# Patient Record
Sex: Male | Born: 1945
Health system: Southern US, Community
[De-identification: ages and names within clinical notes are randomized; demographics above are authoritative.]

## PROBLEM LIST (undated history)

## (undated) DIAGNOSIS — E119 Type 2 diabetes mellitus without complications: Secondary | ICD-10-CM

## (undated) DIAGNOSIS — E785 Hyperlipidemia, unspecified: Secondary | ICD-10-CM

## (undated) DIAGNOSIS — E1140 Type 2 diabetes mellitus with diabetic neuropathy, unspecified: Secondary | ICD-10-CM

## (undated) DIAGNOSIS — F320 Major depressive disorder, single episode, mild: Secondary | ICD-10-CM

## (undated) DIAGNOSIS — I639 Cerebral infarction, unspecified: Secondary | ICD-10-CM

## (undated) DIAGNOSIS — M1990 Unspecified osteoarthritis, unspecified site: Secondary | ICD-10-CM

## (undated) DIAGNOSIS — G4730 Sleep apnea, unspecified: Secondary | ICD-10-CM

## (undated) DIAGNOSIS — K5903 Drug induced constipation: Secondary | ICD-10-CM

## (undated) DIAGNOSIS — I10 Essential (primary) hypertension: Secondary | ICD-10-CM

## (undated) DIAGNOSIS — I2510 Atherosclerotic heart disease of native coronary artery without angina pectoris: Secondary | ICD-10-CM

## (undated) DIAGNOSIS — C61 Malignant neoplasm of prostate: Secondary | ICD-10-CM

## (undated) DIAGNOSIS — F419 Anxiety disorder, unspecified: Secondary | ICD-10-CM

## (undated) DIAGNOSIS — F32A Depression, unspecified: Secondary | ICD-10-CM

## (undated) DIAGNOSIS — I219 Acute myocardial infarction, unspecified: Secondary | ICD-10-CM

## (undated) DIAGNOSIS — IMO0001 Reserved for inherently not codable concepts without codable children: Secondary | ICD-10-CM

## (undated) DIAGNOSIS — Z720 Tobacco use: Secondary | ICD-10-CM

## (undated) HISTORY — PX: BACK SURGERY: SHX140

## (undated) HISTORY — PX: CARDIAC CATHETERIZATION: SHX172

## (undated) HISTORY — DX: Major depressive disorder, single episode, mild: F32.0

## (undated) HISTORY — PX: PROSTATE BIOPSY: SHX241

## (undated) HISTORY — DX: Atherosclerotic heart disease of native coronary artery without angina pectoris: I25.10

## (undated) HISTORY — DX: Morbid (severe) obesity due to excess calories: E66.01

## (undated) HISTORY — DX: Essential (primary) hypertension: I10

## (undated) HISTORY — DX: Hyperlipidemia, unspecified: E78.5

## (undated) HISTORY — DX: Depression, unspecified: F32.A

## (undated) HISTORY — DX: Cerebral infarction, unspecified: I63.9

## (undated) HISTORY — PX: VASECTOMY: SHX75

## (undated) HISTORY — DX: Tobacco use: Z72.0

## (undated) HISTORY — DX: Malignant neoplasm of prostate: C61

## (undated) HISTORY — DX: Type 2 diabetes mellitus without complications: E11.9

---

## 2000-04-20 HISTORY — PX: LUMBAR SPINE SURGERY: SHX701

## 2000-08-05 ENCOUNTER — Encounter: Payer: Self-pay | Admitting: Neurological Surgery

## 2000-08-09 ENCOUNTER — Inpatient Hospital Stay (HOSPITAL_COMMUNITY): Admission: RE | Admit: 2000-08-09 | Discharge: 2000-08-10 | Payer: Self-pay | Admitting: Neurological Surgery

## 2000-08-09 ENCOUNTER — Encounter: Payer: Self-pay | Admitting: Neurological Surgery

## 2000-09-03 ENCOUNTER — Encounter: Payer: Self-pay | Admitting: Neurological Surgery

## 2000-09-03 ENCOUNTER — Encounter: Admission: RE | Admit: 2000-09-03 | Discharge: 2000-09-03 | Payer: Self-pay | Admitting: Neurological Surgery

## 2004-02-26 ENCOUNTER — Ambulatory Visit (HOSPITAL_COMMUNITY): Admission: RE | Admit: 2004-02-26 | Discharge: 2004-02-26 | Payer: Self-pay | Admitting: *Deleted

## 2004-02-26 ENCOUNTER — Ambulatory Visit: Payer: Self-pay | Admitting: *Deleted

## 2004-03-11 ENCOUNTER — Ambulatory Visit: Payer: Self-pay | Admitting: *Deleted

## 2004-03-25 ENCOUNTER — Ambulatory Visit (HOSPITAL_COMMUNITY): Admission: RE | Admit: 2004-03-25 | Discharge: 2004-03-25 | Payer: Self-pay | Admitting: *Deleted

## 2004-03-31 ENCOUNTER — Ambulatory Visit: Payer: Self-pay | Admitting: *Deleted

## 2004-04-01 ENCOUNTER — Inpatient Hospital Stay (HOSPITAL_BASED_OUTPATIENT_CLINIC_OR_DEPARTMENT_OTHER): Admission: RE | Admit: 2004-04-01 | Discharge: 2004-04-01 | Payer: Self-pay | Admitting: *Deleted

## 2004-04-04 ENCOUNTER — Ambulatory Visit (HOSPITAL_COMMUNITY): Admission: RE | Admit: 2004-04-04 | Discharge: 2004-04-05 | Payer: Self-pay | Admitting: Cardiology

## 2004-04-04 ENCOUNTER — Ambulatory Visit: Payer: Self-pay | Admitting: Cardiology

## 2005-02-27 ENCOUNTER — Inpatient Hospital Stay (HOSPITAL_COMMUNITY): Admission: EM | Admit: 2005-02-27 | Discharge: 2005-03-02 | Payer: Self-pay | Admitting: Emergency Medicine

## 2005-02-27 ENCOUNTER — Ambulatory Visit: Payer: Self-pay | Admitting: Internal Medicine

## 2005-03-05 ENCOUNTER — Ambulatory Visit: Payer: Self-pay | Admitting: *Deleted

## 2005-03-05 ENCOUNTER — Ambulatory Visit (HOSPITAL_COMMUNITY): Admission: RE | Admit: 2005-03-05 | Discharge: 2005-03-05 | Payer: Self-pay | Admitting: *Deleted

## 2005-03-16 ENCOUNTER — Ambulatory Visit: Payer: Self-pay | Admitting: *Deleted

## 2006-02-17 ENCOUNTER — Ambulatory Visit: Payer: Self-pay | Admitting: Cardiology

## 2006-08-26 ENCOUNTER — Ambulatory Visit: Payer: Self-pay | Admitting: Cardiology

## 2006-09-28 ENCOUNTER — Encounter (INDEPENDENT_AMBULATORY_CARE_PROVIDER_SITE_OTHER): Payer: Self-pay | Admitting: *Deleted

## 2006-09-28 LAB — CONVERTED CEMR LAB
ALT: 10 units/L
AST: 15 units/L
Albumin: 4.2 g/dL
Alkaline Phosphatase: 66 units/L
BUN: 13 mg/dL
CO2: 24 meq/L
Calcium: 8.8 mg/dL
Chloride: 103 meq/L
Cholesterol: 187 mg/dL
Creatinine, Ser: 0.96 mg/dL
Glucose, Bld: 92 mg/dL
HDL: 49 mg/dL
LDL Cholesterol: 123 mg/dL
Potassium: 4.5 meq/L
Sodium: 140 meq/L
TSH: 1.184 microintl units/mL
Total Protein: 6.9 g/dL
Triglycerides: 74 mg/dL

## 2006-12-22 ENCOUNTER — Ambulatory Visit (HOSPITAL_COMMUNITY): Admission: RE | Admit: 2006-12-22 | Discharge: 2006-12-22 | Payer: Self-pay | Admitting: Urology

## 2006-12-29 ENCOUNTER — Encounter (INDEPENDENT_AMBULATORY_CARE_PROVIDER_SITE_OTHER): Payer: Self-pay | Admitting: *Deleted

## 2006-12-29 ENCOUNTER — Ambulatory Visit (HOSPITAL_COMMUNITY): Admission: RE | Admit: 2006-12-29 | Discharge: 2006-12-29 | Payer: Self-pay | Admitting: Urology

## 2007-01-12 ENCOUNTER — Ambulatory Visit: Payer: Self-pay | Admitting: Cardiology

## 2007-02-16 ENCOUNTER — Encounter: Payer: Self-pay | Admitting: Urology

## 2007-02-19 HISTORY — PX: PROSTATECTOMY: SHX69

## 2007-02-21 ENCOUNTER — Encounter (INDEPENDENT_AMBULATORY_CARE_PROVIDER_SITE_OTHER): Payer: Self-pay | Admitting: Urology

## 2007-02-21 ENCOUNTER — Inpatient Hospital Stay (HOSPITAL_COMMUNITY): Admission: RE | Admit: 2007-02-21 | Discharge: 2007-02-23 | Payer: Self-pay | Admitting: Urology

## 2008-05-22 ENCOUNTER — Encounter: Payer: Self-pay | Admitting: Nurse Practitioner

## 2008-05-29 ENCOUNTER — Ambulatory Visit (HOSPITAL_COMMUNITY): Admission: RE | Admit: 2008-05-29 | Discharge: 2008-05-29 | Payer: Self-pay | Admitting: Internal Medicine

## 2008-06-28 DIAGNOSIS — Z951 Presence of aortocoronary bypass graft: Secondary | ICD-10-CM | POA: Insufficient documentation

## 2008-06-28 HISTORY — PX: CORONARY ARTERY BYPASS GRAFT: SHX141

## 2008-07-16 ENCOUNTER — Encounter: Admission: RE | Admit: 2008-07-16 | Discharge: 2008-07-16 | Payer: Self-pay | Admitting: Internal Medicine

## 2009-01-17 ENCOUNTER — Encounter: Payer: Self-pay | Admitting: Nurse Practitioner

## 2009-02-22 ENCOUNTER — Encounter (INDEPENDENT_AMBULATORY_CARE_PROVIDER_SITE_OTHER): Payer: Self-pay | Admitting: *Deleted

## 2009-03-21 ENCOUNTER — Ambulatory Visit: Payer: Self-pay | Admitting: Gastroenterology

## 2009-03-21 DIAGNOSIS — R1084 Generalized abdominal pain: Secondary | ICD-10-CM | POA: Insufficient documentation

## 2009-03-21 DIAGNOSIS — C61 Malignant neoplasm of prostate: Secondary | ICD-10-CM | POA: Insufficient documentation

## 2009-04-02 ENCOUNTER — Telehealth: Payer: Self-pay | Admitting: Gastroenterology

## 2009-04-05 ENCOUNTER — Encounter (INDEPENDENT_AMBULATORY_CARE_PROVIDER_SITE_OTHER): Payer: Self-pay | Admitting: *Deleted

## 2009-04-08 ENCOUNTER — Ambulatory Visit: Payer: Self-pay | Admitting: Cardiology

## 2009-04-08 DIAGNOSIS — I2510 Atherosclerotic heart disease of native coronary artery without angina pectoris: Secondary | ICD-10-CM | POA: Insufficient documentation

## 2009-04-08 DIAGNOSIS — E785 Hyperlipidemia, unspecified: Secondary | ICD-10-CM | POA: Insufficient documentation

## 2009-04-08 DIAGNOSIS — R42 Dizziness and giddiness: Secondary | ICD-10-CM | POA: Insufficient documentation

## 2009-04-08 DIAGNOSIS — F17200 Nicotine dependence, unspecified, uncomplicated: Secondary | ICD-10-CM | POA: Insufficient documentation

## 2009-04-16 ENCOUNTER — Ambulatory Visit: Payer: Self-pay | Admitting: Gastroenterology

## 2009-04-22 ENCOUNTER — Encounter: Payer: Self-pay | Admitting: Gastroenterology

## 2010-03-28 ENCOUNTER — Encounter (INDEPENDENT_AMBULATORY_CARE_PROVIDER_SITE_OTHER): Payer: Self-pay | Admitting: *Deleted

## 2010-05-10 ENCOUNTER — Encounter: Payer: Self-pay | Admitting: *Deleted

## 2010-05-11 ENCOUNTER — Encounter: Payer: Self-pay | Admitting: Internal Medicine

## 2010-05-20 NOTE — Assessment & Plan Note (Signed)
Summary: SCREEN FOR COLON/PT ON PLAVIX/YF    History of Present Illness Visit Type: Initial Consult Primary GI MD: Melvia Heaps MD [facility] Primary Provider: Elfredia Nevins, MD Chief Complaint: c/o lower abdominal pain and constipation needs screening colonoscopy  on Plavix History of Present Illness:   Here for CRC screening. 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 Followed by Dr. Dietrich Pates in [place].  Had had recent chaneg in bowels. Used to one formed stool daily then over last month developed constipation defined as decreased urge. Recently went seven days without BM, took laxative and BMs back to baseline.  For two months having  burning across lower abdomen.  Usually occurs in evening when at rest, it is iintermittent throughoutt the night and has awoken him from sleep. Urinating okay.  Pain not related to meals or defecation.     GI Review of Systems    Reports abdominal pain and  weight gain.     Location of  Abdominal pain: lower abdomen.    Denies acid reflux, belching, bloating, chest pain, dysphagia with liquids, dysphagia with solids, heartburn, loss of appetite, nausea, vomiting, vomiting blood, and  weight loss.      Reports change in bowel habits.     Denies anal fissure, black tarry stools, constipation, diarrhea, diverticulosis, fecal incontinence, heme positive stool, hemorrhoids, irritable bowel syndrome, jaundice, light color stool, liver problems, rectal bleeding, and  rectal pain.    Current Medications (verified): 1)  Aspirin 325 Mg Tabs (Aspirin) .Marland Kitchen.. 1 By Mouth Once Daily 2)  Plavix 75 Mg Tabs (Clopidogrel Bisulfate) .Marland Kitchen.. 1 By Mouth Once Daily 3)  Avalide 300-25 Mg Tabs (Irbesartan-Hydrochlorothiazide) .Marland Kitchen.. 1 By Mouth Once Daily 4)  Lupron(Dosage Unknown) .... Once Every Three Months  Allergies (verified): No Known Drug Allergies  Past History:  Past Medical History: Reviewed history from 03/20/2009 and no changes  required. Hypertension prostate cancer per urology Hyperlipidemia morbid obesity mild depression coronary disease  Past Surgical History: status post back surgery status post vacetectomy Prostatectomy Nov. 2008  Family History: No FH of Colon Cancer: Family History of Kidney Disease: mother  Social History: Occupation: Risk analyst Married 3 boys 1 girl Patient is a former smoker. trying to quit now Alcohol Use - yes  occasionally Illicit Drug Use - no Daily Caffeine Use  3 per day Smoking Status:  quit Drug Use:  no  Review of Systems       The patient complains of anxiety-new, cough, depression-new, and fatigue.  The patient denies allergy/sinus, anemia, arthritis/joint pain, back pain, blood in urine, breast changes/lumps, change in vision, confusion, coughing up blood, fainting, fever, headaches-new, hearing problems, heart murmur, heart rhythm changes, itching, muscle pains/cramps, night sweats, nosebleeds, shortness of breath, skin rash, sleeping problems, sore throat, swelling of feet/legs, swollen lymph glands, thirst - excessive, urination - excessive, urination changes/pain, urine leakage, vision changes, and voice change.    Vital Signs:  Patient profile:   65 year old male Height:      74 inches Weight:      276 pounds BMI:     35.56 Pulse rate:   76 / minute Pulse rhythm:   regular BP sitting:   158 / 94  (left arm)  Vitals Entered By: Milford Cage NCMA (March 21, 2009 1:36 PM)  Physical Exam  General:  Well developed, well nourished, no acute distress. Head:  Normocephalic and atraumatic. Eyes:  Conjunctiva pink, no icterus.  Mouth:  No oral lesions.  Tongue moist.  Neck:  no obvious masses  Lungs:  Clear throughout to auscultation. Heart:  Regular rate and rhythm; no murmurs, rubs,  or bruits. Abdomen:  Abdomen soft, nontender, nondistended. No obvious masses or hepatomegaly.Normal bowel sounds.  Msk:  . Extremities:  No palmar  erythema, no edema.  Neurologic:  Alert and  oriented x4;  grossly normal neurologically. Skin:  Intact without significant lesions or rashes. Cervical Nodes:  No significant cervical adenopathy. Psych:  Alert and cooperative. Normal mood and affect.   Impression & Recommendations:  Problem # 1:  CHANGE IN BOWELS (ICD-787.99)  Associated with intermittent lower abdominal burning. The patient will be scheduled for a colonoscopy with biopsies/polypectomy (if indicated).  The risks and benefits of the procedure, as well as alternatives were discussed with the patient and she agrees to proceed.  Orders: Colonoscopy (Colon)  Problem # 2:  SCREENING COLORECTAL-CANCER (ICD-V76.51)  See # 1.   Orders: Colonoscopy (Colon)  Problem # 3:  CORONARY ARTERY DISEASE (ICD-414.00) Hx MI / Stents. four years ago. On Plavix. The patient will be scheduled for a colonoscopy with biopsies/polypectomy (if indicated).  The risks and benefits of the procedure, as well as alternatives were discussed with the patient and he agrees to proceed. Will contact cardiologist regarding Plavik and whether it can  be held.   Problem # 4:  DIZZINESS (ICD-780.4) Several week history of dizziness when moving between lying and sitting position. No chest pains or SOB. I have asked patient to contact PCP for further evaluation, I will forward my note to him as well.   Problem # 5:  ADENOCARCINOMA, PROSTATE (ICD-185) Prostatectomy 2008   Patient Instructions: 1)  Your Colonoscopy is scheduled for 04/16/2009 at 3pm 2)  You can pick up your MoviPrep today from your pharmacy 3)  We will contact Dr Polvadera Bing about coming off your Plavix 7 days prior to your procedure 4)  The medication list was reviewed and reconciled.  All changed / newly prescribed medications were explained.  A complete medication list was provided to the patient / caregiver. Prescriptions: MOVIPREP 100 GM  SOLR (PEG-KCL-NACL-NASULF-NA ASC-C) As per  prep instructions.  #1 x 0   Entered by:   Merri Ray CMA (AAMA)   Authorized by:   Louis Meckel MD   Signed by:   Merri Ray CMA (AAMA) on 03/21/2009   Method used:   Electronically to        [facility]  [phone]* (retail)       [address] [place], [place]  [address]       Ph: [phone] or [phone]       Fax: [phone]   RxID:   [id number]

## 2010-05-20 NOTE — Procedures (Signed)
Summary: Colonoscopy  Patient: [name] Note: All result statuses are Final unless otherwise noted.  Tests: (1) Colonoscopy (COL)   COL Colonoscopy           DONE (C)     [facility]     [address]     [place], [place]  [address]           COLONOSCOPY PROCEDURE REPORT           PATIENT:  [name]  MR#:  [id number]     BIRTHDATE:  11-29-1945, 63 yrs. old  GENDER:  male           ENDOSCOPIST:  Barbette Hair. Arlyce Dice, MD     Referred by:           PROCEDURE DATE:  04/16/2009     PROCEDURE:  Colonoscopy with snare polypectomy     ASA CLASS:  ClassII     INDICATIONS:  Routine Risk Screening           MEDICATIONS:   Fentanyl 100 mcg IV, Versed 10 mg IV           DESCRIPTION OF PROCEDURE:   After the risks benefits and     alternatives of the procedure were thoroughly explained, informed     consent was obtained.  Digital rectal exam was performed and     revealed no abnormalities.   The LB [id number] endoscope     was introduced through the anus and advanced to the cecum, which     was identified by the ileocecal valve, without limitations.  The     quality of the prep was good, using MoviPrep.  The instrument was     then slowly withdrawn as the colon was fully examined.     <<PROCEDUREIMAGES>>           FINDINGS:  A sessile polyp was found in the ascending colon. It     was 3 mm in size. Polyp was snared without cautery. Retrieval was     successful (see image6). snare polyp  Mild diverticulosis was     found in the sigmoid colon (see image1 and image14).  Internal     hemorrhoids were found (see image17).  This was otherwise a normal     examination of the colon (see image2, image4, image5, image9,     image10, image15, and image16).   Retroflexed views in the rectum     revealed no abnormalities.    The scope was then withdrawn from     the patient and the procedure completed.           COMPLICATIONS:  None           ENDOSCOPIC IMPRESSION:   1) 3 mm sessile polyp in the ascending colon     2) Moderate diverticulosis in the sigmoid colon     3) Internal hemorrhoids     4) Otherwise normal examination     RECOMMENDATIONS:     1) If the polyp(s) removed today are proven to be adenomatous     (pre-cancerous) polyps, you will need a repeat colonoscopy in 5     years. Otherwise you should continue to follow colorectal cancer     screening guidelines for "routine risk" patients with colonoscopy     in 10 years.     2) resume Plavix in 1 week     3) fiber supplementation daily     4) office visit 4-6  weeks           REPEAT EXAM: You will receive a letter from Dr. Arlyce Dice in 1-2     weeks, after reviewing the final pathology, with followup     recommendations.           ______________________________     Barbette Hair Arlyce Dice, MD           CC:  Elfredia Nevins, MD           n.     REVISED:  04/22/2009 02:22 PM     eSIGNED:   Barbette Hair. Annistyn Depass at 04/22/2009 02:22 PM           [name], [id number]  Note: An exclamation mark (!) indicates a result that was not dispersed into the flowsheet. Document Creation Date: 04/22/2009 2:22 PM _______________________________________________________________________  (1) Order result status: Final Collection or observation date-time: 04/16/2009 15:36 Requested date-time:  Receipt date-time:  Reported date-time:  Referring Physician:   Ordering Physician: Melvia Heaps [phone]) Specimen Source:  Source: Launa Grill Order Number: [id number] Lab site:   Appended Document: Colonoscopy     Procedures Next Due Date:    Colonoscopy: 03/2014

## 2010-05-20 NOTE — Progress Notes (Signed)
Summary: coming off plavix   Phone Note Outgoing Call Call back at Work Phone [phone]   Call placed by: Merri Ray CMA Duncan Dull),  April 02, 2009 8:51 AM Summary of Call: Called pt to inform that he needed to contact Dr Dietrich Pates to schedule a office appointment so he can come off his plavix before his procedure on Dec 28. Pt stated he would contact doctors office and call us back Initial call taken by: Merri Ray CMA Duncan Dull),  April 02, 2009 8:52 AM  Follow-up for Phone Call        Pt has an appointment with Dr Dietrich Pates on Monday at 3pm to follow up his Plavix. Will contact me on Monday to follow up. Follow-up by: Merri Ray CMA Duncan Dull),  April 05, 2009 4:34 PM

## 2010-05-20 NOTE — Letter (Signed)
Summary: Patient Notice- Polyp Results  [facility]  [address] [place], [place] [address]   Phone: [phone]  Fax: [phone]        April 22, 2009 MRN: [id number]    [name] [address] [place], [place]  [address]    Dear [name],  I am pleased to inform you that the colon polyp(s) removed during your recent colonoscopy was (were) found to be benign (no cancer detected) upon pathologic examination.  I recommend you have a repeat colonoscopy examination in 5_ years to look for recurrent polyps, as having colon polyps increases your risk for having recurrent polyps or even colon cancer in the future.  Should you develop new or worsening symptoms of abdominal pain, bowel habit changes or bleeding from the rectum or bowels, please schedule an evaluation with either your primary care physician or with me.  Additional information/recommendations:  __ No further action with gastroenterology is needed at this time. Please      follow-up with your primary care physician for your other healthcare      needs.  __ Please call [phone] to schedule a return visit to review your      situation.  __ Please keep your follow-up visit as already scheduled.  _x_ Continue treatment plan as outlined the day of your exam.  Please call us if you are having persistent problems or have questions about your condition that have not been fully answered at this time.  Sincerely,  Louis Meckel MD  This letter has been electronically signed by your physician.  Appended Document: Patient Notice- Polyp Results Letter mailed 01.04.11

## 2010-05-20 NOTE — Letter (Signed)
Summary: [facility]   Imported By: Lester [place] 03/27/2009 07:50:34  _____________________________________________________________________  External Attachment:    Type:   Image     Comment:   External Document

## 2010-05-20 NOTE — Letter (Signed)
Summary: [facility]   Imported By: Lester [place] 03/27/2009 07:54:13  _____________________________________________________________________  External Attachment:    Type:   Image     Comment:   External Document

## 2010-05-20 NOTE — Miscellaneous (Signed)
Summary: LABS CMP ,LIPID,TSH, 09/28/2006  Clinical Lists Changes  Observations: Added new observation of CALCIUM: 8.8 mg/dL (21/30/8657 8:46) Added new observation of ALBUMIN: 4.2 g/dL (96/29/5284 1:32) Added new observation of PROTEIN, TOT: 6.9 g/dL (44/04/270 5:36) Added new observation of SGPT (ALT): 10 units/L (09/28/2006 9:17) Added new observation of SGOT (AST): 15 units/L (09/28/2006 9:17) Added new observation of ALK PHOS: 66 units/L (09/28/2006 9:17) Added new observation of CREATININE: 0.96 mg/dL (64/40/3474 2:59) Added new observation of BUN: 13 mg/dL (56/38/7564 3:32) Added new observation of BG RANDOM: 92 mg/dL (95/18/8416 6:06) Added new observation of CO2 PLSM/SER: 24 meq/L (09/28/2006 9:17) Added new observation of CL SERUM: 103 meq/L (09/28/2006 9:17) Added new observation of K SERUM: 4.5 meq/L (09/28/2006 9:17) Added new observation of NA: 140 meq/L (09/28/2006 9:17) Added new observation of LDL: 123 mg/dL (30/16/0109 3:23) Added new observation of HDL: 49 mg/dL (55/73/2202 5:42) Added new observation of TRIGLYC TOT: 74 mg/dL (70/62/3762 8:31) Added new observation of CHOLESTEROL: 187 mg/dL (51/76/1607 3:71) Added new observation of TSH: 1.184 microintl units/mL (09/28/2006 9:17)

## 2010-05-20 NOTE — Letter (Signed)
Summary: [facility] Instructions  [facility]  [address] [place], [place] [address]   Phone: [phone]  Fax: [phone]       [name]    [birth year]    MRN: [id number]        Procedure Day /Date:TUESDAY 04/16/2009     Arrival Time:2PM     Procedure Time:3PM     Location of Procedure:                    [facility] (4th Floor)                        PREPARATION FOR COLONOSCOPY WITH MOVIPREP   Starting 5 days prior to your procedure12/23/2010 do not eat nuts, seeds, popcorn, corn, beans, peas,  salads, or any raw vegetables.  Do not take any fiber supplements (e.g. Metamucil, Citrucel, and Benefiber).  THE DAY BEFORE YOUR PROCEDURE         DATE: 04/15/2009  DAY: MONDAY  1.  Drink clear liquids the entire day-NO SOLID FOOD  2.  Do not drink anything colored red or purple.  Avoid juices with pulp.  No orange juice.  3.  Drink at least 64 oz. (8 glasses) of fluid/clear liquids during the day to prevent dehydration and help the prep work efficiently.  CLEAR LIQUIDS INCLUDE: Water Jello Ice Popsicles Tea (sugar ok, no milk/cream) Powdered fruit flavored drinks Coffee (sugar ok, no milk/cream) Gatorade Juice: apple, white grape, white cranberry  Lemonade Clear bullion, consomm, broth Carbonated beverages (any kind) Strained chicken noodle soup Hard Candy                             4.  In the morning, mix first dose of MoviPrep solution:    Empty 1 Pouch A and 1 Pouch B into the disposable container    Add lukewarm drinking water to the top line of the container. Mix to dissolve    Refrigerate (mixed solution should be used within 24 hrs)  5.  Begin drinking the prep at 5:00 p.m. The MoviPrep container is divided by 4 marks.   Every 15 minutes drink the solution down to the next mark (approximately 8 oz) until the full liter is complete.   6.  Follow completed prep with 16 oz of clear liquid of your choice (Nothing red  or purple).  Continue to drink clear liquids until bedtime.  7.  Before going to bed, mix second dose of MoviPrep solution:    Empty 1 Pouch A and 1 Pouch B into the disposable container    Add lukewarm drinking water to the top line of the container. Mix to dissolve    Refrigerate  THE DAY OF YOUR PROCEDURE      DATE: 04/16/2009 DAY: THURSDAY  Beginning at 10:00a.m. (5 hours before procedure):         1. Every 15 minutes, drink the solution down to the next mark (approx 8 oz) until the full liter is complete.  2. Follow completed prep with 16 oz. of clear liquid of your choice.    3. You may drink clear liquids until 1:00PM (2 HOURS BEFORE PROCEDURE).   MEDICATION INSTRUCTIONS  Unless otherwise instructed, you should take regular prescription medications with a small sip of water   as early as possible the morning of your procedure.  Diabetic patients - see separate instructions.  Stop taking Plavix or Aggrenox on 04/09/2009(7 days before procedure).     You will be contacted by our office prior to your procedure for directions on holding your Plavix.  If you do not hear from our office 1 week prior to your scheduled procedure, please call [phone] to discuss.       OTHER INSTRUCTIONS  You will need a responsible adult at least 65 years of age to accompany you and drive you home.   This person must remain in the waiting room during your procedure.  Wear loose fitting clothing that is easily removed.  Leave jewelry and other valuables at home.  However, you may wish to bring a book to read or  an iPod/MP3 player to listen to music as you wait for your procedure to start.  Remove all body piercing jewelry and leave at home.  Total time from sign-in until discharge is approximately 2-3 hours.  You should go home directly after your procedure and rest.  You can resume normal activities the  day after your procedure.  The day of your procedure you should not:    Drive   Make legal decisions   Operate machinery   Drink alcohol   Return to work  You will receive specific instructions about eating, activities and medications before you leave.    The above instructions have been reviewed and explained to me by   _______________________    I fully understand and can verbalize these instructions _____________________________ Date _________

## 2010-05-20 NOTE — Letter (Signed)
Summary: New Patient letter  [facility]  [address] [place], [place] [address]   Phone: [phone]  Fax: [phone]       02/22/2009 MRN: [id number]  [name] [address] [place], [place]  [address]  Dear [name],  Welcome to the Gastroenterology Division at [facility].    You are scheduled to see Dr.  Arlyce Dice on 03-21-2009 at 1:30pm on the 3rd floor at [facility], [address]  We ask that you try to arrive at our office 15 minutes prior to your appointment time to allow for check-in.  We would like you to complete the enclosed self-administered evaluation form prior to your visit and bring it with you on the day of your appointment.  We will review it with you.  Also, please bring a complete list of all your medications or, if you prefer, bring the medication bottles and we will list them.  Please bring your insurance card so that we may make a copy of it.  If your insurance requires a referral to see a specialist, please bring your referral form from your primary care physician.  Co-payments are due at the time of your visit and may be paid by cash, check or credit card.     Your office visit will consist of a consult with your physician (includes a physical exam), any laboratory testing he/she may order, scheduling of any necessary diagnostic testing (e.g. x-ray, ultrasound, CT-scan), and scheduling of a procedure (e.g. Endoscopy, Colonoscopy) if required.  Please allow enough time on your schedule to allow for any/all of these possibilities.    If you cannot keep your appointment, please call [phone] to cancel or reschedule prior to your appointment date.  This allows [place] the opportunity to schedule an appointment for another patient in need of care.  If you do not cancel or reschedule by 5 p.m. the business day prior to your appointment date, you will be charged a $50.00 late cancellation/no-show fee.    Thank you for choosing   Gastroenterology for your medical needs.  We appreciate the opportunity to care for you.  Please visit [place] at our website  to learn more about our practice.                     Sincerely,                                                             The Gastroenterology Division

## 2010-05-20 NOTE — Letter (Signed)
Summary: Appointment - Reminder 2  [facility] [address]   [place], [place] [address]   Phone: [phone]  Fax: [phone]     March 28, 2010 MRN: [id number]   [name] [address] [place], [place]  [address]   Dear [name],  Our records indicate that it is time to schedule a follow-up appointment.  Dr.ROTHBART             recommended that you follow up with [place] in  12.2011          . It is very important that we reach you to schedule this appointment. We look forward to participating in your health care needs. Please contact us at the number listed above at your earliest convenience to schedule your appointment.  If you are unable to make an appointment at this time, give [place] a call so we can update our records.     Sincerely,   Glass blower/designer

## 2010-05-20 NOTE — Assessment & Plan Note (Signed)
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  He required radical prostatectomy for carcinoma in 2008 and has been maintained on Lupron.  He has noted fatigue and weight gain, which she attributes to that medication.  He walks 2 miles per day, but has intermittent mild dyspnea on related to exertion.  He has had no chest discomfort.  He is not taking atorvastatin, but does not recall who, if anyone, discontinued it.  He continues to smoke cigarettes, but claims a consumption of only 2 cigarettes per day.  He notes dizziness when changing position.  This sounds like vertigo, but could represent lightheadedness as well.  Blood pressure control has been suboptimal.   Current Medications (verified): 1)  Aspirin 325 Mg Tabs (Aspirin) .Marland Kitchen.. 1 By Mouth Once Daily 2)  Plavix 75 Mg Tabs (Clopidogrel Bisulfate) .Marland Kitchen.. 1 By Mouth Once Daily 3)  Avalide 300-25 Mg Tabs (Irbesartan-Hydrochlorothiazide) .Marland Kitchen.. 1 By Mouth Once Daily 4)  Lupron(Dosage Unknown) .... Once Every Three Months 5)  Simvastatin 40 Mg Tabs (Simvastatin) .... Take One Tablet By Mouth Daily At Bedtime 6)  Amlodipine Besylate 5 Mg Tabs (Amlodipine Besylate) .... Take One Tablet By Mouth Daily  Allergies (verified): No Known Drug Allergies  Past History:  PMH, FH, and Social History reviewed and updated.  Review of Systems       The patient complains of weight gain.  The patient denies anorexia, fever, chest pain, syncope, peripheral edema,  prolonged cough, headaches, and abdominal pain.    Vital Signs:  Patient profile:   65 year old male Weight:      273 pounds Pulse rate:   79 / minute Pulse (ortho):   82 / minute BP sitting:   160 / 87  (right arm) BP standing:   161 / 98  Vitals Entered By: Dreama Saa, CNA (April 08, 2009 3:44 PM)  Serial Vital Signs/Assessments:  Time      Position  BP       Pulse  Resp  Temp     By 5:17 PM   Lying LA  157/89   73                    Tammy Sanders RN 5:17 PM   Sitting   153/90   76                    Tammy Sanders RN 5:17 PM   Standing  161/98   82                    Tammy Sanders RN   Physical Exam  General:   General-Well developed; no acute distress; overweight   Neck-No JVD; no carotid bruits Lungs-No tachypnea, no rales; no rhonchi; no wheezes: Cardiovascular-normal PMI; normal S1 and S2; S4 present Abdomen-BS normal; soft and non-tender without masses or organomegaly:  Musculoskeletal-No deformities, no cyanosis or clubbing: Neurologic-Normal cranial nerves; symmetric strength and tone:  Skin-Warm, no significant lesions: Extremities-Nl distal pulses; no edema:  Impression & Recommendations:  Problem # 1:  ATHEROSCLEROTIC CARDIOVASCULAR DISEASE (ICD-429.2) Currently no symptoms to suggest recurrent myocardial ischemia.   We will attempt to optimize his risk factor management.  Problem # 2:  HYPERLIPIDEMIA (ICD-272.4) With a history of coronary disease, treatment with a statin is virtually mandatory.  His last LDL was suboptimal at 123.  Simvastatin 40 mg q.d. will be added to his medical regime.  Problem # 3:  TOBACCO ABUSE (ICD-305.1) He claims minimal consumption of tobacco; however, abstinence would be preferable as discussed with him.  Problem # 4:  HYPERTENSION (ICD-401.1) Control of blood pressure has been lost, likely due to a 35 pound weight gain.  Amlodipine 5 mg q.d. will be added to his medical regime.  Problem # 5:  INTERMITTENT VERTIGO  (ICD-780.4) Orthostatic vital signs are normal-I doubt that the patient's symptoms reflect cerebral hypoperfusion.  He has been referred to an ENT specialist for further evaluation.  Problem # 6:  ADENOCARCINOMA, PROSTATE (ICD-185) Lupron is to be discontinued, which hopefully will allow for some weight loss.  This would be beneficial for his fatigue, dyspnea, hypertension and hyperlipidemia.  I will plan to reassess this nice gentleman in one year.  Patient Instructions: 1)  Your physician recommends that you schedule a follow-up appointment in: 1 YEAR 2)  Your physician has recommended you make the following change in your medication: START SIMVASTATIN 40MG  DAILY AND AMLODINPINE 5MG  DAILY 3)  Your physician encouraged you to lose weight for better health. 4)  You have been referred to ENT  REFERRAL-VERTIGO Prescriptions: AMLODIPINE BESYLATE 5 MG TABS (AMLODIPINE BESYLATE) Take one tablet by mouth daily  #90 x 3   Entered by:   Teressa Lower RN   Authorized by:   Kathlen Brunswick, MD, [facility]   Signed by:   Teressa Lower RN on 04/08/2009   Method used:   Electronically to        [facility]  [phone]* (retail)       [address] [place], [place]  [address]       Ph: [phone] or [phone]       Fax: [phone]   RxID:   [id number] SIMVASTATIN 40 MG TABS (SIMVASTATIN) Take one tablet by mouth daily at bedtime  #90 x 3   Entered by:   Teressa Lower RN   Authorized by:   Kathlen Brunswick, MD, [facility]   Signed by:   Teressa Lower RN on 04/08/2009   Method used:   Electronically to        [facility]  [phone]* (retail)       [address] [place], [place]  [address]       Ph: [phone] or [phone]       Fax: [phone]   RxID:   [id number]

## 2010-05-20 NOTE — Letter (Signed)
Summary: Anticoagulation Modification Letter  [facility]  [address] [place], [place] [address]   Phone: [phone]  Fax: [phone]    March 21, 2009  Re:    Daleen Bo DOB:    12-14-1945 MRN:    [id number]    Dear Dr Dietrich Pates:  We have scheduled the above patient for an endoscopic procedure. Our records show that  he/she is on anticoagulation therapy. Please advise as to how long the patient may come off their therapy of Plavix prior to the scheduled procedure(s) on 04/16/2009   Please fax back/or route the completed form to Robin at [phone]_.  Thank you for your help with this matter.  Sincerely,  Merri Ray CMA Duncan Dull)   Physician Recommendation:  Hold Plavix 7 days prior ________________  Hold Coumadin 5 days prior ____________  Other ______________________________     Appended Document: Anticoagulation Modification Letter Dr Dietrich Pates,  I know you signed the plavix letter but can you append on my note and actually type that its ok for the pt to come off his Plavix. Thank You  Appended Document: Anticoagulation Modification Letter I send Dr. Arlyce Dice and you a flag indicating that I have not seen this patient for 2 years and would need to evaluate him prior to answering this question.  [place] Bing, M.D.

## 2010-06-24 ENCOUNTER — Emergency Department (HOSPITAL_COMMUNITY)
Admission: EM | Admit: 2010-06-24 | Discharge: 2010-06-24 | Disposition: A | Discharge status: Home / Self Care | Payer: Managed Care, Other (non HMO) | Attending: Emergency Medicine | Admitting: Emergency Medicine

## 2010-06-24 DIAGNOSIS — Z951 Presence of aortocoronary bypass graft: Secondary | ICD-10-CM | POA: Insufficient documentation

## 2010-06-24 DIAGNOSIS — Z794 Long term (current) use of insulin: Secondary | ICD-10-CM | POA: Insufficient documentation

## 2010-06-24 DIAGNOSIS — E119 Type 2 diabetes mellitus without complications: Secondary | ICD-10-CM | POA: Insufficient documentation

## 2010-06-24 DIAGNOSIS — Z79899 Other long term (current) drug therapy: Secondary | ICD-10-CM | POA: Insufficient documentation

## 2010-06-24 DIAGNOSIS — I2510 Atherosclerotic heart disease of native coronary artery without angina pectoris: Secondary | ICD-10-CM | POA: Insufficient documentation

## 2010-06-24 DIAGNOSIS — I10 Essential (primary) hypertension: Secondary | ICD-10-CM | POA: Insufficient documentation

## 2010-06-24 DIAGNOSIS — R42 Dizziness and giddiness: Secondary | ICD-10-CM | POA: Insufficient documentation

## 2010-06-24 DIAGNOSIS — R55 Syncope and collapse: Secondary | ICD-10-CM | POA: Insufficient documentation

## 2010-06-24 DIAGNOSIS — Z7982 Long term (current) use of aspirin: Secondary | ICD-10-CM | POA: Insufficient documentation

## 2010-06-24 LAB — DIFFERENTIAL
Basophils Absolute: 0 10*3/uL (ref 0.0–0.1)
Basophils Relative: 1 % (ref 0–1)
Eosinophils Absolute: 0 10*3/uL (ref 0.0–0.7)
Eosinophils Relative: 0 % (ref 0–5)
Lymphocytes Relative: 27 % (ref 12–46)
Lymphs Abs: 1.6 10*3/uL (ref 0.7–4.0)
Monocytes Absolute: 0.5 10*3/uL (ref 0.1–1.0)
Monocytes Relative: 9 % (ref 3–12)
Neutro Abs: 3.6 10*3/uL (ref 1.7–7.7)
Neutrophils Relative %: 62 % (ref 43–77)

## 2010-06-24 LAB — URINALYSIS, ROUTINE W REFLEX MICROSCOPIC
Bilirubin Urine: NEGATIVE
Glucose, UA: NEGATIVE mg/dL
Hgb urine dipstick: NEGATIVE
Ketones, ur: NEGATIVE mg/dL
Nitrite: NEGATIVE
Protein, ur: NEGATIVE mg/dL
Specific Gravity, Urine: 1.012 (ref 1.005–1.030)
Urobilinogen, UA: 0.2 mg/dL (ref 0.0–1.0)
pH: 7.5 (ref 5.0–8.0)

## 2010-06-24 LAB — BASIC METABOLIC PANEL
BUN: 15 mg/dL (ref 6–23)
CO2: 29 mEq/L (ref 19–32)
Calcium: 9.1 mg/dL (ref 8.4–10.5)
Chloride: 101 mEq/L (ref 96–112)
Creatinine, Ser: 0.77 mg/dL (ref 0.4–1.5)
GFR calc Af Amer: >60 mL/min (ref >60)
GFR calc non Af Amer: >60 mL/min (ref >60)
Glucose, Bld: 108 mg/dL — ABNORMAL HIGH (ref 70–99)
Potassium: 4 mEq/L (ref 3.5–5.1)
Sodium: 137 mEq/L (ref 135–145)

## 2010-06-24 LAB — CBC
HCT: 42.8 % (ref 39.0–52.0)
Hemoglobin: 14.5 g/dL (ref 13.0–17.0)
MCH: 28.8 pg (ref 26.0–34.0)
MCHC: 33.9 g/dL (ref 30.0–36.0)
MCV: 84.9 fL (ref 78.0–100.0)
Platelets: 189 10*3/uL (ref 150–400)
RBC: 5.04 MIL/uL (ref 4.22–5.81)
RDW: 13.5 % (ref 11.5–15.5)
WBC: 5.8 10*3/uL (ref 4.0–10.5)

## 2010-09-02 NOTE — Letter (Signed)
January 12, 2007    Valetta Fuller, M.D.  [address]  [place], [place] [address]   RE:  [name]  MRN:  [id number]  /  DOB:  06-21-1945   Dear Onalee Hua:   [name] was seen in the office today prior to planned surgical  prostatectomy for carcinoma.  As you know, [name] has previously  undergone implantation of a drug-eluting stent and subsequently suffered  thrombosis of that stent 11 months after implantation.  That was 2 years  ago.  Since then he has done well including 5 days off clopidogrel for  his prostate biopsy.  He has no chest pain nor dyspnea.   CURRENT MEDICATIONS:  1. Aspirin 325 mg daily.  2. Clopidogrel 75 mg daily.  3. Avalide 300/25 mg daily.   On exam, a very pleasant gentleman in no acute distress.  The weight is 238, stable.  Blood pressure 135/80, heart rate 66 and  regular, respirations 15.  NECK:  No jugular venous distention.  LUNGS:  Clear.  CARDIAC:  Normal first and second heart sounds.  ABDOMEN:  Soft and nontender.  No organomegaly.  EXTREMITIES:  Normal distal pulses.   EKG:  Normal sinus rhythm; borderline left atrial abnormality; prior  inferior-posterior myocardial infarction.  Comparison with a prior study  of February 17, 2006:  R-wave voltage in lead V1 is now more prominent.   IMPRESSION:  [name] is at risk for stent thrombosis.  The chance of  this occurring is fairly small, perhaps 1-2%.  Accordingly, since  prostatectomy appears to be the best therapy for his condition, I would  recommend proceeding with surgery.  He will remain on aspirin throughout  the preoperative and postoperative surgical periods.  Clopidogrel should  be resumed as soon as it is safe to do so.  Please call [facility] when [name] is in the hospital if our assistance is  required.    Sincerely,      Gerrit Friends. Dietrich Pates, MD, [facility]  Electronically Signed    RMR/MedQ  DD: 01/12/2007  DT: 01/13/2007  Job #:  [id number]   CC:    Madelin Rear. Sherwood Gambler, MD

## 2010-09-02 NOTE — Op Note (Signed)
NAMEBevelyn [name]             ACCOUNT NO.:  [id number]   MEDICAL RECORD NO.:  [id number]          PATIENT TYPE:  INP   LOCATION:  1432                         FACILITY:  [facility]   PHYSICIAN:  Valetta Fuller, M.D.  DATE OF BIRTH:  Jan 24, 1946   DATE OF PROCEDURE:  02/21/2007  DATE OF DISCHARGE:  02/23/2007                               OPERATIVE REPORT   PREOPERATIVE DIAGNOSIS:  Intermediate risk clinical stage T1c  adenocarcinoma of the prostate.   POSTOPERATIVE DIAGNOSIS:  Intermediate risk clinical stage T1c  adenocarcinoma of the prostate.   PROCEDURE PERFORMED:  Robotic assisted laparoscopic radical retropubic  prostatectomy with bilateral pelvic lymph node dissection.   SURGEON:  Valetta Fuller, MD   ASSISTANT:  Heloise Purpura, MD   ANESTHESIA:  General endotracheal.   INDICATIONS:  [name] is a 65 year old male who was sent to me  through the courtesy of Dr. Su Grand with intermediate risk to high  risk of clinical stage T1c adenocarcinoma of the prostate.  The patient  had a substantially elevated PSA of approximately 32.  The patient  underwent ultrasound and biopsy which showed the majority of the right-  sided biopsies positive for Gleason 4+4 equals 8 tumor involving the  right base, right mid and right apex of the prostate.  The patient  underwent extensive counseling by Dr. Brunilda Payor as well as myself about  treatment options.  He understood the high risk of microscopic disease  outside the prostate and a low likelihood of completely organ confined  disease.  The patient did have staging bone scan and CT which failed to  show gross disease outside the prostate.  The patient underwent again  multiple consultations and elected to have a robotic surgical approach.  Cardiology clearance was obtained.  It was recommended that Plavix be  discontinued but the patient remained on aspirin which he was instructed  to do.  The patient appeared to understand the  advantages, disadvantages  of the surgical approach to treatment of prostate cancer and appeared to  understand the procedure, the potential complications and full informed  consent was obtained.  The patient received perioperative Unasyn and  compression boots for DVT prophylaxis.   TECHNIQUE AND FINDINGS:  The patient was brought to the operating room  where he had successful induction of general endotracheal anesthesia.  He was placed in a moderate lithotomy position.  The patient was secured  to the table and all extremities carefully padded.  He was then placed  in a steep Trendelenburg position and then prepped and draped in the  usual manner.  An open Hasson technique was utilized to obtain abdominal  access.  The initial camera port incision was 18 cm above the pubic  symphysis just to the left of the umbilicus.  Standard techniques were  utilized and a cannula was placed after making sure there were no  obvious adhesions.  The abdomen was insufflated without incident.  The  pelvis was carefully examined with the camera and no adverse findings  were noted.  All other trocars placed with direct visual guidance.  This  included  12 mm and 5 mm assist ports and three 8 mm robotic trocars.  Once all trocars were in position the surgical cart was then docked.  A  Foley catheter had previously been placed sterilely on the field and the  bladder was filled to help identify the bladder and allow for  identification of the space of Retzius.  The bladder was dropped  posteriorly by opening up the space of Retzius utilizing electrocautery  scissors.  Once this was performed the prostate and overlying endopelvic  fascia was identified and then defatted.  Superficial dorsal vein  complex was taken down exposing the underlying endopelvic fascia of the  prostate and bladder neck regions.  The endopelvic fascia was then  incised from apex to base.  Puboprostatic ligaments were taken down.   Levator musculature was swept off the apex of the prostate identifying  the groove between the urethra and dorsal vein complex.  The dorsal vein  was then stapled with an ETS stapling device.  With the aid of the Foley  balloon the bladder neck was then identified.   Anterior bladder neck was then dissected with electrocautery scissors.  The underlying catheter was then identified.  The Foley catheter was  then retracted anteriorly.  There was no evidence of middle lobe.  Indigo carmine was given.  We were well away from the ureteral orifices.  The posterior bladder neck was then transected.  The underlying seminal  vesicles and vas deferens were then identified and individually  dissected free.  Once this was accomplished we were able to establish a  plane between the rectum and the posterior aspect of the prostate  without difficulty.  The patient was felt to be a candidate for  unilateral nerve sparing on the left side only due to the extensive  disease on the right side of his prostate.  In the posterior aspect of  the prostate the superficial fascia of the prostate was incised allowing  [place] to establish the plane between the neurovascular bundle and the  prostatic capsule.  That nerve was then preserved.  The vascular bundles  of the prostate were then identified and take down with hemoclips.  On  the right side wide excision of the bundle including it with the  specimen was accomplished.  The urethra was then transected leaving a  nice urethral stump and the prostatic specimen was removed and placed  outside the pelvis.  The pelvis was then irrigated.  No evidence of  rectal injury.   Attention was then turned towards bilateral pelvic lymph node  dissection.  Extended lymphadenectomy including the obturator packet up  to the bifurcation of the iliac arteries was performed bilaterally which  were sent separately.  Obturator nerves were identified bilaterally and  preserved and the  packets were removed utilizing hemoclips for lymphatic  channels and small veins.   Attention was then turned to reconstruction.  The bladder neck and  posterior urethra reapproximated with a 2-0 Vicryl suture.  The  anastomosis was then completed with a double-armed 3-0 Monocryl suture  utilizing a running watertight anastomosis.  At the completion of  anastomosis a new Foley catheter was placed.  Bladder irrigation  revealed no leak.  A pelvic drain was then placed through one of the  trocars and positioned in the retropubic space.  The 12 mm assist port  was closed with the zero Vicryl suture with the aid of a suture passer  under direct visual guidance.  All other ports  were removed with direct  vision.  The prostatic specimen was placed in an Endopouch.  The camera  port incision was slightly enlarged to allow for removal of the specimen  and that incision was then closed with a running zero Vicryl suture.  Skin was closed with clips.  The patient appeared to tolerate the  procedure well and had no obvious complications or difficulties.  He was  brought to the recovery room in stable condition.           ______________________________  Valetta Fuller, M.D.  Electronically Signed     DSG/MEDQ  D:  02/22/2007  T:  02/23/2007  Job:  [id number]   cc:   Lindaann Slough, M.D.  Fax: [phone]   Gerrit Friends. Dietrich Pates, MD, [facility]  [address]  [place], [place] [address]

## 2010-09-02 NOTE — Letter (Signed)
Aug 26, 2006    Madelin Rear. Sherwood Gambler, MD  [address]  [place], [place] [address]   RE:  [name]  MRN:  [id number]  /  DOB:  Dec 26, 1945   Dear [name]:   [name] returns to the office for continued assessment and treatment  of cardiovascular risk factors with known coronary disease.  From a  symptomatic standpoint, he has done beautifully.  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  He has monitored blood pressure, but failed to bring in his  list.  It sounds as if values have not been optimal.  He continues to  smoke cigarettes.  He did not obtain a lipid profile, as requested at  his last visit.   CURRENT MEDICATIONS:  Unchanged, except for the addition of amlodipine 5  mg daily.   EXAMINATION:  Laid-back pleasant gentleman.  The weight is 237, eight pounds less than in October.  The blood  pressure is 130/75.  Heart rate 68 and regular.  Respirations 16.  NECK:  No jugular venous distention.  Normal carotid upstrokes without  bruits.  LUNGS:  Clear.  CARDIAC:  Normal 1st and 2nd heart sounds.  Fourth heart sound present.  ABDOMEN:  Soft and non-tender.  No masses.  No organomegaly.  EXTREMITIES:  No edema.  Normal distal pulses.   IMPRESSION:  [name] is doing well from a symptomatic standpoint.  Control of risk factors is less good.  Since he reports blood pressures  as high as 180 systolic, we will increase his dose of Avalide to 300/25  mg daily.  A chemistry profile will be obtained today, in 1 month, and  in 6 months.  A lipid profile is pending.  [name] would benefit by  discontinuing cigarette smoking.  I will plan to see him again in 6  months.    Sincerely,      Gerrit Friends. Dietrich Pates, MD, [facility]  Electronically Signed    RMR/MedQ  DD: 08/26/2006  DT: 08/26/2006  Job #: [id number]

## 2010-09-05 NOTE — Cardiovascular Report (Signed)
NAME:  [name]             ACCOUNT NO.:  [id number]   MEDICAL RECORD NO.:  [id number]           PATIENT TYPE:   LOCATION:                                 FACILITY:   PHYSICIAN:  Arturo Morton. Riley Kill, M.D. [facility] DATE OF BIRTH:   DATE OF PROCEDURE:  DATE OF DISCHARGE:                              CARDIAC CATHETERIZATION   INDICATIONS:  [name] underwent cardiac catheterization by Dionicio Stall.  The patient had moderate disease of the LAD and a high grade tandem stenoses  of a diffusely diseased right coronary.  Percutaneous coronary intervention  was recommended.  He was brought back to the lab today after pretreatment  with Plavix.   Risks, benefits and alternatives were discussed with the patient and his  wife prior to the procedure.   PROCEDURE:  Percutaneous stenting of the right coronary artery.   DESCRIPTION OF PROCEDURE:  The patient was brought to the cath lab and  prepped and draped in the usual fashion through an anterior puncture.  The  left femoral artery was entered.  A #6 French sheath was placed.  A #7  French sheath was placed.  A JR4 with side holes was utilized to engage the  right coronary.  Angiomax was given according to protocol and an ACT checked  and was found to be appropriate.  We initially tried a high torque floppy  wire and a traverse wire.  Neither were able to cross.  We then used a long  __________ wire accompanied by a 2.25 over the wire maverick balloon which  was taken down to the distal lesion.  We were able to cross with the wire  and get the wire into the distal vessel and subsequently, able to dilate the  vessel.  Overall, this improved the appearance of the vessel.  Generous  doses of intracoronary nitroglycerin were administered because of wire  related spasm distally.  A 2.5 x 28 Cypher drug-eluting stent was then  deployed at the junction of the mid and distal vessel.  This was in the area  of the ruptured plaque.  A second  overlapping stent 3.0 x 28 Cypher drug-  eluting stent was placed in tandem.  This resulted in marked improvement in  the appearance of the artery.  The distal stent was post dilated with a 2.75  mm power cell which was brought all the way back up to the proximal stent  then the proximal stent was post dilated with the 3.25 mm power _________.  This resulted in excellent antegrade flow with marked improvement in the  appearance of the artery with the removal of the wire and intracoronary  nitroglycerin.  The distal vessel demonstrated vigorous flow.   All catheters were subsequently removed and the femoral sheath sewn into  place and he was taken to the holding area in satisfactory clinical  condition.   ANGIOGRAPHIC DATA:  The right coronary artery is a severely diseased vessel.  It has a slightly anterior takeoff and then tandem 30% stenoses.  Beyond the  tandem 30% stenoses, there is then a 90% stenosis.  This is hazy.  This is  followed by a 70% stenosis and then a subtotal occlusion.  The distal vessel  consists of a posterior descending and posterior lateral branch and there is  a fair amount of luminal irregularity in the distal vessel.  Following the  balloon dilatations, the two tandem mid stenoses in the subtotal occlusion  distally were all reduced to 0% with an excellent angiographic appearance.  There was TIMI 3 flow distally.  The procedure was felt to be successful.   CONCLUSION:  Successful percutaneous stenting of the right coronary artery  with tandem overlapping drug eluting Cypher stents.   DISPOSITION:  The patient will need to be treated with aspirin and Plavix on  a long-term basis.  He will need to have risk factor reduction with change  in habits.  I have discussed this with the patient and his wife in detail.  He will be discharged tomorrow with followup with Dr. Dorethea Clan and Dr. Sherwood Gambler  in [place].       TDS/MEDQ  D:  04/04/2004  T:  04/06/2004  Job:   [id number]   cc:   Madelin Rear. Sherwood Gambler, MD  [address]  [place]  [place] [address]  Fax: [phone]   Vida Roller, M.D.  Fax: [phone]   [facility]

## 2010-09-05 NOTE — Cardiovascular Report (Signed)
NAMEErnest [name] NO.:  [id number]   MEDICAL RECORD NO.:  [id number]          PATIENT TYPE:  INP   LOCATION:  2905                         FACILITY:  [facility]   PHYSICIAN:  Charlies Constable, M.D. [facility] DATE OF BIRTH:  November 30, 1945   DATE OF PROCEDURE:  03/02/2005  DATE OF DISCHARGE:                              CARDIAC CATHETERIZATION   CLINICAL HISTORY:  [name] is 65 years old and 11 months ago had two  overlapping Cypher stents placed in the right coronary artery by Dr.  Riley Kill.  He had been only taking his Plavix intermittently recently.  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  He had residual disease in the  proximal LAD which was felt to be 80% and was brought back today for  intervention.   PROCEDURE:  The procedure was performed via the left femoral artery using  arterial sheath.  We first performed the diagnostic study of the right  coronary artery.  We then used a Q4 6 [place] guiding catheter with side  holes and performed angiography of the LAD.  The lesion did not appear as  tight as we had previously thought and was estimated angiographically to be  somewhere between 50% and 70%.  For this reason, we decided to perform  ultrasound.  The patient was given Angiomax bolus and infusion.  We passed a  Prowater wire down the LAD and passed an Atlantis catheter distal to the  lesion site.  We then did automatic pull back.  We did not feel the lesion  was tight enough to warrant intervention.  The left femoral artery was  closed with Angio-Seal.  The patient tolerated the procedure well and left  the laboratory in satisfactory condition.   RESULTS:  The right coronary artery stents were patent with 30% stenosis in  the proximal stent and 30% stenosis proximal to the proximal edge of the  proximal stent.   The distal vessel was irregular and there was a 50% lesion  at the ostium of the posterior descending branch.   The left anterior descending artery had a lesion in its proximal portion  just before a first large septal perforator.  There was some calcification  evident.   By ultrasound, the distal reference was about 3.5 mm.  The dimensions at the  tightest point were approximately 3 mm by 2 mm.  The proximal reference was  3.4 by 4 mm.  We calculated an area of stenosis comparing the lumen of the  lesion to the lumen of the distal reference.  The minimal lumen area was 4.2  square mm and the reference lumen area in the distal vessel was 10.8 square  mm and the area of stenosis was 60%.   CONCLUSION:  Intervascular ultrasound of the lesion in the proximal left  anterior descending artery with measurements suggesting a non-flow limiting  lesion.   RECOMMENDATIONS:  Based on these findings, we will plan continued medical  therapy.  The patient  is to remain on Plavix for life in view of his recent  stent thrombosis.           ______________________________  Charlies Constable, M.D. [facility]    BB/MEDQ  D:  03/02/2005  T:  03/02/2005  Job:  [id number]

## 2010-09-05 NOTE — Cardiovascular Report (Signed)
NAME:  [name] NO.:  [id number]   MEDICAL RECORD NO.:  [id number]          PATIENT TYPE:  INP   LOCATION:  1846                         FACILITY:  [facility]   PHYSICIAN:  Charlies Constable, M.D. [facility] DATE OF BIRTH:  [birth year]   DATE OF PROCEDURE:  02/27/2005  DATE OF DISCHARGE:                              CARDIAC CATHETERIZATION   HISTORY OF PRESENT ILLNESS:  [name] is a 65 year old who has known  coronary disease and had two overlapping Cypher stents placed by Dr. Riley Kill  in December of 2005. He has done well until recently. He has intermittently  been taking his Plavix. Tonight, he developed indigestion and went to the  store and had a syncopal episode. He was brought to [facility] ER by EMS  where his ECG showed an acute diaphragmatic wall infarction. He was having  minimal pain despite the fact that he had persistent ST elevation. He was  seen by Dr. Gala Romney and brought to the catheterization lab and underwent  diagnostic study and was found to have total occlusion of the proximal right  coronary artery at the site of the first stent. He also had 80% to 90%  stenosis in the proximal LAD and moderate disease in the circumflex artery.  His overall LV function was good with inferobasilar wall hypokinesis.   PROCEDURE:  The procedure was performed via the right femoral artery using a  6 French AL1 guiding catheter with side holes. We crossed the total  occlusion at the stent in the proximal right coronary artery with a Prowater  wire without too much difficulty. We initially pre-dilated with a 2.0 x 20  mm Maverick balloon, performing 1 inflation up to 6 atmospheres by 30  seconds. This re-established flow and we could see that there was a fairly  large thrombus throughout the two stents. We used a diver aspiration  catheter and performed 3 runs and were able to aspirate a moderate amount of  thrombus. This established TIMI 3 flow. There was some mild  residual defect  in the proximal and mid portion of the stent. We went in with an Atlantis  IVUS catheter and did automatic pull-back. We were not quite able to advance  the IVUS catheter all the way past the distal stent, but we were able to  image most of the stents. The stents appeared to be fully expanded. There  appeared to be some thrombus in the mid and proximal portion of the stents.  We then went in with a 3.25 x 20 mm Quantum Maverick and performed 3  inflations, up to 16 atmospheres throughout the 3 stents. We were still not  quite satisfied with the appearance at the very proximal portion of the  proximal stent, so we went in with a 3.5 x 20 mm Quantum Maverick and  performed 2 inflations up to 16 atmospheres by 30 seconds. Final diagnostic  studies were then performed through the guiding catheter.   The patient became hypotensive and nauseated and developed some vomiting and  required Dopamine transiently, but this resolved and he tolerated the rest  of the  procedure well.   RESULTS:  Initially, the right coronary artery was totally occluded at its  proximal portion, at the proximal edge of the two overlapping Cypher stents.  Following PTCA, aspiration thrombectomy, and repeat PTCA, the stenosis  improved from 100% to less than 10% and the flow improved from TIMI zero to  TIMI 3 flow. There was also TIMI 3 blush at the end of the procedure,  compared to TIMI zero at the beginning of the procedure.   CONCLUSION:  Successful PTCA of a totally occluded proximal right coronary  artery (acute stent thrombosis) with using aspiration thrombectomy and PTCA  with improvement in _________ narrowing from 100% to less than 10% and  improvement in the flow from TIMI zero to TIMI 3 flow.   DISPOSITION:  The patient returned _________ for further observation. He  will need to stay on long term Plavix. Will plan PCI of the LAD early next  week.            ______________________________  Charlies Constable, M.D. [facility]     BB/MEDQ  D:  02/27/2005  T:  03/01/2005  Job:  [id number]   cc:   Madelin Rear. Sherwood Gambler, MD  Fax: [phone]   Vida Roller, M.D.  Fax: [phone]   Arvilla Meres, M.D. [facility]  [employer]  [address]  [place]  [place] [address]
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
## 2010-09-05 NOTE — Letter (Signed)
February 17, 2006     RE:  [name]  MRN:  [id number]  /  DOB:  1945-12-27   [name]. Mark Gambler, MD  [address], [address]  [place], [place] [address]   Dear [name],   [name] returns to the office for continued assessment and treatment of  coronary disease and cardiovascular risk factors.  Since his stent  thrombosis in November 2006, he has done quite well.  He is careful about  continuing to take Clopidogrel but has lost his prescription for Toprol.  His other medications are:  1. Avalide 150/12.5 mg daily.  2. Aspirin 325 mg daily.  3. Atorvastatin 80 mg daily.  I have no lipid profile within the past year.   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  He denies all cardiopulmonary symptoms.  Unfortunately,  he continues to smoke cigarettes, albeit at a reduced rate.   PHYSICAL EXAMINATION:  GENERAL:  A pleasant, somewhat overweight gentleman  in no acute distress.  VITAL SIGNS:  The weight is 245, 2 pounds more than last year.  Blood  pressure 150/85, heart rate 60 and irregular, respirations 16.  NECK:  No jugular venous distention; normal carotid upstrokes without  bruits.  HEENT:  Clear sclerae; EOMs full.  LUNGS:  Clear.  CARDIAC:  Normal first and second heart sounds; fourth heart sound present.  ABDOMEN:  Soft and nontender; no organomegaly; aortic pulsation not  palpable.  EXTREMITIES:  No edema; normal distal pulses.  NEUROMUSCULAR:  Symmetric strength and tone; normal cranial nerves.  PSYCHIATRIC:  Alert and oriented; normal affect.   EKG:  Normal sinus rhythm; borderline voltage criteria for LDH; otherwise  unremarkable.  When compared to a prior tracing of January 15, 2004, there  has been a loss of R wave in lead III.   IMPRESSION:  [name] is doing generally well.  Control of hypertension  is suboptimal.  Due to relative bradycardia, metoprolol will not be resumed.  We will  start amlodipine at a dose of 5 mg daily.   Lipid profile will be assessed and therapy adjusted.  Vaccinations are up to  date.  He is strongly encouraged to once again discontinue all use of  tobacco products.  If not, we will try pharmacologic therapy.  I will see  him again in 6 months.    Sincerely,      [name]. Dietrich Pates, MD, [facility]  Electronically Signed    RMR/MedQ  DD: 02/17/2006  DT: 02/17/2006  Job #: [id number]

## 2010-09-05 NOTE — Discharge Summary (Signed)
NAME:  [name]             ACCOUNT NO.:  [id number]   MEDICAL RECORD NO.:  [id number]          PATIENT TYPE:  OIB   LOCATION:  6533                         FACILITY:  [facility]   PHYSICIAN:  Arturo Morton. Riley Kill, M.D. [facility] OF BIRTH:  03/05/1946   DATE OF ADMISSION:  04/04/2004  DATE OF DISCHARGE:  04/05/2004                                 DISCHARGE SUMMARY   PROCEDURE:  1.  Cardiac catheterization.  2.  Percutaneous intervention with PTCA (percutaneous transluminal coronary      angioplasty) and CYPHER stent x 2 to one vessel.   HOSPITAL COURSE:  [name] is a 65 year old man who had no previous  history of heart disease.  He was referred to Dr. Dorethea Clan, in [place], for  chest pain.  A Cardiolite was performed which was abnormal.  He had a cath  in the [facility] lab which showed a 99% RCA.  He was scheduled for percutaneous  intervention and is here today for the procedure.   [name] had cardiac catheterization and CYPHER stent x 2 to the RCA,  reducing an 80% stenosis and a 99% stenosis to 0 with TIMI III flow.  He  tolerated the procedure well.  Medical therapy is recommended for moderate  residual coronary artery disease.   The next day, Mr. Mellody Life labs were stable.  He had no chest pain or  shortness of breath.  He is to be seen by cardiac rehab for an outpatient  walking program and stent restrictions as well as use of nitroglycerin and  calling [phone].  He is considered stable for discharge on April 05, 2004,  and is to follow up as an outpatient with Dr. Dorethea Clan and Dr. Sherwood Gambler.   DISCHARGE CONDITION:  Improved.   DISCHARGE DIAGNOSIS:  1.  Anginal pain, status post percutaneous transluminal coronary angioplasty      (PTCA) and CYPHER stent x 2 to the right coronary artery (RCA) this      admission.  2.  Status post cardiac catheterization on April 01, 2004, with moderate      residual coronary artery disease and a preserved ejection fraction,      medical  therapy recommended.  3.  Hyperlipidemia, follow up lipid and liver profile in 6-12 weeks.  4.  Hypertension.  5.  Tobacco use.  6.  Bradycardia secondary to Toprol XL, asymptomatic.  No medication change      is planned.   DISCHARGE INSTRUCTIONS:  His activity level is to include no driving for two  days and no strenuous activity for a week.  He is to stick to the office.  He is to call for problems with the cath site.  A message has been left with  the office for him to have a follow-up appointment with Dr. Dorethea Clan, and he  needs to follow up with Dr. Sherwood Gambler as needed.   DISCHARGE MEDICATIONS:  1.  Coated aspirin 325 mg daily.  2.  Plavix 75 mg daily for a year.  3.  Nitroglycerin p.r.n.  4.  Toprol XL 50 mg daily.  5.  Avalide 300/12.5 mg daily.  6.  Lipitor 20 daily       RB/MEDQ  D:  04/05/2004  T:  04/05/2004  Job:  [id number]   cc:   Madelin Rear. Sherwood Gambler, MD  [address]  [place]  [place] [address]  Fax: [phone]   Vida Roller, M.D.  Fax: [phone]

## 2010-09-05 NOTE — Discharge Summary (Signed)
NAMEBevelyn [name]             ACCOUNT NO.:  [id number]   MEDICAL RECORD NO.:  [id number]          PATIENT TYPE:  INP   LOCATION:  2905                         FACILITY:  [facility]   PHYSICIAN:  Charlies Constable, M.D. [facility] DATE OF BIRTH:  1945/10/02   DATE OF ADMISSION:  02/27/2005  DATE OF DISCHARGE:  03/02/2005                                 DISCHARGE SUMMARY   PRIMARY CARE PHYSICIAN:  Dr. Sherwood Gambler in [place].   PRIMARY CARDIOLOGIST:  Dr. Dionicio Stall in [place].   PRINCIPAL DIAGNOSIS:  Inferior ST elevation myocardial infarction.   OTHER DIAGNOSES:  1.  Syncope.  2.  Hypertension.  3.  Hyperlipidemia.  4.  Ongoing tobacco abuse.  5.  Obesity.  6.  Coronary artery disease.   PROCEDURES:  1.  Left heart cardiac catheterization.  2.  PTCA of the proximal RCA.  3.  IVUS of the LAD and RCA.   ALLERGIES:  NO KNOWN DRUG ALLERGIES.   HISTORY OF PRESENT ILLNESS:  A 65 year old white male with prior history of  CAD status post PCI and stenting of a sub-total occlusion of the RCA with  overlapping Cypher drug-eluting stent in December of 2005. He was in his  usual state of health without any evidence of angina until February 27, 2005, when he came home from work at approximately 7 p.m. and developed  indigestion and had sudden onset of syncope. EMS was called and he was found  to be hypotensive with blood pressures in the 70s and EKG revealed 1 mm ST  segment elevation inferiorly. He was taken to the [facility] ED, where he  was pain free but still had low blood pressures in the 70s, up to 85 to 90  systolically with IV fluids.   HOSPITAL COURSE:  He was taken to the cardiac catheterization lab urgently  on February 27, 2005, where catheterization revealed total occlusion of the  proximal RCA with an 80% stenosis in the mid LAD. His EF was normal with  inferior hypokinesis. He underwent successful PTCA of the RCA performed by  Dr. Juanda Chance and plan was for staged PCI of the mid  LAD. Over the weekend, he  had no additional chest pain and he was seen by the smoking cessation  counseling team. On March 02, 2005, he was taken back to the cardiac  catheterization lab where intravascular ultrasound was performed in the LAD,  revealing non-obstructive disease there as well as a widely patent stent in  the RCA. He is being discharged home today in satisfactory condition with  the recommendation to remain on Plavix for life. To followup with Dr. Dorethea Clan  in [place].   DISCHARGE LABORATORY DATA:  Hemoglobin 12.4, hematocrit 35.1, WBC 7.9,  platelets 190, MCV 84.9. Sodium 138, potassium 4, chloride 104, CO2 27, BUN  10, creatinine 1, glucose 108. A peak CK 1022, peak MB 110.8, peak troponin  I 16.04. Total bilirubin 0.9, alkaline phosphatase 60, AST 21, ALT 14,  albumin 3.7, calcium 8.5. C-reactive protein 0.1, TSH 1.162, hemoglobin A1C  5.9, D-dimer is 0.44.   DISPOSITION:  The patient is being  discharged home today in good condition.   FOLLOW UP PLANS AND APPOINTMENTS:  1.  He has a followup appointment with Dr. Dorethea Clan in [place] on March 16, 2005 at 1:30 p.m.  2.  He has followup with his primary care physician, Dr. Sherwood Gambler, in 2 to 4      weeks.   DISCHARGE MEDICATIONS:  1.  Aspirin 325 mg daily.  2.  Plavix 75 mg daily.  3.  Toprol XL 50 mg daily.  4.  Lipitor 80 mg daily.  5.  Nitroglycerin 0.4 mg sublingual p.r.n. chest pain.  6.  HCTZ 12.5 mg daily.   OUTSTANDING LABORATORY STUDIES:  None.   DURATION OF DISCHARGE ENCOUNTER:  40 minutes including physician time.      Ok Anis, NP    ______________________________  Charlies Constable, M.D. [facility]    CRB/MEDQ  D:  03/02/2005  T:  03/03/2005  Job:  [id number]   cc:   Madelin Rear. Sherwood Gambler, MD  Fax: [phone]   Vida Roller, M.D.  Fax: [phone]

## 2010-09-05 NOTE — Op Note (Signed)
[place]  Patient:    [name]                    MRN: [id number] Proc. Date: 08/09/00 Adm. 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  OPERATION PERFORMED:  Lumbar laminotomy and micro endoscopic diskectomy with Met-Rx system and operating microscope microdissection technique.  SURGEON:  Stefani Dama, M.D.  ASSISTANT:  [name], M.D.  ANESTHESIA:  General endotracheal.  INDICATIONS:  Patient is a 65 year old individual who has been suffering with a severe right S1 radiculopathy for the past number of months.  Nothing seems to be relieving the pain and MRI recently disclosed that he has, what appears to be, a chronic disk herniation under the L5, S1 space with a degenerative spondylolisthesis at that area, and overgrown facet on the right side.  He has had some weakness in the gastroc on the right side and he was advised regarding surgical decompression via a micro endoscopic approach.  He was taken to the operating room.  DESCRIPTION OF PROCEDURE:  The patient was brought to the operating room, placed on a stretcher in a supine position.  After smooth induction of general endotracheal anesthesia he was turned onto the operating table prone. The back was shaved, prepped with DuraPrep and draped in a sterile fashion.  Then using fluoroscopic localization the L5, S1 space was localized.  It was noted that this patient had six lumbar tight vertebrae.  There was a degenerative spondylolisthesis that was slight at the L5, S1 space.   Then using radiographic localization the inferior margin of the lamina of L5  was identified with a K wire.  Area of the skin was then incised measuring approximately 18 mm in length, then dissection was carried down further with a series of dilators over the L5, S1 laminar space.  The 18 mm diameter was then chosen and an 18 mm x 5 cm deep cannula was affixed to the operating table with the operating clamp.  The interlaminar tissues were cleared from the right side.  Then using an ___________ drill and a 2.8 mm dissecting tool laminotomy was created.  The underlying portion of the lamina was then cleared of a thickened redundant yellow ligament up to the mesial wall of the facet.  Common dural tube was identified.  This area was dissected free and clear.  The area was then checked for hemostasis in the epidural veins.  The veins were divided, common dural sac and the nerve root were retracted medially; and, then the space was noted to have a bulge at the level of the spondylolisthesis.  The annulus was incised here and there were some soft fragments of disk material that were removed from this region.  A bar on the lateral aspect from the superior margin of the vertebra of S1 was taken down with a small osteophyte tool.  This allowed for clearance of the S1 nerve root as it entered into the foramen.  With this decompression being obtained hemostasis from the soft tissues was obtained.  The nerve was  sounded easily at the foramen.  The laminotomy site was cleared and checked for hemostasis; and, then the microscope was removed from the field and a singular 3-0 Vicryl stitch was placed in the fascia and 3-0 Vicryl was used to close the skin. Dermabond was placed on the skin.  The patient tolerated the procedure well and was taken to the recovery room in stable condition. DD:  08/09/00 TD:  08/10/00 Job: [id number]

## 2010-09-05 NOTE — H&P (Signed)
NAMEErnest [name] NO.:  [id number]   MEDICAL RECORD NO.:  [id number]          PATIENT TYPE:  INP   LOCATION:  1846                         FACILITY:  [facility]   PHYSICIAN:  Arvilla Meres, M.D. LHCDATE OF BIRTH:  [birth year]   DATE OF ADMISSION:  02/27/2005  DATE OF DISCHARGE:                                HISTORY & PHYSICAL   PRIMARY CARE PHYSICIAN:  Madelin Rear. Sherwood Gambler, M.D.   CARDIOLOGIST:  Vida Roller, M.D.   REASON FOR ADMISSION:  Inferior ST elevation myocardial infarction and  syncope.   HISTORY OF PRESENT ILLNESS:  [name] is a very pleasant 65 year old male  with a history of hypertension, hyperlipidemia, and tobacco use, as well as  coronary artery disease status post PCA and stenting of his RCA in December  2005.  He is being admitted through the ER with an inferior ST elevation MI  complicated by syncope and hypotension.   [name]'s cardiac history dates back to the end of 2005, when he  developed exertional chest pain concerning for angina.  He underwent a  Cardiolite which was suggestive of inferolateral ischemia.  He then  underwent cardiac catheterization, by Dr. Dorethea Clan, on April 01, 2004.  The  left main was normal.  The LAD had a 50% lesion in the mid portion.  The  left circumflex had a 75% stenosis in a small artery.  The right coronary  was a large dominant vessel with a 75% mid lesion and then was sub-totaled  just distally to this.  LV gram showed an EF of 55% with inferior  hypokinesis.  He then underwent a percutaneous angioplasty and stenting with  overlapping CYPHER drug-eluting stents by Dr. Bonnee Quin.   He has done quite well since that time without any evidence of angina.  Tonight he came home from work and around 7 p.m. developed some indigestion.  He went down to the store to get a soda and in getting out of his truck, he  had an abrupt syncopal episode without warning.  When he woke up, he felt  weak and  nauseated with continued indigestion.  EMS was summoned.  On their  arrival, he was hypotensive with a blood pressure in the 70s.  Initial EKG  in the field showed inferior ST elevation about 1-mm in several leads.  There was also ST depression in I and L.  He was started on intravenous  fluids and brought to the emergency room.  On arrival to the emergency room,  he remained hypotensive with blood pressure in the 70s.  He was given more  IV fluids and blood pressures now up into the mid 80s.  Currently, he has no  complaints.  He says he feels fine.  He denies any chest pressure, shortness  of breath, indigestion, or palpitations.   REVIEW OF SYSTEMS:  He denies any heart failure symptoms.  He has not had  any claudication.  There has been no bleeding, bright red blood per rectum  or melena.  He has not had any neurologic signs.  The rest of the review of  systems  is negative except for as HPI and the problem list.   PROBLEM LIST:  1.  Coronary artery disease.  As documented above, status post PCA and      stenting of the RCA in December 2005.  2.  Hypertension.  3.  Hyperlipidemia.  4.  Medical noncompliance.  5.  Tobacco use, ongoing.   CURRENT MEDICATIONS:  He is unable to give me the names adequately but in  going back through his history, it appears that he is on Avalide unclear  dose, previously on 312.5.  He is also on aspirin and Plavix.  He says he  just takes the Plavix on most days but not all.   ALLERGIES:  He has no known drug allergies.  He has not had a problem with  contrast dye.   SOCIAL HISTORY:  He lives in [place] with his wife.  He has also a son.  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  He  smokes approximately 4-5 cigarettes a day.  He has a history of about a half  pack a day x30 years.  He does not use alcohol or drugs.   FAMILY HISTORY:  His mother is alive at 89.  She just started on dialysis  for end-stage renal  disease.  Father is 21 and healthy.  There is no family  history of coronary artery disease.   PHYSICAL EXAMINATION:  GENERAL:  He is lying flat in bed in no acute  distress.  VITAL SIGNS:  Blood pressure is 86/47 with a heart rate of 56.  He is sating  in the high 90s on 2 liters of O2.  HEENT:  Sclerae are anicteric.  EOMI.  There is no xanthelasma.  Mucous  membranes are moist.  There is poor dentition.  NECK:  Supple.  There is no JVD.  Carotids are 2+ bilaterally without  bruits.  There is no lymphadenopathy or thyromegaly.  CARDIAC:  He is bradycardic and regular with no obvious murmurs, rubs, or  gallops.  LUNGS:  Clear to auscultation.  ABDOMEN:  Soft, nontender, nondistended.  There is no hepatosplenomegaly.  No bruits.  No masses.  EXTREMITIES:  Warm with no cyanosis, clubbing, or edema.  Femoral pulses are  2+ bilaterally without any bruits.  Distal pulses are 1+ bilaterally.  NEUROLOGIC:  He is alert and oriented x3.  His affect is appropriate.  Cranial nerves II-XII are intact.  He moves all four extremities without  difficulty.   LABORATORY DATA:  Currently pending.  Initial EKG in the field showed sinus  bradycardia.  There is 1-mm ST elevation in II, III, and F with some mild ST  depression in I and L.  Followup EKG shows about a 0.5-mm ST elevation in  lead III with mild ST depression in I and L.   ASSESSMENT/PLAN:  1.  Inferior ST elevation myocardial infarction complicated by abrupt      syncope.  He is started on heparin.  We will take him emergently to the      catheterization lab for evaluation of his coronary anatomy.  We will      continue aspirin and Plavix.  2.  Hyperlipidemia.  We will restart his Lipitor.  3.  Tobacco use.  He will need a smoking cessation consult.      Arvilla Meres, M.D. [facility]  Electronically Signed    DB/MEDQ  D:  02/27/2005  T:  02/27/2005  Job:  [id number]  cc:  Madelin Rear. Sherwood Gambler, MD  Fax: [phone]

## 2010-09-05 NOTE — H&P (Signed)
NAMEBevelyn [name]             ACCOUNT NO.:  [id number]   MEDICAL RECORD NO.:  [id number]          PATIENT TYPE:  OIB   LOCATION:  2899                         FACILITY:  [facility]   PHYSICIAN:  Arturo Morton. Riley Kill, M.D. [facility] OF BIRTH:  January 12, 1946   DATE OF ADMISSION:  04/04/2004  DATE OF DISCHARGE:                                HISTORY & PHYSICAL   CHIEF COMPLAINT:  Chest pain.   HISTORY OF PRESENT ILLNESS:  [name] is a 65 year old male with no known  history of coronary artery disease.  He was evaluated by Dr. Dorethea Clan in  September 2005, and Cardiolite was recommended.  The Cardiolite was  abnormal, and he came in for cardiac catheterization on April 01, 2004.  A 99% RCA lesion was discovered, and it was felt that percutaneous  intervention was the best option.  He returns today for the procedure.   Since his cardiac catheterization, he has had no problems with his  catheterization site.  He states he is taking it easy and has not returned  to work.  He has had no chest pain or shortness of breath.  He states that  he wishes to quit smoking.  He has been taking his medications as prescribed  and was loaded on Plavix with 150 mg x 1 and since then has been taking 75  mg a day.  He is pain free at the time of exam.   PAST MEDICAL HISTORY:  1.  Hypertension.  2.  Hyperlipidemia.  3.  Obesity.   ALLERGIES:  No known drug allergies.   MEDICATIONS:  1.  Avalide 300/12.5 mg p.o. daily  2.  Toprol XL 50 mg daily.  3.  Lipitor 20 mg daily.  4.  Aspirin 325 mg daily.  5.  Plavix 75 mg daily.   SOCIAL HISTORY:  He lives in [place] with his wife.  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  He smokes 6 to 8  cigarettes a day and does not abuse alcohol or drugs.  He has approximately  50-pack-year history of tobacco use.   FAMILY HISTORY:  His parents are both alive at age 54.  He has one brother  and one sister.  None of them have a history  of heart disease.   REVIEW OF SYSTEMS:  [name] denies any hematemesis, hemoptysis, or  melena.  He states he has no reflux symptoms.  He states he has had no side  effects that he can detect from any of the medications.  He denies dysuria.  He denies arthralgias.  He has had no recent illnesses or fevers.  Review of  Systems is otherwise negative.   PHYSICAL EXAMINATION:  VITAL SIGNS:  Temperature 97.7, blood pressure  150/77, heart rate 52, respiratory rate 18, O2 saturation 96% on room air.  GENERAL:  He is a well-developed, slightly obese male in no acute distress.  HEENT:  Head is normocephalic and atraumatic.  Pupils equal, round, and  reactive to light and accommodation.  Extraocular movements intact.  Sclerae  clear.  Nares without discharge.  NECK:  No lymphadenopathy, thyromegaly, bruit, or JVD.  CV:  Heart is regular in rate and rhythm with no significant murmurs, rubs,  or gallops noted.  CHEST: Clear to auscultation bilaterally.  ABDOMEN:  Soft and nontender with active bowel sounds.  EXTREMITIES:  He has a few small areas of ecchymosis around the previous  catheterization site, but there is no hematoma and no bruits appreciated  bilaterally.  Distal pulses are 2+ in all four extremities.  MUSCULOSKELETAL:  There is no effusions or joint deformities.  NEUROLOGIC:  He is alert and oriented with cranial nerves II-XII grossly  intact.   LABORATORY DATA AND OTHER STUDIES:  Chest x-ray:  No acute disease.   EKG is pending at the time of dictation.   Laboratory values are pending at the time of dictation.   IMPRESSION AND PLAN:  1.  Coronary artery disease:  At his catheterization on April 01, 2004,      [name] had a 50% left anterior descending with 75% obtuse marginal      and 75% right coronary artery in the mid portion with a 99% stenosis at      the curve and 50% at the crux.  His ejection fraction was 55% with      inferobasilar hypokinesis.  He is here  today for percutaneous      intervention to the right coronary artery, and medical therapy is      recommended for residual disease.  2.  Hypertension:  The patient is to be continued on his home medications.      With heart rate in the low 50s, we cannot increase the Toprol XL.      Further medication changes can be made once the intervention is done and      patient has recovered from this.  3.  Hyperlipidemia:  The patient needs to be followed closely for this.  His      lipid profile showed a total cholesterol of 203 with triglycerides of      97, HDL 45, LDL 139.  He has been started on Lipitor 20 mg daily and      needs a followup LFT and lipid profile in 6 to 12 weeks.  4.  Ongoing tobacco use:  Smoking cessation consult has been ordered.  He      will also have cardiac rehabilitation to see him for an exercise program      that he can do as an outpatient.   This is Theodore Demark, P.A. dictating for Arturo Morton. Riley Kill, M.D. who has  determined the plan of care.       RB/MEDQ  D:  04/04/2004  T:  04/04/2004  Job:  [id number]

## 2010-09-05 NOTE — Cardiovascular Report (Signed)
NAMEErnest [name] NO.:  [id number]   MEDICAL RECORD NO.:  [id number]          PATIENT TYPE:  OIB   LOCATION:  6598                         FACILITY:  [facility]   PHYSICIAN:  Vida Roller, M.D.   DATE OF BIRTH:  07/25/1945   DATE OF PROCEDURE:  04/01/2004  DATE OF DISCHARGE:                              CARDIAC CATHETERIZATION   PRIMARY CARE PHYSICIAN:  Artis Delay, M.D.   HISTORY OF PRESENT ILLNESS:  [name] is a 65 year old man with no known  coronary artery disease, but chest pain concerning for angina with risk  factors of hypertension, hyperlipidemia, ongoing tobacco abuse who presents  with an abnormal perfusion scan showing inferior lateral ischemia with a  preserved left ventricular systolic function.  He came to the  catheterization laboratory for diagnostic angiography.   PROCEDURES PERFORMED:  1.  Left heart catheterization.  2.  Selective coronary angiography.  3.  Left ventriculography   DETAILS OF THE PROCEDURE:  After obtaining informed consent the patient was  brought to the cardiac catheterization laboratory in the fasting state.  There he was prepped and draped in the usual sterile manner and and the  right groin was anesthetized using 1% lidocaine without epinephrine.  The  right femoral artery was cannulated using 4-French 10 cm sheath and left  heart catheterization was performed using a Judkins left #4 and a Champ  catheter.  Those were the catheters that were used to seat the coronary  arteries.  A Judkins right #4, a progressive right, and Amplatz right #2  were all used, but were unsuccessful at cannulating the right coronary  artery.  A pigtail catheter was used for left ventriculography.  This was  imaged in 30 degree RAO views with a power injector.  At the conclusion of  the procedure that catheters were removed.  The patient was moved back to  the holding area.  The femoral artery sheath was removed.  Hemostasis was  obtained using direct manual pressure.  At the conclusion of the hold there  was no evidence of ecchymosis of hematoma formation and distal pulses were  intact.  Total fluoroscopic time 13.5 minutes.  Total ionized contrast used  was 100 mL.   RESULTS:  Aortic pressure 121/63 with a mean of 87 mmHg.  Left ventricular  pressure 118/14 with an end-diastolic pressure of 20 mmHg.   SELECTIVE CORONARY ANGIOGRAPHY:  The left main coronary artery is a large  artery which has luminal irregularities.   The left anterior descending coronary artery is a large transapical vessel  with two large diagonals.  It has a 50% lesion at the bifurcation of a  septal perforating branch which does not appear to be obstructive.   The left circumflex coronary artery is a moderate caliber vessel which is  diffusely diseased.  There is one obtuse marginal which has a severe  stenosis of approximately 75% but it is a small artery.  The ongoing  circumflex in the AV groove has luminal irregularities culminating with a  50% lesion prior to a posterolateral branch.   The right coronary artery is a  large, dominant vessel which is severely and  diffusely diseased with a 75% mid lesion and 99% lesion at the curve and 50%  lesion at the crux with diffuse disease distal in the posterior descending  and posterolateral branches.   Left ventriculography reveals an ejection fraction of 55% with inferior  basilar hypokinesis.  No mitral regurgitation was seen.   INTERPRETATION:  1.  Single vessel coronary disease.  2.  Normal left ventricular systolic function.   PLAN:  Continue medical therapy with relatively aggressive attention to  smoking cessation and hypercholesterolemia.  I will review this case with  Dr. Bonnee Quin with consideration for percutaneous revascularization.  This  would be a challenging case to revascularize, but is potentially doable.      [name]   JH/MEDQ  D:  04/01/2004  T:  04/01/2004  Job:   [id number]

## 2010-09-05 NOTE — Cardiovascular Report (Signed)
NAMEErnest [name] NO.:  [id number]   MEDICAL RECORD NO.:  [id number]          PATIENT TYPE:  INP   LOCATION:  2905                         FACILITY:  [facility]   PHYSICIAN:  Arvilla Meres, M.D. [facility] OF BIRTH:  [birth year]   DATE OF PROCEDURE:  02/27/2005  DATE OF DISCHARGE:                              CARDIAC CATHETERIZATION   PRIMARY CARE PHYSICIAN:  Madelin Rear. Dorris Fetch, [place].   CARDIOLOGIST:  Vida Roller, M.D.   PATIENT IDENTIFICATION:  [name] is a 65 year old male with a history  of hypertension, hyperlipidemia and ongoing tobacco use as well as coronary  artery disease, status post PTCA and stenting with overlapping Cypher stents  to his RCA and in December 2005 presented to the emergency room with  indigestion and abrupt onset of syncope.  EKG was showed inferior ST  elevations concerning for acute MI.  He is brought to the catheterization  lab urgent.  Onset of symptoms was that 7:00 p.m..   PROCEDURES PERFORMED:  1.  Selective coronary angiography.  2.  Left heart catheterization.  3.  Left ventriculogram.   DESCRIPTION OF PROCEDURE:  The risks and benefits of catheterization were  explained [name].  Consent was signed and placed on the chart.  A 6  French arterial sheath was placed in the right femoral artery using a  modified Seldinger technique.  Standard catheters including preformed  Judkins JL-4, JR-4 and angled pigtail were used for the procedure.  All  catheter exchanges were made over a wire. There were no apparent  complications.  A 6-French venous sheath was also inserted into the right  femoral vein.   FINDINGS/HEMODYNAMIC RESULTS:  Central aortic pressures 109/50 with a mean  of 79.  LV pressure was 13/12 and EDP of 21.  There was no gradient on  aortic valve pullback.   CORONARY ANATOMY:  1.  Left main:  30% distal.  2.  Left anterior descending:  The LAD was a long vessel wrapping  the apex.      It gave off two small diagonals.  There was an 80% lesion in the      midportion just prior to a large septal branch. There was a 40% in the      midportion after  the septal perforator. The first diagonal was tiny and      totally occluded proximally.  3.  Left circumflex was moderate size vessel.  It had significant disease.      It gave off a very large branching ramus, a tiny OM-1 tiny, a tiny OM-2      and a small OM-3. There is a 30% lesion in the proximal circumflex, a      40% in the distal portion and 70% following this in the distal portion      leading into the OM-3.  In the OM-1 there is a 90% ostial lesion which      was old.  This vessel was about 1.5 mm.  In the OM-2, there was a 50%      lesion in the midportion.  This was also a 1.5 mm vessel.  4.  Right coronary artery:  The right coronary artery was a dominant vessel.      It was totally occluded in the proximal portion right at beginning of      the previously placed stents.  5.  Left ventriculogram shot in the RAO approach showed an EF of about 55%      with inferior hypokinesis.   ASSESSMENT:  1.  Inferior ST elevation myocardial infarction secondary to the right      coronary artery stent thrombosis.  2.  Three-vessel coronary artery disease as described above.  3.  Normal left ventricular function with inferior basilar hypokinesis.   PLAN:  The plan will be for PCI of the RCA urgently by Dr. Charlies Constable.  He  will likely need a staged intervention of the mid LAD.  Otherwise he will  need aggressive risk factor management including smoking cessation and  management of his blood pressure and lipids.      Arvilla Meres, M.D. [facility]  Electronically Signed     DB/MEDQ  D:  02/27/2005  T:  03/01/2005  Job:  [id number]   cc:   Madelin Rear. Sherwood Gambler, MD  Fax: [phone]

## 2011-01-26 ENCOUNTER — Other Ambulatory Visit: Payer: Self-pay | Admitting: Dermatology

## 2011-01-27 LAB — CBC
HCT: 33.8 — ABNORMAL LOW
Hemoglobin: 11.8 — ABNORMAL LOW
MCHC: 34.8
MCV: 87.1
Platelets: 191
RBC: 3.89 — ABNORMAL LOW
RDW: 13
WBC: 7

## 2011-01-27 LAB — BASIC METABOLIC PANEL
BUN: 10
CO2: 29
Calcium: 8.1 — ABNORMAL LOW
Chloride: 104
Creatinine, Ser: 0.98
GFR calc Af Amer: >60
GFR calc non Af Amer: >60
Glucose, Bld: 140 — ABNORMAL HIGH
Potassium: 4.1
Sodium: 137

## 2011-01-27 LAB — HEMOGLOBIN AND HEMATOCRIT, BLOOD
HCT: 37 — ABNORMAL LOW
Hemoglobin: 12.6 — ABNORMAL LOW

## 2011-01-27 LAB — DIFFERENTIAL
Basophils Absolute: 0
Basophils Relative: 0
Eosinophils Absolute: 0
Eosinophils Relative: 0
Lymphocytes Relative: 8 — ABNORMAL LOW
Lymphs Abs: 0.6 — ABNORMAL LOW
Monocytes Absolute: 0.6
Monocytes Relative: 8
Neutro Abs: 5.8
Neutrophils Relative %: 83 — ABNORMAL HIGH

## 2011-01-27 LAB — CREATININE, FLUID (PLEURAL, PERITONEAL, JP DRAINAGE): Creat, Fluid: 0.9

## 2011-01-27 LAB — MISCELLANEOUS TEST: Miscellaneous Test Results: 8

## 2011-01-28 LAB — BASIC METABOLIC PANEL
BUN: 14
CO2: 31
Calcium: 9.1
Chloride: 98
Creatinine, Ser: 0.97
GFR calc Af Amer: >60
GFR calc non Af Amer: >60
Glucose, Bld: 115 — ABNORMAL HIGH
Potassium: 3.9
Sodium: 136

## 2011-01-28 LAB — CBC
HCT: 42.9
Hemoglobin: 14.8
MCHC: 34.5
MCV: 87.1
Platelets: 237
RBC: 4.93
RDW: 13.4
WBC: 4.7

## 2011-01-28 LAB — TYPE AND SCREEN
ABO/RH(D): A POS
Antibody Screen: NEGATIVE

## 2011-01-28 LAB — ABO/RH: ABO/RH(D): A POS

## 2011-01-28 LAB — PROTIME-INR
INR: 0.9
Prothrombin Time: 12.5

## 2011-01-28 LAB — APTT: aPTT: 28

## 2011-04-01 ENCOUNTER — Encounter: Payer: Self-pay | Admitting: Cardiology

## 2011-05-29 DIAGNOSIS — C61 Malignant neoplasm of prostate: Secondary | ICD-10-CM | POA: Diagnosis not present

## 2011-06-05 DIAGNOSIS — N529 Male erectile dysfunction, unspecified: Secondary | ICD-10-CM | POA: Diagnosis not present

## 2011-06-05 DIAGNOSIS — C61 Malignant neoplasm of prostate: Secondary | ICD-10-CM | POA: Diagnosis not present

## 2011-10-20 DIAGNOSIS — J329 Chronic sinusitis, unspecified: Secondary | ICD-10-CM | POA: Diagnosis not present

## 2011-10-20 DIAGNOSIS — R42 Dizziness and giddiness: Secondary | ICD-10-CM | POA: Diagnosis not present

## 2011-10-20 DIAGNOSIS — R05 Cough: Secondary | ICD-10-CM | POA: Diagnosis not present

## 2011-10-20 DIAGNOSIS — R059 Cough, unspecified: Secondary | ICD-10-CM | POA: Diagnosis not present

## 2011-12-03 DIAGNOSIS — N529 Male erectile dysfunction, unspecified: Secondary | ICD-10-CM | POA: Diagnosis not present

## 2011-12-03 DIAGNOSIS — C61 Malignant neoplasm of prostate: Secondary | ICD-10-CM | POA: Diagnosis not present

## 2011-12-10 DIAGNOSIS — C61 Malignant neoplasm of prostate: Secondary | ICD-10-CM | POA: Diagnosis not present

## 2011-12-11 DIAGNOSIS — E119 Type 2 diabetes mellitus without complications: Secondary | ICD-10-CM | POA: Diagnosis not present

## 2011-12-11 DIAGNOSIS — L03019 Cellulitis of unspecified finger: Secondary | ICD-10-CM | POA: Diagnosis not present

## 2012-01-20 DIAGNOSIS — E119 Type 2 diabetes mellitus without complications: Secondary | ICD-10-CM | POA: Diagnosis not present

## 2012-01-20 DIAGNOSIS — Z23 Encounter for immunization: Secondary | ICD-10-CM | POA: Diagnosis not present

## 2012-01-20 DIAGNOSIS — IMO0002 Reserved for concepts with insufficient information to code with codable children: Secondary | ICD-10-CM | POA: Diagnosis not present

## 2012-01-20 DIAGNOSIS — R350 Frequency of micturition: Secondary | ICD-10-CM | POA: Diagnosis not present

## 2012-03-08 DIAGNOSIS — R599 Enlarged lymph nodes, unspecified: Secondary | ICD-10-CM | POA: Diagnosis not present

## 2012-05-23 DIAGNOSIS — I10 Essential (primary) hypertension: Secondary | ICD-10-CM | POA: Diagnosis not present

## 2012-05-23 DIAGNOSIS — E119 Type 2 diabetes mellitus without complications: Secondary | ICD-10-CM | POA: Diagnosis not present

## 2012-05-23 DIAGNOSIS — I2510 Atherosclerotic heart disease of native coronary artery without angina pectoris: Secondary | ICD-10-CM | POA: Diagnosis not present

## 2012-05-23 DIAGNOSIS — Z79899 Other long term (current) drug therapy: Secondary | ICD-10-CM | POA: Diagnosis not present

## 2012-05-23 DIAGNOSIS — G589 Mononeuropathy, unspecified: Secondary | ICD-10-CM | POA: Diagnosis not present

## 2012-06-15 DIAGNOSIS — C61 Malignant neoplasm of prostate: Secondary | ICD-10-CM | POA: Diagnosis not present

## 2012-06-21 DIAGNOSIS — C61 Malignant neoplasm of prostate: Secondary | ICD-10-CM | POA: Diagnosis not present

## 2012-08-12 DIAGNOSIS — R21 Rash and other nonspecific skin eruption: Secondary | ICD-10-CM | POA: Diagnosis not present

## 2012-11-07 DIAGNOSIS — M545 Low back pain, unspecified: Secondary | ICD-10-CM | POA: Diagnosis not present

## 2012-11-07 DIAGNOSIS — M25569 Pain in unspecified knee: Secondary | ICD-10-CM | POA: Diagnosis not present

## 2012-11-18 DIAGNOSIS — M25569 Pain in unspecified knee: Secondary | ICD-10-CM | POA: Diagnosis not present

## 2012-11-18 DIAGNOSIS — IMO0002 Reserved for concepts with insufficient information to code with codable children: Secondary | ICD-10-CM | POA: Diagnosis not present

## 2012-11-18 DIAGNOSIS — M25469 Effusion, unspecified knee: Secondary | ICD-10-CM | POA: Diagnosis not present

## 2012-11-18 DIAGNOSIS — M1710 Unilateral primary osteoarthritis, unspecified knee: Secondary | ICD-10-CM | POA: Diagnosis not present

## 2012-11-21 DIAGNOSIS — E119 Type 2 diabetes mellitus without complications: Secondary | ICD-10-CM | POA: Diagnosis not present

## 2012-11-21 DIAGNOSIS — E785 Hyperlipidemia, unspecified: Secondary | ICD-10-CM | POA: Diagnosis not present

## 2012-11-21 DIAGNOSIS — Z8546 Personal history of malignant neoplasm of prostate: Secondary | ICD-10-CM | POA: Diagnosis not present

## 2012-11-21 DIAGNOSIS — R252 Cramp and spasm: Secondary | ICD-10-CM | POA: Diagnosis not present

## 2012-11-21 DIAGNOSIS — I2510 Atherosclerotic heart disease of native coronary artery without angina pectoris: Secondary | ICD-10-CM | POA: Diagnosis not present

## 2012-11-21 DIAGNOSIS — I10 Essential (primary) hypertension: Secondary | ICD-10-CM | POA: Diagnosis not present

## 2012-11-21 DIAGNOSIS — Z79899 Other long term (current) drug therapy: Secondary | ICD-10-CM | POA: Diagnosis not present

## 2012-12-16 DIAGNOSIS — C61 Malignant neoplasm of prostate: Secondary | ICD-10-CM | POA: Diagnosis not present

## 2012-12-22 DIAGNOSIS — C61 Malignant neoplasm of prostate: Secondary | ICD-10-CM | POA: Diagnosis not present

## 2013-01-12 DIAGNOSIS — M25469 Effusion, unspecified knee: Secondary | ICD-10-CM | POA: Diagnosis not present

## 2013-01-12 DIAGNOSIS — M1710 Unilateral primary osteoarthritis, unspecified knee: Secondary | ICD-10-CM | POA: Diagnosis not present

## 2013-01-12 DIAGNOSIS — IMO0002 Reserved for concepts with insufficient information to code with codable children: Secondary | ICD-10-CM | POA: Diagnosis not present

## 2013-01-13 DIAGNOSIS — R21 Rash and other nonspecific skin eruption: Secondary | ICD-10-CM | POA: Diagnosis not present

## 2013-01-13 DIAGNOSIS — IMO0002 Reserved for concepts with insufficient information to code with codable children: Secondary | ICD-10-CM | POA: Diagnosis not present

## 2013-01-21 DIAGNOSIS — L509 Urticaria, unspecified: Secondary | ICD-10-CM | POA: Diagnosis not present

## 2013-02-09 DIAGNOSIS — Z23 Encounter for immunization: Secondary | ICD-10-CM | POA: Diagnosis not present

## 2013-02-09 DIAGNOSIS — M79609 Pain in unspecified limb: Secondary | ICD-10-CM | POA: Diagnosis not present

## 2013-02-09 DIAGNOSIS — R252 Cramp and spasm: Secondary | ICD-10-CM | POA: Diagnosis not present

## 2013-02-14 ENCOUNTER — Other Ambulatory Visit: Payer: Self-pay | Admitting: *Deleted

## 2013-02-14 DIAGNOSIS — R0989 Other specified symptoms and signs involving the circulatory and respiratory systems: Secondary | ICD-10-CM

## 2013-02-14 DIAGNOSIS — M79609 Pain in unspecified limb: Secondary | ICD-10-CM

## 2013-02-21 DIAGNOSIS — M1710 Unilateral primary osteoarthritis, unspecified knee: Secondary | ICD-10-CM | POA: Diagnosis not present

## 2013-02-21 DIAGNOSIS — IMO0002 Reserved for concepts with insufficient information to code with codable children: Secondary | ICD-10-CM | POA: Diagnosis not present

## 2013-02-21 DIAGNOSIS — M25469 Effusion, unspecified knee: Secondary | ICD-10-CM | POA: Diagnosis not present

## 2013-03-08 ENCOUNTER — Encounter: Payer: Self-pay | Admitting: Vascular Surgery

## 2013-03-09 ENCOUNTER — Ambulatory Visit (INDEPENDENT_AMBULATORY_CARE_PROVIDER_SITE_OTHER): Payer: Medicare Other | Admitting: Vascular Surgery

## 2013-03-09 ENCOUNTER — Ambulatory Visit (HOSPITAL_COMMUNITY)
Admission: RE | Admit: 2013-03-09 | Discharge: 2013-03-09 | Disposition: A | Discharge status: Home / Self Care | Payer: Medicare Other | Source: Ambulatory Visit | Attending: Vascular Surgery | Admitting: Vascular Surgery

## 2013-03-09 ENCOUNTER — Encounter: Payer: Self-pay | Admitting: Vascular Surgery

## 2013-03-09 VITALS — BP 156/84 | HR 55 | Ht 74.0 in | Wt 237.4 lb

## 2013-03-09 DIAGNOSIS — M79609 Pain in unspecified limb: Secondary | ICD-10-CM | POA: Diagnosis not present

## 2013-03-09 DIAGNOSIS — R0989 Other specified symptoms and signs involving the circulatory and respiratory systems: Secondary | ICD-10-CM

## 2013-03-09 DIAGNOSIS — I70219 Atherosclerosis of native arteries of extremities with intermittent claudication, unspecified extremity: Secondary | ICD-10-CM

## 2013-03-09 NOTE — Progress Notes (Signed)
[facility] [place] HISTORY AND PHYSICAL   History of Present Illness:  Patient is a 67 y.o. year old male who presents for evaluation of left leg pain and cramping. The patient states he develops cramping in his left leg which occurs usually at night but sometimes early morning. The cramping is not associated with ambulation. He states that he can walk up to 5 miles with no pain in his legs. He denies rest pain. He denies nonhealing wounds.  Other medical problems include diabetes, hypertension, elevated cholesterol, coronary artery disease and prior prostate cancer. These are all currently stable. Of note he has had previous vein harvest of both lower extremities for coronary artery bypass grafting. He has chronic right leg swelling since this. Past Medical History  Diagnosis Date  . ASCVD (arteriosclerotic cardiovascular disease)     03/2004: DES x2 to the RCA; IMI with stent occlusion in 02/2005- suboptimal Plavix compliance  . Hypertension   . Tobacco abuse   . Hyperlipidemia   . Morbid obesity   . Mild depression   . Carcinoma of prostate     Clopidogrel held for biopsy  . CAD (coronary artery disease)   . Diabetes mellitus without complication     Past Surgical History  Procedure Laterality Date  . Lumbar spine surgery  2002  . Vasectomy    . Prostatectomy  02/2007  . Coronary artery bypass graft  06/28/2008    X4    Social History History  Substance Use Topics  . Smoking status: Former Smoker    Types: Cigarettes  . Smokeless tobacco: Former Neurosurgeon    Quit date: 06/28/2008  . Alcohol Use: 1.2 oz/week    2 Glasses of wine per week     Comment: Occasionally    Family History Family History  Problem Relation Age of Onset  . Kidney disease Mother   . Colon cancer Neg Hx     Allergies  No Known Allergies   Current Outpatient Prescriptions  Medication Sig Dispense Refill  . amLODipine (NORVASC) 5 MG tablet Take 5 mg by mouth daily.        Marland Kitchen  aspirin 325 MG tablet Take 325 mg by mouth daily.        . hydrochlorothiazide (HYDRODIURIL) 25 MG tablet Take 25 mg by mouth daily.      Marland Kitchen losartan (COZAAR) 100 MG tablet Take 100 mg by mouth daily.      . metFORMIN (GLUMETZA) 1000 MG (MOD) 24 hr tablet Take 1,000 mg by mouth daily with breakfast.      . metoprolol tartrate (LOPRESSOR) 25 MG tablet Take 12.5 mg by mouth daily.      Marland Kitchen PARoxetine (PAXIL) 20 MG tablet Take 20 mg by mouth daily. 1/2 tablet      . rosuvastatin (CRESTOR) 20 MG tablet Take 20 mg by mouth daily.      . clopidogrel (PLAVIX) 75 MG tablet Take 75 mg by mouth daily.        . irbesartan-hydrochlorothiazide (AVALIDE) 300-25 MG per tablet Take 1 tablet by mouth daily.        Marland Kitchen Leuprolide Acetate (LUPRON IJ) Inject as directed every 3 (three) months.        . simvastatin (ZOCOR) 40 MG tablet Take 40 mg by mouth at bedtime.         No current facility-administered medications for this visit.    ROS:   General:  No weight loss, Fever, chills  HEENT: No recent  headaches, no nasal bleeding, no visual changes, no sore throat  Neurologic: No dizziness, blackouts, seizures. No recent symptoms of stroke or mini- stroke. No recent episodes of slurred speech, or temporary blindness.  Cardiac: No recent episodes of chest pain/pressure, no shortness of breath at rest.  No shortness of breath with exertion.  Denies history of atrial fibrillation or irregular heartbeat  Vascular: No history of rest pain in feet.  No history of claudication.  No history of non-healing ulcer, No history of DVT   Pulmonary: No home oxygen, no productive cough, no hemoptysis,  No asthma or wheezing  Musculoskeletal:  [ ]  Arthritis, [ ]  Low back pain,  [ ]  Joint pain  Hematologic:No history of hypercoagulable state.  No history of easy bleeding.  No history of anemia  Gastrointestinal: No hematochezia or melena,  No gastroesophageal reflux, no trouble swallowing  Urinary: [ ]  chronic Kidney  disease, [ ]  on HD - [ ]  MWF or [ ]  TTHS, [ ]  Burning with urination, [ ]  Frequent urination, [ ]  Difficulty urinating;   Skin: No rashes  Psychological: No history of anxiety,  No history of depression   Physical Examination  Filed Vitals:   03/09/13 0953  BP: 156/84  Pulse: 55  Height: 6\' 2"  (1.88 m)  Weight: 237 lb 6.4 oz (107.684 kg)  SpO2: 97%    Body mass index is 30.47 kg/(m^2).  General:  Alert and oriented, no acute distress HEENT: Normal Neck: No bruit or JVD Pulmonary: Clear to auscultation bilaterally Cardiac: Regular Rate and Rhythm without murmur Abdomen: Soft, non-tender, non-distended, no mass, no scars Skin: No rash Extremity Pulses:  2+ radial, brachial, femoral, right 1+ dorsalis pedis 2+ posterior tibial pulse, left 2+ dorsalis pedis pulse one plus posterior tibial pulse Musculoskeletal: No deformity or edema  Neurologic: Upper and lower extremity motor 5/5 and symmetric  DATA:  Patient had bilateral ABIs performed today. There were triphasic normal waveforms with greater than 1 ABI bilaterally. This was a normal exam. I reviewed and interpreted this study.   ASSESSMENT:  Idiopathic night time cramps. No evidence of significant peripheral arterial disease.   PLAN:  Discussed with patient pathophysiology of night time cramps. Also discussed with him that there limited options for treatment. There are some medications available however these did not work very well. Reassured the patient that he is not at risk of limb loss at this point. Counseled patient about diabetic foot care management. He will followup on as-needed basis.  Fabienne Bruns, MD [facility] [place] Office: [phone] Pager: [phone]

## 2013-05-24 DIAGNOSIS — IMO0002 Reserved for concepts with insufficient information to code with codable children: Secondary | ICD-10-CM | POA: Diagnosis not present

## 2013-05-24 DIAGNOSIS — E119 Type 2 diabetes mellitus without complications: Secondary | ICD-10-CM | POA: Diagnosis not present

## 2013-05-24 DIAGNOSIS — Z79899 Other long term (current) drug therapy: Secondary | ICD-10-CM | POA: Diagnosis not present

## 2013-05-24 DIAGNOSIS — M1710 Unilateral primary osteoarthritis, unspecified knee: Secondary | ICD-10-CM | POA: Diagnosis not present

## 2013-05-24 DIAGNOSIS — Z23 Encounter for immunization: Secondary | ICD-10-CM | POA: Diagnosis not present

## 2013-05-24 DIAGNOSIS — I2510 Atherosclerotic heart disease of native coronary artery without angina pectoris: Secondary | ICD-10-CM | POA: Diagnosis not present

## 2013-05-24 DIAGNOSIS — I10 Essential (primary) hypertension: Secondary | ICD-10-CM | POA: Diagnosis not present

## 2013-06-22 DIAGNOSIS — C61 Malignant neoplasm of prostate: Secondary | ICD-10-CM | POA: Diagnosis not present

## 2013-12-18 DIAGNOSIS — C61 Malignant neoplasm of prostate: Secondary | ICD-10-CM | POA: Diagnosis not present

## 2013-12-26 DIAGNOSIS — C61 Malignant neoplasm of prostate: Secondary | ICD-10-CM | POA: Diagnosis not present

## 2014-01-18 ENCOUNTER — Encounter: Payer: Self-pay | Admitting: Gastroenterology

## 2014-06-19 DIAGNOSIS — C61 Malignant neoplasm of prostate: Secondary | ICD-10-CM | POA: Diagnosis not present

## 2014-06-26 DIAGNOSIS — C61 Malignant neoplasm of prostate: Secondary | ICD-10-CM | POA: Diagnosis not present

## 2014-07-06 DIAGNOSIS — B029 Zoster without complications: Secondary | ICD-10-CM | POA: Diagnosis not present

## 2014-07-11 DIAGNOSIS — B0229 Other postherpetic nervous system involvement: Secondary | ICD-10-CM | POA: Diagnosis not present

## 2014-07-25 DIAGNOSIS — M5416 Radiculopathy, lumbar region: Secondary | ICD-10-CM | POA: Diagnosis not present

## 2014-07-30 DIAGNOSIS — M5441 Lumbago with sciatica, right side: Secondary | ICD-10-CM | POA: Diagnosis not present

## 2014-07-31 ENCOUNTER — Other Ambulatory Visit: Payer: Self-pay | Admitting: Sports Medicine

## 2014-07-31 DIAGNOSIS — M5441 Lumbago with sciatica, right side: Secondary | ICD-10-CM

## 2014-08-07 DIAGNOSIS — Z01812 Encounter for preprocedural laboratory examination: Secondary | ICD-10-CM | POA: Diagnosis not present

## 2014-08-10 ENCOUNTER — Other Ambulatory Visit: Payer: PRIVATE HEALTH INSURANCE

## 2014-08-10 DIAGNOSIS — M5441 Lumbago with sciatica, right side: Secondary | ICD-10-CM | POA: Diagnosis not present

## 2014-08-17 DIAGNOSIS — M5136 Other intervertebral disc degeneration, lumbar region: Secondary | ICD-10-CM | POA: Diagnosis not present

## 2014-08-23 DIAGNOSIS — I2510 Atherosclerotic heart disease of native coronary artery without angina pectoris: Secondary | ICD-10-CM | POA: Diagnosis not present

## 2014-08-23 DIAGNOSIS — Z951 Presence of aortocoronary bypass graft: Secondary | ICD-10-CM | POA: Diagnosis not present

## 2014-08-23 DIAGNOSIS — E1165 Type 2 diabetes mellitus with hyperglycemia: Secondary | ICD-10-CM | POA: Diagnosis not present

## 2014-08-23 DIAGNOSIS — Z01810 Encounter for preprocedural cardiovascular examination: Secondary | ICD-10-CM | POA: Diagnosis not present

## 2014-08-27 ENCOUNTER — Encounter: Payer: Self-pay | Admitting: Gastroenterology

## 2014-08-29 DIAGNOSIS — Z01810 Encounter for preprocedural cardiovascular examination: Secondary | ICD-10-CM | POA: Diagnosis not present

## 2014-08-29 DIAGNOSIS — I2510 Atherosclerotic heart disease of native coronary artery without angina pectoris: Secondary | ICD-10-CM | POA: Diagnosis not present

## 2014-08-31 DIAGNOSIS — I2510 Atherosclerotic heart disease of native coronary artery without angina pectoris: Secondary | ICD-10-CM | POA: Diagnosis not present

## 2014-08-31 DIAGNOSIS — Z01810 Encounter for preprocedural cardiovascular examination: Secondary | ICD-10-CM | POA: Diagnosis not present

## 2014-09-03 ENCOUNTER — Encounter (HOSPITAL_COMMUNITY): Payer: Self-pay

## 2014-09-03 NOTE — Pre-Procedure Instructions (Signed)
[name]  09/03/2014   Your procedure is scheduled on:  Thursday Sep 06, 2014 at 1:00 PM.  Report to [facility] Admitting at 11:00 AM.  Call this number if you have problems the morning of surgery: [phone]   Remember:   Do not eat food or drink liquids after midnight.   Take these medicines the morning of surgery with A SIP OF WATER: Amlodipine (Norvasc), Gabapentin (Neurontin),  Metoprolol (Lopressor)   Do NOT take any diabetic medications the morning of your surgery (ex. Metformin)   Do not wear jewelry.  Do not wear lotions, powders, or cologne.   Men may shave face and neck.  Do not bring valuables to the hospital.  [facility] is not responsible for any belongings or valuables.               Contacts, dentures or bridgework may not be worn into surgery.  Leave suitcase in the car. After surgery it may be brought to your room.  For patients admitted to the hospital, discharge time is determined by your treatment team.               Patients discharged the day of surgery will not be allowed to drive home.  Name and phone number of your driver:   Special Instructions: Shower using CHG soap the night before and the morning of your surgery   Please read over the following fact sheets that you were given: Pain Booklet, Coughing and Deep Breathing, MRSA Information and Surgical Site Infection Prevention

## 2014-09-04 ENCOUNTER — Encounter (HOSPITAL_COMMUNITY): Payer: Self-pay

## 2014-09-04 ENCOUNTER — Encounter (HOSPITAL_COMMUNITY)
Admission: RE | Admit: 2014-09-04 | Discharge: 2014-09-04 | Disposition: A | Discharge status: Home / Self Care | Payer: Medicare Other | Source: Ambulatory Visit | Attending: Orthopedic Surgery | Admitting: Orthopedic Surgery

## 2014-09-04 HISTORY — DX: Type 2 diabetes mellitus with diabetic neuropathy, unspecified: E11.40

## 2014-09-04 HISTORY — DX: Drug induced constipation: K59.03

## 2014-09-04 LAB — CBC
HCT: 42.6 % (ref 39.0–52.0)
Hemoglobin: 14.3 g/dL (ref 13.0–17.0)
MCH: 29.2 pg (ref 26.0–34.0)
MCHC: 33.6 g/dL (ref 30.0–36.0)
MCV: 87.1 fL (ref 78.0–100.0)
Platelets: 199 10*3/uL (ref 150–400)
RBC: 4.89 MIL/uL (ref 4.22–5.81)
RDW: 13.4 % (ref 11.5–15.5)
WBC: 5.2 10*3/uL (ref 4.0–10.5)

## 2014-09-04 LAB — SURGICAL PCR SCREEN
MRSA, PCR: NEGATIVE
Staphylococcus aureus: NEGATIVE

## 2014-09-04 LAB — GLUCOSE, CAPILLARY: Glucose-Capillary: 139 mg/dL — ABNORMAL HIGH (ref 65–99)

## 2014-09-04 LAB — BASIC METABOLIC PANEL
Anion gap: 7 (ref 5–15)
BUN: 14 mg/dL (ref 6–20)
CO2: 30 mmol/L (ref 22–32)
Calcium: 9.5 mg/dL (ref 8.9–10.3)
Chloride: 101 mmol/L (ref 101–111)
Creatinine, Ser: 0.74 mg/dL (ref 0.61–1.24)
GFR calc Af Amer: >60 mL/min (ref >60)
GFR calc non Af Amer: >60 mL/min (ref >60)
Glucose, Bld: 99 mg/dL (ref 65–99)
Potassium: 4.2 mmol/L (ref 3.5–5.1)
Sodium: 138 mmol/L (ref 135–145)

## 2014-09-04 NOTE — Progress Notes (Signed)
   09/04/14 1036  OBSTRUCTIVE SLEEP APNEA  Have you ever been diagnosed with sleep apnea through a sleep study? No  Do you snore loudly (loud enough to be heard through closed doors)?  1  Do you often feel tired, fatigued, or sleepy during the daytime? 0  Has anyone observed you stop breathing during your sleep? 0  Do you have, or are you being treated for high blood pressure? 1  BMI more than 35 kg/m2? 0  Age over 69 years old? 1  Neck circumference greater than 40 cm/16 inches? 1  Gender: 1  Obstructive Sleep Apnea Score 5   This patient has screened at risk for sleep apnea using the STOP bang tool used during a pre-surgical visit. A score of 4 or greater is at risk for sleep apnea.

## 2014-09-04 NOTE — Progress Notes (Signed)
PCP is Cammie Fulp and Cardiologist is Dr. Einar Gip. Patient informed Nurse that he had a stress test last Friday with Dr. Einar Gip. Will request records. Patient denied having any acute cardiac or pulmonary issues. Wife at chair side during PAT visit.   Nurse asked patient about fasting blood glucose levels and patient informed Nurse that he only checks his blood sugar before eating once a week and the highest it has been was 106 and the lowest was 94. CBG obtained today and it was 139. Patient stated he had consumed breakfast this morning.   Sleep apnea results sent to PCP.

## 2014-09-05 LAB — HEMOGLOBIN A1C
Hgb A1c MFr Bld: 6.4 % — ABNORMAL HIGH (ref 4.8–5.6)
Mean Plasma Glucose: 137 mg/dL

## 2014-09-05 MED ORDER — ACETAMINOPHEN 10 MG/ML IV SOLN
1000.0000 mg | INTRAVENOUS | Status: AC
  Administered 2014-09-06: 1000 mg via INTRAVENOUS
  Filled 2014-09-05: qty 100

## 2014-09-05 MED ORDER — CEFAZOLIN SODIUM-DEXTROSE 2-3 GM-% IV SOLR
2.0000 g | INTRAVENOUS | Status: AC
  Administered 2014-09-06: 2 g via INTRAVENOUS
  Filled 2014-09-05: qty 50

## 2014-09-05 NOTE — Progress Notes (Signed)
Anesthesia Chart Review:  Patient is a 69 year old male scheduled for L3-5 decompression on 09/06/14 by Dr. Rolena Infante.  History includes former smoker, CAD sp DES X 2 RCA 03/2004 with inferior STEMI with stent occlusion s/p thrombectomy/PTCA 02/2005 in the setting of suboptimal Plavix compliance and s/p CABG X 4 06/28/08 [facility]), HLD, prostate cancer s/p prostatectomy 02/2007, DM2 with neuropathy, depression. PCP is Dr. Antony Blackbird.  Cardiologist is Dr. Einar Gip [facility]).  Preoperative labs noted. A1C 6.4.    Cardiology records from [place] [facility]:  - 08/23/14 EKG: NSR, RBBB, LAD, inferior infarct (age undetermined).  - 08/31/14 Nuclear Stress Test: Resting ECG: NSR, RBBB. Stress ECG non-diagnostic for ischemia. Stress symptoms included dyspnea, dizziness. The perfusion imaging study demonstrates a moderate sized scar in the inferior, inferolateral and inferoapical, apical anterior region with mild peri-infarct ischemia espeically involving the lateral wall.  THe LV systolic function was mildly depressed at 46% with inferior wall hypokinesis. This represents an Intermediate risk. Clinical correlation recommended. No change since prior study (report comparison) from 11//15/05.    - 08/29/14 Echo: LV cavity is normal in size. Moderate concentric LVH. Normal global wall motion. Calculated EF 58%. LA cavity is mild to moderately dilated. No AR. Mild calcification of the AV annulus. Mild AV leaflet calcification. Mild MR/TR. No evidence of pulmonary hypertension.   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  Following the above tests, he was cleared for surgery from a cardiac standpoint by Neldon Labella AGNP-C with Dr. Einar Gip.  If no acute changes then I anticipate that he can proceed as planned.  George Hugh [facility] Short Stay Center/Anesthesiology Phone [phone] 09/05/2014 2:33 PM

## 2014-09-06 ENCOUNTER — Inpatient Hospital Stay (HOSPITAL_COMMUNITY): Payer: Medicare Other

## 2014-09-06 ENCOUNTER — Inpatient Hospital Stay (HOSPITAL_COMMUNITY)
Admission: RE | Admit: 2014-09-06 | Discharge: 2014-09-07 | DRG: 517 | Disposition: A | Discharge status: Home / Self Care | Payer: Medicare Other | Source: Ambulatory Visit | Attending: Orthopedic Surgery | Admitting: Orthopedic Surgery

## 2014-09-06 ENCOUNTER — Encounter (HOSPITAL_COMMUNITY)
Admission: RE | Disposition: A | Discharge status: Home / Self Care | Payer: PRIVATE HEALTH INSURANCE | Source: Ambulatory Visit | Attending: Orthopedic Surgery

## 2014-09-06 ENCOUNTER — Encounter (HOSPITAL_COMMUNITY): Payer: Self-pay | Admitting: *Deleted

## 2014-09-06 ENCOUNTER — Inpatient Hospital Stay (HOSPITAL_COMMUNITY): Payer: Medicare Other | Admitting: Anesthesiology

## 2014-09-06 ENCOUNTER — Inpatient Hospital Stay (HOSPITAL_COMMUNITY): Payer: Medicare Other | Admitting: Vascular Surgery

## 2014-09-06 DIAGNOSIS — E1140 Type 2 diabetes mellitus with diabetic neuropathy, unspecified: Secondary | ICD-10-CM | POA: Diagnosis present

## 2014-09-06 DIAGNOSIS — I2510 Atherosclerotic heart disease of native coronary artery without angina pectoris: Secondary | ICD-10-CM | POA: Diagnosis present

## 2014-09-06 DIAGNOSIS — I10 Essential (primary) hypertension: Secondary | ICD-10-CM | POA: Diagnosis present

## 2014-09-06 DIAGNOSIS — Z955 Presence of coronary angioplasty implant and graft: Secondary | ICD-10-CM

## 2014-09-06 DIAGNOSIS — C61 Malignant neoplasm of prostate: Secondary | ICD-10-CM | POA: Diagnosis present

## 2014-09-06 DIAGNOSIS — Z419 Encounter for procedure for purposes other than remedying health state, unspecified: Secondary | ICD-10-CM

## 2014-09-06 DIAGNOSIS — M48062 Spinal stenosis, lumbar region with neurogenic claudication: Secondary | ICD-10-CM | POA: Diagnosis present

## 2014-09-06 DIAGNOSIS — Z79899 Other long term (current) drug therapy: Secondary | ICD-10-CM | POA: Diagnosis not present

## 2014-09-06 DIAGNOSIS — M549 Dorsalgia, unspecified: Secondary | ICD-10-CM | POA: Diagnosis not present

## 2014-09-06 DIAGNOSIS — Z841 Family history of disorders of kidney and ureter: Secondary | ICD-10-CM

## 2014-09-06 DIAGNOSIS — Z87891 Personal history of nicotine dependence: Secondary | ICD-10-CM | POA: Diagnosis not present

## 2014-09-06 DIAGNOSIS — I252 Old myocardial infarction: Secondary | ICD-10-CM

## 2014-09-06 DIAGNOSIS — Z8249 Family history of ischemic heart disease and other diseases of the circulatory system: Secondary | ICD-10-CM | POA: Diagnosis not present

## 2014-09-06 DIAGNOSIS — M4806 Spinal stenosis, lumbar region: Secondary | ICD-10-CM | POA: Diagnosis present

## 2014-09-06 DIAGNOSIS — F329 Major depressive disorder, single episode, unspecified: Secondary | ICD-10-CM | POA: Diagnosis present

## 2014-09-06 DIAGNOSIS — E785 Hyperlipidemia, unspecified: Secondary | ICD-10-CM | POA: Diagnosis present

## 2014-09-06 DIAGNOSIS — Z833 Family history of diabetes mellitus: Secondary | ICD-10-CM | POA: Diagnosis not present

## 2014-09-06 DIAGNOSIS — Z7982 Long term (current) use of aspirin: Secondary | ICD-10-CM | POA: Diagnosis not present

## 2014-09-06 DIAGNOSIS — Z9889 Other specified postprocedural states: Secondary | ICD-10-CM | POA: Diagnosis not present

## 2014-09-06 HISTORY — PX: LUMBAR LAMINECTOMY/DECOMPRESSION MICRODISCECTOMY: SHX5026

## 2014-09-06 LAB — GLUCOSE, CAPILLARY
Glucose-Capillary: 114 mg/dL — ABNORMAL HIGH (ref 65–99)
Glucose-Capillary: 89 mg/dL (ref 65–99)
Glucose-Capillary: 106 mg/dL — ABNORMAL HIGH (ref 65–99)
Glucose-Capillary: 105 mg/dL — ABNORMAL HIGH (ref 65–99)

## 2014-09-06 SURGERY — LUMBAR LAMINECTOMY/DECOMPRESSION MICRODISCECTOMY 2 LEVELS
Anesthesia: General | Site: Spine Lumbar

## 2014-09-06 MED ORDER — METHOCARBAMOL 500 MG PO TABS: 500.0000 mg | ORAL_TABLET | Freq: Three times a day (TID) | ORAL | Status: DC | PRN

## 2014-09-06 MED ORDER — MEPERIDINE HCL 25 MG/ML IJ SOLN: 6.2500 mg | INTRAMUSCULAR | Status: DC | PRN

## 2014-09-06 MED ORDER — NEOSTIGMINE METHYLSULFATE 10 MG/10ML IV SOLN
INTRAVENOUS | Status: AC
  Filled 2014-09-06: qty 1

## 2014-09-06 MED ORDER — METHOCARBAMOL 1000 MG/10ML IJ SOLN
500.0000 mg | Freq: Four times a day (QID) | INTRAVENOUS | Status: DC | PRN
  Filled 2014-09-06: qty 5

## 2014-09-06 MED ORDER — INSULIN ASPART 100 UNIT/ML ~~LOC~~ SOLN: 0.0000 [IU] | SUBCUTANEOUS | Status: DC

## 2014-09-06 MED ORDER — LOSARTAN POTASSIUM 50 MG PO TABS: 100.0000 mg | ORAL_TABLET | Freq: Every day | ORAL | Status: DC

## 2014-09-06 MED ORDER — METFORMIN HCL ER 500 MG PO TB24: 1000.0000 mg | ORAL_TABLET | Freq: Every day | ORAL | Status: DC

## 2014-09-06 MED ORDER — INSULIN ASPART 100 UNIT/ML ~~LOC~~ SOLN: 0.0000 [IU] | Freq: Three times a day (TID) | SUBCUTANEOUS | Status: DC

## 2014-09-06 MED ORDER — OXYCODONE HCL 5 MG PO TABS: 10.0000 mg | ORAL_TABLET | ORAL | Status: DC | PRN

## 2014-09-06 MED ORDER — METFORMIN HCL ER 500 MG PO TB24
1000.0000 mg | ORAL_TABLET | Freq: Every day | ORAL | Status: DC
  Administered 2014-09-07: 1000 mg via ORAL
  Filled 2014-09-06 (×2): qty 2

## 2014-09-06 MED ORDER — BUPIVACAINE-EPINEPHRINE (PF) 0.25% -1:200000 IJ SOLN
INTRAMUSCULAR | Status: DC | PRN
  Administered 2014-09-06: 10 mL via PERINEURAL

## 2014-09-06 MED ORDER — AMLODIPINE BESYLATE 10 MG PO TABS
10.0000 mg | ORAL_TABLET | Freq: Every day | ORAL | Status: DC
  Administered 2014-09-07: 10 mg via ORAL
  Filled 2014-09-06: qty 1

## 2014-09-06 MED ORDER — CEFAZOLIN SODIUM 1-5 GM-% IV SOLN
1.0000 g | Freq: Three times a day (TID) | INTRAVENOUS | Status: AC
  Administered 2014-09-06 – 2014-09-07 (×2): 1 g via INTRAVENOUS
  Filled 2014-09-06: qty 50

## 2014-09-06 MED ORDER — LACTATED RINGERS IV SOLN: INTRAVENOUS | Status: DC

## 2014-09-06 MED ORDER — METHOCARBAMOL 500 MG PO TABS
ORAL_TABLET | ORAL | Status: AC
  Filled 2014-09-06: qty 1

## 2014-09-06 MED ORDER — MORPHINE SULFATE 2 MG/ML IJ SOLN: 1.0000 mg | INTRAMUSCULAR | Status: DC | PRN

## 2014-09-06 MED ORDER — NEOSTIGMINE METHYLSULFATE 10 MG/10ML IV SOLN
INTRAVENOUS | Status: DC | PRN
  Administered 2014-09-06: 4 mg via INTRAVENOUS

## 2014-09-06 MED ORDER — HYDROMORPHONE HCL 1 MG/ML IJ SOLN
INTRAMUSCULAR | Status: AC
  Filled 2014-09-06: qty 1

## 2014-09-06 MED ORDER — FLEET ENEMA 7-19 GM/118ML RE ENEM: 1.0000 | ENEMA | Freq: Once | RECTAL | Status: DC

## 2014-09-06 MED ORDER — SODIUM CHLORIDE 0.9 % IV SOLN: 250.0000 mL | INTRAVENOUS | Status: DC

## 2014-09-06 MED ORDER — SODIUM CHLORIDE 0.9 % IJ SOLN: 3.0000 mL | INTRAMUSCULAR | Status: DC | PRN

## 2014-09-06 MED ORDER — FENTANYL CITRATE (PF) 100 MCG/2ML IJ SOLN
INTRAMUSCULAR | Status: DC | PRN
  Administered 2014-09-06 (×2): 50 ug via INTRAVENOUS

## 2014-09-06 MED ORDER — THROMBIN 20000 UNITS EX KIT
PACK | CUTANEOUS | Status: DC | PRN
  Administered 2014-09-06: 20 mL via TOPICAL

## 2014-09-06 MED ORDER — MIDAZOLAM HCL 5 MG/5ML IJ SOLN
INTRAMUSCULAR | Status: DC | PRN
  Administered 2014-09-06: 2 mg via INTRAVENOUS

## 2014-09-06 MED ORDER — ONDANSETRON HCL 4 MG/2ML IJ SOLN
INTRAMUSCULAR | Status: AC
  Filled 2014-09-06: qty 2

## 2014-09-06 MED ORDER — PHENYLEPHRINE 40 MCG/ML (10ML) SYRINGE FOR IV PUSH (FOR BLOOD PRESSURE SUPPORT)
PREFILLED_SYRINGE | INTRAVENOUS | Status: AC
  Filled 2014-09-06: qty 10

## 2014-09-06 MED ORDER — ARTIFICIAL TEARS OP OINT
TOPICAL_OINTMENT | OPHTHALMIC | Status: DC | PRN
  Administered 2014-09-06: 1 via OPHTHALMIC

## 2014-09-06 MED ORDER — PROMETHAZINE HCL 25 MG/ML IJ SOLN: 6.2500 mg | INTRAMUSCULAR | Status: DC | PRN

## 2014-09-06 MED ORDER — GLYCOPYRROLATE 0.2 MG/ML IJ SOLN
INTRAMUSCULAR | Status: DC | PRN
  Administered 2014-09-06: 0.6 mg via INTRAVENOUS

## 2014-09-06 MED ORDER — LACTATED RINGERS IV SOLN
INTRAVENOUS | Status: DC
  Administered 2014-09-06 (×3): via INTRAVENOUS

## 2014-09-06 MED ORDER — ONDANSETRON HCL 4 MG PO TABS: 4.0000 mg | ORAL_TABLET | Freq: Three times a day (TID) | ORAL | Status: DC | PRN

## 2014-09-06 MED ORDER — METHOCARBAMOL 500 MG PO TABS
500.0000 mg | ORAL_TABLET | Freq: Four times a day (QID) | ORAL | Status: DC | PRN
  Administered 2014-09-06 – 2014-09-07 (×2): 500 mg via ORAL
  Filled 2014-09-06: qty 1

## 2014-09-06 MED ORDER — 0.9 % SODIUM CHLORIDE (POUR BTL) OPTIME
TOPICAL | Status: DC | PRN
  Administered 2014-09-06: 1000 mL

## 2014-09-06 MED ORDER — HYDROMORPHONE HCL 1 MG/ML IJ SOLN
0.2500 mg | INTRAMUSCULAR | Status: DC | PRN
  Administered 2014-09-06: 0.5 mg via INTRAVENOUS

## 2014-09-06 MED ORDER — PROPOFOL 10 MG/ML IV BOLUS
INTRAVENOUS | Status: DC | PRN
  Administered 2014-09-06: 160 mg via INTRAVENOUS

## 2014-09-06 MED ORDER — BUPIVACAINE-EPINEPHRINE (PF) 0.25% -1:200000 IJ SOLN
INTRAMUSCULAR | Status: AC
  Filled 2014-09-06: qty 30

## 2014-09-06 MED ORDER — ONDANSETRON HCL 4 MG/2ML IJ SOLN
INTRAMUSCULAR | Status: DC | PRN
  Administered 2014-09-06: 4 mg via INTRAVENOUS

## 2014-09-06 MED ORDER — EPHEDRINE SULFATE 50 MG/ML IJ SOLN
INTRAMUSCULAR | Status: DC | PRN
  Administered 2014-09-06 (×3): 10 mg via INTRAVENOUS

## 2014-09-06 MED ORDER — PHENOL 1.4 % MT LIQD: 1.0000 | OROMUCOSAL | Status: DC | PRN

## 2014-09-06 MED ORDER — ATORVASTATIN CALCIUM 20 MG PO TABS
20.0000 mg | ORAL_TABLET | Freq: Every day | ORAL | Status: DC
  Administered 2014-09-07: 20 mg via ORAL
  Filled 2014-09-06: qty 1

## 2014-09-06 MED ORDER — ACETAMINOPHEN 10 MG/ML IV SOLN: 1000.0000 mg | Freq: Once | INTRAVENOUS | Status: DC | PRN

## 2014-09-06 MED ORDER — METOPROLOL TARTRATE 12.5 MG HALF TABLET
12.5000 mg | ORAL_TABLET | Freq: Every day | ORAL | Status: DC
  Administered 2014-09-07: 12.5 mg via ORAL
  Filled 2014-09-06: qty 1

## 2014-09-06 MED ORDER — PHENYLEPHRINE HCL 10 MG/ML IJ SOLN
INTRAMUSCULAR | Status: AC
  Filled 2014-09-06: qty 1

## 2014-09-06 MED ORDER — MENTHOL 3 MG MT LOZG: 1.0000 | LOZENGE | OROMUCOSAL | Status: DC | PRN

## 2014-09-06 MED ORDER — ONDANSETRON HCL 4 MG/2ML IJ SOLN: 4.0000 mg | INTRAMUSCULAR | Status: DC | PRN

## 2014-09-06 MED ORDER — HEMOSTATIC AGENTS (NO CHARGE) OPTIME
TOPICAL | Status: DC | PRN
  Administered 2014-09-06: 1 via TOPICAL

## 2014-09-06 MED ORDER — ACETAMINOPHEN 500 MG PO TABS
1000.0000 mg | ORAL_TABLET | Freq: Four times a day (QID) | ORAL | Status: DC
  Administered 2014-09-06 – 2014-09-07 (×3): 1000 mg via ORAL
  Filled 2014-09-06 (×4): qty 2

## 2014-09-06 MED ORDER — LOSARTAN POTASSIUM 50 MG PO TABS
100.0000 mg | ORAL_TABLET | Freq: Every day | ORAL | Status: DC
  Administered 2014-09-07: 100 mg via ORAL
  Filled 2014-09-06: qty 2

## 2014-09-06 MED ORDER — ACETAMINOPHEN 10 MG/ML IV SOLN
INTRAVENOUS | Status: AC
  Filled 2014-09-06: qty 100

## 2014-09-06 MED ORDER — PROPOFOL 10 MG/ML IV BOLUS
INTRAVENOUS | Status: AC
  Filled 2014-09-06: qty 20

## 2014-09-06 MED ORDER — THROMBIN 20000 UNITS EX SOLR
CUTANEOUS | Status: AC
  Filled 2014-09-06: qty 20000

## 2014-09-06 MED ORDER — DOCUSATE SODIUM 100 MG PO CAPS: 100.0000 mg | ORAL_CAPSULE | Freq: Three times a day (TID) | ORAL | Status: DC | PRN

## 2014-09-06 MED ORDER — SODIUM CHLORIDE 0.9 % IJ SOLN: 3.0000 mL | Freq: Two times a day (BID) | INTRAMUSCULAR | Status: DC

## 2014-09-06 MED ORDER — FENTANYL CITRATE (PF) 250 MCG/5ML IJ SOLN
INTRAMUSCULAR | Status: AC
  Filled 2014-09-06: qty 5

## 2014-09-06 MED ORDER — VECURONIUM BROMIDE 10 MG IV SOLR
INTRAVENOUS | Status: DC | PRN
  Administered 2014-09-06: 6 mg via INTRAVENOUS

## 2014-09-06 MED ORDER — MIDAZOLAM HCL 2 MG/2ML IJ SOLN
INTRAMUSCULAR | Status: AC
  Filled 2014-09-06: qty 2

## 2014-09-06 MED ORDER — GLYCOPYRROLATE 0.2 MG/ML IJ SOLN
INTRAMUSCULAR | Status: AC
  Filled 2014-09-06: qty 3

## 2014-09-06 MED ORDER — ACETAMINOPHEN 10 MG/ML IV SOLN: 1000.0000 mg | Freq: Four times a day (QID) | INTRAVENOUS | Status: DC

## 2014-09-06 MED ORDER — LIDOCAINE HCL (CARDIAC) 20 MG/ML IV SOLN
INTRAVENOUS | Status: DC | PRN
  Administered 2014-09-06: 50 mg via INTRAVENOUS

## 2014-09-06 MED ORDER — PHENYLEPHRINE HCL 10 MG/ML IJ SOLN
10.0000 mg | INTRAVENOUS | Status: DC | PRN
  Administered 2014-09-06: 20 ug/min via INTRAVENOUS

## 2014-09-06 MED ORDER — MAGNESIUM CITRATE PO SOLN: 0.5000 | Freq: Once | ORAL | Status: DC

## 2014-09-06 MED ORDER — PHENYLEPHRINE HCL 10 MG/ML IJ SOLN
INTRAMUSCULAR | Status: DC | PRN
  Administered 2014-09-06 (×4): 40 ug via INTRAVENOUS

## 2014-09-06 MED ORDER — OXYCODONE-ACETAMINOPHEN 10-325 MG PO TABS: 1.0000 | ORAL_TABLET | ORAL | Status: DC | PRN

## 2014-09-06 MED ORDER — HYDROCHLOROTHIAZIDE 25 MG PO TABS
25.0000 mg | ORAL_TABLET | Freq: Every day | ORAL | Status: DC
  Administered 2014-09-07: 25 mg via ORAL
  Filled 2014-09-06: qty 1

## 2014-09-06 SURGICAL SUPPLY — 57 items
BNDG GAUZE ELAST 4 BULKY (GAUZE/BANDAGES/DRESSINGS) ×2 IMPLANT
BUR EGG ELITE 4.0 (BURR) IMPLANT
CLSR STERI-STRIP ANTIMIC 1/2X4 (GAUZE/BANDAGES/DRESSINGS) ×1 IMPLANT
CORDS BIPOLAR (ELECTRODE) ×2 IMPLANT
COVER SURGICAL LIGHT HANDLE (MISCELLANEOUS) ×1 IMPLANT
DRAPE C-ARM 42X72 X-RAY (DRAPES) ×2 IMPLANT
DRAPE POUCH INSTRU U-SHP 10X18 (DRAPES) ×2 IMPLANT
DRAPE SURG 17X11 SM STRL (DRAPES) ×2 IMPLANT
DRAPE U-SHAPE 47X51 STRL (DRAPES) ×2 IMPLANT
DRSG MEPILEX BORDER 4X4 (GAUZE/BANDAGES/DRESSINGS) ×2 IMPLANT
DURAPREP 26ML APPLICATOR (WOUND CARE) ×2 IMPLANT
ELECT BLADE 4.0 EZ CLEAN MEGAD (MISCELLANEOUS) ×2
ELECT CAUTERY BLADE 6.4 (BLADE) ×2 IMPLANT
ELECT PENCIL ROCKER SW 15FT (MISCELLANEOUS) ×2 IMPLANT
ELECT REM PT RETURN 9FT ADLT (ELECTROSURGICAL) ×2
ELECTRODE BLDE 4.0 EZ CLN MEGD (MISCELLANEOUS) ×1 IMPLANT
ELECTRODE REM PT RTRN 9FT ADLT (ELECTROSURGICAL) ×1 IMPLANT
FLOSEAL (HEMOSTASIS) IMPLANT
GLOVE BIOGEL PI IND STRL 8 (GLOVE) ×1 IMPLANT
GLOVE BIOGEL PI IND STRL 8.5 (GLOVE) ×1 IMPLANT
GLOVE BIOGEL PI INDICATOR 8 (GLOVE) ×1
GLOVE BIOGEL PI INDICATOR 8.5 (GLOVE) ×1
GLOVE ORTHO TXT STRL SZ7.5 (GLOVE) ×2 IMPLANT
GLOVE SS BIOGEL STRL SZ 8.5 (GLOVE) ×1 IMPLANT
GLOVE SUPERSENSE BIOGEL SZ 8.5 (GLOVE) ×3
GOWN STRL REUS W/ TWL LRG LVL3 (GOWN DISPOSABLE) ×1 IMPLANT
GOWN STRL REUS W/TWL 2XL LVL3 (GOWN DISPOSABLE) ×4 IMPLANT
GOWN STRL REUS W/TWL LRG LVL3 (GOWN DISPOSABLE) ×2
KIT BASIN OR (CUSTOM PROCEDURE TRAY) ×2 IMPLANT
NDL SPNL 18GX3.5 QUINCKE PK (NEEDLE) ×2 IMPLANT
NEEDLE 22X1 1/2 (OR ONLY) (NEEDLE) ×1 IMPLANT
NEEDLE SPNL 18GX3.5 QUINCKE PK (NEEDLE) ×4 IMPLANT
NS IRRIG 1000ML POUR BTL (IV SOLUTION) ×2 IMPLANT
PACK LAMINECTOMY ORTHO (CUSTOM PROCEDURE TRAY) ×2 IMPLANT
PACK UNIVERSAL I (CUSTOM PROCEDURE TRAY) ×2 IMPLANT
PATTIES SURGICAL .5 X.5 (GAUZE/BANDAGES/DRESSINGS) IMPLANT
PATTIES SURGICAL .5 X1 (DISPOSABLE) ×2 IMPLANT
SPONGE LAP 4X18 X RAY DECT (DISPOSABLE) IMPLANT
SPONGE SURGIFOAM ABS GEL 100 (HEMOSTASIS) ×2 IMPLANT
STAPLER VISISTAT 35W (STAPLE) IMPLANT
STRIP CLOSURE SKIN 1/2X4 (GAUZE/BANDAGES/DRESSINGS) IMPLANT
SURGIFLO TRUKIT (HEMOSTASIS) ×1 IMPLANT
SUT BONE WAX W31G (SUTURE) ×2 IMPLANT
SUT MON AB 3-0 SH 27 (SUTURE) ×2
SUT MON AB 3-0 SH27 (SUTURE) ×1 IMPLANT
SUT VIC AB 1 CT1 18XCR BRD 8 (SUTURE) ×1 IMPLANT
SUT VIC AB 1 CT1 27 (SUTURE) ×4
SUT VIC AB 1 CT1 27XBRD ANTBC (SUTURE) ×2 IMPLANT
SUT VIC AB 1 CT1 8-18 (SUTURE) ×6
SUT VIC AB 2-0 CT1 18 (SUTURE) ×4 IMPLANT
SUT VICRYL 0 UR6 27IN ABS (SUTURE) ×2 IMPLANT
SYR BULB IRRIGATION 50ML (SYRINGE) ×2 IMPLANT
SYR CONTROL 10ML LL (SYRINGE) ×3 IMPLANT
TOWEL OR 17X26 10 PK STRL BLUE (TOWEL DISPOSABLE) ×4 IMPLANT
TRAY FOLEY CATH 16FRSI W/METER (SET/KITS/TRAYS/PACK) IMPLANT
WATER STERILE IRR 1000ML POUR (IV SOLUTION) ×2 IMPLANT
YANKAUER SUCT BULB TIP NO VENT (SUCTIONS) ×2 IMPLANT

## 2014-09-06 NOTE — OR Nursing (Signed)
Dilaudid 0.5 mg wasted with Sheran Luz.

## 2014-09-06 NOTE — Brief Op Note (Signed)
09/06/2014  5:22 PM  PATIENT:  [name]  69 y.o. male  PRE-OPERATIVE DIAGNOSIS:  SPINAL STENOSIS  POST-OPERATIVE DIAGNOSIS:  SPINAL STENOSIS  PROCEDURE:  Procedure(s): LUMBER DECOMPRESSION L3-5 (N/A)  SURGEON:  Surgeon(s) and Role:    * Melina Schools, MD - Primary  PHYSICIAN ASSISTANT:   ASSISTANTS: none   ANESTHESIA:   general  EBL:  Total I/O In: 1600 [I.V.:1600] Out: 50 [Blood:50]  BLOOD ADMINISTERED:none  DRAINS: none   LOCAL MEDICATIONS USED:  MARCAINE     SPECIMEN:  No Specimen  DISPOSITION OF SPECIMEN:  N/A  COUNTS:  YES  TOURNIQUET:  * No tourniquets in log *  DICTATION: .Other Dictation: Dictation Number [id number]  PLAN OF CARE: Admit to inpatient   PATIENT DISPOSITION:  PACU - hemodynamically stable.

## 2014-09-06 NOTE — Anesthesia Procedure Notes (Signed)
Procedure Name: Intubation Date/Time: 09/06/2014 2:10 PM Performed by: Suzy Bouchard Pre-anesthesia Checklist: Patient identified, Timeout performed, Emergency Drugs available, Suction available and Patient being monitored Patient Re-evaluated:Patient Re-evaluated prior to inductionOxygen Delivery Method: Circle system utilized Preoxygenation: Pre-oxygenation with 100% oxygen Intubation Type: IV induction Ventilation: Mask ventilation without difficulty Laryngoscope Size: Miller and 2 Grade View: Grade I Tube type: Oral Tube size: 7.5 mm Number of attempts: 1 Airway Equipment and Method: Stylet Placement Confirmation: ETT inserted through vocal cords under direct vision,  breath sounds checked- equal and bilateral and positive ETCO2 Secured at: 22 cm Tube secured with: Tape Dental Injury: Teeth and Oropharynx as per pre-operative assessment

## 2014-09-06 NOTE — Anesthesia Preprocedure Evaluation (Addendum)
Anesthesia Evaluation  Patient identified by MRN, date of birth, ID band Patient awake    Reviewed: Allergy & Precautions, NPO status , Patient's Chart, lab work & pertinent test results  Airway Mallampati: II  TM Distance: >3 FB Neck ROM: Full    Dental no notable dental hx.    Pulmonary neg pulmonary ROS, former smoker,  breath sounds clear to auscultation  Pulmonary exam normal       Cardiovascular hypertension, Pt. on medications + CAD, + CABG and + Peripheral Vascular Disease Normal cardiovascular examRhythm:Regular Rate:Normal     Neuro/Psych PSYCHIATRIC DISORDERS Depression negative neurological ROS     GI/Hepatic negative GI ROS, Neg liver ROS,   Endo/Other  diabetes, Type 2, Oral Hypoglycemic Agents  Renal/GU negative Renal ROS     Musculoskeletal negative musculoskeletal ROS (+)   Abdominal   Peds  Hematology negative hematology ROS (+)   Anesthesia Other Findings   Reproductive/Obstetrics negative OB ROS                            Anesthesia Physical Anesthesia Plan  ASA: III  Anesthesia Plan: General   Post-op Pain Management:    Induction: Intravenous  Airway Management Planned: Oral ETT  Additional Equipment: None  Intra-op Plan:   Post-operative Plan: Extubation in OR  Informed Consent: I have reviewed the patients History and Physical, chart, labs and discussed the procedure including the risks, benefits and alternatives for the proposed anesthesia with the patient or authorized representative who has indicated his/her understanding and acceptance.   Dental advisory given  Plan Discussed with: CRNA  Anesthesia Plan Comments:         Anesthesia Quick Evaluation

## 2014-09-06 NOTE — Transfer of Care (Signed)
Immediate Anesthesia Transfer of Care Note  Patient: [name]  Procedure(s) Performed: Procedure(s): LUMBER DECOMPRESSION L3-5 (N/A)  Patient Location: PACU  Anesthesia Type:General  Level of Consciousness: awake, alert  and oriented  Airway & Oxygen Therapy: Patient Spontanous Breathing and Patient connected to nasal cannula oxygen  Post-op Assessment: Report given to RN, Post -op Vital signs reviewed and stable and Patient moving all extremities X 4  Post vital signs: Reviewed and stable  Last Vitals:  Filed Vitals:   09/06/14 1725  BP: 110/58  Pulse:   Temp:   Resp: 12    Complications: No apparent anesthesia complications

## 2014-09-06 NOTE — H&P (Signed)
History of Present Illness  The patient is a 69 year old male who presents for a Follow-up for Transition into care. The patient is transitioning into care from another physician (Dr Vickki Hearing for back pain and has had an MRI) .  Back complaints is described as the following: The patient is seen today in referral from [place] . The patient reports pain, weakness and decreased range of motion involving the low back and right lower back which began 1 month(s) ago following a specific injury (pt. was cleaning the shower door after taking a shower, felt a pop). The injury occurred due to flexion, squeegeeing shower while the patient was at home. The patient describes their pain as sharp and burning rating it as severe and feels as if they are unchanged. The symptoms occur frequently. Symptoms are worsened by flexion. Symptoms are improved with restricted activity. Current treatment includes opioid analgesics (hydrocodone not helping) and Pt. had injections in the past after the back surgery in 2002. Prior to being seen today the patient was previous evaluated Pt. had 08/10/2000 Lumbar lam with micro disk. by Ellene Route. Past evaluation has included MRI . Note for "Back complaints": Pain from low back to foot, feels unable to dorsiflex foot, leg feels weak. He has a history of low back surgery in 2002. He had some recurrence of pain and subsequent steroid injections into his back.  Allergies  No Known Drug Allergies08/04/2012  Family History  Cancer father Diabetes Mellitus grandmother mothers side and grandmother fathers side Kidney disease mother Hypertension First Degree Relatives. father  Social History Tobacco use Former smoker. former smoker; smoke(d) 3 or more pack(s) per day Tobacco / smoke exposure no Alcohol use current drinker; drinks beer, wine and hard liquor; less than 5 per week Children 4 Current work status working part time Drug/Alcohol Rehab (Currently)  no Exercise Exercises weekly; does running / walking Illicit drug use no Living situation live with spouse Marital status married Most recent primary occupation parts person [employer] Number of flights of stairs before winded 2-3 Pain Contract no Previously in rehab no  Medication History  Gabapentin (100MG  Capsule, Oral) Active. Hydrocodone-Acetaminophen (5-325MG  Tablet, Oral) Active. (for shingles) AmLODIPine Besylate (10MG  Tablet, Oral) Active. Hydrochlorothiazide (25MG  Tablet, Oral) Active. Losartan Potassium-HCTZ (100-12.5MG  Tablet, Oral) Active. Metoprolol Succinate ER (25MG  Tablet ER 24HR, Oral) Active. Aspirin EC (81MG  Tablet DR, Oral) Active. MetFORMIN HCl (1000MG  Tablet, Oral) Active. Atorvastatin Calcium (20MG  Tablet, Oral) Active. Norco (10-325MG  Tablet, 1 (one) Tablet Oral every 6-8 hours as needed for pain, Taken starting 07/30/2014) Inactive. Medications Reconciled  Vitals (Velmena H. Pugh; 08/17/2014 10:51 AM) 08/17/2014 10:51 AM Pain Level: 9/10  Objective Transcription He is a pleasant gentleman who appears his stated age, in no acute distress. He is alert. He is oriented x3. He has a positive right straight leg raise test, 4/5 EHL and tibialis anterior strength, dysesthesias in the L5 and L4 distribution in the right lower extremity. He has horrific pain when he extends the spine, significant relief of his back, buttock and proximal hamstring pain with forward flexion. No hip, knee or ankle pain with joint range of motion, intact peripheral pulses. Compartments are soft and nontender. No loss in bowel or bladder control. No shortness of breath or chest pain. He is alert and oriented x3.  RADIOGRAPHS His MRI from 08/10/2014 showed severe spinal stenosis at L3-4 with significant bilateral foraminal stenosis. He has got moderate spinal stenosis at L4-5 with severe foraminal stenosis at L4-5. He also  has severe foraminal at  L5-S1.    Assessment & Plan  At this point in time clinically, he is having neurogenic claudication and radiculopathy primarily I believe from the L3-4 and L4-5 disease levels. He has no calf pain or burning on the sole of the feet or weakness of his gastrocnemius to suggest S1 nerve irritation. His principal problem is L5 distribution pain from the L4-5 lateral recess stenosis and significant back, buttock and lateral thigh pain from the severe stenosis he has at L3-4. After reviewing the MRI and talking to the patient and his wife, they will contemplate their treatment option. Given the motor deficit, I am favoring moving forward with surgery. He has had a previous L5-S1 decompression in 2002 and did well with that. The surgery this time would be at L3-4, L4-5 open lumbar decompression with foraminal decompression at L3-4, L4-5 most extensively on the right side. The risks include infection, bleeding, nerve damage, death, stroke, paralysis, failure to heal, need for further surgery, ongoing or worst pain, loss in bowel and bladder control, recurrent spinal stenosis, need for fusion surgery in the future. All of his questions were addressed. We will plan on moving forward with surgery if they decide in the very near future.

## 2014-09-07 LAB — GLUCOSE, CAPILLARY: Glucose-Capillary: 92 mg/dL (ref 65–99)

## 2014-09-07 MED FILL — Thrombin For Soln 20000 Unit: CUTANEOUS | Qty: 1 | Status: AC

## 2014-09-07 NOTE — Progress Notes (Signed)
Patient alert and oriented, mae's well, voiding adequate amount of urine, swallowing without difficulty, no c/o pain. Patient discharged home with family. Script and discharged instructions given to patient. Patient and family stated understanding of d/c instructions given and has an appointment with MD. 

## 2014-09-07 NOTE — Op Note (Signed)
NAMECOLIE, [name]             ACCOUNT NO.:  [id number]  MEDICAL RECORD NO.:  [id number]  LOCATION:  [id number]                        FACILITY:  [facility]  PHYSICIAN:  Wilson Dusenbery D. Rolena Infante, M.D. DATE OF BIRTH:  09-08-1945  DATE OF PROCEDURE:  09/06/2014 DATE OF DISCHARGE:                              OPERATIVE REPORT   PREOPERATIVE DIAGNOSIS:  Lumbar spinal stenosis, L3-4 and L4-5.  POSTOPERATIVE DIAGNOSIS:  Lumbar spinal stenosis, L3-4 and L4-5.  OPERATIVE PROCEDURE:  L3-5 posterior decompression.  COMPLICATIONS:  None.  CONDITION:  Stable.  HISTORY:  This is a very pleasant 69 year old gentleman who has been having severe back and neurogenic claudication (on the right side).  The patient has tried and failed conservative management.  He had a previous L5-S1 diskectomy in the past and he did well, but now he is having horrific debilitating back, buttock, and right leg pain, which is hindering his quality of life.  After discussing treatment options and reviewing his imaging studies, we elected to move forward with the lumbar decompression.  All appropriate risks, benefits, and alternatives of surgery were discussed and consent was obtained.  OPERATIVE NOTE:  The patient was brought to the operating room and placed supine on the operating table.  After successful induction of general anesthesia and endotracheal intubation, TEDs and SCDs were applied.  He was turned prone onto the [place] frame.  All bony prominences were well padded.  The back was prepped and draped in standard fashion.  Time-out was taken to confirm the patient, procedure, and all other pertinent important data.  Midline incision was made starting at the superior aspect of L3 and proceeding down to the superior aspect of S1.  Sharp dissection was carried out down to the deep fascia.  Deep fascia was sharply incised and exposed the lamina and spinous process of L3 and L4 and the superior portion of that of L5. Once  this was done bilaterally, self-retaining retractors were placed into the wound and a Penfield 4 was placed underneath the L3 lamina. Intraoperative lateral x-ray was taken confirming the appropriate level. Once this was done, using Leksell rongeur, I removed all of the L4 and the majority of the L3 spinous process.  There was significant facet arthrosis at both levels, 3-4 and L4-5.  I eventually developed a plane underneath the lamina of L3 and using a 3-mm Kerrison, performed a very generous central laminotomy.  There was significant stenosis, consistent with what was seen on the MRI.  We used a Penfield 4 to dissect through the central raphe and develop a plane between the thecal sac and the ligamentum flavum.  I removed this on the left-hand side and right side to create my central decompression at L3.  There was marked expansion of the thecal sac following this decompression.  I took my decompression up to the level of the L3 pedicle consistent with where the spinal stenosis was seen on imaging studies.  I then went into the lateral recess, removing the thickened ligamentum flavum and osteophyte in the lateral recess with my 2 and 3-mm Kerrison punch bilaterally.  I then identified the traversing L4 nerve root and traced it down to the foramen.  At this  point, I then went down to the L4-5 space and then used my 3-mm Kerrison to start creating my laminectomy defect.  There was significant adhesions mainly on the central and right hand side to the thecal sac. As such, I went to the left and continued my decompression on this side. Ultimately, I completed the laminectomy using a 3-mm and 2-mm Kerrison punch.  I then had completed 3-5 decompression on this left side and centrally, and then completed my lateral recess decompression, performing a foraminotomy of L4, tracing down the L4-5 lateral recess and resecting all the overhanging osteophyte and fell on the L5 nerve root into the  foramen and performing a foraminotomy there.  I decompressed all the way out to the medial border of the pedicle.  With the left side complete, I then began dissecting through the thickened scar at the L3-4 and L4-5 level on the right side.  There was marked compression.  Gently with Penfield 4 and neural curettes, I was able to release the scarred ligamentum flavum and then used 2 and 3-mm punch to complete my laminectomy of L4 and the lateral recess decompression. There was marked compression noted even into the lateral recess.  Once I was able to palpate and visualize the medial border of the pedicle indicating I had an adequate decompression.  Using 2-mm Kerrison punches, I performed foraminotomies for L4 and L5.  At this point, I then took another x-ray this time with spanning my decompression site confirming again that I had an adequate decompression.  Once I confirmed I treated the appropriate level and had an adequate decompression, I then irrigated the wound copiously with normal saline.  I made sure I had hemostasis using bipolar electrocautery and then closed the deep fascia with a thrombin-soaked Gelfoam patty over the thecal sac with interrupted #1 Vicryl sutures, superficial with 2-0 Vicryl sutures, 3-0 Monocryl for the skin.  Steri-Strips and dry dressing were applied and the patient was extubated, transferred to the PACU without incident.  At the end of the case, all needle and sponge counts were correct.     Naarah Borgerding D. Rolena Infante, M.D.     DDB/MEDQ  D:  09/06/2014  T:  09/07/2014  Job:  [id number]

## 2014-09-07 NOTE — Discharge Summary (Signed)
Patient ID: [name] MRN: [id number] DOB/AGE: 1946-02-11 69 y.o.  Admit date: 09/06/2014 Discharge date: 09/07/2014  Admission Diagnoses:  Active Problems:   Spinal stenosis   Discharge Diagnoses:  Active Problems:   Spinal stenosis  status post Procedure(s): LUMBER DECOMPRESSION L3-5  Past Medical History  Diagnosis Date  . ASCVD (arteriosclerotic cardiovascular disease)     03/2004: DES x2 to the RCA; IMI with stent occlusion in 02/2005- suboptimal Plavix compliance  . Hypertension   . Tobacco abuse   . Hyperlipidemia   . Morbid obesity   . Mild depression   . Carcinoma of prostate     Clopidogrel held for biopsy  . CAD (coronary artery disease)   . Diabetes mellitus without complication   . Constipation due to pain medication   . Diabetic neuropathy     Surgeries: Procedure(s): LUMBER DECOMPRESSION L3-5 on 09/06/2014   Consultants:    Discharged Condition: Improved  Hospital Course: [name] is an 69 y.o. male who was admitted 09/06/2014 for operative treatment of spinal stenosis. Patient failed conservative treatments (please see the history and physical for the specifics) and had severe unremitting pain that affects sleep, daily activities and work/hobbies. After pre-op clearance, the patient was taken to the operating room on 09/06/2014 and underwent  Procedure(s): LUMBER DECOMPRESSION L3-5.    Patient was given perioperative antibiotics: Anti-infectives    Start     Dose/Rate Route Frequency Ordered Stop   09/06/14 1945  ceFAZolin (ANCEF) IVPB 1 g/50 mL premix     1 g 100 mL/hr over 30 Minutes Intravenous Every 8 hours 09/06/14 1936 09/07/14 0304   09/06/14 1230  ceFAZolin (ANCEF) IVPB 2 g/50 mL premix     2 g 100 mL/hr over 30 Minutes Intravenous To Surgery 09/05/14 1055 09/06/14 1411       Patient was given sequential compression devices and early ambulation to prevent DVT.   Patient benefited maximally from hospital stay and there  were no complications. At the time of discharge, the patient was urinating/moving their bowels without difficulty, tolerating a regular diet, pain is controlled with oral pain medications and they have been cleared by PT/OT.   Recent vital signs: Patient Vitals for the past 24 hrs:  BP Temp Temp src Pulse Resp SpO2 Height Weight  09/07/14 0400 (!) 120/55 mmHg 98.6 F (37 C) Oral 71 20 95 % - -  09/06/14 2349 (!) 138/58 mmHg 98.4 F (36.9 C) Oral 76 18 97 % - -  09/06/14 1945 117/60 mmHg 98.1 F (36.7 C) Oral (!) 57 - 98 % - -  09/06/14 1915 113/60 mmHg 98.7 F (37.1 C) - (!) 59 - 98 % - -  09/06/14 1900 (!) 105/58 mmHg - - (!) 55 - 100 % - -  09/06/14 1845 (!) 101/58 mmHg - - (!) 55 - 99 % - -  09/06/14 1830 (!) 101/58 mmHg - - (!) 56 - 99 % - -  09/06/14 1815 (!) 109/59 mmHg - - (!) 56 - 99 % - -  09/06/14 1800 (!) 109/59 mmHg - - (!) 58 - 99 % - -  09/06/14 1745 (!) 108/59 mmHg - - (!) 59 13 99 % - -  09/06/14 1730 - - - 64 10 100 % - -  09/06/14 1725 (!) 110/58 mmHg - - - 12 100 % - -  09/06/14 1724 - 98.1 F (36.7 C) - - - - - -  09/06/14 1122 (!) 158/91 mmHg  97.6 F (36.4 C) Oral 73 18 96 % 6\' 2"  (1.88 m) 100.562 kg (221 lb 11.2 oz)     Recent laboratory studies:  Recent Labs  09/04/14 1059  WBC 5.2  HGB 14.3  HCT 42.6  PLT 199  NA 138  K 4.2  CL 101  CO2 30  BUN 14  CREATININE 0.74  GLUCOSE 99  CALCIUM 9.5     Discharge Medications:     Medication List    STOP taking these medications        gabapentin 300 MG capsule  Commonly known as:  NEURONTIN     HYDROcodone-acetaminophen 10-325 MG per tablet  Commonly known as:  NORCO      TAKE these medications        amLODipine 10 MG tablet  Commonly known as:  NORVASC  Take 10 mg by mouth daily.     aspirin EC 81 MG tablet  Take 81 mg by mouth daily.     atorvastatin 20 MG tablet  Commonly known as:  LIPITOR  Take 20 mg by mouth daily.     docusate sodium 100 MG capsule  Commonly known as:  COLACE   Take 1 capsule (100 mg total) by mouth 3 (three) times daily as needed for mild constipation.     hydrochlorothiazide 25 MG tablet  Commonly known as:  HYDRODIURIL  Take 25 mg by mouth daily.     losartan 100 MG tablet  Commonly known as:  COZAAR  Take 100 mg by mouth daily.     metFORMIN 1000 MG (MOD) 24 hr tablet  Commonly known as:  GLUMETZA  Take 1,000 mg by mouth daily with breakfast.     methocarbamol 500 MG tablet  Commonly known as:  ROBAXIN  Take 1 tablet (500 mg total) by mouth 3 (three) times daily as needed for muscle spasms.     metoprolol tartrate 25 MG tablet  Commonly known as:  LOPRESSOR  Take 12.5 mg by mouth daily.     ondansetron 4 MG tablet  Commonly known as:  ZOFRAN  Take 1 tablet (4 mg total) by mouth every 8 (eight) hours as needed for nausea or vomiting.     oxyCODONE-acetaminophen 10-325 MG per tablet  Commonly known as:  PERCOCET  Take 1 tablet by mouth every 4 (four) hours as needed for pain.        Diagnostic Studies: Dg Lumbar Spine 2-3 Views  09/06/2014   ADDENDUM REPORT: 09/06/2014 16:53  ADDENDUM: Additional cross-table lateral image with 2 surgical curette.  Tip of the superior curette identifies the posterior cortex of the L5 vertebral body.  Tip of the inferior curette identifies the L3 foramen.  Signed,  Dulcy Fanny. Earleen Newport, DO  [facility]  [facility]   Electronically Signed   By: Corrie Mckusick D.O.   On: 09/06/2014 16:53   09/06/2014   CLINICAL DATA:  69 year old male with a history of lumbar decompression.  EXAM: PORTABLE SPINE - 1 VIEW; LUMBAR SPINE - 2-3 VIEW  COMPARISON:  MRI 07/16/2008  FINDINGS: Two cross-table lateral intraoperative plain film for lumbar back surgery.  1 image demonstrates a surgical curette identifying the pedicle level of L4 with soft tissue retractors overlying the posterior elements.  Additional image demonstrates surgical probe within the posterior elements,  identifying the L3 vertebral body.  IMPRESSION: Intraoperative cross-table lateral plain film demonstrates surgical probe identifying the L3 vertebral body and a surgical curette identifying the pedicle of  L4.  Please refer to the dictated operative report for full details of intraoperative findings and procedure.  Signed,  Dulcy Fanny. Earleen Newport, DO  [facility]  [facility]  Electronically Signed: By: Corrie Mckusick D.O. On: 09/06/2014 15:11   Dg Spine Portable 1 View  09/06/2014   ADDENDUM REPORT: 09/06/2014 16:53  ADDENDUM: Additional cross-table lateral image with 2 surgical curette.  Tip of the superior curette identifies the posterior cortex of the L5 vertebral body.  Tip of the inferior curette identifies the L3 foramen.  Signed,  Dulcy Fanny. Earleen Newport, DO  [facility]  [facility]   Electronically Signed   By: Corrie Mckusick D.O.   On: 09/06/2014 16:53   09/06/2014   CLINICAL DATA:  69 year old male with a history of lumbar decompression.  EXAM: PORTABLE SPINE - 1 VIEW; LUMBAR SPINE - 2-3 VIEW  COMPARISON:  MRI 07/16/2008  FINDINGS: Two cross-table lateral intraoperative plain film for lumbar back surgery.  1 image demonstrates a surgical curette identifying the pedicle level of L4 with soft tissue retractors overlying the posterior elements.  Additional image demonstrates surgical probe within the posterior elements, identifying the L3 vertebral body.  IMPRESSION: Intraoperative cross-table lateral plain film demonstrates surgical probe identifying the L3 vertebral body and a surgical curette identifying the pedicle of L4.  Please refer to the dictated operative report for full details of intraoperative findings and procedure.  Signed,  Dulcy Fanny. Earleen Newport, DO  [facility]  [facility]  Electronically Signed: By: Corrie Mckusick D.O. On: 09/06/2014 15:11          Follow-up  Information    Follow up with Melina Schools D, MD. Schedule an appointment as soon as possible for a visit in 2 weeks.   Specialty:  Orthopedic Surgery   Why:  If symptoms worsen, For suture removal, For wound re-check   Contact information:   [address] [place] [address] [phone]       Discharge Plan:  discharge to home  Disposition: hospital course uneventful. F/u 2 weeks    Signed: Melina Schools D for Dr. Melina Schools [facility] [phone] 09/07/2014, 7:46 AM

## 2014-09-07 NOTE — Evaluation (Signed)
Physical Therapy Evaluation Patient Details Name: [name] MRN: [id number] DOB: 26-Dec-1945 Today's Date: 09/07/2014   History of Present Illness  Pt is a 68 y/o male admitted s/p L3-L5 posterior decompression.   Clinical Impression  Pt admitted with above diagnosis. Pt currently with functional limitations due to the deficits listed below (see PT Problem List). At the time of PT eval pt was able to perform transfers and ambulation with supervision. Pt with antalgic gait pattern due to radiating pain prior to surgery. Has been using cane for balance and energy conservation. Encouraged pt to only use cane when out in the community or walking for exercise as he is doing well without it for shorter distances. Pt was educated on back precautions and activity modification to maintain those precautions. Will benefit from reinforcement of precautions during ADL's from OT prior to d/c. Pt will benefit from skilled PT to increase their independence and safety with mobility to allow discharge to the venue listed below.       Follow Up Recommendations Outpatient PT (As appropriate per post-op protocol. )    Equipment Recommendations  None recommended by PT    Recommendations for Other Services       Precautions / Restrictions Precautions Precautions: Fall;Back Required Braces or Orthoses: Spinal Brace Spinal Brace: Lumbar corset Restrictions Weight Bearing Restrictions: No      Mobility  Bed Mobility               General bed mobility comments: Pt received walking out of the bathroom.   Transfers Overall transfer level: Needs assistance Equipment used: None Transfers: Sit to/from Stand Sit to Stand: Supervision         General transfer comment: VC's for maintanence of back precautions. No LOB noted.   Ambulation/Gait Ambulation/Gait assistance: Supervision Ambulation Distance (Feet): 400 Feet Assistive device: None Gait Pattern/deviations: Step-through  pattern;Decreased stride length;Decreased weight shift to right Gait velocity: Decreased Gait velocity interpretation: Below normal speed for age/gender General Gait Details: VC's for improved posture and to maintain back precautions. Pt was able to ambulate without LOB, however antalgic gait pattern noted due to limp on RLE.   Stairs            Wheelchair Mobility    Modified Rankin (Stroke Patients Only)       Balance Overall balance assessment: No apparent balance deficits (not formally assessed)                                           Pertinent Vitals/Pain Pain Assessment: 0-10 Pain Score: 2  Pain Location: Incisional pain Pain Descriptors / Indicators: Operative site guarding Pain Intervention(s): Limited activity within patient's tolerance;Monitored during session;Repositioned    Home Living Family/patient expects to be discharged to:: Private residence Living Arrangements: Spouse/significant other Available Help at Discharge: Family Type of Home: House Home Access: Stairs to enter Entrance Stairs-Rails: Psychiatric nurse of Steps: 2 Home Layout: Two level Home Equipment: Cane - single point      Prior Function Level of Independence: Independent         Comments: Pt was independent prior to injury which was ~5 weeks ago. Since then pt has been using a cane for balance and energy conservation. States he was trying to walk for exercise until his surgery.      Hand Dominance        Extremity/Trunk Assessment  Upper Extremity Assessment: Defer to OT evaluation           Lower Extremity Assessment: Overall WFL for tasks assessed;RLE deficits/detail RLE Deficits / Details: Decreased strength consistent with >1 month of radiating pain from low back.     Cervical / Trunk Assessment: Normal  Communication   Communication: No difficulties  Cognition Arousal/Alertness: Awake/alert Behavior During Therapy: WFL for  tasks assessed/performed Overall Cognitive Status: Within Functional Limits for tasks assessed       Memory: Decreased recall of precautions              General Comments General comments (skin integrity, edema, etc.): Pt was educated on activity modification to maintain back precautions. Will need reinforcement of precautions during ADL's from OT.    Exercises        Assessment/Plan    PT Assessment Patient needs continued PT services  PT Diagnosis Abnormality of gait   PT Problem List Decreased strength;Decreased range of motion;Decreased activity tolerance;Decreased balance;Decreased mobility;Decreased knowledge of use of DME;Decreased safety awareness;Decreased knowledge of precautions  PT Treatment Interventions DME instruction;Gait training;Stair training;Functional mobility training;Therapeutic activities;Therapeutic exercise;Neuromuscular re-education;Patient/family education   PT Goals (Current goals can be found in the Care Plan section) Acute Rehab PT Goals Patient Stated Goal: Home today PT Goal Formulation: With patient Time For Goal Achievement: 09/12/14 Potential to Achieve Goals: Good    Frequency Min 5X/week   Barriers to discharge        Co-evaluation               End of Session   Activity Tolerance: Patient tolerated treatment well Patient left: in chair;with call bell/phone within reach Nurse Communication: Mobility status         Time: 0820-0839 PT Time Calculation (min) (ACUTE ONLY): 19 min   Charges:   PT Evaluation $Initial PT Evaluation Tier I: 1 Procedure     PT G CodesRolinda Roan Sep 10, 2014, 8:54 AM   Rolinda Roan, PT, DPT Acute Rehabilitation Services Pager: [phone]

## 2014-09-07 NOTE — Progress Notes (Signed)
    Subjective: Procedure(s) (LRB): LUMBER DECOMPRESSION L3-5 (N/A) 1 Day Post-Op  Patient reports pain as 2 on 0-10 scale.  Reports decreased leg pain reports incisional back pain   Positive void Negative bowel movement Positive flatus Negative chest pain or shortness of breath  Objective: Vital signs in last 24 hours: Temp:  [97.6 F (36.4 C)-98.7 F (37.1 C)] 98.6 F (37 C) (05/20 0400) Pulse Rate:  [55-76] 71 (05/20 0400) Resp:  [10-20] 20 (05/20 0400) BP: (101-158)/(55-91) 120/55 mmHg (05/20 0400) SpO2:  [95 %-100 %] 95 % (05/20 0400) Weight:  [100.562 kg (221 lb 11.2 oz)] 100.562 kg (221 lb 11.2 oz) (05/19 1122)  Intake/Output from previous day: 05/19 0701 - 05/20 0700 In: 1600 [I.V.:1600] Out: 50 [Blood:50]  Labs:  Recent Labs  09/04/14 1059  WBC 5.2  RBC 4.89  HCT 42.6  PLT 199    Recent Labs  09/04/14 1059  NA 138  K 4.2  CL 101  CO2 30  BUN 14  CREATININE 0.74  GLUCOSE 99  CALCIUM 9.5   No results for input(s): LABPT, INR in the last 72 hours.  Physical Exam: Neurologically intact ABD soft Intact pulses distally Dorsiflexion/Plantar flexion intact Incision: dressing C/D/I and no drainage Compartment soft  Assessment/Plan: Patient stable  xrays n/a Continue mobilization with physical therapy Continue care  Up with therapy  Doing very well Radicular leg pain improving Ok for d/c to home  Melina Schools, MD [place] [phone]

## 2014-09-07 NOTE — Progress Notes (Signed)
Occupational Therapy Evaluation Patient Details Name: QUINT CHESTNUT MRN: [id number] DOB: 1945-08-28 Today's Date: 09/07/2014    History of Present Illness Pt is a 69 y/o male admitted s/p L3-L5 posterior decompression.    Clinical Impression   All education completed regarding back precautions during ADLs. Patient has discharge orders to go home today. No further OT needs.    Follow Up Recommendations  No OT follow up;Supervision/Assistance PRN    Equipment Recommendations  None recommended by OT    Recommendations for Other Services       Precautions / Restrictions Precautions Precautions: Fall;Back Required Braces or Orthoses: Spinal Brace Spinal Brace: Applied in sitting position;Lumbar corset;Other (comment) (can remove for shower/bathing) Restrictions Weight Bearing Restrictions: No      Mobility Bed Mobility               General bed mobility comments: Patient up in chair on arrival.  Transfers Overall transfer level: Needs assistance Equipment used: None Transfers: Sit to/from Stand Sit to Stand: Supervision             Balance                                            ADL Overall ADL's : Needs assistance/impaired Eating/Feeding: Independent   Grooming: Set up;Supervision/safety;Standing;Sitting   Upper Body Bathing: Set up;Supervision/ safety;Sitting   Lower Body Bathing: Minimal assistance;Sit to/from stand   Upper Body Dressing : Set up;Supervision/safety   Lower Body Dressing: Moderate assistance;Sit to/from stand;Cueing for back precautions   Toilet Transfer: Supervision/safety;Comfort height toilet;Grab bars   Toileting- Clothing Manipulation and Hygiene: Minimal assistance;Sit to/from stand   Tub/ Shower Transfer: Supervision/safety;Adhering to back precautions;Ambulation   Functional mobility during ADLs: Supervision/safety;Cane General ADL Comments: Patient recalled 3/3 back precautions. Educated  patient on how precautions affect ADL performance. Patient reports he has handicapped height toilets throughout his home and a grab bar the toilet he uses most often. He has a walk-in shower. He has AE from prior open heart surgery and denies need to review how to use the equipment for LB self-care. Educated patient to use AE for LB self-care vs having wife assist him so he maintains precautions.All education completed and no further OT needs.     Vision     Perception     Praxis      Pertinent Vitals/Pain Pain Assessment: No/denies pain       Hand Dominance     Extremity/Trunk Assessment Upper Extremity Assessment Upper Extremity Assessment: Overall WFL for tasks assessed   Lower Extremity Assessment Lower Extremity Assessment: Defer to PT evaluation     Cervical / Trunk Assessment Cervical / Trunk Assessment: Normal   Communication Communication Communication: No difficulties   Cognition Arousal/Alertness: Awake/alert Behavior During Therapy: WFL for tasks assessed/performed Overall Cognitive Status: Within Functional Limits for tasks assessed                    General Comments       Exercises       Shoulder Instructions      Home Living Family/patient expects to be discharged to:: Private residence Living Arrangements: Spouse/significant other Available Help at Discharge: Family Type of Home: House Home Access: Stairs to enter Technical brewer of Steps: 2 Entrance Stairs-Rails: Right;Left Home Layout: Two level Alternate Level Stairs-Number of Steps: Full flight Alternate Level Stairs-Rails: Right;Left;Can reach  both Bathroom Shower/Tub: Occupational psychologist: Handicapped height     Home Equipment: [place] - single point;Grab bars - toilet;Adaptive equipment Adaptive Equipment: Reacher;Sock aid;Long-handled shoe horn        Prior Functioning/Environment Level of Independence: Independent        Comments: Pt was  independent prior to injury which was ~5 weeks ago. Since then pt has been using a cane for balance and energy conservation. States he was trying to walk for exercise until his surgery.     OT Diagnosis: Acute pain   OT Problem List: Decreased knowledge of precautions;Pain   OT Treatment/Interventions:      OT Goals(Current goals can be found in the care plan section) Acute Rehab OT Goals Patient Stated Goal: Home today OT Goal Formulation: All assessment and education complete, DC therapy  OT Frequency:     Barriers to D/C:            Co-evaluation              End of Session Equipment Utilized During Treatment: Back brace Nurse Communication: Other (comment) (no equipment needs for home)  Activity Tolerance: Patient tolerated treatment well Patient left: in chair;with call bell/phone within reach;with family/visitor present   Time: 6440-3474 OT Time Calculation (min): 16 min Charges:  OT General Charges $OT Visit: 1 Procedure OT Evaluation $Initial OT Evaluation Tier I: 1 Procedure G-Codes:    Travoris Bushey A 09/16/14, 10:21 AM

## 2014-09-08 NOTE — Anesthesia Postprocedure Evaluation (Signed)
  Anesthesia Post-op Note  Patient: [name]  Procedure(s) Performed: Procedure(s): LUMBER DECOMPRESSION L3-5 (N/A)  Patient Location: PACU  Anesthesia Type:General  Level of Consciousness: awake  Airway and Oxygen Therapy: Patient Spontanous Breathing  Post-op Pain: mild  Post-op Assessment: Post-op Vital signs reviewed  Post-op Vital Signs: Reviewed  Last Vitals:  Filed Vitals:   09/07/14 0748  BP: 138/63  Pulse: 67  Temp: 36.8 C  Resp: 18    Complications: No apparent anesthesia complications

## 2014-09-10 ENCOUNTER — Encounter (HOSPITAL_COMMUNITY): Payer: Self-pay | Admitting: Orthopedic Surgery

## 2014-09-21 DIAGNOSIS — Z4789 Encounter for other orthopedic aftercare: Secondary | ICD-10-CM | POA: Diagnosis not present

## 2014-10-15 ENCOUNTER — Other Ambulatory Visit: Payer: Self-pay

## 2014-10-19 DIAGNOSIS — Z4789 Encounter for other orthopedic aftercare: Secondary | ICD-10-CM | POA: Diagnosis not present

## 2014-10-26 DIAGNOSIS — I10 Essential (primary) hypertension: Secondary | ICD-10-CM | POA: Diagnosis not present

## 2014-10-26 DIAGNOSIS — Z951 Presence of aortocoronary bypass graft: Secondary | ICD-10-CM | POA: Diagnosis not present

## 2014-10-26 DIAGNOSIS — I2510 Atherosclerotic heart disease of native coronary artery without angina pectoris: Secondary | ICD-10-CM | POA: Diagnosis not present

## 2014-10-26 DIAGNOSIS — E1165 Type 2 diabetes mellitus with hyperglycemia: Secondary | ICD-10-CM | POA: Diagnosis not present

## 2014-11-01 DIAGNOSIS — M5136 Other intervertebral disc degeneration, lumbar region: Secondary | ICD-10-CM | POA: Diagnosis not present

## 2014-11-16 DIAGNOSIS — M5136 Other intervertebral disc degeneration, lumbar region: Secondary | ICD-10-CM | POA: Diagnosis not present

## 2014-11-30 DIAGNOSIS — Z4789 Encounter for other orthopedic aftercare: Secondary | ICD-10-CM | POA: Diagnosis not present

## 2014-12-28 DIAGNOSIS — C61 Malignant neoplasm of prostate: Secondary | ICD-10-CM | POA: Diagnosis not present

## 2015-01-04 DIAGNOSIS — C61 Malignant neoplasm of prostate: Secondary | ICD-10-CM | POA: Diagnosis not present

## 2015-03-04 DIAGNOSIS — Z79899 Other long term (current) drug therapy: Secondary | ICD-10-CM | POA: Diagnosis not present

## 2015-03-04 DIAGNOSIS — I10 Essential (primary) hypertension: Secondary | ICD-10-CM | POA: Diagnosis not present

## 2015-03-04 DIAGNOSIS — Z7901 Long term (current) use of anticoagulants: Secondary | ICD-10-CM | POA: Diagnosis not present

## 2015-03-04 DIAGNOSIS — M5136 Other intervertebral disc degeneration, lumbar region: Secondary | ICD-10-CM | POA: Diagnosis not present

## 2015-03-04 DIAGNOSIS — I2510 Atherosclerotic heart disease of native coronary artery without angina pectoris: Secondary | ICD-10-CM | POA: Diagnosis not present

## 2015-03-04 DIAGNOSIS — L989 Disorder of the skin and subcutaneous tissue, unspecified: Secondary | ICD-10-CM | POA: Diagnosis not present

## 2015-03-04 DIAGNOSIS — E119 Type 2 diabetes mellitus without complications: Secondary | ICD-10-CM | POA: Diagnosis not present

## 2015-03-04 DIAGNOSIS — Z4789 Encounter for other orthopedic aftercare: Secondary | ICD-10-CM | POA: Diagnosis not present

## 2015-03-04 DIAGNOSIS — Z23 Encounter for immunization: Secondary | ICD-10-CM | POA: Diagnosis not present

## 2015-03-06 DIAGNOSIS — I2510 Atherosclerotic heart disease of native coronary artery without angina pectoris: Secondary | ICD-10-CM | POA: Diagnosis not present

## 2015-03-06 DIAGNOSIS — Z79899 Other long term (current) drug therapy: Secondary | ICD-10-CM | POA: Diagnosis not present

## 2015-03-06 DIAGNOSIS — E119 Type 2 diabetes mellitus without complications: Secondary | ICD-10-CM | POA: Diagnosis not present

## 2015-03-06 DIAGNOSIS — I10 Essential (primary) hypertension: Secondary | ICD-10-CM | POA: Diagnosis not present

## 2015-03-06 DIAGNOSIS — Z7901 Long term (current) use of anticoagulants: Secondary | ICD-10-CM | POA: Diagnosis not present

## 2015-04-21 DIAGNOSIS — I639 Cerebral infarction, unspecified: Secondary | ICD-10-CM

## 2015-04-21 HISTORY — DX: Cerebral infarction, unspecified: I63.9

## 2015-04-23 ENCOUNTER — Encounter: Payer: Self-pay | Admitting: Gastroenterology

## 2015-04-23 DIAGNOSIS — I6523 Occlusion and stenosis of bilateral carotid arteries: Secondary | ICD-10-CM | POA: Diagnosis not present

## 2015-05-01 DIAGNOSIS — E1165 Type 2 diabetes mellitus with hyperglycemia: Secondary | ICD-10-CM | POA: Diagnosis not present

## 2015-05-01 DIAGNOSIS — Z951 Presence of aortocoronary bypass graft: Secondary | ICD-10-CM | POA: Diagnosis not present

## 2015-05-01 DIAGNOSIS — I2510 Atherosclerotic heart disease of native coronary artery without angina pectoris: Secondary | ICD-10-CM | POA: Diagnosis not present

## 2015-05-01 DIAGNOSIS — I10 Essential (primary) hypertension: Secondary | ICD-10-CM | POA: Diagnosis not present

## 2015-05-02 DIAGNOSIS — I739 Peripheral vascular disease, unspecified: Secondary | ICD-10-CM | POA: Diagnosis not present

## 2015-05-08 DIAGNOSIS — D485 Neoplasm of uncertain behavior of skin: Secondary | ICD-10-CM | POA: Diagnosis not present

## 2015-05-08 DIAGNOSIS — D044 Carcinoma in situ of skin of scalp and neck: Secondary | ICD-10-CM | POA: Diagnosis not present

## 2015-05-28 DIAGNOSIS — C4442 Squamous cell carcinoma of skin of scalp and neck: Secondary | ICD-10-CM | POA: Diagnosis not present

## 2015-06-18 ENCOUNTER — Encounter (HOSPITAL_COMMUNITY): Payer: Self-pay | Admitting: Cardiology

## 2015-06-18 ENCOUNTER — Emergency Department (HOSPITAL_COMMUNITY): Payer: Medicare Other

## 2015-06-18 ENCOUNTER — Inpatient Hospital Stay (HOSPITAL_COMMUNITY)
Admission: EM | Admit: 2015-06-18 | Discharge: 2015-06-22 | DRG: 066 | Disposition: A | Discharge status: Home / Self Care | Payer: Medicare Other | Attending: Family Medicine | Admitting: Family Medicine

## 2015-06-18 DIAGNOSIS — I2510 Atherosclerotic heart disease of native coronary artery without angina pectoris: Secondary | ICD-10-CM

## 2015-06-18 DIAGNOSIS — E1140 Type 2 diabetes mellitus with diabetic neuropathy, unspecified: Secondary | ICD-10-CM | POA: Diagnosis not present

## 2015-06-18 DIAGNOSIS — R531 Weakness: Secondary | ICD-10-CM | POA: Diagnosis not present

## 2015-06-18 DIAGNOSIS — G451 Carotid artery syndrome (hemispheric): Secondary | ICD-10-CM | POA: Diagnosis not present

## 2015-06-18 DIAGNOSIS — Z87891 Personal history of nicotine dependence: Secondary | ICD-10-CM

## 2015-06-18 DIAGNOSIS — Z6829 Body mass index (BMI) 29.0-29.9, adult: Secondary | ICD-10-CM

## 2015-06-18 DIAGNOSIS — I6521 Occlusion and stenosis of right carotid artery: Secondary | ICD-10-CM | POA: Insufficient documentation

## 2015-06-18 DIAGNOSIS — E7800 Pure hypercholesterolemia, unspecified: Secondary | ICD-10-CM | POA: Diagnosis present

## 2015-06-18 DIAGNOSIS — R29700 NIHSS score 0: Secondary | ICD-10-CM | POA: Diagnosis present

## 2015-06-18 DIAGNOSIS — F40240 Claustrophobia: Secondary | ICD-10-CM | POA: Diagnosis present

## 2015-06-18 DIAGNOSIS — Z955 Presence of coronary angioplasty implant and graft: Secondary | ICD-10-CM

## 2015-06-18 DIAGNOSIS — E785 Hyperlipidemia, unspecified: Secondary | ICD-10-CM

## 2015-06-18 DIAGNOSIS — G8324 Monoplegia of upper limb affecting left nondominant side: Secondary | ICD-10-CM | POA: Diagnosis present

## 2015-06-18 DIAGNOSIS — R200 Anesthesia of skin: Secondary | ICD-10-CM | POA: Diagnosis not present

## 2015-06-18 DIAGNOSIS — I10 Essential (primary) hypertension: Secondary | ICD-10-CM | POA: Diagnosis not present

## 2015-06-18 DIAGNOSIS — I454 Nonspecific intraventricular block: Secondary | ICD-10-CM | POA: Diagnosis not present

## 2015-06-18 DIAGNOSIS — G459 Transient cerebral ischemic attack, unspecified: Secondary | ICD-10-CM | POA: Diagnosis not present

## 2015-06-18 DIAGNOSIS — E119 Type 2 diabetes mellitus without complications: Secondary | ICD-10-CM

## 2015-06-18 DIAGNOSIS — F17200 Nicotine dependence, unspecified, uncomplicated: Secondary | ICD-10-CM | POA: Diagnosis present

## 2015-06-18 DIAGNOSIS — Z7984 Long term (current) use of oral hypoglycemic drugs: Secondary | ICD-10-CM

## 2015-06-18 DIAGNOSIS — I639 Cerebral infarction, unspecified: Principal | ICD-10-CM | POA: Diagnosis present

## 2015-06-18 DIAGNOSIS — Z7982 Long term (current) use of aspirin: Secondary | ICD-10-CM

## 2015-06-18 DIAGNOSIS — Z951 Presence of aortocoronary bypass graft: Secondary | ICD-10-CM

## 2015-06-18 DIAGNOSIS — I1A0 Resistant hypertension: Secondary | ICD-10-CM | POA: Diagnosis present

## 2015-06-18 DIAGNOSIS — I6523 Occlusion and stenosis of bilateral carotid arteries: Secondary | ICD-10-CM | POA: Diagnosis present

## 2015-06-18 DIAGNOSIS — I63231 Cerebral infarction due to unspecified occlusion or stenosis of right carotid arteries: Secondary | ICD-10-CM | POA: Diagnosis present

## 2015-06-18 LAB — DIFFERENTIAL
Basophils Absolute: 0 10*3/uL (ref 0.0–0.1)
Basophils Relative: 1 %
Eosinophils Absolute: 0.1 10*3/uL (ref 0.0–0.7)
Eosinophils Relative: 2 %
Lymphocytes Relative: 28 %
Lymphs Abs: 1.3 10*3/uL (ref 0.7–4.0)
Monocytes Absolute: 0.4 10*3/uL (ref 0.1–1.0)
Monocytes Relative: 8 %
Neutro Abs: 2.9 10*3/uL (ref 1.7–7.7)
Neutrophils Relative %: 61 %

## 2015-06-18 LAB — I-STAT CHEM 8, ED
BUN: 26 mg/dL — ABNORMAL HIGH (ref 6–20)
BUN: 31 mg/dL — ABNORMAL HIGH (ref 6–20)
Calcium, Ion: 1.19 mmol/L (ref 1.13–1.30)
Calcium, Ion: 1.13 mmol/L (ref 1.13–1.30)
Chloride: 101 mmol/L (ref 101–111)
Chloride: 102 mmol/L (ref 101–111)
Creatinine, Ser: 1 mg/dL (ref 0.61–1.24)
Creatinine, Ser: 0.8 mg/dL (ref 0.61–1.24)
Glucose, Bld: 100 mg/dL — ABNORMAL HIGH (ref 65–99)
Glucose, Bld: 85 mg/dL (ref 65–99)
HCT: 45 % (ref 39.0–52.0)
HCT: 44 % (ref 39.0–52.0)
Hemoglobin: 15.3 g/dL (ref 13.0–17.0)
Hemoglobin: 15 g/dL (ref 13.0–17.0)
Potassium: 4.3 mmol/L (ref 3.5–5.1)
Potassium: 5.5 mmol/L — ABNORMAL HIGH (ref 3.5–5.1)
Sodium: 141 mmol/L (ref 135–145)
Sodium: 138 mmol/L (ref 135–145)
TCO2: 27 mmol/L (ref 0–100)
TCO2: 30 mmol/L (ref 0–100)

## 2015-06-18 LAB — URINALYSIS, ROUTINE W REFLEX MICROSCOPIC
Bilirubin Urine: NEGATIVE
Glucose, UA: NEGATIVE mg/dL
Hgb urine dipstick: NEGATIVE
Ketones, ur: NEGATIVE mg/dL
Leukocytes, UA: NEGATIVE
Nitrite: NEGATIVE
Protein, ur: NEGATIVE mg/dL
Specific Gravity, Urine: 1.019 (ref 1.005–1.030)
pH: 5.5 (ref 5.0–8.0)

## 2015-06-18 LAB — CBC
HCT: 41.7 % (ref 39.0–52.0)
Hemoglobin: 13.6 g/dL (ref 13.0–17.0)
MCH: 28.3 pg (ref 26.0–34.0)
MCHC: 32.6 g/dL (ref 30.0–36.0)
MCV: 86.7 fL (ref 78.0–100.0)
Platelets: 195 10*3/uL (ref 150–400)
RBC: 4.81 MIL/uL (ref 4.22–5.81)
RDW: 13.1 % (ref 11.5–15.5)
WBC: 4.8 10*3/uL (ref 4.0–10.5)

## 2015-06-18 LAB — COMPREHENSIVE METABOLIC PANEL
ALT: 15 U/L — ABNORMAL LOW (ref 17–63)
AST: 21 U/L (ref 15–41)
Albumin: 4.2 g/dL (ref 3.5–5.0)
Alkaline Phosphatase: 66 U/L (ref 38–126)
Anion gap: 9 (ref 5–15)
BUN: 22 mg/dL — ABNORMAL HIGH (ref 6–20)
CO2: 26 mmol/L (ref 22–32)
Calcium: 9.4 mg/dL (ref 8.9–10.3)
Chloride: 105 mmol/L (ref 101–111)
Creatinine, Ser: 1.04 mg/dL (ref 0.61–1.24)
GFR calc Af Amer: >60 mL/min (ref >60)
GFR calc non Af Amer: >60 mL/min (ref >60)
Glucose, Bld: 105 mg/dL — ABNORMAL HIGH (ref 65–99)
Potassium: 4.4 mmol/L (ref 3.5–5.1)
Sodium: 140 mmol/L (ref 135–145)
Total Bilirubin: 0.9 mg/dL (ref 0.3–1.2)
Total Protein: 7 g/dL (ref 6.5–8.1)

## 2015-06-18 LAB — GLUCOSE, CAPILLARY: Glucose-Capillary: 186 mg/dL — ABNORMAL HIGH (ref 65–99)

## 2015-06-18 LAB — RAPID URINE DRUG SCREEN, HOSP PERFORMED
Amphetamines: NOT DETECTED
Barbiturates: NOT DETECTED
Benzodiazepines: NOT DETECTED
Cocaine: NOT DETECTED
Opiates: NOT DETECTED
Tetrahydrocannabinol: NOT DETECTED

## 2015-06-18 LAB — ETHANOL: Alcohol, Ethyl (B): <5 mg/dL (ref <5)

## 2015-06-18 LAB — I-STAT TROPONIN, ED
Troponin i, poc: 0 ng/mL (ref 0.00–0.08)
Troponin i, poc: 0 ng/mL (ref 0.00–0.08)

## 2015-06-18 LAB — PROTIME-INR
INR: 1.06 (ref 0.00–1.49)
Prothrombin Time: 14 seconds (ref 11.6–15.2)

## 2015-06-18 LAB — APTT: aPTT: 31 seconds (ref 24–37)

## 2015-06-18 MED ORDER — ONDANSETRON HCL 4 MG/2ML IJ SOLN: 4.0000 mg | Freq: Four times a day (QID) | INTRAMUSCULAR | Status: DC | PRN

## 2015-06-18 MED ORDER — STROKE: EARLY STAGES OF RECOVERY BOOK
Freq: Once | Status: AC
  Administered 2015-06-19: 01:00:00
  Filled 2015-06-18: qty 1

## 2015-06-18 MED ORDER — INSULIN ASPART 100 UNIT/ML ~~LOC~~ SOLN: 0.0000 [IU] | Freq: Three times a day (TID) | SUBCUTANEOUS | Status: DC

## 2015-06-18 MED ORDER — AMLODIPINE BESYLATE 10 MG PO TABS
10.0000 mg | ORAL_TABLET | Freq: Every day | ORAL | Status: DC
  Administered 2015-06-19: 10 mg via ORAL
  Filled 2015-06-18: qty 1

## 2015-06-18 MED ORDER — ONDANSETRON HCL 4 MG PO TABS: 4.0000 mg | ORAL_TABLET | Freq: Four times a day (QID) | ORAL | Status: DC | PRN

## 2015-06-18 MED ORDER — ASPIRIN 325 MG PO TABS
325.0000 mg | ORAL_TABLET | Freq: Every day | ORAL | Status: DC
  Administered 2015-06-19: 325 mg via ORAL
  Filled 2015-06-18: qty 1

## 2015-06-18 MED ORDER — SODIUM CHLORIDE 0.9 % IV SOLN: 250.0000 mL | INTRAVENOUS | Status: DC | PRN

## 2015-06-18 MED ORDER — ASPIRIN 300 MG RE SUPP: 300.0000 mg | Freq: Every day | RECTAL | Status: DC

## 2015-06-18 MED ORDER — ATORVASTATIN CALCIUM 10 MG PO TABS
20.0000 mg | ORAL_TABLET | Freq: Every day | ORAL | Status: DC
  Administered 2015-06-19 – 2015-06-22 (×4): 20 mg via ORAL
  Filled 2015-06-18 (×4): qty 2

## 2015-06-18 MED ORDER — SODIUM CHLORIDE 0.9% FLUSH: 3.0000 mL | INTRAVENOUS | Status: DC | PRN

## 2015-06-18 MED ORDER — INSULIN ASPART 100 UNIT/ML ~~LOC~~ SOLN: 0.0000 [IU] | Freq: Every day | SUBCUTANEOUS | Status: DC

## 2015-06-18 MED ORDER — SODIUM CHLORIDE 0.9% FLUSH
3.0000 mL | Freq: Two times a day (BID) | INTRAVENOUS | Status: DC
  Administered 2015-06-19: 3 mL via INTRAVENOUS
  Administered 2015-06-19: 10 mL via INTRAVENOUS
  Administered 2015-06-20 – 2015-06-22 (×5): 3 mL via INTRAVENOUS

## 2015-06-18 MED ORDER — ENOXAPARIN SODIUM 40 MG/0.4ML ~~LOC~~ SOLN
40.0000 mg | SUBCUTANEOUS | Status: DC
  Administered 2015-06-19 – 2015-06-22 (×4): 40 mg via SUBCUTANEOUS
  Filled 2015-06-18 (×4): qty 0.4

## 2015-06-18 MED ORDER — ACETAMINOPHEN 650 MG RE SUPP
650.0000 mg | Freq: Four times a day (QID) | RECTAL | Status: DC | PRN
Start: 2015-06-18 — End: 2015-06-22

## 2015-06-18 MED ORDER — ACETAMINOPHEN 325 MG PO TABS: 650.0000 mg | ORAL_TABLET | Freq: Four times a day (QID) | ORAL | Status: DC | PRN

## 2015-06-18 MED ORDER — SENNOSIDES-DOCUSATE SODIUM 8.6-50 MG PO TABS: 1.0000 | ORAL_TABLET | Freq: Every evening | ORAL | Status: DC | PRN

## 2015-06-18 MED ORDER — LORAZEPAM 2 MG/ML IJ SOLN
0.5000 mg | Freq: Once | INTRAMUSCULAR | Status: AC
  Administered 2015-06-19: 0.5 mg via INTRAVENOUS
  Filled 2015-06-18: qty 1

## 2015-06-18 MED ORDER — METOPROLOL TARTRATE 25 MG PO TABS
25.0000 mg | ORAL_TABLET | Freq: Every day | ORAL | Status: DC
  Administered 2015-06-19: 25 mg via ORAL
  Filled 2015-06-18: qty 1

## 2015-06-18 MED ORDER — OXYCODONE HCL 5 MG PO TABS: 5.0000 mg | ORAL_TABLET | ORAL | Status: DC | PRN

## 2015-06-18 MED ORDER — LOSARTAN POTASSIUM 50 MG PO TABS
100.0000 mg | ORAL_TABLET | Freq: Every day | ORAL | Status: DC
  Administered 2015-06-19: 100 mg via ORAL
  Filled 2015-06-18: qty 2

## 2015-06-18 MED ORDER — ALUM & MAG HYDROXIDE-SIMETH 200-200-20 MG/5ML PO SUSP: 30.0000 mL | Freq: Four times a day (QID) | ORAL | Status: DC | PRN

## 2015-06-18 MED ORDER — HYDROMORPHONE HCL 1 MG/ML IJ SOLN: 0.5000 mg | INTRAMUSCULAR | Status: DC | PRN

## 2015-06-18 MED ORDER — ONDANSETRON HCL 4 MG/2ML IJ SOLN: 4.0000 mg | Freq: Three times a day (TID) | INTRAMUSCULAR | Status: DC | PRN

## 2015-06-18 MED ORDER — ASPIRIN EC 81 MG PO TBEC: 81.0000 mg | DELAYED_RELEASE_TABLET | Freq: Every day | ORAL | Status: DC

## 2015-06-18 NOTE — ED Notes (Signed)
Patient brought back to room with family in tow; patient getting undressed and into a gown at this time

## 2015-06-18 NOTE — H&P (Signed)
[facility] Admission History and Physical       KOHLER AGERTON [id number] DOB: 04-26-1945 DOA: 06/18/2015  Referring physician: EDP PCP: Antony Blackbird, MD  Specialists:   Chief Complaint: Left Arm Numbness  HPI: [name] is a 70 y.o. male with a history of ASCVD, HTN, DM2, Hyperlipidemia who presents to the ED at the behest of his wife due to 2 episodes of Left arm numbness and weakness, the first happened last night, and a second episode occurred this afternoon.   The episode today was worse and lasted for 45 minutes to an hour.  He reports also having a numbness in the left side of his neck and face.   He denies having a headache, or chest pain, or visual changes.   A CT scan of the head was performed and was negative for acute changes.  Neurology was consulted, and he was referred for admission and further workup.      Review of Systems:  Constitutional: No Weight Loss, No Weight Gain, Night Sweats, Fevers, Chills, Dizziness, Light Headedness, Fatigue, or Generalized Weakness HEENT: No Headaches, Difficulty Swallowing,Tooth/Dental Problems,Sore Throat,  No Sneezing, Rhinitis, Ear Ache, Nasal Congestion, or Post Nasal Drip,  Cardio-vascular:  No Chest pain, Orthopnea, PND, Edema in Lower Extremities, Anasarca, Dizziness, Palpitations  Resp: No Dyspnea, No DOE, No Productive Cough, No Non-Productive Cough, No Hemoptysis, No Wheezing.    GI: No Heartburn, Indigestion, Abdominal Pain, Nausea, Vomiting, Diarrhea, Constipation, Hematemesis, Hematochezia, Melena, Change in Bowel Habits,  Loss of Appetite  GU: No Dysuria, No Change in Color of Urine, No Urgency or Urinary Frequency, No Flank pain.  Musculoskeletal: No Joint Pain or Swelling, No Decreased Range of Motion, No Back Pain.  Neurologic: No Syncope, No Seizures, Muscle Weakness, Paresthesia, Vision Disturbance or Loss, No Diplopia, No Vertigo, No Difficulty Walking,  Skin: No Rash or Lesions. Psych: No Change  in Mood or Affect, No Depression or Anxiety, No Memory loss, No Confusion, or Hallucinations   Past Medical History  Diagnosis Date  . ASCVD (arteriosclerotic cardiovascular disease)     03/2004: DES x2 to the RCA; IMI with stent occlusion in 02/2005- suboptimal Plavix compliance  . Hypertension   . Tobacco abuse   . Hyperlipidemia   . Morbid obesity ([place])   . Mild depression   . Carcinoma of prostate ([place])     Clopidogrel held for biopsy  . CAD (coronary artery disease)   . Diabetes mellitus without complication ([place])   . Constipation due to pain medication   . Diabetic neuropathy [facility])      Past Surgical History  Procedure Laterality Date  . Lumbar spine surgery  2002  . Vasectomy    . Prostatectomy  02/2007  . 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 Cardiac catheterization      X 1 stent before having CABG  . Lumbar laminectomy/decompression microdiscectomy N/A 09/06/2014    Procedure: LUMBER DECOMPRESSION L3-5;  Surgeon: Melina Schools, MD;  Location: [place];  Service: Orthopedics;  Laterality: N/A;      Prior to Admission medications   Medication Sig Start Date End Date Taking? Authorizing Provider  amLODipine (NORVASC) 10 MG tablet Take 10 mg by mouth daily.   Yes Historical Provider, MD  aspirin EC 81 MG tablet Take 81 mg by mouth daily.   Yes Historical Provider, MD  atorvastatin (LIPITOR) 20 MG tablet Take 20 mg by mouth daily.   Yes Historical Provider, MD  hydrochlorothiazide (  HYDRODIURIL) 25 MG tablet Take 25 mg by mouth daily.   Yes Historical Provider, MD  losartan (COZAAR) 100 MG tablet Take 100 mg by mouth daily.   Yes Historical Provider, MD  metFORMIN (GLUMETZA) 1000 MG (MOD) 24 hr tablet Take 1,000 mg by mouth daily with breakfast.   Yes Historical Provider, MD  metoprolol tartrate (LOPRESSOR) 25 MG tablet Take 25 mg by mouth daily.    Yes Historical Provider, MD     No Known Allergies  Social History:  reports that he has quit smoking. His  smoking use included Cigarettes. He quit smokeless tobacco use about 6 years ago. He reports that he drinks about 1.2 oz of alcohol per week. He reports that he does not use illicit drugs.    Family History  Problem Relation Age of Onset  . Kidney disease Mother   . Colon cancer Neg Hx        Physical Exam:  GEN:  Pleasant Well Nourished and Well Developed Elderly 70 y.o. African American male examined and in no acute distress; cooperative with exam Filed Vitals:   06/18/15 1830 06/18/15 1845 06/18/15 1900 06/18/15 1915  BP: 142/83 154/88 142/88 148/80  Pulse: 64     Temp:      TempSrc:      Resp: 19 18 21 18   Weight:      SpO2: 96%      Blood pressure 148/80, pulse 64, temperature 98 F (36.7 C), temperature source Oral, resp. rate 18, weight 100.245 kg (221 lb), SpO2 96 %. PSYCH: He is alert and oriented x4; does not appear anxious does not appear depressed; affect is normal HEENT: Normocephalic and Atraumatic, Mucous membranes pink; PERRLA; EOM intact; Fundi:  Benign;  No scleral icterus, Nares: Patent, Oropharynx: Clear, Fair Dentition,    Neck:  FROM, No Cervical Lymphadenopathy nor Thyromegaly or Carotid Bruit; No JVD; Breasts:: Not examined CHEST WALL: No tenderness CHEST: Normal respiration, clear to auscultation bilaterally HEART: Regular rate and rhythm; no murmurs rubs or gallops BACK: No kyphosis or scoliosis; No CVA tenderness ABDOMEN: Positive Bowel Sounds, Soft Non-Tender, No Rebound or Guarding; No Masses, No Organomegaly,  Rectal Exam: Not done EXTREMITIES: No Cyanosis, Clubbing, or Edema; No Ulcerations. Genitalia: not examined PULSES: 2+ and symmetric SKIN: Normal hydration no rash or ulceration CNS:  Alert and Oriented x 4, No Focal Deficits Mental Status:  Alert, Oriented, Thought Content Appropriate. Speech Fluent without evidence of Aphasia. Able to follow 3 step commands without difficulty.  In No obvious pain.   Cranial Nerves:  II: Discs flat  bilaterally; Visual fields Intact, Pupils equal and reactive.    III,IV, VI: Extra-ocular motions intact bilaterally    V,VII: smile symmetric, facial light touch sensation normal bilaterally    VIII: hearing intact bilaterally    IX,X: gag reflex present    XI: bilateral shoulder shrug    XII: midline tongue extension   Motor:  Right:  Upper extremity 5/5     Left:  Upper extremity 5/5     Right:  Lower extremity 5/5    Left:  Lower extremity 5/5     Tone and Bulk:  normal tone throughout; no atrophy noted   Sensory:  Pinprick and light touch intact throughout, bilaterally   Deep Tendon Reflexes: 2+ and symmetric throughout   Plantars/ Babinski:  Right: normal Left: normal    Cerebellar:  Finger to nose without difficulty.   Gait: deferred    Vascular: pulses palpable throughout  Labs on Admission:  Basic Metabolic Panel:  Recent Labs Lab 06/18/15 1546 06/18/15 1609 06/18/15 1817  NA 140 141 138  K 4.4 4.3 5.5*  CL 105 101 102  CO2 26  --   --   GLUCOSE 105* 100* 85  BUN 22* 26* 31*  CREATININE 1.04 1.00 0.80  CALCIUM 9.4  --   --    Liver Function Tests:  Recent Labs Lab 06/18/15 1546  AST 21  ALT 15*  ALKPHOS 66  BILITOT 0.9  PROT 7.0  ALBUMIN 4.2   No results for input(s): LIPASE, AMYLASE in the last 168 hours. No results for input(s): AMMONIA in the last 168 hours. CBC:  Recent Labs Lab 06/18/15 1546 06/18/15 1609 06/18/15 1817  WBC 4.8  --   --   NEUTROABS 2.9  --   --   HGB 13.6 15.3 15.0  HCT 41.7 45.0 44.0  MCV 86.7  --   --   PLT 195  --   --    Cardiac Enzymes: No results for input(s): CKTOTAL, CKMB, CKMBINDEX, TROPONINI in the last 168 hours.  BNP (last 3 results) No results for input(s): BNP in the last 8760 hours.  ProBNP (last 3 results) No results for input(s): PROBNP in the last 8760 hours.  CBG: No results for input(s): GLUCAP in the last 168 hours.  Radiological Exams on Admission: Ct Head Wo  Contrast  06/18/2015  CLINICAL DATA:  Acute onset left-sided weakness hypertension EXAM: CT HEAD WITHOUT CONTRAST TECHNIQUE: Contiguous axial images were obtained from the base of the skull through the vertex without intravenous contrast. COMPARISON:  None. FINDINGS: The ventricles are normal in size and configuration. There is no intracranial mass, hemorrhage, extra-axial fluid collection, or midline shift. Gray-white compartments appear normal. No acute infarct evident. Bony calvarium appears intact. The mastoid air cells are clear. No intraorbital lesions apparent. IMPRESSION: Study within normal limits. Electronically Signed   By: Lowella Grip III M.D.   On: 06/18/2015 16:26     EKG: Independently reviewed.    Assessment/Plan:   70 y.o. male with  Principal Problem:    1.    TIA (transient ischemic attack)    TIA Workup    Cardiac Monitoring    Neuro Checks    MRI/MRA of Brain,      Has had recent Carotid US and 2D ECHO    Check Fasting Lipids, and HbA1C    ASA Rx    Neuro Consulted   Active Problems:      2.    ASCVD (arteriosclerotic cardiovascular disease)    Stable     3.    Diabetes mellitus without complication ([place])    Not currently taking his Metformin Rx    SSI Coverage PRN    Check HbA1C      4.    Hypertension       Continue Metoprolol, Amlodipine, and Losartan Rx    Monitor BPs        5.    Hyperlipidemia    Continue Atorvastatin Rx      6.    TOBACCO ABUSE          7.    DVT Prophylaxis    Lovenox        Code Status:     FULL CODE   Family Communication:  Wife and Daughter at Bedside   Disposition Plan:    Observation Status        Time spent: 57 Minutes  Theressa Millard [facility] Pager [phone]   If [place] Please Contact the Day Rounding Team MD for [facility]  If 7PM-7AM, Please Contact Night-Floor Coverage  [email or web address] Password [facility] 06/18/2015, 8:42 PM     ADDENDUM:   Patient was seen and examined  on 06/18/2015

## 2015-06-18 NOTE — Consult Note (Signed)
Reason for Consult:  TIA. Referring Physician: Noemi Chapel, MD.    HPI: [name] is a 70 y.o.  Right handed male with CAD, HTN, Diabetes, HLD, former smoker presents for two discrete episodes of left arm weakness. Yesterday it lasted 15 mins. Today it lasted 45 mins. No numbness, slurred speech, vision changes. No neck pain. He is on aspirin 325mg  daily. Quit smoking 06/28/09. Feels fine now.   Past Medical History  Diagnosis Date  . ASCVD (arteriosclerotic cardiovascular disease)     03/2004: DES x2 to the RCA; IMI with stent occlusion in 02/2005- suboptimal Plavix compliance  . Hypertension   . Tobacco abuse   . Hyperlipidemia   . Morbid obesity ([place])   . Mild depression   . Carcinoma of prostate ([place])     Clopidogrel held for biopsy  . CAD (coronary artery disease)   . Diabetes mellitus without complication ([place])   . Constipation due to pain medication   . Diabetic neuropathy [facility])      Past Surgical History  Procedure Laterality Date  . Lumbar spine surgery  2002  . Vasectomy    . Prostatectomy  02/2007  . 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 Cardiac catheterization      X 1 stent before having CABG  . Lumbar laminectomy/decompression microdiscectomy N/A 09/06/2014    Procedure: LUMBER DECOMPRESSION L3-5;  Surgeon: Melina Schools, MD;  Location: [place];  Service: Orthopedics;  Laterality: N/A;     Family History  Problem Relation Age of Onset  . Kidney disease Mother   . Colon cancer Neg Hx     Social History   Social History  . Marital Status: Married    Spouse Name: N/A  . Number of Children: N/A  . Years of Education: N/A   Occupational History  . Hospital director    Social History Main Topics  . Smoking status: Former Smoker    Types: Cigarettes  . Smokeless tobacco: Former Systems developer    Quit date: 06/28/2008  . Alcohol Use: 1.2 oz/week    2 Glasses of wine per week     Comment: Occasionally  . Drug Use: No  . Sexual Activity: Not  on file   Other Topics Concern  . Not on file   Social History Narrative   Married with 3 sons and 1 daughter   Daily caffeine use, 3 per day    No Known Allergies  No current facility-administered medications on file prior to encounter.   Current Outpatient Prescriptions on File Prior to Encounter  Medication Sig Dispense Refill  . amLODipine (NORVASC) 10 MG tablet Take 10 mg by mouth daily.    Marland Kitchen aspirin EC 81 MG tablet Take 81 mg by mouth daily.    Marland Kitchen atorvastatin (LIPITOR) 20 MG tablet Take 20 mg by mouth daily.    . hydrochlorothiazide (HYDRODIURIL) 25 MG tablet Take 25 mg by mouth daily.    Marland Kitchen losartan (COZAAR) 100 MG tablet Take 100 mg by mouth daily.    . metFORMIN (GLUMETZA) 1000 MG (MOD) 24 hr tablet Take 1,000 mg by mouth daily with breakfast.    . metoprolol tartrate (LOPRESSOR) 25 MG tablet Take 25 mg by mouth daily.           Physical Exam:  General: BP 148/80 mmHg  Pulse 64  Temp(Src) 98 F (36.7 C) (Oral)  Resp 18  Wt 100.245 kg (221 lb)  SpO2 96%  CV: RRR no  mumurs. Carotids no bruit bilaterally HEENT:  Fundoscopic exam reveals no papilledema. Normocephalic. Atraumatic.  Mental Status: Alert. Oriented. Fluent. Appropriate. Names. Repeats. Follows commands.  Cranial Nerves: PEARRL, EOMI, VFF, Facial sensation intact to temperature. No facial droop. Hearing intact bilaterally. Voice is not hoarse. Tongue protrudes midline. Motor: No drift of either arm or leg extended. Normal muscle bulk, power and tone. Sensory: Equal to light touch and vibration throughout.  Coord: Finger to nose intact. Heel to shin intact.     CT scan of brain performed and reviewed.   Assessment: 1. TIA   Recommendations:  1. Admit to telemetry monitored bed 2. TTE 3. Carotid U/S 4. MRI brain 5. MRA head 6. Lipid profile 7. DVT prophylaxis 8. Will follow

## 2015-06-18 NOTE — ED Notes (Signed)
Dr. Jenkins at bedside. 

## 2015-06-18 NOTE — ED Provider Notes (Signed)
CSN: [id number]     Arrival date & time 06/18/15  1502 History   First MD Initiated Contact with Patient 06/18/15 1746     Chief Complaint  Patient presents with  . Numbness     (Consider location/radiation/quality/duration/timing/severity/associated sxs/prior Treatment) HPI Comments: The patient is a 70 year old male, he does have a history of hypertension, cardiovascular disease status post bypass grafting as well as diabetes. He presents to the hospital today after having acute onset of left upper extremity numbness and weakness which occurred last night, it went away before he went to bed and was not present when he awoke this morning. He went to work today, at 2:00 PM he had acute onset of recurrent symptoms, by 3:15 PM they had totally resolved. At this time the patient has no symptoms, he feels as though his arm is back to normal, it is no longer heavy or numb. He denies having any lower extremity symptoms, denies visual changes, speech changes though his wife does state that his balance has been off recently.  The history is provided by the patient and the spouse.    Past Medical History  Diagnosis Date  . ASCVD (arteriosclerotic cardiovascular disease)     03/2004: DES x2 to the RCA; IMI with stent occlusion in 02/2005- suboptimal Plavix compliance  . Hypertension   . Tobacco abuse   . Hyperlipidemia   . Morbid obesity ([place])   . Mild depression   . Carcinoma of prostate ([place])     Clopidogrel held for biopsy  . CAD (coronary artery disease)   . Diabetes mellitus without complication ([place])   . Constipation due to pain medication   . Diabetic neuropathy [facility])    Past Surgical History  Procedure Laterality Date  . Lumbar spine surgery  2002  . Vasectomy    . Prostatectomy  02/2007  . 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 Cardiac catheterization      X 1 stent before having CABG  . Lumbar laminectomy/decompression microdiscectomy N/A 09/06/2014    Procedure:  LUMBER DECOMPRESSION L3-5;  Surgeon: Melina Schools, MD;  Location: [place];  Service: Orthopedics;  Laterality: N/A;   Family History  Problem Relation Age of Onset  . Kidney disease Mother   . Colon cancer Neg Hx    Social History  Substance Use Topics  . Smoking status: Former Smoker    Types: Cigarettes  . Smokeless tobacco: Former Systems developer    Quit date: 06/28/2008  . Alcohol Use: 1.2 oz/week    2 Glasses of wine per week     Comment: Occasionally    Review of Systems  All other systems reviewed and are negative.     Allergies  Review of patient's allergies indicates no known allergies.  Home Medications   Prior to Admission medications   Medication Sig Start Date End Date Taking? Authorizing Provider  amLODipine (NORVASC) 10 MG tablet Take 10 mg by mouth daily.   Yes Historical Provider, MD  aspirin EC 81 MG tablet Take 81 mg by mouth daily.   Yes Historical Provider, MD  atorvastatin (LIPITOR) 20 MG tablet Take 20 mg by mouth daily.   Yes Historical Provider, MD  hydrochlorothiazide (HYDRODIURIL) 25 MG tablet Take 25 mg by mouth daily.   Yes Historical Provider, MD  losartan (COZAAR) 100 MG tablet Take 100 mg by mouth daily.   Yes Historical Provider, MD  metFORMIN (GLUMETZA) 1000 MG (MOD) 24 hr tablet Take 1,000 mg by  mouth daily with breakfast.   Yes Historical Provider, MD  metoprolol tartrate (LOPRESSOR) 25 MG tablet Take 25 mg by mouth daily.    Yes Historical Provider, MD   BP 148/80 mmHg  Pulse 64  Temp(Src) 98 F (36.7 C) (Oral)  Resp 18  Wt 221 lb (100.245 kg)  SpO2 96% Physical Exam  Constitutional: He appears well-developed and well-nourished. No distress.  HENT:  Head: Normocephalic and atraumatic.  Mouth/Throat: Oropharynx is clear and moist. No oropharyngeal exudate.  Eyes: Conjunctivae and EOM are normal. Pupils are equal, round, and reactive to light. Right eye exhibits no discharge. Left eye exhibits no discharge. No scleral icterus.  Neck: Normal  range of motion. Neck supple. No JVD present. No thyromegaly present.  Cardiovascular: Normal rate, regular rhythm, normal heart sounds and intact distal pulses.  Exam reveals no gallop and no friction rub.   No murmur heard. Pulmonary/Chest: Effort normal and breath sounds normal. No respiratory distress. He has no wheezes. He has no rales.  Abdominal: Soft. Bowel sounds are normal. He exhibits no distension and no mass. There is no tenderness.  Musculoskeletal: Normal range of motion. He exhibits no edema or tenderness.  Lymphadenopathy:    He has no cervical adenopathy.  Neurological: He is alert. Coordination normal.  Speech is clear, cranial nerves III through XII are intact, memory is intact, strength is normal in all 4 extremities including grips, sensation is intact to light touch and pinprick in all 4 extremities. Coordination as tested by finger-nose-finger is normal, no limb ataxia. Normal gait, normal reflexes at the patellar tendons bilaterally  Skin: Skin is warm and dry. No rash noted. No erythema.  Psychiatric: He has a normal mood and affect. His behavior is normal.  Nursing note and vitals reviewed.   ED Course  Procedures (including critical care time) Labs Review Labs Reviewed  COMPREHENSIVE METABOLIC PANEL - Abnormal; Notable for the following:    Glucose, Bld 105 (*)    BUN 22 (*)    ALT 15 (*)    All other components within normal limits  I-STAT CHEM 8, ED - Abnormal; Notable for the following:    BUN 26 (*)    Glucose, Bld 100 (*)    All other components within normal limits  I-STAT CHEM 8, ED - Abnormal; Notable for the following:    Potassium 5.5 (*)    BUN 31 (*)    All other components within normal limits  PROTIME-INR  APTT  CBC  DIFFERENTIAL  ETHANOL  URINALYSIS, ROUTINE W REFLEX MICROSCOPIC (NOT AT [facility])  URINE RAPID DRUG SCREEN, HOSP PERFORMED  I-STAT TROPOININ, ED  I-STAT TROPOININ, ED    Imaging Review Ct Head Wo Contrast  06/18/2015   CLINICAL DATA:  Acute onset left-sided weakness hypertension EXAM: CT HEAD WITHOUT CONTRAST TECHNIQUE: Contiguous axial images were obtained from the base of the skull through the vertex without intravenous contrast. COMPARISON:  None. FINDINGS: The ventricles are normal in size and configuration. There is no intracranial mass, hemorrhage, extra-axial fluid collection, or midline shift. Gray-white compartments appear normal. No acute infarct evident. Bony calvarium appears intact. The mastoid air cells are clear. No intraorbital lesions apparent. IMPRESSION: Study within normal limits. Electronically Signed   By: Lowella Grip III M.D.   On: 06/18/2015 16:26   I have personally reviewed and evaluated these images and lab results as part of my medical decision-making.  ED ECG REPORT  I personally interpreted this EKG   Date: 06/18/2015  Rate: 60  Rhythm: normal sinus rhythm  QRS Axis: left  Intervals: normal  ST/T Wave abnormalities: nonspecific ST/T changes  Conduction Disutrbances:right bundle branch block  Narrative Interpretation:  Since last tracing LBBB now present - LVH persists  Old EKG Reviewed: changes noted   MDM   Final diagnoses:  Transient cerebral ischemia, unspecified transient cerebral ischemia type    At this time the patient's exam is unremarkable however the history suggests that the patient has been having transient ischemic attacks, they are anatomic and they do suggest focal lateralizing abnormalities consistent with ischemic stroke. CT scan of the brain shows no hemorrhage, labs are unremarkable except for a slight elevation in potassium, we'll check EKG, vital signs reviewed and are unremarkable except for mild hypertension, the patient will be admitted to the hospital for further stroke evaluation. He is in agreement with the plan.  The patient has findings consistent with transient ischemic attack, CT has been read without acute findings, lab work is overall  unremarkable, I discussed the patient's care with the neurologist will come to see the patient as well as with the hospitalist will admit.    Noemi Chapel, MD 06/18/15 2033

## 2015-06-18 NOTE — ED Notes (Signed)
Pt reports left arm numbness and felt like it was drawing up on its self that started yesterday. Reports the symptoms eased off but came back again today. Also reports blurred vision.

## 2015-06-19 ENCOUNTER — Encounter (HOSPITAL_COMMUNITY): Payer: Self-pay | Admitting: Nurse Practitioner

## 2015-06-19 ENCOUNTER — Observation Stay (HOSPITAL_COMMUNITY): Payer: Medicare Other

## 2015-06-19 DIAGNOSIS — Z87891 Personal history of nicotine dependence: Secondary | ICD-10-CM | POA: Diagnosis not present

## 2015-06-19 DIAGNOSIS — G451 Carotid artery syndrome (hemispheric): Secondary | ICD-10-CM | POA: Diagnosis not present

## 2015-06-19 DIAGNOSIS — I6521 Occlusion and stenosis of right carotid artery: Secondary | ICD-10-CM | POA: Diagnosis not present

## 2015-06-19 DIAGNOSIS — G459 Transient cerebral ischemic attack, unspecified: Secondary | ICD-10-CM | POA: Diagnosis not present

## 2015-06-19 DIAGNOSIS — F40240 Claustrophobia: Secondary | ICD-10-CM | POA: Diagnosis present

## 2015-06-19 DIAGNOSIS — E785 Hyperlipidemia, unspecified: Secondary | ICD-10-CM | POA: Diagnosis not present

## 2015-06-19 DIAGNOSIS — E1140 Type 2 diabetes mellitus with diabetic neuropathy, unspecified: Secondary | ICD-10-CM | POA: Diagnosis present

## 2015-06-19 DIAGNOSIS — E119 Type 2 diabetes mellitus without complications: Secondary | ICD-10-CM

## 2015-06-19 DIAGNOSIS — I6523 Occlusion and stenosis of bilateral carotid arteries: Secondary | ICD-10-CM | POA: Diagnosis present

## 2015-06-19 DIAGNOSIS — F17200 Nicotine dependence, unspecified, uncomplicated: Secondary | ICD-10-CM

## 2015-06-19 DIAGNOSIS — R2990 Unspecified symptoms and signs involving the nervous system: Secondary | ICD-10-CM

## 2015-06-19 DIAGNOSIS — G8324 Monoplegia of upper limb affecting left nondominant side: Secondary | ICD-10-CM | POA: Diagnosis present

## 2015-06-19 DIAGNOSIS — I6789 Other cerebrovascular disease: Secondary | ICD-10-CM | POA: Diagnosis not present

## 2015-06-19 DIAGNOSIS — Z951 Presence of aortocoronary bypass graft: Secondary | ICD-10-CM | POA: Diagnosis not present

## 2015-06-19 DIAGNOSIS — I639 Cerebral infarction, unspecified: Secondary | ICD-10-CM | POA: Diagnosis not present

## 2015-06-19 DIAGNOSIS — Z7982 Long term (current) use of aspirin: Secondary | ICD-10-CM | POA: Diagnosis not present

## 2015-06-19 DIAGNOSIS — Z955 Presence of coronary angioplasty implant and graft: Secondary | ICD-10-CM | POA: Diagnosis not present

## 2015-06-19 DIAGNOSIS — R29700 NIHSS score 0: Secondary | ICD-10-CM | POA: Diagnosis present

## 2015-06-19 DIAGNOSIS — I10 Essential (primary) hypertension: Secondary | ICD-10-CM | POA: Diagnosis not present

## 2015-06-19 DIAGNOSIS — I6529 Occlusion and stenosis of unspecified carotid artery: Secondary | ICD-10-CM | POA: Diagnosis not present

## 2015-06-19 DIAGNOSIS — Z6829 Body mass index (BMI) 29.0-29.9, adult: Secondary | ICD-10-CM | POA: Diagnosis not present

## 2015-06-19 DIAGNOSIS — I454 Nonspecific intraventricular block: Secondary | ICD-10-CM | POA: Diagnosis present

## 2015-06-19 DIAGNOSIS — Z7984 Long term (current) use of oral hypoglycemic drugs: Secondary | ICD-10-CM | POA: Diagnosis not present

## 2015-06-19 DIAGNOSIS — I63231 Cerebral infarction due to unspecified occlusion or stenosis of right carotid arteries: Secondary | ICD-10-CM | POA: Diagnosis present

## 2015-06-19 DIAGNOSIS — E7800 Pure hypercholesterolemia, unspecified: Secondary | ICD-10-CM | POA: Diagnosis present

## 2015-06-19 DIAGNOSIS — R200 Anesthesia of skin: Secondary | ICD-10-CM | POA: Diagnosis not present

## 2015-06-19 DIAGNOSIS — I2510 Atherosclerotic heart disease of native coronary artery without angina pectoris: Secondary | ICD-10-CM | POA: Diagnosis not present

## 2015-06-19 LAB — GLUCOSE, CAPILLARY
Glucose-Capillary: 101 mg/dL — ABNORMAL HIGH (ref 65–99)
Glucose-Capillary: 101 mg/dL — ABNORMAL HIGH (ref 65–99)
Glucose-Capillary: 87 mg/dL (ref 65–99)
Glucose-Capillary: 135 mg/dL — ABNORMAL HIGH (ref 65–99)

## 2015-06-19 LAB — CBC
HCT: 40.4 % (ref 39.0–52.0)
Hemoglobin: 13.2 g/dL (ref 13.0–17.0)
MCH: 28.4 pg (ref 26.0–34.0)
MCHC: 32.7 g/dL (ref 30.0–36.0)
MCV: 86.9 fL (ref 78.0–100.0)
Platelets: 190 10*3/uL (ref 150–400)
RBC: 4.65 MIL/uL (ref 4.22–5.81)
RDW: 13.1 % (ref 11.5–15.5)
WBC: 4.5 10*3/uL (ref 4.0–10.5)

## 2015-06-19 LAB — BASIC METABOLIC PANEL
Anion gap: 7 (ref 5–15)
BUN: 17 mg/dL (ref 6–20)
CO2: 27 mmol/L (ref 22–32)
Calcium: 8.8 mg/dL — ABNORMAL LOW (ref 8.9–10.3)
Chloride: 105 mmol/L (ref 101–111)
Creatinine, Ser: 0.84 mg/dL (ref 0.61–1.24)
GFR calc Af Amer: >60 mL/min (ref >60)
GFR calc non Af Amer: >60 mL/min (ref >60)
Glucose, Bld: 105 mg/dL — ABNORMAL HIGH (ref 65–99)
Potassium: 4 mmol/L (ref 3.5–5.1)
Sodium: 139 mmol/L (ref 135–145)

## 2015-06-19 LAB — LIPID PANEL
Cholesterol: 123 mg/dL (ref 0–200)
HDL: 46 mg/dL (ref >40)
LDL Cholesterol: 65 mg/dL (ref 0–99)
Total CHOL/HDL Ratio: 2.7 RATIO
Triglycerides: 61 mg/dL (ref <150)
VLDL: 12 mg/dL (ref 0–40)

## 2015-06-19 MED ORDER — LORAZEPAM 2 MG/ML IJ SOLN
2.0000 mg | Freq: Once | INTRAMUSCULAR | Status: AC | PRN
  Administered 2015-06-19: 2 mg via INTRAVENOUS
  Filled 2015-06-19: qty 1

## 2015-06-19 MED ORDER — CLOPIDOGREL BISULFATE 75 MG PO TABS
75.0000 mg | ORAL_TABLET | Freq: Every day | ORAL | Status: DC
  Administered 2015-06-20 – 2015-06-22 (×3): 75 mg via ORAL
  Filled 2015-06-19 (×3): qty 1

## 2015-06-19 NOTE — Progress Notes (Signed)
STROKE TEAM PROGRESS NOTE   HISTORY OF PRESENT ILLNESS [name] is a 70 y.o. Right handed male with CAD, HTN, Diabetes, HLD, former smoker presents for two discrete episodes of left arm weakness. Yesterday it lasted 15 mins. Today it lasted 45 mins. No numbness, slurred speech, vision changes. No neck pain. He is on aspirin 325mg  daily. Quit smoking 06/28/09. Feels fine now. Patient was not administered TPA secondary to resolved symptoms. He was admitted for further evaluation and treatment.   SUBJECTIVE (INTERVAL HISTORY) His wife is at the bedside.  Overall he feels his condition is completely resolved. He recounted his weakness, denied pain. Reported numbness that radiated up into his neck. Leg fine. Wife reports he snore and will wake himself up during the night.   OBJECTIVE Temp:  [97.7 F (36.5 C)-98.2 F (36.8 C)] 97.9 F (36.6 C) (03/01 0843) Pulse Rate:  [58-72] 58 (03/01 0843) Cardiac Rhythm:  [-] Normal sinus rhythm;Bundle branch block;Heart block (03/01 0700) Resp:  [13-23] 16 (03/01 0843) BP: (118-158)/(59-94) 141/63 mmHg (03/01 0843) SpO2:  [92 %-98 %] 98 % (03/01 0843) Weight:  [232 lb 9.4 oz (105.5 kg)] 232 lb 9.4 oz (105.5 kg) (02/28 2140)  CBC:  Recent Labs Lab 06/18/15 1546  06/18/15 1817 06/19/15 0625  WBC 4.8  --   --  4.5  NEUTROABS 2.9  --   --   --   HGB 13.6  < > 15.0 13.2  HCT 41.7  < > 44.0 40.4  MCV 86.7  --   --  86.9  PLT 195  --   --  190  < > = values in this interval not displayed.  Basic Metabolic Panel:  Recent Labs Lab 06/18/15 1546  06/18/15 1817 06/19/15 0625  NA 140  < > 138 139  K 4.4  < > 5.5* 4.0  CL 105  < > 102 105  CO2 26  --   --  27  GLUCOSE 105*  < > 85 105*  BUN 22*  < > 31* 17  CREATININE 1.04  < > 0.80 0.84  CALCIUM 9.4  --   --  8.8*  < > = values in this interval not displayed.  Lipid Panel:     Component Value Date/Time   CHOL 123 06/19/2015 0625   TRIG 61 06/19/2015 0625   HDL 46 06/19/2015 0625    CHOLHDL 2.7 06/19/2015 0625   VLDL 12 06/19/2015 0625   LDLCALC 65 06/19/2015 0625   HgbA1c:  Lab Results  Component Value Date   HGBA1C 6.4* 09/04/2014   Urine Drug Screen:     Component Value Date/Time   LABOPIA NONE DETECTED 06/18/2015 1901   COCAINSCRNUR NONE DETECTED 06/18/2015 1901   LABBENZ NONE DETECTED 06/18/2015 1901   AMPHETMU NONE DETECTED 06/18/2015 1901   THCU NONE DETECTED 06/18/2015 1901   LABBARB NONE DETECTED 06/18/2015 1901      IMAGING I have personally reviewed the radiological images below and agree with the radiology interpretations.  Ct Head Wo Contrast  06/18/2015 IMPRESSION: Study within normal limits.   Mri brain and Mra head Wo Contrast  06/19/2015  IMPRESSION: 1. Punctate acute infarct in the right parietal cortex. 2. No prior infarct or white matter disease. 3. Negative intracranial MRA.   CUS - pending  TTE - pending    PHYSICAL EXAM Physical exam  Temp:  [97.7 F (36.5 C)-98.2 F (36.8 C)] 97.9 F (36.6 C) (03/01 0843) Pulse Rate:  [58-72] 58 (03/01  [id number]) Resp:  [13-23] 16 (03/01 0843) BP: (118-158)/(59-94) 141/63 mmHg (03/01 0843) SpO2:  [92 %-98 %] 98 % (03/01 0843) Weight:  [232 lb 9.4 oz (105.5 kg)] 232 lb 9.4 oz (105.5 kg) (02/28 2140)  General - Well nourished, well developed, in no apparent distress.  Ophthalmologic - Sharp disc margins OU.  Cardiovascular - Regular rate and rhythm.  Mental Status -  Level of arousal and orientation to time, place, and person were intact. Language including expression, naming, repetition, comprehension was assessed and found intact. Fund of Knowledge was assessed and was intact.  Cranial Nerves II - XII - II - Visual field intact OU. III, IV, VI - Extraocular movements intact. V - Facial sensation intact bilaterally. VII - Facial movement intact bilaterally. VIII - Hearing & vestibular intact bilaterally. X - Palate elevates symmetrically. XI - Chin turning & shoulder shrug  intact bilaterally. XII - Tongue protrusion intact.  Motor Strength - The patient's strength was normal in all extremities and pronator drift was absent.  Bulk was normal and fasciculations were absent.   Motor Tone - Muscle tone was assessed at the neck and appendages and was normal.  Reflexes - The patient's reflexes were 1+ in all extremities and he had no pathological reflexes.  Sensory - Light touch, temperature/pinprick were assessed and were symmetrical.    Coordination - The patient had normal movements in the hands and feet with no ataxia or dysmetria.  Tremor was absent.  Gait and Station - deferred to PT due to safety concerns   ASSESSMENT/PLAN [name] is a 70 y.o. male with history of CAD, HTN, Diabetes, HLD, former smokes presenting with 2 separate incidents of of L arm weakness. He did not receive IV t-PA due to resolved symptoms.   Stroke - right posterior parietal punctate infarct, etiology uncleare. Too small for an embolic event, but location does not support small vessel disease.   Resultant  Left arm weakness resolved  MRI right posterior parietal punctate DWI changes  MRA  unremarkable   Carotid Doppler  Pending recent report from Dr. Einar Gip  2D Echo  Pending  Recommend 30 day cardiac event monitoring to rule out afib  LDL 65  HgbA1c pending  Lovenox 40 mg sq daily for VTE prophylaxis Diet heart healthy/carb modified Room service appropriate?: Yes; Fluid consistency:: Thin  aspirin 81 mg daily prior to admission, now on aspirin 325 mg daily. Changed to plavix for stroke prevention. Continue plavix on discharge.  Patient counseled to be compliant with his antithrombotic medications  Ongoing aggressive stroke risk factor management  Therapy recommendations:  No therapy needs  Disposition:  Return home  Hypertension  Stable  Permissive hypertension (OK if < 220/120) but gradually normalize in 5-7 days  Hyperlipidemia  Home meds:   Lipitor 20, resumed in hospital  LDL 65, goal < 70  Continue statin at discharge  Diabetes type II Diabetic Neuropathy  HgbA1c pending, goal < 7.0  Other Stroke Risk Factors  Advanced age  Former Cigarette smoker, quit smoking 06/28/09  No hx smokeless tobacco user  ETOH use  Coronary artery disease - s/p CABG  Patient snores, consider OP workup for sleep apnea  Hospital day # 1  [name], MD PhD Stroke Neurology 06/19/2015 6:10 PM      To contact Stroke Continuity provider, please refer to [email or web address] After hours, contact General Neurology

## 2015-06-19 NOTE — Progress Notes (Signed)
[facility] PROGRESS NOTE  [name] [id number] DOB: November 08, 1945 DOA: 06/18/2015 PCP: Antony Blackbird, MD  Assessment/Plan:  Principal Problem:  left arm numbness/strokelike symptoms -Neurology consulted -Plan is to work patient up for stroke. He was unable to get MRI secondary to claustrophobia will plan on using Ativan when necessary at higher dose (2mg  IV once prn)  Active Problems:   TOBACCO ABUSE - will recommend cessation    Diabetes mellitus without complication ([place]) - patient is currently on sliding scale insulin with night coverage. Also on carb modified diet - we'll continue to monitor blood sugars    Hypertension - patient is currently on metoprolol,  Losartan, as well as amlodipine. Blood pressure medication was not held on admission. Given suspicion for stroke will hold. If MRI is negative for resume antihypertensive regimen    Hyperlipidemia - patient is currently on statin will plan on continuing  Code Status: full Family Communication: discussed with patient and spouse at bedside Disposition Plan: pending workup   Consultants:  Neurology  Procedures:  Please refer to chart and or Neuro recommendations  Antibiotics:  None  HPI/Subjective: Pt has no new complaints today.  Objective: Filed Vitals:   06/19/15 0650 06/19/15 0843  BP: 118/70 141/63  Pulse: 66 58  Temp: 98.2 F (36.8 C) 97.9 F (36.6 C)  Resp: 16 16   No intake or output data in the 24 hours ending 06/19/15 1049 Filed Weights   06/18/15 1518 06/18/15 2140  Weight: 100.245 kg (221 lb) 105.5 kg (232 lb 9.4 oz)    Exam:   General:  Pt in nad, alert and awake  Cardiovascular: rrr, no mrg  Respiratory: cta bl, no wheezes  Abdomen: soft, nt, nd  Musculoskeletal: no cyanosis or clubbing  Data Reviewed: Basic Metabolic Panel:  Recent Labs Lab 06/18/15 1546 06/18/15 1609 06/18/15 1817 06/19/15 0625  NA 140 141 138 139  K 4.4 4.3 5.5* 4.0  CL 105 101  102 105  CO2 26  --   --  27  GLUCOSE 105* 100* 85 105*  BUN 22* 26* 31* 17  CREATININE 1.04 1.00 0.80 0.84  CALCIUM 9.4  --   --  8.8*   Liver Function Tests:  Recent Labs Lab 06/18/15 1546  AST 21  ALT 15*  ALKPHOS 66  BILITOT 0.9  PROT 7.0  ALBUMIN 4.2   No results for input(s): LIPASE, AMYLASE in the last 168 hours. No results for input(s): AMMONIA in the last 168 hours. CBC:  Recent Labs Lab 06/18/15 1546 06/18/15 1609 06/18/15 1817 06/19/15 0625  WBC 4.8  --   --  4.5  NEUTROABS 2.9  --   --   --   HGB 13.6 15.3 15.0 13.2  HCT 41.7 45.0 44.0 40.4  MCV 86.7  --   --  86.9  PLT 195  --   --  190   Cardiac Enzymes: No results for input(s): CKTOTAL, CKMB, CKMBINDEX, TROPONINI in the last 168 hours. BNP (last 3 results) No results for input(s): BNP in the last 8760 hours.  ProBNP (last 3 results) No results for input(s): PROBNP in the last 8760 hours.  CBG:  Recent Labs Lab 06/18/15 2245 06/19/15 0636  GLUCAP 186* 101*    No results found for this or any previous visit (from the past 240 hour(s)).   Studies: Ct Head Wo Contrast  06/18/2015  CLINICAL DATA:  Acute onset left-sided weakness hypertension EXAM: CT HEAD WITHOUT CONTRAST TECHNIQUE: Contiguous axial images were  obtained from the base of the skull through the vertex without intravenous contrast. COMPARISON:  None. FINDINGS: The ventricles are normal in size and configuration. There is no intracranial mass, hemorrhage, extra-axial fluid collection, or midline shift. Gray-white compartments appear normal. No acute infarct evident. Bony calvarium appears intact. The mastoid air cells are clear. No intraorbital lesions apparent. IMPRESSION: Study within normal limits. Electronically Signed   By: Lowella Grip III M.D.   On: 06/18/2015 16:26    Scheduled Meds: . amLODipine  10 mg Oral Daily  . aspirin  300 mg Rectal Daily   Or  . aspirin  325 mg Oral Daily  . atorvastatin  20 mg Oral Daily  .  enoxaparin (LOVENOX) injection  40 mg Subcutaneous Q24H  . insulin aspart  0-5 Units Subcutaneous QHS  . insulin aspart  0-9 Units Subcutaneous TID WC  . losartan  100 mg Oral Daily  . metoprolol tartrate  25 mg Oral Daily  . sodium chloride flush  3 mL Intravenous Q12H   Continuous Infusions:   Time spent: > 35 minutes  [name]  [facility] Pager [phone] If 7PM-7AM, please contact night-coverage at [email or web address], password [facility] 06/19/2015, 10:49 AM

## 2015-06-19 NOTE — Progress Notes (Signed)
Patient came back.  MRI not done.  Patient remained wide awake despite IV ativan.  He is extremely claustrophobic.

## 2015-06-19 NOTE — Progress Notes (Signed)
Pt ambulated standby assist to the bathroom. He denies any numbness or discomfort. Will continue to monitor.

## 2015-06-19 NOTE — Progress Notes (Signed)
PT Cancellation Note  Patient Details Name: [name] MRN: [id number] DOB: 1945/07/10   Cancelled Treatment:    Reason Eval/Treat Not Completed: Patient at procedure or test/unavailable; patient out of room for testing, will attempt to see tomorrow.   Reginia Naas 06/19/2015, 2:55 PM  Magda Kiel, Pierre Part 06/19/2015

## 2015-06-19 NOTE — Care Management Note (Signed)
Case Management Note  Patient Details  Name: [name] MRN: [id number] Date of Birth: 20-May-1945  Subjective/Objective:    Patient admitted with r/o CVA. Pt needing a MRI. Patient is from home with his wife.                 Action/Plan: Awaiting test results and PT/OT recommendations. CM will follow for discharge needs.   Expected Discharge Date:  06/21/15               Expected Discharge Plan:     In-House Referral:     Discharge planning Services     Post Acute Care Choice:    Choice offered to:     DME Arranged:    DME Agency:     HH Arranged:    HH Agency:     Status of Service:  In process, will continue to follow  Medicare Important Message Given:    Date Medicare IM Given:    Medicare IM give by:    Date Additional Medicare IM Given:    Additional Medicare Important Message give by:     If discussed at [place] of Stay Meetings, dates discussed:    Additional Comments:  Pollie Friar, RN 06/19/2015, 2:52 PM

## 2015-06-19 NOTE — Progress Notes (Signed)
IV ativan  0.5mg  given prior to send patient for MRI.

## 2015-06-19 NOTE — Progress Notes (Signed)
Pt stated that he was too claustrophobic for MRI. Ativan 0.5mg  kept him up during test. Spoke with Md to get increase in dose of Ativan. MRI to be rescheduled.  Pt aware.

## 2015-06-20 ENCOUNTER — Inpatient Hospital Stay (HOSPITAL_COMMUNITY): Payer: PRIVATE HEALTH INSURANCE

## 2015-06-20 ENCOUNTER — Inpatient Hospital Stay (HOSPITAL_COMMUNITY): Payer: Medicare Other

## 2015-06-20 DIAGNOSIS — I639 Cerebral infarction, unspecified: Principal | ICD-10-CM

## 2015-06-20 DIAGNOSIS — I6789 Other cerebrovascular disease: Secondary | ICD-10-CM

## 2015-06-20 LAB — GLUCOSE, CAPILLARY
Glucose-Capillary: 101 mg/dL — ABNORMAL HIGH (ref 65–99)
Glucose-Capillary: 88 mg/dL (ref 65–99)
Glucose-Capillary: 88 mg/dL (ref 65–99)
Glucose-Capillary: 113 mg/dL — ABNORMAL HIGH (ref 65–99)

## 2015-06-20 LAB — HEMOGLOBIN A1C
Hgb A1c MFr Bld: 6.2 % — ABNORMAL HIGH (ref 4.8–5.6)
Mean Plasma Glucose: 131 mg/dL

## 2015-06-20 MED ORDER — IOHEXOL 350 MG/ML SOLN
75.0000 mL | Freq: Once | INTRAVENOUS | Status: AC | PRN
  Administered 2015-06-20: 100 mL via INTRAVENOUS

## 2015-06-20 NOTE — Progress Notes (Signed)
PT Cancellation Note  Patient Details Name: [name] MRN: [id number] DOB: Aug 05, 1945   Cancelled Treatment:    Reason Eval/Treat Not Completed: PT screened, no needs identified, will sign off  Spoke with Dr. Erlinda Hong and Burnetta Sabin, NP and pt has no PT needs as his symptoms have resolved.   Coley Kulikowski 06/20/2015, 12:48 PM  Pager [phone]

## 2015-06-20 NOTE — Progress Notes (Signed)
[facility] PROGRESS NOTE  [name] [id number] DOB: May 02, 1945 DOA: 06/18/2015 PCP: Antony Blackbird, MD  Assessment/Plan:  Principal Problem:  left arm numbness/strokelike symptoms - Neurology consulted, recommended d/c after stroke work up complete -Work up pending. Echocardiogram done today and results pending. Carotids completed and no reports of stricture per my discussion with neurology team - MRI of brain reported punctate acute infarct in the right parietal cortex  Active Problems:   TOBACCO ABUSE - will recommend cessation    Diabetes mellitus without complication ([place]) - patient is currently on sliding scale insulin with night coverage. Also on carb modified diet - we'll continue to monitor blood sugars    Hypertension - patient is currently on metoprolol,  Losartan, as well as amlodipine. Blood pressure medication was not held on admission. Given suspicion for stroke currently on hold. Currently blood pressure wnl. If blood pressure rises will restart some of patient's medications.    Hyperlipidemia - patient is currently on statin will plan on continuing  Code Status: full Family Communication: discussed with patient and spouse at bedside Disposition Plan: pending workup   Consultants:  Neurology  Procedures:  Please refer to chart and or Neuro recommendations  Antibiotics:  None  HPI/Subjective: Pt has no new complaints today. He is inquiring about hospital discharge.  Objective: Filed Vitals:   06/20/15 0411 06/20/15 1018  BP: 140/87 134/68  Pulse: 61 62  Temp: 97.9 F (36.6 C) 98 F (36.7 C)  Resp: 16 17   No intake or output data in the 24 hours ending 06/20/15 1409 Filed Weights   06/18/15 1518 06/18/15 2140  Weight: 100.245 kg (221 lb) 105.5 kg (232 lb 9.4 oz)    Exam:   General:  Pt in nad, alert and awake  Cardiovascular: rrr, no mrg  Respiratory: cta bl, no wheezes  Abdomen: soft, nt, nd  Musculoskeletal: no  cyanosis or clubbing  Data Reviewed: Basic Metabolic Panel:  Recent Labs Lab 06/18/15 1546 06/18/15 1609 06/18/15 1817 06/19/15 0625  NA 140 141 138 139  K 4.4 4.3 5.5* 4.0  CL 105 101 102 105  CO2 26  --   --  27  GLUCOSE 105* 100* 85 105*  BUN 22* 26* 31* 17  CREATININE 1.04 1.00 0.80 0.84  CALCIUM 9.4  --   --  8.8*   Liver Function Tests:  Recent Labs Lab 06/18/15 1546  AST 21  ALT 15*  ALKPHOS 66  BILITOT 0.9  PROT 7.0  ALBUMIN 4.2   No results for input(s): LIPASE, AMYLASE in the last 168 hours. No results for input(s): AMMONIA in the last 168 hours. CBC:  Recent Labs Lab 06/18/15 1546 06/18/15 1609 06/18/15 1817 06/19/15 0625  WBC 4.8  --   --  4.5  NEUTROABS 2.9  --   --   --   HGB 13.6 15.3 15.0 13.2  HCT 41.7 45.0 44.0 40.4  MCV 86.7  --   --  86.9  PLT 195  --   --  190   Cardiac Enzymes: No results for input(s): CKTOTAL, CKMB, CKMBINDEX, TROPONINI in the last 168 hours. BNP (last 3 results) No results for input(s): BNP in the last 8760 hours.  ProBNP (last 3 results) No results for input(s): PROBNP in the last 8760 hours.  CBG:  Recent Labs Lab 06/19/15 1154 06/19/15 1631 06/19/15 2155 06/20/15 0632 06/20/15 1148  GLUCAP 101* 87 135* 101* 88    No results found for this or any previous  visit (from the past 240 hour(s)).   Studies: Ct Head Wo Contrast  06/18/2015  CLINICAL DATA:  Acute onset left-sided weakness hypertension EXAM: CT HEAD WITHOUT CONTRAST TECHNIQUE: Contiguous axial images were obtained from the base of the skull through the vertex without intravenous contrast. COMPARISON:  None. FINDINGS: The ventricles are normal in size and configuration. There is no intracranial mass, hemorrhage, extra-axial fluid collection, or midline shift. Gray-white compartments appear normal. No acute infarct evident. Bony calvarium appears intact. The mastoid air cells are clear. No intraorbital lesions apparent. IMPRESSION: Study within  normal limits. Electronically Signed   By: Lowella Grip III M.D.   On: 06/18/2015 16:26   Mr Brain Wo Contrast  06/19/2015  CLINICAL DATA:  Two transient episodes of left arm weakness yesterday and today. EXAM: MRI HEAD WITHOUT CONTRAST MRA HEAD WITHOUT CONTRAST TECHNIQUE: Multiplanar, multiecho pulse sequences of the brain and surrounding structures were obtained without intravenous contrast. Angiographic images of the head were obtained using MRA technique without contrast. COMPARISON:  Head CT from yesterday FINDINGS: MRI HEAD FINDINGS Calvarium and upper cervical spine: No focal marrow signal abnormality. Cervical facet arthropathy. Dermal inclusion cyst in left parietal scalp. Orbits: Negative. Sinuses and Mastoids: Clear. Brain: Punctate restricted diffusion in the right occipital cortex. No signs of hemorrhage. MRA HEAD FINDINGS Symmetric carotid arteries. Mild right vertebral artery dominance. Dominant left PICA and right AICA. Intact circle-of-Willis. No major branch occlusion or flow limiting stenosis. No vessel irregularity. Negative for aneurysm. Infundibulum at the bilateral posterior communicating arteries. IMPRESSION: 1. Punctate acute infarct in the right parietal cortex. 2. No prior infarct or white matter disease. 3. Negative intracranial MRA. Electronically Signed   By: Monte Fantasia M.D.   On: 06/19/2015 15:37   Mr Jodene Nam Head/brain Wo Cm  06/19/2015  CLINICAL DATA:  Two transient episodes of left arm weakness yesterday and today. EXAM: MRI HEAD WITHOUT CONTRAST MRA HEAD WITHOUT CONTRAST TECHNIQUE: Multiplanar, multiecho pulse sequences of the brain and surrounding structures were obtained without intravenous contrast. Angiographic images of the head were obtained using MRA technique without contrast. COMPARISON:  Head CT from yesterday FINDINGS: MRI HEAD FINDINGS Calvarium and upper cervical spine: No focal marrow signal abnormality. Cervical facet arthropathy. Dermal inclusion cyst in  left parietal scalp. Orbits: Negative. Sinuses and Mastoids: Clear. Brain: Punctate restricted diffusion in the right occipital cortex. No signs of hemorrhage. MRA HEAD FINDINGS Symmetric carotid arteries. Mild right vertebral artery dominance. Dominant left PICA and right AICA. Intact circle-of-Willis. No major branch occlusion or flow limiting stenosis. No vessel irregularity. Negative for aneurysm. Infundibulum at the bilateral posterior communicating arteries. IMPRESSION: 1. Punctate acute infarct in the right parietal cortex. 2. No prior infarct or white matter disease. 3. Negative intracranial MRA. Electronically Signed   By: Monte Fantasia M.D.   On: 06/19/2015 15:37    Scheduled Meds: . atorvastatin  20 mg Oral Daily  . clopidogrel  75 mg Oral Daily  . enoxaparin (LOVENOX) injection  40 mg Subcutaneous Q24H  . insulin aspart  0-5 Units Subcutaneous QHS  . insulin aspart  0-9 Units Subcutaneous TID WC  . sodium chloride flush  3 mL Intravenous Q12H   Continuous Infusions:   Time spent: > 35 minutes  [name]  [facility] Pager [phone] If 7PM-7AM, please contact night-coverage at [email or web address], password [facility] 06/20/2015, 2:09 PM  LOS: 1 day

## 2015-06-20 NOTE — Progress Notes (Signed)
STROKE TEAM PROGRESS NOTE   SUBJECTIVE (INTERVAL HISTORY) Patient's wife at the bedside. Awaiting carotid doppler results from Dr. Kirstie [name] office. Had echo this am. No new complaints.    OBJECTIVE Temp:  [97.7 F (36.5 C)-98.4 F (36.9 C)] 98 F (36.7 C) (03/02 1018) Pulse Rate:  [61-84] 62 (03/02 1018) Cardiac Rhythm:  [-] Normal sinus rhythm;Bundle branch block (03/01 1900) Resp:  [16-17] 17 (03/02 1018) BP: (126-140)/(68-87) 134/68 mmHg (03/02 1018) SpO2:  [92 %-96 %] 96 % (03/02 1018)  CBC:  Recent Labs Lab 06/18/15 1546  06/18/15 1817 06/19/15 0625  WBC 4.8  --   --  4.5  NEUTROABS 2.9  --   --   --   HGB 13.6  < > 15.0 13.2  HCT 41.7  < > 44.0 40.4  MCV 86.7  --   --  86.9  PLT 195  --   --  190  < > = values in this interval not displayed.  Basic Metabolic Panel:  Recent Labs Lab 06/18/15 1546  06/18/15 1817 06/19/15 0625  NA 140  < > 138 139  K 4.4  < > 5.5* 4.0  CL 105  < > 102 105  CO2 26  --   --  27  GLUCOSE 105*  < > 85 105*  BUN 22*  < > 31* 17  CREATININE 1.04  < > 0.80 0.84  CALCIUM 9.4  --   --  8.8*  < > = values in this interval not displayed.  Lipid Panel:     Component Value Date/Time   CHOL 123 06/19/2015 0625   TRIG 61 06/19/2015 0625   HDL 46 06/19/2015 0625   CHOLHDL 2.7 06/19/2015 0625   VLDL 12 06/19/2015 0625   LDLCALC 65 06/19/2015 0625   HgbA1c:  Lab Results  Component Value Date   HGBA1C 6.2* 06/19/2015   Urine Drug Screen:     Component Value Date/Time   LABOPIA NONE DETECTED 06/18/2015 1901   COCAINSCRNUR NONE DETECTED 06/18/2015 1901   LABBENZ NONE DETECTED 06/18/2015 1901   AMPHETMU NONE DETECTED 06/18/2015 1901   THCU NONE DETECTED 06/18/2015 1901   LABBARB NONE DETECTED 06/18/2015 1901      IMAGING Dr. Erlinda [name] has personally reviewed the radiological images below and agree with the radiology interpretations.  Ct Head Wo Contrast 06/18/2015 IMPRESSION: Study within normal limits.   Mri brain and Mra head Wo  Contrast 06/19/2015  IMPRESSION: 1. Punctate acute infarct in the right parietal cortex. 2. No prior infarct or white matter disease. 3. Negative intracranial MRA.   CUS  Done 04/2015 by Dr. Einar [name] - right ICA 16-49% stenosis   TTE pending   CTA neck - pending   PHYSICAL EXAM Temp:  [97.7 F (36.5 C)-98.4 F (36.9 C)] 98 F (36.7 C) (03/02 1018) Pulse Rate:  [61-84] 62 (03/02 1018) Resp:  [16-17] 17 (03/02 1018) BP: (126-140)/(68-87) 134/68 mmHg (03/02 1018) SpO2:  [92 %-96 %] 96 % (03/02 1018)  General - Well nourished, well developed, in no apparent distress.  Ophthalmologic - Sharp disc margins OU.  Cardiovascular - Regular rate and rhythm.  Mental Status -  Level of arousal and orientation to time, place, and person were intact. Language including expression, naming, repetition, comprehension was assessed and found intact. Fund of Knowledge was assessed and was intact.  Cranial Nerves II - XII - II - Visual field intact OU. III, IV, VI - Extraocular movements intact. V - Facial sensation intact bilaterally.  VII - Facial movement intact bilaterally. VIII - Hearing & vestibular intact bilaterally. X - Palate elevates symmetrically. XI - Chin turning & shoulder shrug intact bilaterally. XII - Tongue protrusion intact.  Motor Strength - The patient's strength was normal in all extremities and pronator drift was absent.  Bulk was normal and fasciculations were absent.   Motor Tone - Muscle tone was assessed at the neck and appendages and was normal.  Reflexes - The patient's reflexes were 1+ in all extremities and he had no pathological reflexes.  Sensory - Light touch, temperature/pinprick were assessed and were symmetrical.    Coordination - The patient had normal movements in the hands and feet with no ataxia or dysmetria.  Tremor was absent.  Gait and Station - deferred to PT due to safety concerns   ASSESSMENT/PLAN [name] is a 70 y.o. male with  history of CAD, HTN, Diabetes, HLD, former smokes presenting with 2 separate incidents of of L arm weakness. He did not receive IV t-PA due to resolved symptoms.   Stroke - right posterior parietal punctate infarct, etiology unclare, by location embolic event can not be ruled out.   Resultant  Left arm weakness resolved  MRI right posterior parietal punctate DWI changes  MRA  unremarkable   Carotid Doppler  04/2015 by Dr. Einar [name] right ICA 15-49% stenosis   Will do CTA neck to further evaluate right ICA  2D Echo  pending   Recommend 30 day cardiac event monitoring to rule out afib  LDL 65  HgbA1c 6.2  Lovenox 40 mg sq daily for VTE prophylaxis Diet heart healthy/carb modified Room service appropriate?: Yes; Fluid consistency:: Thin  aspirin 81 mg daily prior to admission, now on aspirin 325 mg daily. Changed to plavix for stroke prevention. Continue plavix on discharge.  Patient counseled to be compliant with his antithrombotic medications  Ongoing aggressive stroke risk factor management  Therapy recommendations:  No therapy needs  Disposition:  Return home  [name] for discharge from stroke standpoint once studies completed  followup Dr. Erlinda [name] in 2 months, stroke clinic, order placed  Hypertension  Stable  Permissive hypertension (OK if < 220/120) but gradually normalize in 5-7 days  Hyperlipidemia  Home meds:  Lipitor 20, resumed in hospital  LDL 65, goal < 70  Continue statin at discharge  Diabetes type II Diabetic Neuropathy  HgbA1c 6.2, goal < 7.0  Other Stroke Risk Factors  Advanced age  Former Cigarette smoker, quit smoking 06/28/09  No hx smokeless tobacco user  ETOH use  Coronary artery disease - s/p CABG  Patient snores, consider OP workup for sleep apnea  Hospital day # 1  Mark Hawking, MD PhD Stroke Neurology 06/20/2015 5:36 PM   To contact Stroke Continuity provider, please refer to [email or web address] After hours, contact General Neurology

## 2015-06-20 NOTE — Progress Notes (Signed)
  Echocardiogram 2D Echocardiogram has been performed.  Darlina Sicilian M 06/20/2015, 12:29 PM

## 2015-06-21 DIAGNOSIS — I6521 Occlusion and stenosis of right carotid artery: Secondary | ICD-10-CM | POA: Insufficient documentation

## 2015-06-21 LAB — GLUCOSE, CAPILLARY
Glucose-Capillary: 99 mg/dL (ref 65–99)
Glucose-Capillary: 78 mg/dL (ref 65–99)
Glucose-Capillary: 86 mg/dL (ref 65–99)
Glucose-Capillary: 103 mg/dL — ABNORMAL HIGH (ref 65–99)

## 2015-06-21 MED ORDER — ASPIRIN EC 81 MG PO TBEC
81.0000 mg | DELAYED_RELEASE_TABLET | Freq: Every day | ORAL | Status: DC
  Administered 2015-06-22: 81 mg via ORAL
  Filled 2015-06-21: qty 1

## 2015-06-21 NOTE — Care Management Important Message (Signed)
Important Message  Patient Details  Name: [name] MRN: [id number] Date of Birth: 10/26/1945   Medicare Important Message Given:  Yes    [name] P [name] 06/21/2015, 4:06 PM

## 2015-06-21 NOTE — Progress Notes (Addendum)
STROKE TEAM PROGRESS NOTE   SUBJECTIVE (INTERVAL HISTORY) The patient's wife is at the bedside. The patient is without complaints. I discussed the patient's carotid artery stenosis and soft plaque on the right. We recommended a vascular surgery consult prior to discharge.   OBJECTIVE Temp:  [97.7 F (36.5 C)-98.4 F (36.9 C)] 97.8 F (36.6 C) (03/03 0907) Pulse Rate:  [65-76] 76 (03/03 0907) Cardiac Rhythm:  [-] Normal sinus rhythm (03/03 0720) Resp:  [17-20] 20 (03/03 0907) BP: (123-149)/(67-86) 123/82 mmHg (03/03 0907) SpO2:  [93 %-95 %] 94 % (03/03 0907)  CBC:  Recent Labs Lab 06/18/15 1546  06/18/15 1817 06/19/15 0625  WBC 4.8  --   --  4.5  NEUTROABS 2.9  --   --   --   HGB 13.6  < > 15.0 13.2  HCT 41.7  < > 44.0 40.4  MCV 86.7  --   --  86.9  PLT 195  --   --  190  < > = values in this interval not displayed.  Basic Metabolic Panel:  Recent Labs Lab 06/18/15 1546  06/18/15 1817 06/19/15 0625  NA 140  < > 138 139  K 4.4  < > 5.5* 4.0  CL 105  < > 102 105  CO2 26  --   --  27  GLUCOSE 105*  < > 85 105*  BUN 22*  < > 31* 17  CREATININE 1.04  < > 0.80 0.84  CALCIUM 9.4  --   --  8.8*  < > = values in this interval not displayed.  Lipid Panel:     Component Value Date/Time   CHOL 123 06/19/2015 0625   TRIG 61 06/19/2015 0625   HDL 46 06/19/2015 0625   CHOLHDL 2.7 06/19/2015 0625   VLDL 12 06/19/2015 0625   LDLCALC 65 06/19/2015 0625   HgbA1c:  Lab Results  Component Value Date   HGBA1C 6.2* 06/19/2015   Urine Drug Screen:     Component Value Date/Time   LABOPIA NONE DETECTED 06/18/2015 1901   COCAINSCRNUR NONE DETECTED 06/18/2015 1901   LABBENZ NONE DETECTED 06/18/2015 1901   AMPHETMU NONE DETECTED 06/18/2015 1901   THCU NONE DETECTED 06/18/2015 1901   LABBARB NONE DETECTED 06/18/2015 1901      IMAGING I have personally reviewed the radiological images below and agree with the radiology interpretations.  Ct Head Wo Contrast 06/18/2015   Study within normal limits.   Mri brain and Mra head Wo Contrast 06/19/2015    1. Punctate acute infarct in the right parietal cortex.  2. No prior infarct or white matter disease.  3. Negative intracranial MRA.   CUS  Done 04/2015 by Dr. Einar Gip - right ICA 16-49% stenosis   TTE  06/20/2015 Study Conclusions - Left ventricle: The cavity size was normal. Wall thickness was normal. Systolic function was normal. The estimated ejection fraction was in the range of 50% to 55%. Wall motion was normal; there were no regional wall motion abnormalities. Features are consistent with a pseudonormal left ventricular filling pattern, with concomitant abnormal relaxation and increased filling pressure (grade 2 diastolic dysfunction). - Aortic valve: Mildly calcified annulus. Moderately thickened, mildly calcified leaflets. - Left atrium: The atrium was mildly dilated.  CTA neck  06/20/2015 Atherosclerosis results in approximately 65% stenosis proximal RIGHT internal carotid artery. Less than 50% stenosis LEFT internal carotid artery origin. Mild stenosis of LEFT vertebral artery origin.    PHYSICAL EXAM Temp:  [97.7 F (36.5 C)-98.4 F (  36.9 C)] 97.8 F (36.6 C) (03/03 0907) Pulse Rate:  [65-76] 76 (03/03 0907) Resp:  [17-20] 20 (03/03 0907) BP: (123-149)/(67-86) 123/82 mmHg (03/03 0907) SpO2:  [93 %-95 %] 94 % (03/03 0907)  General - Well nourished, well developed, in no apparent distress.  Ophthalmologic - Sharp disc margins OU.  Cardiovascular - Regular rate and rhythm.  Mental Status -  Level of arousal and orientation to time, place, and person were intact. Language including expression, naming, repetition, comprehension was assessed and found intact. Fund of Knowledge was assessed and was intact.  Cranial Nerves II - XII - II - Visual field intact OU. III, IV, VI - Extraocular movements intact. V - Facial sensation intact bilaterally. VII - Facial movement  intact bilaterally. VIII - Hearing & vestibular intact bilaterally. X - Palate elevates symmetrically. XI - Chin turning & shoulder shrug intact bilaterally. XII - Tongue protrusion intact.  Motor Strength - The patient's strength was normal in all extremities and pronator drift was absent.  Bulk was normal and fasciculations were absent.   Motor Tone - Muscle tone was assessed at the neck and appendages and was normal.  Reflexes - The patient's reflexes were 1+ in all extremities and he had no pathological reflexes.  Sensory - Light touch, temperature/pinprick were assessed and were symmetrical.    Coordination - The patient had normal movements in the hands and feet with no ataxia or dysmetria.  Tremor was absent.  Gait and Station - deferred to PT due to safety concerns   ASSESSMENT/PLAN Mr. YI GUILMETTE is a 70 y.o. male with history of CAD, HTN, Diabetes, HLD, former smokes presenting with 2 separate incidents of of L arm weakness. He did not receive IV t-PA due to resolved symptoms.   Stroke - right posterior parietal punctate infarct, etiology unclear, by location embolic event can not be ruled out. His symptoms certainly larger than the MRI finding. Due to right ICA 65% stenosis associated with soft plaque at the stenosis site, vascular surgery consultation and consideration of CEA is warranted.  Resultant  Left arm weakness resolved  MRI right posterior parietal punctate DWI changes  MRA  unremarkable   Carotid Doppler  04/2015 by Dr. Einar Gip right ICA 15-49% stenosis   CTA of the neck - approximately 65% stenosis proximal RIGHT ICA and soft plaque at the stenosis site, potentially embolic source.  2D Echo  EF 50-55%. No cardiac source of emboli identified. Grade 2 diastolic dysfunction.  Also recommend 30 day cardiac event monitoring to rule out afib  LDL 65  HgbA1c 6.2  Lovenox 40 mg sq daily for VTE prophylaxis Diet heart healthy/carb modified Room service  appropriate?: Yes; Fluid consistency:: Thin  aspirin 81 mg daily prior to admission, now on aspirin 325 mg daily. Recommend dual antiplatelet with ASA and plavix for 3 months and then plavix alone.  Patient counseled to be compliant with his antithrombotic medications  Ongoing aggressive stroke risk factor management  Therapy recommendations:  No therapy needs  Disposition:  Return home  Hypertension  Stable  Permissive hypertension (OK if < 220/120) but gradually normalize in 5-7 days  Hyperlipidemia  Home meds:  Lipitor 20, resumed in hospital  LDL 65, goal < 70  Continue statin at discharge  Diabetes type II Diabetic Neuropathy  HgbA1c 6.2, goal < 7.0  controlled  Other Stroke Risk Factors  Advanced age  Former Cigarette smoker, quit smoking 06/28/09  ETOH use  Coronary artery disease - s/p CABG  Patient snores, consider OP workup for sleep apnea  Carotid artery stenosis  Hospital day # 1  Neurology will sign off. Please call with questions. Pt will follow up with Dr. Erlinda Hong at [facility] in about 2 months. Thanks for the consult.   [name], MD PhD Stroke Neurology 06/21/2015 6:11 PM  To contact Stroke Continuity provider, please refer to [email or web address] After hours, contact General Neurology

## 2015-06-21 NOTE — Progress Notes (Signed)
[facility] PROGRESS NOTE  [name] [id number] DOB: [birth year] DOA: 06/18/2015 PCP: Antony Blackbird, MD  Assessment/Plan:  Principal Problem:  left arm numbness/strokelike symptoms - Neurology consulted, recommended consulting vascular surgeon for evaluation regarding right carotid stenosis (consult placed 3/3) - MRI of brain reported punctate acute infarct in the right parietal cortex -No needs identified by PT and they have signed off  Active Problems:   TOBACCO ABUSE - will recommend cessation    Diabetes mellitus without complication ([name]) - patient is currently on sliding scale insulin with night coverage. Also on carb modified diet - we'll continue to monitor blood sugars    Hypertension - patient is currently on metoprolol,  Losartan, as well as amlodipine. Blood pressure medication was not held on admission. Given suspicion for stroke currently on hold. Currently blood pressure wnl. If blood pressure rises will restart some of patient's medications.    Hyperlipidemia - patient is currently on statin will plan on continuing  Code Status: full Family Communication: discussed with patient and spouse at bedside Disposition Plan: pending final recommendations from specialist involved   Consultants:  Neurology  Vascular surgery  Procedures:  Please refer to chart and or Neuro recommendations  Antibiotics:  None  HPI/Subjective: Pt has no new complaints today. No acute issues reported overnight.  Objective: Filed Vitals:   06/21/15 0907 06/21/15 1412  BP: 123/82 126/66  Pulse: 76 65  Temp: 97.8 F (36.6 C) 98.1 F (36.7 C)  Resp: 20 20    Intake/Output Summary (Last 24 hours) at 06/21/15 1556 Last data filed at 06/21/15 1200  Gross per 24 hour  Intake    960 ml  Output      0 ml  Net    960 ml   Filed Weights   06/18/15 1518 06/18/15 2140  Weight: 100.245 kg (221 lb) 105.5 kg (232 lb 9.4 oz)    Exam:   General:  Pt in nad,  alert and awake  Cardiovascular: rrr, no mrg  Respiratory: cta bl, no wheezes  Abdomen: soft, nt, nd  Musculoskeletal: no cyanosis or clubbing  Data Reviewed: Basic Metabolic Panel:  Recent Labs Lab 06/18/15 1546 06/18/15 1609 06/18/15 1817 06/19/15 0625  NA 140 141 138 139  K 4.4 4.3 5.5* 4.0  CL 105 101 102 105  CO2 26  --   --  27  GLUCOSE 105* 100* 85 105*  BUN 22* 26* 31* 17  CREATININE 1.04 1.00 0.80 0.84  CALCIUM 9.4  --   --  8.8*   Liver Function Tests:  Recent Labs Lab 06/18/15 1546  AST 21  ALT 15*  ALKPHOS 66  BILITOT 0.9  PROT 7.0  ALBUMIN 4.2   No results for input(s): LIPASE, AMYLASE in the last 168 hours. No results for input(s): AMMONIA in the last 168 hours. CBC:  Recent Labs Lab 06/18/15 1546 06/18/15 1609 06/18/15 1817 06/19/15 0625  WBC 4.8  --   --  4.5  NEUTROABS 2.9  --   --   --   HGB 13.6 15.3 15.0 13.2  HCT 41.7 45.0 44.0 40.4  MCV 86.7  --   --  86.9  PLT 195  --   --  190   Cardiac Enzymes: No results for input(s): CKTOTAL, CKMB, CKMBINDEX, TROPONINI in the last 168 hours. BNP (last 3 results) No results for input(s): BNP in the last 8760 hours.  ProBNP (last 3 results) No results for input(s): PROBNP in the last 8760 hours.  CBG:  Recent Labs Lab 06/20/15 1148 06/20/15 1610 06/20/15 2253 06/21/15 0614 06/21/15 1134  GLUCAP 88 88 113* 99 78    No results found for this or any previous visit (from the past 240 hour(s)).   Studies: Ct Angio Neck W/cm &/or Wo/cm  06/20/2015  CLINICAL DATA:  LEFT arm numbness, hypertensive emergency. Assess for carotid stenosis. History of hypertension, hyperlipidemia, tobacco abuse, prostate cancer. EXAM: CT ANGIOGRAPHY NECK TECHNIQUE: Multidetector CT imaging of the neck was performed using the standard protocol during bolus administration of intravenous contrast. Multiplanar CT image reconstructions and MIPs were obtained to evaluate the vascular anatomy. Carotid stenosis  measurements (when applicable) are obtained utilizing NASCET criteria, using the distal internal carotid diameter as the denominator. CONTRAST:  165mL OMNIPAQUE IOHEXOL 350 MG/ML SOLN COMPARISON:  MRI/MRA  head June 19, 2015 FINDINGS: Aortic arch: Normal appearance of the thoracic arch, normal branch pattern. Moderate calcific atherosclerosis of the arch vessel origins. The origins of the innominate, left Common carotid artery and subclavian artery are widely patent. Right carotid system: Common carotid artery is widely patent, coursing in a straight line fashion. Eccentric calcific atherosclerosis resulting in less than 1 cm segment of approximate least 65% stenosis by NASCET criteria, within 13 mm of the origin. Normal appearance of the the distal RIGHT cervical internal carotid artery. Left carotid system: Common carotid artery is widely patent, coursing in a straight line fashion. Severe eccentric calcific atherosclerosis resulting in less than 50% stenosis LEFT internal carotid artery origin. Normal appearance of the included internal carotid artery. Vertebral arteries:Mild stenosis LEFT vertebral artery origin which arises directly from the aortic arch. RIGHT vertebral artery origin is widely patent. Bilateral vertebral arteries are patent with extrinsic deformity due to degenerative cervical spine. Skeleton: No acute osseous process though bone windows have not been submitted. Status post median sternotomy. Other neck: Soft tissues of the neck are nonacute though, not tailored for evaluation. Poor dentition, multiple dental caries with periapical lucency/abscess. IMPRESSION: Atherosclerosis results in approximately 65% stenosis proximal RIGHT internal carotid artery. Less than 50% stenosis LEFT internal carotid artery origin. Mild stenosis of LEFT vertebral artery origin. Electronically Signed   By: Elon Alas M.D.   On: 06/20/2015 23:27    Scheduled Meds: . atorvastatin  20 mg Oral Daily  .  clopidogrel  75 mg Oral Daily  . enoxaparin (LOVENOX) injection  40 mg Subcutaneous Q24H  . insulin aspart  0-5 Units Subcutaneous QHS  . insulin aspart  0-9 Units Subcutaneous TID WC  . sodium chloride flush  3 mL Intravenous Q12H   Continuous Infusions:   Time spent: > 35 minutes  [name]  [facility] Pager [phone] If 7PM-7AM, please contact night-coverage at [email or web address], password [facility] 06/21/2015, 3:56 PM  LOS: 2 days

## 2015-06-21 NOTE — Consult Note (Signed)
[facility] NOTE   MRN : [id number]  Reason for Consult: carotid stenosis Referring Physician: [name], MD  History of Present Illness: 70 y/o male Reports to the ED with a CC of left arm numbness with two episodes.  The first episode was just tingling sensation.  The second episode the next day was weakness and un controlled left arm movements.  He is right handed.  CAD, HTN managed with lopressor, DM2 managed with metformin, hyperlipidemia managed with lipitor.  He has been admitted for stroke work up.       Current Facility-Administered Medications  Medication Dose Route Frequency Provider Last Rate Last Dose  . 0.9 %  sodium chloride infusion  250 mL Intravenous PRN Theressa Millard, MD      . acetaminophen (TYLENOL) tablet 650 mg  650 mg Oral Q6H PRN Theressa Millard, MD       Or  . acetaminophen (TYLENOL) suppository 650 mg  650 mg Rectal Q6H PRN Theressa Millard, MD      . alum & mag hydroxide-simeth (MAALOX/MYLANTA) 200-200-20 MG/5ML suspension 30 mL  30 mL Oral Q6H PRN Theressa Millard, MD      . atorvastatin (LIPITOR) tablet 20 mg  20 mg Oral Daily Theressa Millard, MD   20 mg at 06/21/15 0858  . clopidogrel (PLAVIX) tablet 75 mg  75 mg Oral Daily [name], MD   75 mg at 06/21/15 0900  . enoxaparin (LOVENOX) injection 40 mg  40 mg Subcutaneous Q24H Theressa Millard, MD   40 mg at 06/21/15 0858  . HYDROmorphone (DILAUDID) injection 0.5-1 mg  0.5-1 mg Intravenous Q3H PRN Theressa Millard, MD      . insulin aspart (novoLOG) injection 0-5 Units  0-5 Units Subcutaneous QHS Theressa Millard, MD   0 Units at 06/18/15 2331  . insulin aspart (novoLOG) injection 0-9 Units  0-9 Units Subcutaneous TID WC Theressa Millard, MD   0 Units at 06/19/15 [phone]  . ondansetron (ZOFRAN) tablet 4 mg  4 mg Oral Q6H PRN Theressa Millard, MD       Or  . ondansetron (ZOFRAN) injection 4 mg  4 mg Intravenous Q6H PRN Harvette Evonnie Dawes, MD       . oxyCODONE (Oxy IR/ROXICODONE) immediate release tablet 5 mg  5 mg Oral Q4H PRN Theressa Millard, MD      . senna-docusate (Senokot-S) tablet 1 tablet  1 tablet Oral QHS PRN Theressa Millard, MD      . sodium chloride flush (NS) 0.9 % injection 3 mL  3 mL Intravenous Q12H Theressa Millard, MD   3 mL at 06/21/15 0859  . sodium chloride flush (NS) 0.9 % injection 3 mL  3 mL Intravenous PRN Theressa Millard, MD        Pt meds include: Statin :Yes Betablocker: Yes ASA: yes Other anticoagulants/antiplatelets: plavix  Past Medical History  Diagnosis Date  . ASCVD (arteriosclerotic cardiovascular disease)     03/2004: DES x2 to the RCA; IMI with stent occlusion in 02/2005- suboptimal Plavix compliance  . Hypertension   . Tobacco abuse     stopped smoking 06/28/09  . Hyperlipidemia   . Morbid obesity ([place])   . Mild depression   . Carcinoma of prostate ([place])     Clopidogrel held for biopsy  . CAD (coronary artery disease)   . Diabetes mellitus without complication ([place])   . Constipation due to pain  medication   . Diabetic neuropathy [facility])     Past Surgical History  Procedure Laterality Date  . Lumbar spine surgery  2002  . Vasectomy    . Prostatectomy  02/2007  . 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 Cardiac catheterization      X 1 stent before having CABG  . Lumbar laminectomy/decompression microdiscectomy N/A 09/06/2014    Procedure: LUMBER DECOMPRESSION L3-5;  Surgeon: Melina Schools, MD;  Location: [facility];  Service: Orthopedics;  Laterality: N/A;    Social History Social History  Substance Use Topics  . Smoking status: Former Smoker    Types: Cigarettes  . Smokeless tobacco: Former Systems developer    Quit date: 06/28/2009  . Alcohol Use: 1.2 oz/week    2 Glasses of wine per week     Comment: Occasionally    Family History Family History  Problem Relation Age of Onset  . Kidney disease Mother   . Colon cancer Neg Hx     No Known Allergies   REVIEW  OF SYSTEMS  General: [ ]  Weight loss, [ ]  Fever, [ ]  chills Neurologic: [ ]  Dizziness, [ ]  Blackouts, [ ]  Seizure [ ]  Stroke, [ ]  "Mini stroke", [ ]  Slurred speech, [ ]  Temporary blindness; [ ]  weakness in arms or legs, [ ]  Hoarseness [ ]  Dysphagia Cardiac: [ ]  Chest pain/pressure, [ ]  Shortness of breath at rest [ ]  Shortness of breath with exertion, [ ]  Atrial fibrillation or irregular heartbeat  Vascular: [ ]  Pain in legs with walking, [ ]  Pain in legs at rest, [ ]  Pain in legs at night,  [ ]  Non-healing ulcer, [ ]  Blood clot in vein/DVT,   Pulmonary: [ ]  Home oxygen, [ ]  Productive cough, [ ]  Coughing up blood, [ ]  Asthma,  [ ]  Wheezing [ ]  COPD Musculoskeletal:  [ ]  Arthritis, [ ]  Low back pain, [ ]  Joint pain Hematologic: [ ]  Easy Bruising, [ ]  Anemia; [ ]  Hepatitis Gastrointestinal: [ ]  Blood in stool, [ ]  Gastroesophageal Reflux/heartburn, Urinary: [ ]  chronic Kidney disease, [ ]  on HD - [ ]  MWF or [ ]  TTHS, [ ]  Burning with urination, [ ]  Difficulty urinating Skin: [ ]  Rashes, [ ]  Wounds Psychological: [ ]  Anxiety, [ ]  Depression  Physical Examination Filed Vitals:   06/20/15 2314 06/21/15 0157 06/21/15 0529 06/21/15 0907  BP: 137/67 144/86 140/76 123/82  Pulse: 66 65 73 76  Temp: 97.9 F (36.6 C) 97.7 F (36.5 C) 97.8 F (36.6 C) 97.8 F (36.6 C)  TempSrc: Oral Oral Oral Oral  Resp: 18 18 18 20   Height:      Weight:      SpO2: 94% 93% 95% 94%   Body mass index is 29.85 kg/(m^2).  General:  WDWN in NAD Gait: Normal HENT: WNL Eyes: Pupils equal Pulmonary: normal non-labored breathing , without Rales, rhonchi,  wheezing Cardiac: RRR, without  Murmurs, rubs or gallops; No carotid bruits Abdomen: soft, NT, no masses Skin: no rashes, ulcers noted;  no Gangrene , no cellulitis; no open wounds;   Vascular Exam/Pulses: radial, femoral dp/PT palapable pulses.   Musculoskeletal: no muscle wasting or atrophy; no edema  Neurologic: A&O X 3; Appropriate Affect ;   SENSATION: normal; MOTOR FUNCTION: 5/5 Symmetric Speech is fluent/normal   Significant Diagnostic Studies: CBC Lab Results  Component Value Date   WBC 4.5 06/19/2015   HGB 13.2 06/19/2015   HCT 40.4 06/19/2015   MCV  86.9 06/19/2015   PLT 190 06/19/2015    BMET    Component Value Date/Time   NA 139 06/19/2015 0625   K 4.0 06/19/2015 0625   CL 105 06/19/2015 0625   CO2 27 06/19/2015 0625   GLUCOSE 105* 06/19/2015 0625   BUN 17 06/19/2015 0625   CREATININE 0.84 06/19/2015 0625   CALCIUM 8.8* 06/19/2015 0625   GFRNONAA >60 06/19/2015 0625   GFRAA >60 06/19/2015 [id number]   Estimated Creatinine Clearance: 107.4 mL/min (by C-G formula based on Cr of 0.84).  COAG Lab Results  Component Value Date   INR 1.06 06/18/2015   INR 0.9 02/16/2007     Non-Invasive Vascular Imaging: ]  Ct Head Wo Contrast 06/18/2015 IMPRESSION: Study within normal limits.   Mri brain and Mra head Wo Contrast 06/19/2015 IMPRESSION: 1. Punctate acute infarct in the right parietal cortex. 2. No prior infarct or white matter disease. 3. Negative intracranial MRA.   CUS Done 04/2015 by Dr. Einar Gip - right ICA 16-49% stenosis   IMPRESSION: Atherosclerosis results in approximately 65% stenosis proximal RIGHT internal carotid artery.  Less than 50% stenosis LEFT internal carotid artery origin.  Mild stenosis of LEFT vertebral artery origin.  Study Conclusions  - Left ventricle: The cavity size was normal. Wall thickness was normal. Systolic function was normal. The estimated ejection fraction was in the range of 50% to 55%. Wall motion was normal; there were no regional wall motion abnormalities. Features are consistent with a pseudonormal left ventricular filling pattern, with concomitant abnormal relaxation and increased filling pressure (grade 2 diastolic dysfunction). - Aortic valve: Mildly calcified annulus. Moderately thickened, mildly calcified leaflets. - Left atrium: The  atrium was mildly dilated.  ASSESSMENT/PLAN:  Punctate acute infarct in the right parietal cortex.   Right carotid artery stenosis 65 %  Pending further cardiac work up.  Recommend 30 day cardiac event monitoring to rule out afib per neurology. Started on both plavix and aspirin since being admitting.  Will discuss the possibility for CEA on the right in the near future.  Please wait until Dr. Trula Slade has seen the patient before D/C.   Laurence Slate [facility] 06/21/2015 2:12 PM   I agree with the above, I have seen and examined the patient.  He was admitted on 2-28 following 2 episodes of left arm weakness, lasting for about 1 hour.   His initial head CT was negative, but MRi of the brain showed an acute infarct in the right parietal cortex.  He did not receive TPA, as his symptoms have resolved.  He had carotid dopplers in Dr. Irven Shelling office which showed 1-49% right carotid stenosis in January of 2017.  His CTA recently revealed approximately 65% right carotid stenosis.  He underwent an echo which did not show thrombus.  He is scheduled for 30 day Holter monitor.  Smoking cessation occurred in 3/11.  He is on Lipitor for hypercholesterolemia.  He has type 2 diabetes, which is fairly well controlled with a HbA1c of 6.2.    I discussed 2 options with patient.  #1 would be carotid angiogram to further evaluate the degree of his stenosis, since he has a doppler study revealing <50% stenosis and  A CTA revealing >65% stenosis.  If confirmed to have >50% stenosis, he would need carotid revascularization.  #2 would be to proceed with right CEA, given his most recent CTA showed >50% right carotid stenosis.  After a discussion, we have decided to proceed with right CEA.  I discussed the  risks of surgery, including the risk of stroke and nerve injury.  We also discussed the risk of bleeding, given that he is now on Plavix, which I would not stop.  My office will contact him on Monday with a date and time for his  right CEA within the next 2 weeks.  He is cleared from my perspective for discharge.  Annamarie Major

## 2015-06-22 DIAGNOSIS — I2510 Atherosclerotic heart disease of native coronary artery without angina pectoris: Secondary | ICD-10-CM

## 2015-06-22 DIAGNOSIS — I6529 Occlusion and stenosis of unspecified carotid artery: Secondary | ICD-10-CM

## 2015-06-22 LAB — GLUCOSE, CAPILLARY: Glucose-Capillary: 106 mg/dL — ABNORMAL HIGH (ref 65–99)

## 2015-06-22 MED ORDER — CLOPIDOGREL BISULFATE 75 MG PO TABS: 75.0000 mg | ORAL_TABLET | Freq: Every day | ORAL | Status: DC

## 2015-06-22 NOTE — Progress Notes (Signed)
Patient is being d/c home. D/c instructions given and patient verbalized understanding. 

## 2015-06-22 NOTE — Discharge Summary (Signed)
Physician Discharge Summary  QADIR KAPPLE [id number] DOB: 1945-11-09 DOA: 06/18/2015  PCP: Antony Blackbird, MD  Admit date: 06/18/2015 Discharge date: 06/22/2015  Time spent: > 35 minutes  Recommendations for Outpatient Follow-up:  1. Please ensure Holter Monitor is obtained post discharge. I have made a call out to the Cardiology group here at [facility] and left his contact information. 2. Patient is to follow up with Neurology and Vascular surgery 3. Monitor blood pressures and consider continuation of some of his blood pressure medications pending blood pressure results   Discharge Diagnoses:  Principal Problem:   TIA (transient ischemic attack) Active Problems:   TOBACCO ABUSE   ASCVD (arteriosclerotic cardiovascular disease)   Diabetes mellitus without complication (HCC)   Hypertension   Hyperlipidemia   Acute CVA (cerebrovascular accident) ([place])   Carotid stenosis   Discharge Condition: stable  Diet recommendation: Heart healthy/carb modified/low sodium  Filed Weights   06/18/15 1518 06/18/15 2140  Weight: 100.245 kg (221 lb) 105.5 kg (232 lb 9.4 oz)    History of present illness:  Pt is a 70 year old male with history of ASCVD, HTN, DM2, HPL, who presented to the ED due to 2 episodes of left arm numbness and weakness. Patient diagnosed with acute punctate infarct in the right parietal cortex.  Hospital Course:   Principal Problem: left arm numbness/strokelike symptoms - Neurology consulted and will follow as outpatient for further recommendations - Holter monitor for the next 30 days - MRI of brain reported punctate acute infarct in the right parietal cortex - No needs identified by PT and they have signed off - Pt d/c on plavix and aspirin - Continue statin - blood pressures relatively well controlled off antihypertensives. Patient is to continue to monitor his blood pressures and if his blood pressure rises he is to contact his primary care physician in  continuation of his blood pressure medication. Otherwise plan will be for patient to continue to eat a low-salt diet - Vascular surgery consulted and plan is for further imaging studies to assess stenosis. The Monday after hospital discharge vascular surgery team will reach out to the patient to schedule follow-up appointment.  Active Problems:  TOBACCO ABUSE - will recommend cessation   Diabetes mellitus without complication ([place]) - patient is currently on sliding scale insulin with night coverage. Also on carb modified diet - we'll continue to monitor blood sugars   Hypertension - Blood pressures relatively well controlled off antihypertensives. We'll recommend a low-salt diet and I have recommended he continue to monitor his blood pressures after discharge. Should the values be persistently elevated he is to contact his primary care physician so that they can consider restarting some of his blood pressure medications   Hyperlipidemia - patient is currently on statin will plan on continuing on hospital discharge   Procedures:  None  Consultations:  Vascular surgery  Neurology  Discharge Exam: Filed Vitals:   06/22/15 0517 06/22/15 0924  BP: 132/77 129/72  Pulse: 71 73  Temp: 98.2 F (36.8 C) 98.2 F (36.8 C)  Resp: 18 18    General: Pt in nad, alert and awake Cardiovascular: rrr, no mrg Respiratory: cta bl, no wheezes  Discharge Instructions   Discharge Instructions    Ambulatory referral to Neurology    Complete by:  As directed   Dr. Erlinda Hong requests followup in 2 months     Call MD for:  redness, tenderness, or signs of infection (pain, swelling, redness, odor or green/yellow discharge around incision site)  Complete by:  As directed      Call MD for:  severe uncontrolled pain    Complete by:  As directed      Call MD for:  temperature >100.4    Complete by:  As directed      Diet - low sodium heart healthy    Complete by:  As directed      Discharge  instructions    Complete by:  As directed   Please follow up with Neurology and vascular surgeon after discharge.     Increase activity slowly    Complete by:  As directed           Current Discharge Medication List    START taking these medications   Details  clopidogrel (PLAVIX) 75 MG tablet Take 1 tablet (75 mg total) by mouth daily. Qty: 30 tablet, Refills: 0      CONTINUE these medications which have NOT CHANGED   Details  aspirin EC 81 MG tablet Take 81 mg by mouth daily.    atorvastatin (LIPITOR) 20 MG tablet Take 20 mg by mouth daily.    metFORMIN (GLUMETZA) 1000 MG (MOD) 24 hr tablet Take 1,000 mg by mouth daily with breakfast.      STOP taking these medications     amLODipine (NORVASC) 10 MG tablet      hydrochlorothiazide (HYDRODIURIL) 25 MG tablet      losartan (COZAAR) 100 MG tablet      metoprolol tartrate (LOPRESSOR) 25 MG tablet        No Known Allergies Follow-up Information    Follow up with Xu,Jindong, MD In 2 months.   Specialty:  Neurology   Why:  Stroke Clinic, Office will call you with appointment date & time   Contact information:   [address] [place] [address] [phone]        The results of significant diagnostics from this hospitalization (including imaging, microbiology, ancillary and laboratory) are listed below for reference.    Significant Diagnostic Studies: Ct Head Wo Contrast  06/18/2015  CLINICAL DATA:  Acute onset left-sided weakness hypertension EXAM: CT HEAD WITHOUT CONTRAST TECHNIQUE: Contiguous axial images were obtained from the base of the skull through the vertex without intravenous contrast. COMPARISON:  None. FINDINGS: The ventricles are normal in size and configuration. There is no intracranial mass, hemorrhage, extra-axial fluid collection, or midline shift. Gray-white compartments appear normal. No acute infarct evident. Bony calvarium appears intact. The mastoid air cells are clear. No  intraorbital lesions apparent. IMPRESSION: Study within normal limits. Electronically Signed   By: Lowella Grip III M.D.   On: 06/18/2015 16:26   Ct Angio Neck W/cm &/or Wo/cm  06/20/2015  CLINICAL DATA:  LEFT arm numbness, hypertensive emergency. Assess for carotid stenosis. History of hypertension, hyperlipidemia, tobacco abuse, prostate cancer. EXAM: CT ANGIOGRAPHY NECK TECHNIQUE: Multidetector CT imaging of the neck was performed using the standard protocol during bolus administration of intravenous contrast. Multiplanar CT image reconstructions and MIPs were obtained to evaluate the vascular anatomy. Carotid stenosis measurements (when applicable) are obtained utilizing NASCET criteria, using the distal internal carotid diameter as the denominator. CONTRAST:  166mL OMNIPAQUE IOHEXOL 350 MG/ML SOLN COMPARISON:  MRI/MRA  head June 19, 2015 FINDINGS: Aortic arch: Normal appearance of the thoracic arch, normal branch pattern. Moderate calcific atherosclerosis of the arch vessel origins. The origins of the innominate, left Common carotid artery and subclavian artery are widely patent. Right carotid system: Common carotid artery is widely patent,  coursing in a straight line fashion. Eccentric calcific atherosclerosis resulting in less than 1 cm segment of approximate least 65% stenosis by NASCET criteria, within 13 mm of the origin. Normal appearance of the the distal RIGHT cervical internal carotid artery. Left carotid system: Common carotid artery is widely patent, coursing in a straight line fashion. Severe eccentric calcific atherosclerosis resulting in less than 50% stenosis LEFT internal carotid artery origin. Normal appearance of the included internal carotid artery. Vertebral arteries:Mild stenosis LEFT vertebral artery origin which arises directly from the aortic arch. RIGHT vertebral artery origin is widely patent. Bilateral vertebral arteries are patent with extrinsic deformity due to degenerative  cervical spine. Skeleton: No acute osseous process though bone windows have not been submitted. Status post median sternotomy. Other neck: Soft tissues of the neck are nonacute though, not tailored for evaluation. Poor dentition, multiple dental caries with periapical lucency/abscess. IMPRESSION: Atherosclerosis results in approximately 65% stenosis proximal RIGHT internal carotid artery. Less than 50% stenosis LEFT internal carotid artery origin. Mild stenosis of LEFT vertebral artery origin. Electronically Signed   By: Elon Alas M.D.   On: 06/20/2015 23:27   Mr Brain Wo Contrast  06/19/2015  CLINICAL DATA:  Two transient episodes of left arm weakness yesterday and today. EXAM: MRI HEAD WITHOUT CONTRAST MRA HEAD WITHOUT CONTRAST TECHNIQUE: Multiplanar, multiecho pulse sequences of the brain and surrounding structures were obtained without intravenous contrast. Angiographic images of the head were obtained using MRA technique without contrast. COMPARISON:  Head CT from yesterday FINDINGS: MRI HEAD FINDINGS Calvarium and upper cervical spine: No focal marrow signal abnormality. Cervical facet arthropathy. Dermal inclusion cyst in left parietal scalp. Orbits: Negative. Sinuses and Mastoids: Clear. Brain: Punctate restricted diffusion in the right occipital cortex. No signs of hemorrhage. MRA HEAD FINDINGS Symmetric carotid arteries. Mild right vertebral artery dominance. Dominant left PICA and right AICA. Intact circle-of-Willis. No major branch occlusion or flow limiting stenosis. No vessel irregularity. Negative for aneurysm. Infundibulum at the bilateral posterior communicating arteries. IMPRESSION: 1. Punctate acute infarct in the right parietal cortex. 2. No prior infarct or white matter disease. 3. Negative intracranial MRA. Electronically Signed   By: Monte Fantasia M.D.   On: 06/19/2015 15:37   Mr Jodene Nam Head/brain Wo Cm  06/19/2015  CLINICAL DATA:  Two transient episodes of left arm weakness  yesterday and today. EXAM: MRI HEAD WITHOUT CONTRAST MRA HEAD WITHOUT CONTRAST TECHNIQUE: Multiplanar, multiecho pulse sequences of the brain and surrounding structures were obtained without intravenous contrast. Angiographic images of the head were obtained using MRA technique without contrast. COMPARISON:  Head CT from yesterday FINDINGS: MRI HEAD FINDINGS Calvarium and upper cervical spine: No focal marrow signal abnormality. Cervical facet arthropathy. Dermal inclusion cyst in left parietal scalp. Orbits: Negative. Sinuses and Mastoids: Clear. Brain: Punctate restricted diffusion in the right occipital cortex. No signs of hemorrhage. MRA HEAD FINDINGS Symmetric carotid arteries. Mild right vertebral artery dominance. Dominant left PICA and right AICA. Intact circle-of-Willis. No major branch occlusion or flow limiting stenosis. No vessel irregularity. Negative for aneurysm. Infundibulum at the bilateral posterior communicating arteries. IMPRESSION: 1. Punctate acute infarct in the right parietal cortex. 2. No prior infarct or white matter disease. 3. Negative intracranial MRA. Electronically Signed   By: Monte Fantasia M.D.   On: 06/19/2015 15:37    Microbiology: No results found for this or any previous visit (from the past 240 hour(s)).   Labs: Basic Metabolic Panel:  Recent Labs Lab 06/18/15 1546 06/18/15 1609 06/18/15 1817 06/19/15 [id number]  NA 140 141 138 139  K 4.4 4.3 5.5* 4.0  CL 105 101 102 105  CO2 26  --   --  27  GLUCOSE 105* 100* 85 105*  BUN 22* 26* 31* 17  CREATININE 1.04 1.00 0.80 0.84  CALCIUM 9.4  --   --  8.8*   Liver Function Tests:  Recent Labs Lab 06/18/15 1546  AST 21  ALT 15*  ALKPHOS 66  BILITOT 0.9  PROT 7.0  ALBUMIN 4.2   No results for input(s): LIPASE, AMYLASE in the last 168 hours. No results for input(s): AMMONIA in the last 168 hours. CBC:  Recent Labs Lab 06/18/15 1546 06/18/15 1609 06/18/15 1817 06/19/15 0625  WBC 4.8  --   --  4.5   NEUTROABS 2.9  --   --   --   HGB 13.6 15.3 15.0 13.2  HCT 41.7 45.0 44.0 40.4  MCV 86.7  --   --  86.9  PLT 195  --   --  190   Cardiac Enzymes: No results for input(s): CKTOTAL, CKMB, CKMBINDEX, TROPONINI in the last 168 hours. BNP: BNP (last 3 results) No results for input(s): BNP in the last 8760 hours.  ProBNP (last 3 results) No results for input(s): PROBNP in the last 8760 hours.  CBG:  Recent Labs Lab 06/21/15 0614 06/21/15 1134 06/21/15 1621 06/21/15 2212 06/22/15 0641  GLUCAP 99 78 86 103* 106*       Signed:  [name] MD.  [facility] 06/22/2015, 9:44 AM

## 2015-06-24 ENCOUNTER — Other Ambulatory Visit: Payer: Self-pay

## 2015-07-01 ENCOUNTER — Encounter (HOSPITAL_COMMUNITY)
Admission: RE | Admit: 2015-07-01 | Discharge: 2015-07-01 | Disposition: A | Discharge status: Home / Self Care | Payer: Medicare Other | Source: Ambulatory Visit | Attending: Surgery | Admitting: Surgery

## 2015-07-01 ENCOUNTER — Encounter (HOSPITAL_COMMUNITY): Payer: Self-pay

## 2015-07-01 HISTORY — DX: Acute myocardial infarction, unspecified: I21.9

## 2015-07-01 HISTORY — DX: Anxiety disorder, unspecified: F41.9

## 2015-07-01 HISTORY — DX: Reserved for inherently not codable concepts without codable children: IMO0001

## 2015-07-01 LAB — URINALYSIS, ROUTINE W REFLEX MICROSCOPIC
Bilirubin Urine: NEGATIVE
Glucose, UA: NEGATIVE mg/dL
Hgb urine dipstick: NEGATIVE
Ketones, ur: NEGATIVE mg/dL
Leukocytes, UA: NEGATIVE
Nitrite: NEGATIVE
Protein, ur: NEGATIVE mg/dL
Specific Gravity, Urine: 1.027 (ref 1.005–1.030)
pH: 5.5 (ref 5.0–8.0)

## 2015-07-01 LAB — PROTIME-INR
INR: 1.04 (ref 0.00–1.49)
Prothrombin Time: 13.8 seconds (ref 11.6–15.2)

## 2015-07-01 LAB — CBC
HCT: 41 % (ref 39.0–52.0)
Hemoglobin: 13.3 g/dL (ref 13.0–17.0)
MCH: 28.3 pg (ref 26.0–34.0)
MCHC: 32.4 g/dL (ref 30.0–36.0)
MCV: 87.2 fL (ref 78.0–100.0)
Platelets: 199 10*3/uL (ref 150–400)
RBC: 4.7 MIL/uL (ref 4.22–5.81)
RDW: 12.9 % (ref 11.5–15.5)
WBC: 5.1 10*3/uL (ref 4.0–10.5)

## 2015-07-01 LAB — TYPE AND SCREEN
ABO/RH(D): A POS
Antibody Screen: NEGATIVE

## 2015-07-01 LAB — COMPREHENSIVE METABOLIC PANEL
ALT: 15 U/L — ABNORMAL LOW (ref 17–63)
AST: 20 U/L (ref 15–41)
Albumin: 4 g/dL (ref 3.5–5.0)
Alkaline Phosphatase: 71 U/L (ref 38–126)
Anion gap: 18 — ABNORMAL HIGH (ref 5–15)
BUN: 14 mg/dL (ref 6–20)
CO2: 30 mmol/L (ref 22–32)
Calcium: 10.8 mg/dL — ABNORMAL HIGH (ref 8.9–10.3)
Chloride: 98 mmol/L — ABNORMAL LOW (ref 101–111)
Creatinine, Ser: 0.99 mg/dL (ref 0.61–1.24)
GFR calc Af Amer: >60 mL/min (ref >60)
GFR calc non Af Amer: >60 mL/min (ref >60)
Glucose, Bld: 90 mg/dL (ref 65–99)
Potassium: 4.3 mmol/L (ref 3.5–5.1)
Sodium: 146 mmol/L — ABNORMAL HIGH (ref 135–145)
Total Bilirubin: 1.3 mg/dL — ABNORMAL HIGH (ref 0.3–1.2)
Total Protein: 7.1 g/dL (ref 6.5–8.1)

## 2015-07-01 LAB — GLUCOSE, CAPILLARY: Glucose-Capillary: 100 mg/dL — ABNORMAL HIGH (ref 65–99)

## 2015-07-01 LAB — APTT: aPTT: 30 seconds (ref 24–37)

## 2015-07-01 LAB — ABO/RH: ABO/RH(D): A POS

## 2015-07-01 LAB — SURGICAL PCR SCREEN
MRSA, PCR: NEGATIVE
Staphylococcus aureus: NEGATIVE

## 2015-07-01 MED ORDER — CHLORHEXIDINE GLUCONATE CLOTH 2 % EX PADS
6.0000 | MEDICATED_PAD | Freq: Once | CUTANEOUS | Status: DC
Start: 2015-07-01 — End: 2015-07-02

## 2015-07-01 MED ORDER — CHLORHEXIDINE GLUCONATE CLOTH 2 % EX PADS: 6.0000 | MEDICATED_PAD | Freq: Once | CUTANEOUS | Status: DC

## 2015-07-02 MED ORDER — DEXTROSE 5 % IV SOLN
1.5000 g | INTRAVENOUS | Status: AC
  Administered 2015-07-03: 1.5 g via INTRAVENOUS
  Filled 2015-07-02 (×2): qty 1.5

## 2015-07-02 MED ORDER — SODIUM CHLORIDE 0.9 % IV SOLN: INTRAVENOUS | Status: DC

## 2015-07-02 NOTE — Progress Notes (Signed)
Anesthesia Chart Review: Patient is a 70 year old male scheduled for right CEA on 07/03/15 by Dr. Trula Slade.  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  PCP is Dr. Antony Blackbird. Cardiologist is Dr. Einar Gip [facility] CV), last visit with Neldon Labella, AGNP 1/11/7. Neurologist is Dr. Erlinda Hong.  Meds include ASA 81 mg, Lipitor, Plavix, metformin. Dr. Trula Slade is having patient continue ASA and Plavix perioperatively.   06/20/15 Echo: Study Conclusions - Left ventricle: The cavity size was normal. Wall thickness was  normal. Systolic function was normal. The estimated ejection  fraction was in the range of 50% to 55%. Wall motion was normal;  there were no regional wall motion abnormalities. Features are  consistent with a pseudonormal left ventricular filling pattern,  with concomitant abnormal relaxation and increased filling  pressure (grade 2 diastolic dysfunction). - Aortic valve: Mildly calcified annulus. Moderately thickened,  mildly calcified leaflets. - Left atrium: The atrium was mildly dilated.  08/31/14 Nuclear Stress Test [facility] CV): Resting ECG: NSR, RBBB. Stress ECG non-diagnostic for ischemia. Stress symptoms included dyspnea, dizziness. The perfusion imaging study demonstrates a moderate sized scar in the inferior, inferolateral and inferoapical, apical anterior region with mild peri-infarct ischemia espeically involving the lateral wall. THe LV systolic function was mildly depressed at 46% with inferior wall hypokinesis. This represents an Intermediate risk. Clinical correlation recommended. No change since prior study (report  comparison) from 11//15/05.  (This test was done as part of his pre-operative cardiology evaluation prior to back surgery 08/2014. He was ultimately cleared for that surgery due to stable stress test findings since 2005, good exercise tolerance and normal echo.)  06/18/15 EKG: SR, right BBB, LAD, LVH , inferior infarct (old). Overall, I think his EKG appears stable when compared to tracing from 08/23/14 (scanned under Media tab, Correspondence, 09/06/14 Encounter).  30 day Holter monitor pending. (Ordered as part of his CVA work-up.)  04/23/15 Carotid U/S [facility] CV; scanned under Media tab, Correspondence 06/18/15 Encounter): Conclusions: 16-49% RICA stenosis < 50% BECA stenosis Antegrade bilateral vertebral artery flow.   06/20/15 CTA neck: IMPRESSION: - Atherosclerosis results in approximately 65% stenosis proximal RIGHT internal carotid artery. - Less than 50% stenosis LEFT internal carotid artery origin. - Mild stenosis of LEFT vertebral artery origin.  06/19/15 MRI/MRA brain: IMPRESSION: 1. Punctate acute infarct in the right parietal cortex. 2. No prior infarct or white matter disease. 3. Negative intracranial MRA.  Preoperative labs noted. 06/19/15 A1c 6.2.   Patient had stable stress test last within the past year. He had cardiology follow-up within the past three months. He is now thought to have symptomatic carotid artery stenosis and R CEA recommended. He remains on anti-platelet therapy. If no acute changes then I anticipate that he can proceed as planned.    George Hugh [facility] Stay Center/Anesthesiology Phone [phone] 07/02/2015 11:03 AM

## 2015-07-03 ENCOUNTER — Inpatient Hospital Stay (HOSPITAL_COMMUNITY)
Admission: RE | Admit: 2015-07-03 | Discharge: 2015-07-05 | DRG: 039 | Disposition: A | Discharge status: Home / Self Care | Payer: Medicare Other | Source: Ambulatory Visit | Attending: Surgery | Admitting: Surgery

## 2015-07-03 ENCOUNTER — Encounter (HOSPITAL_COMMUNITY): Payer: Self-pay | Admitting: Surgery

## 2015-07-03 ENCOUNTER — Inpatient Hospital Stay (HOSPITAL_COMMUNITY): Payer: Medicare Other | Admitting: Vascular Surgery

## 2015-07-03 ENCOUNTER — Inpatient Hospital Stay (HOSPITAL_COMMUNITY): Payer: Medicare Other | Admitting: Certified Registered"

## 2015-07-03 ENCOUNTER — Encounter (HOSPITAL_COMMUNITY)
Admission: RE | Disposition: A | Discharge status: Home / Self Care | Payer: Self-pay | Source: Ambulatory Visit | Attending: Surgery

## 2015-07-03 DIAGNOSIS — I2510 Atherosclerotic heart disease of native coronary artery without angina pectoris: Secondary | ICD-10-CM | POA: Diagnosis present

## 2015-07-03 DIAGNOSIS — Z955 Presence of coronary angioplasty implant and graft: Secondary | ICD-10-CM | POA: Diagnosis not present

## 2015-07-03 DIAGNOSIS — K5903 Drug induced constipation: Secondary | ICD-10-CM | POA: Diagnosis present

## 2015-07-03 DIAGNOSIS — E7800 Pure hypercholesterolemia, unspecified: Secondary | ICD-10-CM | POA: Diagnosis present

## 2015-07-03 DIAGNOSIS — I739 Peripheral vascular disease, unspecified: Secondary | ICD-10-CM | POA: Diagnosis present

## 2015-07-03 DIAGNOSIS — F329 Major depressive disorder, single episode, unspecified: Secondary | ICD-10-CM | POA: Diagnosis present

## 2015-07-03 DIAGNOSIS — I6521 Occlusion and stenosis of right carotid artery: Principal | ICD-10-CM | POA: Diagnosis present

## 2015-07-03 DIAGNOSIS — Z8546 Personal history of malignant neoplasm of prostate: Secondary | ICD-10-CM | POA: Diagnosis not present

## 2015-07-03 DIAGNOSIS — F419 Anxiety disorder, unspecified: Secondary | ICD-10-CM | POA: Diagnosis present

## 2015-07-03 DIAGNOSIS — I10 Essential (primary) hypertension: Secondary | ICD-10-CM | POA: Diagnosis present

## 2015-07-03 DIAGNOSIS — Z87891 Personal history of nicotine dependence: Secondary | ICD-10-CM

## 2015-07-03 DIAGNOSIS — Z951 Presence of aortocoronary bypass graft: Secondary | ICD-10-CM | POA: Diagnosis not present

## 2015-07-03 DIAGNOSIS — R112 Nausea with vomiting, unspecified: Secondary | ICD-10-CM | POA: Diagnosis not present

## 2015-07-03 DIAGNOSIS — I6529 Occlusion and stenosis of unspecified carotid artery: Secondary | ICD-10-CM | POA: Diagnosis present

## 2015-07-03 DIAGNOSIS — Z8673 Personal history of transient ischemic attack (TIA), and cerebral infarction without residual deficits: Secondary | ICD-10-CM | POA: Diagnosis not present

## 2015-07-03 DIAGNOSIS — E1140 Type 2 diabetes mellitus with diabetic neuropathy, unspecified: Secondary | ICD-10-CM | POA: Diagnosis present

## 2015-07-03 DIAGNOSIS — E785 Hyperlipidemia, unspecified: Secondary | ICD-10-CM | POA: Diagnosis present

## 2015-07-03 HISTORY — PX: ENDARTERECTOMY: SHX5162

## 2015-07-03 LAB — GLUCOSE, CAPILLARY
Glucose-Capillary: 98 mg/dL (ref 65–99)
Glucose-Capillary: 109 mg/dL — ABNORMAL HIGH (ref 65–99)
Glucose-Capillary: 95 mg/dL (ref 65–99)
Glucose-Capillary: 97 mg/dL (ref 65–99)

## 2015-07-03 LAB — CBC
HCT: 36.9 % — ABNORMAL LOW (ref 39.0–52.0)
Hemoglobin: 11.9 g/dL — ABNORMAL LOW (ref 13.0–17.0)
MCH: 28.3 pg (ref 26.0–34.0)
MCHC: 32.2 g/dL (ref 30.0–36.0)
MCV: 87.6 fL (ref 78.0–100.0)
Platelets: 162 10*3/uL (ref 150–400)
RBC: 4.21 MIL/uL — ABNORMAL LOW (ref 4.22–5.81)
RDW: 13.1 % (ref 11.5–15.5)
WBC: 7.1 10*3/uL (ref 4.0–10.5)

## 2015-07-03 LAB — CREATININE, SERUM
Creatinine, Ser: 0.84 mg/dL (ref 0.61–1.24)
GFR calc Af Amer: >60 mL/min (ref >60)
GFR calc non Af Amer: >60 mL/min (ref >60)

## 2015-07-03 SURGERY — ENDARTERECTOMY, CAROTID
Anesthesia: General | Site: Neck | Laterality: Right

## 2015-07-03 MED ORDER — LIDOCAINE HCL (CARDIAC) 20 MG/ML IV SOLN
INTRAVENOUS | Status: AC
  Filled 2015-07-03: qty 5

## 2015-07-03 MED ORDER — PHENOL 1.4 % MT LIQD: 1.0000 | OROMUCOSAL | Status: DC | PRN

## 2015-07-03 MED ORDER — FENTANYL CITRATE (PF) 250 MCG/5ML IJ SOLN
INTRAMUSCULAR | Status: AC
  Filled 2015-07-03: qty 5

## 2015-07-03 MED ORDER — ACETAMINOPHEN 650 MG RE SUPP: 325.0000 mg | RECTAL | Status: DC | PRN

## 2015-07-03 MED ORDER — FENTANYL CITRATE (PF) 100 MCG/2ML IJ SOLN
25.0000 ug | INTRAMUSCULAR | Status: DC | PRN
  Administered 2015-07-03 (×3): 50 ug via INTRAVENOUS

## 2015-07-03 MED ORDER — ACETAMINOPHEN 325 MG PO TABS: 325.0000 mg | ORAL_TABLET | ORAL | Status: DC | PRN

## 2015-07-03 MED ORDER — ASPIRIN EC 81 MG PO TBEC
81.0000 mg | DELAYED_RELEASE_TABLET | Freq: Every day | ORAL | Status: DC
  Administered 2015-07-04 – 2015-07-05 (×2): 81 mg via ORAL
  Filled 2015-07-03 (×2): qty 1

## 2015-07-03 MED ORDER — POTASSIUM CHLORIDE CRYS ER 20 MEQ PO TBCR: 20.0000 meq | EXTENDED_RELEASE_TABLET | Freq: Every day | ORAL | Status: DC | PRN

## 2015-07-03 MED ORDER — PANTOPRAZOLE SODIUM 40 MG PO TBEC
40.0000 mg | DELAYED_RELEASE_TABLET | Freq: Every day | ORAL | Status: DC
  Administered 2015-07-04 – 2015-07-05 (×2): 40 mg via ORAL
  Filled 2015-07-03 (×2): qty 1

## 2015-07-03 MED ORDER — GLYCOPYRROLATE 0.2 MG/ML IJ SOLN
INTRAMUSCULAR | Status: DC | PRN
Start: 2015-07-03 — End: 2015-07-03
  Administered 2015-07-03: 0.2 mg via INTRAVENOUS

## 2015-07-03 MED ORDER — DOCUSATE SODIUM 100 MG PO CAPS
100.0000 mg | ORAL_CAPSULE | Freq: Every day | ORAL | Status: DC
  Administered 2015-07-04 – 2015-07-05 (×2): 100 mg via ORAL
  Filled 2015-07-03 (×2): qty 1

## 2015-07-03 MED ORDER — PROPOFOL 10 MG/ML IV BOLUS
INTRAVENOUS | Status: AC
  Filled 2015-07-03: qty 20

## 2015-07-03 MED ORDER — ALUM & MAG HYDROXIDE-SIMETH 200-200-20 MG/5ML PO SUSP: 15.0000 mL | ORAL | Status: DC | PRN

## 2015-07-03 MED ORDER — HYDRALAZINE HCL 20 MG/ML IJ SOLN
5.0000 mg | INTRAMUSCULAR | Status: DC | PRN
  Administered 2015-07-04: 5 mg via INTRAVENOUS
  Filled 2015-07-03: qty 1

## 2015-07-03 MED ORDER — CLOPIDOGREL BISULFATE 75 MG PO TABS
75.0000 mg | ORAL_TABLET | Freq: Every day | ORAL | Status: DC
  Administered 2015-07-04 – 2015-07-05 (×2): 75 mg via ORAL
  Filled 2015-07-03 (×2): qty 1

## 2015-07-03 MED ORDER — EPHEDRINE SULFATE 50 MG/ML IJ SOLN
INTRAMUSCULAR | Status: DC | PRN
  Administered 2015-07-03 (×3): 5 mg via INTRAVENOUS
  Administered 2015-07-03: 10 mg via INTRAVENOUS

## 2015-07-03 MED ORDER — PHENYLEPHRINE 40 MCG/ML (10ML) SYRINGE FOR IV PUSH (FOR BLOOD PRESSURE SUPPORT)
PREFILLED_SYRINGE | INTRAVENOUS | Status: AC
  Filled 2015-07-03: qty 10

## 2015-07-03 MED ORDER — FENTANYL CITRATE (PF) 100 MCG/2ML IJ SOLN
INTRAMUSCULAR | Status: AC
  Administered 2015-07-03: 50 ug via INTRAVENOUS
  Filled 2015-07-03: qty 2

## 2015-07-03 MED ORDER — MORPHINE SULFATE (PF) 2 MG/ML IV SOLN
1.0000 mg | INTRAVENOUS | Status: DC | PRN
  Administered 2015-07-03 (×2): 2 mg via INTRAVENOUS
  Filled 2015-07-03: qty 1

## 2015-07-03 MED ORDER — MIDAZOLAM HCL 2 MG/2ML IJ SOLN: 0.5000 mg | Freq: Once | INTRAMUSCULAR | Status: DC | PRN

## 2015-07-03 MED ORDER — ATORVASTATIN CALCIUM 20 MG PO TABS
20.0000 mg | ORAL_TABLET | Freq: Every day | ORAL | Status: DC
  Administered 2015-07-03 – 2015-07-04 (×2): 20 mg via ORAL
  Filled 2015-07-03 (×2): qty 1

## 2015-07-03 MED ORDER — HEMOSTATIC AGENTS (NO CHARGE) OPTIME
TOPICAL | Status: DC | PRN
  Administered 2015-07-03: 1 via TOPICAL

## 2015-07-03 MED ORDER — EPHEDRINE SULFATE 50 MG/ML IJ SOLN
INTRAMUSCULAR | Status: AC
  Filled 2015-07-03: qty 1

## 2015-07-03 MED ORDER — PROTAMINE SULFATE 10 MG/ML IV SOLN
INTRAVENOUS | Status: DC | PRN
  Administered 2015-07-03 (×2): 20 mg via INTRAVENOUS
  Administered 2015-07-03: 10 mg via INTRAVENOUS

## 2015-07-03 MED ORDER — SUGAMMADEX SODIUM 200 MG/2ML IV SOLN
INTRAVENOUS | Status: DC | PRN
  Administered 2015-07-03: 200 mg via INTRAVENOUS

## 2015-07-03 MED ORDER — METOPROLOL TARTRATE 1 MG/ML IV SOLN: 2.0000 mg | INTRAVENOUS | Status: DC | PRN

## 2015-07-03 MED ORDER — PROMETHAZINE HCL 25 MG/ML IJ SOLN: 6.2500 mg | INTRAMUSCULAR | Status: DC | PRN

## 2015-07-03 MED ORDER — GUAIFENESIN-DM 100-10 MG/5ML PO SYRP: 15.0000 mL | ORAL_SOLUTION | ORAL | Status: DC | PRN

## 2015-07-03 MED ORDER — FENTANYL CITRATE (PF) 100 MCG/2ML IJ SOLN
INTRAMUSCULAR | Status: AC
  Filled 2015-07-03: qty 2

## 2015-07-03 MED ORDER — OXYCODONE-ACETAMINOPHEN 5-325 MG PO TABS
1.0000 | ORAL_TABLET | ORAL | Status: DC | PRN
  Administered 2015-07-03: 2 via ORAL
  Administered 2015-07-04: 1 via ORAL
  Filled 2015-07-03: qty 1
  Filled 2015-07-03: qty 2

## 2015-07-03 MED ORDER — SODIUM CHLORIDE 0.9 % IJ SOLN
INTRAMUSCULAR | Status: AC
  Filled 2015-07-03: qty 10

## 2015-07-03 MED ORDER — OXYCODONE-ACETAMINOPHEN 5-325 MG PO TABS: 1.0000 | ORAL_TABLET | ORAL | Status: DC | PRN

## 2015-07-03 MED ORDER — METFORMIN HCL ER 500 MG PO TB24
1000.0000 mg | ORAL_TABLET | Freq: Every day | ORAL | Status: DC
  Administered 2015-07-04 – 2015-07-05 (×2): 1000 mg via ORAL
  Filled 2015-07-03 (×2): qty 2

## 2015-07-03 MED ORDER — MEPERIDINE HCL 25 MG/ML IJ SOLN: 6.2500 mg | INTRAMUSCULAR | Status: DC | PRN

## 2015-07-03 MED ORDER — INSULIN ASPART 100 UNIT/ML ~~LOC~~ SOLN
0.0000 [IU] | Freq: Three times a day (TID) | SUBCUTANEOUS | Status: DC
  Administered 2015-07-04: 2 [IU] via SUBCUTANEOUS

## 2015-07-03 MED ORDER — PROPOFOL 10 MG/ML IV BOLUS
INTRAVENOUS | Status: DC | PRN
  Administered 2015-07-03: 150 mg via INTRAVENOUS

## 2015-07-03 MED ORDER — HEPARIN SODIUM (PORCINE) 1000 UNIT/ML IJ SOLN
INTRAMUSCULAR | Status: DC | PRN
  Administered 2015-07-03: 10000 [IU] via INTRAVENOUS

## 2015-07-03 MED ORDER — ONDANSETRON HCL 4 MG/2ML IJ SOLN: 4.0000 mg | Freq: Four times a day (QID) | INTRAMUSCULAR | Status: DC | PRN

## 2015-07-03 MED ORDER — ONDANSETRON HCL 4 MG/2ML IJ SOLN
INTRAMUSCULAR | Status: AC
Start: 2015-07-03 — End: 2015-07-03
  Filled 2015-07-03: qty 2

## 2015-07-03 MED ORDER — SODIUM CHLORIDE 0.9 % IV SOLN
INTRAVENOUS | Status: DC | PRN
  Administered 2015-07-03: 500 mL

## 2015-07-03 MED ORDER — LACTATED RINGERS IV SOLN
INTRAVENOUS | Status: DC | PRN
  Administered 2015-07-03 (×2): via INTRAVENOUS

## 2015-07-03 MED ORDER — SUCCINYLCHOLINE CHLORIDE 20 MG/ML IJ SOLN
INTRAMUSCULAR | Status: AC
  Filled 2015-07-03: qty 1

## 2015-07-03 MED ORDER — ROCURONIUM BROMIDE 100 MG/10ML IV SOLN
INTRAVENOUS | Status: DC | PRN
  Administered 2015-07-03: 50 mg via INTRAVENOUS
  Administered 2015-07-03: 10 mg via INTRAVENOUS

## 2015-07-03 MED ORDER — MORPHINE SULFATE (PF) 2 MG/ML IV SOLN
INTRAVENOUS | Status: AC
  Administered 2015-07-03: 2 mg via INTRAVENOUS
  Filled 2015-07-03: qty 1

## 2015-07-03 MED ORDER — ROCURONIUM BROMIDE 50 MG/5ML IV SOLN
INTRAVENOUS | Status: AC
  Filled 2015-07-03: qty 1

## 2015-07-03 MED ORDER — PROTAMINE SULFATE 10 MG/ML IV SOLN
INTRAVENOUS | Status: AC
  Filled 2015-07-03: qty 5

## 2015-07-03 MED ORDER — PHENYLEPHRINE HCL 10 MG/ML IJ SOLN
10.0000 mg | INTRAVENOUS | Status: DC | PRN
  Administered 2015-07-03: 30 ug/min via INTRAVENOUS

## 2015-07-03 MED ORDER — ONDANSETRON HCL 4 MG/2ML IJ SOLN
INTRAMUSCULAR | Status: DC | PRN
  Administered 2015-07-03: 4 mg via INTRAVENOUS

## 2015-07-03 MED ORDER — SODIUM CHLORIDE 0.9 % IV SOLN
0.0125 ug/kg/min | INTRAVENOUS | Status: AC
  Administered 2015-07-03: .1 ug/kg/min via INTRAVENOUS
  Filled 2015-07-03: qty 2000

## 2015-07-03 MED ORDER — LABETALOL HCL 5 MG/ML IV SOLN
10.0000 mg | INTRAVENOUS | Status: DC | PRN
  Administered 2015-07-05: 10 mg via INTRAVENOUS
  Filled 2015-07-03: qty 4

## 2015-07-03 MED ORDER — CEFUROXIME SODIUM 1.5 G IJ SOLR
1.5000 g | Freq: Two times a day (BID) | INTRAMUSCULAR | Status: AC
  Administered 2015-07-03 – 2015-07-04 (×2): 1.5 g via INTRAVENOUS
  Filled 2015-07-03 (×2): qty 1.5

## 2015-07-03 MED ORDER — SODIUM CHLORIDE 0.9 % IV SOLN: 500.0000 mL | Freq: Once | INTRAVENOUS | Status: DC | PRN

## 2015-07-03 MED ORDER — 0.9 % SODIUM CHLORIDE (POUR BTL) OPTIME
TOPICAL | Status: DC | PRN
  Administered 2015-07-03: 3000 mL

## 2015-07-03 MED ORDER — HEPARIN SODIUM (PORCINE) 1000 UNIT/ML IJ SOLN
INTRAMUSCULAR | Status: AC
  Filled 2015-07-03: qty 1

## 2015-07-03 MED ORDER — MAGNESIUM SULFATE 2 GM/50ML IV SOLN: 2.0000 g | Freq: Every day | INTRAVENOUS | Status: DC | PRN

## 2015-07-03 MED ORDER — ENOXAPARIN SODIUM 40 MG/0.4ML ~~LOC~~ SOLN
40.0000 mg | SUBCUTANEOUS | Status: DC
  Administered 2015-07-04: 40 mg via SUBCUTANEOUS
  Filled 2015-07-03: qty 0.4

## 2015-07-03 MED ORDER — LIDOCAINE HCL (CARDIAC) 20 MG/ML IV SOLN
INTRAVENOUS | Status: DC | PRN
  Administered 2015-07-03: 30 mg via INTRAVENOUS
  Administered 2015-07-03: 30 mg via INTRATRACHEAL

## 2015-07-03 MED ORDER — SODIUM CHLORIDE 0.9 % IV SOLN
INTRAVENOUS | Status: DC
  Administered 2015-07-03: 15:00:00 via INTRAVENOUS

## 2015-07-03 SURGICAL SUPPLY — 56 items
ADH SKN CLS APL DERMABOND .7 (GAUZE/BANDAGES/DRESSINGS) ×1
CANISTER SUCTION 2500CC (MISCELLANEOUS) ×2 IMPLANT
CATH ROBINSON RED A/P 18FR (CATHETERS) ×2 IMPLANT
CATH SUCT 10FR WHISTLE TIP (CATHETERS) ×2 IMPLANT
CLIP TI MEDIUM 6 (CLIP) ×2 IMPLANT
CLIP TI WIDE RED SMALL 6 (CLIP) ×4 IMPLANT
CRADLE DONUT ADULT HEAD (MISCELLANEOUS) ×2 IMPLANT
DERMABOND ADVANCED (GAUZE/BANDAGES/DRESSINGS) ×1
DERMABOND ADVANCED .7 DNX12 (GAUZE/BANDAGES/DRESSINGS) IMPLANT
DRAIN CHANNEL 15F RND FF W/TCR (WOUND CARE) IMPLANT
ELECT REM PT RETURN 9FT ADLT (ELECTROSURGICAL) ×2
ELECTRODE REM PT RTRN 9FT ADLT (ELECTROSURGICAL) ×1 IMPLANT
EVACUATOR SILICONE 100CC (DRAIN) IMPLANT
GAUZE SPONGE 4X4 12PLY STRL (GAUZE/BANDAGES/DRESSINGS) ×2 IMPLANT
GLOVE BIO SURGEON STRL SZ 6.5 (GLOVE) ×2 IMPLANT
GLOVE BIO SURGEON STRL SZ7 (GLOVE) ×1 IMPLANT
GLOVE BIOGEL PI IND STRL 6 (GLOVE) IMPLANT
GLOVE BIOGEL PI IND STRL 6.5 (GLOVE) IMPLANT
GLOVE BIOGEL PI IND STRL 7.0 (GLOVE) IMPLANT
GLOVE BIOGEL PI IND STRL 7.5 (GLOVE) ×1 IMPLANT
GLOVE BIOGEL PI INDICATOR 6 (GLOVE) ×1
GLOVE BIOGEL PI INDICATOR 6.5 (GLOVE) ×2
GLOVE BIOGEL PI INDICATOR 7.0 (GLOVE) ×3
GLOVE BIOGEL PI INDICATOR 7.5 (GLOVE) ×2
GLOVE ECLIPSE 6.5 STRL STRAW (GLOVE) ×2 IMPLANT
GLOVE SURG SS PI 7.5 STRL IVOR (GLOVE) ×3 IMPLANT
GOWN STRL REUS W/ TWL LRG LVL3 (GOWN DISPOSABLE) ×2 IMPLANT
GOWN STRL REUS W/ TWL XL LVL3 (GOWN DISPOSABLE) ×1 IMPLANT
GOWN STRL REUS W/TWL LRG LVL3 (GOWN DISPOSABLE) ×8
GOWN STRL REUS W/TWL XL LVL3 (GOWN DISPOSABLE) ×2
HEMOSTAT SNOW SURGICEL 2X4 (HEMOSTASIS) IMPLANT
INSERT FOGARTY SM (MISCELLANEOUS) IMPLANT
KIT BASIN OR (CUSTOM PROCEDURE TRAY) ×2 IMPLANT
KIT ROOM TURNOVER OR (KITS) ×2 IMPLANT
LIQUID BAND (GAUZE/BANDAGES/DRESSINGS) ×2 IMPLANT
NDL HYPO 25GX1X1/2 BEV (NEEDLE) IMPLANT
NEEDLE HYPO 25GX1X1/2 BEV (NEEDLE) IMPLANT
NS IRRIG 1000ML POUR BTL (IV SOLUTION) ×6 IMPLANT
PACK CAROTID (CUSTOM PROCEDURE TRAY) ×2 IMPLANT
PAD ARMBOARD 7.5X6 YLW CONV (MISCELLANEOUS) ×4 IMPLANT
PATCH VASC XENOSURE 1CMX6CM (Vascular Products) ×2 IMPLANT
PATCH VASC XENOSURE 1X6 (Vascular Products) IMPLANT
SHUNT CAROTID BYPASS 10 (VASCULAR PRODUCTS) IMPLANT
SHUNT CAROTID BYPASS 12FRX15.5 (VASCULAR PRODUCTS) IMPLANT
SPONGE INTESTINAL PEANUT (DISPOSABLE) ×1 IMPLANT
SUT ETHILON 3 0 PS 1 (SUTURE) IMPLANT
SUT PROLENE 6 0 BV (SUTURE) ×5 IMPLANT
SUT PROLENE 7 0 BV 1 (SUTURE) IMPLANT
SUT PROLENE 7 0 BV1 MDA (SUTURE) ×1 IMPLANT
SUT SILK 3 0 TIES 17X18 (SUTURE)
SUT SILK 3-0 18XBRD TIE BLK (SUTURE) IMPLANT
SUT VIC AB 3-0 SH 27 (SUTURE) ×4
SUT VIC AB 3-0 SH 27X BRD (SUTURE) ×2 IMPLANT
SUT VICRYL 4-0 PS2 18IN ABS (SUTURE) ×2 IMPLANT
SYR CONTROL 10ML LL (SYRINGE) IMPLANT
WATER STERILE IRR 1000ML POUR (IV SOLUTION) ×2 IMPLANT

## 2015-07-03 NOTE — Interval H&P Note (Signed)
History and Physical Interval Note:  07/03/2015 8:23 AM  [name]  has presented today for surgery, with the diagnosis of Right carotid artery stenosis I65.21  The various methods of treatment have been discussed with the patient and family. After consideration of risks, benefits and other options for treatment, the patient has consented to  Procedure(s): ENDARTERECTOMY CAROTID (Right) as a surgical intervention .  The patient's history has been reviewed, patient examined, no change in status, stable for surgery.  I have reviewed the patient's chart and labs.  Questions were answered to the patient's satisfaction.     Annamarie Major

## 2015-07-03 NOTE — H&P (View-Only) (Signed)
[facility] [place] NOTE   MRN : [id number]  Reason for Consult: carotid stenosis Referring Physician: [name], MD  History of Present Illness: 70 y/o male Reports to the ED with a CC of left arm numbness with two episodes.  The first episode was just tingling sensation.  The second episode the next day was weakness and un controlled left arm movements.  He is right handed.  CAD, HTN managed with lopressor, DM2 managed with metformin, hyperlipidemia managed with lipitor.  He has been admitted for stroke work up.       Current Facility-Administered Medications  Medication Dose Route Frequency Provider Last Rate Last Dose  . 0.9 %  sodium chloride infusion  250 mL Intravenous PRN Theressa Millard, MD      . acetaminophen (TYLENOL) tablet 650 mg  650 mg Oral Q6H PRN Theressa Millard, MD       Or  . acetaminophen (TYLENOL) suppository 650 mg  650 mg Rectal Q6H PRN Theressa Millard, MD      . alum & mag hydroxide-simeth (MAALOX/MYLANTA) 200-200-20 MG/5ML suspension 30 mL  30 mL Oral Q6H PRN Theressa Millard, MD      . atorvastatin (LIPITOR) tablet 20 mg  20 mg Oral Daily Theressa Millard, MD   20 mg at 06/21/15 0858  . clopidogrel (PLAVIX) tablet 75 mg  75 mg Oral Daily [name], MD   75 mg at 06/21/15 0900  . enoxaparin (LOVENOX) injection 40 mg  40 mg Subcutaneous Q24H Theressa Millard, MD   40 mg at 06/21/15 0858  . HYDROmorphone (DILAUDID) injection 0.5-1 mg  0.5-1 mg Intravenous Q3H PRN Theressa Millard, MD      . insulin aspart (novoLOG) injection 0-5 Units  0-5 Units Subcutaneous QHS Theressa Millard, MD   0 Units at 06/18/15 2331  . insulin aspart (novoLOG) injection 0-9 Units  0-9 Units Subcutaneous TID WC Theressa Millard, MD   0 Units at 06/19/15 [phone]  . ondansetron (ZOFRAN) tablet 4 mg  4 mg Oral Q6H PRN Theressa Millard, MD       Or  . ondansetron (ZOFRAN) injection 4 mg  4 mg Intravenous Q6H PRN Harvette Evonnie Dawes, MD       . oxyCODONE (Oxy IR/ROXICODONE) immediate release tablet 5 mg  5 mg Oral Q4H PRN Theressa Millard, MD      . senna-docusate (Senokot-S) tablet 1 tablet  1 tablet Oral QHS PRN Theressa Millard, MD      . sodium chloride flush (NS) 0.9 % injection 3 mL  3 mL Intravenous Q12H Theressa Millard, MD   3 mL at 06/21/15 0859  . sodium chloride flush (NS) 0.9 % injection 3 mL  3 mL Intravenous PRN Theressa Millard, MD        Pt meds include: Statin :Yes Betablocker: Yes ASA: yes Other anticoagulants/antiplatelets: plavix  Past Medical History  Diagnosis Date  . ASCVD (arteriosclerotic cardiovascular disease)     03/2004: DES x2 to the RCA; IMI with stent occlusion in 02/2005- suboptimal Plavix compliance  . Hypertension   . Tobacco abuse     stopped smoking 06/28/09  . Hyperlipidemia   . Morbid obesity ([place])   . Mild depression   . Carcinoma of prostate ([place])     Clopidogrel held for biopsy  . CAD (coronary artery disease)   . Diabetes mellitus without complication ([place])   . Constipation due to pain  medication   . Diabetic neuropathy [facility])     Past Surgical History  Procedure Laterality Date  . Lumbar spine surgery  2002  . Vasectomy    . Prostatectomy  02/2007  . 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 Cardiac catheterization      X 1 stent before having CABG  . Lumbar laminectomy/decompression microdiscectomy N/A 09/06/2014    Procedure: LUMBER DECOMPRESSION L3-5;  Surgeon: Melina Schools, MD;  Location: [place];  Service: Orthopedics;  Laterality: N/A;    Social History Social History  Substance Use Topics  . Smoking status: Former Smoker    Types: Cigarettes  . Smokeless tobacco: Former Systems developer    Quit date: 06/28/2009  . Alcohol Use: 1.2 oz/week    2 Glasses of wine per week     Comment: Occasionally    Family History Family History  Problem Relation Age of Onset  . Kidney disease Mother   . Colon cancer Neg Hx     No Known Allergies   REVIEW  OF SYSTEMS  General: [ ]  Weight loss, [ ]  Fever, [ ]  chills Neurologic: [ ]  Dizziness, [ ]  Blackouts, [ ]  Seizure [ ]  Stroke, [ ]  "Mini stroke", [ ]  Slurred speech, [ ]  Temporary blindness; [ ]  weakness in arms or legs, [ ]  Hoarseness [ ]  Dysphagia Cardiac: [ ]  Chest pain/pressure, [ ]  Shortness of breath at rest [ ]  Shortness of breath with exertion, [ ]  Atrial fibrillation or irregular heartbeat  Vascular: [ ]  Pain in legs with walking, [ ]  Pain in legs at rest, [ ]  Pain in legs at night,  [ ]  Non-healing ulcer, [ ]  Blood clot in vein/DVT,   Pulmonary: [ ]  Home oxygen, [ ]  Productive cough, [ ]  Coughing up blood, [ ]  Asthma,  [ ]  Wheezing [ ]  COPD Musculoskeletal:  [ ]  Arthritis, [ ]  Low back pain, [ ]  Joint pain Hematologic: [ ]  Easy Bruising, [ ]  Anemia; [ ]  Hepatitis Gastrointestinal: [ ]  Blood in stool, [ ]  Gastroesophageal Reflux/heartburn, Urinary: [ ]  chronic Kidney disease, [ ]  on HD - [ ]  MWF or [ ]  TTHS, [ ]  Burning with urination, [ ]  Difficulty urinating Skin: [ ]  Rashes, [ ]  Wounds Psychological: [ ]  Anxiety, [ ]  Depression  Physical Examination Filed Vitals:   06/20/15 2314 06/21/15 0157 06/21/15 0529 06/21/15 0907  BP: 137/67 144/86 140/76 123/82  Pulse: 66 65 73 76  Temp: 97.9 F (36.6 C) 97.7 F (36.5 C) 97.8 F (36.6 C) 97.8 F (36.6 C)  TempSrc: Oral Oral Oral Oral  Resp: 18 18 18 20   Height:      Weight:      SpO2: 94% 93% 95% 94%   Body mass index is 29.85 kg/(m^2).  General:  WDWN in NAD Gait: Normal HENT: WNL Eyes: Pupils equal Pulmonary: normal non-labored breathing , without Rales, rhonchi,  wheezing Cardiac: RRR, without  Murmurs, rubs or gallops; No carotid bruits Abdomen: soft, NT, no masses Skin: no rashes, ulcers noted;  no Gangrene , no cellulitis; no open wounds;   Vascular Exam/Pulses: radial, femoral dp/PT palapable pulses.   Musculoskeletal: no muscle wasting or atrophy; no edema  Neurologic: A&O X 3; Appropriate Affect ;   SENSATION: normal; MOTOR FUNCTION: 5/5 Symmetric Speech is fluent/normal   Significant Diagnostic Studies: CBC Lab Results  Component Value Date   WBC 4.5 06/19/2015   HGB 13.2 06/19/2015   HCT 40.4 06/19/2015   MCV  86.9 06/19/2015   PLT 190 06/19/2015    BMET    Component Value Date/Time   NA 139 06/19/2015 0625   K 4.0 06/19/2015 0625   CL 105 06/19/2015 0625   CO2 27 06/19/2015 0625   GLUCOSE 105* 06/19/2015 0625   BUN 17 06/19/2015 0625   CREATININE 0.84 06/19/2015 0625   CALCIUM 8.8* 06/19/2015 0625   GFRNONAA >60 06/19/2015 0625   GFRAA >60 06/19/2015 [id number]   Estimated Creatinine Clearance: 107.4 mL/min (by C-G formula based on Cr of 0.84).  COAG Lab Results  Component Value Date   INR 1.06 06/18/2015   INR 0.9 02/16/2007     Non-Invasive Vascular Imaging: ]  Ct Head Wo Contrast 06/18/2015 IMPRESSION: Study within normal limits.   Mri brain and Mra head Wo Contrast 06/19/2015 IMPRESSION: 1. Punctate acute infarct in the right parietal cortex. 2. No prior infarct or white matter disease. 3. Negative intracranial MRA.   CUS Done 04/2015 by Dr. Einar Gip - right ICA 16-49% stenosis   IMPRESSION: Atherosclerosis results in approximately 65% stenosis proximal RIGHT internal carotid artery.  Less than 50% stenosis LEFT internal carotid artery origin.  Mild stenosis of LEFT vertebral artery origin.  Study Conclusions  - Left ventricle: The cavity size was normal. Wall thickness was normal. Systolic function was normal. The estimated ejection fraction was in the range of 50% to 55%. Wall motion was normal; there were no regional wall motion abnormalities. Features are consistent with a pseudonormal left ventricular filling pattern, with concomitant abnormal relaxation and increased filling pressure (grade 2 diastolic dysfunction). - Aortic valve: Mildly calcified annulus. Moderately thickened, mildly calcified leaflets. - Left atrium: The  atrium was mildly dilated.  ASSESSMENT/PLAN:  Punctate acute infarct in the right parietal cortex.   Right carotid artery stenosis 65 %  Pending further cardiac work up.  Recommend 30 day cardiac event monitoring to rule out afib per neurology. Started on both plavix and aspirin since being admitting.  Will discuss the possibility for CEA on the right in the near future.  Please wait until Dr. Trula Slade has seen the patient before D/C.   Laurence Slate [facility] 06/21/2015 2:12 PM   I agree with the above, I have seen and examined the patient.  He was admitted on 2-28 following 2 episodes of left arm weakness, lasting for about 1 hour.   His initial head CT was negative, but MRi of the brain showed an acute infarct in the right parietal cortex.  He did not receive TPA, as his symptoms have resolved.  He had carotid dopplers in Dr. Irven Shelling office which showed 1-49% right carotid stenosis in January of 2017.  His CTA recently revealed approximately 65% right carotid stenosis.  He underwent an echo which did not show thrombus.  He is scheduled for 30 day Holter monitor.  Smoking cessation occurred in 3/11.  He is on Lipitor for hypercholesterolemia.  He has type 2 diabetes, which is fairly well controlled with a HbA1c of 6.2.    I discussed 2 options with patient.  #1 would be carotid angiogram to further evaluate the degree of his stenosis, since he has a doppler study revealing <50% stenosis and  A CTA revealing >65% stenosis.  If confirmed to have >50% stenosis, he would need carotid revascularization.  #2 would be to proceed with right CEA, given his most recent CTA showed >50% right carotid stenosis.  After a discussion, we have decided to proceed with right CEA.  I discussed the  risks of surgery, including the risk of stroke and nerve injury.  We also discussed the risk of bleeding, given that he is now on Plavix, which I would not stop.  My office will contact him on Monday with a date and time for his  right CEA within the next 2 weeks.  He is cleared from my perspective for discharge.  Annamarie Major

## 2015-07-03 NOTE — Transfer of Care (Signed)
Immediate Anesthesia Transfer of Care Note  Patient: [name]  Procedure(s) Performed: Procedure(s): RIGHT CAROTID ENDARTERECTOMY WITH BOVINE PERICARDIUM PATCH ANGIOPLASTY (Right)  Patient Location: PACU  Anesthesia Type:General  Level of Consciousness: awake, alert , oriented and patient cooperative  Airway & Oxygen Therapy: Patient Spontanous Breathing and Patient connected to nasal cannula oxygen  Post-op Assessment: Report given to RN, Post -op Vital signs reviewed and stable, Patient moving all extremities and Patient able to stick tongue midline  Post vital signs: Reviewed and stable  Last Vitals:  Filed Vitals:   07/03/15 0705  BP: 161/94  Pulse: 66  Temp: 36.7 C  Resp: 18    Complications: No apparent anesthesia complications

## 2015-07-03 NOTE — Anesthesia Procedure Notes (Signed)
Procedure Name: Intubation Date/Time: 07/03/2015 8:42 AM Performed by: Julian Reil Pre-anesthesia Checklist: Patient identified, Emergency Drugs available, Suction available and Patient being monitored Patient Re-evaluated:Patient Re-evaluated prior to inductionOxygen Delivery Method: Circle system utilized Preoxygenation: Pre-oxygenation with 100% oxygen Intubation Type: IV induction Ventilation: Mask ventilation without difficulty and Oral airway inserted - appropriate to patient size Laryngoscope Size: Mac and 4 Grade View: Grade I Tube type: Oral Tube size: 7.5 mm Number of attempts: 1 Airway Equipment and Method: Stylet and LTA kit utilized Placement Confirmation: ETT inserted through vocal cords under direct vision,  positive ETCO2 and breath sounds checked- equal and bilateral Secured at: 22 cm Tube secured with: Tape Dental Injury: Teeth and Oropharynx as per pre-operative assessment

## 2015-07-03 NOTE — Op Note (Signed)
Patient name: [name] MRN: [id number] DOB: June 29, 1945 Sex: male  07/03/2015 Pre-operative Diagnosis: Symptomatic  right carotid stenosis Post-operative diagnosis:  Same Surgeon:  Annamarie Major Assistants:  Adele Barthel, Silva Bandy Procedure:    right carotid Endarterectomy with bovine pericardial  patch angioplasty Anesthesia:  General Blood Loss:  See anesthesia record Specimens:  Carotid Plaque to pathology  Findings:  75 %stenosis; Thrombus:  none  Indications:  70 year old with recent right brain stroke.  He is nearly back to baseline with mild left arm weakness.  CT scan shows 65% stenosis on the right.  We decided to proceed with right CEA  Procedure:  The patient was identified in the holding area and taken to [place] [address]  The patient was then placed supine on the table.   General endotrachial anesthesia was administered.  The patient was prepped and draped in the usual sterile fashion.  A time out was called and antibiotics were administered.  The incision was made along the anterior border of the right sternocleidomastoid muscle.  Cautery was used to dissect through the subcutaneous tissue.  The platysma muscle was divided with cautery.  The internal jugular vein was exposed along its anterior medial border.  The common facial vein was exposed and then divided between 2-0 silk ties and metal clips.  The common carotid artery was then circumferentially exposed and encircled with an umbilical tape.  The vagus nerve was identified and protected.  Next sharp dissection was used to expose the external carotid artery and the superior thyroid artery.  The were encircled with a blue vessel loop and a 2-0 silk tie respectively.  Finally, the internal carotid was carefully dissected free.  An umbilical tape was placed around the internal carotid artery distal to the diseased segment.  The hypoglossal nerve was visualized throughout and protected.  The patient was given systemic  heparinization.  A bovine carotid patch was selected and prepared on the back table.  A 10 french shunt was also prepared.  After blood pressure readings were appropriate and the heparin had been given time to circulate, the internal carotid artery was occluded with a baby Gregory clamp.  The external and common carotid arteries were then occluded with vascular clamps and the 2-0 tie tightened on the superior thyroid artery.  A #11 blade was used to make an arteriotomy in the common carotid artery.  This was extended with Potts scissors along the anterior and lateral border of the common and internal carotid artery.  Approximately 75% stenosis was identified.  There was no thrombus identified.  The 10 french shunt was then placed.  A kleiner kuntz elevator was used to perform endarterectomy.  An eversion endarterectomy was performed in the external carotid artery.  A good distal endpoint was obtained in the internal carotid artery.  The specimen was removed and sent to pathology.  Heparinized saline was used to irrigate the endarterectomized field.  All potential embolic debris was removed.  Bovine pericardial patch angioplasty was then performed using a running 6-0 Prolene. Just prior to completion of the repair, the shunt was removed. The common internal and external carotid arteries were all appropriately flushed. The artery was again irrigated with heparin saline.  The anastomosis was then secured. The clamp was first released on the external carotid artery followed by the common carotid artery approximately 30 seconds later, bloodflow was reestablish through the internal carotid artery.  Next, a hand-held  Doppler was used to evaluate the  signals in the common, external, and internal  carotid arteries, all of which had appropriate signals. I then administered  50 mg protamine. The wound was then irrigated.  After hemostasis was achieved, the carotid sheath was reapproximated with 3-0 Vicryl. The  platysma  muscle was reapproximated with running 3-0 Vicryl. The skin  was closed with 4-0 Vicryl. Dermabond was placed on the skin. The  patient was then successfully extubated. His neurologic exam was  similar to his preprocedural exam. The patient was then taken to recovery room  in stable condition. There were no complications.     Disposition:  To PACU in stable condition.  Relevant Operative Details:  Normal anatomy.  Ulcerated plaque at the bifurcation.  Very oozy from anti-platelet medication  V. Annamarie Major, M.D. [facility] [place] Office: [phone] Pager:  [phone]

## 2015-07-03 NOTE — Anesthesia Preprocedure Evaluation (Addendum)
Anesthesia Evaluation  Patient identified by MRN, date of birth, ID band Patient awake    Reviewed: Allergy & Precautions, NPO status , Patient's Chart, lab work & pertinent test results  History of Anesthesia Complications Negative for: history of anesthetic complications  Airway Mallampati: II  TM Distance: >3 FB Neck ROM: Full    Dental  (+) Missing, Dental Advisory Given   Pulmonary COPD, former smoker,    breath sounds clear to auscultation       Cardiovascular hypertension (no longer on meds), + CAD, + Cardiac Stents (DES RCA ), + CABG and + Peripheral Vascular Disease   Rhythm:Regular Rate:Normal  3/17 ECHO: EF [id number], grade 2 diastolic dysfunction, valves OK '16 Stress: small peri-infarct ischemia with EF 46%   Neuro/Psych TIA   GI/Hepatic negative GI ROS, Neg liver ROS,   Endo/Other  diabetes (glu 96), Oral Hypoglycemic Agents  Renal/GU negative Renal ROS     Musculoskeletal   Abdominal   Peds  Hematology   Anesthesia Other Findings   Reproductive/Obstetrics                            Anesthesia Physical Anesthesia Plan  ASA: III  Anesthesia Plan: General   Post-op Pain Management:    Induction: Intravenous  Airway Management Planned: Oral ETT  Additional Equipment: Arterial line  Intra-op Plan:   Post-operative Plan: Extubation in OR  Informed Consent: I have reviewed the patients History and Physical, chart, labs and discussed the procedure including the risks, benefits and alternatives for the proposed anesthesia with the patient or authorized representative who has indicated his/her understanding and acceptance.   Dental advisory given  Plan Discussed with: CRNA and Surgeon  Anesthesia Plan Comments: (Plan routine monitors, A line, GETA)        Anesthesia Quick Evaluation

## 2015-07-03 NOTE — Anesthesia Postprocedure Evaluation (Signed)
Anesthesia Post Note  Patient: [name]  Procedure(s) Performed: Procedure(s) (LRB): RIGHT CAROTID ENDARTERECTOMY WITH BOVINE PERICARDIUM PATCH ANGIOPLASTY (Right)  Patient location during evaluation: PACU Anesthesia Type: General Level of consciousness: awake and alert, oriented and patient cooperative Pain management: pain level controlled Vital Signs Assessment: post-procedure vital signs reviewed and stable Respiratory status: spontaneous breathing, nonlabored ventilation and respiratory function stable Cardiovascular status: blood pressure returned to baseline and stable Postop Assessment: no signs of nausea or vomiting Anesthetic complications: no    Last Vitals:  Filed Vitals:   07/03/15 1346 07/03/15 1500  BP: 122/61   Pulse: 53   Temp: 36.6 C 36.2 C  Resp: 17     Last Pain:  Filed Vitals:   07/03/15 1504  PainSc: 2                  Crockett Rallo,E. Greycen Felter

## 2015-07-03 NOTE — Progress Notes (Signed)
  [facility] Day of Surgery Note  Subjective:  Pain with incision.   Filed Vitals:   07/03/15 1346 07/03/15 1500  BP: 122/61   Pulse: 53   Temp: 97.8 F (36.6 C) 97.1 F (36.2 C)  Resp: 17     Right neck incision c/d/i, no hematoma. Moving all extremities equally, tongue midline, no smile asymmetry  Assessment/Plan:  This is a 70 y.o. male who is s/p right CEA  Stable post-op. Neuro exam intact.  Incision without hematoma.  Plan d/c in am.    Virgina Jock, PA-C Pager: [phone] 07/03/2015 4:04 PM

## 2015-07-04 ENCOUNTER — Telehealth: Payer: Self-pay | Admitting: Surgery

## 2015-07-04 ENCOUNTER — Encounter (HOSPITAL_COMMUNITY): Payer: Self-pay | Admitting: Surgery

## 2015-07-04 LAB — BASIC METABOLIC PANEL
Anion gap: 9 (ref 5–15)
BUN: 11 mg/dL (ref 6–20)
CO2: 26 mmol/L (ref 22–32)
Calcium: 8.4 mg/dL — ABNORMAL LOW (ref 8.9–10.3)
Chloride: 105 mmol/L (ref 101–111)
Creatinine, Ser: 0.97 mg/dL (ref 0.61–1.24)
GFR calc Af Amer: >60 mL/min (ref >60)
GFR calc non Af Amer: >60 mL/min (ref >60)
Glucose, Bld: 110 mg/dL — ABNORMAL HIGH (ref 65–99)
Potassium: 4.1 mmol/L (ref 3.5–5.1)
Sodium: 140 mmol/L (ref 135–145)

## 2015-07-04 LAB — CBC
HCT: 36.7 % — ABNORMAL LOW (ref 39.0–52.0)
Hemoglobin: 11.7 g/dL — ABNORMAL LOW (ref 13.0–17.0)
MCH: 28.2 pg (ref 26.0–34.0)
MCHC: 31.9 g/dL (ref 30.0–36.0)
MCV: 88.4 fL (ref 78.0–100.0)
Platelets: 152 10*3/uL (ref 150–400)
RBC: 4.15 MIL/uL — ABNORMAL LOW (ref 4.22–5.81)
RDW: 13.2 % (ref 11.5–15.5)
WBC: 5.6 10*3/uL (ref 4.0–10.5)

## 2015-07-04 LAB — GLUCOSE, CAPILLARY
Glucose-Capillary: 114 mg/dL — ABNORMAL HIGH (ref 65–99)
Glucose-Capillary: 106 mg/dL — ABNORMAL HIGH (ref 65–99)
Glucose-Capillary: 129 mg/dL — ABNORMAL HIGH (ref 65–99)
Glucose-Capillary: 276 mg/dL — ABNORMAL HIGH (ref 65–99)

## 2015-07-04 MED ORDER — LOSARTAN POTASSIUM 50 MG PO TABS: 100.0000 mg | ORAL_TABLET | Freq: Every day | ORAL | Status: DC

## 2015-07-04 MED ORDER — AMLODIPINE BESYLATE 10 MG PO TABS
10.0000 mg | ORAL_TABLET | Freq: Every day | ORAL | Status: DC
  Administered 2015-07-04 – 2015-07-05 (×2): 10 mg via ORAL
  Filled 2015-07-04 (×2): qty 1

## 2015-07-04 NOTE — Telephone Encounter (Signed)
Spoke with pt, dpm °

## 2015-07-04 NOTE — Telephone Encounter (Signed)
-----   Message from Mena Goes, RN sent at 07/03/2015 10:31 AM EDT ----- Regarding: schedule   ----- Message -----    From: Alvia Grove, PA-C    Sent: 07/03/2015  10:18 AM      To: [facility] Charge Pool  S/p right CEA 07/03/15  F/u with Dr. Trula Slade in 2 weeks.  Thanks Maudie Mercury

## 2015-07-04 NOTE — Progress Notes (Signed)
Hydralazine iv  prn given

## 2015-07-04 NOTE — Care Management Note (Signed)
Case Management Note  Patient Details  Name: [name] MRN: [id number] Date of Birth: [birth year]  Subjective/Objective:    s/p right carotid endarterectomy                Action/Plan: Discharge Planning: NCM spoke to pt and wife, [name] at bedside. Pt lives at home with wife. He was independent prior to admission. He can afford his medications at home. No DME need for ambulation.   PCP-  Chapman Fitch CAMMIE MD   Expected Discharge Date:  07/05/2015               Expected Discharge Plan:  Home/Self Care  In-House Referral:  NA  Discharge planning Services  CM Consult  Post Acute Care Choice:  NA Choice offered to:  NA  DME Arranged:  N/A DME Agency:  NA  HH Arranged:  NA HH Agency:  NA  Status of Service:  Completed, signed off  Medicare Important Message Given:    Date Medicare IM Given:    Medicare IM give by:    Date Additional Medicare IM Given:    Additional Medicare Important Message give by:     If discussed at [place] of Stay Meetings, dates discussed:    Additional Comments:  Erenest Rasher, RN 07/04/2015, 4:56 PM

## 2015-07-04 NOTE — Progress Notes (Signed)
Utilization review completed. Jomes Giraldo, RN, BSN. 

## 2015-07-04 NOTE — Progress Notes (Addendum)
  [facility] Progress Note  Subjective  - POD #1  Having some nausea and vomiting. Otherwise no complaints.   Objective Filed Vitals:   07/04/15 0400 07/04/15 0446  BP: 177/78 166/75  Pulse: 65 65  Temp:  99.3 F (37.4 C)  Resp: 15 13    Intake/Output Summary (Last 24 hours) at 07/04/15 0737 Last data filed at 07/04/15 0600  Gross per 24 hour  Intake 2623.75 ml  Output    550 ml  Net 2073.75 ml    Right neck incision healing well.  5/5 strength upper and lower extremities.   Assessment/Planning: 70 y.o. male is s/p right carotid endarterectomy 1 Day Post-Op   Neuro exam intact.  Incision healing well.  Voiding adequately. Needs to ambulate. If nausea/vomiting improves, plan d/c later today.   Alvia Grove 07/04/2015 7:37 AM --  Addendum Per RN, patient's systolic BP has been in [id number] and prn hydralazine given. Patient reports that he was taken off of metoprolol a week before surgery. From chart review, patient was on losartan, amlodipine and metoprolol prior to recent hospitalization for CVA.  Will restart amlodipine.   Virgina Jock, PA-C Laboratory CBC    Component Value Date/Time   WBC 5.6 07/04/2015 0418   HGB 11.7* 07/04/2015 0418   HCT 36.7* 07/04/2015 0418   PLT 152 07/04/2015 0418    BMET    Component Value Date/Time   NA 140 07/04/2015 0418   K 4.1 07/04/2015 0418   CL 105 07/04/2015 0418   CO2 26 07/04/2015 0418   GLUCOSE 110* 07/04/2015 0418   BUN 11 07/04/2015 0418   CREATININE 0.97 07/04/2015 0418   CALCIUM 8.4* 07/04/2015 0418   GFRNONAA >60 07/04/2015 0418   GFRAA >60 07/04/2015 0418    COAG Lab Results  Component Value Date   INR 1.04 07/01/2015   INR 1.06 06/18/2015   INR 0.9 02/16/2007   No results found for: PTT  Antibiotics Anti-infectives    Start     Dose/Rate Route Frequency Ordered Stop   07/03/15 2000  cefUROXime (ZINACEF) 1.5 g in dextrose 5 % 50 mL IVPB     1.5 g 100 mL/hr over 30  Minutes Intravenous Every 12 hours 07/03/15 1111 07/04/15 1959   07/03/15 0800  cefUROXime (ZINACEF) 1.5 g in dextrose 5 % 50 mL IVPB     1.5 g 100 mL/hr over 30 Minutes Intravenous To ShortStay Surgical 07/02/15 0724 07/03/15 [place], PA-C [facility] Office: [phone] Pager: [phone] 07/04/2015 7:37 AM  Addendum  I have independently interviewed and examined the patient, and I agree with the physician assistant's findings.  Nausea resolved.  Tolerated dinner. Wants to go home.  BP better once Norvasc resumed.  Neuro intact without deficits.  Home in AM if BP ok overnight and nausea remains resolved  Adele Barthel, MD [facility] [place] Office: [phone] Pager: [phone]  07/04/2015, 7:56 PM

## 2015-07-05 LAB — GLUCOSE, CAPILLARY: Glucose-Capillary: 121 mg/dL — ABNORMAL HIGH (ref 65–99)

## 2015-07-05 MED ORDER — AMLODIPINE BESYLATE 10 MG PO TABS: 10.0000 mg | ORAL_TABLET | Freq: Every day | ORAL | Status: DC

## 2015-07-05 NOTE — Progress Notes (Signed)
Discharge instructions given to patient and wife all questions answered at this time, pt. Verbalized understanding.  Prescriptions given to patient.  Pt. VSS with no s/s of distress noted.  Patient stable at discharge.

## 2015-07-05 NOTE — Discharge Summary (Signed)
[facility] Discharge Summary  [name] 05/16/1945 70 y.o. male  [id number]  Admission Date: 07/03/2015  Discharge Date: 07/05/2015  Physician: Harold Barban, MD  Admission Diagnosis: Right carotid artery stenosis I65.21  HPI:   This is a 70 y.o. male who reported to the [facility] ED with complaints of two episodes left arm numbness  The first episode was just tingling sensation. The second episode the next day, was weakness and uncontrolled left arm movements. He is right handed.He was admitted for stroke work up on 06/18/2015. His initial head CT was negative, but MRI of the brain showed an acute infarct in the right parietal cortex. He did not receive TPA, as his symptoms had resolved. He had carotid dopplers in Dr. Irven Shelling office which showed 1-49% right carotid stenosis in January of 2017. His CTA recently revealed approximately 65% right carotid stenosis. He underwent an echo which did not show thrombus. He is scheduled for 30 day Holter monitor. Smoking cessation occurred in 3/11. He is on Lipitor for hypercholesterolemia. He has type 2 diabetes, which is fairly well controlled with a HbA1c of 6.2.   Hospital Course:  The patient was admitted to the hospital and taken to the operating room on 07/03/2015 and underwent right carotid endarterectomy.    The patient tolerated the procedure well and was transported to the PACU in stable condition.  By POD 1, the patient's neuro status was intact. The right neck incision was healing well without evidence of a hematoma. He had some episodes of nausea and vomiting overnight and he was kept for observation. He became hypertensive with a maximum systolic blood pressure in the 190s. He was given IV hydralazine. The patient was restarted on his home dose of norvasc and his blood pressure stabilized.   On POD 2, the patient's nausea and vomiting had resolved. His neck incision was intact and neuro exam  intact. His hypertension was stable. His pain was adequately controlled and he was voiding, ambulating and tolerating a diet without difficulty. He was discharged home on POD 2 in good condition.     Recent Labs  07/04/15 0418  NA 140  K 4.1  CL 105  CO2 26  GLUCOSE 110*  BUN 11  CALCIUM 8.4*    Recent Labs  07/03/15 1600 07/04/15 0418  WBC 7.1 5.6  HGB 11.9* 11.7*  HCT 36.9* 36.7*  PLT 162 152   No results for input(s): INR in the last 72 hours.  Discharge Instructions:   The patient is discharged to home with extensive instructions on wound care and progressive ambulation.  They are instructed not to drive or perform any heavy lifting until returning to see the physician in his office.  Discharge Instructions    CAROTID Sugery: Call MD for difficulty swallowing or speaking; weakness in arms or legs that is a new symtom; severe headache.  If you have increased swelling in the neck and/or  are having difficulty breathing, CALL [phone]    Complete by:  As directed      Call MD for:  redness, tenderness, or signs of infection (pain, swelling, bleeding, redness, odor or green/yellow discharge around incision site)    Complete by:  As directed      Call MD for:  severe or increased pain, loss or decreased feeling  in affected limb(s)    Complete by:  As directed      Call MD for:  temperature >100.5    Complete by:  As directed      Discharge wound care:    Complete by:  As directed   Shower daily and wash wound with soap and water and pat dry.  Do not peel off the skin glue on your incision, it will come off on its own.  Do not shave on top of incision.     Driving Restrictions    Complete by:  As directed   No driving for 1 week     Increase activity slowly    Complete by:  As directed   Walk with assistance use walker or cane as needed     Lifting restrictions    Complete by:  As directed   No lifting for 2 weeks     Resume previous diet    Complete by:  As directed             Discharge Diagnosis:  Right carotid artery stenosis I65.21  Secondary Diagnosis: Patient Active Problem List   Diagnosis Date Noted  . Carotid artery stenosis, symptomatic 07/03/2015  . Carotid stenosis   . Acute CVA (cerebrovascular accident) ([place]) 06/19/2015  . TIA (transient ischemic attack) 06/18/2015  . ASCVD (arteriosclerotic cardiovascular disease) 06/18/2015  . Diabetes mellitus without complication ([place]) [id number]  . Hypertension 06/18/2015  . Hyperlipidemia 06/18/2015  . Spinal stenosis 09/06/2014  . Atherosclerosis of native arteries of the extremities with intermittent claudication 03/09/2013  . HYPERLIPIDEMIA 04/08/2009  . TOBACCO ABUSE 04/08/2009  . ATHEROSCLEROTIC CARDIOVASCULAR DISEASE 04/08/2009  . INTERMITTENT VERTIGO 04/08/2009  . ADENOCARCINOMA, PROSTATE 03/21/2009  . ABDOMINAL PAIN -GENERALIZED 03/21/2009   Past Medical History  Diagnosis Date  . ASCVD (arteriosclerotic cardiovascular disease)     03/2004: DES x2 to the RCA; IMI with stent occlusion in 02/2005- suboptimal Plavix compliance  . Hypertension   . Tobacco abuse     stopped smoking 06/28/09  . Hyperlipidemia   . Morbid obesity ([place])   . Mild depression   . Carcinoma of prostate ([place])     Clopidogrel held for biopsy  . CAD (coronary artery disease)   . Diabetes mellitus without complication ([place])   . Constipation due to pain medication   . Diabetic neuropathy ([place])   . Myocardial infarction ([place])   . Shortness of breath dyspnea   . Anxiety       Medication List    TAKE these medications        amLODipine 10 MG tablet  Commonly known as:  NORVASC  Take 1 tablet (10 mg total) by mouth daily.     aspirin EC 81 MG tablet  Take 81 mg by mouth daily.     atorvastatin 20 MG tablet  Commonly known as:  LIPITOR  Take 20 mg by mouth daily.     clopidogrel 75 MG tablet  Commonly known as:  PLAVIX  Take 1 tablet (75 mg total) by mouth daily.     metFORMIN 1000 MG (MOD)  24 hr tablet  Commonly known as:  GLUMETZA  Take 1,000 mg by mouth daily with breakfast.     oxyCODONE-acetaminophen 5-325 MG tablet  Commonly known as:  PERCOCET/ROXICET  Take 1-2 tablets by mouth every 4 (four) hours as needed for moderate pain.        Percocet #20 No Refill  Disposition: Home  Patient's condition: is Good  Follow up: 1. Dr.  Trula Slade in 2 weeks.   Virgina Jock, PA-C [facility] [phone]  --- For [facility] use --- Instructions: Press  F2 to tab through selections.  Delete question if not applicable.   Modified Rankin score at D/C (0-6): 0  IV medication needed for:  1. Hypertension: Yes 2. Hypotension: No  Post-op Complications: No  1. Post-op CVA or TIA: No  2. CN injury: No  3. Myocardial infarction: No  4.  CHF: No  5.  Dysrhythmia (new): No  6. Wound infection: No  7. Reperfusion symptoms: No  8. Return to OR: No  Discharge medications: Statin use:  Yes If No: [ ]  For Medical reasons, [ ]  Non-compliant, [ ]  Not-indicated ASA use:  Yes  If No: [ ]  For Medical reasons, [ ]  Non-compliant, [ ]  Not-indicated Beta blocker use:  No If No: [x ] For Medical reasons, [ ]  Non-compliant, [ ]  Not-indicated ACE-Inhibitor use:  No If No: [x ] For Medical reasons, [ ]  Non-compliant, [ ]  Not-indicated P2Y12 Antagonist use: Yes, [ x] Plavix, [ ]  Plasugrel, [ ]  Ticlopinine, [ ]  Ticagrelor, [ ]  Other, [ ]  No for medical reason, [ ]  Non-compliant, [ ]  Not-indicated Anti-coagulant use:  No, [ ]  Warfarin, [ ]  Rivaroxaban, [ ]  Dabigatran, [ ]  Other, [ ]  No for medical reason, [ ]  Non-compliant, [ ]  Not-indicated

## 2015-07-05 NOTE — Progress Notes (Signed)
  [facility] Progress Note  Subjective  - POD #2  Nausea resolved.   Objective Filed Vitals:   07/05/15 0400 07/05/15 0500  BP: 172/86 164/84  Pulse: 76 76  Temp: 98.5 F (36.9 C)   Resp: 14 14    Intake/Output Summary (Last 24 hours) at 07/05/15 0729 Last data filed at 07/05/15 0500  Gross per 24 hour  Intake   2735 ml  Output      0 ml  Net   2735 ml   Neck incision without hematoma Moving all extremities equally. Tongue without deviation. Smile symmetric.   Assessment/Planning: 70 y.o. male is s/p: right carotid endarterectomy 2 Days Post-Op   BP improved with addition of norvasc. Patient says baseline BP is in 170s. Will have patient follow-up with PCP. Neuro exam intact.  Incision healing well.  Discharge home today.   [name] 07/05/2015 7:29 AM --  Laboratory CBC    Component Value Date/Time   WBC 5.6 07/04/2015 0418   HGB 11.7* 07/04/2015 0418   HCT 36.7* 07/04/2015 0418   PLT 152 07/04/2015 0418    BMET    Component Value Date/Time   NA 140 07/04/2015 0418   K 4.1 07/04/2015 0418   CL 105 07/04/2015 0418   CO2 26 07/04/2015 0418   GLUCOSE 110* 07/04/2015 0418   BUN 11 07/04/2015 0418   CREATININE 0.97 07/04/2015 0418   CALCIUM 8.4* 07/04/2015 0418   GFRNONAA >60 07/04/2015 0418   GFRAA >60 07/04/2015 0418    COAG Lab Results  Component Value Date   INR 1.04 07/01/2015   INR 1.06 06/18/2015   INR 0.9 02/16/2007   No results found for: PTT  Antibiotics Anti-infectives    Start     Dose/Rate Route Frequency Ordered Stop   07/03/15 2000  cefUROXime (ZINACEF) 1.5 g in dextrose 5 % 50 mL IVPB     1.5 g 100 mL/hr over 30 Minutes Intravenous Every 12 hours 07/03/15 1111 07/04/15 0833   07/03/15 0800  cefUROXime (ZINACEF) 1.5 g in dextrose 5 % 50 mL IVPB     1.5 g 100 mL/hr over 30 Minutes Intravenous To ShortStay Surgical 07/02/15 0724 07/03/15 [place], PA-C [facility] Office: [phone] Pager: [phone] 07/05/2015 7:29 AM

## 2015-07-09 DIAGNOSIS — L821 Other seborrheic keratosis: Secondary | ICD-10-CM | POA: Diagnosis not present

## 2015-07-12 ENCOUNTER — Encounter: Payer: Self-pay | Admitting: Surgery

## 2015-07-22 ENCOUNTER — Encounter: Payer: Self-pay | Admitting: Surgery

## 2015-07-22 ENCOUNTER — Encounter: Payer: Self-pay | Admitting: *Deleted

## 2015-07-22 ENCOUNTER — Ambulatory Visit (INDEPENDENT_AMBULATORY_CARE_PROVIDER_SITE_OTHER): Payer: Medicare Other | Admitting: Surgery

## 2015-07-22 VITALS — BP 124/76 | HR 86 | Ht 74.0 in | Wt 226.3 lb

## 2015-07-22 DIAGNOSIS — Z48812 Encounter for surgical aftercare following surgery on the circulatory system: Secondary | ICD-10-CM

## 2015-07-22 DIAGNOSIS — I6521 Occlusion and stenosis of right carotid artery: Secondary | ICD-10-CM

## 2015-07-22 NOTE — Addendum Note (Signed)
Addended by: Peter Minium K on: 07/22/2015 03:00 PM   Modules accepted: Orders

## 2015-07-22 NOTE — Progress Notes (Signed)
Patient name: [name] MRN: [id number] DOB: 1945/11/09 Sex: male     Chief Complaint  Patient presents with  . Routine Post Op    2 wk f/u s/p R CEA     HISTORY OF PRESENT ILLNESS: The patient is back for his first postoperative visit.  On 07/03/2015 he underwent right carotid endarterectomy with patch angioplasty.  Intraoperative findings included approximate 75% stenosis with an ulcerated area at the carotid bifurcation.  The patient's operation was done because he had a stroke prior to his surgery which consist of left arm symptoms.  He denies having any additional symptoms since his operation.  Past Medical History  Diagnosis Date  . ASCVD (arteriosclerotic cardiovascular disease)     03/2004: DES x2 to the RCA; IMI with stent occlusion in 02/2005- suboptimal Plavix compliance  . Hypertension   . Tobacco abuse     stopped smoking 06/28/09  . Hyperlipidemia   . Morbid obesity ([place])   . Mild depression   . Carcinoma of prostate ([place])     Clopidogrel held for biopsy  . CAD (coronary artery disease)   . Diabetes mellitus without complication ([place])   . Constipation due to pain medication   . Diabetic neuropathy ([place])   . Myocardial infarction ([place])   . Shortness of breath dyspnea   . Anxiety     Past Surgical History  Procedure Laterality Date  . Lumbar spine surgery  2002  . Vasectomy    . Prostatectomy  02/2007  . 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 Cardiac catheterization      X 1 stent before having CABG  . Lumbar laminectomy/decompression microdiscectomy N/A 09/06/2014    Procedure: LUMBER DECOMPRESSION L3-5;  Surgeon: Melina Schools, MD;  Location: [facility];  Service: Orthopedics;  Laterality: N/A;  . Back surgery    . Endarterectomy Right 07/03/2015    Procedure: RIGHT CAROTID ENDARTERECTOMY WITH BOVINE PERICARDIUM PATCH ANGIOPLASTY;  Surgeon: Serafina Mitchell, MD;  Location: [facility] OR;  Service: Vascular;  Laterality: Right;    Social History     Social History  . Marital Status: Married    Spouse Name: N/A  . Number of Children: N/A  . Years of Education: N/A   Occupational History  . Hospital director    Social History Main Topics  . Smoking status: Former Smoker    Types: Cigarettes  . Smokeless tobacco: Former Systems developer    Quit date: 06/28/2009  . Alcohol Use: 1.2 oz/week    2 Glasses of wine per week     Comment: Occasionally  . Drug Use: No  . Sexual Activity: Not on file   Other Topics Concern  . Not on file   Social History Narrative   Married with 3 sons and 1 daughter   Daily caffeine use, 3 per day    Family History  Problem Relation Age of Onset  . Kidney disease Mother   . Colon cancer Neg Hx     Allergies as of 07/22/2015  . (No Known Allergies)    Current Outpatient Prescriptions on File Prior to Visit  Medication Sig Dispense Refill  . amLODipine (NORVASC) 10 MG tablet Take 1 tablet (10 mg total) by mouth daily. 30 tablet 11  . aspirin EC 81 MG tablet Take 81 mg by mouth daily.    Marland Kitchen atorvastatin (LIPITOR) 20 MG tablet Take 20 mg by mouth daily.    . clopidogrel (PLAVIX) 75 MG  tablet Take 1 tablet (75 mg total) by mouth daily. 30 tablet 0  . metFORMIN (GLUMETZA) 1000 MG (MOD) 24 hr tablet Take 1,000 mg by mouth daily with breakfast.    . oxyCODONE-acetaminophen (PERCOCET/ROXICET) 5-325 MG tablet Take 1-2 tablets by mouth every 4 (four) hours as needed for moderate pain. 20 tablet 0   No current facility-administered medications on file prior to visit.      PHYSICAL EXAMINATION:   Vital signs are  Filed Vitals:   07/22/15 1441 07/22/15 1442  BP: 120/71 124/76  Pulse: 86   Height: 6\' 2"  (1.88 m)   Weight: 226 lb 4.8 oz (102.649 kg)   SpO2: 96%    Body mass index is 29.04 kg/(m^2). General: The patient appears their stated age. Right carotid endarterectomy incision is healing nicely.  He is neurologically intact  Diagnostic Studies None  Assessment: Status post right carotid  endarterectomy Plan: The patient is doing very well following his operation.  He remains neurologically intact.  He has run out of his Plavix.  I have told him to just continue taking a baby aspirin.  He will follow up in 6 months with a carotid duplex.  Eldridge Abrahams, M.D. [facility] [place] Office: [phone] Pager:  [phone]

## 2015-08-20 ENCOUNTER — Encounter: Payer: Self-pay | Admitting: Neurology

## 2015-08-20 ENCOUNTER — Ambulatory Visit (INDEPENDENT_AMBULATORY_CARE_PROVIDER_SITE_OTHER): Payer: Medicare Other | Admitting: Neurology

## 2015-08-20 VITALS — BP 141/79 | HR 55 | Ht 74.0 in | Wt 227.8 lb

## 2015-08-20 DIAGNOSIS — I10 Essential (primary) hypertension: Secondary | ICD-10-CM | POA: Diagnosis not present

## 2015-08-20 DIAGNOSIS — I639 Cerebral infarction, unspecified: Secondary | ICD-10-CM

## 2015-08-20 DIAGNOSIS — I6521 Occlusion and stenosis of right carotid artery: Secondary | ICD-10-CM

## 2015-08-20 DIAGNOSIS — E785 Hyperlipidemia, unspecified: Secondary | ICD-10-CM | POA: Diagnosis not present

## 2015-08-20 DIAGNOSIS — Z9889 Other specified postprocedural states: Secondary | ICD-10-CM | POA: Diagnosis not present

## 2015-08-20 DIAGNOSIS — E1159 Type 2 diabetes mellitus with other circulatory complications: Secondary | ICD-10-CM | POA: Insufficient documentation

## 2015-08-20 NOTE — Progress Notes (Signed)
STROKE NEUROLOGY FOLLOW UP NOTE  NAME: [name] DOB: Feb 23, 1946  REASON FOR VISIT: stroke follow up HISTORY FROM: pt and chart  Today we had the pleasure of seeing [name] in follow-up at our Neurology Clinic. Pt was accompanied by wife.   History Summary [name] is a 70 y.o. male with history of CAD, HTN, Diabetes, HLD, former smokes was admitted on 06/18/15 for 2 episodes of of L arm weakness. He did not receive IV t-PA due to resolved symptoms. 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 MRA unremarkable. TTE EF 50-55%. LDL 65 and A1C 6.2. But CTA neck showed right ICA 65% stenosis and soft plaque, likely the source of emboli. VVS consulted and he was put on DAPT for 3 months along with continuing lipitor.   Interval History During the interval time, the patient has been doing well. Had right CEA with Dr.Brabham on 07/03/2015 with patch angioplasty. Intraoperative findings included approximate 75% stenosis with an ulcerated area at the carotid bifurcation. Post op he is doing well. Off plavix and now on ASA 81.   REVIEW OF SYSTEMS: Full 14 system review of systems performed and notable only for those listed below and in HPI above, all others are negative:  Constitutional:   Cardiovascular:  Ear/Nose/Throat:   Skin:  Eyes:   Respiratory:   Gastroitestinal:   Genitourinary:  Hematology/Lymphatic:   Endocrine:  Musculoskeletal:  Joint pain, back pain Allergy/Immunology:   Neurological:   Psychiatric:  Sleep: snoring  The following represents the patient's updated allergies and side effects list: No Known Allergies  The neurologically relevant items on the patient's problem list were reviewed on today's visit.  Neurologic Examination  A problem focused neurological exam (12 or more points of the single system neurologic examination, vital signs counts as 1 point, cranial nerves count for 8 points) was performed.  Blood pressure  141/79, pulse 55, height 6\' 2"  (1.88 m), weight 227 lb 12.8 oz (103.329 kg).  General - Well nourished, well developed, in no apparent distress.  Ophthalmologic - Fundi not visualized due to noncooperation.  Cardiovascular - Regular rate and rhythm.  Mental Status -  Level of arousal and orientation to time, place, and person were intact. Language including expression, naming, repetition, comprehension was assessed and found intact. Fund of Knowledge was assessed and was intact.  Cranial Nerves II - XII - II - Visual field intact OU. III, IV, VI - Extraocular movements intact. V - Facial sensation intact bilaterally. VII - Facial movement intact bilaterally. VIII - Hearing & vestibular intact bilaterally. X - Palate elevates symmetrically. XI - Chin turning & shoulder shrug intact bilaterally. XII - Tongue protrusion intact.  Motor Strength - The patient's strength was normal in all extremities and pronator drift was absent.  Bulk was normal and fasciculations were absent.   Motor Tone - Muscle tone was assessed at the neck and appendages and was normal.  Reflexes - The patient's reflexes were 1+ in all extremities and he had no pathological reflexes.  Sensory - Light touch, temperature/pinprick were assessed and were normal.    Coordination - The patient had normal movements in the hands and feet with no ataxia or dysmetria.  Tremor was absent.  Gait and Station - The patient's transfers, posture, gait, station, and turns were observed as normal.   Functional score  mRS = 0   0 - No symptoms.   1 - No significant disability. Able to carry out all  usual activities, despite some symptoms.   2 - Slight disability. Able to look after own affairs without assistance, but unable to carry out all previous activities.   3 - Moderate disability. Requires some help, but able to walk unassisted.   4 - Moderately severe disability. Unable to attend to own bodily needs without  assistance, and unable to walk unassisted.   5 - Severe disability. Requires constant nursing care and attention, bedridden, incontinent.   6 - Dead.   NIH Stroke Scale = 0    Data reviewed: I personally reviewed the images and agree with the radiology interpretations.  Ct Head Wo Contrast 06/18/2015  Study within normal limits.   Mri brain and Mra head Wo Contrast 06/19/2015  1. Punctate acute infarct in the right parietal cortex.  2. No prior infarct or white matter disease.  3. Negative intracranial MRA.   CUS Done 04/2015 by Dr. Einar Gip - right ICA 16-49% stenosis   TTE  06/20/2015 Study Conclusions - Left ventricle: The cavity size was normal. Wall thickness was normal. Systolic function was normal. The estimated ejection fraction was in the range of 50% to 55%. Wall motion was normal; there were no regional wall motion abnormalities. Features are consistent with a pseudonormal left ventricular filling pattern, with concomitant abnormal relaxation and increased filling pressure (grade 2 diastolic dysfunction). - Aortic valve: Mildly calcified annulus. Moderately thickened, mildly calcified leaflets. - Left atrium: The atrium was mildly dilated.  CTA neck  06/20/2015 Atherosclerosis results in approximately 65% stenosis proximal RIGHT internal carotid artery. Less than 50% stenosis LEFT internal carotid artery origin. Mild stenosis of LEFT vertebral artery origin.  Component     Latest Ref Rng 06/19/2015  Cholesterol     0 - 200 mg/dL 123  Triglycerides     <150 mg/dL 61  HDL Cholesterol     >40 mg/dL 46  Total CHOL/HDL Ratio      2.7  VLDL     0 - 40 mg/dL 12  LDL (calc)     0 - 99 mg/dL 65  Hemoglobin A1C     4.8 - 5.6 % 6.2 (H)  Mean Plasma Glucose      131    Assessment: As you may recall, he is a 70 y.o. Caucasian male with PMH of CAD, HTN, Diabetes, HLD, former smokes was admitted on 06/18/15 for 2 episodes of of L arm weakness.  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 MRA unremarkable. TTE EF 50-55%. LDL 65 and A1C 6.2. But CTA neck showed right ICA 65% stenosis and soft plaque, likely the source of emboli. VVS consulted and he was put on DAPT for 3 months along with continuing lipitor. Had right CEA with Dr.Brabham on 07/03/2015 with patch angioplasty. Post op he is doing well. Off plavix and now on ASA 81.  Plan:  - continue ASA and lipitor for stroke prevention - check BP and glucose at home - follow up with Dr. Einar Gip and Dr. Trula Slade as scheduled - Follow up with your primary care physician for stroke risk factor modification. Recommend maintain blood pressure goal 130/80, diabetes with hemoglobin A1c goal below 6.5% and lipids with LDL cholesterol goal below 70 mg/dL.  - healthy diet and regular exercise  - follow up in 6 months.  I spent more than 25 minutes of face to face time with the patient. Greater than 50% of time was spent in counseling and coordination of care. We discussed medication compliance, continued follow up with  cardiology and VVS, risk factor modification.   No orders of the defined types were placed in this encounter.    No orders of the defined types were placed in this encounter.    Patient Instructions  - continue ASA and lipitor for stroke prevention - check BP and glucose at home - follow up with Dr. Einar Gip and Dr. Trula Slade as scheduled - Follow up with your primary care physician for stroke risk factor modification. Recommend maintain blood pressure goal 130/80, diabetes with hemoglobin A1c goal below 6.5% and lipids with LDL cholesterol goal below 70 mg/dL.  - healthy diet and regular exercise  - follow up in 6 months.     [name], MD PhD [facility] [address] [place], [place] [address] [phone]

## 2015-08-20 NOTE — Patient Instructions (Signed)
-   continue ASA and lipitor for stroke prevention - check BP and glucose at home - follow up with Dr. Einar Gip and Dr. Trula Slade as scheduled - Follow up with your primary care physician for stroke risk factor modification. Recommend maintain blood pressure goal 130/80, diabetes with hemoglobin A1c goal below 6.5% and lipids with LDL cholesterol goal below 70 mg/dL.  - healthy diet and regular exercise  - follow up in 6 months.

## 2015-09-13 DIAGNOSIS — C61 Malignant neoplasm of prostate: Secondary | ICD-10-CM | POA: Diagnosis not present

## 2015-09-20 DIAGNOSIS — C61 Malignant neoplasm of prostate: Secondary | ICD-10-CM | POA: Diagnosis not present

## 2015-10-08 DIAGNOSIS — Z85828 Personal history of other malignant neoplasm of skin: Secondary | ICD-10-CM | POA: Diagnosis not present

## 2015-10-30 DIAGNOSIS — I2510 Atherosclerotic heart disease of native coronary artery without angina pectoris: Secondary | ICD-10-CM | POA: Diagnosis not present

## 2015-10-30 DIAGNOSIS — Z951 Presence of aortocoronary bypass graft: Secondary | ICD-10-CM | POA: Diagnosis not present

## 2015-10-30 DIAGNOSIS — Z8673 Personal history of transient ischemic attack (TIA), and cerebral infarction without residual deficits: Secondary | ICD-10-CM | POA: Diagnosis not present

## 2015-10-30 DIAGNOSIS — Z9889 Other specified postprocedural states: Secondary | ICD-10-CM | POA: Diagnosis not present

## 2016-01-16 DIAGNOSIS — I739 Peripheral vascular disease, unspecified: Secondary | ICD-10-CM | POA: Diagnosis not present

## 2016-01-16 DIAGNOSIS — E118 Type 2 diabetes mellitus with unspecified complications: Secondary | ICD-10-CM | POA: Diagnosis not present

## 2016-01-16 DIAGNOSIS — I10 Essential (primary) hypertension: Secondary | ICD-10-CM | POA: Diagnosis not present

## 2016-01-28 ENCOUNTER — Encounter: Payer: Self-pay | Admitting: Surgery

## 2016-01-30 ENCOUNTER — Encounter: Payer: Self-pay | Admitting: Surgery

## 2016-02-03 ENCOUNTER — Encounter: Payer: Self-pay | Admitting: Surgery

## 2016-02-03 ENCOUNTER — Ambulatory Visit (HOSPITAL_COMMUNITY)
Admission: RE | Admit: 2016-02-03 | Discharge: 2016-02-03 | Disposition: A | Discharge status: Home / Self Care | Payer: Medicare Other | Source: Ambulatory Visit | Attending: Surgery | Admitting: Surgery

## 2016-02-03 ENCOUNTER — Ambulatory Visit (INDEPENDENT_AMBULATORY_CARE_PROVIDER_SITE_OTHER): Payer: Medicare Other | Admitting: Surgery

## 2016-02-03 VITALS — BP 125/69 | HR 68 | Temp 97.8°F | Resp 16 | Ht 74.0 in | Wt 228.1 lb

## 2016-02-03 DIAGNOSIS — Z48812 Encounter for surgical aftercare following surgery on the circulatory system: Secondary | ICD-10-CM | POA: Diagnosis not present

## 2016-02-03 DIAGNOSIS — I6523 Occlusion and stenosis of bilateral carotid arteries: Secondary | ICD-10-CM | POA: Insufficient documentation

## 2016-02-03 DIAGNOSIS — I639 Cerebral infarction, unspecified: Secondary | ICD-10-CM | POA: Diagnosis not present

## 2016-02-03 DIAGNOSIS — I6521 Occlusion and stenosis of right carotid artery: Secondary | ICD-10-CM

## 2016-02-03 LAB — VAS US CAROTID
LEFT ECA DIAS: -4 cm/s
LEFT VERTEBRAL DIAS: -9 cm/s
Left CCA dist dias: 14 cm/s
Left CCA dist sys: 69 cm/s
Left CCA prox dias: 16 cm/s
Left CCA prox sys: 164 cm/s
Left ICA dist dias: -26 cm/s
Left ICA dist sys: -95 cm/s
RIGHT CCA MID DIAS: 17 cm/s
RIGHT ECA DIAS: -15 cm/s
RIGHT VERTEBRAL DIAS: 16 cm/s
Right CCA prox dias: 14 cm/s
Right CCA prox sys: 131 cm/s
Right cca dist sys: -109 cm/s

## 2016-02-03 NOTE — Progress Notes (Signed)
[facility] [place]  Patient name: [name] MRN: [id number] DOB: 1945/05/20 Sex: male  REASON FOR VISIT: follow-up  HPI: [name] is a 70 y.o. male returns today for follow-up.  He is status post right carotid endarterectomy on 07/03/2015.  This was done for symptomatic carotid stenosis.  The patient had a right brain stroke prior to his operation with left arm weakness.  He is nearly back to his baseline.  Intraoperative findings included a ulcerated plaque at the bifurcation with approximate 75% stenosis.  The patient states that his left arm weakness has nearly resolved.  He has no complaints today.  Past Medical History:  Diagnosis Date  . Anxiety   . ASCVD (arteriosclerotic cardiovascular disease)    03/2004: DES x2 to the RCA; IMI with stent occlusion in 02/2005- suboptimal Plavix compliance  . CAD (coronary artery disease)   . Carcinoma of prostate ([place])    Clopidogrel held for biopsy  . Constipation due to pain medication   . Diabetes mellitus without complication ([place])   . Diabetic neuropathy ([place])   . Hyperlipidemia   . Hypertension   . Mild depression ([place])   . Morbid obesity ([place])   . Myocardial infarction   . Shortness of breath dyspnea   . Stroke ([place])   . Tobacco abuse    stopped smoking 06/28/09    Family History  Problem Relation Age of Onset  . Kidney disease Mother   . Stroke Father   . Colon cancer Neg Hx     SOCIAL HISTORY: Social History  Substance Use Topics  . Smoking status: Former Smoker    Types: Cigarettes  . Smokeless tobacco: Former Systems developer    Quit date: 06/28/2009  . Alcohol use 1.8 oz/week    2 Glasses of wine, 1 Cans of beer per week     Comment: Occasionally    No Known Allergies  Current Outpatient Prescriptions  Medication Sig Dispense Refill  . amLODipine (NORVASC) 10 MG tablet Take 1 tablet (10 mg total) by mouth daily. 30 tablet 11  . aspirin EC 81 MG  tablet Take 81 mg by mouth daily.    Marland Kitchen atorvastatin (LIPITOR) 20 MG tablet Take 20 mg by mouth daily.    . metFORMIN (GLUMETZA) 1000 MG (MOD) 24 hr tablet Take 1,000 mg by mouth daily with breakfast.    . oxyCODONE-acetaminophen (PERCOCET/ROXICET) 5-325 MG tablet Take 1-2 tablets by mouth every 4 (four) hours as needed for moderate pain. (Patient not taking: Reported on 02/03/2016) 20 tablet 0   No current facility-administered medications for this visit.     REVIEW OF SYSTEMS:  [X]  denotes positive finding, [ ]  denotes negative finding Cardiac  Comments:  Chest pain or chest pressure:    Shortness of breath upon exertion:    Short of breath when lying flat:    Irregular heart rhythm:        Vascular    Pain in calf, thigh, or hip brought on by ambulation: x   Pain in feet at night that wakes you up from your sleep:     Blood clot in your veins:    Leg swelling:         Pulmonary    Oxygen at home:    Productive cough:     Wheezing:         Neurologic    Sudden weakness in arms or legs:     Sudden numbness in arms or legs:  Sudden onset of difficulty speaking or slurred speech:    Temporary loss of vision in one eye:     Problems with dizziness:         Gastrointestinal    Blood in stool:     Vomited blood:         Genitourinary    Burning when urinating:     Blood in urine:        Psychiatric    Major depression:         Hematologic    Bleeding problems:    Problems with blood clotting too easily:        Skin    Rashes or ulcers:        Constitutional    Fever or chills:      PHYSICAL EXAM: Vitals:   02/03/16 1506 02/03/16 1507  BP: 120/69 125/69  Pulse: 68   Resp: 16   Temp: 97.8 F (36.6 C)   TempSrc: Oral   SpO2: 96%   Weight: 228 lb 1.6 oz (103.5 kg)   Height: 6\' 2"  (1.88 m)     GENERAL: The patient is a well-nourished male, in no acute distress. The vital signs are documented above. CARDIAC: There is a regular rate and rhythm PULMONARY:  There is good air exchange bilaterally without wheezing or rales. MUSCULOSKELETAL: There are no major deformities or cyanosis. NEUROLOGIC: No focal weakness or paresthesias are detected. SKIN: There are no ulcers or rashes noted. PSYCHIATRIC: The patient has a normal affect.  DATA:  Carotid duplex was ordered and reviewed.  The right carotid endarterectomy site is widely patent without evidence of restenosis.  There is less than 40% stenosis of the left internal carotid artery  MEDICAL ISSUES: Status post right carotid endarterectomy: The patient is doing very well at this time.  I have scheduled for follow-up in 1 year with a repeat carotid duplex.    Annamarie Major, MD [facility] [phone] Pager [phone]

## 2016-02-05 NOTE — Addendum Note (Signed)
Addended by: Kaleen Mask on: 02/05/2016 11:07 AM   Modules accepted: Orders

## 2016-02-20 ENCOUNTER — Encounter: Payer: Self-pay | Admitting: Neurology

## 2016-02-20 ENCOUNTER — Ambulatory Visit (INDEPENDENT_AMBULATORY_CARE_PROVIDER_SITE_OTHER): Payer: Medicare Other | Admitting: Neurology

## 2016-02-20 VITALS — BP 131/79 | HR 64 | Ht 74.0 in | Wt 228.0 lb

## 2016-02-20 DIAGNOSIS — I63231 Cerebral infarction due to unspecified occlusion or stenosis of right carotid arteries: Secondary | ICD-10-CM

## 2016-02-20 DIAGNOSIS — I10 Essential (primary) hypertension: Secondary | ICD-10-CM

## 2016-02-20 DIAGNOSIS — I6521 Occlusion and stenosis of right carotid artery: Secondary | ICD-10-CM

## 2016-02-20 DIAGNOSIS — I639 Cerebral infarction, unspecified: Secondary | ICD-10-CM | POA: Diagnosis not present

## 2016-02-20 DIAGNOSIS — Z9889 Other specified postprocedural states: Secondary | ICD-10-CM

## 2016-02-20 DIAGNOSIS — E785 Hyperlipidemia, unspecified: Secondary | ICD-10-CM | POA: Diagnosis not present

## 2016-02-20 NOTE — Patient Instructions (Signed)
-   continue ASA and lipitor for stroke prevention - check BP and glucose at home and record - follow up with Dr. Einar Gip and Dr. Trula Slade as scheduled - Follow up with your primary care physician for stroke risk factor modification. Recommend maintain blood pressure goal 130/80, diabetes with hemoglobin A1c goal below 6.5% and lipids with LDL cholesterol goal below 70 mg/dL.  - healthy diet and regular exercise  - follow up in one year.

## 2016-02-20 NOTE — Progress Notes (Signed)
STROKE NEUROLOGY FOLLOW UP NOTE  NAME: [name] DOB: 01/03/1946  REASON FOR VISIT: stroke follow up HISTORY FROM: pt and chart  Today we had the pleasure of seeing [name] in follow-up at our Neurology Clinic. Pt was accompanied by wife.   History Summary [name] is a 70 y.o. male with history of CAD, HTN, Diabetes, HLD, former smokes was admitted on 06/18/15 for 2 episodes of of L arm weakness. [name] did not receive IV t-PA due to resolved symptoms. 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 MRA unremarkable. TTE EF 50-55%. LDL 65 and A1C 6.2. But CTA neck showed right ICA 65% stenosis and soft plaque, likely the source of emboli. VVS consulted and [name] was put on DAPT for 3 months along with continuing lipitor.   08/20/15 follow up - the patient has been doing well. Had right CEA with Dr.Brabham on 07/03/2015 with patch angioplasty. Intraoperative findings included approximate 75% stenosis with an ulcerated area at the carotid bifurcation. Post op [name] is doing well. Off plavix and now on ASA 81.   Interval History During the interval time, pt has been doing well. Had follow up with Dr. Trula Slade on 02/03/16 and doing well. Repeat CUS showed right ICA widely patent and left ICA < 40% stenosis. Will repeat CUS in one year. BP at home somehow high at 160s, but today 131/79. Glucose controlled well.   REVIEW OF SYSTEMS: Full 14 system review of systems performed and notable only for those listed below and in HPI above, all others are negative:  Constitutional:   Cardiovascular:  Ear/Nose/Throat:   Skin:  Eyes:   Respiratory:   Gastroitestinal:   Genitourinary:  Hematology/Lymphatic:   Endocrine:  Musculoskeletal:   Allergy/Immunology:   Neurological:   Psychiatric:  Sleep:   The following represents the patient's updated allergies and side effects list: No Known Allergies  The neurologically relevant items on the patient's problem list were  reviewed on today's visit.  Neurologic Examination  A problem focused neurological exam (12 or more points of the single system neurologic examination, vital signs counts as 1 point, cranial nerves count for 8 points) was performed.  Blood pressure 131/79, pulse 64, height 6\' 2"  (1.88 m), weight 228 lb (103.4 kg).  General - Well nourished, well developed, in no apparent distress.  Ophthalmologic - Fundi not visualized due to noncooperation.  Cardiovascular - Regular rate and rhythm.  Mental Status -  Level of arousal and orientation to time, place, and person were intact. Language including expression, naming, repetition, comprehension was assessed and found intact. Fund of Knowledge was assessed and was intact.  Cranial Nerves II - XII - II - Visual field intact OU. III, IV, VI - Extraocular movements intact. V - Facial sensation intact bilaterally. VII - Facial movement intact bilaterally. VIII - Hearing & vestibular intact bilaterally. X - Palate elevates symmetrically. XI - Chin turning & shoulder shrug intact bilaterally. XII - Tongue protrusion intact.  Motor Strength - The patient's strength was normal in all extremities and pronator drift was absent.  Bulk was normal and fasciculations were absent.   Motor Tone - Muscle tone was assessed at the neck and appendages and was normal.  Reflexes - The patient's reflexes were 1+ in all extremities and [name] had no pathological reflexes.  Sensory - Light touch, temperature/pinprick were assessed and were normal.    Coordination - The patient had normal movements in the hands and feet with no ataxia  or dysmetria.  Tremor was absent.  Gait and Station - The patient's transfers, posture, gait, station, and turns were observed as normal.   Data reviewed: I personally reviewed the images and agree with the radiology interpretations.  Ct Head Wo Contrast 06/18/2015  Study within normal limits.   Mri brain and Mra head Wo  Contrast 06/19/2015  1. Punctate acute infarct in the right parietal cortex.  2. No prior infarct or white matter disease.  3. Negative intracranial MRA.   CUS Done 04/2015 by Dr. Einar Gip - right ICA 16-49% stenosis   TTE  06/20/2015 Study Conclusions - Left ventricle: The cavity size was normal. Wall thickness was normal. Systolic function was normal. The estimated ejection fraction was in the range of 50% to 55%. Wall motion was normal; there were no regional wall motion abnormalities. Features are consistent with a pseudonormal left ventricular filling pattern, with concomitant abnormal relaxation and increased filling pressure (grade 2 diastolic dysfunction). - Aortic valve: Mildly calcified annulus. Moderately thickened, mildly calcified leaflets. - Left atrium: The atrium was mildly dilated.  CTA neck  06/20/2015 Atherosclerosis results in approximately 65% stenosis proximal RIGHT internal carotid artery. Less than 50% stenosis LEFT internal carotid artery origin. Mild stenosis of LEFT vertebral artery origin.  CUS 02/03/16 The right carotid endarterectomy site is widely patent without evidence of restenosis.  There is less than 40% stenosis of the left internal carotid artery  Component     Latest Ref Rng 06/19/2015  Cholesterol     0 - 200 mg/dL 123  Triglycerides     <150 mg/dL 61  HDL Cholesterol     >40 mg/dL 46  Total CHOL/HDL Ratio      2.7  VLDL     0 - 40 mg/dL 12  LDL (calc)     0 - 99 mg/dL 65  Hemoglobin A1C     4.8 - 5.6 % 6.2 (H)  Mean Plasma Glucose      131    Assessment: As you may recall, [name] is a 70 y.o. Caucasian male with PMH of CAD, HTN, Diabetes, HLD, former smokes was admitted on 06/18/15 for 2 episodes of of L arm weakness. 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 MRA unremarkable. TTE EF 50-55%. LDL 65 and A1C 6.2. But CTA neck showed right ICA 65% stenosis and soft plaque, likely the source of emboli. VVS  consulted and [name] was put on DAPT for 3 months along with continuing lipitor. Had right CEA with Dr.Brabham on 07/03/2015 with patch angioplasty. Post op [name] is doing well. Off plavix and now on ASA 81. Followed with VVS and repeat CUS unremarkable. Will repeat CUS in one year. BP and glucose in control.  Plan:  - continue ASA and lipitor for stroke prevention - check BP and glucose at home and record - follow up with Dr. Einar Gip and Dr. Trula Slade as scheduled - Follow up with your primary care physician for stroke risk factor modification. Recommend maintain blood pressure goal 130/80, diabetes with hemoglobin A1c goal below 6.5% and lipids with LDL cholesterol goal below 70 mg/dL.  - healthy diet and regular exercise  - follow up in one year.   No orders of the defined types were placed in this encounter.   Meds ordered this encounter  Medications  . losartan-hydrochlorothiazide (HYZAAR) 50-12.5 MG tablet    Patient Instructions  - continue ASA and lipitor for stroke prevention - check BP and glucose at home and record - follow up  with Dr. Einar Gip and Dr. Trula Slade as scheduled - Follow up with your primary care physician for stroke risk factor modification. Recommend maintain blood pressure goal 130/80, diabetes with hemoglobin A1c goal below 6.5% and lipids with LDL cholesterol goal below 70 mg/dL.  - healthy diet and regular exercise  - follow up in one year.   [name], MD PhD [facility] [address] [place], [place] [address] [phone]

## 2016-03-10 DIAGNOSIS — Z85828 Personal history of other malignant neoplasm of skin: Secondary | ICD-10-CM | POA: Diagnosis not present

## 2016-03-20 DIAGNOSIS — C61 Malignant neoplasm of prostate: Secondary | ICD-10-CM | POA: Diagnosis not present

## 2016-04-27 DIAGNOSIS — Z23 Encounter for immunization: Secondary | ICD-10-CM | POA: Diagnosis not present

## 2016-05-20 DIAGNOSIS — C61 Malignant neoplasm of prostate: Secondary | ICD-10-CM | POA: Diagnosis not present

## 2016-05-28 DIAGNOSIS — I10 Essential (primary) hypertension: Secondary | ICD-10-CM | POA: Diagnosis not present

## 2016-05-28 DIAGNOSIS — E118 Type 2 diabetes mellitus with unspecified complications: Secondary | ICD-10-CM | POA: Diagnosis not present

## 2016-05-28 DIAGNOSIS — I2510 Atherosclerotic heart disease of native coronary artery without angina pectoris: Secondary | ICD-10-CM | POA: Diagnosis not present

## 2016-05-29 DIAGNOSIS — I2510 Atherosclerotic heart disease of native coronary artery without angina pectoris: Secondary | ICD-10-CM | POA: Diagnosis not present

## 2016-05-29 DIAGNOSIS — E7800 Pure hypercholesterolemia, unspecified: Secondary | ICD-10-CM | POA: Diagnosis not present

## 2016-05-29 DIAGNOSIS — E118 Type 2 diabetes mellitus with unspecified complications: Secondary | ICD-10-CM | POA: Diagnosis not present

## 2016-05-29 DIAGNOSIS — I10 Essential (primary) hypertension: Secondary | ICD-10-CM | POA: Diagnosis not present

## 2016-06-16 DIAGNOSIS — I10 Essential (primary) hypertension: Secondary | ICD-10-CM | POA: Diagnosis not present

## 2016-06-16 DIAGNOSIS — I252 Old myocardial infarction: Secondary | ICD-10-CM | POA: Diagnosis not present

## 2016-06-16 DIAGNOSIS — I2510 Atherosclerotic heart disease of native coronary artery without angina pectoris: Secondary | ICD-10-CM | POA: Diagnosis not present

## 2016-06-16 DIAGNOSIS — Z7984 Long term (current) use of oral hypoglycemic drugs: Secondary | ICD-10-CM | POA: Diagnosis not present

## 2016-06-16 DIAGNOSIS — E119 Type 2 diabetes mellitus without complications: Secondary | ICD-10-CM | POA: Diagnosis not present

## 2016-06-25 DIAGNOSIS — E118 Type 2 diabetes mellitus with unspecified complications: Secondary | ICD-10-CM | POA: Diagnosis not present

## 2016-06-25 DIAGNOSIS — E7800 Pure hypercholesterolemia, unspecified: Secondary | ICD-10-CM | POA: Diagnosis not present

## 2016-06-25 DIAGNOSIS — I10 Essential (primary) hypertension: Secondary | ICD-10-CM | POA: Diagnosis not present

## 2016-08-04 DIAGNOSIS — M62838 Other muscle spasm: Secondary | ICD-10-CM | POA: Diagnosis not present

## 2016-09-24 DIAGNOSIS — E118 Type 2 diabetes mellitus with unspecified complications: Secondary | ICD-10-CM | POA: Diagnosis not present

## 2016-09-24 DIAGNOSIS — I10 Essential (primary) hypertension: Secondary | ICD-10-CM | POA: Diagnosis not present

## 2016-09-24 DIAGNOSIS — I2510 Atherosclerotic heart disease of native coronary artery without angina pectoris: Secondary | ICD-10-CM | POA: Diagnosis not present

## 2016-09-24 DIAGNOSIS — R0609 Other forms of dyspnea: Secondary | ICD-10-CM | POA: Diagnosis not present

## 2016-09-25 DIAGNOSIS — I2510 Atherosclerotic heart disease of native coronary artery without angina pectoris: Secondary | ICD-10-CM | POA: Diagnosis not present

## 2016-09-25 DIAGNOSIS — E7800 Pure hypercholesterolemia, unspecified: Secondary | ICD-10-CM | POA: Diagnosis not present

## 2016-10-27 DIAGNOSIS — I10 Essential (primary) hypertension: Secondary | ICD-10-CM | POA: Diagnosis not present

## 2016-10-27 DIAGNOSIS — R0602 Shortness of breath: Secondary | ICD-10-CM | POA: Diagnosis not present

## 2016-10-27 DIAGNOSIS — I2510 Atherosclerotic heart disease of native coronary artery without angina pectoris: Secondary | ICD-10-CM | POA: Diagnosis not present

## 2016-10-30 DIAGNOSIS — I10 Essential (primary) hypertension: Secondary | ICD-10-CM | POA: Diagnosis not present

## 2016-10-30 DIAGNOSIS — I2510 Atherosclerotic heart disease of native coronary artery without angina pectoris: Secondary | ICD-10-CM | POA: Diagnosis not present

## 2016-10-30 DIAGNOSIS — R0602 Shortness of breath: Secondary | ICD-10-CM | POA: Diagnosis not present

## 2016-11-09 DIAGNOSIS — I2510 Atherosclerotic heart disease of native coronary artery without angina pectoris: Secondary | ICD-10-CM | POA: Diagnosis not present

## 2016-11-09 DIAGNOSIS — I10 Essential (primary) hypertension: Secondary | ICD-10-CM | POA: Diagnosis not present

## 2016-11-09 DIAGNOSIS — R0609 Other forms of dyspnea: Secondary | ICD-10-CM | POA: Diagnosis not present

## 2016-11-09 DIAGNOSIS — E118 Type 2 diabetes mellitus with unspecified complications: Secondary | ICD-10-CM | POA: Diagnosis not present

## 2016-11-11 DIAGNOSIS — C61 Malignant neoplasm of prostate: Secondary | ICD-10-CM | POA: Diagnosis not present

## 2016-11-18 DIAGNOSIS — C61 Malignant neoplasm of prostate: Secondary | ICD-10-CM | POA: Diagnosis not present

## 2016-11-19 DIAGNOSIS — R0681 Apnea, not elsewhere classified: Secondary | ICD-10-CM | POA: Diagnosis not present

## 2016-12-14 DIAGNOSIS — G459 Transient cerebral ischemic attack, unspecified: Secondary | ICD-10-CM | POA: Diagnosis not present

## 2016-12-14 DIAGNOSIS — Z8546 Personal history of malignant neoplasm of prostate: Secondary | ICD-10-CM | POA: Diagnosis not present

## 2016-12-14 DIAGNOSIS — I2510 Atherosclerotic heart disease of native coronary artery without angina pectoris: Secondary | ICD-10-CM | POA: Diagnosis not present

## 2016-12-14 DIAGNOSIS — Z9889 Other specified postprocedural states: Secondary | ICD-10-CM | POA: Diagnosis not present

## 2016-12-14 DIAGNOSIS — I252 Old myocardial infarction: Secondary | ICD-10-CM | POA: Diagnosis not present

## 2016-12-14 DIAGNOSIS — I10 Essential (primary) hypertension: Secondary | ICD-10-CM | POA: Diagnosis not present

## 2016-12-14 DIAGNOSIS — I6523 Occlusion and stenosis of bilateral carotid arteries: Secondary | ICD-10-CM | POA: Diagnosis not present

## 2016-12-14 DIAGNOSIS — E119 Type 2 diabetes mellitus without complications: Secondary | ICD-10-CM | POA: Diagnosis not present

## 2016-12-14 DIAGNOSIS — M48061 Spinal stenosis, lumbar region without neurogenic claudication: Secondary | ICD-10-CM | POA: Diagnosis not present

## 2016-12-14 DIAGNOSIS — Z0000 Encounter for general adult medical examination without abnormal findings: Secondary | ICD-10-CM | POA: Diagnosis not present

## 2016-12-14 DIAGNOSIS — Z23 Encounter for immunization: Secondary | ICD-10-CM | POA: Diagnosis not present

## 2016-12-28 DIAGNOSIS — M5136 Other intervertebral disc degeneration, lumbar region: Secondary | ICD-10-CM | POA: Diagnosis not present

## 2016-12-28 DIAGNOSIS — M21371 Foot drop, right foot: Secondary | ICD-10-CM | POA: Diagnosis not present

## 2016-12-30 DIAGNOSIS — G4733 Obstructive sleep apnea (adult) (pediatric): Secondary | ICD-10-CM | POA: Diagnosis not present

## 2016-12-31 ENCOUNTER — Ambulatory Visit: Payer: PRIVATE HEALTH INSURANCE | Admitting: Podiatry

## 2016-12-31 ENCOUNTER — Encounter: Payer: Self-pay | Admitting: Podiatry

## 2016-12-31 ENCOUNTER — Ambulatory Visit (INDEPENDENT_AMBULATORY_CARE_PROVIDER_SITE_OTHER): Payer: Medicare Other | Admitting: Podiatry

## 2016-12-31 ENCOUNTER — Ambulatory Visit: Payer: Medicare Other

## 2016-12-31 DIAGNOSIS — B351 Tinea unguium: Secondary | ICD-10-CM | POA: Diagnosis not present

## 2016-12-31 DIAGNOSIS — M79673 Pain in unspecified foot: Secondary | ICD-10-CM | POA: Diagnosis not present

## 2016-12-31 DIAGNOSIS — E1151 Type 2 diabetes mellitus with diabetic peripheral angiopathy without gangrene: Secondary | ICD-10-CM | POA: Diagnosis not present

## 2016-12-31 DIAGNOSIS — B353 Tinea pedis: Secondary | ICD-10-CM | POA: Diagnosis not present

## 2016-12-31 DIAGNOSIS — E7800 Pure hypercholesterolemia, unspecified: Secondary | ICD-10-CM | POA: Diagnosis not present

## 2016-12-31 DIAGNOSIS — G4733 Obstructive sleep apnea (adult) (pediatric): Secondary | ICD-10-CM | POA: Diagnosis not present

## 2016-12-31 MED ORDER — NONFORMULARY OR COMPOUNDED ITEM: 0 refills | Status: DC

## 2016-12-31 NOTE — Patient Instructions (Signed)

## 2016-12-31 NOTE — Progress Notes (Signed)
Subjective:    Patient ID: [name], male    DOB: [birth year], 71 y.o.   MRN: [id number]  HPI  Chief Complaint  Patient presents with  . Skin Problem    Right, between toes, red,itchy rash.   . Diabetes    Debride    71 y.o. male presents with the above complaint. Reports DM, didn't check sugars this AM, unsure of last A1c. Reports elongated nails to both feet. Reports rash that is very itchy between his R 3rd and 4th toes. Has tried OTC medications without relief. Not improving. Denies other issues.  Past Medical History:  Diagnosis Date  . Anxiety   . ASCVD (arteriosclerotic cardiovascular disease)    03/2004: DES x2 to the RCA; IMI with stent occlusion in 02/2005- suboptimal Plavix compliance  . CAD (coronary artery disease)   . Carcinoma of prostate ([place])    Clopidogrel held for biopsy  . Constipation due to pain medication   . Diabetes mellitus without complication ([place])   . Diabetic neuropathy ([place])   . Hyperlipidemia   . Hypertension   . Mild depression ([place])   . Morbid obesity ([place])   . Myocardial infarction ([place])   . Shortness of breath dyspnea   . Stroke ([place])   . Tobacco abuse    stopped smoking 06/28/09   Past Surgical History:  Procedure Laterality Date  . BACK SURGERY    . CARDIAC CATHETERIZATION     X 1 stent before having CABG  . CORONARY ARTERY BYPASS GRAFT  06/28/2008   X4  . ENDARTERECTOMY Right 07/03/2015   Procedure: RIGHT CAROTID ENDARTERECTOMY WITH BOVINE PERICARDIUM PATCH ANGIOPLASTY;  Surgeon: Serafina Mitchell, MD;  Location: [facility];  Service: Vascular;  Laterality: Right;  . LUMBAR LAMINECTOMY/DECOMPRESSION MICRODISCECTOMY N/A 09/06/2014   Procedure: LUMBER DECOMPRESSION L3-5;  Surgeon: Melina Schools, MD;  Location: [facility];  Service: Orthopedics;  Laterality: N/A;  . LUMBAR SPINE SURGERY  2002  . PROSTATECTOMY  02/2007  . VASECTOMY      Current Outpatient Prescriptions:  .  amLODipine (NORVASC) 10 MG tablet, Take 1 tablet (10 mg total)  by mouth daily., Disp: 30 tablet, Rfl: 11 .  aspirin EC 81 MG tablet, Take 81 mg by mouth daily., Disp: , Rfl:  .  atorvastatin (LIPITOR) 80 MG tablet, TK 1 T PO DAILY, Disp: , Rfl: 4 .  losartan (COZAAR) 100 MG tablet, Take 100 mg by mouth daily., Disp: , Rfl:  .  metFORMIN (GLUCOPHAGE) 500 MG tablet, TK 1/2 T PO DAILY, Disp: , Rfl: 0 .  metoprolol succinate (TOPROL-XL) 25 MG 24 hr tablet, TK 1 T PO D, Disp: , Rfl: 2 .  Naproxen Sodium (ALEVE PO), Take by mouth., Disp: , Rfl:  .  NONFORMULARY OR COMPOUNDED ITEM, [facility]  Naftifine HCL Cream/Gel - 1.5% or 3% Apply 1-2 grams to affected area 2 times daily Qty. 120 gm 3 refills, Disp: 1 each, Rfl: 0  Allergies  Allergen Reactions  . Lisinopril Cough     Review of Systems  Constitutional: Positive for fatigue.  Cardiovascular: Positive for leg swelling.       Calf pain with walking   Gastrointestinal: Positive for abdominal distention and constipation.  Musculoskeletal: Positive for arthralgias, back pain and gait problem.  Skin: Positive for rash.  All other systems reviewed and are negative.      Objective:   Physical Exam There were no vitals filed for this visit. General AA&O x3. Normal mood and  affect.  Vascular Dorsalis pedis and posterior tibial pulses 1+ and non-palpable bilaterally  Capillary refill normal to all digits. Pedal hair growth absent.  Neurologic Epicritic sensation grossly present.  Dermatologic No open lesions. R 3rd/4th toes with macerated interspace and erythematous, pruritic lesion. Nails elongated and thickened with black discoloration and subunual debris.  Orthopedic: MMT 5/5 in dorsiflexion, plantarflexion, inversion, and eversion. Normal joint ROM without pain or crepitus.     Assessment & Plan:  Diabetes with PAD, Onychomycosis -Educated on diabetic footcare. Diabetic risk level 1 -Nails x10 debrided sharply and manually with large nail nipper and rotary burr.  Tinea Pedis,  Inter[employer] -Rx for compounded Naftin gel for drying and anti-funal properties. Advised to use miconazole powder in his shoes.  Procedure: Nail Debridement Rationale: Patient meets criteria for routine foot care due to Class B findings Type of Debridement: manual, sharp debridement. Instrumentation: Nail nipper, rotary burr. Number of Nails: 10

## 2017-01-04 ENCOUNTER — Telehealth: Payer: Self-pay | Admitting: Podiatry

## 2017-01-04 MED ORDER — NONFORMULARY OR COMPOUNDED ITEM: 0 refills | Status: DC

## 2017-01-04 NOTE — Addendum Note (Signed)
Addended by: Harriett Sine D on: 01/04/2017 01:41 PM   Modules accepted: Orders

## 2017-01-04 NOTE — Telephone Encounter (Signed)
Faxed orders to [facility]. I informed pt the rx was being sent as we spoke an apologized for the delay.

## 2017-01-04 NOTE — Telephone Encounter (Signed)
Pt called saying he called [facility] and they did not receive our Rx that Dr. March Rummage ordered on Thursday 13 September.

## 2017-01-12 DIAGNOSIS — R0609 Other forms of dyspnea: Secondary | ICD-10-CM | POA: Diagnosis not present

## 2017-01-12 DIAGNOSIS — I10 Essential (primary) hypertension: Secondary | ICD-10-CM | POA: Diagnosis not present

## 2017-01-12 DIAGNOSIS — I2510 Atherosclerotic heart disease of native coronary artery without angina pectoris: Secondary | ICD-10-CM | POA: Diagnosis not present

## 2017-01-12 DIAGNOSIS — E118 Type 2 diabetes mellitus with unspecified complications: Secondary | ICD-10-CM | POA: Diagnosis not present

## 2017-01-28 ENCOUNTER — Ambulatory Visit: Payer: Medicare Other | Admitting: Podiatry

## 2017-02-09 ENCOUNTER — Ambulatory Visit (HOSPITAL_COMMUNITY)
Admission: RE | Admit: 2017-02-09 | Discharge: 2017-02-09 | Disposition: A | Discharge status: Home / Self Care | Payer: Medicare Other | Source: Ambulatory Visit | Attending: Family | Admitting: Family

## 2017-02-09 ENCOUNTER — Encounter: Payer: Self-pay | Admitting: Family

## 2017-02-09 ENCOUNTER — Ambulatory Visit (INDEPENDENT_AMBULATORY_CARE_PROVIDER_SITE_OTHER): Payer: PRIVATE HEALTH INSURANCE | Admitting: Family

## 2017-02-09 VITALS — BP 155/87 | HR 65 | Temp 97.6°F | Resp 16 | Ht 74.0 in | Wt 232.0 lb

## 2017-02-09 DIAGNOSIS — I6521 Occlusion and stenosis of right carotid artery: Secondary | ICD-10-CM

## 2017-02-09 DIAGNOSIS — Z9889 Other specified postprocedural states: Secondary | ICD-10-CM

## 2017-02-09 LAB — VAS US CAROTID
LEFT ECA DIAS: -38 cm/s
LEFT VERTEBRAL DIAS: 12 cm/s
Left CCA dist dias: 17 cm/s
Left CCA dist sys: 56 cm/s
Left CCA prox dias: 20 cm/s
Left CCA prox sys: 93 cm/s
Left ICA dist dias: -32 cm/s
Left ICA dist sys: -99 cm/s
Left ICA prox dias: -18 cm/s
Left ICA prox sys: -64 cm/s
RIGHT CCA MID DIAS: 20 cm/s
RIGHT ECA DIAS: -17 cm/s
RIGHT VERTEBRAL DIAS: 18 cm/s
Right CCA prox dias: 15 cm/s
Right CCA prox sys: 127 cm/s
Right cca dist sys: -102 cm/s

## 2017-02-09 NOTE — Patient Instructions (Signed)
Stroke Prevention Some medical conditions and behaviors are associated with an increased chance of having a stroke. You may prevent a stroke by making healthy choices and managing medical conditions. How can I reduce my risk of having a stroke?  Stay physically active. Get at least 30 minutes of activity on most or all days.  Do not smoke. It may also be helpful to avoid exposure to secondhand smoke.  Limit alcohol use. Moderate alcohol use is considered to be: ? No more than 2 drinks per day for men. ? No more than 1 drink per day for nonpregnant women.  Eat healthy foods. This involves: ? Eating 5 or more servings of fruits and vegetables a day. ? Making dietary changes that address high blood pressure (hypertension), high cholesterol, diabetes, or obesity.  Manage your cholesterol levels. ? Making food choices that are high in fiber and low in saturated fat, trans fat, and cholesterol may control cholesterol levels. ? Take any prescribed medicines to control cholesterol as directed by your health care provider.  Manage your diabetes. ? Controlling your carbohydrate and sugar intake is recommended to manage diabetes. ? Take any prescribed medicines to control diabetes as directed by your health care provider.  Control your hypertension. ? Making food choices that are low in salt (sodium), saturated fat, trans fat, and cholesterol is recommended to manage hypertension. ? Ask your health care provider if you need treatment to lower your blood pressure. Take any prescribed medicines to control hypertension as directed by your health care provider. ? If you are 18-39 years of age, have your blood pressure checked every 3-5 years. If you are 40 years of age or older, have your blood pressure checked every year.  Maintain a healthy weight. ? Reducing calorie intake and making food choices that are low in sodium, saturated fat, trans fat, and cholesterol are recommended to manage  weight.  Stop drug abuse.  Avoid taking birth control pills. ? Talk to your health care provider about the risks of taking birth control pills if you are over 35 years old, smoke, get migraines, or have ever had a blood clot.  Get evaluated for sleep disorders (sleep apnea). ? Talk to your health care provider about getting a sleep evaluation if you snore a lot or have excessive sleepiness.  Take medicines only as directed by your health care provider. ? For some people, aspirin or blood thinners (anticoagulants) are helpful in reducing the risk of forming abnormal blood clots that can lead to stroke. If you have the irregular heart rhythm of atrial fibrillation, you should be on a blood thinner unless there is a good reason you cannot take them. ? Understand all your medicine instructions.  Make sure that other conditions (such as anemia or atherosclerosis) are addressed. Get help right away if:  You have sudden weakness or numbness of the face, arm, or leg, especially on one side of the body.  Your face or eyelid droops to one side.  You have sudden confusion.  You have trouble speaking (aphasia) or understanding.  You have sudden trouble seeing in one or both eyes.  You have sudden trouble walking.  You have dizziness.  You have a loss of balance or coordination.  You have a sudden, severe headache with no known cause.  You have new chest pain or an irregular heartbeat. Any of these symptoms may represent a serious problem that is an emergency. Do not wait to see if the symptoms will go away.   Get medical help at once. Call your local emergency services (911 in [place] Do not drive yourself to the hospital. This information is not intended to replace advice given to you by your health care provider. Make sure you discuss any questions you have with your health care provider. Document Released: 05/14/2004 Document Revised: 09/12/2015 Document Reviewed: 10/07/2012 Elsevier  Interactive Patient Education  2017 Elsevier Inc.     Preventing Cerebrovascular Disease Arteries are blood vessels that carry blood that contains oxygen from the heart to all parts of the body. Cerebrovascular disease affects arteries that supply the brain. Any condition that blocks or disrupts blood flow to the brain can cause cerebrovascular disease. Brain cells that lose blood supply start to die within minutes (stroke). Stroke is the main danger of cerebrovascular disease. Atherosclerosis and high blood pressure are common causes of cerebrovascular disease. Atherosclerosis is narrowing and hardening of an artery that results when fat, cholesterol, calcium, or other substances (plaque) build up inside an artery. Plaque reduces blood flow through the artery. High blood pressure increases the risk of bleeding inside the brain. Making diet and lifestyle changes to prevent atherosclerosis and high blood pressure lowers your risk of cerebrovascular disease. What nutrition changes can be made?  Eat more fruits, vegetables, and whole grains.  Reduce how much saturated fat you eat. To do this, eat less red meat and fewer full-fat dairy products.  Eat healthy proteins instead of red meat. Healthy proteins include: ? Fish. Eat fish that contains heart-healthy omega-3 fatty acids, twice a week. Examples include salmon, albacore tuna, mackerel, and herring. ? Chicken. ? Nuts. ? Low-fat or nonfat yogurt.  Avoid processed meats, like bacon and lunchmeat.  Avoid foods that contain: ? A lot of sugar, such as sweets and drinks with added sugar. ? A lot of salt (sodium). Avoid adding extra salt to your food, as told by your health care provider. ? Trans fats, such as margarine and baked goods. Trans fats may be listed as "partially hydrogenated oils" on food labels.  Check food labels to see how much sodium, sugar, and trans fats are in foods.  Use vegetable oils that contain low amounts of  saturated fat, such as olive oil or canola oil. What lifestyle changes can be made?  Drink alcohol in moderation. This means no more than 1 drink a day for nonpregnant women and 2 drinks a day for men. One drink equals 12 oz of beer, 5 oz of wine, or 1 oz of hard liquor.  If you are overweight, ask your health care provider to recommend a weight-loss plan for you. Losing 5-10 lb (2.2-4.5 kg) can reduce your risk of diabetes, atherosclerosis, and high blood pressure.  Exercise for 30?60 minutes on most days, or as much as told by your health care provider. ? Do moderate-intensity exercise, such as brisk walking, bicycling, and water aerobics. Ask your health care provider which activities are safe for you.  Do not use any products that contain nicotine or tobacco, such as cigarettes and e-cigarettes. If you need help quitting, ask your health care provider. Why are these changes important? Making these changes lowers your risk of many diseases that can cause cerebrovascular disease and stroke. Stroke is a leading cause of death and disability. Making these changes also improves your overall health and quality of life. What can I do to lower my risk? The following factors make you more likely to develop cerebrovascular disease:  Being overweight.  Smoking.  Being physically inactive.    Eating a high-fat diet.  Having certain health conditions, such as: ? Diabetes. ? High blood pressure. ? Heart disease. ? Atherosclerosis. ? High cholesterol. ? Sickle cell disease.  Talk with your health care provider about your risk for cerebrovascular disease. Work with your health care provider to control diseases that you have that may contribute to cerebrovascular disease. Your health care provider may prescribe medicines to help prevent major causes of cerebrovascular disease. Where to find more information: Learn more about preventing cerebrovascular disease from:  National Heart, Lung, and  Blood Institute: www.nhlbi.nih.gov/health/health-topics/topics/stroke  Centers for Disease Control and Prevention: cdc.gov/stroke/about.htm  Summary  Cerebrovascular disease can lead to a stroke.  Atherosclerosis and high blood pressure are major causes of cerebrovascular disease.  Making diet and lifestyle changes can reduce your risk of cerebrovascular disease.  Work with your health care provider to get your risk factors under control to reduce your risk of cerebrovascular disease. This information is not intended to replace advice given to you by your health care provider. Make sure you discuss any questions you have with your health care provider. Document Released: 04/21/2015 Document Revised: 10/25/2015 Document Reviewed: 04/21/2015 Elsevier Interactive Patient Education  2018 Elsevier Inc.  

## 2017-02-09 NOTE — Progress Notes (Signed)
Chief Complaint: Follow up Extracranial Carotid Artery Stenosis   History of Present Illness  [name] is a 71 y.o. male who is status post right carotid endarterectomy on 07/03/2015 by Dr. Trula Slade. This was done for symptomatic carotid stenosis.  The patient had a right brain stroke prior to his operation with left arm weakness.  He is nearly back to his baseline.  Intraoperative findings included a ulcerated plaque at the bifurcation with approximate 75% stenosis.  He denies any known subsequent stroke or TIA, denies claudication sx's with walking.   He had lumbar spine surgery in May 2016 and has intermittent right sciatic pain. His walking is limited by this pain.   Pt Diabetic: yes, he does not know what his last A1C was, no recent results on file, states his FBS runs 90-100.  Pt smoker: former smoker, quit in 2010, started at age 71 years   Pt meds include: Statin : yes ASA: yes Other anticoagulants/antiplatelets: no   Past Medical History:  Diagnosis Date  . Anxiety   . ASCVD (arteriosclerotic cardiovascular disease)    03/2004: DES x2 to the RCA; IMI with stent occlusion in 02/2005- suboptimal Plavix compliance  . CAD (coronary artery disease)   . Carcinoma of prostate ([place])    Clopidogrel held for biopsy  . Constipation due to pain medication   . Diabetes mellitus without complication ([place])   . Diabetic neuropathy ([place])   . Hyperlipidemia   . Hypertension   . Mild depression ([place])   . Morbid obesity ([place])   . Myocardial infarction ([place])   . Shortness of breath dyspnea   . Stroke ([place])   . Tobacco abuse    stopped smoking 06/28/09    Social History Social History  Substance Use Topics  . Smoking status: Former Smoker    Types: Cigarettes    Quit date: 06/28/2008  . Smokeless tobacco: Former Systems developer    Quit date: 06/28/2009  . Alcohol use 1.8 oz/week    2 Glasses of wine, 1 Cans of beer per week     Comment: Occasionally    Family History Family  History  Problem Relation Age of Onset  . Kidney disease Mother   . Stroke Father   . Colon cancer Neg Hx     Surgical History Past Surgical History:  Procedure Laterality Date  . BACK SURGERY    . CARDIAC CATHETERIZATION     X 1 stent before having CABG  . CORONARY ARTERY BYPASS GRAFT  06/28/2008   X4  . ENDARTERECTOMY Right 07/03/2015   Procedure: RIGHT CAROTID ENDARTERECTOMY WITH BOVINE PERICARDIUM PATCH ANGIOPLASTY;  Surgeon: Serafina Mitchell, MD;  Location: [facility];  Service: Vascular;  Laterality: Right;  . LUMBAR LAMINECTOMY/DECOMPRESSION MICRODISCECTOMY N/A 09/06/2014   Procedure: LUMBER DECOMPRESSION L3-5;  Surgeon: Melina Schools, MD;  Location: [facility];  Service: Orthopedics;  Laterality: N/A;  . LUMBAR SPINE SURGERY  2002  . PROSTATECTOMY  02/2007  . VASECTOMY      Allergies  Allergen Reactions  . Lisinopril Cough    Current Outpatient Prescriptions  Medication Sig Dispense Refill  . amLODipine (NORVASC) 10 MG tablet Take 1 tablet (10 mg total) by mouth daily. 30 tablet 11  . aspirin EC 81 MG tablet Take 81 mg by mouth daily.    Marland Kitchen atorvastatin (LIPITOR) 80 MG tablet TK 1 T PO DAILY  4  . losartan (COZAAR) 100 MG tablet Take 100 mg by mouth daily.    . metFORMIN (GLUCOPHAGE)  500 MG tablet TK 1/2 T PO DAILY  0  . metoprolol succinate (TOPROL-XL) 25 MG 24 hr tablet TK 1 T PO D  2  . Naproxen Sodium (ALEVE PO) Take by mouth.    . NONFORMULARY OR COMPOUNDED ITEM [facility]  Naftifine HCL Cream/Gel - 3% Apply 1-2 grams to affected area 2 times daily Qty. 120 gm 3 refills 1 each 0   No current facility-administered medications for this visit.     Review of Systems : See HPI for pertinent positives and negatives.  Physical Examination  Vitals:   02/09/17 1451 02/09/17 1453  BP: (!) 152/61 (!) 155/87  Pulse: 65   Resp: 16   Temp: 97.6 F (36.4 C)   TempSrc: Oral   SpO2: 95%   Weight: 232 lb (105.2 kg)   Height: 6\' 2"  (1.88 m)    Body mass index is  29.79 kg/m.  General: WDWN male in NAD GAIT: normal Eyes: PERRLA Pulmonary:  Respirations are non-labored, good air movement, CTAB, no rales, rhonchi, or wheezing.  Cardiac: regular rhythm, no detected murmur.  VASCULAR EXAM Carotid Bruits Right Left   Negative Positive     Abdominal aortic pulse is not palpable. Radial pulses are 2+ palpable and equal.                                                                                                                            LE Pulses Right Left       POPLITEAL  not palpable   not palpable       POSTERIOR TIBIAL  not palpable   1+ palpable        DORSALIS PEDIS      ANTERIOR TIBIAL 1+ palpable  not palpable     Gastrointestinal: soft, nontender, BS WNL, no r/g, no palpable masses.  Musculoskeletal: No muscle atrophy/wasting. M/S 5/5 throughout, extremities without ischemic changes.  Skin: No rashes, no ulcers, no cellulitis.    Neurologic:  A&O X 3; appropriate affect, sensation is normal; speech is normal, CN 2-12 intact, pain and light touch intact in extremities, motor exam as listed above.    Assessment: [name] is a 71 y.o. male status post right carotid endarterectomy on 07/03/2015.  He had a preoperative stroke, has no residual neurological deficits, had no subsequent strokes or TIA's.   DATA Carotid Duplex (02/09/17): Right ICA: CEA site with no restenosis. Left ICA: 1-39% stenosis. Bilateral vertebral artery flow is antegrade.  Bilateral subclavian artery waveforms are normal.  No significant change compared to the exam on 02-03-16.    Plan: Follow-up in 1 year with Carotid Duplex scan.   I discussed in depth with the patient the nature of atherosclerosis, and emphasized the importance of maximal medical management including strict control of blood pressure, blood glucose, and lipid levels, obtaining regular exercise, and continued cessation of smoking.  The patient is aware that without  maximal medical management the underlying atherosclerotic disease  process will progress, limiting the benefit of any interventions. The patient was given information about stroke prevention and what symptoms should prompt the patient to seek immediate medical care. Thank you for allowing [place] to participate in this patient's care.  Clemon Chambers, RN, MSN, FNP-C [facility] Office: [phone]  Clinic Physician: Early  02/09/17 2:56 PM

## 2017-02-17 DIAGNOSIS — L821 Other seborrheic keratosis: Secondary | ICD-10-CM | POA: Diagnosis not present

## 2017-02-17 DIAGNOSIS — Z85828 Personal history of other malignant neoplasm of skin: Secondary | ICD-10-CM | POA: Diagnosis not present

## 2017-02-17 DIAGNOSIS — D225 Melanocytic nevi of trunk: Secondary | ICD-10-CM | POA: Diagnosis not present

## 2017-02-24 NOTE — Addendum Note (Signed)
Addended by: Lianne Cure A on: 02/24/2017 03:30 PM   Modules accepted: Orders

## 2017-03-02 ENCOUNTER — Ambulatory Visit: Payer: Medicare Other | Admitting: Neurology

## 2017-03-17 ENCOUNTER — Encounter: Payer: Self-pay | Admitting: Neurology

## 2017-03-17 ENCOUNTER — Ambulatory Visit (INDEPENDENT_AMBULATORY_CARE_PROVIDER_SITE_OTHER): Payer: Medicare Other | Admitting: Neurology

## 2017-03-17 VITALS — BP 155/85 | HR 62 | Wt 237.0 lb

## 2017-03-17 DIAGNOSIS — Z9889 Other specified postprocedural states: Secondary | ICD-10-CM

## 2017-03-17 DIAGNOSIS — I10 Essential (primary) hypertension: Secondary | ICD-10-CM

## 2017-03-17 DIAGNOSIS — I6521 Occlusion and stenosis of right carotid artery: Secondary | ICD-10-CM

## 2017-03-17 DIAGNOSIS — I63231 Cerebral infarction due to unspecified occlusion or stenosis of right carotid arteries: Secondary | ICD-10-CM | POA: Diagnosis not present

## 2017-03-17 NOTE — Progress Notes (Signed)
STROKE NEUROLOGY FOLLOW UP NOTE  NAME: [name] DOB: Feb 20, 1946  REASON FOR VISIT: stroke follow up HISTORY FROM: pt and chart  Today we had the pleasure of seeing [name] in follow-up at our Neurology Clinic. Pt was accompanied by wife.   History Summary [name] is a 71 y.o. male with history of CAD, HTN, Diabetes, HLD, former smokes was admitted on 06/18/15 for 2 episodes of of L arm weakness. He did not receive IV t-PA due to resolved symptoms. 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 MRA unremarkable. TTE EF 50-55%. LDL 65 and A1C 6.2. But CTA neck showed right ICA 65% stenosis and soft plaque, likely the source of emboli. [facility] consulted and he was put on DAPT for 3 months along with continuing lipitor.   08/20/15 follow up - the patient has been doing well. Had right CEA with Dr.Brabham on 07/03/2015 with patch angioplasty. Intraoperative findings included approximate 75% stenosis with an ulcerated area at the carotid bifurcation. Post op he is doing well. Off plavix and now on ASA 81.   Follow up 02/20/16 - pt has been doing well. Had follow up with Dr. Trula Slade on 02/03/16 and doing well. Repeat CUS showed right ICA widely patent and left ICA < 40% stenosis. Will repeat CUS in one year. BP at home somehow high at 160s, but today 131/79. Glucose controlled well.  Interval History During the interval time, he has been doing well. No recurrent stroke like symptoms. Followed with Dr. Trula Slade last month and CUS unremarkable. He continues to follow with Dr. Einar Gip and PCP, doing well. Glucose and BP controlled well at home but today in clinic 155/85 and he stated that he has not taken his BP meds today. He walks everyday.   REVIEW OF SYSTEMS: Full 14 system review of systems performed and notable only for those listed below and in HPI above, all others are negative:  Constitutional:   Cardiovascular:  Ear/Nose/Throat:   Skin:  Eyes:   Respiratory:    Gastroitestinal:   Genitourinary:  Hematology/Lymphatic:   Endocrine:  Musculoskeletal:  Joint pain, back pain, walking difficulty Allergy/Immunology:   Neurological:   Psychiatric:  Sleep: apnea  The following represents the patient's updated allergies and side effects list: Allergies  Allergen Reactions  . Lisinopril Cough    The neurologically relevant items on the patient's problem list were reviewed on today's visit.  Neurologic Examination  A problem focused neurological exam (12 or more points of the single system neurologic examination, vital signs counts as 1 point, cranial nerves count for 8 points) was performed.  Blood pressure (!) 155/85, pulse 62, weight 237 lb (107.5 kg).  General - Well nourished, well developed, in no apparent distress.  Ophthalmologic - Fundi not visualized due to noncooperation.  Cardiovascular - Regular rate and rhythm.  Mental Status -  Level of arousal and orientation to time, place, and person were intact. Language including expression, naming, repetition, comprehension was assessed and found intact. Fund of Knowledge was assessed and was intact.  Cranial Nerves II - XII - II - Visual field intact OU. III, IV, VI - Extraocular movements intact. V - Facial sensation intact bilaterally. VII - Facial movement intact bilaterally. VIII - Hearing & vestibular intact bilaterally. X - Palate elevates symmetrically. XI - Chin turning & shoulder shrug intact bilaterally. XII - Tongue protrusion intact.  Motor Strength - The patient's strength was normal in all extremities and pronator drift was absent.  Bulk was normal and fasciculations were absent.   Motor Tone - Muscle tone was assessed at the neck and appendages and was normal.  Reflexes - The patient's reflexes were 1+ in all extremities and he had no pathological reflexes.  Sensory - Light touch, temperature/pinprick were assessed and were normal.    Coordination - The patient  had normal movements in the hands and feet with no ataxia or dysmetria.  Tremor was absent.  Gait and Station - The patient's transfers, posture, gait, station, and turns were observed as normal.   Data reviewed: I personally reviewed the images and agree with the radiology interpretations.  Ct Head Wo Contrast 06/18/2015  Study within normal limits.   Mri brain and Mra head Wo Contrast 06/19/2015  1. Punctate acute infarct in the right parietal cortex.  2. No prior infarct or white matter disease.  3. Negative intracranial MRA.   CUS Done 04/2015 by Dr. Einar Gip - right ICA 16-49% stenosis   TTE  06/20/2015 Study Conclusions - Left ventricle: The cavity size was normal. Wall thickness was normal. Systolic function was normal. The estimated ejection fraction was in the range of 50% to 55%. Wall motion was normal; there were no regional wall motion abnormalities. Features are consistent with a pseudonormal left ventricular filling pattern, with concomitant abnormal relaxation and increased filling pressure (grade 2 diastolic dysfunction). - Aortic valve: Mildly calcified annulus. Moderately thickened, mildly calcified leaflets. - Left atrium: The atrium was mildly dilated.  CTA neck  06/20/2015 Atherosclerosis results in approximately 65% stenosis proximal RIGHT internal carotid artery. Less than 50% stenosis LEFT internal carotid artery origin. Mild stenosis of LEFT vertebral artery origin.  CUS 02/03/16 The right carotid endarterectomy site is widely patent without evidence of restenosis.  There is less than 40% stenosis of the left internal carotid artery  CUS 02/09/17 Bilateral: 1-39% ICA stenosis. Vertebral artery flow is antegrade.   Component     Latest Ref Rng 06/19/2015  Cholesterol     0 - 200 mg/dL 123  Triglycerides     <150 mg/dL 61  HDL Cholesterol     >40 mg/dL 46  Total CHOL/HDL Ratio      2.7  VLDL     0 - 40 mg/dL 12  LDL  (calc)     0 - 99 mg/dL 65  Hemoglobin A1C     4.8 - 5.6 % 6.2 (H)  Mean Plasma Glucose      131    Assessment: As you may recall, he is a 71 y.o. Caucasian male with PMH of CAD, HTN, Diabetes, HLD, former smokes was admitted on 06/18/15 for 2 episodes of of L arm weakness. 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 MRA unremarkable. TTE EF 50-55%. LDL 65 and A1C 6.2. But CTA neck showed right ICA 65% stenosis and soft plaque, likely the source of emboli. VVS consulted and he was put on DAPT for 3 months along with continuing lipitor. Had right CEA with Dr.Brabham on 07/03/2015 with patch angioplasty. Post op he is doing well. Off plavix and now on ASA 81. Followed with VVS and repeat CUS x 2 unremarkable. BP and glucose in control.  Plan:  - continue ASA and lipitor for stroke prevention - check BP and glucose at home and record - follow up with Dr. Einar Gip and Dr. Trula Slade as scheduled - Follow up with your primary care physician for stroke risk factor modification. Recommend maintain blood pressure goal 130/80, diabetes with hemoglobin A1c goal  below 6.5% and lipids with LDL cholesterol goal below 70 mg/dL.  - healthy diet and regular exercise  - follow up as needed.  No orders of the defined types were placed in this encounter.   No orders of the defined types were placed in this encounter.   Patient Instructions  - continue ASA and lipitor for stroke prevention - check BP and glucose at home and record - follow up with Dr. Einar Gip and Dr. Trula Slade as scheduled - Follow up with your primary care physician for stroke risk factor modification. Recommend maintain blood pressure goal 130/80, diabetes with hemoglobin A1c goal below 6.5% and lipids with LDL cholesterol goal below 70 mg/dL.  - healthy diet and regular exercise  - follow up as needed.   [name], MD PhD [facility] [address], [place], [place] [address] [phone]

## 2017-03-17 NOTE — Patient Instructions (Signed)
-   continue ASA and lipitor for stroke prevention - check BP and glucose at home and record - follow up with Dr. Einar Gip and Dr. Trula Slade as scheduled - Follow up with your primary care physician for stroke risk factor modification. Recommend maintain blood pressure goal 130/80, diabetes with hemoglobin A1c goal below 6.5% and lipids with LDL cholesterol goal below 70 mg/dL.  - healthy diet and regular exercise  - follow up as needed.

## 2017-03-24 IMAGING — MR MR HEAD W/O CM
9 of 11 series · 32 of 48 positions shown · non-contrast
Comparison: Head CT from yesterday

CLINICAL DATA: Two transient episodes of left arm weakness
yesterday and today.

EXAM:
MRI HEAD WITHOUT CONTRAST
MRA HEAD WITHOUT CONTRAST
TECHNIQUE: Multiplanar, multiecho pulse sequences of the brain and surrounding
structures were obtained without intravenous contrast. Angiographic
images of the head were obtained using MRA technique without
contrast.

[Series 3: T1 · sagittal · 5.0mm · 0.47mm/px · 1 of 23 slices shown]
[im 1/23]
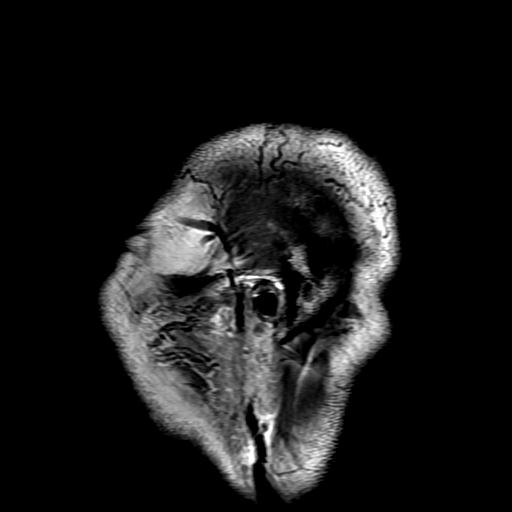

[Series 4: DWI · axial · 3.0mm · 1.09mm/px · z∈[-70,+64]mm · 7 of 94 slices shown (1 of 4)]
[im 1/94]
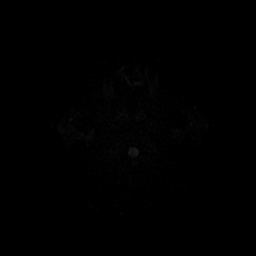
[im 16/94]
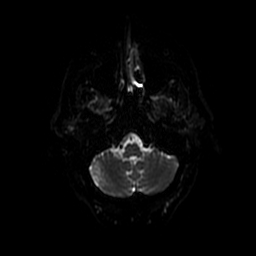
[im 32/94]
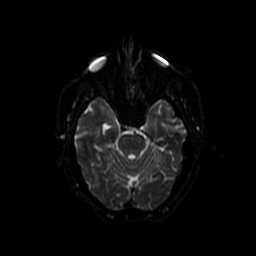
[im 47/94]
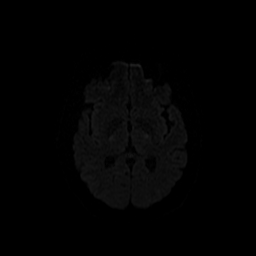
[im 63/94]
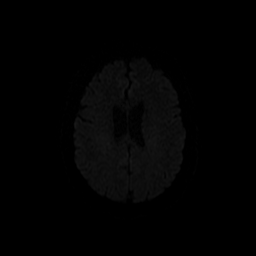
[im 78/94]
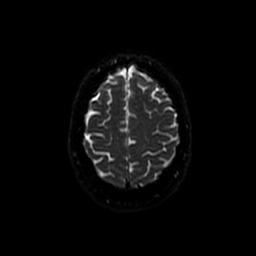
[im 94/94]
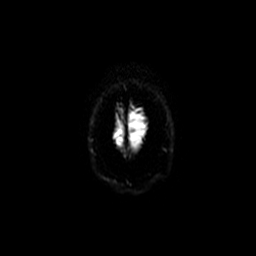

[Series 5: DWI · coronal · 5.0mm · 1.09mm/px · 5 of 66 slices shown (2 of 4)]
[im 1/66]
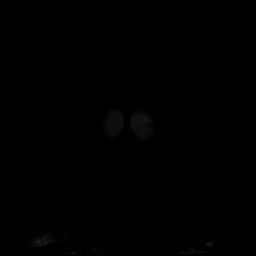
[im 17/66]
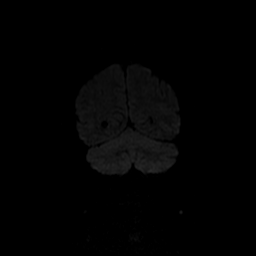
[im 33/66]
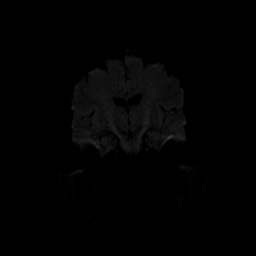
[im 49/66]
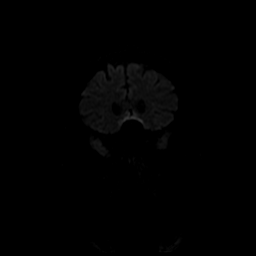
[im 66/66]
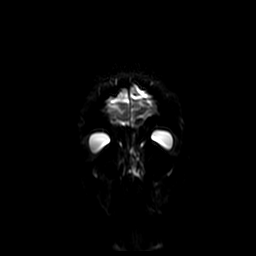

[Series 6: (id) mt fs · axial · 1.4mm · 0.43mm/px · z∈[-68,+4]mm · 6 of 154 slices shown]
[im 1/154]
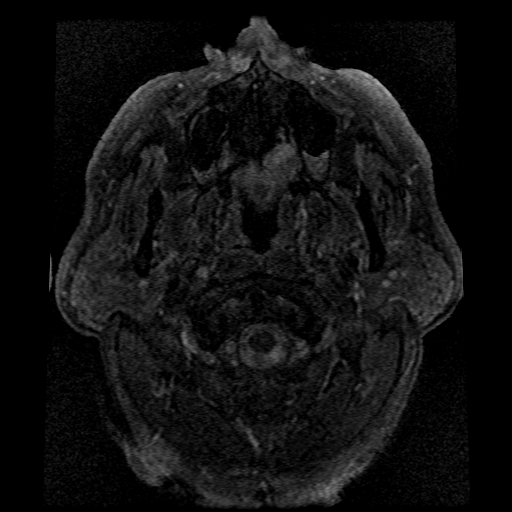
[im 28/154]
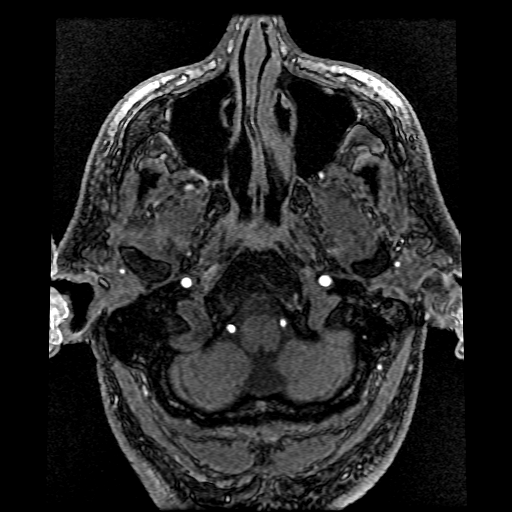
[im 42/154]
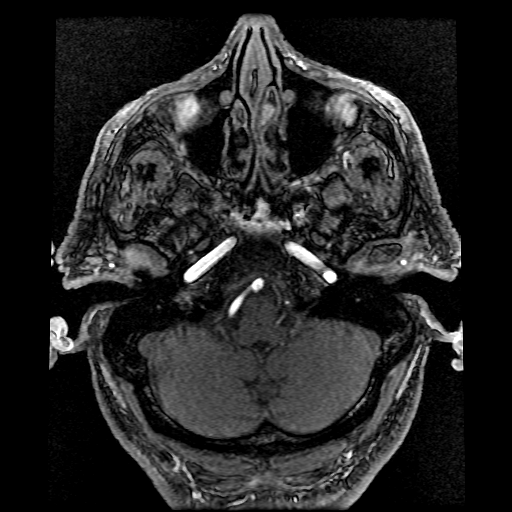
[im 70/154]
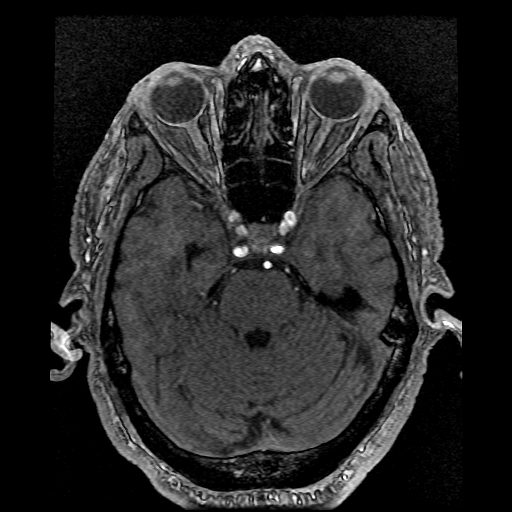
[im 84/154]
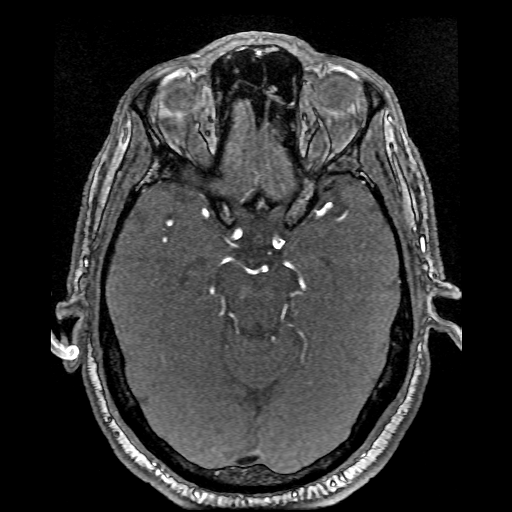
[im 112/154]
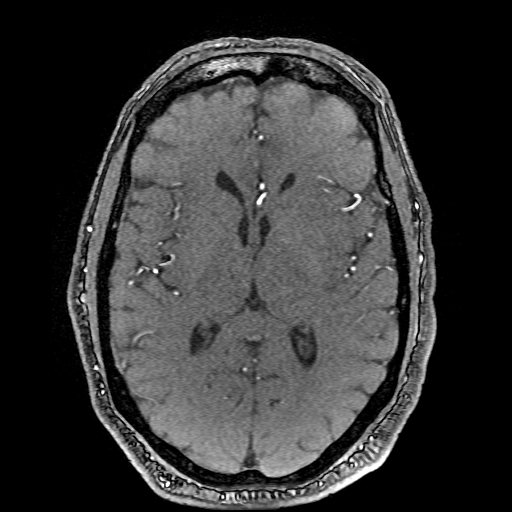

[Series 7: T2 · axial · 5.0mm · 0.47mm/px · z∈[-80,+59]mm · 2 of 25 slices shown (1 of 2)]
[im 1/25]
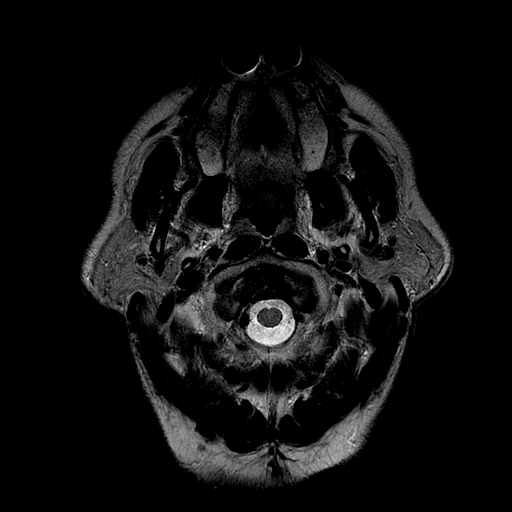
[im 25/25]
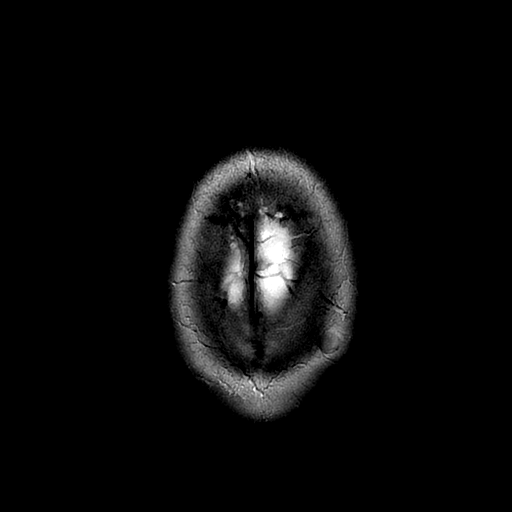

[Series 8: FLAIR · axial · 5.0mm · 0.47mm/px · z∈[-80,+59]mm · 2 of 25 slices shown]
[im 1/25]
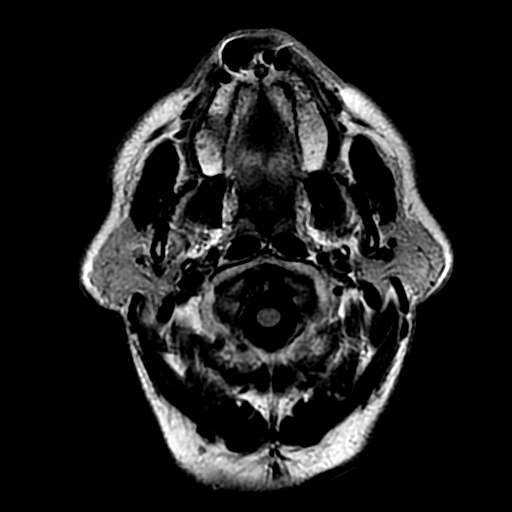
[im 25/25]
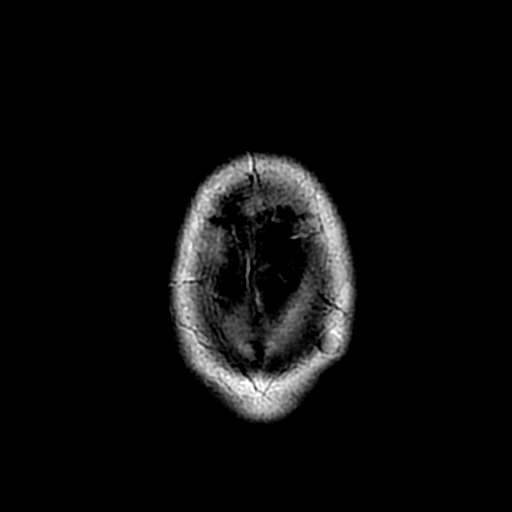

[Series 9: T2 · coronal · 5.0mm · 0.43mm/px · 2 of 30 slices shown (2 of 2)]
[im 1/30]
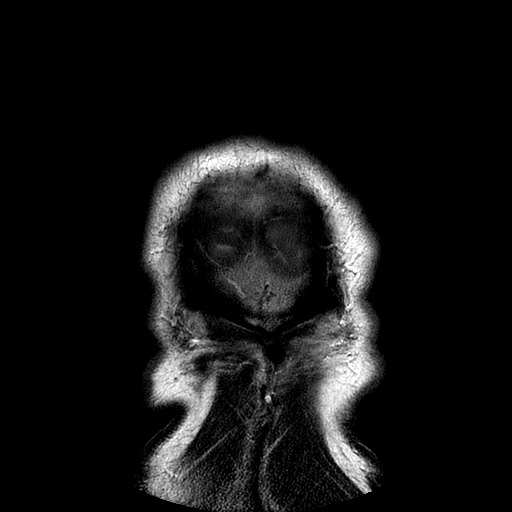
[im 30/30]
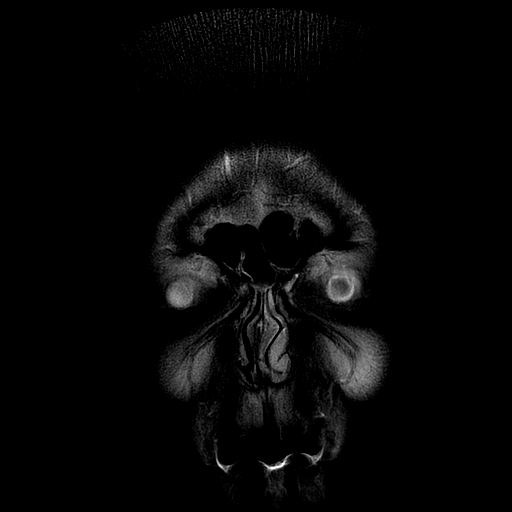

[Series 400: DWI · axial · 3.0mm · 1.09mm/px · z∈[-70,+64]mm · 4 of 47 slices shown (3 of 4)]
[im 1/47]
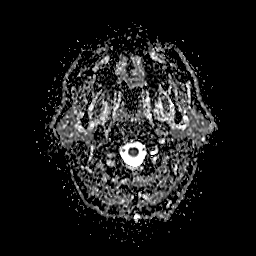
[im 16/47]
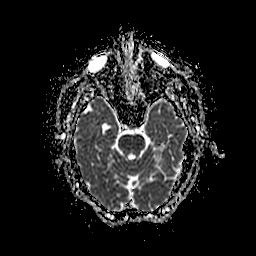
[im 31/47]
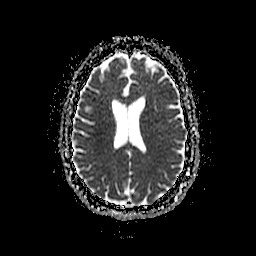
[im 47/47]
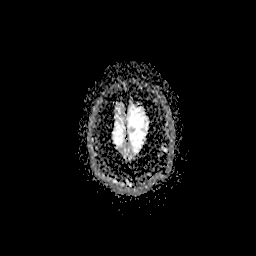

[Series 500: DWI · coronal · 5.0mm · 1.09mm/px · 3 of 33 slices shown (4 of 4)]
[im 1/33]
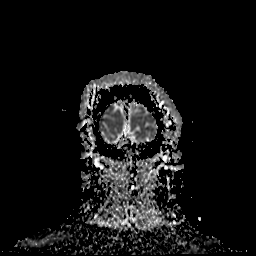
[im 17/33]
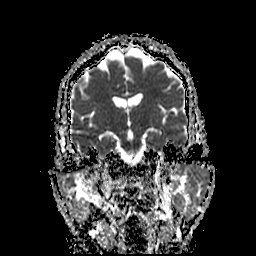
[im 33/33]
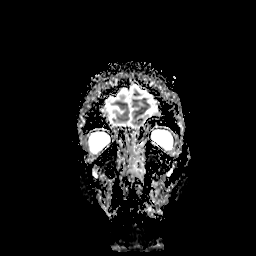

[32 of 48 positions shown; findings below may reference images not displayed]

FINDINGS: MRI HEAD FINDINGS

Calvarium and upper cervical spine: No focal marrow signal
abnormality. Cervical facet arthropathy. Dermal inclusion cyst in
left parietal scalp.

Orbits: Negative.

Sinuses and Mastoids: Clear.

Brain: Punctate restricted diffusion in the right occipital cortex.
No signs of hemorrhage.

MRA HEAD FINDINGS

Symmetric carotid arteries. Mild right vertebral artery dominance.
Dominant left PICA and right AICA. Intact circle-of-Willis.

No major branch occlusion or flow limiting stenosis. No vessel
irregularity. Negative for aneurysm. Infundibulum at the bilateral
posterior communicating arteries.
IMPRESSION: 1. Punctate acute infarct in the right parietal cortex.
2. No prior infarct or white matter disease.
3. Negative intracranial MRA.

## 2017-04-05 DIAGNOSIS — G4733 Obstructive sleep apnea (adult) (pediatric): Secondary | ICD-10-CM | POA: Diagnosis not present

## 2017-06-04 DIAGNOSIS — I10 Essential (primary) hypertension: Secondary | ICD-10-CM | POA: Diagnosis not present

## 2017-06-04 DIAGNOSIS — E119 Type 2 diabetes mellitus without complications: Secondary | ICD-10-CM | POA: Diagnosis not present

## 2017-06-04 DIAGNOSIS — I2510 Atherosclerotic heart disease of native coronary artery without angina pectoris: Secondary | ICD-10-CM | POA: Diagnosis not present

## 2017-06-16 DIAGNOSIS — E119 Type 2 diabetes mellitus without complications: Secondary | ICD-10-CM | POA: Diagnosis not present

## 2017-07-13 DIAGNOSIS — I10 Essential (primary) hypertension: Secondary | ICD-10-CM | POA: Diagnosis not present

## 2017-07-13 DIAGNOSIS — R0609 Other forms of dyspnea: Secondary | ICD-10-CM | POA: Diagnosis not present

## 2017-07-13 DIAGNOSIS — I2510 Atherosclerotic heart disease of native coronary artery without angina pectoris: Secondary | ICD-10-CM | POA: Diagnosis not present

## 2017-07-13 DIAGNOSIS — I739 Peripheral vascular disease, unspecified: Secondary | ICD-10-CM | POA: Diagnosis not present

## 2017-07-14 DIAGNOSIS — I739 Peripheral vascular disease, unspecified: Secondary | ICD-10-CM | POA: Diagnosis not present

## 2017-07-15 DIAGNOSIS — C61 Malignant neoplasm of prostate: Secondary | ICD-10-CM | POA: Diagnosis not present

## 2017-07-22 DIAGNOSIS — N5231 Erectile dysfunction following radical prostatectomy: Secondary | ICD-10-CM | POA: Diagnosis not present

## 2017-07-22 DIAGNOSIS — C61 Malignant neoplasm of prostate: Secondary | ICD-10-CM | POA: Diagnosis not present

## 2017-08-13 DIAGNOSIS — R0609 Other forms of dyspnea: Secondary | ICD-10-CM | POA: Diagnosis not present

## 2017-08-13 DIAGNOSIS — I10 Essential (primary) hypertension: Secondary | ICD-10-CM | POA: Diagnosis not present

## 2017-08-13 DIAGNOSIS — I2510 Atherosclerotic heart disease of native coronary artery without angina pectoris: Secondary | ICD-10-CM | POA: Diagnosis not present

## 2017-08-13 DIAGNOSIS — I739 Peripheral vascular disease, unspecified: Secondary | ICD-10-CM | POA: Diagnosis not present

## 2017-09-16 DIAGNOSIS — I10 Essential (primary) hypertension: Secondary | ICD-10-CM | POA: Diagnosis not present

## 2017-09-22 DIAGNOSIS — Z9889 Other specified postprocedural states: Secondary | ICD-10-CM | POA: Diagnosis not present

## 2017-09-22 DIAGNOSIS — I2510 Atherosclerotic heart disease of native coronary artery without angina pectoris: Secondary | ICD-10-CM | POA: Diagnosis not present

## 2017-09-22 DIAGNOSIS — I10 Essential (primary) hypertension: Secondary | ICD-10-CM | POA: Diagnosis not present

## 2017-10-20 DIAGNOSIS — I10 Essential (primary) hypertension: Secondary | ICD-10-CM | POA: Diagnosis not present

## 2017-11-02 DIAGNOSIS — I2510 Atherosclerotic heart disease of native coronary artery without angina pectoris: Secondary | ICD-10-CM | POA: Diagnosis not present

## 2017-11-02 DIAGNOSIS — I10 Essential (primary) hypertension: Secondary | ICD-10-CM | POA: Diagnosis not present

## 2017-11-02 DIAGNOSIS — Z9889 Other specified postprocedural states: Secondary | ICD-10-CM | POA: Diagnosis not present

## 2017-11-25 DIAGNOSIS — M79604 Pain in right leg: Secondary | ICD-10-CM | POA: Diagnosis not present

## 2017-12-17 DIAGNOSIS — I2510 Atherosclerotic heart disease of native coronary artery without angina pectoris: Secondary | ICD-10-CM | POA: Diagnosis not present

## 2017-12-17 DIAGNOSIS — I739 Peripheral vascular disease, unspecified: Secondary | ICD-10-CM | POA: Diagnosis not present

## 2017-12-17 DIAGNOSIS — I10 Essential (primary) hypertension: Secondary | ICD-10-CM | POA: Diagnosis not present

## 2017-12-17 DIAGNOSIS — Z9889 Other specified postprocedural states: Secondary | ICD-10-CM | POA: Diagnosis not present

## 2018-01-19 DIAGNOSIS — I739 Peripheral vascular disease, unspecified: Secondary | ICD-10-CM | POA: Diagnosis not present

## 2018-01-21 DIAGNOSIS — C61 Malignant neoplasm of prostate: Secondary | ICD-10-CM | POA: Diagnosis not present

## 2018-01-28 DIAGNOSIS — Z0000 Encounter for general adult medical examination without abnormal findings: Secondary | ICD-10-CM | POA: Diagnosis not present

## 2018-01-28 DIAGNOSIS — Z23 Encounter for immunization: Secondary | ICD-10-CM | POA: Diagnosis not present

## 2018-01-28 DIAGNOSIS — M48061 Spinal stenosis, lumbar region without neurogenic claudication: Secondary | ICD-10-CM | POA: Diagnosis not present

## 2018-01-28 DIAGNOSIS — I739 Peripheral vascular disease, unspecified: Secondary | ICD-10-CM | POA: Diagnosis not present

## 2018-01-28 DIAGNOSIS — E119 Type 2 diabetes mellitus without complications: Secondary | ICD-10-CM | POA: Diagnosis not present

## 2018-01-28 DIAGNOSIS — I6523 Occlusion and stenosis of bilateral carotid arteries: Secondary | ICD-10-CM | POA: Diagnosis not present

## 2018-01-28 DIAGNOSIS — I10 Essential (primary) hypertension: Secondary | ICD-10-CM | POA: Diagnosis not present

## 2018-01-31 DIAGNOSIS — I10 Essential (primary) hypertension: Secondary | ICD-10-CM | POA: Diagnosis not present

## 2018-01-31 DIAGNOSIS — M48062 Spinal stenosis, lumbar region with neurogenic claudication: Secondary | ICD-10-CM | POA: Diagnosis not present

## 2018-01-31 DIAGNOSIS — I2510 Atherosclerotic heart disease of native coronary artery without angina pectoris: Secondary | ICD-10-CM | POA: Diagnosis not present

## 2018-01-31 DIAGNOSIS — Z9889 Other specified postprocedural states: Secondary | ICD-10-CM | POA: Diagnosis not present

## 2018-02-03 DIAGNOSIS — C61 Malignant neoplasm of prostate: Secondary | ICD-10-CM | POA: Diagnosis not present

## 2018-02-03 DIAGNOSIS — R9721 Rising PSA following treatment for malignant neoplasm of prostate: Secondary | ICD-10-CM | POA: Diagnosis not present

## 2018-02-07 ENCOUNTER — Other Ambulatory Visit: Payer: Self-pay | Admitting: Urology

## 2018-02-07 DIAGNOSIS — C61 Malignant neoplasm of prostate: Secondary | ICD-10-CM

## 2018-02-18 ENCOUNTER — Ambulatory Visit (HOSPITAL_COMMUNITY)
Admission: RE | Admit: 2018-02-18 | Discharge: 2018-02-18 | Disposition: A | Discharge status: Home / Self Care | Payer: Medicare Other | Source: Ambulatory Visit | Attending: Urology | Admitting: Urology

## 2018-02-18 ENCOUNTER — Encounter (HOSPITAL_COMMUNITY)
Admission: RE | Admit: 2018-02-18 | Discharge: 2018-02-18 | Disposition: A | Discharge status: Home / Self Care | Payer: Medicare Other | Source: Ambulatory Visit | Attending: Urology | Admitting: Urology

## 2018-02-18 DIAGNOSIS — C61 Malignant neoplasm of prostate: Secondary | ICD-10-CM | POA: Insufficient documentation

## 2018-02-18 MED ORDER — TECHNETIUM TC 99M MEDRONATE IV KIT
20.0000 | PACK | Freq: Once | INTRAVENOUS | Status: AC | PRN
  Administered 2018-02-18: 21 via INTRAVENOUS

## 2018-03-08 DIAGNOSIS — R9721 Rising PSA following treatment for malignant neoplasm of prostate: Secondary | ICD-10-CM | POA: Diagnosis not present

## 2018-03-08 DIAGNOSIS — C61 Malignant neoplasm of prostate: Secondary | ICD-10-CM | POA: Diagnosis not present

## 2018-03-21 DIAGNOSIS — I739 Peripheral vascular disease, unspecified: Secondary | ICD-10-CM | POA: Diagnosis not present

## 2018-03-21 DIAGNOSIS — I2510 Atherosclerotic heart disease of native coronary artery without angina pectoris: Secondary | ICD-10-CM | POA: Diagnosis not present

## 2018-03-21 DIAGNOSIS — I10 Essential (primary) hypertension: Secondary | ICD-10-CM | POA: Diagnosis not present

## 2018-03-21 DIAGNOSIS — M48062 Spinal stenosis, lumbar region with neurogenic claudication: Secondary | ICD-10-CM | POA: Diagnosis not present

## 2018-05-26 ENCOUNTER — Telehealth: Payer: Self-pay

## 2018-05-26 ENCOUNTER — Encounter: Payer: Self-pay | Admitting: Cardiology

## 2018-05-26 ENCOUNTER — Ambulatory Visit (INDEPENDENT_AMBULATORY_CARE_PROVIDER_SITE_OTHER): Payer: Medicare Other | Admitting: Cardiology

## 2018-05-26 ENCOUNTER — Other Ambulatory Visit: Payer: Self-pay

## 2018-05-26 VITALS — BP 118/75 | HR 79 | Ht 74.0 in | Wt 228.0 lb

## 2018-05-26 DIAGNOSIS — M48062 Spinal stenosis, lumbar region with neurogenic claudication: Secondary | ICD-10-CM | POA: Diagnosis not present

## 2018-05-26 DIAGNOSIS — I70213 Atherosclerosis of native arteries of extremities with intermittent claudication, bilateral legs: Secondary | ICD-10-CM

## 2018-05-26 DIAGNOSIS — I2510 Atherosclerotic heart disease of native coronary artery without angina pectoris: Secondary | ICD-10-CM

## 2018-05-26 DIAGNOSIS — R42 Dizziness and giddiness: Secondary | ICD-10-CM | POA: Diagnosis not present

## 2018-05-26 DIAGNOSIS — R0600 Dyspnea, unspecified: Secondary | ICD-10-CM

## 2018-05-26 DIAGNOSIS — R0609 Other forms of dyspnea: Secondary | ICD-10-CM | POA: Diagnosis not present

## 2018-05-26 DIAGNOSIS — Z951 Presence of aortocoronary bypass graft: Secondary | ICD-10-CM

## 2018-05-26 DIAGNOSIS — I10 Essential (primary) hypertension: Secondary | ICD-10-CM | POA: Diagnosis not present

## 2018-05-26 MED ORDER — ATORVASTATIN CALCIUM 80 MG PO TABS: 80.0000 mg | ORAL_TABLET | Freq: Every day | ORAL | 1 refills | Status: DC

## 2018-05-26 MED ORDER — EZETIMIBE 10 MG PO TABS: 10.0000 mg | ORAL_TABLET | Freq: Every day | ORAL | 0 refills | Status: DC

## 2018-05-26 NOTE — Progress Notes (Signed)
Subjective:   @Patient  ID@: [name], male    DOB: Dec 14, 1945, 73 y.o.   MRN: [id number]  Maurice Small, MD:  No chief complaint on file.   HPI  Patient with history of CAD status post CABG 4 in 2010, h/o non-compliance to medications, hypertension, hyperlipidemia, diabetes, with history of CVA in 2017 thought to be embolic from carotid atherosclerosis underwent right carotid endarterectomy with patch angioplasty by Dr. Trula Slade in March 2017.  Patient has resistant hypertension and renal artery duplex in July 2019 was negative for renal artery stenosis. He has a history of noncompliance with medication, and does admit to occasionally missing a dose of his medications.  He does have sleep apnea by sleep study in 2018; however, states that he had difficulty with the mask and does not use this.  Patient is here for a week follow-up for claudication symptoms.  He has been evaluated by Dr. Rolena Infante he feels that his symptoms of claudication are related to spinal stenosis.  He currently does not have a follow-up scheduled with him, but is considering making another appointment to further discuss treatment options as he continues to have claudication symptoms particularly in his right leg and also in bilateral hips.  Has pain in his back that is chronic.  Does have worsening pain in his left leg with climbing stairs.  Symptoms improve with taking ibuprofen.  He has been on gabapentin for the last few months, but has not noticed significant improvement with this.  No ulcerations.  He does have dyspnea on exertion that has been chronic, but he does feel slightly worsened.  Denies any exertional chest pain.  His main complaint today is dizziness with sudden position changes and with suddenly looking up.  He has not followed up with vascular surgery for carotid disease.  No syncope.  He reports he has been taking his medications regularly for the last few weeks.  He has brought his medications  with him today, but is missing a few of his blood pressure medicines that were on his medication list from his last office visit and he isn't sure if he is currently taking them.  Past Medical History:  Diagnosis Date  . Anxiety   . ASCVD (arteriosclerotic cardiovascular disease)    03/2004: DES x2 to the RCA; IMI with stent occlusion in 02/2005- suboptimal Plavix compliance  . CAD (coronary artery disease)   . Carcinoma of prostate ([place])    Clopidogrel held for biopsy  . Constipation due to pain medication   . Diabetes mellitus without complication ([place])   . Diabetic neuropathy ([place])   . Hyperlipidemia   . Hypertension   . Mild depression ([place])   . Morbid obesity ([place])   . Myocardial infarction ([place])   . Shortness of breath dyspnea   . Stroke ([place])   . Tobacco abuse    stopped smoking 06/28/09    Past Surgical History:  Procedure Laterality Date  . BACK SURGERY    . CARDIAC CATHETERIZATION     X 1 stent before having CABG  . CORONARY ARTERY BYPASS GRAFT  06/28/2008   X4  . ENDARTERECTOMY Right 07/03/2015   Procedure: RIGHT CAROTID ENDARTERECTOMY WITH BOVINE PERICARDIUM PATCH ANGIOPLASTY;  Surgeon: Serafina Mitchell, MD;  Location: [facility];  Service: Vascular;  Laterality: Right;  . LUMBAR LAMINECTOMY/DECOMPRESSION MICRODISCECTOMY N/A 09/06/2014   Procedure: LUMBER DECOMPRESSION L3-5;  Surgeon: Melina Schools, MD;  Location: [facility];  Service: Orthopedics;  Laterality: N/A;  .  LUMBAR SPINE SURGERY  2002  . PROSTATECTOMY  02/2007  . VASECTOMY      Family History  Problem Relation Age of Onset  . Kidney disease Mother   . Stroke Father   . Colon cancer Neg Hx     Social History   Socioeconomic History  . Marital status: Married    Spouse name: Not on file  . Number of children: 4  . Years of education: Not on file  . Highest education level: Not on file  Occupational History  . Occupation: Technical brewer  Social Needs  . Financial resource strain: Not on file  . Food  insecurity:    Worry: Not on file    Inability: Not on file  . Transportation needs:    Medical: Not on file    Non-medical: Not on file  Tobacco Use  . Smoking status: Former Smoker    Types: Cigarettes    Last attempt to quit: 06/28/2008    Years since quitting: 9.9  . Smokeless tobacco: Former Systems developer    Quit date: 06/28/2009  Substance and Sexual Activity  . Alcohol use: Yes    Alcohol/week: 3.0 standard drinks    Types: 2 Glasses of wine, 1 Cans of beer per week    Comment: Occasionally  . Drug use: No  . Sexual activity: Not on file  Lifestyle  . Physical activity:    Days per week: Not on file    Minutes per session: Not on file  . Stress: Not on file  Relationships  . Social connections:    Talks on phone: Not on file    Gets together: Not on file    Attends religious service: Not on file    Active member of club or organization: Not on file    Attends meetings of clubs or organizations: Not on file    Relationship status: Not on file  . Intimate partner violence:    Fear of current or ex partner: Not on file    Emotionally abused: Not on file    Physically abused: Not on file    Forced sexual activity: Not on file  Other Topics Concern  . Not on file  Social History Narrative   Married with 3 sons and 1 daughter   Daily caffeine use, 3 per day     Current Outpatient Medications:  .  amLODipine (NORVASC) 10 MG tablet, Take 1 tablet (10 mg total) by mouth daily., Disp: 30 tablet, Rfl: 11 .  aspirin EC 81 MG tablet, Take 81 mg by mouth daily., Disp: , Rfl:  .  baclofen (LIORESAL) 20 MG tablet, Take 20 mg by mouth 3 (three) times daily., Disp: , Rfl:  .  gabapentin (NEURONTIN) 300 MG capsule, Take 1 capsule by mouth 2 (two) times daily., Disp: , Rfl:  .  hydrALAZINE (APRESOLINE) 50 MG tablet, Take 1 tablet by mouth 3 (three) times daily., Disp: , Rfl:  .  metFORMIN (GLUCOPHAGE) 500 MG tablet, Take 500 mg by mouth daily with breakfast. , Disp: , Rfl: 0 .  Naproxen  Sodium (ALEVE PO), Take by mouth., Disp: , Rfl:  .  NONFORMULARY OR COMPOUNDED ITEM, [facility]  Naftifine HCL Cream/Gel - 3% Apply 1-2 grams to affected area 2 times daily Qty. 120 gm 3 refills, Disp: 1 each, Rfl: 0 .  atorvastatin (LIPITOR) 80 MG tablet, Take 1 tablet (80 mg total) by mouth daily at 6 PM., Disp: 90 tablet, Rfl: 1 .  ezetimibe (ZETIA)  10 MG tablet, Take 1 tablet (10 mg total) by mouth daily., Disp: 90 tablet, Rfl: 0 .  losartan-hydrochlorothiazide (HYZAAR) 100-12.5 MG tablet, , Disp: , Rfl:  .  metoprolol succinate (TOPROL-XL) 25 MG 24 hr tablet, 1 tablet by mouth daily, Disp: 30 tablet, Rfl: 2  Review of Systems  Constitution: Positive for malaise/fatigue. Negative for decreased appetite, weight gain and weight loss.  Eyes: Negative for visual disturbance.  Cardiovascular: Positive for claudication (right leg worsening) and dyspnea on exertion. Negative for chest pain and near-syncope.  Respiratory: Negative for hemoptysis and wheezing.   Endocrine: Negative for cold intolerance and heat intolerance.  Skin: Negative for nail changes.  Musculoskeletal: Positive for back pain and joint pain. Negative for myalgias.  Gastrointestinal: Negative for abdominal pain, nausea and vomiting.  Neurological: Positive for dizziness. Negative for difficulty with concentration, focal weakness and headaches.  Psychiatric/Behavioral: Negative for altered mental status and suicidal ideas.  All other systems reviewed and are negative.      Objective:     Echocardiogram 10/27/2016: Left ventricle cavity is normal in size. Moderate concentric hypertrophy of the left ventricle. Normal global wall motion. Normal diastolic filling pattern. Calculated EF 55%. Left atrial cavity is moderately dilated. LA is much larger than the measured AP diameter at 4.1 cm. Trileaflet aortic valve with no regurgitation noted. Mild aortic valve leaflet calcification. Mild (Grade I) mitral regurgitation.  Mild mitral valve leaflet calcification. Mild tricuspid regurgitation. No evidence of pulmonary hypertension. Compared to the study done on 08/29/2014 no significant change.  Exercise myoview stress 10/30/2016 1. The resting electrocardiogram demonstrated normal sinus rhythm, RBBB and no resting arrhythmias. The stress electrocardiogram was positive for ischemia with 2 mm ST depression and T inversion in V1 to V3 with peak exercise which resolved at 3 mintues into recovery. Patient exercised on Bruce protocol for 6:15 minutes and achieved 7.05METS. Stress test terminated due to dyspnea and 88 % MPHR achieved (Target HR >85%). 2. The LV is dilated both at rest and stress images. The LV end diastolic volume was 503TW.This is an abnormal myocardial perfusion imaging study demonstrating a mixture of scar plus mild ischemia in the basal inferior, basal inferolateral, mid inferior, mid inferolateral, apical inferior and apical lateral myocardial wall(s). Ischemia more prominent in the lateral wall. Gated SPECT imaging demonstrates hypokinesis of the basal inferior, basal inferolateral, mid inferior and mid inferolateral myocardial wall(s). The left ventricular ejection fraction was calculated to be 40%. Compared to the prior study from 08/31/2014, the current study reveals no significant change. Previously Lexiscan stress performed. This is an intermediate risk study, clinical correlation recommended.  Lower extremity arterial duplex 01/19/2018: Diffuse calcific mild plaque noted throughout the bilateral lower extremity. There is >50% stenosis in the right distal anterior tibial artery. Diffuxe moderately abnormal biphasic waveform in both the lower extremity suggests diffuse disease. This exam reveals normal perfusion of both the lower extremity (ABI 1.00).  Renal artery duplex 10/20/2017: No evidence of renal artery occlusive disease in either renal artery. Normal size both kidneys with normal  intrarenal vascular perfusion is noted in the right kidney. Mild diffuse plaque noted in the abdominal aorta.    Physical Exam  Constitutional: He appears well-developed and well-nourished.  HENT:  Head: Normocephalic and atraumatic.  Neck: Normal range of motion.  Cardiovascular: Normal rate, regular rhythm and S1 normal.  Pulses:      Carotid pulses are on the right side with bruit and on the left side with bruit.  Femoral pulses are 2+ on the right side and 2+ on the left side.      Popliteal pulses are 1+ on the right side and 1+ on the left side.       Dorsalis pedis pulses are 2+ on the right side and 2+ on the left side.       Posterior tibial pulses are 2+ on the right side and 0 on the left side.  Split S2  Skin: Skin is warm, dry and intact. No cyanosis. No pallor.  Vitals reviewed.      Assessment & Recommendations:   1. Atherosclerosis of native artery of both lower extremities with intermittent claudication [facility]) Patient has intermittent claudication of bilateral lower extremities. He has diffuse disease by lower extremity duplex. No evidence of ischemic limb. I suspect that his claudication symptoms are multifactoral and most likely related to neurogenic claudication from his spinal issues. Vascular exam is essentially normal today. I have recommended that we continue with aggressive medical management. He has not previously tolerated by Cilostazol (unsure why). I have recommended that he continue to follow up with Dr. Rolena Infante and if he does not feel that related to his spinal issues, will consider PV angiogram.   2. Resistant hypertension Blood pressure is very well controlled today. Patient was unsure of his medications today; however, later called the office and is not taking Metoprolol. I have encouraged him to restart this.   3. Spinal stenosis of lumbar region with neurogenic claudication Suspect that this is the major etiology for his claudication. Encouraged  him to continue to follow up with Dr. Rolena Infante.  4. Atherosclerosis of native coronary artery of native heart without angina pectoris No symptoms of angina. Encouraged him to restart Metoprolol. Otherwise on appropriate medical therapy.   5. History of coronary artery bypass graft No symptoms of angina. As stated above.  6. Dyspnea on exertion Has chronic dyspnea on exertion. Suspect related to inactivity. Encouraged him to walk as tolerated for CAD, PAD, and also improve activity tolerance.  7. Dizziness Not noted to be orthostatic today. May be related to medications, could consider decreasing his amlodipine and see if symptoms improve. He also has history of carotid stenosis and is due for follow up with Vascular. Encouraged him to contact them for appt.   I will see him back in 3 months or sooner if problems.      Jeri Lager, FNP-C [facility], [place] Office: [phone] Fax: [phone]

## 2018-05-27 ENCOUNTER — Encounter: Payer: Self-pay | Admitting: Cardiology

## 2018-05-27 MED ORDER — METOPROLOL SUCCINATE ER 25 MG PO TB24: ORAL_TABLET | ORAL | 2 refills | Status: DC

## 2018-06-09 DIAGNOSIS — C61 Malignant neoplasm of prostate: Secondary | ICD-10-CM | POA: Diagnosis not present

## 2018-06-15 ENCOUNTER — Other Ambulatory Visit (HOSPITAL_COMMUNITY): Payer: Self-pay | Admitting: Urology

## 2018-06-15 DIAGNOSIS — R972 Elevated prostate specific antigen [PSA]: Secondary | ICD-10-CM

## 2018-06-15 DIAGNOSIS — N5231 Erectile dysfunction following radical prostatectomy: Secondary | ICD-10-CM | POA: Diagnosis not present

## 2018-06-15 DIAGNOSIS — C61 Malignant neoplasm of prostate: Secondary | ICD-10-CM

## 2018-06-15 DIAGNOSIS — R9721 Rising PSA following treatment for malignant neoplasm of prostate: Secondary | ICD-10-CM | POA: Diagnosis not present

## 2018-06-23 ENCOUNTER — Encounter (HOSPITAL_COMMUNITY): Payer: Self-pay

## 2018-06-23 ENCOUNTER — Ambulatory Visit (HOSPITAL_COMMUNITY)
Admission: RE | Admit: 2018-06-23 | Discharge: 2018-06-23 | Disposition: A | Discharge status: Home / Self Care | Payer: Medicare Other | Source: Ambulatory Visit | Attending: Urology | Admitting: Urology

## 2018-06-23 DIAGNOSIS — C61 Malignant neoplasm of prostate: Secondary | ICD-10-CM | POA: Diagnosis not present

## 2018-06-23 DIAGNOSIS — R972 Elevated prostate specific antigen [PSA]: Secondary | ICD-10-CM

## 2018-06-23 MED ORDER — AXUMIN (FLUCICLOVINE F 18) INJECTION
10.5000 | Freq: Once | INTRAVENOUS | Status: AC
  Administered 2018-06-23: 10.5 via INTRAVENOUS

## 2018-07-14 DIAGNOSIS — C61 Malignant neoplasm of prostate: Secondary | ICD-10-CM | POA: Diagnosis not present

## 2018-07-14 DIAGNOSIS — Z5111 Encounter for antineoplastic chemotherapy: Secondary | ICD-10-CM | POA: Diagnosis not present

## 2018-07-18 ENCOUNTER — Telehealth: Payer: Self-pay | Admitting: Radiation Oncology

## 2018-07-18 NOTE — Telephone Encounter (Signed)
New Message:    Lft msg for patient to return call to schedule appt from referral received

## 2018-08-15 ENCOUNTER — Encounter: Payer: Self-pay | Admitting: Radiation Oncology

## 2018-08-15 NOTE — Progress Notes (Signed)
GU Location of Tumor / Histology: prostatic adenocarcinoma s/p robotic prostatectomy done 03/20/2007 with positive margins and two positive nodes.  If Prostate Cancer, Gleason Score is (4 + 3) and PSA is (32) prior to therapy.  [name] began Lupron 05/21/2007 and stopped September 2010. Lupron was resumed 07/14/2018. Reports next Lupron injection is scheduled for June 2020.   FINAL DIAGNOSIS    1. PROSTATE, RADICAL RESECTION: - PROSTATIC ADENOCARCINOMA, COMBINED GLEASON' S SCORE OF 7 (4+3), INVOLVING BOTH LOBES - TUMOR INVOLVES EXTERNAL CAPSULAR SURFACE - BENIGN SEMINAL VESICLES - SEE ONCOLOGY TABLE  2. LYMPH NODES, RIGHT PELVIC, EXCISION: - METASTATIC CARCINOMA IN TWO OUT OF EIGHT LYMPH NODES  3. LYMPH NODES, LEFT PELVIC, EXCISION: - NO TUMOR IDENTIFIED IN THREE LYMPH NODES  COMMENT ONCOLOGY TABLE-PROSTATE, RADICAL RESECTION/PROSTATECTOMY  1. Histology: Prostatic adenocarcinoma 2. Gleason' s Score: 7 (4+3) 3. Involving (half lobe or less, one or both lobes): Both lobes 4. Capsular extension (extraprostatic): See comment 5. Margins: Tumor involves the external capsular surface particularly in the right lobe. The apical and base surgical margins are free of tumor 6. Seminal vesicles: Benign 7. Fixed or invading structures other than seminal vesicles: Unknown 8. Lymph nodes: # examined - 54; # positive - 2 9. TNM code: pTX, pN1, pMX 10. Comments: The sections show a prostatic adenocarcinoma, with a combined Gleason' s score of 7 (4+3). The invasive carcinoma involves both lobes. In the area of the right lobe in particular, the tumor infiltrates the full thickness of the capsule and in some areas involves the inked external capsular surface. In these particular areas, pericapsular soft tissue is not represented and hence extracapsular extension by tumor cannot be excluded. Where pericapsular soft tissues are represented,  definite extracapsular extension by tumor is not seen. The base and apical surgical margins of resection are free of tumor. Examination of lymph nodes shows metastatic carcinoma in two of eight right pelvic lymph nodes and no involvement of three left pelvic lymph nodes. (BNS:kv 02-22-07)   Past/Anticipated interventions by urology, if any: prostate biopsy, prostatectomy, lupron, bone scan, PET scan, referral for consideration of salvage radiotherapy  Past/Anticipated interventions by medical oncology, if any: no  Weight changes, if any: no  Bowel/Bladder complaints, if any: IPSS 2. SHIM 1. Reports erectile dysfunction. Denies incontinence or having to strain to void. Denies any bowel complaints. Reports very seldom does he experience dysuria. Denies hematuria.    Nausea/Vomiting, if any: no  Pain issues, if any:  Reports aching back and hip pain that radiates down his legs. Reports intermittent intense cramping in legs at times. Reports taking Aleve to aid in the management of this pain. Reports fatiguing easily while ambulating. States, "I just haven't been right since my back surgery three years ago."  SAFETY ISSUES:  Prior radiation? denies  Pacemaker/ICD? no  Possible current pregnancy? no, male patient  Is the patient on methotrexate? no  Current Complaints / other details:  73 year old male. Married with four children.

## 2018-08-16 ENCOUNTER — Ambulatory Visit
Admission: RE | Admit: 2018-08-16 | Discharge: 2018-08-16 | Disposition: A | Discharge status: Home / Self Care | Payer: Medicare Other | Source: Ambulatory Visit | Attending: Radiation Oncology | Admitting: Radiation Oncology

## 2018-08-16 ENCOUNTER — Other Ambulatory Visit: Payer: Self-pay

## 2018-08-16 ENCOUNTER — Encounter: Payer: Self-pay | Admitting: Radiation Oncology

## 2018-08-16 VITALS — Ht 74.0 in | Wt 232.0 lb

## 2018-08-16 DIAGNOSIS — C775 Secondary and unspecified malignant neoplasm of intrapelvic lymph nodes: Secondary | ICD-10-CM | POA: Diagnosis not present

## 2018-08-16 DIAGNOSIS — Z9079 Acquired absence of other genital organ(s): Secondary | ICD-10-CM | POA: Diagnosis not present

## 2018-08-16 DIAGNOSIS — C61 Malignant neoplasm of prostate: Secondary | ICD-10-CM

## 2018-08-16 DIAGNOSIS — R9721 Rising PSA following treatment for malignant neoplasm of prostate: Secondary | ICD-10-CM | POA: Diagnosis not present

## 2018-08-16 DIAGNOSIS — Z803 Family history of malignant neoplasm of breast: Secondary | ICD-10-CM | POA: Diagnosis not present

## 2018-08-16 NOTE — Progress Notes (Signed)
Radiation Oncology         (336) [phone] ________________________________  Initial Outpatient Consultation - Conducted via WebEx due to current COVID-19 concerns for limiting patient exposure  Name: [name] MRN: [id number]  Date: 08/16/2018  DOB: [birth year]  VV:OHYW, [name], MD  Lucas Mallow, MD   REFERRING PHYSICIAN: Lucas Mallow, MD  DIAGNOSIS: 73 y.o. gentleman with biochemical recurrence of Gleason 4+3 adenocarcinoma of the prostate status post RALP in 2008 with current detectable PSA of 0.49.    ICD-10-CM   1. Malignant neoplasm of prostate ([place]) C61     HISTORY OF PRESENT ILLNESS: [name] is a 73 y.o. male with a diagnosis of biochemically recurrent prostate cancer. He was originally diagnosed with Gleason 4+4 prostate cancer on biopsy in August 2008, with pre-treatment PSA of 32. He underwent robotic prostatectomy with Dr. Risa Grill on 02/21/2007. Final pathology revealed prostatic adenocarcinoma, Gleason 4+3, involving both lobes and the external capsular surface (particularly in the right lobe).  The apical and base surgical margins were free of tumor and seminal vesicles were benign but there was metastatic carcinoma in 2 out of 8 right pelvic lymph nodes, and none identified in the 3 left pelvic lymph nodes sampled.   The patient's PSA came down to 0.2 immediately after surgery. He then began androgen deprivation therapy with Lupron in January 2009-2011 but has been off of this for about 8-9 years now. Since coming off ADT, his PSA has been on a very slow, gradual rise, but recently has doubled within 6 months as documented below.  The patient has been followed by Dr. Gloriann Loan for the past 2 years.  PSA History: 05/2018: PSA 0.49 01/2018: PSA 0.23 06/2017: PSA 0.18 10/2016: PSA 0.116 03/2016: PSA 0.080 08/2015: PSA 0.06 12/2014: PSA 0.05 06/2014: PSA 0.04  He underwent a bone scan on 02/18/2018 which showed nonspecific increased tracer accumulation at  the inferior left sternum. There were additional sites of likely degenerative type uptake without additional suspicious abnormalities. CT scan of the abdomen/pelvis did not suggest metastatic disease.   More recently, an [facility] PET scan on 06/23/2018 did not show any evidence of nodal metastasis or local recurrence in the pelvis. There was no evidence of distant metastatic disease or skeletal metastasis.  The patient reviewed the PSA results and recent imaging with his urologist and he has kindly been referred today for discussion of salvage radiation treatment. The patient was restarted on ADT with a 3 month Lupron injection on 07/14/2018. Next Lupron injection is scheduled for June 2020.   PREVIOUS RADIATION THERAPY: No  PAST MEDICAL HISTORY:  Past Medical History:  Diagnosis Date   Anxiety    ASCVD (arteriosclerotic cardiovascular disease)    03/2004: DES x2 to the RCA; IMI with stent occlusion in 02/2005- suboptimal Plavix compliance   CAD (coronary artery disease)    Carcinoma of prostate (HCC)    Clopidogrel held for biopsy   Constipation due to pain medication    Diabetes mellitus without complication (HCC)    Diabetic neuropathy (HCC)    Hyperlipidemia    Hypertension    Mild depression ([place])    Morbid obesity ([place])    Myocardial infarction ([place])    Shortness of breath dyspnea    Stroke [facility])    Tobacco abuse    stopped smoking 06/28/09      PAST SURGICAL HISTORY: Past Surgical History:  Procedure Laterality Date   BACK SURGERY     CARDIAC CATHETERIZATION  X 1 stent before having CABG   CORONARY ARTERY BYPASS GRAFT  06/28/2008   X4   ENDARTERECTOMY Right 07/03/2015   Procedure: RIGHT CAROTID ENDARTERECTOMY WITH BOVINE PERICARDIUM PATCH ANGIOPLASTY;  Surgeon: Serafina Mitchell, MD;  Location: [facility];  Service: Vascular;  Laterality: Right;   LUMBAR LAMINECTOMY/DECOMPRESSION MICRODISCECTOMY N/A 09/06/2014   Procedure: LUMBER DECOMPRESSION L3-5;  Surgeon:  Melina Schools, MD;  Location: [facility];  Service: Orthopedics;  Laterality: N/A;   LUMBAR SPINE SURGERY  2002   PROSTATE BIOPSY     PROSTATECTOMY  02/2007   VASECTOMY      FAMILY HISTORY:  Family History  Problem Relation Age of Onset   Kidney disease Mother    Stroke Father    Breast cancer Sister        breast progressed into bone   Colon cancer Neg Hx    Prostate cancer Neg Hx    Pancreatic cancer Neg Hx     SOCIAL HISTORY:  Social History   Socioeconomic History   Marital status: Married    Spouse name: Not on file   Number of children: 4   Years of education: Not on file   Highest education level: Not on file  Occupational History   Occupation: Technical brewer    Comment: retired   Occupation: Designer, 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  Scientist, product/process development strain: Not on file   Food insecurity:    Worry: Not on file    Inability: Not on file   Transportation needs:    Medical: Not on file    Non-medical: Not on file  Tobacco Use   Smoking status: Former Smoker    Packs/day: 1.00    Years: 40.00    Pack years: 40.00    Types: Cigarettes    Last attempt to quit: 06/28/2008    Years since quitting: 10.1   Smokeless tobacco: Former Systems developer    Quit date: 06/28/2009  Substance and Sexual Activity   Alcohol use: Yes    Alcohol/week: 3.0 standard drinks    Types: 2 Glasses of wine, 1 Cans of beer per week    Comment: Occasionally   Drug use: No   Sexual activity: Not on file  Lifestyle   Physical activity:    Days per week: Not on file    Minutes per session: Not on file   Stress: Not on file  Relationships   Social connections:    Talks on phone: Not on file    Gets together: Not on file    Attends religious service: Not on file    Active member of club or organization: Not on file    Attends meetings of clubs or organizations: Not on file    Relationship status: Not on file   Intimate  partner violence:    Fear of current or ex partner: Not on file    Emotionally abused: Not on file    Physically abused: Not on file    Forced sexual activity: Not on file  Other Topics Concern   Not on file  Social History Narrative   Married with 3 sons and 1 daughter   Daily caffeine use, 3 per day    ALLERGIES: Lisinopril  MEDICATIONS:  Current Outpatient Medications  Medication Sig Dispense Refill   amLODipine (NORVASC) 10 MG tablet Take 1 tablet (10 mg total) by mouth daily. 30 tablet 11   aspirin EC 81  MG tablet Take 81 mg by mouth daily.     atorvastatin (LIPITOR) 80 MG tablet Take 1 tablet (80 mg total) by mouth daily at 6 PM. 90 tablet 1   baclofen (LIORESAL) 20 MG tablet Take 20 mg by mouth 3 (three) times daily.     gabapentin (NEURONTIN) 300 MG capsule Take 1 capsule by mouth 2 (two) times daily. Reports taking one time daily     losartan-hydrochlorothiazide (HYZAAR) 100-12.5 MG tablet      metFORMIN (GLUCOPHAGE) 500 MG tablet Take 500 mg by mouth daily with breakfast.   0   metoprolol succinate (TOPROL-XL) 25 MG 24 hr tablet 1 tablet by mouth daily 30 tablet 2   Naproxen Sodium (ALEVE PO) Take by mouth.     No current facility-administered medications for this encounter.     REVIEW OF SYSTEMS:  On review of systems, the patient reports that he is doing well overall. He denies any chest pain, shortness of breath, cough, fevers, chills, night sweats, or unintended weight changes. He reports hot flashes and decreased energy level since re-starting ADT. He denies any bowel disturbances, and denies abdominal pain, nausea or vomiting. He reports aching back and hip pain that radiates down his legs. He reports intermittent intense cramping in legs at times. He reports taking Aleve to aid in the management of his pain. He reports fatiguing easily with ambulation. He states, "I just haven't been right since my back surgery three years ago." His IPSS was 2, indicating mild  urinary symptoms. He denies incontinence or having to strain to void. He reports very rare dysuria and denies hematuria. His SHIM was 1, indicating he does have severe erectile dysfunction. A complete review of systems is obtained and is otherwise negative.  PHYSICAL EXAM:  Wt Readings from Last 3 Encounters:  08/16/18 232 lb (105.2 kg)  05/26/18 228 lb (103.4 kg)  03/21/18 236 lb 2.1 oz (107.1 kg)   Temp Readings from Last 3 Encounters:  02/09/17 97.6 F (36.4 C) (Oral)  02/03/16 97.8 F (36.6 C) (Oral)  07/05/15 98.1 F (36.7 C)   BP Readings from Last 3 Encounters:  05/26/18 118/75  03/21/18 (!) 172/81  03/17/17 (!) 155/85   Pulse Readings from Last 3 Encounters:  05/26/18 79  03/21/18 68  03/17/17 62   Pain Assessment Pain Score: 0-No pain/10  In general this is a well appearing African American male in no acute distress. He is alert and oriented x4 and appropriate throughout the examination. Cardiopulmonary assessment is negative for acute distress and he exhibits normal effort.  Remainder of exam not performed in light of virtual telemedicine WebEx consultation platform.   KPS = 90  100 - Normal; no complaints; no evidence of disease. 90   - Able to carry on normal activity; minor signs or symptoms of disease. 80   - Normal activity with effort; some signs or symptoms of disease. 75   - Cares for self; unable to carry on normal activity or to do active work. 60   - Requires occasional assistance, but is able to care for most of his personal needs. 50   - Requires considerable assistance and frequent medical care. 41   - Disabled; requires special care and assistance. 51   - Severely disabled; hospital admission is indicated although death not imminent. 22   - Very sick; hospital admission necessary; active supportive treatment necessary. 10   - Moribund; fatal processes progressing rapidly. 0     - Dead  Karnofsky DA, Abelmann WH, Craver LS and Burchenal [facility] [phone])  The use of the nitrogen mustards in the palliative treatment of carcinoma: with particular reference to bronchogenic carcinoma Cancer 1 634-56  LABORATORY DATA:  Lab Results  Component Value Date   WBC 5.6 07/04/2015   HGB 11.7 (L) 07/04/2015   HCT 36.7 (L) 07/04/2015   MCV 88.4 07/04/2015   PLT 152 07/04/2015   Lab Results  Component Value Date   NA 140 07/04/2015   K 4.1 07/04/2015   CL 105 07/04/2015   CO2 26 07/04/2015   Lab Results  Component Value Date   ALT 15 (L) 07/01/2015   AST 20 07/01/2015   ALKPHOS 71 07/01/2015   BILITOT 1.3 (H) 07/01/2015     RADIOGRAPHY: No results found.    IMPRESSION/PLAN: 1. 73 y.o. gentleman with biochemical recurrence of Gleason 4+3 adenocarcinoma of the prostate status post RALP in 2008 with current detectable PSA of 0.49.  Today we reviewed the findings and workup thus far.  We discussed the natural history of prostate cancer.  We reviewed the the implications of extracapsular extension and lymph node involvement on the risk of prostate cancer recurrence. We discussed radiation treatment directed to the prostatic fossa with regard to the logistics and delivery of external beam radiation treatment. We also detailed the role of ADT in the treatment of biochemically recurrent prostate cancer and outlined the associated side effects that could be expected with this therapy.  At the end of the conversation the patient is interested in moving forward with salvage radiation treatment in combination with ADT. The patient has already received his first Lupron injection on 07/14/2018. We will share our discussion with Dr. Gloriann Loan and anticipate a course of 38 daily radiation treatments delivered to the prostatic fossa over a course of 7.5 weeks. His CT simulation/treatment planning is scheduled for Friday May 1st at 10:00 AM in anticipation of beginning his treatments around 08/29/2018.  This encounter was provided by telemedicine platform Webex.  The  patient has given verbal consent for this type of encounter and has been advised to only accept a meeting of this type in a secure network environment. The time spent during this encounter was 60 minutes, 50% of that time was spent in the review of outside records and coordination of the patient's care. The attendants for this meeting include Tyler Pita MD, Freeman Caldron PA-C, scribe Clinton Sawyer, patient [name] and his wife. During the encounter, Tyler Pita MD, Ashlyn Bruning PA-C, and scribe Clinton Sawyer were located at [facility] Radiation Oncology Department.  Patient [name] and wife were located at home.     Nicholos Johns, PA-C    Tyler Pita, MD  [facility] Direct Dial: [phone]   Fax: [phone] [email or web address]   Skype   [email or web address]  This document serves as a record of services personally performed by Tyler Pita, MD and Freeman Caldron, PA-C. It was created on their behalf by Rae Lips, a trained medical scribe. The creation of this record is based on the scribe's personal observations and the providers' statements to them. This document has been checked and approved by the attending providers.

## 2018-08-16 NOTE — Progress Notes (Signed)
See progress note under physician encounter. 

## 2018-08-19 ENCOUNTER — Encounter: Payer: Self-pay | Admitting: Medical Oncology

## 2018-08-19 ENCOUNTER — Other Ambulatory Visit: Payer: Self-pay

## 2018-08-19 ENCOUNTER — Ambulatory Visit
Admission: RE | Admit: 2018-08-19 | Discharge: 2018-08-19 | Disposition: A | Discharge status: Home / Self Care | Payer: Medicare Other | Source: Ambulatory Visit | Attending: Radiation Oncology | Admitting: Radiation Oncology

## 2018-08-19 DIAGNOSIS — C61 Malignant neoplasm of prostate: Secondary | ICD-10-CM | POA: Insufficient documentation

## 2018-08-19 DIAGNOSIS — Z510 Encounter for antineoplastic radiation therapy: Secondary | ICD-10-CM | POA: Diagnosis not present

## 2018-08-19 NOTE — Progress Notes (Signed)
  Radiation Oncology         [phone] ________________________________  Name: [name] MRN: [id number]  Date: 08/19/2018  DOB: [birth year]  SIMULATION AND TREATMENT PLANNING NOTE    ICD-10-CM   1. ADENOCARCINOMA, PROSTATE C61     DIAGNOSIS:  72 y.o. gentleman with biochemical recurrence of Gleason 4+3 adenocarcinoma of the prostate status post RALP in 2008 with current detectable PSA of 0.49.  NARRATIVE:  The patient was brought to the [facility].  Identity was confirmed.  All relevant records and images related to the planned course of therapy were reviewed.  The patient freely provided informed written consent to proceed with treatment after reviewing the details related to the planned course of therapy. The consent form was witnessed and verified by the simulation staff.  Then, the patient was set-up in a stable reproducible supine position for radiation therapy.  A vacuum lock pillow device was custom fabricated to position his legs in a reproducible immobilized position.  Then, I performed a urethrogram under sterile conditions to identify the prostatic bed.  CT images were obtained.  Surface markings were placed.  The CT images were loaded into the planning software.  Then the prostate bed target, pelvic lymph node target and avoidance structures including the rectum, bladder, bowel and hips were contoured.  Treatment planning then occurred.  The radiation prescription was entered and confirmed.  A total of one complex treatment devices were fabricated. I have requested : Intensity Modulated Radiotherapy (IMRT) is medically necessary for this case for the following reason:  Rectal sparing.Marland Kitchen  PLAN:  The patient will receive 45 Gy in 25 fractions of 1.8 Gy, followed by a boost to the prostate bed to a total dose of 68.4 Gy with 13 additional fractions of 1.8 Gy.   ________________________________  Sheral Apley Tammi Klippel, M.D.   This document serves as a record of  services personally performed by Tyler Pita, MD. It was created on his behalf by Wilburn Mylar, a trained medical scribe. The creation of this record is based on the scribe's personal observations and the provider's statements to them. This document has been checked and approved by the attending provider.

## 2018-08-19 NOTE — Progress Notes (Signed)
Introduced myself to [name] as the prostate nurse navigator and my role. He is here today for CT simulation. He had prostatectomy in 2008 and now with rising PSA. He is scheduled to begin radiation 5/12. I asked him to call me with questions or concerns. He voiced understanding.

## 2018-08-22 ENCOUNTER — Encounter: Payer: Self-pay | Admitting: Cardiology

## 2018-08-23 ENCOUNTER — Ambulatory Visit (INDEPENDENT_AMBULATORY_CARE_PROVIDER_SITE_OTHER): Payer: Medicare Other | Admitting: Cardiology

## 2018-08-23 ENCOUNTER — Encounter: Payer: Self-pay | Admitting: Cardiology

## 2018-08-23 ENCOUNTER — Other Ambulatory Visit: Payer: Self-pay

## 2018-08-23 VITALS — BP 114/70 | HR 77 | Ht 74.0 in | Wt 233.0 lb

## 2018-08-23 DIAGNOSIS — C61 Malignant neoplasm of prostate: Secondary | ICD-10-CM | POA: Diagnosis not present

## 2018-08-23 DIAGNOSIS — I6521 Occlusion and stenosis of right carotid artery: Secondary | ICD-10-CM | POA: Diagnosis not present

## 2018-08-23 DIAGNOSIS — R0609 Other forms of dyspnea: Secondary | ICD-10-CM | POA: Diagnosis not present

## 2018-08-23 DIAGNOSIS — M48062 Spinal stenosis, lumbar region with neurogenic claudication: Secondary | ICD-10-CM

## 2018-08-23 DIAGNOSIS — I2510 Atherosclerotic heart disease of native coronary artery without angina pectoris: Secondary | ICD-10-CM

## 2018-08-23 DIAGNOSIS — I10 Essential (primary) hypertension: Secondary | ICD-10-CM

## 2018-08-23 DIAGNOSIS — I70213 Atherosclerosis of native arteries of extremities with intermittent claudication, bilateral legs: Secondary | ICD-10-CM | POA: Diagnosis not present

## 2018-08-23 DIAGNOSIS — R0600 Dyspnea, unspecified: Secondary | ICD-10-CM | POA: Insufficient documentation

## 2018-08-23 NOTE — Progress Notes (Signed)
Primary Physician/Referring:  Maurice Small, MD  Patient ID: [name], male    DOB: November 24, 1945, 73 y.o.   MRN: [id number]  Chief Complaint  Patient presents with  . Hypertension  . Follow-up   This visit type was conducted due to national recommendations for restrictions regarding the COVID-19 Pandemic (e.g. social distancing).  This format is felt to be most appropriate for this patient at this time.  All issues noted in this document were discussed and addressed.  No physical exam was performed (except for noted visual exam findings with Telehealth visits).  The patient has consented to conduct a Telehealth visit and understands insurance will be billed.   I discussed the limitations of evaluation and management by telemedicine and the availability of in person appointments. The patient expressed understanding and agreed to proceed.  Virtual Visit via Video Note is as below  I connected with Mr. Frett, on 08/23/18 at 1030 by a video enabled telemedicine application and verified that I am speaking with the correct person using two identifiers.     I have discussed with her regarding the safety during COVID Pandemic and steps and precautions including social distancing with the patient.    HPI: [name]  is a 73 y.o. 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 Trula Slade in March 2017. Also has history of prostate adenocarcinoma s/p RALP in 2008.   Patient has resistant hypertension and renal artery duplex in July 2019 was negative for renal artery stenosis. He has a history of noncompliance with medication, and does admit to occasionally missing a dose of his medications.  He does have sleep apnea by sleep study in 2018; however, states that he  had difficulty with the mask and does not use this.  This is a 3 month follow-up for claudication symptoms.  He has been evaluated by Dr. Rolena Infante he feels that his symptoms of claudication are related to spinal stenosis.  He currently does not have a follow-up scheduled with him, but is considering making another appointment to further discuss treatment options as he continues to have claudication symptoms particularly in his right leg and also in bilateral hips.  Has pain in his back that is chronic.  Does have pain in his left leg with climbing stairs.  Symptoms improve with taking ibuprofen.  No ulcerations.  He does have dyspnea on exertion that has been chronic and stable.  Denies any exertional chest pain. Complained of dizziness at his last office visit, but not noted to be orthostatic. No syncope. Since decreasing his dose of gabapentin to once a day, this has improved. No worsening leg pain.  Unfortunately, he has been found to have recurrent biochemical prostate adenocarcinoma in March 2020. Undergoing salvage radiation treatment in combination with ADT.   Past Medical History:  Diagnosis Date  . Anxiety   . ASCVD (arteriosclerotic cardiovascular disease)    03/2004: DES x2 to the RCA; IMI with stent occlusion in 02/2005- suboptimal Plavix compliance  . CAD (coronary artery disease)   . Carcinoma of prostate ([place])    Clopidogrel held for biopsy  . Constipation due to pain medication   . Diabetes mellitus without complication ([place])   . Diabetic neuropathy ([place])   . Hyperlipidemia   . Hypertension   . Mild depression ([place])   .  Morbid obesity ([place])   . Myocardial infarction ([place])   . Shortness of breath dyspnea   . Stroke ([place])   . Tobacco abuse    stopped smoking 06/28/09    Past Surgical History:  Procedure Laterality Date  . BACK SURGERY    . CARDIAC CATHETERIZATION     X 1 stent before having CABG  . CORONARY ARTERY BYPASS GRAFT  06/28/2008   X4  . ENDARTERECTOMY Right  07/03/2015   Procedure: RIGHT CAROTID ENDARTERECTOMY WITH BOVINE PERICARDIUM PATCH ANGIOPLASTY;  Surgeon: Serafina Mitchell, MD;  Location: [facility];  Service: Vascular;  Laterality: Right;  . LUMBAR LAMINECTOMY/DECOMPRESSION MICRODISCECTOMY N/A 09/06/2014   Procedure: LUMBER DECOMPRESSION L3-5;  Surgeon: Melina Schools, MD;  Location: [facility];  Service: Orthopedics;  Laterality: N/A;  . LUMBAR SPINE SURGERY  2002  . PROSTATE BIOPSY    . PROSTATECTOMY  02/2007  . VASECTOMY      Social History   Socioeconomic History  . Marital status: Married    Spouse name: Not on file  . Number of children: 4  . Years of education: Not on file  . Highest education level: Not on file  Occupational History  . Occupation: Technical brewer    Comment: retired  . Occupation: Designer, industrial/product: [employer]    Comment: full time  Social Needs  . Financial resource strain: Not on file  . Food insecurity:    Worry: Not on file    Inability: Not on file  . Transportation needs:    Medical: Not on file    Non-medical: Not on file  Tobacco Use  . Smoking status: Former Smoker    Packs/day: 1.00    Years: 40.00    Pack years: 40.00    Types: Cigarettes    Last attempt to quit: 06/28/2008    Years since quitting: 10.1  . Smokeless tobacco: Former Systems developer    Quit date: 06/28/2009  Substance and Sexual Activity  . Alcohol use: Yes    Alcohol/week: 3.0 standard drinks    Types: 2 Glasses of wine, 1 Cans of beer per week    Comment: Occasionally  . Drug use: No  . Sexual activity: Not on file  Lifestyle  . Physical activity:    Days per week: Not on file    Minutes per session: Not on file  . Stress: Not on file  Relationships  . Social connections:    Talks on phone: Not on file    Gets together: Not on file    Attends religious service: Not on file    Active member of club or organization: Not on file    Attends meetings of clubs or organizations: Not on file    Relationship status:  Not on file  . Intimate partner violence:    Fear of current or ex partner: Not on file    Emotionally abused: Not on file    Physically abused: Not on file    Forced sexual activity: Not on file  Other Topics Concern  . Not on file  Social History Narrative   Married with 3 sons and 1 daughter   Daily caffeine use, 3 per day    Current Outpatient Medications on File Prior to Visit  Medication Sig Dispense Refill  . amLODipine (NORVASC) 10 MG tablet Take 1 tablet (10 mg total) by mouth daily. 30 tablet 11  . aspirin EC 81 MG tablet Take 81 mg by mouth daily.    Marland Kitchen  atorvastatin (LIPITOR) 80 MG tablet Take 1 tablet (80 mg total) by mouth daily at 6 PM. 90 tablet 1  . baclofen (LIORESAL) 20 MG tablet Take 20 mg by mouth 3 (three) times daily.    Marland Kitchen losartan-hydrochlorothiazide (HYZAAR) 100-12.5 MG tablet     . metFORMIN (GLUCOPHAGE) 500 MG tablet Take 500 mg by mouth daily with breakfast.   0  . metoprolol succinate (TOPROL-XL) 25 MG 24 hr tablet 1 tablet by mouth daily 30 tablet 2  . Naproxen Sodium (ALEVE PO) Take by mouth.     No current facility-administered medications on file prior to visit.     Review of Systems  Constitution: Positive for malaise/fatigue. Negative for decreased appetite, weight gain and weight loss.  Eyes: Negative for visual disturbance.  Cardiovascular: Positive for claudication (right leg worsening) and dyspnea on exertion. Negative for chest pain and near-syncope.  Respiratory: Negative for hemoptysis and wheezing.   Endocrine: Negative for cold intolerance and heat intolerance.  Skin: Negative for nail changes.  Musculoskeletal: Positive for back pain and joint pain. Negative for myalgias.  Gastrointestinal: Negative for abdominal pain, nausea and vomiting.  Neurological: Negative for difficulty with concentration, dizziness, focal weakness and headaches.  Psychiatric/Behavioral: Negative for altered mental status and suicidal ideas.  All other systems  reviewed and are negative.     Objective  Blood pressure 114/70, pulse 77, height 6' 2" (1.88 m), weight 233 lb (105.7 kg). Body mass index is 29.92 kg/m.    Physical exam not performed or limited due to virtual visit. Please see exam details from prior visit is as below.   Physical Exam  Constitutional: He appears well-developed and well-nourished.  HENT:  Head: Normocephalic and atraumatic.  Neck: Normal range of motion.  Cardiovascular: Normal rate, regular rhythm and S1 normal.  Pulses:      Carotid pulses are on the right side with bruit and on the left side with bruit.      Femoral pulses are 2+ on the right side and 2+ on the left side.      Popliteal pulses are 1+ on the right side and 1+ on the left side.       Dorsalis pedis pulses are 2+ on the right side and 2+ on the left side.       Posterior tibial pulses are 2+ on the right side and 0 on the left side.  Split S2  Skin: Skin is warm, dry and intact. No cyanosis. No pallor.  Vitals reviewed.  Radiology: No results found.  Laboratory examination:   01/31/2018: Cholesterol 190, triglycerides 68, HDL 49, LDL 127. HgbA1c 6%. Creatinine 0.9, eGFR 100, Potassium 4.5, CMP normal.   CMP Latest Ref Rng & Units 07/04/2015 07/03/2015 07/01/2015  Glucose 65 - 99 mg/dL 110(H) - 90  BUN 6 - 20 mg/dL 11 - 14  Creatinine 0.61 - 1.24 mg/dL 0.97 0.84 0.99  Sodium 135 - 145 mmol/L 140 - 146(H)  Potassium 3.5 - 5.1 mmol/L 4.1 - 4.3  Chloride 101 - 111 mmol/L 105 - 98(L)  CO2 22 - 32 mmol/L 26 - 30  Calcium 8.9 - 10.3 mg/dL 8.4(L) - 10.8(H)  Total Protein 6.5 - 8.1 g/dL - - 7.1  Total Bilirubin 0.3 - 1.2 mg/dL - - 1.3(H)  Alkaline Phos 38 - 126 U/L - - 71  AST 15 - 41 U/L - - 20  ALT 17 - 63 U/L - - 15(L)   CBC Latest Ref Rng & Units 07/04/2015 07/03/2015  07/01/2015  WBC 4.0 - 10.5 K/uL 5.6 7.1 5.1  Hemoglobin 13.0 - 17.0 g/dL 11.7(L) 11.9(L) 13.3  Hematocrit 39.0 - 52.0 % 36.7(L) 36.9(L) 41.0  Platelets 150 - 400 K/uL 152 162  199   Lipid Panel     Component Value Date/Time   CHOL 123 06/19/2015 0625   TRIG 61 06/19/2015 0625   HDL 46 06/19/2015 0625   CHOLHDL 2.7 06/19/2015 0625   VLDL 12 06/19/2015 0625   LDLCALC 65 06/19/2015 0625   HEMOGLOBIN A1C Lab Results  Component Value Date   HGBA1C 6.2 (H) 06/19/2015   MPG 131 06/19/2015   TSH No results for input(s): TSH in the last 8760 hours.  Cardiac Studies:   Echocardiogram 10/27/2016: Left ventricle cavity is normal in size. Moderate concentric hypertrophy of the left ventricle. Normal global wall motion. Normal diastolic filling pattern. Calculated EF 55%. Left atrial cavity is moderately dilated. LA is much larger than the measured AP diameter at 4.1 cm. Trileaflet aortic valve with no regurgitation noted. Mild aortic valve leaflet calcification. Mild (Grade I) mitral regurgitation. Mild mitral valve leaflet calcification. Mild tricuspid regurgitation. No evidence of pulmonary hypertension. Compared to the study done on 08/29/2014 no significant change.  Exercise myoview stress 10/30/2016 1. The resting electrocardiogram demonstrated normal sinus rhythm, RBBB and no resting arrhythmias. The stress electrocardiogram was positive for ischemia with 2 mm ST depression and T inversion in V1 to V3 with peak exercise which resolved at 3 mintues into recovery. Patient exercised on Bruce protocol for 6:15 minutes and achieved 7.05METS. Stress test terminated due to dyspnea and 88 % MPHR achieved (Target HR >85%). 2. The LV is dilated both at rest and stress images. The LV end diastolic volume was [id number].This is an abnormal myocardial perfusion imaging study demonstrating a mixture of scar plus mild ischemia in the basal inferior, basal inferolateral, mid inferior, mid inferolateral, apical inferior and apical lateral myocardial wall(s). Ischemia more prominent in the lateral wall. Gated SPECT imaging demonstrates hypokinesis of the basal inferior, basal  inferolateral, mid inferior and mid inferolateral myocardial wall(s). The left ventricular ejection fraction was calculated to be 40%. Compared to the prior study from 08/31/2014, the current study reveals no significant change. Previously Lexiscan stress performed. This is an intermediate risk study, clinical correlation recommended.  Lower extremity arterial duplex 01/19/2018: Diffuse calcific mild plaque noted throughout the bilateral lower extremity. There is >50% stenosis in the right distal anterior tibial artery. Diffuxe moderately abnormal biphasic waveform in both the lower extremity suggests diffuse disease. This exam reveals normal perfusion of both the lower extremity (ABI 1.00).  Renal artery duplex 10/20/2017: No evidence of renal artery occlusive disease in either renal artery. Normal size both kidneys with normal intrarenal vascular perfusion is noted in the right kidney. Mild diffuse plaque noted in the abdominal aorta.   Assessment   1. Atherosclerosis of native artery of both lower extremities with intermittent claudication [facility]) Patient has intermittent claudication of bilateral lower extremities. He has diffuse disease by lower extremity duplex. Denies any ulcerations or skin color changes. Claudication symptoms stable. I suspect that his claudication symptoms are multifactoral and most likely related to neurogenic claudication from his spinal issues.I have recommended that we continue with aggressive medical management. If symptoms worsen or evidence of ischemic limb, will perform PV angiogram.  2. Resistant hypertension Blood pressure is very well controlled today. Continue with current therapy.  3. Spinal stenosis of lumbar region with neurogenic claudication Suspect that this is the major  etiology for his claudication. Encouraged him to continue to follow up with Dr. Rolena Infante.  4. Atherosclerosis of native coronary artery of native heart without angina pectoris  No symptoms of angina. On appropriate medical therapy. Previous labs show poorly controlled lipids. As he has not had recent labs, will order lipids, cmp, and cbc for evaluation.  5. Dyspnea on exertion Has chronic dyspnea on exertion that is stable. Encouraged him to walk as tolerated for CAD, PAD, and also improve activity tolerance.  6. Carotid stenosis, right Due for follow up with Vascular surgery. If he has not seen them by next office visit, will perform duplex for surveillance.  7. Adenocarcinoma, prostate Starting radiation therapy on 08/29/2018. Currently undergoing Lupron therapy. Followed by Dr. Gloriann Loan and Dr. Tammi Klippel.  Recommendations:   I will notify him of lab results. No changes in medications today, will see him back in 4-5 months.  Miquel Dunn, MD, [facility] 08/23/2018, 10:42 AM [facility] [place] Pager: [phone] Office: [phone] If no answer Cell [phone]

## 2018-08-24 DIAGNOSIS — I70213 Atherosclerosis of native arteries of extremities with intermittent claudication, bilateral legs: Secondary | ICD-10-CM | POA: Diagnosis not present

## 2018-08-24 DIAGNOSIS — C61 Malignant neoplasm of prostate: Secondary | ICD-10-CM | POA: Diagnosis not present

## 2018-08-24 DIAGNOSIS — Z510 Encounter for antineoplastic radiation therapy: Secondary | ICD-10-CM | POA: Diagnosis not present

## 2018-08-24 DIAGNOSIS — I10 Essential (primary) hypertension: Secondary | ICD-10-CM | POA: Diagnosis not present

## 2018-08-24 DIAGNOSIS — I2510 Atherosclerotic heart disease of native coronary artery without angina pectoris: Secondary | ICD-10-CM | POA: Diagnosis not present

## 2018-08-25 LAB — CBC
Hematocrit: 44.6 % (ref 37.5–51.0)
Hemoglobin: 14.6 g/dL (ref 13.0–17.7)
MCH: 28.9 pg (ref 26.6–33.0)
MCHC: 32.7 g/dL (ref 31.5–35.7)
MCV: 88 fL (ref 79–97)
Platelets: 206 10*3/uL (ref 150–450)
RBC: 5.06 x10E6/uL (ref 4.14–5.80)
RDW: 13.1 % (ref 11.6–15.4)
WBC: 4.9 10*3/uL (ref 3.4–10.8)

## 2018-08-25 LAB — COMPREHENSIVE METABOLIC PANEL
ALT: 22 IU/L (ref 0–44)
AST: 22 IU/L (ref 0–40)
Albumin/Globulin Ratio: 1.7 (ref 1.2–2.2)
Albumin: 4.5 g/dL (ref 3.7–4.7)
Alkaline Phosphatase: 76 IU/L (ref 39–117)
BUN/Creatinine Ratio: 24 (ref 10–24)
BUN: 23 mg/dL (ref 8–27)
Bilirubin Total: 0.7 mg/dL (ref 0.0–1.2)
CO2: 25 mmol/L (ref 20–29)
Calcium: 9.7 mg/dL (ref 8.6–10.2)
Chloride: 102 mmol/L (ref 96–106)
Creatinine, Ser: 0.96 mg/dL (ref 0.76–1.27)
GFR calc Af Amer: 91 mL/min/{1.73_m2} (ref >59)
GFR calc non Af Amer: 79 mL/min/{1.73_m2} (ref >59)
Globulin, Total: 2.6 g/dL (ref 1.5–4.5)
Glucose: 115 mg/dL — ABNORMAL HIGH (ref 65–99)
Potassium: 4.7 mmol/L (ref 3.5–5.2)
Sodium: 141 mmol/L (ref 134–144)
Total Protein: 7.1 g/dL (ref 6.0–8.5)

## 2018-08-25 LAB — LIPID PANEL
Chol/HDL Ratio: 2.7 ratio (ref 0.0–5.0)
Cholesterol, Total: 174 mg/dL (ref 100–199)
HDL: 65 mg/dL (ref >39)
LDL Calculated: 94 mg/dL (ref 0–99)
Triglycerides: 76 mg/dL (ref 0–149)
VLDL Cholesterol Cal: 15 mg/dL (ref 5–40)

## 2018-08-30 ENCOUNTER — Ambulatory Visit
Admission: RE | Admit: 2018-08-30 | Discharge: 2018-08-30 | Disposition: A | Discharge status: Home / Self Care | Payer: Medicare Other | Source: Ambulatory Visit | Attending: Radiation Oncology | Admitting: Radiation Oncology

## 2018-08-30 ENCOUNTER — Other Ambulatory Visit: Payer: Self-pay

## 2018-08-30 DIAGNOSIS — C61 Malignant neoplasm of prostate: Secondary | ICD-10-CM | POA: Diagnosis not present

## 2018-08-30 DIAGNOSIS — Z510 Encounter for antineoplastic radiation therapy: Secondary | ICD-10-CM | POA: Diagnosis not present

## 2018-08-31 ENCOUNTER — Other Ambulatory Visit: Payer: Self-pay

## 2018-08-31 ENCOUNTER — Ambulatory Visit
Admission: RE | Admit: 2018-08-31 | Discharge: 2018-08-31 | Disposition: A | Discharge status: Home / Self Care | Payer: Medicare Other | Source: Ambulatory Visit | Attending: Radiation Oncology | Admitting: Radiation Oncology

## 2018-08-31 DIAGNOSIS — C61 Malignant neoplasm of prostate: Secondary | ICD-10-CM | POA: Diagnosis not present

## 2018-08-31 DIAGNOSIS — Z510 Encounter for antineoplastic radiation therapy: Secondary | ICD-10-CM | POA: Diagnosis not present

## 2018-09-01 ENCOUNTER — Other Ambulatory Visit: Payer: Self-pay

## 2018-09-01 ENCOUNTER — Ambulatory Visit
Admission: RE | Admit: 2018-09-01 | Discharge: 2018-09-01 | Disposition: A | Discharge status: Home / Self Care | Payer: Medicare Other | Source: Ambulatory Visit | Attending: Radiation Oncology | Admitting: Radiation Oncology

## 2018-09-01 DIAGNOSIS — C61 Malignant neoplasm of prostate: Secondary | ICD-10-CM | POA: Diagnosis not present

## 2018-09-01 DIAGNOSIS — Z510 Encounter for antineoplastic radiation therapy: Secondary | ICD-10-CM | POA: Diagnosis not present

## 2018-09-02 ENCOUNTER — Other Ambulatory Visit: Payer: Self-pay

## 2018-09-02 ENCOUNTER — Ambulatory Visit
Admission: RE | Admit: 2018-09-02 | Discharge: 2018-09-02 | Disposition: A | Discharge status: Home / Self Care | Payer: Medicare Other | Source: Ambulatory Visit | Attending: Radiation Oncology | Admitting: Radiation Oncology

## 2018-09-02 DIAGNOSIS — C61 Malignant neoplasm of prostate: Secondary | ICD-10-CM | POA: Diagnosis not present

## 2018-09-02 DIAGNOSIS — Z510 Encounter for antineoplastic radiation therapy: Secondary | ICD-10-CM | POA: Diagnosis not present

## 2018-09-05 ENCOUNTER — Other Ambulatory Visit: Payer: Self-pay

## 2018-09-05 ENCOUNTER — Ambulatory Visit
Admission: RE | Admit: 2018-09-05 | Discharge: 2018-09-05 | Disposition: A | Discharge status: Home / Self Care | Payer: Medicare Other | Source: Ambulatory Visit | Attending: Radiation Oncology | Admitting: Radiation Oncology

## 2018-09-05 DIAGNOSIS — Z510 Encounter for antineoplastic radiation therapy: Secondary | ICD-10-CM | POA: Diagnosis not present

## 2018-09-05 DIAGNOSIS — C61 Malignant neoplasm of prostate: Secondary | ICD-10-CM | POA: Diagnosis not present

## 2018-09-06 ENCOUNTER — Ambulatory Visit
Admission: RE | Admit: 2018-09-06 | Discharge: 2018-09-06 | Disposition: A | Discharge status: Home / Self Care | Payer: Medicare Other | Source: Ambulatory Visit | Attending: Radiation Oncology | Admitting: Radiation Oncology

## 2018-09-06 ENCOUNTER — Other Ambulatory Visit: Payer: Self-pay

## 2018-09-06 DIAGNOSIS — Z510 Encounter for antineoplastic radiation therapy: Secondary | ICD-10-CM | POA: Diagnosis not present

## 2018-09-06 DIAGNOSIS — C61 Malignant neoplasm of prostate: Secondary | ICD-10-CM | POA: Diagnosis not present

## 2018-09-07 ENCOUNTER — Ambulatory Visit
Admission: RE | Admit: 2018-09-07 | Discharge: 2018-09-07 | Disposition: A | Discharge status: Home / Self Care | Payer: Medicare Other | Source: Ambulatory Visit | Attending: Radiation Oncology | Admitting: Radiation Oncology

## 2018-09-07 ENCOUNTER — Other Ambulatory Visit: Payer: Self-pay

## 2018-09-07 DIAGNOSIS — C61 Malignant neoplasm of prostate: Secondary | ICD-10-CM | POA: Diagnosis not present

## 2018-09-07 DIAGNOSIS — Z510 Encounter for antineoplastic radiation therapy: Secondary | ICD-10-CM | POA: Diagnosis not present

## 2018-09-08 ENCOUNTER — Other Ambulatory Visit: Payer: Self-pay

## 2018-09-08 ENCOUNTER — Other Ambulatory Visit: Payer: Self-pay | Admitting: Cardiology

## 2018-09-08 ENCOUNTER — Ambulatory Visit
Admission: RE | Admit: 2018-09-08 | Discharge: 2018-09-08 | Disposition: A | Discharge status: Home / Self Care | Payer: Medicare Other | Source: Ambulatory Visit | Attending: Radiation Oncology | Admitting: Radiation Oncology

## 2018-09-08 DIAGNOSIS — C61 Malignant neoplasm of prostate: Secondary | ICD-10-CM | POA: Diagnosis not present

## 2018-09-08 DIAGNOSIS — Z510 Encounter for antineoplastic radiation therapy: Secondary | ICD-10-CM | POA: Diagnosis not present

## 2018-09-08 DIAGNOSIS — I70213 Atherosclerosis of native arteries of extremities with intermittent claudication, bilateral legs: Secondary | ICD-10-CM

## 2018-09-08 NOTE — Telephone Encounter (Signed)
Please fill

## 2018-09-09 ENCOUNTER — Ambulatory Visit
Admission: RE | Admit: 2018-09-09 | Discharge: 2018-09-09 | Disposition: A | Discharge status: Home / Self Care | Payer: Medicare Other | Source: Ambulatory Visit | Attending: Radiation Oncology | Admitting: Radiation Oncology

## 2018-09-09 ENCOUNTER — Other Ambulatory Visit: Payer: Self-pay

## 2018-09-09 DIAGNOSIS — Z510 Encounter for antineoplastic radiation therapy: Secondary | ICD-10-CM | POA: Diagnosis not present

## 2018-09-09 DIAGNOSIS — C61 Malignant neoplasm of prostate: Secondary | ICD-10-CM | POA: Diagnosis not present

## 2018-09-13 ENCOUNTER — Other Ambulatory Visit: Payer: Self-pay

## 2018-09-13 ENCOUNTER — Ambulatory Visit
Admission: RE | Admit: 2018-09-13 | Discharge: 2018-09-13 | Disposition: A | Discharge status: Home / Self Care | Payer: Medicare Other | Source: Ambulatory Visit | Attending: Radiation Oncology | Admitting: Radiation Oncology

## 2018-09-13 DIAGNOSIS — Z510 Encounter for antineoplastic radiation therapy: Secondary | ICD-10-CM | POA: Diagnosis not present

## 2018-09-13 DIAGNOSIS — C61 Malignant neoplasm of prostate: Secondary | ICD-10-CM | POA: Diagnosis not present

## 2018-09-14 ENCOUNTER — Ambulatory Visit
Admission: RE | Admit: 2018-09-14 | Discharge: 2018-09-14 | Disposition: A | Discharge status: Home / Self Care | Payer: Medicare Other | Source: Ambulatory Visit | Attending: Radiation Oncology | Admitting: Radiation Oncology

## 2018-09-14 ENCOUNTER — Other Ambulatory Visit: Payer: Self-pay

## 2018-09-14 DIAGNOSIS — C61 Malignant neoplasm of prostate: Secondary | ICD-10-CM | POA: Diagnosis not present

## 2018-09-14 DIAGNOSIS — Z510 Encounter for antineoplastic radiation therapy: Secondary | ICD-10-CM | POA: Diagnosis not present

## 2018-09-15 ENCOUNTER — Ambulatory Visit
Admission: RE | Admit: 2018-09-15 | Discharge: 2018-09-15 | Disposition: A | Discharge status: Home / Self Care | Payer: Medicare Other | Source: Ambulatory Visit | Attending: Radiation Oncology | Admitting: Radiation Oncology

## 2018-09-15 ENCOUNTER — Other Ambulatory Visit: Payer: Self-pay

## 2018-09-15 DIAGNOSIS — C61 Malignant neoplasm of prostate: Secondary | ICD-10-CM | POA: Diagnosis not present

## 2018-09-15 DIAGNOSIS — Z510 Encounter for antineoplastic radiation therapy: Secondary | ICD-10-CM | POA: Diagnosis not present

## 2018-09-16 ENCOUNTER — Other Ambulatory Visit: Payer: Self-pay

## 2018-09-16 ENCOUNTER — Ambulatory Visit
Admission: RE | Admit: 2018-09-16 | Discharge: 2018-09-16 | Disposition: A | Discharge status: Home / Self Care | Payer: Medicare Other | Source: Ambulatory Visit | Attending: Radiation Oncology | Admitting: Radiation Oncology

## 2018-09-16 DIAGNOSIS — C61 Malignant neoplasm of prostate: Secondary | ICD-10-CM | POA: Diagnosis not present

## 2018-09-16 DIAGNOSIS — Z510 Encounter for antineoplastic radiation therapy: Secondary | ICD-10-CM | POA: Diagnosis not present

## 2018-09-19 ENCOUNTER — Ambulatory Visit
Admission: RE | Admit: 2018-09-19 | Discharge: 2018-09-19 | Disposition: A | Discharge status: Home / Self Care | Payer: Medicare Other | Source: Ambulatory Visit | Attending: Radiation Oncology | Admitting: Radiation Oncology

## 2018-09-19 ENCOUNTER — Other Ambulatory Visit: Payer: Self-pay

## 2018-09-19 DIAGNOSIS — C61 Malignant neoplasm of prostate: Secondary | ICD-10-CM | POA: Insufficient documentation

## 2018-09-19 DIAGNOSIS — Z510 Encounter for antineoplastic radiation therapy: Secondary | ICD-10-CM | POA: Insufficient documentation

## 2018-09-20 ENCOUNTER — Other Ambulatory Visit: Payer: Self-pay

## 2018-09-20 ENCOUNTER — Ambulatory Visit
Admission: RE | Admit: 2018-09-20 | Discharge: 2018-09-20 | Disposition: A | Discharge status: Home / Self Care | Payer: Medicare Other | Source: Ambulatory Visit | Attending: Radiation Oncology | Admitting: Radiation Oncology

## 2018-09-20 DIAGNOSIS — C61 Malignant neoplasm of prostate: Secondary | ICD-10-CM | POA: Diagnosis not present

## 2018-09-20 DIAGNOSIS — Z510 Encounter for antineoplastic radiation therapy: Secondary | ICD-10-CM | POA: Diagnosis not present

## 2018-09-21 ENCOUNTER — Other Ambulatory Visit: Payer: Self-pay

## 2018-09-21 ENCOUNTER — Ambulatory Visit
Admission: RE | Admit: 2018-09-21 | Discharge: 2018-09-21 | Disposition: A | Discharge status: Home / Self Care | Payer: Medicare Other | Source: Ambulatory Visit | Attending: Radiation Oncology | Admitting: Radiation Oncology

## 2018-09-21 DIAGNOSIS — C61 Malignant neoplasm of prostate: Secondary | ICD-10-CM | POA: Diagnosis not present

## 2018-09-21 DIAGNOSIS — Z510 Encounter for antineoplastic radiation therapy: Secondary | ICD-10-CM | POA: Diagnosis not present

## 2018-09-22 ENCOUNTER — Ambulatory Visit
Admission: RE | Admit: 2018-09-22 | Discharge: 2018-09-22 | Disposition: A | Discharge status: Home / Self Care | Payer: Medicare Other | Source: Ambulatory Visit | Attending: Radiation Oncology | Admitting: Radiation Oncology

## 2018-09-22 ENCOUNTER — Other Ambulatory Visit: Payer: Self-pay

## 2018-09-22 DIAGNOSIS — Z510 Encounter for antineoplastic radiation therapy: Secondary | ICD-10-CM | POA: Diagnosis not present

## 2018-09-22 DIAGNOSIS — C61 Malignant neoplasm of prostate: Secondary | ICD-10-CM | POA: Diagnosis not present

## 2018-09-23 ENCOUNTER — Other Ambulatory Visit: Payer: Self-pay

## 2018-09-23 ENCOUNTER — Ambulatory Visit
Admission: RE | Admit: 2018-09-23 | Discharge: 2018-09-23 | Disposition: A | Discharge status: Home / Self Care | Payer: Medicare Other | Source: Ambulatory Visit | Attending: Radiation Oncology | Admitting: Radiation Oncology

## 2018-09-23 DIAGNOSIS — Z510 Encounter for antineoplastic radiation therapy: Secondary | ICD-10-CM | POA: Diagnosis not present

## 2018-09-23 DIAGNOSIS — C61 Malignant neoplasm of prostate: Secondary | ICD-10-CM | POA: Diagnosis not present

## 2018-09-26 ENCOUNTER — Other Ambulatory Visit: Payer: Self-pay

## 2018-09-26 ENCOUNTER — Ambulatory Visit
Admission: RE | Admit: 2018-09-26 | Discharge: 2018-09-26 | Disposition: A | Discharge status: Home / Self Care | Payer: Medicare Other | Source: Ambulatory Visit | Attending: Radiation Oncology | Admitting: Radiation Oncology

## 2018-09-26 DIAGNOSIS — C61 Malignant neoplasm of prostate: Secondary | ICD-10-CM | POA: Diagnosis not present

## 2018-09-26 DIAGNOSIS — Z510 Encounter for antineoplastic radiation therapy: Secondary | ICD-10-CM | POA: Diagnosis not present

## 2018-09-27 ENCOUNTER — Ambulatory Visit
Admission: RE | Admit: 2018-09-27 | Discharge: 2018-09-27 | Disposition: A | Discharge status: Home / Self Care | Payer: Medicare Other | Source: Ambulatory Visit | Attending: Radiation Oncology | Admitting: Radiation Oncology

## 2018-09-27 ENCOUNTER — Other Ambulatory Visit: Payer: Self-pay

## 2018-09-27 DIAGNOSIS — Z510 Encounter for antineoplastic radiation therapy: Secondary | ICD-10-CM | POA: Diagnosis not present

## 2018-09-27 DIAGNOSIS — C61 Malignant neoplasm of prostate: Secondary | ICD-10-CM | POA: Diagnosis not present

## 2018-09-28 ENCOUNTER — Other Ambulatory Visit: Payer: Self-pay

## 2018-09-28 ENCOUNTER — Ambulatory Visit
Admission: RE | Admit: 2018-09-28 | Discharge: 2018-09-28 | Disposition: A | Discharge status: Home / Self Care | Payer: Medicare Other | Source: Ambulatory Visit | Attending: Radiation Oncology | Admitting: Radiation Oncology

## 2018-09-28 DIAGNOSIS — C61 Malignant neoplasm of prostate: Secondary | ICD-10-CM | POA: Diagnosis not present

## 2018-09-28 DIAGNOSIS — Z510 Encounter for antineoplastic radiation therapy: Secondary | ICD-10-CM | POA: Diagnosis not present

## 2018-09-29 ENCOUNTER — Ambulatory Visit
Admission: RE | Admit: 2018-09-29 | Discharge: 2018-09-29 | Disposition: A | Discharge status: Home / Self Care | Payer: Medicare Other | Source: Ambulatory Visit | Attending: Radiation Oncology | Admitting: Radiation Oncology

## 2018-09-29 DIAGNOSIS — C61 Malignant neoplasm of prostate: Secondary | ICD-10-CM | POA: Diagnosis not present

## 2018-09-29 DIAGNOSIS — Z510 Encounter for antineoplastic radiation therapy: Secondary | ICD-10-CM | POA: Diagnosis not present

## 2018-09-30 ENCOUNTER — Ambulatory Visit
Admission: RE | Admit: 2018-09-30 | Discharge: 2018-09-30 | Disposition: A | Discharge status: Home / Self Care | Payer: Medicare Other | Source: Ambulatory Visit | Attending: Radiation Oncology | Admitting: Radiation Oncology

## 2018-09-30 ENCOUNTER — Other Ambulatory Visit: Payer: Self-pay

## 2018-09-30 DIAGNOSIS — C61 Malignant neoplasm of prostate: Secondary | ICD-10-CM | POA: Diagnosis not present

## 2018-09-30 DIAGNOSIS — Z510 Encounter for antineoplastic radiation therapy: Secondary | ICD-10-CM | POA: Diagnosis not present

## 2018-10-03 ENCOUNTER — Other Ambulatory Visit: Payer: Self-pay

## 2018-10-03 ENCOUNTER — Ambulatory Visit
Admission: RE | Admit: 2018-10-03 | Discharge: 2018-10-03 | Disposition: A | Discharge status: Home / Self Care | Payer: Medicare Other | Source: Ambulatory Visit | Attending: Radiation Oncology | Admitting: Radiation Oncology

## 2018-10-03 DIAGNOSIS — Z510 Encounter for antineoplastic radiation therapy: Secondary | ICD-10-CM | POA: Diagnosis not present

## 2018-10-03 DIAGNOSIS — C61 Malignant neoplasm of prostate: Secondary | ICD-10-CM | POA: Diagnosis not present

## 2018-10-04 ENCOUNTER — Other Ambulatory Visit: Payer: Self-pay

## 2018-10-04 ENCOUNTER — Ambulatory Visit: Payer: Medicare Other

## 2018-10-04 ENCOUNTER — Ambulatory Visit
Admission: RE | Admit: 2018-10-04 | Discharge: 2018-10-04 | Disposition: A | Discharge status: Home / Self Care | Payer: Medicare Other | Source: Ambulatory Visit | Attending: Radiation Oncology | Admitting: Radiation Oncology

## 2018-10-04 DIAGNOSIS — Z510 Encounter for antineoplastic radiation therapy: Secondary | ICD-10-CM | POA: Diagnosis not present

## 2018-10-04 DIAGNOSIS — C61 Malignant neoplasm of prostate: Secondary | ICD-10-CM | POA: Diagnosis not present

## 2018-10-05 ENCOUNTER — Other Ambulatory Visit: Payer: Self-pay

## 2018-10-05 ENCOUNTER — Ambulatory Visit
Admission: RE | Admit: 2018-10-05 | Discharge: 2018-10-05 | Disposition: A | Discharge status: Home / Self Care | Payer: Medicare Other | Source: Ambulatory Visit | Attending: Radiation Oncology | Admitting: Radiation Oncology

## 2018-10-05 DIAGNOSIS — Z510 Encounter for antineoplastic radiation therapy: Secondary | ICD-10-CM | POA: Diagnosis not present

## 2018-10-05 DIAGNOSIS — C61 Malignant neoplasm of prostate: Secondary | ICD-10-CM | POA: Diagnosis not present

## 2018-10-06 ENCOUNTER — Other Ambulatory Visit: Payer: Self-pay

## 2018-10-06 ENCOUNTER — Ambulatory Visit
Admission: RE | Admit: 2018-10-06 | Discharge: 2018-10-06 | Disposition: A | Discharge status: Home / Self Care | Payer: Medicare Other | Source: Ambulatory Visit | Attending: Radiation Oncology | Admitting: Radiation Oncology

## 2018-10-06 DIAGNOSIS — C61 Malignant neoplasm of prostate: Secondary | ICD-10-CM | POA: Diagnosis not present

## 2018-10-06 DIAGNOSIS — Z510 Encounter for antineoplastic radiation therapy: Secondary | ICD-10-CM | POA: Diagnosis not present

## 2018-10-07 ENCOUNTER — Other Ambulatory Visit: Payer: Self-pay

## 2018-10-07 ENCOUNTER — Ambulatory Visit
Admission: RE | Admit: 2018-10-07 | Discharge: 2018-10-07 | Disposition: A | Discharge status: Home / Self Care | Payer: Medicare Other | Source: Ambulatory Visit | Attending: Radiation Oncology | Admitting: Radiation Oncology

## 2018-10-07 DIAGNOSIS — C61 Malignant neoplasm of prostate: Secondary | ICD-10-CM | POA: Diagnosis not present

## 2018-10-07 DIAGNOSIS — Z510 Encounter for antineoplastic radiation therapy: Secondary | ICD-10-CM | POA: Diagnosis not present

## 2018-10-10 ENCOUNTER — Ambulatory Visit
Admission: RE | Admit: 2018-10-10 | Discharge: 2018-10-10 | Disposition: A | Discharge status: Home / Self Care | Payer: Medicare Other | Source: Ambulatory Visit | Attending: Radiation Oncology | Admitting: Radiation Oncology

## 2018-10-10 ENCOUNTER — Other Ambulatory Visit: Payer: Self-pay

## 2018-10-10 DIAGNOSIS — Z510 Encounter for antineoplastic radiation therapy: Secondary | ICD-10-CM | POA: Diagnosis not present

## 2018-10-10 DIAGNOSIS — C61 Malignant neoplasm of prostate: Secondary | ICD-10-CM | POA: Diagnosis not present

## 2018-10-11 ENCOUNTER — Ambulatory Visit
Admission: RE | Admit: 2018-10-11 | Discharge: 2018-10-11 | Disposition: A | Discharge status: Home / Self Care | Payer: Medicare Other | Source: Ambulatory Visit | Attending: Radiation Oncology | Admitting: Radiation Oncology

## 2018-10-11 ENCOUNTER — Other Ambulatory Visit: Payer: Self-pay

## 2018-10-11 DIAGNOSIS — Z510 Encounter for antineoplastic radiation therapy: Secondary | ICD-10-CM | POA: Diagnosis not present

## 2018-10-11 DIAGNOSIS — C61 Malignant neoplasm of prostate: Secondary | ICD-10-CM | POA: Diagnosis not present

## 2018-10-12 ENCOUNTER — Ambulatory Visit
Admission: RE | Admit: 2018-10-12 | Discharge: 2018-10-12 | Disposition: A | Discharge status: Home / Self Care | Payer: Medicare Other | Source: Ambulatory Visit | Attending: Radiation Oncology | Admitting: Radiation Oncology

## 2018-10-12 ENCOUNTER — Other Ambulatory Visit: Payer: Self-pay

## 2018-10-12 DIAGNOSIS — C61 Malignant neoplasm of prostate: Secondary | ICD-10-CM | POA: Diagnosis not present

## 2018-10-12 DIAGNOSIS — Z510 Encounter for antineoplastic radiation therapy: Secondary | ICD-10-CM | POA: Diagnosis not present

## 2018-10-13 ENCOUNTER — Ambulatory Visit
Admission: RE | Admit: 2018-10-13 | Discharge: 2018-10-13 | Disposition: A | Discharge status: Home / Self Care | Payer: Medicare Other | Source: Ambulatory Visit | Attending: Radiation Oncology | Admitting: Radiation Oncology

## 2018-10-13 ENCOUNTER — Other Ambulatory Visit: Payer: Self-pay

## 2018-10-13 DIAGNOSIS — C61 Malignant neoplasm of prostate: Secondary | ICD-10-CM | POA: Diagnosis not present

## 2018-10-13 DIAGNOSIS — Z510 Encounter for antineoplastic radiation therapy: Secondary | ICD-10-CM | POA: Diagnosis not present

## 2018-10-14 ENCOUNTER — Ambulatory Visit
Admission: RE | Admit: 2018-10-14 | Discharge: 2018-10-14 | Disposition: A | Discharge status: Home / Self Care | Payer: Medicare Other | Source: Ambulatory Visit | Attending: Radiation Oncology | Admitting: Radiation Oncology

## 2018-10-14 ENCOUNTER — Other Ambulatory Visit: Payer: Self-pay

## 2018-10-14 DIAGNOSIS — N5231 Erectile dysfunction following radical prostatectomy: Secondary | ICD-10-CM | POA: Diagnosis not present

## 2018-10-14 DIAGNOSIS — R350 Frequency of micturition: Secondary | ICD-10-CM | POA: Diagnosis not present

## 2018-10-14 DIAGNOSIS — Z510 Encounter for antineoplastic radiation therapy: Secondary | ICD-10-CM | POA: Diagnosis not present

## 2018-10-14 DIAGNOSIS — C61 Malignant neoplasm of prostate: Secondary | ICD-10-CM | POA: Diagnosis not present

## 2018-10-14 DIAGNOSIS — R3915 Urgency of urination: Secondary | ICD-10-CM | POA: Diagnosis not present

## 2018-10-14 DIAGNOSIS — R351 Nocturia: Secondary | ICD-10-CM | POA: Diagnosis not present

## 2018-10-17 ENCOUNTER — Ambulatory Visit
Admission: RE | Admit: 2018-10-17 | Discharge: 2018-10-17 | Disposition: A | Discharge status: Home / Self Care | Payer: Medicare Other | Source: Ambulatory Visit | Attending: Radiation Oncology | Admitting: Radiation Oncology

## 2018-10-17 ENCOUNTER — Other Ambulatory Visit: Payer: Self-pay

## 2018-10-17 DIAGNOSIS — Z510 Encounter for antineoplastic radiation therapy: Secondary | ICD-10-CM | POA: Diagnosis not present

## 2018-10-17 DIAGNOSIS — C61 Malignant neoplasm of prostate: Secondary | ICD-10-CM | POA: Diagnosis not present

## 2018-10-18 ENCOUNTER — Ambulatory Visit
Admission: RE | Admit: 2018-10-18 | Discharge: 2018-10-18 | Disposition: A | Discharge status: Home / Self Care | Payer: Medicare Other | Source: Ambulatory Visit | Attending: Radiation Oncology | Admitting: Radiation Oncology

## 2018-10-18 ENCOUNTER — Other Ambulatory Visit: Payer: Self-pay

## 2018-10-18 DIAGNOSIS — C61 Malignant neoplasm of prostate: Secondary | ICD-10-CM | POA: Diagnosis not present

## 2018-10-18 DIAGNOSIS — Z510 Encounter for antineoplastic radiation therapy: Secondary | ICD-10-CM | POA: Diagnosis not present

## 2018-10-19 ENCOUNTER — Other Ambulatory Visit: Payer: Self-pay

## 2018-10-19 ENCOUNTER — Ambulatory Visit
Admission: RE | Admit: 2018-10-19 | Discharge: 2018-10-19 | Disposition: A | Discharge status: Home / Self Care | Payer: Medicare Other | Source: Ambulatory Visit | Attending: Radiation Oncology | Admitting: Radiation Oncology

## 2018-10-19 DIAGNOSIS — Z510 Encounter for antineoplastic radiation therapy: Secondary | ICD-10-CM | POA: Insufficient documentation

## 2018-10-19 DIAGNOSIS — C61 Malignant neoplasm of prostate: Secondary | ICD-10-CM | POA: Diagnosis not present

## 2018-10-20 ENCOUNTER — Other Ambulatory Visit: Payer: Self-pay

## 2018-10-20 ENCOUNTER — Ambulatory Visit
Admission: RE | Admit: 2018-10-20 | Discharge: 2018-10-20 | Disposition: A | Discharge status: Home / Self Care | Payer: Medicare Other | Source: Ambulatory Visit | Attending: Radiation Oncology | Admitting: Radiation Oncology

## 2018-10-20 DIAGNOSIS — C61 Malignant neoplasm of prostate: Secondary | ICD-10-CM | POA: Diagnosis not present

## 2018-10-20 DIAGNOSIS — Z510 Encounter for antineoplastic radiation therapy: Secondary | ICD-10-CM | POA: Diagnosis not present

## 2018-10-24 ENCOUNTER — Encounter: Payer: Self-pay | Admitting: Radiation Oncology

## 2018-10-24 ENCOUNTER — Other Ambulatory Visit: Payer: Self-pay

## 2018-10-24 ENCOUNTER — Ambulatory Visit
Admission: RE | Admit: 2018-10-24 | Discharge: 2018-10-24 | Disposition: A | Discharge status: Home / Self Care | Payer: Medicare Other | Source: Ambulatory Visit | Attending: Radiation Oncology | Admitting: Radiation Oncology

## 2018-10-24 DIAGNOSIS — C61 Malignant neoplasm of prostate: Secondary | ICD-10-CM | POA: Diagnosis not present

## 2018-10-24 DIAGNOSIS — Z510 Encounter for antineoplastic radiation therapy: Secondary | ICD-10-CM | POA: Diagnosis not present

## 2018-11-08 NOTE — Progress Notes (Signed)
°  Radiation Oncology         (336) [phone] ________________________________  Name: [name] MRN: [id number]  Date: 10/24/2018  DOB: 1945-10-10  End of Treatment Note  Diagnosis:   73 y.o. male with biochemical recurrence of Gleason 4+3adenocarcinoma of the prostate status post RALP in 2008 with current detectable PSA of 0.49  Indication for treatment:  Curative, Definitive Radiotherapy       Radiation treatment dates:   08/30/2018 - 10/24/2018  Site/dose:  1. The prostate fossa and pelvic lymph nodes were initially treated to 45 Gy in 25 fractions of 1.8 Gy  2. The prostate fossa only was boosted to 68.4 Gy with 13 additional fractions of 1.8 Gy   Beams/energy:  1. The prostate fossa and pelvic lymph nodes were initially treated using VMAT intensity modulated radiotherapy delivering 6 megavolt photons. Image guidance was performed with CB-CT studies prior to each fraction. He was immobilized with a body fix lower extremity mold.  2. The prostate fossa only was boosted using VMAT intensity modulated radiotherapy delivering 6 megavolt photons. Image guidance was performed with CB-CT studies prior to each fraction. He was immobilized with a body fix lower extremity mold.  Narrative: The patient tolerated radiation treatment relatively well.   The patient experienced modest fatigue and some minor urinary irritation with urgency, weak stream, incomplete emptying, nocturia x4, and dysuria. He denied having hematuria. He noted more frequent bowel movements.  Plan: The patient has completed radiation treatment. He will return to radiation oncology clinic for routine followup in one month. I advised him to call or return sooner if he has any questions or concerns related to his recovery or treatment. ________________________________  Sheral Apley. Tammi Klippel, M.D.  This document serves as a record of services personally performed by Tyler Pita, MD. It was created on his behalf by Rae Lips, a trained medical scribe. The creation of this record is based on the scribe's personal observations and the provider's statements to them. This document has been checked and approved by the attending provider.

## 2018-12-01 ENCOUNTER — Ambulatory Visit
Admission: RE | Admit: 2018-12-01 | Discharge: 2018-12-01 | Disposition: A | Discharge status: Home / Self Care | Payer: Medicare Other | Source: Ambulatory Visit | Attending: Urology | Admitting: Urology

## 2018-12-01 ENCOUNTER — Other Ambulatory Visit: Payer: Self-pay

## 2018-12-01 DIAGNOSIS — C61 Malignant neoplasm of prostate: Secondary | ICD-10-CM

## 2018-12-01 NOTE — Progress Notes (Signed)
Radiation Oncology         [phone]) [phone] ________________________________  Name: [name] MRN: [id number]  Date: 12/01/2018  DOB: June 02, 1945  Post Treatment Note  CC: Maurice Small, MD  Lucas Mallow, MD  Diagnosis:   73 y.o. male with biochemical recurrence of Gleason 4+3adenocarcinoma of the prostate status post RALP in 2008 with current detectable PSA of 0.49  Interval Since Last Radiation:  5.5 weeks  08/30/2018 - 10/24/2018: 1. The prostate fossa and pelvic lymph nodes were initially treated to 45 Gy in 25 fractions of 1.8 Gy  2. The prostate fossa only was boosted to 68.4 Gy with 13 additional fractions of 1.8 Gy   Narrative:  I spoke with the patient to conduct his routine scheduled 1 month follow up visit via telephone to spare the patient unnecessary potential exposure in the healthcare setting during the current COVID-19 pandemic.  The patient was notified in advance and gave permission to proceed with this visit format. He tolerated radiation treatment relatively well.   The patient experienced modest fatigue and some minor urinary irritation with urgency, weak stream, incomplete emptying, nocturia x4, and occasional dysuria. He denied having gross hematuria or incontinence but did experience more frequent bowel movements.                              On review of systems, the patient states that he is doing well overall.  He continues with nocturia 2-4 times per night which is interfering with his sleep and contributing to him being more tired throughout the day.  Otherwise, during the daytime he denies excessive frequency or urgency and feels that he is emptying his bladder well when voiding.  He continues with occasional stinging at the beginning of his stream but otherwise denies dysuria, gross hematuria, fevers, chills or incontinence.  He reports a healthy appetite and is maintaining his weight.  He denies abdominal pain, nausea, vomiting, diarrhea or constipation.   He does have persistent modest fatigue which is likely a combination of factors relating to his ADT as well as the nocturia.  He has been taking oxybutynin 5 mg p.o. nightly just before bed so we discussed a trial of taking this medication after supper in the evenings to see if this will allow him to get more rest at night.  Otherwise, he is quite pleased with his progress to date.  ALLERGIES:  is allergic to lisinopril.  Meds: Current Outpatient Medications  Medication Sig Dispense Refill  . amLODipine (NORVASC) 10 MG tablet Take 1 tablet (10 mg total) by mouth daily. 30 tablet 11  . aspirin EC 81 MG tablet Take 81 mg by mouth daily.    Marland Kitchen atorvastatin (LIPITOR) 80 MG tablet Take 1 tablet (80 mg total) by mouth daily at 6 PM. 90 tablet 1  . baclofen (LIORESAL) 20 MG tablet Take 20 mg by mouth 3 (three) times daily.    Marland Kitchen ezetimibe (ZETIA) 10 MG tablet TAKE 1 TABLET BY MOUTH DAILY 90 tablet 1  . gabapentin (NEURONTIN) 300 MG capsule Take 300 mg by mouth 2 (two) times daily. Patient only taking once a day    . losartan-hydrochlorothiazide (HYZAAR) 100-12.5 MG tablet     . metFORMIN (GLUCOPHAGE) 500 MG tablet Take 500 mg by mouth daily with breakfast.   0  . metoprolol succinate (TOPROL-XL) 25 MG 24 hr tablet TAKE 1 TABLET BY MOUTH DAILY 90 tablet 1  .  Naproxen Sodium (ALEVE PO) Take by mouth.    . oxybutynin (DITROPAN-XL) 5 MG 24 hr tablet TK 1 T PO QHS     No current facility-administered medications for this encounter.     Physical Findings:  vitals were not taken for this visit.   /Unable to assess due to telephone follow up visit format.  Lab Findings: Lab Results  Component Value Date   WBC 4.9 08/24/2018   HGB 14.6 08/24/2018   HCT 44.6 08/24/2018   MCV 88 08/24/2018   PLT 206 08/24/2018     Radiographic Findings: No results found.  Impression/Plan: 1. 73 y.o. male with biochemical recurrence of Gleason 4+3adenocarcinoma of the prostate status post RALP in 2008 with  current detectable PSA of 0.49. He will continue to follow up with urology for ongoing PSA determinations and has an appointment scheduled with Dr. Gloriann Loan in early September 2020. He understands what to expect with regards to PSA monitoring going forward. He has been taking oxybutynin 5 mg p.o. nightly just before bed so we discussed a trial of taking this medication after supper in the evenings to see if this will allow him to get more rest at night.  I advised him to call my office if this is not successful and I am happy to send a prescription for Myrbetriq 25 mg to trial prior to his follow-up visit with Dr. Gloriann Loan. I will look forward to following his response to treatment via correspondence with urology, and would be happy to continue to participate in his care if clinically indicated. I talked to the patient about what to expect in the future, including his risk for erectile dysfunction and rectal bleeding. I encouraged him to call or return to the office if he has any questions regarding his previous radiation or possible radiation side effects. He was comfortable with this plan and will follow up as needed.    Nicholos Johns, PA-C

## 2018-12-19 ENCOUNTER — Other Ambulatory Visit: Payer: Self-pay | Admitting: Cardiology

## 2018-12-28 ENCOUNTER — Other Ambulatory Visit: Payer: Self-pay

## 2018-12-28 ENCOUNTER — Ambulatory Visit (INDEPENDENT_AMBULATORY_CARE_PROVIDER_SITE_OTHER): Payer: Medicare Other | Admitting: Cardiology

## 2018-12-28 ENCOUNTER — Encounter: Payer: Self-pay | Admitting: Cardiology

## 2018-12-28 VITALS — BP 124/68 | HR 75 | Temp 96.6°F | Ht 73.0 in | Wt 237.0 lb

## 2018-12-28 DIAGNOSIS — I2510 Atherosclerotic heart disease of native coronary artery without angina pectoris: Secondary | ICD-10-CM | POA: Diagnosis not present

## 2018-12-28 DIAGNOSIS — I6521 Occlusion and stenosis of right carotid artery: Secondary | ICD-10-CM | POA: Diagnosis not present

## 2018-12-28 NOTE — Progress Notes (Signed)
Primary Physician/Referring:  Maurice Small, MD  Patient ID: DRAKO MAESE, male    DOB: [birth year], 73 y.o.   MRN: [id number]  Chief Complaint  Patient presents with  . Coronary Artery Disease  . Follow-up    22mo    HPI: [name] is a 73y.o. 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 BTrula Sladein March 2017. History of recurrence prostate adenocarcinoma s/p RALP in 2008 and again recently in March 2020.   Patient has resistant hypertension and renal artery duplex in July 2019 was negative for renal artery stenosis. He has a history of noncompliance with medication, and does admit to occasionally missing a dose of his medications.  He does have sleep apnea by sleep study in 2018; however, states that he had difficulty with the mask and does not use this.  This is a 3 month follow-up for claudication symptoms.  He has been evaluated by Dr. BRolena Infantehe feels that his symptoms of claudication are related to spinal stenosis.  He reports symptoms of claudication are stable.  He does have right leg cramping at night with certain position changes.  Has not been able to tolerate gabapentin.  Symptoms improve with taking ibuprofen.  No ulcerations.  He has been walking regularly and states he tolerates it well, but his low back pain does limit his activity.  Denies any exertional chest pain or dyspnea on exertion.  He has not had since being off gabapentin.  Unfortunately, he has been found to have recurrent biochemical prostate adenocarcinoma in March 2020. Undergoing salvage radiation treatment in combination with ADT. States he has just finished his last radiation treatment, but is to follow up in the next few weeks if further management will be needed.   Past Medical History:  Diagnosis Date  . Anxiety   . ASCVD (arteriosclerotic cardiovascular disease)    03/2004: DES x2 to the RCA; IMI with stent occlusion in 02/2005- suboptimal Plavix compliance  . CAD (coronary artery disease)   . Carcinoma of prostate ([facility]    Clopidogrel held for biopsy  . Constipation due to pain medication   . Diabetes mellitus without complication ([facility]   . Diabetic neuropathy ([facility]   . Hyperlipidemia   . Hypertension   . Mild depression ([facility]   . Morbid obesity ([facility]   . Myocardial infarction ([facility]   . Shortness of breath dyspnea   . Stroke ([facility]   . Tobacco abuse    stopped smoking 06/28/09    Past Surgical History:  Procedure Laterality Date  . BACK SURGERY    . CARDIAC CATHETERIZATION     X 1 stent before having CABG  . CORONARY ARTERY BYPASS GRAFT  06/28/2008   X4  . ENDARTERECTOMY Right 07/03/2015   Procedure: RIGHT CAROTID ENDARTERECTOMY WITH BOVINE PERICARDIUM PATCH ANGIOPLASTY;  Surgeon: VSerafina Mitchell MD;  [facility]  Service: Vascular;  Laterality: Right;  . LUMBAR LAMINECTOMY/DECOMPRESSION MICRODISCECTOMY N/A 09/06/2014   Procedure: LUMBER DECOMPRESSION L3-5;  Surgeon: DMelina Schools MD;  [facility]  Service: Orthopedics;  Laterality: N/A;  . LUMBAR SPINE SURGERY  2002  . PROSTATE BIOPSY    . PROSTATECTOMY  02/2007  . VASECTOMY      Social History   Socioeconomic History  . Marital status: Married  Spouse name: Not on file  . Number of children: 4  . Years of education: Not on file  . Highest education level: Not on file  Occupational History  . Occupation: Technical brewer    Comment: retired  . Occupation: Designer, industrial/product: [employer]    Comment: full time  Social Needs  . Financial resource strain: Not on file  . Food insecurity    Worry: Not on file    Inability: Not on file  . Transportation needs    Medical: Not on file    Non-medical: Not on file  Tobacco Use  . Smoking status:  Former Smoker    Packs/day: 1.00    Years: 40.00    Pack years: 40.00    Types: Cigarettes    Quit date: 06/28/2008    Years since quitting: 10.5  . Smokeless tobacco: Former Systems developer    Quit date: 06/28/2009  Substance and Sexual Activity  . Alcohol use: Yes    Alcohol/week: 3.0 standard drinks    Types: 2 Glasses of wine, 1 Cans of beer per week    Comment: Occasionally  . Drug use: No  . Sexual activity: Not on file  Lifestyle  . Physical activity    Days per week: Not on file    Minutes per session: Not on file  . Stress: Not on file  Relationships  . Social Herbalist on phone: Not on file    Gets together: Not on file    Attends religious service: Not on file    Active member of club or organization: Not on file    Attends meetings of clubs or organizations: Not on file    Relationship status: Not on file  . Intimate partner violence    Fear of current or ex partner: Not on file    Emotionally abused: Not on file    Physically abused: Not on file    Forced sexual activity: Not on file  Other Topics Concern  . Not on file  Social History Narrative   Married with 3 sons and 1 daughter   Daily caffeine use, 3 per day    Current Outpatient Medications on File Prior to Visit  Medication Sig Dispense Refill  . amLODipine (NORVASC) 10 MG tablet Take 1 tablet (10 mg total) by mouth daily. 30 tablet 11  . aspirin EC 81 MG tablet Take 81 mg by mouth daily.    Marland Kitchen atorvastatin (LIPITOR) 80 MG tablet TAKE 1 TABLET BY MOUTH DAILY AT 6 PM 90 tablet 1  . baclofen (LIORESAL) 20 MG tablet Take 20 mg by mouth 3 (three) times daily.    Marland Kitchen ezetimibe (ZETIA) 10 MG tablet TAKE 1 TABLET BY MOUTH DAILY 90 tablet 1  . losartan-hydrochlorothiazide (HYZAAR) 100-12.5 MG tablet     . metFORMIN (GLUCOPHAGE) 500 MG tablet Take 500 mg by mouth daily with breakfast.   0  . metoprolol succinate (TOPROL-XL) 25 MG 24 hr tablet TAKE 1 TABLET BY MOUTH DAILY 90 tablet 1  . Naproxen Sodium (ALEVE  PO) Take by mouth as needed.     Marland Kitchen oxybutynin (DITROPAN-XL) 5 MG 24 hr tablet TK 1 T PO QHS     No current facility-administered medications on file prior to visit.     Review of Systems  Constitution: Positive for malaise/fatigue. Negative for decreased appetite, weight gain and weight loss.  Eyes: Negative for visual disturbance.  Cardiovascular: Positive for claudication (right  leg worsening) and dyspnea on exertion. Negative for chest pain and near-syncope.  Respiratory: Negative for hemoptysis and wheezing.   Endocrine: Negative for cold intolerance and heat intolerance.  Skin: Negative for nail changes.  Musculoskeletal: Positive for back pain and joint pain. Negative for myalgias.  Gastrointestinal: Negative for abdominal pain, nausea and vomiting.  Neurological: Negative for difficulty with concentration, dizziness, focal weakness and headaches.  Psychiatric/Behavioral: Negative for altered mental status and suicidal ideas.  All other systems reviewed and are negative.     Objective  Blood pressure 124/68, pulse 75, temperature (!) 96.6 F (35.9 C), height '6\' 1"'$  (1.854 m), weight 237 lb (107.5 kg), SpO2 94 %. Body mass index is 31.27 kg/m.    Physical Exam  Constitutional: He appears well-developed and well-nourished.  HENT:  Head: Normocephalic and atraumatic.  Neck: Normal range of motion.  Cardiovascular: Normal rate, regular rhythm and S1 normal.  Pulses:      Carotid pulses are on the right side with bruit and on the left side with bruit.      Femoral pulses are 2+ on the right side and 2+ on the left side.      Popliteal pulses are 1+ on the right side and 1+ on the left side.       Dorsalis pedis pulses are 2+ on the right side and 2+ on the left side.       Posterior tibial pulses are 2+ on the right side and 0 on the left side.  Split S2  Skin: Skin is warm, dry and intact. No cyanosis. No pallor.  Vitals reviewed.  Radiology: No results found.  Laboratory  examination:   01/31/2018: Cholesterol 190, triglycerides 68, HDL 49, LDL 127. HgbA1c 6%. Creatinine 0.9, eGFR 100, Potassium 4.5, CMP normal.   CMP Latest Ref Rng & Units 08/24/2018 07/04/2015 07/03/2015  Glucose 65 - 99 mg/dL 115(H) 110(H) -  BUN 8 - 27 mg/dL 23 11 -  Creatinine 0.76 - 1.27 mg/dL 0.96 0.97 0.84  Sodium 134 - 144 mmol/L 141 140 -  Potassium 3.5 - 5.2 mmol/L 4.7 4.1 -  Chloride 96 - 106 mmol/L 102 105 -  CO2 20 - 29 mmol/L 25 26 -  Calcium 8.6 - 10.2 mg/dL 9.7 8.4(L) -  Total Protein 6.0 - 8.5 g/dL 7.1 - -  Total Bilirubin 0.0 - 1.2 mg/dL 0.7 - -  Alkaline Phos 39 - 117 IU/L 76 - -  AST 0 - 40 IU/L 22 - -  ALT 0 - 44 IU/L 22 - -   CBC Latest Ref Rng & Units 08/24/2018 07/04/2015 07/03/2015  WBC 3.4 - 10.8 x10E3/uL 4.9 5.6 7.1  Hemoglobin 13.0 - 17.7 g/dL 14.6 11.7(L) 11.9(L)  Hematocrit 37.5 - 51.0 % 44.6 36.7(L) 36.9(L)  Platelets 150 - 450 x10E3/uL 206 152 162   Lipid Panel     Component Value Date/Time   CHOL 174 08/24/2018 0809   TRIG 76 08/24/2018 0809   HDL 65 08/24/2018 0809   CHOLHDL 2.7 08/24/2018 0809   CHOLHDL 2.7 06/19/2015 0625   VLDL 12 06/19/2015 0625   LDLCALC 94 08/24/2018 0809   HEMOGLOBIN A1C Lab Results  Component Value Date   HGBA1C 6.2 (H) 06/19/2015   MPG 131 06/19/2015   TSH No results for input(s): TSH in the last 8760 hours.  Cardiac Studies:   Echocardiogram 10/27/2016: Left ventricle cavity is normal in size. Moderate concentric hypertrophy of the left ventricle. Normal global wall motion. Normal diastolic filling  pattern. Calculated EF 55%. Left atrial cavity is moderately dilated. LA is much larger than the measured AP diameter at 4.1 cm. Trileaflet aortic valve with no regurgitation noted. Mild aortic valve leaflet calcification. Mild (Grade I) mitral regurgitation. Mild mitral valve leaflet calcification. Mild tricuspid regurgitation. No evidence of pulmonary hypertension. Compared to the study done on 08/29/2014 no  significant change.  Exercise myoview stress 10/30/2016 1. The resting electrocardiogram demonstrated normal sinus rhythm, RBBB and no resting arrhythmias. The stress electrocardiogram was positive for ischemia with 2 mm ST depression and T inversion in V1 to V3 with peak exercise which resolved at 3 mintues into recovery. Patient exercised on Bruce protocol for 6:15 minutes and achieved 7.05METS. Stress test terminated due to dyspnea and 88 % MPHR achieved (Target HR >85%). 2. The LV is dilated both at rest and stress images. The LV end diastolic volume was 702OV.This is an abnormal myocardial perfusion imaging study demonstrating a mixture of scar plus mild ischemia in the basal inferior, basal inferolateral, mid inferior, mid inferolateral, apical inferior and apical lateral myocardial wall(s). Ischemia more prominent in the lateral wall. Gated SPECT imaging demonstrates hypokinesis of the basal inferior, basal inferolateral, mid inferior and mid inferolateral myocardial wall(s). The left ventricular ejection fraction was calculated to be 40%. Compared to the prior study from 08/31/2014, the current study reveals no significant change. Previously Lexiscan stress performed. This is an intermediate risk study, clinical correlation recommended.  Lower extremity arterial duplex 01/19/2018: Diffuse calcific mild plaque noted throughout the bilateral lower extremity. There is >50% stenosis in the right distal anterior tibial artery. Diffuxe moderately abnormal biphasic waveform in both the lower extremity suggests diffuse disease. This exam reveals normal perfusion of both the lower extremity (ABI 1.00).  Renal artery duplex 10/20/2017: No evidence of renal artery occlusive disease in either renal artery. Normal size both kidneys with normal intrarenal vascular perfusion is noted in the right kidney. Mild diffuse plaque noted in the abdominal aorta.   Assessment   1. Atherosclerosis of  native artery of both lower extremities with intermittent claudication [facility]) Patient has intermittent claudication of bilateral lower extremities. He has diffuse disease by lower extremity duplex. Denies any ulcerations or skin color changes. Vascular exam is unchanged. Claudication symptoms stable. I suspect that his claudication symptoms are multifactoral and most likely related to neurogenic claudication from his spinal issues.I have recommended that we continue with aggressive medical management. If symptoms worsen or evidence of ischemic limb, will perform PV angiogram at that time.   2. Resistant hypertension Blood pressure is very well controlled today. Continue with current therapy.  3. Spinal stenosis of lumbar region with neurogenic claudication Suspect that this is the major etiology for his claudication. Encouraged him to continue to follow up with Dr. Rolena Infante.  4. Atherosclerosis of native coronary artery of native heart without angina pectoris No symptoms of angina. On appropriate medical therapy.   5. Hyperlipidemia Uncontrolled on Zetia and Lipitor. Could consider Nexlizet. Will see if we can obtain coverage for this. He has been compliant with medications.  6. Carotid stenosis, right Due for follow up with Vascular surgery, but has been unable to schedule appt. Will obtain bilateral carotid duplex in our office.  7. Adenocarcinoma, prostate Has completed radiation therapy. Currently undergoing Lupron therapy. Followed by Dr. Gloriann Loan and Dr. Tammi Klippel.  EKG 12/28/2018: Normal sinus rhythm at 77 bpm with first-degree AV block, left axis deviation, left anterior fascicular block.  Cannot exclude inferior infarct old.  Right bundle branch  block.  Bifascicular block no evidence of ischemia.  No changes compared to 01/31/2018.  Recommendations:   I will notify him of carotid results and also on coverage for Nexlizet. I will see him back in 4 months or sooner if needed.  Miquel Dunn, MSN, APRN, FNP-C [facility]. [place] Office: [phone] Fax: [phone]

## 2018-12-29 MED ORDER — NEXLIZET 180-10 MG PO TABS: 1.0000 | ORAL_TABLET | Freq: Every day | ORAL | 1 refills | Status: DC

## 2018-12-29 NOTE — Addendum Note (Signed)
Addended by: Gwinda Maine on: 12/29/2018 04:05 PM   Modules accepted: Orders

## 2019-01-06 DIAGNOSIS — E119 Type 2 diabetes mellitus without complications: Secondary | ICD-10-CM | POA: Diagnosis not present

## 2019-01-06 DIAGNOSIS — Z0000 Encounter for general adult medical examination without abnormal findings: Secondary | ICD-10-CM | POA: Diagnosis not present

## 2019-01-06 DIAGNOSIS — G4733 Obstructive sleep apnea (adult) (pediatric): Secondary | ICD-10-CM | POA: Diagnosis not present

## 2019-01-06 DIAGNOSIS — I2510 Atherosclerotic heart disease of native coronary artery without angina pectoris: Secondary | ICD-10-CM | POA: Diagnosis not present

## 2019-01-06 DIAGNOSIS — I10 Essential (primary) hypertension: Secondary | ICD-10-CM | POA: Diagnosis not present

## 2019-01-06 DIAGNOSIS — Z23 Encounter for immunization: Secondary | ICD-10-CM | POA: Diagnosis not present

## 2019-01-19 DIAGNOSIS — R351 Nocturia: Secondary | ICD-10-CM | POA: Diagnosis not present

## 2019-01-19 DIAGNOSIS — C61 Malignant neoplasm of prostate: Secondary | ICD-10-CM | POA: Diagnosis not present

## 2019-01-26 DIAGNOSIS — E1169 Type 2 diabetes mellitus with other specified complication: Secondary | ICD-10-CM | POA: Diagnosis not present

## 2019-01-26 DIAGNOSIS — H903 Sensorineural hearing loss, bilateral: Secondary | ICD-10-CM | POA: Diagnosis not present

## 2019-01-26 DIAGNOSIS — H9313 Tinnitus, bilateral: Secondary | ICD-10-CM | POA: Diagnosis not present

## 2019-01-26 DIAGNOSIS — H81399 Other peripheral vertigo, unspecified ear: Secondary | ICD-10-CM | POA: Diagnosis not present

## 2019-04-05 ENCOUNTER — Ambulatory Visit (INDEPENDENT_AMBULATORY_CARE_PROVIDER_SITE_OTHER): Payer: Medicare Other

## 2019-04-05 ENCOUNTER — Other Ambulatory Visit: Payer: Self-pay

## 2019-04-05 DIAGNOSIS — I6521 Occlusion and stenosis of right carotid artery: Secondary | ICD-10-CM

## 2019-04-09 ENCOUNTER — Other Ambulatory Visit: Payer: Self-pay | Admitting: Cardiology

## 2019-04-09 DIAGNOSIS — I6523 Occlusion and stenosis of bilateral carotid arteries: Secondary | ICD-10-CM

## 2019-04-12 ENCOUNTER — Other Ambulatory Visit: Payer: Self-pay | Admitting: Cardiology

## 2019-04-21 DIAGNOSIS — R519 Headache, unspecified: Secondary | ICD-10-CM

## 2019-04-21 HISTORY — DX: Headache, unspecified: R51.9

## 2019-04-27 ENCOUNTER — Other Ambulatory Visit: Payer: Self-pay

## 2019-04-27 ENCOUNTER — Ambulatory Visit (INDEPENDENT_AMBULATORY_CARE_PROVIDER_SITE_OTHER): Payer: Medicare Other | Admitting: Cardiology

## 2019-04-27 ENCOUNTER — Encounter: Payer: Self-pay | Admitting: Cardiology

## 2019-04-27 VITALS — BP 137/77 | HR 74 | Temp 98.4°F | Ht 73.0 in | Wt 238.1 lb

## 2019-04-27 DIAGNOSIS — I10 Essential (primary) hypertension: Secondary | ICD-10-CM

## 2019-04-27 DIAGNOSIS — M48062 Spinal stenosis, lumbar region with neurogenic claudication: Secondary | ICD-10-CM

## 2019-04-27 DIAGNOSIS — I70213 Atherosclerosis of native arteries of extremities with intermittent claudication, bilateral legs: Secondary | ICD-10-CM

## 2019-04-27 DIAGNOSIS — I2510 Atherosclerotic heart disease of native coronary artery without angina pectoris: Secondary | ICD-10-CM

## 2019-04-27 DIAGNOSIS — I6523 Occlusion and stenosis of bilateral carotid arteries: Secondary | ICD-10-CM | POA: Diagnosis not present

## 2019-04-27 DIAGNOSIS — E785 Hyperlipidemia, unspecified: Secondary | ICD-10-CM

## 2019-04-27 NOTE — Progress Notes (Signed)
Primary Physician/Referring:  Maurice Small, MD  Patient ID: [name], male    DOB: 1945-10-31, 74 y.o.   MRN: [id number]  Chief Complaint  Patient presents with  . Coronary Artery Disease  . PAD  . Hyperlipidemia  . Carotid Stenosis  . Follow-up    52mo    HPI: [name] is a 74y.o. 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 BTrula Sladein March 2017. History of recurrence prostate adenocarcinoma s/p RALP in 2008 and again recently in March 2020.   Patient has resistant hypertension and renal artery duplex in July 2019 was negative for renal artery stenosis. He has a history of noncompliance with medication, and does admit to occasionally missing a dose of his medications.   He does have sleep apnea by sleep study in 2018; however, states that he had difficulty with the mask and does not use this.  He is a 329-monthffice visit.  He has symptoms of claudication that are felt to be multifactorial, likely related to spinal stenosis.  He does have diabetes PAD by lower extremity arterial duplex.  At his last office visit I had started him on Nexlizet for continued uncontrolled hyperlipidemia. He presents to discuss recent carotid duplex results.   Denies any exertional chest pain or dyspnea on exertion.  He does mention some worsening claudication symptoms since last seen by me, particularly in the right leg. Notices fatigue mostly in his legs. He previously was walking 2 miles, but now only able to tolerate 1 mile.   Unfortunately, he has been found to have recurrent biochemical prostate adenocarcinoma in March 2020. Underwent salvage radiation treatment in combination with ADT.   Past Medical History:  Diagnosis Date  . Anxiety   . ASCVD  (arteriosclerotic cardiovascular disease)    03/2004: DES x2 to the RCA; IMI with stent occlusion in 02/2005- suboptimal Plavix compliance  . CAD (coronary artery disease)   . Carcinoma of prostate ([facility]   Clopidogrel held for biopsy  . Constipation due to pain medication   . Diabetes mellitus without complication ([facility]  . Diabetic neuropathy ([facility]  . Hyperlipidemia   . Hypertension   . Mild depression ([facility]  . Morbid obesity ([facility]  . Myocardial infarction ([facility] [place]  . Shortness of breath dyspnea   . Stroke ([facility]  . Tobacco abuse    stopped smoking 06/28/09    Past Surgical History:  Procedure Laterality Date  . BACK SURGERY    . CARDIAC CATHETERIZATION     X 1 stent before having CABG  . CORONARY ARTERY BYPASS GRAFT  06/28/2008   X4  . ENDARTERECTOMY Right 07/03/2015   Procedure: RIGHT CAROTID ENDARTERECTOMY WITH BOVINE PERICARDIUM PATCH ANGIOPLASTY;  Surgeon: VaSerafina MitchellMD;  Location: [facility] Service: Vascular;  Laterality: Right;  . LUMBAR LAMINECTOMY/DECOMPRESSION MICRODISCECTOMY N/A 09/06/2014   Procedure: LUMBER DECOMPRESSION L3-5;  Surgeon: DaMelina SchoolsMD;  [place]: [facility] Service: [facility]opedics;  Laterality: N/A;  . LUMBAR SPINE SURGERY  2002  . PROSTATE BIOPSY    . PROSTATECTOMY  02/2007  . VASECTOMY      Social History   Socioeconomic History  . Marital status: Married    Spouse name: Not on file  . Number of  children: 4  . Years of education: Not on file  . Highest education level: Not on file  Occupational History  . Occupation: Technical brewer    Comment: retired  . Occupation: Designer, industrial/product: [employer]    Comment: full time  Tobacco Use  . Smoking status: Former Smoker    Packs/day: 1.00    Years: 40.00    Pack years: 40.00    Types: Cigarettes    Quit date: 06/28/2008    Years since quitting: 10.8  . Smokeless tobacco: Former Systems developer    Quit date: 06/28/2009  Substance and Sexual Activity  . Alcohol use: Yes     Alcohol/week: 3.0 standard drinks    Types: 2 Glasses of wine, 1 Cans of beer per week    Comment: Occasionally  . Drug use: No  . Sexual activity: Not on file  Other Topics Concern  . Not on file  Social History Narrative   Married with 3 sons and 1 daughter   Daily caffeine use, 3 per day   Social Determinants of Health   Financial Resource Strain:   . Difficulty of Paying Living Expenses: Not on file  Food Insecurity:   . Worried About Charity fundraiser in the Last Year: Not on file  . Ran Out of Food in the Last Year: Not on file  Transportation Needs:   . Lack of Transportation (Medical): Not on file  . Lack of Transportation (Non-Medical): Not on file  Physical Activity:   . Days of Exercise per Week: Not on file  . Minutes of Exercise per Session: Not on file  Stress:   . Feeling of Stress : Not on file  Social Connections:   . Frequency of Communication with Friends and Family: Not on file  . Frequency of Social Gatherings with Friends and Family: Not on file  . Attends Religious Services: Not on file  . Active Member of Clubs or Organizations: Not on file  . Attends Archivist Meetings: Not on file  . Marital Status: Not on file  Intimate Partner Violence:   . Fear of Current or Ex-Partner: Not on file  . Emotionally Abused: Not on file  . Physically Abused: Not on file  . Sexually Abused: Not on file    Current Outpatient Medications on File Prior to Visit  Medication Sig Dispense Refill  . amLODipine (NORVASC) 10 MG tablet TAKE 1 TABLET BY MOUTH DAILY 90 tablet 0  . aspirin EC 81 MG tablet Take 81 mg by mouth daily.    Marland Kitchen atorvastatin (LIPITOR) 80 MG tablet TAKE 1 TABLET BY MOUTH DAILY AT 6 PM 90 tablet 1  . baclofen (LIORESAL) 20 MG tablet Take 20 mg by mouth 2 (two) times daily.     . Bempedoic Acid-Ezetimibe (NEXLIZET) 180-10 MG TABS Take 1 tablet by mouth daily. 30 tablet 1  . losartan-hydrochlorothiazide (HYZAAR) 100-12.5 MG tablet daily.       . metoprolol succinate (TOPROL-XL) 25 MG 24 hr tablet TAKE 1 TABLET BY MOUTH DAILY 90 tablet 1  . Naproxen Sodium (ALEVE PO) Take by mouth as needed.     Marland Kitchen oxybutynin (DITROPAN-XL) 5 MG 24 hr tablet TK 1 T PO QHS     No current facility-administered medications on file prior to visit.    Review of Systems  Constitution: Positive for malaise/fatigue. Negative for decreased appetite, weight gain and weight loss.  Eyes: Negative for visual disturbance.  Cardiovascular: Positive  for claudication (right leg worsening) and dyspnea on exertion. Negative for chest pain and near-syncope.  Respiratory: Negative for hemoptysis and wheezing.   Endocrine: Negative for cold intolerance and heat intolerance.  Skin: Negative for nail changes.  Musculoskeletal: Positive for back pain and joint pain. Negative for myalgias.  Gastrointestinal: Negative for abdominal pain, nausea and vomiting.  Neurological: Negative for difficulty with concentration, dizziness, focal weakness and headaches.  Psychiatric/Behavioral: Negative for altered mental status and suicidal ideas.  All other systems reviewed and are negative.     Objective  Blood pressure 137/77, pulse 74, temperature 98.4 F (36.9 C), height '6\' 1"'$  (1.854 m), weight 238 lb 1.6 oz (108 kg), SpO2 96 %. Body mass index is 31.41 kg/m.    Physical Exam  Constitutional: He appears well-developed and well-nourished.  HENT:  Head: Normocephalic and atraumatic.  Cardiovascular: Normal rate, regular rhythm and S1 normal.  Pulses:      Carotid pulses are on the right side with bruit and on the left side with bruit.      Femoral pulses are 2+ on the right side and 2+ on the left side.      Popliteal pulses are 1+ on the right side and 1+ on the left side.       Dorsalis pedis pulses are 1+ on the right side and 2+ on the left side.       Posterior tibial pulses are 0 on the right side and 0 on the left side.  Split S2  Musculoskeletal:     Cervical  back: Normal range of motion.  Skin: Skin is warm, dry and intact. No cyanosis. No pallor.  Vitals reviewed.  Radiology: No results found.  Laboratory examination:   01/31/2018: Cholesterol 190, triglycerides 68, HDL 49, LDL 127. HgbA1c 6%. Creatinine 0.9, eGFR 100, Potassium 4.5, CMP normal.   CMP Latest Ref Rng & Units 08/24/2018 07/04/2015 07/03/2015  Glucose 65 - 99 mg/dL 115(H) 110(H) -  BUN 8 - 27 mg/dL 23 11 -  Creatinine 0.76 - 1.27 mg/dL 0.96 0.97 0.84  Sodium 134 - 144 mmol/L 141 140 -  Potassium 3.5 - 5.2 mmol/L 4.7 4.1 -  Chloride 96 - 106 mmol/L 102 105 -  CO2 20 - 29 mmol/L 25 26 -  Calcium 8.6 - 10.2 mg/dL 9.7 8.4(L) -  Total Protein 6.0 - 8.5 g/dL 7.1 - -  Total Bilirubin 0.0 - 1.2 mg/dL 0.7 - -  Alkaline Phos 39 - 117 IU/L 76 - -  AST 0 - 40 IU/L 22 - -  ALT 0 - 44 IU/L 22 - -   CBC Latest Ref Rng & Units 08/24/2018 07/04/2015 07/03/2015  WBC 3.4 - 10.8 x10E3/uL 4.9 5.6 7.1  Hemoglobin 13.0 - 17.7 g/dL 14.6 11.7(L) 11.9(L)  Hematocrit 37.5 - 51.0 % 44.6 36.7(L) 36.9(L)  Platelets 150 - 450 x10E3/uL 206 152 162   Lipid Panel     Component Value Date/Time   CHOL 174 08/24/2018 0809   TRIG 76 08/24/2018 0809   HDL 65 08/24/2018 0809   CHOLHDL 2.7 08/24/2018 0809   CHOLHDL 2.7 06/19/2015 0625   VLDL 12 06/19/2015 0625   LDLCALC 94 08/24/2018 0809   HEMOGLOBIN A1C Lab Results  Component Value Date   HGBA1C 6.2 (H) 06/19/2015   MPG 131 06/19/2015   TSH No results for input(s): TSH in the last 8760 hours.  Cardiac Studies:   Echocardiogram 10/27/2016: Left ventricle cavity is normal in size. Moderate concentric hypertrophy of  the left ventricle. Normal global wall motion. Normal diastolic filling pattern. Calculated EF 55%. Left atrial cavity is moderately dilated. LA is much larger than the measured AP diameter at 4.1 cm. Trileaflet aortic valve with no regurgitation noted. Mild aortic valve leaflet calcification. Mild (Grade I) mitral regurgitation. Mild  mitral valve leaflet calcification. Mild tricuspid regurgitation. No evidence of pulmonary hypertension. Compared to the study done on 08/29/2014 no significant change.  Exercise myoview stress 10/30/2016 1. The resting electrocardiogram demonstrated normal sinus rhythm, RBBB and no resting arrhythmias. The stress electrocardiogram was positive for ischemia with 2 mm ST depression and T inversion in V1 to V3 with peak exercise which resolved at 3 mintues into recovery. Patient exercised on Bruce protocol for 6:15 minutes and achieved 7.05METS. Stress test terminated due to dyspnea and 88 % MPHR achieved (Target HR >85%). 2. The LV is dilated both at rest and stress images. The LV end diastolic volume was 704UG.This is an abnormal myocardial perfusion imaging study demonstrating a mixture of scar plus mild ischemia in the basal inferior, basal inferolateral, mid inferior, mid inferolateral, apical inferior and apical lateral myocardial wall(s). Ischemia more prominent in the lateral wall. Gated SPECT imaging demonstrates hypokinesis of the basal inferior, basal inferolateral, mid inferior and mid inferolateral myocardial wall(s). The left ventricular ejection fraction was calculated to be 40%. Compared to the prior study from 08/31/2014, the current study reveals no significant change. Previously Lexiscan stress performed. This is an intermediate risk study, clinical correlation recommended.  Lower extremity arterial duplex 01/19/2018: Diffuse calcific mild plaque noted throughout the bilateral lower extremity. There is >50% stenosis in the right distal anterior tibial artery. Diffuxe moderately abnormal biphasic waveform in both the lower extremity suggests diffuse disease. This exam reveals normal perfusion of both the lower extremity (ABI 1.00).  Renal artery duplex 10/20/2017: No evidence of renal artery occlusive disease in either renal artery. Normal size both kidneys with normal  intrarenal vascular perfusion is noted in the right kidney. Mild diffuse plaque noted in the abdominal aorta.  Carotid artery duplex  04/05/2019: Stenosis in the right carotid artery bulb of (<50%). Stenosis in the left internal carotid artery (50-69%). Stenosis in the left external carotid artery (>50%). Antegrade right vertebral artery flow. Antegrade left vertebral artery flow. No significant change since 04/23/2015. Follow up in six months is appropriate if clinically indicated.   Assessment   1. Atherosclerosis of native artery of both lower extremities with intermittent claudication [facility]) Patient has had worsening symptoms of claudication since last seen by me particularly in his right leg.  Vascular exam is essentially unchanged.  No evidence of ischemic limb or ulcerations.  We will repeat lower extremity arterial duplex for further evaluation.  May need to consider PV angiogram for further work-up given his continued symptoms of claudication and worsening symptoms.  He is high risk  given his previous history of carotid disease and CAD.  2. Primary hypertension Blood pressure has been well controlled.  He continues to be compliant with his medications.  Continue with present medications.  3. Spinal stenosis of lumbar region with neurogenic claudication I do suspect that spinal stenosis is contributing to some of his symptoms of claudication; however, given his worsening symptoms as stated above we will further evaluate his PAD.  4. Atherosclerosis of native coronary artery of native heart without angina pectoris Denies any symptoms of angina.  On appropriate medical therapy.  5. Hyperlipidemia By his last lipid panel, his lipids were continued to be uncontrolled  despite Zetia and Lipitor.  He is now on next visit and tolerating this well.  Incidentally found to also be on Zetia which I have advised him to discontinue.  I will reevaluate his lipids for follow-up.  He will need  aggressive lipid control in view of his vascular disease history.  6. Bilateral carotid stenosis Asymptomatic.  Has history of right carotid endarterectomy in 2017.  I discussed recent carotid duplex.  He will need continued follow-up in 6 months for surveillance.   EKG 12/28/2018: Normal sinus rhythm at 77 bpm with first-degree AV block, left axis deviation, left anterior fascicular block.  Cannot exclude inferior infarct old.  Right bundle branch block.  Bifascicular block no evidence of ischemia.  No changes compared to 01/31/2018.  Recommendations:   I will plan to see him back after his lower extremity arterial duplex to discuss results and for further recommendations.  Miquel Dunn, MSN, APRN, FNP-C [facility] Cardi[facility]. [place] Office: [phone] Fax: [phone]

## 2019-05-04 DIAGNOSIS — E785 Hyperlipidemia, unspecified: Secondary | ICD-10-CM | POA: Diagnosis not present

## 2019-05-04 DIAGNOSIS — I70213 Atherosclerosis of native arteries of extremities with intermittent claudication, bilateral legs: Secondary | ICD-10-CM | POA: Diagnosis not present

## 2019-05-05 LAB — COMPREHENSIVE METABOLIC PANEL
ALT: 16 IU/L (ref 0–44)
AST: 22 IU/L (ref 0–40)
Albumin/Globulin Ratio: 2 (ref 1.2–2.2)
Albumin: 4.5 g/dL (ref 3.7–4.7)
Alkaline Phosphatase: 80 IU/L (ref 39–117)
BUN/Creatinine Ratio: 14 (ref 10–24)
BUN: 11 mg/dL (ref 8–27)
Bilirubin Total: 0.7 mg/dL (ref 0.0–1.2)
CO2: 25 mmol/L (ref 20–29)
Calcium: 9.3 mg/dL (ref 8.6–10.2)
Chloride: 103 mmol/L (ref 96–106)
Creatinine, Ser: 0.8 mg/dL (ref 0.76–1.27)
GFR calc Af Amer: 102 mL/min/{1.73_m2} (ref >59)
GFR calc non Af Amer: 89 mL/min/{1.73_m2} (ref >59)
Globulin, Total: 2.3 g/dL (ref 1.5–4.5)
Glucose: 117 mg/dL — ABNORMAL HIGH (ref 65–99)
Potassium: 4.4 mmol/L (ref 3.5–5.2)
Sodium: 139 mmol/L (ref 134–144)
Total Protein: 6.8 g/dL (ref 6.0–8.5)

## 2019-05-05 LAB — LIPID PANEL
Chol/HDL Ratio: 2.2 ratio (ref 0.0–5.0)
Cholesterol, Total: 146 mg/dL (ref 100–199)
HDL: 66 mg/dL (ref >39)
LDL Chol Calc (NIH): 67 mg/dL (ref 0–99)
Triglycerides: 66 mg/dL (ref 0–149)
VLDL Cholesterol Cal: 13 mg/dL (ref 5–40)

## 2019-05-08 ENCOUNTER — Other Ambulatory Visit: Payer: Medicare Other

## 2019-05-15 ENCOUNTER — Ambulatory Visit (INDEPENDENT_AMBULATORY_CARE_PROVIDER_SITE_OTHER): Payer: Medicare Other

## 2019-05-15 ENCOUNTER — Other Ambulatory Visit: Payer: Self-pay

## 2019-05-15 DIAGNOSIS — I70213 Atherosclerosis of native arteries of extremities with intermittent claudication, bilateral legs: Secondary | ICD-10-CM

## 2019-05-23 DIAGNOSIS — K627 Radiation proctitis: Secondary | ICD-10-CM | POA: Diagnosis not present

## 2019-05-25 ENCOUNTER — Ambulatory Visit (INDEPENDENT_AMBULATORY_CARE_PROVIDER_SITE_OTHER): Payer: Medicare Other | Admitting: Cardiology

## 2019-05-25 ENCOUNTER — Encounter: Payer: Self-pay | Admitting: Cardiology

## 2019-05-25 ENCOUNTER — Other Ambulatory Visit: Payer: Self-pay

## 2019-05-25 VITALS — BP 137/73 | HR 78 | Temp 97.5°F | Ht 73.0 in | Wt 236.4 lb

## 2019-05-25 DIAGNOSIS — I70213 Atherosclerosis of native arteries of extremities with intermittent claudication, bilateral legs: Secondary | ICD-10-CM

## 2019-05-25 DIAGNOSIS — E785 Hyperlipidemia, unspecified: Secondary | ICD-10-CM

## 2019-05-25 DIAGNOSIS — M48062 Spinal stenosis, lumbar region with neurogenic claudication: Secondary | ICD-10-CM | POA: Diagnosis not present

## 2019-05-25 DIAGNOSIS — I10 Essential (primary) hypertension: Secondary | ICD-10-CM

## 2019-05-25 MED ORDER — CILOSTAZOL 50 MG PO TABS: 50.0000 mg | ORAL_TABLET | Freq: Two times a day (BID) | ORAL | 1 refills | Status: DC

## 2019-05-25 NOTE — Progress Notes (Signed)
Primary Physician/Referring:  Maurice Small, MD  Patient ID: KEEGHAN BIALY, male    DOB: 04/26/1945, 74 y.o.   MRN: [id number]  Chief Complaint  Patient presents with  . atherosclerosis  . Hypertension  . Follow-up    labs, testing, claudication     HPI: [name]  is a 74 y.o. 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 Trula Slade in March 2017. History of recurrence prostate adenocarcinoma s/p RALP in 2008 and again recently in March 2020.    He does have sleep apnea by sleep study in 2018; however, states that he had difficulty with the mask and does not use this.  Recently seen for follow up and mentioned worsening leg pain. He underwent repeat lower extremity duplex and now presents for follow up.   Continues to report claudication symptoms in bilateral legs with walking, worse in right leg. He does also have back pain. Now walks with a cane if going to have to walk long distances.Denies any exertional chest pain or dyspnea on exertion.     Unfortunately, he has been found to have recurrent biochemical prostate adenocarcinoma in March 2020. Underwent salvage radiation treatment in combination with ADT.   Past Medical History:  Diagnosis Date  . Anxiety   . ASCVD (arteriosclerotic cardiovascular disease)    03/2004: DES x2 to the RCA; IMI with stent occlusion in 02/2005- suboptimal Plavix compliance  . CAD (coronary artery disease)   . Carcinoma of prostate ([place])    Clopidogrel held for biopsy  . Constipation due to pain medication   . Diabetes mellitus without complication ([place])   . Diabetic neuropathy ([place])   . Hyperlipidemia   . Hypertension   . Mild depression ([place])   . Morbid obesity ([place])   . Myocardial infarction ([place])   .  Shortness of breath dyspnea   . Stroke ([place])   . Tobacco abuse    stopped smoking 06/28/09    Past Surgical History:  Procedure Laterality Date  . BACK SURGERY    . CARDIAC CATHETERIZATION     X 1 stent before having CABG  . CORONARY ARTERY BYPASS GRAFT  06/28/2008   X4  . ENDARTERECTOMY Right 07/03/2015   Procedure: RIGHT CAROTID ENDARTERECTOMY WITH BOVINE PERICARDIUM PATCH ANGIOPLASTY;  Surgeon: Serafina Mitchell, MD;  Location: [facility];  Service: Vascular;  Laterality: Right;  . LUMBAR LAMINECTOMY/DECOMPRESSION MICRODISCECTOMY N/A 09/06/2014   Procedure: LUMBER DECOMPRESSION L3-5;  Surgeon: Melina Schools, MD;  Location: [facility];  Service: Orthopedics;  Laterality: N/A;  . LUMBAR SPINE SURGERY  2002  . PROSTATE BIOPSY    . PROSTATECTOMY  02/2007  . VASECTOMY      Social History   Socioeconomic History  . Marital status: Married    Spouse name: Not on file  . Number of children: 4  . Years of education: Not on file  . Highest education level: Not on file  Occupational History  . Occupation: Technical brewer    Comment: retired  . Occupation: Designer, industrial/product: [employer]    Comment: full time  Tobacco Use  . Smoking status: Former Smoker    Packs/day: 1.00    Years: 40.00    Pack years: 40.00    Types: Cigarettes    Quit date:  06/28/2008    Years since quitting: 10.9  . Smokeless tobacco: Former Systems developer    Quit date: 06/28/2009  Substance and Sexual Activity  . Alcohol use: Not Currently    Alcohol/week: 3.0 standard drinks    Types: 2 Glasses of wine, 1 Cans of beer per week  . Drug use: No  . Sexual activity: Not on file  Other Topics Concern  . Not on file  Social History Narrative   Married with 3 sons and 1 daughter   Daily caffeine use, 3 per day   Social Determinants of Health   Financial Resource Strain:   . Difficulty of Paying Living Expenses: Not on file  Food Insecurity:   . Worried About Charity fundraiser in the Last Year: Not on  file  . Ran Out of Food in the Last Year: Not on file  Transportation Needs:   . Lack of Transportation (Medical): Not on file  . Lack of Transportation (Non-Medical): Not on file  Physical Activity:   . Days of Exercise per Week: Not on file  . Minutes of Exercise per Session: Not on file  Stress:   . Feeling of Stress : Not on file  Social Connections:   . Frequency of Communication with Friends and Family: Not on file  . Frequency of Social Gatherings with Friends and Family: Not on file  . Attends Religious Services: Not on file  . Active Member of Clubs or Organizations: Not on file  . Attends Archivist Meetings: Not on file  . Marital Status: Not on file  Intimate Partner Violence:   . Fear of Current or Ex-Partner: Not on file  . Emotionally Abused: Not on file  . Physically Abused: Not on file  . Sexually Abused: Not on file    Current Outpatient Medications on File Prior to Visit  Medication Sig Dispense Refill  . amLODipine (NORVASC) 10 MG tablet TAKE 1 TABLET BY MOUTH DAILY 90 tablet 0  . aspirin EC 81 MG tablet Take 81 mg by mouth daily.    Marland Kitchen atorvastatin (LIPITOR) 80 MG tablet TAKE 1 TABLET BY MOUTH DAILY AT 6 PM 90 tablet 1  . baclofen (LIORESAL) 20 MG tablet Take 20 mg by mouth 2 (two) times daily.     . Bempedoic Acid-Ezetimibe (NEXLIZET) 180-10 MG TABS Take 1 tablet by mouth daily. 30 tablet 1  . losartan-hydrochlorothiazide (HYZAAR) 100-12.5 MG tablet Take 1 tablet by mouth daily.     . metoprolol succinate (TOPROL-XL) 25 MG 24 hr tablet TAKE 1 TABLET BY MOUTH DAILY 90 tablet 1  . Naproxen Sodium (ALEVE PO) Take by mouth as needed.      No current facility-administered medications on file prior to visit.    Review of Systems  Constitution: Positive for malaise/fatigue. Negative for decreased appetite, weight gain and weight loss.  Eyes: Negative for visual disturbance.  Cardiovascular: Positive for claudication (right leg worsening) and dyspnea on  exertion. Negative for chest pain and near-syncope.  Respiratory: Negative for hemoptysis and wheezing.   Endocrine: Negative for cold intolerance and heat intolerance.  Skin: Negative for nail changes.  Musculoskeletal: Positive for back pain and joint pain. Negative for myalgias.  Gastrointestinal: Negative for abdominal pain, nausea and vomiting.  Neurological: Negative for difficulty with concentration, dizziness, focal weakness and headaches.  Psychiatric/Behavioral: Negative for altered mental status and suicidal ideas.  All other systems reviewed and are negative.     Objective  Blood pressure 137/73, pulse 78,  temperature (!) 97.5 F (36.4 C), height '6\' 1"'$  (1.854 m), weight 236 lb 6.4 oz (107.2 kg), SpO2 94 %. Body mass index is 31.19 kg/m.    Physical Exam  Constitutional: He appears well-developed and well-nourished.  HENT:  Head: Normocephalic and atraumatic.  Cardiovascular: Normal rate, regular rhythm and S1 normal.  Pulses:      Carotid pulses are on the right side with bruit and on the left side with bruit.      Femoral pulses are 2+ on the right side and 2+ on the left side.      Popliteal pulses are 1+ on the right side and 1+ on the left side.       Dorsalis pedis pulses are 1+ on the right side and 2+ on the left side.       Posterior tibial pulses are 0 on the right side and 0 on the left side.  Split S2  Musculoskeletal:     Cervical back: Normal range of motion.  Skin: Skin is warm, dry and intact. No cyanosis. No pallor.  Vitals reviewed.  Radiology: No results found.  Laboratory examination:   01/31/2018: Cholesterol 190, triglycerides 68, HDL 49, LDL 127. HgbA1c 6%. Creatinine 0.9, eGFR 100, Potassium 4.5, CMP normal.   CMP Latest Ref Rng & Units 05/04/2019 08/24/2018 07/04/2015  Glucose 65 - 99 mg/dL 117(H) 115(H) 110(H)  BUN 8 - 27 mg/dL '11 23 11  '$ Creatinine 0.76 - 1.27 mg/dL 0.80 0.96 0.97  Sodium 134 - 144 mmol/L 139 141 140  Potassium 3.5 - 5.2  mmol/L 4.4 4.7 4.1  Chloride 96 - 106 mmol/L 103 102 105  CO2 20 - 29 mmol/L '25 25 26  '$ Calcium 8.6 - 10.2 mg/dL 9.3 9.7 8.4(L)  Total Protein 6.0 - 8.5 g/dL 6.8 7.1 -  Total Bilirubin 0.0 - 1.2 mg/dL 0.7 0.7 -  Alkaline Phos 39 - 117 IU/L 80 76 -  AST 0 - 40 IU/L 22 22 -  ALT 0 - 44 IU/L 16 22 -   CBC Latest Ref Rng & Units 08/24/2018 07/04/2015 07/03/2015  WBC 3.4 - 10.8 x10E3/uL 4.9 5.6 7.1  Hemoglobin 13.0 - 17.7 g/dL 14.6 11.7(L) 11.9(L)  Hematocrit 37.5 - 51.0 % 44.6 36.7(L) 36.9(L)  Platelets 150 - 450 x10E3/uL 206 152 162   Lipid Panel     Component Value Date/Time   CHOL 146 05/04/2019 0954   TRIG 66 05/04/2019 0954   HDL 66 05/04/2019 0954   CHOLHDL 2.2 05/04/2019 0954   CHOLHDL 2.7 06/19/2015 0625   VLDL 12 06/19/2015 0625   LDLCALC 67 05/04/2019 0954   HEMOGLOBIN A1C Lab Results  Component Value Date   HGBA1C 6.2 (H) 06/19/2015   MPG 131 06/19/2015   TSH No results for input(s): TSH in the last 8760 hours.  Cardiac Studies:   Echocardiogram 10/27/2016: Left ventricle cavity is normal in size. Moderate concentric hypertrophy of the left ventricle. Normal global wall motion. Normal diastolic filling pattern. Calculated EF 55%. Left atrial cavity is moderately dilated. LA is much larger than the measured AP diameter at 4.1 cm. Trileaflet aortic valve with no regurgitation noted. Mild aortic valve leaflet calcification. Mild (Grade I) mitral regurgitation. Mild mitral valve leaflet calcification. Mild tricuspid regurgitation. No evidence of pulmonary hypertension. Compared to the study done on 08/29/2014 no significant change.  Exercise myoview stress 10/30/2016 1. The resting electrocardiogram demonstrated normal sinus rhythm, RBBB and no resting arrhythmias. The stress electrocardiogram was positive for ischemia with 2  mm ST depression and T inversion in V1 to V3 with peak exercise which resolved at 3 mintues into recovery. Patient exercised on Bruce protocol  for 6:15 minutes and achieved 7.05METS. Stress test terminated due to dyspnea and 88 % MPHR achieved (Target HR >85%). 2. The LV is dilated both at rest and stress images. The LV end diastolic volume was 920FE.This is an abnormal myocardial perfusion imaging study demonstrating a mixture of scar plus mild ischemia in the basal inferior, basal inferolateral, mid inferior, mid inferolateral, apical inferior and apical lateral myocardial wall(s). Ischemia more prominent in the lateral wall. Gated SPECT imaging demonstrates hypokinesis of the basal inferior, basal inferolateral, mid inferior and mid inferolateral myocardial wall(s). The left ventricular ejection fraction was calculated to be 40%. Compared to the prior study from 08/31/2014, the current study reveals no significant change. Previously Lexiscan stress performed. This is an intermediate risk study, clinical correlation recommended.  Lower extremity arterial duplex 01/19/2018: Diffuse calcific mild plaque noted throughout the bilateral lower extremity. There is >50% stenosis in the right distal anterior tibial artery. Diffuxe moderately abnormal biphasic waveform in both the lower extremity suggests diffuse disease. This exam reveals normal perfusion of both the lower extremity (ABI 1.00).  Renal artery duplex 10/20/2017: No evidence of renal artery occlusive disease in either renal artery. Normal size both kidneys with normal intrarenal vascular perfusion is noted in the right kidney. Mild diffuse plaque noted in the abdominal aorta.  Carotid artery duplex  04/05/2019: Stenosis in the right carotid artery bulb of (<50%). Stenosis in the left internal carotid artery (50-69%). Stenosis in the left external carotid artery (>50%). Antegrade right vertebral artery flow. Antegrade left vertebral artery flow. No significant change since 04/23/2015. Follow up in six months is appropriate if clinically indicated.  Lower Extremity Arterial  Duplex 05/15/2019:  No hemodynamically significant stenoses are identified in the bilateral  lower extremity arterial system.  This exam reveals mildly decreased perfusion of the right lower extremity,  noted at the post tibial artery level (ABI 0.90) and mildly decreased  perfusion of the left lower extremity, noted at the post tibial artery  level (ABI 0.93). PVR waveforms a the PT suggests diffuse disease.  Compared to 01/19/2018, ABI has mildly decreased bilaterally from normal.   Assessment     ICD-10-CM   1. Atherosclerosis of native artery of both lower extremities with intermittent claudication ([place])  I70.213   2. Spinal stenosis of lumbar region with neurogenic claudication  M48.062   3. Primary hypertension  I10   4. Hyperlipidemia LDL goal <70  E78.5     EKG 12/28/2018: Normal sinus rhythm at 77 bpm with first-degree AV block, left axis deviation, left anterior fascicular block.  Cannot exclude inferior infarct old.  Right bundle branch block.  Bifascicular block no evidence of ischemia.  No changes compared to 01/31/2018.  Recommendations:   I have discussed the recent lower extremity duplex with the patient, bilateral ABI has slightly decreased compared to previous lower extremity duplex in 2019.  Although I do suspect his spinal stenosis is contributing to his symptoms of claudication, he is clearly had worsening claudication symptoms over the last few months.  Given his worsening symptoms and lower extremity duplex findings, will start him on Pletal 50 mg twice daily.  Hopefully this will help improve some of his symptoms of claudication.  He will need continued evaluation for spinal stenosis.  Blood pressure is well controlled, he is on appropriate medical therapy.  No symptoms of  angina or clinical evidence of heart failure.  I have reviewed his recent lipid panel, since being on Nexlizet, his lipids have significantly improved and LDL is now less than 70.  We will need to try  to achieve LDL goal of less than 55 given his diabetes.  Will need to have this reevaluated in the next few months.  I will see him back in 4 to 6 weeks for follow-up on claudication.   Miquel Dunn, MSN, APRN, FNP-C [facility]. [place] Office: [phone] Fax: [phone]

## 2019-05-26 DIAGNOSIS — R102 Pelvic and perineal pain: Secondary | ICD-10-CM | POA: Diagnosis not present

## 2019-05-26 DIAGNOSIS — C61 Malignant neoplasm of prostate: Secondary | ICD-10-CM | POA: Diagnosis not present

## 2019-05-26 DIAGNOSIS — R3121 Asymptomatic microscopic hematuria: Secondary | ICD-10-CM | POA: Diagnosis not present

## 2019-06-19 DIAGNOSIS — R102 Pelvic and perineal pain: Secondary | ICD-10-CM | POA: Diagnosis not present

## 2019-06-19 DIAGNOSIS — M6281 Muscle weakness (generalized): Secondary | ICD-10-CM | POA: Diagnosis not present

## 2019-06-19 DIAGNOSIS — M62838 Other muscle spasm: Secondary | ICD-10-CM | POA: Diagnosis not present

## 2019-06-19 DIAGNOSIS — M6289 Other specified disorders of muscle: Secondary | ICD-10-CM | POA: Diagnosis not present

## 2019-07-01 ENCOUNTER — Other Ambulatory Visit: Payer: Self-pay

## 2019-07-01 ENCOUNTER — Emergency Department (HOSPITAL_COMMUNITY): Payer: Medicare Other

## 2019-07-01 ENCOUNTER — Inpatient Hospital Stay (HOSPITAL_COMMUNITY)
Admission: EM | Admit: 2019-07-01 | Discharge: 2019-07-03 | DRG: 054 | Disposition: A | Discharge status: Home / Self Care | Payer: Medicare Other | Attending: Internal Medicine | Admitting: Internal Medicine

## 2019-07-01 ENCOUNTER — Encounter (HOSPITAL_COMMUNITY): Payer: Self-pay

## 2019-07-01 ENCOUNTER — Inpatient Hospital Stay (HOSPITAL_COMMUNITY): Payer: Medicare Other

## 2019-07-01 DIAGNOSIS — I639 Cerebral infarction, unspecified: Secondary | ICD-10-CM | POA: Diagnosis not present

## 2019-07-01 DIAGNOSIS — E785 Hyperlipidemia, unspecified: Secondary | ICD-10-CM | POA: Diagnosis present

## 2019-07-01 DIAGNOSIS — C719 Malignant neoplasm of brain, unspecified: Secondary | ICD-10-CM | POA: Diagnosis not present

## 2019-07-01 DIAGNOSIS — Z888 Allergy status to other drugs, medicaments and biological substances status: Secondary | ICD-10-CM

## 2019-07-01 DIAGNOSIS — Z7982 Long term (current) use of aspirin: Secondary | ICD-10-CM

## 2019-07-01 DIAGNOSIS — E1159 Type 2 diabetes mellitus with other circulatory complications: Secondary | ICD-10-CM | POA: Diagnosis present

## 2019-07-01 DIAGNOSIS — Z8673 Personal history of transient ischemic attack (TIA), and cerebral infarction without residual deficits: Secondary | ICD-10-CM | POA: Diagnosis not present

## 2019-07-01 DIAGNOSIS — G936 Cerebral edema: Secondary | ICD-10-CM | POA: Diagnosis present

## 2019-07-01 DIAGNOSIS — K5903 Drug induced constipation: Secondary | ICD-10-CM | POA: Diagnosis present

## 2019-07-01 DIAGNOSIS — I2510 Atherosclerotic heart disease of native coronary artery without angina pectoris: Secondary | ICD-10-CM | POA: Diagnosis present

## 2019-07-01 DIAGNOSIS — Z8546 Personal history of malignant neoplasm of prostate: Secondary | ICD-10-CM

## 2019-07-01 DIAGNOSIS — R42 Dizziness and giddiness: Secondary | ICD-10-CM | POA: Diagnosis not present

## 2019-07-01 DIAGNOSIS — R112 Nausea with vomiting, unspecified: Secondary | ICD-10-CM | POA: Diagnosis present

## 2019-07-01 DIAGNOSIS — Z841 Family history of disorders of kidney and ureter: Secondary | ICD-10-CM | POA: Diagnosis not present

## 2019-07-01 DIAGNOSIS — G9389 Other specified disorders of brain: Secondary | ICD-10-CM | POA: Diagnosis not present

## 2019-07-01 DIAGNOSIS — I10 Essential (primary) hypertension: Secondary | ICD-10-CM | POA: Diagnosis not present

## 2019-07-01 DIAGNOSIS — Z803 Family history of malignant neoplasm of breast: Secondary | ICD-10-CM

## 2019-07-01 DIAGNOSIS — H538 Other visual disturbances: Secondary | ICD-10-CM | POA: Diagnosis present

## 2019-07-01 DIAGNOSIS — F419 Anxiety disorder, unspecified: Secondary | ICD-10-CM | POA: Diagnosis present

## 2019-07-01 DIAGNOSIS — E1140 Type 2 diabetes mellitus with diabetic neuropathy, unspecified: Secondary | ICD-10-CM | POA: Diagnosis present

## 2019-07-01 DIAGNOSIS — Z823 Family history of stroke: Secondary | ICD-10-CM

## 2019-07-01 DIAGNOSIS — Z6828 Body mass index (BMI) 28.0-28.9, adult: Secondary | ICD-10-CM

## 2019-07-01 DIAGNOSIS — Z87891 Personal history of nicotine dependence: Secondary | ICD-10-CM

## 2019-07-01 DIAGNOSIS — Z7902 Long term (current) use of antithrombotics/antiplatelets: Secondary | ICD-10-CM

## 2019-07-01 DIAGNOSIS — I252 Old myocardial infarction: Secondary | ICD-10-CM

## 2019-07-01 DIAGNOSIS — I452 Bifascicular block: Secondary | ICD-10-CM | POA: Diagnosis present

## 2019-07-01 DIAGNOSIS — F329 Major depressive disorder, single episode, unspecified: Secondary | ICD-10-CM | POA: Diagnosis present

## 2019-07-01 DIAGNOSIS — E669 Obesity, unspecified: Secondary | ICD-10-CM | POA: Diagnosis present

## 2019-07-01 DIAGNOSIS — Z79899 Other long term (current) drug therapy: Secondary | ICD-10-CM | POA: Diagnosis not present

## 2019-07-01 DIAGNOSIS — R509 Fever, unspecified: Secondary | ICD-10-CM | POA: Diagnosis present

## 2019-07-01 DIAGNOSIS — Z951 Presence of aortocoronary bypass graft: Secondary | ICD-10-CM | POA: Diagnosis not present

## 2019-07-01 DIAGNOSIS — Z923 Personal history of irradiation: Secondary | ICD-10-CM

## 2019-07-01 DIAGNOSIS — R531 Weakness: Secondary | ICD-10-CM | POA: Diagnosis not present

## 2019-07-01 DIAGNOSIS — E860 Dehydration: Secondary | ICD-10-CM | POA: Diagnosis not present

## 2019-07-01 DIAGNOSIS — R220 Localized swelling, mass and lump, head: Secondary | ICD-10-CM | POA: Diagnosis not present

## 2019-07-01 DIAGNOSIS — Z20822 Contact with and (suspected) exposure to covid-19: Secondary | ICD-10-CM | POA: Diagnosis present

## 2019-07-01 DIAGNOSIS — I6200 Nontraumatic subdural hemorrhage, unspecified: Secondary | ICD-10-CM | POA: Diagnosis not present

## 2019-07-01 DIAGNOSIS — R0902 Hypoxemia: Secondary | ICD-10-CM | POA: Diagnosis not present

## 2019-07-01 DIAGNOSIS — H5340 Unspecified visual field defects: Secondary | ICD-10-CM | POA: Diagnosis present

## 2019-07-01 DIAGNOSIS — I6522 Occlusion and stenosis of left carotid artery: Secondary | ICD-10-CM | POA: Diagnosis not present

## 2019-07-01 LAB — CBC WITH DIFFERENTIAL/PLATELET
Abs Immature Granulocytes: 0.05 10*3/uL (ref 0.00–0.07)
Basophils Absolute: 0 10*3/uL (ref 0.0–0.1)
Basophils Relative: 1 %
Eosinophils Absolute: 0 10*3/uL (ref 0.0–0.5)
Eosinophils Relative: 0 %
HCT: 43 % (ref 39.0–52.0)
Hemoglobin: 14 g/dL (ref 13.0–17.0)
Immature Granulocytes: 1 %
Lymphocytes Relative: 4 %
Lymphs Abs: 0.3 10*3/uL — ABNORMAL LOW (ref 0.7–4.0)
MCH: 28.8 pg (ref 26.0–34.0)
MCHC: 32.6 g/dL (ref 30.0–36.0)
MCV: 88.5 fL (ref 80.0–100.0)
Monocytes Absolute: 0.4 10*3/uL (ref 0.1–1.0)
Monocytes Relative: 5 %
Neutro Abs: 6.9 10*3/uL (ref 1.7–7.7)
Neutrophils Relative %: 89 %
Platelets: 215 10*3/uL (ref 150–400)
RBC: 4.86 MIL/uL (ref 4.22–5.81)
RDW: 12.4 % (ref 11.5–15.5)
WBC: 7.8 10*3/uL (ref 4.0–10.5)
nRBC: 0 % (ref 0.0–0.2)

## 2019-07-01 LAB — COMPREHENSIVE METABOLIC PANEL
ALT: 13 U/L (ref 0–44)
AST: 16 U/L (ref 15–41)
Albumin: 4.1 g/dL (ref 3.5–5.0)
Alkaline Phosphatase: 61 U/L (ref 38–126)
Anion gap: 10 (ref 5–15)
BUN: 13 mg/dL (ref 8–23)
CO2: 25 mmol/L (ref 22–32)
Calcium: 9 mg/dL (ref 8.9–10.3)
Chloride: 98 mmol/L (ref 98–111)
Creatinine, Ser: 0.95 mg/dL (ref 0.61–1.24)
GFR calc Af Amer: >60 mL/min (ref >60)
GFR calc non Af Amer: >60 mL/min (ref >60)
Glucose, Bld: 151 mg/dL — ABNORMAL HIGH (ref 70–99)
Potassium: 4.4 mmol/L (ref 3.5–5.1)
Sodium: 133 mmol/L — ABNORMAL LOW (ref 135–145)
Total Bilirubin: 1.1 mg/dL (ref 0.3–1.2)
Total Protein: 7.1 g/dL (ref 6.5–8.1)

## 2019-07-01 LAB — POC SARS CORONAVIRUS 2 AG -  ED: SARS Coronavirus 2 Ag: NEGATIVE

## 2019-07-01 MED ORDER — ACETAMINOPHEN 325 MG PO TABS
650.0000 mg | ORAL_TABLET | ORAL | Status: DC | PRN
  Administered 2019-07-02: 650 mg via ORAL
  Filled 2019-07-01: qty 2

## 2019-07-01 MED ORDER — SODIUM CHLORIDE 0.9 % IV SOLN: INTRAVENOUS | Status: DC

## 2019-07-01 MED ORDER — ONDANSETRON HCL 4 MG/2ML IJ SOLN: 4.0000 mg | Freq: Four times a day (QID) | INTRAMUSCULAR | Status: DC | PRN

## 2019-07-01 MED ORDER — LORAZEPAM 2 MG/ML IJ SOLN
2.0000 mg | Freq: Once | INTRAMUSCULAR | Status: AC
  Administered 2019-07-01: 2 mg via INTRAVENOUS
  Filled 2019-07-01: qty 1

## 2019-07-01 MED ORDER — STROKE: EARLY STAGES OF RECOVERY BOOK
Freq: Once | Status: AC
  Filled 2019-07-01: qty 1

## 2019-07-01 MED ORDER — DEXAMETHASONE 4 MG PO TABS
4.0000 mg | ORAL_TABLET | Freq: Four times a day (QID) | ORAL | Status: DC
  Administered 2019-07-02 – 2019-07-03 (×5): 4 mg via ORAL
  Filled 2019-07-01 (×5): qty 1

## 2019-07-01 MED ORDER — DEXAMETHASONE SODIUM PHOSPHATE 10 MG/ML IJ SOLN
10.0000 mg | Freq: Once | INTRAMUSCULAR | Status: AC
  Administered 2019-07-02: 10 mg via INTRAVENOUS
  Filled 2019-07-01 (×2): qty 1

## 2019-07-01 MED ORDER — GADOBUTROL 1 MMOL/ML IV SOLN
10.0000 mL | Freq: Once | INTRAVENOUS | Status: AC | PRN
  Administered 2019-07-01: 10 mL via INTRAVENOUS

## 2019-07-01 MED ORDER — SENNOSIDES-DOCUSATE SODIUM 8.6-50 MG PO TABS: 1.0000 | ORAL_TABLET | Freq: Every evening | ORAL | Status: DC | PRN

## 2019-07-01 MED ORDER — ACETAMINOPHEN 650 MG RE SUPP: 650.0000 mg | RECTAL | Status: DC | PRN

## 2019-07-01 MED ORDER — ACETAMINOPHEN 160 MG/5ML PO SOLN: 650.0000 mg | ORAL | Status: DC | PRN

## 2019-07-01 NOTE — Consult Note (Signed)
Neurology Consultation  Reason for Consult: Blurred vision Referring Physician: Dr. Roderic Palau ED provider, Janetta Hora, ED PA-C  CC: Blurred vision  History is obtained from: Patient, chart  HPI: [name] is a 74 y.o. male past medical history of coronary artery disease, prostate cancer, diabetes, hypertension, hyperlipidemia stroke with no residual deficit presenting for evaluation of generalized body aches and some vision changes. He says that he has been having some headaches that started since Wednesday last week and some dizziness with nausea and vomiting.  He did not make much of it until 2 days ago when he started noticing some blurred vision which he says in his left eye. The headache has not improved since and has been nagging.  He had a mild low-grade fever of 99 Fahrenheit.  No upper respiratory symptoms. No chest pain.  No exposure to Covid patients. Reports chronic weakness in the right leg after having back surgeries. In the ED, CT head was done which showed a right occipital lesion concerning for stroke versus mass.  As he was pending further investigation with the MRI, he was already admitted to the hospitalist by the outgoing ED team and the results of the MRI, at the time of this dictation are still being dictated but I spoke with the neuroradiologist and the right occipital lesion is concerning for a possible mass-?  Glioma.   ROS: Performed and negative except as noted in HPI  Past Medical History:  Diagnosis Date  . Anxiety   . ASCVD (arteriosclerotic cardiovascular disease)    03/2004: DES x2 to the RCA; IMI with stent occlusion in 02/2005- suboptimal Plavix compliance  . CAD (coronary artery disease)   . Carcinoma of prostate ([place])    Clopidogrel held for biopsy  . Constipation due to pain medication   . Diabetes mellitus without complication ([place])   . Diabetic neuropathy ([place])   . Hyperlipidemia   . Hypertension   . Mild depression ([place])   . Morbid  obesity ([place])   . Myocardial infarction ([place])   . Shortness of breath dyspnea   . Stroke ([place])   . Tobacco abuse    stopped smoking 06/28/09    Family History  Problem Relation Age of Onset  . Kidney disease Mother   . Stroke Father   . Breast cancer Sister        breast progressed into bone  . Colon cancer Neg Hx   . Prostate cancer Neg Hx   . Pancreatic cancer Neg Hx    Social History:   reports that he quit smoking about 11 years ago. His smoking use included cigarettes. He has a 40.00 pack-year smoking history. He quit smokeless tobacco use about 10 years ago. He reports previous alcohol use of about 3.0 standard drinks of alcohol per week. He reports that he does not use drugs.  Medications  Current Facility-Administered Medications:  .  0.9 %  sodium chloride infusion, , Intravenous, Continuous, Opyd, Ilene Qua, MD, Last Rate: 75 mL/hr at 07/01/19 2220, New Bag at 07/01/19 2220 .  acetaminophen (TYLENOL) tablet 650 mg, 650 mg, Oral, Q4H PRN **OR** acetaminophen (TYLENOL) 160 MG/5ML solution 650 mg, 650 mg, Per Tube, Q4H PRN **OR** acetaminophen (TYLENOL) suppository 650 mg, 650 mg, Rectal, Q4H PRN, Opyd, Timothy S, MD .  senna-docusate (Senokot-S) tablet 1 tablet, 1 tablet, Oral, QHS PRN, Opyd, Ilene Qua, MD  Exam: Current vital signs: BP (!) 159/76 (BP Location: Right Arm)   Pulse 86   Temp 99.1  F (37.3 C) (Oral)   Resp (!) 21   Ht 6\' 2"  (1.88 m)   Wt 101.9 kg   SpO2 96%   BMI 28.84 kg/m  Vital signs in last 24 hours: Temp:  [99.1 F (37.3 C)-100.8 F (38.2 C)] 99.1 F (37.3 C) (03/13 2212) Pulse Rate:  [78-86] 86 (03/13 2212) Resp:  [15-21] 21 (03/13 2212) BP: (142-162)/(76-105) 159/76 (03/13 2212) SpO2:  [93 %-97 %] 96 % (03/13 2212) Weight:  [101.9 kg-107.5 kg] 101.9 kg (03/13 2212)  General: Awake alert in no distress HEENT: Normocephalic atraumatic Respiratory: Clear Neurological exam Awake alert oriented x3 Speech is mildly slow but not really  dysarthric. No evidence of aphasia Follows all commands consistently. Speech is fluent Cranial nerves: Pupils equal round reactive to light, extraocular movements intact, visual field examination shows left homonymous hemianopsia, face appears symmetric, facial sensation intact, tongue and palate midline. Motor exam: Antigravity without drift in all extremities with the exception of right lower extremity exam which is marred by some pain and chronic weakness post his prior back surgeries. Sensory exam: Intact bilaterally without extinction Cerebellar: No gross dysmetria Gait testing deferred at this time  Labs I have reviewed labs in epic and the results pertinent to this consultation are:  CBC    Component Value Date/Time   WBC 7.8 07/01/2019 1801   RBC 4.86 07/01/2019 1801   HGB 14.0 07/01/2019 1801   HGB 14.6 08/24/2018 0809   HCT 43.0 07/01/2019 1801   HCT 44.6 08/24/2018 0809   PLT 215 07/01/2019 1801   PLT 206 08/24/2018 0809   MCV 88.5 07/01/2019 1801   MCV 88 08/24/2018 0809   MCH 28.8 07/01/2019 1801   MCHC 32.6 07/01/2019 1801   RDW 12.4 07/01/2019 1801   RDW 13.1 08/24/2018 0809   LYMPHSABS 0.3 (L) 07/01/2019 1801   MONOABS 0.4 07/01/2019 1801   EOSABS 0.0 07/01/2019 1801   BASOSABS 0.0 07/01/2019 1801    CMP     Component Value Date/Time   NA 133 (L) 07/01/2019 1801   NA 139 05/04/2019 0954   K 4.4 07/01/2019 1801   CL 98 07/01/2019 1801   CO2 25 07/01/2019 1801   GLUCOSE 151 (H) 07/01/2019 1801   BUN 13 07/01/2019 1801   BUN 11 05/04/2019 0954   CREATININE 0.95 07/01/2019 1801   CALCIUM 9.0 07/01/2019 1801   PROT 7.1 07/01/2019 1801   PROT 6.8 05/04/2019 0954   ALBUMIN 4.1 07/01/2019 1801   ALBUMIN 4.5 05/04/2019 0954   AST 16 07/01/2019 1801   ALT 13 07/01/2019 1801   ALKPHOS 61 07/01/2019 1801   BILITOT 1.1 07/01/2019 1801   BILITOT 0.7 05/04/2019 0954   GFRNONAA >60 07/01/2019 1801   GFRAA >60 07/01/2019 1801    Imaging I have reviewed  the images obtained: CT head with a right occipital lesion concerning for stroke versus mass with some hemorrhagic component to it.  MRI brain with and without contrast consistent with a lesion in the right occipital lobe with enhancement around it and blood products noted on the SWI sequence.  Flair also shows extensive vasogenic edema around the lesion making it more likely a mass.  Differentials could include a glioma.  Formal read pending MRI head and neck with no large vessel occlusion.  Assessment: 74 year old man with above past medical history presents for evaluation of headaches that have been ongoing for about 10 days and blurred vision which on examination is left homonymous hemianopsia, which has been ongoing  for 3 to 4 days noted to have hypodensity with some hemorrhagic component on the CT head and the ensuing MRI reveals a possible mass in that area. He has been admitted for further work-up. From a neurological standpoint, he needs neurosurgical and neuro oncological consultation. I would also recommend steroids as below.  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 -Consult neurosurgery in the morning -Consult [name], neuro-oncology at [facility], for potential outpatient evaluation next week. -No history of seizures.  Antiepileptics. -Will probably need ongoing evaluation by neurosurgery for potential biopsy and then neuro-oncology for treatment modalities going forward. -Plan was discussed with the patient in detail. -I have also relayed my plan to Dr. Myna Hidalgo over the phone. -Inpatient neurology will be available as needed.  -- Amie Portland, MD [facility] Pager: [phone] If 7pm to 7am, please call on call as listed on AMION.

## 2019-07-01 NOTE — ED Triage Notes (Signed)
Pt bib ems, reporting that the pt has had generalized weakness with some visual disturbances over the last few days. Also reporting some dizzyness today with standing.Pt also reports that he's having some nausea with vomiting. Right BBB.   265ml fludi given.  170/76 82pusle 172cbg 99.1*f [id number]

## 2019-07-01 NOTE — ED Provider Notes (Signed)
[place] EMERGENCY DEPARTMENT Provider Note   CSN: [id number] Arrival date & time: 07/01/19  1659     History Chief Complaint  Patient presents with  . Generalized Body Aches    [name] is a 74 y.o. male with history of CVA, CAD, DM, HTN, prostate cancer who presents with a headache. He states that the headache started relatively suddenly 3 days ago after eating breakfast and was asssociated with dizziness, N/V, and blurry vision in the left eye. The headache is constant. Nothing makes it better or worse. He reports associated chills. His wife has been taking his temp at home with Tmax of 99. He denies URI symptoms, CP, SOB, cough, diarrhea, or urinary symptoms. He has been having some lower abdominal pain which is chronic since having radiation of his prostate last fall.  HPI     Past Medical History:  Diagnosis Date  . Anxiety   . ASCVD (arteriosclerotic cardiovascular disease)    03/2004: DES x2 to the RCA; IMI with stent occlusion in 02/2005- suboptimal Plavix compliance  . CAD (coronary artery disease)   . Carcinoma of prostate ([place])    Clopidogrel held for biopsy  . Constipation due to pain medication   . Diabetes mellitus without complication ([place])   . Diabetic neuropathy ([place])   . Hyperlipidemia   . Hypertension   . Mild depression ([place])   . Morbid obesity ([place])   . Myocardial infarction ([place])   . Shortness of breath dyspnea   . Stroke ([place])   . Tobacco abuse    stopped smoking 06/28/09    Patient Active Problem List   Diagnosis Date Noted  . Atherosclerosis of native artery of both lower extremities with intermittent claudication ([place]) 08/23/2018  . Atherosclerosis of native coronary artery of native heart without angina pectoris 08/23/2018  . Dyspnea on exertion 08/23/2018  . Biochemically recurrent malignant neoplasm of prostate 08/16/2018  . S/P carotid endarterectomy 08/20/2015  . Essential hypertension 08/20/2015  . Type 2  diabetes mellitus with circulatory disorder ([place]) 08/20/2015  . HLD (hyperlipidemia) 08/20/2015  . Carotid artery stenosis, symptomatic 07/03/2015  . Carotid stenosis, right   . Cerebrovascular accident (CVA) due to stenosis of right carotid artery ([place]) 06/19/2015  . TIA (transient ischemic attack) 06/18/2015  . ASCVD (arteriosclerotic cardiovascular disease) 06/18/2015  . Diabetes mellitus without complication ([place]) [id number]  . Resistant hypertension 06/18/2015  . Hyperlipidemia 06/18/2015  . Spinal stenosis of lumbar region with neurogenic claudication 09/06/2014  . Atherosclerosis of native artery of extremity with intermittent claudication ([place]) 03/09/2013  . HYPERLIPIDEMIA 04/08/2009  . TOBACCO ABUSE 04/08/2009  . ATHEROSCLEROTIC CARDIOVASCULAR DISEASE 04/08/2009  . INTERMITTENT VERTIGO 04/08/2009  . ADENOCARCINOMA, PROSTATE 03/21/2009  . ABDOMINAL PAIN -GENERALIZED 03/21/2009  . History of coronary artery bypass graft 06/28/2008    Past Surgical History:  Procedure Laterality Date  . BACK SURGERY    . CARDIAC CATHETERIZATION     X 1 stent before having CABG  . CORONARY ARTERY BYPASS GRAFT  06/28/2008   X4  . ENDARTERECTOMY Right 07/03/2015   Procedure: RIGHT CAROTID ENDARTERECTOMY WITH BOVINE PERICARDIUM PATCH ANGIOPLASTY;  Surgeon: Serafina Mitchell, MD;  Location: [facility];  Service: Vascular;  Laterality: Right;  . LUMBAR LAMINECTOMY/DECOMPRESSION MICRODISCECTOMY N/A 09/06/2014   Procedure: LUMBER DECOMPRESSION L3-5;  Surgeon: Melina Schools, MD;  Location: [facility];  Service: Orthopedics;  Laterality: N/A;  . LUMBAR SPINE SURGERY  2002  . PROSTATE BIOPSY    . PROSTATECTOMY  02/2007  .  VASECTOMY         Family History  Problem Relation Age of Onset  . Kidney disease Mother   . Stroke Father   . Breast cancer Sister        breast progressed into bone  . Colon cancer Neg Hx   . Prostate cancer Neg Hx   . Pancreatic cancer Neg Hx     Social History   Tobacco Use  .  Smoking status: Former Smoker    Packs/day: 1.00    Years: 40.00    Pack years: 40.00    Types: Cigarettes    Quit date: 06/28/2008    Years since quitting: 11.0  . Smokeless tobacco: Former Systems developer    Quit date: 06/28/2009  Substance Use Topics  . Alcohol use: Not Currently    Alcohol/week: 3.0 standard drinks    Types: 2 Glasses of wine, 1 Cans of beer per week  . Drug use: No    Home Medications Prior to Admission medications   Medication Sig Start Date End Date Taking? Authorizing Provider  amLODipine (NORVASC) 10 MG tablet TAKE 1 TABLET BY MOUTH DAILY 04/12/19   Miquel Dunn, NP  aspirin EC 81 MG tablet Take 81 mg by mouth daily.    [provider]  atorvastatin (LIPITOR) 80 MG tablet TAKE 1 TABLET BY MOUTH DAILY AT 6 PM 12/19/18   Miquel Dunn, NP  baclofen (LIORESAL) 20 MG tablet Take 20 mg by mouth 2 (two) times daily.     [provider]  Bempedoic Acid-Ezetimibe (NEXLIZET) 180-10 MG TABS Take 1 tablet by mouth daily. 12/29/18   Miquel Dunn, NP  cilostazol (PLETAL) 50 MG tablet Take 1 tablet (50 mg total) by mouth 2 (two) times daily. 05/25/19   Miquel Dunn, NP  losartan-hydrochlorothiazide [facility]) 100-12.5 MG tablet Take 1 tablet by mouth daily.  03/09/17   [provider]  metoprolol succinate (TOPROL-XL) 25 MG 24 hr tablet TAKE 1 TABLET BY MOUTH DAILY 09/08/18   Miquel Dunn, NP  Naproxen Sodium (ALEVE PO) Take by mouth as needed.     [provider]    Allergies    Lisinopril  Review of Systems   Review of Systems  Constitutional: Positive for chills. Negative for fever.  HENT: Negative for ear pain, sinus pain and sore throat.   Eyes: Positive for visual disturbance.  Respiratory: Negative for cough and shortness of breath.   Cardiovascular: Negative for chest pain.  Gastrointestinal: Positive for abdominal pain, nausea and vomiting. Negative for diarrhea.  Genitourinary: Negative for  dysuria and frequency.  Musculoskeletal: Negative for back pain and neck pain.  Neurological: Positive for dizziness and headaches. Negative for syncope.  All other systems reviewed and are negative.   Physical Exam Updated Vital Signs BP (!) 158/103 (BP Location: Left Arm)   Pulse 78   Temp (!) 100.8 F (38.2 C) (Oral)   Resp 15   Ht 6\' 2"  (1.88 m)   Wt 107.5 kg   SpO2 95%   BMI 30.43 kg/m   Physical Exam Vitals and nursing note reviewed.  Constitutional:      General: He is not in acute distress.    Appearance: Normal appearance. He is well-developed. He is not ill-appearing.     Comments: Calm, cooperative. Pleasant. NAD  HENT:     Head: Normocephalic and atraumatic.  Eyes:     General: No scleral icterus.       Right eye: No discharge.  Left eye: No discharge.     Conjunctiva/sclera: Conjunctivae normal.     Pupils: Pupils are equal, round, and reactive to light.  Cardiovascular:     Rate and Rhythm: Normal rate and regular rhythm.  Pulmonary:     Effort: Pulmonary effort is normal. No respiratory distress.     Breath sounds: Normal breath sounds.  Abdominal:     General: There is no distension.     Palpations: Abdomen is soft.     Tenderness: There is no abdominal tenderness.  Musculoskeletal:     Cervical back: Normal range of motion.  Skin:    General: Skin is warm and dry.  Neurological:     Mental Status: He is alert and oriented to person, place, and time.     Comments: Mental Status:  Alert, oriented, thought content appropriate, able to give a coherent history. Speech fluent without evidence of aphasia. Able to follow 2 step commands without difficulty.  Cranial Nerves:  II:  Pupils equal, round, reactive to light. Peripheral vision is decreased on the left side III,IV, VI: ptosis not present, extra-ocular motions intact bilaterally  V,VII: smile symmetric, facial light touch sensation equal VIII: hearing grossly normal to voice  X: uvula  elevates symmetrically  XI: bilateral shoulder shrug symmetric and strong XII: midline tongue extension without fassiculations Motor:  Normal tone. 5/5 in upper and lower extremities bilaterally including strong and equal grip strength and dorsiflexion/plantar flexion Sensory: Pinprick and light touch normal in all extremities.  Deep Tendon Reflexes: 2+ and symmetric in the biceps and patella Cerebellar: dysmetria on the left Gait: not tested CV: distal pulses palpable throughout    Psychiatric:        Behavior: Behavior normal.     ED Results / Procedures / Treatments   Labs (all labs ordered are listed, but only abnormal results are displayed) Labs Reviewed  COMPREHENSIVE METABOLIC PANEL - Abnormal; Notable for the following components:      Result Value   Sodium 133 (*)    Glucose, Bld 151 (*)    All other components within normal limits  CBC WITH DIFFERENTIAL/PLATELET - Abnormal; Notable for the following components:   Lymphs Abs 0.3 (*)    All other components within normal limits  SARS CORONAVIRUS 2 (TAT 6-24 HRS)  URINALYSIS, ROUTINE W REFLEX MICROSCOPIC  PROCALCITONIN  POC SARS CORONAVIRUS 2 AG -  ED    EKG None  Radiology CT Head Wo Contrast  Result Date: 07/01/2019 CLINICAL DATA:  Dizziness, weakness, visual disturbances EXAM: CT HEAD WITHOUT CONTRAST TECHNIQUE: Contiguous axial images were obtained from the base of the skull through the vertex without intravenous contrast. COMPARISON:  06/18/2015 FINDINGS: Brain: There is a hypodense lesion of the right occipital lobe with minimal associated subcortical hemorrhage (series 3, image 18). Periventricular white matter hypodensity. Vascular: No hyperdense vessel or unexpected calcification. Skull: Normal. Negative for fracture or focal lesion. Sinuses/Orbits: No acute finding. Other: None. IMPRESSION: Hypodense lesion of the right occipital lobe with minimal associated subcortical hemorrhage, most consistent with acute  to subacute infarction and minimal hemorrhagic transformation. No significant mass effect. Mass or metastatic lesion is a differential consideration. Consider contrast enhanced MRI to further evaluate. Electronically Signed   By: Eddie Candle M.D.   On: 07/01/2019 18:24   DG Chest Port 1 View  Result Date: 07/01/2019 CLINICAL DATA:  Fever and dizziness. EXAM: PORTABLE CHEST 1 VIEW COMPARISON:  February 16, 2007 FINDINGS: Multiple sternal wires are seen. This represents a new  finding when compared to the prior study. There is no evidence of acute infiltrate, pleural effusion or pneumothorax. The heart size and mediastinal contours are within normal limits. There is tortuosity of the descending thoracic aorta. Degenerative changes seen throughout the thoracic spine. IMPRESSION: 1. Interval median sternotomy since the prior study dated February 16, 2007. 2. No acute or active cardiopulmonary disease. Electronically Signed   By: Virgina Norfolk M.D.   On: 07/01/2019 17:48    Procedures Procedures (including critical care time)  Medications Ordered in ED Medications  LORazepam (ATIVAN) injection 2 mg (2 mg Intravenous Given 07/01/19 1949)    ED Course  I have reviewed the triage vital signs and the nursing notes.  Pertinent labs & imaging results that were available during my care of the patient were reviewed by me and considered in my medical decision making (see chart for details).  74 year old male presents with acute headache, dizziness, N/V, and visual field change for 2-3 days. BP is mildly elevated and he is febrile (temp 100.8). Neurologic exam is remarkable for decreased visual fields on the left side and left sided dysmetria. EKG is SR with RBBB. Heart is regular rate and rhythm. Lungs are CTA. Abdomen is soft and non-tender. Will order labs, CXR, UA, rapid COVID, CT head.  CBC is normal. CMP is remarkable for mild hyponatremia (133) and hyperglycemia (151). CXR is negative. CT head shows  lesion in the right occipital lobe which could be an acute infarction vs mass. Discussed with neurology who recommends MRI brain w/wo contrast and MRA head and neck.  Discussed with patient. He is agreeable to admission but reports significant anxiety with the MRI. Will order Ativan to pre-medicate.   Discussed with Dr. Myna Hidalgo with [facility] who will admit.  MDM Rules/Calculators/A&P                       Final Clinical Impression(s) / ED Diagnoses Final diagnoses:  Cerebrovascular accident (CVA), unspecified mechanism [facility])    Rx / DC Orders ED Discharge Orders    None       Recardo Evangelist, PA-C 07/01/19 2040    Milton Ferguson, MD 07/04/19 1123

## 2019-07-01 NOTE — H&P (Signed)
History and Physical    [name] [id number] DOB: February 06, 1946 DOA: 07/01/2019  PCP: Maurice Small, MD   Patient coming from: Home   Chief Complaint: Headache, N/V, fevers, vision disturbance  HPI: [name] is a 74 y.o. male with medical history significant for prostate cancer status post radiation, diabetes mellitus, coronary artery disease, and hypertension, now presenting to the ED for evaluation of headaches, fevers, nausea, and vomiting.  Patient reports that the symptoms began 2 to 3 days ago, have been persistent, and also accompanied by transient vision disturbance that he described as though he was looking through a prism, seeing different colors and multiple images.  The vision deficits seems to have resolved but the headache, fevers, and nausea persist.  He has not noted any focal numbness or weakness.  He denies sore throat or cough.  Denies any neck stiffness.  He had his second COVID-19 vaccine recently, unsure of the date, but does not think it has been a month yet.  ED Course: Upon arrival to the ED, patient is found to be febrile to 38.2 C, saturating well on room air, and with stable blood pressure.  EKG features sinus rhythm with RBBB, LAFB, LVH, similar to prior.  Chest x-rays negative for acute cardiopulmonary disease.  Head CT is concerning for right occipital lesion with minimal associated hemorrhage.  Chemistry panel with sodium 133 and CBC is unremarkable.  Covid antigen test was negative and PCR test was ordered.  Neurology was consulted by the ED physician and recommended MRI brain and MRA head and neck to further evaluate the lesion and determine disposition.  Hospitalist was consulted by the PA in the ED, reporting that neurology wanted the patient admitted to the medical service.  Review of Systems:  All other systems reviewed and apart from HPI, are negative.  Past Medical History:  Diagnosis Date  . Anxiety   . ASCVD (arteriosclerotic cardiovascular  disease)    03/2004: DES x2 to the RCA; IMI with stent occlusion in 02/2005- suboptimal Plavix compliance  . CAD (coronary artery disease)   . Carcinoma of prostate ([place])    Clopidogrel held for biopsy  . Constipation due to pain medication   . Diabetes mellitus without complication ([place])   . Diabetic neuropathy ([place])   . Hyperlipidemia   . Hypertension   . Mild depression ([place])   . Morbid obesity ([place])   . Myocardial infarction ([place])   . Shortness of breath dyspnea   . Stroke ([place])   . Tobacco abuse    stopped smoking 06/28/09    Past Surgical History:  Procedure Laterality Date  . BACK SURGERY    . CARDIAC CATHETERIZATION     X 1 stent before having CABG  . CORONARY ARTERY BYPASS GRAFT  06/28/2008   X4  . ENDARTERECTOMY Right 07/03/2015   Procedure: RIGHT CAROTID ENDARTERECTOMY WITH BOVINE PERICARDIUM PATCH ANGIOPLASTY;  Surgeon: Serafina Mitchell, MD;  Location: [place];  Service: Vascular;  Laterality: Right;  . LUMBAR LAMINECTOMY/DECOMPRESSION MICRODISCECTOMY N/A 09/06/2014   Procedure: LUMBER DECOMPRESSION L3-5;  Surgeon: Melina Schools, MD;  Location: [place];  Service: Orthopedics;  Laterality: N/A;  . LUMBAR SPINE SURGERY  2002  . PROSTATE BIOPSY    . PROSTATECTOMY  02/2007  . VASECTOMY       reports that he quit smoking about 11 years ago. His smoking use included cigarettes. He has a 40.00 pack-year smoking history. He quit smokeless tobacco use about 10 years ago. He reports  previous alcohol use of about 3.0 standard drinks of alcohol per week. He reports that he does not use drugs.  Allergies  Allergen Reactions  . Lisinopril Cough    Family History  Problem Relation Age of Onset  . Kidney disease Mother   . Stroke Father   . Breast cancer Sister        breast progressed into bone  . Colon cancer Neg Hx   . Prostate cancer Neg Hx   . Pancreatic cancer Neg Hx      Prior to Admission medications   Medication Sig Start Date End Date Taking? Authorizing Provider   amLODipine (NORVASC) 10 MG tablet TAKE 1 TABLET BY MOUTH DAILY 04/12/19   Miquel Dunn, NP  aspirin EC 81 MG tablet Take 81 mg by mouth daily.    [provider]  atorvastatin (LIPITOR) 80 MG tablet TAKE 1 TABLET BY MOUTH DAILY AT 6 PM 12/19/18   Miquel Dunn, NP  baclofen (LIORESAL) 20 MG tablet Take 20 mg by mouth 2 (two) times daily.     [provider]  Bempedoic Acid-Ezetimibe (NEXLIZET) 180-10 MG TABS Take 1 tablet by mouth daily. 12/29/18   Miquel Dunn, NP  cilostazol (PLETAL) 50 MG tablet Take 1 tablet (50 mg total) by mouth 2 (two) times daily. 05/25/19   Miquel Dunn, NP  losartan-hydrochlorothiazide [facility]) 100-12.5 MG tablet Take 1 tablet by mouth daily.  03/09/17   [provider]  metoprolol succinate (TOPROL-XL) 25 MG 24 hr tablet TAKE 1 TABLET BY MOUTH DAILY 09/08/18   Miquel Dunn, NP  Naproxen Sodium (ALEVE PO) Take by mouth as needed.     [provider]    Physical Exam: Vitals:   07/01/19 1710 07/01/19 1900 07/01/19 1930 07/01/19 1945  BP:  (!) 142/105 (!) 156/83 (!) 162/79  Pulse:  83 81 79  Resp:  18 (!) 21 20  Temp:      TempSrc:      SpO2:  97% 93% 93%  Weight: 107.5 kg     Height: 6\' 2"  (1.88 m)       Constitutional: NAD, calm  Eyes: PERTLA, lids and conjunctivae normal ENMT: Mucous membranes are moist. Posterior pharynx clear of any exudate or lesions.   Neck: normal, supple, no masses, no thyromegaly Respiratory:  no wheezing, no crackles. No accessory muscle use.  Cardiovascular: S1 & S2 heard, regular rate and rhythm. No extremity edema.   Abdomen: No distension, no tenderness, soft. Bowel sounds active.  Musculoskeletal: no clubbing / cyanosis. No joint deformity upper and lower extremities.   Skin: no significant rashes, lesions, ulcers. Warm, dry, well-perfused. Neurologic: CN 2-12 grossly intact. Sensation intact, DTR normal. Strength 5/5 in all 4 limbs.  Psychiatric:  Alert and oriented x 3. Pleasant and cooperative.    Labs and Imaging on Admission: I have personally reviewed following labs and imaging studies  CBC: Recent Labs  Lab 07/01/19 1801  WBC 7.8  NEUTROABS 6.9  HGB 14.0  HCT 43.0  MCV 88.5  PLT [id number]   Basic Metabolic Panel: Recent Labs  Lab 07/01/19 1801  NA 133*  K 4.4  CL 98  CO2 25  GLUCOSE 151*  BUN 13  CREATININE 0.95  CALCIUM 9.0   GFR: Estimated Creatinine Clearance: 90.4 mL/min (by C-G formula based on SCr of 0.95 mg/dL). Liver Function Tests: Recent Labs  Lab 07/01/19 1801  AST 16  ALT 13  ALKPHOS 61  BILITOT 1.1  PROT 7.1  ALBUMIN 4.1   No results for input(s): LIPASE, AMYLASE in the last 168 hours. No results for input(s): AMMONIA in the last 168 hours. Coagulation Profile: No results for input(s): INR, PROTIME in the last 168 hours. Cardiac Enzymes: No results for input(s): CKTOTAL, CKMB, CKMBINDEX, TROPONINI in the last 168 hours. BNP (last 3 results) No results for input(s): PROBNP in the last 8760 hours. HbA1C: No results for input(s): HGBA1C in the last 72 hours. CBG: No results for input(s): GLUCAP in the last 168 hours. Lipid Profile: No results for input(s): CHOL, HDL, LDLCALC, TRIG, CHOLHDL, LDLDIRECT in the last 72 hours. Thyroid Function Tests: No results for input(s): TSH, T4TOTAL, FREET4, T3FREE, THYROIDAB in the last 72 hours. Anemia Panel: No results for input(s): VITAMINB12, FOLATE, FERRITIN, TIBC, IRON, RETICCTPCT in the last 72 hours. Urine analysis:    Component Value Date/Time   COLORURINE YELLOW 07/01/2015 [place] 07/01/2015 1541   LABSPEC 1.027 07/01/2015 1541   PHURINE 5.5 07/01/2015 1541   GLUCOSEU NEGATIVE 07/01/2015 1541   HGBUR NEGATIVE 07/01/2015 1541   BILIRUBINUR NEGATIVE 07/01/2015 1541   KETONESUR NEGATIVE 07/01/2015 1541   PROTEINUR NEGATIVE 07/01/2015 1541   UROBILINOGEN 0.2 06/24/2010 1610   NITRITE NEGATIVE 07/01/2015 1541    LEUKOCYTESUR NEGATIVE 07/01/2015 1541   Sepsis Labs: @LABRCNTIP (procalcitonin:4,lacticidven:4) )No results found for this or any previous visit (from the past 240 hour(s)).   Radiological Exams on Admission: CT Head Wo Contrast  Result Date: 07/01/2019 CLINICAL DATA:  Dizziness, weakness, visual disturbances EXAM: CT HEAD WITHOUT CONTRAST TECHNIQUE: Contiguous axial images were obtained from the base of the skull through the vertex without intravenous contrast. COMPARISON:  06/18/2015 FINDINGS: Brain: There is a hypodense lesion of the right occipital lobe with minimal associated subcortical hemorrhage (series 3, image 18). Periventricular white matter hypodensity. Vascular: No hyperdense vessel or unexpected calcification. Skull: Normal. Negative for fracture or focal lesion. Sinuses/Orbits: No acute finding. Other: None. IMPRESSION: Hypodense lesion of the right occipital lobe with minimal associated subcortical hemorrhage, most consistent with acute to subacute infarction and minimal hemorrhagic transformation. No significant mass effect. Mass or metastatic lesion is a differential consideration. Consider contrast enhanced MRI to further evaluate. Electronically Signed   By: Eddie Candle M.D.   On: 07/01/2019 18:24   DG Chest Port 1 View  Result Date: 07/01/2019 CLINICAL DATA:  Fever and dizziness. EXAM: PORTABLE CHEST 1 VIEW COMPARISON:  February 16, 2007 FINDINGS: Multiple sternal wires are seen. This represents a new finding when compared to the prior study. There is no evidence of acute infiltrate, pleural effusion or pneumothorax. The heart size and mediastinal contours are within normal limits. There is tortuosity of the descending thoracic aorta. Degenerative changes seen throughout the thoracic spine. IMPRESSION: 1. Interval median sternotomy since the prior study dated February 16, 2007. 2. No acute or active cardiopulmonary disease. Electronically Signed   By: Virgina Norfolk M.D.   On:  07/01/2019 17:48    EKG: Independently reviewed. Sinus rhythm, RBBB, LAFB, LVH, similar to prior.   Assessment/Plan   1. 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 Fever  - CXR clear, chronic urinary symptoms reported and UA pending, COVID Ag neg and pcr pending, no meningismus  - Likely secondary to #1 or viral illness, follow up pending UA and COVID pcr, treat with APAP prn  3. CAD  - No anginal complaints  - Pharmacy med-rec pending   4. Hypertension  - BP at goal  - Pharmacy med-rec pending    DVT prophylaxis: SCDs  Code Status: Full  Family Communication: Wife updated by phone  Disposition Plan: Likely home in 1-2 days  Consults called: Neurology  Admission status: Observation     Vianne Bulls, MD [facility] Pager: See [email or web address]  If 7AM-7PM, please contact the daytime attending [email or web address]  07/01/2019, 9:52 PM

## 2019-07-01 NOTE — ED Notes (Signed)
Pt transported to CT ?

## 2019-07-01 NOTE — ED Notes (Signed)
P transported to MRI

## 2019-07-02 ENCOUNTER — Inpatient Hospital Stay (HOSPITAL_COMMUNITY): Payer: Medicare Other

## 2019-07-02 LAB — URINALYSIS, ROUTINE W REFLEX MICROSCOPIC
Bacteria, UA: NONE SEEN
Bilirubin Urine: NEGATIVE
Glucose, UA: 50 mg/dL — AB
Hgb urine dipstick: NEGATIVE
Ketones, ur: NEGATIVE mg/dL
Leukocytes,Ua: NEGATIVE
Nitrite: NEGATIVE
Protein, ur: 30 mg/dL — AB
Specific Gravity, Urine: 1.029 (ref 1.005–1.030)
pH: 5 (ref 5.0–8.0)

## 2019-07-02 LAB — SARS CORONAVIRUS 2 (TAT 6-24 HRS): SARS Coronavirus 2: NEGATIVE

## 2019-07-02 LAB — GLUCOSE, CAPILLARY: Glucose-Capillary: 198 mg/dL — ABNORMAL HIGH (ref 70–99)

## 2019-07-02 LAB — PROCALCITONIN: Procalcitonin: <0.1 ng/mL

## 2019-07-02 MED ORDER — METOPROLOL SUCCINATE ER 25 MG PO TB24
25.0000 mg | ORAL_TABLET | Freq: Every day | ORAL | Status: DC
  Administered 2019-07-02 – 2019-07-03 (×2): 25 mg via ORAL
  Filled 2019-07-02 (×2): qty 1

## 2019-07-02 MED ORDER — ATORVASTATIN CALCIUM 80 MG PO TABS
80.0000 mg | ORAL_TABLET | ORAL | Status: DC
  Administered 2019-07-02 – 2019-07-03 (×2): 80 mg via ORAL
  Filled 2019-07-02 (×2): qty 1

## 2019-07-02 MED ORDER — LOSARTAN POTASSIUM-HCTZ 100-12.5 MG PO TABS: 1.0000 | ORAL_TABLET | Freq: Every day | ORAL | Status: DC

## 2019-07-02 MED ORDER — IOHEXOL 300 MG/ML  SOLN
100.0000 mL | Freq: Once | INTRAMUSCULAR | Status: AC | PRN
  Administered 2019-07-02: 100 mL via INTRAVENOUS

## 2019-07-02 MED ORDER — CILOSTAZOL 50 MG PO TABS
50.0000 mg | ORAL_TABLET | Freq: Two times a day (BID) | ORAL | Status: DC
  Administered 2019-07-02 – 2019-07-03 (×3): 50 mg via ORAL
  Filled 2019-07-02 (×3): qty 1

## 2019-07-02 MED ORDER — AMLODIPINE BESYLATE 10 MG PO TABS
10.0000 mg | ORAL_TABLET | Freq: Every day | ORAL | Status: DC
  Administered 2019-07-02 – 2019-07-03 (×2): 10 mg via ORAL
  Filled 2019-07-02 (×2): qty 1

## 2019-07-02 MED ORDER — HYDROCHLOROTHIAZIDE 12.5 MG PO CAPS
12.5000 mg | ORAL_CAPSULE | Freq: Every day | ORAL | Status: DC
  Administered 2019-07-02 – 2019-07-03 (×2): 12.5 mg via ORAL
  Filled 2019-07-02 (×2): qty 1

## 2019-07-02 MED ORDER — LOSARTAN POTASSIUM 50 MG PO TABS
100.0000 mg | ORAL_TABLET | Freq: Every day | ORAL | Status: DC
  Administered 2019-07-02 – 2019-07-03 (×2): 100 mg via ORAL
  Filled 2019-07-02 (×2): qty 2

## 2019-07-02 NOTE — H&P (View-Only) (Signed)
Reason for Consult:right occipital mass Referring Physician: tydarius, [name] is an 74 y.o. male.  HPI: whom has noticed over a few weeks changes in what he sees. He works in an [employer], industrial/product and mentioned to his wife that he was pulling the wrong parts frequently. His vision on the left had deteriorated and prior to his admission he had scintillations in the left visual field. His wife had been asking him to see a physician and he finally did.   Past Medical History:  Diagnosis Date   Anxiety    ASCVD (arteriosclerotic cardiovascular disease)    03/2004: DES x2 to the RCA; IMI with stent occlusion in 02/2005- suboptimal Plavix compliance   CAD (coronary artery disease)    Carcinoma of prostate (HCC)    Clopidogrel held for biopsy   Constipation due to pain medication    Diabetes mellitus without complication (HCC)    Diabetic neuropathy (HCC)    Hyperlipidemia    Hypertension    Mild depression (HCC)    Morbid obesity ([place])    Myocardial infarction ([place])    Shortness of breath dyspnea    Stroke ([place])    Tobacco abuse    stopped smoking 06/28/09    Past Surgical History:  Procedure Laterality Date   BACK SURGERY     CARDIAC CATHETERIZATION     X 1 stent before having CABG   CORONARY ARTERY BYPASS GRAFT  06/28/2008   X4   ENDARTERECTOMY Right 07/03/2015   Procedure: RIGHT CAROTID ENDARTERECTOMY WITH BOVINE PERICARDIUM PATCH ANGIOPLASTY;  Surgeon: Serafina Mitchell, MD;  Location: [facility] OR;  Service: Vascular;  Laterality: Right;   LUMBAR LAMINECTOMY/DECOMPRESSION MICRODISCECTOMY N/A 09/06/2014   Procedure: LUMBER DECOMPRESSION L3-5;  Surgeon: Melina Schools, MD;  Location: [facility];  Service: Orthopedics;  Laterality: N/A;   LUMBAR SPINE SURGERY  2002   PROSTATE BIOPSY     PROSTATECTOMY  02/2007   VASECTOMY      Family History  Problem Relation Age of Onset   Kidney disease Mother    Stroke Father    Breast cancer Sister        breast progressed into bone    Colon cancer Neg Hx    Prostate cancer Neg Hx    Pancreatic cancer Neg Hx     Social History:  reports that he quit smoking about 11 years ago. His smoking use included cigarettes. He has a 40.00 pack-year smoking history. He quit smokeless tobacco use about 10 years ago. He reports previous alcohol use of about 3.0 standard drinks of alcohol per week. He reports that he does not use drugs.  Allergies:  Allergies  Allergen Reactions   Lisinopril Cough    Medications: I have reviewed the patient's current medications.  Results for orders placed or performed during the hospital encounter of 07/01/19 (from the past 48 hour(s))  Comprehensive metabolic panel     Status: Abnormal   Collection Time: 07/01/19  6:01 PM  Result Value Ref Range   Sodium 133 (L) 135 - 145 mmol/L   Potassium 4.4 3.5 - 5.1 mmol/L   Chloride 98 98 - 111 mmol/L   CO2 25 22 - 32 mmol/L   Glucose, Bld 151 (H) 70 - 99 mg/dL    Comment: Glucose reference range applies only to samples taken after fasting for at least 8 hours.   BUN 13 8 - 23 mg/dL   Creatinine, Ser 0.95 0.61 - 1.24 mg/dL   Calcium 9.0  8.9 - 10.3 mg/dL   Total Protein 7.1 6.5 - 8.1 g/dL   Albumin 4.1 3.5 - 5.0 g/dL   AST 16 15 - 41 U/L   ALT 13 0 - 44 U/L   Alkaline Phosphatase 61 38 - 126 U/L   Total Bilirubin 1.1 0.3 - 1.2 mg/dL   GFR calc non Af Amer >60 >60 mL/min   GFR calc Af Amer >60 >60 mL/min   Anion gap 10 5 - 15    Comment: Performed at [facility] [address] [place], [place] [address]  CBC with Differential     Status: Abnormal   Collection Time: 07/01/19  6:01 PM  Result Value Ref Range   WBC 7.8 4.0 - 10.5 K/uL   RBC 4.86 4.22 - 5.81 MIL/uL   Hemoglobin 14.0 13.0 - 17.0 g/dL   HCT 43.0 39.0 - 52.0 %   MCV 88.5 80.0 - 100.0 fL   MCH 28.8 26.0 - 34.0 pg   MCHC 32.6 30.0 - 36.0 g/dL   RDW 12.4 11.5 - 15.5 %   Platelets 215 150 - 400 K/uL   nRBC 0.0 0.0 - 0.2 %   Neutrophils Relative % 89 %   Neutro Abs 6.9 1.7 -  7.7 K/uL   Lymphocytes Relative 4 %   Lymphs Abs 0.3 (L) 0.7 - 4.0 K/uL   Monocytes Relative 5 %   Monocytes Absolute 0.4 0.1 - 1.0 K/uL   Eosinophils Relative 0 %   Eosinophils Absolute 0.0 0.0 - 0.5 K/uL   Basophils Relative 1 %   Basophils Absolute 0.0 0.0 - 0.1 K/uL   Immature Granulocytes 1 %   Abs Immature Granulocytes 0.05 0.00 - 0.07 K/uL    Comment: Performed at [facility] [address], [place],  [address]  Procalcitonin - Baseline     Status: None   Collection Time: 07/01/19  6:01 PM  Result Value Ref Range   Procalcitonin <0.10 ng/mL    Comment:        Interpretation: PCT (Procalcitonin) <= 0.5 ng/mL: Systemic infection (sepsis) is not likely. Local bacterial infection is possible. (NOTE)       Sepsis PCT Algorithm           Lower Respiratory Tract                                      Infection PCT Algorithm    ----------------------------     ----------------------------         PCT < 0.25 ng/mL                PCT < 0.10 ng/mL         Strongly encourage             Strongly discourage   discontinuation of antibiotics    initiation of antibiotics    ----------------------------     -----------------------------       PCT 0.25 - 0.50 ng/mL            PCT 0.10 - 0.25 ng/mL               OR       >80% decrease in PCT            Discourage initiation of  antibiotics      Encourage discontinuation           of antibiotics    ----------------------------     -----------------------------         PCT >= 0.50 ng/mL              PCT 0.26 - 0.50 ng/mL               AND        <80% decrease in PCT             Encourage initiation of                                             antibiotics       Encourage continuation           of antibiotics    ----------------------------     -----------------------------        PCT >= 0.50 ng/mL                  PCT > 0.50 ng/mL               AND         increase in PCT                   Strongly encourage                                      initiation of antibiotics    Strongly encourage escalation           of antibiotics                                     -----------------------------                                           PCT <= 0.25 ng/mL                                                 OR                                        > 80% decrease in PCT                                     Discontinue / Do not initiate                                             antibiotics Performed at [facility], [place] [address], [place], [place] [address]   POC SARS Coronavirus 2 Ag-ED - Nasal Swab (BD Veritor Kit)  Status: None   Collection Time: 07/01/19  7:22 PM  Result Value Ref Range   SARS Coronavirus 2 Ag NEGATIVE NEGATIVE    Comment: (NOTE) SARS-CoV-2 antigen NOT DETECTED.  Negative results are presumptive.  Negative results do not preclude SARS-CoV-2 infection and should not be used as the sole basis for treatment or other patient management decisions, including infection  control decisions, particularly in the presence of clinical signs and  symptoms consistent with COVID-19, or in those who have been in contact with the virus.  Negative results must be combined with clinical observations, patient history, and epidemiological information. The expected result is Negative. Fact Sheet for Patients: PodPark.tn Fact Sheet for Healthcare Providers: GiftContent.is This test is not yet approved or cleared by the [place] FDA and  has been authorized for detection and/or diagnosis of SARS-CoV-2 by FDA under an Emergency Use Authorization (EUA).  This EUA will remain in effect (meaning this test can be used) for the duration of  the COVID-19 de claration under Section 564(b)(1) of the Act, 21 U.S.C. section 360bbb-3(b)(1), unless the authorization is terminated or revoked sooner.   SARS  CORONAVIRUS 2 (TAT 6-24 HRS) Nasopharyngeal Nasopharyngeal Swab     Status: None   Collection Time: 07/01/19  7:41 PM   Specimen: Nasopharyngeal Swab  Result Value Ref Range   SARS Coronavirus 2 NEGATIVE NEGATIVE    Comment: (NOTE) SARS-CoV-2 target nucleic acids are NOT DETECTED. The SARS-CoV-2 RNA is generally detectable in upper and lower respiratory specimens during the acute phase of infection. Negative results do not preclude SARS-CoV-2 infection, do not rule out co-infections with other pathogens, and should not be used as the sole basis for treatment or other patient management decisions. Negative results must be combined with clinical observations, patient history, and epidemiological information. The expected result is Negative. Fact Sheet for Patients: SugarRoll.be Fact Sheet for Healthcare Providers: https://www.woods-mathews.com/ This test is not yet approved or cleared by the [place] FDA and  has been authorized for detection and/or diagnosis of SARS-CoV-2 by FDA under an Emergency Use Authorization (EUA). This EUA will remain  in effect (meaning this test can be used) for the duration of the COVID-19 declaration under Section 56 4(b)(1) of the Act, 21 U.S.C. section 360bbb-3(b)(1), unless the authorization is terminated or revoked sooner. Performed at [facility], [place] [address] [place], [place] [address]   Urinalysis, Routine w reflex microscopic     Status: Abnormal   Collection Time: 07/02/19 12:29 PM  Result Value Ref Range   Color, Urine YELLOW YELLOW   APPearance CLEAR CLEAR   Specific Gravity, Urine 1.029 1.005 - 1.030   pH 5.0 5.0 - 8.0   Glucose, UA 50 (A) NEGATIVE mg/dL   Hgb urine dipstick NEGATIVE NEGATIVE   Bilirubin Urine NEGATIVE NEGATIVE   Ketones, ur NEGATIVE NEGATIVE mg/dL   Protein, ur 30 (A) NEGATIVE mg/dL   Nitrite NEGATIVE NEGATIVE   Leukocytes,Ua NEGATIVE NEGATIVE   RBC / HPF 0-5 0 -  5 RBC/hpf   WBC, UA 0-5 0 - 5 WBC/hpf   Bacteria, UA NONE SEEN NONE SEEN   Squamous Epithelial / LPF 0-5 0 - 5    Comment: Performed at [facility], [place] [address], [place],  [address]    CT Head Wo Contrast  Result Date: 07/01/2019 CLINICAL DATA:  Dizziness, weakness, visual disturbances EXAM: CT HEAD WITHOUT CONTRAST TECHNIQUE: Contiguous axial images were obtained from the base of the skull through the vertex  without intravenous contrast. COMPARISON:  06/18/2015 FINDINGS: Brain: There is a hypodense lesion of the right occipital lobe with minimal associated subcortical hemorrhage (series 3, image 18). Periventricular white matter hypodensity. Vascular: No hyperdense vessel or unexpected calcification. Skull: Normal. Negative for fracture or focal lesion. Sinuses/Orbits: No acute finding. Other: None. IMPRESSION: Hypodense lesion of the right occipital lobe with minimal associated subcortical hemorrhage, most consistent with acute to subacute infarction and minimal hemorrhagic transformation. No significant mass effect. Mass or metastatic lesion is a differential consideration. Consider contrast enhanced MRI to further evaluate. Electronically Signed   By: Eddie Candle M.D.   On: 07/01/2019 18:24   MR ANGIO HEAD WO CONTRAST  Result Date: 07/01/2019 CLINICAL DATA:  Headache EXAM: MRI HEAD WITHOUT AND WITH CONTRAST MRA HEAD WITHOUT CONTRAST MRA NECK WITHOUT AND WITH CONTRAST TECHNIQUE: Multiplanar, multiecho pulse sequences of the brain and surrounding structures were obtained without and with intravenous contrast. Angiographic images of the Circle of Willis were obtained using MRA technique without intravenous contrast. Angiographic images of the neck were obtained using MRA technique without and with intravenous contrast. Carotid stenosis measurements (when applicable) are obtained utilizing NASCET criteria, using the distal internal carotid diameter as the denominator. CONTRAST:  81m  GADAVIST GADOBUTROL 1 MMOL/ML IV SOLN COMPARISON:  Head CT 07/01/2019 FINDINGS: MRI HEAD FINDINGS Brain: Diffusion abnormality in the right occipital lobe at the site of lesion identified on earlier CT. There are associated magnetic susceptibility effects due to known hemorrhage. There is moderate edema in the right occipital lobe that surround contrast-enhancing lesion, which measures 4.8 x 1.9 cm and extends to the border of the right lateral ventricle occipital horn. There is hemosiderin deposition throughout the lesion. There are no other contrast-enhancing lesions. There is no midline shift. No hydrocephalus. Vascular: Normal flow voids Skull and upper cervical spine: Normal marrow signal Sinuses/Orbits: Negative. Other: None MRA HEAD FINDINGS POSTERIOR CIRCULATION: --Vertebral arteries: Normal V4 segments. --Posterior inferior cerebellar arteries (PICA): Patent origins from the vertebral arteries. --Anterior inferior cerebellar arteries (AICA): Patent origins from the basilar artery. --Basilar artery: Normal. --Superior cerebellar arteries: Normal. --Posterior cerebral arteries: Normal. Both are predominantly supplied by the posterior communicating arteries (p-comm). ANTERIOR CIRCULATION: --Intracranial internal carotid arteries: Normal. --Anterior cerebral arteries (ACA): Normal. Both A1 segments are present. Patent anterior communicating artery (a-comm). --Middle cerebral arteries (MCA): Normal. MRA NECK FINDINGS Normal aortic branch pattern. Right subclavian artery visualized portion is normal. There is no appreciable flow related enhancement within the distal left subclavian artery. There is mild stenosis at the left carotid bifurcation with a short segment enhancement defect of the proximal external carotid artery. Both internal carotid arteries are normal. The vertebral arteries are codominant and patent along their entire visualized course. A short portion of the V3 segments is not visualized due to  field of view constraints. IMPRESSION: 1. 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 2. Normal intracranial MRA. 3. Mild stenosis of the left carotid bifurcation but otherwise no hemodynamically significant stenosis of the internal carotid or vertebral arteries. 4. Loss of enhancement within the distal left subclavian artery could indicate high-grade stenosis or occlusion. Electronically Signed   By: KUlyses JarredM.D.   On: 07/01/2019 22:36   MR Angiogram Neck W or Wo Contrast  Result Date: 07/01/2019 CLINICAL DATA:  Headache EXAM: MRI HEAD WITHOUT AND WITH CONTRAST MRA HEAD WITHOUT CONTRAST MRA NECK WITHOUT AND WITH CONTRAST TECHNIQUE: Multiplanar, multiecho pulse  sequences of the brain and surrounding structures were obtained without and with intravenous contrast. Angiographic images of the Circle of Willis were obtained using MRA technique without intravenous contrast. Angiographic images of the neck were obtained using MRA technique without and with intravenous contrast. Carotid stenosis measurements (when applicable) are obtained utilizing NASCET criteria, using the distal internal carotid diameter as the denominator. CONTRAST:  60m GADAVIST GADOBUTROL 1 MMOL/ML IV SOLN COMPARISON:  Head CT 07/01/2019 FINDINGS: MRI HEAD FINDINGS Brain: Diffusion abnormality in the right occipital lobe at the site of lesion identified on earlier CT. There are associated magnetic susceptibility effects due to known hemorrhage. There is moderate edema in the right occipital lobe that surround contrast-enhancing lesion, which measures 4.8 x 1.9 cm and extends to the border of the right lateral ventricle occipital horn. There is hemosiderin deposition throughout the lesion. There are no other contrast-enhancing lesions. There is no midline shift. No hydrocephalus. Vascular: Normal flow voids Skull and upper cervical spine:  Normal marrow signal Sinuses/Orbits: Negative. Other: None MRA HEAD FINDINGS POSTERIOR CIRCULATION: --Vertebral arteries: Normal V4 segments. --Posterior inferior cerebellar arteries (PICA): Patent origins from the vertebral arteries. --Anterior inferior cerebellar arteries (AICA): Patent origins from the basilar artery. --Basilar artery: Normal. --Superior cerebellar arteries: Normal. --Posterior cerebral arteries: Normal. Both are predominantly supplied by the posterior communicating arteries (p-comm). ANTERIOR CIRCULATION: --Intracranial internal carotid arteries: Normal. --Anterior cerebral arteries (ACA): Normal. Both A1 segments are present. Patent anterior communicating artery (a-comm). --Middle cerebral arteries (MCA): Normal. MRA NECK FINDINGS Normal aortic branch pattern. Right subclavian artery visualized portion is normal. There is no appreciable flow related enhancement within the distal left subclavian artery. There is mild stenosis at the left carotid bifurcation with a short segment enhancement defect of the proximal external carotid artery. Both internal carotid arteries are normal. The vertebral arteries are codominant and patent along their entire visualized course. A short portion of the V3 segments is not visualized due to field of view constraints. IMPRESSION: 1. 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 2. Normal intracranial MRA. 3. Mild stenosis of the left carotid bifurcation but otherwise no hemodynamically significant stenosis of the internal carotid or vertebral arteries. 4. Loss of enhancement within the distal left subclavian artery could indicate high-grade stenosis or occlusion. Electronically Signed   By: KUlyses JarredM.D.   On: 07/01/2019 22:36   MR BRAIN W WO CONTRAST  Result Date: 07/01/2019 CLINICAL DATA:  Headache EXAM: MRI HEAD WITHOUT AND WITH CONTRAST MRA HEAD WITHOUT  CONTRAST MRA NECK WITHOUT AND WITH CONTRAST TECHNIQUE: Multiplanar, multiecho pulse sequences of the brain and surrounding structures were obtained without and with intravenous contrast. Angiographic images of the Circle of Willis were obtained using MRA technique without intravenous contrast. Angiographic images of the neck were obtained using MRA technique without and with intravenous contrast. Carotid stenosis measurements (when applicable) are obtained utilizing NASCET criteria, using the distal internal carotid diameter as the denominator. CONTRAST:  1101mGADAVIST GADOBUTROL 1 MMOL/ML IV SOLN COMPARISON:  Head CT 07/01/2019 FINDINGS: MRI HEAD FINDINGS Brain: Diffusion abnormality in the right occipital lobe at the site of lesion identified on earlier CT. There are associated magnetic susceptibility effects due to known hemorrhage. There is moderate edema in the right occipital lobe that surround contrast-enhancing lesion, which measures 4.8 x 1.9 cm and extends to the border of the right lateral ventricle occipital horn. There is hemosiderin deposition throughout the lesion. There  are no other contrast-enhancing lesions. There is no midline shift. No hydrocephalus. Vascular: Normal flow voids Skull and upper cervical spine: Normal marrow signal Sinuses/Orbits: Negative. Other: None MRA HEAD FINDINGS POSTERIOR CIRCULATION: --Vertebral arteries: Normal V4 segments. --Posterior inferior cerebellar arteries (PICA): Patent origins from the vertebral arteries. --Anterior inferior cerebellar arteries (AICA): Patent origins from the basilar artery. --Basilar artery: Normal. --Superior cerebellar arteries: Normal. --Posterior cerebral arteries: Normal. Both are predominantly supplied by the posterior communicating arteries (p-comm). ANTERIOR CIRCULATION: --Intracranial internal carotid arteries: Normal. --Anterior cerebral arteries (ACA): Normal. Both A1 segments are present. Patent anterior communicating artery  (a-comm). --Middle cerebral arteries (MCA): Normal. MRA NECK FINDINGS Normal aortic branch pattern. Right subclavian artery visualized portion is normal. There is no appreciable flow related enhancement within the distal left subclavian artery. There is mild stenosis at the left carotid bifurcation with a short segment enhancement defect of the proximal external carotid artery. Both internal carotid arteries are normal. The vertebral arteries are codominant and patent along their entire visualized course. A short portion of the V3 segments is not visualized due to field of view constraints. IMPRESSION: 1. 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 2. Normal intracranial MRA. 3. Mild stenosis of the left carotid bifurcation but otherwise no hemodynamically significant stenosis of the internal carotid or vertebral arteries. 4. Loss of enhancement within the distal left subclavian artery could indicate high-grade stenosis or occlusion. Electronically Signed   By: Ulyses Jarred M.D.   On: 07/01/2019 22:36   DG Chest Port 1 View  Result Date: 07/01/2019 CLINICAL DATA:  Fever and dizziness. EXAM: PORTABLE CHEST 1 VIEW COMPARISON:  February 16, 2007 FINDINGS: Multiple sternal wires are seen. This represents a new finding when compared to the prior study. There is no evidence of acute infiltrate, pleural effusion or pneumothorax. The heart size and mediastinal contours are within normal limits. There is tortuosity of the descending thoracic aorta. Degenerative changes seen throughout the thoracic spine. IMPRESSION: 1. Interval median sternotomy since the prior study dated February 16, 2007. 2. No acute or active cardiopulmonary disease. Electronically Signed   By: Virgina Norfolk M.D.   On: 07/01/2019 17:48    Review of Systems  Constitutional: Negative.   HENT: Negative.   Eyes: Positive for visual disturbance.   Respiratory: Negative.   Cardiovascular: Negative.   Gastrointestinal: Negative.   Endocrine: Negative.   Genitourinary: Negative.   Musculoskeletal: Negative.   Allergic/Immunologic: Negative.   Neurological: Positive for seizures.  Hematological: Negative.   Psychiatric/Behavioral: Negative.    Blood pressure (!) 144/76, pulse 83, temperature 98.4 F (36.9 C), temperature source Axillary, resp. rate 20, height _0  (1.88 m), weight 101.9 kg, SpO2 97 %. Physical Exam  Constitutional: He appears well-developed and well-nourished. No distress.  HENT:  Head: Normocephalic and atraumatic.  Right Ear: External ear normal.  Left Ear: External ear normal.  Nose: Nose normal.  Mouth/Throat: Oropharynx is clear and moist.  Eyes: Pupils are equal, round, and reactive to light. Conjunctivae and EOM are normal.  Cardiovascular: Normal rate and regular rhythm.  Respiratory: Effort normal and breath sounds normal.  GI: Soft.  Musculoskeletal:        General: Normal range of motion.     Cervical back: Normal range of motion and neck supple.  Neurological: He has normal strength and normal reflexes. No cranial nerve deficit or sensory deficit. He exhibits normal muscle tone. He displays no  Babinski's sign on the right side. He displays no Babinski's sign on the left side.  Reflex Scores:      Tricep reflexes are 2+ on the right side and 2+ on the left side.      Bicep reflexes are 2+ on the right side and 2+ on the left side.      Brachioradialis reflexes are 2+ on the right side and 2+ on the left side.      Patellar reflexes are 2+ on the right side and 2+ on the left side.      Achilles reflexes are 2+ on the right side and 2+ on the left side. Left homonymous hemianopsia  Psychiatric: He has a normal mood and affect. His speech is normal and behavior is normal. Judgment and thought content normal.    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 I have recommended resection. He can be discharged home after a preop ct today from my standpoint. We will schedule him asap , most likely this week for resection. Risks including but not limited to bleeding, infection, stroke, coma, death, brain damage, change in vision, no visual improvement, no diagnosis, seizures, and other risks were explained. He understands and wishes to proceed. He should be discharged on decadron 47m bid, and keppra 5071mbid.   KyAshok Pall/14/2021, 1:37 PM

## 2019-07-02 NOTE — Consult Note (Signed)
Reason for Consult:right occipital mass Referring Physician: tydarius, yawn is an 74 y.o. male.  HPI: whom has noticed over a few weeks changes in what he sees. He works in an [employer], industrial/product and mentioned to his wife that he was pulling the wrong parts frequently. His vision on the left had deteriorated and prior to his admission he had scintillations in the left visual field. His wife had been asking him to see a physician and he finally did.   Past Medical History:  Diagnosis Date   Anxiety    ASCVD (arteriosclerotic cardiovascular disease)    03/2004: DES x2 to the RCA; IMI with stent occlusion in 02/2005- suboptimal Plavix compliance   CAD (coronary artery disease)    Carcinoma of prostate (HCC)    Clopidogrel held for biopsy   Constipation due to pain medication    Diabetes mellitus without complication (HCC)    Diabetic neuropathy (HCC)    Hyperlipidemia    Hypertension    Mild depression (HCC)    Morbid obesity ([place])    Myocardial infarction ([place])    Shortness of breath dyspnea    Stroke ([place])    Tobacco abuse    stopped smoking 06/28/09    Past Surgical History:  Procedure Laterality Date   BACK SURGERY     CARDIAC CATHETERIZATION     X 1 stent before having CABG   CORONARY ARTERY BYPASS GRAFT  06/28/2008   X4   ENDARTERECTOMY Right 07/03/2015   Procedure: RIGHT CAROTID ENDARTERECTOMY WITH BOVINE PERICARDIUM PATCH ANGIOPLASTY;  Surgeon: Serafina Mitchell, MD;  Location: [facility] OR;  Service: Vascular;  Laterality: Right;   LUMBAR LAMINECTOMY/DECOMPRESSION MICRODISCECTOMY N/A 09/06/2014   Procedure: LUMBER DECOMPRESSION L3-5;  Surgeon: Melina Schools, MD;  Location: [facility];  Service: Orthopedics;  Laterality: N/A;   LUMBAR SPINE SURGERY  2002   PROSTATE BIOPSY     PROSTATECTOMY  02/2007   VASECTOMY      Family History  Problem Relation Age of Onset   Kidney disease Mother    Stroke Father    Breast cancer Sister        breast progressed into bone    Colon cancer Neg Hx    Prostate cancer Neg Hx    Pancreatic cancer Neg Hx     Social History:  reports that he quit smoking about 11 years ago. His smoking use included cigarettes. He has a 40.00 pack-year smoking history. He quit smokeless tobacco use about 10 years ago. He reports previous alcohol use of about 3.0 standard drinks of alcohol per week. He reports that he does not use drugs.  Allergies:  Allergies  Allergen Reactions   Lisinopril Cough    Medications: I have reviewed the patient's current medications.  Results for orders placed or performed during the hospital encounter of 07/01/19 (from the past 48 hour(s))  Comprehensive metabolic panel     Status: Abnormal   Collection Time: 07/01/19  6:01 PM  Result Value Ref Range   Sodium 133 (L) 135 - 145 mmol/L   Potassium 4.4 3.5 - 5.1 mmol/L   Chloride 98 98 - 111 mmol/L   CO2 25 22 - 32 mmol/L   Glucose, Bld 151 (H) 70 - 99 mg/dL    Comment: Glucose reference range applies only to samples taken after fasting for at least 8 hours.   BUN 13 8 - 23 mg/dL   Creatinine, Ser 0.95 0.61 - 1.24 mg/dL   Calcium 9.0  8.9 - 10.3 mg/dL   Total Protein 7.1 6.5 - 8.1 g/dL   Albumin 4.1 3.5 - 5.0 g/dL   AST 16 15 - 41 U/L   ALT 13 0 - 44 U/L   Alkaline Phosphatase 61 38 - 126 U/L   Total Bilirubin 1.1 0.3 - 1.2 mg/dL   GFR calc non Af Amer >60 >60 mL/min   GFR calc Af Amer >60 >60 mL/min   Anion gap 10 5 - 15    Comment: Performed at [facility] [address] [place], [place] [address]  CBC with Differential     Status: Abnormal   Collection Time: 07/01/19  6:01 PM  Result Value Ref Range   WBC 7.8 4.0 - 10.5 K/uL   RBC 4.86 4.22 - 5.81 MIL/uL   Hemoglobin 14.0 13.0 - 17.0 g/dL   HCT 43.0 39.0 - 52.0 %   MCV 88.5 80.0 - 100.0 fL   MCH 28.8 26.0 - 34.0 pg   MCHC 32.6 30.0 - 36.0 g/dL   RDW 12.4 11.5 - 15.5 %   Platelets 215 150 - 400 K/uL   nRBC 0.0 0.0 - 0.2 %   Neutrophils Relative % 89 %   Neutro Abs 6.9 1.7 -  7.7 K/uL   Lymphocytes Relative 4 %   Lymphs Abs 0.3 (L) 0.7 - 4.0 K/uL   Monocytes Relative 5 %   Monocytes Absolute 0.4 0.1 - 1.0 K/uL   Eosinophils Relative 0 %   Eosinophils Absolute 0.0 0.0 - 0.5 K/uL   Basophils Relative 1 %   Basophils Absolute 0.0 0.0 - 0.1 K/uL   Immature Granulocytes 1 %   Abs Immature Granulocytes 0.05 0.00 - 0.07 K/uL    Comment: Performed at [facility] [address], [place],  [address]  Procalcitonin - Baseline     Status: None   Collection Time: 07/01/19  6:01 PM  Result Value Ref Range   Procalcitonin <0.10 ng/mL    Comment:        Interpretation: PCT (Procalcitonin) <= 0.5 ng/mL: Systemic infection (sepsis) is not likely. Local bacterial infection is possible. (NOTE)       Sepsis PCT Algorithm           Lower Respiratory Tract                                      Infection PCT Algorithm    ----------------------------     ----------------------------         PCT < 0.25 ng/mL                PCT < 0.10 ng/mL         Strongly encourage             Strongly discourage   discontinuation of antibiotics    initiation of antibiotics    ----------------------------     -----------------------------       PCT 0.25 - 0.50 ng/mL            PCT 0.10 - 0.25 ng/mL               OR       >80% decrease in PCT            Discourage initiation of  antibiotics      Encourage discontinuation           of antibiotics    ----------------------------     -----------------------------         PCT >= 0.50 ng/mL              PCT 0.26 - 0.50 ng/mL               AND        <80% decrease in PCT             Encourage initiation of                                             antibiotics       Encourage continuation           of antibiotics    ----------------------------     -----------------------------        PCT >= 0.50 ng/mL                  PCT > 0.50 ng/mL               AND         increase in PCT                   Strongly encourage                                      initiation of antibiotics    Strongly encourage escalation           of antibiotics                                     -----------------------------                                           PCT <= 0.25 ng/mL                                                 OR                                        > 80% decrease in PCT                                     Discontinue / Do not initiate                                             antibiotics Performed at [facility], [place] [address], [place], [place] [address]   POC SARS Coronavirus 2 Ag-ED - Nasal Swab (BD Veritor Kit)  Status: None   Collection Time: 07/01/19  7:22 PM  Result Value Ref Range   SARS Coronavirus 2 Ag NEGATIVE NEGATIVE    Comment: (NOTE) SARS-CoV-2 antigen NOT DETECTED.  Negative results are presumptive.  Negative results do not preclude SARS-CoV-2 infection and should not be used as the sole basis for treatment or other patient management decisions, including infection  control decisions, particularly in the presence of clinical signs and  symptoms consistent with COVID-19, or in those who have been in contact with the virus.  Negative results must be combined with clinical observations, patient history, and epidemiological information. The expected result is Negative. Fact Sheet for Patients: PodPark.tn Fact Sheet for Healthcare Providers: GiftContent.is This test is not yet approved or cleared by the [place] FDA and  has been authorized for detection and/or diagnosis of SARS-CoV-2 by FDA under an Emergency Use Authorization (EUA).  This EUA will remain in effect (meaning this test can be used) for the duration of  the COVID-19 de claration under Section 564(b)(1) of the Act, 21 U.S.C. section 360bbb-3(b)(1), unless the authorization is terminated or revoked sooner.   SARS  CORONAVIRUS 2 (TAT 6-24 HRS) Nasopharyngeal Nasopharyngeal Swab     Status: None   Collection Time: 07/01/19  7:41 PM   Specimen: Nasopharyngeal Swab  Result Value Ref Range   SARS Coronavirus 2 NEGATIVE NEGATIVE    Comment: (NOTE) SARS-CoV-2 target nucleic acids are NOT DETECTED. The SARS-CoV-2 RNA is generally detectable in upper and lower respiratory specimens during the acute phase of infection. Negative results do not preclude SARS-CoV-2 infection, do not rule out co-infections with other pathogens, and should not be used as the sole basis for treatment or other patient management decisions. Negative results must be combined with clinical observations, patient history, and epidemiological information. The expected result is Negative. Fact Sheet for Patients: SugarRoll.be Fact Sheet for Healthcare Providers: https://www.woods-mathews.com/ This test is not yet approved or cleared by the [place] FDA and  has been authorized for detection and/or diagnosis of SARS-CoV-2 by FDA under an Emergency Use Authorization (EUA). This EUA will remain  in effect (meaning this test can be used) for the duration of the COVID-19 declaration under Section 56 4(b)(1) of the Act, 21 U.S.C. section 360bbb-3(b)(1), unless the authorization is terminated or revoked sooner. Performed at [facility], [place] [address] [place], [place] [address]   Urinalysis, Routine w reflex microscopic     Status: Abnormal   Collection Time: 07/02/19 12:29 PM  Result Value Ref Range   Color, Urine YELLOW YELLOW   APPearance CLEAR CLEAR   Specific Gravity, Urine 1.029 1.005 - 1.030   pH 5.0 5.0 - 8.0   Glucose, UA 50 (A) NEGATIVE mg/dL   Hgb urine dipstick NEGATIVE NEGATIVE   Bilirubin Urine NEGATIVE NEGATIVE   Ketones, ur NEGATIVE NEGATIVE mg/dL   Protein, ur 30 (A) NEGATIVE mg/dL   Nitrite NEGATIVE NEGATIVE   Leukocytes,Ua NEGATIVE NEGATIVE   RBC / HPF 0-5 0 -  5 RBC/hpf   WBC, UA 0-5 0 - 5 WBC/hpf   Bacteria, UA NONE SEEN NONE SEEN   Squamous Epithelial / LPF 0-5 0 - 5    Comment: Performed at [facility], [place] [address], [place],  [address]    CT Head Wo Contrast  Result Date: 07/01/2019 CLINICAL DATA:  Dizziness, weakness, visual disturbances EXAM: CT HEAD WITHOUT CONTRAST TECHNIQUE: Contiguous axial images were obtained from the base of the skull through the vertex  without intravenous contrast. COMPARISON:  06/18/2015 FINDINGS: Brain: There is a hypodense lesion of the right occipital lobe with minimal associated subcortical hemorrhage (series 3, image 18). Periventricular white matter hypodensity. Vascular: No hyperdense vessel or unexpected calcification. Skull: Normal. Negative for fracture or focal lesion. Sinuses/Orbits: No acute finding. Other: None. IMPRESSION: Hypodense lesion of the right occipital lobe with minimal associated subcortical hemorrhage, most consistent with acute to subacute infarction and minimal hemorrhagic transformation. No significant mass effect. Mass or metastatic lesion is a differential consideration. Consider contrast enhanced MRI to further evaluate. Electronically Signed   By: Eddie Candle M.D.   On: 07/01/2019 18:24   MR ANGIO HEAD WO CONTRAST  Result Date: 07/01/2019 CLINICAL DATA:  Headache EXAM: MRI HEAD WITHOUT AND WITH CONTRAST MRA HEAD WITHOUT CONTRAST MRA NECK WITHOUT AND WITH CONTRAST TECHNIQUE: Multiplanar, multiecho pulse sequences of the brain and surrounding structures were obtained without and with intravenous contrast. Angiographic images of the Circle of Willis were obtained using MRA technique without intravenous contrast. Angiographic images of the neck were obtained using MRA technique without and with intravenous contrast. Carotid stenosis measurements (when applicable) are obtained utilizing NASCET criteria, using the distal internal carotid diameter as the denominator. CONTRAST:  81m  GADAVIST GADOBUTROL 1 MMOL/ML IV SOLN COMPARISON:  Head CT 07/01/2019 FINDINGS: MRI HEAD FINDINGS Brain: Diffusion abnormality in the right occipital lobe at the site of lesion identified on earlier CT. There are associated magnetic susceptibility effects due to known hemorrhage. There is moderate edema in the right occipital lobe that surround contrast-enhancing lesion, which measures 4.8 x 1.9 cm and extends to the border of the right lateral ventricle occipital horn. There is hemosiderin deposition throughout the lesion. There are no other contrast-enhancing lesions. There is no midline shift. No hydrocephalus. Vascular: Normal flow voids Skull and upper cervical spine: Normal marrow signal Sinuses/Orbits: Negative. Other: None MRA HEAD FINDINGS POSTERIOR CIRCULATION: --Vertebral arteries: Normal V4 segments. --Posterior inferior cerebellar arteries (PICA): Patent origins from the vertebral arteries. --Anterior inferior cerebellar arteries (AICA): Patent origins from the basilar artery. --Basilar artery: Normal. --Superior cerebellar arteries: Normal. --Posterior cerebral arteries: Normal. Both are predominantly supplied by the posterior communicating arteries (p-comm). ANTERIOR CIRCULATION: --Intracranial internal carotid arteries: Normal. --Anterior cerebral arteries (ACA): Normal. Both A1 segments are present. Patent anterior communicating artery (a-comm). --Middle cerebral arteries (MCA): Normal. MRA NECK FINDINGS Normal aortic branch pattern. Right subclavian artery visualized portion is normal. There is no appreciable flow related enhancement within the distal left subclavian artery. There is mild stenosis at the left carotid bifurcation with a short segment enhancement defect of the proximal external carotid artery. Both internal carotid arteries are normal. The vertebral arteries are codominant and patent along their entire visualized course. A short portion of the V3 segments is not visualized due to  field of view constraints. IMPRESSION: 1. 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 2. Normal intracranial MRA. 3. Mild stenosis of the left carotid bifurcation but otherwise no hemodynamically significant stenosis of the internal carotid or vertebral arteries. 4. Loss of enhancement within the distal left subclavian artery could indicate high-grade stenosis or occlusion. Electronically Signed   By: KUlyses JarredM.D.   On: 07/01/2019 22:36   MR Angiogram Neck W or Wo Contrast  Result Date: 07/01/2019 CLINICAL DATA:  Headache EXAM: MRI HEAD WITHOUT AND WITH CONTRAST MRA HEAD WITHOUT CONTRAST MRA NECK WITHOUT AND WITH CONTRAST TECHNIQUE: Multiplanar, multiecho pulse  sequences of the brain and surrounding structures were obtained without and with intravenous contrast. Angiographic images of the Circle of Willis were obtained using MRA technique without intravenous contrast. Angiographic images of the neck were obtained using MRA technique without and with intravenous contrast. Carotid stenosis measurements (when applicable) are obtained utilizing NASCET criteria, using the distal internal carotid diameter as the denominator. CONTRAST:  60m GADAVIST GADOBUTROL 1 MMOL/ML IV SOLN COMPARISON:  Head CT 07/01/2019 FINDINGS: MRI HEAD FINDINGS Brain: Diffusion abnormality in the right occipital lobe at the site of lesion identified on earlier CT. There are associated magnetic susceptibility effects due to known hemorrhage. There is moderate edema in the right occipital lobe that surround contrast-enhancing lesion, which measures 4.8 x 1.9 cm and extends to the border of the right lateral ventricle occipital horn. There is hemosiderin deposition throughout the lesion. There are no other contrast-enhancing lesions. There is no midline shift. No hydrocephalus. Vascular: Normal flow voids Skull and upper cervical spine:  Normal marrow signal Sinuses/Orbits: Negative. Other: None MRA HEAD FINDINGS POSTERIOR CIRCULATION: --Vertebral arteries: Normal V4 segments. --Posterior inferior cerebellar arteries (PICA): Patent origins from the vertebral arteries. --Anterior inferior cerebellar arteries (AICA): Patent origins from the basilar artery. --Basilar artery: Normal. --Superior cerebellar arteries: Normal. --Posterior cerebral arteries: Normal. Both are predominantly supplied by the posterior communicating arteries (p-comm). ANTERIOR CIRCULATION: --Intracranial internal carotid arteries: Normal. --Anterior cerebral arteries (ACA): Normal. Both A1 segments are present. Patent anterior communicating artery (a-comm). --Middle cerebral arteries (MCA): Normal. MRA NECK FINDINGS Normal aortic branch pattern. Right subclavian artery visualized portion is normal. There is no appreciable flow related enhancement within the distal left subclavian artery. There is mild stenosis at the left carotid bifurcation with a short segment enhancement defect of the proximal external carotid artery. Both internal carotid arteries are normal. The vertebral arteries are codominant and patent along their entire visualized course. A short portion of the V3 segments is not visualized due to field of view constraints. IMPRESSION: 1. 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 2. Normal intracranial MRA. 3. Mild stenosis of the left carotid bifurcation but otherwise no hemodynamically significant stenosis of the internal carotid or vertebral arteries. 4. Loss of enhancement within the distal left subclavian artery could indicate high-grade stenosis or occlusion. Electronically Signed   By: KUlyses JarredM.D.   On: 07/01/2019 22:36   MR BRAIN W WO CONTRAST  Result Date: 07/01/2019 CLINICAL DATA:  Headache EXAM: MRI HEAD WITHOUT AND WITH CONTRAST MRA HEAD WITHOUT  CONTRAST MRA NECK WITHOUT AND WITH CONTRAST TECHNIQUE: Multiplanar, multiecho pulse sequences of the brain and surrounding structures were obtained without and with intravenous contrast. Angiographic images of the Circle of Willis were obtained using MRA technique without intravenous contrast. Angiographic images of the neck were obtained using MRA technique without and with intravenous contrast. Carotid stenosis measurements (when applicable) are obtained utilizing NASCET criteria, using the distal internal carotid diameter as the denominator. CONTRAST:  1101mGADAVIST GADOBUTROL 1 MMOL/ML IV SOLN COMPARISON:  Head CT 07/01/2019 FINDINGS: MRI HEAD FINDINGS Brain: Diffusion abnormality in the right occipital lobe at the site of lesion identified on earlier CT. There are associated magnetic susceptibility effects due to known hemorrhage. There is moderate edema in the right occipital lobe that surround contrast-enhancing lesion, which measures 4.8 x 1.9 cm and extends to the border of the right lateral ventricle occipital horn. There is hemosiderin deposition throughout the lesion. There  are no other contrast-enhancing lesions. There is no midline shift. No hydrocephalus. Vascular: Normal flow voids Skull and upper cervical spine: Normal marrow signal Sinuses/Orbits: Negative. Other: None MRA HEAD FINDINGS POSTERIOR CIRCULATION: --Vertebral arteries: Normal V4 segments. --Posterior inferior cerebellar arteries (PICA): Patent origins from the vertebral arteries. --Anterior inferior cerebellar arteries (AICA): Patent origins from the basilar artery. --Basilar artery: Normal. --Superior cerebellar arteries: Normal. --Posterior cerebral arteries: Normal. Both are predominantly supplied by the posterior communicating arteries (p-comm). ANTERIOR CIRCULATION: --Intracranial internal carotid arteries: Normal. --Anterior cerebral arteries (ACA): Normal. Both A1 segments are present. Patent anterior communicating artery  (a-comm). --Middle cerebral arteries (MCA): Normal. MRA NECK FINDINGS Normal aortic branch pattern. Right subclavian artery visualized portion is normal. There is no appreciable flow related enhancement within the distal left subclavian artery. There is mild stenosis at the left carotid bifurcation with a short segment enhancement defect of the proximal external carotid artery. Both internal carotid arteries are normal. The vertebral arteries are codominant and patent along their entire visualized course. A short portion of the V3 segments is not visualized due to field of view constraints. IMPRESSION: 1. 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 2. Normal intracranial MRA. 3. Mild stenosis of the left carotid bifurcation but otherwise no hemodynamically significant stenosis of the internal carotid or vertebral arteries. 4. Loss of enhancement within the distal left subclavian artery could indicate high-grade stenosis or occlusion. Electronically Signed   By: Ulyses Jarred M.D.   On: 07/01/2019 22:36   DG Chest Port 1 View  Result Date: 07/01/2019 CLINICAL DATA:  Fever and dizziness. EXAM: PORTABLE CHEST 1 VIEW COMPARISON:  February 16, 2007 FINDINGS: Multiple sternal wires are seen. This represents a new finding when compared to the prior study. There is no evidence of acute infiltrate, pleural effusion or pneumothorax. The heart size and mediastinal contours are within normal limits. There is tortuosity of the descending thoracic aorta. Degenerative changes seen throughout the thoracic spine. IMPRESSION: 1. Interval median sternotomy since the prior study dated February 16, 2007. 2. No acute or active cardiopulmonary disease. Electronically Signed   By: Virgina Norfolk M.D.   On: 07/01/2019 17:48    Review of Systems  Constitutional: Negative.   HENT: Negative.   Eyes: Positive for visual disturbance.   Respiratory: Negative.   Cardiovascular: Negative.   Gastrointestinal: Negative.   Endocrine: Negative.   Genitourinary: Negative.   Musculoskeletal: Negative.   Allergic/Immunologic: Negative.   Neurological: Positive for seizures.  Hematological: Negative.   Psychiatric/Behavioral: Negative.    Blood pressure (!) 144/76, pulse 83, temperature 98.4 F (36.9 C), temperature source Axillary, resp. rate 20, height _0  (1.88 m), weight 101.9 kg, SpO2 97 %. Physical Exam  Constitutional: He appears well-developed and well-nourished. No distress.  HENT:  Head: Normocephalic and atraumatic.  Right Ear: External ear normal.  Left Ear: External ear normal.  Nose: Nose normal.  Mouth/Throat: Oropharynx is clear and moist.  Eyes: Pupils are equal, round, and reactive to light. Conjunctivae and EOM are normal.  Cardiovascular: Normal rate and regular rhythm.  Respiratory: Effort normal and breath sounds normal.  GI: Soft.  Musculoskeletal:        General: Normal range of motion.     Cervical back: Normal range of motion and neck supple.  Neurological: He has normal strength and normal reflexes. No cranial nerve deficit or sensory deficit. He exhibits normal muscle tone. He displays no  Babinski's sign on the right side. He displays no Babinski's sign on the left side.  Reflex Scores:      Tricep reflexes are 2+ on the right side and 2+ on the left side.      Bicep reflexes are 2+ on the right side and 2+ on the left side.      Brachioradialis reflexes are 2+ on the right side and 2+ on the left side.      Patellar reflexes are 2+ on the right side and 2+ on the left side.      Achilles reflexes are 2+ on the right side and 2+ on the left side. Left homonymous hemianopsia  Psychiatric: He has a normal mood and affect. His speech is normal and behavior is normal. Judgment and thought content normal.    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 I have recommended resection. He can be discharged home after a preop ct today from my standpoint. We will schedule him asap , most likely this week for resection. Risks including but not limited to bleeding, infection, stroke, coma, death, brain damage, change in vision, no visual improvement, no diagnosis, seizures, and other risks were explained. He understands and wishes to proceed. He should be discharged on decadron 47m bid, and keppra 5071mbid.   KyAshok Pall/14/2021, 1:37 PM

## 2019-07-02 NOTE — Progress Notes (Signed)
PROGRESS NOTE    [name]  [id number] DOB: 1946-02-03 DOA: 07/01/2019 PCP: Maurice Small, MD     Brief Narrative:  [name] is a 74 y.o. male with medical history significant for prostate cancer status post radiation, diabetes mellitus, coronary artery disease, and hypertension, now presenting to the ED for evaluation of headaches, fevers, nausea, and vomiting.  Patient reports that the symptoms began 2 to 3 days ago, have been persistent, and also accompanied by transient vision disturbance that he described as though he was looking through a prism, seeing different colors and multiple images.  The vision deficits seems to have resolved but the headache, fevers, and nausea persist.  He has not noted any focal numbness or weakness. Work up in the ED showed head CT concerning for right occipital lesion with minimal associated hemorrhage. Neurology was consulted by the ED. 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 Patient admitted for further work up.   New events last 24 hours / Subjective: States that his headache has resolved, continues to have left sided visual field deficit.  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 Hold aspirin in case of surgical intervention/biopsy     HTN  -Continue norvasc, losartan-HCTZ, toprol     DVT prophylaxis: SCD Code Status: Full Family Communication: None at bedside Disposition Plan:  . Patient is from home prior to admission. . Currently in-hospital treatment needed due to further work up of brain mass. . Disposition unclear yet.    Consultants:   Neurology  Neurosurgery  Procedures:   None   Antimicrobials:  Anti-infectives (From admission, onward)   None        Objective: Vitals:   07/01/19 2333 07/02/19 0009 07/02/19 0409 07/02/19 0815  BP:   (!) 154/88 139/83  Pulse:  91 91 84  Resp:  20 17 20   Temp:   99.1 F (37.3 C) 100.2 F (37.9 C)  TempSrc:   Oral Oral  SpO2: 94% 98% 96% 96%  Weight:      Height:        Intake/Output Summary (Last 24 hours) at 07/02/2019 1059 Last data filed at 07/02/2019 0300 Gross per 24 hour  Intake 346.16 ml  Output --  Net 346.16 ml   Filed Weights   07/01/19 1710 07/01/19 2212  Weight: 107.5 kg 101.9 kg    Examination:  General exam: Appears calm and comfortable  Respiratory system: Clear to auscultation. Respiratory effort normal. No respiratory distress. No conversational dyspnea.  Cardiovascular system: S1 & S2 heard, RRR. No murmurs. No pedal edema. Gastrointestinal system: Abdomen is nondistended, soft and nontender. Normal bowel sounds heard. Central nervous system: Alert and oriented. +Left hemianopsia, EOMI, no facial droop, strength all extremities equal, no speech deficit   Extremities: Symmetric in appearance  Skin: No rashes, lesions or ulcers on exposed skin  Psychiatry: Judgement and insight appear  normal. Mood & affect appropriate.   Data Reviewed: I have personally reviewed following labs and imaging studies  CBC: Recent Labs  Lab 07/01/19 1801  WBC 7.8  NEUTROABS 6.9  HGB 14.0  HCT 43.0  MCV 88.5  PLT 123456   Basic Metabolic Panel: Recent Labs  Lab 07/01/19 1801  NA 133*  K 4.4  CL 98  CO2 25  GLUCOSE 151*  BUN 13   CREATININE 0.95  CALCIUM 9.0   GFR: Estimated Creatinine Clearance: 88.3 mL/min (by C-G formula based on SCr of 0.95 mg/dL). Liver Function Tests: Recent Labs  Lab 07/01/19 1801  AST 16  ALT 13  ALKPHOS 61  BILITOT 1.1  PROT 7.1  ALBUMIN 4.1   No results for input(s): LIPASE, AMYLASE in the last 168 hours. No results for input(s): AMMONIA in the last 168 hours. Coagulation Profile: No results for input(s): INR, PROTIME in the last 168 hours. Cardiac Enzymes: No results for input(s): CKTOTAL, CKMB, CKMBINDEX, TROPONINI in the last 168 hours. BNP (last 3 results) No results for input(s): PROBNP in the last 8760 hours. HbA1C: No results for input(s): HGBA1C in the last 72 hours. CBG: No results for input(s): GLUCAP in the last 168 hours. Lipid Profile: No results for input(s): CHOL, HDL, LDLCALC, TRIG, CHOLHDL, LDLDIRECT in the last 72 hours. Thyroid Function Tests: No results for input(s): TSH, T4TOTAL, FREET4, T3FREE, THYROIDAB in the last 72 hours. Anemia Panel: No results for input(s): VITAMINB12, FOLATE, FERRITIN, TIBC, IRON, RETICCTPCT in the last 72 hours. Sepsis Labs: Recent Labs  Lab 07/01/19 1801  PROCALCITON <0.10    Recent Results (from the past 240 hour(s))  SARS CORONAVIRUS 2 (TAT 6-24 HRS) Nasopharyngeal Nasopharyngeal Swab     Status: None   Collection Time: 07/01/19  7:41 PM   Specimen: Nasopharyngeal Swab  Result Value Ref Range Status   SARS Coronavirus 2 NEGATIVE NEGATIVE Final    Comment: (NOTE) SARS-CoV-2 target nucleic acids are NOT DETECTED. The SARS-CoV-2 RNA is generally detectable in upper and lower respiratory specimens during the acute phase of infection. Negative results do not preclude SARS-CoV-2 infection, do not rule out co-infections with other pathogens, and should not be used as the sole basis for treatment or other patient management decisions. Negative results must be combined with clinical observations, patient history, and  epidemiological information. The expected result is Negative. Fact Sheet for Patients: SugarRoll.be Fact Sheet for Healthcare Providers: https://www.woods-mathews.com/ This test is not yet approved or cleared by the [place] FDA and  has been authorized for detection and/or diagnosis of SARS-CoV-2 by FDA under an Emergency Use Authorization (EUA). This EUA will remain  in effect (meaning this test can be used) for the duration of the COVID-19 declaration under Section 56 4(b)(1) of the Act, 21 U.S.C. section 360bbb-3(b)(1), unless the authorization is terminated or revoked sooner. Performed at [facility], [place] [address], [place], [place] [address]       Radiology Studies: CT Head Wo Contrast  Result Date: 07/01/2019 CLINICAL DATA:  Dizziness, weakness, visual disturbances EXAM: CT HEAD WITHOUT CONTRAST TECHNIQUE: Contiguous axial images were obtained from the base of the skull through the vertex without intravenous contrast. COMPARISON:  06/18/2015 FINDINGS: Brain: There is a hypodense lesion of the right occipital lobe with minimal associated subcortical hemorrhage (series 3, image 18). Periventricular white matter hypodensity. Vascular: No hyperdense vessel or unexpected calcification. Skull: Normal. Negative for fracture or focal lesion. Sinuses/Orbits: No acute finding. Other: None. IMPRESSION: Hypodense lesion of  the right occipital lobe with minimal associated subcortical hemorrhage, most consistent with acute to subacute infarction and minimal hemorrhagic transformation. No significant mass effect. Mass or metastatic lesion is a differential consideration. Consider contrast enhanced MRI to further evaluate. Electronically Signed   By: Eddie Candle M.D.   On: 07/01/2019 18:24   MR ANGIO HEAD WO CONTRAST  Result Date: 07/01/2019 CLINICAL DATA:  Headache EXAM: MRI HEAD WITHOUT AND WITH CONTRAST MRA HEAD WITHOUT CONTRAST MRA NECK  WITHOUT AND WITH CONTRAST TECHNIQUE: Multiplanar, multiecho pulse sequences of the brain and surrounding structures were obtained without and with intravenous contrast. Angiographic images of the Circle of Willis were obtained using MRA technique without intravenous contrast. Angiographic images of the neck were obtained using MRA technique without and with intravenous contrast. Carotid stenosis measurements (when applicable) are obtained utilizing NASCET criteria, using the distal internal carotid diameter as the denominator. CONTRAST:  38mL GADAVIST GADOBUTROL 1 MMOL/ML IV SOLN COMPARISON:  Head CT 07/01/2019 FINDINGS: MRI HEAD FINDINGS Brain: Diffusion abnormality in the right occipital lobe at the site of lesion identified on earlier CT. There are associated magnetic susceptibility effects due to known hemorrhage. There is moderate edema in the right occipital lobe that surround contrast-enhancing lesion, which measures 4.8 x 1.9 cm and extends to the border of the right lateral ventricle occipital horn. There is hemosiderin deposition throughout the lesion. There are no other contrast-enhancing lesions. There is no midline shift. No hydrocephalus. Vascular: Normal flow voids Skull and upper cervical spine: Normal marrow signal Sinuses/Orbits: Negative. Other: None MRA HEAD FINDINGS POSTERIOR CIRCULATION: --Vertebral arteries: Normal V4 segments. --Posterior inferior cerebellar arteries (PICA): Patent origins from the vertebral arteries. --Anterior inferior cerebellar arteries (AICA): Patent origins from the basilar artery. --Basilar artery: Normal. --Superior cerebellar arteries: Normal. --Posterior cerebral arteries: Normal. Both are predominantly supplied by the posterior communicating arteries (p-comm). ANTERIOR CIRCULATION: --Intracranial internal carotid arteries: Normal. --Anterior cerebral arteries (ACA): Normal. Both A1 segments are present. Patent anterior communicating artery (a-comm). --Middle  cerebral arteries (MCA): Normal. MRA NECK FINDINGS Normal aortic branch pattern. Right subclavian artery visualized portion is normal. There is no appreciable flow related enhancement within the distal left subclavian artery. There is mild stenosis at the left carotid bifurcation with a short segment enhancement defect of the proximal external carotid artery. Both internal carotid arteries are normal. The vertebral arteries are codominant and patent along their entire visualized course. A short portion of the V3 segments is not visualized due to field of view constraints. IMPRESSION: 1. 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 2. Normal intracranial MRA. 3. Mild stenosis of the left carotid bifurcation but otherwise no hemodynamically significant stenosis of the internal carotid or vertebral arteries. 4. Loss of enhancement within the distal left subclavian artery could indicate high-grade stenosis or occlusion. Electronically Signed   By: Ulyses Jarred M.D.   On: 07/01/2019 22:36   MR Angiogram Neck W or Wo Contrast  Result Date: 07/01/2019 CLINICAL DATA:  Headache EXAM: MRI HEAD WITHOUT AND WITH CONTRAST MRA HEAD WITHOUT CONTRAST MRA NECK WITHOUT AND WITH CONTRAST TECHNIQUE: Multiplanar, multiecho pulse sequences of the brain and surrounding structures were obtained without and with intravenous contrast. Angiographic images of the Circle of Willis were obtained using MRA technique without intravenous contrast. Angiographic images of the neck were obtained using MRA technique without and with intravenous contrast. Carotid stenosis measurements (when applicable) are obtained utilizing NASCET criteria, using the  distal internal carotid diameter as the denominator. CONTRAST:  47mL GADAVIST GADOBUTROL 1 MMOL/ML IV SOLN COMPARISON:  Head CT 07/01/2019 FINDINGS: MRI HEAD FINDINGS Brain: Diffusion abnormality in  the right occipital lobe at the site of lesion identified on earlier CT. There are associated magnetic susceptibility effects due to known hemorrhage. There is moderate edema in the right occipital lobe that surround contrast-enhancing lesion, which measures 4.8 x 1.9 cm and extends to the border of the right lateral ventricle occipital horn. There is hemosiderin deposition throughout the lesion. There are no other contrast-enhancing lesions. There is no midline shift. No hydrocephalus. Vascular: Normal flow voids Skull and upper cervical spine: Normal marrow signal Sinuses/Orbits: Negative. Other: None MRA HEAD FINDINGS POSTERIOR CIRCULATION: --Vertebral arteries: Normal V4 segments. --Posterior inferior cerebellar arteries (PICA): Patent origins from the vertebral arteries. --Anterior inferior cerebellar arteries (AICA): Patent origins from the basilar artery. --Basilar artery: Normal. --Superior cerebellar arteries: Normal. --Posterior cerebral arteries: Normal. Both are predominantly supplied by the posterior communicating arteries (p-comm). ANTERIOR CIRCULATION: --Intracranial internal carotid arteries: Normal. --Anterior cerebral arteries (ACA): Normal. Both A1 segments are present. Patent anterior communicating artery (a-comm). --Middle cerebral arteries (MCA): Normal. MRA NECK FINDINGS Normal aortic branch pattern. Right subclavian artery visualized portion is normal. There is no appreciable flow related enhancement within the distal left subclavian artery. There is mild stenosis at the left carotid bifurcation with a short segment enhancement defect of the proximal external carotid artery. Both internal carotid arteries are normal. The vertebral arteries are codominant and patent along their entire visualized course. A short portion of the V3 segments is not visualized due to field of view constraints. IMPRESSION: 1. 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 2. Normal intracranial MRA. 3. Mild stenosis of the left carotid bifurcation but otherwise no hemodynamically significant stenosis of the internal carotid or vertebral arteries. 4. Loss of enhancement within the distal left subclavian artery could indicate high-grade stenosis or occlusion. Electronically Signed   By: Ulyses Jarred M.D.   On: 07/01/2019 22:36   MR BRAIN W WO CONTRAST  Result Date: 07/01/2019 CLINICAL DATA:  Headache EXAM: MRI HEAD WITHOUT AND WITH CONTRAST MRA HEAD WITHOUT CONTRAST MRA NECK WITHOUT AND WITH CONTRAST TECHNIQUE: Multiplanar, multiecho pulse sequences of the brain and surrounding structures were obtained without and with intravenous contrast. Angiographic images of the Circle of Willis were obtained using MRA technique without intravenous contrast. Angiographic images of the neck were obtained using MRA technique without and with intravenous contrast. Carotid stenosis measurements (when applicable) are obtained utilizing NASCET criteria, using the distal internal carotid diameter as the denominator. CONTRAST:  55mL GADAVIST GADOBUTROL 1 MMOL/ML IV SOLN COMPARISON:  Head CT 07/01/2019 FINDINGS: MRI HEAD FINDINGS Brain: Diffusion abnormality in the right occipital lobe at the site of lesion identified on earlier CT. There are associated magnetic susceptibility effects due to known hemorrhage. There is moderate edema in the right occipital lobe that surround contrast-enhancing lesion, which measures 4.8 x 1.9 cm and extends to the border of the right lateral ventricle occipital horn. There is hemosiderin deposition throughout the lesion. There are no other contrast-enhancing lesions. There is no midline shift. No hydrocephalus. Vascular: Normal flow voids Skull and upper cervical spine: Normal marrow signal Sinuses/Orbits: Negative. Other: None MRA HEAD FINDINGS POSTERIOR CIRCULATION: --Vertebral arteries: Normal V4 segments.  --Posterior inferior cerebellar arteries (PICA): Patent origins from the vertebral arteries. --Anterior inferior cerebellar arteries (AICA): Patent  origins from the basilar artery. --Basilar artery: Normal. --Superior cerebellar arteries: Normal. --Posterior cerebral arteries: Normal. Both are predominantly supplied by the posterior communicating arteries (p-comm). ANTERIOR CIRCULATION: --Intracranial internal carotid arteries: Normal. --Anterior cerebral arteries (ACA): Normal. Both A1 segments are present. Patent anterior communicating artery (a-comm). --Middle cerebral arteries (MCA): Normal. MRA NECK FINDINGS Normal aortic branch pattern. Right subclavian artery visualized portion is normal. There is no appreciable flow related enhancement within the distal left subclavian artery. There is mild stenosis at the left carotid bifurcation with a short segment enhancement defect of the proximal external carotid artery. Both internal carotid arteries are normal. The vertebral arteries are codominant and patent along their entire visualized course. A short portion of the V3 segments is not visualized due to field of view constraints. IMPRESSION: 1. 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 2. Normal intracranial MRA. 3. Mild stenosis of the left carotid bifurcation but otherwise no hemodynamically significant stenosis of the internal carotid or vertebral arteries. 4. Loss of enhancement within the distal left subclavian artery could indicate high-grade stenosis or occlusion. Electronically Signed   By: Ulyses Jarred M.D.   On: 07/01/2019 22:36   DG Chest Port 1 View  Result Date: 07/01/2019 CLINICAL DATA:  Fever and dizziness. EXAM: PORTABLE CHEST 1 VIEW COMPARISON:  February 16, 2007 FINDINGS: Multiple sternal wires are seen. This represents a new finding when compared to the prior study. There is no evidence of  acute infiltrate, pleural effusion or pneumothorax. The heart size and mediastinal contours are within normal limits. There is tortuosity of the descending thoracic aorta. Degenerative changes seen throughout the thoracic spine. IMPRESSION: 1. Interval median sternotomy since the prior study dated February 16, 2007. 2. No acute or active cardiopulmonary disease. Electronically Signed   By: Virgina Norfolk M.D.   On: 07/01/2019 17:48      Scheduled Meds: . dexamethasone  4 mg Oral Q6H   Continuous Infusions:   LOS: 1 day      Time spent: 40 minutes   Dessa Phi, DO [facility] 07/02/2019, 10:59 AM   Available via Epic secure chat 7am-7pm After these hours, please refer to coverage provider listed on [email or web address]

## 2019-07-03 ENCOUNTER — Other Ambulatory Visit: Payer: Self-pay | Admitting: Radiation Therapy

## 2019-07-03 ENCOUNTER — Other Ambulatory Visit: Payer: Self-pay | Admitting: Neurosurgery

## 2019-07-03 ENCOUNTER — Other Ambulatory Visit: Payer: Self-pay

## 2019-07-03 ENCOUNTER — Encounter (HOSPITAL_COMMUNITY): Payer: Self-pay | Admitting: Neurosurgery

## 2019-07-03 LAB — URINE CULTURE

## 2019-07-03 LAB — GLUCOSE, CAPILLARY
Glucose-Capillary: 156 mg/dL — ABNORMAL HIGH (ref 70–99)
Glucose-Capillary: 198 mg/dL — ABNORMAL HIGH (ref 70–99)

## 2019-07-03 MED ORDER — DEXAMETHASONE 4 MG PO TABS: 4.0000 mg | ORAL_TABLET | Freq: Two times a day (BID) | ORAL | Status: DC

## 2019-07-03 MED ORDER — LEVETIRACETAM 500 MG PO TABS: 500.0000 mg | ORAL_TABLET | Freq: Two times a day (BID) | ORAL | 0 refills | Status: DC

## 2019-07-03 MED ORDER — DEXAMETHASONE 4 MG PO TABS: 4.0000 mg | ORAL_TABLET | Freq: Two times a day (BID) | ORAL | 0 refills | Status: DC

## 2019-07-03 MED ORDER — LEVETIRACETAM 500 MG PO TABS
500.0000 mg | ORAL_TABLET | Freq: Two times a day (BID) | ORAL | Status: DC
  Administered 2019-07-03: 500 mg via ORAL
  Filled 2019-07-03: qty 1

## 2019-07-03 MED FILL — levETIRAcetam 500 MG TABS: 500 | 30 days supply | Qty: 60 | Fill #0

## 2019-07-03 MED FILL — DEXAMETHASONE 4 MG TABLET: 4 | 30 days supply | Qty: 60 | Fill #0

## 2019-07-03 NOTE — Progress Notes (Signed)
Patient being discharged home, educated on discharge instructions. Transportation provided by wife, belongings returned to patient

## 2019-07-03 NOTE — Progress Notes (Signed)
D/C instructions provided to patient, denies questions/concerns at this time. Patient transported to front entrance via WC, tol well. 

## 2019-07-03 NOTE — Anesthesia Preprocedure Evaluation (Addendum)
Anesthesia Evaluation  Patient identified by MRN, date of birth, ID band Patient awake    Reviewed: Allergy & Precautions, NPO status , Patient's Chart, lab work & pertinent test results, reviewed documented beta blocker date and time   History of Anesthesia Complications Negative for: history of anesthetic complications  Airway Mallampati: II  TM Distance: >3 FB Neck ROM: Full    Dental no notable dental hx.    Pulmonary sleep apnea , former smoker,    Pulmonary exam normal        Cardiovascular hypertension, Pt. on medications and Pt. on home beta blockers + CAD, + Past MI, + Cardiac Stents, + CABG and + Peripheral Vascular Disease  Normal cardiovascular exam     Neuro/Psych  Headaches, Anxiety Depression Right occipital mass CVA (2017), No Residual Symptoms    GI/Hepatic   Endo/Other  diabetes, Type 2  Renal/GU      Musculoskeletal   Abdominal   Peds  Hematology   Anesthesia Other Findings   Reproductive/Obstetrics                            Anesthesia Physical Anesthesia Plan  ASA: III  Anesthesia Plan: General   Post-op Pain Management:    Induction: Intravenous  PONV Risk Score and Plan: 3 and Treatment may vary due to age or medical condition, Ondansetron, Propofol infusion and Dexamethasone  Airway Management Planned: Oral ETT  Additional Equipment: Arterial line  Intra-op Plan:   Post-operative Plan: Possible Post-op intubation/ventilation  Informed Consent: I have reviewed the patients History and Physical, chart, labs and discussed the procedure including the risks, benefits and alternatives for the proposed anesthesia with the patient or authorized representative who has indicated his/her understanding and acceptance.     Dental advisory given  Plan Discussed with: CRNA  Anesthesia Plan Comments:        Anesthesia Quick Evaluation

## 2019-07-03 NOTE — Discharge Summary (Signed)
Physician Discharge Summary  CORDELRO [name] [id number] DOB: 1945/11/08 DOA: 07/01/2019  PCP: Maurice Small, MD  Admit date: 07/01/2019 Discharge date: 07/03/2019  Admitted From: Home Disposition:  Home  Recommendations for Outpatient Follow-up:  1. Follow up with Dr. Christella Noa, neurosurgery; he will arrange for outpatient surgery 2. Follow up with [name], neuro-oncology   Discharge Condition: Stable CODE STATUS: Full  Diet recommendation: Heart healthy   Brief/Interim Summary: Mark Stolzenburg Burnetteis a 74 y.o.malewith medical history significant forprostate cancer status post radiation, diabetes mellitus, coronary artery disease, and hypertension, now presenting to the ED for evaluation of headaches, fevers, nausea, and vomiting. Patient reports that the symptoms began 2 to 3 days ago, have been persistent, and also accompanied by transient vision disturbance that he described as though he was looking through a prism, seeing different colors and multiple images. The vision deficits seems to have resolved but the headache, fevers, and nausea persist. He has not noted any focal numbness or weakness. Work up in the ED showed head CT concerning for right occipital lesion with minimal associated hemorrhage.Neurology was consulted by the ED. 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 Patient admitted for further work up. Neurosurgery was consulted. Dr. Christella Noa evaluated patient and has recommended resection; he will arrange this week as outpatient.   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 Hold aspirin due to planned surgical procedure   HTN  -Continue norvasc, losartan-HCTZ, toprol     Discharge Instructions  Discharge Instructions    Call MD for:  difficulty breathing, headache or visual disturbances   Complete by: As directed    Call MD for:  extreme fatigue   Complete by: As directed    Call MD for:  persistant dizziness or light-headedness   Complete by: As directed    Call MD for:  persistant nausea and vomiting   Complete by: As directed    Call MD for:  severe uncontrolled pain   Complete by: As directed    Call MD for:  temperature >100.4   Complete by: As directed    Diet - low sodium heart healthy   Complete by: As directed    Discharge instructions   Complete by: As directed    You were cared for by a hospitalist during your hospital stay. If you have any questions about your discharge medications or the care you received while you were in the hospital after you are discharged, you can call the unit and ask to speak with the hospitalist on call if the hospitalist that took care of you is not available. Once you are discharged, your primary care physician will handle any further medical issues. Please note that NO REFILLS for any discharge medications will be authorized once you are discharged, as it is imperative that you  return to your primary care physician (or establish a relationship with a primary care physician if you do not have one) for your aftercare needs so that they can reassess your need for medications and monitor your lab values.   Increase  activity slowly   Complete by: As directed      Allergies as of 07/03/2019      Reactions   Lisinopril Cough      Medication List    STOP taking these medications   Aleve 220 MG tablet Generic drug: naproxen sodium   aspirin EC 81 MG tablet     TAKE these medications   amLODipine 10 MG tablet Commonly known as: NORVASC TAKE 1 TABLET BY MOUTH DAILY   atorvastatin 80 MG tablet Commonly known as: LIPITOR TAKE 1 TABLET BY MOUTH DAILY AT 6 PM   baclofen 20 MG tablet Commonly known as: LIORESAL Take 20 mg by mouth 2 (two) times daily.   cilostazol 50 MG tablet Commonly known as: PLETAL Take 1 tablet (50 mg total) by mouth 2 (two) times daily.   dexamethasone 4 MG tablet Commonly known as: DECADRON Take 1 tablet (4 mg total) by mouth 2 (two) times daily with a meal.   DULCOLAX PO Take 1 tablet by mouth daily as needed (constipation).   levETIRAcetam 500 MG tablet Commonly known as: KEPPRA Take 1 tablet (500 mg total) by mouth 2 (two) times daily.   losartan-hydrochlorothiazide 100-12.5 MG tablet Commonly known as: HYZAAR Take 1 tablet by mouth daily.   metoprolol succinate 25 MG 24 hr tablet Commonly known as: TOPROL-XL TAKE 1 TABLET BY MOUTH DAILY What changed:   how much to take  how to take this  when to take this  additional instructions   Nexlizet 180-10 MG Tabs Generic drug: Bempedoic Acid-Ezetimibe Take 1 tablet by mouth daily.      Follow-up Information    Ashok Pall, MD Follow up.   Specialty: Neurosurgery Why: Dr. Lacy Duverney office will arrange for surgical resection of brain mass. Please call his office if you do not hear from him.  Contact information: [address] [place] [address] [phone]        Ventura Sellers, MD Follow up.   Specialties: Psychiatry, Neurology, Oncology Why: I've sent a message to [name] to arrange for an outpatient follow up. He is a Engineer, mining  information: [address] [place] [address] [phone]          Allergies  Allergen Reactions  . Lisinopril Cough    Consultations:  Neurology  Neurosurgery    Procedures/Studies: CT Head Wo Contrast  Result Date: 07/01/2019 CLINICAL DATA:  Dizziness, weakness, visual disturbances EXAM: CT HEAD WITHOUT CONTRAST TECHNIQUE: Contiguous axial images were obtained from the base of the skull through the vertex without intravenous contrast. COMPARISON:  06/18/2015 FINDINGS: Brain: There is a hypodense lesion of the right occipital lobe with minimal associated subcortical hemorrhage (series 3, image 18). Periventricular white matter hypodensity. Vascular: No hyperdense vessel or unexpected calcification. Skull: Normal. Negative for fracture or focal lesion. Sinuses/Orbits: No acute finding. Other: None. IMPRESSION: Hypodense lesion of the right occipital lobe with minimal associated subcortical hemorrhage, most consistent with acute to subacute infarction and minimal hemorrhagic transformation. No significant mass effect. Mass or metastatic lesion is a differential consideration. Consider contrast enhanced MRI to further evaluate. Electronically Signed   By: Eddie Candle M.D.   On: 07/01/2019 18:24   Stealth CT Head W  Contrast  Result Date: 07/02/2019 CLINICAL DATA:  Right occipital lobe mass. Surgical planning. EXAM: CT HEAD WITH CONTRAST TECHNIQUE: Contiguous axial images were obtained from the base of the skull through the vertex with intravenous contrast. CONTRAST:  160mL OMNIPAQUE IOHEXOL 300 MG/ML  SOLN COMPARISON:  MR head without and with contrast and CT head without contrast 07/01/2019 FINDINGS: Brain: Peripherally enhancing right occipital mass is again noted. The lesion measures 2.6 x 1.8 x 3.3 cm. Enhancing material extends to the ventricle. No new lesions are present. Surrounding edema is again seen. The ventricles are of normal size. No significant extraaxial fluid  collection is present. Vascular: Atherosclerotic changes are present within the cavernous internal carotid arteries and at the dural margin of the right vertebral artery. Skull: Calvarium is intact. No focal lytic or blastic lesions are present. No significant extracranial soft tissue lesion is present. Sinuses/Orbits: The paranasal sinuses and mastoid air cells are clear. The globes and orbits are within normal limits. IMPRESSION: 1. Stable appearance of peripherally enhancing right occipital mass measuring 2.6 x 1.8 x 3.3 cm. This remains concerning for a primary brain neoplasm. 2. Enhancing material extends to the ventricle. Electronically Signed   By: San Morelle M.D.   On: 07/02/2019 15:57   MR ANGIO HEAD WO CONTRAST  Result Date: 07/01/2019 CLINICAL DATA:  Headache EXAM: MRI HEAD WITHOUT AND WITH CONTRAST MRA HEAD WITHOUT CONTRAST MRA NECK WITHOUT AND WITH CONTRAST TECHNIQUE: Multiplanar, multiecho pulse sequences of the brain and surrounding structures were obtained without and with intravenous contrast. Angiographic images of the Circle of Willis were obtained using MRA technique without intravenous contrast. Angiographic images of the neck were obtained using MRA technique without and with intravenous contrast. Carotid stenosis measurements (when applicable) are obtained utilizing NASCET criteria, using the distal internal carotid diameter as the denominator. CONTRAST:  75mL GADAVIST GADOBUTROL 1 MMOL/ML IV SOLN COMPARISON:  Head CT 07/01/2019 FINDINGS: MRI HEAD FINDINGS Brain: Diffusion abnormality in the right occipital lobe at the site of lesion identified on earlier CT. There are associated magnetic susceptibility effects due to known hemorrhage. There is moderate edema in the right occipital lobe that surround contrast-enhancing lesion, which measures 4.8 x 1.9 cm and extends to the border of the right lateral ventricle occipital horn. There is hemosiderin deposition throughout the lesion.  There are no other contrast-enhancing lesions. There is no midline shift. No hydrocephalus. Vascular: Normal flow voids Skull and upper cervical spine: Normal marrow signal Sinuses/Orbits: Negative. Other: None MRA HEAD FINDINGS POSTERIOR CIRCULATION: --Vertebral arteries: Normal V4 segments. --Posterior inferior cerebellar arteries (PICA): Patent origins from the vertebral arteries. --Anterior inferior cerebellar arteries (AICA): Patent origins from the basilar artery. --Basilar artery: Normal. --Superior cerebellar arteries: Normal. --Posterior cerebral arteries: Normal. Both are predominantly supplied by the posterior communicating arteries (p-comm). ANTERIOR CIRCULATION: --Intracranial internal carotid arteries: Normal. --Anterior cerebral arteries (ACA): Normal. Both A1 segments are present. Patent anterior communicating artery (a-comm). --Middle cerebral arteries (MCA): Normal. MRA NECK FINDINGS Normal aortic branch pattern. Right subclavian artery visualized portion is normal. There is no appreciable flow related enhancement within the distal left subclavian artery. There is mild stenosis at the left carotid bifurcation with a short segment enhancement defect of the proximal external carotid artery. Both internal carotid arteries are normal. The vertebral arteries are codominant and patent along their entire visualized course. A short portion of the V3 segments is not visualized due to field of view constraints. IMPRESSION: 1. 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 2. Normal intracranial MRA. 3. Mild stenosis of the left carotid bifurcation but otherwise no hemodynamically significant stenosis of the internal carotid or vertebral arteries. 4. Loss of enhancement within the distal left subclavian artery could indicate high-grade stenosis or occlusion. Electronically Signed   By: Ulyses Jarred M.D.   On:  07/01/2019 22:36   MR Angiogram Neck W or Wo Contrast  Result Date: 07/01/2019 CLINICAL DATA:  Headache EXAM: MRI HEAD WITHOUT AND WITH CONTRAST MRA HEAD WITHOUT CONTRAST MRA NECK WITHOUT AND WITH CONTRAST TECHNIQUE: Multiplanar, multiecho pulse sequences of the brain and surrounding structures were obtained without and with intravenous contrast. Angiographic images of the Circle of Willis were obtained using MRA technique without intravenous contrast. Angiographic images of the neck were obtained using MRA technique without and with intravenous contrast. Carotid stenosis measurements (when applicable) are obtained utilizing NASCET criteria, using the distal internal carotid diameter as the denominator. CONTRAST:  61mL GADAVIST GADOBUTROL 1 MMOL/ML IV SOLN COMPARISON:  Head CT 07/01/2019 FINDINGS: MRI HEAD FINDINGS Brain: Diffusion abnormality in the right occipital lobe at the site of lesion identified on earlier CT. There are associated magnetic susceptibility effects due to known hemorrhage. There is moderate edema in the right occipital lobe that surround contrast-enhancing lesion, which measures 4.8 x 1.9 cm and extends to the border of the right lateral ventricle occipital horn. There is hemosiderin deposition throughout the lesion. There are no other contrast-enhancing lesions. There is no midline shift. No hydrocephalus. Vascular: Normal flow voids Skull and upper cervical spine: Normal marrow signal Sinuses/Orbits: Negative. Other: None MRA HEAD FINDINGS POSTERIOR CIRCULATION: --Vertebral arteries: Normal V4 segments. --Posterior inferior cerebellar arteries (PICA): Patent origins from the vertebral arteries. --Anterior inferior cerebellar arteries (AICA): Patent origins from the basilar artery. --Basilar artery: Normal. --Superior cerebellar arteries: Normal. --Posterior cerebral arteries: Normal. Both are predominantly supplied by the posterior communicating arteries (p-comm). ANTERIOR CIRCULATION:  --Intracranial internal carotid arteries: Normal. --Anterior cerebral arteries (ACA): Normal. Both A1 segments are present. Patent anterior communicating artery (a-comm). --Middle cerebral arteries (MCA): Normal. MRA NECK FINDINGS Normal aortic branch pattern. Right subclavian artery visualized portion is normal. There is no appreciable flow related enhancement within the distal left subclavian artery. There is mild stenosis at the left carotid bifurcation with a short segment enhancement defect of the proximal external carotid artery. Both internal carotid arteries are normal. The vertebral arteries are codominant and patent along their entire visualized course. A short portion of the V3 segments is not visualized due to field of view constraints. IMPRESSION: 1. 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 2. Normal intracranial MRA. 3. Mild stenosis of the left carotid bifurcation but otherwise no hemodynamically significant stenosis of the internal carotid or vertebral arteries. 4. Loss of enhancement within the distal left subclavian artery could indicate high-grade stenosis or occlusion. Electronically Signed   By: Ulyses Jarred M.D.   On: 07/01/2019 22:36   MR BRAIN W WO CONTRAST  Result Date: 07/01/2019 CLINICAL DATA:  Headache EXAM: MRI HEAD WITHOUT AND WITH CONTRAST MRA HEAD WITHOUT CONTRAST MRA NECK WITHOUT AND WITH CONTRAST TECHNIQUE: Multiplanar, multiecho pulse sequences of the brain and surrounding structures were obtained without and with intravenous contrast. Angiographic images of the Circle of Willis were obtained using MRA technique without intravenous contrast. Angiographic images of the neck were obtained using MRA technique without and with intravenous  contrast. Carotid stenosis measurements (when applicable) are obtained utilizing NASCET criteria, using the distal internal carotid diameter  as the denominator. CONTRAST:  55mL GADAVIST GADOBUTROL 1 MMOL/ML IV SOLN COMPARISON:  Head CT 07/01/2019 FINDINGS: MRI HEAD FINDINGS Brain: Diffusion abnormality in the right occipital lobe at the site of lesion identified on earlier CT. There are associated magnetic susceptibility effects due to known hemorrhage. There is moderate edema in the right occipital lobe that surround contrast-enhancing lesion, which measures 4.8 x 1.9 cm and extends to the border of the right lateral ventricle occipital horn. There is hemosiderin deposition throughout the lesion. There are no other contrast-enhancing lesions. There is no midline shift. No hydrocephalus. Vascular: Normal flow voids Skull and upper cervical spine: Normal marrow signal Sinuses/Orbits: Negative. Other: None MRA HEAD FINDINGS POSTERIOR CIRCULATION: --Vertebral arteries: Normal V4 segments. --Posterior inferior cerebellar arteries (PICA): Patent origins from the vertebral arteries. --Anterior inferior cerebellar arteries (AICA): Patent origins from the basilar artery. --Basilar artery: Normal. --Superior cerebellar arteries: Normal. --Posterior cerebral arteries: Normal. Both are predominantly supplied by the posterior communicating arteries (p-comm). ANTERIOR CIRCULATION: --Intracranial internal carotid arteries: Normal. --Anterior cerebral arteries (ACA): Normal. Both A1 segments are present. Patent anterior communicating artery (a-comm). --Middle cerebral arteries (MCA): Normal. MRA NECK FINDINGS Normal aortic branch pattern. Right subclavian artery visualized portion is normal. There is no appreciable flow related enhancement within the distal left subclavian artery. There is mild stenosis at the left carotid bifurcation with a short segment enhancement defect of the proximal external carotid artery. Both internal carotid arteries are normal. The vertebral arteries are codominant and patent along their entire visualized course. A short portion of the V3  segments is not visualized due to field of view constraints. IMPRESSION: 1. 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 2. Normal intracranial MRA. 3. Mild stenosis of the left carotid bifurcation but otherwise no hemodynamically significant stenosis of the internal carotid or vertebral arteries. 4. Loss of enhancement within the distal left subclavian artery could indicate high-grade stenosis or occlusion. Electronically Signed   By: Ulyses Jarred M.D.   On: 07/01/2019 22:36   DG Chest Port 1 View  Result Date: 07/01/2019 CLINICAL DATA:  Fever and dizziness. EXAM: PORTABLE CHEST 1 VIEW COMPARISON:  February 16, 2007 FINDINGS: Multiple sternal wires are seen. This represents a new finding when compared to the prior study. There is no evidence of acute infiltrate, pleural effusion or pneumothorax. The heart size and mediastinal contours are within normal limits. There is tortuosity of the descending thoracic aorta. Degenerative changes seen throughout the thoracic spine. IMPRESSION: 1. Interval median sternotomy since the prior study dated February 16, 2007. 2. No acute or active cardiopulmonary disease. Electronically Signed   By: Virgina Norfolk M.D.   On: 07/01/2019 17:48       Discharge Exam: Vitals:   07/03/19 0327 07/03/19 0903  BP: 123/73 (!) 147/85  Pulse: 66 (!) 58  Resp: 14 12  Temp: 97.8 F (36.6 C) 97.9 F (36.6 C)  SpO2: 95% 94%    General: Pt is alert, awake, not in acute distress Cardiovascular: RRR, S1/S2 +, no edema Respiratory: CTA bilaterally, no wheezing, no rhonchi, no respiratory distress, no conversational dyspnea  Abdominal: Soft, NT, ND, bowel sounds + Extremities: no edema, no cyanosis Psych: Normal mood and affect, stable judgement and insight     The results of significant diagnostics from this hospitalization (including imaging, microbiology, ancillary  and  laboratory) are listed below for reference.     Microbiology: Recent Results (from the past 240 hour(s))  SARS CORONAVIRUS 2 (TAT 6-24 HRS) Nasopharyngeal Nasopharyngeal Swab     Status: None   Collection Time: 07/01/19  7:41 PM   Specimen: Nasopharyngeal Swab  Result Value Ref Range Status   SARS Coronavirus 2 NEGATIVE NEGATIVE Final    Comment: (NOTE) SARS-CoV-2 target nucleic acids are NOT DETECTED. The SARS-CoV-2 RNA is generally detectable in upper and lower respiratory specimens during the acute phase of infection. Negative results do not preclude SARS-CoV-2 infection, do not rule out co-infections with other pathogens, and should not be used as the sole basis for treatment or other patient management decisions. Negative results must be combined with clinical observations, patient history, and epidemiological information. The expected result is Negative. Fact Sheet for Patients: SugarRoll.be Fact Sheet for Healthcare Providers: https://www.woods-mathews.com/ This test is not yet approved or cleared by the [place] FDA and  has been authorized for detection and/or diagnosis of SARS-CoV-2 by FDA under an Emergency Use Authorization (EUA). This EUA will remain  in effect (meaning this test can be used) for the duration of the COVID-19 declaration under Section 56 4(b)(1) of the Act, 21 U.S.C. section 360bbb-3(b)(1), unless the authorization is terminated or revoked sooner. Performed at [facility], [place] [address], [place], [place] [address]      Labs: BNP (last 3 results) No results for input(s): BNP in the last 8760 hours. Basic Metabolic Panel: Recent Labs  Lab 07/01/19 1801  NA 133*  K 4.4  CL 98  CO2 25  GLUCOSE 151*  BUN 13  CREATININE 0.95  CALCIUM 9.0   Liver Function Tests: Recent Labs  Lab 07/01/19 1801  AST 16  ALT 13  ALKPHOS 61  BILITOT 1.1  PROT 7.1  ALBUMIN 4.1   No results for  input(s): LIPASE, AMYLASE in the last 168 hours. No results for input(s): AMMONIA in the last 168 hours. CBC: Recent Labs  Lab 07/01/19 1801  WBC 7.8  NEUTROABS 6.9  HGB 14.0  HCT 43.0  MCV 88.5  PLT 215   Cardiac Enzymes: No results for input(s): CKTOTAL, CKMB, CKMBINDEX, TROPONINI in the last 168 hours. BNP: Invalid input(s): POCBNP CBG: Recent Labs  Lab 07/02/19 2141 07/03/19 0605 07/03/19 0903  GLUCAP 198* 156* 198*   D-Dimer No results for input(s): DDIMER in the last 72 hours. Hgb A1c No results for input(s): HGBA1C in the last 72 hours. Lipid Profile No results for input(s): CHOL, HDL, LDLCALC, TRIG, CHOLHDL, LDLDIRECT in the last 72 hours. Thyroid function studies No results for input(s): TSH, T4TOTAL, T3FREE, THYROIDAB in the last 72 hours.  Invalid input(s): FREET3 Anemia work up No results for input(s): VITAMINB12, FOLATE, FERRITIN, TIBC, IRON, RETICCTPCT in the last 72 hours. Urinalysis    Component Value Date/Time   COLORURINE YELLOW 07/02/2019 1229   APPEARANCEUR CLEAR 07/02/2019 1229   LABSPEC 1.029 07/02/2019 1229   PHURINE 5.0 07/02/2019 1229   GLUCOSEU 50 (A) 07/02/2019 1229   HGBUR NEGATIVE 07/02/2019 1229   BILIRUBINUR NEGATIVE 07/02/2019 1229   KETONESUR NEGATIVE 07/02/2019 1229   PROTEINUR 30 (A) 07/02/2019 1229   UROBILINOGEN 0.2 06/24/2010 1610   NITRITE NEGATIVE 07/02/2019 1229   LEUKOCYTESUR NEGATIVE 07/02/2019 1229   Sepsis Labs Invalid input(s): PROCALCITONIN,  WBC,  LACTICIDVEN Microbiology Recent Results (from the past 240 hour(s))  SARS CORONAVIRUS 2 (TAT 6-24 HRS) Nasopharyngeal Nasopharyngeal Swab     Status:  None   Collection Time: 07/01/19  7:41 PM   Specimen: Nasopharyngeal Swab  Result Value Ref Range Status   SARS Coronavirus 2 NEGATIVE NEGATIVE Final    Comment: (NOTE) SARS-CoV-2 target nucleic acids are NOT DETECTED. The SARS-CoV-2 RNA is generally detectable in upper and lower respiratory specimens during the  acute phase of infection. Negative results do not preclude SARS-CoV-2 infection, do not rule out co-infections with other pathogens, and should not be used as the sole basis for treatment or other patient management decisions. Negative results must be combined with clinical observations, patient history, and epidemiological information. The expected result is Negative. Fact Sheet for Patients: SugarRoll.be Fact Sheet for Healthcare Providers: https://www.woods-mathews.com/ This test is not yet approved or cleared by the [place] FDA and  has been authorized for detection and/or diagnosis of SARS-CoV-2 by FDA under an Emergency Use Authorization (EUA). This EUA will remain  in effect (meaning this test can be used) for the duration of the COVID-19 declaration under Section 56 4(b)(1) of the Act, 21 U.S.C. section 360bbb-3(b)(1), unless the authorization is terminated or revoked sooner. Performed at [facility], [place] [address] [address] [place], [place] [address]      Patient was seen and examined on the day of discharge and was found to be in stable condition. Time coordinating discharge: 25 minutes including assessment and coordination of care, as well as examination of the patient.   SIGNED:  Dessa Phi, DO [facility] 07/03/2019, 9:31 AM

## 2019-07-03 NOTE — Progress Notes (Signed)
Spoke with pt and his wife, [name] for pre-op call. Pt just discharged from this hospital today. Pt has hx of CAD with a CABG in 2010. Dr. Einar Gip is his cardiologist, last office visit was 05/25/19. Pt denies any recent chest pain or sob. Pt is a type 2 diabetic, he states he was taken off of his medication last year by Dr. Maurice Small. He is unsure when his last A1C was and what it was. States he does not check his blood sugar at home.  Pt had 2 covid tests done while he was hospitalized, but was not told to get another today. He will arrive early to get one done prior to surgery.  Pt states he has had both vaccines.

## 2019-07-03 NOTE — TOC Transition Note (Signed)
Transition of Care [facility]) - CM/SW Discharge Note   Patient Details  Name: [name] MRN: [id number] Date of Birth: October 22, 1945  [facility]) CM/SW Contact:  Pollie Friar, RN Phone Number: 07/03/2019, 10:07 AM   Clinical Narrative:    Pt discharging home with self care. Pt has hospital f/u and transportation home.    Final next level of care: Home/Self Care Barriers to Discharge: No Barriers Identified   Patient Goals and CMS Choice        Discharge Placement                       Discharge Plan and Services                                     Social Determinants of Health (SDOH) Interventions     Readmission Risk Interventions No flowsheet data found.

## 2019-07-04 ENCOUNTER — Inpatient Hospital Stay (HOSPITAL_COMMUNITY)
Admission: RE | Disposition: A | Discharge status: Home / Self Care | Payer: Self-pay | Source: Home / Self Care | Attending: Neurosurgery

## 2019-07-04 ENCOUNTER — Inpatient Hospital Stay (HOSPITAL_COMMUNITY): Payer: Medicare Other | Admitting: Anesthesiology

## 2019-07-04 ENCOUNTER — Inpatient Hospital Stay (HOSPITAL_COMMUNITY): Payer: Medicare Other

## 2019-07-04 ENCOUNTER — Inpatient Hospital Stay (HOSPITAL_COMMUNITY)
Admission: RE | Admit: 2019-07-04 | Discharge: 2019-07-07 | DRG: 026 | Disposition: A | Discharge status: Home / Self Care | Payer: Medicare Other | Attending: Neurosurgery | Admitting: Neurosurgery

## 2019-07-04 ENCOUNTER — Encounter (HOSPITAL_COMMUNITY): Payer: Self-pay | Admitting: Neurosurgery

## 2019-07-04 DIAGNOSIS — I639 Cerebral infarction, unspecified: Secondary | ICD-10-CM | POA: Diagnosis not present

## 2019-07-04 DIAGNOSIS — Z20822 Contact with and (suspected) exposure to covid-19: Secondary | ICD-10-CM | POA: Diagnosis present

## 2019-07-04 DIAGNOSIS — M1990 Unspecified osteoarthritis, unspecified site: Secondary | ICD-10-CM | POA: Diagnosis present

## 2019-07-04 DIAGNOSIS — C719 Malignant neoplasm of brain, unspecified: Secondary | ICD-10-CM | POA: Diagnosis not present

## 2019-07-04 DIAGNOSIS — Z951 Presence of aortocoronary bypass graft: Secondary | ICD-10-CM

## 2019-07-04 DIAGNOSIS — C714 Malignant neoplasm of occipital lobe: Principal | ICD-10-CM | POA: Diagnosis present

## 2019-07-04 DIAGNOSIS — Z955 Presence of coronary angioplasty implant and graft: Secondary | ICD-10-CM

## 2019-07-04 DIAGNOSIS — J9690 Respiratory failure, unspecified, unspecified whether with hypoxia or hypercapnia: Secondary | ICD-10-CM

## 2019-07-04 DIAGNOSIS — E785 Hyperlipidemia, unspecified: Secondary | ICD-10-CM | POA: Diagnosis present

## 2019-07-04 DIAGNOSIS — E1159 Type 2 diabetes mellitus with other circulatory complications: Secondary | ICD-10-CM | POA: Diagnosis present

## 2019-07-04 DIAGNOSIS — Z9889 Other specified postprocedural states: Secondary | ICD-10-CM

## 2019-07-04 DIAGNOSIS — I252 Old myocardial infarction: Secondary | ICD-10-CM | POA: Diagnosis not present

## 2019-07-04 DIAGNOSIS — Z87891 Personal history of nicotine dependence: Secondary | ICD-10-CM | POA: Diagnosis not present

## 2019-07-04 DIAGNOSIS — E1140 Type 2 diabetes mellitus with diabetic neuropathy, unspecified: Secondary | ICD-10-CM | POA: Diagnosis present

## 2019-07-04 DIAGNOSIS — Z4682 Encounter for fitting and adjustment of non-vascular catheter: Secondary | ICD-10-CM | POA: Diagnosis not present

## 2019-07-04 DIAGNOSIS — D496 Neoplasm of unspecified behavior of brain: Secondary | ICD-10-CM | POA: Diagnosis not present

## 2019-07-04 DIAGNOSIS — G4730 Sleep apnea, unspecified: Secondary | ICD-10-CM | POA: Diagnosis present

## 2019-07-04 DIAGNOSIS — E875 Hyperkalemia: Secondary | ICD-10-CM | POA: Diagnosis not present

## 2019-07-04 DIAGNOSIS — Z823 Family history of stroke: Secondary | ICD-10-CM

## 2019-07-04 DIAGNOSIS — J988 Other specified respiratory disorders: Secondary | ICD-10-CM | POA: Diagnosis not present

## 2019-07-04 DIAGNOSIS — I2510 Atherosclerotic heart disease of native coronary artery without angina pectoris: Secondary | ICD-10-CM | POA: Diagnosis present

## 2019-07-04 DIAGNOSIS — Z6830 Body mass index (BMI) 30.0-30.9, adult: Secondary | ICD-10-CM

## 2019-07-04 DIAGNOSIS — Z888 Allergy status to other drugs, medicaments and biological substances status: Secondary | ICD-10-CM | POA: Diagnosis not present

## 2019-07-04 DIAGNOSIS — J9600 Acute respiratory failure, unspecified whether with hypoxia or hypercapnia: Secondary | ICD-10-CM | POA: Diagnosis not present

## 2019-07-04 DIAGNOSIS — I10 Essential (primary) hypertension: Secondary | ICD-10-CM | POA: Diagnosis present

## 2019-07-04 DIAGNOSIS — Z8673 Personal history of transient ischemic attack (TIA), and cerebral infarction without residual deficits: Secondary | ICD-10-CM

## 2019-07-04 DIAGNOSIS — G9340 Encephalopathy, unspecified: Secondary | ICD-10-CM | POA: Diagnosis present

## 2019-07-04 DIAGNOSIS — H53462 Homonymous bilateral field defects, left side: Secondary | ICD-10-CM | POA: Diagnosis present

## 2019-07-04 DIAGNOSIS — Z923 Personal history of irradiation: Secondary | ICD-10-CM

## 2019-07-04 DIAGNOSIS — R918 Other nonspecific abnormal finding of lung field: Secondary | ICD-10-CM | POA: Diagnosis not present

## 2019-07-04 DIAGNOSIS — Z79899 Other long term (current) drug therapy: Secondary | ICD-10-CM | POA: Diagnosis not present

## 2019-07-04 DIAGNOSIS — G9389 Other specified disorders of brain: Secondary | ICD-10-CM | POA: Diagnosis not present

## 2019-07-04 DIAGNOSIS — Z8546 Personal history of malignant neoplasm of prostate: Secondary | ICD-10-CM | POA: Diagnosis not present

## 2019-07-04 DIAGNOSIS — R220 Localized swelling, mass and lump, head: Secondary | ICD-10-CM | POA: Diagnosis not present

## 2019-07-04 HISTORY — DX: Sleep apnea, unspecified: G47.30

## 2019-07-04 HISTORY — PX: CRANIOTOMY: SHX93

## 2019-07-04 HISTORY — PX: APPLICATION OF CRANIAL NAVIGATION: SHX6578

## 2019-07-04 HISTORY — DX: Unspecified osteoarthritis, unspecified site: M19.90

## 2019-07-04 LAB — GLUCOSE, CAPILLARY
Glucose-Capillary: 131 mg/dL — ABNORMAL HIGH (ref 70–99)
Glucose-Capillary: 158 mg/dL — ABNORMAL HIGH (ref 70–99)
Glucose-Capillary: 166 mg/dL — ABNORMAL HIGH (ref 70–99)
Glucose-Capillary: 164 mg/dL — ABNORMAL HIGH (ref 70–99)

## 2019-07-04 LAB — POCT I-STAT 7, (LYTES, BLD GAS, ICA,H+H)
Bicarbonate: 26.8 mmol/L (ref 20.0–28.0)
Calcium, Ion: 1.13 mmol/L — ABNORMAL LOW (ref 1.15–1.40)
HCT: 33 % — ABNORMAL LOW (ref 39.0–52.0)
Hemoglobin: 11.2 g/dL — ABNORMAL LOW (ref 13.0–17.0)
O2 Saturation: 100 %
Patient temperature: 97.1
Potassium: 4.4 mmol/L (ref 3.5–5.1)
Sodium: 135 mmol/L (ref 135–145)
TCO2: 28 mmol/L (ref 22–32)
pCO2 arterial: 48.3 mmHg — ABNORMAL HIGH (ref 32.0–48.0)
pH, Arterial: 7.347 — ABNORMAL LOW (ref 7.350–7.450)
pO2, Arterial: 265 mmHg — ABNORMAL HIGH (ref 83.0–108.0)

## 2019-07-04 LAB — HEMOGLOBIN A1C
Hgb A1c MFr Bld: 6 % — ABNORMAL HIGH (ref 4.8–5.6)
Mean Plasma Glucose: 125.5 mg/dL

## 2019-07-04 LAB — TYPE AND SCREEN
ABO/RH(D): A POS
Antibody Screen: NEGATIVE

## 2019-07-04 SURGERY — CRANIOTOMY TUMOR EXCISION
Anesthesia: General | Laterality: Right

## 2019-07-04 MED ORDER — LIDOCAINE-EPINEPHRINE 0.5 %-1:200000 IJ SOLN
INTRAMUSCULAR | Status: DC | PRN
  Administered 2019-07-04: 8 mL

## 2019-07-04 MED ORDER — LIDOCAINE HCL (CARDIAC) PF 100 MG/5ML IV SOSY
PREFILLED_SYRINGE | INTRAVENOUS | Status: DC | PRN
  Administered 2019-07-04: 100 mg via INTRAVENOUS

## 2019-07-04 MED ORDER — THROMBIN 20000 UNITS EX SOLR
CUTANEOUS | Status: AC
  Filled 2019-07-04: qty 20000

## 2019-07-04 MED ORDER — DEXAMETHASONE SODIUM PHOSPHATE 10 MG/ML IJ SOLN
INTRAMUSCULAR | Status: DC | PRN
  Administered 2019-07-04: 10 mg via INTRAVENOUS

## 2019-07-04 MED ORDER — FENTANYL CITRATE (PF) 250 MCG/5ML IJ SOLN
INTRAMUSCULAR | Status: AC
  Filled 2019-07-04: qty 5

## 2019-07-04 MED ORDER — CHLORHEXIDINE GLUCONATE 0.12% ORAL RINSE (MEDLINE KIT)
15.0000 mL | Freq: Two times a day (BID) | OROMUCOSAL | Status: DC
  Administered 2019-07-04 – 2019-07-06 (×4): 15 mL via OROMUCOSAL

## 2019-07-04 MED ORDER — ONDANSETRON HCL 4 MG PO TABS: 4.0000 mg | ORAL_TABLET | ORAL | Status: DC | PRN

## 2019-07-04 MED ORDER — PHENYLEPHRINE HCL-NACL 10-0.9 MG/250ML-% IV SOLN
INTRAVENOUS | Status: DC | PRN
  Administered 2019-07-04: 25 ug/min via INTRAVENOUS
  Administered 2019-07-04: 50 ug/min via INTRAVENOUS

## 2019-07-04 MED ORDER — DEXAMETHASONE SODIUM PHOSPHATE 4 MG/ML IJ SOLN
4.0000 mg | Freq: Four times a day (QID) | INTRAMUSCULAR | Status: AC
  Administered 2019-07-05 – 2019-07-06 (×4): 4 mg via INTRAVENOUS
  Filled 2019-07-04 (×5): qty 1

## 2019-07-04 MED ORDER — THROMBIN 5000 UNITS EX SOLR
OROMUCOSAL | Status: DC | PRN
  Administered 2019-07-04: 5 mL via TOPICAL

## 2019-07-04 MED ORDER — PROPOFOL 10 MG/ML IV BOLUS
INTRAVENOUS | Status: DC | PRN
  Administered 2019-07-04: 30 mg via INTRAVENOUS
  Administered 2019-07-04: 170 mg via INTRAVENOUS

## 2019-07-04 MED ORDER — MIDAZOLAM HCL 2 MG/2ML IJ SOLN
INTRAMUSCULAR | Status: AC
  Filled 2019-07-04: qty 2

## 2019-07-04 MED ORDER — LIDOCAINE-EPINEPHRINE 0.5 %-1:200000 IJ SOLN
INTRAMUSCULAR | Status: AC
  Filled 2019-07-04: qty 1

## 2019-07-04 MED ORDER — SODIUM CHLORIDE 0.9 % IV SOLN: INTRAVENOUS | Status: DC | PRN

## 2019-07-04 MED ORDER — ALBUMIN HUMAN 5 % IV SOLN: INTRAVENOUS | Status: DC | PRN

## 2019-07-04 MED ORDER — MORPHINE SULFATE (PF) 2 MG/ML IV SOLN
1.0000 mg | INTRAVENOUS | Status: DC | PRN
  Administered 2019-07-05 (×3): 2 mg via INTRAVENOUS
  Filled 2019-07-04 (×3): qty 1

## 2019-07-04 MED ORDER — ORAL CARE MOUTH RINSE
15.0000 mL | OROMUCOSAL | Status: DC
  Administered 2019-07-04 – 2019-07-05 (×9): 15 mL via OROMUCOSAL

## 2019-07-04 MED ORDER — OXYCODONE HCL 5 MG PO TABS: 5.0000 mg | ORAL_TABLET | Freq: Once | ORAL | Status: DC | PRN

## 2019-07-04 MED ORDER — PROMETHAZINE HCL 25 MG/ML IJ SOLN: 6.2500 mg | INTRAMUSCULAR | Status: DC | PRN

## 2019-07-04 MED ORDER — MICROFIBRILLAR COLL HEMOSTAT EX PADS
MEDICATED_PAD | CUTANEOUS | Status: DC | PRN
  Administered 2019-07-04: 1 via TOPICAL

## 2019-07-04 MED ORDER — HYDROMORPHONE HCL 1 MG/ML IJ SOLN: 0.2500 mg | INTRAMUSCULAR | Status: DC | PRN

## 2019-07-04 MED ORDER — DEXAMETHASONE SODIUM PHOSPHATE 10 MG/ML IJ SOLN
6.0000 mg | Freq: Four times a day (QID) | INTRAMUSCULAR | Status: AC
  Administered 2019-07-04 – 2019-07-05 (×4): 6 mg via INTRAVENOUS
  Filled 2019-07-04 (×4): qty 1

## 2019-07-04 MED ORDER — BACITRACIN ZINC 500 UNIT/GM EX OINT
TOPICAL_OINTMENT | CUTANEOUS | Status: DC | PRN
  Administered 2019-07-04 (×2): 1 via TOPICAL

## 2019-07-04 MED ORDER — THROMBIN 20000 UNITS EX SOLR
CUTANEOUS | Status: DC | PRN
  Administered 2019-07-04 (×2): 20 mL via TOPICAL

## 2019-07-04 MED ORDER — BACITRACIN ZINC 500 UNIT/GM EX OINT
TOPICAL_OINTMENT | CUTANEOUS | Status: AC
  Filled 2019-07-04: qty 28.35

## 2019-07-04 MED ORDER — POTASSIUM CHLORIDE IN NACL 20-0.9 MEQ/L-% IV SOLN
INTRAVENOUS | Status: DC
  Filled 2019-07-04 (×2): qty 1000

## 2019-07-04 MED ORDER — LIDOCAINE-EPINEPHRINE 1 %-1:100000 IJ SOLN
INTRAMUSCULAR | Status: AC
  Filled 2019-07-04: qty 1

## 2019-07-04 MED ORDER — ONDANSETRON HCL 4 MG/2ML IJ SOLN: 4.0000 mg | INTRAMUSCULAR | Status: DC | PRN

## 2019-07-04 MED ORDER — FENTANYL CITRATE (PF) 100 MCG/2ML IJ SOLN
INTRAMUSCULAR | Status: DC | PRN
  Administered 2019-07-04 (×2): 100 ug via INTRAVENOUS
  Administered 2019-07-04: 50 ug via INTRAVENOUS

## 2019-07-04 MED ORDER — INSULIN ASPART 100 UNIT/ML ~~LOC~~ SOLN
0.0000 [IU] | SUBCUTANEOUS | Status: DC
  Administered 2019-07-04 – 2019-07-05 (×2): 4 [IU] via SUBCUTANEOUS
  Administered 2019-07-05: 3 [IU] via SUBCUTANEOUS
  Administered 2019-07-05: 4 [IU] via SUBCUTANEOUS
  Administered 2019-07-05: 3 [IU] via SUBCUTANEOUS
  Administered 2019-07-05: 4 [IU] via SUBCUTANEOUS
  Administered 2019-07-05 – 2019-07-06 (×3): 3 [IU] via SUBCUTANEOUS
  Administered 2019-07-06 (×2): 7 [IU] via SUBCUTANEOUS
  Administered 2019-07-06: 3 [IU] via SUBCUTANEOUS
  Administered 2019-07-06: 4 [IU] via SUBCUTANEOUS
  Administered 2019-07-07: 3 [IU] via SUBCUTANEOUS
  Administered 2019-07-07: 4 [IU] via SUBCUTANEOUS
  Administered 2019-07-07: 3 [IU] via SUBCUTANEOUS

## 2019-07-04 MED ORDER — FENTANYL CITRATE (PF) 100 MCG/2ML IJ SOLN
25.0000 ug | INTRAMUSCULAR | Status: DC | PRN
  Administered 2019-07-04: 50 ug via INTRAVENOUS
  Administered 2019-07-05 (×2): 100 ug via INTRAVENOUS
  Filled 2019-07-04 (×3): qty 2

## 2019-07-04 MED ORDER — EPHEDRINE SULFATE 50 MG/ML IJ SOLN
INTRAMUSCULAR | Status: DC | PRN
  Administered 2019-07-04: 10 mg via INTRAVENOUS

## 2019-07-04 MED ORDER — SODIUM CHLORIDE 0.9 % IV SOLN: INTRAVENOUS | Status: DC

## 2019-07-04 MED ORDER — PANTOPRAZOLE SODIUM 40 MG IV SOLR
40.0000 mg | Freq: Every day | INTRAVENOUS | Status: DC
  Administered 2019-07-04 – 2019-07-05 (×2): 40 mg via INTRAVENOUS
  Filled 2019-07-04 (×2): qty 40

## 2019-07-04 MED ORDER — LABETALOL HCL 5 MG/ML IV SOLN: 10.0000 mg | INTRAVENOUS | Status: DC | PRN

## 2019-07-04 MED ORDER — LEVETIRACETAM IN NACL 500 MG/100ML IV SOLN
500.0000 mg | Freq: Two times a day (BID) | INTRAVENOUS | Status: DC
  Administered 2019-07-04 – 2019-07-05 (×3): 500 mg via INTRAVENOUS
  Filled 2019-07-04 (×3): qty 100

## 2019-07-04 MED ORDER — NALOXONE HCL 0.4 MG/ML IJ SOLN: 0.0800 mg | INTRAMUSCULAR | Status: DC | PRN

## 2019-07-04 MED ORDER — PROPOFOL 1000 MG/100ML IV EMUL
0.0000 ug/kg/min | INTRAVENOUS | Status: DC
  Administered 2019-07-04: 40 ug/kg/min via INTRAVENOUS
  Administered 2019-07-05: 20 ug/kg/min via INTRAVENOUS
  Administered 2019-07-05: 30 ug/kg/min via INTRAVENOUS
  Filled 2019-07-04 (×3): qty 100

## 2019-07-04 MED ORDER — HYDROCODONE-ACETAMINOPHEN 5-325 MG PO TABS
1.0000 | ORAL_TABLET | ORAL | Status: DC | PRN
  Administered 2019-07-05 – 2019-07-07 (×5): 1 via ORAL
  Filled 2019-07-04 (×5): qty 1

## 2019-07-04 MED ORDER — PROPOFOL 500 MG/50ML IV EMUL
INTRAVENOUS | Status: DC | PRN
  Administered 2019-07-04: 50 ug/kg/min via INTRAVENOUS

## 2019-07-04 MED ORDER — ROCURONIUM BROMIDE 100 MG/10ML IV SOLN
INTRAVENOUS | Status: DC | PRN
  Administered 2019-07-04: 30 mg via INTRAVENOUS
  Administered 2019-07-04: 60 mg via INTRAVENOUS
  Administered 2019-07-04: 20 mg via INTRAVENOUS

## 2019-07-04 MED ORDER — 0.9 % SODIUM CHLORIDE (POUR BTL) OPTIME
TOPICAL | Status: DC | PRN
  Administered 2019-07-04 (×3): 1000 mL

## 2019-07-04 MED ORDER — DEXAMETHASONE SODIUM PHOSPHATE 4 MG/ML IJ SOLN
4.0000 mg | Freq: Three times a day (TID) | INTRAMUSCULAR | Status: DC
  Administered 2019-07-06 – 2019-07-07 (×3): 4 mg via INTRAVENOUS
  Filled 2019-07-04 (×3): qty 1

## 2019-07-04 MED ORDER — FENTANYL CITRATE (PF) 100 MCG/2ML IJ SOLN
25.0000 ug | INTRAMUSCULAR | Status: DC | PRN
  Filled 2019-07-04: qty 2

## 2019-07-04 MED ORDER — OXYCODONE HCL 5 MG/5ML PO SOLN: 5.0000 mg | Freq: Once | ORAL | Status: DC | PRN

## 2019-07-04 MED ORDER — PROPOFOL 10 MG/ML IV BOLUS
INTRAVENOUS | Status: AC
  Filled 2019-07-04: qty 20

## 2019-07-04 MED ORDER — CEFAZOLIN SODIUM-DEXTROSE 2-4 GM/100ML-% IV SOLN
INTRAVENOUS | Status: AC
  Filled 2019-07-04: qty 100

## 2019-07-04 MED ORDER — THROMBIN 5000 UNITS EX SOLR
CUTANEOUS | Status: AC
  Filled 2019-07-04: qty 5000

## 2019-07-04 MED ORDER — PROMETHAZINE HCL 25 MG PO TABS: 12.5000 mg | ORAL_TABLET | ORAL | Status: DC | PRN

## 2019-07-04 SURGICAL SUPPLY — 88 items
APL SKNCLS STERI-STRIP NONHPOA (GAUZE/BANDAGES/DRESSINGS)
BENZOIN TINCTURE PRP APPL 2/3 (GAUZE/BANDAGES/DRESSINGS) IMPLANT
BLADE CLIPPER SURG (BLADE) ×3 IMPLANT
BLADE SAW GIGLI 16 STRL (MISCELLANEOUS) IMPLANT
BLADE SURG 15 STRL LF DISP TIS (BLADE) IMPLANT
BLADE SURG 15 STRL SS (BLADE)
BLADE ULTRA TIP 2M (BLADE) IMPLANT
BNDG GAUZE ELAST 4 BULKY (GAUZE/BANDAGES/DRESSINGS) IMPLANT
BUR ACORN 6.0 PRECISION (BURR) ×3 IMPLANT
BUR MATCHSTICK NEURO 3.0 LAGG (BURR) ×1 IMPLANT
BUR SPIRAL ROUTER 2.3 (BUR) ×1 IMPLANT
CANISTER SUCT 3000ML PPV (MISCELLANEOUS) ×3 IMPLANT
CARTRIDGE OIL MAESTRO DRILL (MISCELLANEOUS) ×2 IMPLANT
CATH VENTRIC 35X38 W/TROCAR LG (CATHETERS) IMPLANT
CLIP VESOCCLUDE MED 6/CT (CLIP) ×1 IMPLANT
CNTNR URN SCR LID CUP LEK RST (MISCELLANEOUS) ×2 IMPLANT
CONT SPEC 4OZ STRL OR WHT (MISCELLANEOUS) ×6
COVER WAND RF STERILE (DRAPES) ×3 IMPLANT
DECANTER SPIKE VIAL GLASS SM (MISCELLANEOUS) ×3 IMPLANT
DIFFUSER DRILL AIR PNEUMATIC (MISCELLANEOUS) ×3 IMPLANT
DRAIN SUBARACHNOID (WOUND CARE) IMPLANT
DRAPE CAMERA VIDEO/LASER (DRAPES) IMPLANT
DRAPE MICROSCOPE LEICA (MISCELLANEOUS) ×1 IMPLANT
DRAPE NEUROLOGICAL W/INCISE (DRAPES) ×3 IMPLANT
DRAPE ORTHO SPLIT 77X108 STRL (DRAPES)
DRAPE SURG 17X23 STRL (DRAPES) IMPLANT
DRAPE SURG ORHT 6 SPLT 77X108 (DRAPES) IMPLANT
DRAPE WARM FLUID 44X44 (DRAPES) ×3 IMPLANT
DRSG OPSITE POSTOP 4X8 (GAUZE/BANDAGES/DRESSINGS) ×1 IMPLANT
DURAPREP 6ML APPLICATOR 50/CS (WOUND CARE) ×3 IMPLANT
ELECT REM PT RETURN 9FT ADLT (ELECTROSURGICAL) ×3
ELECTRODE REM PT RTRN 9FT ADLT (ELECTROSURGICAL) ×2 IMPLANT
EVACUATOR 1/8 PVC DRAIN (DRAIN) IMPLANT
EVACUATOR SILICONE 100CC (DRAIN) IMPLANT
FORCEPS BIPO MALIS IRRIG 9X1.5 (NEUROSURGERY SUPPLIES) ×1 IMPLANT
GAUZE 4X4 16PLY RFD (DISPOSABLE) IMPLANT
GAUZE SPONGE 4X4 12PLY STRL (GAUZE/BANDAGES/DRESSINGS) ×3 IMPLANT
GLOVE ECLIPSE 6.5 STRL STRAW (GLOVE) ×3 IMPLANT
GLOVE EXAM NITRILE XL STR (GLOVE) IMPLANT
GOWN STRL REUS W/ TWL LRG LVL3 (GOWN DISPOSABLE) ×4 IMPLANT
GOWN STRL REUS W/ TWL XL LVL3 (GOWN DISPOSABLE) IMPLANT
GOWN STRL REUS W/TWL 2XL LVL3 (GOWN DISPOSABLE) IMPLANT
GOWN STRL REUS W/TWL LRG LVL3 (GOWN DISPOSABLE) ×6
GOWN STRL REUS W/TWL XL LVL3 (GOWN DISPOSABLE)
GRAFT DURAGEN MATRIX 2WX2L ×1 IMPLANT
HEMOSTAT POWDER KIT SURGIFOAM (HEMOSTASIS) ×1 IMPLANT
HEMOSTAT SURGICEL 2X14 (HEMOSTASIS) ×1 IMPLANT
HOOK DURA 1/2IN (MISCELLANEOUS) ×3 IMPLANT
KIT BASIN OR (CUSTOM PROCEDURE TRAY) ×3 IMPLANT
KIT DRAIN CSF ACCUDRAIN (MISCELLANEOUS) IMPLANT
KIT TURNOVER KIT B (KITS) ×3 IMPLANT
MARKER SPHERE PSV REFLC 13MM (MARKER) ×6 IMPLANT
NDL HYPO 25X1 1.5 SAFETY (NEEDLE) ×2 IMPLANT
NDL SPNL 18GX3.5 QUINCKE PK (NEEDLE) IMPLANT
NEEDLE HYPO 25X1 1.5 SAFETY (NEEDLE) ×3 IMPLANT
NEEDLE SPNL 18GX3.5 QUINCKE PK (NEEDLE) IMPLANT
NS IRRIG 1000ML POUR BTL (IV SOLUTION) ×5 IMPLANT
OIL CARTRIDGE MAESTRO DRILL (MISCELLANEOUS) ×3
PACK CRANIOTOMY CUSTOM (CUSTOM PROCEDURE TRAY) ×3 IMPLANT
PATTIES SURGICAL .25X.25 (GAUZE/BANDAGES/DRESSINGS) IMPLANT
PATTIES SURGICAL .5 X.5 (GAUZE/BANDAGES/DRESSINGS) IMPLANT
PATTIES SURGICAL .5 X3 (DISPOSABLE) IMPLANT
PATTIES SURGICAL 1/4 X 3 (GAUZE/BANDAGES/DRESSINGS) IMPLANT
PATTIES SURGICAL 1X1 (DISPOSABLE) ×1 IMPLANT
PLATE 1.5  2HOLE MED NEURO (Plate) ×3 IMPLANT
PLATE 1.5 2HOLE MED NEURO (Plate) IMPLANT
PLATE 1.5 5HOLE SQUARE (Plate) ×1 IMPLANT
PLATE 1.5/0.5 13MM BURR HOLE (Plate) ×1 IMPLANT
RUBBERBAND STERILE (MISCELLANEOUS) ×2 IMPLANT
SCREW SELF DRILL HT 1.5/4MM (Screw) ×9 IMPLANT
SET TUBING IRRIGATION DISP (TUBING) ×1 IMPLANT
SPECIMEN JAR SMALL (MISCELLANEOUS) IMPLANT
SPONGE NEURO XRAY DETECT 1X3 (DISPOSABLE) IMPLANT
SPONGE SURGIFOAM ABS GEL 100 (HEMOSTASIS) ×4 IMPLANT
STAPLER VISISTAT 35W (STAPLE) ×3 IMPLANT
SUT ETHILON 3 0 FSL (SUTURE) IMPLANT
SUT ETHILON 3 0 PS 1 (SUTURE) IMPLANT
SUT NURALON 4 0 TR CR/8 (SUTURE) ×8 IMPLANT
SUT SILK 0 TIES 10X30 (SUTURE) IMPLANT
SUT STEEL 0 (SUTURE)
SUT STEEL 0 18XMFL TIE 17 (SUTURE) IMPLANT
SUT VIC AB 2-0 CT2 18 VCP726D (SUTURE) ×4 IMPLANT
TOWEL GREEN STERILE (TOWEL DISPOSABLE) ×3 IMPLANT
TOWEL GREEN STERILE FF (TOWEL DISPOSABLE) ×3 IMPLANT
TRAY FOLEY MTR SLVR 16FR STAT (SET/KITS/TRAYS/PACK) ×3 IMPLANT
TUBE CONNECTING 12X1/4 (SUCTIONS) ×3 IMPLANT
UNDERPAD 30X30 (UNDERPADS AND DIAPERS) ×2 IMPLANT
WATER STERILE IRR 1000ML POUR (IV SOLUTION) ×3 IMPLANT

## 2019-07-04 NOTE — Anesthesia Procedure Notes (Signed)
Procedure Name: Intubation Date/Time: 07/04/2019 1:37 PM Performed by: Eligha Bridegroom, CRNA Pre-anesthesia Checklist: Patient identified, Emergency Drugs available, Suction available, Patient being monitored and Timeout performed Patient Re-evaluated:Patient Re-evaluated prior to induction Oxygen Delivery Method: Circle system utilized Preoxygenation: Pre-oxygenation with 100% oxygen Induction Type: IV induction Ventilation: Mask ventilation without difficulty Laryngoscope Size: Mac and 4 Grade View: Grade I Tube type: Oral Tube size: 7.5 mm Airway Equipment and Method: Stylet Placement Confirmation: ETT inserted through vocal cords under direct vision,  positive ETCO2 and breath sounds checked- equal and bilateral Secured at: 22 cm Tube secured with: Tape Dental Injury: Teeth and Oropharynx as per pre-operative assessment

## 2019-07-04 NOTE — Anesthesia Postprocedure Evaluation (Signed)
Anesthesia Post Note  Patient: [name]  Procedure(s) Performed: Right occipital craniotomy for tumor with brainlab (Right ) APPLICATION OF CRANIAL NAVIGATION (N/A )     Patient location during evaluation: SICU Anesthesia Type: General Level of consciousness: sedated Pain management: pain level controlled Vital Signs Assessment: post-procedure vital signs reviewed and stable Respiratory status: patient remains intubated per anesthesia plan Cardiovascular status: stable Postop Assessment: no apparent nausea or vomiting Anesthetic complications: no    Last Vitals:  Vitals:   07/04/19 1721 07/04/19 1800  BP:  135/73  Pulse:    Resp:  12  Temp: 36.6 C (!) 36.2 C  SpO2:      Last Pain:  Vitals:   07/04/19 1645  TempSrc:   PainSc: 0-No pain                 Barnet Glasgow

## 2019-07-04 NOTE — Interval H&P Note (Signed)
History and Physical Interval Note:  07/04/2019 1:27 PM  [name]  has presented today for surgery, with the diagnosis of Brain tumor.  The various methods of treatment have been discussed with the patient and family. After consideration of risks, benefits and other options for treatment, the patient has consented to  Procedure(s): Right occipital craniotomy for tumor with brainlab (Right) APPLICATION OF CRANIAL NAVIGATION (N/A) as a surgical intervention.  The patient's history has been reviewed, patient examined, no change in status, stable for surgery.  I have reviewed the patient's chart and labs.  Questions were answered to the patient's satisfaction.     Ashok Pall

## 2019-07-04 NOTE — Transfer of Care (Signed)
Immediate Anesthesia Transfer of Care Note  Patient: [name]  Procedure(s) Performed: Right occipital craniotomy for tumor with brainlab (Right ) APPLICATION OF CRANIAL NAVIGATION (N/A )  Patient Location: PACU  Anesthesia Type:General  Level of Consciousness: sedated, unresponsive and Patient remains intubated per anesthesia plan  Airway & Oxygen Therapy: Patient remains intubated per anesthesia plan  Post-op Assessment: Report given to RN and Post -op Vital signs reviewed and stable  Post vital signs: Reviewed and stable  Last Vitals:  Vitals Value Taken Time  BP 116/63 07/04/19 1700  Temp 36.5 C 07/04/19 1645  Pulse 58 07/04/19 1704  Resp 13 07/04/19 1705  SpO2 100 % 07/04/19 1704  Vitals shown include unvalidated device data.  Last Pain:  Vitals:   07/04/19 1645  TempSrc:   PainSc: 0-No pain         Complications: No apparent anesthesia complications

## 2019-07-04 NOTE — Op Note (Signed)
07/04/2019  4:35 PM  PATIENT:  [name]  74 y.o. male  PRE-OPERATIVE DIAGNOSIS:  Brain tumor, right occipital  POST-OPERATIVE DIAGNOSIS:  Brain tumor, right occipital  PROCEDURE:  Procedure(s): Right occipital craniotomy for tumor with brainlab APPLICATION OF CRANIAL NAVIGATION  SURGEON: Surgeon(s): Ashok Pall, MD Vallarie Mare, MD  ASSISTANTS:Thomas, Roderic Palau  ANESTHESIA:   general  EBL:  Total I/O In: 1250 [I.V.:1000; IV Piggyback:250] Out: 21 [Urine:505; Blood:325]  BLOOD ADMINISTERED:none  CELL SAVER GIVEN:none  COUNT:Per nursing  DRAINS: none   SPECIMEN:  Source of Specimen:  occipital lobe  DICTATION: [name] was taken to the operating room, intubated, and placed under a general anesthetic without difficulty. A foley catheter was placed under sterile conditions He was positioned prone. His head was shaved and was prepped and was draped in a sterile manner. I placed his head in the [place] head holder and secured the frame to the bed with him prone. His head was pointed straight down. I placed the Brain Lab equipment, and localized his head with the preop scan used for planning the craniotomy.  With the localization done I made a horizontal incision overlying the tumor. I drilled a burr hole, then used a tool to separate the dura from the skull. I used the craniotome to turn a craniotomy. I tore the dura with the craniotome, and opened the transverse sinus on the right side. I controlled the bleeding with packing. Dr. Marcello Moores assisted with control of the sinus. I opened the dura overlying the tumor and resected abnormal tissue sending it to the pathologist. They confirmed that it was lesional tissue with significant necrosis. We were able to isolate the leaves of the sinus and sutured them close to stop bleeding from their edges. Reconstitution of the sinus was not possible. I packed the medial and lateral openings with gelfoam, with the bleeding  controlled. The resection cavity bleeding was controlled with irrigation. I did not attempt a total resection since the tumor lined the occipital horn of the the right lateral ventricle. I stayed out of the ventricle. I used navigation to plan the opening and to guide the resection. With all bleeding at the time controlled I placed a piece of [place] over the resection site. Replaced the bone flap and secured it with plates and screws. I approximated the galea with vicryl sutures. I approximated the scalp edges with staples. I dressed the wound with an occlusive bandage.I removed the head holder from the bed, we turned the patient supine on the bed. He will remain intubated overnight.  PLAN OF CARE: Admit to inpatient   PATIENT DISPOSITION:  ICU - intubated and hemodynamically stable.   Delay start of Pharmacological VTE agent (>24hrs) due to surgical blood loss or risk of bleeding:  yes

## 2019-07-04 NOTE — Consult Note (Addendum)
NAME:  [name], MRN:  [id number], DOB:  [birth year], LOS: 0 ADMISSION DATE:  07/04/2019, CONSULTATION DATE:  07/04/19 REFERRING MD:  Christella Noa  CHIEF COMPLAINT:  Vent Management   Brief History   [name] is a 74 y.o. male who was admitted 3/16 for elective right occipital craniotomy for tumor removal.  Had recent admission 3/13 through 3/15 for headaches, fevers, N/V and was found to have brain mass.  History of present illness   Pt is encephelopathic; therefore, this HPI is obtained from chart review. [name] is a 74 y.o. male who has a PMH including but not limited to prostate CA s/p XRT, DM, CAD, HTN (see "past medical history" for rest).   He had recent admission 07/01/19 through 07/03/19 for headaches, fevers, nausea, vomiting.  He was found to have right occipital brain mass.  He presented 3/16 for right occipital craniotomy for tumor removal.  Past Medical History  has ADENOCARCINOMA, PROSTATE; HYPERLIPIDEMIA; TOBACCO ABUSE; ATHEROSCLEROTIC CARDIOVASCULAR DISEASE; INTERMITTENT VERTIGO; ABDOMINAL PAIN -GENERALIZED; Atherosclerosis of native artery of extremity with intermittent claudication ([place]); Spinal stenosis of lumbar region with neurogenic claudication; TIA (transient ischemic attack); ASCVD (arteriosclerotic cardiovascular disease); Diabetes mellitus without complication ([place]); Resistant hypertension; Hyperlipidemia; Carotid stenosis, right; Carotid artery stenosis, symptomatic; S/P carotid endarterectomy; Essential hypertension; Type 2 diabetes mellitus with circulatory disorder (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  [place] Hospital Events   3/16 > admit.  Consults:   Procedures:  ETT 3/16 >   Significant Diagnostic Tests:  CT head and brain MRI 3/14 > right occipital mass.  Micro Data:  COVID 3/16 > neg.  Antimicrobials:  None.   Interim history/subjective:  Sedated on propofol.  Objective:  Blood pressure 131/68, pulse (!) 59, temperature 97.8 F (36.6 C), resp. rate 12, height 6\' 2"  (1.88 m), weight 107.5 kg, SpO2 100 %.    Vent Mode: PRVC FiO2 (%):  [100 %] 100 % Set Rate:  [12 bmp] 12 bmp Vt Set:  [650 mL] 650 mL PEEP:  [5 cmH20] 5 cmH20 Plateau Pressure:  [19 cmH20] 19 cmH20   Intake/Output Summary (Last 24 hours) at 07/04/2019 1809 Last data filed at 07/04/2019 1717 Gross per 24 hour  Intake 1900 ml  Output 1020 ml  Net 880 ml   Filed Weights   07/04/19 1007  Weight: 107.5 kg    Examination: General: Adult male, in NAD. Neuro: Sedated, not following commands. HEENT: [place]/AT. Sclerae anicteric.  Craniotomy site without bleeding. Cardiovascular: RRR, no M/R/G.  Lungs: Respirations even and unlabored.  CTA bilaterally, No W/R/R.  Abdomen: BS x 4, soft, NT/ND.  Musculoskeletal: No gross deformities, no edema.  Skin: Intact, warm, no rashes.  Assessment & Plan:   Right occipital mass - s/p resection 3/16. - Post op care per neurosurgery. - Continue empiric decadron, keppra.  Respiratory insufficiency with compromised airway - 2/2 above. - Full vent support for tonight. - SBT in AM after repeat CT scan. - Bronchial hygiene. - Follow CXR.  Hx DM - diet controlled (had previously been on meds). - SSI if glucose consistently > 180.  Hx CAD, HTN. - Continue home lipitor. - Hold home pletal, ASA, norvasc, losartan-HCTZ, toprol.  Hx  prostate CA - s/p XRT. - F/u with oncology as an outpatient.  Best Practice:  Diet: NPO. Pain/Anxiety/Delirium protocol (if indicated): Propofol gtt / Fentanyl PRN.  RASS goal -1 to -2. VAP protocol  (if indicated): In place. DVT prophylaxis: SCD's. GI prophylaxis: PPI. Glucose control: SSI if glucose consistently > 180. Mobility: Bedrest. Code Status: Full. Family Communication: Wife updated at bedside. Disposition: ICU.  Labs   CBC: Recent Labs  Lab 07/01/19 1801  WBC 7.8  NEUTROABS 6.9  HGB 14.0  HCT 43.0  MCV 88.5  PLT 123456   Basic Metabolic Panel: Recent Labs  Lab 07/01/19 1801  NA 133*  K 4.4  CL 98  CO2 25  GLUCOSE 151*  BUN 13  CREATININE 0.95  CALCIUM 9.0   GFR: Estimated Creatinine Clearance: 90.4 mL/min (by C-G formula based on SCr of 0.95 mg/dL). Recent Labs  Lab 07/01/19 1801  PROCALCITON <0.10  WBC 7.8   Liver Function Tests: Recent Labs  Lab 07/01/19 1801  AST 16  ALT 13  ALKPHOS 61  BILITOT 1.1  PROT 7.1  ALBUMIN 4.1   No results for input(s): LIPASE, AMYLASE in the last 168 hours. No results for input(s): AMMONIA in the last 168 hours. ABG    Component Value Date/Time   TCO2 30 06/18/2015 1817    Coagulation Profile: No results for input(s): INR, PROTIME in the last 168 hours. Cardiac Enzymes: No results for input(s): CKTOTAL, CKMB, CKMBINDEX, TROPONINI in the last 168 hours. HbA1C: Hgb A1c MFr Bld  Date/Time Value Ref Range Status  07/04/2019 10:58 AM 6.0 (H) 4.8 - 5.6 % Final    Comment:    (NOTE) Pre diabetes:          5.7%-6.4% Diabetes:              >6.4% Glycemic control for   <7.0% adults with diabetes   06/19/2015 06:25 AM 6.2 (H) 4.8 - 5.6 % Final    Comment:    (NOTE)         Pre-diabetes: 5.7 - 6.4         Diabetes: >6.4         Glycemic control for adults with diabetes: <7.0    CBG: Recent Labs  Lab 07/02/19 2141 07/03/19 0605 07/03/19 0903 07/04/19 1013 07/04/19 1645  GLUCAP 198* 156* 198* 131* 158*    Review of Systems:   Unable to obtain as pt is encephalopathic.  Past medical history  He,  has a past medical history of Anxiety, Arthritis, ASCVD (arteriosclerotic cardiovascular  disease), CAD (coronary artery disease), Carcinoma of prostate ([place]), Constipation due to pain medication, Diabetes mellitus without complication ([place]), Diabetic neuropathy ([place]), Headache (2021), Hyperlipidemia, Hypertension, Mild depression ([place]), Morbid obesity ([place]), Myocardial infarction [facility]), Shortness of breath dyspnea, Sleep apnea, Stroke ([place]) (2017), and Tobacco abuse.   Surgical History    Past Surgical History:  Procedure Laterality Date  . BACK SURGERY    . CARDIAC CATHETERIZATION     X 1 stent before having CABG  . CORONARY ARTERY BYPASS GRAFT  06/28/2008   X4  . ENDARTERECTOMY Right 07/03/2015   Procedure: RIGHT CAROTID ENDARTERECTOMY WITH BOVINE PERICARDIUM PATCH ANGIOPLASTY;  Surgeon: Serafina Mitchell, MD;  Location: [facility];  Service: Vascular;  Laterality: Right;  . LUMBAR LAMINECTOMY/DECOMPRESSION MICRODISCECTOMY N/A 09/06/2014   Procedure: LUMBER DECOMPRESSION L3-5;  Surgeon: Melina Schools, MD;  Location: [facility];  Service: Orthopedics;  Laterality: N/A;  . LUMBAR SPINE SURGERY  2002  . PROSTATE  BIOPSY    . PROSTATECTOMY  02/2007  . VASECTOMY       Social History   reports that he quit smoking about 11 years ago. His smoking use included cigarettes. He has a 40.00 pack-year smoking history. He quit smokeless tobacco use about 10 years ago. He reports current alcohol use of about 3.0 standard drinks of alcohol per week. He reports that he does not use drugs.   Family history   His family history includes Breast cancer in his sister; Kidney disease in his mother; Stroke in his father. There is no history of Colon cancer, Prostate cancer, or Pancreatic cancer.   Allergies Allergies  Allergen Reactions  . Lisinopril Cough     Home meds  Prior to Admission medications   Medication Sig Start Date End Date Taking? Authorizing Provider  amLODipine (NORVASC) 10 MG tablet TAKE 1 TABLET BY MOUTH DAILY Patient taking differently: Take 10 mg by mouth daily.  04/12/19  Yes  Miquel Dunn, NP  atorvastatin (LIPITOR) 80 MG tablet TAKE 1 TABLET BY MOUTH DAILY AT 6 PM Patient taking differently: TAKE 1 TABLET BY MOUTH DAILY AT 6 PM 12/19/18  Yes Miquel Dunn, NP  baclofen (LIORESAL) 20 MG tablet Take 20 mg by mouth 2 (two) times daily.    Yes [provider]  cilostazol (PLETAL) 50 MG tablet Take 1 tablet (50 mg total) by mouth 2 (two) times daily. 05/25/19  Yes Miquel Dunn, NP  dexamethasone (DECADRON) 4 MG tablet Take 1 tablet (4 mg total) by mouth 2 (two) times daily with a meal. 07/03/19 08/02/19 Yes Dessa Phi, DO  levETIRAcetam (KEPPRA) 500 MG tablet Take 1 tablet (500 mg total) by mouth 2 (two) times daily. 07/03/19  Yes Dessa Phi, DO  losartan-hydrochlorothiazide (HYZAAR) 100-12.5 MG tablet Take 1 tablet by mouth daily.  03/09/17  Yes [provider]  Magnesium Hydroxide (DULCOLAX PO) Take 1 tablet by mouth daily as needed (constipation).   Yes [provider]  metoprolol succinate (TOPROL-XL) 25 MG 24 hr tablet TAKE 1 TABLET BY MOUTH DAILY Patient taking differently: Take 25 mg by mouth daily.  09/08/18  Yes Miquel Dunn, NP  Bempedoic Acid-Ezetimibe (NEXLIZET) 180-10 MG TABS Take 1 tablet by mouth daily. 12/29/18   Miquel Dunn, NP    Critical care time: 40 min.    Montey Hora, [facility] 07/04/2019, 6:09 PM

## 2019-07-04 NOTE — Progress Notes (Signed)
RT called to place patient on vent in PACU after procedure. Placed on 650 (8CC) 12, 100%, +5. 7.5 ETT secured at 24 with etad. Patient then transported to 4N17 with no issues. RT to give report to 4N RT

## 2019-07-05 ENCOUNTER — Encounter: Payer: Self-pay | Admitting: *Deleted

## 2019-07-05 ENCOUNTER — Inpatient Hospital Stay (HOSPITAL_COMMUNITY): Payer: Medicare Other

## 2019-07-05 DIAGNOSIS — J9600 Acute respiratory failure, unspecified whether with hypoxia or hypercapnia: Secondary | ICD-10-CM

## 2019-07-05 DIAGNOSIS — C719 Malignant neoplasm of brain, unspecified: Secondary | ICD-10-CM

## 2019-07-05 LAB — CBC
HCT: 31.3 % — ABNORMAL LOW (ref 39.0–52.0)
Hemoglobin: 10.6 g/dL — ABNORMAL LOW (ref 13.0–17.0)
MCH: 30.3 pg (ref 26.0–34.0)
MCHC: 33.9 g/dL (ref 30.0–36.0)
MCV: 89.4 fL (ref 80.0–100.0)
Platelets: 192 10*3/uL (ref 150–400)
RBC: 3.5 MIL/uL — ABNORMAL LOW (ref 4.22–5.81)
RDW: 12.8 % (ref 11.5–15.5)
WBC: 8.4 10*3/uL (ref 4.0–10.5)
nRBC: 0 % (ref 0.0–0.2)

## 2019-07-05 LAB — GLUCOSE, CAPILLARY
Glucose-Capillary: 145 mg/dL — ABNORMAL HIGH (ref 70–99)
Glucose-Capillary: 159 mg/dL — ABNORMAL HIGH (ref 70–99)
Glucose-Capillary: 159 mg/dL — ABNORMAL HIGH (ref 70–99)
Glucose-Capillary: 140 mg/dL — ABNORMAL HIGH (ref 70–99)
Glucose-Capillary: 128 mg/dL — ABNORMAL HIGH (ref 70–99)
Glucose-Capillary: 131 mg/dL — ABNORMAL HIGH (ref 70–99)

## 2019-07-05 LAB — BASIC METABOLIC PANEL
Anion gap: 12 (ref 5–15)
BUN: 26 mg/dL — ABNORMAL HIGH (ref 8–23)
CO2: 24 mmol/L (ref 22–32)
Calcium: 8.3 mg/dL — ABNORMAL LOW (ref 8.9–10.3)
Chloride: 97 mmol/L — ABNORMAL LOW (ref 98–111)
Creatinine, Ser: 0.98 mg/dL (ref 0.61–1.24)
GFR calc Af Amer: >60 mL/min (ref >60)
GFR calc non Af Amer: >60 mL/min (ref >60)
Glucose, Bld: 162 mg/dL — ABNORMAL HIGH (ref 70–99)
Potassium: 5 mmol/L (ref 3.5–5.1)
Sodium: 133 mmol/L — ABNORMAL LOW (ref 135–145)

## 2019-07-05 LAB — TRIGLYCERIDES: Triglycerides: 499 mg/dL — ABNORMAL HIGH (ref <150)

## 2019-07-05 LAB — PHOSPHORUS: Phosphorus: 4.3 mg/dL (ref 2.5–4.6)

## 2019-07-05 LAB — MAGNESIUM: Magnesium: 1.8 mg/dL (ref 1.7–2.4)

## 2019-07-05 MED ORDER — CHLORHEXIDINE GLUCONATE CLOTH 2 % EX PADS
6.0000 | MEDICATED_PAD | Freq: Every day | CUTANEOUS | Status: DC
  Administered 2019-07-06: 6 via TOPICAL

## 2019-07-05 MED ORDER — CHLORHEXIDINE GLUCONATE CLOTH 2 % EX PADS
6.0000 | MEDICATED_PAD | Freq: Every day | CUTANEOUS | Status: DC
  Administered 2019-07-05: 6 via TOPICAL

## 2019-07-05 MED FILL — Thrombin For Soln 20000 Unit: CUTANEOUS | Qty: 1 | Status: AC

## 2019-07-05 NOTE — Procedures (Signed)
Extubation Procedure Note  Patient Details:   Name: [name] DOB: [birth year] MRN: [id number]   Airway Documentation:    Vent end date: 07/05/19 Vent end time: 0852   Evaluation  O2 sats: stable throughout Complications: No apparent complications Patient did tolerate procedure well. Bilateral Breath Sounds: Clear   Yes   Patient extubated to 3L nasal cannula per MD order.  Positive cuff leak noted.  No evidence of stridor.  Patient able to speak post extubation.  Sats currently 100%.  Vitals are stable.  No complications noted.    Judith Part 07/05/2019, 8:56 AM

## 2019-07-05 NOTE — Progress Notes (Signed)
NAME:  [name], MRN:  [id number], DOB:  10-21-1945, LOS: 1 ADMISSION DATE:  07/04/2019, CONSULTATION DATE:  07/04/19 REFERRING MD:  Christella Noa  CHIEF COMPLAINT:  Vent Management   Brief History   [name] is a 74 y.o. male who was admitted 3/16 for elective right occipital craniotomy for tumor removal.  Had recent admission 3/13 through 3/15 for headaches, fevers, N/V and was found to have brain mass.  History of present illness   Pt is encephelopathic; therefore, this HPI is obtained from chart review. [name] is a 74 y.o. male who has a PMH including but not limited to prostate CA s/p XRT, DM, CAD, HTN (see "past medical history" for rest).   He had recent admission 07/01/19 through 07/03/19 for headaches, fevers, nausea, vomiting.  He was found to have right occipital brain mass.  He presented 3/16 for right occipital craniotomy for tumor removal.  Past Medical History  has ADENOCARCINOMA, PROSTATE; HYPERLIPIDEMIA; TOBACCO ABUSE; ATHEROSCLEROTIC CARDIOVASCULAR DISEASE; INTERMITTENT VERTIGO; ABDOMINAL PAIN -GENERALIZED; Atherosclerosis of native artery of extremity with intermittent claudication ([place]); Spinal stenosis of lumbar region with neurogenic claudication; TIA (transient ischemic attack); ASCVD (arteriosclerotic cardiovascular disease); Diabetes mellitus without complication ([place]); Resistant hypertension; Hyperlipidemia; Carotid stenosis, right; Carotid artery stenosis, symptomatic; S/P carotid endarterectomy; Essential hypertension; Type 2 diabetes mellitus with circulatory disorder ([place]); HLD (hyperlipidemia); History of coronary artery bypass graft; Biochemically recurrent malignant neoplasm of prostate; Atherosclerosis of native artery of both lower extremities with intermittent claudication ([place]); Coronary artery disease without angina pectoris; Dyspnea on exertion; Brain mass; Fever; S/P craniotomy; Brain tumor, 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  Significant Hospital Events   3/16 > admit.  Consults:   Procedures:  ETT 3/16 > 3/17  Significant Diagnostic Tests:  CT head and brain MRI 3/14 > right occipital mass.  Micro Data:  COVID 3/16 > neg.  Antimicrobials:  None.   Interim history/subjective:   No events overnight Afebrile    Objective:  Blood pressure 133/75, pulse 74, temperature 98.6 F (37 C), temperature source Axillary, resp. rate 13, height 6\' 2"  (1.88 m), weight 107.5 kg, SpO2 100 %.    Vent Mode: PSV;CPAP FiO2 (%):  [40 %-100 %] 40 % Set Rate:  [12 bmp] 12 bmp Vt Set:  [650 mL] 650 mL PEEP:  [5 cmH20] 5 cmH20 Pressure Support:  [5 cmH20] 5 cmH20 Plateau Pressure:  [18 cmH20-19 cmH20] 18 cmH20   Intake/Output Summary (Last 24 hours) at 07/05/2019 0849 Last data filed at 07/05/2019 0800 Gross per 24 hour  Intake 3337.51 ml  Output 1670 ml  Net 1667.51 ml   Filed Weights   07/04/19 1007  Weight: 107.5 kg    Examination: General: Adult male, in NAD. Neuro: Follows commands of propofol, moving all 4 extremities HEENT: [place]/AT. Sclerae anicteric.  Craniotomy site without bleeding. Cardiovascular: RRR, no M/R/G.  Lungs: Respirations even and unlabored.  CTA bilaterally, No W/R/R.  Abdomen: BS x 4, soft, NT/ND.  Musculoskeletal: No gross deformities, no edema.  Skin: Intact, warm, no rashes.  Chest x-ray 3/17 personally reviewed which shows no infiltrates or effusions.  Labs show mild hyponatremia, hyperkalemia, no leukocytosis  Assessment & Plan:   Right occipital mass - s/p resection 3/16. - Post op care per neurosurgery, changed - Continue empiric decadron, keppra.  Respiratory insufficiency post procedure- 2/2 above. -Tolerates pressure support weaning, proceed with extubation  Hx DM - diet controlled (had previously been on meds). -  SSI if glucose consistently > 180.  Hx CAD, HTN. - Continue home lipitor. - Hold home pletal, ASA,  norvasc, losartan-HCTZ, toprol.  Hx prostate CA - s/p XRT. - F/u with oncology as an outpatient.  Mild hyperkalemia-DC IV fluids with potassium Resume p.o. when able  Best Practice:  Diet: NPO. Pain/Anxiety/Delirium protocol (if indicated): Morphine as needed pain VAP protocol (if indicated): In place. DVT prophylaxis: SCD's. GI prophylaxis: PPI. Glucose control: SSI if glucose consistently > 180. Mobility: Bedrest. Code Status: Full. Family Communication: Wife updated at bedside. Disposition: ICU.   PCCM will be available as needed  The patient is critically ill with multiple organ systems failure and requires high complexity decision making for assessment and support, frequent evaluation and titration of therapies, application of advanced monitoring technologies and extensive interpretation of multiple databases. Critical Care Time devoted to patient care services described in this note independent of APP/resident  time is 31 minutes.    Kara Mead MD. Shade Flood. [facility]  If no response to pager , please call [phone]   07/05/2019

## 2019-07-05 NOTE — Progress Notes (Signed)
Pt taken from ICU room to CT then returned to ICU room without any complications.

## 2019-07-05 NOTE — Progress Notes (Signed)
Patient ID: [name], male   DOB: 1946-02-16, 74 y.o.   MRN: [id number] BP (!) 149/85   Pulse 65   Temp 97.8 F (36.6 C) (Oral)   Resp 11   Ht 6\' 2"  (1.88 m)   Wt 107.5 kg   SpO2 94%   BMI 30.43 kg/m  Alert and oriented x 4, speech is clear and fluent Left homonymous hemianopsia Wound is clean,dry, no signs of infection Head ct shows biopsy site, no other abnormalities Will transfer to floor

## 2019-07-06 ENCOUNTER — Other Ambulatory Visit: Payer: Self-pay | Admitting: Cardiology

## 2019-07-06 DIAGNOSIS — G9389 Other specified disorders of brain: Secondary | ICD-10-CM

## 2019-07-06 LAB — GLUCOSE, CAPILLARY
Glucose-Capillary: 106 mg/dL — ABNORMAL HIGH (ref 70–99)
Glucose-Capillary: 135 mg/dL — ABNORMAL HIGH (ref 70–99)
Glucose-Capillary: 141 mg/dL — ABNORMAL HIGH (ref 70–99)
Glucose-Capillary: 159 mg/dL — ABNORMAL HIGH (ref 70–99)
Glucose-Capillary: 225 mg/dL — ABNORMAL HIGH (ref 70–99)
Glucose-Capillary: 249 mg/dL — ABNORMAL HIGH (ref 70–99)

## 2019-07-06 LAB — SURGICAL PATHOLOGY

## 2019-07-06 MED ORDER — PANTOPRAZOLE SODIUM 40 MG PO TBEC
40.0000 mg | DELAYED_RELEASE_TABLET | Freq: Every day | ORAL | Status: DC
  Administered 2019-07-06: 40 mg via ORAL
  Filled 2019-07-06: qty 1

## 2019-07-06 NOTE — Plan of Care (Signed)
  Problem: Safety: Goal: Ability to remain free from injury will improve Outcome: Progressing   

## 2019-07-06 NOTE — Consult Note (Signed)
[facility] Consult Note  Patient Care Team: Maurice Small, MD as PCP - General (Family Medicine) Cira Rue, RN Nurse Navigator as Registered Nurse (Medical Oncology)  CHIEF COMPLAINTS/PURPOSE OF CONSULTATION:  Brain Tumor  HISTORY OF PRESENTING ILLNESS:  [name] 74 y.o. male presented with several days of progressive left sided visual impairment.  He noticed "missing things" in the left side of his field in his work and while driving.  He then developed headaches which were new for him.  Never complained of difficulty walking, speaking, using arms or hands.  CNS imaging demonstrated tumor within right occipital lobe, which was biopsied by Dr. Christella Noa on 07/04/19.  Following surgery, he has no new complaints or progressive deficits.  Works at [employer] and lives with his wife of 101 years.  MEDICAL HISTORY:  Past Medical History:  Diagnosis Date  . Anxiety   . Arthritis   . ASCVD (arteriosclerotic cardiovascular disease)    03/2004: DES x2 to the RCA; IMI with stent occlusion in 02/2005- suboptimal Plavix compliance  . CAD (coronary artery disease)   . Carcinoma of prostate ([place])    Clopidogrel held for biopsy  . Constipation due to pain medication   . Diabetes mellitus without complication ([place])   . Diabetic neuropathy ([place])   . Headache 2021  . Hyperlipidemia   . Hypertension   . Mild depression ([place])   . Morbid obesity ([place])   . Myocardial infarction ([place])   . Shortness of breath dyspnea   . Sleep apnea    no longer uses cpap  . Stroke ([place]) 2017  . Tobacco abuse    stopped smoking 06/28/09    SURGICAL HISTORY: Past Surgical History:  Procedure Laterality Date  . APPLICATION OF CRANIAL NAVIGATION N/A 07/04/2019   Procedure: APPLICATION OF CRANIAL NAVIGATION;  Surgeon: Ashok Pall, MD;  Location: [place];  Service: Neurosurgery;  Laterality: N/A;  . BACK SURGERY    . CARDIAC CATHETERIZATION     X 1 stent before having CABG  .  CORONARY ARTERY BYPASS GRAFT  06/28/2008   X4  . CRANIOTOMY Right 07/04/2019   Procedure: Right occipital craniotomy for tumor with brainlab;  Surgeon: Ashok Pall, MD;  Location: [place];  Service: Neurosurgery;  Laterality: Right;  . ENDARTERECTOMY Right 07/03/2015   Procedure: RIGHT CAROTID ENDARTERECTOMY WITH BOVINE PERICARDIUM PATCH ANGIOPLASTY;  Surgeon: Serafina Mitchell, MD;  Location: [facility];  Service: Vascular;  Laterality: Right;  . LUMBAR LAMINECTOMY/DECOMPRESSION MICRODISCECTOMY N/A 09/06/2014   Procedure: LUMBER DECOMPRESSION L3-5;  Surgeon: Melina Schools, MD;  Location: [place];  Service: Orthopedics;  Laterality: N/A;  . LUMBAR SPINE SURGERY  2002  . PROSTATE BIOPSY    . PROSTATECTOMY  02/2007  . VASECTOMY      SOCIAL HISTORY: Social History   Socioeconomic History  . Marital status: Married    Spouse name: Not on file  . Number of children: 4  . Years of education: Not on file  . Highest education level: Not on file  Occupational History  . Occupation: Technical brewer    Comment: retired  . Occupation: Designer, industrial/product: [employer]    Comment: full time  Tobacco Use  . Smoking status: Former Smoker    Packs/day: 1.00    Years: 40.00    Pack years: 40.00    Types: Cigarettes    Quit date: 06/28/2008    Years since quitting: 11.0  . Smokeless tobacco: Former  User    Quit date: 06/28/2009  Substance and Sexual Activity  . Alcohol use: Yes    Alcohol/week: 3.0 standard drinks    Types: 2 Glasses of wine, 1 Cans of beer per week    Comment: may have 1 drink a week  . Drug use: No  . Sexual activity: Not on file  Other Topics Concern  . Not on file  Social History Narrative   Married with 3 sons and 1 daughter   Daily caffeine use, 3 per day   Social Determinants of Health   Financial Resource Strain:   . Difficulty of Paying Living Expenses:   Food Insecurity:   . Worried About Charity fundraiser in the Last Year:   . Arboriculturist in  the Last Year:   Transportation Needs:   . Film/video editor (Medical):   Marland Kitchen Lack of Transportation (Non-Medical):   Physical Activity:   . Days of Exercise per Week:   . Minutes of Exercise per Session:   Stress:   . Feeling of Stress :   Social Connections:   . Frequency of Communication with Friends and Family:   . Frequency of Social Gatherings with Friends and Family:   . Attends Religious Services:   . Active Member of Clubs or Organizations:   . Attends Archivist Meetings:   Marland Kitchen Marital Status:   Intimate Partner Violence:   . Fear of Current or Ex-Partner:   . Emotionally Abused:   Marland Kitchen Physically Abused:   . Sexually Abused:     FAMILY HISTORY: Family History  Problem Relation Age of Onset  . Kidney disease Mother   . Stroke Father   . Breast cancer Sister        breast progressed into bone  . Colon cancer Neg Hx   . Prostate cancer Neg Hx   . Pancreatic cancer Neg Hx     ALLERGIES:  is allergic to lisinopril.  MEDICATIONS:  Current Facility-Administered Medications  Medication Dose Route Frequency Provider Last Rate Last Admin  . chlorhexidine gluconate (MEDLINE KIT) (PERIDEX) 0.12 % solution 15 mL  15 mL Mouth Rinse BID Ashok Pall, MD   15 mL at 07/06/19 0956  . Chlorhexidine Gluconate Cloth 2 % PADS 6 each  6 each Topical Daily Ashok Pall, MD      . dexamethasone (DECADRON) injection 4 mg  4 mg Intravenous Q6H Ashok Pall, MD   4 mg at 07/06/19 0932   Followed by  . dexamethasone (DECADRON) injection 4 mg  4 mg Intravenous Q8H Ashok Pall, MD      . HYDROcodone-acetaminophen (NORCO/VICODIN) 5-325 MG per tablet 1 tablet  1 tablet Oral Q4H PRN Ashok Pall, MD   1 tablet at 07/05/19 2345  . insulin aspart (novoLOG) injection 0-20 Units  0-20 Units Subcutaneous Q4H Ashok Pall, MD   3 Units at 07/06/19 [phone]  . morphine 2 MG/ML injection 1-2 mg  1-2 mg Intravenous Q2H PRN Ashok Pall, MD   2 mg at 07/05/19 1558  . naloxone (NARCAN)  injection 0.08 mg  0.08 mg Intravenous PRN Ashok Pall, MD      . ondansetron (ZOFRAN) tablet 4 mg  4 mg Oral Q4H PRN Ashok Pall, MD       Or  . ondansetron (ZOFRAN) injection 4 mg  4 mg Intravenous Q4H PRN Ashok Pall, MD      . pantoprazole (PROTONIX) EC tablet 40 mg  40 mg Oral QHS Ashok Pall,  MD        REVIEW OF SYSTEMS:   Constitutional: Denies fevers, chills or abnormal weight loss Eyes: Denies blurriness of vision Ears, nose, mouth, throat, and face: Denies mucositis or sore throat Respiratory: Denies cough, dyspnea or wheezes Cardiovascular: Denies palpitation, chest discomfort or lower extremity swelling Gastrointestinal:  Denies nausea, constipation, diarrhea GU: Denies dysuria or incontinence Skin: Denies abnormal skin rashes Neurological: Per HPI Musculoskeletal: Denies joint pain, back or neck discomfort. No decrease in ROM Behavioral/Psych: Denies anxiety, disturbance in thought content, and mood instability   PHYSICAL EXAMINATION: Vitals:   07/06/19 0355 07/06/19 0742  BP: (!) 142/56 (!) 156/81  Pulse: (!) 58 63  Resp: 18 16  Temp: 98.1 F (36.7 C) 98 F (36.7 C)  SpO2: 92% 92%   KPS: 70. General: Alert, cooperative, pleasant, in no acute distress Head: Craniotomy scar noted, dry and intact. EENT: No conjunctival injection or scleral icterus. Oral mucosa moist Lungs: Resp effort normal Cardiac: Regular rate and rhythm Abdomen: Soft, non-distended abdomen Skin: No rashes cyanosis or petechiae. Extremities: No clubbing or edema  NEUROLOGIC EXAM: Mental Status: Awake, alert, attentive to examiner. Oriented to self and environment. Language is fluent with intact comprehension.  Cranial Nerves: Visual acuity is grossly normal. Left homonymous hemianopia. Extra-ocular movements intact. No ptosis. Face is symmetric, tongue midline. Motor: Tone and bulk are normal. Power is full in both arms and legs. Reflexes are symmetric, no pathologic reflexes  present. Intact finger to nose bilaterally Sensory: Intact to light touch and temperature Gait: Deferred  LABORATORY DATA:  I have reviewed the data as listed Lab Results  Component Value Date   WBC 8.4 07/05/2019   HGB 10.6 (L) 07/05/2019   HCT 31.3 (L) 07/05/2019   MCV 89.4 07/05/2019   PLT 192 07/05/2019   Recent Labs    08/24/18 0809 08/24/18 0809 05/04/19 0954 05/04/19 0954 07/01/19 1801 07/04/19 1831 07/05/19 0340  NA 141   < > 139  --  133* 135 133*  K 4.7   < > 4.4   < > 4.4 4.4 5.0  CL 102   < > 103  --  98  --  97*  CO2 25   < > 25  --  25  --  24  GLUCOSE 115*   < > 117*  --  151*  --  162*  BUN 23   < > 11  --  13  --  26*  CREATININE 0.96   < > 0.80  --  0.95  --  0.98  CALCIUM 9.7   < > 9.3  --  9.0  --  8.3*  GFRNONAA 79   < > 89  --  >60  --  >60  GFRAA 91   < > 102  --  >60  --  >60  PROT 7.1  --  6.8  --  7.1  --   --   ALBUMIN 4.5  --  4.5  --  4.1  --   --   AST 22  --  22  --  16  --   --   ALT 22  --  16  --  13  --   --   ALKPHOS 76  --  80  --  61  --   --   BILITOT 0.7  --  0.7  --  1.1  --   --    < > = values in this interval not displayed.  RADIOGRAPHIC STUDIES: I have personally reviewed the radiological images as listed and agreed with the findings in the report. CT HEAD WO CONTRAST  Result Date: 07/05/2019 CLINICAL DATA:  Stroke follow-up EXAM: CT HEAD WITHOUT CONTRAST TECHNIQUE: Contiguous axial images were obtained from the base of the skull through the vertex without intravenous contrast. COMPARISON:  Four days ago FINDINGS: Brain: Interval right occipital surgery with expected minimal blood products and gas. The degree of presumed edema is unchanged when compared to MRI. No hydrocephalus or mass effect. Vascular: No hyperdense vessel or unexpected calcification. Skull: Unremarkable right occipital craniotomy. Sinuses/Orbits: Negative IMPRESSION: No unexpected finding after right occipital surgery. Electronically Signed   By: Monte Fantasia M.D.   On: 07/05/2019 06:33   CT Head Wo Contrast  Result Date: 07/01/2019 CLINICAL DATA:  Dizziness, weakness, visual disturbances EXAM: CT HEAD WITHOUT CONTRAST TECHNIQUE: Contiguous axial images were obtained from the base of the skull through the vertex without intravenous contrast. COMPARISON:  06/18/2015 FINDINGS: Brain: There is a hypodense lesion of the right occipital lobe with minimal associated subcortical hemorrhage (series 3, image 18). Periventricular white matter hypodensity. Vascular: No hyperdense vessel or unexpected calcification. Skull: Normal. Negative for fracture or focal lesion. Sinuses/Orbits: No acute finding. Other: None. IMPRESSION: Hypodense lesion of the right occipital lobe with minimal associated subcortical hemorrhage, most consistent with acute to subacute infarction and minimal hemorrhagic transformation. No significant mass effect. Mass or metastatic lesion is a differential consideration. Consider contrast enhanced MRI to further evaluate. Electronically Signed   By: Eddie Candle M.D.   On: 07/01/2019 18:24   [place] CT Head W Contrast  Result Date: 07/02/2019 CLINICAL DATA:  Right occipital lobe mass. Surgical planning. EXAM: CT HEAD WITH CONTRAST TECHNIQUE: Contiguous axial images were obtained from the base of the skull through the vertex with intravenous contrast. CONTRAST:  163m OMNIPAQUE IOHEXOL 300 MG/ML  SOLN COMPARISON:  MR head without and with contrast and CT head without contrast 07/01/2019 FINDINGS: Brain: Peripherally enhancing right occipital mass is again noted. The lesion measures 2.6 x 1.8 x 3.3 cm. Enhancing material extends to the ventricle. No new lesions are present. Surrounding edema is again seen. The ventricles are of normal size. No significant extraaxial fluid collection is present. Vascular: Atherosclerotic changes are present within the cavernous internal carotid arteries and at the dural margin of the right vertebral artery. Skull:  Calvarium is intact. No focal lytic or blastic lesions are present. No significant extracranial soft tissue lesion is present. Sinuses/Orbits: The paranasal sinuses and mastoid air cells are clear. The globes and orbits are within normal limits. IMPRESSION: 1. Stable appearance of peripherally enhancing right occipital mass measuring 2.6 x 1.8 x 3.3 cm. This remains concerning for a primary brain neoplasm. 2. Enhancing material extends to the ventricle. Electronically Signed   By: CSan MorelleM.D.   On: 07/02/2019 15:57   MR ANGIO HEAD WO CONTRAST  Result Date: 07/01/2019 CLINICAL DATA:  Headache EXAM: MRI HEAD WITHOUT AND WITH CONTRAST MRA HEAD WITHOUT CONTRAST MRA NECK WITHOUT AND WITH CONTRAST TECHNIQUE: Multiplanar, multiecho pulse sequences of the brain and surrounding structures were obtained without and with intravenous contrast. Angiographic images of the Circle of Willis were obtained using MRA technique without intravenous contrast. Angiographic images of the neck were obtained using MRA technique without and with intravenous contrast. Carotid stenosis measurements (when applicable) are obtained utilizing NASCET criteria, using the distal internal carotid diameter as the denominator. CONTRAST:  1103mGADAVIST GADOBUTROL 1 MMOL/ML IV SOLN  COMPARISON:  Head CT 07/01/2019 FINDINGS: MRI HEAD FINDINGS Brain: Diffusion abnormality in the right occipital lobe at the site of lesion identified on earlier CT. There are associated magnetic susceptibility effects due to known hemorrhage. There is moderate edema in the right occipital lobe that surround contrast-enhancing lesion, which measures 4.8 x 1.9 cm and extends to the border of the right lateral ventricle occipital horn. There is hemosiderin deposition throughout the lesion. There are no other contrast-enhancing lesions. There is no midline shift. No hydrocephalus. Vascular: Normal flow voids Skull and upper cervical spine: Normal marrow signal  Sinuses/Orbits: Negative. Other: None MRA HEAD FINDINGS POSTERIOR CIRCULATION: --Vertebral arteries: Normal V4 segments. --Posterior inferior cerebellar arteries (PICA): Patent origins from the vertebral arteries. --Anterior inferior cerebellar arteries (AICA): Patent origins from the basilar artery. --Basilar artery: Normal. --Superior cerebellar arteries: Normal. --Posterior cerebral arteries: Normal. Both are predominantly supplied by the posterior communicating arteries (p-comm). ANTERIOR CIRCULATION: --Intracranial internal carotid arteries: Normal. --Anterior cerebral arteries (ACA): Normal. Both A1 segments are present. Patent anterior communicating artery (a-comm). --Middle cerebral arteries (MCA): Normal. MRA NECK FINDINGS Normal aortic branch pattern. Right subclavian artery visualized portion is normal. There is no appreciable flow related enhancement within the distal left subclavian artery. There is mild stenosis at the left carotid bifurcation with a short segment enhancement defect of the proximal external carotid artery. Both internal carotid arteries are normal. The vertebral arteries are codominant and patent along their entire visualized course. A short portion of the V3 segments is not visualized due to field of view constraints. IMPRESSION: 1. 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 2. Normal intracranial MRA. 3. Mild stenosis of the left carotid bifurcation but otherwise no hemodynamically significant stenosis of the internal carotid or vertebral arteries. 4. Loss of enhancement within the distal left subclavian artery could indicate high-grade stenosis or occlusion. Electronically Signed   By: Ulyses Jarred M.D.   On: 07/01/2019 22:36   MR Angiogram Neck W or Wo Contrast  Result Date: 07/01/2019 CLINICAL DATA:  Headache EXAM: MRI HEAD WITHOUT AND WITH CONTRAST MRA HEAD WITHOUT CONTRAST  MRA NECK WITHOUT AND WITH CONTRAST TECHNIQUE: Multiplanar, multiecho pulse sequences of the brain and surrounding structures were obtained without and with intravenous contrast. Angiographic images of the Circle of Willis were obtained using MRA technique without intravenous contrast. Angiographic images of the neck were obtained using MRA technique without and with intravenous contrast. Carotid stenosis measurements (when applicable) are obtained utilizing NASCET criteria, using the distal internal carotid diameter as the denominator. CONTRAST:  65m GADAVIST GADOBUTROL 1 MMOL/ML IV SOLN COMPARISON:  Head CT 07/01/2019 FINDINGS: MRI HEAD FINDINGS Brain: Diffusion abnormality in the right occipital lobe at the site of lesion identified on earlier CT. There are associated magnetic susceptibility effects due to known hemorrhage. There is moderate edema in the right occipital lobe that surround contrast-enhancing lesion, which measures 4.8 x 1.9 cm and extends to the border of the right lateral ventricle occipital horn. There is hemosiderin deposition throughout the lesion. There are no other contrast-enhancing lesions. There is no midline shift. No hydrocephalus. Vascular: Normal flow voids Skull and upper cervical spine: Normal marrow signal Sinuses/Orbits: Negative. Other: None MRA HEAD FINDINGS POSTERIOR CIRCULATION: --Vertebral arteries: Normal V4 segments. --Posterior inferior cerebellar arteries (PICA): Patent origins from the vertebral arteries. --Anterior inferior cerebellar arteries (AICA): Patent origins from the basilar artery. --Basilar artery: Normal. --Superior cerebellar arteries: Normal. --Posterior cerebral arteries:  Normal. Both are predominantly supplied by the posterior communicating arteries (p-comm). ANTERIOR CIRCULATION: --Intracranial internal carotid arteries: Normal. --Anterior cerebral arteries (ACA): Normal. Both A1 segments are present. Patent anterior communicating artery (a-comm).  --Middle cerebral arteries (MCA): Normal. MRA NECK FINDINGS Normal aortic branch pattern. Right subclavian artery visualized portion is normal. There is no appreciable flow related enhancement within the distal left subclavian artery. There is mild stenosis at the left carotid bifurcation with a short segment enhancement defect of the proximal external carotid artery. Both internal carotid arteries are normal. The vertebral arteries are codominant and patent along their entire visualized course. A short portion of the V3 segments is not visualized due to field of view constraints. IMPRESSION: 1. 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 2. Normal intracranial MRA. 3. Mild stenosis of the left carotid bifurcation but otherwise no hemodynamically significant stenosis of the internal carotid or vertebral arteries. 4. Loss of enhancement within the distal left subclavian artery could indicate high-grade stenosis or occlusion. Electronically Signed   By: Ulyses Jarred M.D.   On: 07/01/2019 22:36   MR BRAIN W WO CONTRAST  Result Date: 07/01/2019 CLINICAL DATA:  Headache EXAM: MRI HEAD WITHOUT AND WITH CONTRAST MRA HEAD WITHOUT CONTRAST MRA NECK WITHOUT AND WITH CONTRAST TECHNIQUE: Multiplanar, multiecho pulse sequences of the brain and surrounding structures were obtained without and with intravenous contrast. Angiographic images of the Circle of Willis were obtained using MRA technique without intravenous contrast. Angiographic images of the neck were obtained using MRA technique without and with intravenous contrast. Carotid stenosis measurements (when applicable) are obtained utilizing NASCET criteria, using the distal internal carotid diameter as the denominator. CONTRAST:  79m GADAVIST GADOBUTROL 1 MMOL/ML IV SOLN COMPARISON:  Head CT 07/01/2019 FINDINGS: MRI HEAD FINDINGS Brain: Diffusion abnormality in the  right occipital lobe at the site of lesion identified on earlier CT. There are associated magnetic susceptibility effects due to known hemorrhage. There is moderate edema in the right occipital lobe that surround contrast-enhancing lesion, which measures 4.8 x 1.9 cm and extends to the border of the right lateral ventricle occipital horn. There is hemosiderin deposition throughout the lesion. There are no other contrast-enhancing lesions. There is no midline shift. No hydrocephalus. Vascular: Normal flow voids Skull and upper cervical spine: Normal marrow signal Sinuses/Orbits: Negative. Other: None MRA HEAD FINDINGS POSTERIOR CIRCULATION: --Vertebral arteries: Normal V4 segments. --Posterior inferior cerebellar arteries (PICA): Patent origins from the vertebral arteries. --Anterior inferior cerebellar arteries (AICA): Patent origins from the basilar artery. --Basilar artery: Normal. --Superior cerebellar arteries: Normal. --Posterior cerebral arteries: Normal. Both are predominantly supplied by the posterior communicating arteries (p-comm). ANTERIOR CIRCULATION: --Intracranial internal carotid arteries: Normal. --Anterior cerebral arteries (ACA): Normal. Both A1 segments are present. Patent anterior communicating artery (a-comm). --Middle cerebral arteries (MCA): Normal. MRA NECK FINDINGS Normal aortic branch pattern. Right subclavian artery visualized portion is normal. There is no appreciable flow related enhancement within the distal left subclavian artery. There is mild stenosis at the left carotid bifurcation with a short segment enhancement defect of the proximal external carotid artery. Both internal carotid arteries are normal. The vertebral arteries are codominant and patent along their entire visualized course. A short portion of the V3 segments is not visualized due to field of view constraints. IMPRESSION: 1. 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 2. Normal intracranial  MRA. 3. Mild stenosis of the left carotid bifurcation but otherwise no hemodynamically significant stenosis of the internal carotid or vertebral arteries. 4. Loss of enhancement within the distal left subclavian artery could indicate high-grade stenosis or occlusion. Electronically Signed   By: Ulyses Jarred M.D.   On: 07/01/2019 22:36   Portable Chest xray  Result Date: 07/05/2019 CLINICAL DATA:  RIGHT occipital mass. EXAM: PORTABLE CHEST 1 VIEW COMPARISON:  Chest radiograph 07/04/2019 FINDINGS: Endotracheal tube unchanged. Normal cardiac silhouette. Midline sternotomy. Low lung volumes. No effusion, infiltrate pneumothorax. IMPRESSION: 1. Stable support apparatus. 2. No interval change. 3. No pulmonary edema.  Low lung volumes. Electronically Signed   By: Suzy Bouchard M.D.   On: 07/05/2019 08:29   Portable Chest x-ray  Result Date: 07/04/2019 CLINICAL DATA:  The tracheal tube placement EXAM: PORTABLE CHEST 1 VIEW COMPARISON:  July 01, 2019 FINDINGS: The endotracheal tube terminates approximately 3.6 cm above the carina. There is no pneumothorax. The heart size is mildly enlarged. The patient is status post prior median sternotomy. Aortic calcifications are noted. There are probable trace bilateral pleural effusions. There is no acute osseous abnormality. IMPRESSION: 1. Endotracheal tube as above. 2. Probable trace bilateral pleural effusions, otherwise stable appearance of the chest. Electronically Signed   By: Constance Holster M.D.   On: 07/04/2019 19:16   DG Chest Port 1 View  Result Date: 07/01/2019 CLINICAL DATA:  Fever and dizziness. EXAM: PORTABLE CHEST 1 VIEW COMPARISON:  February 16, 2007 FINDINGS: Multiple sternal wires are seen. This represents a new finding when compared to the prior study. There is no evidence of acute infiltrate, pleural effusion or pneumothorax. The heart size and mediastinal contours are  within normal limits. There is tortuosity of the descending thoracic aorta. Degenerative changes seen throughout the thoracic spine. IMPRESSION: 1. Interval median sternotomy since the prior study dated February 16, 2007. 2. No acute or active cardiopulmonary disease. Electronically Signed   By: Virgina Norfolk M.D.   On: 07/01/2019 17:48    ASSESSMENT & PLAN:  Right Occipital Brain Mass  Preliminary histology was discussed with Dr. Lyndon Code, consistent with high grade glioma or glioblastoma.    Today we briefly discussed his diagnosis, prognosis and likely treatment options, including radiation and chemotherapy.  More detailed discussion will occur next week in the brain tumor clinic with family present.   5-7 day dexamethasone taper is likely appropriate, will defer to Dr. Christella Noa regarding specific post-op instructions.  We encouraged him to focus on recovery from surgery and keep his spirits up despite the difficult news delivered today.    Further discussion regarding his case will occur within brain/spine tumor board conference next week.  All questions were answered.  Contact information was provided; the patient knows to call the clinic with any problems, questions or concerns.  We will call him to schedule his follow up visit next 1-2 weeks.  The total time spent in the encounter was 55 minutes and more than 50% was on counseling and review of test results     Ventura Sellers, MD 07/06/2019 11:56 AM

## 2019-07-06 NOTE — Evaluation (Signed)
Physical Therapy Evaluation Patient Details Name: [name] MRN: [id number] DOB: 30-Mar-1946 Today's Date: 07/06/2019   History of Present Illness  [name] is a 74 y.o. male who was admitted 3/16 for elective right occipital craniotomy for tumor removal.  Had recent admission 3/13 through 3/15 for headaches, fevers, N/V and was found to have brain mass. Significant PMH of prostate CA s/p XRT, DM, CAD, HTN.    Clinical Impression  Prior to admission, pt lives with his wife and works part time at [employer]. Pt reporting "blurred" vision and difficulty concentrating. Noted L temporal visual field deficit on examination. Ambulating hallway distances with no assistive device at a supervision level. Able to locate signage with increased time. Negotiated 5 steps with railings. Due to decreased gait speed and mild balance deficits, recommended use of cane for unlevel surfaces. Don't anticipate need for PT follow up, will follow acutely.    Follow Up Recommendations No PT follow up;Supervision/Assistance - 24 hour    Equipment Recommendations  None recommended by PT    Recommendations for Other Services   OT consult.    Precautions / Restrictions Precautions Precautions: Fall Restrictions Weight Bearing Restrictions: No      Mobility  Bed Mobility Overal bed mobility: Independent                Transfers Overall transfer level: Independent Equipment used: None                Ambulation/Gait Ambulation/Gait assistance: Supervision Gait Distance (Feet): 150 Feet Assistive device: None Gait Pattern/deviations: Step-through pattern;Decreased stride length Gait velocity: decreased   General Gait Details: Slow pace, no gross imbalance noted  Stairs Stairs: Yes Stairs assistance: Supervision Stair Management: Two rails Number of Stairs: 5 General stair comments: Step over step pattern  Wheelchair Mobility    Modified Rankin (Stroke Patients  Only)       Balance Overall balance assessment: Mild deficits observed, not formally tested                                           Pertinent Vitals/Pain Pain Assessment: Faces Faces Pain Scale: Hurts a little bit Pain Location: headache Pain Intervention(s): Monitored during session    Home Living Family/patient expects to be discharged to:: Private residence Living Arrangements: Spouse/significant other Available Help at Discharge: Family;Available 24 hours/day Type of Home: House Home Access: Stairs to enter   [employer] of Steps: 2 Home Layout: Two level Home Equipment: Cane - single point      Prior Function Level of Independence: Independent         Comments: Works part time at [employer]        Extremity/Trunk Assessment   Upper Extremity Assessment Upper Extremity Assessment: Overall WFL for tasks assessed    Lower Extremity Assessment Lower Extremity Assessment: Overall WFL for tasks assessed    Cervical / Trunk Assessment Cervical / Trunk Assessment: Normal  Communication   Communication: No difficulties  Cognition Arousal/Alertness: Awake/alert Behavior During Therapy: WFL for tasks assessed/performed Overall Cognitive Status: Within Functional Limits for tasks assessed                                        General Comments  Exercises     Assessment/Plan    PT Assessment Patient needs continued PT services  PT Problem List Decreased balance;Decreased mobility       PT Treatment Interventions Gait training;Stair training;Functional mobility training;Therapeutic activities;Therapeutic exercise;Balance training;Patient/family education    PT Goals (Current goals can be found in the Care Plan section)  Acute Rehab PT Goals Patient Stated Goal: "for vision to improve." PT Goal Formulation: With patient Time For Goal Achievement: 07/20/19 Potential to  Achieve Goals: Good    Frequency Min 4X/week   Barriers to discharge        Co-evaluation               AM-PAC PT "6 Clicks" Mobility  Outcome Measure Help needed turning from your back to your side while in a flat bed without using bedrails?: None Help needed moving from lying on your back to sitting on the side of a flat bed without using bedrails?: None Help needed moving to and from a bed to a chair (including a wheelchair)?: None Help needed standing up from a chair using your arms (e.g., wheelchair or bedside chair)?: None Help needed to walk in hospital room?: None Help needed climbing 3-5 steps with a railing? : None 6 Click Score: 24    End of Session   Activity Tolerance: Patient tolerated treatment well Patient left: in bed;with call bell/phone within reach;with bed alarm set Nurse Communication: Mobility status PT Visit Diagnosis: Unsteadiness on feet (R26.81);Difficulty in walking, not elsewhere classified (R26.2)    Time: [id number] PT Time Calculation (min) (ACUTE ONLY): 19 min   Charges:   PT Evaluation $PT Eval Moderate Complexity: 1 Mod            Wyona Almas, PT, DPT Acute Rehabilitation Services Pager [phone] Office [phone]   Deno Etienne 07/06/2019, 10:10 AM

## 2019-07-06 NOTE — Evaluation (Signed)
Occupational Therapy Evaluation Patient Details Name: [name] MRN: [id number] DOB: 03-Aug-1945 Today's Date: 07/06/2019    History of Present Illness [name] is a 74 y.o. male who was admitted 3/16 for elective right occipital craniotomy for tumor removal.  Had recent admission 3/13 through 3/15 for headaches, fevers, N/V and was found to have brain mass. Significant PMH of prostate CA s/p XRT, DM, CAD, HTN.     Clinical Impression   This 74 yo male admitted and underwent above presents to acute OT with PLOF independent with basic ADLs, working, and driving. Currently he is S for all basic ADLs due to vision deficit (left homonymous hemianopsia). He has been advised not to drive. He will continue to benefit from acute OT with follow up OPOT. We will continue to follow.    Follow Up Recommendations  Outpatient OT(for vision)    Equipment Recommendations  None recommended by OT       Precautions / Restrictions Precautions Precautions: Fall Restrictions Weight Bearing Restrictions: No      Mobility Bed Mobility Overal bed mobility: Independent                Transfers Overall transfer level: Independent Equipment used: None                  Balance Overall balance assessment: Mild deficits observed, not formally tested                                         ADL either performed or assessed with clinical judgement   ADL                                         General ADL Comments: Overall needs S due to decreasd vision     Vision Baseline Vision/History: Wears glasses Wears Glasses: At all times Patient Visual Report: Blurring of vision;Peripheral vision impairment Vision Assessment?: Yes Eye Alignment: Within Functional Limits Ocular Range of Motion: Within Functional Limits Alignment/Gaze Preference: Within Defined Limits Tracking/Visual Pursuits: Able to track stimulus in all quads without  difficulty(but looses sight of object to left) Convergence: Within functional limits Visual Fields: Left homonymous hemianopsia            Pertinent Vitals/Pain Pain Assessment: 0-10 Pain Score: 3  Faces Pain Scale: Hurts a little bit Pain Location: back of head where surgery was Pain Descriptors / Indicators: Sore Pain Intervention(s): Repositioned(used towel roll instead of pillow)     Hand Dominance Right   Extremity/Trunk Assessment Upper Extremity Assessment Upper Extremity Assessment: Overall WFL for tasks assessed     Communication Communication Communication: No difficulties   Cognition Arousal/Alertness: Awake/alert Behavior During Therapy: WFL for tasks assessed/performed Overall Cognitive Status: Within Functional Limits for tasks assessed                                                Home Living Family/patient expects to be discharged to:: Private residence Living Arrangements: Spouse/significant other Available Help at Discharge: Family;Available 24 hours/day Type of Home: House Home Access: Stairs to enter [employer] of Steps: 2   Home Layout: Two level Alternate Level Stairs-Number of  Steps: 13   Bathroom Shower/Tub: Walk-in Hydrologist: Handicapped height     Home Equipment: Radio producer - single point          rior Functioning/Environment Level of Independence: Independent        Comments: Works part time at [employer] Problem List: Impaired vision/perception      OT Treatment/Interventions: Visual/perceptual remediation/compensation;Patient/family education    OT Goals(Current goals can be found in the care plan section) Acute Rehab OT Goals Patient Stated Goal: "for vision to improve." OT Goal Formulation: With patient Time For Goal Achievement: 07/20/19 Potential to Achieve Goals: Good  OT Frequency: Min 2X/week              AM-PAC OT "6 Clicks" Daily  Activity     Outcome Measure Help from another person eating meals?: None Help from another person taking care of personal grooming?: A Little Help from another person toileting, which includes using toliet, bedpan, or urinal?: A Little Help from another person bathing (including washing, rinsing, drying)?: A Little Help from another person to put on and taking off regular upper body clothing?: A Little Help from another person to put on and taking off regular lower body clothing?: A Little 6 Click Score: 19   End of Session Equipment Utilized During Treatment: Gait belt  Activity Tolerance: Patient tolerated treatment well Patient left: in bed;with call bell/phone within reach;with bed alarm set  OT Visit Diagnosis: Low vision, both eyes (H54.2)                Time: 1029-1050 OT Time Calculation (min): 21 min Charges:  OT General Charges $OT Visit: 1 Visit OT Evaluation $OT Eval Moderate Complexity: 1 Mod Cathy OTR/L Acute [employer] Pager [phone] Office [phone]     07/06/2019, 10:55 AM

## 2019-07-06 NOTE — TOC Initial Note (Signed)
Transition of Care [facility]) - Initial/Assessment Note    Patient Details  Name: [name] MRN: [id number] Date of Birth: [birth year]  [facility]) CM/SW Contact:    Geralynn Ochs, LCSW Phone Number: 07/06/2019, 11:44 AM  Clinical Narrative:  Patient from home with wife, recs for neurorehab for OT for vision. Patient agreeable to outpatient OT, has transport to neurorehab center. No other TOC needs identified at this time.     Expected Discharge Plan: OP Rehab Barriers to Discharge: Continued Medical Work up   Patient Goals and CMS Choice Patient states their goals for this hospitalization and ongoing recovery are:: for his vision to improve CMS Medicare.gov Compare Post Acute Care list provided to:: Patient Choice offered to / list presented to : Patient  Expected Discharge Plan and Services Expected Discharge Plan: OP Rehab       Living arrangements for the past 2 months: Single Family Home                                      Prior Living Arrangements/Services Living arrangements for the past 2 months: Single Family Home Lives with:: Spouse Patient language and need for interpreter reviewed:: No Do you feel safe going back to the place where you live?: Yes      Need for Family Participation in Patient Care: No (Comment) Care giver support system in place?: Yes (comment)   Criminal Activity/Legal Involvement Pertinent to Current Situation/Hospitalization: No - Comment as needed  Activities of Daily Living Home Assistive Devices/Equipment: Other (Comment), Cane (specify quad or straight)(walking stick) ADL Screening (condition at time of admission) Patient's cognitive ability adequate to safely complete daily activities?: Yes Is the patient deaf or have difficulty hearing?: No Does the patient have difficulty seeing, even when wearing glasses/contacts?: Yes Does the patient have difficulty concentrating, remembering, or making decisions?:  No Patient able to express need for assistance with ADLs?: Yes Does the patient have difficulty dressing or bathing?: No Independently performs ADLs?: No Communication: Independent Dressing (OT): Needs assistance Is this a change from baseline?: Change from baseline, expected to last <3days Grooming: Needs assistance Is this a change from baseline?: Change from baseline, expected to last <3 days Feeding: Needs assistance Is this a change from baseline?: Change from baseline, expected to last >3 days Bathing: Needs assistance Is this a change from baseline?: Change from baseline, expected to last <3 days Toileting: Needs assistance Is this a change from baseline?: Change from baseline, expected to last <3 days In/Out Bed: Needs assistance Is this a change from baseline?: Change from baseline, expected to last <3 days Walks in Home: Needs assistance Is this a change from baseline?: Change from baseline, expected to last <3 days Does the patient have difficulty walking or climbing stairs?: No Weakness of Legs: None Weakness of Arms/Hands: None  Permission Sought/Granted Permission sought to share information with : Family Supports, Chartered certified accountant granted to share information with : Yes, Verbal Permission Granted     Permission granted to share info w AGENCY: Neurorehab        Emotional Assessment Appearance:: Appears stated age Attitude/Demeanor/Rapport: Engaged Affect (typically observed): Pleasant Orientation: : Oriented to Self, Oriented to Place, Oriented to  Time, Oriented to Situation      Admission diagnosis:  S/P craniotomy [Z98.890] Brain tumor, 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  S/P craniotomy 07/04/2019  . 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 Respiratory failure ([place])   . Encephalopathy acute   . Brain mass 07/01/2019  . Fever 07/01/2019  . Atherosclerosis of native artery of both lower  extremities with intermittent claudication ([place]) 08/23/2018  . Coronary artery disease without angina pectoris 08/23/2018  . Dyspnea on exertion 08/23/2018  . Biochemically recurrent malignant neoplasm of prostate 08/16/2018  . S/P carotid endarterectomy 08/20/2015  . Essential hypertension 08/20/2015  . Type 2 diabetes mellitus with circulatory disorder ([place]) 08/20/2015  . HLD (hyperlipidemia) 08/20/2015  . Carotid artery stenosis, symptomatic 07/03/2015  . Carotid stenosis, right   . TIA (transient ischemic attack) 06/18/2015  . ASCVD (arteriosclerotic cardiovascular disease) 06/18/2015  . Diabetes mellitus without complication ([place]) [id number]  . Resistant hypertension 06/18/2015  . Hyperlipidemia 06/18/2015  . Spinal stenosis of lumbar region with neurogenic claudication 09/06/2014  . Atherosclerosis of native artery of extremity with intermittent claudication ([place]) 03/09/2013  . HYPERLIPIDEMIA 04/08/2009  . TOBACCO ABUSE 04/08/2009  . ATHEROSCLEROTIC CARDIOVASCULAR DISEASE 04/08/2009  . INTERMITTENT VERTIGO 04/08/2009  . ADENOCARCINOMA, PROSTATE 03/21/2009  . ABDOMINAL PAIN -GENERALIZED 03/21/2009  . History of coronary artery bypass graft 06/28/2008   PCP:  Maurice Small, MD Pharmacy:   [facility] [place], [address] AT [address] [place] [address] Phone: [phone] Fax: [phone]  [facility] [place] [place] - [address] [place] [address] Phone: [phone] Fax: [phone]     Social Determinants of Health (SDOH) Interventions    Readmission Risk Interventions No flowsheet data found.

## 2019-07-06 NOTE — Progress Notes (Signed)
New Admission Note:  Arrival Method: Wheelchair Mental Orientation: A&O x4 Telemetry: None Assessment: Completed Skin: Intact IV: Left AC/Right Hand - Saline locked Pain: 5/10 Safety Measures: Safety Fall Prevention Plan was given, discussed. Admission: Completed 3W: Patient has been orientated to the room, unit and the staff. Family: None at bedside  Orders have been reviewed and implemented. Will continue to monitor the patient. Call light has been placed within reach and bed alarm has been activated.   Lacretia Leigh RN

## 2019-07-07 ENCOUNTER — Ambulatory Visit: Payer: Medicare Other | Admitting: Cardiology

## 2019-07-07 LAB — GLUCOSE, CAPILLARY
Glucose-Capillary: 133 mg/dL — ABNORMAL HIGH (ref 70–99)
Glucose-Capillary: 178 mg/dL — ABNORMAL HIGH (ref 70–99)
Glucose-Capillary: 137 mg/dL — ABNORMAL HIGH (ref 70–99)

## 2019-07-07 MED ORDER — HYDROCODONE-ACETAMINOPHEN 5-325 MG PO TABS: 1.0000 | ORAL_TABLET | Freq: Four times a day (QID) | ORAL | 0 refills | Status: AC | PRN

## 2019-07-07 MED ORDER — LEVETIRACETAM 500 MG PO TABS: 500.0000 mg | ORAL_TABLET | Freq: Two times a day (BID) | ORAL | 1 refills | Status: DC

## 2019-07-07 MED ORDER — DEXAMETHASONE 4 MG PO TABS: 4.0000 mg | ORAL_TABLET | Freq: Two times a day (BID) | ORAL | 0 refills | Status: AC

## 2019-07-07 NOTE — Progress Notes (Signed)
Occupational Therapy Treatment Patient Details Name: [name] MRN: [id number] DOB: 25-Apr-1945 Today's Date: 07/07/2019    History of present illness [name] is a 74 y.o. male who was admitted 3/16 for elective right occipital craniotomy for tumor removal.  Had recent admission 3/13 through 3/15 for headaches, fevers, N/V and was found to have brain mass. Significant PMH of prostate CA s/p XRT, DM, CAD, HTN.     OT comments  Pt complete visual scanning task without errors this session. Pt encourage to have staff to continue to help him due to visual deficits with transfers to decrease risk of falling. Pt reports using a cane at baseline on occasion.    Follow Up Recommendations  Outpatient OT    Equipment Recommendations  None recommended by OT    Recommendations for Other Services      Precautions / Restrictions Precautions Precautions: Fall       Mobility Bed Mobility Overal bed mobility: Independent                Transfers Overall transfer level: Modified independent                    Balance Overall balance assessment: No apparent balance deficits (not formally assessed)                                         ADL either performed or assessed with clinical judgement   ADL                                               Vision   Additional Comments: pt provided visual scanning task and able to complete all without cues. pt needs attention head turns to locate but 100 % correct responses. pt does states that he can see half of image at times and guessing what the other side should be to complete the object   Perception     Praxis      Cognition Arousal/Alertness: Awake/alert Behavior During Therapy: WFL for tasks assessed/performed Overall Cognitive Status: Within Functional Limits for tasks assessed                                          Exercises     Shoulder  Instructions       General Comments      Pertinent Vitals/ Pain       Pain Assessment: No/denies pain  Home Living                                          Prior Functioning/Environment              Frequency  Min 2X/week        Progress Toward Goals  OT Goals(current goals can now be found in the care plan section)  Progress towards OT goals: Progressing toward goals  Acute Rehab OT Goals Patient Stated Goal: "for vision to improve." OT Goal Formulation: With patient Time For Goal Achievement: 07/20/19 Potential to Achieve Goals: Good ADL Goals Additional ADL  Goal #1: Pt will be able to independently scan environment to find items asked of him Additional ADL Goal #2: Pt will be able to scan paper tasks independently to complete them  Plan Discharge plan remains appropriate    Co-evaluation                 AM-PAC OT "6 Clicks" Daily Activity     Outcome Measure   Help from another person eating meals?: None Help from another person taking care of personal grooming?: A Little Help from another person toileting, which includes using toliet, bedpan, or urinal?: A Little Help from another person bathing (including washing, rinsing, drying)?: A Little Help from another person to put on and taking off regular upper body clothing?: A Little Help from another person to put on and taking off regular lower body clothing?: A Little 6 Click Score: 19    End of Session Equipment Utilized During Treatment: Gait belt  OT Visit Diagnosis: Low vision, both eyes (H54.2)   Activity Tolerance Patient tolerated treatment well   Patient Left in chair;with call bell/phone within reach   Nurse Communication Mobility status;Precautions        Time: [id number] OT Time Calculation (min): 14 min  Charges: OT General Charges $OT Visit: 1 Visit OT Treatments $Therapeutic Activity: 8-22 mins   Brynn, OTR/L  Acute Rehabilitation Services Pager:  [phone] Office: [phone] .    Jeri Modena 07/07/2019, 3:34 PM

## 2019-07-07 NOTE — Plan of Care (Signed)
Plan of care reviewed with pt and wife at bedside. Pt stable throughout shift. Discharge education complete by discharge RN. Will continue to monitor pt until dc from floor.   Problem: Education: Goal: Knowledge of General Education information will improve Description: Including pain rating scale, medication(s)/side effects and non-pharmacologic comfort measures 07/07/2019 1600 by Marty Heck, RN Outcome: Completed/Met 07/07/2019 1339 by Marty Heck, RN Outcome: Progressing   Problem: Health Behavior/Discharge Planning: Goal: Ability to manage health-related needs will improve 07/07/2019 1600 by Marty Heck, RN Outcome: Completed/Met 07/07/2019 1339 by Marty Heck, RN Outcome: Progressing   Problem: Clinical Measurements: Goal: Ability to maintain clinical measurements within normal limits will improve 07/07/2019 1600 by Marty Heck, RN Outcome: Completed/Met 07/07/2019 1339 by Marty Heck, RN Outcome: Progressing Goal: Will remain free from infection 07/07/2019 1600 by Marty Heck, RN Outcome: Completed/Met 07/07/2019 1339 by Marty Heck, RN Outcome: Progressing Goal: Diagnostic test results will improve 07/07/2019 1600 by Marty Heck, RN Outcome: Completed/Met 07/07/2019 1339 by Marty Heck, RN Outcome: Progressing Goal: Respiratory complications will improve 07/07/2019 1600 by Marty Heck, RN Outcome: Completed/Met 07/07/2019 1339 by Marty Heck, RN Outcome: Progressing Goal: Cardiovascular complication will be avoided 07/07/2019 1600 by Marty Heck, RN Outcome: Completed/Met 07/07/2019 1339 by Marty Heck, RN Outcome: Progressing   Problem: Activity: Goal: Risk for activity intolerance will decrease 07/07/2019 1600 by Marty Heck, RN Outcome: Completed/Met 07/07/2019 1339 by Marty Heck, RN Outcome: Progressing   Problem: Nutrition: Goal: Adequate nutrition will be maintained 07/07/2019 1600 by Marty Heck,  RN Outcome: Completed/Met 07/07/2019 1339 by Marty Heck, RN Outcome: Progressing   Problem: Coping: Goal: Level of anxiety will decrease 07/07/2019 1600 by Marty Heck, RN Outcome: Completed/Met 07/07/2019 1339 by Marty Heck, RN Outcome: Progressing   Problem: Elimination: Goal: Will not experience complications related to bowel motility 07/07/2019 1600 by Marty Heck, RN Outcome: Completed/Met 07/07/2019 1339 by Marty Heck, RN Outcome: Progressing Goal: Will not experience complications related to urinary retention 07/07/2019 1600 by Marty Heck, RN Outcome: Completed/Met 07/07/2019 1339 by Marty Heck, RN Outcome: Progressing   Problem: Pain Managment: Goal: General experience of comfort will improve 07/07/2019 1600 by Marty Heck, RN Outcome: Completed/Met 07/07/2019 1339 by Marty Heck, RN Outcome: Progressing   Problem: Safety: Goal: Ability to remain free from injury will improve 07/07/2019 1600 by Marty Heck, RN Outcome: Completed/Met 07/07/2019 1339 by Marty Heck, RN Outcome: Progressing   Problem: Skin Integrity: Goal: Risk for impaired skin integrity will decrease 07/07/2019 1600 by Marty Heck, RN Outcome: Completed/Met 07/07/2019 1339 by Marty Heck, RN Outcome: Progressing   Problem: Education: Goal: Knowledge of the prescribed therapeutic regimen will improve 07/07/2019 1600 by Marty Heck, RN Outcome: Completed/Met 07/07/2019 1339 by Marty Heck, RN Outcome: Progressing   Problem: Clinical Measurements: Goal: Usual level of consciousness will be regained or maintained. 07/07/2019 1600 by Marty Heck, RN Outcome: Completed/Met 07/07/2019 1339 by Marty Heck, RN Outcome: Progressing Goal: Neurologic status will improve 07/07/2019 1600 by Marty Heck, RN Outcome: Completed/Met 07/07/2019 1339 by Marty Heck, RN Outcome: Progressing Goal: Ability to maintain intracranial pressure  will improve 07/07/2019 1600 by Marty Heck, RN Outcome: Completed/Met 07/07/2019 1339 by Marty Heck, RN Outcome: Progressing   Problem: Skin Integrity: Goal: Demonstration of wound healing without infection will improve 07/07/2019 1600 by Saintclair Halsted  J, RN Outcome: Completed/Met 07/07/2019 1339 by Marty Heck, RN Outcome: Progressing

## 2019-07-07 NOTE — Discharge Instructions (Signed)
Craniotomy °Care After °Please read the instructions outlined below and refer to this sheet in the next few weeks. These discharge instructions provide you with general information on caring for yourself after you leave the hospital. Your surgeon may also give you specific instructions. While your treatment has been planned according to the most current medical practices available, unavoidable complications occasionally occur. If you have any problems or questions after discharge, please call your surgeon. °Although there are many types of brain surgery, recovery following craniotomy (surgical opening of the skull) is much the same for each. However, recovery depends on many factors. These include the type and severity of brain injury and the type of surgery. It also depends on any nervous system function problems (neurological deficits) before surgery. If the craniotomy was done for cancer, chemotherapy and radiation could follow. You could be in the hospital from 5 days to a couple weeks. This depends on the type of surgery, findings, and whether there are complications. °HOME CARE INSTRUCTIONS  °· It is not unusual to hear a clicking noise after a craniotomy, the plates and screws used to attach the bone flap can sometimes cause this. It is a normal occurrence if this does happen °· Do not drive for 10 days after the operation °· Your scalp may feel spongy for a while, because of fluid under it. This will gradually get better. Occasionally, the surgeon will not replace the bone that was removed to access the brain. If there is a bony defect, the surgeon will ask you to wear a helmet for protection. This is a discussion you should have with your surgeon prior to leaving the hospital (discharge). °· Numbness may persist in some areas of your scalp. °· Take all medications as directed. Sometimes steroids to control swelling are prescribed. Anticonvulsants to prevent seizures may also be given. Do not use alcohol,  other drugs, or medications unless your surgeon says it is OK. °· Keep the wound dry and clean. The wound may be washed gently with soap and water. Then, you may gently blot or dab it dry, without rubbing. Do not take baths, use swimming pools or hot tubs for 10 days, or as instructed by your caregiver. It is best to wait to see you surgeon at your first postoperative visit, and to get directions at that time. °· Only take over-the-counter or prescription medicines for pain, discomfort, or fever as directed by your caregiver. °· You may continue your normal diet, as directed. °· Walking is OK for exercise. Wait at least 3 months before you return to mild, non-contact sports or as your surgeon suggests. Contact sports should be avoided for at least 1 year, unless your surgeon says it is OK. °· If you are prescribed steroids, take them exactly as prescribed. If you start having a decrease in nervous system functions (neurological deficits) and headaches as the dose of steroids is reduced, tell your surgeon right away. °· When the anticonvulsant prescription is finished you no longer need to take it. °SEEK IMMEDIATE MEDICAL CARE IF:  °· You develop nausea, vomiting, severe headaches, confusion, or you have a seizure. °· You develop chest pain, a stiff neck, or difficulty breathing. °· There is redness, swelling, or increasing pain in the wound or pin insertion sites. °· You have an increase in swelling or bruising around the eyes. °· There is drainage or pus coming from the wound. °· You have an oral temperature above 102° F (38.9° C), not controlled by medicine. °·   You notice a foul smell coming from the wound or dressing. °· The wound breaks open (edges not staying together) after the stitches have been removed. °· You develop dizziness or fainting while standing. °· You develop a rash. °· You develop any reaction or side effects to the medications given. °Document Released: 07/07/2005 Document Revised: 06/29/2011  Document Reviewed: 04/15/2009 °[employer]® Patient Information ©2013 [employer]. ° °

## 2019-07-07 NOTE — Progress Notes (Signed)
Physical Therapy Treatment Patient Details Name: [name] MRN: [id number] DOB: 1946/01/27 Today's Date: 07/07/2019    History of Present Illness [name] is a 74 y.o. male who was admitted 3/16 for elective right occipital craniotomy for tumor removal.  Had recent admission 3/13 through 3/15 for headaches, fevers, N/V and was found to have brain mass. Significant PMH of prostate CA s/p XRT, DM, CAD, HTN.      PT Comments    Session focused on balance training. Pt presents with decreased gait speed but able to perform high level balance activities I.e. side stepping, backwards walking without physical assist. Pt reporting continued "difficulty concentrating," and headache. Compensating for visual deficits fairly well. No PT follow up recommended.   Follow Up Recommendations  No PT follow up;Supervision/Assistance - 24 hour     Equipment Recommendations  None recommended by PT    Recommendations for Other Services       Precautions / Restrictions Precautions Precautions: Fall Restrictions Weight Bearing Restrictions: No    Mobility  Bed Mobility Overal bed mobility: Independent                Transfers Overall transfer level: Independent Equipment used: None                Ambulation/Gait Ambulation/Gait assistance: Supervision Gait Distance (Feet): 550 Feet Assistive device: None Gait Pattern/deviations: Step-through pattern;Decreased stride length Gait velocity: decreased   General Gait Details: Slow pace, able to perform high level balance activities without physical assist   Stairs             Wheelchair Mobility    Modified Rankin (Stroke Patients Only)       Balance Overall balance assessment: Mild deficits observed, not formally tested                                          Cognition Arousal/Alertness: Awake/alert Behavior During Therapy: WFL for tasks assessed/performed Overall Cognitive  Status: Within Functional Limits for tasks assessed                                        Exercises Other Exercises Other Exercises: Dynamic balance activities: head turns, stops/starts, varying gait speeds, stepping over obstacles, side stepping, backwards walking    General Comments        Pertinent Vitals/Pain Pain Assessment: Faces Faces Pain Scale: Hurts little more Pain Location: headache Pain Descriptors / Indicators: Headache Pain Intervention(s): Patient requesting pain meds-RN notified    Home Living                      Prior Function            PT Goals (current goals can now be found in the care plan section) Acute Rehab PT Goals Patient Stated Goal: "for vision to improve." Potential to Achieve Goals: Good Progress towards PT goals: Progressing toward goals    Frequency    Min 4X/week      PT Plan Current plan remains appropriate    Co-evaluation              AM-PAC PT "6 Clicks" Mobility   Outcome Measure  Help needed turning from your back to your side while in a flat bed without using bedrails?:  None Help needed moving from lying on your back to sitting on the side of a flat bed without using bedrails?: None Help needed moving to and from a bed to a chair (including a wheelchair)?: None Help needed standing up from a chair using your arms (e.g., wheelchair or bedside chair)?: None Help needed to walk in hospital room?: None Help needed climbing 3-5 steps with a railing? : None 6 Click Score: 24    End of Session Equipment Utilized During Treatment: Gait belt Activity Tolerance: Patient tolerated treatment well Patient left: in bed;with call bell/phone within reach;with family/visitor present Nurse Communication: Mobility status PT Visit Diagnosis: Unsteadiness on feet (R26.81);Difficulty in walking, not elsewhere classified (R26.2)     Time: [id number] PT Time Calculation (min) (ACUTE ONLY): 14  min  Charges:  $Therapeutic Activity: 8-22 mins                       Wyona Almas, PT, DPT Acute Rehabilitation Services Pager [phone] Office [phone]    Deno Etienne 07/07/2019, 5:11 PM

## 2019-07-07 NOTE — Discharge Summary (Signed)
Physician Discharge Summary  Patient ID: [name] MRN: [id number] DOB/AGE: [birth year] 74 y.o.  Admit date: 07/04/2019 Discharge date: 07/07/2019  Admission Diagnoses:brain tumor  Discharge Diagnoses: glioblastoma multiforme, right occipital lobe Active Problems:   Coronary artery disease without angina pectoris   S/P craniotomy   Brain tumor, glioma (HCC)   Respiratory failure (HCC)   Encephalopathy acute   Discharged Condition: good  Hospital Course: Mr. Wehrer was admitted and taken to the operating room for a tumor resection. Post op he maintains the left homonymous hemianopsia. His wound is clean, dry, and without signs of infection He is ambulating, voiding, and tolerating a regular diet at discharge He will be seen again by [name] our neurooncologist as an outpatient Treatments: surgery: occipital craniotomy for tumor resection.   Discharge Exam: Blood pressure (!) 147/71, pulse 68, temperature 98.3 F (36.8 C), temperature source Oral, resp. rate 16, height 6\' 2"  (1.88 m), weight 107.5 kg, SpO2 97 %. Neurologic: Alert and oriented X 3, normal strength and tone. Normal symmetric reflexes. Normal coordination and gait  Left homonymous hemianopsia. Speech is clear and fluent.  Disposition: Discharge disposition: 01-Home or Self Care      Brain tumor Discharge Instructions    Ambulatory referral to Occupational Therapy   Complete by: As directed      Allergies as of 07/07/2019      Reactions   Lisinopril Cough      Medication List    TAKE these medications   amLODipine 10 MG tablet Commonly known as: NORVASC TAKE 1 TABLET BY MOUTH DAILY   atorvastatin 80 MG tablet Commonly known as: LIPITOR TAKE 1 TABLET BY MOUTH DAILY AT 6 PM   baclofen 20 MG tablet Commonly known as: LIORESAL Take 20 mg by mouth 2 (two) times daily.   cilostazol 50 MG tablet Commonly known as: PLETAL Take 1 tablet (50 mg total) by mouth 2 (two) times daily.    dexamethasone 4 MG tablet Commonly known as: DECADRON Take 1 tablet (4 mg total) by mouth 2 (two) times daily with a meal for 4 days.   DULCOLAX PO Take 1 tablet by mouth daily as needed (constipation).   HYDROcodone-acetaminophen 5-325 MG tablet Commonly known as: NORCO/VICODIN Take 1 tablet by mouth every 6 (six) hours as needed for up to 8 days for moderate pain.   levETIRAcetam 500 MG tablet Commonly known as: KEPPRA Take 1 tablet (500 mg total) by mouth 2 (two) times daily. What changed: Another medication with the same name was added. Make sure you understand how and when to take each.   levETIRAcetam 500 MG tablet Commonly known as: Keppra Take 1 tablet (500 mg total) by mouth every 12 (twelve) hours. What changed: You were already taking a medication with the same name, and this prescription was added. Make sure you understand how and when to take each.   losartan-hydrochlorothiazide 100-12.5 MG tablet Commonly known as: HYZAAR TAKE 1 TABLET BY MOUTH EVERY DAY   metoprolol succinate 25 MG 24 hr tablet Commonly known as: TOPROL-XL TAKE 1 TABLET BY MOUTH DAILY What changed:   how much to take  how to take this  when to take this  additional instructions   Nexlizet 180-10 MG Tabs Generic drug: Bempedoic Acid-Ezetimibe Take 1 tablet by mouth daily.      Follow-up Information    [facility] Follow up.   Specialty: Rehabilitation Why: The outpatient rehab will contact you for the first appointment Contact information: [phone]  [place] [id number] [phone]       Ashok Pall, MD Follow up in 2 week(s).   Specialty: Neurosurgery Why: please call to make an appointment for staple removal Contact information: [address] [place] [address] [place] [address] [phone]           Signed: Ashok Pall 07/07/2019, 3:49 PM

## 2019-07-07 NOTE — Plan of Care (Signed)
Plan of care was reviewed with pt at bedside. Honeycomb dressing intact with dried drainage marked. Pt stable at this time, will continue to monitor.  Problem: Education: Goal: Knowledge of General Education information will improve Description: Including pain rating scale, medication(s)/side effects and non-pharmacologic comfort measures Outcome: Progressing   Problem: Health Behavior/Discharge Planning: Goal: Ability to manage health-related needs will improve Outcome: Progressing   Problem: Clinical Measurements: Goal: Ability to maintain clinical measurements within normal limits will improve Outcome: Progressing Goal: Will remain free from infection Outcome: Progressing Goal: Diagnostic test results will improve Outcome: Progressing Goal: Respiratory complications will improve Outcome: Progressing Goal: Cardiovascular complication will be avoided Outcome: Progressing   Problem: Activity: Goal: Risk for activity intolerance will decrease Outcome: Progressing   Problem: Nutrition: Goal: Adequate nutrition will be maintained Outcome: Progressing   Problem: Coping: Goal: Level of anxiety will decrease Outcome: Progressing   Problem: Elimination: Goal: Will not experience complications related to bowel motility Outcome: Progressing Goal: Will not experience complications related to urinary retention Outcome: Progressing   Problem: Pain Managment: Goal: General experience of comfort will improve Outcome: Progressing   Problem: Safety: Goal: Ability to remain free from injury will improve Outcome: Progressing   Problem: Skin Integrity: Goal: Risk for impaired skin integrity will decrease Outcome: Progressing   Problem: Education: Goal: Knowledge of the prescribed therapeutic regimen will improve Outcome: Progressing   Problem: Clinical Measurements: Goal: Usual level of consciousness will be regained or maintained. Outcome: Progressing Goal: Neurologic status  will improve Outcome: Progressing Goal: Ability to maintain intracranial pressure will improve Outcome: Progressing   Problem: Skin Integrity: Goal: Demonstration of wound healing without infection will improve Outcome: Progressing

## 2019-07-07 NOTE — TOC Transition Note (Signed)
Transition of Care [facility]) - CM/SW Discharge Note   Patient Details  Name: [name] MRN: [id number] Date of Birth: 1945/06/28  [facility] of [facility] CM/SW Contact:  Pollie Friar, RN Phone Number: 07/07/2019, 4:25 PM   Clinical Narrative:    Pt discharging home with outpatient therapy. Information on the AVS. Pt has supervision at home and transportation to home.   Final next level of care: OP Rehab Barriers to Discharge: No Barriers Identified   Patient Goals and CMS Choice Patient states their goals for this hospitalization and ongoing recovery are:: for his vision to improve CMS Medicare.gov Compare Post Acute Care list provided to:: Patient Choice offered to / list presented to : Patient  Discharge Placement                       Discharge Plan and Services                                     Social Determinants of Health (SDOH) Interventions     Readmission Risk Interventions No flowsheet data found.

## 2019-07-10 ENCOUNTER — Other Ambulatory Visit: Payer: Self-pay | Admitting: Radiation Therapy

## 2019-07-10 ENCOUNTER — Inpatient Hospital Stay: Payer: Medicare Other

## 2019-07-10 DIAGNOSIS — C719 Malignant neoplasm of brain, unspecified: Secondary | ICD-10-CM

## 2019-07-12 ENCOUNTER — Telehealth: Payer: Self-pay | Admitting: *Deleted

## 2019-07-12 ENCOUNTER — Other Ambulatory Visit: Payer: Self-pay | Admitting: Radiation Oncology

## 2019-07-12 MED ORDER — LORAZEPAM 0.5 MG PO TABS: ORAL_TABLET | ORAL | 0 refills | Status: DC

## 2019-07-13 ENCOUNTER — Inpatient Hospital Stay: Payer: Medicare Other | Attending: Internal Medicine | Admitting: Internal Medicine

## 2019-07-13 ENCOUNTER — Other Ambulatory Visit: Payer: Self-pay

## 2019-07-13 ENCOUNTER — Telehealth: Payer: Self-pay | Admitting: *Deleted

## 2019-07-13 VITALS — BP 143/75 | HR 58 | Temp 98.0°F | Resp 17 | Ht 74.0 in | Wt 225.0 lb

## 2019-07-13 DIAGNOSIS — I252 Old myocardial infarction: Secondary | ICD-10-CM

## 2019-07-13 DIAGNOSIS — E1140 Type 2 diabetes mellitus with diabetic neuropathy, unspecified: Secondary | ICD-10-CM | POA: Insufficient documentation

## 2019-07-13 DIAGNOSIS — E785 Hyperlipidemia, unspecified: Secondary | ICD-10-CM | POA: Insufficient documentation

## 2019-07-13 DIAGNOSIS — G4730 Sleep apnea, unspecified: Secondary | ICD-10-CM | POA: Diagnosis not present

## 2019-07-13 DIAGNOSIS — C719 Malignant neoplasm of brain, unspecified: Secondary | ICD-10-CM

## 2019-07-13 DIAGNOSIS — Z8546 Personal history of malignant neoplasm of prostate: Secondary | ICD-10-CM | POA: Diagnosis not present

## 2019-07-13 DIAGNOSIS — Z803 Family history of malignant neoplasm of breast: Secondary | ICD-10-CM | POA: Insufficient documentation

## 2019-07-13 DIAGNOSIS — I2510 Atherosclerotic heart disease of native coronary artery without angina pectoris: Secondary | ICD-10-CM

## 2019-07-13 DIAGNOSIS — Z7902 Long term (current) use of antithrombotics/antiplatelets: Secondary | ICD-10-CM | POA: Insufficient documentation

## 2019-07-13 DIAGNOSIS — C714 Malignant neoplasm of occipital lobe: Secondary | ICD-10-CM | POA: Diagnosis not present

## 2019-07-13 DIAGNOSIS — Z79899 Other long term (current) drug therapy: Secondary | ICD-10-CM | POA: Diagnosis not present

## 2019-07-13 DIAGNOSIS — Z8673 Personal history of transient ischemic attack (TIA), and cerebral infarction without residual deficits: Secondary | ICD-10-CM | POA: Insufficient documentation

## 2019-07-13 DIAGNOSIS — I10 Essential (primary) hypertension: Secondary | ICD-10-CM | POA: Diagnosis not present

## 2019-07-13 DIAGNOSIS — Z87891 Personal history of nicotine dependence: Secondary | ICD-10-CM | POA: Diagnosis not present

## 2019-07-13 DIAGNOSIS — Z7189 Other specified counseling: Secondary | ICD-10-CM

## 2019-07-13 NOTE — Telephone Encounter (Signed)
Attempted to call the patient to inform him that we sent a prescription to his pharmacy for medication to help with his upcoming scan.  Unable to leave a voicemail as his mailbox was full.  Will reattempt tomorrow.  Gloriajean Dell. Leonie Green, BSN

## 2019-07-13 NOTE — Progress Notes (Signed)
[facility] [place], [place] [address] [phone]   New Patient Evaluation  Date of Service: 07/13/19 Patient Name: [name] Patient MRN: [id number] Patient DOB: Nov 17, 1945 Provider: Ventura Sellers, MD  Identifying Statement:  [name] is a 74 y.o. male with right occipital glioblastoma who presents for initial consultation and evaluation.    Referring Provider: Maurice Small, MD [address],  [place] [address]  Oncologic History: Oncology History  Glioblastoma with isocitrate dehydrogenase gene wildtype [facility])  07/04/2019 Surgery   Debulking resection by Dr. Christella Noa; path demonstrates Glioblastoma     Biomarkers:  MGMT Unknown.  IDH 1/2 Unknown.  EGFR Unknown  TERT Unknown   History of Present Illness: The patient's records from the referring physician were obtained and reviewed and the patient interviewed to confirm this HPI.  [name] presented in early March 2021 with several days of progressive left sided visual impairment.  He noticed "missing things" in the left side of his field in his work and while driving.  He then developed headaches which were new for him.  Never complained of difficulty walking, speaking, using arms or hands.  CNS imaging demonstrated tumor within right occipital lobe, which was biopsied by Dr. Christella Noa on 07/04/19.  Following surgery, he has no new complaints or progressive deficits.  He may be taking decadron 2m twice per day.  Otherwise his functional status is only limited by visual impairment. Worked at a[employer]and lives with his wife of 589years.  Medications: Current Outpatient Medications on File Prior to Visit  Medication Sig Dispense Refill  . amLODipine (NORVASC) 10 MG tablet TAKE 1 TABLET BY MOUTH DAILY 90 tablet 0  . atorvastatin (LIPITOR) 80 MG tablet TAKE 1 TABLET BY MOUTH DAILY AT 6 PM (Patient taking differently:  TAKE 1 TABLET BY MOUTH DAILY AT 6 PM) 90 tablet 1  . baclofen (LIORESAL) 20 MG tablet Take 20 mg by mouth 2 (two) times daily.     . Bempedoic Acid-Ezetimibe (NEXLIZET) 180-10 MG TABS Take 1 tablet by mouth daily. 30 tablet 1  . cilostazol (PLETAL) 50 MG tablet Take 1 tablet (50 mg total) by mouth 2 (two) times daily. 60 tablet 1  . HYDROcodone-acetaminophen (NORCO/VICODIN) 5-325 MG tablet Take 1 tablet by mouth every 6 (six) hours as needed for up to 8 days for moderate pain. 30 tablet 0  . levETIRAcetam (KEPPRA) 500 MG tablet Take 1 tablet (500 mg total) by mouth 2 (two) times daily. 60 tablet 0  . levETIRAcetam (KEPPRA) 500 MG tablet Take 1 tablet (500 mg total) by mouth every 12 (twelve) hours. 60 tablet 1  . LORazepam (ATIVAN) 0.5 MG tablet 1 tab po q 4-6 hours prn or 1 tab po 30 minutes prior to MRI or radiation 30 tablet 0  . losartan-hydrochlorothiazide (HYZAAR) 100-12.5 MG tablet TAKE 1 TABLET BY MOUTH EVERY DAY 90 tablet 0  . Magnesium Hydroxide (DULCOLAX PO) Take 1 tablet by mouth daily as needed (constipation).    . metoprolol succinate (TOPROL-XL) 25 MG 24 hr tablet TAKE 1 TABLET BY MOUTH DAILY (Patient taking differently: Take 25 mg by mouth daily. ) 90 tablet 1   No current facility-administered medications on file prior to visit.    Allergies:  Allergies  Allergen Reactions  . Lisinopril Cough   Past Medical History:  Past Medical History:  Diagnosis Date  . Anxiety   .  Arthritis   . ASCVD (arteriosclerotic cardiovascular disease)    03/2004: DES x2 to the RCA; IMI with stent occlusion in 02/2005- suboptimal Plavix compliance  . CAD (coronary artery disease)   . Carcinoma of prostate ([facility])    Clopidogrel held for biopsy  . Constipation due to pain medication   . Diabetes mellitus without complication ([facility])   . Diabetic neuropathy ([facility])   . Headache 2021  . Hyperlipidemia   . Hypertension   . Mild depression ([facility])   . Morbid obesity ([facility])   . Myocardial infarction  ([facility])   . Shortness of breath dyspnea   . Sleep apnea    no longer uses cpap  . Stroke ([facility]) 2017  . Tobacco abuse    stopped smoking 06/28/09   Past Surgical History:  Past Surgical History:  Procedure Laterality Date  . APPLICATION OF CRANIAL NAVIGATION N/A 07/04/2019   Procedure: APPLICATION OF CRANIAL NAVIGATION;  Surgeon: Ashok Pall, MD;  Location: [facility];  Service: Neurosurgery;  Laterality: N/A;  . BACK SURGERY    . CARDIAC CATHETERIZATION     X 1 stent before having CABG  . CORONARY ARTERY BYPASS GRAFT  06/28/2008   X4  . CRANIOTOMY Right 07/04/2019   Procedure: Right occipital craniotomy for tumor with brainlab;  Surgeon: Ashok Pall, MD;  Location: [facility];  Service: Neurosurgery;  Laterality: Right;  . ENDARTERECTOMY Right 07/03/2015   Procedure: RIGHT CAROTID ENDARTERECTOMY WITH BOVINE PERICARDIUM PATCH ANGIOPLASTY;  Surgeon: Serafina Mitchell, MD;  Location: [facility];  Service: Vascular;  Laterality: Right;  . LUMBAR LAMINECTOMY/DECOMPRESSION MICRODISCECTOMY N/A 09/06/2014   Procedure: LUMBER DECOMPRESSION L3-5;  Surgeon: Melina Schools, MD;  Location: [facility];  Service: Orthopedics;  Laterality: N/A;  . LUMBAR SPINE SURGERY  2002  . PROSTATE BIOPSY    . PROSTATECTOMY  02/2007  . VASECTOMY     Social History:  Social History   Socioeconomic History  . Marital status: Married    Spouse name: Not on file  . Number of children: 4  . Years of education: Not on file  . Highest education level: Not on file  Occupational History  . Occupation: Technical brewer    Comment: retired  . Occupation: Designer, industrial/product: [employer]    Comment: full time  Tobacco Use  . Smoking status: Former Smoker    Packs/day: 1.00    Years: 40.00    Pack years: 40.00    Types: Cigarettes    Quit date: 06/28/2008    Years since quitting: 11.0  . Smokeless tobacco: Former Systems developer    Quit date: 06/28/2009  Substance and Sexual Activity  . Alcohol use: Yes    Alcohol/week: 3.0  standard drinks    Types: 2 Glasses of wine, 1 Cans of beer per week    Comment: may have 1 drink a week  . Drug use: No  . Sexual activity: Not on file  Other Topics Concern  . Not on file  Social History Narrative   Married with 3 sons and 1 daughter   Daily caffeine use, 3 per day   Social Determinants of Health   Financial Resource Strain:   . Difficulty of Paying Living Expenses:   Food Insecurity:   . Worried About Charity fundraiser in the Last Year:   . Arboriculturist in the Last Year:   Transportation Needs:   . Film/video editor (Medical):   Marland Kitchen Lack of Transportation (Non-Medical):  Physical Activity:   . Days of Exercise per Week:   . Minutes of Exercise per Session:   Stress:   . Feeling of Stress :   Social Connections:   . Frequency of Communication with Friends and Family:   . Frequency of Social Gatherings with Friends and Family:   . Attends Religious Services:   . Active Member of Clubs or Organizations:   . Attends Archivist Meetings:   Marland Kitchen Marital Status:   Intimate Partner Violence:   . Fear of Current or Ex-Partner:   . Emotionally Abused:   Marland Kitchen Physically Abused:   . Sexually Abused:    Family History:  Family History  Problem Relation Age of Onset  . Kidney disease Mother   . Stroke Father   . Breast cancer Sister        breast progressed into bone  . Colon cancer Neg Hx   . Prostate cancer Neg Hx   . Pancreatic cancer Neg Hx     Review of Systems: Constitutional: Doesn't report fevers, chills or abnormal weight loss Eyes: Doesn't report blurriness of vision Ears, nose, mouth, throat, and face: Doesn't report sore throat Respiratory: Doesn't report cough, dyspnea or wheezes Cardiovascular: Doesn't report palpitation, chest discomfort  Gastrointestinal:  Doesn't report nausea, constipation, diarrhea GU: Doesn't report incontinence Skin: Doesn't report skin rashes Neurological: Per HPI Musculoskeletal: Doesn't report  joint pain Behavioral/Psych: Doesn't report anxiety  Physical Exam: Vitals:   07/13/19 1157  BP: (!) 143/75  Pulse: (!) 58  Resp: 17  Temp: 98 F (36.7 C)  SpO2: 99%   KPS: 80. General: Alert, cooperative, pleasant, in no acute distress Head: Normal EENT: No conjunctival injection or scleral icterus.  Lungs: Resp effort normal Cardiac: Regular rate Abdomen: Non-distended abdomen Skin: No rashes cyanosis or petechiae. Extremities: No clubbing or edema  Neurologic Exam: Mental Status: Awake, alert, attentive to examiner. Oriented to self and environment. Language is fluent with intact comprehension.  Cranial Nerves: Visual acuity is grossly normal. Left hemianopia. Extra-ocular movements intact. No ptosis. Face is symmetric Motor: Tone and bulk are normal. Power is full in both arms and legs. Reflexes are symmetric, no pathologic reflexes present.  Sensory: Intact to light touch Gait: Normal.   Labs: I have reviewed the data as listed    Component Value Date/Time   NA 133 (L) 07/05/2019 0340   NA 139 05/04/2019 0954   K 5.0 07/05/2019 0340   CL 97 (L) 07/05/2019 0340   CO2 24 07/05/2019 0340   GLUCOSE 162 (H) 07/05/2019 0340   BUN 26 (H) 07/05/2019 0340   BUN 11 05/04/2019 0954   CREATININE 0.98 07/05/2019 0340   CALCIUM 8.3 (L) 07/05/2019 0340   PROT 7.1 07/01/2019 1801   PROT 6.8 05/04/2019 0954   ALBUMIN 4.1 07/01/2019 1801   ALBUMIN 4.5 05/04/2019 0954   AST 16 07/01/2019 1801   ALT 13 07/01/2019 1801   ALKPHOS 61 07/01/2019 1801   BILITOT 1.1 07/01/2019 1801   BILITOT 0.7 05/04/2019 0954   GFRNONAA >60 07/05/2019 0340   GFRAA >60 07/05/2019 0340   Lab Results  Component Value Date   WBC 8.4 07/05/2019   NEUTROABS 6.9 07/01/2019   HGB 10.6 (L) 07/05/2019   HCT 31.3 (L) 07/05/2019   MCV 89.4 07/05/2019   PLT 192 07/05/2019    Imaging:  CT HEAD WO CONTRAST  Result Date: 07/05/2019 CLINICAL DATA:  Stroke follow-up EXAM: CT HEAD WITHOUT CONTRAST  TECHNIQUE: Contiguous axial images were  obtained from the base of the skull through the vertex without intravenous contrast. COMPARISON:  Four days ago FINDINGS: Brain: Interval right occipital surgery with expected minimal blood products and gas. The degree of presumed edema is unchanged when compared to MRI. No hydrocephalus or mass effect. Vascular: No hyperdense vessel or unexpected calcification. Skull: Unremarkable right occipital craniotomy. Sinuses/Orbits: Negative IMPRESSION: No unexpected finding after right occipital surgery. Electronically Signed   By: Monte Fantasia M.D.   On: 07/05/2019 06:33   CT Head Wo Contrast  Result Date: 07/01/2019 CLINICAL DATA:  Dizziness, weakness, visual disturbances EXAM: CT HEAD WITHOUT CONTRAST TECHNIQUE: Contiguous axial images were obtained from the base of the skull through the vertex without intravenous contrast. COMPARISON:  06/18/2015 FINDINGS: Brain: There is a hypodense lesion of the right occipital lobe with minimal associated subcortical hemorrhage (series 3, image 18). Periventricular white matter hypodensity. Vascular: No hyperdense vessel or unexpected calcification. Skull: Normal. Negative for fracture or focal lesion. Sinuses/Orbits: No acute finding. Other: None. IMPRESSION: Hypodense lesion of the right occipital lobe with minimal associated subcortical hemorrhage, most consistent with acute to subacute infarction and minimal hemorrhagic transformation. No significant mass effect. Mass or metastatic lesion is a differential consideration. Consider contrast enhanced MRI to further evaluate. Electronically Signed   By: Eddie Candle M.D.   On: 07/01/2019 18:24   [facility] CT Head W Contrast  Result Date: 07/02/2019 CLINICAL DATA:  Right occipital lobe mass. Surgical planning. EXAM: CT HEAD WITH CONTRAST TECHNIQUE: Contiguous axial images were obtained from the base of the skull through the vertex with intravenous contrast. CONTRAST:  140m OMNIPAQUE  IOHEXOL 300 MG/ML  SOLN COMPARISON:  MR head without and with contrast and CT head without contrast 07/01/2019 FINDINGS: Brain: Peripherally enhancing right occipital mass is again noted. The lesion measures 2.6 x 1.8 x 3.3 cm. Enhancing material extends to the ventricle. No new lesions are present. Surrounding edema is again seen. The ventricles are of normal size. No significant extraaxial fluid collection is present. Vascular: Atherosclerotic changes are present within the cavernous internal carotid arteries and at the dural margin of the right vertebral artery. Skull: Calvarium is intact. No focal lytic or blastic lesions are present. No significant extracranial soft tissue lesion is present. Sinuses/Orbits: The paranasal sinuses and mastoid air cells are clear. The globes and orbits are within normal limits. IMPRESSION: 1. Stable appearance of peripherally enhancing right occipital mass measuring 2.6 x 1.8 x 3.3 cm. This remains concerning for a primary brain neoplasm. 2. Enhancing material extends to the ventricle. Electronically Signed   By: CSan MorelleM.D.   On: 07/02/2019 15:57   MR ANGIO HEAD WO CONTRAST  Result Date: 07/01/2019 CLINICAL DATA:  Headache EXAM: MRI HEAD WITHOUT AND WITH CONTRAST MRA HEAD WITHOUT CONTRAST MRA NECK WITHOUT AND WITH CONTRAST TECHNIQUE: Multiplanar, multiecho pulse sequences of the brain and surrounding structures were obtained without and with intravenous contrast. Angiographic images of the Circle of Willis were obtained using MRA technique without intravenous contrast. Angiographic images of the neck were obtained using MRA technique without and with intravenous contrast. Carotid stenosis measurements (when applicable) are obtained utilizing NASCET criteria, using the distal internal carotid diameter as the denominator. CONTRAST:  180mGADAVIST GADOBUTROL 1 MMOL/ML IV SOLN COMPARISON:  Head CT 07/01/2019 FINDINGS: MRI HEAD FINDINGS Brain: Diffusion abnormality  in the right occipital lobe at the site of lesion identified on earlier CT. There are associated magnetic susceptibility effects due to known hemorrhage. There is moderate edema in the  right occipital lobe that surround contrast-enhancing lesion, which measures 4.8 x 1.9 cm and extends to the border of the right lateral ventricle occipital horn. There is hemosiderin deposition throughout the lesion. There are no other contrast-enhancing lesions. There is no midline shift. No hydrocephalus. Vascular: Normal flow voids Skull and upper cervical spine: Normal marrow signal Sinuses/Orbits: Negative. Other: None MRA HEAD FINDINGS POSTERIOR CIRCULATION: --Vertebral arteries: Normal V4 segments. --Posterior inferior cerebellar arteries (PICA): Patent origins from the vertebral arteries. --Anterior inferior cerebellar arteries (AICA): Patent origins from the basilar artery. --Basilar artery: Normal. --Superior cerebellar arteries: Normal. --Posterior cerebral arteries: Normal. Both are predominantly supplied by the posterior communicating arteries (p-comm). ANTERIOR CIRCULATION: --Intracranial internal carotid arteries: Normal. --Anterior cerebral arteries (ACA): Normal. Both A1 segments are present. Patent anterior communicating artery (a-comm). --Middle cerebral arteries (MCA): Normal. MRA NECK FINDINGS Normal aortic branch pattern. Right subclavian artery visualized portion is normal. There is no appreciable flow related enhancement within the distal left subclavian artery. There is mild stenosis at the left carotid bifurcation with a short segment enhancement defect of the proximal external carotid artery. Both internal carotid arteries are normal. The vertebral arteries are codominant and patent along their entire visualized course. A short portion of the V3 segments is not visualized due to field of view constraints. IMPRESSION: 1. Contrast-enhancing lesion of the right occipital lobe with surrounding edema is most  likely a primary brain neoplasm, likely a high-grade glioma. 2. Normal intracranial MRA. 3. Mild stenosis of the left carotid bifurcation but otherwise no hemodynamically significant stenosis of the internal carotid or vertebral arteries. 4. Loss of enhancement within the distal left subclavian artery could indicate high-grade stenosis or occlusion. Electronically Signed   By: Ulyses Jarred M.D.   On: 07/01/2019 22:36   MR Angiogram Neck W or Wo Contrast  Result Date: 07/01/2019 CLINICAL DATA:  Headache EXAM: MRI HEAD WITHOUT AND WITH CONTRAST MRA HEAD WITHOUT CONTRAST MRA NECK WITHOUT AND WITH CONTRAST TECHNIQUE: Multiplanar, multiecho pulse sequences of the brain and surrounding structures were obtained without and with intravenous contrast. Angiographic images of the Circle of Willis were obtained using MRA technique without intravenous contrast. Angiographic images of the neck were obtained using MRA technique without and with intravenous contrast. Carotid stenosis measurements (when applicable) are obtained utilizing NASCET criteria, using the distal internal carotid diameter as the denominator. CONTRAST:  33m GADAVIST GADOBUTROL 1 MMOL/ML IV SOLN COMPARISON:  Head CT 07/01/2019 FINDINGS: MRI HEAD FINDINGS Brain: Diffusion abnormality in the right occipital lobe at the site of lesion identified on earlier CT. There are associated magnetic susceptibility effects due to known hemorrhage. There is moderate edema in the right occipital lobe that surround contrast-enhancing lesion, which measures 4.8 x 1.9 cm and extends to the border of the right lateral ventricle occipital horn. There is hemosiderin deposition throughout the lesion. There are no other contrast-enhancing lesions. There is no midline shift. No hydrocephalus. Vascular: Normal flow voids Skull and upper cervical spine: Normal marrow signal Sinuses/Orbits: Negative. Other: None MRA HEAD FINDINGS POSTERIOR CIRCULATION: --Vertebral arteries: Normal  V4 segments. --Posterior inferior cerebellar arteries (PICA): Patent origins from the vertebral arteries. --Anterior inferior cerebellar arteries (AICA): Patent origins from the basilar artery. --Basilar artery: Normal. --Superior cerebellar arteries: Normal. --Posterior cerebral arteries: Normal. Both are predominantly supplied by the posterior communicating arteries (p-comm). ANTERIOR CIRCULATION: --Intracranial internal carotid arteries: Normal. --Anterior cerebral arteries (ACA): Normal. Both A1 segments are present. Patent anterior communicating artery (a-comm). --Middle cerebral arteries (MCA): Normal. MRA NECK FINDINGS Normal  aortic branch pattern. Right subclavian artery visualized portion is normal. There is no appreciable flow related enhancement within the distal left subclavian artery. There is mild stenosis at the left carotid bifurcation with a short segment enhancement defect of the proximal external carotid artery. Both internal carotid arteries are normal. The vertebral arteries are codominant and patent along their entire visualized course. A short portion of the V3 segments is not visualized due to field of view constraints. IMPRESSION: 1. 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 2. Normal intracranial MRA. 3. Mild stenosis of the left carotid bifurcation but otherwise no hemodynamically significant stenosis of the internal carotid or vertebral arteries. 4. Loss of enhancement within the distal left subclavian artery could indicate high-grade stenosis or occlusion. Electronically Signed   By: Ulyses Jarred M.D.   On: 07/01/2019 22:36   MR BRAIN W WO CONTRAST  Result Date: 07/01/2019 CLINICAL DATA:  Headache EXAM: MRI HEAD WITHOUT AND WITH CONTRAST MRA HEAD WITHOUT CONTRAST MRA NECK WITHOUT AND WITH CONTRAST TECHNIQUE: Multiplanar, multiecho pulse sequences of the brain and surrounding  structures were obtained without and with intravenous contrast. Angiographic images of the Circle of Willis were obtained using MRA technique without intravenous contrast. Angiographic images of the neck were obtained using MRA technique without and with intravenous contrast. Carotid stenosis measurements (when applicable) are obtained utilizing NASCET criteria, using the distal internal carotid diameter as the denominator. CONTRAST:  6m GADAVIST GADOBUTROL 1 MMOL/ML IV SOLN COMPARISON:  Head CT 07/01/2019 FINDINGS: MRI HEAD FINDINGS Brain: Diffusion abnormality in the right occipital lobe at the site of lesion identified on earlier CT. There are associated magnetic susceptibility effects due to known hemorrhage. There is moderate edema in the right occipital lobe that surround contrast-enhancing lesion, which measures 4.8 x 1.9 cm and extends to the border of the right lateral ventricle occipital horn. There is hemosiderin deposition throughout the lesion. There are no other contrast-enhancing lesions. There is no midline shift. No hydrocephalus. Vascular: Normal flow voids Skull and upper cervical spine: Normal marrow signal Sinuses/Orbits: Negative. Other: None MRA HEAD FINDINGS POSTERIOR CIRCULATION: --Vertebral arteries: Normal V4 segments. --Posterior inferior cerebellar arteries (PICA): Patent origins from the vertebral arteries. --Anterior inferior cerebellar arteries (AICA): Patent origins from the basilar artery. --Basilar artery: Normal. --Superior cerebellar arteries: Normal. --Posterior cerebral arteries: Normal. Both are predominantly supplied by the posterior communicating arteries (p-comm). ANTERIOR CIRCULATION: --Intracranial internal carotid arteries: Normal. --Anterior cerebral arteries (ACA): Normal. Both A1 segments are present. Patent anterior communicating artery (a-comm). --Middle cerebral arteries (MCA): Normal. MRA NECK FINDINGS Normal aortic branch pattern. Right subclavian artery  visualized portion is normal. There is no appreciable flow related enhancement within the distal left subclavian artery. There is mild stenosis at the left carotid bifurcation with a short segment enhancement defect of the proximal external carotid artery. Both internal carotid arteries are normal. The vertebral arteries are codominant and patent along their entire visualized course. A short portion of the V3 segments is not visualized due to field of view constraints. IMPRESSION: 1. 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 2. Normal intracranial MRA. 3. Mild stenosis of the left carotid bifurcation but otherwise no hemodynamically significant stenosis of the internal carotid or vertebral arteries. 4. Loss of enhancement within the distal left subclavian artery could indicate high-grade stenosis or occlusion. Electronically Signed   By:  Ulyses Jarred M.D.   On: 07/01/2019 22:36   Portable Chest xray  Result Date: 07/05/2019 CLINICAL DATA:  RIGHT occipital mass. EXAM: PORTABLE CHEST 1 VIEW COMPARISON:  Chest radiograph 07/04/2019 FINDINGS: Endotracheal tube unchanged. Normal cardiac silhouette. Midline sternotomy. Low lung volumes. No effusion, infiltrate pneumothorax. IMPRESSION: 1. Stable support apparatus. 2. No interval change. 3. No pulmonary edema.  Low lung volumes. Electronically Signed   By: Suzy Bouchard M.D.   On: 07/05/2019 08:29   Portable Chest x-ray  Result Date: 07/04/2019 CLINICAL DATA:  The tracheal tube placement EXAM: PORTABLE CHEST 1 VIEW COMPARISON:  July 01, 2019 FINDINGS: The endotracheal tube terminates approximately 3.6 cm above the carina. There is no pneumothorax. The heart size is mildly enlarged. The patient is status post prior median sternotomy. Aortic calcifications are noted. There are probable trace bilateral pleural effusions. There is no acute osseous abnormality.  IMPRESSION: 1. Endotracheal tube as above. 2. Probable trace bilateral pleural effusions, otherwise stable appearance of the chest. Electronically Signed   By: Constance Holster M.D.   On: 07/04/2019 19:16   DG Chest Port 1 View  Result Date: 07/01/2019 CLINICAL DATA:  Fever and dizziness. EXAM: PORTABLE CHEST 1 VIEW COMPARISON:  February 16, 2007 FINDINGS: Multiple sternal wires are seen. This represents a new finding when compared to the prior study. There is no evidence of acute infiltrate, pleural effusion or pneumothorax. The heart size and mediastinal contours are within normal limits. There is tortuosity of the descending thoracic aorta. Degenerative changes seen throughout the thoracic spine. IMPRESSION: 1. Interval median sternotomy since the prior study dated February 16, 2007. 2. No acute or active cardiopulmonary disease. Electronically Signed   By: Virgina Norfolk M.D.   On: 07/01/2019 17:48    Pathology: SURGICAL PATHOLOGY SURGICAL PATHOLOGY  CASE: [id number]  PATIENT: Merwyn Katos  Surgical Pathology Report      Clinical History: brain tumor (cm)      FINAL MICROSCOPIC DIAGNOSIS:   A. 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  - See comment.   COMMENT:   Dr. Vicente Males has reviewed the case and concurs with this  interpretation. The case was discussed with [name] on 07/07/2019.  EGFR, IDH1/IDH2, and MGMT testing will be sent out and the results  reported separately.      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  He presents today with clinical, radiographic and histologic features consistent with right occipital glioblastoma.  We extensively discussed features of the disease, including tumor mechanics, tumor genetics, treatment pathways, and prognosis with goals of care.  He did express  interest in pursuing aggressive and safe treatment.    We discussed different approaches to therapy.  Will ultimately recommend iniation of intensity modulated radiation therapy, given over 6 weeks, with concurrent oral Temozolomide dosed at 51m/m2 daily.  We reviewed side effects of this regimen including naseua/vomiting, fatigue, constipation, and cytopenias.  He will pre-treat with oral Zofran 859m30-45 prior to dosing chemotherapy.   Decadron should be decreased to 47m30maily x5 days, then 2mg547mily x5 days, then stopped if possible.  Keppra can be discontinued on 07/21/19 once 14 day post-op window has closed without seizure event.  Post-op MRI still needs to be obtained to accomodate radiation treatment planning.  Consult is arranged with radiation oncology for next week.  Screening for potential clinical trials was  performed and discussed using eligibility criteria for active protocols at [facility], loco-regional tertiary centers, as well as national database available on [email or web address].    The patient is not a candidate for a research protocol at this time due to no suitable study identified.   We spent twenty additional minutes teaching regarding the natural history, biology, and historical experience in the treatment of brain tumors. We then discussed in detail the current recommendations for therapy focusing on the mode of administration, mechanism of action, anticipated toxicities, and quality of life issues associated with this plan. We also provided teaching sheets for the patient to take home as an additional resource.  He will return to clinic during weeks 2, 4 and 6 of IMRT with labs for evaluation, or sooner/more frequent if needed.  All questions were answered. The patient knows to call the clinic with any problems, questions or concerns. No barriers to learning were detected.  The total time spent in the encounter was 60 minutes and more than 50% was on counseling and  review of test results   Ventura Sellers, MD Medical Director of Neuro-Oncology [facility] [place] 07/13/19 11:55 AM

## 2019-07-13 NOTE — Telephone Encounter (Signed)
Spoke with the patient to let him know that a prescription for ativan has been called in to his pharmacy to help him with his upcoming scan.  I let him know that this medication would make him drowsy and he should have someone drive him to and from his scan.  He and his wife verbalized understanding.  Will continue to follow as necessary.  Gloriajean Dell. Leonie Green, BSN

## 2019-07-17 ENCOUNTER — Encounter (HOSPITAL_COMMUNITY): Payer: Self-pay | Admitting: Internal Medicine

## 2019-07-17 ENCOUNTER — Telehealth: Payer: Self-pay | Admitting: *Deleted

## 2019-07-17 NOTE — Telephone Encounter (Signed)
Patient wife called needing clarification on the Decadron Taper and the Keppra discontinuation.  Reviewed with her per Dr. Renda Rolls office notes. No further questions at this time.

## 2019-07-20 ENCOUNTER — Encounter (HOSPITAL_COMMUNITY): Payer: Self-pay | Admitting: Internal Medicine

## 2019-07-22 ENCOUNTER — Telehealth: Payer: Self-pay | Admitting: Internal Medicine

## 2019-07-22 ENCOUNTER — Other Ambulatory Visit: Payer: Self-pay

## 2019-07-22 ENCOUNTER — Ambulatory Visit (HOSPITAL_COMMUNITY)
Admission: RE | Admit: 2019-07-22 | Discharge: 2019-07-22 | Disposition: A | Discharge status: Home / Self Care | Payer: Medicare Other | Source: Ambulatory Visit | Attending: Internal Medicine | Admitting: Internal Medicine

## 2019-07-22 DIAGNOSIS — C719 Malignant neoplasm of brain, unspecified: Secondary | ICD-10-CM | POA: Diagnosis not present

## 2019-07-22 MED ORDER — RIVAROXABAN 20 MG PO TABS: 20.0000 mg | ORAL_TABLET | Freq: Every day | ORAL | 3 refills | Status: DC

## 2019-07-22 MED ORDER — GADOBUTROL 1 MMOL/ML IV SOLN
10.0000 mL | Freq: Once | INTRAVENOUS | Status: AC | PRN
  Administered 2019-07-22: 10 mL via INTRAVENOUS

## 2019-07-22 NOTE — Telephone Encounter (Signed)
Spoke to Dr. 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    Contacted the patient, who denies any new or progressive symptoms.  He denies history of known bleeding events such as GI bleed.  Recommended and ordered oral anticoagulation, Xarelto 20mg  daily.  He will monitor for any complications and let us know.    Will plan to follow up with him again early next week.  Mark Sellers, MD

## 2019-07-24 ENCOUNTER — Inpatient Hospital Stay: Payer: Medicare Other | Attending: Internal Medicine

## 2019-07-24 DIAGNOSIS — R519 Headache, unspecified: Secondary | ICD-10-CM | POA: Insufficient documentation

## 2019-07-24 DIAGNOSIS — H538 Other visual disturbances: Secondary | ICD-10-CM | POA: Insufficient documentation

## 2019-07-24 DIAGNOSIS — C714 Malignant neoplasm of occipital lobe: Secondary | ICD-10-CM | POA: Insufficient documentation

## 2019-07-24 NOTE — Progress Notes (Signed)
Location/Histology of Brain Tumor: Right Occipital glioblastoma  Patient presented with symptoms of:  In early March 2021 [name] had several days of progressive left sided visual impairment.  [name] noticed "missing things" in the left side of his field in his work and while driving.  [name] then developed headaches which were new for him.  Past or anticipated interventions, if any, per neurosurgery:  Dr. Christella Noa -Debulking resection 07/04/2019 07/02/2019 -[name] has presented with a likely tumor in the right occipital lobe. I have recommended resection. [name] can be discharged home after a preop ct today from my standpoint. We will schedule him asap , most likely this week for resection.    Past or anticipated interventions, if any, per medical oncology:  [name] 07/13/2019 -We discussed different approaches to therapy.   -Will ultimately recommend iniation of intensity modulated radiation therapy, given over 6 weeks, with concurrent oral Temozolomide dosed at 75mg /m2 daily.   -We reviewed side effects of this regimen including naseua/vomiting, fatigue, constipation, and cytopenias. -[name] will pre-treat with oral Zofran 8mg  30-45 prior to dosing chemotherapy.  -Keppra can be discontinued on 07/21/19 once 14 day post-op window has closed without seizure event. -Post-op MRI still needs to be obtained to accomodate radiation treatment planning. -Decadron should be decreased to 4mg  daily x5 days, then 2mg  daily x5 days, then stopped if possible.: Should be done with Dex as of 07/23/2019 -[name] will return to clinic during weeks 2, 4 and 6 of IMRT with labs for evaluation, or sooner/more frequent if needed.  Dose of Decadron, if applicable:  4 mg BID  Recent neurologic symptoms, if any:   Seizures:  No  Headaches: Slight headache, tenderness at surgical site.  Nausea: no  Dizziness/ataxia: Episode of dizziness, related to positional change.  Difficulty with hand coordination: no  Focal numbness/weakness:  nothing new.  Visual deficits/changes: Decreased vision in both eyes, peripheral worse on the left.  Confusion/Memory deficits: No  SAFETY ISSUES:  Prior radiation? Prostate radiation 5/12-10/24/2018, Prostate fossa and pelvic lymph nodes were initially treated to 45 Gy in 25 fractions of 1.8 Gy, the prostate fossa only was boosted to 68.4 Gy with 13 additional fractions of 1.8 Gy.  Pacemaker/ICD? no  Possible current pregnancy? n/a  Is the patient on methotrexate? No  Additional Complaints / other details:

## 2019-07-25 ENCOUNTER — Other Ambulatory Visit: Payer: Self-pay

## 2019-07-25 ENCOUNTER — Encounter: Payer: Self-pay | Admitting: Radiation Oncology

## 2019-07-25 ENCOUNTER — Encounter (HOSPITAL_COMMUNITY): Payer: Self-pay | Admitting: Internal Medicine

## 2019-07-25 ENCOUNTER — Ambulatory Visit
Admission: RE | Admit: 2019-07-25 | Discharge: 2019-07-25 | Disposition: A | Discharge status: Home / Self Care | Payer: Medicare Other | Source: Ambulatory Visit | Attending: Radiation Oncology | Admitting: Radiation Oncology

## 2019-07-25 VITALS — BP 129/66 | HR 68 | Temp 99.2°F | Resp 20 | Ht 74.0 in | Wt 221.6 lb

## 2019-07-25 DIAGNOSIS — Z923 Personal history of irradiation: Secondary | ICD-10-CM | POA: Diagnosis not present

## 2019-07-25 DIAGNOSIS — C61 Malignant neoplasm of prostate: Secondary | ICD-10-CM | POA: Diagnosis not present

## 2019-07-25 DIAGNOSIS — C714 Malignant neoplasm of occipital lobe: Secondary | ICD-10-CM | POA: Diagnosis not present

## 2019-07-25 DIAGNOSIS — C719 Malignant neoplasm of brain, unspecified: Secondary | ICD-10-CM

## 2019-07-25 NOTE — Progress Notes (Signed)
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   HISTORY OF PRESENT ILLNESS: [name] is a 74 y.o. male seen at the request of [name] for a newly diagnosed glioblastoma. The patient began having difficulties with visual field deficits on the left. He also developed headaches and was seen in the ED and was found to have a hypodense lesion in the right occipital lobe by CT on 07/01/19. He was seen by Dr. Christella Noa in neurosurgery and on 07/04/19 he underwent surgical resection. Final pathology revealed high grade glioma consistent with glioblastoma. 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 [name] started the patient on Xarelto as a result of the venous findings. He has also already discussed the role of chemoRT is the patient is seen today to discuss options of radiotherapy in the course of his treatment.   PREVIOUS RADIATION THERAPY: No   PAST MEDICAL HISTORY:  Past Medical History:  Diagnosis Date  . Anxiety   . Arthritis   . ASCVD (arteriosclerotic cardiovascular disease)    03/2004: DES x2 to the RCA; IMI with stent occlusion in 02/2005- suboptimal Plavix compliance  . CAD (coronary artery disease)   . Carcinoma of prostate ([place])    Clopidogrel held for biopsy  . Constipation due to pain medication   . Diabetes mellitus without complication ([place])   . Diabetic neuropathy ([place])   . Headache 2021  . Hyperlipidemia   . Hypertension   . Mild depression ([place])   . Morbid obesity ([place])   . Myocardial  infarction ([place])   . Shortness of breath dyspnea   . Sleep apnea    no longer uses cpap  . Stroke ([place]) 2017  . Tobacco abuse    stopped smoking 06/28/09       PAST SURGICAL HISTORY: Past Surgical History:  Procedure Laterality Date  . APPLICATION OF CRANIAL NAVIGATION N/A 07/04/2019   Procedure: APPLICATION OF CRANIAL NAVIGATION;  Surgeon: Ashok Pall, MD;  Location: [facility];  Service: Neurosurgery;  Laterality: N/A;  . BACK SURGERY    . CARDIAC CATHETERIZATION     X 1 stent before having CABG  . CORONARY ARTERY BYPASS GRAFT  06/28/2008   X4  . CRANIOTOMY Right 07/04/2019   Procedure: Right occipital craniotomy for tumor with brainlab;  Surgeon: Ashok Pall, MD;  Location: [facility];  Service: Neurosurgery;  Laterality: Right;  . ENDARTERECTOMY Right 07/03/2015   Procedure: RIGHT CAROTID ENDARTERECTOMY WITH BOVINE PERICARDIUM PATCH ANGIOPLASTY;  Surgeon: Serafina Mitchell, MD;  Location: [facility];  Service: Vascular;  Laterality: Right;  . LUMBAR LAMINECTOMY/DECOMPRESSION MICRODISCECTOMY N/A 09/06/2014   Procedure: LUMBER DECOMPRESSION L3-5;  Surgeon: Melina Schools, MD;  Location: [facility];  Service: Orthopedics;  Laterality: N/A;  . LUMBAR SPINE SURGERY  2002  . PROSTATE BIOPSY    . PROSTATECTOMY  02/2007  . VASECTOMY       FAMILY HISTORY:  Family History  Problem Relation Age of Onset  . Kidney disease Mother   .  Stroke Father   . Breast cancer Sister        breast progressed into bone  . Colon cancer Neg Hx   . Prostate cancer Neg Hx   . Pancreatic cancer Neg Hx      SOCIAL HISTORY:  reports that he quit smoking about 11 years ago. His smoking use included cigarettes. He has a 40.00 pack-year smoking history. He quit smokeless tobacco use about 10 years ago. He reports current alcohol use of about 3.0 standard drinks of alcohol per week. He reports that he does not use drugs. The patient is married and lives in [place]. He's originally from [place] and he and his wife have  been married for 50 years. They have 4 adult children, one of whom is local.   ALLERGIES: Lisinopril   MEDICATIONS:  Current Outpatient Medications  Medication Sig Dispense Refill  . amLODipine (NORVASC) 10 MG tablet TAKE 1 TABLET BY MOUTH DAILY 90 tablet 0  . atorvastatin (LIPITOR) 80 MG tablet TAKE 1 TABLET BY MOUTH DAILY AT 6 PM (Patient taking differently: TAKE 1 TABLET BY MOUTH DAILY AT 6 PM) 90 tablet 1  . baclofen (LIORESAL) 20 MG tablet Take 20 mg by mouth 2 (two) times daily.     . Bempedoic Acid-Ezetimibe (NEXLIZET) 180-10 MG TABS Take 1 tablet by mouth daily. 30 tablet 1  . cilostazol (PLETAL) 50 MG tablet Take 1 tablet (50 mg total) by mouth 2 (two) times daily. 60 tablet 1  . levETIRAcetam (KEPPRA) 500 MG tablet Take 1 tablet (500 mg total) by mouth 2 (two) times daily. 60 tablet 0  . levETIRAcetam (KEPPRA) 500 MG tablet Take 1 tablet (500 mg total) by mouth every 12 (twelve) hours. 60 tablet 1  . LORazepam (ATIVAN) 0.5 MG tablet 1 tab po q 4-6 hours prn or 1 tab po 30 minutes prior to MRI or radiation 30 tablet 0  . losartan-hydrochlorothiazide (HYZAAR) 100-12.5 MG tablet TAKE 1 TABLET BY MOUTH EVERY DAY 90 tablet 0  . Magnesium Hydroxide (DULCOLAX PO) Take 1 tablet by mouth daily as needed (constipation).    . metoprolol succinate (TOPROL-XL) 25 MG 24 hr tablet TAKE 1 TABLET BY MOUTH DAILY (Patient taking differently: Take 25 mg by mouth daily. ) 90 tablet 1  . rivaroxaban (XARELTO) 20 MG TABS tablet Take 1 tablet (20 mg total) by mouth daily with supper. 30 tablet 3   No current facility-administered medications for this encounter.     REVIEW OF SYSTEMS: On review of systems, the patient reports that he is doing well overall. He denies any headaches, and reports his vision has improved and his walking has also improved since treatment. He denies any chest pain, shortness of breath, cough, fevers, chills, night sweats, unintended weight changes. He denies any bowel or bladder  disturbances, and denies abdominal pain, nausea or vomiting. He denies any new musculoskeletal or joint aches or pains. A complete review of systems is obtained and is otherwise negative.     PHYSICAL EXAM:  Wt Readings from Last 3 Encounters:  07/13/19 225 lb (102.1 kg)  07/04/19 237 lb (107.5 kg)  07/01/19 224 lb 10.4 oz (101.9 kg)   Temp Readings from Last 3 Encounters:  07/13/19 98 F (36.7 C) (Temporal)  07/07/19 98.3 F (36.8 C) (Oral)  07/03/19 97.9 F (36.6 C) (Oral)   BP Readings from Last 3 Encounters:  07/13/19 (!) 143/75  07/07/19 (!) 147/71  07/03/19 (!) 147/85   Pulse Readings from Last 3 Encounters:  07/13/19 (!) 58  07/07/19 68  07/03/19 (!) 58     In general this is a well appearing African American male in no acute distress. He's alert and oriented x4 and appropriate throughout the examination. Cardiopulmonary assessment is negative for acute distress and he exhibits normal effort. The incision on his posterior scalp is inspected and is healing well without separation or cellulitic change.    ECOG = 1  0 - Asymptomatic (Fully active, able to carry on all predisease activities without restriction)  1 - Symptomatic but completely ambulatory (Restricted in physically strenuous activity but ambulatory and able to carry out work of a light or sedentary nature. For example, light housework, office work)  2 - Symptomatic, <50% in bed during the day (Ambulatory and capable of all self care but unable to carry out any work activities. Up and about more than 50% of waking hours)  3 - Symptomatic, >50% in bed, but not bedbound (Capable of only limited self-care, confined to bed or chair 50% or more of waking hours)  4 - Bedbound (Completely disabled. Cannot carry on any self-care. Totally confined to bed or chair)  5 - Death   Eustace Pen MM, Creech RH, Tormey DC, et al. [phone]). "Toxicity and response criteria of the [facility]". [place]  Oncol. 5 (6): 649-55    LABORATORY DATA:  Lab Results  Component Value Date   WBC 8.4 07/05/2019   HGB 10.6 (L) 07/05/2019   HCT 31.3 (L) 07/05/2019   MCV 89.4 07/05/2019   PLT 192 07/05/2019   Lab Results  Component Value Date   NA 133 (L) 07/05/2019   K 5.0 07/05/2019   CL 97 (L) 07/05/2019   CO2 24 07/05/2019   Lab Results  Component Value Date   ALT 13 07/01/2019   AST 16 07/01/2019   ALKPHOS 61 07/01/2019   BILITOT 1.1 07/01/2019      RADIOGRAPHY: CT HEAD WO CONTRAST  Result Date: 07/05/2019 CLINICAL DATA:  Stroke follow-up EXAM: CT HEAD WITHOUT CONTRAST TECHNIQUE: Contiguous axial images were obtained from the base of the skull through the vertex without intravenous contrast. COMPARISON:  Four days ago FINDINGS: Brain: Interval right occipital surgery with expected minimal blood products and gas. The degree of presumed edema is unchanged when compared to MRI. No hydrocephalus or mass effect. Vascular: No hyperdense vessel or unexpected calcification. Skull: Unremarkable right occipital craniotomy. Sinuses/Orbits: Negative IMPRESSION: No unexpected finding after right occipital surgery. Electronically Signed   By: Monte Fantasia M.D.   On: 07/05/2019 06:33   CT Head Wo Contrast  Result Date: 07/01/2019 CLINICAL DATA:  Dizziness, weakness, visual disturbances EXAM: CT HEAD WITHOUT CONTRAST TECHNIQUE: Contiguous axial images were obtained from the base of the skull through the vertex without intravenous contrast. COMPARISON:  06/18/2015 FINDINGS: Brain: There is a hypodense lesion of the right occipital lobe with minimal associated subcortical hemorrhage (series 3, image 18). Periventricular white matter hypodensity. Vascular: No hyperdense vessel or unexpected calcification. Skull: Normal. Negative for fracture or focal lesion. Sinuses/Orbits: No acute finding. Other: None. IMPRESSION: Hypodense lesion of the right occipital lobe with minimal associated subcortical hemorrhage,  most consistent with acute to subacute infarction and minimal hemorrhagic transformation. No significant mass effect. Mass or metastatic lesion is a differential consideration. Consider contrast enhanced MRI to further evaluate. Electronically Signed   By: Eddie Candle M.D.   On: 07/01/2019 18:24   Ste[facility] CT Head W Contrast  Result Date: 07/02/2019 CLINICAL DATA:  Right occipital lobe mass. Surgical planning. EXAM: CT HEAD WITH CONTRAST TECHNIQUE: Contiguous axial images were obtained from the base of the skull through the vertex with intravenous contrast. CONTRAST:  187mL OMNIPAQUE IOHEXOL 300 MG/ML  SOLN COMPARISON:  MR head without and with contrast and CT head without contrast 07/01/2019 FINDINGS: Brain: Peripherally enhancing right occipital mass is again noted. The lesion measures 2.6 x 1.8 x 3.3 cm. Enhancing material extends to the ventricle. No new lesions are present. Surrounding edema is again seen. The ventricles are of normal size. No significant extraaxial fluid collection is present. Vascular: Atherosclerotic changes are present within the cavernous internal carotid arteries and at the dural margin of the right vertebral artery. Skull: Calvarium is intact. No focal lytic or blastic lesions are present. No significant extracranial soft tissue lesion is present. Sinuses/Orbits: The paranasal sinuses and mastoid air cells are clear. The globes and orbits are within normal limits. IMPRESSION: 1. Stable appearance of peripherally enhancing right occipital mass measuring 2.6 x 1.8 x 3.3 cm. This remains concerning for a primary brain neoplasm. 2. Enhancing material extends to the ventricle. Electronically Signed   By: San Morelle M.D.   On: 07/02/2019 15:57   MR ANGIO HEAD WO CONTRAST  Result Date: 07/01/2019 CLINICAL DATA:  Headache EXAM: MRI HEAD WITHOUT AND WITH CONTRAST MRA HEAD WITHOUT CONTRAST MRA NECK WITHOUT AND WITH CONTRAST TECHNIQUE: Multiplanar, multiecho pulse sequences of the  brain and surrounding structures were obtained without and with intravenous contrast. Angiographic images of the Circle of Willis were obtained using MRA technique without intravenous contrast. Angiographic images of the neck were obtained using MRA technique without and with intravenous contrast. Carotid stenosis measurements (when applicable) are obtained utilizing NASCET criteria, using the distal internal carotid diameter as the denominator. CONTRAST:  28mL GADAVIST GADOBUTROL 1 MMOL/ML IV SOLN COMPARISON:  Head CT 07/01/2019 FINDINGS: MRI HEAD FINDINGS Brain: Diffusion abnormality in the right occipital lobe at the site of lesion identified on earlier CT. There are associated magnetic susceptibility effects due to known hemorrhage. There is moderate edema in the right occipital lobe that surround contrast-enhancing lesion, which measures 4.8 x 1.9 cm and extends to the border of the right lateral ventricle occipital horn. There is hemosiderin deposition throughout the lesion. There are no other contrast-enhancing lesions. There is no midline shift. No hydrocephalus. Vascular: Normal flow voids Skull and upper cervical spine: Normal marrow signal Sinuses/Orbits: Negative. Other: None MRA HEAD FINDINGS POSTERIOR CIRCULATION: --Vertebral arteries: Normal V4 segments. --Posterior inferior cerebellar arteries (PICA): Patent origins from the vertebral arteries. --Anterior inferior cerebellar arteries (AICA): Patent origins from the basilar artery. --Basilar artery: Normal. --Superior cerebellar arteries: Normal. --Posterior cerebral arteries: Normal. Both are predominantly supplied by the posterior communicating arteries (p-comm). ANTERIOR CIRCULATION: --Intracranial internal carotid arteries: Normal. --Anterior cerebral arteries (ACA): Normal. Both A1 segments are present. Patent anterior communicating artery (a-comm). --Middle cerebral arteries (MCA): Normal. MRA NECK FINDINGS Normal aortic branch pattern. Right  subclavian artery visualized portion is normal. There is no appreciable flow related enhancement within the distal left subclavian artery. There is mild stenosis at the left carotid bifurcation with a short segment enhancement defect of the proximal external carotid artery. Both internal carotid arteries are normal. The vertebral arteries are codominant and patent along their entire visualized course. A short portion of the V3 segments is not visualized due to field of view constraints. IMPRESSION: 1. Contrast-enhancing lesion of the right occipital lobe with surrounding edema is most likely a primary brain neoplasm, likely  a high-grade glioma. 2. Normal intracranial MRA. 3. Mild stenosis of the left carotid bifurcation but otherwise no hemodynamically significant stenosis of the internal carotid or vertebral arteries. 4. Loss of enhancement within the distal left subclavian artery could indicate high-grade stenosis or occlusion. Electronically Signed   By: Ulyses Jarred M.D.   On: 07/01/2019 22:36   MR Angiogram Neck W or Wo Contrast  Result Date: 07/01/2019 CLINICAL DATA:  Headache EXAM: MRI HEAD WITHOUT AND WITH CONTRAST MRA HEAD WITHOUT CONTRAST MRA NECK WITHOUT AND WITH CONTRAST TECHNIQUE: Multiplanar, multiecho pulse sequences of the brain and surrounding structures were obtained without and with intravenous contrast. Angiographic images of the Circle of Willis were obtained using MRA technique without intravenous contrast. Angiographic images of the neck were obtained using MRA technique without and with intravenous contrast. Carotid stenosis measurements (when applicable) are obtained utilizing NASCET criteria, using the distal internal carotid diameter as the denominator. CONTRAST:  84mL GADAVIST GADOBUTROL 1 MMOL/ML IV SOLN COMPARISON:  Head CT 07/01/2019 FINDINGS: MRI HEAD FINDINGS Brain: Diffusion abnormality in the right occipital lobe at the site of lesion identified on earlier CT. There are  associated magnetic susceptibility effects due to known hemorrhage. There is moderate edema in the right occipital lobe that surround contrast-enhancing lesion, which measures 4.8 x 1.9 cm and extends to the border of the right lateral ventricle occipital horn. There is hemosiderin deposition throughout the lesion. There are no other contrast-enhancing lesions. There is no midline shift. No hydrocephalus. Vascular: Normal flow voids Skull and upper cervical spine: Normal marrow signal Sinuses/Orbits: Negative. Other: None MRA HEAD FINDINGS POSTERIOR CIRCULATION: --Vertebral arteries: Normal V4 segments. --Posterior inferior cerebellar arteries (PICA): Patent origins from the vertebral arteries. --Anterior inferior cerebellar arteries (AICA): Patent origins from the basilar artery. --Basilar artery: Normal. --Superior cerebellar arteries: Normal. --Posterior cerebral arteries: Normal. Both are predominantly supplied by the posterior communicating arteries (p-comm). ANTERIOR CIRCULATION: --Intracranial internal carotid arteries: Normal. --Anterior cerebral arteries (ACA): Normal. Both A1 segments are present. Patent anterior communicating artery (a-comm). --Middle cerebral arteries (MCA): Normal. MRA NECK FINDINGS Normal aortic branch pattern. Right subclavian artery visualized portion is normal. There is no appreciable flow related enhancement within the distal left subclavian artery. There is mild stenosis at the left carotid bifurcation with a short segment enhancement defect of the proximal external carotid artery. Both internal carotid arteries are normal. The vertebral arteries are codominant and patent along their entire visualized course. A short portion of the V3 segments is not visualized due to field of view constraints. IMPRESSION: 1. 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 2. Normal intracranial MRA.  3. Mild stenosis of the left carotid bifurcation but otherwise no hemodynamically significant stenosis of the internal carotid or vertebral arteries. 4. Loss of enhancement within the distal left subclavian artery could indicate high-grade stenosis or occlusion. Electronically Signed   By: Ulyses Jarred M.D.   On: 07/01/2019 22:36   MR Brain W Wo Contrast  Result Date: 07/22/2019 CLINICAL DATA:  Postop treatment planning. Glioblastoma status post debulking EXAM: MRI HEAD WITHOUT AND WITH CONTRAST TECHNIQUE: Multiplanar, multiecho pulse sequences of the brain and surrounding structures were obtained without and with intravenous contrast. CONTRAST:  76mL GADAVIST GADOBUTROL 1 MMOL/ML IV SOLN COMPARISON:  07/01/2019 FINDINGS: Brain: Interval debulking of the right occipital mass with cavity containing expected blood products and measuring 27 x 22 mm on axial slices. Peripheral enhancement is very similar  to preoperative scan and extends to the occipital horn of the right lateral ventricle to the cortex. Adjacent T2 hyperintensity is moderate and mildly decreased. No evidence of transependymal tumor spread. No infarct or hydrocephalus. Vascular: Tubular T1 hyperintense filling defect within the right transverse sinus to the level of the upper jugular vein, new from prior. Skull and upper cervical spine: Intact right craniotomy. Sinuses/Orbits: Negative Other: A call has been placed to the on-call provider, please reference the vision for documentation. IMPRESSION: 1. Right occipital glioblastoma with interval debulking. Enhancing tissue is primarily peripheral and shows a similar extent from the cortex to the occipital horn of the right lateral ventricle. 2. Moderate adjacent T2 signal is mildly decreased. 3. 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 Electronically Signed   By: Monte Fantasia M.D.   On: 07/22/2019 13:23   MR BRAIN W WO  CONTRAST  Result Date: 07/01/2019 CLINICAL DATA:  Headache EXAM: MRI HEAD WITHOUT AND WITH CONTRAST MRA HEAD WITHOUT CONTRAST MRA NECK WITHOUT AND WITH CONTRAST TECHNIQUE: Multiplanar, multiecho pulse sequences of the brain and surrounding structures were obtained without and with intravenous contrast. Angiographic images of the Circle of Willis were obtained using MRA technique without intravenous contrast. Angiographic images of the neck were obtained using MRA technique without and with intravenous contrast. Carotid stenosis measurements (when applicable) are obtained utilizing NASCET criteria, using the distal internal carotid diameter as the denominator. CONTRAST:  56mL GADAVIST GADOBUTROL 1 MMOL/ML IV SOLN COMPARISON:  Head CT 07/01/2019 FINDINGS: MRI HEAD FINDINGS Brain: Diffusion abnormality in the right occipital lobe at the site of lesion identified on earlier CT. There are associated magnetic susceptibility effects due to known hemorrhage. There is moderate edema in the right occipital lobe that surround contrast-enhancing lesion, which measures 4.8 x 1.9 cm and extends to the border of the right lateral ventricle occipital horn. There is hemosiderin deposition throughout the lesion. There are no other contrast-enhancing lesions. There is no midline shift. No hydrocephalus. Vascular: Normal flow voids Skull and upper cervical spine: Normal marrow signal Sinuses/Orbits: Negative. Other: None MRA HEAD FINDINGS POSTERIOR CIRCULATION: --Vertebral arteries: Normal V4 segments. --Posterior inferior cerebellar arteries (PICA): Patent origins from the vertebral arteries. --Anterior inferior cerebellar arteries (AICA): Patent origins from the basilar artery. --Basilar artery: Normal. --Superior cerebellar arteries: Normal. --Posterior cerebral arteries: Normal. Both are predominantly supplied by the posterior communicating arteries (p-comm). ANTERIOR CIRCULATION: --Intracranial internal carotid arteries: Normal.  --Anterior cerebral arteries (ACA): Normal. Both A1 segments are present. Patent anterior communicating artery (a-comm). --Middle cerebral arteries (MCA): Normal. MRA NECK FINDINGS Normal aortic branch pattern. Right subclavian artery visualized portion is normal. There is no appreciable flow related enhancement within the distal left subclavian artery. There is mild stenosis at the left carotid bifurcation with a short segment enhancement defect of the proximal external carotid artery. Both internal carotid arteries are normal. The vertebral arteries are codominant and patent along their entire visualized course. A short portion of the V3 segments is not visualized due to field of view constraints. IMPRESSION: 1. 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 2. Normal intracranial MRA. 3. Mild stenosis of the left carotid bifurcation but otherwise no hemodynamically significant stenosis of the internal carotid or vertebral arteries. 4. Loss of enhancement within the distal left subclavian artery could indicate high-grade stenosis or occlusion. Electronically Signed   By: Cletus Gash.D.  On: 07/01/2019 22:36   Portable Chest xray  Result Date: 07/05/2019 CLINICAL DATA:  RIGHT occipital mass. EXAM: PORTABLE CHEST 1 VIEW COMPARISON:  Chest radiograph 07/04/2019 FINDINGS: Endotracheal tube unchanged. Normal cardiac silhouette. Midline sternotomy. Low lung volumes. No effusion, infiltrate pneumothorax. IMPRESSION: 1. Stable support apparatus. 2. No interval change. 3. No pulmonary edema.  Low lung volumes. Electronically Signed   By: Suzy Bouchard M.D.   On: 07/05/2019 08:29   Portable Chest x-ray  Result Date: 07/04/2019 CLINICAL DATA:  The tracheal tube placement EXAM: PORTABLE CHEST 1 VIEW COMPARISON:  July 01, 2019 FINDINGS: The endotracheal tube terminates approximately 3.6 cm above the carina. There  is no pneumothorax. The heart size is mildly enlarged. The patient is status post prior median sternotomy. Aortic calcifications are noted. There are probable trace bilateral pleural effusions. There is no acute osseous abnormality. IMPRESSION: 1. Endotracheal tube as above. 2. Probable trace bilateral pleural effusions, otherwise stable appearance of the chest. Electronically Signed   By: Constance Holster M.D.   On: 07/04/2019 19:16   DG Chest Port 1 View  Result Date: 07/01/2019 CLINICAL DATA:  Fever and dizziness. EXAM: PORTABLE CHEST 1 VIEW COMPARISON:  February 16, 2007 FINDINGS: Multiple sternal wires are seen. This represents a new finding when compared to the prior study. There is no evidence of acute infiltrate, pleural effusion or pneumothorax. The heart size and mediastinal contours are within normal limits. There is tortuosity of the descending thoracic aorta. Degenerative changes seen throughout the thoracic spine. IMPRESSION: 1. Interval median sternotomy since the prior study dated February 16, 2007. 2. No acute or active cardiopulmonary disease. Electronically Signed   By: Virgina Norfolk M.D.   On: 07/01/2019 17:48       IMPRESSION/PLAN: 1. Glioblastoma of the right occipital lobe. Dr. Lisbeth Renshaw discusses the pathology findings and reviews the nature of high grade glioma and treatment strategies of primary brain malignancies. Dr. Lisbeth Renshaw recommends a course of chemoRT for his treatment. We discussed the risks, benefits, short, and long term effects of radiotherapy, and the patient is interested in proceeding. Dr. Lisbeth Renshaw discusses the delivery and logistics of radiotherapy and anticipates a course of 6 weeks of radiotherapy with chemosensitization.  He will simulate on 08/08/19 so he can begin therapy on 08/14/19. Written consent is obtained and placed in the chart, a copy was provided to the patient. 2. Claustrophobia. The patient has a prescription for Ativan to use prn prior to imaging as well  as radiotherapy. We will follow along how he does with this, but he was also shown an immobilizing mask, and he will likely try to navigate treatment without ativan.  In a visit lasting 60 minutes, greater than 50% of the time was spent face to face discussing the patient's condition, in preparation for the discussion, and coordinating the patient's care.   The above documentation reflects my direct findings during this shared patient visit. Please see the separate note by Dr. Lisbeth Renshaw on this date for the remainder of the patient's plan of care.    Carola Rhine, PAC

## 2019-07-26 DIAGNOSIS — H52223 Regular astigmatism, bilateral: Secondary | ICD-10-CM | POA: Diagnosis not present

## 2019-07-26 DIAGNOSIS — H5203 Hypermetropia, bilateral: Secondary | ICD-10-CM | POA: Diagnosis not present

## 2019-07-26 DIAGNOSIS — H53462 Homonymous bilateral field defects, left side: Secondary | ICD-10-CM | POA: Diagnosis not present

## 2019-07-26 DIAGNOSIS — H2513 Age-related nuclear cataract, bilateral: Secondary | ICD-10-CM | POA: Diagnosis not present

## 2019-07-28 ENCOUNTER — Encounter: Payer: Self-pay | Admitting: *Deleted

## 2019-07-28 NOTE — Progress Notes (Signed)
Combine Psychosocial Distress Screening Clinical Social Work  Clinical Social Work was referred by distress screening protocol.  The patient scored a 8 on the Psychosocial Distress Thermometer which indicates severe distress. Clinical Social Worker contacted patient by phone to assess for distress and other psychosocial needs. [name] indicated he rated his nervousness/anxiety high due to just meeting with the healthcare team and coming up with a treatment plan.  Patient feels he is "managing quite well" and indicates no concerns as he feels he has trust in his medical providers.  Mr. Verhoef elaborated on indicating transportation concerns- his wife is willing and able to bring him to all treatments- he indicated a concern because he would prefer to drive himself.  We discussed the loss of independence, patient reported this is very important to him.  He values being able to drive himself, run errands, "tinker in the shop", mow the yard, and other daily activities.  He is hopeful he will be able to resume more of those activities that bring him joy as he recovers from his surgery.    CSW discussed CSW role and supportive programs for both patient and spouse. CSW encouraged patient/spouse to call as needs arise.  ONCBCN DISTRESS SCREENING 07/25/2019  Screening Type Initial Screening  Distress experienced in past week (1-10) 8  Practical problem type Transportation  Emotional problem type Nervousness/Anxiety;Adjusting to illness;Boredom  Physical Problem type Pain;Sleep/insomnia;Getting around;Constipation/diarrhea  Other Contact via phone [phone]    Clinical Social Worker follow up needed: No.  If yes, follow up plan:  Gwinda Maine, LCSW

## 2019-08-03 ENCOUNTER — Other Ambulatory Visit: Payer: Self-pay

## 2019-08-03 ENCOUNTER — Ambulatory Visit: Payer: Medicare Other | Attending: Neurosurgery | Admitting: Occupational Therapy

## 2019-08-03 DIAGNOSIS — H53462 Homonymous bilateral field defects, left side: Secondary | ICD-10-CM

## 2019-08-03 NOTE — Therapy (Signed)
[facility] [address] [place], [place], [address] Phone: [phone]   Fax:  [phone]  Occupational Therapy Evaluation  Patient Details  Name: [name] MRN: [id number] Date of Birth: 1946-01-03 Referring Provider (OT): Dr. Christella Noa   Encounter Date: 08/03/2019  OT End of Session - 08/03/19 1257    Visit Number  1    Number of Visits  13    Date for OT Re-Evaluation  09/19/19    Authorization Type  MCR primary, AARP secondary    Authorization - Visit Number  1    Authorization - Number of Visits  10    Progress Note Due on Visit  10    OT Start Time  1145    OT Stop Time  1235    OT Time Calculation (min)  50 min    Activity Tolerance  Patient tolerated treatment well    Behavior During Therapy  [facility] for tasks assessed/performed       Past Medical History:  Diagnosis Date  . Anxiety   . Arthritis   . ASCVD (arteriosclerotic cardiovascular disease)    03/2004: DES x2 to the RCA; IMI with stent occlusion in 02/2005- suboptimal Plavix compliance  . CAD (coronary artery disease)   . Carcinoma of prostate ([place])    Clopidogrel held for biopsy  . Constipation due to pain medication   . Diabetes mellitus without complication ([place])   . Diabetic neuropathy ([place])   . Headache 2021  . Hyperlipidemia   . Hypertension   . Mild depression ([place])   . Morbid obesity ([place])   . Myocardial infarction ([place])   . Shortness of breath dyspnea   . Sleep apnea    no longer uses cpap  . Stroke ([place]) 2017  . Tobacco abuse    stopped smoking 06/28/09    Past Surgical History:  Procedure Laterality Date  . APPLICATION OF CRANIAL NAVIGATION N/A 07/04/2019   Procedure: APPLICATION OF CRANIAL NAVIGATION;  Surgeon: Ashok Pall, MD;  Location: [facility];  Service: Neurosurgery;  Laterality: N/A;  . BACK SURGERY    . CARDIAC CATHETERIZATION     X 1 stent before having CABG  . CORONARY ARTERY BYPASS GRAFT  06/28/2008   X4   . CRANIOTOMY Right 07/04/2019   Procedure: Right occipital craniotomy for tumor with brainlab;  Surgeon: Ashok Pall, MD;  Location: [facility];  Service: Neurosurgery;  Laterality: Right;  . ENDARTERECTOMY Right 07/03/2015   Procedure: RIGHT CAROTID ENDARTERECTOMY WITH BOVINE PERICARDIUM PATCH ANGIOPLASTY;  Surgeon: Serafina Mitchell, MD;  Location: [facility];  Service: Vascular;  Laterality: Right;  . LUMBAR LAMINECTOMY/DECOMPRESSION MICRODISCECTOMY N/A 09/06/2014   Procedure: LUMBER DECOMPRESSION L3-5;  Surgeon: Melina Schools, MD;  Location: [place];  Service: Orthopedics;  Laterality: N/A;  . LUMBAR SPINE SURGERY  2002  . PROSTATE BIOPSY    . PROSTATECTOMY  02/2007  . VASECTOMY      There were no vitals filed for this visit.  Subjective Assessment - 08/03/19 1154    Subjective   Wife/pt reports pt begins radiation on 08/14/19    Patient is accompanied by:  Family member   wife   Pertinent History  Rt occipital craniotomy 07/04/19 secondary to glioblastoma. 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  No/denies  [facility] OT Assessment - 08/03/19 0001      Assessment   Medical Diagnosis  s/p occipital craniotomy secondary to glioblastoma    Referring Provider (OT)  Dr. Christella Noa    Onset Date/Surgical Date  07/04/19    Hand Dominance  Right    Next MD Visit  oncologist 08/08/19      Precautions   Precaution Comments  no driving      Balance Screen   Has the patient fallen in the past 6 months  No    Has the patient had a decrease in activity level because of a fear of falling?   No      Home  Environment   Astronomer;Door    Additional Comments  Pt lives w/ spouse in 2 story home with bedroom on 2nd floor. Pt has 2 steps to enter house.     Lives With  Spouse      Prior Function    Level of Independence  Independent    Vocation  Retired;Part time employment   working in [employer]    Leisure  yardwork, Office manager      ADL   Eating/Feeding  Independent    Grooming  Modified independent   extra time   Chiropodist - Astronomer -  Control and instrumentation engineer  Independent      IADL   Shopping  Needs to be accompanied on any shopping trip    [place] light daily tasks such as dishwashing, bed making   vacuuming (wife does most laundry)   Meal Prep  Able to complete simple warm meal prep;Able to complete simple cold meal and snack prep    Community Mobility  Relies on family or friends for transportation    Medication Management  Takes responsibility if medication is prepared in advance in seperate dosage   Wife oversees   Financial Management  Requires assistance      Mobility   Mobility Status  Independent      Written Expression   Dominant Hand  Right    Handwriting  --   denies change     Vision - History   Baseline Vision  Wears glasses all the time      Vision Assessment   Ocular Range of Motion  Within Functional Limits    Visual Fields  Left homonymous Hemianopsia   significant - to midline   Diplopia Assessment  Only with left gaze   but bluriness in other ranges     Cognition   Overall Cognitive Status  Within Functional Limits for tasks assessed      Sensation   Additional Comments  no changes, pt has had mild numbness in fingertips prior to this      Coordination   Gross Motor Movements are Fluid and Coordinated  Yes    9 Hole Peg Test  Right;Left    Right 9 Hole Peg Test  27.47 sec    Left 9 Hole Peg Test  34.62 sec      Edema   Edema  none      ROM / Strength   AROM /  PROM / Strength  AROM;Strength  AROM   Overall AROM Comments  BUE AROM WNL's except bilateral index fingers and Rt small finger during composite flinger flexion limited d/t OA      Strength   Overall Strength Comments  BUE MMT grossly 5/5      Hand Function   Right Hand Grip (lbs)  53 lbs    Left Hand Grip (lbs)  66 lbs                      OT Education - 08/03/19 1308    Education Details  explaination of homonymous hemianopsia, OT POC, safety w/ walking, and bigger head turns needed to see items on Lt    Person(s) Educated  Patient;Spouse    Methods  Explanation    Comprehension  Verbalized understanding       OT Short Term Goals - 08/03/19 1304      OT SHORT TERM GOAL #1   Title  Pt to verbalize understanding with visual compensatory strategies to improve daily function including reading and environmental scanning    Time  3    Period  Weeks    Status  New      OT SHORT TERM GOAL #2   Title  Pt to perform tabletop scanning with 90% or greater accuracy using strategies    Time  3    Period  Weeks    Status  New      OT SHORT TERM GOAL #3   Title  Pt will read 1/2 page of text with accuracy, visual aids/AE and extra time PRN    Time  3    Period  Weeks    Status  New        OT Long Term Goals - 08/03/19 1306      OT LONG TERM GOAL #1   Title  Pt will perform environmental scanning at 90% accuracy in mod distracting environment    Time  6    Period  Weeks    Status  New      OT LONG TERM GOAL #2   Title  Pt to read full page of text or greater with visual aids/AE prn    Time  6    Period  Weeks    Status  New      OT LONG TERM GOAL #3   Title  Pt to return to work simulated tasks w/ 90% accuracy or greater (including reading item #'s correctly and scanning for items in store)    Time  6    Period  Weeks    Status  New            Plan - 08/03/19 1258    Clinical Impression Statement  Pt is a 74 y.o. male who presents to  outpatient O.T. s/p Rt occipital craniotomy 07/04/19 secondary to glioblastoma. Pt presents today with main complaint Lt homonymous hemianopsia which is limiting function including reading and safety with IADLS, community integration, and work related tasks. Pt would benefit from O.T. to address this and educate pt in compensatory strategies and scanning strategies to improve function and safety.    OT Occupational Profile and History  Problem Focused Assessment - Including review of records relating to presenting problem    Occupational performance deficits (Please refer to evaluation for details):  IADL's;Work;Leisure;Social Participation;ADL's    Body Structure / Function / Physical Skills  IADL;Vision    Rehab Potential  Good    Clinical Decision Making  Limited treatment options, no task modification necessary    Comorbidities Affecting Occupational Performance:  Presence of comorbidities impacting occupational performance    Comorbidities impacting occupational performance description:  glioblastoma - pt to begin radiation soon    Modification or Assistance to Complete Evaluation   No modification of tasks or assist necessary to complete eval    OT Frequency  2x / week    OT Duration  6 weeks   plus eval. Pt may need to adjust schedule or be placed on hold when pt starts radiation   OT Treatment/Interventions  Self-care/ADL training;Functional Mobility Training;Therapeutic activities;Coping strategies training;Visual/perceptual remediation/compensation;Patient/family education    Plan  visual compensatory strategies and activities to encourage visual scanning    Consulted and Agree with Plan of Care  Patient;Family member/caregiver    Family Member Consulted  wife       Patient will benefit from skilled therapeutic intervention in order to improve the following deficits and impairments:   Body Structure / Function / Physical Skills: IADL, Vision       Visit Diagnosis: Homonymous  hemianopsia, left    Problem List Patient Active Problem List   Diagnosis Date Noted  . Goals of care, counseling/discussion 07/13/2019  . S/P craniotomy 07/04/2019  . Glioblastoma with isocitrate dehydrogenase gene wildtype ([place]) 07/04/2019  . Respiratory failure ([place])   . Encephalopathy acute   . Brain mass 07/01/2019  . Fever 07/01/2019  . Atherosclerosis of native artery of both lower extremities with intermittent claudication ([place]) 08/23/2018  . Coronary artery disease without angina pectoris 08/23/2018  . Dyspnea on exertion 08/23/2018  . Biochemically recurrent malignant neoplasm of prostate 08/16/2018  . S/P carotid endarterectomy 08/20/2015  . Essential hypertension 08/20/2015  . Type 2 diabetes mellitus with circulatory disorder ([place]) 08/20/2015  . HLD (hyperlipidemia) 08/20/2015  . Carotid artery stenosis, symptomatic 07/03/2015  . Carotid stenosis, right   . TIA (transient ischemic attack) 06/18/2015  . ASCVD (arteriosclerotic cardiovascular disease) 06/18/2015  . Diabetes mellitus without complication ([place]) [id number]  . Resistant hypertension 06/18/2015  . Hyperlipidemia 06/18/2015  . Spinal stenosis of lumbar region with neurogenic claudication 09/06/2014  . Atherosclerosis of native artery of extremity with intermittent claudication ([place]) 03/09/2013  . HYPERLIPIDEMIA 04/08/2009  . TOBACCO ABUSE 04/08/2009  . ATHEROSCLEROTIC CARDIOVASCULAR DISEASE 04/08/2009  . INTERMITTENT VERTIGO 04/08/2009  . ADENOCARCINOMA, PROSTATE 03/21/2009  . ABDOMINAL PAIN -GENERALIZED 03/21/2009  . History of coronary artery bypass graft 06/28/2008    Carey Bullocks, OTR/L 08/03/2019, 1:09 PM  [facility] [address] [place], [place], [address] Phone: [phone]   Fax:  [phone]  Name: [name] MRN: [id number] Date of Birth: Jun 05, 1945

## 2019-08-08 ENCOUNTER — Ambulatory Visit
Admission: RE | Admit: 2019-08-08 | Discharge: 2019-08-08 | Disposition: A | Discharge status: Home / Self Care | Payer: Medicare Other | Source: Ambulatory Visit | Attending: Radiation Oncology | Admitting: Radiation Oncology

## 2019-08-08 ENCOUNTER — Ambulatory Visit: Payer: Medicare Other | Admitting: Occupational Therapy

## 2019-08-08 ENCOUNTER — Encounter (HOSPITAL_COMMUNITY): Payer: Self-pay | Admitting: Internal Medicine

## 2019-08-08 ENCOUNTER — Other Ambulatory Visit: Payer: Self-pay | Admitting: Internal Medicine

## 2019-08-08 ENCOUNTER — Other Ambulatory Visit: Payer: Self-pay

## 2019-08-08 DIAGNOSIS — H53462 Homonymous bilateral field defects, left side: Secondary | ICD-10-CM | POA: Diagnosis not present

## 2019-08-08 DIAGNOSIS — C714 Malignant neoplasm of occipital lobe: Secondary | ICD-10-CM | POA: Diagnosis not present

## 2019-08-08 DIAGNOSIS — Z510 Encounter for antineoplastic radiation therapy: Secondary | ICD-10-CM | POA: Diagnosis not present

## 2019-08-08 DIAGNOSIS — C719 Malignant neoplasm of brain, unspecified: Secondary | ICD-10-CM

## 2019-08-08 MED ORDER — ONDANSETRON HCL 8 MG PO TABS: 8.0000 mg | ORAL_TABLET | Freq: Two times a day (BID) | ORAL | 1 refills | Status: DC | PRN

## 2019-08-08 MED ORDER — TEMOZOLOMIDE 140 MG PO CAPS: 140.0000 mg | ORAL_CAPSULE | Freq: Every day | ORAL | 0 refills | Status: DC

## 2019-08-08 MED FILL — ONDANSETRON HCL 8 MG TABLET: 8 | 15 days supply | Qty: 30 | Fill #0

## 2019-08-08 NOTE — Therapy (Signed)
[facility] [address] [place], [place], [address] Phone: [phone]   Fax:  [phone]  Occupational Therapy Treatment  Patient Details  Name: [name] MRN: [id number] Date of Birth: Feb 06, 1946 Referring Provider (OT): Dr. Christella Noa   Encounter Date: 08/08/2019  OT End of Session - 08/08/19 1435    Visit Number  2    Number of Visits  13    Date for OT Re-Evaluation  09/19/19    Authorization Type  MCR primary, AARP secondary    Authorization - Visit Number  2    Authorization - Number of Visits  10    Progress Note Due on Visit  10    OT Start Time  1230    OT Stop Time  1320    OT Time Calculation (min)  50 min    Activity Tolerance  Patient tolerated treatment [name]    Behavior During Therapy  [facility] for tasks assessed/performed       Past Medical History:  Diagnosis Date  . Anxiety   . Arthritis   . ASCVD (arteriosclerotic cardiovascular disease)    03/2004: DES x2 to the RCA; IMI with stent occlusion in 02/2005- suboptimal Plavix compliance  . CAD (coronary artery disease)   . Carcinoma of prostate ([place])    Clopidogrel held for biopsy  . Constipation due to pain medication   . Diabetes mellitus without complication ([place])   . Diabetic neuropathy ([place])   . Headache 2021  . Hyperlipidemia   . Hypertension   . Mild depression ([place])   . Morbid obesity ([place])   . Myocardial infarction ([place])   . Shortness of breath dyspnea   . Sleep apnea    no longer uses cpap  . Stroke ([place]) 2017  . Tobacco abuse    stopped smoking 06/28/09    Past Surgical History:  Procedure Laterality Date  . APPLICATION OF CRANIAL NAVIGATION N/A 07/04/2019   Procedure: APPLICATION OF CRANIAL NAVIGATION;  Surgeon: Ashok Pall, MD;  Location: [facility];  Service: Neurosurgery;  Laterality: N/A;  . BACK SURGERY    . CARDIAC CATHETERIZATION     X 1 stent before having CABG  . CORONARY ARTERY BYPASS GRAFT  06/28/2008   X4  .  CRANIOTOMY Right 07/04/2019   Procedure: Right occipital craniotomy for tumor with brainlab;  Surgeon: Ashok Pall, MD;  Location: [facility];  Service: Neurosurgery;  Laterality: Right;  . ENDARTERECTOMY Right 07/03/2015   Procedure: RIGHT CAROTID ENDARTERECTOMY WITH BOVINE PERICARDIUM PATCH ANGIOPLASTY;  Surgeon: Serafina Mitchell, MD;  Location: [facility];  Service: Vascular;  Laterality: Right;  . LUMBAR LAMINECTOMY/DECOMPRESSION MICRODISCECTOMY N/A 09/06/2014   Procedure: LUMBER DECOMPRESSION L3-5;  Surgeon: Melina Schools, MD;  Location: [facility];  Service: Orthopedics;  Laterality: N/A;  . LUMBAR SPINE SURGERY  2002  . PROSTATE BIOPSY    . PROSTATECTOMY  02/2007  . VASECTOMY      There were no vitals filed for this visit.  Subjective Assessment - 08/08/19 1237    Subjective   Wife now says radiation and chemo pills begins on 08/17/19.    Patient is accompanied by:  Family member   WIFE   Pertinent History  Rt occipital craniotomy 07/04/19 secondary to glioblastoma. 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  No/denies                           OT Education - 08/08/19 1244    Education Details  VIsual scanning strategies, ways to encourage visual scanning, A/E for reading    Person(s) Educated  Patient;Spouse    Methods  Explanation;Handout    Comprehension  Verbalized understanding       OT Short Term Goals - 08/08/19 1436      OT SHORT TERM GOAL #1   Title  Pt to verbalize understanding with visual compensatory strategies to improve daily function including reading and environmental scanning    Time  3    Period  Weeks    Status  On-going      OT SHORT TERM GOAL #2   Title  Pt to perform tabletop scanning with 90% or greater accuracy using strategies     Time  3    Period  Weeks    Status  New      OT SHORT TERM GOAL #3   Title  Pt will read 1/2 page of text with accuracy, visual aids/AE and extra time PRN    Time  3    Period  Weeks    Status  New        OT Long Term Goals - 08/03/19 1306      OT LONG TERM GOAL #1   Title  Pt will perform environmental scanning at 90% accuracy in mod distracting environment    Time  6    Period  Weeks    Status  New      OT LONG TERM GOAL #2   Title  Pt to read full page of text or greater with visual aids/AE prn    Time  6    Period  Weeks    Status  New      OT LONG TERM GOAL #3   Title  Pt to return to work simulated tasks w/ 90% accuracy or greater (including reading item #'s correctly and scanning for items in store)    Time  6    Period  Weeks    Status  New            Plan - 08/08/19 1436    Clinical Impression Statement  Pt with greater understanding of visual compensatory strategies/scanning strategies and need to go slower for greater accuracy    Occupational performance deficits (Please refer to evaluation for details):  IADL's;Work;Leisure;Social Participation;ADL's    Body Structure / Function / Physical Skills  IADL;Vision    Rehab Potential  Good    OT Frequency  2x / week    OT Duration  6 weeks    OT Treatment/Interventions  Self-care/ADL training;Functional Mobility Training;Therapeutic activities;Coping strategies training;Visual/perceptual remediation/compensation;Patient/family education    Plan  visual scanning (letter cancellation, copying #"s), puzzle if time       Patient will benefit from skilled therapeutic intervention in order to improve the following deficits and impairments:   Body Structure / Function / Physical Skills: IADL, Vision       Visit Diagnosis: Homonymous hemianopsia, left    Problem List Patient Active Problem List   Diagnosis Date Noted  . Goals of care, counseling/discussion 07/13/2019  . S/P craniotomy 07/04/2019  .  Glioblastoma with isocitrate dehydrogenase gene wildtype ([place]) 07/04/2019  . Respiratory failure ([place])   .  Encephalopathy acute   . Brain mass 07/01/2019  . Fever 07/01/2019  . Atherosclerosis of native artery of both lower extremities with intermittent claudication ([place]) 08/23/2018  . Coronary artery disease without angina pectoris 08/23/2018  . Dyspnea on exertion 08/23/2018  . Biochemically recurrent malignant neoplasm of prostate 08/16/2018  . S/P carotid endarterectomy 08/20/2015  . Essential hypertension 08/20/2015  . Type 2 diabetes mellitus with circulatory disorder ([place]) 08/20/2015  . HLD (hyperlipidemia) 08/20/2015  . Carotid artery stenosis, symptomatic 07/03/2015  . Carotid stenosis, right   . TIA (transient ischemic attack) 06/18/2015  . ASCVD (arteriosclerotic cardiovascular disease) 06/18/2015  . Diabetes mellitus without complication ([place]) [id number]  . Resistant hypertension 06/18/2015  . Hyperlipidemia 06/18/2015  . Spinal stenosis of lumbar region with neurogenic claudication 09/06/2014  . Atherosclerosis of native artery of extremity with intermittent claudication ([place]) 03/09/2013  . HYPERLIPIDEMIA 04/08/2009  . TOBACCO ABUSE 04/08/2009  . ATHEROSCLEROTIC CARDIOVASCULAR DISEASE 04/08/2009  . INTERMITTENT VERTIGO 04/08/2009  . ADENOCARCINOMA, PROSTATE 03/21/2009  . ABDOMINAL PAIN -GENERALIZED 03/21/2009  . History of coronary artery bypass graft 06/28/2008    Carey Bullocks, OTR/L 08/08/2019, 2:38 PM  [facility] [address] [place], [place], [address] Phone: [phone]   Fax:  [phone]  Name: [name] MRN: [id number] Date of Birth: [birth year]

## 2019-08-08 NOTE — Progress Notes (Signed)
START ON PATHWAY REGIMEN - Neuro     One cycle, daily for 42 days concurrent with RT:     Temozolomide   **Always confirm dose/schedule in your pharmacy ordering system**  Patient Characteristics: Glioblastoma (Grade IV Glioma), Newly Diagnosed / Treatment Naive, Good Performance Status and/or Younger Patient, MGMT Promoter Unmethylated/Unknown Disease Classification: Glioma Disease Classification: Glioblastoma (Grade IV Glioma) Disease Status: Newly Diagnosed / Treatment Naive Performance Status: Good Performance Status and/or Younger Patient MGMT Promoter Methylation Status: Awaiting Test Results Intent of Therapy: Non-Curative / Palliative Intent, Discussed with Patient 

## 2019-08-09 ENCOUNTER — Telehealth: Payer: Self-pay

## 2019-08-09 NOTE — Telephone Encounter (Signed)
Oral Oncology Patient Advocate Encounter  After completing a benefits investigation, prior authorization for Temodar is not required at this time through Medicare B.  Patient's copay is $79.39.  [facility] Phone [phone] Fax [phone] 08/09/2019 10:25 AM

## 2019-08-10 ENCOUNTER — Telehealth: Payer: Self-pay | Admitting: Pharmacist

## 2019-08-10 DIAGNOSIS — C719 Malignant neoplasm of brain, unspecified: Secondary | ICD-10-CM

## 2019-08-10 NOTE — Telephone Encounter (Signed)
Oral Oncology Pharmacist Encounter  Received new prescription for Temodar (temozolomide) for the treatment of GBM in conjunction with radiation, planned duration until the end of radiation. Plan start 08/17/19 along with radiation.  Prescription dose and frequency assessed.   Current medication list in Epic reviewed, no DDIs with Temodar identified.  Prescription has been e-scribed to the [facility] for benefits analysis and approval.  Oral Oncology Clinic will continue to follow for insurance authorization, copayment issues, initial counseling and start date.  Darl Pikes, PharmD, BCPS, BCOP, CPP Hematology/Oncology Clinical Pharmacist [facility]/HP/AP [facility] [phone]  08/10/2019 9:17 PM

## 2019-08-11 ENCOUNTER — Other Ambulatory Visit: Payer: Self-pay

## 2019-08-11 ENCOUNTER — Inpatient Hospital Stay (HOSPITAL_BASED_OUTPATIENT_CLINIC_OR_DEPARTMENT_OTHER): Payer: Medicare Other | Admitting: Internal Medicine

## 2019-08-11 VITALS — BP 140/75 | HR 63 | Temp 98.9°F | Resp 17 | Ht 74.0 in | Wt 226.1 lb

## 2019-08-11 DIAGNOSIS — C719 Malignant neoplasm of brain, unspecified: Secondary | ICD-10-CM

## 2019-08-11 DIAGNOSIS — C714 Malignant neoplasm of occipital lobe: Secondary | ICD-10-CM | POA: Diagnosis not present

## 2019-08-11 DIAGNOSIS — H538 Other visual disturbances: Secondary | ICD-10-CM | POA: Diagnosis not present

## 2019-08-11 DIAGNOSIS — R519 Headache, unspecified: Secondary | ICD-10-CM | POA: Diagnosis not present

## 2019-08-11 NOTE — Progress Notes (Signed)
[facility] [place], [place] [address] [phone]   Interval Evaluation  Date of Service: 08/11/19 Patient Name: [name] Patient MRN: [id number] Patient DOB: [birth year] Provider: Ventura Sellers, MD  Identifying Statement:  [name] is a 74 y.o. male with right occipital glioblastoma    Referring Provider: Maurice Small, MD [facility] [place] [address],  [place] [address]  Oncologic History: Oncology History  Glioblastoma with isocitrate dehydrogenase gene wildtype ([place])  07/04/2019 Surgery   Debulking resection by Dr. Christella Noa; path demonstrates Glioblastoma   08/08/2019 -  Chemotherapy   The patient had [No matching medication found in this treatment plan]  for chemotherapy treatment.      Biomarkers:  MGMT Unmethylated.  IDH 1/2 Wild type.  EGFR Not expressed  TERT Unknown   Interval History:  [name] presents to clinic today to review recent clinical changes.  He describes loud clicking noise at times in his right ear, starting a few days after surgery.  He is concerned this could be something regarding the brain tumor.  In addition, he has been having mild headaches, usually provoked by straining to read due to visual impairment.  Overall his left sided vision is somewhat worse from prior.    H+P (07/13/19) Patient presented in early March 2021 with several days of progressive left sided visual impairment.  He noticed "missing things" in the left side of his field in his work and while driving.  He then developed headaches which were new for him.  Never complained of difficulty walking, speaking, using arms or hands.  CNS imaging demonstrated tumor within right occipital lobe, which was biopsied by Dr. Christella Noa on 07/04/19.  Following surgery, he has no new complaints or progressive deficits.  He may be taking decadron '4mg'$  twice per day.  Otherwise his functional status is only limited  by visual impairment. Worked at [employer] and lives with his wife of 51 years.  Medications: Current Outpatient Medications on File Prior to Visit  Medication Sig Dispense Refill  . amLODipine (NORVASC) 10 MG tablet TAKE 1 TABLET BY MOUTH DAILY 90 tablet 0  . atorvastatin (LIPITOR) 80 MG tablet TAKE 1 TABLET BY MOUTH DAILY AT 6 PM (Patient taking differently: TAKE 1 TABLET BY MOUTH DAILY AT 6 PM) 90 tablet 1  . baclofen (LIORESAL) 20 MG tablet Take 20 mg by mouth 2 (two) times daily.     . Bempedoic Acid-Ezetimibe (NEXLIZET) 180-10 MG TABS Take 1 tablet by mouth daily. (Patient not taking: Reported on 08/03/2019) 30 tablet 1  . cilostazol (PLETAL) 50 MG tablet Take 1 tablet (50 mg total) by mouth 2 (two) times daily. (Patient not taking: Reported on 08/03/2019) 60 tablet 1  . levETIRAcetam (KEPPRA) 500 MG tablet Take 1 tablet (500 mg total) by mouth 2 (two) times daily. (Patient not taking: Reported on 08/03/2019) 60 tablet 0  . levETIRAcetam (KEPPRA) 500 MG tablet Take 1 tablet (500 mg total) by mouth every 12 (twelve) hours. (Patient not taking: Reported on 08/03/2019) 60 tablet 1  . LORazepam (ATIVAN) 0.5 MG tablet 1 tab po q 4-6 hours prn or 1 tab po 30 minutes prior to MRI or radiation 30 tablet 0  . losartan-hydrochlorothiazide (HYZAAR) 100-12.5 MG tablet TAKE 1 TABLET BY MOUTH EVERY DAY 90 tablet 0  . Magnesium Hydroxide (DULCOLAX PO) Take 1 tablet by mouth daily as needed (constipation).    . metoprolol succinate (  TOPROL-XL) 25 MG 24 hr tablet TAKE 1 TABLET BY MOUTH DAILY (Patient taking differently: Take 25 mg by mouth daily. ) 90 tablet 1  . ondansetron (ZOFRAN) 8 MG tablet Take 1 tablet (8 mg total) by mouth 2 (two) times daily as needed (nausea and vomiting). May take 30-60 minutes prior to Temodar administration if nausea/vomiting occurs. 30 tablet 1  . rivaroxaban (XARELTO) 20 MG TABS tablet Take 1 tablet (20 mg total) by mouth daily with supper. 30 tablet 3  . temozolomide  (TEMODAR) 140 MG capsule Take 1 capsule (140 mg total) by mouth daily. May take on an empty stomach to decrease nausea & vomiting. 42 capsule 0   No current facility-administered medications on file prior to visit.    Allergies:  Allergies  Allergen Reactions  . Lisinopril Cough   Past Medical History:  Past Medical History:  Diagnosis Date  . Anxiety   . Arthritis   . ASCVD (arteriosclerotic cardiovascular disease)    03/2004: DES x2 to the RCA; IMI with stent occlusion in 02/2005- suboptimal Plavix compliance  . CAD (coronary artery disease)   . Carcinoma of prostate ([place])    Clopidogrel held for biopsy  . Constipation due to pain medication   . Diabetes mellitus without complication ([place])   . Diabetic neuropathy ([facility])   . Headache 2021  . Hyperlipidemia   . Hypertension   . Mild depression ([place])   . Morbid obesity ([place])   . Myocardial infarction ([place])   . Shortness of breath dyspnea   . Sleep apnea    no longer uses cpap  . Stroke ([place]) 2017  . Tobacco abuse    stopped smoking 06/28/09   Past Surgical History:  Past Surgical History:  Procedure Laterality Date  . APPLICATION OF CRANIAL NAVIGATION N/A 07/04/2019   Procedure: APPLICATION OF CRANIAL NAVIGATION;  Surgeon: Ashok Pall, MD;  Location: [facility];  Service: Neurosurgery;  Laterality: N/A;  . BACK SURGERY    . CARDIAC CATHETERIZATION     X 1 stent before having CABG  . CORONARY ARTERY BYPASS GRAFT  06/28/2008   X4  . CRANIOTOMY Right 07/04/2019   Procedure: Right occipital craniotomy for tumor with brainlab;  Surgeon: Ashok Pall, MD;  Location: [facility];  Service: Neurosurgery;  Laterality: Right;  . ENDARTERECTOMY Right 07/03/2015   Procedure: RIGHT CAROTID ENDARTERECTOMY WITH BOVINE PERICARDIUM PATCH ANGIOPLASTY;  Surgeon: Serafina Mitchell, MD;  Location: [facility];  Service: Vascular;  Laterality: Right;  . LUMBAR LAMINECTOMY/DECOMPRESSION MICRODISCECTOMY N/A 09/06/2014   Procedure: LUMBER DECOMPRESSION L3-5;   Surgeon: Melina Schools, MD;  Location: [facility];  Service: Orthopedics;  Laterality: N/A;  . LUMBAR SPINE SURGERY  2002  . PROSTATE BIOPSY    . PROSTATECTOMY  02/2007  . VASECTOMY     Social History:  Social History   Socioeconomic History  . Marital status: Married    Spouse name: Not on file  . Number of children: 4  . Years of education: Not on file  . Highest education level: Not on file  Occupational History  . Occupation: Technical brewer    Comment: retired  . Occupation: Designer, industrial/product: [employer]    Comment: full time  Tobacco Use  . Smoking status: Former Smoker    Packs/day: 1.00    Years: 40.00    Pack years: 40.00    Types: Cigarettes    Quit date: 06/28/2008    Years since quitting: 11.1  . Smokeless  tobacco: Former Systems developer    Quit date: 06/28/2009  Substance and Sexual Activity  . Alcohol use: Yes    Alcohol/week: 3.0 standard drinks    Types: 2 Glasses of wine, 1 Cans of beer per week    Comment: may have 1 drink a week  . Drug use: No  . Sexual activity: Not on file  Other Topics Concern  . Not on file  Social History Narrative   Married with 3 sons and 1 daughter   Daily caffeine use, 3 per day   Social Determinants of Health   Financial Resource Strain:   . Difficulty of Paying Living Expenses:   Food Insecurity:   . Worried About Charity fundraiser in the Last Year:   . Arboriculturist in the Last Year:   Transportation Needs:   . Film/video editor (Medical):   Marland Kitchen Lack of Transportation (Non-Medical):   Physical Activity:   . Days of Exercise per Week:   . Minutes of Exercise per Session:   Stress:   . Feeling of Stress :   Social Connections:   . Frequency of Communication with Friends and Family:   . Frequency of Social Gatherings with Friends and Family:   . Attends Religious Services:   . Active Member of Clubs or Organizations:   . Attends Archivist Meetings:   Marland Kitchen Marital Status:   Intimate Partner  Violence:   . Fear of Current or Ex-Partner:   . Emotionally Abused:   Marland Kitchen Physically Abused:   . Sexually Abused:    Family History:  Family History  Problem Relation Age of Onset  . Kidney disease Mother   . Stroke Father   . Breast cancer Sister        breast progressed into bone  . Colon cancer Neg Hx   . Prostate cancer Neg Hx   . Pancreatic cancer Neg Hx     Review of Systems: Constitutional: Doesn't report fevers, chills or abnormal weight loss Eyes: Doesn't report blurriness of vision Ears, nose, mouth, throat, and face: Doesn't report sore throat Respiratory: Doesn't report cough, dyspnea or wheezes Cardiovascular: Doesn't report palpitation, chest discomfort  Gastrointestinal:  Doesn't report nausea, constipation, diarrhea GU: Doesn't report incontinence Skin: Doesn't report skin rashes Neurological: Per HPI Musculoskeletal: Doesn't report joint pain Behavioral/Psych: Doesn't report anxiety  Physical Exam: Vitals:   08/11/19 1017  BP: 140/75  Pulse: 63  Resp: 17  Temp: 98.9 F (37.2 C)  SpO2: 97%   KPS: 80. General: Alert, cooperative, pleasant, in no acute distress Head: Normal EENT: No conjunctival injection or scleral icterus.  Lungs: Resp effort normal Cardiac: Regular rate Abdomen: Non-distended abdomen Skin: No rashes cyanosis or petechiae. Extremities: No clubbing or edema  Neurologic Exam: Mental Status: Awake, alert, attentive to examiner. Oriented to self and environment. Language is fluent with intact comprehension.  Cranial Nerves: Visual acuity is grossly normal. Left hemianopia. Extra-ocular movements intact. No ptosis. Face is symmetric Motor: Tone and bulk are normal. Power is full in both arms and legs. Reflexes are symmetric, no pathologic reflexes present.  Sensory: Intact to light touch Gait: Normal.   Labs: I have reviewed the data as listed    Component Value Date/Time   NA 133 (L) 07/05/2019 0340   NA 139 05/04/2019 0954     K 5.0 07/05/2019 0340   CL 97 (L) 07/05/2019 0340   CO2 24 07/05/2019 0340   GLUCOSE 162 (H) 07/05/2019 0340  BUN 26 (H) 07/05/2019 0340   BUN 11 05/04/2019 0954   CREATININE 0.98 07/05/2019 0340   CALCIUM 8.3 (L) 07/05/2019 0340   PROT 7.1 07/01/2019 1801   PROT 6.8 05/04/2019 0954   ALBUMIN 4.1 07/01/2019 1801   ALBUMIN 4.5 05/04/2019 0954   AST 16 07/01/2019 1801   ALT 13 07/01/2019 1801   ALKPHOS 61 07/01/2019 1801   BILITOT 1.1 07/01/2019 1801   BILITOT 0.7 05/04/2019 0954   GFRNONAA >60 07/05/2019 0340   GFRAA >60 07/05/2019 0340   Lab Results  Component Value Date   WBC 8.4 07/05/2019   NEUTROABS 6.9 07/01/2019   HGB 10.6 (L) 07/05/2019   HCT 31.3 (L) 07/05/2019   MCV 89.4 07/05/2019   PLT 192 07/05/2019     Assessment/Plan Glioblastoma with isocitrate dehydrogenase gene wildtype (HCC) [C71.9]    [name] presents today with clinical changes.  Audible clicking appears to be temporally related to surgery and possibly jugular clot formation.  Rate of sound may coincide with heart rate, increasing likelihood that this is a blood flow and pressure artifact from the clot.  Unclear on alternate etiologies.     Headaches are tension type and likely related to visual strain from L hemianopia.  Encouraged continuing to work with OT to manage visual impairments which are not likely to improve during radiation.  Ok to take Tylenol as needed for breakthrough pain.  Will continue off decadron and keppra.  He will return to clinic during weeks 2, 4 and 6 of IMRT with labs for evaluation, or sooner/more frequent if needed.  All questions were answered. The patient knows to call the clinic with any problems, questions or concerns. No barriers to learning were detected.  The total time spent in the encounter was 30 minutes and more than 50% was on counseling and review of test results   Ventura Sellers, MD Medical Director of Neuro-Oncology [facility] at [place] 08/11/19 10:25 AM

## 2019-08-14 NOTE — Telephone Encounter (Signed)
Oral Chemotherapy Pharmacist Encounter  Mr. Boat plans on picking up his Temodar from [facility] tomorrow 08/15/19. He knows to start the Temodar along with his radiation.  Patient Education I spoke with patient for overview of new oral chemotherapy medication: Temodar (temozolomide) for the treatment of GBM in conjunction with radiation, planned duration until the end of radiation. Plan start 08/17/19 along with radiation.   Pt is doing well. Counseled patient on administration, dosing, side effects, monitoring, drug-food interactions, safe handling, storage, and disposal. Patient will take 1 capsule (140 mg total) by mouth daily. May take on an empty stomach to decrease nausea & vomiting.  Side effects include but not limited to: N/V, constipation, decreased wbc.   Reviewed with patient importance of keeping a medication schedule and plan for any missed doses.  Mr. Kluesner voiced understanding and appreciation. All questions answered. Medication handout placed in the mail.  Provided patient with Oral [facility] phone number. Patient knows to call the office with questions or concerns. Oral Chemotherapy Navigation Clinic will continue to follow.  Darl Pikes, PharmD, BCPS, BCOP, CPP Hematology/Oncology Clinical Pharmacist [facility]/HP/AP [facility] [phone]  08/14/2019 3:51 PM

## 2019-08-15 MED FILL — TEMOZOLOMIDE 140 MG CAPS: 140 | 30 days supply | Qty: 30 | Fill #0

## 2019-08-16 ENCOUNTER — Telehealth: Payer: Self-pay | Admitting: Internal Medicine

## 2019-08-16 DIAGNOSIS — Z510 Encounter for antineoplastic radiation therapy: Secondary | ICD-10-CM | POA: Diagnosis not present

## 2019-08-16 DIAGNOSIS — C714 Malignant neoplasm of occipital lobe: Secondary | ICD-10-CM | POA: Diagnosis not present

## 2019-08-16 NOTE — Telephone Encounter (Signed)
Scheduled appt per 4/26 sch message - pt is aware of appt date and time

## 2019-08-17 ENCOUNTER — Ambulatory Visit
Admission: RE | Admit: 2019-08-17 | Discharge: 2019-08-17 | Disposition: A | Discharge status: Home / Self Care | Payer: Medicare Other | Source: Ambulatory Visit | Attending: Radiation Oncology | Admitting: Radiation Oncology

## 2019-08-17 ENCOUNTER — Other Ambulatory Visit: Payer: Self-pay

## 2019-08-17 DIAGNOSIS — Z510 Encounter for antineoplastic radiation therapy: Secondary | ICD-10-CM | POA: Diagnosis not present

## 2019-08-17 DIAGNOSIS — C714 Malignant neoplasm of occipital lobe: Secondary | ICD-10-CM | POA: Diagnosis not present

## 2019-08-18 ENCOUNTER — Ambulatory Visit: Payer: Medicare Other | Admitting: Occupational Therapy

## 2019-08-18 ENCOUNTER — Ambulatory Visit
Admission: RE | Admit: 2019-08-18 | Discharge: 2019-08-18 | Disposition: A | Discharge status: Home / Self Care | Payer: Medicare Other | Source: Ambulatory Visit | Attending: Radiation Oncology | Admitting: Radiation Oncology

## 2019-08-18 ENCOUNTER — Other Ambulatory Visit: Payer: Self-pay

## 2019-08-18 DIAGNOSIS — Z510 Encounter for antineoplastic radiation therapy: Secondary | ICD-10-CM | POA: Diagnosis not present

## 2019-08-18 DIAGNOSIS — C714 Malignant neoplasm of occipital lobe: Secondary | ICD-10-CM | POA: Diagnosis not present

## 2019-08-18 DIAGNOSIS — G9389 Other specified disorders of brain: Secondary | ICD-10-CM

## 2019-08-18 MED ORDER — SONAFINE EX EMUL
1.0000 "application " | Freq: Once | CUTANEOUS | Status: AC
  Administered 2019-08-18: 1 via TOPICAL

## 2019-08-18 NOTE — Progress Notes (Signed)
Pt here for patient teaching.  Pt given Radiation and You booklet, skin care instructions and Sonafine.  Reviewed areas of pertinence such as fatigue, hair loss, nausea and vomiting, skin changes and headache . Pt able to give teach back of to pat skin and use unscented/gentle soap,apply Sonafine bid and avoid applying anything to skin within 4 hours of treatment. Pt verbalizes understanding of information given and will contact nursing with any questions or concerns.     Gloriajean Dell. Leonie Green, BSN

## 2019-08-21 ENCOUNTER — Ambulatory Visit
Admission: RE | Admit: 2019-08-21 | Discharge: 2019-08-21 | Disposition: A | Discharge status: Home / Self Care | Payer: Medicare Other | Source: Ambulatory Visit | Attending: Radiation Oncology | Admitting: Radiation Oncology

## 2019-08-21 ENCOUNTER — Other Ambulatory Visit: Payer: Self-pay

## 2019-08-21 DIAGNOSIS — Z510 Encounter for antineoplastic radiation therapy: Secondary | ICD-10-CM | POA: Insufficient documentation

## 2019-08-21 DIAGNOSIS — C714 Malignant neoplasm of occipital lobe: Secondary | ICD-10-CM | POA: Insufficient documentation

## 2019-08-22 ENCOUNTER — Ambulatory Visit: Payer: Medicare Other | Attending: Neurosurgery | Admitting: Occupational Therapy

## 2019-08-22 ENCOUNTER — Ambulatory Visit
Admission: RE | Admit: 2019-08-22 | Discharge: 2019-08-22 | Disposition: A | Discharge status: Home / Self Care | Payer: Medicare Other | Source: Ambulatory Visit | Attending: Radiation Oncology | Admitting: Radiation Oncology

## 2019-08-22 DIAGNOSIS — R2689 Other abnormalities of gait and mobility: Secondary | ICD-10-CM | POA: Diagnosis not present

## 2019-08-22 DIAGNOSIS — R41844 Frontal lobe and executive function deficit: Secondary | ICD-10-CM | POA: Diagnosis not present

## 2019-08-22 DIAGNOSIS — C714 Malignant neoplasm of occipital lobe: Secondary | ICD-10-CM | POA: Diagnosis not present

## 2019-08-22 DIAGNOSIS — R42 Dizziness and giddiness: Secondary | ICD-10-CM | POA: Insufficient documentation

## 2019-08-22 DIAGNOSIS — H53462 Homonymous bilateral field defects, left side: Secondary | ICD-10-CM | POA: Insufficient documentation

## 2019-08-22 DIAGNOSIS — R41842 Visuospatial deficit: Secondary | ICD-10-CM | POA: Diagnosis not present

## 2019-08-22 DIAGNOSIS — R2681 Unsteadiness on feet: Secondary | ICD-10-CM | POA: Insufficient documentation

## 2019-08-22 DIAGNOSIS — R41840 Attention and concentration deficit: Secondary | ICD-10-CM | POA: Diagnosis not present

## 2019-08-22 DIAGNOSIS — Z510 Encounter for antineoplastic radiation therapy: Secondary | ICD-10-CM | POA: Diagnosis not present

## 2019-08-22 NOTE — Therapy (Signed)
[facility] [address] [place], [place], [address] Phone: [phone]   Fax:  [phone]  Occupational Therapy Treatment  Patient Details  Name: [name] MRN: [id number] Date of Birth: [birth year] Referring Provider (OT): Dr. Christella Noa   Encounter Date: 08/22/2019  OT End of Session - 08/22/19 1013    Visit Number  3    Number of Visits  13    Date for OT Re-Evaluation  09/19/19    Authorization Type  MCR primary, AARP secondary    Authorization - Visit Number  3    Authorization - Number of Visits  10    Progress Note Due on Visit  10    OT Start Time  0930    OT Stop Time  1013    OT Time Calculation (min)  43 min    Activity Tolerance  Patient tolerated treatment well    Behavior During Therapy  [facility] for tasks assessed/performed       Past Medical History:  Diagnosis Date  . Anxiety   . Arthritis   . ASCVD (arteriosclerotic cardiovascular disease)    03/2004: DES x2 to the RCA; IMI with stent occlusion in 02/2005- suboptimal Plavix compliance  . CAD (coronary artery disease)   . Carcinoma of prostate ([place])    Clopidogrel held for biopsy  . Constipation due to pain medication   . Diabetes mellitus without complication ([place])   . Diabetic neuropathy ([place])   . Headache 2021  . Hyperlipidemia   . Hypertension   . Mild depression ([place])   . Morbid obesity ([place])   . Myocardial infarction ([place])   . Shortness of breath dyspnea   . Sleep apnea    no longer uses cpap  . Stroke ([place]) 2017  . Tobacco abuse    stopped smoking 06/28/09    Past Surgical History:  Procedure Laterality Date  . APPLICATION OF CRANIAL NAVIGATION N/A 07/04/2019   Procedure: APPLICATION OF CRANIAL NAVIGATION;  Surgeon: Ashok Pall, MD;  Location: [facility];  Service: Neurosurgery;  Laterality: N/A;  . BACK SURGERY    . CARDIAC CATHETERIZATION     X 1 stent before having CABG  . CORONARY ARTERY BYPASS GRAFT  06/28/2008   X4  .  CRANIOTOMY Right 07/04/2019   Procedure: Right occipital craniotomy for tumor with brainlab;  Surgeon: Ashok Pall, MD;  Location: [facility];  Service: Neurosurgery;  Laterality: Right;  . ENDARTERECTOMY Right 07/03/2015   Procedure: RIGHT CAROTID ENDARTERECTOMY WITH BOVINE PERICARDIUM PATCH ANGIOPLASTY;  Surgeon: Serafina Mitchell, MD;  Location: [facility];  Service: Vascular;  Laterality: Right;  . LUMBAR LAMINECTOMY/DECOMPRESSION MICRODISCECTOMY N/A 09/06/2014   Procedure: LUMBER DECOMPRESSION L3-5;  Surgeon: Melina Schools, MD;  Location: [facility];  Service: Orthopedics;  Laterality: N/A;  . LUMBAR SPINE SURGERY  2002  . PROSTATE BIOPSY    . PROSTATECTOMY  02/2007  . VASECTOMY      There were no vitals filed for this visit.  Subjective Assessment - 08/22/19 0933    Subjective   It's so frustrating, but I'm trying to turn my head more    Patient is accompanied by:  Family member    Pertinent History  Rt occipital craniotomy 07/04/19 secondary to glioblastoma. PMH: Prostate CA, CVA 2017 (no residual deficits), CAD, DM, HTN, Atherosclerosis    Limitations  Lt homonymous hemianopsia - no driving    Special Tests  Pt goes by "[name]"    Patient Stated Goals  reading, return  to work and yardwork    Currently in Pain?  No/denies                   OT Treatments/Exercises (OP) - 08/22/19 0001      Visual/Perceptual Exercises   Word Finding  Simple word search (79M print size) with extra time and cues to slow down. Pt completed 2/3 before session ended    Letter Search  letter cancellation (79M print size) 2 pages with 4 errors (all to Rt side) - ? d/t decreased attention vs. impulsive.     Scanning  Tabletop    Scanning - Tabletop  Copying phone #'s from Lt side to Rt side of page (61M size) with only 1 error omitting last digit of 1 phone number. Simple word search (79M print size) w/ extra time and min cues to re-read errors               OT Short Term Goals - 08/08/19 1436      OT  SHORT TERM GOAL #1   Title  Pt to verbalize understanding with visual compensatory strategies to improve daily function including reading and environmental scanning    Time  3    Period  Weeks    Status  On-going      OT SHORT TERM GOAL #2   Title  Pt to perform tabletop scanning with 90% or greater accuracy using strategies    Time  3    Period  Weeks    Status  New      OT SHORT TERM GOAL #3   Title  Pt will read 1/2 page of text with accuracy, visual aids/AE and extra time PRN    Time  3    Period  Weeks    Status  New        OT Long Term Goals - 08/03/19 1306      OT LONG TERM GOAL #1   Title  Pt will perform environmental scanning at 90% accuracy in mod distracting environment    Time  6    Period  Weeks    Status  New      OT LONG TERM GOAL #2   Title  Pt to read full page of text or greater with visual aids/AE prn    Time  6    Period  Weeks    Status  New      OT LONG TERM GOAL #3   Title  Pt to return to work simulated tasks w/ 90% accuracy or greater (including reading item #'s correctly and scanning for items in store)    Time  6    Period  Weeks    Status  New            Plan - 08/22/19 1014    Clinical Impression Statement  Pt requires cueing to slow down with paper/tabletop scanning. Pt w/ improved accuracy when slowing down    Occupational performance deficits (Please refer to evaluation for details):  IADL's;Work;Leisure;Social Participation;ADL's    Body Structure / Function / Physical Skills  IADL;Vision    Rehab Potential  Good    Comorbidities Affecting Occupational Performance:  Presence of comorbidities impacting occupational performance    Comorbidities impacting occupational performance description:  glioblastoma - pt to begin radiation soon    OT Frequency  2x / week    OT Duration  6 weeks    OT Treatment/Interventions  Self-care/ADL training;Functional Mobility Training;Therapeutic activities;Coping strategies  training;Visual/perceptual remediation/compensation;Patient/family education    Plan  24 pc puzzle, PVC pipe design    Consulted and Agree with Plan of Care  Patient;Family member/caregiver    Family Member Consulted  wife       Patient will benefit from skilled therapeutic intervention in order to improve the following deficits and impairments:   Body Structure / Function / Physical Skills: IADL, Vision       Visit Diagnosis: Homonymous hemianopsia, left    Problem List Patient Active Problem List   Diagnosis Date Noted  . Goals of care, counseling/discussion 07/13/2019  . S/P craniotomy 07/04/2019  . Glioblastoma with isocitrate dehydrogenase gene wildtype ([place]) 07/04/2019  . Respiratory failure ([place])   . Encephalopathy acute   . Brain mass 07/01/2019  . Fever 07/01/2019  . Atherosclerosis of native artery of both lower extremities with intermittent claudication ([place]) 08/23/2018  . Coronary artery disease without angina pectoris 08/23/2018  . Dyspnea on exertion 08/23/2018  . Biochemically recurrent malignant neoplasm of prostate 08/16/2018  . S/P carotid endarterectomy 08/20/2015  . Essential hypertension 08/20/2015  . Type 2 diabetes mellitus with circulatory disorder ([place]) 08/20/2015  . HLD (hyperlipidemia) 08/20/2015  . Carotid artery stenosis, symptomatic 07/03/2015  . Carotid stenosis, right   . TIA (transient ischemic attack) 06/18/2015  . ASCVD (arteriosclerotic cardiovascular disease) 06/18/2015  . Diabetes mellitus without complication ([place]) [id number]  . Resistant hypertension 06/18/2015  . Hyperlipidemia 06/18/2015  . Spinal stenosis of lumbar region with neurogenic claudication 09/06/2014  . Atherosclerosis of native artery of extremity with intermittent claudication ([place]) 03/09/2013  . HYPERLIPIDEMIA 04/08/2009  . TOBACCO ABUSE 04/08/2009  . ATHEROSCLEROTIC CARDIOVASCULAR DISEASE 04/08/2009  . INTERMITTENT VERTIGO 04/08/2009  . ADENOCARCINOMA, PROSTATE  03/21/2009  . ABDOMINAL PAIN -GENERALIZED 03/21/2009  . History of coronary artery bypass graft 06/28/2008    Carey Bullocks, OTR/L 08/22/2019, 11:03 AM  [facility] [address] [place], [place], [address] Phone: [phone]   Fax:  [phone]  Name: SADAMU DISPENZA MRN: [id number] Date of Birth: 1946/03/30

## 2019-08-23 ENCOUNTER — Ambulatory Visit
Admission: RE | Admit: 2019-08-23 | Discharge: 2019-08-23 | Disposition: A | Discharge status: Home / Self Care | Payer: Medicare Other | Source: Ambulatory Visit | Attending: Radiation Oncology | Admitting: Radiation Oncology

## 2019-08-23 ENCOUNTER — Other Ambulatory Visit: Payer: Self-pay

## 2019-08-23 DIAGNOSIS — C714 Malignant neoplasm of occipital lobe: Secondary | ICD-10-CM | POA: Diagnosis not present

## 2019-08-23 DIAGNOSIS — Z510 Encounter for antineoplastic radiation therapy: Secondary | ICD-10-CM | POA: Diagnosis not present

## 2019-08-24 ENCOUNTER — Ambulatory Visit: Payer: Medicare Other | Admitting: Occupational Therapy

## 2019-08-24 ENCOUNTER — Ambulatory Visit
Admission: RE | Admit: 2019-08-24 | Discharge: 2019-08-24 | Disposition: A | Discharge status: Home / Self Care | Payer: Medicare Other | Source: Ambulatory Visit | Attending: Radiation Oncology | Admitting: Radiation Oncology

## 2019-08-24 ENCOUNTER — Other Ambulatory Visit: Payer: Self-pay

## 2019-08-24 DIAGNOSIS — R42 Dizziness and giddiness: Secondary | ICD-10-CM | POA: Diagnosis not present

## 2019-08-24 DIAGNOSIS — H53462 Homonymous bilateral field defects, left side: Secondary | ICD-10-CM

## 2019-08-24 DIAGNOSIS — Z510 Encounter for antineoplastic radiation therapy: Secondary | ICD-10-CM | POA: Diagnosis not present

## 2019-08-24 DIAGNOSIS — R2689 Other abnormalities of gait and mobility: Secondary | ICD-10-CM | POA: Diagnosis not present

## 2019-08-24 DIAGNOSIS — R41842 Visuospatial deficit: Secondary | ICD-10-CM | POA: Diagnosis not present

## 2019-08-24 DIAGNOSIS — R41840 Attention and concentration deficit: Secondary | ICD-10-CM | POA: Diagnosis not present

## 2019-08-24 DIAGNOSIS — R2681 Unsteadiness on feet: Secondary | ICD-10-CM | POA: Diagnosis not present

## 2019-08-24 DIAGNOSIS — C714 Malignant neoplasm of occipital lobe: Secondary | ICD-10-CM | POA: Diagnosis not present

## 2019-08-24 NOTE — Therapy (Signed)
[facility] [address] [place], [place], [address] Phone: [phone]   Fax:  [phone]  Occupational Therapy Treatment  Patient Details  Name: [name] MRN: [id number] Date of Birth: [birth year] Referring Provider (OT): Dr. Christella Noa   Encounter Date: 08/24/2019  OT End of Session - 08/24/19 1349    Visit Number  4    Number of Visits  13    Date for OT Re-Evaluation  09/19/19    Authorization Type  MCR primary, AARP secondary    Authorization - Visit Number  4    Authorization - Number of Visits  10    Progress Note Due on Visit  10    OT Start Time  1150    OT Stop Time  1235    OT Time Calculation (min)  45 min    Activity Tolerance  Patient tolerated treatment well    Behavior During Therapy  [facility] for tasks assessed/performed       Past Medical History:  Diagnosis Date  . Anxiety   . Arthritis   . ASCVD (arteriosclerotic cardiovascular disease)    03/2004: DES x2 to the RCA; IMI with stent occlusion in 02/2005- suboptimal Plavix compliance  . CAD (coronary artery disease)   . Carcinoma of prostate ([place])    Clopidogrel held for biopsy  . Constipation due to pain medication   . Diabetes mellitus without complication ([place])   . Diabetic neuropathy ([place])   . Headache 2021  . Hyperlipidemia   . Hypertension   . Mild depression ([place])   . Morbid obesity ([place])   . Myocardial infarction ([place])   . Shortness of breath dyspnea   . Sleep apnea    no longer uses cpap  . Stroke ([place]) 2017  . Tobacco abuse    stopped smoking 06/28/09    Past Surgical History:  Procedure Laterality Date  . APPLICATION OF CRANIAL NAVIGATION N/A 07/04/2019   Procedure: APPLICATION OF CRANIAL NAVIGATION;  Surgeon: Ashok Pall, MD;  Location: [facility];  Service: Neurosurgery;  Laterality: N/A;  . BACK SURGERY    . CARDIAC CATHETERIZATION     X 1 stent before having CABG  . CORONARY ARTERY BYPASS GRAFT  06/28/2008   X4  .  CRANIOTOMY Right 07/04/2019   Procedure: Right occipital craniotomy for tumor with brainlab;  Surgeon: Ashok Pall, MD;  Location: [facility];  Service: Neurosurgery;  Laterality: Right;  . ENDARTERECTOMY Right 07/03/2015   Procedure: RIGHT CAROTID ENDARTERECTOMY WITH BOVINE PERICARDIUM PATCH ANGIOPLASTY;  Surgeon: Serafina Mitchell, MD;  Location: [facility];  Service: Vascular;  Laterality: Right;  . LUMBAR LAMINECTOMY/DECOMPRESSION MICRODISCECTOMY N/A 09/06/2014   Procedure: LUMBER DECOMPRESSION L3-5;  Surgeon: Melina Schools, MD;  Location: [facility];  Service: Orthopedics;  Laterality: N/A;  . LUMBAR SPINE SURGERY  2002  . PROSTATE BIOPSY    . PROSTATECTOMY  02/2007  . VASECTOMY      There were no vitals filed for this visit.  Subjective Assessment - 08/24/19 1152    Subjective   I may have slight headaches from time to time but not right now    Patient is accompanied by:  Family member    Pertinent History  Rt occipital craniotomy 07/04/19 secondary to glioblastoma. PMH: Prostate CA, CVA 2017 (no residual deficits), CAD, DM, HTN, Atherosclerosis    Limitations  Lt homonymous hemianopsia - no driving    Special Tests  Pt goes by "[name]"    Patient Stated Goals  reading, return to work and yardwork    Currently in Pain?  No/denies       Pt asked to assemble simple PVC pipe design (Fig 2) with max cues needed to organize pieces before hand, mod cues to look far Lt, and demo mod to max difficulty distinguishing b/t B and C pipe pieces. Pt able to complete design however w/ cues for above. Pt then disassembled and took extra time and min cues to put pieces back in proper place before beginning more complex design. Pt then assembled more complex pipe design (Fig 14) w/ continued difficulty distinguishing b/t B and C pipe pieces, but also b/t coupler piece (G) and pipe piece A. Pt also had difficulty w/ remembering where they were on table (to Lt side) despite repetition - appears to have visual memory  deficits, as well as losing place on design multiple times.                       OT Short Term Goals - 08/08/19 1436      OT SHORT TERM GOAL #1   Title  Pt to verbalize understanding with visual compensatory strategies to improve daily function including reading and environmental scanning    Time  3    Period  Weeks    Status  On-going      OT SHORT TERM GOAL #2   Title  Pt to perform tabletop scanning with 90% or greater accuracy using strategies    Time  3    Period  Weeks    Status  New      OT SHORT TERM GOAL #3   Title  Pt will read 1/2 page of text with accuracy, visual aids/AE and extra time PRN    Time  3    Period  Weeks    Status  New        OT Long Term Goals - 08/03/19 1306      OT LONG TERM GOAL #1   Title  Pt will perform environmental scanning at 90% accuracy in mod distracting environment    Time  6    Period  Weeks    Status  New      OT LONG TERM GOAL #2   Title  Pt to read full page of text or greater with visual aids/AE prn    Time  6    Period  Weeks    Status  New      OT LONG TERM GOAL #3   Title  Pt to return to work simulated tasks w/ 90% accuracy or greater (including reading item #'s correctly and scanning for items in store)    Time  6    Period  Weeks    Status  New            Plan - 08/24/19 1350    Clinical Impression Statement  Pt appears to have decreased visual processing and visual memory with functional visual/perceptual tasks.    Occupational performance deficits (Please refer to evaluation for details):  IADL's;Work;Leisure;Social Participation;ADL's    Body Structure / Function / Physical Skills  IADL;Vision    Rehab Potential  Good    Comorbidities impacting occupational performance description:  glioblastoma - pt to begin radiation soon    OT Frequency  2x / week    OT Duration  6 weeks    OT Treatment/Interventions  Self-care/ADL training;Functional Mobility Training;Therapeutic  activities;Coping strategies training;Visual/perceptual remediation/compensation;Patient/family education  Plan  24 pc puzzle    Consulted and Agree with Plan of Care  Patient;Family member/caregiver    Family Member Consulted  wife       Patient will benefit from skilled therapeutic intervention in order to improve the following deficits and impairments:   Body Structure / Function / Physical Skills: IADL, Vision       Visit Diagnosis: Homonymous hemianopsia, left    Problem List Patient Active Problem List   Diagnosis Date Noted  . Goals of care, counseling/discussion 07/13/2019  . S/P craniotomy 07/04/2019  . 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 Respiratory failure ([place])   . Encephalopathy acute   . Brain mass 07/01/2019  . Fever 07/01/2019  . Atherosclerosis of native artery of both lower extremities with intermittent claudication ([place]) 08/23/2018  . Coronary artery disease without angina pectoris 08/23/2018  . Dyspnea on exertion 08/23/2018  . Biochemically recurrent malignant neoplasm of prostate 08/16/2018  . S/P carotid endarterectomy 08/20/2015  . Essential hypertension 08/20/2015  . Type 2 diabetes mellitus with circulatory disorder ([place]) 08/20/2015  . HLD (hyperlipidemia) 08/20/2015  . Carotid artery stenosis, symptomatic 07/03/2015  . Carotid stenosis, right   . TIA (transient ischemic attack) 06/18/2015  . ASCVD (arteriosclerotic cardiovascular disease) 06/18/2015  . Diabetes mellitus without complication ([place]) [id number]  . Resistant hypertension 06/18/2015  . Hyperlipidemia 06/18/2015  . Spinal stenosis of lumbar region with neurogenic claudication 09/06/2014  . Atherosclerosis of native artery of extremity with intermittent claudication ([place]) 03/09/2013  . HYPERLIPIDEMIA 04/08/2009  . TOBACCO ABUSE 04/08/2009  . ATHEROSCLEROTIC CARDIOVASCULAR DISEASE 04/08/2009  . INTERMITTENT VERTIGO 04/08/2009  .  ADENOCARCINOMA, PROSTATE 03/21/2009  . ABDOMINAL PAIN -GENERALIZED 03/21/2009  . History of coronary artery bypass graft 06/28/2008    Carey Bullocks, OTR/L 08/24/2019, 1:51 PM  [facility] [address] [place], [place], [address] Phone: [phone]   Fax:  [phone]  Name: [name] MRN: [id number] Date of Birth: 11-Jun-1945

## 2019-08-25 ENCOUNTER — Other Ambulatory Visit: Payer: Self-pay

## 2019-08-25 ENCOUNTER — Ambulatory Visit
Admission: RE | Admit: 2019-08-25 | Discharge: 2019-08-25 | Disposition: A | Discharge status: Home / Self Care | Payer: Medicare Other | Source: Ambulatory Visit | Attending: Radiation Oncology | Admitting: Radiation Oncology

## 2019-08-25 DIAGNOSIS — C714 Malignant neoplasm of occipital lobe: Secondary | ICD-10-CM | POA: Diagnosis not present

## 2019-08-25 DIAGNOSIS — Z510 Encounter for antineoplastic radiation therapy: Secondary | ICD-10-CM | POA: Diagnosis not present

## 2019-08-28 ENCOUNTER — Encounter (HOSPITAL_COMMUNITY): Payer: Self-pay

## 2019-08-28 ENCOUNTER — Ambulatory Visit (HOSPITAL_COMMUNITY)
Admission: EM | Admit: 2019-08-28 | Discharge: 2019-08-28 | Disposition: A | Discharge status: Home / Self Care | Payer: Medicare Other | Attending: Family Medicine | Admitting: Family Medicine

## 2019-08-28 ENCOUNTER — Ambulatory Visit
Admission: RE | Admit: 2019-08-28 | Discharge: 2019-08-28 | Disposition: A | Discharge status: Home / Self Care | Payer: Medicare Other | Source: Ambulatory Visit | Attending: Radiation Oncology | Admitting: Radiation Oncology

## 2019-08-28 ENCOUNTER — Other Ambulatory Visit: Payer: Self-pay

## 2019-08-28 DIAGNOSIS — Z510 Encounter for antineoplastic radiation therapy: Secondary | ICD-10-CM | POA: Diagnosis not present

## 2019-08-28 DIAGNOSIS — R310 Gross hematuria: Secondary | ICD-10-CM | POA: Insufficient documentation

## 2019-08-28 DIAGNOSIS — C714 Malignant neoplasm of occipital lobe: Secondary | ICD-10-CM | POA: Diagnosis not present

## 2019-08-28 LAB — POCT URINALYSIS DIP (DEVICE)
Bilirubin Urine: NEGATIVE
Glucose, UA: NEGATIVE mg/dL
Ketones, ur: NEGATIVE mg/dL
Leukocytes,Ua: NEGATIVE
Nitrite: NEGATIVE
Protein, ur: NEGATIVE mg/dL
Specific Gravity, Urine: 1.025 (ref 1.005–1.030)
Urobilinogen, UA: 2 mg/dL — ABNORMAL HIGH (ref 0.0–1.0)
pH: 6.5 (ref 5.0–8.0)

## 2019-08-28 MED ORDER — SULFAMETHOXAZOLE-TRIMETHOPRIM 800-160 MG PO TABS: 1.0000 | ORAL_TABLET | Freq: Two times a day (BID) | ORAL | 0 refills | Status: DC

## 2019-08-28 NOTE — ED Provider Notes (Signed)
[facility]    CSN: [id number] Arrival date & time: 08/28/19  1753      History   Chief Complaint Chief Complaint  Patient presents with  . Blood in Urine    HPI [name] is a 74 y.o. male.   HPI  Pleasant gentleman here with his wife for evaluation of painless blood in urine.  Initially it was a dark red color, then lighter red, now he states it is pink.  He has never had any blood in his urine previously.  No history of kidney stones or kidney disease.  He does not have any dysuria frequency or fever to indicate urinary tract infection.  He is status post prostatectomy for prostate cancer last summer, followed by radiation treatment.  He is not bothered with urinary tract infections.  No flank pain.  No abdominal pain.  No fever or chills.  No nausea or vomiting. He is on Xarelto as part of his cancer treatment.  He is under the care of oncology for a glioblastoma diagnosed in March of this year.  Past Medical History:  Diagnosis Date  . Anxiety   . Arthritis   . ASCVD (arteriosclerotic cardiovascular disease)    03/2004: DES x2 to the RCA; IMI with stent occlusion in 02/2005- suboptimal Plavix compliance  . CAD (coronary artery disease)   . Carcinoma of prostate ([place])    Clopidogrel held for biopsy  . Constipation due to pain medication   . Diabetes mellitus without complication ([place])   . Diabetic neuropathy ([place])   . Headache 2021  . Hyperlipidemia   . Hypertension   . Mild depression ([place])   . Morbid obesity ([place])   . Myocardial infarction ([place])   . Shortness of breath dyspnea   . Sleep apnea    no longer uses cpap  . Stroke ([place]) 2017  . Tobacco abuse    stopped smoking 06/28/09    Patient Active Problem List   Diagnosis Date Noted  . Goals of care, counseling/discussion 07/13/2019  . S/P craniotomy 07/04/2019  . 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 Respiratory failure ([place])   . Encephalopathy  acute   . Brain mass 07/01/2019  . Fever 07/01/2019  . Atherosclerosis of native artery of both lower extremities with intermittent claudication ([place]) 08/23/2018  . Coronary artery disease without angina pectoris 08/23/2018  . Dyspnea on exertion 08/23/2018  . Biochemically recurrent malignant neoplasm of prostate 08/16/2018  . S/P carotid endarterectomy 08/20/2015  . Essential hypertension 08/20/2015  . Type 2 diabetes mellitus with circulatory disorder ([place]) 08/20/2015  . HLD (hyperlipidemia) 08/20/2015  . Carotid artery stenosis, symptomatic 07/03/2015  . Carotid stenosis, right   . TIA (transient ischemic attack) 06/18/2015  . ASCVD (arteriosclerotic cardiovascular disease) 06/18/2015  . Diabetes mellitus without complication ([place]) [id number]  . Resistant hypertension 06/18/2015  . Hyperlipidemia 06/18/2015  . Spinal stenosis of lumbar region with neurogenic claudication 09/06/2014  . Atherosclerosis of native artery of extremity with intermittent claudication ([place]) 03/09/2013  . HYPERLIPIDEMIA 04/08/2009  . TOBACCO ABUSE 04/08/2009  . ATHEROSCLEROTIC CARDIOVASCULAR DISEASE 04/08/2009  . INTERMITTENT VERTIGO 04/08/2009  . ADENOCARCINOMA, PROSTATE 03/21/2009  . ABDOMINAL PAIN -GENERALIZED 03/21/2009  . History of coronary artery bypass graft 06/28/2008    Past Surgical History:  Procedure Laterality Date  . APPLICATION OF CRANIAL NAVIGATION N/A 07/04/2019   Procedure: APPLICATION OF CRANIAL NAVIGATION;  Surgeon: Ashok Pall, MD;  Location: [facility];  Service: Neurosurgery;  Laterality: N/A;  .  BACK SURGERY    . CARDIAC CATHETERIZATION     X 1 stent before having CABG  . CORONARY ARTERY BYPASS GRAFT  06/28/2008   X4  . CRANIOTOMY Right 07/04/2019   Procedure: Right occipital craniotomy for tumor with brainlab;  Surgeon: Ashok Pall, MD;  Location: [facility];  Service: Neurosurgery;  Laterality: Right;  . ENDARTERECTOMY Right 07/03/2015   Procedure: RIGHT CAROTID ENDARTERECTOMY  WITH BOVINE PERICARDIUM PATCH ANGIOPLASTY;  Surgeon: Serafina Mitchell, MD;  Location: [facility];  Service: Vascular;  Laterality: Right;  . LUMBAR LAMINECTOMY/DECOMPRESSION MICRODISCECTOMY N/A 09/06/2014   Procedure: LUMBER DECOMPRESSION L3-5;  Surgeon: Melina Schools, MD;  Location: [facility];  Service: Orthopedics;  Laterality: N/A;  . LUMBAR SPINE SURGERY  2002  . PROSTATE BIOPSY    . PROSTATECTOMY  02/2007  . VASECTOMY         Home Medications    Prior to Admission medications   Medication Sig Start Date End Date Taking? Authorizing Provider  amLODipine (NORVASC) 10 MG tablet TAKE 1 TABLET BY MOUTH DAILY 07/06/19   Miquel Dunn, NP  atorvastatin (LIPITOR) 80 MG tablet TAKE 1 TABLET BY MOUTH DAILY AT 6 PM Patient taking differently: TAKE 1 TABLET BY MOUTH DAILY AT 6 PM 12/19/18   Miquel Dunn, NP  baclofen (LIORESAL) 20 MG tablet Take 20 mg by mouth 2 (two) times daily.     [provider]  levETIRAcetam (KEPPRA) 500 MG tablet Take 1 tablet (500 mg total) by mouth 2 (two) times daily. Patient not taking: Reported on 08/03/2019 07/03/19   Dessa Phi, DO  LORazepam (ATIVAN) 0.5 MG tablet 1 tab po q 4-6 hours prn or 1 tab po 30 minutes prior to MRI or radiation 07/12/19   Hayden Pedro, PA-C  losartan-hydrochlorothiazide [facility]) 100-12.5 MG tablet TAKE 1 TABLET BY MOUTH EVERY DAY 07/06/19   Miquel Dunn, NP  Magnesium Hydroxide (DULCOLAX PO) Take 1 tablet by mouth daily as needed (constipation).    [provider]  metoprolol succinate (TOPROL-XL) 25 MG 24 hr tablet TAKE 1 TABLET BY MOUTH DAILY Patient taking differently: Take 25 mg by mouth daily.  09/08/18   Miquel Dunn, NP  rivaroxaban (XARELTO) 20 MG TABS tablet Take 1 tablet (20 mg total) by mouth daily with supper. 07/22/19   Vaslow, Acey Lav, MD  sulfamethoxazole-trimethoprim (BACTRIM DS) 800-160 MG tablet Take 1 tablet by mouth 2 (two) times daily for 7 days. 08/28/19 09/04/19   Raylene Everts, MD  temozolomide (TEMODAR) 140 MG capsule Take 1 capsule (140 mg total) by mouth daily. May take on an empty stomach to decrease nausea & vomiting. 08/08/19   Ventura Sellers, MD    Family History Family History  Problem Relation Age of Onset  . Kidney disease Mother   . Stroke Father   . Breast cancer Sister        breast progressed into bone  . Colon cancer Neg Hx   . Prostate cancer Neg Hx   . Pancreatic cancer Neg Hx     Social History Social History   Tobacco Use  . Smoking status: Former Smoker    Packs/day: 1.00    Years: 40.00    Pack years: 40.00    Types: Cigarettes    Quit date: 06/28/2008    Years since quitting: 11.1  . Smokeless tobacco: Former Systems developer    Quit date: 06/28/2009  Substance Use Topics  . Alcohol use: Yes    Alcohol/week: 3.0  standard drinks    Types: 2 Glasses of wine, 1 Cans of beer per week    Comment: may have 1 drink a week  . Drug use: No     Allergies   Lisinopril   Review of Systems Review of Systems  Constitutional: Negative for chills and fever.  Gastrointestinal: Negative for nausea and vomiting.  Genitourinary: Positive for hematuria. Negative for decreased urine volume, discharge, dysuria, flank pain, frequency and urgency.  Musculoskeletal: Negative for back pain.     Physical Exam Triage Vital Signs ED Triage Vitals  Enc Vitals Group     BP 08/28/19 1846 127/62     Pulse Rate 08/28/19 1846 83     Resp 08/28/19 1846 17     Temp 08/28/19 1846 98.3 F (36.8 C)     Temp Source 08/28/19 1846 Oral     SpO2 08/28/19 1846 95 %     Weight --      Height --      Head Circumference --      Peak Flow --      Pain Score 08/28/19 1845 0     Pain Loc --      Pain Edu? --      Excl. in [place]? --    No data found.  Updated Vital Signs BP 127/62 (BP Location: Right Arm)   Pulse 83   Temp 98.3 F (36.8 C) (Oral)   Resp 17   SpO2 95%      Physical Exam Constitutional:      General: He is not in  acute distress.    Appearance: He is well-developed and normal weight.     Comments: Uses cane  HENT:     Head: Normocephalic and atraumatic.     Nose:     Comments: Mask is in place Eyes:     Conjunctiva/sclera: Conjunctivae normal.     Pupils: Pupils are equal, round, and reactive to light.  Cardiovascular:     Rate and Rhythm: Normal rate and regular rhythm.     Heart sounds: Normal heart sounds.  Pulmonary:     Effort: Pulmonary effort is normal. No respiratory distress.     Breath sounds: Normal breath sounds.  Abdominal:     General: There is no distension.     Palpations: Abdomen is soft.     Tenderness: There is no abdominal tenderness. There is no right CVA tenderness, left CVA tenderness, guarding or rebound.     Comments: Abdomen soft and benign  Musculoskeletal:        General: Normal range of motion.     Cervical back: Normal range of motion and neck supple.  Skin:    General: Skin is warm and dry.  Neurological:     Mental Status: He is alert.  Psychiatric:        Mood and Affect: Mood normal.        Behavior: Behavior normal.      UC Treatments / Results  Labs (all labs ordered are listed, but only abnormal results are displayed) Labs Reviewed  POCT URINALYSIS DIP (DEVICE) - Abnormal; Notable for the following components:      Result Value   Hgb urine dipstick LARGE (*)    Urobilinogen, UA 2.0 (*)    All other components within normal limits  URINE CULTURE    EKG   Radiology No results found.  Procedures Procedures (including critical care time)  Medications Ordered in UC Medications - No data  to display  Initial Impression / Assessment and Plan / UC Course  I have reviewed the triage vital signs and the nursing notes.  Pertinent labs & imaging results that were available during my care of the patient were reviewed by me and considered in my medical decision making (see chart for details).     Reviewed hematuria.  Causes include stone,  infection, cancer, trauma.  He is on Xarelto which will make him bleed a little more easily.  We will start him on an antibiotic because of his chemotherapy for cancer, can cause neutropenia.  I feel this is safer than waiting for the culture report and leaving it untreated. Follow-up with oncology Final Clinical Impressions(s) / UC Diagnoses   Final diagnoses:  Gross hematuria     Discharge Instructions     Drink more water Take the antibiotic septra 2 x a day Stop the antibiotic if the culture is negative Check the test result in My Chart A nurse will call if the culture is positive   ED Prescriptions    Medication Sig Dispense Auth. Provider   sulfamethoxazole-trimethoprim (BACTRIM DS) 800-160 MG tablet Take 1 tablet by mouth 2 (two) times daily for 7 days. 14 tablet Raylene Everts, MD     PDMP not reviewed this encounter.   Raylene Everts, MD 08/28/19 [phone]

## 2019-08-28 NOTE — ED Triage Notes (Signed)
Pt just recently started chemo and radiation on 04/29

## 2019-08-28 NOTE — ED Triage Notes (Signed)
Pt presents with blood in urine since yesterday; pt states he has no abdominal pain or  N/V/D

## 2019-08-28 NOTE — Discharge Instructions (Signed)
Drink more water Take the antibiotic septra 2 x a day Stop the antibiotic if the culture is negative Check the test result in My Chart A nurse will call if the culture is positive

## 2019-08-29 ENCOUNTER — Ambulatory Visit: Payer: Medicare Other | Admitting: Occupational Therapy

## 2019-08-29 ENCOUNTER — Ambulatory Visit
Admission: RE | Admit: 2019-08-29 | Discharge: 2019-08-29 | Disposition: A | Discharge status: Home / Self Care | Payer: Medicare Other | Source: Ambulatory Visit | Attending: Radiation Oncology | Admitting: Radiation Oncology

## 2019-08-29 ENCOUNTER — Other Ambulatory Visit: Payer: Self-pay

## 2019-08-29 DIAGNOSIS — R42 Dizziness and giddiness: Secondary | ICD-10-CM | POA: Diagnosis not present

## 2019-08-29 DIAGNOSIS — R41840 Attention and concentration deficit: Secondary | ICD-10-CM

## 2019-08-29 DIAGNOSIS — R41842 Visuospatial deficit: Secondary | ICD-10-CM | POA: Diagnosis not present

## 2019-08-29 DIAGNOSIS — R2681 Unsteadiness on feet: Secondary | ICD-10-CM | POA: Diagnosis not present

## 2019-08-29 DIAGNOSIS — H53462 Homonymous bilateral field defects, left side: Secondary | ICD-10-CM

## 2019-08-29 DIAGNOSIS — C714 Malignant neoplasm of occipital lobe: Secondary | ICD-10-CM | POA: Diagnosis not present

## 2019-08-29 DIAGNOSIS — Z510 Encounter for antineoplastic radiation therapy: Secondary | ICD-10-CM | POA: Diagnosis not present

## 2019-08-29 DIAGNOSIS — R2689 Other abnormalities of gait and mobility: Secondary | ICD-10-CM | POA: Diagnosis not present

## 2019-08-29 DIAGNOSIS — R41844 Frontal lobe and executive function deficit: Secondary | ICD-10-CM

## 2019-08-29 LAB — URINE CULTURE: Culture: NO GROWTH

## 2019-08-29 NOTE — Therapy (Signed)
[facility] [address] [place], [place], [address] Phone: [phone]   Fax:  [phone]  Occupational Therapy Treatment  Patient Details  Name: [name] MRN: [id number] Date of Birth: February 21, 1946 Referring Provider (OT): Dr. Christella Noa   Encounter Date: 08/29/2019  OT End of Session - 08/29/19 1221    Visit Number  5    Number of Visits  17    Date for OT Re-Evaluation  10/18/19    Authorization - Visit Number  6    Authorization - Number of Visits  10    Progress Note Due on Visit  10    OT Start Time  1100    OT Stop Time  1150    OT Time Calculation (min)  50 min    Activity Tolerance  Patient tolerated treatment well    Behavior During Therapy  [facility] for tasks assessed/performed       Past Medical History:  Diagnosis Date  . Anxiety   . Arthritis   . ASCVD (arteriosclerotic cardiovascular disease)    03/2004: DES x2 to the RCA; IMI with stent occlusion in 02/2005- suboptimal Plavix compliance  . CAD (coronary artery disease)   . Carcinoma of prostate ([place])    Clopidogrel held for biopsy  . Constipation due to pain medication   . Diabetes mellitus without complication ([place])   . Diabetic neuropathy ([place])   . Headache 2021  . Hyperlipidemia   . Hypertension   . Mild depression ([place])   . Morbid obesity ([place])   . Myocardial infarction ([place])   . Shortness of breath dyspnea   . Sleep apnea    no longer uses cpap  . Stroke ([place]) 2017  . Tobacco abuse    stopped smoking 06/28/09    Past Surgical History:  Procedure Laterality Date  . APPLICATION OF CRANIAL NAVIGATION N/A 07/04/2019   Procedure: APPLICATION OF CRANIAL NAVIGATION;  Surgeon: Ashok Pall, MD;  Location: [facility];  Service: Neurosurgery;  Laterality: N/A;  . BACK SURGERY    . CARDIAC CATHETERIZATION     X 1 stent before having CABG  . CORONARY ARTERY BYPASS GRAFT  06/28/2008   X4  . CRANIOTOMY Right 07/04/2019   Procedure: Right  occipital craniotomy for tumor with brainlab;  Surgeon: Ashok Pall, MD;  Location: [facility];  Service: Neurosurgery;  Laterality: Right;  . ENDARTERECTOMY Right 07/03/2015   Procedure: RIGHT CAROTID ENDARTERECTOMY WITH BOVINE PERICARDIUM PATCH ANGIOPLASTY;  Surgeon: Serafina Mitchell, MD;  Location: [facility];  Service: Vascular;  Laterality: Right;  . LUMBAR LAMINECTOMY/DECOMPRESSION MICRODISCECTOMY N/A 09/06/2014   Procedure: LUMBER DECOMPRESSION L3-5;  Surgeon: Melina Schools, MD;  Location: [facility];  Service: Orthopedics;  Laterality: N/A;  . LUMBAR SPINE SURGERY  2002  . PROSTATE BIOPSY    . PROSTATECTOMY  02/2007  . VASECTOMY      There were no vitals filed for this visit.  Subjective Assessment - 08/29/19 1107    Subjective   Pt with decreased balance today and wife asked about P.T. referral - Sent referral via pt/wife to [name] as they see MD on Thursday to sign and return    Patient is accompanied by:  Family member   wife   Pertinent History  Rt occipital craniotomy 07/04/19 secondary to glioblastoma. PMH: Prostate CA, CVA 2017 (no residual deficits), CAD, DM, HTN, Atherosclerosis    Limitations  Lt homonymous hemianopsia - no driving    Special Tests  Pt goes by "  [name]"    Patient Stated Goals  reading, return to work and yardwork    Currently in Pain?  No/denies        P.T. referral requested d/t decreased balance (sent via patient to MD as he sees doctor on Thursday) Pt spent entire 45 minutes assembling 24 pc puzzle with max cueing required d/t decreased visual processing, visual memory, problem solving, correctly orienting pieces, matching colors/size, id and placing corner and edge pieces correctly, and for scanning to Lt side.  Recertification completed today to update treatment diagnoses, extend POC, and adjust/add goals prn.                       OT Short Term Goals - 08/29/19 1222      OT SHORT TERM GOAL #1   Title  Pt to verbalize understanding with  visual compensatory strategies to improve daily function including reading and environmental scanning    Time  4    Period  Weeks    Status  On-going      OT SHORT TERM GOAL #2   Title  Pt to perform tabletop scanning with 90% or greater accuracy using strategies    Time  4    Period  Weeks    Status  New      OT SHORT TERM GOAL #3   Title  Pt will read 1/2 page of text with accuracy, visual aids/AE and extra time PRN    Time  4    Period  Weeks    Status  New      OT SHORT TERM GOAL #4   Title  Pt to don shirt, don shoes and tie shoes I'ly    Time  4    Period  Weeks    Status  New        OT Long Term Goals - 08/29/19 1223      OT LONG TERM GOAL #1   Title  Pt will perform environmental scanning at 90% accuracy in mod distracting environment    Time  8    Period  Weeks    Status  New      OT LONG TERM GOAL #2   Title  Pt to read full page of text or greater with visual aids/AE prn    Time  8    Period  Weeks    Status  New      OT LONG TERM GOAL #3   Title  Pt to return to work simulated tasks w/ 75% accuracy or greater (including reading item #'s correctly and scanning for items in store)    Time  8    Period  Weeks    Status  New      OT LONG TERM GOAL #4   Title  Pt to assemble simple items (puzzles, simple PVC pipe design, home items) with mod cueing or less    Time  8    Period  Weeks    Status  New            Plan - 08/29/19 1225    Clinical Impression Statement  Pt arrived with decreased balance today and wife providing hand held assist - P.T. referral placed. Pt also with extreme difficulty assembling 24 pc puzzle demo decreased scanning, visual processing, problem solving, visual memory. Recertification done today to include additional treatment diagnoses, extend POC, and adjust goals prn.    Occupational performance deficits (Please refer to evaluation for  details):  IADL's;Work;Leisure;Social Participation;ADL's    Body Structure / Function /  Physical Skills  IADL;Endurance;Balance;Vision   visual/perceptual skills   Cognitive Skills  Memory;Problem Solve;Perception;Thought    Rehab Potential  Good    Comorbidities impacting occupational performance description:  glioblastoma - pt has begun radiation    OT Frequency  2x / week    OT Duration  8 weeks    OT Treatment/Interventions  Self-care/ADL training;Functional Mobility Training;Therapeutic activities;Coping strategies training;Visual/perceptual remediation/compensation;Patient/family education;Cognitive remediation/compensation    Plan  work on strategies for donning shirt and shoes, tying shoes, schedule P.T. evaluation if referral returned/signed, schedule more O.T. visits prn    Consulted and Agree with Plan of Care  Patient;Family member/caregiver    Family Member Consulted  wife       Patient will benefit from skilled therapeutic intervention in order to improve the following deficits and impairments:   Body Structure / Function / Physical Skills: IADL, Endurance, Balance, Vision(visual/perceptual skills) Cognitive Skills: Memory, Problem Solve, Perception, Thought     Visit Diagnosis: Visuospatial deficit  Homonymous hemianopsia, left  Attention and concentration deficit  Unsteadiness on feet  Frontal lobe and executive function deficit    Problem List Patient Active Problem List   Diagnosis Date Noted  . Goals of care, counseling/discussion 07/13/2019  . S/P craniotomy 07/04/2019  . 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 Respiratory failure ([place])   . Encephalopathy acute   . Brain mass 07/01/2019  . Fever 07/01/2019  . Atherosclerosis of native artery of both lower extremities with intermittent claudication ([place]) 08/23/2018  . Coronary artery disease without angina pectoris 08/23/2018  . Dyspnea on exertion 08/23/2018  . Biochemically recurrent malignant neoplasm of prostate 08/16/2018  . S/P carotid  endarterectomy 08/20/2015  . Essential hypertension 08/20/2015  . Type 2 diabetes mellitus with circulatory disorder ([place]) 08/20/2015  . HLD (hyperlipidemia) 08/20/2015  . Carotid artery stenosis, symptomatic 07/03/2015  . Carotid stenosis, right   . TIA (transient ischemic attack) 06/18/2015  . ASCVD (arteriosclerotic cardiovascular disease) 06/18/2015  . Diabetes mellitus without complication ([place]) [id number]  . Resistant hypertension 06/18/2015  . Hyperlipidemia 06/18/2015  . Spinal stenosis of lumbar region with neurogenic claudication 09/06/2014  . Atherosclerosis of native artery of extremity with intermittent claudication ([place]) 03/09/2013  . HYPERLIPIDEMIA 04/08/2009  . TOBACCO ABUSE 04/08/2009  . ATHEROSCLEROTIC CARDIOVASCULAR DISEASE 04/08/2009  . INTERMITTENT VERTIGO 04/08/2009  . ADENOCARCINOMA, PROSTATE 03/21/2009  . ABDOMINAL PAIN -GENERALIZED 03/21/2009  . History of coronary artery bypass graft 06/28/2008    Carey Bullocks, OTR/L 08/29/2019, 1:28 PM  [facility] [address] [place], [place], [address] Phone: [phone]   Fax:  [phone]  Name: ZYMIER MATSUURA MRN: [id number] Date of Birth: 02-18-1946

## 2019-08-30 ENCOUNTER — Other Ambulatory Visit: Payer: Self-pay

## 2019-08-30 ENCOUNTER — Ambulatory Visit
Admission: RE | Admit: 2019-08-30 | Discharge: 2019-08-30 | Disposition: A | Discharge status: Home / Self Care | Payer: Medicare Other | Source: Ambulatory Visit | Attending: Radiation Oncology | Admitting: Radiation Oncology

## 2019-08-30 DIAGNOSIS — Z510 Encounter for antineoplastic radiation therapy: Secondary | ICD-10-CM | POA: Diagnosis not present

## 2019-08-30 DIAGNOSIS — C714 Malignant neoplasm of occipital lobe: Secondary | ICD-10-CM | POA: Diagnosis not present

## 2019-08-31 ENCOUNTER — Inpatient Hospital Stay: Payer: Medicare Other

## 2019-08-31 ENCOUNTER — Other Ambulatory Visit: Payer: Self-pay

## 2019-08-31 ENCOUNTER — Inpatient Hospital Stay: Payer: Medicare Other | Attending: Internal Medicine | Admitting: Internal Medicine

## 2019-08-31 ENCOUNTER — Ambulatory Visit
Admission: RE | Admit: 2019-08-31 | Discharge: 2019-08-31 | Disposition: A | Discharge status: Home / Self Care | Payer: Medicare Other | Source: Ambulatory Visit | Attending: Radiation Oncology | Admitting: Radiation Oncology

## 2019-08-31 ENCOUNTER — Ambulatory Visit: Payer: Medicare Other | Admitting: Occupational Therapy

## 2019-08-31 VITALS — BP 134/70 | HR 73 | Temp 98.7°F | Resp 18 | Ht 74.0 in | Wt 227.6 lb

## 2019-08-31 DIAGNOSIS — C719 Malignant neoplasm of brain, unspecified: Secondary | ICD-10-CM

## 2019-08-31 DIAGNOSIS — R2681 Unsteadiness on feet: Secondary | ICD-10-CM | POA: Diagnosis not present

## 2019-08-31 DIAGNOSIS — Z87891 Personal history of nicotine dependence: Secondary | ICD-10-CM | POA: Insufficient documentation

## 2019-08-31 DIAGNOSIS — Z79899 Other long term (current) drug therapy: Secondary | ICD-10-CM | POA: Insufficient documentation

## 2019-08-31 DIAGNOSIS — Z803 Family history of malignant neoplasm of breast: Secondary | ICD-10-CM | POA: Diagnosis not present

## 2019-08-31 DIAGNOSIS — H538 Other visual disturbances: Secondary | ICD-10-CM | POA: Insufficient documentation

## 2019-08-31 DIAGNOSIS — R2689 Other abnormalities of gait and mobility: Secondary | ICD-10-CM | POA: Insufficient documentation

## 2019-08-31 DIAGNOSIS — R319 Hematuria, unspecified: Secondary | ICD-10-CM | POA: Diagnosis not present

## 2019-08-31 DIAGNOSIS — C714 Malignant neoplasm of occipital lobe: Secondary | ICD-10-CM | POA: Diagnosis not present

## 2019-08-31 DIAGNOSIS — R41840 Attention and concentration deficit: Secondary | ICD-10-CM

## 2019-08-31 DIAGNOSIS — H53462 Homonymous bilateral field defects, left side: Secondary | ICD-10-CM | POA: Diagnosis not present

## 2019-08-31 DIAGNOSIS — R41842 Visuospatial deficit: Secondary | ICD-10-CM

## 2019-08-31 DIAGNOSIS — R42 Dizziness and giddiness: Secondary | ICD-10-CM | POA: Diagnosis not present

## 2019-08-31 DIAGNOSIS — Z510 Encounter for antineoplastic radiation therapy: Secondary | ICD-10-CM | POA: Diagnosis not present

## 2019-08-31 LAB — CBC WITH DIFFERENTIAL (CANCER CENTER ONLY)
Abs Immature Granulocytes: 0.02 10*3/uL (ref 0.00–0.07)
Basophils Absolute: 0 10*3/uL (ref 0.0–0.1)
Basophils Relative: 1 %
Eosinophils Absolute: 0.1 10*3/uL (ref 0.0–0.5)
Eosinophils Relative: 2 %
HCT: 34 % — ABNORMAL LOW (ref 39.0–52.0)
Hemoglobin: 11.1 g/dL — ABNORMAL LOW (ref 13.0–17.0)
Immature Granulocytes: 0 %
Lymphocytes Relative: 10 %
Lymphs Abs: 0.5 10*3/uL — ABNORMAL LOW (ref 0.7–4.0)
MCH: 28.6 pg (ref 26.0–34.0)
MCHC: 32.6 g/dL (ref 30.0–36.0)
MCV: 87.6 fL (ref 80.0–100.0)
Monocytes Absolute: 0.6 10*3/uL (ref 0.1–1.0)
Monocytes Relative: 14 %
Neutro Abs: 3.3 10*3/uL (ref 1.7–7.7)
Neutrophils Relative %: 73 %
Platelet Count: 176 10*3/uL (ref 150–400)
RBC: 3.88 MIL/uL — ABNORMAL LOW (ref 4.22–5.81)
RDW: 13 % (ref 11.5–15.5)
WBC Count: 4.5 10*3/uL (ref 4.0–10.5)
nRBC: 0 % (ref 0.0–0.2)

## 2019-08-31 LAB — CMP (CANCER CENTER ONLY)
ALT: 8 U/L (ref 0–44)
AST: 13 U/L — ABNORMAL LOW (ref 15–41)
Albumin: 3.7 g/dL (ref 3.5–5.0)
Alkaline Phosphatase: 87 U/L (ref 38–126)
Anion gap: 10 (ref 5–15)
BUN: 14 mg/dL (ref 8–23)
CO2: 29 mmol/L (ref 22–32)
Calcium: 9.4 mg/dL (ref 8.9–10.3)
Chloride: 99 mmol/L (ref 98–111)
Creatinine: 1.06 mg/dL (ref 0.61–1.24)
GFR, Est AFR Am: >60 mL/min (ref >60)
GFR, Estimated: >60 mL/min (ref >60)
Glucose, Bld: 146 mg/dL — ABNORMAL HIGH (ref 70–99)
Potassium: 4.3 mmol/L (ref 3.5–5.1)
Sodium: 138 mmol/L (ref 135–145)
Total Bilirubin: 0.9 mg/dL (ref 0.3–1.2)
Total Protein: 7.1 g/dL (ref 6.5–8.1)

## 2019-08-31 MED ORDER — DEXAMETHASONE 2 MG PO TABS: 2.0000 mg | ORAL_TABLET | Freq: Every day | ORAL | 0 refills | Status: DC

## 2019-08-31 NOTE — Therapy (Signed)
[facility] [address] [place], [place], [address] Phone: [phone]   Fax:  [phone]  Occupational Therapy Treatment  Patient Details  Name: [name] MRN: [id number] Date of Birth: 1946-04-12 Referring Zoya Sprecher (OT): Dr. Christella Noa   Encounter Date: 08/31/2019  OT End of Session - 08/31/19 1732    Visit Number  6    Number of Visits  17    Date for OT Re-Evaluation  10/18/19    Authorization Type  MCR primary, A[employer] secondary    Authorization - Visit Number  7    Authorization - Number of Visits  10    Progress Note Due on Visit  10    OT Start Time  1103    OT Stop Time  1148    OT Time Calculation (min)  45 min    Activity Tolerance  Patient tolerated treatment well    Behavior During Therapy  [facility] for tasks assessed/performed       Past Medical History:  Diagnosis Date  . Anxiety   . Arthritis   . ASCVD (arteriosclerotic cardiovascular disease)    03/2004: DES x2 to the RCA; IMI with stent occlusion in 02/2005- suboptimal Plavix compliance  . CAD (coronary artery disease)   . Carcinoma of prostate ([place])    Clopidogrel held for biopsy  . Constipation due to pain medication   . Diabetes mellitus without complication ([place])   . Diabetic neuropathy ([place])   . Headache 2021  . Hyperlipidemia   . Hypertension   . Mild depression ([place])   . Morbid obesity ([place])   . Myocardial infarction ([place])   . Shortness of breath dyspnea   . Sleep apnea    no longer uses cpap  . Stroke ([place]) 2017  . Tobacco abuse    stopped smoking 06/28/09    Past Surgical History:  Procedure Laterality Date  . APPLICATION OF CRANIAL NAVIGATION N/A 07/04/2019   Procedure: APPLICATION OF CRANIAL NAVIGATION;  Surgeon: Ashok Pall, MD;  Location: [facility];  Service: Neurosurgery;  Laterality: N/A;  . BACK SURGERY    . CARDIAC CATHETERIZATION     X 1 stent before having CABG  . CORONARY ARTERY BYPASS GRAFT  06/28/2008   X4  .  CRANIOTOMY Right 07/04/2019   Procedure: Right occipital craniotomy for tumor with brainlab;  Surgeon: Ashok Pall, MD;  Location: [facility];  Service: Neurosurgery;  Laterality: Right;  . ENDARTERECTOMY Right 07/03/2015   Procedure: RIGHT CAROTID ENDARTERECTOMY WITH BOVINE PERICARDIUM PATCH ANGIOPLASTY;  Surgeon: Serafina Mitchell, MD;  Location: [facility];  Service: Vascular;  Laterality: Right;  . LUMBAR LAMINECTOMY/DECOMPRESSION MICRODISCECTOMY N/A 09/06/2014   Procedure: LUMBER DECOMPRESSION L3-5;  Surgeon: Melina Schools, MD;  Location: [facility];  Service: Orthopedics;  Laterality: N/A;  . LUMBAR SPINE SURGERY  2002  . PROSTATE BIOPSY    . PROSTATECTOMY  02/2007  . VASECTOMY      There were no vitals filed for this visit.  Subjective Assessment - 08/31/19 1106    Subjective   I have the most trouble with my underneath shirt    Patient is accompanied by:  Family member   wife   Pertinent History  Rt occipital craniotomy 07/04/19 secondary to glioblastoma. PMH: Prostate CA, CVA 2017 (no residual deficits), CAD, DM, HTN, Atherosclerosis    Limitations  Lt homonymous hemianopsia - no driving    Special Tests  Pt goes by "[name]"    Patient Stated Goals  reading, return  to work and yardwork    Currently in Pain?  No/denies       Practiced donning/doffing jacket w/ cues to use hood to orient jacket vertically - pt did best placing Lt arm in first. Pt then practiced donning/doffing pull over shirt - pt did different ways but would get twisted or mix arm hole w/ head opening. Recommended to lay shirt out w/ tag side out, gather up shirt and place over head first then arms. Also recommended consistency with this and making it routine. Also practiced underneath shirt (tank top) - pt did well in clinic with this but has difficulty at home w/ older tanks d/t stretching. Recommended using new tanks and always id head opening first.  Practiced donning/doffing shoes w/ visual cues to look and id Rt and Lt shoe  before donning as pt often gets mixed up. Pt then practiced tying shoes w/o difficulty today - this fluctuates some at home.                       OT Short Term Goals - 08/29/19 1222      OT SHORT TERM GOAL #1   Title  Pt to verbalize understanding with visual compensatory strategies to improve daily function including reading and environmental scanning    Time  4    Period  Weeks    Status  On-going      OT SHORT TERM GOAL #2   Title  Pt to perform tabletop scanning with 90% or greater accuracy using strategies    Time  4    Period  Weeks    Status  New      OT SHORT TERM GOAL #3   Title  Pt will read 1/2 page of text with accuracy, visual aids/AE and extra time PRN    Time  4    Period  Weeks    Status  New      OT SHORT TERM GOAL #4   Title  Pt to don shirt, don shoes and tie shoes I'ly    Time  4    Period  Weeks    Status  New        OT Long Term Goals - 08/29/19 1223      OT LONG TERM GOAL #1   Title  Pt will perform environmental scanning at 90% accuracy in mod distracting environment    Time  8    Period  Weeks    Status  New      OT LONG TERM GOAL #2   Title  Pt to read full page of text or greater with visual aids/AE prn    Time  8    Period  Weeks    Status  New      OT LONG TERM GOAL #3   Title  Pt to return to work simulated tasks w/ 75% accuracy or greater (including reading item #'s correctly and scanning for items in store)    Time  8    Period  Weeks    Status  New      OT LONG TERM GOAL #4   Title  Pt to assemble simple items (puzzles, simple PVC pipe design, home items) with mod cueing or less    Time  8    Period  Weeks    Status  New            Plan - 08/31/19 1735    Clinical Impression  Statement  Pt appears better today with balance. Pt able to demo donning/doffing shirt with cues    Occupational performance deficits (Please refer to evaluation for details):  IADL's;Work;Leisure;Social Participation;ADL's     Body Structure / Function / Physical Skills  IADL;Endurance;Balance;Vision    Cognitive Skills  Memory;Problem Solve;Perception;Thought    Rehab Potential  Good    Comorbidities impacting occupational performance description:  glioblastoma - pt has begun radiation    OT Frequency  2x / week    OT Duration  8 weeks    OT Treatment/Interventions  Self-care/ADL training;Functional Mobility Training;Therapeutic activities;Coping strategies training;Visual/perceptual remediation/compensation;Patient/family education;Cognitive remediation/compensation    Plan  continue visual scanning and visual/perceptual tasks    Consulted and Agree with Plan of Care  Patient;Family member/caregiver    Family Member Consulted  wife       Patient will benefit from skilled therapeutic intervention in order to improve the following deficits and impairments:   Body Structure / Function / Physical Skills: IADL, Endurance, Balance, Vision Cognitive Skills: Memory, Problem Solve, Perception, Thought     Visit Diagnosis: Homonymous hemianopsia, left  Visuospatial deficit  Attention and concentration deficit    Problem List Patient Active Problem List   Diagnosis Date Noted  . Goals of care, counseling/discussion 07/13/2019  . S/P craniotomy 07/04/2019  . Glioblastoma with isocitrate dehydrogenase gene wildtype ([place]) 07/04/2019  . Respiratory failure ([place])   . Encephalopathy acute   . Brain mass 07/01/2019  . Fever 07/01/2019  . Atherosclerosis of native artery of both lower extremities with intermittent claudication ([place]) 08/23/2018  . Coronary artery disease without angina pectoris 08/23/2018  . Dyspnea on exertion 08/23/2018  . Biochemically recurrent malignant neoplasm of prostate 08/16/2018  . S/P carotid endarterectomy 08/20/2015  . Essential hypertension 08/20/2015  . Type 2 diabetes mellitus with circulatory disorder ([place]) 08/20/2015  . HLD (hyperlipidemia) 08/20/2015  . Carotid artery  stenosis, symptomatic 07/03/2015  . Carotid stenosis, right   . TIA (transient ischemic attack) 06/18/2015  . ASCVD (arteriosclerotic cardiovascular disease) 06/18/2015  . Diabetes mellitus without complication ([place]) [id number]  . Resistant hypertension 06/18/2015  . Hyperlipidemia 06/18/2015  . Spinal stenosis of lumbar region with neurogenic claudication 09/06/2014  . Atherosclerosis of native artery of extremity with intermittent claudication ([place]) 03/09/2013  . HYPERLIPIDEMIA 04/08/2009  . TOBACCO ABUSE 04/08/2009  . ATHEROSCLEROTIC CARDIOVASCULAR DISEASE 04/08/2009  . INTERMITTENT VERTIGO 04/08/2009  . ADENOCARCINOMA, PROSTATE 03/21/2009  . ABDOMINAL PAIN -GENERALIZED 03/21/2009  . History of coronary artery bypass graft 06/28/2008    Carey Bullocks, OTR/L 08/31/2019, 5:37 PM  [facility] [address] [place], [place], [address] Phone: [phone]   Fax:  [phone]  Name: CARON THAN MRN: [id number] Date of Birth: May 04, 1945

## 2019-08-31 NOTE — Progress Notes (Signed)
[facility] [place], [place] [address] [phone]   Interval Evaluation  Date of Service: 08/31/19 Patient Name: [name] Patient MRN: [id number] Patient DOB: Feb 08, 1946 Provider: Ventura Sellers, MD  Identifying Statement:  [name] is a 74 y.o. male with right occipital glioblastoma    Referring Provider: Maurice Small, MD [address],  [place] [address]  Oncologic History: Oncology History  Glioblastoma with isocitrate dehydrogenase gene wildtype [facility])  07/04/2019 Surgery   Debulking resection by Dr. Christella Noa; path demonstrates Glioblastoma   08/17/2019 -  Radiation Therapy   IMRT with concurrent daily Temozolomide 57m/m2     Biomarkers:  MGMT Unmethylated.  IDH 1/2 Wild type.  EGFR Not expressed  TERT Unknown   Interval History:  HJOSHAWN CRISSMANpresents to clinic today, now having completed first two weeks of radiation and chemo.  He did have an ED visit earlier this week for blood in the urine.  XLouanna Rawhas been on hold since that time.  Headaches and audible clicks have improved. In addition, he complains of somewhat worsening of gait imbalance and short term memory.  He has been walking primarily with a cane assist. Overall his left sided vision is stable from prior.    H+P (07/13/19) Patient presented in early March 2021 with several days of progressive left sided visual impairment.  He noticed "missing things" in the left side of his field in his work and while driving.  He then developed headaches which were new for him.  Never complained of difficulty walking, speaking, using arms or hands.  CNS imaging demonstrated tumor within right occipital lobe, which was biopsied by Dr. CChristella Noaon 07/04/19.  Following surgery, he has no new complaints or progressive deficits.  He may be taking decadron 482mtwice per day.  Otherwise his functional status is only limited by visual  impairment. Worked at [employer] lives with his wife of 5041ears.  Medications: Current Outpatient Medications on File Prior to Visit  Medication Sig Dispense Refill  . amLODipine (NORVASC) 10 MG tablet TAKE 1 TABLET BY MOUTH DAILY 90 tablet 0  . atorvastatin (LIPITOR) 80 MG tablet TAKE 1 TABLET BY MOUTH DAILY AT 6 PM (Patient taking differently: TAKE 1 TABLET BY MOUTH DAILY AT 6 PM) 90 tablet 1  . baclofen (LIORESAL) 20 MG tablet Take 20 mg by mouth 2 (two) times daily.     . Marland KitchenevETIRAcetam (KEPPRA) 500 MG tablet Take 1 tablet (500 mg total) by mouth 2 (two) times daily. (Patient not taking: Reported on 08/03/2019) 60 tablet 0  . LORazepam (ATIVAN) 0.5 MG tablet 1 tab po q 4-6 hours prn or 1 tab po 30 minutes prior to MRI or radiation 30 tablet 0  . losartan-hydrochlorothiazide (HYZAAR) 100-12.5 MG tablet TAKE 1 TABLET BY MOUTH EVERY DAY 90 tablet 0  . Magnesium Hydroxide (DULCOLAX PO) Take 1 tablet by mouth daily as needed (constipation).    . metoprolol succinate (TOPROL-XL) 25 MG 24 hr tablet TAKE 1 TABLET BY MOUTH DAILY (Patient taking differently: Take 25 mg by mouth daily. ) 90 tablet 1  . rivaroxaban (XARELTO) 20 MG TABS tablet Take 1 tablet (20 mg total) by mouth daily with supper. 30 tablet 3  . sulfamethoxazole-trimethoprim (BACTRIM DS) 800-160 MG tablet Take 1 tablet by mouth 2 (two) times daily for 7 days. 14 tablet 0  . temozolomide (TEMODAR) 140 MG capsule Take 1  capsule (140 mg total) by mouth daily. May take on an empty stomach to decrease nausea & vomiting. 42 capsule 0   No current facility-administered medications on file prior to visit.    Allergies:  Allergies  Allergen Reactions  . Lisinopril Cough   Past Medical History:  Past Medical History:  Diagnosis Date  . Anxiety   . Arthritis   . ASCVD (arteriosclerotic cardiovascular disease)    03/2004: DES x2 to the RCA; IMI with stent occlusion in 02/2005- suboptimal Plavix compliance  . CAD (coronary artery  disease)   . Carcinoma of prostate ([place])    Clopidogrel held for biopsy  . Constipation due to pain medication   . Diabetes mellitus without complication ([place])   . Diabetic neuropathy ([place])   . Headache 2021  . Hyperlipidemia   . Hypertension   . Mild depression ([place])   . Morbid obesity ([place])   . Myocardial infarction ([place])   . Shortness of breath dyspnea   . Sleep apnea    no longer uses cpap  . Stroke ([place]) 2017  . Tobacco abuse    stopped smoking 06/28/09   Past Surgical History:  Past Surgical History:  Procedure Laterality Date  . APPLICATION OF CRANIAL NAVIGATION N/A 07/04/2019   Procedure: APPLICATION OF CRANIAL NAVIGATION;  Surgeon: Ashok Pall, MD;  Location: [facility];  Service: Neurosurgery;  Laterality: N/A;  . BACK SURGERY    . CARDIAC CATHETERIZATION     X 1 stent before having CABG  . CORONARY ARTERY BYPASS GRAFT  06/28/2008   X4  . CRANIOTOMY Right 07/04/2019   Procedure: Right occipital craniotomy for tumor with brainlab;  Surgeon: Ashok Pall, MD;  Location: [facility];  Service: Neurosurgery;  Laterality: Right;  . ENDARTERECTOMY Right 07/03/2015   Procedure: RIGHT CAROTID ENDARTERECTOMY WITH BOVINE PERICARDIUM PATCH ANGIOPLASTY;  Surgeon: Serafina Mitchell, MD;  Location: [facility];  Service: Vascular;  Laterality: Right;  . LUMBAR LAMINECTOMY/DECOMPRESSION MICRODISCECTOMY N/A 09/06/2014   Procedure: LUMBER DECOMPRESSION L3-5;  Surgeon: Melina Schools, MD;  Location: [facility];  Service: Orthopedics;  Laterality: N/A;  . LUMBAR SPINE SURGERY  2002  . PROSTATE BIOPSY    . PROSTATECTOMY  02/2007  . VASECTOMY     Social History:  Social History   Socioeconomic History  . Marital status: Married    Spouse name: Not on file  . Number of children: 4  . Years of education: Not on file  . Highest education level: Not on file  Occupational History  . Occupation: Technical brewer    Comment: retired  . Occupation: Designer, industrial/product: [employer]    Comment:  full time  Tobacco Use  . Smoking status: Former Smoker    Packs/day: 1.00    Years: 40.00    Pack years: 40.00    Types: Cigarettes    Quit date: 06/28/2008    Years since quitting: 11.1  . Smokeless tobacco: Former Systems developer    Quit date: 06/28/2009  Substance and Sexual Activity  . Alcohol use: Yes    Alcohol/week: 3.0 standard drinks    Types: 2 Glasses of wine, 1 Cans of beer per week    Comment: may have 1 drink a week  . Drug use: No  . Sexual activity: Not on file  Other Topics Concern  . Not on file  Social History Narrative   Married with 3 sons and 1 daughter   Daily caffeine use, 3 per day  Social Determinants of Health   Financial Resource Strain:   . Difficulty of Paying Living Expenses:   Food Insecurity:   . Worried About Charity fundraiser in the Last Year:   . Arboriculturist in the Last Year:   Transportation Needs:   . Film/video editor (Medical):   Marland Kitchen Lack of Transportation (Non-Medical):   Physical Activity:   . Days of Exercise per Week:   . Minutes of Exercise per Session:   Stress:   . Feeling of Stress :   Social Connections:   . Frequency of Communication with Friends and Family:   . Frequency of Social Gatherings with Friends and Family:   . Attends Religious Services:   . Active Member of Clubs or Organizations:   . Attends Archivist Meetings:   Marland Kitchen Marital Status:   Intimate Partner Violence:   . Fear of Current or Ex-Partner:   . Emotionally Abused:   Marland Kitchen Physically Abused:   . Sexually Abused:    Family History:  Family History  Problem Relation Age of Onset  . Kidney disease Mother   . Stroke Father   . Breast cancer Sister        breast progressed into bone  . Colon cancer Neg Hx   . Prostate cancer Neg Hx   . Pancreatic cancer Neg Hx     Review of Systems: Constitutional: Doesn't report fevers, chills or abnormal weight loss Eyes: Doesn't report blurriness of vision Ears, nose, mouth, throat, and face: Doesn't  report sore throat Respiratory: Doesn't report cough, dyspnea or wheezes Cardiovascular: Doesn't report palpitation, chest discomfort  Gastrointestinal:  Doesn't report nausea, constipation, diarrhea GU: Doesn't report incontinence Skin: Doesn't report skin rashes Neurological: Per HPI Musculoskeletal: Doesn't report joint pain Behavioral/Psych: Doesn't report anxiety  Physical Exam: Vitals:   08/31/19 0939  BP: 134/70  Pulse: 73  Resp: 18  Temp: 98.7 F (37.1 C)  SpO2: 99%   KPS: 80. General: Alert, cooperative, pleasant, in no acute distress Head: Normal EENT: No conjunctival injection or scleral icterus.  Lungs: Resp effort normal Cardiac: Regular rate Abdomen: Non-distended abdomen Skin: No rashes cyanosis or petechiae. Extremities: No clubbing or edema  Neurologic Exam: Mental Status: Awake, alert, attentive to examiner. Oriented to self and environment. Language is fluent with intact comprehension.  Cranial Nerves: Visual acuity is grossly normal. Left hemianopia. Extra-ocular movements intact. No ptosis. Face is symmetric Motor: Tone and bulk are normal. Power is full in both arms and legs. Reflexes are symmetric, no pathologic reflexes present.  Sensory: Intact to light touch Gait: Dystaxic  Labs: I have reviewed the data as listed    Component Value Date/Time   NA 133 (L) 07/05/2019 0340   NA 139 05/04/2019 0954   K 5.0 07/05/2019 0340   CL 97 (L) 07/05/2019 0340   CO2 24 07/05/2019 0340   GLUCOSE 162 (H) 07/05/2019 0340   BUN 26 (H) 07/05/2019 0340   BUN 11 05/04/2019 0954   CREATININE 0.98 07/05/2019 0340   CALCIUM 8.3 (L) 07/05/2019 0340   PROT 7.1 07/01/2019 1801   PROT 6.8 05/04/2019 0954   ALBUMIN 4.1 07/01/2019 1801   ALBUMIN 4.5 05/04/2019 0954   AST 16 07/01/2019 1801   ALT 13 07/01/2019 1801   ALKPHOS 61 07/01/2019 1801   BILITOT 1.1 07/01/2019 1801   BILITOT 0.7 05/04/2019 0954   GFRNONAA >60 07/05/2019 0340   GFRAA >60 07/05/2019 0340     Lab Results  Component Value Date   WBC 4.5 08/31/2019   NEUTROABS 3.3 08/31/2019   HGB 11.1 (L) 08/31/2019   HCT 34.0 (L) 08/31/2019   MCV 87.6 08/31/2019   PLT 176 08/31/2019     Assessment/Plan Glioblastoma with isocitrate dehydrogenase gene wildtype ([place]) [C71.9]    ARICK MARENO is clinically stable today, now having completed 2 weeks of IMRT and concurrent Temodar.    We recommended continuing with course of intensity modulated radiation therapy and concurrent daily Temozolomide.  Radiation will be administered Mon-Fri over 6 weeks, Temodar will be dosed at 45m/m2 to be given daily over 42 days.    Chemotherapy should be held for the following:  ANC less than 1,000  Platelets less than 100,000  LFT or creatinine greater than 2x ULN  If clinical concerns/contraindications develop  Due to worsening symptoms, recommended initiating Decadron 277mdaily until next visit.   Xarelto may be resumed once hematuria has cleared for 7 days.  He will return to clinic during weeks 4 and 6 of IMRT with labs for evaluation, or sooner/more frequent if needed.  All questions were answered. The patient knows to call the clinic with any problems, questions or concerns. No barriers to learning were detected.  The total time spent in the encounter was 30 minutes and more than 50% was on counseling and review of test results   ZaVentura SellersMD Medical Director of Neuro-Oncology [facility] [place]5/13/21 9:27 AM

## 2019-09-01 ENCOUNTER — Ambulatory Visit
Admission: RE | Admit: 2019-09-01 | Discharge: 2019-09-01 | Disposition: A | Discharge status: Home / Self Care | Payer: Medicare Other | Source: Ambulatory Visit | Attending: Radiation Oncology | Admitting: Radiation Oncology

## 2019-09-01 ENCOUNTER — Other Ambulatory Visit: Payer: Self-pay

## 2019-09-01 DIAGNOSIS — Z510 Encounter for antineoplastic radiation therapy: Secondary | ICD-10-CM | POA: Diagnosis not present

## 2019-09-01 DIAGNOSIS — C714 Malignant neoplasm of occipital lobe: Secondary | ICD-10-CM | POA: Diagnosis not present

## 2019-09-04 ENCOUNTER — Ambulatory Visit: Payer: Medicare Other

## 2019-09-04 ENCOUNTER — Ambulatory Visit
Admission: RE | Admit: 2019-09-04 | Discharge: 2019-09-04 | Disposition: A | Discharge status: Home / Self Care | Payer: Medicare Other | Source: Ambulatory Visit | Attending: Radiation Oncology | Admitting: Radiation Oncology

## 2019-09-04 ENCOUNTER — Other Ambulatory Visit: Payer: Self-pay

## 2019-09-04 DIAGNOSIS — H53462 Homonymous bilateral field defects, left side: Secondary | ICD-10-CM | POA: Diagnosis not present

## 2019-09-04 DIAGNOSIS — C714 Malignant neoplasm of occipital lobe: Secondary | ICD-10-CM | POA: Diagnosis not present

## 2019-09-04 DIAGNOSIS — R42 Dizziness and giddiness: Secondary | ICD-10-CM

## 2019-09-04 DIAGNOSIS — R2681 Unsteadiness on feet: Secondary | ICD-10-CM | POA: Diagnosis not present

## 2019-09-04 DIAGNOSIS — R41842 Visuospatial deficit: Secondary | ICD-10-CM | POA: Diagnosis not present

## 2019-09-04 DIAGNOSIS — R2689 Other abnormalities of gait and mobility: Secondary | ICD-10-CM | POA: Diagnosis not present

## 2019-09-04 DIAGNOSIS — Z510 Encounter for antineoplastic radiation therapy: Secondary | ICD-10-CM | POA: Diagnosis not present

## 2019-09-04 DIAGNOSIS — R41840 Attention and concentration deficit: Secondary | ICD-10-CM | POA: Diagnosis not present

## 2019-09-04 NOTE — Therapy (Deleted)
[facility] [address] [place], [place], [address] Phone: [phone]   Fax:  [phone]  Physical Therapy Evaluation  Patient Details  Name: [name] MRN: [id number] Date of Birth: Apr 07, 1946 Referring Provider (PT): [name]   Encounter Date: 09/04/2019  PT End of Session - 09/04/19 1917    Visit Number  1    Number of Visits  13    Date for PT Re-Evaluation  10/16/19    PT Start Time  [id number]    PT Stop Time  1615    PT Time Calculation (min)  45 min    Equipment Utilized During Treatment  Gait belt    Activity Tolerance  Patient tolerated treatment well    Behavior During Therapy  [facility] for tasks assessed/performed       Past Medical History:  Diagnosis Date  . Anxiety   . Arthritis   . ASCVD (arteriosclerotic cardiovascular disease)    03/2004: DES x2 to the RCA; IMI with stent occlusion in 02/2005- suboptimal Plavix compliance  . CAD (coronary artery disease)   . Carcinoma of prostate ([place])    Clopidogrel held for biopsy  . Constipation due to pain medication   . Diabetes mellitus without complication ([place])   . Diabetic neuropathy ([place])   . Headache 2021  . Hyperlipidemia   . Hypertension   . Mild depression ([place])   . Morbid obesity ([place])   . Myocardial infarction ([place])   . Shortness of breath dyspnea   . Sleep apnea    no longer uses cpap  . Stroke ([place]) 2017  . Tobacco abuse    stopped smoking 06/28/09    Past Surgical History:  Procedure Laterality Date  . APPLICATION OF CRANIAL NAVIGATION N/A 07/04/2019   Procedure: APPLICATION OF CRANIAL NAVIGATION;  Surgeon: Ashok Pall, MD;  Location: [facility];  Service: Neurosurgery;  Laterality: N/A;  . BACK SURGERY    . CARDIAC CATHETERIZATION     X 1 stent before having CABG  . CORONARY ARTERY BYPASS GRAFT  06/28/2008   X4  . CRANIOTOMY Right 07/04/2019   Procedure: Right occipital craniotomy for tumor with brainlab;  Surgeon: Ashok Pall, MD;  Location: [facility];  Service: Neurosurgery;  Laterality: Right;  . ENDARTERECTOMY Right 07/03/2015   Procedure: RIGHT CAROTID ENDARTERECTOMY WITH BOVINE PERICARDIUM PATCH ANGIOPLASTY;  Surgeon: Serafina Mitchell, MD;  Location: [facility];  Service: Vascular;  Laterality: Right;  . LUMBAR LAMINECTOMY/DECOMPRESSION MICRODISCECTOMY N/A 09/06/2014   Procedure: LUMBER DECOMPRESSION L3-5;  Surgeon: Melina Schools, MD;  Location: [facility];  Service: Orthopedics;  Laterality: N/A;  . LUMBAR SPINE SURGERY  2002  . PROSTATE BIOPSY    . PROSTATECTOMY  02/2007  . VASECTOMY      There were no vitals filed for this visit.   Subjective Assessment - 09/04/19 1554    Subjective  Pt has history of glioblastoma is S/p Craniotomy on 07/04/19. Pt has completed 2 weeks of radiation and chemo. He reports worsening of his gait and balance since the surgery. He is walking with cane for ambulation. Pt reports increasing fatigue with walking lately.    Pertinent History  PMH: Rt occipital craniotomy 07/04/19 secondary to glioblastoma. PMH: Prostate CA, CVA 2017 (no residual deficits), CAD, DM, HTN, hx of 2 lumbar surgeries 2002, 2016 (laminectomies)    Limitations  Walking    How long can you sit comfortably?  no issues    How long can you stand comfortably?  5 min    How long can you walk comfortably?  5 min    Patient Stated Goals  walk better    Currently in Pain?  No/denies         [facility] PT Assessment - 09/04/19 1607      Assessment   Medical Diagnosis  S/P craniotomy secondary to glioblastoma    Referring Provider (PT)  [name]    Onset Date/Surgical Date  07/04/19    Hand Dominance  Right      Precautions   Precaution Comments  no driving      Restrictions   Weight Bearing Restrictions  No      [place] residence    Living Arrangements  Spouse/significant other    Available Help at Discharge  Family;Friend(s)    Type of [place] to  enter    Entrance Stairs-Number of Steps  3    Entrance Stairs-Rails  None    Home Layout  Two level    Alternate Level Stairs-Number of Steps  rail on both sides    [place] - single point      Prior Function   Level of Independence  Requires assistive device for independence;Needs assistance with ADLs;Needs assistance with homemaking;Needs assistance with gait      Cognition   Overall Cognitive Status  Impaired/Different from baseline    Area of Impairment  Safety/judgement;Problem solving;Memory    Memory  Decreased short-term memory    Safety/Judgement  Decreased awareness of safety                  Objective measurements completed on examination: See above findings.                             Patient will benefit from skilled therapeutic intervention in order to improve the following deficits and impairments:     Visit Diagnosis: Dizziness and giddiness     Problem List Patient Active Problem List   Diagnosis Date Noted  . Goals of care, counseling/discussion 07/13/2019  . S/P craniotomy 07/04/2019  . Glioblastoma with isocitrate dehydrogenase gene wildtype ([place]) 07/04/2019  . Respiratory failure ([place])   . Encephalopathy acute   . Brain mass 07/01/2019  . Fever 07/01/2019  . 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 Coronary artery disease without angina pectoris 08/23/2018  . Dyspnea on exertion 08/23/2018  . Biochemically recurrent malignant neoplasm of prostate 08/16/2018  . S/P carotid endarterectomy 08/20/2015  . Essential hypertension 08/20/2015  . Type 2 diabetes mellitus with circulatory disorder ([place]) 08/20/2015  . HLD (hyperlipidemia) 08/20/2015  . Carotid artery stenosis, symptomatic 07/03/2015  . Carotid stenosis, right   . TIA (transient ischemic attack) 06/18/2015  . ASCVD (arteriosclerotic cardiovascular disease) 06/18/2015  .  Diabetes mellitus without complication ([place]) [id number]  . Resistant hypertension 06/18/2015  . Hyperlipidemia 06/18/2015  . Spinal stenosis of lumbar region with neurogenic claudication 09/06/2014  . Atherosclerosis of native artery of extremity with intermittent claudication ([place]) 03/09/2013  . HYPERLIPIDEMIA 04/08/2009  . TOBACCO ABUSE 04/08/2009  . ATHEROSCLEROTIC CARDIOVASCULAR DISEASE 04/08/2009  . INTERMITTENT VERTIGO 04/08/2009  . ADENOCARCINOMA, PROSTATE 03/21/2009  . ABDOMINAL PAIN -GENERALIZED 03/21/2009  . History of coronary artery bypass graft 06/28/2008    Kerrie Pleasure 09/04/2019, 7:19 PM  [facility] [address] [place], [place], [address] Phone: [phone]   Fax:  [phone]  Name: [name] MRN: [id number] Date of Birth: [birth year]

## 2019-09-05 ENCOUNTER — Ambulatory Visit: Payer: Medicare Other | Admitting: Occupational Therapy

## 2019-09-05 ENCOUNTER — Other Ambulatory Visit: Payer: Self-pay

## 2019-09-05 ENCOUNTER — Ambulatory Visit
Admission: RE | Admit: 2019-09-05 | Discharge: 2019-09-05 | Disposition: A | Discharge status: Home / Self Care | Payer: Medicare Other | Source: Ambulatory Visit | Attending: Radiation Oncology | Admitting: Radiation Oncology

## 2019-09-05 DIAGNOSIS — R2689 Other abnormalities of gait and mobility: Secondary | ICD-10-CM | POA: Diagnosis not present

## 2019-09-05 DIAGNOSIS — H53462 Homonymous bilateral field defects, left side: Secondary | ICD-10-CM | POA: Diagnosis not present

## 2019-09-05 DIAGNOSIS — C714 Malignant neoplasm of occipital lobe: Secondary | ICD-10-CM | POA: Diagnosis not present

## 2019-09-05 DIAGNOSIS — Z510 Encounter for antineoplastic radiation therapy: Secondary | ICD-10-CM | POA: Diagnosis not present

## 2019-09-05 DIAGNOSIS — R41840 Attention and concentration deficit: Secondary | ICD-10-CM | POA: Diagnosis not present

## 2019-09-05 DIAGNOSIS — R41842 Visuospatial deficit: Secondary | ICD-10-CM | POA: Diagnosis not present

## 2019-09-05 DIAGNOSIS — R2681 Unsteadiness on feet: Secondary | ICD-10-CM | POA: Diagnosis not present

## 2019-09-05 DIAGNOSIS — R42 Dizziness and giddiness: Secondary | ICD-10-CM | POA: Diagnosis not present

## 2019-09-05 NOTE — Therapy (Signed)
[facility] [address] [place], [place], [address] Phone: [phone]   Fax:  [phone]  Occupational Therapy Treatment  Patient Details  Name: [name] MRN: [id number] Date of Birth: 1945/08/24 Referring Provider (OT): Dr. Christella Noa   Encounter Date: 09/05/2019  OT End of Session - 09/05/19 1322    Visit Number  7    Number of Visits  17    Date for OT Re-Evaluation  10/18/19    Authorization Type  MCR primary, AARP secondary    Authorization - Visit Number  8    Authorization - Number of Visits  10    Progress Note Due on Visit  10    OT Start Time  1100    OT Stop Time  1145    OT Time Calculation (min)  45 min    Activity Tolerance  Patient tolerated treatment well    Behavior During Therapy  [facility] for tasks assessed/performed       Past Medical History:  Diagnosis Date  . Anxiety   . Arthritis   . ASCVD (arteriosclerotic cardiovascular disease)    03/2004: DES x2 to the RCA; IMI with stent occlusion in 02/2005- suboptimal Plavix compliance  . CAD (coronary artery disease)   . Carcinoma of prostate ([place])    Clopidogrel held for biopsy  . Constipation due to pain medication   . Diabetes mellitus without complication ([place])   . Diabetic neuropathy ([place])   . Headache 2021  . Hyperlipidemia   . Hypertension   . Mild depression ([place])   . Morbid obesity ([place])   . Myocardial infarction ([place])   . Shortness of breath dyspnea   . Sleep apnea    no longer uses cpap  . Stroke ([place]) 2017  . Tobacco abuse    stopped smoking 06/28/09    Past Surgical History:  Procedure Laterality Date  . APPLICATION OF CRANIAL NAVIGATION N/A 07/04/2019   Procedure: APPLICATION OF CRANIAL NAVIGATION;  Surgeon: Ashok Pall, MD;  Location: [place] [facility];  Service: Neurosurgery;  Laterality: N/A;  . BACK SURGERY    . CARDIAC CATHETERIZATION     X 1 stent before having CABG  . CORONARY ARTERY BYPASS GRAFT  06/28/2008   X4  .  CRANIOTOMY Right 07/04/2019   Procedure: Right occipital craniotomy for tumor with brainlab;  Surgeon: Ashok Pall, MD;  Location: [facility];  Service: Neurosurgery;  Laterality: Right;  . ENDARTERECTOMY Right 07/03/2015   Procedure: RIGHT CAROTID ENDARTERECTOMY WITH BOVINE PERICARDIUM PATCH ANGIOPLASTY;  Surgeon: Serafina Mitchell, MD;  Location: [facility];  Service: Vascular;  Laterality: Right;  . LUMBAR LAMINECTOMY/DECOMPRESSION MICRODISCECTOMY N/A 09/06/2014   Procedure: LUMBER DECOMPRESSION L3-5;  Surgeon: Melina Schools, MD;  Location: [facility];  Service: Orthopedics;  Laterality: N/A;  . LUMBAR SPINE SURGERY  2002  . PROSTATE BIOPSY    . PROSTATECTOMY  02/2007  . VASECTOMY      There were no vitals filed for this visit.  Subjective Assessment - 09/05/19 1107    Subjective   I put on my shirt today; I just took my time and it worked better    Patient is accompanied by:  Family member   wife   Pertinent History  Rt occipital craniotomy 07/04/19 secondary to glioblastoma. PMH: Prostate CA, CVA 2017 (no residual deficits), CAD, DM, HTN, Atherosclerosis    Limitations  Lt homonymous hemianopsia - no driving    Special Tests  Pt goes by "[employer]  Patient Stated Goals  reading, return to work and yardwork    Currently in Pain?  No/denies       Pt reports he has done better with donning shirt the past 2 days if he takes his time and id head hole or tag first.  Pt copying peg design with small pegs for visual/perceptual skills, scanning to Lt, attention to Lt side of body/hand, and coordination - pt required max cueing to scan to Lt side, pay attention to Lt hand as he would often drop peg or get the wrong color, follow design correctly, develop strategies, and to find place. Pt demo decreased visual scanning to Lt, poor attention to Lt side, decreased visual memory and visual processing, as well as decreased problem solving.                       OT Short Term Goals - 08/29/19  1222      OT SHORT TERM GOAL #1   Title  Pt to verbalize understanding with visual compensatory strategies to improve daily function including reading and environmental scanning    Time  4    Period  Weeks    Status  On-going      OT SHORT TERM GOAL #2   Title  Pt to perform tabletop scanning with 90% or greater accuracy using strategies    Time  4    Period  Weeks    Status  New      OT SHORT TERM GOAL #3   Title  Pt will read 1/2 page of text with accuracy, visual aids/AE and extra time PRN    Time  4    Period  Weeks    Status  New      OT SHORT TERM GOAL #4   Title  Pt to don shirt, don shoes and tie shoes I'ly    Time  4    Period  Weeks    Status  New        OT Long Term Goals - 08/29/19 1223      OT LONG TERM GOAL #1   Title  Pt will perform environmental scanning at 90% accuracy in mod distracting environment    Time  8    Period  Weeks    Status  New      OT LONG TERM GOAL #2   Title  Pt to read full page of text or greater with visual aids/AE prn    Time  8    Period  Weeks    Status  New      OT LONG TERM GOAL #3   Title  Pt to return to work simulated tasks w/ 75% accuracy or greater (including reading item #'s correctly and scanning for items in store)    Time  8    Period  Weeks    Status  New      OT LONG TERM GOAL #4   Title  Pt to assemble simple items (puzzles, simple PVC pipe design, home items) with mod cueing or less    Time  8    Period  Weeks    Status  New            Plan - 09/05/19 1322    Clinical Impression Statement  Pt doing better w/ donning shirt per report. Pt still presents w/ significant visual/perceptual deficits    Occupational performance deficits (Please refer to evaluation for details):  IADL's;Work;Leisure;Social  Participation;ADL's    Body Structure / Function / Physical Skills  IADL;Endurance;Balance;Vision    Cognitive Skills  Memory;Problem Solve;Perception;Thought    Rehab Potential  Good    OT Frequency   2x / week    OT Duration  8 weeks    OT Treatment/Interventions  Self-care/ADL training;Functional Mobility Training;Therapeutic activities;Coping strategies training;Visual/perceptual remediation/compensation;Patient/family education;Cognitive remediation/compensation    Plan  continue visual scanning and visual/perceptual tasks       Patient will benefit from skilled therapeutic intervention in order to improve the following deficits and impairments:   Body Structure / Function / Physical Skills: IADL, Endurance, Balance, Vision Cognitive Skills: Memory, Problem Solve, Perception, Thought     Visit Diagnosis: Visuospatial deficit  Attention and concentration deficit    Problem List Patient Active Problem List   Diagnosis Date Noted  . Goals of care, counseling/discussion 07/13/2019  . S/P craniotomy 07/04/2019  . 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 Respiratory failure ([place])   . Encephalopathy acute   . Brain mass 07/01/2019  . Fever 07/01/2019  . Atherosclerosis of native artery of both lower extremities with intermittent claudication ([place]) 08/23/2018  . Coronary artery disease without angina pectoris 08/23/2018  . Dyspnea on exertion 08/23/2018  . Biochemically recurrent malignant neoplasm of prostate 08/16/2018  . S/P carotid endarterectomy 08/20/2015  . Essential hypertension 08/20/2015  . Type 2 diabetes mellitus with circulatory disorder ([place]) 08/20/2015  . HLD (hyperlipidemia) 08/20/2015  . Carotid artery stenosis, symptomatic 07/03/2015  . Carotid stenosis, right   . TIA (transient ischemic attack) 06/18/2015  . ASCVD (arteriosclerotic cardiovascular disease) 06/18/2015  . Diabetes mellitus without complication ([place]) [id number]  . Resistant hypertension 06/18/2015  . Hyperlipidemia 06/18/2015  . Spinal stenosis of lumbar region with neurogenic claudication 09/06/2014  . Atherosclerosis of native artery of extremity with  intermittent claudication ([place]) 03/09/2013  . HYPERLIPIDEMIA 04/08/2009  . TOBACCO ABUSE 04/08/2009  . ATHEROSCLEROTIC CARDIOVASCULAR DISEASE 04/08/2009  . INTERMITTENT VERTIGO 04/08/2009  . ADENOCARCINOMA, PROSTATE 03/21/2009  . ABDOMINAL PAIN -GENERALIZED 03/21/2009  . History of coronary artery bypass graft 06/28/2008    Carey Bullocks, OTR/L 09/05/2019, 1:24 PM  [facility] [address] [place], [place], [address] Phone: [phone]   Fax:  [phone]  Name: [name] MRN: [id number] Date of Birth: Jun 17, 1945

## 2019-09-05 NOTE — Addendum Note (Signed)
Addended by: Kerrie Pleasure on: 09/05/2019 08:41 AM   Modules accepted: Orders

## 2019-09-05 NOTE — Therapy (Signed)
[facility] [address] [place], [place], [address] Phone: [phone]   Fax:  [phone]  Physical Therapy Evaluation  Patient Details  Name: [name] MRN: [id number] Date of Birth: 1945-07-23 Referring Provider (PT): [name]   Encounter Date: 09/04/2019  PT End of Session - 09/04/19 1917    Visit Number  1    Number of Visits  13    Date for PT Re-Evaluation  10/16/19    Progress Note Due on Visit  10    PT Start Time  [id number]    PT Stop Time  1615    PT Time Calculation (min)  45 min    Equipment Utilized During Treatment  Gait belt    Activity Tolerance  Patient tolerated treatment well    Behavior During Therapy  [facility] for tasks assessed/performed       Past Medical History:  Diagnosis Date  . Anxiety   . Arthritis   . ASCVD (arteriosclerotic cardiovascular disease)    03/2004: DES x2 to the RCA; IMI with stent occlusion in 02/2005- suboptimal Plavix compliance  . CAD (coronary artery disease)   . Carcinoma of prostate ([place])    Clopidogrel held for biopsy  . Constipation due to pain medication   . Diabetes mellitus without complication ([place])   . Diabetic neuropathy ([place])   . Headache 2021  . Hyperlipidemia   . Hypertension   . Mild depression ([place])   . Morbid obesity ([place])   . Myocardial infarction ([place])   . Shortness of breath dyspnea   . Sleep apnea    no longer uses cpap  . Stroke ([place]) 2017  . Tobacco abuse    stopped smoking 06/28/09    Past Surgical History:  Procedure Laterality Date  . APPLICATION OF CRANIAL NAVIGATION N/A 07/04/2019   Procedure: APPLICATION OF CRANIAL NAVIGATION;  Surgeon: Ashok Pall, MD;  Location: [facility];  Service: Neurosurgery;  Laterality: N/A;  . BACK SURGERY    . CARDIAC CATHETERIZATION     X 1 stent before having CABG  . CORONARY ARTERY BYPASS GRAFT  06/28/2008   X4  . CRANIOTOMY Right 07/04/2019   Procedure: Right occipital craniotomy for tumor  with brainlab;  Surgeon: Ashok Pall, MD;  Location: [facility];  Service: Neurosurgery;  Laterality: Right;  . ENDARTERECTOMY Right 07/03/2015   Procedure: RIGHT CAROTID ENDARTERECTOMY WITH BOVINE PERICARDIUM PATCH ANGIOPLASTY;  Surgeon: Serafina Mitchell, MD;  Location: [facility];  Service: Vascular;  Laterality: Right;  . LUMBAR LAMINECTOMY/DECOMPRESSION MICRODISCECTOMY N/A 09/06/2014   Procedure: LUMBER DECOMPRESSION L3-5;  Surgeon: Melina Schools, MD;  Location: [facility];  Service: Orthopedics;  Laterality: N/A;  . LUMBAR SPINE SURGERY  2002  . PROSTATE BIOPSY    . PROSTATECTOMY  02/2007  . VASECTOMY      There were no vitals filed for this visit.   Subjective Assessment - 09/04/19 1554    Subjective  Pt has history of glioblastoma is S/p Craniotomy on 07/04/19. Pt has completed 2 weeks of radiation and chemo. He reports worsening of his gait and balance since the surgery. He is walking with cane for ambulation. Pt reports increasing fatigue with walking lately.    Pertinent History  PMH: Rt occipital craniotomy 07/04/19 secondary to glioblastoma. PMH: Prostate CA, CVA 2017 (no residual deficits), CAD, DM, HTN, hx of 2 lumbar surgeries 2002, 2016 (laminectomies)    Limitations  Walking    How long can you sit comfortably?  no issues  How long can you stand comfortably?  5 min    How long can you walk comfortably?  5 min    Patient Stated Goals  walk better    Currently in Pain?  No/denies         [facility] PT Assessment - 09/04/19 1607      Assessment   Medical Diagnosis  S/P craniotomy secondary to glioblastoma    Referring Provider (PT)  [name]    Onset Date/Surgical Date  07/04/19    Hand Dominance  Right      Precautions   Precaution Comments  no driving      Restrictions   Weight Bearing Restrictions  No      [place] residence    Living Arrangements  Spouse/significant other    Available Help at Discharge  Family;Friend(s)    Type of [place] to enter    Entrance Stairs-Number of Steps  3    Entrance Stairs-Rails  None    Home Layout  Two level    Alternate Level Stairs-Number of Steps  rail on both sides    [place] - single point      Prior Function   Level of Independence  Requires assistive device for independence;Needs assistance with ADLs;Needs assistance with homemaking;Needs assistance with gait      Cognition   Overall Cognitive Status  Impaired/Different from baseline    Area of Impairment  Safety/judgement;Problem solving;Memory    Memory  Decreased short-term memory    Safety/Judgement  Decreased awareness of safety      Standardized Balance Assessment   Standardized Balance Assessment  Five Times Sit to Stand;Berg Balance Test    Five times sit to stand comments   21      Berg Balance Test   Sit to Stand  Able to stand without using hands and stabilize independently    Standing Unsupported  Able to stand safely 2 minutes    Sitting with Back Unsupported but Feet Supported on Floor or Stool  Able to sit safely and securely 2 minutes    Stand to Sit  Sits safely with minimal use of hands    Transfers  Able to transfer safely, minor use of hands    Standing Unsupported with Eyes Closed  Able to stand 10 seconds safely    Standing Unsupported with Feet Together  Able to place feet together independently and stand 1 minute safely    From Standing, Reach Forward with Outstretched Arm  Can reach confidently >25 cm (10")    From Standing Position, Pick up Object from Floor  Able to pick up shoe, needs supervision    From Standing Position, Turn to Look Behind Over each Shoulder  Looks behind from both sides and weight shifts well    Turn 360 Degrees  Able to turn 360 degrees safely but slowly    Standing Unsupported, Alternately Place Feet on Step/Stool  Able to stand independently and safely and complete 8 steps in 20 seconds    Standing Unsupported, One Foot in Front   Needs help to step but can hold 15 seconds    Standing on One Leg  Tries to lift leg/unable to hold 3 seconds but remains standing independently    Total Score  47                  Objective measurements completed  on examination: See above findings.              PT Education - 09/05/19 0827    Education Details  Pt educated on how vision, proprioception and vestibular system works together to improve balance,    Person(s) Educated  Patient;Spouse    Methods  Explanation    Comprehension  Verbalized understanding       PT Short Term Goals - 09/05/19 0828      PT SHORT TERM GOAL #1   Title  Pt will be able to perform 5x sit to stand under 18 seconds to improve functional strengh    Baseline  21 s    Time  3    Period  Weeks    Status  New    Target Date  09/26/19      PT SHORT TERM GOAL #2   Title  Patient will be able to ambulate for 15 min without rest with use of his cane to improve walking endurnace    Baseline  5 min    Time  3    Period  Weeks    Status  New    Target Date  09/26/19        PT Long Term Goals - 09/05/19 0831      PT LONG TERM GOAL #1   Title  Pt will demo 52/56 or better on BBS to improve balance and reduce fall risk    Baseline  47/56 (eval)    Time  6    Period  Weeks    Status  New    Target Date  10/17/19      PT LONG TERM GOAL #2   Title  Patient will be able to perform sit to stand in under 16 seconds to improve overall strength    Baseline  21 s    Time  6    Period  Weeks    Status  New    Target Date  10/17/19      PT LONG TERM GOAL #3   Title  Patient will demo score of 20/24 or better on DGI to improve functional ambulation    Baseline  TBD    Time  6    Period  Weeks    Status  New    Target Date  10/17/19             Plan - 09/05/19 [id number]    Clinical Impression Statement  Patient is a 74 y.o. male who was seen today for physical therapy evaluation and treatment for gait and balance disorder  s/p craniotomy secondary to glioblastoma. Patient's objective impairments include decreased functional strength, decreased gait and mobility, reduced endurance, and decreased balance and proprioception. Patient will benefit from skilled PT to address these impairments and improve overall function.    Personal Factors and Comorbidities  Comorbidity 3+    Comorbidities  craniotomy secondary to glioblastoma, hx of lumbar surgeries x 2, CABG x 4    Examination-Activity Limitations  Squat;Stairs;Locomotion Level;Caring for Others;Carry    Examination-Participation Restrictions  Cleaning;Community Activity;Driving;Laundry;Yard Work;Shop    Stability/Clinical Decision Making  Unstable/Unpredictable    Clinical Decision Making  High    Rehab Potential  Good    PT Frequency  2x / week    PT Duration  6 weeks    PT Treatment/Interventions  ADLs/Self Care Home Management;Gait training;Stair training;Functional mobility training;Therapeutic activities;Therapeutic exercise;Balance training;Neuromuscular re-education;Vestibular;Visual/perceptual remediation/compensation    PT Next Visit Plan  Assess DGI,  review HEP, progress HEP    PT Home Exercise Plan  SLS, sit to stand without HHA    Consulted and Agree with Plan of Care  Patient       Patient will benefit from skilled therapeutic intervention in order to improve the following deficits and impairments:  Abnormal gait, Decreased activity tolerance, Decreased balance, Decreased mobility, Decreased endurance, Difficulty walking, Dizziness, Impaired vision/preception  Visit Diagnosis: Unsteadiness on feet  Dizziness and giddiness  Other abnormalities of gait and mobility     Problem List Patient Active Problem List   Diagnosis Date Noted  . Goals of care, counseling/discussion 07/13/2019  . S/P craniotomy 07/04/2019  . 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 Respiratory failure ([place])   . Encephalopathy acute    . Brain mass 07/01/2019  . Fever 07/01/2019  . Atherosclerosis of native artery of both lower extremities with intermittent claudication ([place]) 08/23/2018  . Coronary artery disease without angina pectoris 08/23/2018  . Dyspnea on exertion 08/23/2018  . Biochemically recurrent malignant neoplasm of prostate 08/16/2018  . S/P carotid endarterectomy 08/20/2015  . Essential hypertension 08/20/2015  . Type 2 diabetes mellitus with circulatory disorder ([place]) 08/20/2015  . HLD (hyperlipidemia) 08/20/2015  . Carotid artery stenosis, symptomatic 07/03/2015  . Carotid stenosis, right   . TIA (transient ischemic attack) 06/18/2015  . ASCVD (arteriosclerotic cardiovascular disease) 06/18/2015  . Diabetes mellitus without complication ([place]) [id number]  . Resistant hypertension 06/18/2015  . Hyperlipidemia 06/18/2015  . Spinal stenosis of lumbar region with neurogenic claudication 09/06/2014  . Atherosclerosis of native artery of extremity with intermittent claudication ([place]) 03/09/2013  . HYPERLIPIDEMIA 04/08/2009  . TOBACCO ABUSE 04/08/2009  . ATHEROSCLEROTIC CARDIOVASCULAR DISEASE 04/08/2009  . INTERMITTENT VERTIGO 04/08/2009  . ADENOCARCINOMA, PROSTATE 03/21/2009  . ABDOMINAL PAIN -GENERALIZED 03/21/2009  . History of coronary artery bypass graft 06/28/2008    Kerrie Pleasure, PT 09/05/2019, 8:40 AM  [facility] [address] [place], [place], [address] Phone: [phone]   Fax:  [phone]  Name: [name] MRN: [id number] Date of Birth: May 02, 1945

## 2019-09-06 ENCOUNTER — Other Ambulatory Visit: Payer: Self-pay

## 2019-09-06 ENCOUNTER — Ambulatory Visit
Admission: RE | Admit: 2019-09-06 | Discharge: 2019-09-06 | Disposition: A | Discharge status: Home / Self Care | Payer: Medicare Other | Source: Ambulatory Visit | Attending: Radiation Oncology | Admitting: Radiation Oncology

## 2019-09-06 DIAGNOSIS — Z510 Encounter for antineoplastic radiation therapy: Secondary | ICD-10-CM | POA: Diagnosis not present

## 2019-09-06 DIAGNOSIS — C714 Malignant neoplasm of occipital lobe: Secondary | ICD-10-CM | POA: Diagnosis not present

## 2019-09-07 ENCOUNTER — Ambulatory Visit
Admission: RE | Admit: 2019-09-07 | Discharge: 2019-09-07 | Disposition: A | Discharge status: Home / Self Care | Payer: Medicare Other | Source: Ambulatory Visit | Attending: Radiation Oncology | Admitting: Radiation Oncology

## 2019-09-07 ENCOUNTER — Other Ambulatory Visit: Payer: Self-pay

## 2019-09-07 ENCOUNTER — Ambulatory Visit: Payer: Medicare Other | Admitting: Occupational Therapy

## 2019-09-07 DIAGNOSIS — R41840 Attention and concentration deficit: Secondary | ICD-10-CM

## 2019-09-07 DIAGNOSIS — R41842 Visuospatial deficit: Secondary | ICD-10-CM

## 2019-09-07 DIAGNOSIS — R2681 Unsteadiness on feet: Secondary | ICD-10-CM | POA: Diagnosis not present

## 2019-09-07 DIAGNOSIS — H53462 Homonymous bilateral field defects, left side: Secondary | ICD-10-CM | POA: Diagnosis not present

## 2019-09-07 DIAGNOSIS — Z510 Encounter for antineoplastic radiation therapy: Secondary | ICD-10-CM | POA: Diagnosis not present

## 2019-09-07 DIAGNOSIS — R2689 Other abnormalities of gait and mobility: Secondary | ICD-10-CM | POA: Diagnosis not present

## 2019-09-07 DIAGNOSIS — R42 Dizziness and giddiness: Secondary | ICD-10-CM | POA: Diagnosis not present

## 2019-09-07 DIAGNOSIS — C714 Malignant neoplasm of occipital lobe: Secondary | ICD-10-CM | POA: Diagnosis not present

## 2019-09-07 NOTE — Therapy (Signed)
[facility] [address] [place], [place], [address] Phone: [phone]   Fax:  [phone]  Occupational Therapy Treatment  Patient Details  Name: [name] MRN: [id number] Date of Birth: 1945/11/25 Referring Provider (OT): Dr. Christella Noa   Encounter Date: 09/07/2019  OT End of Session - 09/07/19 1255    Visit Number  8    Number of Visits  17    Date for OT Re-Evaluation  10/18/19    Authorization Type  MCR primary, AARP secondary    Authorization - Visit Number  9    Authorization - Number of Visits  10    Progress Note Due on Visit  10    OT Start Time  1148    OT Stop Time  1233    OT Time Calculation (min)  45 min    Activity Tolerance  Patient tolerated treatment well    Behavior During Therapy  [facility] for tasks assessed/performed       Past Medical History:  Diagnosis Date  . Anxiety   . Arthritis   . ASCVD (arteriosclerotic cardiovascular disease)    03/2004: DES x2 to the RCA; IMI with stent occlusion in 02/2005- suboptimal Plavix compliance  . CAD (coronary artery disease)   . Carcinoma of prostate ([place])    Clopidogrel held for biopsy  . Constipation due to pain medication   . Diabetes mellitus without complication ([place])   . Diabetic neuropathy ([place])   . Headache 2021  . Hyperlipidemia   . Hypertension   . Mild depression ([place])   . Morbid obesity ([place])   . Myocardial infarction ([place])   . Shortness of breath dyspnea   . Sleep apnea    no longer uses cpap  . Stroke ([place]) 2017  . Tobacco abuse    stopped smoking 06/28/09    Past Surgical History:  Procedure Laterality Date  . APPLICATION OF CRANIAL NAVIGATION N/A 07/04/2019   Procedure: APPLICATION OF CRANIAL NAVIGATION;  Surgeon: Ashok Pall, MD;  Location: [facility];  Service: Neurosurgery;  Laterality: N/A;  . BACK SURGERY    . CARDIAC CATHETERIZATION     X 1 stent before having CABG  . CORONARY ARTERY BYPASS GRAFT  06/28/2008   X4  .  CRANIOTOMY Right 07/04/2019   Procedure: Right occipital craniotomy for tumor with brainlab;  Surgeon: Ashok Pall, MD;  Location: [facility];  Service: Neurosurgery;  Laterality: Right;  . ENDARTERECTOMY Right 07/03/2015   Procedure: RIGHT CAROTID ENDARTERECTOMY WITH BOVINE PERICARDIUM PATCH ANGIOPLASTY;  Surgeon: Serafina Mitchell, MD;  Location: [facility];  Service: Vascular;  Laterality: Right;  . LUMBAR LAMINECTOMY/DECOMPRESSION MICRODISCECTOMY N/A 09/06/2014   Procedure: LUMBER DECOMPRESSION L3-5;  Surgeon: Melina Schools, MD;  Location: [facility];  Service: Orthopedics;  Laterality: N/A;  . LUMBAR SPINE SURGERY  2002  . PROSTATE BIOPSY    . PROSTATECTOMY  02/2007  . VASECTOMY      There were no vitals filed for this visit.  Subjective Assessment - 09/07/19 1154    Patient is accompanied by:  Family member   wife   Pertinent History  Rt occipital craniotomy 07/04/19 secondary to glioblastoma. PMH: Prostate CA, CVA 2017 (no residual deficits), CAD, DM, HTN, Atherosclerosis    Limitations  Lt homonymous hemianopsia - no driving    Special Tests  Pt goes by "[name]"    Patient Stated Goals  reading, return to work and yardwork    Currently in Pain?  No/denies  Pt picking up blocks by color w/ gripper Lt hand for visual scanning and attention to Lt side w/ cues to scan fully Lt and clear hand of bowl (on Lt side) to prevent dragging.  Pt then matching digital times to analogue times for tabletop scanning and visual processing, with increased times and cues to id times for Lt side, especially lower Lt; visual memory, and noted pt to have more difficulty with half past times than on the hour times d/t decreased visual processing.                       OT Short Term Goals - 08/29/19 1222      OT SHORT TERM GOAL #1   Title  Pt to verbalize understanding with visual compensatory strategies to improve daily function including reading and environmental scanning    Time  4     Period  Weeks    Status  On-going      OT SHORT TERM GOAL #2   Title  Pt to perform tabletop scanning with 90% or greater accuracy using strategies    Time  4    Period  Weeks    Status  New      OT SHORT TERM GOAL #3   Title  Pt will read 1/2 page of text with accuracy, visual aids/AE and extra time PRN    Time  4    Period  Weeks    Status  New      OT SHORT TERM GOAL #4   Title  Pt to don shirt, don shoes and tie shoes I'ly    Time  4    Period  Weeks    Status  New        OT Long Term Goals - 08/29/19 1223      OT LONG TERM GOAL #1   Title  Pt will perform environmental scanning at 90% accuracy in mod distracting environment    Time  8    Period  Weeks    Status  New      OT LONG TERM GOAL #2   Title  Pt to read full page of text or greater with visual aids/AE prn    Time  8    Period  Weeks    Status  New      OT LONG TERM GOAL #3   Title  Pt to return to work simulated tasks w/ 75% accuracy or greater (including reading item #'s correctly and scanning for items in store)    Time  8    Period  Weeks    Status  New      OT LONG TERM GOAL #4   Title  Pt to assemble simple items (puzzles, simple PVC pipe design, home items) with mod cueing or less    Time  8    Period  Weeks    Status  New            Plan - 09/07/19 1256    Clinical Impression Statement  Pt making gradual progress with attention to Lt side. Pt continues to demo decreased visual processing, visual memory, and decreased scanning to Lt side    Occupational performance deficits (Please refer to evaluation for details):  IADL's;Work;Leisure;Social Participation;ADL's    Body Structure / Function / Physical Skills  IADL;Endurance;Balance;Vision    Cognitive Skills  Memory;Problem Solve;Perception;Thought    Rehab Potential  Good    OT Frequency  2x /  week    OT Duration  8 weeks    OT Treatment/Interventions  Self-care/ADL training;Functional Furniture conservator/restorer;Therapeutic activities;Coping  strategies training;Visual/perceptual remediation/compensation;Patient/family education;Cognitive remediation/compensation    Plan  10th progress note, continue visual scanning and visual/perceptual tasks    Consulted and Agree with Plan of Care  Patient;Family member/caregiver    Family Member Consulted  wife       Patient will benefit from skilled therapeutic intervention in order to improve the following deficits and impairments:   Body Structure / Function / Physical Skills: IADL, Endurance, Balance, Vision Cognitive Skills: Memory, Problem Solve, Perception, Thought     Visit Diagnosis: Visuospatial deficit  Attention and concentration deficit    Problem List Patient Active Problem List   Diagnosis Date Noted  . Goals of care, counseling/discussion 07/13/2019  . S/P craniotomy 07/04/2019  . 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 Respiratory failure ([place])   . Encephalopathy acute   . Brain mass 07/01/2019  . Fever 07/01/2019  . Atherosclerosis of native artery of both lower extremities with intermittent claudication ([place]) 08/23/2018  . Coronary artery disease without angina pectoris 08/23/2018  . Dyspnea on exertion 08/23/2018  . Biochemically recurrent malignant neoplasm of prostate 08/16/2018  . S/P carotid endarterectomy 08/20/2015  . Essential hypertension 08/20/2015  . Type 2 diabetes mellitus with circulatory disorder ([place]) 08/20/2015  . HLD (hyperlipidemia) 08/20/2015  . Carotid artery stenosis, symptomatic 07/03/2015  . Carotid stenosis, right   . TIA (transient ischemic attack) 06/18/2015  . ASCVD (arteriosclerotic cardiovascular disease) 06/18/2015  . Diabetes mellitus without complication ([place]) [id number]  . Resistant hypertension 06/18/2015  . Hyperlipidemia 06/18/2015  . Spinal stenosis of lumbar region with neurogenic claudication 09/06/2014  . Atherosclerosis of native artery of extremity with intermittent  claudication ([place]) 03/09/2013  . HYPERLIPIDEMIA 04/08/2009  . TOBACCO ABUSE 04/08/2009  . ATHEROSCLEROTIC CARDIOVASCULAR DISEASE 04/08/2009  . INTERMITTENT VERTIGO 04/08/2009  . ADENOCARCINOMA, PROSTATE 03/21/2009  . ABDOMINAL PAIN -GENERALIZED 03/21/2009  . History of coronary artery bypass graft 06/28/2008    Carey Bullocks, OTR/L 09/07/2019, 12:58 PM  [facility] [address] [place], [place], [address] Phone: [phone]   Fax:  [phone]  Name: [name] MRN: [id number] Date of Birth: Jul 06, 1945

## 2019-09-08 ENCOUNTER — Ambulatory Visit
Admission: RE | Admit: 2019-09-08 | Discharge: 2019-09-08 | Disposition: A | Discharge status: Home / Self Care | Payer: Medicare Other | Source: Ambulatory Visit | Attending: Radiation Oncology | Admitting: Radiation Oncology

## 2019-09-08 ENCOUNTER — Other Ambulatory Visit: Payer: Self-pay

## 2019-09-08 DIAGNOSIS — C714 Malignant neoplasm of occipital lobe: Secondary | ICD-10-CM | POA: Diagnosis not present

## 2019-09-08 DIAGNOSIS — Z510 Encounter for antineoplastic radiation therapy: Secondary | ICD-10-CM | POA: Diagnosis not present

## 2019-09-11 ENCOUNTER — Telehealth: Payer: Self-pay | Admitting: *Deleted

## 2019-09-11 ENCOUNTER — Other Ambulatory Visit: Payer: Self-pay

## 2019-09-11 ENCOUNTER — Ambulatory Visit
Admission: RE | Admit: 2019-09-11 | Discharge: 2019-09-11 | Disposition: A | Discharge status: Home / Self Care | Payer: Medicare Other | Source: Ambulatory Visit | Attending: Radiation Oncology | Admitting: Radiation Oncology

## 2019-09-11 DIAGNOSIS — Z510 Encounter for antineoplastic radiation therapy: Secondary | ICD-10-CM | POA: Diagnosis not present

## 2019-09-11 DIAGNOSIS — C714 Malignant neoplasm of occipital lobe: Secondary | ICD-10-CM | POA: Diagnosis not present

## 2019-09-11 MED FILL — ONDANSETRON HCL 8 MG TABLET: 8 | 15 days supply | Qty: 30 | Fill #1

## 2019-09-11 MED FILL — TEMOZOLOMIDE 140 MG CAPS: 140 | 12 days supply | Qty: 12 | Fill #1

## 2019-09-11 NOTE — Telephone Encounter (Signed)
Patients spouse called to request advice.  States patient woke this morning with a slight headache and nausea.  Went to radiation, and vomited afterwards.  Went home and rested.  Upon waking he went to the bathroom and his legs gave way and he stated he was unable to bear any weight.    Reviewed symptoms with [name].  He requested in person visit with him tomorrow immediately following Radiation.  Today he should do a second dose of Decadron 2 mg and he can also continue to use his Zofran for nausea and vomiting.  Wife expressed understanding.  Explained that if symptoms should suddenly worsen then he should go to the emergency room.

## 2019-09-12 ENCOUNTER — Ambulatory Visit
Admission: RE | Admit: 2019-09-12 | Discharge: 2019-09-12 | Disposition: A | Discharge status: Home / Self Care | Payer: Medicare Other | Source: Ambulatory Visit | Attending: Radiation Oncology | Admitting: Radiation Oncology

## 2019-09-12 ENCOUNTER — Other Ambulatory Visit: Payer: Self-pay

## 2019-09-12 ENCOUNTER — Inpatient Hospital Stay (HOSPITAL_BASED_OUTPATIENT_CLINIC_OR_DEPARTMENT_OTHER): Payer: Medicare Other | Admitting: Internal Medicine

## 2019-09-12 ENCOUNTER — Inpatient Hospital Stay: Payer: Medicare Other

## 2019-09-12 VITALS — BP 136/96 | HR 69 | Temp 98.7°F | Resp 18 | Ht 74.0 in | Wt 216.6 lb

## 2019-09-12 DIAGNOSIS — H538 Other visual disturbances: Secondary | ICD-10-CM | POA: Diagnosis not present

## 2019-09-12 DIAGNOSIS — C719 Malignant neoplasm of brain, unspecified: Secondary | ICD-10-CM

## 2019-09-12 DIAGNOSIS — Z79899 Other long term (current) drug therapy: Secondary | ICD-10-CM | POA: Diagnosis not present

## 2019-09-12 DIAGNOSIS — Z87891 Personal history of nicotine dependence: Secondary | ICD-10-CM | POA: Diagnosis not present

## 2019-09-12 DIAGNOSIS — C714 Malignant neoplasm of occipital lobe: Secondary | ICD-10-CM | POA: Diagnosis not present

## 2019-09-12 DIAGNOSIS — R319 Hematuria, unspecified: Secondary | ICD-10-CM | POA: Diagnosis not present

## 2019-09-12 DIAGNOSIS — R2689 Other abnormalities of gait and mobility: Secondary | ICD-10-CM | POA: Diagnosis not present

## 2019-09-12 DIAGNOSIS — Z510 Encounter for antineoplastic radiation therapy: Secondary | ICD-10-CM | POA: Diagnosis not present

## 2019-09-12 LAB — CMP (CANCER CENTER ONLY)
ALT: 9 U/L (ref 0–44)
AST: 10 U/L — ABNORMAL LOW (ref 15–41)
Albumin: 3.9 g/dL (ref 3.5–5.0)
Alkaline Phosphatase: 79 U/L (ref 38–126)
Anion gap: 7 (ref 5–15)
BUN: 16 mg/dL (ref 8–23)
CO2: 32 mmol/L (ref 22–32)
Calcium: 9.8 mg/dL (ref 8.9–10.3)
Chloride: 95 mmol/L — ABNORMAL LOW (ref 98–111)
Creatinine: 0.98 mg/dL (ref 0.61–1.24)
GFR, Est AFR Am: >60 mL/min (ref >60)
GFR, Estimated: >60 mL/min (ref >60)
Glucose, Bld: 168 mg/dL — ABNORMAL HIGH (ref 70–99)
Potassium: 4.2 mmol/L (ref 3.5–5.1)
Sodium: 134 mmol/L — ABNORMAL LOW (ref 135–145)
Total Bilirubin: 1.2 mg/dL (ref 0.3–1.2)
Total Protein: 7.2 g/dL (ref 6.5–8.1)

## 2019-09-12 LAB — CBC WITH DIFFERENTIAL (CANCER CENTER ONLY)
Abs Immature Granulocytes: 0.04 10*3/uL (ref 0.00–0.07)
Basophils Absolute: 0 10*3/uL (ref 0.0–0.1)
Basophils Relative: 0 %
Eosinophils Absolute: 0 10*3/uL (ref 0.0–0.5)
Eosinophils Relative: 0 %
HCT: 37.7 % — ABNORMAL LOW (ref 39.0–52.0)
Hemoglobin: 12.3 g/dL — ABNORMAL LOW (ref 13.0–17.0)
Immature Granulocytes: 1 %
Lymphocytes Relative: 6 %
Lymphs Abs: 0.4 10*3/uL — ABNORMAL LOW (ref 0.7–4.0)
MCH: 28.9 pg (ref 26.0–34.0)
MCHC: 32.6 g/dL (ref 30.0–36.0)
MCV: 88.5 fL (ref 80.0–100.0)
Monocytes Absolute: 0.7 10*3/uL (ref 0.1–1.0)
Monocytes Relative: 10 %
Neutro Abs: 5.9 10*3/uL (ref 1.7–7.7)
Neutrophils Relative %: 83 %
Platelet Count: 251 10*3/uL (ref 150–400)
RBC: 4.26 MIL/uL (ref 4.22–5.81)
RDW: 13.3 % (ref 11.5–15.5)
WBC Count: 7.1 10*3/uL (ref 4.0–10.5)
nRBC: 0 % (ref 0.0–0.2)

## 2019-09-12 MED ORDER — LEVETIRACETAM 500 MG PO TABS: 500.0000 mg | ORAL_TABLET | Freq: Two times a day (BID) | ORAL | 3 refills | Status: DC

## 2019-09-12 NOTE — Progress Notes (Signed)
[facility] [place], [place] [address] [phone]   Interval Evaluation  Date of Service: 09/12/19 Patient Name: [name] Patient MRN: [id number] Patient DOB: 1945-05-22 Provider: Ventura Sellers, MD  Identifying Statement:  [name] is a 74 y.o. male with right occipital glioblastoma    Referring Provider: Maurice Small, MD [place] [address] [place],  [place] [address]  Oncologic History: Oncology History  Glioblastoma with isocitrate dehydrogenase gene wildtype [facility])  07/04/2019 Surgery   Debulking resection by Dr. Christella Noa; path demonstrates Glioblastoma   08/17/2019 -  Radiation Therapy   IMRT with concurrent daily Temozolomide '75mg'$ /m2     Biomarkers:  MGMT Unmethylated.  IDH 1/2 Wild type.  EGFR Not expressed  TERT Unknown   Interval History:  [name] presents to clinic today, now having completed 4 weeks of radiation and chemo.  He did have an episode of confusion, nausea and vomiting yesterday.  This resolved after a dose of zofran and '4mg'$  dose of decadron.  Wife dose describe an additional episode of "2-3 left arm shaking", which occurred yesterday.  He may have had other similar episodes. [name] has been resumed without further blood in urine.  He has been walking primarily with a cane assist. Overall his left sided vision is stable from prior.    H+P (07/13/19) Patient presented in early March 2021 with several days of progressive left sided visual impairment.  He noticed "missing things" in the left side of his field in his work and while driving.  He then developed headaches which were new for him.  Never complained of difficulty walking, speaking, using arms or hands.  CNS imaging demonstrated tumor within right occipital lobe, which was biopsied by Dr. Christella Noa on 07/04/19.  Following surgery, he has no new complaints or progressive deficits.  He may be taking decadron '4mg'$   twice per day.  Otherwise his functional status is only limited by visual impairment. Worked at [employer] and lives with his wife of 51 years.  Medications: Current Outpatient Medications on File Prior to Visit  Medication Sig Dispense Refill  . amLODipine (NORVASC) 10 MG tablet TAKE 1 TABLET BY MOUTH DAILY 90 tablet 0  . atorvastatin (LIPITOR) 80 MG tablet TAKE 1 TABLET BY MOUTH DAILY AT 6 PM (Patient taking differently: TAKE 1 TABLET BY MOUTH DAILY AT 6 PM) 90 tablet 1  . dexamethasone (DECADRON) 2 MG tablet Take 1 tablet (2 mg total) by mouth daily. 30 tablet 0  . LORazepam (ATIVAN) 0.5 MG tablet 1 tab po q 4-6 hours prn or 1 tab po 30 minutes prior to MRI or radiation 30 tablet 0  . losartan-hydrochlorothiazide (HYZAAR) 100-12.5 MG tablet TAKE 1 TABLET BY MOUTH EVERY DAY 90 tablet 0  . Magnesium Hydroxide (DULCOLAX PO) Take 1 tablet by mouth daily as needed (constipation).    . metoprolol succinate (TOPROL-XL) 25 MG 24 hr tablet TAKE 1 TABLET BY MOUTH DAILY (Patient taking differently: Take 25 mg by mouth daily. ) 90 tablet 1  . rivaroxaban (XARELTO) 20 MG TABS tablet Take 1 tablet (20 mg total) by mouth daily with supper. 30 tablet 3  . temozolomide (TEMODAR) 140 MG capsule Take 1 capsule (140 mg total) by mouth daily. May take on an empty stomach to decrease nausea & vomiting. 42 capsule 0   No current facility-administered medications on file prior to visit.    Allergies:  Allergies  Allergen  Reactions  . Lisinopril Cough   Past Medical History:  Past Medical History:  Diagnosis Date  . Anxiety   . Arthritis   . ASCVD (arteriosclerotic cardiovascular disease)    03/2004: DES x2 to the RCA; IMI with stent occlusion in 02/2005- suboptimal Plavix compliance  . CAD (coronary artery disease)   . Carcinoma of prostate ([place])    Clopidogrel held for biopsy  . Constipation due to pain medication   . Diabetes mellitus without complication ([place])   . Diabetic neuropathy ([place])     . Headache 2021  . Hyperlipidemia   . Hypertension   . Mild depression ([place])   . Morbid obesity ([place])   . Myocardial infarction ([place])   . Shortness of breath dyspnea   . Sleep apnea    no longer uses cpap  . Stroke ([place]) 2017  . Tobacco abuse    stopped smoking 06/28/09   Past Surgical History:  Past Surgical History:  Procedure Laterality Date  . APPLICATION OF CRANIAL NAVIGATION N/A 07/04/2019   Procedure: APPLICATION OF CRANIAL NAVIGATION;  Surgeon: Ashok Pall, MD;  Location: [facility];  Service: Neurosurgery;  Laterality: N/A;  . BACK SURGERY    . CARDIAC CATHETERIZATION     X 1 stent before having CABG  . CORONARY ARTERY BYPASS GRAFT  06/28/2008   X4  . CRANIOTOMY Right 07/04/2019   Procedure: Right occipital craniotomy for tumor with brainlab;  Surgeon: Ashok Pall, MD;  Location: [facility];  Service: Neurosurgery;  Laterality: Right;  . ENDARTERECTOMY Right 07/03/2015   Procedure: RIGHT CAROTID ENDARTERECTOMY WITH BOVINE PERICARDIUM PATCH ANGIOPLASTY;  Surgeon: Serafina Mitchell, MD;  Location: [facility];  Service: Vascular;  Laterality: Right;  . LUMBAR LAMINECTOMY/DECOMPRESSION MICRODISCECTOMY N/A 09/06/2014   Procedure: LUMBER DECOMPRESSION L3-5;  Surgeon: Melina Schools, MD;  Location: [facility];  Service: Orthopedics;  Laterality: N/A;  . LUMBAR SPINE SURGERY  2002  . PROSTATE BIOPSY    . PROSTATECTOMY  02/2007  . VASECTOMY     Social History:  Social History   Socioeconomic History  . Marital status: Married    Spouse name: Not on file  . Number of children: 4  . Years of education: Not on file  . Highest education level: Not on file  Occupational History  . Occupation: Technical brewer    Comment: retired  . Occupation: Designer, industrial/product: [employer]    Comment: full time  Tobacco Use  . Smoking status: Former Smoker    Packs/day: 1.00    Years: 40.00    Pack years: 40.00    Types: Cigarettes    Quit date: 06/28/2008    Years since quitting: 11.2   . Smokeless tobacco: Former Systems developer    Quit date: 06/28/2009  Substance and Sexual Activity  . Alcohol use: Yes    Alcohol/week: 3.0 standard drinks    Types: 2 Glasses of wine, 1 Cans of beer per week    Comment: may have 1 drink a week  . Drug use: No  . Sexual activity: Not on file  Other Topics Concern  . Not on file  Social History Narrative   Married with 3 sons and 1 daughter   Daily caffeine use, 3 per day   Social Determinants of Health   Financial Resource Strain:   . Difficulty of Paying Living Expenses:   Food Insecurity:   . Worried About Charity fundraiser in the Last Year:   . [employer]  of Food in the Last Year:   Transportation Needs:   . Film/video editor (Medical):   Marland Kitchen Lack of Transportation (Non-Medical):   Physical Activity:   . Days of Exercise per Week:   . Minutes of Exercise per Session:   Stress:   . Feeling of Stress :   Social Connections:   . Frequency of Communication with Friends and Family:   . Frequency of Social Gatherings with Friends and Family:   . Attends Religious Services:   . Active Member of Clubs or Organizations:   . Attends Archivist Meetings:   Marland Kitchen Marital Status:   Intimate Partner Violence:   . Fear of Current or Ex-Partner:   . Emotionally Abused:   Marland Kitchen Physically Abused:   . Sexually Abused:    Family History:  Family History  Problem Relation Age of Onset  . Kidney disease Mother   . Stroke Father   . Breast cancer Sister        breast progressed into bone  . Colon cancer Neg Hx   . Prostate cancer Neg Hx   . Pancreatic cancer Neg Hx     Review of Systems: Constitutional: Doesn't report fevers, chills or abnormal weight loss Eyes: Doesn't report blurriness of vision Ears, nose, mouth, throat, and face: Doesn't report sore throat Respiratory: Doesn't report cough, dyspnea or wheezes Cardiovascular: Doesn't report palpitation, chest discomfort  Gastrointestinal:  Doesn't report nausea, constipation,  diarrhea GU: Doesn't report incontinence Skin: Doesn't report skin rashes Neurological: Per HPI Musculoskeletal: Doesn't report joint pain Behavioral/Psych: Doesn't report anxiety  Physical Exam: Vitals:   09/12/19 1049  BP: (!) 136/96  Pulse: 69  Resp: 18  Temp: 98.7 F (37.1 C)  SpO2: 96%   KPS: 80. General: Alert, cooperative, pleasant, in no acute distress Head: Normal EENT: No conjunctival injection or scleral icterus.  Lungs: Resp effort normal Cardiac: Regular rate Abdomen: Non-distended abdomen Skin: No rashes cyanosis or petechiae. Extremities: No clubbing or edema  Neurologic Exam: Mental Status: Awake, alert, attentive to examiner. Oriented to self and environment. Language is fluent with intact comprehension.  Cranial Nerves: Visual acuity is grossly normal. Left hemianopia. Extra-ocular movements intact. No ptosis. Face is symmetric Motor: Tone and bulk are normal. Power is full in both arms and legs. Reflexes are symmetric, no pathologic reflexes present.  Sensory: Intact to light touch Gait: Dystaxic  Labs: I have reviewed the data as listed    Component Value Date/Time   NA 138 08/31/2019 0909   NA 139 05/04/2019 0954   K 4.3 08/31/2019 0909   CL 99 08/31/2019 0909   CO2 29 08/31/2019 0909   GLUCOSE 146 (H) 08/31/2019 0909   BUN 14 08/31/2019 0909   BUN 11 05/04/2019 0954   CREATININE 1.06 08/31/2019 0909   CALCIUM 9.4 08/31/2019 0909   PROT 7.1 08/31/2019 0909   PROT 6.8 05/04/2019 0954   ALBUMIN 3.7 08/31/2019 0909   ALBUMIN 4.5 05/04/2019 0954   AST 13 (L) 08/31/2019 0909   ALT 8 08/31/2019 0909   ALKPHOS 87 08/31/2019 0909   BILITOT 0.9 08/31/2019 0909   GFRNONAA >60 08/31/2019 0909   GFRAA >60 08/31/2019 0909   Lab Results  Component Value Date   WBC 4.5 08/31/2019   NEUTROABS 3.3 08/31/2019   HGB 11.1 (L) 08/31/2019   HCT 34.0 (L) 08/31/2019   MCV 87.6 08/31/2019   PLT 176 08/31/2019     Assessment/Plan Glioblastoma with  isocitrate dehydrogenase gene wildtype ([place]) [C71.9]  [name] is clinically stable today, now having completed 4 weeks of IMRT and concurrent Temodar.    We recommended continuing with course of intensity modulated radiation therapy and concurrent daily Temozolomide.  Radiation will be administered Mon-Fri over 6 weeks, Temodar will be dosed at '75mg'$ /m2 to be given daily over 42 days.    Chemotherapy should be held for the following:  ANC less than 1,000  Platelets less than 100,000  LFT or creatinine greater than 2x ULN  If clinical concerns/contraindications develop  Recommended continuing Decadron '4mg'$  daily until next visit.   Will start Keppra '500mg'$  BID due to episode of left arm shaking, concern for focal seizure.  He will return to clinic during week 6 of IMRT with labs for evaluation, or sooner/more frequent if needed.  All questions were answered. The patient knows to call the clinic with any problems, questions or concerns. No barriers to learning were detected.  The total time spent in the encounter was 30 minutes and more than 50% was on counseling and review of test results   Ventura Sellers, MD Medical Director of Neuro-Oncology [facility] [place] 09/12/19 10:53 AM

## 2019-09-13 ENCOUNTER — Telehealth: Payer: Self-pay | Admitting: Internal Medicine

## 2019-09-13 ENCOUNTER — Ambulatory Visit
Admission: RE | Admit: 2019-09-13 | Discharge: 2019-09-13 | Disposition: A | Discharge status: Home / Self Care | Payer: Medicare Other | Source: Ambulatory Visit | Attending: Radiation Oncology | Admitting: Radiation Oncology

## 2019-09-13 ENCOUNTER — Other Ambulatory Visit: Payer: Self-pay

## 2019-09-13 DIAGNOSIS — Z510 Encounter for antineoplastic radiation therapy: Secondary | ICD-10-CM | POA: Diagnosis not present

## 2019-09-13 DIAGNOSIS — C714 Malignant neoplasm of occipital lobe: Secondary | ICD-10-CM | POA: Diagnosis not present

## 2019-09-13 NOTE — Telephone Encounter (Signed)
Scheduled appt per 5/25 los.  Spoke with pt and they are aware of their appt date and time 

## 2019-09-14 ENCOUNTER — Inpatient Hospital Stay: Payer: Medicare Other

## 2019-09-14 ENCOUNTER — Ambulatory Visit
Admission: RE | Admit: 2019-09-14 | Discharge: 2019-09-14 | Disposition: A | Discharge status: Home / Self Care | Payer: Medicare Other | Source: Ambulatory Visit | Attending: Radiation Oncology | Admitting: Radiation Oncology

## 2019-09-14 ENCOUNTER — Ambulatory Visit: Payer: Medicare Other

## 2019-09-14 ENCOUNTER — Inpatient Hospital Stay: Payer: Medicare Other | Admitting: Internal Medicine

## 2019-09-14 DIAGNOSIS — Z510 Encounter for antineoplastic radiation therapy: Secondary | ICD-10-CM | POA: Diagnosis not present

## 2019-09-14 DIAGNOSIS — C714 Malignant neoplasm of occipital lobe: Secondary | ICD-10-CM | POA: Diagnosis not present

## 2019-09-15 ENCOUNTER — Ambulatory Visit
Admission: RE | Admit: 2019-09-15 | Discharge: 2019-09-15 | Disposition: A | Discharge status: Home / Self Care | Payer: Medicare Other | Source: Ambulatory Visit | Attending: Radiation Oncology | Admitting: Radiation Oncology

## 2019-09-15 ENCOUNTER — Other Ambulatory Visit: Payer: Self-pay

## 2019-09-15 DIAGNOSIS — Z510 Encounter for antineoplastic radiation therapy: Secondary | ICD-10-CM | POA: Diagnosis not present

## 2019-09-15 DIAGNOSIS — C714 Malignant neoplasm of occipital lobe: Secondary | ICD-10-CM | POA: Diagnosis not present

## 2019-09-19 ENCOUNTER — Ambulatory Visit
Admission: RE | Admit: 2019-09-19 | Discharge: 2019-09-19 | Disposition: A | Discharge status: Home / Self Care | Payer: Medicare Other | Source: Ambulatory Visit | Attending: Radiation Oncology | Admitting: Radiation Oncology

## 2019-09-19 ENCOUNTER — Other Ambulatory Visit: Payer: Self-pay

## 2019-09-19 DIAGNOSIS — Z510 Encounter for antineoplastic radiation therapy: Secondary | ICD-10-CM | POA: Diagnosis present

## 2019-09-19 DIAGNOSIS — C714 Malignant neoplasm of occipital lobe: Secondary | ICD-10-CM | POA: Insufficient documentation

## 2019-09-20 ENCOUNTER — Other Ambulatory Visit: Payer: Self-pay

## 2019-09-20 ENCOUNTER — Ambulatory Visit
Admission: RE | Admit: 2019-09-20 | Discharge: 2019-09-20 | Disposition: A | Discharge status: Home / Self Care | Payer: Medicare Other | Source: Ambulatory Visit | Attending: Radiation Oncology | Admitting: Radiation Oncology

## 2019-09-20 DIAGNOSIS — Z510 Encounter for antineoplastic radiation therapy: Secondary | ICD-10-CM | POA: Diagnosis not present

## 2019-09-21 ENCOUNTER — Other Ambulatory Visit: Payer: Self-pay

## 2019-09-21 ENCOUNTER — Ambulatory Visit
Admission: RE | Admit: 2019-09-21 | Discharge: 2019-09-21 | Disposition: A | Discharge status: Home / Self Care | Payer: Medicare Other | Source: Ambulatory Visit | Attending: Radiation Oncology | Admitting: Radiation Oncology

## 2019-09-21 ENCOUNTER — Ambulatory Visit: Payer: Medicare Other | Attending: Neurosurgery

## 2019-09-21 DIAGNOSIS — R2689 Other abnormalities of gait and mobility: Secondary | ICD-10-CM | POA: Insufficient documentation

## 2019-09-21 DIAGNOSIS — R42 Dizziness and giddiness: Secondary | ICD-10-CM | POA: Diagnosis not present

## 2019-09-21 DIAGNOSIS — R2681 Unsteadiness on feet: Secondary | ICD-10-CM | POA: Diagnosis present

## 2019-09-21 DIAGNOSIS — Z510 Encounter for antineoplastic radiation therapy: Secondary | ICD-10-CM | POA: Diagnosis not present

## 2019-09-21 NOTE — Therapy (Signed)
[facility] [address] [place], [place], [address] Phone: [phone]   Fax:  [phone]  Physical Therapy Treatment  Patient Details  Name: [name] MRN: [id number] Date of Birth: 1945/12/12 Referring Provider (PT): [name]   Encounter Date: 09/21/2019  PT End of Session - 09/21/19 1559    Visit Number  2    Number of Visits  13    Date for PT Re-Evaluation  10/16/19    Progress Note Due on Visit  10    PT Start Time  [id number]    PT Stop Time  1530    PT Time Calculation (min)  45 min    Equipment Utilized During Treatment  Gait belt    Activity Tolerance  Patient tolerated treatment well    Behavior During Therapy  [facility] for tasks assessed/performed       Past Medical History:  Diagnosis Date  . Anxiety   . Arthritis   . ASCVD (arteriosclerotic cardiovascular disease)    03/2004: DES x2 to the RCA; IMI with stent occlusion in 02/2005- suboptimal Plavix compliance  . CAD (coronary artery disease)   . Carcinoma of prostate ([place])    Clopidogrel held for biopsy  . Constipation due to pain medication   . Diabetes mellitus without complication ([place])   . Diabetic neuropathy ([place])   . Headache 2021  . Hyperlipidemia   . Hypertension   . Mild depression ([place])   . Morbid obesity ([place])   . Myocardial infarction ([place])   . Shortness of breath dyspnea   . Sleep apnea    no longer uses cpap  . Stroke ([place]) 2017  . Tobacco abuse    stopped smoking 06/28/09    Past Surgical History:  Procedure Laterality Date  . APPLICATION OF CRANIAL NAVIGATION N/A 07/04/2019   Procedure: APPLICATION OF CRANIAL NAVIGATION;  Surgeon: Ashok Pall, MD;  Location: [place];  Service: Neurosurgery;  Laterality: N/A;  . BACK SURGERY    . CARDIAC CATHETERIZATION     X 1 stent before having CABG  . CORONARY ARTERY BYPASS GRAFT  06/28/2008   X4  . CRANIOTOMY Right 07/04/2019   Procedure: Right occipital craniotomy for tumor with  brainlab;  Surgeon: Ashok Pall, MD;  Location: [place];  Service: Neurosurgery;  Laterality: Right;  . ENDARTERECTOMY Right 07/03/2015   Procedure: RIGHT CAROTID ENDARTERECTOMY WITH BOVINE PERICARDIUM PATCH ANGIOPLASTY;  Surgeon: Serafina Mitchell, MD;  Location: [place];  Service: Vascular;  Laterality: Right;  . LUMBAR LAMINECTOMY/DECOMPRESSION MICRODISCECTOMY N/A 09/06/2014   Procedure: LUMBER DECOMPRESSION L3-5;  Surgeon: Melina Schools, MD;  Location: [place];  Service: Orthopedics;  Laterality: N/A;  . LUMBAR SPINE SURGERY  2002  . PROSTATE BIOPSY    . PROSTATECTOMY  02/2007  . VASECTOMY      There were no vitals filed for this visit.      [facility] PT Assessment - 09/21/19 1501      Standardized Balance Assessment   Standardized Balance Assessment  Dynamic Gait Index;Four Square Step Test    Four Square Step Test  Trial One;Trial Two   39 sec, 41 sec     Dynamic Gait Index   Level Surface  Normal    Change in Gait Speed  Normal    Gait with Horizontal Head Turns  Mild Impairment    Gait with Vertical Head Turns  Mild Impairment    Gait and Pivot Turn  Normal    Step Over Obstacle  Moderate Impairment    Step Around Obstacles  Normal    Steps  Normal    Total Score  20        DGI, modified CTSIB and four square step test performed Standing on 2 pillows in corner with narrow BOS and EC: 2 x 1'     SLS, sit to stand without HHA,   Access Code: [email or web address]: [email or web address]: 06/03/2021Prepared by: Gwenyth Bouillon PatelExercisesStanding Balance in Corner with Eyes Closed - 1 x daily - 7 x weekly - 2 sets - 10 reps                PT Education - 09/21/19 1558    Education Details  Educated on increasing his walking endurnace by walking to and from his mailbox. Pt educated on looking down and when stepping over obstacles or stepping on steps/curb to improve visual acuity.    Person(s) Educated  Patient;Spouse    Methods  Explanation    Comprehension   Verbalized understanding       PT Short Term Goals - 09/05/19 0828      PT SHORT TERM GOAL #1   Title  Pt will be able to perform 5x sit to stand under 18 seconds to improve functional strengh    Baseline  21 s    Time  3    Period  Weeks    Status  New    Target Date  09/26/19      PT SHORT TERM GOAL #2   Title  Patient will be able to ambulate for 15 min without rest with use of his cane to improve walking endurnace    Baseline  5 min    Time  3    Period  Weeks    Status  New    Target Date  09/26/19        PT Long Term Goals - 09/21/19 1556      PT LONG TERM GOAL #1   Title  Pt will demo 52/56 or better on BBS to improve balance and reduce fall risk    Baseline  47/56 (eval)    Time  6    Period  Weeks    Status  New      PT LONG TERM GOAL #2   Title  Patient will be able to perform sit to stand in under 16 seconds to improve overall strength    Baseline  21 s    Time  6    Period  Weeks    Status  New      PT LONG TERM GOAL #3   Title  Patient will demo score of 22/24 or better on DGI to improve functional ambulation    Baseline  20/24 (09/21/19)    Time  6    Period  Weeks    Status  Revised      PT LONG TERM GOAL #4   Title  Patient will demo <30 sec on four square step test to improve risk of fall.    Baseline  42 sec (09/21/19)    Time  6    Period  Weeks    Status  Revised    Target Date  10/17/19            Plan - 09/21/19 1554    Clinical Impression Statement  Patient demonstrated 20/24 on dynamic gait index and 42 seconds on Four square step test (>12 sec indicates fall risk). Patient able to score 3/4 of  modified CTSIB test he was significantly challanged while standing on foam with eyes closed. Patient demonstrates decreased safety awarenss when stepping over obstacles.    Personal Factors and Comorbidities  Comorbidity 3+    Comorbidities  craniotomy secondary to glioblastoma, hx of lumbar surgeries x 2, CABG x 4    Examination-Activity  Limitations  Squat;Stairs;Locomotion Level;Caring for Others;Carry    Examination-Participation Restrictions  Cleaning;Community Activity;Driving;Laundry;Yard Work;Shop    Stability/Clinical Decision Making  Unstable/Unpredictable    Clinical Decision Making  High    Rehab Potential  Good    PT Frequency  2x / week    PT Duration  6 weeks    PT Treatment/Interventions  ADLs/Self Care Home Management;Gait training;Stair training;Functional mobility training;Therapeutic activities;Therapeutic exercise;Balance training;Neuromuscular re-education;Vestibular;Visual/perceptual remediation/compensation    PT Next Visit Plan  Work on stepping over obstacles, gait with horizontal and vertical head turns, four square step test    PT Home Exercise Plan  SLS, sit to stand without HHA       Patient will benefit from skilled therapeutic intervention in order to improve the following deficits and impairments:  Abnormal gait, Decreased activity tolerance, Decreased balance, Decreased mobility, Decreased endurance, Difficulty walking, Dizziness, Impaired vision/preception  Visit Diagnosis: Dizziness and giddiness  Unsteadiness on feet  Other abnormalities of gait and mobility     Problem List Patient Active Problem List   Diagnosis Date Noted  . Goals of care, counseling/discussion 07/13/2019  . S/P craniotomy 07/04/2019  . 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 Respiratory failure ([place])   . Encephalopathy acute   . Brain mass 07/01/2019  . Fever 07/01/2019  . Atherosclerosis of native artery of both lower extremities with intermittent claudication ([place]) 08/23/2018  . Coronary artery disease without angina pectoris 08/23/2018  . Dyspnea on exertion 08/23/2018  . Biochemically recurrent malignant neoplasm of prostate 08/16/2018  . S/P carotid endarterectomy 08/20/2015  . Essential hypertension 08/20/2015  . Type 2 diabetes mellitus with circulatory  disorder ([place]) 08/20/2015  . HLD (hyperlipidemia) 08/20/2015  . Carotid artery stenosis, symptomatic 07/03/2015  . Carotid stenosis, right   . TIA (transient ischemic attack) 06/18/2015  . ASCVD (arteriosclerotic cardiovascular disease) 06/18/2015  . Diabetes mellitus without complication ([place]) [id number]  . Resistant hypertension 06/18/2015  . Hyperlipidemia 06/18/2015  . Spinal stenosis of lumbar region with neurogenic claudication 09/06/2014  . Atherosclerosis of native artery of extremity with intermittent claudication ([place]) 03/09/2013  . HYPERLIPIDEMIA 04/08/2009  . TOBACCO ABUSE 04/08/2009  . ATHEROSCLEROTIC CARDIOVASCULAR DISEASE 04/08/2009  . INTERMITTENT VERTIGO 04/08/2009  . ADENOCARCINOMA, PROSTATE 03/21/2009  . ABDOMINAL PAIN -GENERALIZED 03/21/2009  . History of coronary artery bypass graft 06/28/2008    Kerrie Pleasure 09/21/2019, 4:02 PM  [facility] [address] [place], [place], [address] Phone: [phone]   Fax:  [phone]  Name: [name] MRN: [id number] Date of Birth: 1945/07/13

## 2019-09-22 ENCOUNTER — Other Ambulatory Visit: Payer: Self-pay

## 2019-09-22 ENCOUNTER — Ambulatory Visit
Admission: RE | Admit: 2019-09-22 | Discharge: 2019-09-22 | Disposition: A | Discharge status: Home / Self Care | Payer: Medicare Other | Source: Ambulatory Visit | Attending: Radiation Oncology | Admitting: Radiation Oncology

## 2019-09-22 ENCOUNTER — Other Ambulatory Visit: Payer: Self-pay | Admitting: Internal Medicine

## 2019-09-22 DIAGNOSIS — C719 Malignant neoplasm of brain, unspecified: Secondary | ICD-10-CM

## 2019-09-22 DIAGNOSIS — Z510 Encounter for antineoplastic radiation therapy: Secondary | ICD-10-CM | POA: Diagnosis not present

## 2019-09-25 ENCOUNTER — Ambulatory Visit
Admission: RE | Admit: 2019-09-25 | Discharge: 2019-09-25 | Disposition: A | Discharge status: Home / Self Care | Payer: Medicare Other | Source: Ambulatory Visit | Attending: Radiation Oncology | Admitting: Radiation Oncology

## 2019-09-25 ENCOUNTER — Other Ambulatory Visit: Payer: Self-pay

## 2019-09-25 DIAGNOSIS — Z510 Encounter for antineoplastic radiation therapy: Secondary | ICD-10-CM | POA: Diagnosis not present

## 2019-09-26 ENCOUNTER — Other Ambulatory Visit: Payer: Self-pay

## 2019-09-26 ENCOUNTER — Ambulatory Visit
Admission: RE | Admit: 2019-09-26 | Discharge: 2019-09-26 | Disposition: A | Discharge status: Home / Self Care | Payer: Medicare Other | Source: Ambulatory Visit | Attending: Radiation Oncology | Admitting: Radiation Oncology

## 2019-09-26 ENCOUNTER — Inpatient Hospital Stay: Payer: Medicare Other

## 2019-09-26 ENCOUNTER — Inpatient Hospital Stay: Payer: Medicare Other | Attending: Internal Medicine | Admitting: Internal Medicine

## 2019-09-26 VITALS — BP 135/60 | HR 62 | Temp 98.1°F | Resp 20 | Wt 219.3 lb

## 2019-09-26 DIAGNOSIS — C719 Malignant neoplasm of brain, unspecified: Secondary | ICD-10-CM

## 2019-09-26 DIAGNOSIS — Z510 Encounter for antineoplastic radiation therapy: Secondary | ICD-10-CM | POA: Diagnosis not present

## 2019-09-26 DIAGNOSIS — Z7189 Other specified counseling: Secondary | ICD-10-CM

## 2019-09-26 DIAGNOSIS — C714 Malignant neoplasm of occipital lobe: Secondary | ICD-10-CM | POA: Insufficient documentation

## 2019-09-26 LAB — CMP (CANCER CENTER ONLY)
ALT: 19 U/L (ref 0–44)
AST: 15 U/L (ref 15–41)
Albumin: 3.9 g/dL (ref 3.5–5.0)
Alkaline Phosphatase: 73 U/L (ref 38–126)
Anion gap: 11 (ref 5–15)
BUN: 18 mg/dL (ref 8–23)
CO2: 27 mmol/L (ref 22–32)
Calcium: 9.6 mg/dL (ref 8.9–10.3)
Chloride: 96 mmol/L — ABNORMAL LOW (ref 98–111)
Creatinine: 1.02 mg/dL (ref 0.61–1.24)
GFR, Est AFR Am: >60 mL/min (ref >60)
GFR, Estimated: >60 mL/min (ref >60)
Glucose, Bld: 120 mg/dL — ABNORMAL HIGH (ref 70–99)
Potassium: 4.3 mmol/L (ref 3.5–5.1)
Sodium: 134 mmol/L — ABNORMAL LOW (ref 135–145)
Total Bilirubin: 1.4 mg/dL — ABNORMAL HIGH (ref 0.3–1.2)
Total Protein: 7.1 g/dL (ref 6.5–8.1)

## 2019-09-26 LAB — CBC WITH DIFFERENTIAL (CANCER CENTER ONLY)
Abs Immature Granulocytes: 0.04 10*3/uL (ref 0.00–0.07)
Basophils Absolute: 0 10*3/uL (ref 0.0–0.1)
Basophils Relative: 0 %
Eosinophils Absolute: 0 10*3/uL (ref 0.0–0.5)
Eosinophils Relative: 0 %
HCT: 40.1 % (ref 39.0–52.0)
Hemoglobin: 13.1 g/dL (ref 13.0–17.0)
Immature Granulocytes: 1 %
Lymphocytes Relative: 8 %
Lymphs Abs: 0.6 10*3/uL — ABNORMAL LOW (ref 0.7–4.0)
MCH: 28.5 pg (ref 26.0–34.0)
MCHC: 32.7 g/dL (ref 30.0–36.0)
MCV: 87.2 fL (ref 80.0–100.0)
Monocytes Absolute: 0.7 10*3/uL (ref 0.1–1.0)
Monocytes Relative: 11 %
Neutro Abs: 5.7 10*3/uL (ref 1.7–7.7)
Neutrophils Relative %: 80 %
Platelet Count: 213 10*3/uL (ref 150–400)
RBC: 4.6 MIL/uL (ref 4.22–5.81)
RDW: 13.3 % (ref 11.5–15.5)
WBC Count: 7.1 10*3/uL (ref 4.0–10.5)
nRBC: 0 % (ref 0.0–0.2)

## 2019-09-26 NOTE — Progress Notes (Signed)
[facility] [place], [place] [address] [phone]   Interval Evaluation  Date of Service: 09/26/19 Patient Name: [name] Patient MRN: [id number] Patient DOB: 12-21-1945 Provider: Ventura Sellers, MD  Identifying Statement:  [name] is a 74 y.o. male with right occipital glioblastoma    Referring Provider: Maurice Small, MD [place] [address],  [place] [address]  Oncologic History: Oncology History  Glioblastoma with isocitrate dehydrogenase gene wildtype [facility])  07/04/2019 Surgery   Debulking resection by Dr. Christella Noa; path demonstrates Glioblastoma   08/17/2019 -  Radiation Therapy   IMRT with concurrent daily Temozolomide 53m/m2     Biomarkers:  MGMT Unmethylated.  IDH 1/2 Wild type.  EGFR Not expressed  TERT Unknown   Interval History:  Mark VALLONEpresents to clinic today, now in final week of radiation and chemo.  No recurrent episodes of confusion, nausea or vomiting. No recent seizures.  Fatigue has been somewhat increased although only modestly. [name] been resumed without further blood in urine.  He has been walking primarily with a cane assist. Overall his left sided vision is stable from prior.  Continues on decadron 238mtwice per day.  H+P (07/13/19) Patient presented in early March 2021 with several days of progressive left sided visual impairment.  He noticed "missing things" in the left side of his field in his work and while driving.  He then developed headaches which were new for him.  Never complained of difficulty walking, speaking, using arms or hands.  CNS imaging demonstrated tumor within right occipital lobe, which was biopsied by Dr. CaChristella Noan 07/04/19.  Following surgery, he has no new complaints or progressive deficits.  He may be taking decadron 68m35mwice per day.  Otherwise his functional status is only limited by visual impairment. Worked at [employer] lives with his wife of 50 4ars.  Medications: Current Outpatient Medications on File Prior to Visit  Medication Sig Dispense Refill  . amLODipine (NORVASC) 10 MG tablet TAKE 1 TABLET BY MOUTH DAILY 90 tablet 0  . atorvastatin (LIPITOR) 80 MG tablet TAKE 1 TABLET BY MOUTH DAILY AT 6 PM (Patient taking differently: TAKE 1 TABLET BY MOUTH DAILY AT 6 PM) 90 tablet 1  . dexamethasone (DECADRON) 2 MG tablet Take 1 tablet (2 mg total) by mouth daily. 30 tablet 0  . levETIRAcetam (KEPPRA) 500 MG tablet Take 1 tablet (500 mg total) by mouth 2 (two) times daily. 60 tablet 3  . losartan-hydrochlorothiazide (HYZAAR) 100-12.5 MG tablet TAKE 1 TABLET BY MOUTH EVERY DAY 90 tablet 0  . Magnesium Hydroxide (DULCOLAX PO) Take 1 tablet by mouth daily as needed (constipation).    . metoprolol succinate (TOPROL-XL) 25 MG 24 hr tablet TAKE 1 TABLET BY MOUTH DAILY (Patient taking differently: Take 25 mg by mouth daily. ) 90 tablet 1  . polyethylene glycol (MIRALAX / GLYCOLAX) 17 g packet Take 17 g by mouth daily.    . rivaroxaban (XARELTO) 20 MG TABS tablet Take 1 tablet (20 mg total) by mouth daily with supper. 30 tablet 3  . temozolomide (TEMODAR) 140 MG capsule Take 1 capsule (140 mg total) by mouth daily. May take on an empty stomach to decrease nausea & vomiting. 42 capsule 0  . LORazepam (ATIVAN) 0.5 MG tablet 1 tab po q 4-6 hours prn or 1 tab po 30 minutes prior to MRI or radiation (Patient not taking: Reported  on 09/26/2019) 30 tablet 0  . ondansetron (ZOFRAN) 8 MG tablet Take 8 mg by mouth 2 (two) times daily as needed.     No current facility-administered medications on file prior to visit.    Allergies:  Allergies  Allergen Reactions  . Lisinopril Cough   Past Medical History:  Past Medical History:  Diagnosis Date  . Anxiety   . Arthritis   . ASCVD (arteriosclerotic cardiovascular disease)    03/2004: DES x2 to the RCA; IMI with stent occlusion in 02/2005- suboptimal Plavix  compliance  . CAD (coronary artery disease)   . Carcinoma of prostate ([place])    Clopidogrel held for biopsy  . Constipation due to pain medication   . Diabetes mellitus without complication ([place])   . Diabetic neuropathy ([place])   . Headache 2021  . Hyperlipidemia   . Hypertension   . Mild depression ([place])   . Morbid obesity ([place])   . Myocardial infarction ([place])   . Shortness of breath dyspnea   . Sleep apnea    no longer uses cpap  . Stroke ([place]) 2017  . Tobacco abuse    stopped smoking 06/28/09   Past Surgical History:  Past Surgical History:  Procedure Laterality Date  . APPLICATION OF CRANIAL NAVIGATION N/A 07/04/2019   Procedure: APPLICATION OF CRANIAL NAVIGATION;  Surgeon: Ashok Pall, MD;  Location: [facility];  Service: Neurosurgery;  Laterality: N/A;  . BACK SURGERY    . CARDIAC CATHETERIZATION     X 1 stent before having CABG  . CORONARY ARTERY BYPASS GRAFT  06/28/2008   X4  . CRANIOTOMY Right 07/04/2019   Procedure: Right occipital craniotomy for tumor with brainlab;  Surgeon: Ashok Pall, MD;  Location: [facility];  Service: Neurosurgery;  Laterality: Right;  . ENDARTERECTOMY Right 07/03/2015   Procedure: RIGHT CAROTID ENDARTERECTOMY WITH BOVINE PERICARDIUM PATCH ANGIOPLASTY;  Surgeon: Serafina Mitchell, MD;  Location: [place];  Service: Vascular;  Laterality: Right;  . LUMBAR LAMINECTOMY/DECOMPRESSION MICRODISCECTOMY N/A 09/06/2014   Procedure: LUMBER DECOMPRESSION L3-5;  Surgeon: Melina Schools, MD;  Location: [place];  Service: Orthopedics;  Laterality: N/A;  . LUMBAR SPINE SURGERY  2002  . PROSTATE BIOPSY    . PROSTATECTOMY  02/2007  . VASECTOMY     Social History:  Social History   Socioeconomic History  . Marital status: Married    Spouse name: Not on file  . Number of children: 4  . Years of education: Not on file  . Highest education level: Not on file  Occupational History  . Occupation: Technical brewer    Comment: retired  . Occupation: Public relations account executive: [employer]    Comment: full time  Tobacco Use  . Smoking status: Former Smoker    Packs/day: 1.00    Years: 40.00    Pack years: 40.00    Types: Cigarettes    Quit date: 06/28/2008    Years since quitting: 11.2  . Smokeless tobacco: Former Systems developer    Quit date: 06/28/2009  Substance and Sexual Activity  . Alcohol use: Yes    Alcohol/week: 3.0 standard drinks    Types: 2 Glasses of wine, 1 Cans of beer per week    Comment: may have 1 drink a week  . Drug use: No  . Sexual activity: Not on file  Other Topics Concern  . Not on file  Social History Narrative   Married with 3 sons and 1 daughter   Daily caffeine use, 3  per day   Social Determinants of Health   Financial Resource Strain:   . Difficulty of Paying Living Expenses:   Food Insecurity:   . Worried About Charity fundraiser in the Last Year:   . Arboriculturist in the Last Year:   Transportation Needs:   . Film/video editor (Medical):   Marland Kitchen Lack of Transportation (Non-Medical):   Physical Activity:   . Days of Exercise per Week:   . Minutes of Exercise per Session:   Stress:   . Feeling of Stress :   Social Connections:   . Frequency of Communication with Friends and Family:   . Frequency of Social Gatherings with Friends and Family:   . Attends Religious Services:   . Active Member of Clubs or Organizations:   . Attends Archivist Meetings:   Marland Kitchen Marital Status:   Intimate Partner Violence:   . Fear of Current or Ex-Partner:   . Emotionally Abused:   Marland Kitchen Physically Abused:   . Sexually Abused:    Family History:  Family History  Problem Relation Age of Onset  . Kidney disease Mother   . Stroke Father   . Breast cancer Sister        breast progressed into bone  . Colon cancer Neg Hx   . Prostate cancer Neg Hx   . Pancreatic cancer Neg Hx     Review of Systems: Constitutional: Doesn't report fevers, chills or abnormal weight loss Eyes: Doesn't report blurriness of  vision Ears, nose, mouth, throat, and face: Doesn't report sore throat Respiratory: Doesn't report cough, dyspnea or wheezes Cardiovascular: Doesn't report palpitation, chest discomfort  Gastrointestinal:  Doesn't report nausea, constipation, diarrhea GU: Doesn't report incontinence Skin: Doesn't report skin rashes Neurological: Per HPI Musculoskeletal: Doesn't report joint pain Behavioral/Psych: +depression symptoms  Physical Exam: Vitals:   09/26/19 0927  BP: 135/60  Pulse: 62  Resp: 20  Temp: 98.1 F (36.7 C)  SpO2: 97%   KPS: 80. General: Alert, cooperative, pleasant, in no acute distress Head: Normal EENT: No conjunctival injection or scleral icterus.  Lungs: Resp effort normal Cardiac: Regular rate Abdomen: Non-distended abdomen Skin: No rashes cyanosis or petechiae. Extremities: No clubbing or edema  Neurologic Exam: Mental Status: Awake, alert, attentive to examiner. Oriented to self and environment. Language is fluent with intact comprehension.  Cranial Nerves: Visual acuity is grossly normal. Left hemianopia. Extra-ocular movements intact. No ptosis. Face is symmetric Motor: Tone and bulk are normal. Power is full in both arms and legs. Reflexes are symmetric, no pathologic reflexes present.  Sensory: Intact to light touch Gait: Dystaxic  Labs: I have reviewed the data as listed    Component Value Date/Time   NA 134 (L) 09/26/2019 0913   NA 139 05/04/2019 0954   K 4.3 09/26/2019 0913   CL 96 (L) 09/26/2019 0913   CO2 27 09/26/2019 0913   GLUCOSE 120 (H) 09/26/2019 0913   BUN 18 09/26/2019 0913   BUN 11 05/04/2019 0954   CREATININE 1.02 09/26/2019 0913   CALCIUM 9.6 09/26/2019 0913   PROT 7.1 09/26/2019 0913   PROT 6.8 05/04/2019 0954   ALBUMIN 3.9 09/26/2019 0913   ALBUMIN 4.5 05/04/2019 0954   AST 15 09/26/2019 0913   ALT 19 09/26/2019 0913   ALKPHOS 73 09/26/2019 0913   BILITOT 1.4 (H) 09/26/2019 0913   GFRNONAA >60 09/26/2019 0913   GFRAA >60  09/26/2019 0913   Lab Results  Component Value Date  WBC 7.1 09/26/2019   NEUTROABS 5.7 09/26/2019   HGB 13.1 09/26/2019   HCT 40.1 09/26/2019   MCV 87.2 09/26/2019   PLT 213 09/26/2019     Assessment/Plan Glioblastoma with isocitrate dehydrogenase gene wildtype (HCC) [C71.9]    [name] is clinically stable today, now in final week of IMRT and concurrent Temodar.    We recommended continuing with course of intensity modulated radiation therapy and concurrent daily Temozolomide.  Radiation will be administered Mon-Fri over 6 weeks, Temodar will be dosed at 83m/m2 to be given daily over 42 days.    Chemotherapy should be held for the following:  ANC less than 1,000  Platelets less than 100,000  LFT or creatinine greater than 2x ULN  If clinical concerns/contraindications develop  Recommended gradually weaning Decadron, starting with 323mdaily x7 days, then 58m60maily x7 days, then 1mg69mily x7 days  Will con't Keppra 500mg31m seizure prevention.  He will return to clinic in 1 month with post-RT MRI brain for evaluation.  May pre-treat with Ativan 0.5mg 364m0min 55mr to MRI for claustrophobia.  All questions were answered. The patient knows to call the clinic with any problems, questions or concerns. No barriers to learning were detected.  The total time spent in the encounter was 30 minutes and more than 50% was on counseling and review of test results   Mark Sellersdical Director of Neuro-Oncology [facility] [place]21 10:43 AM

## 2019-09-27 ENCOUNTER — Ambulatory Visit
Admission: RE | Admit: 2019-09-27 | Discharge: 2019-09-27 | Disposition: A | Discharge status: Home / Self Care | Payer: Medicare Other | Source: Ambulatory Visit | Attending: Radiation Oncology | Admitting: Radiation Oncology

## 2019-09-27 ENCOUNTER — Telehealth: Payer: Self-pay | Admitting: Internal Medicine

## 2019-09-27 ENCOUNTER — Other Ambulatory Visit: Payer: Self-pay

## 2019-09-27 DIAGNOSIS — Z510 Encounter for antineoplastic radiation therapy: Secondary | ICD-10-CM | POA: Diagnosis not present

## 2019-09-27 NOTE — Telephone Encounter (Signed)
Scheduled appt per 6/8 los.  Spoke with pt and he is aware of the appt date and time. 

## 2019-09-28 ENCOUNTER — Encounter: Payer: Self-pay | Admitting: Radiation Oncology

## 2019-09-28 ENCOUNTER — Inpatient Hospital Stay: Payer: Medicare Other | Admitting: Internal Medicine

## 2019-09-28 ENCOUNTER — Inpatient Hospital Stay: Payer: Medicare Other

## 2019-09-28 ENCOUNTER — Other Ambulatory Visit: Payer: Self-pay

## 2019-09-28 ENCOUNTER — Ambulatory Visit
Admission: RE | Admit: 2019-09-28 | Discharge: 2019-09-28 | Disposition: A | Discharge status: Home / Self Care | Payer: Medicare Other | Source: Ambulatory Visit | Attending: Radiation Oncology | Admitting: Radiation Oncology

## 2019-09-28 DIAGNOSIS — Z510 Encounter for antineoplastic radiation therapy: Secondary | ICD-10-CM | POA: Diagnosis not present

## 2019-09-29 ENCOUNTER — Ambulatory Visit: Payer: Medicare Other

## 2019-10-04 ENCOUNTER — Other Ambulatory Visit: Payer: Self-pay | Admitting: Cardiology

## 2019-10-04 ENCOUNTER — Other Ambulatory Visit: Payer: Self-pay

## 2019-10-04 MED ORDER — LOSARTAN POTASSIUM-HCTZ 100-12.5 MG PO TABS: 1.0000 | ORAL_TABLET | Freq: Every day | ORAL | 2 refills | Status: DC

## 2019-10-05 ENCOUNTER — Ambulatory Visit: Payer: Medicare Other | Admitting: Occupational Therapy

## 2019-10-06 ENCOUNTER — Ambulatory Visit: Payer: Medicare Other

## 2019-10-08 ENCOUNTER — Encounter (HOSPITAL_COMMUNITY): Payer: Self-pay | Admitting: Emergency Medicine

## 2019-10-08 ENCOUNTER — Inpatient Hospital Stay (HOSPITAL_COMMUNITY)
Admission: EM | Admit: 2019-10-08 | Discharge: 2019-10-11 | DRG: 081 | Disposition: A | Discharge status: Rehabilitation Facility | Payer: Medicare Other | Attending: Internal Medicine | Admitting: Internal Medicine

## 2019-10-08 ENCOUNTER — Emergency Department (HOSPITAL_COMMUNITY): Payer: Medicare Other

## 2019-10-08 DIAGNOSIS — G936 Cerebral edema: Secondary | ICD-10-CM | POA: Diagnosis present

## 2019-10-08 DIAGNOSIS — Z20822 Contact with and (suspected) exposure to covid-19: Secondary | ICD-10-CM | POA: Diagnosis present

## 2019-10-08 DIAGNOSIS — I110 Hypertensive heart disease with heart failure: Secondary | ICD-10-CM | POA: Diagnosis present

## 2019-10-08 DIAGNOSIS — R531 Weakness: Secondary | ICD-10-CM

## 2019-10-08 DIAGNOSIS — R001 Bradycardia, unspecified: Secondary | ICD-10-CM | POA: Diagnosis present

## 2019-10-08 DIAGNOSIS — Z298 Encounter for other specified prophylactic measures: Secondary | ICD-10-CM | POA: Diagnosis not present

## 2019-10-08 DIAGNOSIS — Z87891 Personal history of nicotine dependence: Secondary | ICD-10-CM

## 2019-10-08 DIAGNOSIS — K5900 Constipation, unspecified: Secondary | ICD-10-CM | POA: Diagnosis present

## 2019-10-08 DIAGNOSIS — I252 Old myocardial infarction: Secondary | ICD-10-CM | POA: Diagnosis not present

## 2019-10-08 DIAGNOSIS — F329 Major depressive disorder, single episode, unspecified: Secondary | ICD-10-CM | POA: Diagnosis present

## 2019-10-08 DIAGNOSIS — Z9079 Acquired absence of other genital organ(s): Secondary | ICD-10-CM

## 2019-10-08 DIAGNOSIS — G8194 Hemiplegia, unspecified affecting left nondominant side: Secondary | ICD-10-CM | POA: Diagnosis present

## 2019-10-08 DIAGNOSIS — I10 Essential (primary) hypertension: Secondary | ICD-10-CM | POA: Diagnosis not present

## 2019-10-08 DIAGNOSIS — G4730 Sleep apnea, unspecified: Secondary | ICD-10-CM | POA: Diagnosis present

## 2019-10-08 DIAGNOSIS — Z8673 Personal history of transient ischemic attack (TIA), and cerebral infarction without residual deficits: Secondary | ICD-10-CM | POA: Diagnosis not present

## 2019-10-08 DIAGNOSIS — R310 Gross hematuria: Secondary | ICD-10-CM | POA: Diagnosis present

## 2019-10-08 DIAGNOSIS — C719 Malignant neoplasm of brain, unspecified: Secondary | ICD-10-CM | POA: Diagnosis not present

## 2019-10-08 DIAGNOSIS — I5032 Chronic diastolic (congestive) heart failure: Secondary | ICD-10-CM

## 2019-10-08 DIAGNOSIS — Z515 Encounter for palliative care: Secondary | ICD-10-CM | POA: Diagnosis present

## 2019-10-08 DIAGNOSIS — Z888 Allergy status to other drugs, medicaments and biological substances status: Secondary | ICD-10-CM

## 2019-10-08 DIAGNOSIS — Z923 Personal history of irradiation: Secondary | ICD-10-CM | POA: Diagnosis not present

## 2019-10-08 DIAGNOSIS — Z7901 Long term (current) use of anticoagulants: Secondary | ICD-10-CM | POA: Diagnosis not present

## 2019-10-08 DIAGNOSIS — E119 Type 2 diabetes mellitus without complications: Secondary | ICD-10-CM | POA: Diagnosis not present

## 2019-10-08 DIAGNOSIS — Z951 Presence of aortocoronary bypass graft: Secondary | ICD-10-CM

## 2019-10-08 DIAGNOSIS — E785 Hyperlipidemia, unspecified: Secondary | ICD-10-CM | POA: Diagnosis present

## 2019-10-08 DIAGNOSIS — Z823 Family history of stroke: Secondary | ICD-10-CM

## 2019-10-08 DIAGNOSIS — E1142 Type 2 diabetes mellitus with diabetic polyneuropathy: Secondary | ICD-10-CM | POA: Diagnosis present

## 2019-10-08 DIAGNOSIS — R2689 Other abnormalities of gait and mobility: Secondary | ICD-10-CM | POA: Diagnosis present

## 2019-10-08 DIAGNOSIS — F419 Anxiety disorder, unspecified: Secondary | ICD-10-CM | POA: Diagnosis present

## 2019-10-08 DIAGNOSIS — Z7952 Long term (current) use of systemic steroids: Secondary | ICD-10-CM | POA: Diagnosis not present

## 2019-10-08 DIAGNOSIS — Z7189 Other specified counseling: Secondary | ICD-10-CM | POA: Diagnosis not present

## 2019-10-08 DIAGNOSIS — C714 Malignant neoplasm of occipital lobe: Secondary | ICD-10-CM | POA: Diagnosis present

## 2019-10-08 DIAGNOSIS — I2510 Atherosclerotic heart disease of native coronary artery without angina pectoris: Secondary | ICD-10-CM | POA: Diagnosis present

## 2019-10-08 DIAGNOSIS — Z8546 Personal history of malignant neoplasm of prostate: Secondary | ICD-10-CM | POA: Diagnosis not present

## 2019-10-08 DIAGNOSIS — Z79899 Other long term (current) drug therapy: Secondary | ICD-10-CM | POA: Diagnosis not present

## 2019-10-08 DIAGNOSIS — Z9221 Personal history of antineoplastic chemotherapy: Secondary | ICD-10-CM | POA: Diagnosis not present

## 2019-10-08 DIAGNOSIS — Z803 Family history of malignant neoplasm of breast: Secondary | ICD-10-CM | POA: Diagnosis not present

## 2019-10-08 LAB — TYPE AND SCREEN
ABO/RH(D): A POS
Antibody Screen: NEGATIVE

## 2019-10-08 LAB — CBC WITH DIFFERENTIAL/PLATELET
Abs Immature Granulocytes: 0.02 10*3/uL (ref 0.00–0.07)
Basophils Absolute: 0 10*3/uL (ref 0.0–0.1)
Basophils Relative: 0 %
Eosinophils Absolute: 0 10*3/uL (ref 0.0–0.5)
Eosinophils Relative: 0 %
HCT: 37.8 % — ABNORMAL LOW (ref 39.0–52.0)
Hemoglobin: 12.7 g/dL — ABNORMAL LOW (ref 13.0–17.0)
Immature Granulocytes: 0 %
Lymphocytes Relative: 5 %
Lymphs Abs: 0.3 10*3/uL — ABNORMAL LOW (ref 0.7–4.0)
MCH: 29.1 pg (ref 26.0–34.0)
MCHC: 33.6 g/dL (ref 30.0–36.0)
MCV: 86.5 fL (ref 80.0–100.0)
Monocytes Absolute: 0.6 10*3/uL (ref 0.1–1.0)
Monocytes Relative: 11 %
Neutro Abs: 4.3 10*3/uL (ref 1.7–7.7)
Neutrophils Relative %: 84 %
Platelets: 151 10*3/uL (ref 150–400)
RBC: 4.37 MIL/uL (ref 4.22–5.81)
RDW: 13.3 % (ref 11.5–15.5)
WBC: 5.2 10*3/uL (ref 4.0–10.5)
nRBC: 0 % (ref 0.0–0.2)

## 2019-10-08 LAB — COMPREHENSIVE METABOLIC PANEL
ALT: 29 U/L (ref 0–44)
AST: 20 U/L (ref 15–41)
Albumin: 3.5 g/dL (ref 3.5–5.0)
Alkaline Phosphatase: 73 U/L (ref 38–126)
Anion gap: 10 (ref 5–15)
BUN: 16 mg/dL (ref 8–23)
CO2: 26 mmol/L (ref 22–32)
Calcium: 8.9 mg/dL (ref 8.9–10.3)
Chloride: 96 mmol/L — ABNORMAL LOW (ref 98–111)
Creatinine, Ser: 0.88 mg/dL (ref 0.61–1.24)
GFR calc Af Amer: >60 mL/min (ref >60)
GFR calc non Af Amer: >60 mL/min (ref >60)
Glucose, Bld: 171 mg/dL — ABNORMAL HIGH (ref 70–99)
Potassium: 3.8 mmol/L (ref 3.5–5.1)
Sodium: 132 mmol/L — ABNORMAL LOW (ref 135–145)
Total Bilirubin: 1.5 mg/dL — ABNORMAL HIGH (ref 0.3–1.2)
Total Protein: 6.2 g/dL — ABNORMAL LOW (ref 6.5–8.1)

## 2019-10-08 LAB — CBG MONITORING, ED: Glucose-Capillary: 152 mg/dL — ABNORMAL HIGH (ref 70–99)

## 2019-10-08 LAB — PROTIME-INR
INR: 1 (ref 0.8–1.2)
Prothrombin Time: 12.5 seconds (ref 11.4–15.2)

## 2019-10-08 LAB — SARS CORONAVIRUS 2 BY RT PCR (HOSPITAL ORDER, PERFORMED IN ~~LOC~~ HOSPITAL LAB): SARS Coronavirus 2: NEGATIVE

## 2019-10-08 MED ORDER — ACETAMINOPHEN 650 MG RE SUPP: 650.0000 mg | Freq: Four times a day (QID) | RECTAL | Status: DC | PRN

## 2019-10-08 MED ORDER — DEXAMETHASONE SODIUM PHOSPHATE 4 MG/ML IJ SOLN
4.0000 mg | Freq: Four times a day (QID) | INTRAMUSCULAR | Status: DC
  Administered 2019-10-09 – 2019-10-10 (×5): 4 mg via INTRAVENOUS
  Filled 2019-10-08 (×5): qty 1

## 2019-10-08 MED ORDER — AMLODIPINE BESYLATE 10 MG PO TABS
10.0000 mg | ORAL_TABLET | Freq: Every evening | ORAL | Status: DC
  Administered 2019-10-08 – 2019-10-11 (×4): 10 mg via ORAL
  Filled 2019-10-08 (×2): qty 1
  Filled 2019-10-08: qty 2
  Filled 2019-10-08: qty 1

## 2019-10-08 MED ORDER — POLYETHYLENE GLYCOL 3350 17 G PO PACK: 17.0000 g | PACK | Freq: Every day | ORAL | Status: DC | PRN

## 2019-10-08 MED ORDER — INSULIN ASPART 100 UNIT/ML ~~LOC~~ SOLN: 0.0000 [IU] | Freq: Every day | SUBCUTANEOUS | Status: DC

## 2019-10-08 MED ORDER — DEXAMETHASONE SODIUM PHOSPHATE 10 MG/ML IJ SOLN
10.0000 mg | Freq: Once | INTRAMUSCULAR | Status: AC
  Administered 2019-10-08: 10 mg via INTRAVENOUS
  Filled 2019-10-08: qty 1

## 2019-10-08 MED ORDER — MORPHINE SULFATE (PF) 4 MG/ML IV SOLN
4.0000 mg | Freq: Once | INTRAVENOUS | Status: AC
  Administered 2019-10-08: 4 mg via INTRAVENOUS
  Filled 2019-10-08: qty 1

## 2019-10-08 MED ORDER — LEVETIRACETAM 500 MG PO TABS
500.0000 mg | ORAL_TABLET | Freq: Two times a day (BID) | ORAL | Status: DC
  Administered 2019-10-08 – 2019-10-11 (×6): 500 mg via ORAL
  Filled 2019-10-08 (×6): qty 1

## 2019-10-08 MED ORDER — ONDANSETRON HCL 4 MG/2ML IJ SOLN
4.0000 mg | Freq: Once | INTRAMUSCULAR | Status: AC
  Administered 2019-10-08: 4 mg via INTRAVENOUS
  Filled 2019-10-08: qty 2

## 2019-10-08 MED ORDER — ACETAMINOPHEN 325 MG PO TABS: 650.0000 mg | ORAL_TABLET | Freq: Four times a day (QID) | ORAL | Status: DC | PRN

## 2019-10-08 MED ORDER — MORPHINE SULFATE (PF) 2 MG/ML IV SOLN: 2.0000 mg | INTRAVENOUS | Status: DC | PRN

## 2019-10-08 MED ORDER — METOPROLOL SUCCINATE ER 25 MG PO TB24
25.0000 mg | ORAL_TABLET | Freq: Every evening | ORAL | Status: DC
  Administered 2019-10-08 – 2019-10-11 (×4): 25 mg via ORAL
  Filled 2019-10-08 (×4): qty 1

## 2019-10-08 MED ORDER — INSULIN ASPART 100 UNIT/ML ~~LOC~~ SOLN
0.0000 [IU] | Freq: Three times a day (TID) | SUBCUTANEOUS | Status: DC
  Administered 2019-10-09: 5 [IU] via SUBCUTANEOUS
  Administered 2019-10-09 (×2): 3 [IU] via SUBCUTANEOUS
  Administered 2019-10-10 (×2): 2 [IU] via SUBCUTANEOUS
  Administered 2019-10-10 – 2019-10-11 (×2): 3 [IU] via SUBCUTANEOUS
  Administered 2019-10-11: 2 [IU] via SUBCUTANEOUS
  Administered 2019-10-11: 3 [IU] via SUBCUTANEOUS

## 2019-10-08 MED ORDER — ATORVASTATIN CALCIUM 80 MG PO TABS
80.0000 mg | ORAL_TABLET | Freq: Every day | ORAL | Status: DC
  Administered 2019-10-08 – 2019-10-10 (×3): 80 mg via ORAL
  Filled 2019-10-08 (×3): qty 1

## 2019-10-08 MED ORDER — ONDANSETRON HCL 4 MG/2ML IJ SOLN: 4.0000 mg | Freq: Four times a day (QID) | INTRAMUSCULAR | Status: DC | PRN

## 2019-10-08 NOTE — ED Triage Notes (Signed)
BIB EMS from home, LKW 2300 10/07/19. Woke up this AM 0630 with L sided weakness. Patient noted to have L arm weakness, L leg weakness, visual disturbances. History of right occipital glioblastoma

## 2019-10-08 NOTE — H&P (Signed)
History and Physical    [name]:[id number] DOB: 09-May-1945 DOA: 10/08/2019  PCP: Mark Small, MD Patient coming from: Home  Chief Complaint: Left-sided weakness  HPI: [name] is a 74 y.o. 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  History provided by patient and his wife at bedside.  He has had left-sided weakness due to his brain tumor but it has been getting progressively worse.  He has been very weak and unsteady when walking.  For the past 1 day he has been complaining of severe headaches and has been vomiting.  Dose of his home Decadron is currently being tapered down.  Wife states he finished chemo and radiation on June 10 and plan was to follow-up with Dr. Mickeal [name] in July.  States yesterday patient had blood in his urine.  This has happened to him in the past as he is on Xarelto.  Today his urine has been clear.  Patient denies flank pain, abdominal pain, or dysuria.  Denies history of kidney stones.  Wife states Xarelto was started back in March at the time of his brain surgery and they were told he had a clot in his brain.  States last time he had blood in his urine, Dr. Mickeal [name] had held his Xarelto but after his urine was clear for a week it was resumed.  No other complaints.  Patient has been vaccinated for Covid.  Denies cough, shortness of breath, or chest pain.  Denies abdominal pain or diarrhea.  ED Course: Vital signs stable.  Labs showing no leukocytosis.  Hemoglobin 12.7, no significant change from baseline.  Platelet count normal.  Corrected sodium 133, stable compared to prior labs.    Head CT showing new  severe vasogenic edema throughout the posterior right cerebral hemisphere with 8 mm of leftward midline shift.  No evidence of acute intracranial abnormality.  Patient received IV Decadron 10 mg, morphine, and Zofran.  No neuro-oncology available at night.  ED provider discussed the case with Dr. Christella Noa from neurosurgery -no additional recommendations.  Review of Systems:  All systems reviewed and apart from history of presenting illness, are negative.  Past Medical History:  Diagnosis Date  . Anxiety   . Arthritis   . ASCVD (arteriosclerotic cardiovascular disease)    03/2004: DES x2 to the RCA; IMI with stent occlusion in 02/2005- suboptimal Plavix compliance  . CAD (coronary artery disease)   . Carcinoma of prostate ([facility])    Clopidogrel held for biopsy  . Constipation due to pain medication   . Diabetes mellitus without complication ([place])   . Diabetic neuropathy ([place])   . Headache 2021  . Hyperlipidemia   . Hypertension   . Mild depression ([place])   . Morbid obesity ([place])   . Myocardial infarction ([place])   . Shortness of breath dyspnea   . Sleep apnea    no longer uses cpap  . Stroke ([place]) 2017  . Tobacco abuse    stopped smoking 06/28/09    Past Surgical History:  Procedure Laterality Date  . APPLICATION OF CRANIAL NAVIGATION N/A 07/04/2019   Procedure: APPLICATION OF CRANIAL NAVIGATION;  Surgeon: Ashok Pall, MD;  Location: [facility];  Service: Neurosurgery;  Laterality: N/A;  . BACK  SURGERY    . CARDIAC CATHETERIZATION     X 1 stent before having CABG  . CORONARY ARTERY BYPASS GRAFT  06/28/2008   X4  . CRANIOTOMY Right 07/04/2019   Procedure: Right occipital craniotomy for tumor with brainlab;  Surgeon: Ashok Pall, MD;  Location: [facility];  Service: Neurosurgery;  Laterality: Right;  . ENDARTERECTOMY Right 07/03/2015   Procedure: RIGHT CAROTID ENDARTERECTOMY WITH BOVINE PERICARDIUM PATCH ANGIOPLASTY;  Surgeon: Serafina Mitchell, MD;  Location: [facility];  Service: Vascular;   Laterality: Right;  . LUMBAR LAMINECTOMY/DECOMPRESSION MICRODISCECTOMY N/A 09/06/2014   Procedure: LUMBER DECOMPRESSION L3-5;  Surgeon: Melina Schools, MD;  Location: [facility];  Service: Orthopedics;  Laterality: N/A;  . LUMBAR SPINE SURGERY  2002  . PROSTATE BIOPSY    . PROSTATECTOMY  02/2007  . VASECTOMY       reports that he quit smoking about 11 years ago. His smoking use included cigarettes. He has a 40.00 pack-year smoking history. He quit smokeless tobacco use about 10 years ago. He reports current alcohol use of about 3.0 standard drinks of alcohol per week. He reports that he does not use drugs.  Allergies  Allergen Reactions  . Lisinopril Cough    Family History  Problem Relation Age of Onset  . Kidney disease Mother   . Stroke Father   . Breast cancer Sister        breast progressed into bone  . Colon cancer Neg Hx   . Prostate cancer Neg Hx   . Pancreatic cancer Neg Hx     Prior to Admission medications   Medication Sig Start Date End Date Taking? Authorizing Provider  amLODipine (NORVASC) 10 MG tablet TAKE 1 TABLET BY MOUTH DAILY 10/05/19   Adrian Prows, MD  atorvastatin (LIPITOR) 80 MG tablet TAKE 1 TABLET BY MOUTH DAILY AT 6 PM Patient taking differently: TAKE 1 TABLET BY MOUTH DAILY AT 6 PM 12/19/18   Miquel Dunn, NP  dexamethasone (DECADRON) 2 MG tablet Take 1 tablet (2 mg total) by mouth daily. 08/31/19   Ventura Sellers, MD  levETIRAcetam (KEPPRA) 500 MG tablet Take 1 tablet (500 mg total) by mouth 2 (two) times daily. 09/12/19   Ventura Sellers, MD  LORazepam (ATIVAN) 0.5 MG tablet 1 tab po q 4-6 hours prn or 1 tab po 30 minutes prior to MRI or radiation Patient not taking: Reported on 09/26/2019 07/12/19   Hayden Pedro, PA-C  losartan-hydrochlorothiazide (HYZAAR) 100-12.5 MG tablet Take 1 tablet by mouth daily. 10/04/19   Adrian Prows, MD  Magnesium Hydroxide (DULCOLAX PO) Take 1 tablet by mouth daily as needed (constipation).    [provider]  metoprolol succinate (TOPROL-XL) 25 MG 24 hr tablet TAKE 1 TABLET BY MOUTH DAILY Patient taking differently: Take 25 mg by mouth daily.  09/08/18   Miquel Dunn, NP  ondansetron (ZOFRAN) 8 MG tablet Take 8 mg by mouth 2 (two) times daily as needed. 09/11/19   [provider]  polyethylene glycol (MIRALAX / GLYCOLAX) 17 g packet Take 17 g by mouth daily.    [provider]  rivaroxaban (XARELTO) 20 MG TABS tablet Take 1 tablet (20 mg total) by mouth daily with supper. 07/22/19   Ventura Sellers, MD  temozolomide (TEMODAR) 140 MG capsule Take 1 capsule (140 mg total) by mouth daily. May take on an empty stomach to decrease nausea & vomiting. 08/08/19   Ventura Sellers, MD    Physical Exam: Vitals:  10/08/19 1930 10/08/19 1945 10/08/19 2000 10/08/19 2015  BP: (!) 152/78 (!) 146/79 (!) 144/83   Pulse: 69 79 70 65  Resp: 19  17 (!) 23  Temp:      TempSrc:      SpO2: 96% 93% 96% 96%  Weight:      Height:        Physical Exam Constitutional:      General: He is not in acute distress. HENT:     Head: Normocephalic.     Mouth/Throat:     Mouth: Mucous membranes are moist.  Eyes:     Extraocular Movements: Extraocular movements intact.     Pupils: Pupils are equal, round, and reactive to light.  Cardiovascular:     Rate and Rhythm: Normal rate and regular rhythm.     Pulses: Normal pulses.  Pulmonary:     Effort: Pulmonary effort is normal. No respiratory distress.     Breath sounds: Normal breath sounds. No wheezing or rales.  Abdominal:     General: Bowel sounds are normal. There is no distension.     Palpations: Abdomen is soft.     Tenderness: There is no abdominal tenderness. There is no guarding.  Musculoskeletal:        General: No swelling.     Cervical back: Neck supple.  Skin:    General: Skin is warm and dry.  Neurological:     Mental Status: He is alert and oriented to person, place, and time.     Comments: Speech fluent, tongue  midline, no facial droop Strength 3 out of 5 in the left upper extremity and 2 out of 5 in the left lower extremity.  Strength 5 out of 5 in the right upper and lower extremities.  Sensation to light touch intact throughout.     Labs on Admission: I have personally reviewed following labs and imaging studies  CBC: Recent Labs  Lab 10/08/19 1924  WBC 5.2  NEUTROABS 4.3  HGB 12.7*  HCT 37.8*  MCV 86.5  PLT 423   Basic Metabolic Panel: Recent Labs  Lab 10/08/19 1924  NA 132*  K 3.8  CL 96*  CO2 26  GLUCOSE 171*  BUN 16  CREATININE 0.88  CALCIUM 8.9   GFR: Estimated Creatinine Clearance: 94.2 mL/min (by C-G formula based on SCr of 0.88 mg/dL). Liver Function Tests: Recent Labs  Lab 10/08/19 1924  AST 20  ALT 29  ALKPHOS 73  BILITOT 1.5*  PROT 6.2*  ALBUMIN 3.5   No results for input(s): LIPASE, AMYLASE in the last 168 hours. No results for input(s): AMMONIA in the last 168 hours. Coagulation Profile: Recent Labs  Lab 10/08/19 1924  INR 1.0   Cardiac Enzymes: No results for input(s): CKTOTAL, CKMB, CKMBINDEX, TROPONINI in the last 168 hours. BNP (last 3 results) No results for input(s): PROBNP in the last 8760 hours. HbA1C: No results for input(s): HGBA1C in the last 72 hours. CBG: No results for input(s): GLUCAP in the last 168 hours. Lipid Profile: No results for input(s): CHOL, HDL, LDLCALC, TRIG, CHOLHDL, LDLDIRECT in the last 72 hours. Thyroid Function Tests: No results for input(s): TSH, T4TOTAL, FREET4, T3FREE, THYROIDAB in the last 72 hours. Anemia Panel: No results for input(s): VITAMINB12, FOLATE, FERRITIN, TIBC, IRON, RETICCTPCT in the last 72 hours. Urine analysis:    Component Value Date/Time   COLORURINE YELLOW 07/02/2019 1229   APPEARANCEUR CLEAR 07/02/2019 1229   LABSPEC 1.025 08/28/2019 1902   PHURINE 6.5 08/28/2019  [place] 08/28/2019 1902   HGBUR LARGE (A) 08/28/2019 1902   BILIRUBINUR NEGATIVE 08/28/2019 1902    KETONESUR NEGATIVE 08/28/2019 1902   PROTEINUR NEGATIVE 08/28/2019 1902   UROBILINOGEN 2.0 (H) 08/28/2019 1902   NITRITE NEGATIVE 08/28/2019 1902   LEUKOCYTESUR NEGATIVE 08/28/2019 1902    Radiological Exams on Admission: CT Head Wo Contrast  Result Date: 10/08/2019 CLINICAL DATA:  History of glioblastoma. Left arm weakness. Headache. Nausea. EXAM: CT HEAD WITHOUT CONTRAST TECHNIQUE: Contiguous axial images were obtained from the base of the skull through the vertex without intravenous contrast. COMPARISON:  Head MRI 07/22/2019 FINDINGS: Brain: Sequelae of right occipital craniotomy for glioblastoma debulking are again identified. There is minimal residual gas in the operative bed. There is new severe vasogenic edema extending from the right occipital lobe throughout the right temporal lobe, white matter tracts posterior to the basal ganglia, into the splenium of the corpus callosum, and into the right parietal lobe with regional sulcal effacement, subtotal effacement of the right lateral ventricle, and 8 mm of leftward midline shift. The left lateral ventricle is stable to at most minimally larger than on the prior MRI without evidence of frank hydrocephalus. Curvilinear hyperdensity along the superior aspect of and posterior to the third ventricle has density favoring calcification over hemorrhage, and no acute intracranial hemorrhage is evident elsewhere. There is an unchanged 1 cm dense calcification along the superomedial aspect of the left tentorium. No acute large territory cortically based infarct or extra-axial fluid collection is identified. Vascular: Calcified atherosclerosis at the skull base. Skull: Right occipital craniotomy. Sinuses/Orbits: Paranasal sinuses and mastoid air cells are clear. Unremarkable orbits. Other: None. IMPRESSION: 1. New severe vasogenic edema throughout the posterior right cerebral hemisphere with 8 mm of leftward midline shift. 2. No evidence of acute intracranial  abnormality. Electronically Signed   By: Logan Bores M.D.   On: 10/08/2019 20:20    EKG: Independently reviewed.  Sinus rhythm, RBBB, LAFB.  No significant change since prior tracing.  Assessment/Plan Principal Problem:   Vasogenic edema (HCC) Active Problems:   Diabetes mellitus without complication (HCC)   Essential hypertension   Left-sided weakness   Gross hematuria   Worsening left-sided weakness, severe headaches, and nausea/vomiting in setting of known right occipital glioblastoma with new severe vasogenic edema: Status post debulking resection in March 2021 and currently on chemo and radiation.  Followed by Dr. Mickeal [name] from neuro-oncology and the dose of his home Decadron was was tapered down during his recent office visit on 09/26/2019. Head CT showing new severe vasogenic edema throughout the posterior right cerebral hemisphere with 8 mm of leftward midline shift.  -Received IV Decadron 10 mg loading dose in the ED.  Continue IV Decadron 4 mg every 6 hours.  Continue Keppra for seizure prophylaxis.  Zofran as needed for nausea/vomiting.  Morphine as needed for severe headaches.  Frequent neurochecks, seizure precautions.   -No neuro-oncology available at night. ED provider discussed the case with Dr. Christella Noa from neurosurgery -no additional recommendations.  Please consult neuro-oncology in the morning.  Gross hematuria: Patient is currently on anticoagulation with Xarelto and had gross hematuria yesterday.  Wife showed me pictures on her phone.  Wife states today patient's urine has been clear.  Patient is not having any flank/abdominal pain and denies history of kidney stones. -Hold Xarelto.  Order urinalysis.  Chronic diastolic CHF: Currently euvolemic. -Continue to monitor volume status.  Diet controlled type 2 diabetes -Check A1c.  Sliding scale insulin ACHS as  he is on a high-dose steroid.  Hypertension -Continue home amlodipine, metoprolol  Hyperlipidemia -Continue home  Lipitor  Gait instability -PT and OT evaluation  DVT prophylaxis: SCDs Code Status: Patient wishes to be full code. Family Communication: Wife at bedside. Disposition Plan: Status is: Inpatient  Remains inpatient appropriate because:IV treatments appropriate due to intensity of illness or inability to take PO and Inpatient level of care appropriate due to severity of illness   Dispo: The patient is from: Home              Anticipated d/c is to: Home              Anticipated d/c date is: 2 days              Patient currently is not medically stable to d/c.  The medical decision making on this patient was of high complexity and the patient is at high risk for clinical deterioration, therefore this is a level 3 visit.  Shela Leff MD [facility]  If 7PM-7AM, please contact night-coverage [email or web address]  10/08/2019, 11:02 PM

## 2019-10-08 NOTE — ED Notes (Signed)
Transported to CT 

## 2019-10-08 NOTE — ED Provider Notes (Signed)
[facility]ERGENCY DEPARTMENT Provider Note   CSN: [id number] Arrival date & time: 10/08/19  [place]     History Chief Complaint  Patient presents with  . Weakness    [name] is a 74 y.o. male.  74 year old male with prior medical history as detailed below presents for evaluation of left arm and left leg weakness.  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  Patient reports that he was in his normal state of health yesterday.  He went to bed around 11 PM.  He reports that he woke up this morning and was more weak in the left arm and left leg.  He was unable to ambulate.  He arrives for evaluation of same.  He denies fever, nausea, vomiting, chest pain, shortness of breath, or other acute complaint.  He reports that he has been on Decadron.  He is not sure of his current dose.  He reports that his last dose of Xarelto was yesterday.  The history is provided by the patient and medical records.  Illness Location:  Left arm, left leg weakness, headache Severity:  Moderate Onset quality:  Gradual Duration:  1 day Timing:  Constant Progression:  Worsening Chronicity:  Recurrent Associated symptoms: no fever        Past Medical History:  Diagnosis Date  . Anxiety   . Arthritis   . ASCVD (arteriosclerotic cardiovascular disease)    03/2004: DES x2 to the RCA; IMI with stent occlusion in 02/2005- suboptimal Plavix compliance  . CAD (coronary artery disease)   . Carcinoma of prostate ([place])    Clopidogrel held for biopsy  . Constipation due to pain medication   . Diabetes mellitus without complication ([place])   . Diabetic neuropathy ([employer])   . Headache 2021  . Hyperlipidemia   . Hypertension   . Mild depression ([place])   . Morbid obesity ([place])   . Myocardial infarction ([place])   . Shortness of breath dyspnea   . Sleep apnea    no longer uses cpap  . Stroke ([place]) 2017  . Tobacco abuse    stopped smoking 06/28/09     Patient Active Problem List   Diagnosis Date Noted  . Goals of care, counseling/discussion 07/13/2019  . S/P craniotomy 07/04/2019  . 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 Respiratory failure ([place])   . Encephalopathy acute   . Brain mass 07/01/2019  . Fever 07/01/2019  . Atherosclerosis of native artery of both lower extremities with intermittent claudication ([place]) 08/23/2018  . Coronary artery disease without angina pectoris 08/23/2018  . Dyspnea on exertion 08/23/2018  . Biochemically recurrent malignant neoplasm of prostate 08/16/2018  . S/P carotid endarterectomy 08/20/2015  . Essential hypertension 08/20/2015  . Type 2 diabetes mellitus with circulatory disorder ([place]) 08/20/2015  . HLD (hyperlipidemia) 08/20/2015  . Carotid artery stenosis, symptomatic 07/03/2015  . Carotid stenosis, right   . TIA (transient ischemic attack) 06/18/2015  . ASCVD (arteriosclerotic cardiovascular disease) 06/18/2015  . Diabetes mellitus without complication ([place]) 26/20/3559  . Resistant hypertension 06/18/2015  . Hyperlipidemia 06/18/2015  . Spinal stenosis of lumbar region with neurogenic claudication 09/06/2014  . Atherosclerosis of native artery of extremity with intermittent claudication ([place]) 03/09/2013  . HYPERLIPIDEMIA 04/08/2009  . TOBACCO ABUSE 04/08/2009  . ATHEROSCLEROTIC CARDIOVASCULAR DISEASE 04/08/2009  . INTERMITTENT VERTIGO 04/08/2009  . ADENOCARCINOMA, PROSTATE 03/21/2009  . ABDOMINAL PAIN -GENERALIZED 03/21/2009  . History of coronary artery bypass graft 06/28/2008  Past Surgical History:  Procedure Laterality Date  . APPLICATION OF CRANIAL NAVIGATION N/A 07/04/2019   Procedure: APPLICATION OF CRANIAL NAVIGATION;  Surgeon: Ashok Pall, MD;  Location: [facility];  Service: Neurosurgery;  Laterality: N/A;  . BACK SURGERY    . CARDIAC CATHETERIZATION     X 1 stent before having CABG  . CORONARY ARTERY BYPASS GRAFT  06/28/2008   X4   . CRANIOTOMY Right 07/04/2019   Procedure: Right occipital craniotomy for tumor with brainlab;  Surgeon: Ashok Pall, MD;  Location: [facility];  Service: Neurosurgery;  Laterality: Right;  . ENDARTERECTOMY Right 07/03/2015   Procedure: RIGHT CAROTID ENDARTERECTOMY WITH BOVINE PERICARDIUM PATCH ANGIOPLASTY;  Surgeon: Serafina Mitchell, MD;  Location: [facility];  Service: Vascular;  Laterality: Right;  . LUMBAR LAMINECTOMY/DECOMPRESSION MICRODISCECTOMY N/A 09/06/2014   Procedure: LUMBER DECOMPRESSION L3-5;  Surgeon: Melina Schools, MD;  Location: [facility];  Service: Orthopedics;  Laterality: N/A;  . LUMBAR SPINE SURGERY  2002  . PROSTATE BIOPSY    . PROSTATECTOMY  02/2007  . VASECTOMY         Family History  Problem Relation Age of Onset  . Kidney disease Mother   . Stroke Father   . Breast cancer Sister        breast progressed into bone  . Colon cancer Neg Hx   . Prostate cancer Neg Hx   . Pancreatic cancer Neg Hx     Social History   Tobacco Use  . Smoking status: Former Smoker    Packs/day: 1.00    Years: 40.00    Pack years: 40.00    Types: Cigarettes    Quit date: 06/28/2008    Years since quitting: 11.2  . Smokeless tobacco: Former Systems developer    Quit date: 06/28/2009  Vaping Use  . Vaping Use: Never used  Substance Use Topics  . Alcohol use: Yes    Alcohol/week: 3.0 standard drinks    Types: 2 Glasses of wine, 1 Cans of beer per week    Comment: may have 1 drink a week  . Drug use: No    Home Medications Prior to Admission medications   Medication Sig Start Date End Date Taking? Authorizing Provider  amLODipine (NORVASC) 10 MG tablet TAKE 1 TABLET BY MOUTH DAILY 10/05/19   Adrian Prows, MD  atorvastatin (LIPITOR) 80 MG tablet TAKE 1 TABLET BY MOUTH DAILY AT 6 PM Patient taking differently: TAKE 1 TABLET BY MOUTH DAILY AT 6 PM 12/19/18   Miquel Dunn, NP  dexamethasone (DECADRON) 2 MG tablet Take 1 tablet (2 mg total) by mouth daily. 08/31/19   Ventura Sellers, MD   levETIRAcetam (KEPPRA) 500 MG tablet Take 1 tablet (500 mg total) by mouth 2 (two) times daily. 09/12/19   Ventura Sellers, MD  LORazepam (ATIVAN) 0.5 MG tablet 1 tab po q 4-6 hours prn or 1 tab po 30 minutes prior to MRI or radiation Patient not taking: Reported on 09/26/2019 07/12/19   Hayden Pedro, PA-C  losartan-hydrochlorothiazide (HYZAAR) 100-12.5 MG tablet Take 1 tablet by mouth daily. 10/04/19   Adrian Prows, MD  Magnesium Hydroxide (DULCOLAX PO) Take 1 tablet by mouth daily as needed (constipation).    [provider]  metoprolol succinate (TOPROL-XL) 25 MG 24 hr tablet TAKE 1 TABLET BY MOUTH DAILY Patient taking differently: Take 25 mg by mouth daily.  09/08/18   Miquel Dunn, NP  ondansetron (ZOFRAN) 8 MG tablet Take 8 mg by mouth 2 (two) times  daily as needed. 09/11/19   [provider]  polyethylene glycol (MIRALAX / GLYCOLAX) 17 g packet Take 17 g by mouth daily.    [provider]  rivaroxaban (XARELTO) 20 MG TABS tablet Take 1 tablet (20 mg total) by mouth daily with supper. 07/22/19   Ventura Sellers, MD  temozolomide (TEMODAR) 140 MG capsule Take 1 capsule (140 mg total) by mouth daily. May take on an empty stomach to decrease nausea & vomiting. 08/08/19   Ventura Sellers, MD    Allergies    Lisinopril  Review of Systems   Review of Systems  Constitutional: Negative for fever.  All other systems reviewed and are negative.   Physical Exam Updated Vital Signs BP 134/75 (BP Location: Right Arm)   Pulse 78   Temp 98.2 F (36.8 C) (Oral)   Resp 13   Ht 6\' 2"  (1.88 m)   Wt 99.5 kg   SpO2 95%   BMI 28.16 kg/m   Physical Exam Vitals and nursing note reviewed.  Constitutional:      General: He is not in acute distress.    Appearance: Normal appearance. He is well-developed.  HENT:     Head: Normocephalic and atraumatic.  Eyes:     Conjunctiva/sclera: Conjunctivae normal.     Pupils: Pupils are equal, round, and reactive to  light.  Cardiovascular:     Rate and Rhythm: Normal rate and regular rhythm.     Heart sounds: Normal heart sounds.  Pulmonary:     Effort: Pulmonary effort is normal. No respiratory distress.     Breath sounds: Normal breath sounds.  Abdominal:     General: There is no distension.     Palpations: Abdomen is soft.     Tenderness: There is no abdominal tenderness.  Musculoskeletal:        General: No deformity. Normal range of motion.     Cervical back: Normal range of motion and neck supple.  Skin:    General: Skin is warm and dry.  Neurological:     Mental Status: He is alert and oriented to person, place, and time.     Motor: Weakness present.     Comments: AOX4 Normal speech No Facial droop  3/5 strength in left arm and left leg.     ED Results / Procedures / Treatments   Labs (all labs ordered are listed, but only abnormal results are displayed) Labs Reviewed  COMPREHENSIVE METABOLIC PANEL - Abnormal; Notable for the following components:      Result Value   Sodium 132 (*)    Chloride 96 (*)    Glucose, Bld 171 (*)    Total Protein 6.2 (*)    Total Bilirubin 1.5 (*)    All other components within normal limits  CBC WITH DIFFERENTIAL/PLATELET - Abnormal; Notable for the following components:   Hemoglobin 12.7 (*)    HCT 37.8 (*)    Lymphs Abs 0.3 (*)    All other components within normal limits  SARS CORONAVIRUS 2 BY RT PCR (HOSPITAL ORDER, [facility])  PROTIME-INR  URINALYSIS, ROUTINE W REFLEX MICROSCOPIC  TYPE AND SCREEN    EKG None  Radiology CT Head Wo Contrast  Result Date: 10/08/2019 CLINICAL DATA:  History of glioblastoma. Left arm weakness. Headache. Nausea. EXAM: CT HEAD WITHOUT CONTRAST TECHNIQUE: Contiguous axial images were obtained from the base of the skull through the vertex without intravenous contrast. COMPARISON:  Head MRI 07/22/2019 FINDINGS: Brain: Sequelae  of right occipital craniotomy for glioblastoma  debulking are again identified. There is minimal residual gas in the operative bed. There is new severe vasogenic edema extending from the right occipital lobe throughout the right temporal lobe, white matter tracts posterior to the basal ganglia, into the splenium of the corpus callosum, and into the right parietal lobe with regional sulcal effacement, subtotal effacement of the right lateral ventricle, and 8 mm of leftward midline shift. The left lateral ventricle is stable to at most minimally larger than on the prior MRI without evidence of frank hydrocephalus. Curvilinear hyperdensity along the superior aspect of and posterior to the third ventricle has density favoring calcification over hemorrhage, and no acute intracranial hemorrhage is evident elsewhere. There is an unchanged 1 cm dense calcification along the superomedial aspect of the left tentorium. No acute large territory cortically based infarct or extra-axial fluid collection is identified. Vascular: Calcified atherosclerosis at the skull base. Skull: Right occipital craniotomy. Sinuses/Orbits: Paranasal sinuses and mastoid air cells are clear. Unremarkable orbits. Other: None. IMPRESSION: 1. New severe vasogenic edema throughout the posterior right cerebral hemisphere with 8 mm of leftward midline shift. 2. No evidence of acute intracranial abnormality. Electronically Signed   By: Logan Bores M.D.   On: 10/08/2019 20:20    Procedures Procedures (including critical care time)  Medications Ordered in ED Medications - No data to display  ED Course  I have reviewed the triage vital signs and the nursing notes.  Pertinent labs & imaging results that were available during my care of the patient were reviewed by me and considered in my medical decision making (see chart for details).    MDM Rules/Calculators/A&P                          MDM  Screen complete  WALEED DETTMAN was evaluated in Emergency Department on 10/08/2019 for the  symptoms described in the history of present illness. He was evaluated in the context of the global COVID-19 pandemic, which necessitated consideration that the patient might be at risk for infection with the SARS-CoV-2 virus that causes COVID-19. Institutional protocols and algorithms that pertain to the evaluation of patients at risk for COVID-19 are in a state of rapid change based on information released by regulatory bodies including the CDC and federal and state organizations. These policies and algorithms were followed during the patient's care in the ED.  Patient is presenting for evaluation of headache, left arm, and left leg weakness.  Patient recently tapered on his Decadron.  CT imaging reveals vasogenic edema.  Case briefly discussed with neurosurgery - Dr. Christella Noa - who agrees with Decadron load.  Patient will be admitted to the hospital service for further work-up and treatment.    Final Clinical Impression(s) / ED Diagnoses Final diagnoses:  Weakness    Rx / DC Orders ED Discharge Orders    None       Valarie Merino, MD 10/08/19 2206

## 2019-10-09 ENCOUNTER — Other Ambulatory Visit: Payer: Medicare Other

## 2019-10-09 DIAGNOSIS — Z515 Encounter for palliative care: Secondary | ICD-10-CM

## 2019-10-09 DIAGNOSIS — G936 Cerebral edema: Principal | ICD-10-CM

## 2019-10-09 DIAGNOSIS — Z7189 Other specified counseling: Secondary | ICD-10-CM

## 2019-10-09 LAB — GLUCOSE, CAPILLARY
Glucose-Capillary: 204 mg/dL — ABNORMAL HIGH (ref 70–99)
Glucose-Capillary: 156 mg/dL — ABNORMAL HIGH (ref 70–99)
Glucose-Capillary: 216 mg/dL — ABNORMAL HIGH (ref 70–99)
Glucose-Capillary: 184 mg/dL — ABNORMAL HIGH (ref 70–99)
Glucose-Capillary: 172 mg/dL — ABNORMAL HIGH (ref 70–99)

## 2019-10-09 LAB — URINALYSIS, COMPLETE (UACMP) WITH MICROSCOPIC
Bilirubin Urine: NEGATIVE
Glucose, UA: NEGATIVE mg/dL
Hgb urine dipstick: NEGATIVE
Ketones, ur: NEGATIVE mg/dL
Leukocytes,Ua: NEGATIVE
Nitrite: NEGATIVE
Protein, ur: 30 mg/dL — AB
Specific Gravity, Urine: 1.018 (ref 1.005–1.030)
pH: 7 (ref 5.0–8.0)

## 2019-10-09 MED ORDER — INSULIN DETEMIR 100 UNIT/ML ~~LOC~~ SOLN
5.0000 [IU] | Freq: Two times a day (BID) | SUBCUTANEOUS | Status: DC
  Administered 2019-10-09 – 2019-10-11 (×5): 5 [IU] via SUBCUTANEOUS
  Filled 2019-10-09 (×7): qty 0.05

## 2019-10-09 NOTE — Consult Note (Signed)
Consultation Note Date: 10/09/2019   Patient Name: [name]  DOB: [birth year]  MRN: [id number]  Age / Sex: 74 y.o., male  PCP: Maurice Small, MD Referring Physician: Aileen Fass, Tammi Klippel, MD  Reason for Consultation: Establishing goals of care and Psychosocial/spiritual support  HPI/Patient Profile: 74 y.o. male   admitted on 10/08/2019 with past medical history significant for  right occipital glioblastoma status post debulking surgery in 07/08/2019 status post chemo and radiation therapy, also past medical history of prostate cancer status post radiation, CAD, chronic diastolic heart failure, type 2 diabetes mellitus, essential hypertension comes into the ED with headache nausea vomiting and worsening left-sided weakness.   Radiation completed 09/28/2019.  Patient lives at home with his wife.   CT of the head showed new vasogenic edema in the posterior cerebral hemispheres with an 8 mm left ward midline shift   Currently patient has no complaints of headache or nausea.  Patient faces treatment option decisions, advanced directive decisions and anticipatory care needs.   Clinical Assessment and Goals of Care:  This NP Wadie Lessen reviewed medical records, received report from team, assessed the patient and then meet at the patient's bedside with his wife to discuss diagnosis, GOC, disposition and options.   Concept of Palliative Care was introduced as specialized medical care for people and their families living with serious illness.  If focuses on providing relief from the symptoms and stress of a serious illness.  The goal is to improve quality of life for both the patient and the family.  Created space and opportunity for patient and his wife to explore their thoughts and feelings regarding current medical situation.   A  discussion was had today regarding advanced directives.  Concepts specific to  code status, artifical feeding and hydration, continued IV antibiotics and rehospitalization was had.      Values and goals of care important to patient and family were attempted to be elicited.  Education with patient the importance of continued conversation with his  family and the medical providers regarding overall plan of care and treatment options,  ensuring decisions are within the context of the patients values and GOCs.  Questions and concerns addressed.  Patient  encouraged to call with questions or concerns.     PMT will continue to support holistically.     No documented healthcare power of attorney or advanced directive.  Offered patient the opportunity to complete ways here in the hospital with the assistance of our spiritual care department.      SUMMARY OF RECOMMENDATIONS    Code Status/Advance Care Planning:  Full code   Palliative Prophylaxis:   Delirium Protocol and Frequent Pain Assessment  Additional Recommendations (Limitations, Scope, Preferences):  Full Scope Treatment   Patient is open to all offered and available medical interventions to prolong life. He hopes for continued quality  Life.  Patient hopes to talk with Dr. Marin Comment oncologist today for more information regarding treatment options and outcomes prior to making advanced directive decisions..   Prognosis:  Unable to determine  Discharge Planning: CIR mentioned by OT  To Be Determined     Primary Diagnoses: Present on Admission: . Essential hypertension   I have reviewed the medical record, interviewed the patient and family, and examined the patient. The following aspects are pertinent.  Past Medical History:  Diagnosis Date  . Anxiety   . Arthritis   . ASCVD (arteriosclerotic cardiovascular disease)    03/2004: DES x2 to the RCA; IMI with stent occlusion in 02/2005- suboptimal Plavix compliance  . CAD (coronary artery disease)   . Carcinoma of prostate ([place])     Clopidogrel held for biopsy  . Constipation due to pain medication   . Diabetes mellitus without complication ([place])   . Diabetic neuropathy ([place])   . Headache 2021  . Hyperlipidemia   . Hypertension   . Mild depression ([place])   . Morbid obesity ([place])   . Myocardial infarction ([place])   . Shortness of breath dyspnea   . Sleep apnea    no longer uses cpap  . Stroke ([place]) 2017  . Tobacco abuse    stopped smoking 06/28/09   Social History   Socioeconomic History  . Marital status: Married    Spouse name: Not on file  . Number of children: 4  . Years of education: Not on file  . Highest education level: Not on file  Occupational History  . Occupation: Technical brewer    Comment: retired  . Occupation: Designer, industrial/product: [employer]    Comment: full time  Tobacco Use  . Smoking status: Former Smoker    Packs/day: 1.00    Years: 40.00    Pack years: 40.00    Types: Cigarettes    Quit date: 06/28/2008    Years since quitting: 11.2  . Smokeless tobacco: Former Systems developer    Quit date: 06/28/2009  Vaping Use  . Vaping Use: Never used  Substance and Sexual Activity  . Alcohol use: Yes    Alcohol/week: 3.0 standard drinks    Types: 2 Glasses of wine, 1 Cans of beer per week    Comment: may have 1 drink a week  . Drug use: No  . Sexual activity: Not on file  Other Topics Concern  . Not on file  Social History Narrative   Married with 3 sons and 1 daughter   Daily caffeine use, 3 per day   Social Determinants of Health   Financial Resource Strain:   . Difficulty of Paying Living Expenses:   Food Insecurity:   . Worried About Charity fundraiser in the Last Year:   . Arboriculturist in the Last Year:   Transportation Needs:   . Film/video editor (Medical):   Marland Kitchen Lack of Transportation (Non-Medical):   Physical Activity:   . Days of Exercise per Week:   . Minutes of Exercise per Session:   Stress:   . Feeling of Stress :   Social Connections:   .  Frequency of Communication with Friends and Family:   . Frequency of Social Gatherings with Friends and Family:   . Attends Religious Services:   . Active Member of Clubs or Organizations:   . Attends Archivist Meetings:   Marland Kitchen Marital Status:    Family History  Problem Relation Age of Onset  . Kidney disease Mother   . Stroke Father   . Breast cancer Sister        breast progressed into  bone  . Colon cancer Neg Hx   . Prostate cancer Neg Hx   . Pancreatic cancer Neg Hx    Scheduled Meds: . amLODipine  10 mg Oral QPM  . atorvastatin  80 mg Oral QHS  . dexamethasone (DECADRON) injection  4 mg Intravenous Q6H  . insulin aspart  0-15 Units Subcutaneous TID WC  . insulin aspart  0-5 Units Subcutaneous QHS  . insulin detemir  5 Units Subcutaneous BID  . levETIRAcetam  500 mg Oral BID  . metoprolol succinate  25 mg Oral QPM   Continuous Infusions: PRN Meds:.acetaminophen **OR** acetaminophen, morphine injection, ondansetron (ZOFRAN) IV, polyethylene glycol Medications Prior to Admission:  Prior to Admission medications   Medication Sig Start Date End Date Taking? Authorizing Provider  acetaminophen (TYLENOL) 500 MG tablet Take 1,000 mg by mouth every 6 (six) hours as needed for headache (pain).   Yes [provider]  amLODipine (NORVASC) 10 MG tablet TAKE 1 TABLET BY MOUTH DAILY Patient taking differently: Take 10 mg by mouth every evening.  10/05/19  Yes Adrian Prows, MD  atorvastatin (LIPITOR) 80 MG tablet TAKE 1 TABLET BY MOUTH DAILY AT 6 PM Patient taking differently: Take 80 mg by mouth at bedtime.  12/19/18  Yes Miquel Dunn, NP  dexamethasone (DECADRON) 2 MG tablet Take 1 tablet (2 mg total) by mouth daily. Patient taking differently: Take 3 mg by mouth See admin instructions. Take 1 1/2 tablets (3 mg) by mouth daily, taper down to 1 tablet (2 mg) daily on 10/12/2019, then call [name] for further instructions 08/31/19  Yes Vaslow, Acey Lav, MD    levETIRAcetam (KEPPRA) 500 MG tablet Take 1 tablet (500 mg total) by mouth 2 (two) times daily. 09/12/19  Yes Vaslow, Acey Lav, MD  losartan-hydrochlorothiazide (HYZAAR) 100-12.5 MG tablet Take 1 tablet by mouth daily. Patient taking differently: Take 1 tablet by mouth every evening.  10/04/19  Yes Adrian Prows, MD  metoprolol succinate (TOPROL-XL) 25 MG 24 hr tablet TAKE 1 TABLET BY MOUTH DAILY Patient taking differently: Take 25 mg by mouth every evening.  09/08/18  Yes Miquel Dunn, NP  polyethylene glycol (MIRALAX / GLYCOLAX) 17 g packet Take 17 g by mouth daily as needed (constipation).    Yes [provider]  LORazepam (ATIVAN) 0.5 MG tablet 1 tab po q 4-6 hours prn or 1 tab po 30 minutes prior to MRI or radiation Patient not taking: Reported on 09/26/2019 07/12/19   Hayden Pedro, PA-C  rivaroxaban (XARELTO) 20 MG TABS tablet Take 1 tablet (20 mg total) by mouth daily with supper. 07/22/19   Ventura Sellers, MD   Allergies  Allergen Reactions  . Lisinopril Cough   Review of Systems  Constitutional: Positive for fatigue.    Physical Exam Cardiovascular:     Rate and Rhythm: Normal rate.  Pulmonary:     Effort: Pulmonary effort is normal.  Skin:    General: Skin is warm and dry.  Neurological:     Mental Status: He is alert.     Vital Signs: BP 118/65   Pulse (!) 54   Temp 98.1 F (36.7 C) (Oral)   Resp 10   Ht 6\' 2"  (1.88 m)   Wt 99.5 kg   SpO2 94%   BMI 28.16 kg/m  Pain Scale: 0-10   Pain Score: 0-No pain   SpO2: SpO2: 94 % O2 Device:SpO2: 94 % O2 Flow Rate: .   IO: Intake/output summary:   Intake/Output  Summary (Last 24 hours) at 10/09/2019 1029 Last data filed at 10/09/2019 5885 Gross per 24 hour  Intake --  Output 375 ml  Net -375 ml    LBM: Last BM Date: 10/08/19 Baseline Weight: Weight: 99.5 kg Most recent weight: Weight: 99.5 kg     Palliative Assessment/Data:    Discussed with Dr Venetia Constable  Time In: 0830 Time Out:  0945 Time Total: 75 minutes  Greater than 50%  of this time was spent counseling and coordinating care related to the above assessment and plan.  Signed by: Wadie Lessen, NP   Please contact Palliative Medicine Team phone at [phone] for questions and concerns.  For individual provider: See Shea Evans

## 2019-10-09 NOTE — Progress Notes (Signed)
Occupational Therapy Evaluation Patient Details Name: [name] MRN: [id number] DOB: [birth year] Today's Date: 10/09/2019    History of Present Illness 74 y.o. 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  CT of the head showed new severe vasogenic edema extending from the right occipital lobe throughout the right temporal lobe with an 8 mm leftward midline shift.    Clinical Impression   Patient lives at home with wife in a multi level house.  Patient has been walking with a cane and requiring assist with ADLs due to recent L visual field cut.  Today patient demonstrating some L inattention.  He requires increased verbal cueing to move L arm and use it for tasks.  States sometimes he sees L hand and is not aware it is his own hand.  When cued patient is able to move L arm and demonstrates 3-/5 strength in shoulder and 3+/5 in rest of arm.  He requires min assist with seated UB ADLs and mod assist with standing LB ADLs.  Able to stand with min guard and RW.  Patient peresented with poor safety awareness, problem solving and processing.  Will continue to follow with OT acutely to address the deficits listed below.      Follow Up Recommendations  CIR;Supervision/Assistance - 24 hour    Equipment Recommendations  Other (comment) (defer to next venue)    Recommendations for Other Services       Precautions / Restrictions Precautions Precautions: Fall;Other (comment) Precaution Comments: some L inattention Restrictions Weight Bearing Restrictions: No      Mobility Bed Mobility               General bed mobility comments: OOB in  chair  Transfers Overall transfer level: Needs assistance Equipment used: Rolling walker (2 wheeled) Transfers: Sit to/from Stand Sit to Stand: Min guard              Balance Overall balance assessment: Needs assistance Sitting-balance support: Bilateral upper extremity supported;Feet supported Sitting balance-Leahy Scale: Poor   Postural control: Posterior lean Standing balance support: Bilateral upper extremity supported Standing balance-Leahy Scale: Poor                             ADL either performed or assessed with clinical judgement   ADL Overall ADL's : Needs assistance/impaired Eating/Feeding: Set up;Sitting   Grooming: Set up;Sitting   Upper Body Bathing: Minimal assistance;Sitting   Lower Body Bathing: Moderate assistance;Sit to/from stand   Upper Body Dressing : Minimal assistance;Sitting   Lower Body Dressing: Moderate assistance;Sit to/from stand   Toilet Transfer: Minimal assistance;RW;BSC           Functional mobility during ADLs: Minimal assistance;Rolling walker General ADL Comments: Requires increased time and cueing for moving L UE.     Vision Baseline Vision/History:  (L homonymous hemianopsia - from 06/2019) Patient Visual Report: No change from baseline Vision Assessment?: Yes Eye Alignment: Within Functional Limits Ocular Range of Motion: Within Functional Limits Alignment/Gaze Preference: Within Defined Limits Tracking/Visual Pursuits: Able to track stimulus in all quads without difficulty Visual Fields: Left homonymous hemianopsia     Perception     Praxis  Pertinent Vitals/Pain Pain Assessment: No/denies pain     Hand Dominance Right   Extremity/Trunk Assessment Upper Extremity Assessment Upper Extremity Assessment: Overall WFL for tasks assessed;LUE deficits/detail LUE Deficits / Details: Shoulder 3-/5 strength, rest of arm/hand 3+/5.  Less awareness of L UE.  Poor coordination, finger to nose. LUE  Sensation: decreased proprioception LUE Coordination: decreased fine motor;decreased gross motor   Lower Extremity Assessment Lower Extremity Assessment: Defer to PT evaluation       Communication Communication Communication: No difficulties   Cognition Arousal/Alertness: Awake/alert Behavior During Therapy: WFL for tasks assessed/performed Overall Cognitive Status: Impaired/Different from baseline Area of Impairment: Awareness;Safety/judgement;Problem solving                         Safety/Judgement: Decreased awareness of safety;Decreased awareness of deficits Awareness: Emergent Problem Solving: Difficulty sequencing;Requires verbal cues General Comments: Verbals cues to move/be aware of L side.  Increased processing time.   General Comments       Exercises     Shoulder Instructions      Home Living Family/patient expects to be discharged to:: Private residence Living Arrangements: Spouse/significant other Available Help at Discharge: Family;Available 24 hours/day Type of Home: House Home Access: Stairs to enter [employer] of Steps: 2 Entrance Stairs-Rails: Right Home Layout: Two level;Bed/bath upstairs Alternate Level Stairs-Number of Steps: 13 Alternate Level Stairs-Rails: Right;Left;Can reach both Bathroom Shower/Tub: Walk-in Hydrologist: Handicapped height Bathroom Accessibility: Yes   Home Equipment: Cane - single point          Prior Functioning/Environment Level of Independence: Needs assistance  Gait / Transfers Assistance Needed: getting OPPT at neuro[facility]hab working on balance; using cane; had a fall last week when slipped off edge of chair ADL's / Homemaking Assistance Needed: needed assist with dressing due to vision loss (going to OPOT at neurorehab)            OT Problem List: Decreased strength;Decreased range of motion;Decreased activity tolerance;Impaired balance (sitting and/or standing);Impaired  vision/perception;Decreased coordination;Decreased cognition;Decreased safety awareness;Impaired UE functional use      OT Treatment/Interventions: Self-care/ADL training;Therapeutic exercise;Neuromuscular education;Energy conservation;Therapeutic activities;Cognitive remediation/compensation;Balance training;Patient/family education;Visual/perceptual remediation/compensation    OT Goals(Current goals can be found in the care plan section) Acute Rehab OT Goals Patient Stated Goal: return home when safe OT Goal Formulation: With patient Time For Goal Achievement: 10/23/19 Potential to Achieve Goals: Good  OT Frequency: Min 2X/week   Barriers to D/C:            Co-evaluation              AM-PAC OT "6 Clicks" Daily Activity     Outcome Measure Help from another person eating meals?: A Little Help from another person taking care of personal grooming?: A Little Help from another person toileting, which includes using toliet, bedpan, or urinal?: A Lot Help from another person bathing (including washing, rinsing, drying)?: A Lot Help from another person to put on and taking off regular upper body clothing?: A Little Help from another person to put on and taking off regular lower body clothing?: A Lot 6 Click Score: 15   End of Session Equipment Utilized During Treatment: Rolling walker Nurse Communication: Mobility status  Activity Tolerance: Patient tolerated treatment well Patient left: in chair;with call bell/phone within reach;with chair alarm set  OT Visit Diagnosis: Unsteadiness on feet (R26.81);Other abnormalities of gait and mobility (R26.89);Other symptoms and signs involving the nervous system (R29.898);Other symptoms and signs involving cognitive  function;Muscle weakness (generalized) (M62.81);History of falling (Z91.81);Hemiplegia and hemiparesis Hemiplegia - Right/Left: Left Hemiplegia - dominant/non-dominant: Non-Dominant                Time: 3818-2993 OT Time  Calculation (min): 24 min Charges:  OT General Charges $OT Visit: 1 Visit OT Evaluation $OT Eval Moderate Complexity: 1 Mod OT Treatments $Therapeutic Activity: 8-22 mins  August Luz, OTR/L   Phylliss Bob 10/09/2019, 2:47 PM

## 2019-10-09 NOTE — Progress Notes (Signed)
 [facility] [address]  [place], [place] [address] [phone]   Inpatient Evaluation  Date of Service: 09/26/19 Patient Name: [name] Patient MRN: [id number] Patient DOB: 01/27/1946 Provider:  K , MD  Identifying Statement:  [name] is a 73 y.o. 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 Cabbell; path demonstrates Glioblastoma   08/17/2019 -  Radiation Therapy   IMRT with concurrent daily Temozolomide 75mg/m2     Biomarkers:  MGMT Unmethylated.  IDH 1/2 Wild type.  EGFR Not expressed  TERT Unknown   Interval History:  [name] presented to the ED with progressive worsening of headaches, nausea, vomiting and left sided weakness.  He underwent CT head and was admitted to medicine service after dosing IV decadron.  Today he already feels considerably improved compared to admission date.  He is still struggling to remember to use his left side.  No recurrence of blood in urine since xarelto was put on hold Saturday.  H+P (07/13/19) Patient presented in early March 2021 with several days of progressive left sided visual impairment.  He noticed "missing things" in the left side of his field in his work and while driving.  He then developed headaches which were new for him.  Never complained of difficulty walking, speaking, using arms or hands.  CNS imaging demonstrated tumor within right occipital lobe, which was biopsied by Dr. Cabbell on 07/04/19.  Following surgery, he has no new complaints or progressive deficits.  He may be taking decadron 4mg twice per day.  Otherwise his functional status is only limited by visual impairment. 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  Medications: No current facility-administered medications on  file prior to encounter.   Current Outpatient Medications on File Prior to Encounter  Medication Sig Dispense Refill  . acetaminophen (TYLENOL) 500 MG tablet Take 1,000 mg by mouth every 6 (six) hours as needed for headache (pain).    . amLODipine (NORVASC) 10 MG tablet TAKE 1 TABLET BY MOUTH DAILY (Patient taking differently: Take 10 mg by mouth every evening. ) 90 tablet 0  . atorvastatin (LIPITOR) 80 MG tablet TAKE 1 TABLET BY MOUTH DAILY AT 6 PM (Patient taking differently: Take 80 mg by mouth at bedtime. ) 90 tablet 1  . dexamethasone (DECADRON) 2 MG tablet Take 1 tablet (2 mg total) by mouth daily. (Patient taking differently: Take 3 mg by mouth See admin instructions. Take 1 1/2 tablets (3 mg) by mouth daily, taper down to 1 tablet (2 mg) daily on 10/12/2019, then call Dr.  for further instructions) 30 tablet 0  . levETIRAcetam (KEPPRA) 500 MG tablet Take 1 tablet (500 mg total) by mouth 2 (two) times daily. 60 tablet 3  . losartan-hydrochlorothiazide (HYZAAR) 100-12.5 MG tablet Take 1 tablet by mouth daily. (Patient taking differently: Take 1 tablet by mouth every evening. ) 90 tablet 2  . metoprolol succinate (TOPROL-XL) 25 MG 24 hr tablet TAKE 1 TABLET BY MOUTH DAILY (Patient taking differently: Take 25 mg by mouth every evening. ) 90 tablet 1  . polyethylene glycol (MIRALAX / GLYCOLAX) 17 g packet Take 17 g by mouth daily as needed (constipation).     . LORazepam (ATIVAN) 0.5 MG tablet 1 tab po q 4-6 hours prn or 1 tab po 30 minutes   prior to MRI or radiation (Patient not taking: Reported on 09/26/2019) 30 tablet 0  . rivaroxaban (XARELTO) 20 MG TABS tablet Take 1 tablet (20 mg total) by mouth daily with supper. 30 tablet 3    Allergies:  Allergies  Allergen Reactions  . Lisinopril Cough   Past Medical History:  Past Medical History:  Diagnosis Date  . Anxiety   . Arthritis   . ASCVD (arteriosclerotic cardiovascular disease)    03/2004: DES x2 to the RCA; IMI with stent  occlusion in 02/2005- suboptimal Plavix compliance  . CAD (coronary artery disease)   . Carcinoma of prostate (HCC)    Clopidogrel held for biopsy  . Constipation due to pain medication   . Diabetes mellitus without complication (HCC)   . Diabetic neuropathy (HCC)   . Headache 2021  . Hyperlipidemia   . Hypertension   . Mild depression (HCC)   . Morbid obesity (HCC)   . Myocardial infarction (HCC)   . Shortness of breath dyspnea   . Sleep apnea    no longer uses cpap  . Stroke (HCC) 2017  . Tobacco abuse    stopped smoking 06/28/09   Past Surgical History:  Past Surgical History:  Procedure Laterality Date  . APPLICATION OF CRANIAL NAVIGATION N/A 07/04/2019   Procedure: APPLICATION OF CRANIAL NAVIGATION;  Surgeon: Cabbell, Kyle, MD;  Location: [facility] OR;  Service: Neurosurgery;  Laterality: N/A;  . BACK SURGERY    . CARDIAC CATHETERIZATION     X 1 stent before having CABG  . CORONARY ARTERY BYPASS GRAFT  06/28/2008   X4  . CRANIOTOMY Right 07/04/2019   Procedure: Right occipital craniotomy for tumor with brainlab;  Surgeon: Cabbell, Kyle, MD;  Location: [facility] OR;  Service: Neurosurgery;  Laterality: Right;  . ENDARTERECTOMY Right 07/03/2015   Procedure: RIGHT CAROTID ENDARTERECTOMY WITH BOVINE PERICARDIUM PATCH ANGIOPLASTY;  Surgeon: Vance W Brabham, MD;  Location: [facility] OR;  Service: Vascular;  Laterality: Right;  . LUMBAR LAMINECTOMY/DECOMPRESSION MICRODISCECTOMY N/A 09/06/2014   Procedure: LUMBER DECOMPRESSION L3-5;  Surgeon: Dahari Brooks, MD;  Location: [facility] OR;  Service: Orthopedics;  Laterality: N/A;  . LUMBAR SPINE SURGERY  2002  . PROSTATE BIOPSY    . PROSTATECTOMY  02/2007  . VASECTOMY     Social History:  Social History   Socioeconomic History  . Marital status: Married    Spouse name: Not on file  . Number of children: 4  . Years of education: Not on file  . Highest education level: Not on file  Occupational History  . Occupation: Hospital director    Comment: retired    . Occupation: counter sales    Employer: [employer]    Comment: full time  Tobacco Use  . Smoking status: Former Smoker    Packs/day: 1.00    Years: 40.00    Pack years: 40.00    Types: Cigarettes    Quit date: 06/28/2008    Years since quitting: 11.2  . Smokeless tobacco: Former User    Quit date: 06/28/2009  Vaping Use  . Vaping Use: Never used  Substance and Sexual Activity  . Alcohol use: Yes    Alcohol/week: 3.0 standard drinks    Types: 2 Glasses of wine, 1 Cans of beer per week    Comment: may have 1 drink a week  . Drug use: No  . Sexual activity: Not on file  Other Topics Concern  . Not on file  Social History Narrative   Married with 3   sons and 1 daughter   Daily caffeine use, 3 per day   Social Determinants of Health   Financial Resource Strain:   . Difficulty of Paying Living Expenses:   Food Insecurity:   . Worried About Charity fundraiser in the Last Year:   . Arboriculturist in the Last Year:   Transportation Needs:   . Film/video editor (Medical):   Marland Kitchen Lack of Transportation (Non-Medical):   Physical Activity:   . Days of Exercise per Week:   . Minutes of Exercise per Session:   Stress:   . Feeling of Stress :   Social Connections:   . Frequency of Communication with Friends and Family:   . Frequency of Social Gatherings with Friends and Family:   . Attends Religious Services:   . Active Member of Clubs or Organizations:   . Attends Archivist Meetings:   Marland Kitchen Marital Status:   Intimate Partner Violence:   . Fear of Current or Ex-Partner:   . Emotionally Abused:   Marland Kitchen Physically Abused:   . Sexually Abused:    Family History:  Family History  Problem Relation Age of Onset  . Kidney disease Mother   . Stroke Father   . Breast cancer Sister        breast progressed into bone  . Colon cancer Neg Hx   . Prostate cancer Neg Hx   . Pancreatic cancer Neg Hx     Review of Systems: Constitutional: Doesn't report fevers,  chills or abnormal weight loss Eyes: Doesn't report blurriness of vision Ears, nose, mouth, throat, and face: Doesn't report sore throat Respiratory: Doesn't report cough, dyspnea or wheezes Cardiovascular: Doesn't report palpitation, chest discomfort  Gastrointestinal:  Doesn't report nausea, constipation, diarrhea GU: Doesn't report incontinence Skin: Doesn't report skin rashes Neurological: Per HPI Musculoskeletal: Doesn't report joint pain Behavioral/Psych: +depression symptoms  Physical Exam: Vitals:   10/09/19 1600 10/09/19 1709  BP:  120/69  Pulse:  62  Resp:    Temp: 98.3 F (36.8 C)   SpO2:     KPS: 80. General: Alert, cooperative, pleasant, in no acute distress Head: Normal EENT: No conjunctival injection or scleral icterus.  Lungs: Resp effort normal Cardiac: Regular rate Abdomen: Non-distended abdomen Skin: No rashes cyanosis or petechiae. Extremities: No clubbing or edema  Neurologic Exam: Mental Status: Awake, alert, attentive to examiner. Oriented to self and environment. Language is fluent with intact comprehension.  Cranial Nerves: Visual acuity is grossly normal. Left hemianopia. Extra-ocular movements intact. No ptosis. Face is symmetric Motor: Tone and bulk are normal. Power is 4+/5 in left arm and leg with predominance of motor neglect. Reflexes are symmetric, no pathologic reflexes present.  Sensory: Intact to light touch Gait: Deferred  Labs: I have reviewed the data as listed    Component Value Date/Time   NA 132 (L) 10/08/2019 1924   NA 139 05/04/2019 0954   K 3.8 10/08/2019 1924   CL 96 (L) 10/08/2019 1924   CO2 26 10/08/2019 1924   GLUCOSE 171 (H) 10/08/2019 1924   BUN 16 10/08/2019 1924   BUN 11 05/04/2019 0954   CREATININE 0.88 10/08/2019 1924   CREATININE 1.02 09/26/2019 0913   CALCIUM 8.9 10/08/2019 1924   PROT 6.2 (L) 10/08/2019 1924   PROT 6.8 05/04/2019 0954   ALBUMIN 3.5 10/08/2019 1924   ALBUMIN 4.5 05/04/2019 0954   AST 20  10/08/2019 1924   AST 15 09/26/2019 0913   ALT 29 10/08/2019 1924  ALT 19 09/26/2019 0913   ALKPHOS 73 10/08/2019 1924   BILITOT 1.5 (H) 10/08/2019 1924   BILITOT 1.4 (H) 09/26/2019 0913   GFRNONAA >60 10/08/2019 1924   GFRNONAA >60 09/26/2019 0913   GFRAA >60 10/08/2019 1924   GFRAA >60 09/26/2019 0913   Lab Results  Component Value Date   WBC 5.2 10/08/2019   NEUTROABS 4.3 10/08/2019   HGB 12.7 (L) 10/08/2019   HCT 37.8 (L) 10/08/2019   MCV 86.5 10/08/2019   PLT 151 10/08/2019   Imaging:  [facility] Clinician Interpretation: I have personally reviewed the CNS images as listed.  My interpretation, in the context of the patient's clinical presentation, is likely treatment effect  CT Head Wo Contrast  Result Date: 10/08/2019 CLINICAL DATA:  History of glioblastoma. Left arm weakness. Headache. Nausea. EXAM: CT HEAD WITHOUT CONTRAST TECHNIQUE: Contiguous axial images were obtained from the base of the skull through the vertex without intravenous contrast. COMPARISON:  Head MRI 07/22/2019 FINDINGS: Brain: Sequelae of right occipital craniotomy for glioblastoma debulking are again identified. There is minimal residual gas in the operative bed. There is new severe vasogenic edema extending from the right occipital lobe throughout the right temporal lobe, white matter tracts posterior to the basal ganglia, into the splenium of the corpus callosum, and into the right parietal lobe with regional sulcal effacement, subtotal effacement of the right lateral ventricle, and 8 mm of leftward midline shift. The left lateral ventricle is stable to at most minimally larger than on the prior MRI without evidence of frank hydrocephalus. Curvilinear hyperdensity along the superior aspect of and posterior to the third ventricle has density favoring calcification over hemorrhage, and no acute intracranial hemorrhage is evident elsewhere. There is an unchanged 1 cm dense calcification along the superomedial aspect of  the left tentorium. No acute large territory cortically based infarct or extra-axial fluid collection is identified. Vascular: Calcified atherosclerosis at the skull base. Skull: Right occipital craniotomy. Sinuses/Orbits: Paranasal sinuses and mastoid air cells are clear. Unremarkable orbits. Other: None. IMPRESSION: 1. New severe vasogenic edema throughout the posterior right cerebral hemisphere with 8 mm of leftward midline shift. 2. No evidence of acute intracranial abnormality. Electronically Signed   By: Allen  Grady M.D.   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  Etiology is radio-inflammatory changes secondary to recently completed radiotherapy.  He has already improved considerably with corticosteroids.  No need to obtain MRI brain while inpatient, this can wait until his scheduled scan on 10/27/19.    Recommended dosing decadron at 4mg BID upon discharge.  We will follow up with him in clinic to continue steroid titration.  Ok with either brief inpatient rehab course or dedicated home PT, pending evals.  Will con't Keppra 500mg for seizure prevention.  All questions were answered. The patient knows to call the clinic with any problems, questions or concerns. No barriers to learning were detected.  The total time spent in the encounter was 55 minutes and more than 50% was on counseling and review of test results    K , MD Medical Director of Neuro-Oncology [facility] [place] 09/26/19 7:29 PM 

## 2019-10-09 NOTE — Evaluation (Signed)
Physical Therapy Evaluation Patient Details Name: [name] MRN: [id number] DOB: [birth year] Today's Date: 10/09/2019   History of Present Illness  74 y.o. 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  CT of the head showed new severe vasogenic edema extending from the right occipital lobe throughout the right temporal lobe with an 8 mm leftward midline shift.   Clinical Impression   Pt admitted with above diagnosis. Patient's wife present and reports pt moving better today (she and son had to lower him to the floor at home). He required min assist for sitting balance, standing and stand-pivot with RW bed to chair. He demonstrated left sided weakness and inattention of left extremities. Unable to attempt further ambulation due to safety with only 1 person assist.  Pt currently with functional limitations due to the deficits listed below (see PT Problem List). Pt will benefit from skilled PT to increase their independence and safety with mobility to allow discharge to the venue listed below.       Follow Up Recommendations CIR    Equipment Recommendations  Rolling walker with 5" wheels    Recommendations for Other Services Rehab consult     Precautions / Restrictions Precautions Precautions: Fall Restrictions Weight Bearing Restrictions: No      Mobility  Bed Mobility Overal bed mobility: Needs Assistance Bed Mobility: Supine to Sit     Supine to sit: Min assist;HOB elevated (with rail)     General bed mobility comments: exit to his right; incr time and min assist due to posterior lean; cues for continue to scoot left pelvis forward for left  foot to reach floor  Transfers Overall transfer level: Needs assistance Equipment used: Rolling walker (2 wheeled) Transfers: Sit to/from [facility] Sit to Stand: Min guard Stand pivot transfers: Min assist       General transfer comment: pt pivoted to his rt to incr safety due to lack of vision on left; inattention to left leg with left foot ~12" forward of right after pivot and step back to chair; cued to step back with left "I already did, it's against the chair"  Ambulation/Gait             General Gait Details: pivotal steps only for safety with +1 assist  Stairs            Wheelchair Mobility    Modified Rankin (Stroke Patients Only)       Balance Overall balance assessment: Needs assistance Sitting-balance support: Single extremity supported;Feet supported Sitting balance-Leahy Scale: Poor   Postural control: Posterior lean Standing balance support: Bilateral upper extremity supported Standing balance-Leahy Scale: Poor Standing balance comment: requires bil UE support                             Pertinent Vitals/Pain Pain Assessment: No/denies pain    Home Living Family/patient expects to be discharged to:: Private residence Living Arrangements: Spouse/significant other Available Help at Discharge: Family;Available 24 hours/day Type of Home: House Home Access: Stairs to enter Entrance Stairs-Rails: Right Entrance Stairs-Number of Steps: 2 Home Layout: Two level;Bed/bath upstairs Home Equipment: Cane - single point      Prior Function Level of Independence:  Needs assistance   Gait / Transfers Assistance Needed: getting OPPT at neurorehab working on balance; using cane; had a fall last week when slipped off edge of chair  ADL's / Homemaking Assistance Needed: needed assist with dressing due to vision loss (going to OPOT at neurorehab)  Comments: just prior to [place], family lowered him to the floor in the bathroom      Hand Dominance   Dominant Hand: Right    Extremity/Trunk Assessment   Upper Extremity Assessment Upper Extremity Assessment: Generalized weakness;LUE deficits/detail LUE Deficits / Details: shoulder flexion 3-, grip 3+    Lower Extremity Assessment Lower Extremity Assessment: Generalized weakness;LLE deficits/detail LLE Deficits / Details: hip flexion 3+, knee extension 3+, ankle DF 3+ LLE Sensation:  (reports intact, but not able to sense whether touching chair)    Cervical / Trunk Assessment Cervical / Trunk Assessment: Normal  Communication   Communication: No difficulties  Cognition Arousal/Alertness: Awake/alert Behavior During Therapy: WFL for tasks assessed/performed Overall Cognitive Status: Impaired/Different from baseline Area of Impairment: Awareness;Safety/judgement;Problem solving                         Safety/Judgement: Decreased awareness of safety;Decreased awareness of deficits Awareness: Intellectual Problem Solving: Difficulty sequencing;Requires verbal cues General Comments: even after requiring assist bed to chair, felt he was OK to go home and go up flight of steps; decr attention to left side      General Comments General comments (skin integrity, edema, etc.): wife present throughout. States he could not figure out how to move his left leg when trying to assist him to bathroom at home yesterday. Her and son had to lower him to the floor    Exercises     Assessment/Plan    PT Assessment Patient needs continued PT services  PT Problem List Decreased strength;Decreased balance;Decreased mobility;Decreased cognition;Decreased knowledge of use of DME;Decreased safety awareness;Impaired sensation       PT Treatment Interventions DME instruction;Gait training;Stair training;Functional mobility training;Therapeutic activities;Therapeutic exercise;Balance training;Neuromuscular re-education;Cognitive remediation;Patient/family education     PT Goals (Current goals can be found in the Care Plan section)  Acute Rehab PT Goals Patient Stated Goal: return home when safe PT Goal Formulation: With patient/family Time For Goal Achievement: 10/23/19 Potential to Achieve Goals: Good    Frequency Min 3X/week   Barriers to discharge        Co-evaluation               AM-PAC PT "6 Clicks" Mobility  Outcome Measure Help needed turning from your back to your side while in a flat bed without using bedrails?: None Help needed moving from lying on your back to sitting on the side of a flat bed without using bedrails?: A Little Help needed moving to and from a bed to a chair (including a wheelchair)?: A Little Help needed standing up from a chair using your arms (e.g., wheelchair or bedside chair)?: A Little Help needed to walk in hospital room?: A Lot Help needed climbing 3-5 steps with a railing? : Total 6 Click Score: 16    End of Session Equipment Utilized During Treatment: Gait belt Activity Tolerance: Patient tolerated treatment well Patient left: in chair;with call bell/phone within reach;with chair alarm set;with family/visitor present Nurse Communication: Mobility status PT Visit Diagnosis: Other abnormalities of gait and mobility (R26.89);Muscle weakness (generalized) (M62.81);Hemiplegia and hemiparesis Hemiplegia - Right/Left: Left Hemiplegia - dominant/non-dominant: Non-dominant Hemiplegia - caused by: Unspecified    Time: 1448-1856 PT  Time Calculation (min) (ACUTE ONLY): 41 min   Charges:   PT Evaluation $PT Eval Moderate Complexity: 1 Mod PT Treatments $Therapeutic Activity: 8-22 mins $Self Care/Home Management: 8-22         Arby Barrette, PT Pager [phone]   Rexanne Mano 10/09/2019, 11:30 AM

## 2019-10-09 NOTE — Progress Notes (Signed)
Inpatient Rehab Admissions Coordinator Note:   Per PT/OT recommendations, pt was screened for CIR candidacy by Gayland Curry, MS, CCC-SLP.  At this time we are recommending an Inpatient Rehab consult.  AC will place consult order per protocol.  Please contact me with questions.   Gayland Curry, [place], [place] Admissions Coordinator [phone] 10/09/19 3:30 PM

## 2019-10-09 NOTE — Plan of Care (Signed)
Patient a new admission from ED, arrived into the unit at 0055.  Patient is alert and oriented, assessment performed skin intact , respiratory even and unlabored. Patient had peripheral left side vision impairment. Vital signs stable no acute distress noted,Patient was oriented to room and equipment, will continue to monitor.

## 2019-10-09 NOTE — Progress Notes (Signed)
[facility] PROGRESS NOTE    Progress Note  [name]  RCV:[id number] DOB: [birth year] DOA: 10/08/2019 PCP: Maurice Small, MD     Brief Narrative:   [name] is an 74 y.o. 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  CT of the head showed new vasogenic edema in the posterior cerebral hemispheres with an 8 mm left ward midline shift was started on IV Decadron  Assessment/Plan:   Worsening left-sided weakness, severe headache with nausea and vomiting due to right occipital glioblastoma multiforme he leading to new  Vasogenic edema [facility]): CT showed new midline shift. He was started on IV Decadron, continue on Keppra for seizure prophylaxis. ED physician spoke with neurosurgery who had no additional recommendation. Consult physical therapy. We will have to consult hospice imperative care to address goals of care and outpatient with terminal glioblastoma.  Patient agrees and would like to talk to palliative care.  Gross hematuria: Patient is on Xarelto, today urine is clean denies any abdominal pain or flank pain. Continue to hold Xarelto.  Chronic diastolic heart failure: Appears to be euvolemic. Continue current home regimen.  Diabetes mellitus without complication ([place]) type II: He is pending he is on steroids which will make his blood glucose erratic, continue sliding scale we will add long-acting insulin.  Essential hypertension Amlodipine and metoprolol.     DVT prophylaxis: lovenox Family Communication:none Status is: Inpatient  Remains inpatient appropriate because:IV treatments appropriate due to intensity of illness or  inability to take PO   Dispo: The patient is from: Home              Anticipated d/c is to: Home              Anticipated d/c date is: 2 days              Patient currently is not medically stable to d/c.        Code Status:     Code Status Orders  (From admission, onward)         Start     Ordered   10/08/19 2217  Full code  Continuous        10/08/19 2216        Code Status History    Date Active Date Inactive Code Status Order ID Comments User Context   07/04/2019 1808 07/07/2019 2156 Full Code [id number]  Ashok Pall, MD Inpatient   07/01/2019 2305 07/03/2019 1629 Full Code [id number]  Vianne Bulls, MD Inpatient   07/01/2019 2156 07/01/2019 2305 Full Code [id number]  Vianne Bulls, MD ED   06/18/2015 2220 06/22/2015 1619 Full Code [id number]  Theressa Millard, MD Inpatient   09/06/2014 1728 09/07/2014 1516 Full Code [id number]  Melina Schools, MD Inpatient   Advance Care Planning Activity        IV Access:    Peripheral IV   Procedures and diagnostic studies:   CT Head Wo Contrast  Result Date: 10/08/2019 CLINICAL DATA:  History of glioblastoma. Left arm weakness. Headache. Nausea. EXAM: CT HEAD WITHOUT CONTRAST TECHNIQUE: Contiguous axial images were obtained from the base of the skull through the vertex without intravenous contrast. COMPARISON:  Head MRI 07/22/2019 FINDINGS: Brain: Sequelae of right  occipital craniotomy for glioblastoma debulking are again identified. There is minimal residual gas in the operative bed. There is new severe vasogenic edema extending from the right occipital lobe throughout the right temporal lobe, white matter tracts posterior to the basal ganglia, into the splenium of the corpus callosum, and into the right parietal lobe with regional sulcal effacement, subtotal effacement of the right lateral ventricle, and 8 mm of leftward midline shift. The left lateral ventricle is stable to at most minimally larger than on the prior MRI without  evidence of frank hydrocephalus. Curvilinear hyperdensity along the superior aspect of and posterior to the third ventricle has density favoring calcification over hemorrhage, and no acute intracranial hemorrhage is evident elsewhere. There is an unchanged 1 cm dense calcification along the superomedial aspect of the left tentorium. No acute large territory cortically based infarct or extra-axial fluid collection is identified. Vascular: Calcified atherosclerosis at the skull base. Skull: Right occipital craniotomy. Sinuses/Orbits: Paranasal sinuses and mastoid air cells are clear. Unremarkable orbits. Other: None. IMPRESSION: 1. New severe vasogenic edema throughout the posterior right cerebral hemisphere with 8 mm of leftward midline shift. 2. No evidence of acute intracranial abnormality. Electronically Signed   By: Logan Bores M.D.   On: 10/08/2019 20:20     Medical Consultants:    None.  Anti-Infectives:   none  Subjective:    [name] review still has some excited weakness, has no appetite.  Objective:    Vitals:   10/08/19 2015 10/09/19 0110 10/09/19 0400 10/09/19 0800  BP:  133/84  118/65  Pulse: 65 72  (!) 54  Resp: (!) 23 12  10   Temp:  99.1 F (37.3 C) 99.5 F (37.5 C) 98.1 F (36.7 C)  TempSrc:  Oral Oral Oral  SpO2: 96% 93%  94%  Weight:      Height:       SpO2: 94 %   Intake/Output Summary (Last 24 hours) at 10/09/2019 0859 Last data filed at 10/09/2019 5361 Gross per 24 hour  Intake --  Output 375 ml  Net -375 ml   Filed Weights   10/08/19 1856  Weight: 99.5 kg    Exam: General exam: In no acute distress. Respiratory system: Good air movement and clear to auscultation. Cardiovascular system: S1 & S2 heard, RRR. No JVD. Gastrointestinal system: Abdomen is nondistended, soft and nontender.  Central nervous system: Alert and oriented.  Left-sided weakness. Extremities: No pedal edema. Skin: No rashes, lesions or ulcers Psychiatry:  Judgement and insight appear normal.    Data Reviewed:    Labs: Basic Metabolic Panel: Recent Labs  Lab 10/08/19 1924  NA 132*  K 3.8  CL 96*  CO2 26  GLUCOSE 171*  BUN 16  CREATININE 0.88  CALCIUM 8.9   GFR Estimated Creatinine Clearance: 94.2 mL/min (by C-G formula based on SCr of 0.88 mg/dL). Liver Function Tests: Recent Labs  Lab 10/08/19 1924  AST 20  ALT 29  ALKPHOS 73  BILITOT 1.5*  PROT 6.2*  ALBUMIN 3.5   No results for input(s): LIPASE, AMYLASE in the last 168 hours. No results for input(s): AMMONIA in the last 168 hours. Coagulation profile Recent Labs  Lab 10/08/19 1924  INR 1.0   COVID-19 Labs  No results for input(s): DDIMER, FERRITIN, LDH, CRP in the last 72 hours.  Lab Results  Component Value Date   [place] NEGATIVE 10/08/2019   [place] NEGATIVE 07/01/2019    CBC: Recent Labs  Lab 10/08/19 1924  WBC  5.2  NEUTROABS 4.3  HGB 12.7*  HCT 37.8*  MCV 86.5  PLT 151   Cardiac Enzymes: No results for input(s): CKTOTAL, CKMB, CKMBINDEX, TROPONINI in the last 168 hours. BNP (last 3 results) No results for input(s): PROBNP in the last 8760 hours. CBG: Recent Labs  Lab 10/08/19 2310 10/09/19 0115 10/09/19 0726  GLUCAP 152* 204* 156*   D-Dimer: No results for input(s): DDIMER in the last 72 hours. Hgb A1c: No results for input(s): HGBA1C in the last 72 hours. Lipid Profile: No results for input(s): CHOL, HDL, LDLCALC, TRIG, CHOLHDL, LDLDIRECT in the last 72 hours. Thyroid function studies: No results for input(s): TSH, T4TOTAL, T3FREE, THYROIDAB in the last 72 hours.  Invalid input(s): FREET3 Anemia work up: No results for input(s): VITAMINB12, FOLATE, FERRITIN, TIBC, IRON, RETICCTPCT in the last 72 hours. Sepsis Labs: Recent Labs  Lab 10/08/19 1924  WBC 5.2   Microbiology Recent Results (from the past 240 hour(s))  SARS Coronavirus 2 by RT PCR (hospital order, performed in [facility] hospital lab)  Nasopharyngeal Nasopharyngeal Swab     Status: None   Collection Time: 10/08/19  7:24 PM   Specimen: Nasopharyngeal Swab  Result Value Ref Range Status   SARS Coronavirus 2 NEGATIVE NEGATIVE Final    Comment: (NOTE) SARS-CoV-2 target nucleic acids are NOT DETECTED.  The SARS-CoV-2 RNA is generally detectable in upper and lower respiratory specimens during the acute phase of infection. The lowest concentration of SARS-CoV-2 viral copies this assay can detect is 250 copies / mL. A negative result does not preclude SARS-CoV-2 infection and should not be used as the sole basis for treatment or other patient management decisions.  A negative result may occur with improper specimen collection / handling, submission of specimen other than nasopharyngeal swab, presence of viral mutation(s) within the areas targeted by this assay, and inadequate number of viral copies (<250 copies / mL). A negative result must be combined with clinical observations, patient history, and epidemiological information.  Fact Sheet for Patients:   StrictlyIdeas.no  Fact Sheet for Healthcare Providers: BankingDealers.co.za  This test is not yet approved or  cleared by the [place] FDA and has been authorized for detection and/or diagnosis of SARS-CoV-2 by FDA under an Emergency Use Authorization (EUA).  This EUA will remain in effect (meaning this test can be used) for the duration of the COVID-19 declaration under Section 564(b)(1) of the Act, 21 U.S.C. section 360bbb-3(b)(1), unless the authorization is terminated or revoked sooner.  Performed at [facility], [place] [address] [place], [place] [address]      Medications:   . amLODipine  10 mg Oral QPM  . atorvastatin  80 mg Oral QHS  . dexamethasone (DECADRON) injection  4 mg Intravenous Q6H  . insulin aspart  0-15 Units Subcutaneous TID WC  . insulin aspart  0-5 Units Subcutaneous QHS  .  levETIRAcetam  500 mg Oral BID  . metoprolol succinate  25 mg Oral QPM   Continuous Infusions:    LOS: 1 day   [name]  [facility]  10/09/2019, 8:59 AM

## 2019-10-10 ENCOUNTER — Ambulatory Visit: Payer: Medicare Other | Admitting: Occupational Therapy

## 2019-10-10 ENCOUNTER — Telehealth: Payer: Self-pay

## 2019-10-10 ENCOUNTER — Ambulatory Visit: Payer: Medicare Other

## 2019-10-10 LAB — GLUCOSE, CAPILLARY
Glucose-Capillary: 147 mg/dL — ABNORMAL HIGH (ref 70–99)
Glucose-Capillary: 193 mg/dL — ABNORMAL HIGH (ref 70–99)
Glucose-Capillary: 133 mg/dL — ABNORMAL HIGH (ref 70–99)
Glucose-Capillary: 178 mg/dL — ABNORMAL HIGH (ref 70–99)

## 2019-10-10 LAB — HEMOGLOBIN A1C
Hgb A1c MFr Bld: 6.9 % — ABNORMAL HIGH (ref 4.8–5.6)
Mean Plasma Glucose: 151 mg/dL

## 2019-10-10 MED ORDER — DEXAMETHASONE 4 MG PO TABS
4.0000 mg | ORAL_TABLET | Freq: Four times a day (QID) | ORAL | Status: DC
  Administered 2019-10-10 – 2019-10-11 (×6): 4 mg via ORAL
  Filled 2019-10-10 (×8): qty 1

## 2019-10-10 MED ORDER — RIVAROXABAN 20 MG PO TABS
20.0000 mg | ORAL_TABLET | Freq: Every day | ORAL | Status: DC
  Filled 2019-10-10 (×2): qty 1

## 2019-10-10 MED ORDER — LOSARTAN POTASSIUM-HCTZ 100-12.5 MG PO TABS: 1.0000 | ORAL_TABLET | Freq: Every day | ORAL | Status: DC

## 2019-10-10 NOTE — Progress Notes (Signed)
Physical Therapy Treatment Patient Details Name: [name] MRN: [id number] DOB: 13-Mar-1946 Today's Date: 10/10/2019    History of Present Illness 74 y.o. 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  CT of the head showed new severe vasogenic edema extending from the right occipital lobe throughout the right temporal lobe with an 8 mm leftward midline shift.     PT Comments    Patient highly motivated and eager to work with therapy. Completed supine exercises for LLE and progressed to standing and walking (+2 assist for safety/lines). Pt walked 20 and 25 ft with moderate assist due to left neglect and left visual deficit. Continues to be an excellent CIR candidate.    Follow Up Recommendations  CIR     Equipment Recommendations  Rolling walker with 5" wheels    Recommendations for Other Services Rehab consult     Precautions / Restrictions Precautions Precautions: Fall;Other (comment) Precaution Comments:  L inattention    Mobility  Bed Mobility Overal bed mobility: Needs Assistance Bed Mobility: Supine to Sit     Supine to sit: HOB elevated;Min guard (with rail)        Transfers Overall transfer level: Needs assistance Equipment used: Rolling walker (2 wheeled) Transfers: Sit to/from Stand Sit to Stand: Min guard         General transfer comment: max cues for sequencing with RW; from bed and from recliner x 2  Ambulation/Gait Ambulation/Gait assistance: +2 safety/equipment;Mod assist (close follow with recliner) Gait Distance (Feet): 20 Feet (seated rest; 25) Assistive device: Rolling walker (2 wheeled) Gait  Pattern/deviations: Step-to pattern;Decreased dorsiflexion - left;Drifts right/left     General Gait Details: despite cues, continued to advance RLE slightly past LLEand take very small steps with LLE; with fatigue, noted left genu recurvatum; tended to drift to the left inside the RW with left foot against back leg of RW   Stairs             Wheelchair Mobility    Modified Rankin (Stroke Patients Only)       Balance Overall balance assessment: Needs assistance Sitting-balance support: Feet supported;No upper extremity supported Sitting balance-Leahy Scale: Fair     Standing balance support: Bilateral upper extremity supported Standing balance-Leahy Scale: Poor Standing balance comment: requires bil UE support                            Cognition Arousal/Alertness: Awake/alert Behavior During Therapy: WFL for tasks assessed/performed Overall Cognitive Status: Impaired/Different from baseline Area of Impairment: Awareness;Safety/judgement;Problem solving                         Safety/Judgement: Decreased awareness of safety;Decreased awareness of deficits Awareness: Emergent Problem Solving: Difficulty sequencing;Requires verbal cues General Comments: Verbals cues to attend to L side (putting left hand on RW; advancing LLE first).  Increased processing time.      Exercises Other Exercises Other Exercises: supine LLE coordination exercises with pt able to direct left heel to rt knee and rt ankle  Other Exercises: LLE SLR with mod cues to attend to keeping knee straight Other Exercises: standing minisquats with focus on controlling left knee into extension  without hyperextension    General Comments General comments (skin integrity, edema, etc.): wife present throughout.       Pertinent Vitals/Pain Pain Assessment: No/denies pain    Home Living                      Prior Function            PT Goals (current goals can now be  found in the care plan section) Acute Rehab PT Goals Patient Stated Goal: return home when safe Time For Goal Achievement: 10/23/19 Potential to Achieve Goals: Good Progress towards PT goals: Progressing toward goals    Frequency    Min 3X/week      PT Plan Current plan remains appropriate    Co-evaluation              AM-PAC PT "6 Clicks" Mobility   Outcome Measure  Help needed turning from your back to your side while in a flat bed without using bedrails?: None Help needed moving from lying on your back to sitting on the side of a flat bed without using bedrails?: A Little Help needed moving to and from a bed to a chair (including a wheelchair)?: A Little Help needed standing up from a chair using your arms (e.g., wheelchair or bedside chair)?: A Little Help needed to walk in hospital room?: A Lot Help needed climbing 3-5 steps with a railing? : Total 6 Click Score: 16    End of Session Equipment Utilized During Treatment: Gait belt Activity Tolerance: Patient tolerated treatment well Patient left: in chair;with call bell/phone within reach;with chair alarm set;with family/visitor present Nurse Communication: Mobility status PT Visit Diagnosis: Other abnormalities of gait and mobility (R26.89);Muscle weakness (generalized) (M62.81);Hemiplegia and hemiparesis Hemiplegia - Right/Left: Left Hemiplegia - dominant/non-dominant: Non-dominant Hemiplegia - caused by: Unspecified     Time: 5945-8592 PT Time Calculation (min) (ACUTE ONLY): 32 min  Charges:  $Gait Training: 8-22 mins $Therapeutic Exercise: 8-22 mins                      Arby Barrette, PT Pager [phone]    [name] 10/10/2019, 4:04 PM

## 2019-10-10 NOTE — Progress Notes (Signed)
Pt refused his Xarelto. Pt stated that he had blood in his urine prior to hospital visit so his oncologist stopped him from taking it. MD notified.

## 2019-10-10 NOTE — Progress Notes (Signed)
Patient ID: CORRADO HYMON, male   DOB: [birth year], 74 y.o.   MRN: [id number]  This NP visited patient at the bedside as a follow up to  yesterday's [place] for  assessment of palliative medicine needs and emotional support.  Patient is alert and oriented, wife is at bedside.  Patient is feeling optimistic and will likely transition to CIR for ongoing rehabilitation.  He is scheduled to follow-up with [name] outpatient.  Patient and his wife remain hopeful for ongoing quality and quantity of life. Emotional support offered  Education offered on the importance of documentation of advanced directives and healthcare power of attorney. Blue book and MOST form left for review.  Patient and family aware that spiritual care department will help with documentation and motorization of these documents while he is here in the hospital.  Discussed with patient the importance of continued conversation with his  family and the medical providers regarding overall plan of care and treatment options,  ensuring decisions are within the context of the patients values and GOCs.  Questions and concerns addressed     PMT will continue to support holistically.  Total time spent on the unit was 20 minutes  Greater than 50% of the time was spent in counseling and coordination of care  Wadie Lessen NP  Palliative Medicine Team Team Phone # [phone] Pager [phone]

## 2019-10-10 NOTE — Progress Notes (Signed)
[facility] PROGRESS NOTE    Progress Note  HAYTHAM MAHER  UXL:[id number] DOB: 11-05-1945 DOA: 10/08/2019 PCP: Maurice Small, MD     Brief Narrative:   EINO WHITNER is an 74 y.o. 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  CT of the head showed new vasogenic edema in the posterior cerebral hemispheres with an 8 mm left ward midline shift was started on IV Decadron  Assessment/Plan:   Worsening left-sided weakness, severe headache with nausea and vomiting due to right occipital glioblastoma multiforme he leading to new  Vasogenic edema [facility]): CT showed new midline shift. Continue Decadron and Keppra. Physical therapy evaluated the patient and recommended CIR. We have consulted inpatient rehab for evaluation. Palliative Care was consulted he was continue to be full code.  Gross hematuria: Resume Xarelto.  Hematuria has resolved likely traumatic.  Chronic diastolic heart failure: Appears to be euvolemic. Continue current home regimen.  Diabetes mellitus without complication ([place]) type II: Blood glucose well controlled he is only eating about 50% of his food continue long-acting insulin plus sliding scale.  Essential hypertension: Continue amlodipine and metoprolol.  He will probably go home off hydrochlorothiazide and losartan.   DVT prophylaxis: lovenox Family Communication:none Status is: Inpatient  Remains inpatient appropriate because: Patient is medically stable to transfer inpatient rehab, awaiting inpatient rehab evaluation.   Dispo: The patient is from: Home              Anticipated d/c is to: Home              Anticipated  d/c date is: 1 days              Patient currently is not medically stable to d/c.  Patient is medically stable for transfer awaiting for inpatient rehab evaluation        Code Status:     Code Status Orders  (From admission, onward)         Start     Ordered   10/08/19 2217  Full code  Continuous        10/08/19 2216        Code Status History    Date Active Date Inactive Code Status Order ID Comments User Context   07/04/2019 1808 07/07/2019 2156 Full Code [id number]  Ashok Pall, MD Inpatient   07/01/2019 2305 07/03/2019 1629 Full Code [id number]  Vianne Bulls, MD Inpatient   07/01/2019 2156 07/01/2019 2305 Full Code [id number]  Vianne Bulls, MD ED   06/18/2015 2220 06/22/2015 1619 Full Code [id number]  Theressa Millard, MD Inpatient   09/06/2014 1728 09/07/2014 1516 Full Code [id number]  Melina Schools, MD Inpatient   Advance Care Planning Activity        IV Access:    Peripheral IV   Procedures and diagnostic studies:   CT Head Wo Contrast  Result Date: 10/08/2019 CLINICAL DATA:  History of glioblastoma. Left arm weakness. Headache. Nausea. EXAM: CT HEAD WITHOUT CONTRAST TECHNIQUE: Contiguous axial images were obtained from the base of the skull through the vertex without intravenous contrast. COMPARISON:  Head MRI 07/22/2019 FINDINGS: Brain: Sequelae of right occipital craniotomy for glioblastoma debulking are again identified. There is minimal residual gas in the operative bed.  There is new severe vasogenic edema extending from the right occipital lobe throughout the right temporal lobe, white matter tracts posterior to the basal ganglia, into the splenium of the corpus callosum, and into the right parietal lobe with regional sulcal effacement, subtotal effacement of the right lateral ventricle, and 8 mm of leftward midline shift. The left lateral ventricle is stable to at most minimally larger than on the prior MRI without evidence of frank hydrocephalus. Curvilinear  hyperdensity along the superior aspect of and posterior to the third ventricle has density favoring calcification over hemorrhage, and no acute intracranial hemorrhage is evident elsewhere. There is an unchanged 1 cm dense calcification along the superomedial aspect of the left tentorium. No acute large territory cortically based infarct or extra-axial fluid collection is identified. Vascular: Calcified atherosclerosis at the skull base. Skull: Right occipital craniotomy. Sinuses/Orbits: Paranasal sinuses and mastoid air cells are clear. Unremarkable orbits. Other: None. IMPRESSION: 1. New severe vasogenic edema throughout the posterior right cerebral hemisphere with 8 mm of leftward midline shift. 2. No evidence of acute intracranial abnormality. Electronically Signed   By: Logan Bores M.D.   On: 10/08/2019 20:20     Medical Consultants:    None.  Anti-Infectives:   none  Subjective:    Elisabeth Pigeon Bartnik related appetite is improving.  Objective:    Vitals:   10/09/19 2336 10/09/19 2338 10/10/19 0400 10/10/19 0800  BP: (!) 141/105 (!) 146/72 133/76   Pulse:  (!) 57 68   Resp:  14 (!) 22   Temp: 98 F (36.7 C) 98 F (36.7 C) 97.6 F (36.4 C) 98.1 F (36.7 C)  TempSrc: Oral Oral Oral Oral  SpO2:  95% 92%   Weight:      Height:       SpO2: 92 %   Intake/Output Summary (Last 24 hours) at 10/10/2019 1057 Last data filed at 10/10/2019 0900 Gross per 24 hour  Intake 960 ml  Output 1200 ml  Net -240 ml   Filed Weights   10/08/19 1856  Weight: 99.5 kg    Exam: General exam: In no acute distress. Respiratory system: Good air movement and clear to auscultation. Cardiovascular system: S1 & S2 heard, RRR. No JVD. Gastrointestinal system: Abdomen is nondistended, soft and nontender.  Extremities: No pedal edema. Skin: No rashes, lesions or ulcers Psychiatry: Judgement and insight appear normal. Mood & affect appropriate.  Data Reviewed:    Labs: Basic Metabolic  Panel: Recent Labs  Lab 10/08/19 1924  NA 132*  K 3.8  CL 96*  CO2 26  GLUCOSE 171*  BUN 16  CREATININE 0.88  CALCIUM 8.9   GFR Estimated Creatinine Clearance: 94.2 mL/min (by C-G formula based on SCr of 0.88 mg/dL). Liver Function Tests: Recent Labs  Lab 10/08/19 1924  AST 20  ALT 29  ALKPHOS 73  BILITOT 1.5*  PROT 6.2*  ALBUMIN 3.5   No results for input(s): LIPASE, AMYLASE in the last 168 hours. No results for input(s): AMMONIA in the last 168 hours. Coagulation profile Recent Labs  Lab 10/08/19 1924  INR 1.0   COVID-19 Labs  No results for input(s): DDIMER, FERRITIN, LDH, CRP in the last 72 hours.  Lab Results  Component Value Date   SARSCOV2NAA NEGATIVE 10/08/2019   [place] NEGATIVE 07/01/2019    CBC: Recent Labs  Lab 10/08/19 1924  WBC 5.2  NEUTROABS 4.3  HGB 12.7*  HCT 37.8*  MCV 86.5  PLT 151   Cardiac Enzymes: No results for  input(s): CKTOTAL, CKMB, CKMBINDEX, TROPONINI in the last 168 hours. BNP (last 3 results) No results for input(s): PROBNP in the last 8760 hours. CBG: Recent Labs  Lab 10/09/19 0726 10/09/19 1133 10/09/19 1605 10/09/19 2113 10/10/19 0750  GLUCAP 156* 216* 184* 172* 147*   D-Dimer: No results for input(s): DDIMER in the last 72 hours. Hgb A1c: Recent Labs    10/08/19 1919  HGBA1C 6.9*   Lipid Profile: No results for input(s): CHOL, HDL, LDLCALC, TRIG, CHOLHDL, LDLDIRECT in the last 72 hours. Thyroid function studies: No results for input(s): TSH, T4TOTAL, T3FREE, THYROIDAB in the last 72 hours.  Invalid input(s): FREET3 Anemia work up: No results for input(s): VITAMINB12, FOLATE, FERRITIN, TIBC, IRON, RETICCTPCT in the last 72 hours. Sepsis Labs: Recent Labs  Lab 10/08/19 1924  WBC 5.2   Microbiology Recent Results (from the past 240 hour(s))  SARS Coronavirus 2 by RT PCR (hospital order, performed in [facility] hospital lab) Nasopharyngeal Nasopharyngeal Swab     Status: None   Collection  Time: 10/08/19  7:24 PM   Specimen: Nasopharyngeal Swab  Result Value Ref Range Status   SARS Coronavirus 2 NEGATIVE NEGATIVE Final    Comment: (NOTE) SARS-CoV-2 target nucleic acids are NOT DETECTED.  The SARS-CoV-2 RNA is generally detectable in upper and lower respiratory specimens during the acute phase of infection. The lowest concentration of SARS-CoV-2 viral copies this assay can detect is 250 copies / mL. A negative result does not preclude SARS-CoV-2 infection and should not be used as the sole basis for treatment or other patient management decisions.  A negative result may occur with improper specimen collection / handling, submission of specimen other than nasopharyngeal swab, presence of viral mutation(s) within the areas targeted by this assay, and inadequate number of viral copies (<250 copies / mL). A negative result must be combined with clinical observations, patient history, and epidemiological information.  Fact Sheet for Patients:   StrictlyIdeas.no  Fact Sheet for Healthcare Providers: BankingDealers.co.za  This test is not yet approved or  cleared by the [place] FDA and has been authorized for detection and/or diagnosis of SARS-CoV-2 by FDA under an Emergency Use Authorization (EUA).  This EUA will remain in effect (meaning this test can be used) for the duration of the COVID-19 declaration under Section 564(b)(1) of the Act, 21 U.S.C. section 360bbb-3(b)(1), unless the authorization is terminated or revoked sooner.  Performed at [facility], [place] [address] [place], [place] [address]      Medications:   . amLODipine  10 mg Oral QPM  . atorvastatin  80 mg Oral QHS  . dexamethasone (DECADRON) injection  4 mg Intravenous Q6H  . insulin aspart  0-15 Units Subcutaneous TID WC  . insulin aspart  0-5 Units Subcutaneous QHS  . insulin detemir  5 Units Subcutaneous BID  . levETIRAcetam  500 mg  Oral BID  . metoprolol succinate  25 mg Oral QPM   Continuous Infusions:    LOS: 2 days   Charlynne Cousins  [facility]  10/10/2019, 10:57 AM

## 2019-10-10 NOTE — Consult Note (Signed)
Physical Medicine and Rehabilitation Consult Reason for Consult: Left side weakness Referring Physician: [facility]   HPI: [name] is a 74 y.o. 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  Per chart review patient lives with spouse.  Two-level home bed and bath upstairs.  He had been receiving outpatient therapy at neuro rehab.  Presented 10/08/2019 with increasing left-sided weakness, headache, nausea and vomiting as well as bouts of hematuria.  CT the head showed new severe vasogenic edema throughout the posterior right cerebral hemisphere with a 8 mm of leftward midline shift.  No evidence of acute intracranial abnormality.  Follow-up medical oncology maintained on Decadron therapy.  Palliative care consulted for goals of care.  Patient remains on [place] for seizure prophylaxis.  His Xarelto is currently on hold due to hematuria.  Therapy evaluations completed with recommendations of physical medicine rehab consult.   Review of Systems  Constitutional: Negative for chills and fever.  HENT: Negative for hearing loss.   Eyes: Negative for blurred vision and double vision.  Respiratory: Positive for shortness of breath. Negative for cough.   Cardiovascular: Positive for palpitations and leg swelling. Negative for chest pain.  Gastrointestinal: Positive for nausea and vomiting.  Genitourinary: Negative for dysuria, flank pain and hematuria.  Musculoskeletal: Positive for myalgias.  Skin: Negative for rash.  Neurological: Positive for seizures, weakness and headaches.  Psychiatric/Behavioral: Positive for depression. The patient has insomnia.         Anxiety  All other systems reviewed and are negative.  Past Medical History:  Diagnosis Date  . Anxiety   . Arthritis   . ASCVD (arteriosclerotic cardiovascular disease)    03/2004: DES x2 to the RCA; IMI with stent occlusion in 02/2005- suboptimal Plavix compliance  . CAD (coronary artery disease)   . Carcinoma of prostate ([place])    Clopidogrel held for biopsy  . Constipation due to pain medication   . Diabetes mellitus without complication ([place])   . Diabetic neuropathy ([place])   . Headache 2021  . Hyperlipidemia   . Hypertension   . Mild depression ([place])   . Morbid obesity ([place])   . Myocardial infarction ([place])   . Shortness of breath dyspnea   . Sleep apnea    no longer uses cpap  . Stroke ([place]) 2017  . Tobacco abuse    stopped smoking 06/28/09   Past Surgical History:  Procedure Laterality Date  . APPLICATION OF CRANIAL NAVIGATION N/A 07/04/2019   Procedure: APPLICATION OF CRANIAL NAVIGATION;  Surgeon: Ashok Pall, MD;  Location: [facility];  Service: Neurosurgery;  Laterality: N/A;  . BACK SURGERY    . CARDIAC CATHETERIZATION     X 1 stent before having CABG  . CORONARY ARTERY BYPASS GRAFT  06/28/2008   X4  . CRANIOTOMY Right 07/04/2019   Procedure: Right occipital craniotomy for tumor with brainlab;  Surgeon: Ashok Pall, MD;  Location: [facility];  Service: Neurosurgery;  Laterality: Right;  . ENDARTERECTOMY Right 07/03/2015   Procedure: RIGHT CAROTID ENDARTERECTOMY WITH BOVINE PERICARDIUM PATCH ANGIOPLASTY;  Surgeon: Serafina Mitchell, MD;  Location: [facility];  Service: Vascular;  Laterality: Right;  . LUMBAR LAMINECTOMY/DECOMPRESSION MICRODISCECTOMY N/A 09/06/2014   Procedure: LUMBER DECOMPRESSION L3-5;  Surgeon: Duane Lope  Rolena Infante, MD;  Location: [facility];  Service: Orthopedics;  Laterality: N/A;  . LUMBAR SPINE SURGERY  2002  . PROSTATE BIOPSY    . PROSTATECTOMY  02/2007  . VASECTOMY     Family History  Problem Relation Age of Onset  . Kidney disease Mother   . Stroke Father   .  Breast cancer Sister        breast progressed into bone  . Colon cancer Neg Hx   . Prostate cancer Neg Hx   . Pancreatic cancer Neg Hx    Social History:  reports that he quit smoking about 11 years ago. His smoking use included cigarettes. He has a 40.00 pack-year smoking history. He quit smokeless tobacco use about 10 years ago. He reports current alcohol use of about 3.0 standard drinks of alcohol per week. He reports that he does not use drugs. Allergies:  Allergies  Allergen Reactions  . Lisinopril Cough   Medications Prior to Admission  Medication Sig Dispense Refill  . acetaminophen (TYLENOL) 500 MG tablet Take 1,000 mg by mouth every 6 (six) hours as needed for headache (pain).    Marland Kitchen amLODipine (NORVASC) 10 MG tablet TAKE 1 TABLET BY MOUTH DAILY (Patient taking differently: Take 10 mg by mouth every evening. ) 90 tablet 0  . atorvastatin (LIPITOR) 80 MG tablet TAKE 1 TABLET BY MOUTH DAILY AT 6 PM (Patient taking differently: Take 80 mg by mouth at bedtime. ) 90 tablet 1  . dexamethasone (DECADRON) 2 MG tablet Take 1 tablet (2 mg total) by mouth daily. (Patient taking differently: Take 3 mg by mouth See admin instructions. Take 1 1/2 tablets (3 mg) by mouth daily, taper down to 1 tablet (2 mg) daily on 10/12/2019, then call [name] for further instructions) 30 tablet 0  . levETIRAcetam (KEPPRA) 500 MG tablet Take 1 tablet (500 mg total) by mouth 2 (two) times daily. 60 tablet 3  . losartan-hydrochlorothiazide (HYZAAR) 100-12.5 MG tablet Take 1 tablet by mouth daily. (Patient taking differently: Take 1 tablet by mouth every evening. ) 90 tablet 2  . metoprolol succinate (TOPROL-XL) 25 MG 24 hr tablet TAKE 1 TABLET BY MOUTH DAILY (Patient taking differently: Take 25 mg by mouth every evening. ) 90 tablet 1  . polyethylene glycol (MIRALAX / GLYCOLAX) 17 g packet Take 17 g by mouth daily as needed (constipation).     . LORazepam (ATIVAN) 0.5 MG tablet 1 tab po q 4-6 hours prn or 1 tab po 30  minutes prior to MRI or radiation (Patient not taking: Reported on 09/26/2019) 30 tablet 0  . rivaroxaban (XARELTO) 20 MG TABS tablet Take 1 tablet (20 mg total) by mouth daily with supper. 30 tablet 3    Home: Home Living Family/patient expects to be discharged to:: Private residence Living Arrangements: Spouse/significant other Available Help at Discharge: Family, Available 24 hours/day Type of Home: House Home Access: Stairs to enter [employer] of Steps: 2 Entrance Stairs-Rails: Right Home Layout: Two level, Bed/bath upstairs Alternate Level Stairs-Number of Steps: 13 Alternate Level Stairs-Rails: Right, Left, Can reach both Bathroom Shower/Tub: Walk-in shower, Curtain Biochemist, clinical: Handicapped height Bathroom Accessibility: Yes Home Equipment: Cane - single point  Functional History: Prior Function Level of Independence: Needs assistance Gait / Transfers Assistance Needed: getting OPPT at neurorehab working on balance; using cane; had a fall last week when slipped off edge of chair ADL's / Homemaking Assistance Needed: needed assist with dressing due to vision loss (going to OPOT at neurorehab)  Comments: just prior to admision, family lowered him to the floor in the bathroom Functional Status:  Mobility: Bed Mobility Overal bed mobility: Needs Assistance Bed Mobility: Supine to Sit Supine to sit: Min assist, HOB elevated (with rail) General bed mobility comments: OOB in chair Transfers Overall transfer level: Needs assistance Equipment used: Rolling walker (2 wheeled) Transfers: Sit to/from Stand Sit to Stand: Min guard Stand pivot transfers: Min assist General transfer comment: pt pivoted to his rt to incr safety due to lack of vision on left; inattention to left leg with left foot ~12" forward of right after pivot and step back to chair; cued to step back with left "I already did, it's against the chair" Ambulation/Gait General Gait Details: pivotal steps  only for safety with +1 assist    ADL: ADL Overall ADL's : Needs assistance/impaired Eating/Feeding: Set up, Sitting Grooming: Set up, Sitting Upper Body Bathing: Minimal assistance, Sitting Lower Body Bathing: Moderate assistance, Sit to/from stand Upper Body Dressing : Minimal assistance, Sitting Lower Body Dressing: Moderate assistance, Sit to/from stand Toilet Transfer: Minimal assistance, RW, BSC Functional mobility during ADLs: Minimal assistance, Rolling walker General ADL Comments: Requires increased time and cueing for moving L UE.  Cognition: Cognition Overall Cognitive Status: Impaired/Different from baseline Orientation Level: Oriented X4 Cognition Arousal/Alertness: Awake/alert Behavior During Therapy: WFL for tasks assessed/performed Overall Cognitive Status: Impaired/Different from baseline Area of Impairment: Awareness, Safety/judgement, Problem solving Safety/Judgement: Decreased awareness of safety, Decreased awareness of deficits Awareness: Emergent Problem Solving: Difficulty sequencing, Requires verbal cues General Comments: Verbals cues to move/be aware of L side.  Increased processing time.  Blood pressure 133/76, pulse 68, temperature 97.6 F (36.4 C), temperature source Oral, resp. rate (!) 22, height 6\' 2"  (1.88 m), weight 99.5 kg, SpO2 92 %.  Physical Exam  General: Alert and oriented x 3, No apparent distress HEENT: Head is normocephalic, atraumatic, PERRLA, EOMI, sclera anicteric, oral mucosa pink and moist, dentition intact, ext ear canals clear,  Neck: Supple without JVD or lymphadenopathy Heart: Reg rate and rhythm. No murmurs rubs or gallops Chest: CTA bilaterally without wheezes, rales, or rhonchi; no distress Abdomen: Soft, non-tender, non-distended, bowel sounds positive. Extremities: No clubbing, cyanosis, or edema. Pulses are 2+ Skin: Clean and intact without signs of breakdown Neuro: Patient is alert and cooperative.  Follows commands.   Oriented x3. 5/5 strength right side, 4/5 strength left side. Sensation intact Psych: Pt's affect is appropriate. Pt is cooperative   Results for orders placed or performed during the hospital encounter of 10/08/19 (from the past 24 hour(s))  Glucose, capillary     Status: Abnormal   Collection Time: 10/09/19  7:26 AM  Result Value Ref Range   Glucose-Capillary 156 (H) 70 - 99 mg/dL  Glucose, capillary     Status: Abnormal   Collection Time: 10/09/19 11:33 AM  Result Value Ref Range   Glucose-Capillary 216 (H) 70 - 99 mg/dL  Glucose, capillary     Status: Abnormal   Collection Time: 10/09/19  4:05 PM  Result Value Ref Range   Glucose-Capillary 184 (H) 70 - 99 mg/dL  Glucose, capillary     Status: Abnormal   Collection Time: 10/09/19  9:13 PM  Result Value Ref Range   Glucose-Capillary 172 (H) 70 - 99 mg/dL   CT Head Wo Contrast  Result Date: 10/08/2019 CLINICAL DATA:  History of glioblastoma. Left arm weakness. Headache. Nausea. EXAM: CT HEAD WITHOUT CONTRAST TECHNIQUE: Contiguous axial images were obtained from the base of the skull through  the vertex without intravenous contrast. COMPARISON:  Head MRI 07/22/2019 FINDINGS: Brain: Sequelae of right occipital craniotomy for glioblastoma debulking are again identified. There is minimal residual gas in the operative bed. There is new severe vasogenic edema extending from the right occipital lobe throughout the right temporal lobe, white matter tracts posterior to the basal ganglia, into the splenium of the corpus callosum, and into the right parietal lobe with regional sulcal effacement, subtotal effacement of the right lateral ventricle, and 8 mm of leftward midline shift. The left lateral ventricle is stable to at most minimally larger than on the prior MRI without evidence of frank hydrocephalus. Curvilinear hyperdensity along the superior aspect of and posterior to the third ventricle has density favoring calcification over hemorrhage, and no  acute intracranial hemorrhage is evident elsewhere. There is an unchanged 1 cm dense calcification along the superomedial aspect of the left tentorium. No acute large territory cortically based infarct or extra-axial fluid collection is identified. Vascular: Calcified atherosclerosis at the skull base. Skull: Right occipital craniotomy. Sinuses/Orbits: Paranasal sinuses and mastoid air cells are clear. Unremarkable orbits. Other: None. IMPRESSION: 1. New severe vasogenic edema throughout the posterior right cerebral hemisphere with 8 mm of leftward midline shift. 2. No evidence of acute intracranial abnormality. Electronically Signed   By: Logan Bores M.D.   On: 10/08/2019 20:20    Assessment/Plan: Diagnosis: Worsening left sided weakness, severe headache with nausea and vomiting due to right occipital glioblastoma multiforme resulting in vasogenic edema 1. Does the need for close, 24 hr/day medical supervision in concert with the patient's rehab needs make it unreasonable for this patient to be served in a less intensive setting? Yes 2. Co-Morbidities requiring supervision/potential complications: gross hematuria, chronic diastolic heart failure, diabetes mellitus without complication type 2, essential HTN, left sided weakness 3. Due to bladder management, bowel management, safety, skin/wound care, disease management, medication administration, pain management and patient education, does the patient require 24 hr/day rehab nursing? Yes 4. Does the patient require coordinated care of a physician, rehab nurse, therapy disciplines of PT, OT to address physical and functional deficits in the context of the above medical diagnosis(es)? Yes Addressing deficits in the following areas: balance, endurance, locomotion, strength, transferring, bowel/bladder control, bathing, dressing, feeding, grooming, toileting and psychosocial support 5. Can the patient actively participate in an intensive therapy program of at  least 3 hrs of therapy per day at least 5 days per week? Yes 6. The potential for patient to make measurable gains while on inpatient rehab is excellent 7. Anticipated functional outcomes upon discharge from inpatient rehab are modified independent  with PT, modified independent with OT, independent with SLP. 8. Estimated rehab length of stay to reach the above functional goals is: 10-14 days 9. Anticipated discharge destination: Home 10. Overall Rehab/Functional Prognosis: excellent  RECOMMENDATIONS: This patient's condition is appropriate for continued rehabilitative care in the following setting: CIR Patient has agreed to participate in recommended program. Yes Note that insurance prior authorization may be required for reimbursement for recommended care.  Comment: Mr. Monacelli would be an excellent CIR candidate. He will have 24/7 support from his wife upon discharge.   Lavon Paganini Angiulli, PA-C 10/10/2019   I have personally performed a face to face diagnostic evaluation, including, but not limited to relevant history and physical exam findings, of this patient and developed relevant assessment and plan.  Additionally, I have reviewed and concur with the physician assistant's documentation above.  Leeroy Cha, MD

## 2019-10-10 NOTE — Progress Notes (Signed)
Nurse went into pt's room. Pt was standing at foot of bed with telemetry wires and scd's cords tangled up. Pt stating he has to go to bathroom (has to pee). Assisted pt back to bed and put new condom cath on. After getting pt back in bed pt stated when he woke up he didn't know where he was. Pt is A&Ox4 now. Pt appreciative for nursing staff assistance. Explained to pt to call for assistance before getting out of bed to prevent falls and instructed pt how to use call bell. Pt verbalized understanding. Bed alarm turned on. Will continue to monitor pt. Ranelle Oyster, RN

## 2019-10-10 NOTE — Telephone Encounter (Signed)
Called and spoke with patient's wife re: recent hospitalization and need for resume orders to return to outpatient therapy when pt is able. Wife reports he may be transferred to inpatient rehab and therefore we would need to d/c from outpatient rehab if this happens. [name] verbalized understanding with plan.

## 2019-10-10 NOTE — Progress Notes (Signed)
Inpatient Rehab Admissions:  Inpatient Rehab Consult received.  I met with patient and his wife at the bedside for rehabilitation assessment and to discuss goals and expectations of an inpatient rehab admission.  They are both hopeful for CIR to return to [place] [facility] prior to going home.  Wife can provide 24/7 supervision, and son lives nearby and can assist if needed.  Will continue to follow for timing of potential admission pending bed availability.   Signed: Shann Medal, PT, DPT Admissions Coordinator [phone] 10/10/19  3:16 PM

## 2019-10-10 NOTE — PMR Pre-admission (Addendum)
PMR Admission Coordinator Pre-Admission Assessment  Patient: [name] is an 74 y.o., male MRN: [id number] DOB: [birth year] Height: 6\' 2"  (188 cm) Weight: 99.5 kg              Insurance Information HMO:     PPO:     PCP:      IPA:      80/20:      OTHER:  PRIMARY: Medicare A and B      Policy#: [id number]      Subscriber: pt CM Name:       Phone#:      Fax#:  Pre-Cert#: verified online      Employer:  Benefits:  Phone #:      Name:  Eff. Date: A and B 12/20/10     Deduct: $1484      Out of Pocket Max: n/a      Life Max: n/a  CIR: 100%      SNF: 20 full days Outpatient: 80%     Co-Pay: 20% Home Health: 100%      Co-Pay: DME: 80%     Co-Pay: 20% Providers: n/a SECONDARY:       Policy#:       Phone#:   Development worker, community:       Phone#:   The "Data Collection Information Summary" for patients in Inpatient Rehabilitation Facilities with attached "Privacy Act [place] Records" was provided and verbally reviewed with: Patient and Family  Emergency Contact Information Contact Information     Name Relation Home Work Mobile   [place] Spouse [phone]  [phone]      Current Medical History  Patient Admitting Diagnosis: vasogenic edema with 8 mm MLS following radiation and glioblastoma resection  History of Present Illness: [name] is a 73 year old right-handed male 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, prostate cancer status post radiation as well as prostatectomy, CAD with CABG 2010 maintained on Xarelto, chronic diastolic congestive heart failure, type 2 diabetes mellitus, hypertension, hyperlipidemia, sleep apnea not on CPAP.  Presented 10/08/2019 with increasing left side weakness headache nausea vomiting as well as bouts of hematuria.  Admission chemistries with sodium 132,  glucose 171, creatinine 0.88, hemoglobin 12.7, urinalysis negative nitrite.  CT of the head showed new severe vasogenic edema throughout the posterior right cerebral hemisphere with 8 mm leftward midline shift.  No evidence of acute intracranial abnormality.  Follow-up medical oncology services maintained on Decadron therapy.  Palliative care consulted for goals of care.  Patient remains on [place] for seizure prophylaxis.  Patient's chronic Xarelto had initially been held due to some hematuria since resumed.  Therapy evaluations completed and patient was recommended for a comprehensive rehab program.  Complete NIHSS TOTAL: 4 Glasgow Coma Scale Score: 15  Past Medical History  Past Medical History:  Diagnosis Date  . Anxiety   . Arthritis   . ASCVD (arteriosclerotic cardiovascular disease)    03/2004: DES x2 to the RCA; IMI with stent occlusion in 02/2005- suboptimal Plavix compliance  . CAD (coronary artery disease)   . Carcinoma of prostate ([place])    Clopidogrel held for biopsy  . Constipation due to pain medication   . Diabetes mellitus without complication ([place])   . Diabetic neuropathy ([place])   . Headache 2021  . Hyperlipidemia   . Hypertension   . Mild depression ([place])   . Morbid  obesity ([place])   . Myocardial infarction ([place])   . Shortness of breath dyspnea   . Sleep apnea    no longer uses cpap  . Stroke ([place]) 2017  . Tobacco abuse    stopped smoking 06/28/09    Family History  family history includes Breast cancer in his sister; Kidney disease in his mother; Stroke in his father.  Prior Rehab/Hospitalizations:  Has the patient had prior rehab or hospitalizations prior to admission? Yes  Has the patient had major surgery during 100 days prior to admission? Yes  Current Medications   Current Facility-Administered Medications:  .  acetaminophen (TYLENOL) tablet 650 mg, 650 mg, Oral, Q6H PRN **OR** acetaminophen (TYLENOL) suppository 650 mg, 650 mg, Rectal, Q6H PRN, Shela Leff, MD .  amLODipine (NORVASC) tablet 10 mg, 10 mg, Oral, QPM, Shela Leff, MD, 10 mg at 10/10/19 1638 .  atorvastatin (LIPITOR) tablet 80 mg, 80 mg, Oral, QHS, Shela Leff, MD, 80 mg at 10/10/19 2233 .  dexamethasone (DECADRON) tablet 4 mg, 4 mg, Oral, Q6H, Charlynne Cousins, MD, 4 mg at 10/11/19 1224 .  insulin aspart (novoLOG) injection 0-15 Units, 0-15 Units, Subcutaneous, TID WC, Shela Leff, MD, 3 Units at 10/11/19 1224 .  insulin aspart (novoLOG) injection 0-5 Units, 0-5 Units, Subcutaneous, QHS, Rathore, Vasundhra, MD .  insulin detemir (LEVEMIR) injection 5 Units, 5 Units, Subcutaneous, BID, Charlynne Cousins, MD, 5 Units at 10/11/19 [phone] .  levETIRAcetam (KEPPRA) tablet 500 mg, 500 mg, Oral, BID, Shela Leff, MD, 500 mg at 10/11/19 0816 .  metoprolol succinate (TOPROL-XL) 24 hr tablet 25 mg, 25 mg, Oral, QPM, Shela Leff, MD, 25 mg at 10/10/19 1639 .  morphine 2 MG/ML injection 2 mg, 2 mg, Intravenous, Q4H PRN, Shela Leff, MD .  ondansetron (ZOFRAN) injection 4 mg, 4 mg, Intravenous, Q6H PRN, Shela Leff, MD .  polyethylene glycol (MIRALAX / GLYCOLAX) packet 17 g, 17 g, Oral, Daily PRN, Shela Leff, MD .  rivaroxaban (XARELTO) tablet 20 mg, 20 mg, Oral, Q supper, Charlynne Cousins, MD  Patients Current Diet:  Diet Order             Diet Heart Room service appropriate? Yes; Fluid consistency: Thin  Diet effective now                   Precautions / Restrictions Precautions Precautions: Fall, Other (comment) Precaution Comments:  L inattention Restrictions Weight Bearing Restrictions: No   Has the patient had 2 or more falls or a fall with injury in the past year?No  Prior Activity Level Community (5-7x/wk): s/p glioblastoma resection and radiation/chemo since March of this year, no longer driving, using RW for ambulation, and needed occasional assist for ADLs for time management and  organization  Prior Functional Level Prior Function Level of Independence: Needs assistance Gait / Transfers Assistance Needed: getting OPPT at neurorehab working on balance; using cane; had a fall last week when slipped off edge of chair ADL's / Homemaking Assistance Needed: needed assist with dressing due to vision loss (going to OPOT at neurorehab) Comments: just prior to [place], family lowered him to the floor in the bathroom  Self Care: Did the patient need help bathing, dressing, using the toilet or eating?  Independent, but wife would help for time management  Indoor Mobility: Did the patient need assistance with walking from room to room (with or without device)? Independent  Stairs: Did the patient need assistance with internal or external stairs (with or without  device)? Independent  Functional Cognition: Did the patient need help planning regular tasks such as shopping or remembering to take medications? Independent  Home Assistive Devices / Equipment Home Equipment: Cane - single point  Prior Device Use: Indicate devices/aids used by the patient prior to current illness, exacerbation or injury? Walker  Current Functional Level Cognition  Overall Cognitive Status: Impaired/Different from baseline Orientation Level: Oriented X4 Safety/Judgement: Decreased awareness of safety, Decreased awareness of deficits General Comments: Verbals cues to attend to L side (putting left hand on RW; advancing LLE first).  Increased processing time.    Extremity Assessment (includes Sensation/Coordination)  Upper Extremity Assessment: Overall WFL for tasks assessed, LUE deficits/detail LUE Deficits / Details: Shoulder 3-/5 strength, rest of arm/hand 3+/5.  Less awareness of L UE.  Poor coordination, finger to nose. LUE Sensation: decreased proprioception LUE Coordination: decreased fine motor, decreased gross motor  Lower Extremity Assessment: Defer to PT evaluation LLE Deficits /  Details: hip flexion 3+, knee extension 3+, ankle DF 3+ LLE Sensation:  (reports intact, but not able to sense whether touching chair)    ADLs  Overall ADL's : Needs assistance/impaired Eating/Feeding: Set up, Sitting Grooming: Set up, Sitting Upper Body Bathing: Minimal assistance, Sitting Lower Body Bathing: Moderate assistance, Sit to/from stand Upper Body Dressing : Minimal assistance, Sitting Lower Body Dressing: Moderate assistance, Sit to/from stand Toilet Transfer: Minimal assistance, RW, BSC Functional mobility during ADLs: Minimal assistance, Rolling walker General ADL Comments: Requires increased time and cueing for moving L UE.    Mobility  Overal bed mobility: Needs Assistance Bed Mobility: Supine to Sit Supine to sit: HOB elevated, Min guard (with rail) General bed mobility comments: OOB in chair    Transfers  Overall transfer level: Needs assistance Equipment used: Rolling walker (2 wheeled) Transfers: Sit to/from Stand Sit to Stand: Min guard Stand pivot transfers: Min assist General transfer comment: max cues for sequencing with RW; from bed and from recliner x 2    Ambulation / Gait / Stairs / Wheelchair Mobility  Ambulation/Gait Ambulation/Gait assistance: +2 safety/equipment, Mod assist (close follow with recliner) Gait Distance (Feet): 20 Feet (seated rest; 25) Assistive device: Rolling walker (2 wheeled) Gait Pattern/deviations: Step-to pattern, Decreased dorsiflexion - left, Drifts right/left General Gait Details: despite cues, continued to advance RLE slightly past LLEand take very small steps with LLE; with fatigue, noted left genu recurvatum; tended to drift to the left inside the RW with left foot against back leg of RW    Posture / Balance Balance Overall balance assessment: Needs assistance Sitting-balance support: Feet supported, No upper extremity supported Sitting balance-Leahy Scale: Fair Postural control: Posterior lean Standing balance  support: Bilateral upper extremity supported Standing balance-Leahy Scale: Poor Standing balance comment: requires bil UE support    Special needs/care consideration Diabetic management yes (monitor blood glucose while on steroids) and Designated visitor [place] (from acute therapy documentation) Living Arrangements: Spouse/significant other Available Help at Discharge: Family, Available 24 hours/day Type of Home: House Home Layout: Two level, Bed/bath upstairs Alternate Level Stairs-Rails: Right, Left, Can reach both Alternate Level Stairs-Number of Steps: 13 Home Access: Stairs to enter Entrance Stairs-Rails: Right Entrance Stairs-Number of Steps: 2 Bathroom Shower/Tub: Gaffer, Architectural technologist: Handicapped height Bathroom Accessibility: Yes  Discharge Living Setting Plans for Discharge Living Setting: Patient's home Type of Home at Discharge: House Discharge Home Layout: Two level, Bed/bath upstairs Alternate Level Stairs-Rails: Right, Left (left rail is 1/2 rail, then transitions to  wall ) Alternate Level Stairs-Number of Steps: full flight Discharge Home Access: Stairs to enter Entrance Stairs-Rails: Right Entrance Stairs-Number of Steps: 2 through garage Discharge Bathroom Shower/Tub: Walk-in shower Discharge Bathroom Toilet: Handicapped height Discharge Bathroom Accessibility: Yes How Accessible: Accessible via walker Does the patient have any problems obtaining your medications?: No  Social/Family/Support Systems Patient Roles: Spouse Anticipated Caregiver: wife, Avis Epley Anticipated Caregiver's Contact Information: [phone] Ability/Limitations of Caregiver: supervision to min assist Caregiver Availability: 24/7 Discharge Plan Discussed with Primary Caregiver: Yes Is Caregiver In Agreement with Plan?: Yes Does Caregiver/Family have Issues with Lodging/Transportation while Pt is in Rehab?:  No   Goals Patient/Family Goal for Rehab: PT/OT Supervision, SLP independent Expected length of stay: 12-16 days Pt/Family Agrees to Admission and willing to participate: Yes Program Orientation Provided & Reviewed with Pt/Caregiver Including Roles  & Responsibilities: Yes   Decrease burden of Care through IP rehab admission: n/a  Possible need for SNF placement upon discharge:  Not anticipated  Patient Condition: This patient's condition remains as documented in the consult dated 6/22, in which the Rehabilitation Physician determined and documented that the patient's condition is appropriate for intensive rehabilitative care in an inpatient rehabilitation facility. Will admit to inpatient rehab today.  Preadmission Screen Completed By:  Michel Santee, PT, DPT 10/11/2019 1:17 PM ______________________________________________________________________   Discussed status with Dr. Posey Pronto on 10/11/19 at  1:17 PM  and received approval for admission today.  Admission Coordinator:  Michel Santee, PT, DPT time 1:17 PM Sudie Grumbling 10/11/19

## 2019-10-11 ENCOUNTER — Inpatient Hospital Stay (HOSPITAL_COMMUNITY)
Admission: RE | Admit: 2019-10-11 | Discharge: 2019-10-18 | DRG: 056 | Disposition: A | Discharge status: Home / Self Care | Payer: Medicare Other | Source: Intra-hospital | Attending: Physical Medicine & Rehabilitation | Admitting: Physical Medicine & Rehabilitation

## 2019-10-11 ENCOUNTER — Other Ambulatory Visit: Payer: Self-pay

## 2019-10-11 ENCOUNTER — Encounter (HOSPITAL_COMMUNITY): Payer: Self-pay | Admitting: Physical Medicine & Rehabilitation

## 2019-10-11 DIAGNOSIS — C719 Malignant neoplasm of brain, unspecified: Secondary | ICD-10-CM | POA: Diagnosis present

## 2019-10-11 DIAGNOSIS — G9340 Encephalopathy, unspecified: Secondary | ICD-10-CM

## 2019-10-11 DIAGNOSIS — K5900 Constipation, unspecified: Secondary | ICD-10-CM | POA: Diagnosis present

## 2019-10-11 DIAGNOSIS — C714 Malignant neoplasm of occipital lobe: Secondary | ICD-10-CM | POA: Diagnosis present

## 2019-10-11 DIAGNOSIS — R001 Bradycardia, unspecified: Secondary | ICD-10-CM | POA: Diagnosis present

## 2019-10-11 DIAGNOSIS — Z7901 Long term (current) use of anticoagulants: Secondary | ICD-10-CM | POA: Diagnosis not present

## 2019-10-11 DIAGNOSIS — Z87891 Personal history of nicotine dependence: Secondary | ICD-10-CM

## 2019-10-11 DIAGNOSIS — I5032 Chronic diastolic (congestive) heart failure: Secondary | ICD-10-CM | POA: Diagnosis present

## 2019-10-11 DIAGNOSIS — G4730 Sleep apnea, unspecified: Secondary | ICD-10-CM | POA: Diagnosis present

## 2019-10-11 DIAGNOSIS — Z79899 Other long term (current) drug therapy: Secondary | ICD-10-CM | POA: Diagnosis not present

## 2019-10-11 DIAGNOSIS — Z8673 Personal history of transient ischemic attack (TIA), and cerebral infarction without residual deficits: Secondary | ICD-10-CM

## 2019-10-11 DIAGNOSIS — E785 Hyperlipidemia, unspecified: Secondary | ICD-10-CM | POA: Diagnosis present

## 2019-10-11 DIAGNOSIS — Z8546 Personal history of malignant neoplasm of prostate: Secondary | ICD-10-CM

## 2019-10-11 DIAGNOSIS — I110 Hypertensive heart disease with heart failure: Secondary | ICD-10-CM | POA: Diagnosis present

## 2019-10-11 DIAGNOSIS — E119 Type 2 diabetes mellitus without complications: Secondary | ICD-10-CM

## 2019-10-11 DIAGNOSIS — Z951 Presence of aortocoronary bypass graft: Secondary | ICD-10-CM

## 2019-10-11 DIAGNOSIS — G936 Cerebral edema: Secondary | ICD-10-CM | POA: Diagnosis present

## 2019-10-11 DIAGNOSIS — G8194 Hemiplegia, unspecified affecting left nondominant side: Principal | ICD-10-CM | POA: Diagnosis present

## 2019-10-11 DIAGNOSIS — I2510 Atherosclerotic heart disease of native coronary artery without angina pectoris: Secondary | ICD-10-CM | POA: Diagnosis present

## 2019-10-11 DIAGNOSIS — I252 Old myocardial infarction: Secondary | ICD-10-CM

## 2019-10-11 DIAGNOSIS — Z923 Personal history of irradiation: Secondary | ICD-10-CM

## 2019-10-11 DIAGNOSIS — Z823 Family history of stroke: Secondary | ICD-10-CM | POA: Diagnosis not present

## 2019-10-11 DIAGNOSIS — E1142 Type 2 diabetes mellitus with diabetic polyneuropathy: Secondary | ICD-10-CM

## 2019-10-11 DIAGNOSIS — Z7952 Long term (current) use of systemic steroids: Secondary | ICD-10-CM | POA: Diagnosis not present

## 2019-10-11 DIAGNOSIS — Z841 Family history of disorders of kidney and ureter: Secondary | ICD-10-CM

## 2019-10-11 DIAGNOSIS — Z298 Encounter for other specified prophylactic measures: Secondary | ICD-10-CM

## 2019-10-11 DIAGNOSIS — Z803 Family history of malignant neoplasm of breast: Secondary | ICD-10-CM | POA: Diagnosis not present

## 2019-10-11 DIAGNOSIS — Z9221 Personal history of antineoplastic chemotherapy: Secondary | ICD-10-CM | POA: Diagnosis not present

## 2019-10-11 DIAGNOSIS — R310 Gross hematuria: Secondary | ICD-10-CM

## 2019-10-11 DIAGNOSIS — R531 Weakness: Secondary | ICD-10-CM

## 2019-10-11 DIAGNOSIS — I10 Essential (primary) hypertension: Secondary | ICD-10-CM

## 2019-10-11 LAB — GLUCOSE, CAPILLARY
Glucose-Capillary: 156 mg/dL — ABNORMAL HIGH (ref 70–99)
Glucose-Capillary: 186 mg/dL — ABNORMAL HIGH (ref 70–99)
Glucose-Capillary: 148 mg/dL — ABNORMAL HIGH (ref 70–99)
Glucose-Capillary: 208 mg/dL — ABNORMAL HIGH (ref 70–99)

## 2019-10-11 LAB — CBC WITH DIFFERENTIAL/PLATELET
Abs Immature Granulocytes: 0.02 10*3/uL (ref 0.00–0.07)
Basophils Absolute: 0 10*3/uL (ref 0.0–0.1)
Basophils Relative: 0 %
Eosinophils Absolute: 0 10*3/uL (ref 0.0–0.5)
Eosinophils Relative: 0 %
HCT: 37.6 % — ABNORMAL LOW (ref 39.0–52.0)
Hemoglobin: 12.8 g/dL — ABNORMAL LOW (ref 13.0–17.0)
Immature Granulocytes: 0 %
Lymphocytes Relative: 2 %
Lymphs Abs: 0.2 10*3/uL — ABNORMAL LOW (ref 0.7–4.0)
MCH: 28.9 pg (ref 26.0–34.0)
MCHC: 34 g/dL (ref 30.0–36.0)
MCV: 84.9 fL (ref 80.0–100.0)
Monocytes Absolute: 0.2 10*3/uL (ref 0.1–1.0)
Monocytes Relative: 3 %
Neutro Abs: 6.6 10*3/uL (ref 1.7–7.7)
Neutrophils Relative %: 95 %
Platelets: 169 10*3/uL (ref 150–400)
RBC: 4.43 MIL/uL (ref 4.22–5.81)
RDW: 13.2 % (ref 11.5–15.5)
WBC: 7 10*3/uL (ref 4.0–10.5)
nRBC: 0 % (ref 0.0–0.2)

## 2019-10-11 LAB — COMPREHENSIVE METABOLIC PANEL
ALT: 27 U/L (ref 0–44)
AST: 17 U/L (ref 15–41)
Albumin: 3.2 g/dL — ABNORMAL LOW (ref 3.5–5.0)
Alkaline Phosphatase: 60 U/L (ref 38–126)
Anion gap: 8 (ref 5–15)
BUN: 26 mg/dL — ABNORMAL HIGH (ref 8–23)
CO2: 29 mmol/L (ref 22–32)
Calcium: 8.8 mg/dL — ABNORMAL LOW (ref 8.9–10.3)
Chloride: 94 mmol/L — ABNORMAL LOW (ref 98–111)
Creatinine, Ser: 0.92 mg/dL (ref 0.61–1.24)
GFR calc Af Amer: >60 mL/min (ref >60)
GFR calc non Af Amer: >60 mL/min (ref >60)
Glucose, Bld: 227 mg/dL — ABNORMAL HIGH (ref 70–99)
Potassium: 4.5 mmol/L (ref 3.5–5.1)
Sodium: 131 mmol/L — ABNORMAL LOW (ref 135–145)
Total Bilirubin: 1.2 mg/dL (ref 0.3–1.2)
Total Protein: 5.9 g/dL — ABNORMAL LOW (ref 6.5–8.1)

## 2019-10-11 LAB — MAGNESIUM: Magnesium: 1.8 mg/dL (ref 1.7–2.4)

## 2019-10-11 LAB — PHOSPHORUS: Phosphorus: 4 mg/dL (ref 2.5–4.6)

## 2019-10-11 MED ORDER — INSULIN ASPART 100 UNIT/ML ~~LOC~~ SOLN: 0.0000 [IU] | Freq: Three times a day (TID) | SUBCUTANEOUS | 0 refills | Status: DC

## 2019-10-11 MED ORDER — LEVETIRACETAM 500 MG PO TABS
500.0000 mg | ORAL_TABLET | Freq: Two times a day (BID) | ORAL | Status: DC
  Administered 2019-10-11 – 2019-10-18 (×14): 500 mg via ORAL
  Filled 2019-10-11 (×14): qty 1

## 2019-10-11 MED ORDER — ACETAMINOPHEN 325 MG PO TABS: 650.0000 mg | ORAL_TABLET | Freq: Four times a day (QID) | ORAL | 0 refills | Status: DC | PRN

## 2019-10-11 MED ORDER — ONDANSETRON HCL 4 MG PO TABS: 4.0000 mg | ORAL_TABLET | Freq: Three times a day (TID) | ORAL | 0 refills | Status: DC | PRN

## 2019-10-11 MED ORDER — INSULIN DETEMIR 100 UNIT/ML ~~LOC~~ SOLN: 5.0000 [IU] | Freq: Two times a day (BID) | SUBCUTANEOUS | 0 refills | Status: DC

## 2019-10-11 MED ORDER — METOPROLOL SUCCINATE ER 25 MG PO TB24
25.0000 mg | ORAL_TABLET | Freq: Every evening | ORAL | Status: DC
  Administered 2019-10-12: 25 mg via ORAL
  Filled 2019-10-11: qty 1

## 2019-10-11 MED ORDER — RIVAROXABAN 20 MG PO TABS: 20.0000 mg | ORAL_TABLET | Freq: Every day | ORAL | 0 refills | Status: DC

## 2019-10-11 MED ORDER — ATORVASTATIN CALCIUM 80 MG PO TABS
80.0000 mg | ORAL_TABLET | Freq: Every day | ORAL | Status: DC
  Administered 2019-10-11 – 2019-10-17 (×7): 80 mg via ORAL
  Filled 2019-10-11 (×7): qty 1

## 2019-10-11 MED ORDER — POLYETHYLENE GLYCOL 3350 17 G PO PACK
17.0000 g | PACK | Freq: Every day | ORAL | Status: DC | PRN
  Administered 2019-10-13: 17 g via ORAL

## 2019-10-11 MED ORDER — ACETAMINOPHEN 325 MG PO TABS: 650.0000 mg | ORAL_TABLET | Freq: Four times a day (QID) | ORAL | Status: DC | PRN

## 2019-10-11 MED ORDER — ATORVASTATIN CALCIUM 80 MG PO TABS: 80.0000 mg | ORAL_TABLET | Freq: Every day | ORAL | 0 refills | Status: DC

## 2019-10-11 MED ORDER — METOPROLOL SUCCINATE ER 25 MG PO TB24: 25.0000 mg | ORAL_TABLET | Freq: Every evening | ORAL | 0 refills | Status: DC

## 2019-10-11 MED ORDER — INSULIN ASPART 100 UNIT/ML ~~LOC~~ SOLN
0.0000 [IU] | Freq: Three times a day (TID) | SUBCUTANEOUS | Status: DC
  Administered 2019-10-12 – 2019-10-13 (×6): 3 [IU] via SUBCUTANEOUS
  Administered 2019-10-14: 11 [IU] via SUBCUTANEOUS
  Administered 2019-10-14 (×2): 2 [IU] via SUBCUTANEOUS
  Administered 2019-10-15 – 2019-10-16 (×4): 3 [IU] via SUBCUTANEOUS
  Administered 2019-10-16 – 2019-10-17 (×4): 2 [IU] via SUBCUTANEOUS
  Administered 2019-10-18: 3 [IU] via SUBCUTANEOUS

## 2019-10-11 MED ORDER — AMLODIPINE BESYLATE 10 MG PO TABS: 10.0000 mg | ORAL_TABLET | Freq: Every evening | ORAL | 0 refills | Status: DC

## 2019-10-11 MED ORDER — SORBITOL 70 % SOLN
30.0000 mL | Freq: Every day | Status: DC | PRN
  Administered 2019-10-14 – 2019-10-18 (×2): 30 mL via ORAL
  Filled 2019-10-11 (×2): qty 30

## 2019-10-11 MED ORDER — POLYETHYLENE GLYCOL 3350 17 G PO PACK: 17.0000 g | PACK | Freq: Every day | ORAL | 0 refills | Status: DC | PRN

## 2019-10-11 MED ORDER — DEXAMETHASONE 4 MG PO TABS: 4.0000 mg | ORAL_TABLET | Freq: Four times a day (QID) | ORAL | 0 refills | Status: DC

## 2019-10-11 MED ORDER — ACETAMINOPHEN 650 MG RE SUPP: 650.0000 mg | Freq: Four times a day (QID) | RECTAL | Status: DC | PRN

## 2019-10-11 MED ORDER — DEXAMETHASONE 4 MG PO TABS
4.0000 mg | ORAL_TABLET | Freq: Four times a day (QID) | ORAL | Status: DC
  Administered 2019-10-12 – 2019-10-16 (×18): 4 mg via ORAL
  Filled 2019-10-11 (×18): qty 1

## 2019-10-11 MED ORDER — INSULIN DETEMIR 100 UNIT/ML ~~LOC~~ SOLN
5.0000 [IU] | Freq: Two times a day (BID) | SUBCUTANEOUS | Status: DC
  Administered 2019-10-11 – 2019-10-13 (×4): 5 [IU] via SUBCUTANEOUS
  Filled 2019-10-11 (×5): qty 0.05

## 2019-10-11 MED ORDER — AMLODIPINE BESYLATE 10 MG PO TABS
10.0000 mg | ORAL_TABLET | Freq: Every evening | ORAL | Status: DC
  Administered 2019-10-12 – 2019-10-17 (×6): 10 mg via ORAL
  Filled 2019-10-11 (×6): qty 1

## 2019-10-11 MED ORDER — INSULIN ASPART 100 UNIT/ML ~~LOC~~ SOLN: 0.0000 [IU] | Freq: Every day | SUBCUTANEOUS | 0 refills | Status: DC

## 2019-10-11 MED ORDER — RIVAROXABAN 20 MG PO TABS
20.0000 mg | ORAL_TABLET | Freq: Every day | ORAL | Status: DC
  Administered 2019-10-14 – 2019-10-17 (×4): 20 mg via ORAL
  Filled 2019-10-11 (×6): qty 1

## 2019-10-11 NOTE — Discharge Summary (Signed)
Physician Discharge Summary  [name] QMV:[id number] DOB: Mar 23, 1946 DOA: 10/08/2019  PCP: Maurice Small, MD  Admit date: 10/08/2019 Discharge date: 10/11/2019  Time spent: 30 minutes  Recommendations for Outpatient Follow-up:   Worsening left-sided weakness, severe headache with nausea and vomiting due to right occipital glioblastoma multiforme he leading to new Vasogenic edema ([place]): -CT showed new midline shift. -Decadron 4 mg QID -Keppra 500 mg BID. -Palliative Care was consulted he was continue to be full code. Patient to follow-up with Oncology once discharged from CIR Dr. Marijean Niemann hematuria: -Resolved most likely traumatic -Resume Xarelto 20 mg daily  Chronic diastolic heart failure: -Amlodipine 10 mg daily -Toprol 25 mg daily -Hold HCTZ and losartan  Essential HTN -See CHF  Diabetes mellitus without complication (HCC) type II: -Levemir 5 units  BID -Moderate SSI .   Discharge Diagnoses:  Principal Problem:   Vasogenic edema (HCC) Active Problems:   Diabetes mellitus without complication ([place])   Essential hypertension   DNR (do not resuscitate) discussion   Left-sided weakness   Gross hematuria   Palliative care by specialist   Discharge Condition: Guarded  Diet recommendation: Heart healthy  Filed Weights   10/08/19 1856  Weight: 99.5 kg    History of present illness:  74 y.o.WM PMHx RIGHT occipital glioblastoma status post debulking resection in March 2010followed by chemo and radiation, prostate cancer s/p XRT , CAD, Chronic Diastolic CHF, HTN, HLD,diet-controlledtype 2 diabetes, OSA/OHS not on CPAP, morbid obesity,  Presenting with complaints of severe headache, nausea, vomiting, and worsening left-sided weakness. History provided by patient and his wife at bedside. He has had left-sided weakness due to his brain tumor but it has been getting progressively worse. He has been very weak and unsteady when walking. For the  past 1 day he has been complaining of severe headaches and has been vomiting. Dose of his home Decadron is currently being tapered down. Wife states he finished chemo and radiation on June 10 and plan was to follow-up with [name] in July. States yesterday patient had blood in his urine. This has happened to him in the past as he is on Xarelto. Today his urine has been clear. Patient denies flank pain, abdominal pain, or dysuria. Denies history of kidney stones. Wife states Xarelto was started back in March at the time of his brain surgery and they were told he had a clot in his brain. States last time he had blood in his urine, [name] had held his Xarelto but after his urine was clear for a week it was resumed. No other complaints. Patient has been vaccinated for Covid. Denies cough, shortness of breath, or chest pain. Denies abdominal pain or diarrhea.  ED Course:Vital signs stable. Labs showing no leukocytosis. Hemoglobin 12.7, no significant change from baseline. Platelet count normal. Corrected sodium 133, stable compared to prior labs.   Head CT showing new severe vasogenic edema throughout the posterior right cerebral hemisphere with 8 mm of leftward midline shift. No evidence of acute intracranial abnormality.  Patient received IV Decadron 10 mg, morphine, and Zofran.  No neuro-oncology available at night. ED provider discussed the case with Dr. Christella Noa from neurosurgery -no additional recommendations.  Hospital Course:  See above   Consultations: Palliative care  NP Wadie Lessen     Discharge Exam: Vitals:   10/11/19 0400 10/11/19 0730 10/11/19 0800 10/11/19 1248  BP: 133/75  (!) 97/52   Pulse: 61 63 67   Resp: 11 18 14  Temp: 98.5 F (36.9 C)  97.6 F (36.4 C) 97.6 F (36.4 C)  TempSrc: Oral  Oral Oral  SpO2: 95% 96% 94%   Weight:      Height:        General exam: In no acute distress. Respiratory system: Good air movement and clear to  auscultation. Cardiovascular system: S1 & S2 heard, RRR. No JVD. Gastrointestinal system: Abdomen is nondistended, soft and nontender.  Extremities: No pedal edema. Skin: No rashes, lesions or ulcers Psychiatry: Judgement and insight appear normal. Mood & affect appropriate.   Discharge Instructions   Allergies as of 10/11/2019      Reactions   Lisinopril Cough      Medication List    STOP taking these medications   LORazepam 0.5 MG tablet Commonly known as: Ativan   losartan-hydrochlorothiazide 100-12.5 MG tablet Commonly known as: HYZAAR     TAKE these medications   acetaminophen 325 MG tablet Commonly known as: TYLENOL Take 2 tablets (650 mg total) by mouth every 6 (six) hours as needed for mild pain (or Fever >/= 101). What changed:   medication strength  how much to take  reasons to take this   amLODipine 10 MG tablet Commonly known as: NORVASC Take 1 tablet (10 mg total) by mouth every evening.   atorvastatin 80 MG tablet Commonly known as: LIPITOR Take 1 tablet (80 mg total) by mouth at bedtime. What changed: See the new instructions.   dexamethasone 4 MG tablet Commonly known as: DECADRON Take 1 tablet (4 mg total) by mouth every 6 (six) hours. What changed:   medication strength  how much to take  when to take this   insulin aspart 100 UNIT/ML injection Commonly known as: novoLOG Inject 0-15 Units into the skin 3 (three) times daily with meals.   insulin aspart 100 UNIT/ML injection Commonly known as: novoLOG Inject 0-5 Units into the skin at bedtime.   insulin detemir 100 UNIT/ML injection Commonly known as: LEVEMIR Inject 0.05 mLs (5 Units total) into the skin 2 (two) times daily.   levETIRAcetam 500 MG tablet Commonly known as: KEPPRA Take 1 tablet (500 mg total) by mouth 2 (two) times daily.   metoprolol succinate 25 MG 24 hr tablet Commonly known as: TOPROL-XL Take 1 tablet (25 mg total) by mouth every evening.   ondansetron 4  MG tablet Commonly known as: Zofran Take 1 tablet (4 mg total) by mouth every 8 (eight) hours as needed for nausea or vomiting.   polyethylene glycol 17 g packet Commonly known as: MIRALAX / GLYCOLAX Take 17 g by mouth daily as needed (constipation).   rivaroxaban 20 MG Tabs tablet Commonly known as: XARELTO Take 1 tablet (20 mg total) by mouth daily with supper.      Allergies  Allergen Reactions  . Lisinopril Cough      The results of significant diagnostics from this hospitalization (including imaging, microbiology, ancillary and laboratory) are listed below for reference.    Significant Diagnostic Studies: CT Head Wo Contrast  Result Date: 10/08/2019 CLINICAL DATA:  History of glioblastoma. Left arm weakness. Headache. Nausea. EXAM: CT HEAD WITHOUT CONTRAST TECHNIQUE: Contiguous axial images were obtained from the base of the skull through the vertex without intravenous contrast. COMPARISON:  Head MRI 07/22/2019 FINDINGS: Brain: Sequelae of right occipital craniotomy for glioblastoma debulking are again identified. There is minimal residual gas in the operative bed. There is new severe vasogenic edema extending from the right occipital lobe throughout the right temporal lobe,  white matter tracts posterior to the basal ganglia, into the splenium of the corpus callosum, and into the right parietal lobe with regional sulcal effacement, subtotal effacement of the right lateral ventricle, and 8 mm of leftward midline shift. The left lateral ventricle is stable to at most minimally larger than on the prior MRI without evidence of frank hydrocephalus. Curvilinear hyperdensity along the superior aspect of and posterior to the third ventricle has density favoring calcification over hemorrhage, and no acute intracranial hemorrhage is evident elsewhere. There is an unchanged 1 cm dense calcification along the superomedial aspect of the left tentorium. No acute large territory cortically based  infarct or extra-axial fluid collection is identified. Vascular: Calcified atherosclerosis at the skull base. Skull: Right occipital craniotomy. Sinuses/Orbits: Paranasal sinuses and mastoid air cells are clear. Unremarkable orbits. Other: None. IMPRESSION: 1. New severe vasogenic edema throughout the posterior right cerebral hemisphere with 8 mm of leftward midline shift. 2. No evidence of acute intracranial abnormality. Electronically Signed   By: Logan Bores M.D.   On: 10/08/2019 20:20    Microbiology: Recent Results (from the past 240 hour(s))  SARS Coronavirus 2 by RT PCR (hospital order, performed in [facility] hospital lab) Nasopharyngeal Nasopharyngeal Swab     Status: None   Collection Time: 10/08/19  7:24 PM   Specimen: Nasopharyngeal Swab  Result Value Ref Range Status   SARS Coronavirus 2 NEGATIVE NEGATIVE Final    Comment: (NOTE) SARS-CoV-2 target nucleic acids are NOT DETECTED.  The SARS-CoV-2 RNA is generally detectable in upper and lower respiratory specimens during the acute phase of infection. The lowest concentration of SARS-CoV-2 viral copies this assay can detect is 250 copies / mL. A negative result does not preclude SARS-CoV-2 infection and should not be used as the sole basis for treatment or other patient management decisions.  A negative result may occur with improper specimen collection / handling, submission of specimen other than nasopharyngeal swab, presence of viral mutation(s) within the areas targeted by this assay, and inadequate number of viral copies (<250 copies / mL). A negative result must be combined with clinical observations, patient history, and epidemiological information.  Fact Sheet for Patients:   StrictlyIdeas.no  Fact Sheet for Healthcare Providers: BankingDealers.co.za  This test is not yet approved or  cleared by the [place] FDA and has been authorized for detection and/or  diagnosis of SARS-CoV-2 by FDA under an Emergency Use Authorization (EUA).  This EUA will remain in effect (meaning this test can be used) for the duration of the COVID-19 declaration under Section 564(b)(1) of the Act, 21 U.S.C. section 360bbb-3(b)(1), unless the authorization is terminated or revoked sooner.  Performed at [facility], [place] [address], [place], [place] [address]      Labs: Basic Metabolic Panel: Recent Labs  Lab 10/08/19 1924 10/11/19 0928  NA 132* 131*  K 3.8 4.5  CL 96* 94*  CO2 26 29  GLUCOSE 171* 227*  BUN 16 26*  CREATININE 0.88 0.92  CALCIUM 8.9 8.8*  MG  --  1.8  PHOS  --  4.0   Liver Function Tests: Recent Labs  Lab 10/08/19 1924 10/11/19 0928  AST 20 17  ALT 29 27  ALKPHOS 73 60  BILITOT 1.5* 1.2  PROT 6.2* 5.9*  ALBUMIN 3.5 3.2*   No results for input(s): LIPASE, AMYLASE in the last 168 hours. No results for input(s): AMMONIA in the last 168 hours. CBC: Recent Labs  Lab 10/08/19 1924 10/11/19 8416  WBC 5.2 7.0  NEUTROABS 4.3 6.6  HGB 12.7* 12.8*  HCT 37.8* 37.6*  MCV 86.5 84.9  PLT 151 169   Cardiac Enzymes: No results for input(s): CKTOTAL, CKMB, CKMBINDEX, TROPONINI in the last 168 hours. BNP: BNP (last 3 results) No results for input(s): BNP in the last 8760 hours.  ProBNP (last 3 results) No results for input(s): PROBNP in the last 8760 hours.  CBG: Recent Labs  Lab 10/10/19 1122 10/10/19 1552 10/10/19 2100 10/11/19 0729 10/11/19 1133  GLUCAP 193* 133* 178* 156* 186*       Signed:  Dia Crawford, MD [facility] [phone] pager

## 2019-10-11 NOTE — Progress Notes (Signed)
Inpatient Rehabilitation  Patient information reviewed and entered into eRehab system by Arcangel Minion M. Catherina Pates, M.A., CCC/SLP, PPS Coordinator.  Information including medical coding, functional ability and quality indicators will be reviewed and updated through discharge.    

## 2019-10-11 NOTE — Plan of Care (Signed)
Care plan has been updated.  

## 2019-10-11 NOTE — H&P (Signed)
Physical Medicine and Rehabilitation Admission H&P    Chief Complaint  Patient presents with  . 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, prostate cancer status post radiation as well as prostatectomy, CAD with CABG 2010 maintained on Xarelto, chronic diastolic congestive heart failure, type 2 diabetes mellitus, hypertension, hyperlipidemia, sleep apnea not on CPAP.  History taken from chart review, patient, and wife.  Patient lives with spouse.  Two-level home bed and bath upstairs.  He had been receiving outpatient therapies at neuro rehab.  He presented on 09/2019 with increasing left hemiparesis, headache, nausea vomiting as well as bouts of hematuria.  Admission chemistries with sodium 132, glucose 171, creatinine 0.88, hemoglobin 12.7, urinalysis negative nitrite.  CT of the head showed new severe vasogenic edema throughout the posterior right cerebral hemisphere with 8 mm leftward midline shift.  No evidence of acute intracranial abnormality.  Follow-up medical oncology services maintained on Decadron therapy.  Palliative care consulted for goals of care.  Patient remains on [place] for seizure prophylaxis.  Patient's chronic Xarelto had initially been held due to some hematuria since resumed.  Therapy evaluations completed and patient was admitted for a comprehensive rehab program.  Please see preadmission assessment from earlier today as well.  Review of Systems  Constitutional: Negative for chills and fever.  HENT: Negative for hearing loss.   Eyes: Negative for blurred vision and double vision.  Respiratory: Positive for shortness of breath. Negative for cough.   Cardiovascular: Negative for chest pain.  Gastrointestinal: Negative for abdominal pain,  heartburn, nausea and vomiting.  Genitourinary: Negative for dysuria, flank pain and hematuria.  Musculoskeletal: Positive for myalgias.  Skin: Negative for rash.  Neurological: Positive for weakness and headaches.  Psychiatric/Behavioral: Positive for depression. The patient has insomnia.        Anxiety  All other systems reviewed and are negative.  Past Medical History:  Diagnosis Date  . Anxiety   . Arthritis   . ASCVD (arteriosclerotic cardiovascular disease)    03/2004: DES x2 to the RCA; IMI with stent occlusion in 02/2005- suboptimal Plavix compliance  . CAD (coronary artery disease)   . Carcinoma of prostate ([place])    Clopidogrel held for biopsy  . Constipation due to pain medication   . Diabetes mellitus without complication ([place])   . Diabetic neuropathy ([place])   . Headache 2021  . Hyperlipidemia   . Hypertension   . Mild depression ([place])   . Morbid obesity ([place])   . Myocardial infarction ([place])   . Shortness of breath dyspnea   . Sleep apnea    no longer uses cpap  . Stroke ([place]) 2017  . Tobacco abuse    stopped smoking 06/28/09   Past Surgical History:  Procedure Laterality Date  . APPLICATION OF CRANIAL NAVIGATION N/A 07/04/2019   Procedure: APPLICATION OF CRANIAL NAVIGATION;  Surgeon: Ashok Pall, MD;  Location: [facility];  Service: Neurosurgery;  Laterality: N/A;  . BACK SURGERY    . CARDIAC CATHETERIZATION     X 1 stent before having CABG  . CORONARY ARTERY BYPASS GRAFT  06/28/2008   X4  . CRANIOTOMY Right 07/04/2019   Procedure: Right occipital craniotomy for tumor with brainlab;  Surgeon: Ashok Pall, MD;  Location: [facility];  Service: Neurosurgery;  Laterality: Right;  . ENDARTERECTOMY Right  07/03/2015   Procedure: RIGHT CAROTID ENDARTERECTOMY WITH BOVINE PERICARDIUM PATCH ANGIOPLASTY;  Surgeon: Serafina Mitchell, MD;  Location: [facility];  Service: Vascular;  Laterality: Right;  . LUMBAR LAMINECTOMY/DECOMPRESSION MICRODISCECTOMY N/A 09/06/2014   Procedure: LUMBER  DECOMPRESSION L3-5;  Surgeon: Melina Schools, MD;  Location: [facility];  Service: Orthopedics;  Laterality: N/A;  . LUMBAR SPINE SURGERY  2002  . PROSTATE BIOPSY    . PROSTATECTOMY  02/2007  . VASECTOMY     Family History  Problem Relation Age of Onset  . Kidney disease Mother   . Stroke Father   . Breast cancer Sister        breast progressed into bone  . Colon cancer Neg Hx   . Prostate cancer Neg Hx   . Pancreatic cancer Neg Hx    Social History:  reports that he quit smoking about 11 years ago. His smoking use included cigarettes. He has a 40.00 pack-year smoking history. He quit smokeless tobacco use about 10 years ago. He reports current alcohol use of about 3.0 standard drinks of alcohol per week. He reports that he does not use drugs. Allergies:  Allergies  Allergen Reactions  . Lisinopril Cough   Medications Prior to Admission  Medication Sig Dispense Refill  . acetaminophen (TYLENOL) 500 MG tablet Take 1,000 mg by mouth every 6 (six) hours as needed for headache (pain).    Marland Kitchen amLODipine (NORVASC) 10 MG tablet TAKE 1 TABLET BY MOUTH DAILY (Patient taking differently: Take 10 mg by mouth every evening. ) 90 tablet 0  . atorvastatin (LIPITOR) 80 MG tablet TAKE 1 TABLET BY MOUTH DAILY AT 6 PM (Patient taking differently: Take 80 mg by mouth at bedtime. ) 90 tablet 1  . dexamethasone (DECADRON) 2 MG tablet Take 1 tablet (2 mg total) by mouth daily. (Patient taking differently: Take 3 mg by mouth See admin instructions. Take 1 1/2 tablets (3 mg) by mouth daily, taper down to 1 tablet (2 mg) daily on 10/12/2019, then call [name] for further instructions) 30 tablet 0  . levETIRAcetam (KEPPRA) 500 MG tablet Take 1 tablet (500 mg total) by mouth 2 (two) times daily. 60 tablet 3  . losartan-hydrochlorothiazide (HYZAAR) 100-12.5 MG tablet Take 1 tablet by mouth daily. (Patient taking differently: Take 1 tablet by mouth every evening. ) 90 tablet 2  . metoprolol succinate (TOPROL-XL) 25 MG 24  hr tablet TAKE 1 TABLET BY MOUTH DAILY (Patient taking differently: Take 25 mg by mouth every evening. ) 90 tablet 1  . polyethylene glycol (MIRALAX / GLYCOLAX) 17 g packet Take 17 g by mouth daily as needed (constipation).     . LORazepam (ATIVAN) 0.5 MG tablet 1 tab po q 4-6 hours prn or 1 tab po 30 minutes prior to MRI or radiation (Patient not taking: Reported on 09/26/2019) 30 tablet 0  . rivaroxaban (XARELTO) 20 MG TABS tablet Take 1 tablet (20 mg total) by mouth daily with supper. 30 tablet 3    Drug Regimen Review Drug regimen was reviewed and remains appropriate with no significant issues identified  Home: Home Living Family/patient expects to be discharged to:: Private residence Living Arrangements: Spouse/significant other Available Help at Discharge: Family, Available 24 hours/day Type of Home: House Home Access: Stairs to enter [employer] of Steps: 2 Entrance Stairs-Rails: Right Home Layout: Two level, Bed/bath upstairs Alternate Level Stairs-Number of Steps: 13 Alternate Level Stairs-Rails: Right, Left, Can reach both Bathroom Shower/Tub: Walk-in shower, Curtain Bathroom Toilet: Handicapped height Bathroom Accessibility:  Yes Home Equipment: Cane - single point   Functional History: Prior Function Level of Independence: Needs assistance Gait / Transfers Assistance Needed: getting OPPT at neurorehab working on balance; using cane; had a fall last week when slipped off edge of chair ADL's / Homemaking Assistance Needed: needed assist with dressing due to vision loss (going to OPOT at neurorehab) Comments: just prior to admision, family lowered him to the floor in the bathroom  Functional Status:  Mobility: Bed Mobility Overal bed mobility: Needs Assistance Bed Mobility: Supine to Sit Supine to sit: HOB elevated, Min guard (with rail) General bed mobility comments: OOB in chair Transfers Overall transfer level: Needs assistance Equipment used: Rolling  walker (2 wheeled) Transfers: Sit to/from Stand Sit to Stand: Min guard Stand pivot transfers: Min assist General transfer comment: max cues for sequencing with RW; from bed and from recliner x 2 Ambulation/Gait Ambulation/Gait assistance: +2 safety/equipment, Mod assist (close follow with recliner) Gait Distance (Feet): 20 Feet (seated rest; 25) Assistive device: Rolling walker (2 wheeled) Gait Pattern/deviations: Step-to pattern, Decreased dorsiflexion - left, Drifts right/left General Gait Details: despite cues, continued to advance RLE slightly past LLEand take very small steps with LLE; with fatigue, noted left genu recurvatum; tended to drift to the left inside the RW with left foot against back leg of RW    ADL: ADL Overall ADL's : Needs assistance/impaired Eating/Feeding: Set up, Sitting Grooming: Set up, Sitting Upper Body Bathing: Minimal assistance, Sitting Lower Body Bathing: Moderate assistance, Sit to/from stand Upper Body Dressing : Minimal assistance, Sitting Lower Body Dressing: Moderate assistance, Sit to/from stand Toilet Transfer: Minimal assistance, RW, BSC Functional mobility during ADLs: Minimal assistance, Rolling walker General ADL Comments: Requires increased time and cueing for moving L UE.  Cognition: Cognition Overall Cognitive Status: Impaired/Different from baseline Orientation Level: Oriented X4 Cognition Arousal/Alertness: Awake/alert Behavior During Therapy: WFL for tasks assessed/performed Overall Cognitive Status: Impaired/Different from baseline Area of Impairment: Awareness, Safety/judgement, Problem solving Safety/Judgement: Decreased awareness of safety, Decreased awareness of deficits Awareness: Emergent Problem Solving: Difficulty sequencing, Requires verbal cues General Comments: Verbals cues to attend to L side (putting left hand on RW; advancing LLE first).  Increased processing time.  Physical Exam: Blood pressure (!) 97/52, pulse  67, temperature 97.6 F (36.4 C), temperature source Oral, resp. rate 14, height 6\' 2"  (1.88 m), weight 99.5 kg, SpO2 94 %. Physical Exam  Vitals reviewed. Constitutional: No distress.  HENT:  Head: Normocephalic and atraumatic.  Right Ear: External ear normal.  Left Ear: External ear normal.  Nose: Nose normal.  Eyes: Right eye exhibits no discharge. Left eye exhibits no discharge.  Respiratory: Effort normal. No stridor. No respiratory distress.  GI: Normal appearance. He exhibits no distension.  Musculoskeletal:     Cervical back: Normal range of motion.     Comments: No edema or tenderness in extremities  Neurological: He is alert.  Alert NAD Makes good eye contact with examiner and follows commands Motor: RUE: 5/5 proximal distal LUE: 4+/5 proximal distal RLE: 4+/5 proximal distal LLE: 4-4+/5 proximal to distal  Skin: Skin is warm and dry.  Psychiatric: His behavior is normal. Mood and thought content normal.    Results for orders placed or performed during the hospital encounter of 10/08/19 (from the past 48 hour(s))  Glucose, capillary     Status: Abnormal   Collection Time: 10/09/19 11:33 AM  Result Value Ref Range   Glucose-Capillary 216 (H) 70 - 99 mg/dL    Comment: Glucose reference range  applies only to samples taken after fasting for at least 8 hours.  Glucose, capillary     Status: Abnormal   Collection Time: 10/09/19  4:05 PM  Result Value Ref Range   Glucose-Capillary 184 (H) 70 - 99 mg/dL    Comment: Glucose reference range applies only to samples taken after fasting for at least 8 hours.  Glucose, capillary     Status: Abnormal   Collection Time: 10/09/19  9:13 PM  Result Value Ref Range   Glucose-Capillary 172 (H) 70 - 99 mg/dL    Comment: Glucose reference range applies only to samples taken after fasting for at least 8 hours.  Glucose, capillary     Status: Abnormal   Collection Time: 10/10/19  7:50 AM  Result Value Ref Range   Glucose-Capillary 147  (H) 70 - 99 mg/dL    Comment: Glucose reference range applies only to samples taken after fasting for at least 8 hours.  Glucose, capillary     Status: Abnormal   Collection Time: 10/10/19 11:22 AM  Result Value Ref Range   Glucose-Capillary 193 (H) 70 - 99 mg/dL    Comment: Glucose reference range applies only to samples taken after fasting for at least 8 hours.  Glucose, capillary     Status: Abnormal   Collection Time: 10/10/19  3:52 PM  Result Value Ref Range   Glucose-Capillary 133 (H) 70 - 99 mg/dL    Comment: Glucose reference range applies only to samples taken after fasting for at least 8 hours.  Glucose, capillary     Status: Abnormal   Collection Time: 10/10/19  9:00 PM  Result Value Ref Range   Glucose-Capillary 178 (H) 70 - 99 mg/dL    Comment: Glucose reference range applies only to samples taken after fasting for at least 8 hours.  Glucose, capillary     Status: Abnormal   Collection Time: 10/11/19  7:29 AM  Result Value Ref Range   Glucose-Capillary 156 (H) 70 - 99 mg/dL    Comment: Glucose reference range applies only to samples taken after fasting for at least 8 hours.   No results found.     Medical Problem List and Plan: 1.  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  CONTINUE DECADRON 4 MG BID ON DISCHARGE.Marland Kitchen  Plan outpatient MRI with medical oncology  -patient may shower  -ELOS/Goals: 9--13 days/supervision  Admit to CIR 2.  Antithrombotics: -DVT/anticoagulation: Xarelto  -antiplatelet therapy: N/A 3. Pain Management: Tylenol as needed 4. Mood: 5 emotional support  -antipsychotic agents: N/A 5. Neuropsych: This patient is capable of making decisions on his own behalf. 6. Skin/Wound Care: Routine skin checks 7. Fluids/Electrolytes/Nutrition: Routine in and outs.  CMP ordered. 8.  Chronic  diastolic congestive heart failure.  Monitor for any signs of fluid overload  Daily weights 9.  Diabetes mellitus with peripheral neuropathy.  Hemoglobin A1c 6.9.  Levemir 5 units twice daily.  Monitor while on Decadron therapy  Monitor with increased mobility 10.  Seizure prophylaxis.  Keppra 500 mg twice daily.  No seizure activity 11.  Hypertension.  Toprol-XL 25 mg daily, Norvasc 10 mg daily.    Monitor with increased mobility 12.  Hyperlipidemia.  Lipitor 13.  History of prostate cancer with hematuria.  Monitor while on Xarelto 14.  CAD/CABG 2010.  Continue Xarelto.  No chest pain or shortness of breath  Quillian Quince  J Angiulli, PA-C 10/11/2019  I have personally performed a face to face diagnostic evaluation, including, but not limited to relevant history and physical exam findings, of this patient and developed relevant assessment and plan.  Additionally, I have reviewed and concur with the physician assistant's documentation above.  Delice Lesch, MD, ABPMR

## 2019-10-11 NOTE — Progress Notes (Signed)
Inpatient Rehabilitation Medication Review by a Pharmacist  A complete drug regimen review was completed for this patient to identify any potential clinically significant medication issues.  Clinically significant medication issues were identified:  no  Name of provider notified for urgent issues identified:   Provider Method of Notification:    For non-urgent medication issues to be resolved on team rounds tomorrow morning a [facility] was sent to:    Pharmacist comments:   Time spent performing this drug regimen review (minutes):  5 minutes   [place] 10/11/2019 5:58 PM

## 2019-10-11 NOTE — Progress Notes (Signed)
Inpatient Rehab Admissions Coordinator:   I have a bed available and approval from Dr. Sherral Hammers for pt to admit to CIR today.  Will let pt/family and TOC team know.   Shann Medal, PT, DPT Admissions Coordinator [phone] 10/11/19  1:15 PM

## 2019-10-11 NOTE — Plan of Care (Signed)
  Problem: Consults Goal: RH GENERAL PATIENT EDUCATION Description: Patient to be knowledgeable of his care plan and to be educated about his disease process.  Outcome: Progressing Goal: Skin Care Protocol Initiated - if Braden Score 18 or less Description: If consults are not indicated, leave blank or document N/A Outcome: Progressing Goal: Nutrition Consult-if indicated Outcome: Progressing Goal: Diabetes Guidelines if Diabetic/Glucose > 140 Description: If diabetic or lab glucose is > 140 mg/dl - Initiate Diabetes/Hyperglycemia Guidelines & Document Interventions  Outcome: Progressing   Problem: RH BOWEL ELIMINATION Goal: RH STG MANAGE BOWEL WITH ASSISTANCE Description: STG Manage Bowel with Assistance. Outcome: Progressing Goal: RH STG MANAGE BOWEL W/MEDICATION W/ASSISTANCE Description: STG Manage Bowel with Medication with Assistance. Outcome: Progressing   Problem: RH BLADDER ELIMINATION Goal: RH STG MANAGE BLADDER WITH ASSISTANCE Description: STG Manage Bladder With Assistance Outcome: Progressing Goal: RH STG MANAGE BLADDER WITH MEDICATION WITH ASSISTANCE Description: STG Manage Bladder With Medication With Assistance. Outcome: Progressing Goal: RH STG MANAGE BLADDER WITH EQUIPMENT WITH ASSISTANCE Description: STG Manage Bladder With Equipment With Assistance Outcome: Progressing   Problem: RH SKIN INTEGRITY Goal: RH STG SKIN FREE OF INFECTION/BREAKDOWN Outcome: Progressing Goal: RH STG MAINTAIN SKIN INTEGRITY WITH ASSISTANCE Description: STG Maintain Skin Integrity With Assistance. Outcome: Progressing Goal: RH STG ABLE TO PERFORM INCISION/WOUND CARE W/ASSISTANCE Description: STG Able To Perform Incision/Wound Care With Assistance. Outcome: Progressing   Problem: RH SAFETY Goal: RH STG ADHERE TO SAFETY PRECAUTIONS W/ASSISTANCE/DEVICE Description: STG Adhere to Safety Precautions With Assistance/Device. Outcome: Progressing Goal: RH STG DECREASED RISK OF FALL  WITH ASSISTANCE Description: STG Decreased Risk of Fall With Assistance. Outcome: Progressing   Problem: RH PAIN MANAGEMENT Goal: RH STG PAIN MANAGED AT OR BELOW PT'S PAIN GOAL Outcome: Progressing   Problem: RH KNOWLEDGE DEFICIT GENERAL Goal: RH STG INCREASE KNOWLEDGE OF SELF CARE AFTER HOSPITALIZATION Outcome: Progressing   Problem: RH Vision Goal: RH LTG Vision (Specify) Outcome: Progressing   Problem: RH Pre-functional/Other (Specify) Goal: RH LTG Pre-functional (Specify) Outcome: Progressing Goal: RH LTG Interdisciplinary (Specify) 1 Description: RH LTG Interdisciplinary (Specify)1 Outcome: Progressing Goal: RH LTG Interdisciplinary (Specify) 2 Description: RH LTG Interdisciplinary (Specify) 2  Outcome: Progressing

## 2019-10-11 NOTE — Progress Notes (Signed)
Physical Medicine and Rehabilitation Consult Reason for Consult: Left side weakness Referring Physician: [facility]     HPI: [name] is a 74 y.o. 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  Per chart review patient lives with spouse.  Two-level home bed and bath upstairs.  He had been receiving outpatient therapy at neuro rehab.  Presented 10/08/2019 with increasing left-sided weakness, headache, nausea and vomiting as well as bouts of hematuria.  CT the head showed new severe vasogenic edema throughout the posterior right cerebral hemisphere with a 8 mm of leftward midline shift.  No evidence of acute intracranial abnormality.  Follow-up medical oncology maintained on Decadron therapy.  Palliative care consulted for goals of care.  Patient remains on [place] for seizure prophylaxis.  His Xarelto is currently on hold due to hematuria.  Therapy evaluations completed with recommendations of physical medicine rehab consult.     Review of Systems  Constitutional: Negative for chills and fever.  HENT: Negative for hearing loss.   Eyes: Negative for blurred vision and double vision.  Respiratory: Positive for shortness of breath. Negative for cough.   Cardiovascular: Positive for palpitations and leg swelling. Negative for chest pain.  Gastrointestinal: Positive for nausea and vomiting.  Genitourinary: Negative for dysuria, flank pain and hematuria.  Musculoskeletal: Positive for myalgias.  Skin: Negative for rash.  Neurological: Positive for seizures, weakness and headaches.  Psychiatric/Behavioral: Positive for depression. The patient has  insomnia.        Anxiety  All other systems reviewed and are negative.       Past Medical History:  Diagnosis Date  . Anxiety    . Arthritis    . ASCVD (arteriosclerotic cardiovascular disease)      03/2004: DES x2 to the RCA; IMI with stent occlusion in 02/2005- suboptimal Plavix compliance  . CAD (coronary artery disease)    . Carcinoma of prostate ([place])      Clopidogrel held for biopsy  . Constipation due to pain medication    . Diabetes mellitus without complication ([place])    . Diabetic neuropathy ([place])    . Headache 2021  . Hyperlipidemia    . Hypertension    . Mild depression ([place])    . Morbid obesity ([place])    . Myocardial infarction ([place])    . Shortness of breath dyspnea    . Sleep apnea      no longer uses cpap  . Stroke ([place]) 2017  . Tobacco abuse      stopped smoking 06/28/09         Past Surgical History:  Procedure Laterality Date  . APPLICATION OF CRANIAL NAVIGATION N/A 07/04/2019    Procedure: APPLICATION OF CRANIAL NAVIGATION;  Surgeon: Ashok Pall, MD;  Location: [facility];  Service: Neurosurgery;  Laterality: N/A;  . BACK SURGERY      . CARDIAC CATHETERIZATION        X 1 stent before having CABG  . CORONARY ARTERY BYPASS GRAFT   06/28/2008    X4  . CRANIOTOMY Right 07/04/2019    Procedure: Right occipital craniotomy for tumor with brainlab;  Surgeon: Ashok Pall, MD;  Location: [facility];  Service: Neurosurgery;  Laterality: Right;  .  ENDARTERECTOMY Right 07/03/2015    Procedure: RIGHT CAROTID ENDARTERECTOMY WITH BOVINE PERICARDIUM PATCH ANGIOPLASTY;  Surgeon: Serafina Mitchell, MD;  Location: [facility];  Service: Vascular;  Laterality: Right;  . LUMBAR LAMINECTOMY/DECOMPRESSION MICRODISCECTOMY N/A 09/06/2014    Procedure: LUMBER DECOMPRESSION L3-5;  Surgeon: Melina Schools, MD;  Location: [facility];  Service: Orthopedics;  Laterality: N/A;  . LUMBAR SPINE SURGERY   2002  . PROSTATE BIOPSY      . PROSTATECTOMY   02/2007  . VASECTOMY             Family History  Problem  Relation Age of Onset  . Kidney disease Mother    . Stroke Father    . Breast cancer Sister          breast progressed into bone  . Colon cancer Neg Hx    . Prostate cancer Neg Hx    . Pancreatic cancer Neg Hx      Social History:  reports that he quit smoking about 11 years ago. His smoking use included cigarettes. He has a 40.00 pack-year smoking history. He quit smokeless tobacco use about 10 years ago. He reports current alcohol use of about 3.0 standard drinks of alcohol per week. He reports that he does not use drugs. Allergies:  Allergies  Allergen Reactions  . Lisinopril Cough          Medications Prior to Admission  Medication Sig Dispense Refill  . acetaminophen (TYLENOL) 500 MG tablet Take 1,000 mg by mouth every 6 (six) hours as needed for headache (pain).      Marland Kitchen amLODipine (NORVASC) 10 MG tablet TAKE 1 TABLET BY MOUTH DAILY (Patient taking differently: Take 10 mg by mouth every evening. ) 90 tablet 0  . atorvastatin (LIPITOR) 80 MG tablet TAKE 1 TABLET BY MOUTH DAILY AT 6 PM (Patient taking differently: Take 80 mg by mouth at bedtime. ) 90 tablet 1  . dexamethasone (DECADRON) 2 MG tablet Take 1 tablet (2 mg total) by mouth daily. (Patient taking differently: Take 3 mg by mouth See admin instructions. Take 1 1/2 tablets (3 mg) by mouth daily, taper down to 1 tablet (2 mg) daily on 10/12/2019, then call [name] for further instructions) 30 tablet 0  . levETIRAcetam (KEPPRA) 500 MG tablet Take 1 tablet (500 mg total) by mouth 2 (two) times daily. 60 tablet 3  . losartan-hydrochlorothiazide (HYZAAR) 100-12.5 MG tablet Take 1 tablet by mouth daily. (Patient taking differently: Take 1 tablet by mouth every evening. ) 90 tablet 2  . metoprolol succinate (TOPROL-XL) 25 MG 24 hr tablet TAKE 1 TABLET BY MOUTH DAILY (Patient taking differently: Take 25 mg by mouth every evening. ) 90 tablet 1  . polyethylene glycol (MIRALAX / GLYCOLAX) 17 g packet Take 17 g by mouth daily as needed  (constipation).       . LORazepam (ATIVAN) 0.5 MG tablet 1 tab po q 4-6 hours prn or 1 tab po 30 minutes prior to MRI or radiation (Patient not taking: Reported on 09/26/2019) 30 tablet 0  . rivaroxaban (XARELTO) 20 MG TABS tablet Take 1 tablet (20 mg total) by mouth daily with supper. 30 tablet 3      Home: Home Living Family/patient expects to be discharged to:: Private residence Living Arrangements: Spouse/significant other Available Help at Discharge: Family, Available 24 hours/day Type of Home: House Home Access: Stairs to enter [employer] of Steps: 2 Entrance Stairs-Rails: Right Home Layout: Two level, Bed/bath upstairs Alternate Level Stairs-Number of  Steps: 13 Alternate Level Stairs-Rails: Right, Left, Can reach both Bathroom Shower/Tub: Walk-in shower, Curtain Bathroom Toilet: Handicapped height Bathroom Accessibility: Yes Home Equipment: [place] - single point  Functional History: Prior Function Level of Independence: Needs assistance Gait / Transfers Assistance Needed: getting OPPT at neurorehab working on balance; using cane; had a fall last week when slipped off edge of chair ADL's / Homemaking Assistance Needed: needed assist with dressing due to vision loss (going to OPOT at neurorehab) Comments: just prior to [place], family lowered him to the floor in the bathroom Functional Status:  Mobility: Bed Mobility Overal bed mobility: Needs Assistance Bed Mobility: Supine to Sit Supine to sit: Min assist, HOB elevated (with rail) General bed mobility comments: OOB in chair Transfers Overall transfer level: Needs assistance Equipment used: Rolling walker (2 wheeled) Transfers: Sit to/from Stand Sit to Stand: Min guard Stand pivot transfers: Min assist General transfer comment: pt pivoted to his rt to incr safety due to lack of vision on left; inattention to left leg with left foot ~12" forward of right after pivot and step back to chair; cued to step back with  left "I already did, it's against the chair" Ambulation/Gait General Gait Details: pivotal steps only for safety with +1 assist   ADL: ADL Overall ADL's : Needs assistance/impaired Eating/Feeding: Set up, Sitting Grooming: Set up, Sitting Upper Body Bathing: Minimal assistance, Sitting Lower Body Bathing: Moderate assistance, Sit to/from stand Upper Body Dressing : Minimal assistance, Sitting Lower Body Dressing: Moderate assistance, Sit to/from stand Toilet Transfer: Minimal assistance, RW, BSC Functional mobility during ADLs: Minimal assistance, Rolling walker General ADL Comments: Requires increased time and cueing for moving L UE.   Cognition: Cognition Overall Cognitive Status: Impaired/Different from baseline Orientation Level: Oriented X4 Cognition Arousal/Alertness: Awake/alert Behavior During Therapy: WFL for tasks assessed/performed Overall Cognitive Status: Impaired/Different from baseline Area of Impairment: Awareness, Safety/judgement, Problem solving Safety/Judgement: Decreased awareness of safety, Decreased awareness of deficits Awareness: Emergent Problem Solving: Difficulty sequencing, Requires verbal cues General Comments: Verbals cues to move/be aware of L side.  Increased processing time.   Blood pressure 133/76, pulse 68, temperature 97.6 F (36.4 C), temperature source Oral, resp. rate (!) 22, height 6\' 2"  (1.88 m), weight 99.5 kg, SpO2 92 %.   Physical Exam  General: Alert and oriented x 3, No apparent distress HEENT: Head is normocephalic, atraumatic, PERRLA, EOMI, sclera anicteric, oral mucosa pink and moist, dentition intact, ext ear canals clear,  Neck: Supple without JVD or lymphadenopathy Heart: Reg rate and rhythm. No murmurs rubs or gallops Chest: CTA bilaterally without wheezes, rales, or rhonchi; no distress Abdomen: Soft, non-tender, non-distended, bowel sounds positive. Extremities: No clubbing, cyanosis, or edema. Pulses are 2+ Skin: Clean  and intact without signs of breakdown Neuro: Patient is alert and cooperative.  Follows commands.  Oriented x3. 5/5 strength right side, 4/5 strength left side. Sensation intact Psych: Pt's affect is appropriate. Pt is cooperative   Lab Results Last 24 Hours       Results for orders placed or performed during the hospital encounter of 10/08/19 (from the past 24 hour(s))  Glucose, capillary     Status: Abnormal    Collection Time: 10/09/19  7:26 AM  Result Value Ref Range    Glucose-Capillary 156 (H) 70 - 99 mg/dL  Glucose, capillary     Status: Abnormal    Collection Time: 10/09/19 11:33 AM  Result Value Ref Range    Glucose-Capillary 216 (H) 70 - 99 mg/dL  Glucose, capillary  Status: Abnormal    Collection Time: 10/09/19  4:05 PM  Result Value Ref Range    Glucose-Capillary 184 (H) 70 - 99 mg/dL  Glucose, capillary     Status: Abnormal    Collection Time: 10/09/19  9:13 PM  Result Value Ref Range    Glucose-Capillary 172 (H) 70 - 99 mg/dL       Imaging Results (Last 48 hours)  CT Head Wo Contrast   Result Date: 10/08/2019 CLINICAL DATA:  History of glioblastoma. Left arm weakness. Headache. Nausea. EXAM: CT HEAD WITHOUT CONTRAST TECHNIQUE: Contiguous axial images were obtained from the base of the skull through the vertex without intravenous contrast. COMPARISON:  Head MRI 07/22/2019 FINDINGS: Brain: Sequelae of right occipital craniotomy for glioblastoma debulking are again identified. There is minimal residual gas in the operative bed. There is new severe vasogenic edema extending from the right occipital lobe throughout the right temporal lobe, white matter tracts posterior to the basal ganglia, into the splenium of the corpus callosum, and into the right parietal lobe with regional sulcal effacement, subtotal effacement of the right lateral ventricle, and 8 mm of leftward midline shift. The left lateral ventricle is stable to at most minimally larger than on the prior MRI without  evidence of frank hydrocephalus. Curvilinear hyperdensity along the superior aspect of and posterior to the third ventricle has density favoring calcification over hemorrhage, and no acute intracranial hemorrhage is evident elsewhere. There is an unchanged 1 cm dense calcification along the superomedial aspect of the left tentorium. No acute large territory cortically based infarct or extra-axial fluid collection is identified. Vascular: Calcified atherosclerosis at the skull base. Skull: Right occipital craniotomy. Sinuses/Orbits: Paranasal sinuses and mastoid air cells are clear. Unremarkable orbits. Other: None. IMPRESSION: 1. New severe vasogenic edema throughout the posterior right cerebral hemisphere with 8 mm of leftward midline shift. 2. No evidence of acute intracranial abnormality. Electronically Signed   By: Logan Bores M.D.   On: 10/08/2019 20:20       Assessment/Plan: Diagnosis: Worsening left sided weakness, severe headache with nausea and vomiting due to right occipital glioblastoma multiforme resulting in vasogenic edema 1. Does the need for close, 24 hr/day medical supervision in concert with the patient's rehab needs make it unreasonable for this patient to be served in a less intensive setting? Yes 2. Co-Morbidities requiring supervision/potential complications: gross hematuria, chronic diastolic heart failure, diabetes mellitus without complication type 2, essential HTN, left sided weakness 3. Due to bladder management, bowel management, safety, skin/wound care, disease management, medication administration, pain management and patient education, does the patient require 24 hr/day rehab nursing? Yes 4. Does the patient require coordinated care of a physician, rehab nurse, therapy disciplines of PT, OT to address physical and functional deficits in the context of the above medical diagnosis(es)? Yes Addressing deficits in the following areas: balance, endurance, locomotion, strength,  transferring, bowel/bladder control, bathing, dressing, feeding, grooming, toileting and psychosocial support 5. Can the patient actively participate in an intensive therapy program of at least 3 hrs of therapy per day at least 5 days per week? Yes 6. The potential for patient to make measurable gains while on inpatient rehab is excellent 7. Anticipated functional outcomes upon discharge from inpatient rehab are modified independent  with PT, modified independent with OT, independent with SLP. 8. Estimated rehab length of stay to reach the above functional goals is: 10-14 days 9. Anticipated discharge destination: Home 10. Overall Rehab/Functional Prognosis: excellent   RECOMMENDATIONS: This patient's condition is appropriate  for continued rehabilitative care in the following setting: CIR Patient has agreed to participate in recommended program. Yes Note that insurance prior authorization may be required for reimbursement for recommended care.   Comment: [name] would be an excellent CIR candidate. He will have 24/7 support from his wife upon discharge.    Lavon Paganini Angiulli, PA-C 10/10/2019    I have personally performed a face to face diagnostic evaluation, including, but not limited to relevant history and physical exam findings, of this patient and developed relevant assessment and plan.  Additionally, I have reviewed and concur with the physician assistant's documentation above.   Leeroy Cha, MD

## 2019-10-11 NOTE — H&P (Signed)
Physical Medicine and Rehabilitation Admission H&P    Chief Complaint  Patient presents with  . 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, prostate cancer status post radiation as well as prostatectomy, CAD with CABG 2010 maintained on Xarelto, chronic diastolic congestive heart failure, type 2 diabetes mellitus, hypertension, hyperlipidemia, sleep apnea not on CPAP.  History taken from chart review, patient, and wife.  Patient lives with spouse.  Two-level home bed and bath upstairs.  He had been receiving outpatient therapies at neuro rehab.  He presented on 09/2019 with increasing left hemiparesis, headache, nausea vomiting as well as bouts of hematuria.  Admission chemistries with sodium 132, glucose 171, creatinine 0.88, hemoglobin 12.7, urinalysis negative nitrite.  CT of the head showed new severe vasogenic edema throughout the posterior right cerebral hemisphere with 8 mm leftward midline shift.  No evidence of acute intracranial abnormality.  Follow-up medical oncology services maintained on Decadron therapy.  Palliative care consulted for goals of care.  Patient remains on [place] for seizure prophylaxis.  Patient's chronic Xarelto had initially been held due to some hematuria since resumed.  Therapy evaluations completed and patient was admitted for a comprehensive rehab program.  Please see preadmission assessment from earlier today as well.  Review of Systems  Constitutional: Negative for chills and fever.  HENT: Negative for hearing loss.   Eyes: Negative for blurred vision and double vision.  Respiratory: Positive for shortness of breath. Negative for cough.   Cardiovascular: Negative for chest pain.  Gastrointestinal: Negative for abdominal pain,  heartburn, nausea and vomiting.  Genitourinary: Negative for dysuria, flank pain and hematuria.  Musculoskeletal: Positive for myalgias.  Skin: Negative for rash.  Neurological: Positive for weakness and headaches.  Psychiatric/Behavioral: Positive for depression. The patient has insomnia.        Anxiety  All other systems reviewed and are negative.  Past Medical History:  Diagnosis Date  . Anxiety   . Arthritis   . ASCVD (arteriosclerotic cardiovascular disease)    03/2004: DES x2 to the RCA; IMI with stent occlusion in 02/2005- suboptimal Plavix compliance  . CAD (coronary artery disease)   . Carcinoma of prostate ([place])    Clopidogrel held for biopsy  . Constipation due to pain medication   . Diabetes mellitus without complication ([place])   . Diabetic neuropathy ([place])   . Headache 2021  . Hyperlipidemia   . Hypertension   . Mild depression ([place])   . Morbid obesity ([place])   . Myocardial infarction ([place])   . Shortness of breath dyspnea   . Sleep apnea    no longer uses cpap  . Stroke ([place]) 2017  . Tobacco abuse    stopped smoking 06/28/09   Past Surgical History:  Procedure Laterality Date  . APPLICATION OF CRANIAL NAVIGATION N/A 07/04/2019   Procedure: APPLICATION OF CRANIAL NAVIGATION;  Surgeon: Ashok Pall, MD;  Location: [facility];  Service: Neurosurgery;  Laterality: N/A;  . BACK SURGERY    . CARDIAC CATHETERIZATION     X 1 stent before having CABG  . CORONARY ARTERY BYPASS GRAFT  06/28/2008   X4  . CRANIOTOMY Right 07/04/2019   Procedure: Right occipital craniotomy for tumor with brainlab;  Surgeon: Ashok Pall, MD;  Location: [facility];  Service: Neurosurgery;  Laterality: Right;  . ENDARTERECTOMY Right  07/03/2015   Procedure: RIGHT CAROTID ENDARTERECTOMY WITH BOVINE PERICARDIUM PATCH ANGIOPLASTY;  Surgeon: Serafina Mitchell, MD;  Location: [facility];  Service: Vascular;  Laterality: Right;  . LUMBAR LAMINECTOMY/DECOMPRESSION MICRODISCECTOMY N/A 09/06/2014   Procedure: LUMBER  DECOMPRESSION L3-5;  Surgeon: Melina Schools, MD;  Location: [facility];  Service: Orthopedics;  Laterality: N/A;  . LUMBAR SPINE SURGERY  2002  . PROSTATE BIOPSY    . PROSTATECTOMY  02/2007  . VASECTOMY     Family History  Problem Relation Age of Onset  . Kidney disease Mother   . Stroke Father   . Breast cancer Sister        breast progressed into bone  . Colon cancer Neg Hx   . Prostate cancer Neg Hx   . Pancreatic cancer Neg Hx    Social History:  reports that he quit smoking about 11 years ago. His smoking use included cigarettes. He has a 40.00 pack-year smoking history. He quit smokeless tobacco use about 10 years ago. He reports current alcohol use of about 3.0 standard drinks of alcohol per week. He reports that he does not use drugs. Allergies:  Allergies  Allergen Reactions  . Lisinopril Cough   Medications Prior to Admission  Medication Sig Dispense Refill  . acetaminophen (TYLENOL) 500 MG tablet Take 1,000 mg by mouth every 6 (six) hours as needed for headache (pain).    Marland Kitchen amLODipine (NORVASC) 10 MG tablet TAKE 1 TABLET BY MOUTH DAILY (Patient taking differently: Take 10 mg by mouth every evening. ) 90 tablet 0  . atorvastatin (LIPITOR) 80 MG tablet TAKE 1 TABLET BY MOUTH DAILY AT 6 PM (Patient taking differently: Take 80 mg by mouth at bedtime. ) 90 tablet 1  . dexamethasone (DECADRON) 2 MG tablet Take 1 tablet (2 mg total) by mouth daily. (Patient taking differently: Take 3 mg by mouth See admin instructions. Take 1 1/2 tablets (3 mg) by mouth daily, taper down to 1 tablet (2 mg) daily on 10/12/2019, then call [name] for further instructions) 30 tablet 0  . levETIRAcetam (KEPPRA) 500 MG tablet Take 1 tablet (500 mg total) by mouth 2 (two) times daily. 60 tablet 3  . losartan-hydrochlorothiazide (HYZAAR) 100-12.5 MG tablet Take 1 tablet by mouth daily. (Patient taking differently: Take 1 tablet by mouth every evening. ) 90 tablet 2  . metoprolol succinate (TOPROL-XL) 25 MG 24  hr tablet TAKE 1 TABLET BY MOUTH DAILY (Patient taking differently: Take 25 mg by mouth every evening. ) 90 tablet 1  . polyethylene glycol (MIRALAX / GLYCOLAX) 17 g packet Take 17 g by mouth daily as needed (constipation).     . LORazepam (ATIVAN) 0.5 MG tablet 1 tab po q 4-6 hours prn or 1 tab po 30 minutes prior to MRI or radiation (Patient not taking: Reported on 09/26/2019) 30 tablet 0  . rivaroxaban (XARELTO) 20 MG TABS tablet Take 1 tablet (20 mg total) by mouth daily with supper. 30 tablet 3    Drug Regimen Review Drug regimen was reviewed and remains appropriate with no significant issues identified  Home: Home Living Family/patient expects to be discharged to:: Private residence Living Arrangements: Spouse/significant other Available Help at Discharge: Family, Available 24 hours/day Type of Home: House Home Access: Stairs to enter [employer] of Steps: 2 Entrance Stairs-Rails: Right Home Layout: Two level, Bed/bath upstairs Alternate Level Stairs-Number of Steps: 13 Alternate Level Stairs-Rails: Right, Left, Can reach both Bathroom Shower/Tub: Walk-in shower, Curtain Bathroom Toilet: Handicapped height Bathroom Accessibility:  Yes Home Equipment: Cane - single point   Functional History: Prior Function Level of Independence: Needs assistance Gait / Transfers Assistance Needed: getting OPPT at neurorehab working on balance; using cane; had a fall last week when slipped off edge of chair ADL's / Homemaking Assistance Needed: needed assist with dressing due to vision loss (going to OPOT at neurorehab) Comments: just prior to admision, family lowered him to the floor in the bathroom  Functional Status:  Mobility: Bed Mobility Overal bed mobility: Needs Assistance Bed Mobility: Supine to Sit Supine to sit: HOB elevated, Min guard (with rail) General bed mobility comments: OOB in chair Transfers Overall transfer level: Needs assistance Equipment used: Rolling  walker (2 wheeled) Transfers: Sit to/from Stand Sit to Stand: Min guard Stand pivot transfers: Min assist General transfer comment: max cues for sequencing with RW; from bed and from recliner x 2 Ambulation/Gait Ambulation/Gait assistance: +2 safety/equipment, Mod assist (close follow with recliner) Gait Distance (Feet): 20 Feet (seated rest; 25) Assistive device: Rolling walker (2 wheeled) Gait Pattern/deviations: Step-to pattern, Decreased dorsiflexion - left, Drifts right/left General Gait Details: despite cues, continued to advance RLE slightly past LLEand take very small steps with LLE; with fatigue, noted left genu recurvatum; tended to drift to the left inside the RW with left foot against back leg of RW    ADL: ADL Overall ADL's : Needs assistance/impaired Eating/Feeding: Set up, Sitting Grooming: Set up, Sitting Upper Body Bathing: Minimal assistance, Sitting Lower Body Bathing: Moderate assistance, Sit to/from stand Upper Body Dressing : Minimal assistance, Sitting Lower Body Dressing: Moderate assistance, Sit to/from stand Toilet Transfer: Minimal assistance, RW, BSC Functional mobility during ADLs: Minimal assistance, Rolling walker General ADL Comments: Requires increased time and cueing for moving L UE.  Cognition: Cognition Overall Cognitive Status: Impaired/Different from baseline Orientation Level: Oriented X4 Cognition Arousal/Alertness: Awake/alert Behavior During Therapy: WFL for tasks assessed/performed Overall Cognitive Status: Impaired/Different from baseline Area of Impairment: Awareness, Safety/judgement, Problem solving Safety/Judgement: Decreased awareness of safety, Decreased awareness of deficits Awareness: Emergent Problem Solving: Difficulty sequencing, Requires verbal cues General Comments: Verbals cues to attend to L side (putting left hand on RW; advancing LLE first).  Increased processing time.  Physical Exam: Blood pressure (!) 97/52, pulse  67, temperature 97.6 F (36.4 C), temperature source Oral, resp. rate 14, height 6\' 2"  (1.88 m), weight 99.5 kg, SpO2 94 %. Physical Exam  Vitals reviewed. Constitutional: No distress.  HENT:  Head: Normocephalic and atraumatic.  Right Ear: External ear normal.  Left Ear: External ear normal.  Nose: Nose normal.  Eyes: Right eye exhibits no discharge. Left eye exhibits no discharge.  Respiratory: Effort normal. No stridor. No respiratory distress.  GI: Normal appearance. He exhibits no distension.  Musculoskeletal:     Cervical back: Normal range of motion.     Comments: No edema or tenderness in extremities  Neurological: He is alert.  Alert NAD Makes good eye contact with examiner and follows commands Motor: RUE: 5/5 proximal distal LUE: 4+/5 proximal distal RLE: 4+/5 proximal distal LLE: 4-4+/5 proximal to distal  Skin: Skin is warm and dry.  Psychiatric: His behavior is normal. Mood and thought content normal.    Results for orders placed or performed during the hospital encounter of 10/08/19 (from the past 48 hour(s))  Glucose, capillary     Status: Abnormal   Collection Time: 10/09/19 11:33 AM  Result Value Ref Range   Glucose-Capillary 216 (H) 70 - 99 mg/dL    Comment: Glucose reference range  applies only to samples taken after fasting for at least 8 hours.  Glucose, capillary     Status: Abnormal   Collection Time: 10/09/19  4:05 PM  Result Value Ref Range   Glucose-Capillary 184 (H) 70 - 99 mg/dL    Comment: Glucose reference range applies only to samples taken after fasting for at least 8 hours.  Glucose, capillary     Status: Abnormal   Collection Time: 10/09/19  9:13 PM  Result Value Ref Range   Glucose-Capillary 172 (H) 70 - 99 mg/dL    Comment: Glucose reference range applies only to samples taken after fasting for at least 8 hours.  Glucose, capillary     Status: Abnormal   Collection Time: 10/10/19  7:50 AM  Result Value Ref Range   Glucose-Capillary 147  (H) 70 - 99 mg/dL    Comment: Glucose reference range applies only to samples taken after fasting for at least 8 hours.  Glucose, capillary     Status: Abnormal   Collection Time: 10/10/19 11:22 AM  Result Value Ref Range   Glucose-Capillary 193 (H) 70 - 99 mg/dL    Comment: Glucose reference range applies only to samples taken after fasting for at least 8 hours.  Glucose, capillary     Status: Abnormal   Collection Time: 10/10/19  3:52 PM  Result Value Ref Range   Glucose-Capillary 133 (H) 70 - 99 mg/dL    Comment: Glucose reference range applies only to samples taken after fasting for at least 8 hours.  Glucose, capillary     Status: Abnormal   Collection Time: 10/10/19  9:00 PM  Result Value Ref Range   Glucose-Capillary 178 (H) 70 - 99 mg/dL    Comment: Glucose reference range applies only to samples taken after fasting for at least 8 hours.  Glucose, capillary     Status: Abnormal   Collection Time: 10/11/19  7:29 AM  Result Value Ref Range   Glucose-Capillary 156 (H) 70 - 99 mg/dL    Comment: Glucose reference range applies only to samples taken after fasting for at least 8 hours.   No results found.     Medical Problem List and Plan: 1.  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  CONTINUE DECADRON 4 MG BID ON DISCHARGE.Marland Kitchen  Plan outpatient MRI with medical oncology  -patient may shower  -ELOS/Goals: 9--13 days/supervision  Admit to CIR 2.  Antithrombotics: -DVT/anticoagulation: Xarelto  -antiplatelet therapy: N/A 3. Pain Management: Tylenol as needed 4. Mood: 5 emotional support  -antipsychotic agents: N/A 5. Neuropsych: This patient is capable of making decisions on his own behalf. 6. Skin/Wound Care: Routine skin checks 7. Fluids/Electrolytes/Nutrition: Routine in and outs.  CMP ordered. 8.  Chronic  diastolic congestive heart failure.  Monitor for any signs of fluid overload  Daily weights 9.  Diabetes mellitus with peripheral neuropathy.  Hemoglobin A1c 6.9.  Levemir 5 units twice daily.  Monitor while on Decadron therapy  Monitor with increased mobility 10.  Seizure prophylaxis.  Keppra 500 mg twice daily.  No seizure activity 11.  Hypertension.  Toprol-XL 25 mg daily, Norvasc 10 mg daily.    Monitor with increased mobility 12.  Hyperlipidemia.  Lipitor 13.  History of prostate cancer with hematuria.  Monitor while on Xarelto 14.  CAD/CABG 2010.  Continue Xarelto.  No chest pain or shortness of breath  Quillian Quince  J Angiulli, PA-C 10/11/2019  I have personally performed a face to face diagnostic evaluation, including, but not limited to relevant history and physical exam findings, of this patient and developed relevant assessment and plan.  Additionally, I have reviewed and concur with the physician assistant's documentation above.  Delice Lesch, MD, ABPMR  The patient's status has not changed. The original post admission physician evaluation remains appropriate, and any changes from the pre-admission screening or documentation from the acute chart are noted above.   Delice Lesch, MD, ABPMR'

## 2019-10-11 NOTE — Psychosocial Assessment (Signed)
PMR Admission Coordinator Pre-Admission Assessment   Patient: [name] is an 74 y.o., male MRN: [id number] DOB: 05/12/1945 Height: 6\' 2"  (188 cm) Weight: 99.5 kg                                                                                                                                                  Insurance Information HMO:     PPO:     PCP:      IPA:      80/20:      OTHER:  PRIMARY: Medicare A and B      Policy#: [id number]      Subscriber: pt CM Name:       Phone#:      Fax#:  Pre-Cert#: verified online      Employer:  Benefits:  Phone #:      Name:  Eff. Date: A and B 12/20/10     Deduct: $1484      Out of Pocket Max: n/a      Life Max: n/a  CIR: 100%      SNF: 20 full days Outpatient: 80%     Co-Pay: 20% Home Health: 100%      Co-Pay: DME: 80%     Co-Pay: 20% Providers: n/a SECONDARY:       Policy#:       Phone#:    Development worker, community:       Phone#:    The "Data Collection Information Summary" for patients in Inpatient Rehabilitation Facilities with attached "Privacy Act [place] Records" was provided and verbally reviewed with: Patient and Family   Emergency Contact Information Contact Information       Name Relation Home Work Mobile    [place] Spouse [phone]   [phone]         Current Medical History  Patient Admitting Diagnosis: vasogenic edema with 8 mm MLS following radiation and glioblastoma resection   History of Present Illness: [name] is a 74 year old right-handed male 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, prostate cancer status post radiation as well as prostatectomy, CAD with CABG 2010 maintained on Xarelto, chronic diastolic congestive heart failure, type 2 diabetes mellitus, hypertension, hyperlipidemia, sleep apnea not on CPAP.  Presented  10/08/2019 with increasing left side weakness headache nausea vomiting as well as bouts of hematuria.  Admission chemistries with sodium 132, glucose 171, creatinine 0.88, hemoglobin 12.7, urinalysis negative nitrite.  CT of the head showed new severe vasogenic edema throughout the posterior right cerebral hemisphere with 8 mm leftward midline shift.  No evidence of acute intracranial abnormality.  Follow-up medical oncology services maintained on Decadron therapy.  Palliative care consulted for goals of care.  Patient remains on [place] for seizure  prophylaxis.  Patient's chronic Xarelto had initially been held due to some hematuria since resumed.  Therapy evaluations completed and patient was recommended for a comprehensive rehab program.   Complete NIHSS TOTAL: 4 Glasgow Coma Scale Score: 15   Past Medical History      Past Medical History:  Diagnosis Date  . Anxiety    . Arthritis    . ASCVD (arteriosclerotic cardiovascular disease)      03/2004: DES x2 to the RCA; IMI with stent occlusion in 02/2005- suboptimal Plavix compliance  . CAD (coronary artery disease)    . Carcinoma of prostate ([place])      Clopidogrel held for biopsy  . Constipation due to pain medication    . Diabetes mellitus without complication ([place])    . Diabetic neuropathy ([place])    . Headache 2021  . Hyperlipidemia    . Hypertension    . Mild depression ([place])    . Morbid obesity ([place])    . Myocardial infarction ([place])    . Shortness of breath dyspnea    . Sleep apnea      no longer uses cpap  . Stroke ([place]) 2017  . Tobacco abuse      stopped smoking 06/28/09      Family History  family history includes Breast cancer in his sister; Kidney disease in his mother; Stroke in his father.   Prior Rehab/Hospitalizations:  Has the patient had prior rehab or hospitalizations prior to admission? Yes   Has the patient had major surgery during 100 days prior to admission? Yes   Current Medications    Current  Facility-Administered Medications:  .  acetaminophen (TYLENOL) tablet 650 mg, 650 mg, Oral, Q6H PRN **OR** acetaminophen (TYLENOL) suppository 650 mg, 650 mg, Rectal, Q6H PRN, Shela Leff, MD .  amLODipine (NORVASC) tablet 10 mg, 10 mg, Oral, QPM, Shela Leff, MD, 10 mg at 10/10/19 1638 .  atorvastatin (LIPITOR) tablet 80 mg, 80 mg, Oral, QHS, Shela Leff, MD, 80 mg at 10/10/19 2233 .  dexamethasone (DECADRON) tablet 4 mg, 4 mg, Oral, Q6H, Charlynne Cousins, MD, 4 mg at 10/11/19 1224 .  insulin aspart (novoLOG) injection 0-15 Units, 0-15 Units, Subcutaneous, TID WC, Shela Leff, MD, 3 Units at 10/11/19 1224 .  insulin aspart (novoLOG) injection 0-5 Units, 0-5 Units, Subcutaneous, QHS, Rathore, Vasundhra, MD .  insulin detemir (LEVEMIR) injection 5 Units, 5 Units, Subcutaneous, BID, Charlynne Cousins, MD, 5 Units at 10/11/19 [phone] .  levETIRAcetam (KEPPRA) tablet 500 mg, 500 mg, Oral, BID, Shela Leff, MD, 500 mg at 10/11/19 0816 .  metoprolol succinate (TOPROL-XL) 24 hr tablet 25 mg, 25 mg, Oral, QPM, Shela Leff, MD, 25 mg at 10/10/19 1639 .  morphine 2 MG/ML injection 2 mg, 2 mg, Intravenous, Q4H PRN, Shela Leff, MD .  ondansetron (ZOFRAN) injection 4 mg, 4 mg, Intravenous, Q6H PRN, Shela Leff, MD .  polyethylene glycol (MIRALAX / GLYCOLAX) packet 17 g, 17 g, Oral, Daily PRN, Shela Leff, MD .  rivaroxaban (XARELTO) tablet 20 mg, 20 mg, Oral, Q supper, Charlynne Cousins, MD   Patients Current Diet:  Diet Order                  Diet Heart Room service appropriate? Yes; Fluid consistency: Thin  Diet effective now                         Precautions / Restrictions Precautions Precautions: Fall, Other (comment) Precaution Comments:  L inattention Restrictions Weight Bearing Restrictions: No    Has the patient had 2 or more falls or a fall with injury in the past year?No   Prior Activity Level Community  (5-7x/wk): s/p glioblastoma resection and radiation/chemo since March of this year, no longer driving, using RW for ambulation, and needed occasional assist for ADLs for time management and organization   Prior Functional Level Prior Function Level of Independence: Needs assistance Gait / Transfers Assistance Needed: getting OPPT at neurorehab working on balance; using cane; had a fall last week when slipped off edge of chair ADL's / Homemaking Assistance Needed: needed assist with dressing due to vision loss (going to OPOT at neurorehab) Comments: just prior to [place], family lowered him to the floor in the bathroom   Self Care: Did the patient need help bathing, dressing, using the toilet or eating?  Independent, but wife would help for time management   Indoor Mobility: Did the patient need assistance with walking from room to room (with or without device)? Independent   Stairs: Did the patient need assistance with internal or external stairs (with or without device)? Independent   Functional Cognition: Did the patient need help planning regular tasks such as shopping or remembering to take medications? Independent   Home Assistive Devices / Equipment Home Equipment: Cane - single point   Prior Device Use: Indicate devices/aids used by the patient prior to current illness, exacerbation or injury? Walker   Current Functional Level Cognition   Overall Cognitive Status: Impaired/Different from baseline Orientation Level: Oriented X4 Safety/Judgement: Decreased awareness of safety, Decreased awareness of deficits General Comments: Verbals cues to attend to L side (putting left hand on RW; advancing LLE first).  Increased processing time.    Extremity Assessment (includes Sensation/Coordination)   Upper Extremity Assessment: Overall WFL for tasks assessed, LUE deficits/detail LUE Deficits / Details: Shoulder 3-/5 strength, rest of arm/hand 3+/5.  Less awareness of L UE.  Poor  coordination, finger to nose. LUE Sensation: decreased proprioception LUE Coordination: decreased fine motor, decreased gross motor  Lower Extremity Assessment: Defer to PT evaluation LLE Deficits / Details: hip flexion 3+, knee extension 3+, ankle DF 3+ LLE Sensation:  (reports intact, but not able to sense whether touching chair)     ADLs   Overall ADL's : Needs assistance/impaired Eating/Feeding: Set up, Sitting Grooming: Set up, Sitting Upper Body Bathing: Minimal assistance, Sitting Lower Body Bathing: Moderate assistance, Sit to/from stand Upper Body Dressing : Minimal assistance, Sitting Lower Body Dressing: Moderate assistance, Sit to/from stand Toilet Transfer: Minimal assistance, RW, BSC Functional mobility during ADLs: Minimal assistance, Rolling walker General ADL Comments: Requires increased time and cueing for moving L UE.     Mobility   Overal bed mobility: Needs Assistance Bed Mobility: Supine to Sit Supine to sit: HOB elevated, Min guard (with rail) General bed mobility comments: OOB in chair     Transfers   Overall transfer level: Needs assistance Equipment used: Rolling walker (2 wheeled) Transfers: Sit to/from Stand Sit to Stand: Min guard Stand pivot transfers: Min assist General transfer comment: max cues for sequencing with RW; from bed and from recliner x 2     Ambulation / Gait / Stairs / Wheelchair Mobility   Ambulation/Gait Ambulation/Gait assistance: +2 safety/equipment, Mod assist (close follow with recliner) Gait Distance (Feet): 20 Feet (seated rest; 25) Assistive device: Rolling walker (2 wheeled) Gait Pattern/deviations: Step-to pattern, Decreased dorsiflexion - left, Drifts right/left General Gait Details: despite cues, continued to advance RLE slightly  past LLEand take very small steps with LLE; with fatigue, noted left genu recurvatum; tended to drift to the left inside the RW with left foot against back leg of RW     Posture / Balance  Balance Overall balance assessment: Needs assistance Sitting-balance support: Feet supported, No upper extremity supported Sitting balance-Leahy Scale: Fair Postural control: Posterior lean Standing balance support: Bilateral upper extremity supported Standing balance-Leahy Scale: Poor Standing balance comment: requires bil UE support     Special needs/care consideration Diabetic management yes (monitor blood glucose while on steroids) and Designated visitor [place] (from acute therapy documentation) Living Arrangements: Spouse/significant other Available Help at Discharge: Family, Available 24 hours/day Type of Home: House Home Layout: Two level, Bed/bath upstairs Alternate Level Stairs-Rails: Right, Left, Can reach both Alternate Level Stairs-Number of Steps: 13 Home Access: Stairs to enter Entrance Stairs-Rails: Right Entrance Stairs-Number of Steps: 2 Bathroom Shower/Tub: Gaffer, Architectural technologist: Handicapped height Bathroom Accessibility: Yes   Discharge Living Setting Plans for Discharge Living Setting: Patient's home Type of Home at Discharge: House Discharge Home Layout: Two level, Bed/bath upstairs Alternate Level Stairs-Rails: Right, Left (left rail is 1/2 rail, then transitions to wall ) Alternate Level Stairs-Number of Steps: full flight Discharge Home Access: Stairs to enter Entrance Stairs-Rails: Right Entrance Stairs-Number of Steps: 2 through garage Discharge Bathroom Shower/Tub: Walk-in shower Discharge Bathroom Toilet: Handicapped height Discharge Bathroom Accessibility: Yes How Accessible: Accessible via walker Does the patient have any problems obtaining your medications?: No   Social/Family/Support Systems Patient Roles: Spouse Anticipated Caregiver: wife, Avis Epley Anticipated Caregiver's Contact Information: [phone] Ability/Limitations of Caregiver: supervision to min  assist Caregiver Availability: 24/7 Discharge Plan Discussed with Primary Caregiver: Yes Is Caregiver In Agreement with Plan?: Yes Does Caregiver/Family have Issues with Lodging/Transportation while Pt is in Rehab?: No     Goals Patient/Family Goal for Rehab: PT/OT Supervision, SLP independent Expected length of stay: 12-16 days Pt/Family Agrees to Admission and willing to participate: Yes Program Orientation Provided & Reviewed with Pt/Caregiver Including Roles  & Responsibilities: Yes     Decrease burden of Care through IP rehab admission: n/a   Possible need for SNF placement upon discharge:  Not anticipated   Patient Condition: This patient's condition remains as documented in the consult dated 6/22, in which the Rehabilitation Physician determined and documented that the patient's condition is appropriate for intensive rehabilitative care in an inpatient rehabilitation facility. Will admit to inpatient rehab today.   Preadmission Screen Completed By:  Michel Santee, PT, DPT 10/11/2019 1:17 PM ______________________________________________________________________   Discussed status with Dr. Posey Pronto on 10/11/19 at  1:17 PM  and received approval for admission today.   Admission Coordinator:  Michel Santee, PT, DPT time 1:17 PM Sudie Grumbling 10/11/19

## 2019-10-12 ENCOUNTER — Inpatient Hospital Stay (HOSPITAL_COMMUNITY): Payer: Medicare Other

## 2019-10-12 ENCOUNTER — Inpatient Hospital Stay (HOSPITAL_COMMUNITY): Payer: Medicare Other | Admitting: Speech Pathology

## 2019-10-12 DIAGNOSIS — C719 Malignant neoplasm of brain, unspecified: Secondary | ICD-10-CM

## 2019-10-12 LAB — COMPREHENSIVE METABOLIC PANEL
ALT: 36 U/L (ref 0–44)
AST: 22 U/L (ref 15–41)
Albumin: 3.1 g/dL — ABNORMAL LOW (ref 3.5–5.0)
Alkaline Phosphatase: 61 U/L (ref 38–126)
Anion gap: 9 (ref 5–15)
BUN: 28 mg/dL — ABNORMAL HIGH (ref 8–23)
CO2: 27 mmol/L (ref 22–32)
Calcium: 8.7 mg/dL — ABNORMAL LOW (ref 8.9–10.3)
Chloride: 95 mmol/L — ABNORMAL LOW (ref 98–111)
Creatinine, Ser: 0.85 mg/dL (ref 0.61–1.24)
GFR calc Af Amer: >60 mL/min (ref >60)
GFR calc non Af Amer: >60 mL/min (ref >60)
Glucose, Bld: 177 mg/dL — ABNORMAL HIGH (ref 70–99)
Potassium: 4.6 mmol/L (ref 3.5–5.1)
Sodium: 131 mmol/L — ABNORMAL LOW (ref 135–145)
Total Bilirubin: 1.1 mg/dL (ref 0.3–1.2)
Total Protein: 5.7 g/dL — ABNORMAL LOW (ref 6.5–8.1)

## 2019-10-12 LAB — CBC WITH DIFFERENTIAL/PLATELET
Abs Immature Granulocytes: 0.04 10*3/uL (ref 0.00–0.07)
Basophils Absolute: 0 10*3/uL (ref 0.0–0.1)
Basophils Relative: 0 %
Eosinophils Absolute: 0 10*3/uL (ref 0.0–0.5)
Eosinophils Relative: 0 %
HCT: 37.1 % — ABNORMAL LOW (ref 39.0–52.0)
Hemoglobin: 12.7 g/dL — ABNORMAL LOW (ref 13.0–17.0)
Immature Granulocytes: 1 %
Lymphocytes Relative: 3 %
Lymphs Abs: 0.2 10*3/uL — ABNORMAL LOW (ref 0.7–4.0)
MCH: 28.9 pg (ref 26.0–34.0)
MCHC: 34.2 g/dL (ref 30.0–36.0)
MCV: 84.5 fL (ref 80.0–100.0)
Monocytes Absolute: 0.3 10*3/uL (ref 0.1–1.0)
Monocytes Relative: 5 %
Neutro Abs: 5.9 10*3/uL (ref 1.7–7.7)
Neutrophils Relative %: 91 %
Platelets: 145 10*3/uL — ABNORMAL LOW (ref 150–400)
RBC: 4.39 MIL/uL (ref 4.22–5.81)
RDW: 13.2 % (ref 11.5–15.5)
WBC: 6.5 10*3/uL (ref 4.0–10.5)
nRBC: 0 % (ref 0.0–0.2)

## 2019-10-12 LAB — GLUCOSE, CAPILLARY
Glucose-Capillary: 179 mg/dL — ABNORMAL HIGH (ref 70–99)
Glucose-Capillary: 184 mg/dL — ABNORMAL HIGH (ref 70–99)
Glucose-Capillary: 182 mg/dL — ABNORMAL HIGH (ref 70–99)
Glucose-Capillary: 205 mg/dL — ABNORMAL HIGH (ref 70–99)

## 2019-10-12 MED ORDER — LIVING WELL WITH DIABETES BOOK
Status: DC
  Filled 2019-10-12: qty 1

## 2019-10-12 MED ORDER — BLOOD PRESSURE CONTROL BOOK
Status: DC
  Filled 2019-10-12: qty 1

## 2019-10-12 MED ORDER — CALCIUM CITRATE 950 (200 CA) MG PO TABS
200.0000 mg | ORAL_TABLET | Freq: Every day | ORAL | Status: DC
  Administered 2019-10-12 – 2019-10-18 (×7): 200 mg via ORAL
  Filled 2019-10-12 (×7): qty 1

## 2019-10-12 NOTE — Progress Notes (Signed)
[facility] PHYSICAL MEDICINE & REHABILITATION PROGRESS NOTE   Subjective/Complaints: No complaints this morning. Had a good night. Very happy with care here.  ROS: Denies fever, chills, constipation, insomnia, pain.    Objective:   No results found. Recent Labs    10/11/19 0928 10/12/19 0515  WBC 7.0 6.5  HGB 12.8* 12.7*  HCT 37.6* 37.1*  PLT 169 145*   Recent Labs    10/11/19 0928 10/12/19 0515  NA 131* 131*  K 4.5 4.6  CL 94* 95*  CO2 29 27  GLUCOSE 227* 177*  BUN 26* 28*  CREATININE 0.92 0.85  CALCIUM 8.8* 8.7*    Intake/Output Summary (Last 24 hours) at 10/12/2019 1143 Last data filed at 10/12/2019 0800 Gross per 24 hour  Intake 480 ml  Output 500 ml  Net -20 ml     Physical Exam: Vital Signs Blood pressure 136/73, pulse (!) 55, temperature 97.9 F (36.6 C), temperature source Oral, resp. rate 18, height 6\' 2"  (1.88 m), weight 94.7 kg, SpO2 98 %. General: Alert and oriented x 3, No apparent distress HEENT: Head is normocephalic, atraumatic, PERRLA, EOMI, sclera anicteric, oral mucosa pink and moist, dentition intact, ext ear canals clear,  Neck: Supple without JVD or lymphadenopathy Heart: Reg rate and rhythm. No murmurs rubs or gallops Chest: CTA bilaterally without wheezes, rales, or rhonchi; no distress Abdomen: Soft, non-tender, non-distended, bowel sounds positive. Extremities: No clubbing, cyanosis, or edema. Pulses are 2+ Skin: Clean and intact without signs of breakdown Neurological: He is alert.  Alert NAD Makes good eye contact with examiner and follows commands Motor: RUE: 5/5 proximal distal LUE: 4+/5 proximal distal RLE: 4+/5 proximal distal LLE: 4-4+/5 proximal to distal  Skin: Skin is warm and dry.  Psychiatric: His behavior is normal. Mood and thought content normal.    Assessment/Plan: 1. Functional deficits secondary to glioblastoma multiforme which require 3+ hours per day of interdisciplinary therapy in a comprehensive  inpatient rehab setting.  Physiatrist is providing close team supervision and 24 hour management of active medical problems listed below.  Physiatrist and rehab team continue to assess barriers to discharge/monitor patient progress toward functional and medical goals  Care Tool:  Bathing              Bathing assist       Upper Body Dressing/Undressing Upper body dressing   What is the patient wearing?: Hospital gown only    Upper body assist Assist Level: Minimal Assistance - Patient > 75%    Lower Body Dressing/Undressing Lower body dressing      What is the patient wearing?: Hospital gown only     Lower body assist Assist for lower body dressing: Minimal Assistance - Patient > 75%     Toileting Toileting    Toileting assist       Transfers Chair/bed transfer  Transfers assist     Chair/bed transfer assist level: Minimal Assistance - Patient > 75%     Locomotion Ambulation   Ambulation assist              Walk 10 feet activity   Assist           Walk 50 feet activity   Assist           Walk 150 feet activity   Assist           Walk 10 feet on uneven surface  activity   Assist  Wheelchair     Assist               Wheelchair 50 feet with 2 turns activity    Assist            Wheelchair 150 feet activity     Assist          Blood pressure 136/73, pulse (!) 55, temperature 97.9 F (36.6 C), temperature source Oral, resp. rate 18, height 6\' 2"  (1.88 m), weight 94.7 kg, SpO2 98 %.  Medical Problem List and Plan: 1.  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  CONTINUE DECADRON 4 MG BID ON DISCHARGE. Plan outpatient MRI with medical oncology             -patient may shower             -ELOS/Goals: 9--13  days/supervision             -Initial CIR evaluations today.  2.  Antithrombotics: -DVT/anticoagulation: Xarelto             -antiplatelet therapy: N/A 3. Pain Management: Tylenol as needed. Pain is well controlled.  4. Mood: 5 emotional support             -antipsychotic agents: N/A 5. Neuropsych: This patient is capable of making decisions on his own behalf. 6. Skin/Wound Care: Routine skin checks 7. Fluids/Electrolytes/Nutrition: Routine in and outs.    6/24: Na low at 133. Recheck Monday 8.  Chronic diastolic congestive heart failure.  Monitor for any signs of fluid overload             Daily weights 9.  Diabetes mellitus with peripheral neuropathy.  Hemoglobin A1c 6.9.  Levemir 5 units twice daily.  Monitor while on Decadron therapy  6/24: CBGs elevated. Increase Levemir to 6U BID.              Monitor with increased mobility 10.  Seizure prophylaxis.  Keppra 500 mg twice daily.  No seizure activity 11.  Hypertension.  Toprol-XL 25 mg daily, Norvasc 10 mg daily.               Monitor with increased mobility 12.  Hyperlipidemia.  Lipitor 13.  History of prostate cancer with hematuria.  Monitor while on Xarelto 14.  CAD/CABG 2010.  Continue Xarelto.  No chest pain or shortness of breath 15. Hypocalcemia: started supplement.     LOS: 1 days A FACE TO FACE EVALUATION WAS PERFORMED  Clide Deutscher Lyden Redner 10/12/2019, 11:43 AM

## 2019-10-12 NOTE — Progress Notes (Signed)
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 Anxiety   . Arthritis   . ASCVD (arteriosclerotic cardiovascular disease)    03/2004: DES x2 to the RCA; IMI with stent occlusion in 02/2005- suboptimal Plavix compliance  . CAD (coronary artery disease)   . Carcinoma of prostate ([place])    Clopidogrel held for biopsy  . Constipation due to pain medication   . Diabetes mellitus without complication ([place])   . Diabetic neuropathy ([place])   . Headache 2021  . Hyperlipidemia   . Hypertension   . Mild depression ([place])   . Morbid obesity ([place])   . Myocardial infarction ([place])   . Shortness of breath dyspnea   . Sleep apnea    no longer uses cpap  . Stroke ([place]) 2017  . Tobacco abuse    stopped smoking 06/28/09   Past Surgical History:  Past Surgical History:  Procedure Laterality Date  . APPLICATION OF CRANIAL NAVIGATION N/A 07/04/2019   Procedure: APPLICATION OF CRANIAL NAVIGATION;  Surgeon: Ashok Pall, MD;  Location: [facility];  Service: Neurosurgery;  Laterality: N/A;  . BACK SURGERY    . CARDIAC CATHETERIZATION     X 1 stent before having CABG  . CORONARY ARTERY BYPASS GRAFT  06/28/2008   X4  . CRANIOTOMY Right 07/04/2019   Procedure: Right occipital craniotomy for tumor with brainlab;  Surgeon: Ashok Pall, MD;  Location: [facility];  Service: Neurosurgery;  Laterality: Right;  . ENDARTERECTOMY Right 07/03/2015   Procedure: RIGHT CAROTID ENDARTERECTOMY WITH BOVINE  PERICARDIUM PATCH ANGIOPLASTY;  Surgeon: Serafina Mitchell, MD;  Location: [facility];  Service: Vascular;  Laterality: Right;  . LUMBAR LAMINECTOMY/DECOMPRESSION MICRODISCECTOMY N/A 09/06/2014   Procedure: LUMBER DECOMPRESSION L3-5;  Surgeon: Melina Schools, MD;  Location: [facility];  Service: Orthopedics;  Laterality: N/A;  . LUMBAR SPINE SURGERY  2002  . PROSTATE BIOPSY    . PROSTATECTOMY  02/2007  . 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, prostate cancer status post radiation as well as prostatectomy, CAD with CABG 2010 maintained on Xarelto, chronic diastolic congestive heart failure, type 2 diabetes mellitus, hypertension, hyperlipidemia, sleep apnea not on CPAP.  History taken from chart review, patient, and wife.  Patient lives with spouse.  Two-level home bed and bath upstairs.  He had been receiving outpatient therapies at neuro rehab.  He presented on 09/2019 with increasing left hemiparesis, headache, nausea vomiting as well as bouts of hematuria.  Admission chemistries with sodium 132, glucose 171, creatinine 0.88, hemoglobin 12.7, urinalysis negative nitrite.  CT of the head showed new severe vasogenic edema throughout the posterior right cerebral hemisphere with 8 mm leftward midline shift.  No evidence of acute intracranial abnormality.  Follow-up medical oncology services maintained on Decadron therapy.  Palliative care consulted for goals of care.  Patient remains on  Keppra for seizure prophylaxis.  Patient's chronic Xarelto had initially been held due to some hematuria since resumed.  Therapy evaluations completed and patient was admitted for a comprehensive rehab program.  Please see preadmission assessment from earlier today as  well.  Patient currently requires min- mod with basic self-care skills secondary to muscle weakness, decreased cardiorespiratoy endurance and decreased oxygen support, decreased coordination, decreased visual perceptual skills and hemianopsia, decreased attention to left, decreased memory and decreased sitting balance, decreased standing balance and decreased postural control.  Prior to hospitalization, patient could complete ADLs and IADLs with modified independent .  Patient will benefit from skilled intervention to increase independence with basic self-care skills and increase level of independence with iADL prior to discharge home with care partner.  Anticipate patient will require intermittent supervision and follow up outpatient.  OT - End of Session Activity Tolerance: Tolerates 30+ min activity with multiple rests OT Assessment Rehab Potential (ACUTE ONLY): Good OT Barriers to Discharge: Inaccessible home environment OT Barriers to Discharge Comments: full bathroom and bedroom on second floor OT Patient demonstrates impairments in the following area(s): Balance;Vision;Perception;Skin Integrity;Motor;Endurance;Cognition OT Basic ADL's Functional Problem(s): Grooming;Bathing;Dressing;Toileting OT Advanced ADL's Functional Problem(s): Simple Meal Preparation;Laundry OT Transfers Functional Problem(s): Toilet;Tub/Shower OT Additional Impairment(s): Fuctional Use of Upper Extremity OT Plan OT Intensity: Minimum of 1-2 x/day, 45 to 90 minutes OT Frequency: 5 out of 7 days OT Duration/Estimated Length of Stay: 7-10 days OT Treatment/Interventions: Balance/vestibular training;Cognitive remediation/compensation;Community reintegration;Discharge planning;DME/adaptive equipment instruction;Functional mobility training;Neuromuscular re-education;Patient/family education;Pain management;Psychosocial support;Self Care/advanced ADL retraining;Skin care/wound managment;UE/LE Strength  taining/ROM;Therapeutic Exercise;Therapeutic Activities;UE/LE Coordination activities;Visual/perceptual remediation/compensation;Wheelchair propulsion/positioning OT Self Feeding Anticipated Outcome(s): supervision OT Basic Self-Care Anticipated Outcome(s): supervision OT Toileting Anticipated Outcome(s): supervision OT Bathroom Transfers Anticipated Outcome(s): supervision OT Recommendation Patient destination: Home Follow Up Recommendations: Outpatient OT Equipment Recommended: Tub/shower bench;To be determined   Skilled Therapeutic Intervention Education provided on OT role and purpose, CIR, ELOS, POC, and glioblastoma recovery. Evaluation session with focus on ADL retraining, L attention, and functional transfers. Pt received semi-reclined in bed reporting no pain. Sat at EOB with CGA. Completed ambulatory transfers to TTB and w/c with minA and max multimodal cues for L attention, postural control, and anterior weight shift during sit <> stands. Completed showering at TTB with supervision to wash UB and modA to wash LB due to decreased functional reach. Educated on and demonstrated figure 4 bathing, pt with decreased ROM of BLE and unable to imitate. Facilitated multimodal cues for L attention while bathing. Facilitated L attention during dressing through set up of ADL items on left and max multimodal cueing. Donned pants with modA seated at w/c at sink with sit to stand level and required total assistance to don socks due to decreased functional reach and ROM of BLE. Completed oral care with supervision seated in w/c at sink.Notified NT about increasing bleeding on IV, NT present during session to remove IV. Donned shirt with minA and multimodal cues to orient shirt to self due to L inattention. Ended session with pt seated in w/c with alarm belt on and all needs within reach.   OT Evaluation Precautions/Restrictions  Precautions Precautions: Fall;Other (comment) Precaution Comments:  L  inattention Restrictions Weight Bearing Restrictions: No General   Vital Signs Therapy Vitals Pulse Rate: (!) 55 BP: 136/73 Oxygen Therapy SpO2: 98 % Pain Pain Assessment Pain Scale: 0-10 Pain Score: 0-No pain Home Living/Prior Functioning Home Living Family/patient expects to be discharged to:: Private residence Living Arrangements: Spouse/significant other Available Help at Discharge: Family, Available  24 hours/day Type of Home: House Home Access: Stairs to enter [employer] of Steps: 2 Entrance Stairs-Rails: Right Home Layout: Two level, Bed/bath upstairs Bathroom Shower/Tub: Walk-in shower, Architectural technologist: Handicapped height Bathroom Accessibility: Yes  Lives With: Spouse IADL History Homemaking Responsibilities: Yes Homemaking Comments: shares homemaking responsibilities with wife Current License: No (stopped driving in march once honomynous hemiopsia began) Occupation: Retired 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 [employer] and Hobbies: enjoys being outdoors Prior Function Level of Independence: Independent with basic ADLs, Independent with homemaking with ambulation (with cane)  Able to Take Stairs?: Yes Driving: No ADL ADL Grooming: Supervision/safety Where Assessed-Grooming: Sitting at sink Upper Body Bathing: Supervision/safety Where Assessed-Upper Body Bathing: Shower Lower Body Bathing: Minimal assistance Where Assessed-Lower Body Bathing: Shower Upper Body Dressing: Minimal assistance Where Assessed-Upper Body Dressing: Sitting at sink Lower Body Dressing: Moderate assistance Where Assessed-Lower Body Dressing: Sitting at sink Social research officer, government: Curator Method: Ambulating (with cane) Youth worker: Radio broadcast assistant Vision Baseline Vision/History: Wears glasses Wears Glasses: At all times Patient Visual Report:  (Pt reported increased homonymous hemiaopsia  since before admission) Vision Assessment?: Vision impaired- to be further tested in functional context Visual Fields: Left homonymous hemianopsia Perception  Perception: Impaired Inattention/Neglect: Does not attend to left visual field Praxis Praxis: Intact Cognition Overall Cognitive Status: Impaired/Different from baseline Arousal/Alertness: Awake/alert Orientation Level: Person;Place;Situation Person: Oriented Place: Oriented Situation: Oriented Year: 2021 Month: June Day of Week: Correct Memory: Appears intact Immediate Memory Recall: Sock;Blue;Bed Memory Recall Sock: With Cue Memory Recall Blue: With Cue Memory Recall Bed: Without Cue Awareness: Appears intact Problem Solving: Appears intact Safety/Judgment: Appears intact Comments: inattention to left Sensation Sensation Light Touch: Not tested Coordination Gross Motor Movements are Fluid and Coordinated: No Fine Motor Movements are Fluid and Coordinated: Yes Extremity/Trunk Assessment RUE Assessment RUE Assessment: Within Functional Limits LUE Assessment LUE Assessment: Exceptions to [facility] Active Range of Motion (AROM) Comments: WFL to wash neck/head General Strength Comments: decreased strength of LUE while b/d     Refer to Care Plan for Long Term Goals  Recommendations for other services: None    Discharge Criteria: Patient will be discharged from OT if patient refuses treatment 3 consecutive times without medical reason, if treatment goals not met, if there is a change in medical status, if patient makes no progress towards goals or if patient is discharged from hospital.  The above assessment, treatment plan, treatment alternatives and goals were discussed and mutually agreed upon: by patient  Michelle Nasuti 10/12/2019, 11:35 AM

## 2019-10-12 NOTE — Plan of Care (Signed)
  Problem: RH Balance Goal: LTG Patient will maintain dynamic standing balance (PT) Description: LTG:  Patient will maintain dynamic standing balance with assistance during mobility activities (PT) Flowsheets (Taken 10/12/2019 2132) LTG: Pt will maintain dynamic standing balance during mobility activities with:: Independent with assistive device    Problem: Sit to Stand Goal: LTG:  Patient will perform sit to stand with assistance level (PT) Description: LTG:  Patient will perform sit to stand with assistance level (PT) Flowsheets (Taken 10/12/2019 2132) LTG: PT will perform sit to stand in preparation for functional mobility with assistance level: Independent with assistive device   Problem: RH Bed to Chair Transfers Goal: LTG Patient will perform bed/chair transfers w/assist (PT) Description: LTG: Patient will perform bed to chair transfers with assistance (PT). Flowsheets (Taken 10/12/2019 2132) LTG: Pt will perform Bed to Chair Transfers with assistance level: Independent with assistive device    Problem: RH Car Transfers Goal: LTG Patient will perform car transfers with assist (PT) Description: LTG: Patient will perform car transfers with assistance (PT). Flowsheets (Taken 10/12/2019 2132) LTG: Pt will perform car transfers with assist:: Supervision/Verbal cueing   Problem: RH Furniture Transfers Goal: LTG Patient will perform furniture transfers w/assist (OT/PT) Description: LTG: Patient will perform furniture transfers  with assistance (OT/PT). Flowsheets (Taken 10/12/2019 2132) LTG: Pt will perform furniture transfers with assist:: Independent with assistive device    Problem: RH Ambulation Goal: LTG Patient will ambulate in controlled environment (PT) Description: LTG: Patient will ambulate in a controlled environment, # of feet with assistance (PT). Flowsheets (Taken 10/12/2019 2132) LTG: Pt will ambulate in controlled environ  assist needed:: Supervision/Verbal cueing LTG:  Ambulation distance in controlled environment: >200 ft using LRAD Goal: LTG Patient will ambulate in home environment (PT) Description: LTG: Patient will ambulate in home environment, # of feet with assistance (PT). Flowsheets (Taken 10/12/2019 2132) LTG: Pt will ambulate in home environ  assist needed:: Supervision/Verbal cueing LTG: Ambulation distance in home environment: 50 ft using LRAD Goal: LTG Patient will ambulate in community environment (PT) Description: LTG: Patient will ambulate in community environment, # of feet with assistance (PT). Flowsheets (Taken 10/12/2019 2132) LTG: Pt will ambulate in community environ  assist needed:: Supervision/Verbal cueing LTG: Ambulation distance in community environment: 150 ft using LRAD   Problem: RH Stairs Goal: LTG Patient will ambulate up and down stairs w/assist (PT) Description: LTG: Patient will ambulate up and down # of stairs with assistance (PT) Flowsheets (Taken 10/12/2019 2137) LTG: Pt will ambulate up/down stairs assist needed:: Supervision/Verbal cueing LTG: Pt will  ambulate up and down number of stairs: 2 steps with R rail and 16 steps with B rails for home access   Problem: RH Pre-functional/Other (Specify) Goal: RH LTG PT (Specify) 1 Description:  RH LTG PT (Specify) 1 Flowsheets (Taken 10/12/2019 2137) LTG: Other PT (Specify) 1: Patient will demonstrate independent use of visual scanning as a compensitory strategy for visual impairments for improved safety and awareness with functional mobility.

## 2019-10-12 NOTE — Evaluation (Signed)
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 Anxiety   . Arthritis   . ASCVD (arteriosclerotic cardiovascular disease)    03/2004: DES x2 to the RCA; IMI with stent occlusion in 02/2005- suboptimal Plavix compliance  . CAD (coronary artery disease)   . Carcinoma of prostate ([place])    Clopidogrel held for biopsy  . Constipation due to pain medication   . Diabetes mellitus without complication ([place])   . Diabetic neuropathy ([place])   . Headache 2021  . Hyperlipidemia   . Hypertension   . Mild depression ([place])   . Morbid obesity ([place])   . Myocardial infarction ([place])   . Shortness of breath dyspnea   . Sleep apnea    no longer uses cpap  . Stroke ([place]) 2017  . Tobacco abuse    stopped smoking 06/28/09   Past Surgical History:  Past Surgical History:  Procedure Laterality Date  . APPLICATION OF CRANIAL NAVIGATION N/A 07/04/2019   Procedure: APPLICATION OF CRANIAL NAVIGATION;  Surgeon: Ashok Pall, MD;  Location: [facility];  Service: Neurosurgery;  Laterality: N/A;  . BACK SURGERY    . CARDIAC CATHETERIZATION     X 1 stent before having CABG  . CORONARY ARTERY BYPASS GRAFT  06/28/2008   X4  . CRANIOTOMY Right 07/04/2019   Procedure: Right occipital craniotomy for tumor with brainlab;  Surgeon: Ashok Pall, MD;  Location: [facility];  Service: Neurosurgery;  Laterality: Right;  . ENDARTERECTOMY Right 07/03/2015   Procedure: RIGHT  CAROTID ENDARTERECTOMY WITH BOVINE PERICARDIUM PATCH ANGIOPLASTY;  Surgeon: Serafina Mitchell, MD;  Location: [facility];  Service: Vascular;  Laterality: Right;  . LUMBAR LAMINECTOMY/DECOMPRESSION MICRODISCECTOMY N/A 09/06/2014   Procedure: LUMBER DECOMPRESSION L3-5;  Surgeon: Melina Schools, MD;  Location: [facility];  Service: Orthopedics;  Laterality: N/A;  . LUMBAR SPINE SURGERY  2002  . PROSTATE BIOPSY    . PROSTATECTOMY  02/2007  . VASECTOMY      Assessment & Plan Clinical Impression: Patient is a 74 y.o. 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, prostate cancer status post radiation as well as prostatectomy, CAD with CABG 2010 maintained on Xarelto, chronic diastolic congestive heart failure, type 2 diabetes mellitus, hypertension, hyperlipidemia, sleep apnea not on CPAP.  History taken from chart review, patient, and wife.  Patient lives with spouse.  Two-level home bed and bath upstairs.  He had been receiving outpatient therapies at neuro rehab.  He presented on 09/2019 with increasing left hemiparesis, headache, nausea vomiting as well as bouts of hematuria.  Admission chemistries with sodium 132, glucose 171, creatinine 0.88, hemoglobin 12.7, urinalysis negative nitrite.  CT of the head showed new severe vasogenic edema throughout the posterior right cerebral hemisphere with 8 mm leftward midline shift.  No evidence of acute intracranial abnormality.  Follow-up medical oncology services maintained on Decadron therapy.  Palliative care consulted  for goals of care.  Patient remains on [place] for seizure prophylaxis.  Patient's chronic Xarelto had initially been held due to some hematuria since resumed.  Therapy evaluations completed and patient was admitted for a comprehensive rehab program.  Please see preadmission  assessment from earlier today as well. Patient transferred to CIR on 10/11/2019 .   Patient currently requires min with mobility secondary to muscle weakness, decreased cardiorespiratoy endurance, abnormal tone, ataxia and decreased coordination, hemianopsia, decreased attention to left, decreased awareness and decreased safety awareness and decreased standing balance, decreased postural control, hemiplegia and decreased balance strategies.  Prior to hospitalization, patient was modified independent  with mobility and lived with Spouse in a House home.  Home access is 2Stairs to enter.  Patient will benefit from skilled PT intervention to maximize safe functional mobility, minimize fall risk and decrease caregiver burden for planned discharge home with intermittent assist, supervision with short periods without assist.  Anticipate patient will benefit from follow up OP at discharge.  PT - End of Session Activity Tolerance: Tolerates 30+ min activity with multiple rests Endurance Deficit: Yes PT Assessment Rehab Potential (ACUTE/IP ONLY): Good PT Barriers to Discharge: Home environment access/layout;Inaccessible home environment;Behavior;Lack of/limited family support PT Barriers to Discharge Comments: patient has 2 STE home and 13 steps with B rails to access bed/bathroom, patient's wife is primary caregiver and can only provide supervision assist, patient with L visual field deficits and L inattention PT Patient demonstrates impairments in the following area(s): Balance;Behavior;Edema;Endurance;Motor;Nutrition;Pain;Perception;Safety;Sensory;Skin Integrity PT Transfers Functional Problem(s): Bed Mobility;Bed to Chair;Car;Furniture;Floor PT Locomotion Functional Problem(s): Ambulation;Wheelchair Mobility;Stairs PT Plan PT Intensity: Minimum of 1-2 x/day ,45 to 90 minutes PT Frequency: 5 out of 7 days PT Duration Estimated Length of Stay: 7-10 days PT Treatment/Interventions: Ambulation/gait  training;Cognitive remediation/compensation;Discharge planning;DME/adaptive equipment instruction;Functional mobility training;Pain management;Psychosocial support;Splinting/orthotics;Therapeutic Activities;UE/LE Strength taining/ROM;Visual/perceptual remediation/compensation;Balance/vestibular training;Community reintegration;Disease management/prevention;Functional electrical stimulation;Neuromuscular re-education;Patient/family education;Skin care/wound management;Stair training;Therapeutic Exercise;UE/LE Coordination activities;Wheelchair propulsion/positioning PT Transfers Anticipated Outcome(s): Mod I using LRAD PT Locomotion Anticipated Outcome(s): Supervision using LRAD >200 ft PT Recommendation Recommendations for Other Services: Therapeutic Recreation consult;Neuropsych consult Therapeutic Recreation Interventions: Stress management Follow Up Recommendations: Outpatient PT Patient destination: Home Equipment Recommended: To be determined Equipment Details: Patient has [place], will determine DME needs based on patient progress  Skilled Therapeutic Intervention In addition to the PT evaluation below, the patient performed the following skilled PT interventions: Patient in w/c with his wife in the room upon PT arrival. Patient alert and agreeable to PT session. Patient reported mild low back pain radiating to R hip during gait training, RN made aware. PT provided repositioning, rest breaks, and distraction as pain interventions throughout session.   Therapeutic Activity: Bed Mobility: Patient performed rolling L/R and supine to/from sit independently on an elevated mat table to simulate home set-up.  Transfers: Patient performed sit to/from stand and stand pivot transfers with CGA progressing to close supervision with intervention without AD. Provided verbal cues for forward weight shift for improved balance with standing and controled descent while reaching back to sit for safety. Patient  performed a simulated sedan height car transfer with min A using car frame and door for support. Educated on risk of door moving and causing LOB and suggested using of car seat for boosting up for increased stability. Patient in agreement.  Gait Training:  Patient ambulated 180 feet without AD with min A for balance and facilitation of L weight shift. He then ambulated 120 feet, limited by fatigue, using a RW with CGA-close supervision. Ambulated as  described below, with poor awareness to L turns and objects on the L and improved balance with RW. Provided verbal cues during second trial for increased L weight shift, L visual scanning, and increased step height for safety. Patient ascended/descended 16 steps using B rails with CGA for balance/safety. Performed step-to gait pattern leading with R while ascending and L while descending without cues.  Patient ambulated up/down a ramp, over 10 feet of mulch (unlevel surface), and up/down a curb to simulate community ambulation over unlevel surfaces with min A for steadying assist using 1 rail on curb otherwise no Ad.   Wheelchair Mobility:  Patient propelled wheelchair 155 feet with min A-CGA due to veering to the L throughout requiring steering assist.   Neuromuscular Re-ed: Patient performed the Berg Balance Scale: Patient demonstrates increased fall risk as noted by score of 41/56 on Berg Balance Scale.  (<36= high risk for falls, close to 100%; 37-45 significant >80%; 46-51 moderate >50%; 52-55 lower >25%)  Patient in w/c with his wife in the room at end of session with breaks locked, seat belt alarm set, and all needs within reach.   Instructed pt in results of PT evaluation as detailed below, PT POC, rehab potential, rehab goals, and discharge recommendations. Additionally discussed CIR's policies regarding fall safety and use of chair alarm and/or quick release belt. Pt verbalized understanding and in agreement. Will update pt's family members as  they become available.    PT Evaluation Precautions/Restrictions Precautions Precautions: Fall;Other (comment) Precaution Comments:  L inattention Restrictions Weight Bearing Restrictions: No Home Living/Prior Functioning Home Living Available Help at Discharge: Family;Available 24 hours/day Type of Home: House Home Access: Stairs to enter [employer] of Steps: 2 Entrance Stairs-Rails: Right Home Layout: Two level;Bed/bath upstairs Bathroom Shower/Tub: Walk-in shower;Curtain Bathroom Accessibility: Yes  Lives With: Spouse Prior Function Level of Independence: Independent with basic ADLs;Independent with homemaking with ambulation;Independent with gait;Requires assistive device for independence;Independent with transfers (with cane)  Able to Take Stairs?: Yes Driving: No Vocation: Retired 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, 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, does not ride them Vision/Perception  Vision - Risk analyst: Within Passenger transport manager Range of Motion: Within Functional Limits Alignment/Gaze Preference: Within Defined Limits Tracking/Visual Pursuits: Able to track stimulus in all quads without difficulty Saccades: Within functional limits Convergence: Impaired (comment) (>10 cm distance from finger to nose during testing) Perception Perception: Impaired Inattention/Neglect: Does not attend to left visual field Praxis Praxis: Intact  Cognition Overall Cognitive Status: Impaired/Different from baseline Arousal/Alertness: Awake/alert Memory: Appears intact Awareness: Appears intact Problem Solving: Appears intact Safety/Judgment: Appears intact Comments: inattention to left Sensation Sensation Light Touch: Appears Intact Proprioception: Impaired Detail Proprioception Impaired Details: Impaired LLE Additional Comments: mild L LE ataxia with inattention during gait Coordination Gross Motor Movements are Fluid and Coordinated: No Fine  Motor Movements are Fluid and Coordinated: Yes Coordination and Movement Description: mild L LE weakness and balance deficits affecting functional mobility Heel Shin Test: WFL, L with decreased ROM due to hip flexor weakness Motor  Motor Motor: Ataxia;Hemiplegia Motor - Skilled Clinical Observations: mild L LE weakness and ataxia with functional mobility  Mobility Bed Mobility Bed Mobility: Rolling Right;Rolling Left;Supine to Sit;Sit to Supine Rolling Right: Independent Rolling Left: Independent Supine to Sit: Independent Sit to Supine: Independent Transfers Transfers: Sit to Stand;Stand to Sit;Stand Pivot Transfers Sit to Stand: Contact Guard/Touching assist Stand to Sit: Contact Guard/Touching assist Stand Pivot Transfers: Contact Guard/Touching assist Transfer (Assistive device): None Locomotion  Gait Ambulation:  Yes Gait Assistance: Minimal Assistance - Patient > 75% Gait Distance (Feet): 180 Feet Assistive device: None Gait Assistance Details: Manual facilitation for weight shifting Gait Gait: Yes Gait Pattern: Step-to pattern;Step-through pattern;Decreased stance time - left;Decreased stride length;Decreased hip/knee flexion - right;Decreased hip/knee flexion - left;Decreased weight shift to left;Ataxic;Decreased trunk rotation;Trunk flexed;Wide base of support Gait velocity: decreased Stairs / Additional Locomotion Stairs: Yes Stairs Assistance: Contact Guard/Touching assist Stair Management Technique: Two rails Number of Stairs: 16 Height of Stairs: 6 Ramp: Minimal Assistance - Patient >75% Curb: Minimal Assistance - Patient >75% Wheelchair Mobility Wheelchair Mobility: Yes Wheelchair Assistance: Minimal assistance - Patient >75% Wheelchair Propulsion: Both upper extremities Wheelchair Parts Management: Needs assistance Distance: 155 ft  Trunk/Postural Assessment  Cervical Assessment Cervical Assessment: Exceptions to [facility] (forward head) Thoracic  Assessment Thoracic Assessment: Exceptions to [facility] (kyphosis) Lumbar Assessment Lumbar Assessment: Exceptions to [facility] (posterior pelvic tilt) Postural Control Postural Control: Deficits on evaluation (decreased/delayed)  Balance Balance Balance Assessed: Yes Standardized Balance Assessment Standardized Balance Assessment: Berg Balance Test Berg Balance Test Sit to Stand: Able to stand without using hands and stabilize independently Standing Unsupported: Able to stand safely 2 minutes Sitting with Back Unsupported but Feet Supported on Floor or Stool: Able to sit safely and securely 2 minutes Stand to Sit: Sits safely with minimal use of hands Transfers: Able to transfer safely, minor use of hands Standing Unsupported with Eyes Closed: Able to stand 10 seconds safely Standing Ubsupported with Feet Together: Able to place feet together independently and stand for 1 minute with supervision From Standing, Reach Forward with Outstretched Arm: Can reach forward >12 cm safely (5") From Standing Position, Pick up Object from Floor: Able to pick up shoe, needs supervision From Standing Position, Turn to Look Behind Over each Shoulder: Turn sideways only but maintains balance (limited by back pain) Turn 360 Degrees: Needs close supervision or verbal cueing Standing Unsupported, Alternately Place Feet on Step/Stool: Able to complete >2 steps/needs minimal assist Standing Unsupported, One Foot in Front: Able to plae foot ahead of the other independently and hold 30 seconds Standing on One Leg: Tries to lift leg/unable to hold 3 seconds but remains standing independently Total Score: 41 Static Sitting Balance Static Sitting - Balance Support: No upper extremity supported;Feet unsupported Static Sitting - Level of Assistance: 7: Independent Dynamic Sitting Balance Dynamic Sitting - Balance Support: No upper extremity supported;Feet supported Dynamic Sitting - Level of Assistance: 5: Stand by  assistance Dynamic Sitting - Balance Activities: Lateral lean/weight shifting;Forward lean/weight shifting;Reaching for objects Static Standing Balance Static Standing - Balance Support: No upper extremity supported;During functional activity Static Standing - Level of Assistance: 5: Stand by assistance Dynamic Standing Balance Dynamic Standing - Balance Support: No upper extremity supported;During functional activity Dynamic Standing - Level of Assistance: 4: Min assist Dynamic Standing - Balance Activities: Lateral lean/weight shifting;Forward lean/weight shifting;Reaching for objects Extremity Assessment  RUE Assessment RUE Assessment: Within Functional Limits LUE Assessment LUE Assessment: Exceptions to [facility] Active Range of Motion (AROM) Comments: WFL to wash neck/head General Strength Comments: decreased strength of LUE while b/d RLE Assessment RLE Assessment: Within Functional Limits Active Range of Motion (AROM) Comments: Grossly WFL with functional mobility General Strength Comments: Grossly in sitting: 5/5 throughout, except hip flexion 4+/5 LLE Assessment LLE Assessment: Exceptions to [facility] Active Range of Motion (AROM) Comments: limited active hip flexion due to hip flexor weakness General Strength Comments: Grossly in sitting: hip flexion 2+/5, knee extension/flexion 4+/5, DF 4/5, PF 5/5    Refer  to Care Plan for Long Term Goals  Recommendations for other services: Neuropsych and Therapeutic Recreation  Stress management  Discharge Criteria: Patient will be discharged from PT if patient refuses treatment 3 consecutive times without medical reason, if treatment goals not met, if there is a change in medical status, if patient makes no progress towards goals or if patient is discharged from hospital.  The above assessment, treatment plan, treatment alternatives and goals were discussed and mutually agreed upon: by patient and by family  Doreene Burke PT,  DPT  10/12/2019, 4:04 PM

## 2019-10-12 NOTE — Progress Notes (Signed)
Occupational Therapy Session Note  Patient Details  Name: [name] MRN: [id number] Date of Birth: 23-Sep-1945  Today's Date: 10/12/2019 OT Individual Time: 7902-4097 OT Individual Time Calculation (min): 69 min    Short Term Goals: Week 1:  OT Short Term Goal 1 (Week 1): Pt will don pants with CGA demonstrating decreased posterior bias. OT Short Term Goal 2 (Week 1): Pt will visually scan to the L with min verbal cues while completing a grooming task. OT Short Term Goal 3 (Week 1): Pt will don socks with minA with LRAD.  Skilled Therapeutic Interventions/Progress Updates:  Treatment session with focus on ADL retraining, L attention, and functional transfers. Pt received semi-reclined in bed reporting no pain. Sat at EOB with CGA. Completed ambulatory transfers to TTB and w/c with CGA and max multimodal cues for L attention, postural control, and anterior weight shift during sit <> stands. Completed showering at TTB with supervision to wash UB and modA to wash LB due to decreased functional reach. Educated on and demonstrated figure 4 bathing, pt with decreased ROM of BLE and unable to imitate. Facilitated multimodal cues for L attention while bathing. Facilitated L attention during dressing through set up and multimodal cueing. Pt required max cues to attend to clothes on L side. Donned pants with modA seated at w/c at sink with sit to stand level and required total assistance to don socks due to decreased functional reach and ROM of BLE. Completed oral care with supervision seated in w/c at sink.Notified NT about increasing bleeding on IV, NT present during session to remove IV. Donned shirt with minA and multimodal cues to thread shirt through correct shirt holes due to L inattention. Ended session with pt seated in w/c with alarm belt on and all needs within reach.   Therapy Documentation Precautions:  Precautions Precautions: Fall, Other (comment) Precaution Comments:  L  inattention Restrictions Weight Bearing Restrictions: No Vital Signs: Therapy Vitals Pulse Rate: (!) 55 BP: 136/73 Oxygen Therapy SpO2: 98 % Pain: Pain Assessment Pain Scale: 0-10 Pain Score: 0-No pain  Therapy/Group: Individual Therapy  Michelle Nasuti 10/12/2019, 11:48 AM

## 2019-10-12 NOTE — Evaluation (Signed)
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 Anxiety   . Arthritis   . ASCVD (arteriosclerotic cardiovascular disease)    03/2004: DES x2 to the RCA; IMI with stent occlusion in 02/2005- suboptimal Plavix compliance  . CAD (coronary artery disease)   . Carcinoma of prostate ([place])    Clopidogrel held for biopsy  . Constipation due to pain medication   . Diabetes mellitus without complication ([place])   . Diabetic neuropathy ([place])   . Headache 2021  . Hyperlipidemia   . Hypertension   . Mild depression ([place])   . Morbid obesity ([place])   . Myocardial infarction ([place])   . Shortness of breath dyspnea   . Sleep apnea    no longer uses cpap  . Stroke ([place]) 2017  . Tobacco abuse    stopped smoking 06/28/09   Past Surgical History:  Past Surgical History:  Procedure Laterality Date  . APPLICATION OF CRANIAL NAVIGATION N/A 07/04/2019   Procedure: APPLICATION OF CRANIAL NAVIGATION;  Surgeon: Ashok Pall, MD;  Location: [facility];  Service: Neurosurgery;  Laterality: N/A;  . BACK SURGERY    . CARDIAC CATHETERIZATION     X 1 stent before having CABG  . CORONARY ARTERY BYPASS GRAFT  06/28/2008   X4  . CRANIOTOMY Right 07/04/2019   Procedure: Right occipital craniotomy for tumor with brainlab;  Surgeon: Ashok Pall, MD;  Location: [facility];  Service: Neurosurgery;  Laterality: Right;  . ENDARTERECTOMY Right 07/03/2015   Procedure: RIGHT CAROTID ENDARTERECTOMY WITH BOVINE PERICARDIUM PATCH ANGIOPLASTY;  Surgeon: Serafina Mitchell, MD;  Location: [facility];  Service: Vascular;  Laterality: Right;   . LUMBAR LAMINECTOMY/DECOMPRESSION MICRODISCECTOMY N/A 09/06/2014   Procedure: LUMBER DECOMPRESSION L3-5;  Surgeon: Melina Schools, MD;  Location: [facility];  Service: Orthopedics;  Laterality: N/A;  . LUMBAR SPINE SURGERY  2002  . PROSTATE BIOPSY    . PROSTATECTOMY  02/2007  . 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, prostate cancer status post radiation as well as prostatectomy, CAD with CABG 2010 maintained on Xarelto, chronic diastolic congestive heart failure, type 2 diabetes mellitus, hypertension, hyperlipidemia, sleep apnea not on CPAP.  History taken from chart review, patient, and wife.  Patient lives with spouse.  Two-level home bed and bath upstairs.  He had been receiving outpatient therapies at neuro rehab.  He presented on 09/2019 with increasing left hemiparesis, headache, nausea vomiting as well as bouts of hematuria.  Admission chemistries with sodium 132, glucose 171, creatinine 0.88, hemoglobin 12.7, urinalysis negative nitrite.  CT of the head showed new severe vasogenic edema throughout the posterior right cerebral hemisphere with 8 mm leftward midline shift.  No evidence of acute intracranial abnormality.  Follow-up medical oncology services maintained on Decadron therapy.  Palliative care consulted for goals of care.  Patient remains on [place] for seizure prophylaxis.  Patient's chronic Xarelto had initially been held due to  some hematuria since resumed.  Therapy evaluations completed and patient was admitted for a comprehensive rehab program 10/11/19.  Patient administered the Cognistat and scored WFL on all subtests with the exception of severe deficits in visual construction. Throughout informal assessment,  patient's overall cognitive-linguistic function appeared [facility] the exception of impaired visual scanning/attention to left field of environment which impacts his safety with functional and familiar tasks. Patient would benefit from skilled SLP intervention to maximize his cognitive functioning and overall functional independence prior to discharge.     Skilled Therapeutic Interventions          Administered a cognitive-linguistic evaluation, please see above for details.   SLP Assessment  Patient will need skilled [facility]ology Services during CIR admission    Recommendations  Oral Care Recommendations: Oral care BID Recommendations for Other Services: Neuropsych consult Patient destination: Home Follow up Recommendations: Outpatient SLP;24 hour supervision/assistance Equipment Recommended: None recommended by SLP    SLP Frequency 3 to 5 out of 7 days   SLP Duration  SLP Intensity  SLP Treatment/Interventions 7-10 days  Minumum of 1-2 x/day, 30 to 90 minutes  Cognitive remediation/compensation;Cueing hierarchy;Environmental controls;Therapeutic Activities;Functional tasks;Patient/family education    Pain No/Denies Pain   SLP Evaluation Cognition Overall Cognitive Status: Impaired/Different from baseline Arousal/Alertness: Awake/alert Orientation Level: Oriented X4 Memory: Appears intact Awareness: Appears intact Problem Solving: Appears intact Safety/Judgment: Appears intact Comments: inattention to left  Comprehension Auditory Comprehension Overall Auditory Comprehension: Appears within functional limits for tasks assessed Visual Recognition/Discrimination Discrimination: Not tested Reading Comprehension Reading Status: Not tested Expression Expression Primary Mode of Expression: Verbal Verbal Expression Overall Verbal Expression: Appears within functional limits for tasks assessed Written Expression Dominant Hand: Right Written Expression: Not  tested Oral Motor Oral Motor/Sensory Function Overall Oral Motor/Sensory Function: Within functional limits Motor Speech Overall Motor Speech: Appears within functional limits for tasks assessed  Short Term Goals: Week 1: SLP Short Term Goal 1 (Week 1): STGs=LTGs due to ELOS  Refer to Care Plan for Long Term Goals  Recommendations for other services: Neuropsych  Discharge Criteria: Patient will be discharged from SLP if patient refuses treatment 3 consecutive times without medical reason, if treatment goals not met, if there is a change in medical status, if patient makes no progress towards goals or if patient is discharged from hospital.  The above assessment, treatment plan, treatment alternatives and goals were discussed and mutually agreed upon: by patient  Illyria Sobocinski 10/12/2019, 9:43 AM

## 2019-10-12 NOTE — Plan of Care (Signed)
  Problem: Consults Goal: RH BRAIN INJURY PATIENT EDUCATION Description: Description: See Patient Education module for eduction specifics Outcome: Progressing   Problem: Consults Goal: Nutrition Consult-if indicated Outcome: Progressing Goal: Diabetes Guidelines if Diabetic/Glucose > 140 Description: If diabetic or lab glucose is > 140 mg/dl - Initiate Diabetes/Hyperglycemia Guidelines & Document Interventions  Outcome: Progressing   Problem: RH BOWEL ELIMINATION Goal: RH STG MANAGE BOWEL WITH ASSISTANCE Description: STG Manage Bowel with mod Assistance. Outcome: Progressing Goal: RH STG MANAGE BOWEL W/MEDICATION W/ASSISTANCE Description: STG Manage Bowel with Medication with min Assistance. Outcome: Progressing   Problem: RH BLADDER ELIMINATION Goal: RH STG MANAGE BLADDER WITH ASSISTANCE Description: STG Manage Bladder With min Assistance Outcome: Progressing   Problem: RH SKIN INTEGRITY Goal: RH STG SKIN FREE OF INFECTION/BREAKDOWN Description: No new skin breakdown while on rehab with min assist. Outcome: Progressing Goal: RH STG MAINTAIN SKIN INTEGRITY WITH ASSISTANCE Description: STG Maintain Skin Integrity With min Assistance. Outcome: Progressing Goal: RH STG ABLE TO PERFORM INCISION/WOUND CARE W/ASSISTANCE Description: STG Able To Perform Incision/Wound Care With min Assistance. Outcome: Progressing   Problem: RH SAFETY Goal: RH STG ADHERE TO SAFETY PRECAUTIONS W/ASSISTANCE/DEVICE Description: STG Adhere to Safety Precautions With mod Assistance and appropriate Device. Outcome: Progressing   Problem: RH PAIN MANAGEMENT Goal: RH STG PAIN MANAGED AT OR BELOW PT'S PAIN GOAL Description: <4 on a 0-10 pain scale. Outcome: Progressing   Problem: RH KNOWLEDGE DEFICIT BRAIN INJURY Goal: RH STG INCREASE KNOWLEDGE OF SELF CARE AFTER BRAIN INJURY Description: Pt and caregiver will demonstrate knowledge on medication management, dietary management, BP control, blood  glucose control, safety at home and in the community, and follow up care with the MD post discharge from CIR with min assist from staff. Outcome: Progressing

## 2019-10-12 NOTE — Discharge Instructions (Addendum)
Inpatient Rehab Discharge Instructions  JAVYON FONTAN Discharge date and time: No discharge date for patient encounter.   Activities/Precautions/ Functional Status: Activity: activity as tolerated Diet: regular diet Wound Care: none needed Functional status:  ___ No restrictions     ___ Walk up steps independently ___ 24/7 supervision/assistance   ___ Walk up steps with assistance ___ Intermittent supervision/assistance  ___ Bathe/dress independently ___ Walk with walker     _x__ Bathe/dress with assistance ___ Walk Independently    ___ Shower independently ___ Walk with assistance    ___ Shower with assistance ___ No alcohol     ___ Return to work/school ________   COMMUNITY REFERRALS UPON DISCHARGE:     Outpatient: PT     OT     ST             Agency: [facility] Phone: [phone]             Appointment Date/Time:*Please expect follow-up within 7-10 business days to resume therapy. If you have not received follow-up, be sure to contact site directly.*  Medical Equipment/Items Ordered: shower chair and rolling walker                                                 Agency/Supplier: [employer]    Special Instructions: No driving smoking or alcohol   My questions have been answered and I understand these instructions. I will adhere to these goals and the provided educational materials after my discharge from the hospital.  Patient/Caregiver Signature _______________________________ Date __________  Clinician Signature _______________________________________ Date __________  Please bring this form and your medication list with you to all your follow-up doctor's appointments.    Information on my medicine - XARELTO (rivaroxaban)  This medication education was reviewed with me or my healthcare representative as part of my discharge preparation.    WHY WAS XARELTO PRESCRIBED FOR YOU? Xarelto was prescribed to treat blood clots that may have been found in the  veins of your legs (deep vein thrombosis) or in your lungs (pulmonary embolism) and to reduce the risk of them occurring again.  What do you need to know about Xarelto? The dose is one 20 mg tablet taken ONCE A DAY with your evening meal.  DO NOT stop taking Xarelto without talking to the health care provider who prescribed the medication.  Refill your prescription for 20 mg tablets before you run out.  After discharge, you should have regular check-up appointments with your healthcare provider that is prescribing your Xarelto.  In the future your dose may need to be changed if your kidney function changes by a significant amount.  What do you do if you miss a dose? If you are taking Xarelto TWICE DAILY and you miss a dose, take it as soon as you remember. You may take two 15 mg tablets (total 30 mg) at the same time then resume your regularly scheduled 15 mg twice daily the next day.  If you are taking Xarelto ONCE DAILY and you miss a dose, take it as soon as you remember on the same day then continue your regularly scheduled once daily regimen the next day. Do not take two doses of Xarelto at the same time.   Important Safety Information Xarelto is a blood thinner medicine that can cause bleeding. You should call your healthcare provider  right away if you experience any of the following: ? Bleeding from an injury or your nose that does not stop. ? Unusual colored urine (red or dark brown) or unusual colored stools (red or black). ? Unusual bruising for unknown reasons. ? A serious fall or if you hit your head (even if there is no bleeding).  Some medicines may interact with Xarelto and might increase your risk of bleeding while on Xarelto. To help avoid this, consult your healthcare provider or pharmacist prior to using any new prescription or non-prescription medications, including herbals, vitamins, non-steroidal anti-inflammatory drugs (NSAIDs) and supplements.  This website has  more information on Xarelto: [email or web address]

## 2019-10-13 ENCOUNTER — Inpatient Hospital Stay (HOSPITAL_COMMUNITY): Payer: Medicare Other | Admitting: Speech Pathology

## 2019-10-13 ENCOUNTER — Ambulatory Visit: Payer: Medicare Other

## 2019-10-13 ENCOUNTER — Other Ambulatory Visit: Payer: Self-pay | Admitting: *Deleted

## 2019-10-13 ENCOUNTER — Inpatient Hospital Stay (HOSPITAL_COMMUNITY): Payer: Medicare Other

## 2019-10-13 ENCOUNTER — Inpatient Hospital Stay (HOSPITAL_COMMUNITY): Payer: Medicare Other | Admitting: Physical Therapy

## 2019-10-13 LAB — GLUCOSE, CAPILLARY
Glucose-Capillary: 157 mg/dL — ABNORMAL HIGH (ref 70–99)
Glucose-Capillary: 188 mg/dL — ABNORMAL HIGH (ref 70–99)
Glucose-Capillary: 161 mg/dL — ABNORMAL HIGH (ref 70–99)
Glucose-Capillary: 245 mg/dL — ABNORMAL HIGH (ref 70–99)

## 2019-10-13 MED ORDER — METOPROLOL SUCCINATE ER 25 MG PO TB24
12.5000 mg | ORAL_TABLET | Freq: Every evening | ORAL | Status: DC
  Administered 2019-10-13 – 2019-10-17 (×5): 12.5 mg via ORAL
  Filled 2019-10-13 (×5): qty 1

## 2019-10-13 MED ORDER — INSULIN DETEMIR 100 UNIT/ML ~~LOC~~ SOLN
6.0000 [IU] | Freq: Two times a day (BID) | SUBCUTANEOUS | Status: DC
  Administered 2019-10-13 – 2019-10-14 (×2): 6 [IU] via SUBCUTANEOUS
  Filled 2019-10-13 (×4): qty 0.06

## 2019-10-13 NOTE — Plan of Care (Signed)
  Problem: Consults Goal: RH BRAIN INJURY PATIENT EDUCATION Description: Description: See Patient Education module for eduction specifics Outcome: Progressing   Problem: Consults Goal: Nutrition Consult-if indicated Outcome: Progressing Goal: Diabetes Guidelines if Diabetic/Glucose > 140 Description: If diabetic or lab glucose is > 140 mg/dl - Initiate Diabetes/Hyperglycemia Guidelines & Document Interventions  Outcome: Progressing   Problem: RH BOWEL ELIMINATION Goal: RH STG MANAGE BOWEL WITH ASSISTANCE Description: STG Manage Bowel with mod Assistance. Outcome: Progressing Goal: RH STG MANAGE BOWEL W/MEDICATION W/ASSISTANCE Description: STG Manage Bowel with Medication with min Assistance. Outcome: Progressing   Problem: RH BLADDER ELIMINATION Goal: RH STG MANAGE BLADDER WITH ASSISTANCE Description: STG Manage Bladder With min Assistance Outcome: Progressing   Problem: RH SKIN INTEGRITY Goal: RH STG SKIN FREE OF INFECTION/BREAKDOWN Description: No new skin breakdown while on rehab with min assist. Outcome: Progressing Goal: RH STG MAINTAIN SKIN INTEGRITY WITH ASSISTANCE Description: STG Maintain Skin Integrity With min Assistance. Outcome: Progressing Goal: RH STG ABLE TO PERFORM INCISION/WOUND CARE W/ASSISTANCE Description: STG Able To Perform Incision/Wound Care With min Assistance. Outcome: Progressing   Problem: RH SAFETY Goal: RH STG ADHERE TO SAFETY PRECAUTIONS W/ASSISTANCE/DEVICE Description: STG Adhere to Safety Precautions With mod Assistance and appropriate Device. Outcome: Progressing   Problem: RH PAIN MANAGEMENT Goal: RH STG PAIN MANAGED AT OR BELOW PT'S PAIN GOAL Description: <4 on a 0-10 pain scale. Outcome: Progressing   Problem: RH KNOWLEDGE DEFICIT BRAIN INJURY Goal: RH STG INCREASE KNOWLEDGE OF SELF CARE AFTER BRAIN INJURY Description: Pt and caregiver will demonstrate knowledge on medication management, dietary management, BP control, blood  glucose control, safety at home and in the community, and follow up care with the MD post discharge from CIR with min assist from staff. Outcome: Progressing   Problem: RH Vision Goal: RH LTG Vision (Specify) Outcome: Progressing

## 2019-10-13 NOTE — Progress Notes (Signed)
Educated patient on the importance of restarting Xarelto. Patients oncologist temporarily discontinued use because of hematuria. Hematuria has now stopped, patient is ok to restart use. Patient agreed to restarting. Will continue to monitor.  Amanda Cockayne, LPN

## 2019-10-13 NOTE — Progress Notes (Signed)
Occupational Therapy Session Note  Patient Details  Name: [name] MRN: [id number] Date of Birth: [birth year]  Today's Date: 10/13/2019 OT Individual Time: 1135-1200 OT Individual Time Calculation (min): 25 min    Short Term Goals: Week 1:  OT Short Term Goal 1 (Week 1): Pt will don pants with CGA demonstrating decreased posterior bias. OT Short Term Goal 2 (Week 1): Pt will visually scan to the L with min verbal cues while completing a grooming task. OT Short Term Goal 3 (Week 1): Pt will don socks with minA with LRAD.  Skilled Therapeutic Interventions/Progress Updates:    1:1. Pt received in w/c agreeable to OT. Pt completes ambulatory transfer with S and RW with VC for keeping all 4 points of RW on floor despite noise for pt to transfer onto toilet. Pt completes 3/3 components of toileting in standing with CGA. Mobility in hallway to dayroom with RW (S) and back to room no AD with (CGA) with VC for L visual scanning. Pt completes standing bean bag toss with CGA reaching across midline to locate bean bags on L in mod ranges outside BOS to challenge dynamic balance in prep for BADLs. Exited session with pt seated in w/c exit alarm on and call light in reach  Therapy Documentation Precautions:  Precautions Precautions: Fall, Other (comment) Precaution Comments:  L inattention Restrictions Weight Bearing Restrictions: No General:   Vital Signs:  Pain:   ADL: ADL Grooming: Supervision/safety Where Assessed-Grooming: Sitting at sink Upper Body Bathing: Supervision/safety Where Assessed-Upper Body Bathing: Shower Lower Body Bathing: Minimal assistance Where Assessed-Lower Body Bathing: Shower Upper Body Dressing: Minimal assistance Where Assessed-Upper Body Dressing: Sitting at sink Lower Body Dressing: Moderate assistance Where Assessed-Lower Body Dressing: Sitting at sink Social research officer, government: Curator Method: Ambulating (with  cane) Youth worker: Nurse, learning disability    Praxis   Exercises:   Other Treatments:     Therapy/Group: Individual Therapy  Tonny Branch 10/13/2019, 12:05 PM

## 2019-10-13 NOTE — Care Management (Signed)
[facility] Individual Statement of Services  Patient Name:  [name]  Date:  10/13/2019  Welcome to the [facility].  Our goal is to provide you with an individualized program based on your diagnosis and situation, designed to meet your specific needs.  With this comprehensive rehabilitation program, you will be expected to participate in at least 3 hours of rehabilitation therapies Monday-Friday, with modified therapy programming on the weekends.  Your rehabilitation program will include the following services:  Physical Therapy (PT), Occupational Therapy (OT), 24 hour per day rehabilitation nursing, Therapeutic Recreaction (TR), Psychology, Neuropsychology, Care Coordinator, Rehabilitation Medicine, Nutrition Services, Pharmacy Services and Other  Weekly team conferences will be held on Tuesdays to discuss your progress.  Your Inpatient Rehabilitation Care Coordinator will talk with you frequently to get your input and to update you on team discussions.  Team conferences with you and your family in attendance may also be held.  Expected length of stay: 7-10 days    Overall anticipated outcome: Supervision  Depending on your progress and recovery, your program may change. Your Inpatient Rehabilitation Care Coordinator will coordinate services and will keep you informed of any changes. Your Inpatient Rehabilitation Care Coordinator's name and contact numbers are listed  below.  The following services may also be recommended but are not provided by the [facility] will be made to provide these services after discharge if needed.  Arrangements include referral to agencies that provide these services.  Your insurance has been verified to be:  Med  Your primary doctor is:  Maurice Small  Pertinent information will be shared with your doctor and your insurance company.  Inpatient Rehabilitation Care Coordinator:  Cathleen Corti [phone] or ([phone]  Information discussed with and copy given to patient by: Rana Snare, 10/13/2019, 8:43 AM

## 2019-10-13 NOTE — Progress Notes (Signed)
Physical Therapy Session Note  Patient Details  Name: [name] MRN: [id number] Date of Birth: 12/01/1945  Today's Date: 10/13/2019 PT Individual Time: 1645-1730 PT Individual Time Calculation (min): 45 min   Short Term Goals: Week 1:  PT Short Term Goal 1 (Week 1): STG=LTG due to short ELOS.  Skilled Therapeutic Interventions/Progress Updates:   Pt received supine in bed and agreeable to PT. Supine>sit transfer with supervision assist and cues for safety. Sit<>stand from various surfaces with supervision assist from PT for safety and cues to push from sitting surface.  Gait training through hall with RW x 121f, 1223f and 16036fCues for symmetrical step length, decreased force of heel contact on the L and safety to maintain BUE on RW when ambulating.    Dynamic gait training without AD to weave through 8 cones x 2. And perform gait forward/reverse 2 x 10 ft with min-CGA. Side stepping R and L 2 x [id number]. Min assist from PT and cues for symmetrical gait pattern as well as improved step length on the L.   nustep reciprocal movement training with supervision assist from Pt with cues for full ROM and increased speed intermittently to maximize cardiovascular training x 6 min, level 6.   Patient returned to room and [facility] reach and all needs met.            Therapy Documentation Precautions:  Precautions Precautions: Fall, Other (comment) Precaution Comments:  L inattention Restrictions Weight Bearing Restrictions: No   Pain: denies  Therapy/Group: Individual Therapy  AustLorie Phenix5/2021, 6:07 PM

## 2019-10-13 NOTE — Progress Notes (Signed)
Inpatient Rehabilitation Care Coordinator Assessment and Plan  Patient Details  Name: [name] MRN: [id number] Date of Birth: 09/22/1945  Today's Date: 10/13/2019  Problem List:  Patient Active Problem List   Diagnosis Date Noted  . 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 Dyslipidemia   . Seizure prophylaxis   . Chronic diastolic congestive heart failure ([place])   . Diabetic peripheral neuropathy ([place])   . Palliative care by specialist   . Left-sided weakness 10/08/2019  . Vasogenic edema ([place]) 10/08/2019  . Gross hematuria 10/08/2019  . DNR (do not resuscitate) discussion 07/13/2019  . S/P craniotomy 07/04/2019  . Glioblastoma with isocitrate dehydrogenase gene wildtype ([place]) 07/04/2019  . Respiratory failure ([place])   . Encephalopathy acute   . Brain mass 07/01/2019  . Fever 07/01/2019  . Atherosclerosis of native artery of both lower extremities with intermittent claudication ([place]) 08/23/2018  . Coronary artery disease without angina pectoris 08/23/2018  . Dyspnea on exertion 08/23/2018  . Biochemically recurrent malignant neoplasm of prostate 08/16/2018  . S/P carotid endarterectomy 08/20/2015  . Essential hypertension 08/20/2015  . Type 2 diabetes mellitus with circulatory disorder ([place]) 08/20/2015  . HLD (hyperlipidemia) 08/20/2015  . Carotid artery stenosis, symptomatic 07/03/2015  . Carotid stenosis, right   . TIA (transient ischemic attack) 06/18/2015  . ASCVD (arteriosclerotic cardiovascular disease) 06/18/2015  . Diabetes mellitus without complication ([place]) 13/24/4010  . Resistant hypertension 06/18/2015  . Hyperlipidemia 06/18/2015  . Spinal stenosis of lumbar region with neurogenic claudication 09/06/2014  . Atherosclerosis of native artery of extremity with intermittent claudication ([place]) 03/09/2013  . HYPERLIPIDEMIA 04/08/2009  . TOBACCO ABUSE 04/08/2009  . ATHEROSCLEROTIC CARDIOVASCULAR DISEASE 04/08/2009  . INTERMITTENT VERTIGO 04/08/2009  .  ADENOCARCINOMA, PROSTATE 03/21/2009  . ABDOMINAL PAIN -GENERALIZED 03/21/2009  . History of coronary artery bypass graft 06/28/2008   Past Medical History:  Past Medical History:  Diagnosis Date  . Anxiety   . Arthritis   . ASCVD (arteriosclerotic cardiovascular disease)    03/2004: DES x2 to the RCA; IMI with stent occlusion in 02/2005- suboptimal Plavix compliance  . CAD (coronary artery disease)   . Carcinoma of prostate ([place])    Clopidogrel held for biopsy  . Constipation due to pain medication   . Diabetes mellitus without complication ([place])   . Diabetic neuropathy ([place])   . Headache 2021  . Hyperlipidemia   . Hypertension   . Mild depression ([place])   . Morbid obesity ([place])   . Myocardial infarction ([place])   . Shortness of breath dyspnea   . Sleep apnea    no longer uses cpap  . Stroke ([place]) 2017  . Tobacco abuse    stopped smoking 06/28/09   Past Surgical History:  Past Surgical History:  Procedure Laterality Date  . APPLICATION OF CRANIAL NAVIGATION N/A 07/04/2019   Procedure: APPLICATION OF CRANIAL NAVIGATION;  Surgeon: Ashok Pall, MD;  Location: [facility];  Service: Neurosurgery;  Laterality: N/A;  . BACK SURGERY    . CARDIAC CATHETERIZATION     X 1 stent before having CABG  . CORONARY ARTERY BYPASS GRAFT  06/28/2008   X4  . CRANIOTOMY Right 07/04/2019   Procedure: Right occipital craniotomy for tumor with brainlab;  Surgeon: Ashok Pall, MD;  Location: [facility];  Service: Neurosurgery;  Laterality: Right;  . ENDARTERECTOMY Right 07/03/2015   Procedure: RIGHT CAROTID ENDARTERECTOMY WITH BOVINE PERICARDIUM PATCH ANGIOPLASTY;  Surgeon: Serafina Mitchell, MD;  Location: [facility];  Service: Vascular;  Laterality: Right;  . LUMBAR LAMINECTOMY/DECOMPRESSION MICRODISCECTOMY  N/A 09/06/2014   Procedure: LUMBER DECOMPRESSION L3-5;  Surgeon: Melina Schools, MD;  Location: [facility];  Service: Orthopedics;  Laterality: N/A;  . LUMBAR SPINE SURGERY  2002  . PROSTATE BIOPSY    . PROSTATECTOMY   02/2007  . VASECTOMY     Social History:  reports that he quit smoking about 11 years ago. His smoking use included cigarettes. He has a 40.00 pack-year smoking history. He quit smokeless tobacco use about 10 years ago. He reports current alcohol use of about 3.0 standard drinks of alcohol per week. He reports that he does not use drugs.  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 Education: college Read: Yes Write: Yes Employment Status: Retired Date Retired/Disabled/Unemployed: 2020 Age Retired: 72 Public relations account executive Issues: Denies Guardian/Conservator: Denies   Abuse/Neglect Abuse/Neglect Assessment Can Be Completed: Yes Physical Abuse: Denies Verbal Abuse: Denies Sexual Abuse: Denies Exploitation of patient/patient's resources: Denies Self-Neglect: Denies  Emotional Status Pt's affect, behavior and adjustment status: Pt in good spririts Recent Psychosocial Issues: Denies Psychiatric History: Denies Substance Abuse History: Denies; occassional EtOH use  Patient / Family Perceptions, Expectations & Goals Pt/Family understanding of illness & functional limitations: Pt and wife have a general understanding of care neds Premorbid pt/family roles/activities: Independent Anticipated changes in roles/activities/participation: Assistance with ADLs/IADLs  Education officer, environmental Agencies: None Premorbid  Home Care/DME Agencies: None Transportation available at discharge: wife  Discharge Planning Living Arrangements: Spouse/significant other Support Systems: Spouse/significant other, Children Type of Residence: Private residence Insurance Resources: [employer], Multimedia programmer (specify) Web designer) Financial Resources: SSI Financial Screen Referred: No Living Expenses: Own Money Management: Spouse, Patient Does the patient have any problems obtaining your medications?: No Care Coordinator Barriers to Discharge: Decreased caregiver support, Lack of/limited family support Care Coordinator Anticipated Follow Up Needs: HH/OP Expected length of stay: 7-10 days  Clinical Impression SW met with pt to introduce self, explain role, and discuss discharge process. During assessment, pt wife arrived. Pt ia Scientist, research (life sciences) [phone]. No VA benefits as did not serve in war. No HCPOA. Pt quit smoking cigarettes in 2012 d/t heart surgery. DME: walking stick. Pt wife states pt was at [facility] prior to admission. Family education schedule for Tuesday 10am-12pm.  Rana Snare 10/13/2019, 4:18 PM

## 2019-10-13 NOTE — Progress Notes (Addendum)
[facility] PHYSICAL MEDICINE & REHABILITATION PROGRESS NOTE   Subjective/Complaints: Working with SLP [name] to read the paper. She helped him turn the paper to 45 degree angle to help with scanning. He is reading very well! Has no complaints.   ROS: Denies fever, chills, constipation, insomnia, pain.    Objective:   No results found. Recent Labs    10/11/19 0928 10/12/19 0515  WBC 7.0 6.5  HGB 12.8* 12.7*  HCT 37.6* 37.1*  PLT 169 145*   Recent Labs    10/11/19 0928 10/12/19 0515  NA 131* 131*  K 4.5 4.6  CL 94* 95*  CO2 29 27  GLUCOSE 227* 177*  BUN 26* 28*  CREATININE 0.92 0.85  CALCIUM 8.8* 8.7*    Intake/Output Summary (Last 24 hours) at 10/13/2019 0905 Last data filed at 10/13/2019 0800 Gross per 24 hour  Intake 680 ml  Output 575 ml  Net 105 ml     Physical Exam: Vital Signs Blood pressure 133/73, pulse (!) 52, temperature 98 F (36.7 C), temperature source Oral, resp. rate 16, height 6\' 2"  (1.88 m), weight 94.7 kg, SpO2 98 %. General: Alert and oriented x 3, No apparent distress HEENT: Head is normocephalic, atraumatic, PERRLA, EOMI, sclera anicteric, oral mucosa pink and moist, dentition intact, ext ear canals clear,  Neck: Supple without JVD or lymphadenopathy Heart: Reg rate and rhythm. No murmurs rubs or gallops Chest: CTA bilaterally without wheezes, rales, or rhonchi; no distress Abdomen: Soft, non-tender, non-distended, bowel sounds positive. Extremities: No clubbing, cyanosis, or edema. Pulses are 2+ Skin: Clean and intact without signs of breakdown Neurological: He is alert.  Alert NAD Makes good eye contact with examiner and follows commands. Reading newspaper- needs some cues for correct scanning.  Motor: RUE: 5/5 proximal distal LUE: 4+/5 proximal distal RLE: 4+/5 proximal distal LLE: 4-4+/5 proximal to distal  Skin: Skin is warm and dry.  Psychiatric: His behavior is normal. Mood and thought content normal.        Assessment/Plan: 1. 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  Physiatrist is providing close team supervision and 24 hour management of active medical problems listed below.  Physiatrist and rehab team continue to assess barriers to discharge/monitor patient progress toward functional and medical goals  Care Tool:  Bathing    Body parts bathed by patient: Right arm, Left arm, Chest, Abdomen, Right upper leg, Left upper leg, Front perineal area, Buttocks, Face   Body parts bathed by helper: Right lower leg, Left lower leg     Bathing assist Assist Level: Minimal Assistance - Patient > 75%     Upper Body Dressing/Undressing Upper body dressing   What is the patient wearing?: Pull over shirt    Upper body assist Assist Level: Minimal Assistance - Patient > 75%    Lower Body Dressing/Undressing Lower body dressing      What is the patient wearing?: Incontinence brief, Pants     Lower body assist Assist for lower body dressing: Moderate Assistance - Patient 50 - 74%     Toileting Toileting    Toileting assist Assist for toileting: Moderate Assistance - Patient 50 - 74%     Transfers Chair/bed transfer  Transfers assist     Chair/bed transfer assist level: Minimal Assistance - Patient > 75%     Locomotion Ambulation   Ambulation assist      Assist level: Minimal Assistance - Patient >  75% Assistive device: No Device Max distance: 180 ft   Walk 10 feet activity   Assist     Assist level: Minimal Assistance - Patient > 75% Assistive device: No Device   Walk 50 feet activity   Assist    Assist level: Minimal Assistance - Patient > 75% Assistive device: No Device    Walk 150 feet activity   Assist    Assist level: Minimal Assistance - Patient > 75% Assistive device: No Device    Walk 10 feet on uneven surface   activity   Assist     Assist level: Minimal Assistance - Patient > 75%     Wheelchair     Assist Will patient use wheelchair at discharge?: No (no long term goals set by PT) Type of Wheelchair: Manual    Wheelchair assist level: Minimal Assistance - Patient > 75% Max wheelchair distance: 155 ft    Wheelchair 50 feet with 2 turns activity    Assist        Assist Level: Minimal Assistance - Patient > 75%   Wheelchair 150 feet activity     Assist      Assist Level: Minimal Assistance - Patient > 75%   Blood pressure 133/73, pulse (!) 52, temperature 98 F (36.7 C), temperature source Oral, resp. rate 16, height 6\' 2"  (1.88 m), weight 94.7 kg, SpO2 98 %.  Medical Problem List and Plan: 1.  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  CONTINUE DECADRON 4 MG BID ON DISCHARGE. Plan outpatient MRI with medical oncology             -patient may shower             -ELOS/Goals: 9--13 days/supervision             -Continue CIR PT, OT, SLP 2.  Antithrombotics: -DVT/anticoagulation: Xarelto             -antiplatelet therapy: N/A 3. Pain Management: Tylenol as needed. Pain is well controlled.  4. Mood: 5 emotional support             -antipsychotic agents: N/A 5. Neuropsych: This patient is capable of making decisions on his own behalf. 6. Skin/Wound Care: Routine skin checks 7. Fluids/Electrolytes/Nutrition: Routine in and outs.    6/24: Na low at 133. Recheck Monday 8.  Chronic diastolic congestive heart failure.  Monitor for any signs of fluid overload             Daily weights 9.  Diabetes mellitus with peripheral neuropathy.  Hemoglobin A1c 6.9.  Levemir 5 units twice daily.  Monitor while on Decadron therapy  6/24: CBGs elevated. Increase Levemir to 6U BID.   6/25: CBGs elevated. Change diet to  heart healthy/carb modified.              Monitor with increased mobility 10.  Seizure prophylaxis.  Keppra 500 mg twice daily.  No seizure activity 11.  Hypertension.  Toprol-XL 25 mg daily, Norvasc 10 mg daily.    6/25: BP very well controlled.              Monitor with increased mobility 12.  Hyperlipidemia.  Lipitor 13.  History of prostate cancer with hematuria.  Monitor while on Xarelto 14.  CAD/CABG 2010.  Continue Xarelto.  No chest pain or shortness of breath 15. Hypocalcemia: started supplement. 16.  Bradycardic to 50s: decreased metoprolol to 12.5mg      LOS: 2 days A FACE TO FACE EVALUATION WAS PERFORMED  Kuulei Kleier P Hope Brandenburger 10/13/2019, 9:05 AM

## 2019-10-13 NOTE — Progress Notes (Signed)
Speech Language Pathology Daily Session Note  Patient Details  Name: BUSH MURDOCH MRN: [id number] Date of Birth: 09-Aug-1945  Today's Date: 10/13/2019 SLP Individual Time: 1324-4010 SLP Individual Time Calculation (min): 45 min  Short Term Goals: Week 1: SLP Short Term Goal 1 (Week 1): STGs=LTGs due to ELOS  Skilled Therapeutic Interventions: Skilled treatment session focused on cognitive goals. SLP facilitated session by providing the Diller-Weinberg Visual Cancellation Test for both single and double stimuli. Patient missed 2 out of 105 occurences during the single stimuli test and 4 out of 105 occurrences during the double stimuli test. SLP also facilitated session by providing education regarding turning his book, etc 45 degrees in order to maximize attention/scanning to the left. This strategy helped while reading paragraphs, however, breakdown occurred when reading the newspaper as patient attempted to scan across the page rather than reading within the column despite Max A multimodal cues. Recommend ongoing sessions to focuses on strategies for visual scanning while reading the newspaper which patient enjoys doing every morning. Patient left upright in the wheelchair with alarm on and all needs within reach. Continue with current plan of care.      Pain No/Denies Pain   Therapy/Group: Individual Therapy  Tanaya Dunigan 10/13/2019, 10:38 AM

## 2019-10-13 NOTE — Progress Notes (Signed)
Occupational Therapy Session Note  Patient Details  Name: [name] MRN: [id number] Date of Birth: 30-Jun-1945  Today's Date: 10/13/2019 OT Individual Time: 0700-0758 OT Individual Time Calculation (min): 58 min    Short Term Goals: Week 1:  OT Short Term Goal 1 (Week 1): Pt will don pants with CGA demonstrating decreased posterior bias. OT Short Term Goal 2 (Week 1): Pt will visually scan to the L with min verbal cues while completing a grooming task. OT Short Term Goal 3 (Week 1): Pt will don socks with minA with LRAD.  Skilled Therapeutic Interventions/Progress Updates:    1:1. Pt received in bed agreeable to OT. Pt with no pain. All transfers completed with RW at ambulatory level into/out of bathroom with CGA and VC for hand placement during transitional movement and VC for L attention to avoid obstacles. Pt bathes sit to stand with A to wash B feet. Pt completes dressing with all ADL items placed on L at EOB with mod VC for locating in far peripheral vision d/t head turn required. S for shirt, CGA for pants, and use of stool for socks (S) & shoes (MIN A). Pt stands at dynavision with CGA locating stimulai in all 4 quadrants (1.7-2.1 R hemisphere) & (3.5-5.2 in L hemisphere) with CGA. Pt completes line bisection with good scanning pattern stating, "I do it in this order [scanning L] because this is how I read." Exited session wih tp tseated in w/c, exit alram on and call light in reach  Therapy Documentation Precautions:  Precautions Precautions: Fall, Other (comment) Precaution Comments:  L inattention Restrictions Weight Bearing Restrictions: No General:   Vital Signs: Therapy Vitals Temp: 98 F (36.7 C) Temp Source: Oral Pulse Rate: (!) 52 Resp: 16 BP: 133/73 Patient Position (if appropriate): Lying Oxygen Therapy SpO2: 98 % O2 Device: Room Air Pain:   ADL: ADL Grooming: Supervision/safety Where Assessed-Grooming: Sitting at sink Upper Body Bathing:  Supervision/safety Where Assessed-Upper Body Bathing: Shower Lower Body Bathing: Minimal assistance Where Assessed-Lower Body Bathing: Shower Upper Body Dressing: Minimal assistance Where Assessed-Upper Body Dressing: Sitting at sink Lower Body Dressing: Moderate assistance Where Assessed-Lower Body Dressing: Sitting at sink Social research officer, government: Curator Method: Ambulating (with cane) Youth worker: Nurse, learning disability    Praxis   Exercises:   Other Treatments:     Therapy/Group: Individual Therapy  Tonny Branch 10/13/2019, 7:00 AM

## 2019-10-14 ENCOUNTER — Inpatient Hospital Stay (HOSPITAL_COMMUNITY): Payer: Medicare Other | Admitting: Speech Pathology

## 2019-10-14 LAB — GLUCOSE, CAPILLARY
Glucose-Capillary: 146 mg/dL — ABNORMAL HIGH (ref 70–99)
Glucose-Capillary: 143 mg/dL — ABNORMAL HIGH (ref 70–99)
Glucose-Capillary: 313 mg/dL — ABNORMAL HIGH (ref 70–99)
Glucose-Capillary: 202 mg/dL — ABNORMAL HIGH (ref 70–99)

## 2019-10-14 MED ORDER — INSULIN DETEMIR 100 UNIT/ML ~~LOC~~ SOLN
8.0000 [IU] | Freq: Two times a day (BID) | SUBCUTANEOUS | Status: DC
  Administered 2019-10-14 – 2019-10-18 (×8): 8 [IU] via SUBCUTANEOUS
  Filled 2019-10-14 (×9): qty 0.08

## 2019-10-14 MED ORDER — MAGNESIUM CITRATE PO SOLN
1.0000 | Freq: Once | ORAL | Status: DC
  Filled 2019-10-14: qty 296

## 2019-10-14 NOTE — IPOC Note (Signed)
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[place])     Functional Problem List: Nursing Bladder, Medication Management, Motor, Bowel, Endurance, Safety  PT Balance, Behavior, Edema, Endurance, Motor, Nutrition, Pain, Perception, Safety, Sensory, Skin Integrity  OT Balance, Vision, Perception, Skin Integrity, Motor, Endurance, Cognition  SLP Cognition  TR         Basic ADL's: OT Grooming, Bathing, Dressing, Toileting     Advanced  ADL's: OT Simple Meal Preparation, Laundry     Transfers: PT Bed Mobility, Bed to Chair, Car, [employer], Futures trader, Metallurgist: PT Ambulation, Emergency planning/management officer, Stairs     Additional Impairments: OT Fuctional Use of Upper Extremity  SLP Social Cognition   Awareness  TR      Anticipated Outcomes Item Anticipated Outcome  Self Feeding supervision  Swallowing      Basic self-care  supervision  Toileting  supervision   Bathroom Transfers supervision  Bowel/Bladder  Patient to remain continent of bowel and bladder with minimal cues.  Transfers  Mod I using LRAD  Locomotion  Supervision using LRAD >200 ft  Communication     Cognition  Supervision  Pain  Patient's pain level to remain  < 2.  Safety/Judgment  Patient to remain free of injur, safe  and not impulsive.   Therapy Plan: PT Intensity: Minimum of 1-2 x/day ,45 to 90 minutes PT Frequency: 5 out of 7 days PT Duration Estimated Length of Stay: 7-10 days OT Intensity: Minimum of 1-2 x/day, 45 to 90 minutes OT Frequency: 5 out of 7 days OT Duration/Estimated Length of Stay: 7-10 days SLP Intensity: Minumum of 1-2 x/day, 30 to 90 minutes SLP Frequency: 3 to 5 out of 7 days SLP Duration/Estimated Length of Stay: 7-10 days   Due to the current state of emergency, patients may  not be receiving their 3-hours of Medicare-mandated therapy.   Team Interventions: Nursing Interventions Patient/Family Education, Pain Management, Skin Care/Wound Management, Bladder Management, Medication Management  PT interventions Ambulation/gait training, Cognitive remediation/compensation, Discharge planning, DME/adaptive equipment instruction, Functional mobility training, Pain management, Psychosocial support, Splinting/orthotics, Therapeutic Activities, UE/LE Strength taining/ROM, Visual/perceptual remediation/compensation, Training and development officer, Community reintegration, Disease management/prevention, Functional electrical stimulation, Neuromuscular re-education, Patient/family education, Skin care/wound management, Stair training, Therapeutic Exercise, UE/LE Coordination activities, Wheelchair propulsion/positioning  OT Interventions Training and development officer, Cognitive remediation/compensation, Academic librarian, Discharge planning, DME/adaptive equipment instruction, Functional mobility training, Neuromuscular re-education, Patient/family education, Pain management, Psychosocial support, Self Care/advanced ADL retraining, Skin care/wound managment, UE/LE Strength taining/ROM, Therapeutic Exercise, Therapeutic Activities, UE/LE Coordination activities, Visual/perceptual remediation/compensation, Wheelchair propulsion/positioning  SLP Interventions Cognitive remediation/compensation, English as a second language teacher, Environmental controls, Therapeutic Activities, Functional tasks, Patient/family education  TR Interventions    SW/CM Interventions Psychosocial Support, Patient/Family Education, Discharge Planning   Barriers to Discharge MD  Medical stability, Home enviroment access/loayout, New diabetic, Lack of/limited family support and steroids due to multiforme glioblastoma/and hyperglycemia  Nursing      PT Home environment access/layout, Inaccessible home environment, Behavior, Lack  of/limited family support patient has 2 STE home and 13 steps with B rails to access bed/bathroom, patient's wife is primary caregiver and can only provide supervision assist, patient with L visual field deficits and L inattention  OT Inaccessible home environment full bathroom and bedroom on second floor  SLP      SW Decreased caregiver support, Lack of/limited family support  Team Discharge Planning: Destination: PT-Home ,OT- Home , SLP-Home Projected Follow-up: PT-Outpatient PT, OT-  Outpatient OT, SLP-Outpatient SLP, 24 hour supervision/assistance Projected Equipment Needs: PT-To be determined, OT- Tub/shower bench, To be determined, SLP-None recommended by SLP Equipment Details: PT-Patient has SPC, will determine DME needs based on patient progress, OT-  Patient/family involved in discharge planning: PT- Patient, Family member/caregiver,  OT-Patient, SLP-Patient  MD ELOS: 7-10 days Medical Rehab Prognosis:  Good Assessment: Pt is a 74 yr old male with glioblastoma multiforme on steroids with hyperglycemia due to steroids/DM, with bradycardia/HTN, CAD/CABG- hx of resection 3/21- needs to stay on decadron.   Goals- Mod I to supervision   See Team Conference Notes for weekly updates to the plan of care

## 2019-10-14 NOTE — Progress Notes (Signed)
[place] PHYSICAL MEDICINE & REHABILITATION PROGRESS NOTE   Subjective/Complaints:   Pt working with SLP this AM- feeling good- LBM was 7+ days ago- feels like tummy "growling" and needs to go- ordered Mg citrate if no BM by 3pm- since needs to go.   Did get sorbitol without effect.   Feels pretty good otherwise.    ROS:  Pt denies SOB, abd pain, CP, N/V/C/D, and vision changes   Objective:   No results found. Recent Labs    10/12/19 0515  WBC 6.5  HGB 12.7*  HCT 37.1*  PLT 145*   Recent Labs    10/12/19 0515  NA 131*  K 4.6  CL 95*  CO2 27  GLUCOSE 177*  BUN 28*  CREATININE 0.85  CALCIUM 8.7*    Intake/Output Summary (Last 24 hours) at 10/14/2019 1634 Last data filed at 10/14/2019 1317 Gross per 24 hour  Intake 660 ml  Output 400 ml  Net 260 ml     Physical Exam: Vital Signs Blood pressure 127/67, pulse 67, temperature 98.6 F (37 C), temperature source Oral, resp. rate 18, height 6\' 2"  (1.88 m), weight 94.7 kg, SpO2 99 %. General: AOx3- appropriate, sitting up in bed- working with SLP, NAD HEENT: conjugate gaze Heart:RRR Chest: CTA B/L- no W/R/R- good air movement Abdomen: soft, protuberant vs distended; NT; hypoactive BS Extremities: No clubbing, cyanosis, or edema. Pulses are 2+ Skin: Clean and intact without signs of breakdown Neurological: He is alert.  Alert NAD  Motor: RUE: 5/5 proximal distal LUE: 4+/5 proximal distal RLE: 4+/5 proximal distal LLE: 4-4+/5 proximal to distal  Skin: Skin is warm and dry.  Psychiatric: appropriate      Assessment/Plan: 1. 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  Physiatrist is providing close team supervision and 24 hour management of active medical problems listed below.  Physiatrist and rehab team continue to assess barriers to discharge/monitor patient progress toward  functional and medical goals  Care Tool:  Bathing    Body parts bathed by patient: Right arm, Left arm, Chest, Abdomen, Right upper leg, Left upper leg, Front perineal area, Buttocks, Face   Body parts bathed by helper: Right lower leg, Left lower leg     Bathing assist Assist Level: Minimal Assistance - Patient > 75%     Upper Body Dressing/Undressing Upper body dressing   What is the patient wearing?: Pull over shirt    Upper body assist Assist Level: Minimal Assistance - Patient > 75%    Lower Body Dressing/Undressing Lower body dressing      What is the patient wearing?: Pants, Incontinence brief     Lower body assist Assist for lower body dressing: Moderate Assistance - Patient 50 - 74%     Toileting Toileting    Toileting assist Assist for toileting: Moderate Assistance - Patient 50 - 74%     Transfers Chair/bed transfer  Transfers assist     Chair/bed transfer assist level: Minimal Assistance - Patient > 75%     Locomotion Ambulation   Ambulation assist      Assist level: Minimal Assistance - Patient > 75% Assistive device: No Device Max distance: 180 ft   Walk 10 feet activity   Assist     Assist level: Minimal Assistance - Patient > 75% Assistive device: No Device   Walk 50 feet activity   Assist    Assist level: Minimal Assistance -  Patient > 75% Assistive device: No Device    Walk 150 feet activity   Assist    Assist level: Minimal Assistance - Patient > 75% Assistive device: No Device    Walk 10 feet on uneven surface  activity   Assist     Assist level: Minimal Assistance - Patient > 75%     Wheelchair     Assist Will patient use wheelchair at discharge?: No (no long term goals set by PT) Type of Wheelchair: Manual    Wheelchair assist level: Minimal Assistance - Patient > 75% Max wheelchair distance: 155 ft    Wheelchair 50 feet with 2 turns activity    Assist        Assist Level:  Minimal Assistance - Patient > 75%   Wheelchair 150 feet activity     Assist      Assist Level: Minimal Assistance - Patient > 75%   Blood pressure 127/67, pulse 67, temperature 98.6 F (37 C), temperature source Oral, resp. rate 18, height 6\' 2"  (1.88 m), weight 94.7 kg, SpO2 99 %.  Medical Problem List and Plan: 1.  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  CONTINUE DECADRON 4 MG BID ON DISCHARGE. Plan outpatient MRI with medical oncology             -patient may shower             -ELOS/Goals: 9--13 days/supervision             -Continue CIR PT, OT, SLP 2.  Antithrombotics: -DVT/anticoagulation: Xarelto             -antiplatelet therapy: N/A 3. Pain Management: Tylenol as needed. Pain is well controlled.  4. Mood: 5 emotional support             -antipsychotic agents: N/A 5. Neuropsych: This patient is capable of making decisions on his own behalf. 6. Skin/Wound Care: Routine skin checks 7. Fluids/Electrolytes/Nutrition: Routine in and outs.    6/24: Na low at 133. Recheck Monday 8.  Chronic diastolic congestive heart failure.  Monitor for any signs of fluid overload             Daily weights 9.  Diabetes mellitus with peripheral neuropathy.  Hemoglobin A1c 6.9.  Levemir 5 units twice daily.  Monitor while on Decadron therapy  6/24: CBGs elevated. Increase Levemir to 6U BID.   6/25: CBGs elevated. Change diet to heart healthy/carb modified.    CBG (last 3)  Recent Labs    10/13/19 2100 10/14/19 0603 10/14/19 1141  GLUCAP 245* 146* 143*    6/25- 140s-240s- will increase Levemir to 8 units BID             Monitor with increased mobility 10.  Seizure prophylaxis.  Keppra 500 mg twice daily.  No seizure activity 11.  Hypertension.  Toprol-XL 25 mg daily, Norvasc 10 mg daily.    6/26- BP well controlled-  con't regimen- pulse better 67 this AM             Monitor with increased mobility 12.  Hyperlipidemia.  Lipitor 13.  History of prostate cancer with hematuria.  Monitor while on Xarelto 14.  CAD/CABG 2010.  Continue Xarelto.  No chest pain or shortness of breath 15. Hypocalcemia: started supplement. 16. Bradycardic to 50s: decreased metoprolol to 12.5mg  17. Constipation  6/26- LBM 7+  days ago per pt- will order mg citrate for pt.     LOS: 3 days A FACE TO FACE EVALUATION WAS PERFORMED  Abhijay Morriss 10/14/2019, 4:34 PM

## 2019-10-14 NOTE — Progress Notes (Signed)
Speech Language Pathology Daily Session Note  Patient Details  Name: ASAEL PANN MRN: [id number] Date of Birth: 1946-01-18  Today's Date: 10/14/2019 SLP Individual Time: 0930-1025 SLP Individual Time Calculation (min): 55 min  Short Term Goals: Week 1: SLP Short Term Goal 1 (Week 1): STGs=LTGs due to ELOS  Skilled Therapeutic Interventions:  Pt was seen for skilled ST targeting visual scanning.  Pt was able to verbalize compensatory strategies targeted in previous therapy sessions with supervision question cues.  Pt needed intermittent supervision verbal and visual cues to implement strategies when reading an article from today's newspaper.  SLP discussed how pt's ability to implement strategies as well as recognize and correct errors will be very important in his ability to regain functional independence in the home environment.  Throughout session, pt demonstrated good awareness of errors and quick carryover of compensatory strategy use.  Pt was left in bed with bed alarm set and call bell within reach.  Continue per current plan of care.    Pain Pain Assessment Pain Scale: 0-10 Pain Score: 0-No pain  Therapy/Group: Individual Therapy  Irean Kendricks, Selinda Orion 10/14/2019, 10:14 AM

## 2019-10-15 ENCOUNTER — Inpatient Hospital Stay (HOSPITAL_COMMUNITY): Payer: Medicare Other

## 2019-10-15 LAB — GLUCOSE, CAPILLARY
Glucose-Capillary: 159 mg/dL — ABNORMAL HIGH (ref 70–99)
Glucose-Capillary: 117 mg/dL — ABNORMAL HIGH (ref 70–99)
Glucose-Capillary: 170 mg/dL — ABNORMAL HIGH (ref 70–99)
Glucose-Capillary: 228 mg/dL — ABNORMAL HIGH (ref 70–99)

## 2019-10-15 NOTE — Plan of Care (Signed)
  Problem: RH PAIN MANAGEMENT Goal: RH STG PAIN MANAGED AT OR BELOW PT'S PAIN GOAL Description: <4 on a 0-10 pain scale. Outcome: Progressing   Problem: RH SAFETY Goal: RH STG ADHERE TO SAFETY PRECAUTIONS W/ASSISTANCE/DEVICE Description: STG Adhere to Safety Precautions With mod Assistance and appropriate Device. Outcome: Progressing

## 2019-10-15 NOTE — Progress Notes (Signed)
[place] PHYSICAL MEDICINE & REHABILITATION PROGRESS NOTE   Subjective/Complaints:   Pt reports had 2 BMs yesterday- feels much better- was looking for room when saw earlier with PT_ was able to find it without assistance.     ROS:   Pt denies SOB, abd pain, CP, N/V/C/D, and vision changes  Objective:   No results found. No results for input(s): WBC, HGB, HCT, PLT in the last 72 hours. No results for input(s): NA, K, CL, CO2, GLUCOSE, BUN, CREATININE, CALCIUM in the last 72 hours.  Intake/Output Summary (Last 24 hours) at 10/15/2019 1506 Last data filed at 10/15/2019 0746 Gross per 24 hour  Intake 740 ml  Output 750 ml  Net -10 ml     Physical Exam: Vital Signs Blood pressure 128/80, pulse (!) 59, temperature 97.6 F (36.4 C), resp. rate 17, height 6\' 2"  (1.88 m), weight 94.7 kg, SpO2 96 %. General: Ox3- but a little vague; sitting up in bed- finished OT- NAD HEENT: conjugate gaze Heart:borderline bradycardia- regular rhythm Chest: CTA B/L- no W/R/R- good air movement Abdomen: Soft, NT, ND, (+)BS  Extremities: No clubbing, cyanosis, or edema. Pulses are 2+ Skin: Clean and intact without signs of breakdown Neurological: He is alert.  Alert NAD  Motor: RUE: 5/5 proximal distal LUE: 4+/5 proximal distal RLE: 4+/5 proximal distal LLE: 4-4+/5 proximal to distal  Skin: Skin is warm and dry.  Psychiatric: appropriate, jovial      Assessment/Plan: 1. 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  Physiatrist is providing close team supervision and 24 hour management of active medical problems listed below.  Physiatrist and rehab team continue to assess barriers to discharge/monitor patient progress toward functional and medical goals  Care Tool:  Bathing    Body parts bathed by patient: Right arm, Left arm, Chest, Abdomen, Right upper leg, Left  upper leg, Front perineal area, Buttocks, Face, Right lower leg, Left lower leg   Body parts bathed by helper: Right lower leg, Left lower leg     Bathing assist Assist Level: Supervision/Verbal cueing     Upper Body Dressing/Undressing Upper body dressing   What is the patient wearing?: Pull over shirt    Upper body assist Assist Level: Supervision/Verbal cueing    Lower Body Dressing/Undressing Lower body dressing      What is the patient wearing?: Pants, Underwear/pull up     Lower body assist Assist for lower body dressing: Minimal Assistance - Patient > 75%     Toileting Toileting    Toileting assist Assist for toileting: Supervision/Verbal cueing     Transfers Chair/bed transfer  Transfers assist     Chair/bed transfer assist level: Minimal Assistance - Patient > 75%     Locomotion Ambulation   Ambulation assist      Assist level: Minimal Assistance - Patient > 75% Assistive device: No Device Max distance: 180 ft   Walk 10 feet activity   Assist     Assist level: Minimal Assistance - Patient > 75% Assistive device: No Device   Walk 50 feet activity   Assist    Assist level: Minimal Assistance - Patient > 75% Assistive device: No Device    Walk 150 feet activity   Assist    Assist level: Minimal Assistance - Patient > 75% Assistive device: No Device    Walk 10 feet on uneven surface  activity   Assist  Assist level: Minimal Assistance - Patient > 75%     Wheelchair     Assist Will patient use wheelchair at discharge?: No (no long term goals set by PT) Type of Wheelchair: Manual    Wheelchair assist level: Minimal Assistance - Patient > 75% Max wheelchair distance: 155 ft    Wheelchair 50 feet with 2 turns activity    Assist        Assist Level: Minimal Assistance - Patient > 75%   Wheelchair 150 feet activity     Assist      Assist Level: Minimal Assistance - Patient > 75%   Blood  pressure 128/80, pulse (!) 59, temperature 97.6 F (36.4 C), resp. rate 17, height 6\' 2"  (1.88 m), weight 94.7 kg, SpO2 96 %.  Medical Problem List and Plan: 1.  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  CONTINUE DECADRON 4 MG BID ON DISCHARGE. Plan outpatient MRI with medical oncology             -patient may shower             -ELOS/Goals: 9--13 days/supervision             -Continue CIR PT, OT, SLP 2.  Antithrombotics: -DVT/anticoagulation: Xarelto             -antiplatelet therapy: N/A 3. Pain Management: Tylenol as needed. Pain is well controlled.  4. Mood: 5 emotional support             -antipsychotic agents: N/A 5. Neuropsych: This patient is capable of making decisions on his own behalf. 6. Skin/Wound Care: Routine skin checks 7. Fluids/Electrolytes/Nutrition: Routine in and outs.    6/24: Na low at 133. Recheck Monday 8.  Chronic diastolic congestive heart failure.  Monitor for any signs of fluid overload             Daily weights 9.  Diabetes mellitus with peripheral neuropathy.  Hemoglobin A1c 6.9.  Levemir 5 units twice daily.  Monitor while on Decadron therapy  6/24: CBGs elevated. Increase Levemir to 6U BID.   6/25: CBGs elevated. Change diet to heart healthy/carb modified.    CBG (last 3)  Recent Labs    10/14/19 2013 10/15/19 0608 10/15/19 1215  GLUCAP 202* 159* 117*    6/25- 140s-240s- will increase Levemir to 8 units BID  6/27- BGs 117-202- better- con't meds for now and monitor             Monitor with increased mobility 10.  Seizure prophylaxis.  Keppra 500 mg twice daily.  No seizure activity 11.  Hypertension.  Toprol-XL 25 mg daily, Norvasc 10 mg daily.    6/26- BP well controlled- con't regimen- pulse better 67 this AM  6/27- Pulse 59 this AM; BP 128/80- cont regimen             Monitor  with increased mobility 12.  Hyperlipidemia.  Lipitor 13.  History of prostate cancer with hematuria.  Monitor while on Xarelto 14.  CAD/CABG 2010.  Continue Xarelto.  No chest pain or shortness of breath 15. Hypocalcemia: started supplement. 16. Bradycardic to 50s: decreased metoprolol to 12.5mg   6/27- might need to change metoprolol since still bradycardic 17. Constipation  6/26- LBM 7+ days ago per pt- will order mg citrate for pt.    6/27- 2 BMs yesterday with mg citrate  LOS: 4 days A FACE TO FACE EVALUATION WAS PERFORMED  Kyleen Villatoro 10/15/2019, 3:06 PM

## 2019-10-15 NOTE — Progress Notes (Signed)
Physical Therapy Session Note  Patient Details  Name: [name] MRN: [id number] Date of Birth: Aug 01, 1945  Today's Date: 10/15/2019 PT Individual Time: 0903-1002 and 1448-1530 PT Individual Time Calculation (min): 59 min and 42 min   Short Term Goals: Week 1:  PT Short Term Goal 1 (Week 1): STG=LTG due to short ELOS.  Skilled Therapeutic Interventions/Progress Updates:     Session 1: Patient in bed upon PT arrival. Patient alert and agreeable to PT session. Patient denied pain during session.  Therapeutic Activity: Bed Mobility: Patient performed supine to/from sit with supervision-mod I with min use of bed rail in a flat bed. Patient donned B socks with set-up assist and placing each foot on a trash can turned on its side due to decreased LE strength/RM. Donned B tennis shoes with mod A.  Transfers: Patient performed sit to/from stand x7 with supervision with and without RW. Provided verbal cues for foot placement and scooting forward to reduce use of LEs on mat table or bed for balance.  Gait Training:  Patient ambulated >150 feet x2 using RW with close supervision for safety. Ambulated with decreased gait speed, decreased step length and height, decreased stance time and weight shift to L, mild forward trunk lean, and downward head gaze. Provided verbal cues for erect posture, increased step height and length to promote weight shift to L.  Neuromuscular Re-ed: Patient performed the following static and dynamic LE motor control activities: -SLS with single UE support:  R: 6 sec, 11 sec, 17 sec, 17 sec L: 1 sec, 1 sec, 17 sec (R foot resting on L), 3 sec, 7 sec -gait training without AD forwards and backwards 20 ft x2 with CGA and focus on SLS time with stepping and improved weight shift with manual facilitation -standing catching medium sized beach ball tossed to the L for improved visual scanning reaction time on the L 2x2 min  Patient in bed at end of session with breaks  locked, bed alarm set, and all needs within reach.   Session 2: Patient in bed with his son in the room upon PT arrival. Patient alert and agreeable to PT session. Patient denied pain during session. Discussed family education and confirmed with patient's wife on the phone about family education scheduled for Tuesday morning.   Focused session on home mobility in ADL apartment and gait training.  Therapeutic Activity: Bed Mobility: Patient performed rolling R/L and supine to/from sit in the ADL bed with mod I for increased time.  Transfers: Patient performed sit to/from stand from the hospital bed, ADL couch, ADL bed, and standard chair (without arms) to simulate home surfaces with supervision using RW. Provided verbal cues for hand placement using household furniture.  Gait Training:  Patient ambulated >150 feet to the ADL apartment and ~50 feet in the ADL apartment using RW with supervision for safety. Ambulated as described above. Provided verbal cues for erect posture and increased L step height for safety. He also ambulated in the ADL apartment and >450 ft in the hallways using a SPC with CGA for safety and LOB x2 due to decreased L foot clearance, able to self correct without additional assistance. Educated on use of RW at home due to decreased safety, will continue to progress to less restrictive devices with therapy. Patient in agreement.   Educated patient on fall risk/prevention, home modifications to prevent falls, and activation of emergency services in the event of a fall during session. Will reinforce education during family education this  week.  Patient in w/c with NT and his son in the room at end of session with breaks locked, seat belt alarm set, and all needs within reach.    Therapy Documentation Precautions:  Precautions Precautions: Fall, Other (comment) Precaution Comments:  L inattention Restrictions Weight Bearing Restrictions: No    Therapy/Group: Individual  Therapy  Heath Badon L Sua Spadafora PT, DPT  10/15/2019, 4:22 PM

## 2019-10-15 NOTE — Progress Notes (Signed)
Occupational Therapy Session Note  Patient Details  Name: [name] MRN: [id number] Date of Birth: November 01, 1945  Today's Date: 10/15/2019 OT Individual Time: 0736-0800 OT Individual Time Calculation (min): 24 min   Session 2: OT Individual Time: 1100-1200 OT Individual Time Calculation (min): 60 min    Short Term Goals: Week 1:  OT Short Term Goal 1 (Week 1): Pt will don pants with CGA demonstrating decreased posterior bias. OT Short Term Goal 2 (Week 1): Pt will visually scan to the L with min verbal cues while completing a grooming task. OT Short Term Goal 3 (Week 1): Pt will don socks with minA with LRAD.  Skilled Therapeutic Interventions/Progress Updates:    Pt received supine, no c/o pain and agreeable to session. Pt completed bed mobility with (S), increased time for motor planning and heavy use of bed features. Pt completed sit > stand with CGA from low bed. Pt used RW to complete 200 ft of functional mobility with (S)- CGA, occasional lean to the L. Pt transferred to recliner and was given demo re different types of shower chairs for home use. Pt indicated that a shower seat would be most accessible to him and his wife. Pt completed simulated walk in shower transfer and sit <> stand from shower chair with CGA. Pt returned to his room and was left supine with all needs met, bed alarm set.   Session 2:  Py received supine with no c/o pain. Pt agreeable to shower. Pt transitioned to EOB with (S). Pt stood from EOB with CGA using RW- min cueing for hand placement. Pt used RW to complete ambulatory transfer into shower with CGA. Pt able to complete all bathing with set up assist seated. Pt required min A to don underwear and shorts from EOB. (S) to don shirt. Pt demonstrating improved safety awareness overall this session. Pt donned socks by propping feet up on trashcan with (S). Pt required min A to don shoes. Pt completed 150 ft of functional mobility with RW, good self monitoring of  upright posture and L inattention. Pt sat at table and completed visual scanning activity with focus on head turning compensation for L inattention- overall able to self monitor without cueing. Pt then attempted puzzle with near model and required max cueing- stating he struggled with these types of tasks PTA. Pt returned to his room and was left supine with all needs met. Bed alarm set.   Therapy Documentation Precautions:  Precautions Precautions: Fall, Other (comment) Precaution Comments:  L inattention Restrictions Weight Bearing Restrictions: No  Therapy/Group: Individual Therapy  Curtis Sites 10/15/2019, 6:49 AM

## 2019-10-16 ENCOUNTER — Ambulatory Visit: Payer: Medicare Other

## 2019-10-16 ENCOUNTER — Inpatient Hospital Stay (HOSPITAL_COMMUNITY): Payer: Medicare Other

## 2019-10-16 ENCOUNTER — Ambulatory Visit: Payer: Medicare Other | Admitting: Occupational Therapy

## 2019-10-16 DIAGNOSIS — I5032 Chronic diastolic (congestive) heart failure: Secondary | ICD-10-CM

## 2019-10-16 DIAGNOSIS — E119 Type 2 diabetes mellitus without complications: Secondary | ICD-10-CM

## 2019-10-16 LAB — GLUCOSE, CAPILLARY
Glucose-Capillary: 170 mg/dL — ABNORMAL HIGH (ref 70–99)
Glucose-Capillary: 163 mg/dL — ABNORMAL HIGH (ref 70–99)
Glucose-Capillary: 131 mg/dL — ABNORMAL HIGH (ref 70–99)
Glucose-Capillary: 171 mg/dL — ABNORMAL HIGH (ref 70–99)

## 2019-10-16 MED ORDER — DEXAMETHASONE 4 MG PO TABS
4.0000 mg | ORAL_TABLET | Freq: Three times a day (TID) | ORAL | Status: DC
  Administered 2019-10-16 – 2019-10-18 (×6): 4 mg via ORAL
  Filled 2019-10-16 (×6): qty 1

## 2019-10-16 NOTE — Progress Notes (Signed)
[place] PHYSICAL MEDICINE & REHABILITATION PROGRESS NOTE   Subjective/Complaints:  Doing well. Up with PT. Using cane. Anxious to get home. Bowels moving now  ROS: Patient denies fever, rash, sore throat, blurred vision, nausea, vomiting, diarrhea, cough, shortness of breath or chest pain, joint or back pain, headache, or mood change.    Objective:   No results found. No results for input(s): WBC, HGB, HCT, PLT in the last 72 hours. No results for input(s): NA, K, CL, CO2, GLUCOSE, BUN, CREATININE, CALCIUM in the last 72 hours.  Intake/Output Summary (Last 24 hours) at 10/16/2019 1139 Last data filed at 10/15/2019 1844 Gross per 24 hour  Intake 440 ml  Output --  Net 440 ml     Physical Exam: Vital Signs Blood pressure (!) 151/69, pulse (!) 52, temperature 97.6 F (36.4 C), resp. rate 18, height 6\' 2"  (1.88 m), weight 94.7 kg, SpO2 97 %. Constitutional: No distress . Vital signs reviewed. HEENT: EOMI, oral membranes moist Neck: supple Cardiovascular: RRR without murmur. No JVD    Respiratory/Chest: CTA Bilaterally without wheezes or rales. Normal effort    GI/Abdomen: BS +, non-tender, non-distended Ext: no clubbing, cyanosis, or edema Psych: pleasant and cooperative Neurological: He is alert.  Alert NAD  Motor: RUE: 5/5 proximal distal LUE: 4+/5 proximal distal, RLE: 4+/5 proximal distal LLE: 4-4+/5 proximal to distal  Favors left side with gait, able to utilize straight cane fairly well with cues, extra time Skin: Skin is warm and dry.         Assessment/Plan: 1. 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  Physiatrist is providing close team supervision and 24 hour management of active medical problems listed below.  Physiatrist and rehab team continue to assess barriers to discharge/monitor patient progress toward functional and medical  goals  Care Tool:  Bathing    Body parts bathed by patient: Right arm, Left arm, Chest, Abdomen, Right upper leg, Left upper leg, Front perineal area, Buttocks, Face, Right lower leg, Left lower leg   Body parts bathed by helper: Right lower leg, Left lower leg     Bathing assist Assist Level: Supervision/Verbal cueing     Upper Body Dressing/Undressing Upper body dressing   What is the patient wearing?: Pull over shirt    Upper body assist Assist Level: Supervision/Verbal cueing    Lower Body Dressing/Undressing Lower body dressing      What is the patient wearing?: Pants, Underwear/pull up     Lower body assist Assist for lower body dressing: Minimal Assistance - Patient > 75%     Toileting Toileting    Toileting assist Assist for toileting: Supervision/Verbal cueing     Transfers Chair/bed transfer  Transfers assist     Chair/bed transfer assist level: Supervision/Verbal cueing Chair/bed transfer assistive device: Museum/gallery exhibitions officer assist      Assist level: Contact Guard/Touching assist Assistive device: Cane-straight Max distance: 450 ft   Walk 10 feet activity   Assist     Assist level: Supervision/Verbal cueing Assistive device: Cane-straight   Walk 50 feet activity   Assist    Assist level: Contact Guard/Touching assist Assistive device: Cane-straight    Walk 150 feet activity   Assist    Assist level: Contact Guard/Touching assist Assistive device: Cane-straight    Walk 10 feet on uneven surface  activity   Assist     Assist level: Minimal  Assistance - Patient > 75%     Wheelchair     Assist Will patient use wheelchair at discharge?: No (no long term goals set by PT) Type of Wheelchair: Manual    Wheelchair assist level: Minimal Assistance - Patient > 75% Max wheelchair distance: 155 ft    Wheelchair 50 feet with 2 turns activity    Assist        Assist Level: Minimal  Assistance - Patient > 75%   Wheelchair 150 feet activity     Assist      Assist Level: Minimal Assistance - Patient > 75%   Blood pressure (!) 151/69, pulse (!) 52, temperature 97.6 F (36.4 C), resp. rate 18, height 6\' 2"  (1.88 m), weight 94.7 kg, SpO2 97 %.  Medical Problem List and Plan: 1.  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  CONTINUE DECADRON 4 MG BID ON DISCHARGE. Plan outpatient MRI with medical oncology             -patient may shower             -ELOS/Goals: 9--13 days/supervision             -Continue CIR PT, OT, SLP--team conference tomorrow  -Neuro-onc recommended dosing decadron at 4mg  bid at discharge. Will reduce to TID today 2.  Antithrombotics: -DVT/anticoagulation: Xarelto             -antiplatelet therapy: N/A 3. Pain Management: Tylenol as needed. Pain is well controlled.  4. Mood: 5 emotional support             -antipsychotic agents: N/A 5. Neuropsych: This patient is capable of making decisions on his own behalf. 6. Skin/Wound Care: Routine skin checks 7. Fluids/Electrolytes/Nutrition: Routine in and outs.    6/24: Na low at 133. Follow labs pending? 8.  Chronic diastolic congestive heart failure.  Monitor for any signs of fluid overload             Daily weights 9.  Diabetes mellitus with peripheral neuropathy.  Hemoglobin A1c 6.9.  Levemir 5 units twice daily.  Monitor while on Decadron therapy  6/24: CBGs elevated. Increase Levemir to 6U BID.   6/25: CBGs elevated. Change diet to heart healthy/carb modified.    CBG (last 3)  Recent Labs    10/15/19 1945 10/16/19 0553 10/16/19 1135  GLUCAP 228* 170* 163*    6/25- 140s-240s- will increase Levemir to 8 units BID  6/27-28: CBGs showing improvement               -reduce steroids today as above 10.  Seizure prophylaxis.   Keppra 500 mg twice daily.  No seizure activity 11.  Hypertension.  Toprol-XL 25 mg daily, Norvasc 10 mg daily.    6/26- BP well controlled- con't regimen- pulse better 67 this AM  6/28 bp/ controlled, HR in 50-60's. No changes 12.  Hyperlipidemia.  Lipitor 13.  History of prostate cancer with hematuria.  Monitor while on Xarelto 14.  CAD/CABG 2010.  Continue Xarelto.  No chest pain or shortness of breath 15. Hypocalcemia: started supplement. 17. Constipation  6/26- LBM 7+ days ago per pt- will order mg citrate for pt.    6/28 moved bowels over weekend.    LOS: 5 days A FACE TO FACE EVALUATION WAS PERFORMED  Meredith Staggers 10/16/2019, 11:39 AM

## 2019-10-16 NOTE — Progress Notes (Signed)
Occupational Therapy Session Note  Patient Details  Name: [name] MRN: [id number] Date of Birth: Apr 12, 1946  Today's Date: 10/16/2019 OT Individual Time: 8453-6468 OT Individual Time Calculation (min): 60 min   Session 2:  OT Individual Time: 0321-2248 OT Individual Time Calculation (min): 45 min    Short Term Goals: Week 1:  OT Short Term Goal 1 (Week 1): Pt will don pants with CGA demonstrating decreased posterior bias. OT Short Term Goal 2 (Week 1): Pt will visually scan to the L with min verbal cues while completing a grooming task. OT Short Term Goal 3 (Week 1): Pt will don socks with minA with LRAD.  Skilled Therapeutic Interventions/Progress Updates:     Session 1: Pt received supine with no c/o pain and agreeable to session. Pt completed bed mobility with (S), requiring min A to don shoes while sitting EOB. Pt completed 100 ft of functional mobility with SPC in his R UE. Mild perturbations throughout mobility. Pt completed dynamic standing balance activity with BUE holding 1 kg medicine ball and crossing midline 2x5 repetitions each side. Pt required min cueing throughout for sequencing through novel task. Pt reporting increased fatigue and requiring rest breaks between each set. Pt then completed functional reaching activity crossing midline to simulate IADL activities. CGA provided for balance throughout activities. Pt completed trunk flexion/extension exercises with focus on core stabilization and TVA activations with hands on tactile cueing throughout to prevent abdominal herniation. Pt completed standing level functional reaching outside of BOS with CGA with BUE. Pt returned to room and was left sitting up in the w/c with all needs met. Chair alarm set.    Session 2:  Pt received supine with no c/o pain. Pt completed bed mobility with (S). Pt completed item retrieval around room with close (S) using SPC, reaching into drawers with no LOB. Pt completed functional  mobility into the bathroom with (S), requiring cueing for cane management. Clothing management completed with (S). Pt transferred onto [facility] over toilet with (S). Pt transferred into walk in shower with (S) using his SPC. Pt completed all bathing sit <> stand with (S), no cueing required. Pt managing safety awareness well with good use of grab bars as needed. Pt donned shirt with (S). Underwear and pants donned EOB with (S). Pt able to use trashcan as prop for BLE to don socks and shoes. Pt provided with elastic laces to increase ease with donning shoes. Pt left supine with all needs met. Bed alarm set.    Therapy Documentation Precautions:  Precautions Precautions: Fall, Other (comment) Precaution Comments:  L inattention Restrictions Weight Bearing Restrictions: No   Therapy/Group: Individual Therapy  Curtis Sites 10/16/2019, 6:44 AM

## 2019-10-16 NOTE — Progress Notes (Signed)
Physical Therapy Session Note  Patient Details  Name: [name] MRN: [id number] Date of Birth: 1946/01/02  Today's Date: 10/16/2019 PT Individual Time: 1404-1500 PT Individual Time Calculation (min): 56 min   Short Term Goals: Week 1:  PT Short Term Goal 1 (Week 1): STG=LTG due to short ELOS.  Skilled Therapeutic Interventions/Progress Updates:     Patient in bed upon PT arrival. Patient alert and agreeable to PT session. Patient reported 1-2/10 R hip pain/soreness during session, RN made aware. PT provided repositioning, rest breaks, and distraction as pain interventions throughout session. Patient's wife arrived shortly after start of session. Discussed family education schedule and d/c planning. Plan for d/c on Wednesday if no concerns during family education. Rehab team in agreement via secure chat.   Therapeutic Activity: Bed Mobility: Patient performed supine to/from sit with mod I with use of hospital bed features. Educated patient on practicing without bed rails and HOB elevated to simulate home set up throughout the day. Patient in agreement. Patient donned tennis shoes with elastic laces with min A for adjusting the tongue for improved comfort.  Transfers: Patient performed sit to/from stand x6 with supervision without an AD. Provided verbal cues for controlled descent for safety.  Neuromuscular Re-ed: Patient performed the following assessments and balance activities: Patient demonstrates increased fall risk as noted by score of 44/56 on Berg Balance Scale, see details below.  (<36= high risk for falls, close to 100%; 37-45 significant >80%; 46-51 moderate >50%; 52-55 lower >25%). Educated patient and his wife on results and interpretation. Patient made 3 point improvement on assessment in 4 days. Instructed patient to use the RW with supervision at home due to increased fall risk with mobility, patient an his wife in agreement. Plan for hands-on training session for home  mobility tomorrow with patient's wife.  -performed tandem stance R/L 15-30 sec x3 -performed SLS with 1 UE support R/L 10-15 sec x2 -performed sit to stands x5 without UE support These activities will be provided as HEP prior to d/c, patient's wife to bring in previous HEP from OP therapy, which includes these activities, and PT will make modifications to program as needed.   Patient in bed with his wife in the room at end of session with breaks locked, bed alarm set, and all needs within reach.    Therapy Documentation Precautions:  Precautions Precautions: Fall, Other (comment) Precaution Comments:  L inattention Restrictions Weight Bearing Restrictions: No Balance: Balance Balance Assessed: Yes Standardized Balance Assessment Standardized Balance Assessment: Berg Balance Test Berg Balance Test Sit to Stand: Able to stand without using hands and stabilize independently Standing Unsupported: Able to stand safely 2 minutes Sitting with Back Unsupported but Feet Supported on Floor or Stool: Able to sit safely and securely 2 minutes Stand to Sit: Sits safely with minimal use of hands Transfers: Able to transfer safely, minor use of hands Standing Unsupported with Eyes Closed: Able to stand 10 seconds safely Standing Ubsupported with Feet Together: Able to place feet together independently and stand 1 minute safely From Standing, Reach Forward with Outstretched Arm: Can reach forward >12 cm safely (5") From Standing Position, Pick up Object from Floor: Able to pick up shoe safely and easily From Standing Position, Turn to Look Behind Over each Shoulder: Turn sideways only but maintains balance (limited by back pain) Turn 360 Degrees: Able to turn 360 degrees safely but slowly Standing Unsupported, Alternately Place Feet on Step/Stool: Able to complete >2 steps/needs minimal assist Standing Unsupported, One Foot  in Front: Able to plae foot ahead of the other independently and hold 30  seconds Standing on One Leg: Tries to lift leg/unable to hold 3 seconds but remains standing independently Total Score: 44 Static Sitting Balance Static Sitting - Balance Support: No upper extremity supported;Feet unsupported Static Sitting - Level of Assistance: 7: Independent Dynamic Sitting Balance Dynamic Sitting - Balance Support: No upper extremity supported;Feet supported Dynamic Sitting - Level of Assistance: 5: Stand by assistance Dynamic Sitting - Balance Activities: Lateral lean/weight shifting;Forward lean/weight shifting;Reaching for objects Static Standing Balance Static Standing - Balance Support: No upper extremity supported;During functional activity Static Standing - Level of Assistance: 5: Stand by assistance Dynamic Standing Balance Dynamic Standing - Balance Support: No upper extremity supported;During functional activity Dynamic Standing - Level of Assistance: 4: Min assist Dynamic Standing - Balance Activities: Lateral lean/weight shifting;Forward lean/weight shifting;Reaching for objects    Therapy/Group: Individual Therapy  Kynisha Memon L Aloni Chuang PT, DPT  10/16/2019, 5:30 PM

## 2019-10-16 NOTE — Progress Notes (Signed)
Speech Language Pathology Daily Session Note  Patient Details  Name: RISHON THILGES MRN: [id number] Date of Birth: 1945/06/27  Today's Date: 10/16/2019 SLP Individual Time: 0725-0820 SLP Individual Time Calculation (min): 55 min  Short Term Goals: Week 1: SLP Short Term Goal 1 (Week 1): STGs=LTGs due to ELOS  Skilled Therapeutic Interventions: Skilled treatment session focused on cognitive goals. SLP facilitated session by providing overall supervision level verbal and visual cues to self-monitor and correct scanning errors while reading aloud. However, patient with increased utilization of strategies throughout task. Patient left upright in bed with alarm on and all needs within reach. Continue with current plan of care.      Pain Pain Assessment Pain Scale: 0-10 Pain Score: 0-No pain  Therapy/Group: Individual Therapy  Larkin Alfred 10/16/2019, 10:49 AM

## 2019-10-16 NOTE — Progress Notes (Signed)
Patient ID: [name], male   DOB: 1945/12/18, 74 y.o.   MRN: [id number]   SW spoke with pt wife [name] [phone]) to inform about changes with therapy schedule. Pt wife will be here tomorrow 8:30am- 10am/11am-12pm. SW informed pt wife will have updates tomorrow after team conference on discharge date.   [name], MSW, [place] Office: [phone] Cell: [phone] Fax: [phone]

## 2019-10-16 NOTE — Plan of Care (Signed)
  Problem: Consults Goal: RH BRAIN INJURY PATIENT EDUCATION Description: Description: See Patient Education module for eduction specifics Outcome: Progressing   Problem: Consults Goal: Nutrition Consult-if indicated Outcome: Progressing Goal: Diabetes Guidelines if Diabetic/Glucose > 140 Description: If diabetic or lab glucose is > 140 mg/dl - Initiate Diabetes/Hyperglycemia Guidelines & Document Interventions  Outcome: Progressing   Problem: RH BOWEL ELIMINATION Goal: RH STG MANAGE BOWEL WITH ASSISTANCE Description: STG Manage Bowel with mod Assistance. Outcome: Progressing Goal: RH STG MANAGE BOWEL W/MEDICATION W/ASSISTANCE Description: STG Manage Bowel with Medication with min Assistance. Outcome: Progressing   Problem: RH BLADDER ELIMINATION Goal: RH STG MANAGE BLADDER WITH ASSISTANCE Description: STG Manage Bladder With min Assistance Outcome: Progressing   Problem: RH SKIN INTEGRITY Goal: RH STG SKIN FREE OF INFECTION/BREAKDOWN Description: No new skin breakdown while on rehab with min assist. Outcome: Progressing Goal: RH STG MAINTAIN SKIN INTEGRITY WITH ASSISTANCE Description: STG Maintain Skin Integrity With min Assistance. Outcome: Progressing Goal: RH STG ABLE TO PERFORM INCISION/WOUND CARE W/ASSISTANCE Description: STG Able To Perform Incision/Wound Care With min Assistance. Outcome: Progressing   Problem: RH SAFETY Goal: RH STG ADHERE TO SAFETY PRECAUTIONS W/ASSISTANCE/DEVICE Description: STG Adhere to Safety Precautions With mod Assistance and appropriate Device. Outcome: Progressing   Problem: RH PAIN MANAGEMENT Goal: RH STG PAIN MANAGED AT OR BELOW PT'S PAIN GOAL Description: <4 on a 0-10 pain scale. Outcome: Progressing   Problem: RH KNOWLEDGE DEFICIT BRAIN INJURY Goal: RH STG INCREASE KNOWLEDGE OF SELF CARE AFTER BRAIN INJURY Description: Pt and caregiver will demonstrate knowledge on medication management, dietary management, BP control, blood  glucose control, safety at home and in the community, and follow up care with the MD post discharge from CIR with min assist from staff. Outcome: Progressing   Problem: RH Vision Goal: RH LTG Vision (Specify) Outcome: Progressing

## 2019-10-16 NOTE — Therapy (Signed)
[facility] [address] [place], [place], [address] Phone: [phone]   Fax:  [phone]  Patient Details  Name: MANJINDER BREAU MRN: [id number] Date of Birth: 1945-06-14 Referring Provider:  No ref. provider found  Encounter Date: 10/16/2019   OCCUPATIONAL THERAPY DISCHARGE SUMMARY  Visits from Start of Care: 8  Current functional level related to goals / functional outcomes: Pt did not meet any goals due to not returning to outpatient O.T. secondary to decline and change in medical status   Remaining deficits: Same as initial eval   Education / Equipment: HEP, visual scanning strategies  Plan: Patient agrees to discharge.  Patient goals were not met. Patient is being discharged due to a change in medical status.  Pt was admitted to hospital and currently on inpatient rehab. ?????         Carey Bullocks, OTR/L 10/16/2019, 8:13 AM  [facility] [address] [place], [place], [address] Phone: [phone]   Fax:  [phone]

## 2019-10-17 ENCOUNTER — Inpatient Hospital Stay (HOSPITAL_COMMUNITY): Payer: Medicare Other | Admitting: Occupational Therapy

## 2019-10-17 ENCOUNTER — Encounter (HOSPITAL_COMMUNITY): Payer: Medicare Other | Admitting: Speech Pathology

## 2019-10-17 ENCOUNTER — Encounter (HOSPITAL_COMMUNITY): Payer: Medicare Other

## 2019-10-17 ENCOUNTER — Other Ambulatory Visit: Payer: Self-pay | Admitting: Radiation Therapy

## 2019-10-17 ENCOUNTER — Ambulatory Visit (HOSPITAL_COMMUNITY): Payer: Medicare Other

## 2019-10-17 ENCOUNTER — Encounter (HOSPITAL_COMMUNITY): Payer: Medicare Other | Admitting: Psychology

## 2019-10-17 LAB — GLUCOSE, CAPILLARY
Glucose-Capillary: 127 mg/dL — ABNORMAL HIGH (ref 70–99)
Glucose-Capillary: 126 mg/dL — ABNORMAL HIGH (ref 70–99)
Glucose-Capillary: 134 mg/dL — ABNORMAL HIGH (ref 70–99)
Glucose-Capillary: 157 mg/dL — ABNORMAL HIGH (ref 70–99)

## 2019-10-17 MED ORDER — ATORVASTATIN CALCIUM 80 MG PO TABS: 80.0000 mg | ORAL_TABLET | Freq: Every day | ORAL | 0 refills | Status: DC

## 2019-10-17 MED ORDER — SENNOSIDES-DOCUSATE SODIUM 8.6-50 MG PO TABS
2.0000 | ORAL_TABLET | Freq: Every day | ORAL | Status: DC
  Administered 2019-10-17: 2 via ORAL
  Filled 2019-10-17: qty 2

## 2019-10-17 MED ORDER — LEVETIRACETAM 500 MG PO TABS: 500.0000 mg | ORAL_TABLET | Freq: Two times a day (BID) | ORAL | 3 refills | Status: DC

## 2019-10-17 MED ORDER — RIVAROXABAN 20 MG PO TABS: 20.0000 mg | ORAL_TABLET | Freq: Every day | ORAL | 0 refills | Status: DC

## 2019-10-17 MED ORDER — DEXAMETHASONE 4 MG PO TABS: 4.0000 mg | ORAL_TABLET | Freq: Two times a day (BID) | ORAL | 0 refills | Status: DC

## 2019-10-17 MED ORDER — METOPROLOL SUCCINATE ER 25 MG PO TB24: 12.5000 mg | ORAL_TABLET | Freq: Every evening | ORAL | 0 refills | Status: DC

## 2019-10-17 MED ORDER — CALCIUM CITRATE 950 (200 CA) MG PO TABS: 200.0000 mg | ORAL_TABLET | Freq: Every day | ORAL | 0 refills | Status: AC

## 2019-10-17 MED ORDER — AMLODIPINE BESYLATE 10 MG PO TABS: 10.0000 mg | ORAL_TABLET | Freq: Every evening | ORAL | 0 refills | Status: DC

## 2019-10-17 NOTE — Progress Notes (Signed)
Physical Therapy Discharge Summary  Patient Details  Name: [name] MRN: [id number] Date of Birth: 19-Aug-1945  Today's Date: 10/17/2019 PT Individual Time: 1100-1203 PT Individual Time Calculation (min): 63 min    Patient has met 11 of 11 long term goals due to improved activity tolerance, improved balance, improved postural control, increased strength, ability to compensate for deficits and improved awareness.  Patient to discharge at an ambulatory level Supervision.   Patient's care partner is independent to provide the necessary physical and cognitive assistance at discharge.  Reasons goals not met: n/a  Recommendation:  Patient will benefit from ongoing skilled PT services in outpatient setting to continue to advance safe functional mobility, address ongoing impairments in balance, strength, functional mobility, gait, vision, safety awareness, patient/caregiver education, and minimize fall risk.  Equipment: RW  Reasons for discharge: treatment goals met  Patient/family agrees with progress made and goals achieved: Yes  Skilled Therapeutic Intervention: Patient in bed with his wife in the room for family education upon PT arrival. Patient alert and agreeable to PT session. Patient denied pain during session. Patient's wife present and participated in family education throughout session. Patient reported mild LE fatigue today from previous therapy sessions.   Therapeutic Activity: Bed Mobility: Patient performed rolling R/L and supine to/from sit independently in the ADL bed.  Transfers: Patient performed sit to/from stand from various household surfaces throughout session  with mod I using a RW. Patient's wife provided cues for bringing walker close to him before sitting x1.  Patient performed a simulated sedan height car transfer with supervision using RW. Provided cues for safe technique. Patient wife reports that he reaches to close the door in a way that appears unsafe, PT  instructed patient to allow his wife to close the door, as she will be there for supervision at this time. Patient and his wife in agreement.  Gait Training:  Patient ambulated >100 feet x2 and >300 feet x1 around the unit using RW with supervision. Ambulated as described below. Provided verbal cues for safe guarding on L side due to mild L ataxia leading to misstep intermittently. Patient's wife provided cues for erect posture and visual scanning to the L throughout. Patient ambulated up/down a ramp, over 10 feet of mulch (unlevel surface), and up/down a curb to simulate community ambulation over unlevel surfaces with close supervision using RW. Provided cues for technique and use of AD. Patient ascended/descended 16-6" steps total, first 4 steps using 1 rail with B UE support to simulate home entry and the other 12 steps with B rails to simulate stairs to the second level with close supervision throughout. Performed step-to gait pattern leading with R while ascending and L while descending (intermittently would alternate feet, but no change in assist needed). Provided verbal cues for technique and sequencing.   Neuromuscular Re-ed: Patient performed the following activities from HEP handout provided during session: Access Code: [email or web address]:  Single Leg Stance with Support - 2 x daily - 7 x weekly - 1 sets - 10 reps - 30 sec hold  Tandem Stance in Corner - 2 x daily - 7 x weekly - 1 sets - 5 reps - 30 sec hold  Sit to Stand without Arm Support - 2 x daily - 7 x weekly - 2 sets - 5 reps  Standing Balance in Corner with Eyes Closed - 2 x daily - 7 x weekly - 1 sets - 10 reps - 30 sec hold  Educated patient and his wife on  fall risk/prevention, home modifications to prevent falls, and activation of emergency services in the event of a fall during session.   PT demonstrated safe guarding technique for all mobility and patient's wife performed close supervision demonstrating safe guarding and providing  appropriate cues for patient with all mobility throughout session.   Patient sitting EOB with his wife in the room at end of session with breaks locked, bed alarm set, and all needs within reach.    PT Discharge Precautions/Restrictions Precautions Precautions: Fall Restrictions Weight Bearing Restrictions: No Vision/Perception  Vision - Assessment Eye Alignment: Within Functional Limits Ocular Range of Motion: Within Functional Limits Alignment/Gaze Preference: Within Defined Limits Tracking/Visual Pursuits: Able to track stimulus in all quads without difficulty Saccades: Within functional limits Convergence: Impaired (comment) Perception Perception: Impaired Inattention/Neglect: Does not attend to left visual field Praxis Praxis: Intact  Cognition Overall Cognitive Status: Impaired/Different from baseline Arousal/Alertness: Awake/alert Orientation Level: Oriented X4 Attention: Selective Selective Attention: Appears intact Memory: Appears intact Awareness: Appears intact Problem Solving: Appears intact Safety/Judgment: Appears intact Comments: inattention to left Sensation Sensation Light Touch: Appears Intact Hot/Cold: Appears Intact Proprioception: Impaired Detail Proprioception Impaired Details: Impaired LLE Additional Comments: mild L LE ataxia with inattention during gait, improved since admission Coordination Gross Motor Movements are Fluid and Coordinated: No Fine Motor Movements are Fluid and Coordinated: Yes Coordination and Movement Description: mild generalized weakness and balance deficits affecting functional mobility Finger Nose Finger Test: [facility] Heel Shin Test: [facility] Motor  Motor Motor: Ataxia;Hemiplegia Motor - Discharge Observations: Very mild weakness and ataxia with functional mobility  Mobility Bed Mobility Bed Mobility: Rolling Right;Rolling Left;Supine to Sit;Sit to Supine Rolling Right: Independent Rolling Left: Independent Supine to Sit:  Independent Sit to Supine: Independent Transfers Transfers: Sit to Stand;Stand to Sit;Stand Pivot Transfers Sit to Stand: Independent with assistive device Stand to Sit: Independent with assistive device Stand Pivot Transfers: Supervision/Verbal cueing Transfer (Assistive device): Rolling walker Locomotion  Gait Ambulation: Yes Gait Assistance: Minimal Assistance - Patient > 75%;Supervision/Verbal cueing Gait Distance (Feet): 300 Feet Assistive device: Rolling walker Gait Gait: Yes Gait Pattern: Step-through pattern;Decreased stance time - left;Decreased stride length;Decreased hip/knee flexion - right;Decreased hip/knee flexion - left;Decreased weight shift to left;Ataxic;Decreased trunk rotation;Trunk flexed;Wide base of support Gait velocity: decreased Stairs / Additional Locomotion Stairs: Yes Stairs Assistance: Supervision/Verbal cueing Stair Management Technique: Two rails (1 rail 2 steps, 2 rails 16 steps) Number of Stairs: 16 Height of Stairs: 6 Ramp: Minimal Assistance - Patient >75% Curb: Minimal Assistance - Patient >75% Wheelchair Mobility 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)  Trunk/Postural Assessment  Cervical Assessment Cervical Assessment: Exceptions to [facility] (forward head) Thoracic Assessment Thoracic Assessment: Exceptions to [facility] (kyphosis) Lumbar Assessment Lumbar Assessment: Exceptions to [facility] (posterior pelvic tilt) Postural Control Postural Control: Deficits on evaluation (decreased/delayed, improving)  Balance Balance Balance Assessed: Yes Standardized Balance Assessment Standardized Balance Assessment: Berg Balance Test Berg Balance Test Sit to Stand: Able to stand without using hands and stabilize independently Standing Unsupported: Able to stand safely 2 minutes Sitting with Back Unsupported but Feet Supported on Floor or Stool: Able to sit safely and securely 2 minutes Stand to Sit: Sits safely with minimal use of  hands Transfers: Able to transfer safely, minor use of hands Standing Unsupported with Eyes Closed: Able to stand 10 seconds safely Standing Ubsupported with Feet Together: Able to place feet together independently and stand 1 minute safely From Standing, Reach Forward with Outstretched Arm: Can reach forward >12 cm safely (5") From Standing Position,  Pick up Object from Floor: Able to pick up shoe safely and easily From Standing Position, Turn to Look Behind Over each Shoulder: Turn sideways only but maintains balance (limited by back pain) Turn 360 Degrees: Able to turn 360 degrees safely but slowly Standing Unsupported, Alternately Place Feet on Step/Stool: Able to complete >2 steps/needs minimal assist Standing Unsupported, One Foot in Front: Able to plae foot ahead of the other independently and hold 30 seconds Standing on One Leg: Tries to lift leg/unable to hold 3 seconds but remains standing independently Total Score: 44 Static Sitting Balance Static Sitting - Balance Support: No upper extremity supported;Feet unsupported Static Sitting - Level of Assistance: 7: Independent Dynamic Sitting Balance Dynamic Sitting - Balance Support: No upper extremity supported;Feet supported Dynamic Sitting - Level of Assistance: 7: Independent Dynamic Sitting - Balance Activities: Lateral lean/weight shifting;Forward lean/weight shifting;Reaching for objects Static Standing Balance Static Standing - Balance Support: No upper extremity supported;During functional activity Static Standing - Level of Assistance: 6: Modified independent (Device/Increase time) Dynamic Standing Balance Dynamic Standing - Balance Support: No upper extremity supported;During functional activity Dynamic Standing - Level of Assistance: 6: Modified independent (Device/Increase time) Extremity Assessment  RUE Assessment RUE Assessment: Within Functional Limits LUE Assessment LUE Assessment: Exceptions to [facility] General  Strength Comments: mildly decreased coordination but overall WFL RLE Assessment RLE Assessment: Within Functional Limits Active Range of Motion (AROM) Comments: Grossly WFL with functional mobility General Strength Comments: Grossly in sitting: 5/5 throughout, except hip flexion 4+/5 LLE Assessment LLE Assessment: Exceptions to [facility] Active Range of Motion (AROM) Comments: Grossly WFL with functional mobility General Strength Comments: Grossly in sitting: hip flexion and DF 4/5, otherwise 5/5 throughout    Creola Krotz L Diona Peregoy PT, DPT  10/17/2019, 12:37 PM

## 2019-10-17 NOTE — Progress Notes (Signed)
Occupational Therapy Session Note  Patient Details  Name: [name] MRN: [id number] Date of Birth: 20-Jun-1945  Today's Date: 10/17/2019 OT Individual Time: 0340-3524 OT Individual Time Calculation (min): 55 min    Short Term Goals: Week 1:  OT Short Term Goal 1 (Week 1): Pt will don pants with CGA demonstrating decreased posterior bias. OT Short Term Goal 2 (Week 1): Pt will visually scan to the L with min verbal cues while completing a grooming task. OT Short Term Goal 3 (Week 1): Pt will don socks with minA with LRAD.  Skilled Therapeutic Interventions/Progress Updates:    Family education session completed with pt's wife. Wife very receptive to all education and involved. Provided edu on safety awareness at home, fall risk prevention, energy conservation strategies, and compensatory ADL/IADL strategies. Pt completed shower at (S) level overall, using shower chair. Pt completed dressing EOB with (S) overall. Good use of previously taught safety strategies. Pt completed 200 ft of functional mobility with (S). Edu wife on proper guarding techniques and potential of L sided LOB/visual deficits. Also provided edu re BE FAST stroke s/s 2/2 higher risk of future CVA/continued glioblastoma recovery. Pt was passed off to SLP in room.   Therapy Documentation Precautions:  Precautions Precautions: Fall Precaution Comments:  L inattention Restrictions Weight Bearing Restrictions: No   Therapy/Group: Individual Therapy  Curtis Sites 10/17/2019, 6:52 AM

## 2019-10-17 NOTE — Progress Notes (Signed)
[place] PHYSICAL MEDICINE & REHABILITATION PROGRESS NOTE   Subjective/Complaints:  In good spirits. Pleased with progress. Feels a little slow this morning but slept well last night. Cleared his breakfast plate  ROS: Patient denies fever, rash, sore throat, blurred vision, nausea, vomiting, diarrhea, cough, shortness of breath or chest pain, joint or back pain, headache, or mood change.     Objective:   No results found. No results for input(s): WBC, HGB, HCT, PLT in the last 72 hours. No results for input(s): NA, K, CL, CO2, GLUCOSE, BUN, CREATININE, CALCIUM in the last 72 hours. No intake or output data in the 24 hours ending 10/17/19 1120   Physical Exam: Vital Signs Blood pressure (!) 157/84, pulse (!) 58, temperature 97.7 F (36.5 C), temperature source Oral, resp. rate 18, height 6\' 2"  (1.88 m), weight 94.7 kg, SpO2 98 %. Constitutional: No distress . Vital signs reviewed. HEENT: EOMI, oral membranes moist Neck: supple Cardiovascular: RRR without murmur. No JVD    Respiratory/Chest: CTA Bilaterally without wheezes or rales. Normal effort    GI/Abdomen: BS +, non-tender, non-distended Ext: no clubbing, cyanosis, or edema Psych: pleasant and cooperative Neurological: good insight and awareness Motor: RUE: 5/5 proximal distal LUE: 4 to 4+/5 proximal distal, RLE: 4+/5 proximal distal LLE: 4+ to 5/5 proximal to distal  No focal sensory abnl. Mild left inattention? Skin: Skin is warm and dry.         Assessment/Plan: 1. 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  Physiatrist is providing close team supervision and 24 hour management of active medical problems listed below.  Physiatrist and rehab team continue to assess barriers to discharge/monitor patient progress toward functional and medical goals  Care Tool:  Bathing    Body parts bathed by  patient: Right arm, Left arm, Chest, Abdomen, Right upper leg, Left upper leg, Front perineal area, Buttocks, Face, Right lower leg, Left lower leg   Body parts bathed by helper: Right lower leg, Left lower leg     Bathing assist Assist Level: Supervision/Verbal cueing     Upper Body Dressing/Undressing Upper body dressing   What is the patient wearing?: Pull over shirt    Upper body assist Assist Level: Supervision/Verbal cueing    Lower Body Dressing/Undressing Lower body dressing      What is the patient wearing?: Pants, Underwear/pull up     Lower body assist Assist for lower body dressing: Supervision/Verbal cueing     Toileting Toileting    Toileting assist Assist for toileting: Supervision/Verbal cueing     Transfers Chair/bed transfer  Transfers assist     Chair/bed transfer assist level: Supervision/Verbal cueing Chair/bed transfer assistive device: Research officer, political party   Ambulation assist      Assist level: Contact Guard/Touching assist Assistive device: Cane-straight Max distance: 450 ft   Walk 10 feet activity   Assist     Assist level: Supervision/Verbal cueing Assistive device: Cane-straight   Walk 50 feet activity   Assist    Assist level: Contact Guard/Touching assist Assistive device: Cane-straight    Walk 150 feet activity   Assist    Assist level: Contact Guard/Touching assist Assistive device: Cane-straight    Walk 10 feet on uneven surface  activity   Assist     Assist level: Minimal Assistance - Patient > 75%     Wheelchair     Assist Will patient use wheelchair at discharge?:  No (no long term goals set by PT) Type of Wheelchair: Manual    Wheelchair assist level: Minimal Assistance - Patient > 75% Max wheelchair distance: 155 ft    Wheelchair 50 feet with 2 turns activity    Assist        Assist Level: Minimal Assistance - Patient > 75%   Wheelchair 150 feet activity      Assist      Assist Level: Minimal Assistance - Patient > 75%   Blood pressure (!) 157/84, pulse (!) 58, temperature 97.7 F (36.5 C), temperature source Oral, resp. rate 18, height 6\' 2"  (1.88 m), weight 94.7 kg, SpO2 98 %.  Medical Problem List and Plan: 1.  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  CONTINUE DECADRON 4 MG BID ON DISCHARGE. Plan outpatient MRI with medical oncology             -patient may shower             -ELOS/Goals: 9--13 days/supervision             -Continue CIR PT, OT, SLP--team conference tomorrow  -Neuro-onc recommended dosing decadron at 4mg  bid at discharge. Reduced from QID to TID on 6/28 2.  Antithrombotics: -DVT/anticoagulation: Xarelto             -antiplatelet therapy: N/A 3. Pain Management: Tylenol as needed. Pain is well controlled.  4. Mood: 5 emotional support             -antipsychotic agents: N/A 5. Neuropsych: This patient is capable of making decisions on his own behalf. 6. Skin/Wound Care: Routine skin checks 7. Fluids/Electrolytes/Nutrition: Routine in and outs.      Na low at 131 on 6/24. Follow labs never done yesterday.    -will order bmet for 6/30 8.  Chronic diastolic congestive heart failure.  Monitor for any signs of fluid overload             Daily weights 9.  Diabetes mellitus with peripheral neuropathy.  Hemoglobin A1c 6.9.  Levemir 5 units twice daily.  Monitor while on Decadron therapy  6/24: CBGs elevated. Increase Levemir to 6U BID.   6/25: CBGs elevated. Change diet to heart healthy/carb modified.    CBG (last 3)  Recent Labs    10/16/19 1723 10/16/19 2116 10/17/19 0623  GLUCAP 131* 171* 127*    6/25- 140s-240s- will increase Levemir to 8 units BID  6/27-29: CBGs showing improvement               -reducing steroids  as above   -still on  levemir 8u bid 10.  Seizure prophylaxis.  Keppra 500 mg twice daily.  No seizure activity 11.  Hypertension.  Toprol-XL 25 mg daily, Norvasc 10 mg daily.    6/26- BP well controlled- con't regimen- pulse better 67 this AM  6/28-29  bp controlled, HR in 50-60's. No changes 12.  Hyperlipidemia.  Lipitor 13.  History of prostate cancer with hematuria.  Monitor while on Xarelto 14.  CAD/CABG 2010.  Continue Xarelto.  No chest pain or shortness of breath 15. Hypocalcemia: started supplement. 17. Constipation  Had bm on 6/26 --did not receive mg citrate  6/29- add prn sorbitol to regimen and scheduled senna-s     LOS: 6 days A FACE TO [place] 10/17/2019, 11:20 AM

## 2019-10-17 NOTE — Progress Notes (Signed)
Occupational Therapy Session Note  Patient Details  Name: [name] MRN: [id number] Date of Birth: 07/06/1945  Today's Date: 10/17/2019 OT Individual Time: 1430-1525 OT Individual Time Calculation (min): 55 min    Short Term Goals: Week 1:  OT Short Term Goal 1 (Week 1): Pt will don pants with CGA demonstrating decreased posterior bias. OT Short Term Goal 2 (Week 1): Pt will visually scan to the L with min verbal cues while completing a grooming task. OT Short Term Goal 3 (Week 1): Pt will don socks with minA with LRAD.  Skilled Therapeutic Interventions/Progress Updates:    Upon entering the room, pt supine in bed with wife present in the room. Caregiver attended hands on education this morning and reports everything went well and they were prepared for discharge. They did have questions about discharge time/expectations for tomorrow which OT answered. OT provided education for energy conservation and provided handout on this topic. They were active listeners and active participants in education. Pt requesting to go to bathroom and ambulates with RW into bathroom with supervision. Hygiene, clothing management, and transfer all performed with supervision this session. Pt and caregiver with no further concerns at this time. Call bell and all needed items within reach. Bed alarm activated.   Therapy Documentation Precautions:  Precautions Precautions: Fall Precaution Comments:  L inattention Restrictions Weight Bearing Restrictions: No General:   Vital Signs:  Pain: Pain Assessment Pain Scale: 0-10 Pain Score: 0-No pain ADL: ADL Eating: Independent Where Assessed-Eating: Edge of bed Grooming: Independent Where Assessed-Grooming: Standing at sink Upper Body Bathing: Independent Where Assessed-Upper Body Bathing: Shower Lower Body Bathing: Supervision/safety Where Assessed-Lower Body Bathing: Shower Upper Body Dressing: Modified independent (Device) Where Assessed-Upper  Body Dressing: Edge of bed Lower Body Dressing: Supervision/safety Where Assessed-Lower Body Dressing: Edge of bed Toileting: Supervision/safety Where Assessed-Toileting: Glass blower/designer: Distant supervision Armed forces technical officer Method: Counselling psychologist: Emergency planning/management officer Transfer: Distant supervision Social research officer, government Method: Heritage manager: Civil engineer, contracting with back Vision Baseline Vision/History: Wears glasses Wears Glasses: At all times Patient Visual Report: No change from baseline (all visual deficits have been going on since tumor recection) Vision Assessment?: Yes Eye Alignment: Within Functional Limits Ocular Range of Motion: Within Functional Limits Alignment/Gaze Preference: Within Defined Limits Tracking/Visual Pursuits: Able to track stimulus in all quads without difficulty Saccades: Within functional limits Convergence: Impaired (comment) Visual Fields: Left homonymous hemianopsia;Left visual field deficit Perception  Perception: Impaired Inattention/Neglect: Does not attend to left visual field Praxis Praxis: Intact Exercises:   Other Treatments:     Therapy/Group: Individual Therapy  Gypsy Decant 10/17/2019, 3:26 PM

## 2019-10-17 NOTE — Patient Care Conference (Signed)
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 Dyslipidemia   . Seizure prophylaxis   . Chronic diastolic congestive heart failure ([place])   . Diabetic peripheral neuropathy ([place])   . Palliative care by specialist   . Weakness 10/08/2019  . Vasogenic edema ([place]) 10/08/2019  . Gross hematuria 10/08/2019  . DNR (do not resuscitate) discussion 07/13/2019  . S/P craniotomy 07/04/2019  . 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 Respiratory failure ([place])   . Encephalopathy acute   . Brain mass 07/01/2019  . Fever 07/01/2019  . Atherosclerosis of native artery of both lower extremities with intermittent claudication ([place]) 08/23/2018  . Coronary artery disease without angina pectoris 08/23/2018  . Dyspnea on exertion 08/23/2018  . Biochemically recurrent malignant neoplasm of prostate 08/16/2018  . S/P carotid endarterectomy 08/20/2015  . Essential hypertension 08/20/2015  . 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 HLD (hyperlipidemia) 08/20/2015  . Carotid artery stenosis, symptomatic 07/03/2015  . Carotid stenosis, right   . TIA (transient ischemic attack) 06/18/2015  . ASCVD (arteriosclerotic cardiovascular disease) 06/18/2015  . Diabetes mellitus without complication ([place]) 19/62/2297  . Resistant hypertension 06/18/2015  . Hyperlipidemia 06/18/2015  . Spinal stenosis of lumbar region with neurogenic claudication 09/06/2014  . Atherosclerosis  of native artery of extremity with intermittent claudication ([place]) 03/09/2013  . HYPERLIPIDEMIA 04/08/2009  . TOBACCO ABUSE 04/08/2009  . ATHEROSCLEROTIC CARDIOVASCULAR DISEASE 04/08/2009  . INTERMITTENT VERTIGO 04/08/2009  . ADENOCARCINOMA, PROSTATE 03/21/2009  . ABDOMINAL PAIN -GENERALIZED 03/21/2009  . History of coronary artery bypass graft 06/28/2008    Expected Discharge Date: Expected Discharge Date: 10/18/19  Team Members Present: Physician leading conference: Dr. Alger Simons Care Coodinator Present: Dorthula Nettles, RN, BSN, CRRN;[name], [place] Nurse Present: Other (comment) Suezanne Cheshire, RN) PT Present: Apolinar Junes, PT OT Present: Laverle Hobby, OT SLP Present: Weston Anna, SLP PPS Coordinator present : Gunnar Fusi, SLP     Current Status/Progress Goal Weekly Team Focus  Bowel/Bladder   Mod I-supervision overall  Mod I-supervision overall  Balance, gait and stair training, patient/family education, d/c planning, fall prevention   Swallow/Nutrition/ Hydration             ADL's   (S) overall for ADLs and for transfers- pt at goal level. Still reporting visual deficits with peripheral vision and L inattention  Supervision ADLs and transfers  Functional mobility, d/c planning, ADL retraining, dynamic standing balance   Mobility             Communication             Safety/Cognition/ Behavioral Observations  Supervision  Supervision  Family education   Pain   Pt has no complaints of pain.  To remain pain free.  Assess pain q shift or as needed.   Skin   Pt skin is intact. He has an old abrasion on left knee and some ecchymosis to left leg.  To promote  healing and to prevent breakdown from occurring.  Assess skin q shift or prn.    Rehab Goals Patient on target to meet rehab goals: Yes *See Care Plan and progress notes for long and short-term goals.     Barriers to Discharge  Current Status/Progress Possible Resolutions Date Resolved    Nursing                  PT  Other (comments)  Family education  Completing family education today, patient will be ready for d/c tomorrow.           OT Inaccessible home environment  Pt able to navigate stairs, on 2nd floor of home Pt ready for d/c           SLP                Care Coordinator Decreased caregiver support;Lack of/limited family support              Discharge Planning/Teaching Needs:  D/c to home with 24/7 care from wife; PRN support from son who lives locally  Family education on Tuesday (6/28) with pt wife.   Team Discussion:  Continent of bowel/bladder. Pain controlled. Vital signs stable. Blood sugars controlled and should decrease as steroid is tapered down, per MD. Therapy recommending Outpatient Therapy. Overall progressing towards discharge of 10/18/19.  Revisions to Treatment Plan:  MD added Senna for Bowels, tapering Decadron 4 mg q 6 hr to q 8 hr.    Medical Summary Current Status: gbm, steroid taper. sugars under better control, minimal pain Weekly Focus/Goal: weaning steroids, bp control, pain mgt  Barriers to Discharge: Medical stability   Possible Resolutions to Barriers: daily labs, weaning steroids, adjusting meds   Continued Need for Acute Rehabilitation Level of Care: The patient requires daily medical management by a physician with specialized training in physical medicine and rehabilitation for the following reasons: Direction of a multidisciplinary physical rehabilitation program to maximize functional independence : Yes Medical management of patient stability for increased activity during participation in an intensive rehabilitation regime.: Yes Analysis of laboratory values and/or radiology reports with any subsequent need for medication adjustment and/or medical intervention. : Yes   I attest that I was present, lead the team conference, and concur with the assessment and plan of the team.   [name] 10/17/2019, 12:41 PM

## 2019-10-17 NOTE — Progress Notes (Signed)
Speech Language Pathology Discharge Summary  Patient Details  Name: [name] MRN: [id number] Date of Birth: July 05, 1945  Today's Date: 10/17/2019 SLP Individual Time: 0930-1000 SLP Individual Time Calculation (min): 30 min   Skilled Therapeutic Interventions:  Skilled treatment session focused on completion of patient and family education with the patient's wife. Both were educated in regards to patient's current cognitive functioning and strategies to utilize at home to maximize attention and visual scanning to the left field of environment. Both verbalized understanding and all questions answered at this time. Patient left supine in bed with wife present.   Patient has met 2 of 2 long term goals.  Patient to discharge at overall Supervision level.   Reasons goals not met: N/A   Clinical Impression/Discharge Summary: Patient has made excellent gains and has met 2 of 2 LTGs this admission. Currently, patient requires overall extra time and supervision level verbal cues for use of compensatory strategies in regards to visual scanning and attention to left field of environment. Patient also demonstrates improved emergent awareness with increased ability to self-monitor and correct errors throughout functional tasks. Patient and family education is complete and patient will discharge home with supervision from family. Patient would benefit from f/u SLP services to maximize his cognitive functioning and overall functional independence in order to reduce caregiver burden.   Care Partner:  Caregiver Able to Provide Assistance: Yes  Type of Caregiver Assistance: Physical;Cognitive  Recommendation:  Outpatient SLP;24 hour supervision/assistance  Rationale for SLP Follow Up: Maximize cognitive function and independence;Reduce caregiver burden   Equipment: N/A   Reasons for discharge: Discharged from hospital;Treatment goals met   Patient/Family Agrees with Progress Made and Goals Achieved:  Yes    [name] 10/17/2019, 6:19 AM

## 2019-10-17 NOTE — Discharge Summary (Signed)
Physician Discharge Summary  Patient ID: [name] MRN: [id number] DOB/AGE: 1946/01/25 74 y.o.  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 Left arm weakness. Headache. Nausea. EXAM: CT HEAD WITHOUT CONTRAST TECHNIQUE: Contiguous axial images were obtained from the base of the skull through the vertex without intravenous contrast. COMPARISON:  Head MRI 07/22/2019 FINDINGS: Brain: Sequelae of right occipital craniotomy for glioblastoma debulking are again identified. There is minimal residual gas in the operative bed. There is new severe vasogenic edema extending from the right occipital lobe throughout the right temporal lobe, white matter tracts posterior to the basal ganglia, into the splenium of the corpus callosum, and into the right parietal lobe with regional sulcal effacement, subtotal effacement of the right lateral ventricle, and 8 mm of leftward midline shift. The left lateral ventricle is stable to at most minimally larger than on the prior MRI without evidence of frank hydrocephalus. Curvilinear hyperdensity along the superior aspect of and posterior to the third ventricle has density favoring calcification over hemorrhage, and no acute intracranial hemorrhage is evident elsewhere. There is an unchanged 1 cm dense calcification along the superomedial aspect of the left tentorium. No acute large territory cortically based infarct or extra-axial fluid collection is identified. Vascular: Calcified atherosclerosis at the skull base. Skull: Right occipital  craniotomy. Sinuses/Orbits: Paranasal sinuses and mastoid air cells are clear. Unremarkable orbits. Other: None. IMPRESSION: 1. New severe vasogenic edema throughout the posterior right cerebral hemisphere with 8 mm of leftward midline shift. 2. No evidence of acute intracranial abnormality. Electronically Signed   By: Logan Bores M.D.   On: 10/08/2019 20:20    Labs:  Basic Metabolic Panel: Recent Labs  Lab 10/11/19 0928 10/12/19 0515  NA 131* 131*  K 4.5 4.6  CL 94* 95*  CO2 29 27  GLUCOSE 227* 177*  BUN 26* 28*  CREATININE 0.92 0.85  CALCIUM 8.8* 8.7*  MG 1.8  --   PHOS 4.0  --     CBC: Recent Labs  Lab 10/11/19 0928 10/12/19 0515  WBC 7.0 6.5  NEUTROABS 6.6 5.9  HGB 12.8* 12.7*  HCT 37.6* 37.1*  MCV 84.9 84.5  PLT 169 145*    CBG: Recent Labs  Lab 10/16/19 2116 10/17/19 0623 10/17/19 1208 10/17/19 1731 10/17/19 2111  GLUCAP 171* 127* 126* 134* 157*   Family history.  Mother with kidney disease.  Father with CVA.  Negative colon cancer prostate cancer pancreatic cancer  Brief HPI:   [name] is a 74 y.o. 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, prostate cancer with radiation therapy prostatectomy, CAD with CABG maintained on Xarelto, chronic diastolic congestive heart failure.  Lives with spouse two-level home he had been receiving outpatient therapies.  Presented 10/08/2019 with increasing left-sided weakness headache nausea vomiting as well as bouts of hematuria.  Admission chemistries sodium 132 glucose 171 creatinine 0.88 hemoglobin 12.7.  CT of the head showed new severe vasogenic edema throughout the posterior right cerebral hemisphere with 8 mm leftward midline shift.  No evidence of acute intracranial abnormality.  Follow-up medical  oncology maintained on Decadron therapy.  Palliative  care consulted for goals of care.  Remains on Keppra for seizure prophylaxis.  Patient's chronic Xarelto had initially been held due to some hematuria later resumed.  Patient was admitted for a comprehensive rehab program.   Hospital Course: [name] was admitted to rehab 10/11/2019 for inpatient therapies to consist of PT, ST and OT at least three hours five days a week. Past admission physiatrist, therapy team and rehab RN have worked together to provide customized collaborative inpatient rehab.  Pertaining to patient's right occipital glioblastoma he will continue Decadron therapy 4 mg twice daily and follow-up outpatient for MRI in medical oncology [name].  He continued on chronic Xarelto no bleeding episodes.  No chest pain or shortness of breath.  He exhibited no signs of fluid overload.  He did have a history of diabetes mellitus hemoglobin A1c 6.9 closely monitored while on Decadron.  Keppra ongoing for seizure prophylaxis no seizure activity.  Blood pressure controlled on Toprol as well as Norvasc would follow-up with his primary MD.  Lipitor for hyperlipidemia.   Blood pressures were monitored on TID basis and controlled  Diabetes has been monitored with ac/hs CBG checks and SSI was use prn for tighter BS control.    Rehab course: During patient's stay in rehab weekly team conferences were held to monitor patient's progress, set goals and discuss barriers to discharge. At admission, patient required minimal assist stand pivot transfers moderate assist 20 feet rolling walker minimal assist upper body moderate assist lower body minimal assist upper body dressing moderate assist lower body dressing minimal assist toilet transfers  Physical exam.  Blood pressure 97/52 pulse 67 temperature 97.6 respirations 14 oxygen saturation 94% room air Constitutional.  No distress HEENT Head.  Normocephalic and atraumatic Eyes.  Pupils round and reactive to light no discharge without  nystagmus Neck.  Supple nontender no JVD without thyromegaly Cardiac regular rate rhythm without any extra sounds or murmur heard Abdomen.  Soft nontender positive bowel sounds without rebound Respiratory effort normal no respiratory distress without wheeze Neurological.  Alert oriented makes good eye contact with examiner Motor.  Right upper extremity 5/5 proximal distal Left upper extremity 4+/5 proximal distal Right lower extremity 4+/5 proximal distal Left lower extremity 4 - 4+/5 proximal distal  He/  has had improvement in activity tolerance, balance, postural control as well as ability to compensate for deficits. He/ has had improvement in functional use RUE/LUE  and RLE/LLE as well as improvement in awareness.  Patient perform supine to sit modified independent ongoing family teaching patient donned his shoes minimal assist.  Perform sit to stand x6 supervision without assistive device Berg balance testing 37-45.  Instructed patient to use rolling walker for supervision.  He can gather his belongings for activities delivered and homemaking.  Speech therapy follow-up education in regards to patient's current cognitive function and strategies to utilize at home to maximize his attention and visual scanning.  Patient made excellent gains in his overall recall.  Currently requires overall extra time and supervision with verbal cues.  Full family teaching completed and discharged home       Disposition: Discharged home    Diet: Diabetic diet  Special Instructions: No driving smoking or alcohol  Medications at discharge 1.  Tylenol as needed 2.  Norvasc 10 mg daily 3.  Lipitor 80 mg p.o. daily 4.  Calcium citrate 200 mg daily 5.  Decadron 4 mg twice daily 6.  Levemir 8 units  twice daily 7.  Keppra 500 mg p.o. twice daily 8.  Toprol-XL 12.5 mg daily 9.  Xarelto 20 mg p.o. daily  30-35 minutes were spent completing discharge summary and discharge planning    Follow-up  Information    Meredith Staggers, MD Follow up.   Specialty: Physical Medicine and Rehabilitation Why: As directed Contact information: [address] [place] [address] [phone]        Ventura Sellers, MD Follow up.   Specialties: Psychiatry, Neurology, Oncology Why: Call for appointment Contact information: [place] [address] [phone]               Signed: Lavon Paganini O'Brien 10/18/2019, 5:06 AM

## 2019-10-17 NOTE — Consult Note (Signed)
Neuropsychological Consultation   Patient:   [name]   DOB:   April 11, 1946  MR Number:  [id number]  Location:  [facility] [id number] [place] [address] Dept: [facility]: [phone]           Date of Service:   10/17/2019  Start Time:   1 PM End Time:   2 PM  Provider/Observer:  Ilean Skill, Psy.D.       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  The patient also history of prostate cancer status post radiation as well as prostatectomy, CAD with CABG 2671, chronic diastolic congestive heart failure, type 2 diabetes, hypertension, hyperlipidemia, sleep apnea with no continued CPAP use.  The patient presented recently with increasing left hemiparesis, headache, nausea, vomiting as well as bouts of hematuria.  CT of head showed new severe vasogenic edema throughout the posterior right cerebral hemisphere with 8 mm leftward midline shift.  No evidence of acute intracranial abnormality.  Palliative care consulted for goals of care.  Patient remains on [place] for seizure prophylaxis.  Reason for Service:  The patient was referred for neuropsychological consultation due to coping and adjustment issues and dealing with significant health and medical issues and extended hospital stay.  Below is the HPI for the current admission.  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, prostate cancer status post  radiation as well as prostatectomy, CAD with CABG 2010 maintained on Xarelto, chronic diastolic congestive heart failure, type 2 diabetes mellitus, hypertension, hyperlipidemia, sleep apnea not on CPAP.  History taken from chart review, patient, and wife.  Patient lives with spouse.  Two-level home bed and bath upstairs.  He had been receiving outpatient therapies at neuro rehab.  He presented on 09/2019 with increasing left hemiparesis, headache, nausea vomiting as well as bouts of hematuria.  Admission chemistries with sodium 132, glucose 171, creatinine 0.88, hemoglobin 12.7, urinalysis negative nitrite.  CT of the head showed new severe vasogenic edema throughout the posterior right cerebral hemisphere with 8 mm leftward midline shift.  No evidence of acute intracranial abnormality.  Follow-up medical oncology services maintained on Decadron therapy.  Palliative care consulted for goals of care.  Patient remains on [place] for seizure prophylaxis.  Patient's chronic Xarelto had initially been held due to some hematuria since resumed.  Therapy evaluations completed and patient was admitted for a comprehensive rehab program.  Please see preadmission assessment from earlier today as well.  Current Status:  The patient was oriented but somewhat lethargic throughout the visit today.  His wife was also present in facilitated in providing some answers.  The patient reports that he is coping as well as to be expected.  He reports that he has times of sadness and coping issues but overall he is dealing with everything as well as to be expected.  The patient reports that it is been a long complicated medical course for him recently and he has dealt with radiation and chemotherapy.  The patient reports that he is  actively working on PT and OT regimens.  He reports that he does not have significant vision changes but does have residual weakness.  The patient reports that he has been frustrated for him as he is no longer  able to drive or do things around the house such as mow his grass or help out with household activities.  The patient reports that this is been a change for him and he is always wanted to keep up with things around the house and take her in the garage.  The patient reports that the extended hospital stay has been manageable and he denies any severe symptoms of depression or anxiety and he feels like his emotional responses are appropriate to the situation.  Behavioral Observation: [name]  presents as a 74 y.o.-year-old Right African American Male who appeared his stated age. his dress was Appropriate and he was Well Groomed and his manners were Appropriate to the situation.  his participation was indicative of Appropriate and Redirectable behaviors.  There were any physical disabilities noted.  he displayed an appropriate level of cooperation and motivation.     Interactions:    Active Appropriate and Redirectable  Attention:   abnormal and attention span appeared shorter than expected for age  Memory:   abnormal; remote memory intact, recent memory impaired  Visuo-spatial:  not examined  Speech (Volume):  low  Speech:   normal; slowed response style  Thought Process:  Coherent and Relevant  Though Content:  WNL; not suicidal and not homicidal  Orientation:   person, place, time/date and situation  Judgment:   Good  Planning:   Fair  Affect:    Blunted and Lethargic  Mood:    Dysphoric  Insight:   Fair  Intelligence:   high  Medical History:   Past Medical History:  Diagnosis Date  . Anxiety   . Arthritis   . ASCVD (arteriosclerotic cardiovascular disease)    03/2004: DES x2 to the RCA; IMI with stent occlusion in 02/2005- suboptimal Plavix compliance  . CAD (coronary artery disease)   . Carcinoma of prostate ([place])    Clopidogrel held for biopsy  . Constipation due to pain medication   . Diabetes mellitus without complication ([place])   . Diabetic neuropathy  ([place])   . Headache 2021  . Hyperlipidemia   . Hypertension   . Mild depression ([place])   . Morbid obesity ([place])   . Myocardial infarction ([place])   . Shortness of breath dyspnea   . Sleep apnea    no longer uses cpap  . Stroke ([place] [facility]) 2017  . Tobacco abuse    stopped smoking 06/28/09       Psychiatric History:  The patient has a history of anxiety but denies any significant exacerbation beyond what would be expected given the actual medical status he is dealing with.  Family Med/Psych History:  Family History  Problem Relation Age of Onset  . Kidney disease Mother   . Stroke Father   . Breast cancer Sister        breast progressed into bone  . Colon cancer Neg Hx   . Prostate cancer Neg Hx   . 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  The patient also history of prostate cancer status post radiation  as well as prostatectomy, CAD with CABG 2778, chronic diastolic congestive heart failure, type 2 diabetes, hypertension, hyperlipidemia, sleep apnea with no continued CPAP use.  The patient presented recently with increasing left hemiparesis, headache, nausea, vomiting as well as bouts of hematuria.  CT of head showed new severe vasogenic edema throughout the posterior right cerebral hemisphere with 8 mm leftward midline shift.  No evidence of acute intracranial abnormality.  Palliative care consulted for goals of care.  Patient remains on [place] for seizure prophylaxis.  The patient was oriented but somewhat lethargic throughout the visit today.  His wife was also present in facilitated in providing some answers.  The patient reports that he is coping as well as to be expected.  He reports that he has times of sadness and coping issues but overall he is dealing  with everything as well as to be expected.  The patient reports that it is been a long complicated medical course for him recently and he has dealt with radiation and chemotherapy.  The patient reports that he is actively working on PT and OT regimens.  He reports that he does not have significant vision changes but does have residual weakness.  The patient reports that he has been frustrated for him as he is no longer able to drive or do things around the house such as mow his grass or help out with household activities.  The patient reports that this is been a change for him and he is always wanted to keep up with things around the house and take her in the garage.  The patient reports that the extended hospital stay has been manageable and he denies any severe symptoms of depression or anxiety and he feels like his emotional responses are appropriate to the situation.  Disposition/Plan:  The patient appears to be managing and coping fairly well given the circumstances.  He denies any acute exacerbation of his anxiety or any depressive symptoms beyond appropriate mood responses given his situation.  The patient reports that he has been talking with palliative care about treatment goals going forward and overall he is coping with his current medical status.        Electronically Signed   _______________________ Ilean Skill, Psy.D.

## 2019-10-17 NOTE — Progress Notes (Signed)
Occupational Therapy Discharge Summary  Patient Details  Name: ZABIAN SWAYNE MRN: [id number] Date of Birth: 1945/11/03   Patient has met 12 of 12 long term goals due to improved activity tolerance, improved balance, postural control, ability to compensate for deficits, functional use of  LEFT upper extremity, improved attention, improved awareness and improved coordination.  Patient to discharge at overall Supervision level.  Patient's care partner is independent to provide the necessary physical assistance at discharge.  Family education has been completed and pt's wife has demonstrated ability to provide supervision level care for patient.   Reasons goals not met: All treatment goals met.  Recommendation:  Patient will benefit from ongoing skilled OT services in outpatient setting to continue to advance functional skills in the area of BADL and iADL.  Equipment: shower chair  Reasons for discharge: treatment goals met and discharge from hospital  Patient/family agrees with progress made and goals achieved: Yes  OT Discharge Precautions/Restrictions  Precautions Precautions: Fall Precaution Comments:  L inattention Restrictions Weight Bearing Restrictions: No Pain Pain Assessment Pain Scale: 0-10 Pain Score: 0-No pain ADL ADL Eating: Independent Where Assessed-Eating: Edge of bed Grooming: Independent Where Assessed-Grooming: Standing at sink Upper Body Bathing: Independent Where Assessed-Upper Body Bathing: Shower Lower Body Bathing: Supervision/safety Where Assessed-Lower Body Bathing: Shower Upper Body Dressing: Modified independent (Device) Where Assessed-Upper Body Dressing: Edge of bed Lower Body Dressing: Supervision/safety Where Assessed-Lower Body Dressing: Edge of bed Toileting: Supervision/safety Where Assessed-Toileting: Glass blower/designer: Distant supervision Armed forces technical officer Method: Counselling psychologist: Emergency planning/management officer  Transfer: Distant supervision Social research officer, government Method: Heritage manager: Civil engineer, contracting with back Vision Baseline Vision/History: Wears glasses Wears Glasses: At all times Patient Visual Report:  (L homonymous hemianopsia since glioblastoma dx) Vision Assessment?: Yes Eye Alignment: Within Functional Limits Ocular Range of Motion: Within Functional Limits Alignment/Gaze Preference: Within Defined Limits Tracking/Visual Pursuits: Able to track stimulus in all quads without difficulty Saccades: Within functional limits Visual Fields: Left homonymous hemianopsia;Left visual field deficit Perception  Perception: Impaired Inattention/Neglect: Does not attend to left visual field (greatly improved, compensates well) Praxis Praxis: Intact Cognition Overall Cognitive Status: Impaired/Different from baseline Arousal/Alertness: Awake/alert Orientation Level: Oriented X4 Attention: Selective Selective Attention: Appears intact Memory: Appears intact Awareness: Appears intact Problem Solving: Appears intact Safety/Judgment: Appears intact Comments: inattention to left Sensation Sensation Light Touch: Appears Intact Hot/Cold: Appears Intact Proprioception: Impaired Detail Proprioception Impaired Details: Impaired LLE Coordination Gross Motor Movements are Fluid and Coordinated: Yes Fine Motor Movements are Fluid and Coordinated: Yes Coordination and Movement Description: Still has some LLE weakness that affect dynamic balance and speed but overall fluid and coordinated. Finger Nose Finger Test: [facility] Motor  Motor Motor: Ataxia;Hemiplegia Motor - Discharge Observations: Very mild weakness and ataxia with functional mobility Mobility  Bed Mobility Bed Mobility: Rolling Right;Rolling Left;Supine to Sit;Sit to Supine Rolling Right: Independent Rolling Left: Independent Supine to Sit: Independent Sit to Supine: Independent Transfers Sit to Stand: Independent  with assistive device Stand to Sit: Independent with assistive device  Trunk/Postural Assessment  Cervical Assessment Cervical Assessment: Exceptions to [facility] (forward head) Thoracic Assessment Thoracic Assessment: Exceptions to [facility] (kyphotic posture) Lumbar Assessment Lumbar Assessment: Exceptions to [facility] (posterior pelvic tilt) Postural Control Postural Control: Deficits on evaluation (limited and delayed)  Balance Balance Balance Assessed: Yes Static Sitting Balance Static Sitting - Balance Support: No upper extremity supported;Feet unsupported Static Sitting - Level of Assistance: 7: Independent Dynamic Sitting Balance Dynamic Sitting - Balance Support: No upper extremity supported;Feet  supported Dynamic Sitting - Level of Assistance: 7: Independent Static Standing Balance Static Standing - Balance Support: No upper extremity supported;During functional activity Static Standing - Level of Assistance: 6: Modified independent (Device/Increase time) Dynamic Standing Balance Dynamic Standing - Balance Support: No upper extremity supported;During functional activity Dynamic Standing - Level of Assistance: 6: Modified independent (Device/Increase time) Extremity/Trunk Assessment RUE Assessment RUE Assessment: Within Functional Limits LUE Assessment LUE Assessment: Exceptions to [facility] Strength Comments: mildly decreased coordination but overall WFL   Curtis Sites 10/17/2019, 11:43 AM

## 2019-10-17 NOTE — Progress Notes (Signed)
Patient ID: [name], male   DOB: [birth year], 74 y.o.   MRN: [id number]   Per medical team, pt can d/c tomorrow. Pt will need RW and resume outpatient PT/OT/ST. SW to discussed with pt and wife.   *SW met with pt and pt wife in room to review d/c and inform on d/c tomorrow 6/30. Pt aware Outpatient therapy referral will resume with previous location [facility]. SW confirmed DME has been delivered to room.   [name], MSW, [place] Office: [phone] Cell: [phone] Fax: [phone]

## 2019-10-18 LAB — BASIC METABOLIC PANEL
Anion gap: 5 (ref 5–15)
BUN: 23 mg/dL (ref 8–23)
CO2: 27 mmol/L (ref 22–32)
Calcium: 8.3 mg/dL — ABNORMAL LOW (ref 8.9–10.3)
Chloride: 95 mmol/L — ABNORMAL LOW (ref 98–111)
Creatinine, Ser: 0.77 mg/dL (ref 0.61–1.24)
GFR calc Af Amer: >60 mL/min (ref >60)
GFR calc non Af Amer: >60 mL/min (ref >60)
Glucose, Bld: 164 mg/dL — ABNORMAL HIGH (ref 70–99)
Potassium: 4.9 mmol/L (ref 3.5–5.1)
Sodium: 127 mmol/L — ABNORMAL LOW (ref 135–145)

## 2019-10-18 LAB — GLUCOSE, CAPILLARY: Glucose-Capillary: 160 mg/dL — ABNORMAL HIGH (ref 70–99)

## 2019-10-18 NOTE — Progress Notes (Signed)
Inpatient Rehabilitation Care Coordinator  Discharge Note  The overall goal for the admission was met for:   Discharge location: Yes. D/c to home with wife who will be primary caregiver.   Length of Stay: Yes. 6 days.  Discharge activity level: Yes. Supervision.  Home/community participation: Yes. Limited.  Services provided included: MD, RD, PT, OT, SLP, RN, CM, TR, Pharmacy, Neuropsych and SW  Financial Services: Medicare and Private Insurance: [place]  Follow-up services arranged: Outpatient: [facility] for PT/OT/ST and DME: [facility] for shower chair and RW  Comments (or additional information): contact pt wife [name] [phone]  Patient/Family verbalized understanding of follow-up arrangements: Yes  Individual responsible for coordination of the follow-up plan: Pt to have assistance with coordinating care needs.   Confirmed correct DME delivered: Rana Snare 10/18/2019    Rana Snare

## 2019-10-18 NOTE — Plan of Care (Signed)
Pt d/c home today with wife with personal property, Dan provided discharge instructions and pt verbalized medications, d/c instructions, and follow up appts.

## 2019-10-18 NOTE — Progress Notes (Signed)
[name] [name]   Subjective/Complaints: Excited to be discharged today! Has no complaints. All medications and f/u appointments reviewed with patient prior to discharge.  Labs reviewed. Na and Ca low. Should be repeated on outpatient follow-up.   ROS: Patient denies fever, rash, sore throat, blurred vision, nausea, vomiting, diarrhea, cough, shortness of breath or chest pain, joint or back pain, headache, or mood change.     Objective:   No results found. No results for input(s): WBC, HGB, HCT, PLT in the last 72 hours. Recent Labs    10/18/19 0619  NA 127*  K 4.9  CL 95*  CO2 27  GLUCOSE 164*  BUN 23  CREATININE 0.77  CALCIUM 8.3*   No intake or output data in the 24 hours ending 10/18/19 1217   Physical Exam: Vital Signs Blood pressure 133/73, pulse 62, temperature 98.3 F (36.8 C), temperature source Oral, resp. rate 16, height 6\' 2"  (1.88 m), weight 94.7 kg, SpO2 95 %.  General: Alert and oriented x 3, No apparent distress HEENT: Head is normocephalic, atraumatic, PERRLA, EOMI, sclera anicteric, oral mucosa pink and moist, dentition intact, ext ear canals clear,  Neck: Supple without JVD or lymphadenopathy Heart: Reg rate and rhythm. No murmurs rubs or gallops Chest: CTA bilaterally without wheezes, rales, or rhonchi; no distress GI/Abdomen: BS +, non-tender, non-distended Ext: no clubbing, cyanosis, or edema Psych: pleasant and cooperative Neurological: good insight and awareness Motor: RUE: 5/5 proximal distal LUE: 4 to 4+/5 proximal distal, RLE: 4+/5 proximal distal LLE: 4+ to 5/5 proximal to distal  No focal sensory abnl. Mild left inattention? Skin: Skin is warm and dry.   Assessment/Plan: 1. 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  Physiatrist is providing close team supervision and 24 hour  management of active medical problems listed below.  Physiatrist and rehab team continue to assess barriers to discharge/monitor patient progress toward functional and medical goals  Care Tool:  Bathing    Body parts bathed by patient: Right arm, Left arm, Chest, Abdomen, Right upper leg, Left upper leg, Front perineal area, Buttocks, Face, Right lower leg, Left lower leg   Body parts bathed by helper: Right lower leg, Left lower leg     Bathing assist Assist Level: Supervision/Verbal cueing     Upper Body Dressing/Undressing Upper body dressing   What is the patient wearing?: Pull over shirt    Upper body assist Assist Level: Set up assist    Lower Body Dressing/Undressing Lower body dressing      What is the patient wearing?: Pants, Underwear/pull up     Lower body assist Assist for lower body dressing: Supervision/Verbal cueing     Toileting Toileting    Toileting assist Assist for toileting: Supervision/Verbal cueing     Transfers Chair/bed transfer  Transfers assist     Chair/bed transfer assist level: Independent with assistive device Chair/bed transfer assistive device: Museum/gallery exhibitions officer assist      Assist level: Supervision/Verbal cueing Assistive device: Walker-rolling Max distance: >300 ft   Walk 10 feet activity   Assist     Assist level: Supervision/Verbal cueing Assistive device: Walker-rolling   Walk 50 feet activity   Assist    Assist level: Supervision/Verbal cueing Assistive device: Walker-rolling    Walk 150 feet activity   Assist    Assist level: Supervision/Verbal cueing Assistive device: Walker-rolling  Walk 10 feet on uneven surface  activity   Assist     Assist level: Supervision/Verbal cueing Assistive device: Walker-rolling   Wheelchair     Assist Will patient use wheelchair at discharge?: No (patient is a functional ambulator) Type of Wheelchair:  Manual Wheelchair activity did not occur: N/A  Wheelchair assist level: Minimal Assistance - Patient > 75% Max wheelchair distance: 155 ft    Wheelchair 50 feet with 2 turns activity    Assist    Wheelchair 50 feet with 2 turns activity did not occur: N/A   Assist Level: Minimal Assistance - Patient > 75%   Wheelchair 150 feet activity     Assist  Wheelchair 150 feet activity did not occur: N/A   Assist Level: Minimal Assistance - Patient > 75%   Blood pressure 133/73, pulse 62, temperature 98.3 F (36.8 C), temperature source Oral, resp. rate 16, height 6\' 2"  (1.88 m), weight 94.7 kg, SpO2 95 %.  Medical Problem List and Plan: 1.  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  CONTINUE DECADRON 4 MG BID ON DISCHARGE. Plan outpatient MRI with medical oncology             -patient may shower             -ELOS/Goals: 9--13 days/supervision             -DC home today. F/u with Dr. Ranell Patrick in 1-2 weeks.   -Neuro-onc recommended dosing decadron at 4mg  bid at discharge. Reduced from QID to TID on 6/28 2.  Antithrombotics: -DVT/anticoagulation: Xarelto             -antiplatelet therapy: N/A 3. Pain Management: Tylenol as needed. Pain is well controlled.  4. Mood: 5 emotional support             -antipsychotic agents: N/A 5. Neuropsych: This patient is capable of making decisions on his own behalf. 6. Skin/Wound Care: Routine skin checks 7. Fluids/Electrolytes/Nutrition: Routine in and outs.      Na low at 131 on 6/24. Follow labs never done yesterday.   6/30: Na decreased to 127. Monitor at outpatient follow-up.  8.  Chronic diastolic congestive heart failure.  Monitor for any signs of fluid overload             Daily weights 9.  Diabetes mellitus with peripheral neuropathy.  Hemoglobin A1c 6.9.  Levemir 5 units  twice daily.  Monitor while on Decadron therapy  6/24: CBGs elevated. Increase Levemir to 6U BID.   6/25: CBGs elevated. Change diet to heart healthy/carb modified.    CBG (last 3)  Recent Labs    10/17/19 1731 10/17/19 2111 10/18/19 0606  GLUCAP 134* 157* 160*    6/25- 140s-240s- will increase Levemir to 8 units BID  6/27-29: CBGs showing improvement               -reducing steroids  as above   -still on levemir 8u bid  6/30: CBGs elevated but moderately well controlled. Continue steroid taper.  10.  Seizure prophylaxis.  Keppra 500 mg twice daily.  No seizure activity 11.  Hypertension.  Toprol-XL 25 mg daily, Norvasc 10 mg daily.    6/26- BP well controlled- con't regimen- pulse better 67 this AM  6/28-29  bp controlled, HR in 50-60's. No changes 12.  Hyperlipidemia.  Lipitor 13.  History of prostate  cancer with hematuria.  Monitor while on Xarelto 14.  CAD/CABG 2010.  Continue Xarelto.  No chest pain or shortness of breath 15. Hypocalcemia: started supplement. 17. Constipation  Had bm on 6/26 --did not receive mg citrate  6/29- add prn sorbitol to regimen and scheduled senna-s 18. Disposition: DC home with wife today. Patient should follow with Dr. Ranell Patrick in 1-2 weeks. Na must be repeated this appointment as trending downward. Patient is asymptomatic.   >30 minutes spent in discharge of patient including review of medications, follow-up appointments, and labs.      LOS: 7 days A FACE TO FACE EVALUATION WAS PERFORMED  [name] [name] 10/18/2019, 12:17 PM

## 2019-10-18 NOTE — Progress Notes (Signed)
Patient last recorded bowel movent was on the 26th. Offered patient sorbitol or miralax but patient declined. stated he would wait til he went home.

## 2019-10-18 NOTE — Progress Notes (Signed)
Patient admitted to [phone]. RN oriented to unit, rehab procedures, fall prevention plan, and rehab schedule. Pt. In bed with bed alarm on and all items within reach.  **late entry**

## 2019-10-20 ENCOUNTER — Ambulatory Visit: Payer: Medicare Other

## 2019-10-24 ENCOUNTER — Ambulatory Visit: Payer: Medicare Other

## 2019-10-24 ENCOUNTER — Ambulatory Visit: Payer: Medicare Other | Admitting: Occupational Therapy

## 2019-10-24 ENCOUNTER — Ambulatory Visit: Payer: Medicare Other | Attending: Neurosurgery | Admitting: Occupational Therapy

## 2019-10-24 ENCOUNTER — Other Ambulatory Visit: Payer: Self-pay

## 2019-10-24 DIAGNOSIS — R2689 Other abnormalities of gait and mobility: Secondary | ICD-10-CM

## 2019-10-24 DIAGNOSIS — R2681 Unsteadiness on feet: Secondary | ICD-10-CM | POA: Insufficient documentation

## 2019-10-24 DIAGNOSIS — R41842 Visuospatial deficit: Secondary | ICD-10-CM | POA: Diagnosis not present

## 2019-10-24 DIAGNOSIS — G9340 Encephalopathy, unspecified: Secondary | ICD-10-CM | POA: Insufficient documentation

## 2019-10-24 DIAGNOSIS — R278 Other lack of coordination: Secondary | ICD-10-CM | POA: Diagnosis not present

## 2019-10-24 DIAGNOSIS — R41844 Frontal lobe and executive function deficit: Secondary | ICD-10-CM

## 2019-10-24 DIAGNOSIS — R42 Dizziness and giddiness: Secondary | ICD-10-CM | POA: Diagnosis not present

## 2019-10-24 DIAGNOSIS — C719 Malignant neoplasm of brain, unspecified: Secondary | ICD-10-CM | POA: Insufficient documentation

## 2019-10-24 DIAGNOSIS — M6281 Muscle weakness (generalized): Secondary | ICD-10-CM

## 2019-10-24 DIAGNOSIS — R41840 Attention and concentration deficit: Secondary | ICD-10-CM | POA: Insufficient documentation

## 2019-10-24 DIAGNOSIS — H53462 Homonymous bilateral field defects, left side: Secondary | ICD-10-CM | POA: Diagnosis not present

## 2019-10-24 NOTE — Addendum Note (Signed)
Addended by: Kerrie Pleasure on: 10/24/2019 07:00 PM   Modules accepted: Orders

## 2019-10-24 NOTE — Therapy (Signed)
[facility] [address] [place], [place], [address] Phone: [phone]   Fax:  [phone]  Physical Therapy Evaluation  Patient Details  Name: [name] MRN: [id number] Date of Birth: 1945/09/22 Referring Provider (PT): [name]   Encounter Date: 10/24/2019   PT End of Session - 10/24/19 1858    Visit Number 1    Number of Visits 17    Date for PT Re-Evaluation 12/19/19    Progress Note Due on Visit 10    PT Start Time 1315    PT Stop Time 1400    PT Time Calculation (min) 45 min    Equipment Utilized During Treatment Gait belt    Activity Tolerance Patient tolerated treatment well    Behavior During Therapy [facility] for tasks assessed/performed           Past Medical History:  Diagnosis Date  . Anxiety   . Arthritis   . ASCVD (arteriosclerotic cardiovascular disease)    03/2004: DES x2 to the RCA; IMI with stent occlusion in 02/2005- suboptimal Plavix compliance  . CAD (coronary artery disease)   . Carcinoma of prostate ([place])    Clopidogrel held for biopsy  . Constipation due to pain medication   . Diabetes mellitus without complication ([place])   . Diabetic neuropathy ([place])   . Headache 2021  . Hyperlipidemia   . Hypertension   . Mild depression ([place])   . Morbid obesity ([place])   . Myocardial infarction ([place])   . Shortness of breath dyspnea   . Sleep apnea    no longer uses cpap  . Stroke ([place]) 2017  . Tobacco abuse    stopped smoking 06/28/09    Past Surgical History:  Procedure Laterality Date  . APPLICATION OF CRANIAL NAVIGATION N/A 07/04/2019   Procedure: APPLICATION OF CRANIAL NAVIGATION;  Surgeon: Ashok Pall, MD;  Location: [facility];  Service: Neurosurgery;  Laterality: N/A;  . BACK SURGERY    . CARDIAC CATHETERIZATION     X 1 stent before having CABG  . CORONARY ARTERY BYPASS GRAFT  06/28/2008   X4  . CRANIOTOMY Right 07/04/2019   Procedure: Right occipital craniotomy for tumor with  brainlab;  Surgeon: Ashok Pall, MD;  Location: [facility];  Service: Neurosurgery;  Laterality: Right;  . ENDARTERECTOMY Right 07/03/2015   Procedure: RIGHT CAROTID ENDARTERECTOMY WITH BOVINE PERICARDIUM PATCH ANGIOPLASTY;  Surgeon: Serafina Mitchell, MD;  Location: [facility];  Service: Vascular;  Laterality: Right;  . LUMBAR LAMINECTOMY/DECOMPRESSION MICRODISCECTOMY N/A 09/06/2014   Procedure: LUMBER DECOMPRESSION L3-5;  Surgeon: Melina Schools, MD;  Location: [facility];  Service: Orthopedics;  Laterality: N/A;  . LUMBAR SPINE SURGERY  2002  . PROSTATE BIOPSY    . PROSTATECTOMY  02/2007  . VASECTOMY      There were no vitals filed for this visit.    Subjective Assessment - 10/24/19 1840    Subjective Pt has history of glioblastoma is S/p Craniotomy on 07/04/19. he is currently receiving chemo. He was feeling sudden weakness in L side of body and in was in hospital from 10/08/19 to 10/18/19 due to subdural hematoma. Pt reports he has significantly improved since he returned back home but still feels very unsteady on his feet when using the cane. Pt reports of notciable weakness in left side of his body. Pt denies any new falls.    Pertinent History PMH: Rt occipital craniotomy 07/04/19 secondary to glioblastoma. PMH: Prostate CA, CVA 2017 (no residual deficits), CAD, DM,  HTN, hx of 2 lumbar surgeries 2002, 2016 (laminectomies)    Limitations Standing;Walking    How long can you sit comfortably? no issues    How long can you stand comfortably? 5 min    How long can you walk comfortably? 5 min    Patient Stated Goals walk better    Currently in Pain? No/denies              [facility] PT Assessment - 10/24/19 1348      Assessment   Medical Diagnosis S/p Occipital craniotomy/subdural hematoma    Hand Dominance Right      Prior Function   Level of Independence Independent    Vocation Retired    Leisure yardwork, Clinical research associate   Overall Cognitive Status Within Functional Limits for tasks  assessed    Area of Impairment Safety/judgement    Safety/Judgement Decreased awareness of safety    Attention Focused    Focused Attention Appears intact    Memory Appears intact    Awareness Appears intact      Ambulation/Gait   Ambulation/Gait Yes    Ambulation/Gait Assistance 6: Modified independent (Device/Increase time)    Ambulation Distance (Feet) 115 Feet    Assistive device Straight cane    Gait Pattern Decreased stance time - left;Decreased stride length;Decreased hip/knee flexion - left;Decreased dorsiflexion - left;Antalgic;Decreased trunk rotation;Wide base of support;Poor foot clearance - left    Ambulation Surface Level      Balance   Balance Assessed Yes      Standardized Balance Assessment   Standardized Balance Assessment 10 meter walk test    Five times sit to stand comments  pt unable to perform 5x sit to stand today   21 seconds (09/04/19)- shows regression   10 Meter Walk 30   30 sec with st. cane; 23 s with Rolling walker     Berg Balance Test   Sit to Stand Able to stand  independently using hands    Standing Unsupported Able to stand 2 minutes with supervision    Sitting with Back Unsupported but Feet Supported on Floor or Stool Able to sit safely and securely 2 minutes    Stand to Sit Sits safely with minimal use of hands    Transfers Able to transfer safely, minor use of hands    Standing Unsupported with Eyes Closed Able to stand 10 seconds with supervision    Standing Unsupported with Feet Together Able to place feet together independently and stand for 1 minute with supervision    From Standing, Reach Forward with Outstretched Arm Can reach forward >12 cm safely (5")    From Standing Position, Pick up Object from Floor Able to pick up shoe, needs supervision    From Standing Position, Turn to Look Behind Over each Shoulder Looks behind one side only/other side shows less weight shift    Turn 360 Degrees Able to turn 360 degrees safely but slowly   9    Standing Unsupported, Alternately Place Feet on Step/Stool Able to complete >2 steps/needs minimal assist    Standing Unsupported, One Foot in Front Able to take small step independently and hold 30 seconds    Standing on One Leg Tries to lift leg/unable to hold 3 seconds but remains standing independently    Total Score 39    Berg comment: Pt scored 47/56 on 09/04/19. New score indicates regression from previous baseline  Objective measurements completed on examination: See above findings.               PT Education - 10/24/19 1849    Education Details Pt educated on using RW primarily for indoor and outdoor ambulation due to decreased in his balance and confidence compared to previous assessment. discussed findings of today's session.    Person(s) Educated Patient;Spouse    Methods Explanation;Demonstration    Comprehension Verbalized understanding            PT Short Term Goals - 10/24/19 1850      PT SHORT TERM GOAL #1   Title Pt will be able to perform 5x sit to stand under 30 seconds to improve functional strengh    Baseline Pt unable to perform them (10/24/19)    Time 4    Period Weeks    Status New    Target Date 11/21/19      PT SHORT TERM GOAL #2   Title Patient will be able to ambulate for 15 min without rest with use of his walker to improve walking endurnace    Baseline <5 min (10/24/19)    Time 4    Period Weeks    Status New    Target Date 11/21/19      PT SHORT TERM GOAL #3   Title Pt will demo 4 points improvement on Berg balance scale to improve overall static balance    Baseline 37/56 (10/24/19)    Time 4    Period Weeks    Status New    Target Date 11/21/19             PT Long Term Goals - 10/24/19 1852      PT LONG TERM GOAL #1   Title Pt will demo 47/56 or better on BBS to improve balance and reduce fall risk    Baseline 37/26 (10/24/19)    Time 8    Period Weeks    Status New    Target Date 12/19/19        PT LONG TERM GOAL #2   Title Patient will be able to perform sit to stand in under 25 seconds to improve overall strength    Baseline unable (10/24/19)    Time 8    Period Weeks    Status New    Target Date 12/19/19      PT LONG TERM GOAL #3   Title Pt will be able to ambulate 100' without AD to improve household ambulation without AD    Baseline Pt uses cane in house (Eval)    Time 8    Period Weeks    Status New    Target Date 12/19/19      PT LONG TERM GOAL #4   Title Pt will demo improvement by 0.15 m/s with use of st. cane to improve functional gait velocity    Baseline 0.73m/s with cane, 0.40m/s with rolling walker (eval)    Time 8    Period Weeks    Status New    Target Date 12/19/19                  Plan - 10/24/19 1856    Clinical Impression Statement Patient is a 74 y.o. male who was seen today for physical therapy evaluation and treatment for gait and mobility disorder due to recent hospital admission due to subdural hematoma. Patient demonstrates significant decline in his functional strength and balane compared to previous assessment on 09/04/19.  Patient's berg balance scale score has declined from 47/26 to 37/56 which significantly increases his fall risk. Patient will benefit from skilled PT to address his strength and balance impairments to improve overall function.    Personal Factors and Comorbidities Comorbidity 3+    Comorbidities craniotomy secondary to glioblastoma, hx of lumbar surgeries x 2, CABG x 4    Examination-Activity Limitations Squat;Stairs;Locomotion Level;Caring for Others;Carry    Examination-Participation Restrictions Cleaning;Community Activity;Driving;Laundry;Yard Work;Shop    Stability/Clinical Decision Making Unstable/Unpredictable    Clinical Decision Making High    Rehab Potential Good    PT Frequency 2x / week    PT Duration 8 weeks    PT Treatment/Interventions ADLs/Self Care Home Management;Gait training;Stair  training;Functional mobility training;Therapeutic activities;Therapeutic exercise;Balance training;Neuromuscular re-education;Vestibular;Visual/perceptual remediation/compensation    PT Next Visit Plan Issue HEP           Patient will benefit from skilled therapeutic intervention in order to improve the following deficits and impairments:  Abnormal gait, Decreased activity tolerance, Decreased balance, Decreased mobility, Decreased endurance, Difficulty walking, Dizziness, Impaired vision/preception  Visit Diagnosis: Dizziness and giddiness  Unsteadiness on feet  Other abnormalities of gait and mobility  Encephalopathy acute     Problem List Patient Active Problem List   Diagnosis Date Noted  . 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 Dyslipidemia   . Seizure prophylaxis   . Chronic diastolic congestive heart failure ([place])   . Diabetic peripheral neuropathy ([place])   . Palliative care by specialist   . Weakness 10/08/2019  . Vasogenic edema ([place]) 10/08/2019  . Gross hematuria 10/08/2019  . DNR (do not resuscitate) discussion 07/13/2019  . S/P craniotomy 07/04/2019  . 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 Respiratory failure ([place])   . Encephalopathy acute   . Brain mass 07/01/2019  . Fever 07/01/2019  . Atherosclerosis of native artery of both lower extremities with intermittent claudication ([place]) 08/23/2018  . Coronary artery disease without angina pectoris 08/23/2018  . Dyspnea on exertion 08/23/2018  . Biochemically recurrent malignant neoplasm of prostate 08/16/2018  . S/P carotid endarterectomy 08/20/2015  . Essential hypertension 08/20/2015  . 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 HLD (hyperlipidemia) 08/20/2015  . Carotid artery stenosis, symptomatic 07/03/2015  . Carotid stenosis, right   . TIA (transient ischemic attack) 06/18/2015  . ASCVD (arteriosclerotic cardiovascular disease)  06/18/2015  . Diabetes mellitus without complication ([place]) 74/94/4967  . Resistant hypertension 06/18/2015  . Hyperlipidemia 06/18/2015  . Spinal stenosis of lumbar region with neurogenic claudication 09/06/2014  . Atherosclerosis of native artery of extremity with intermittent claudication ([place]) 03/09/2013  . HYPERLIPIDEMIA 04/08/2009  . TOBACCO ABUSE 04/08/2009  . ATHEROSCLEROTIC CARDIOVASCULAR DISEASE 04/08/2009  . INTERMITTENT VERTIGO 04/08/2009  . ADENOCARCINOMA, PROSTATE 03/21/2009  . ABDOMINAL PAIN -GENERALIZED 03/21/2009  . History of coronary artery bypass graft 06/28/2008    Kerrie Pleasure 10/24/2019, 6:59 PM  [facility] [address] [place], [place], [address] Phone: [phone]   Fax:  [phone]  Name: JAVARUS DORNER MRN: [id number] Date of Birth: 08/12/1945

## 2019-10-25 NOTE — Therapy (Addendum)
[facility] [address] [place], [place], [address] Phone: [phone]   Fax:  [phone]  Occupational Therapy Evaluation  Patient Details  Name: [name] MRN: [id number] Date of Birth: March 09, 1946 Referring Provider (OT): Dr. Bertis Ruddy   Encounter Date: 10/24/2019   OT End of Session - 10/25/19 0724    Visit Number 1    Number of Visits 25    Date for OT Re-Evaluation 01/23/20    Authorization Type MCR primary, AARP secondary    Authorization Time Period 90 days, may d/c after 8 weeks dep on progress.    Authorization - Visit Number 1    Authorization - Number of Visits 10    Progress Note Due on Visit 10    OT Start Time 4315    OT Stop Time 1443    OT Time Calculation (min) 38 min    Activity Tolerance Patient tolerated treatment well    Behavior During Therapy WFL for tasks assessed/performed           Past Medical History:  Diagnosis Date  . Anxiety   . Arthritis   . ASCVD (arteriosclerotic cardiovascular disease)    03/2004: DES x2 to the RCA; IMI with stent occlusion in 02/2005- suboptimal Plavix compliance  . CAD (coronary artery disease)   . Carcinoma of prostate ([place])    Clopidogrel held for biopsy  . Constipation due to pain medication   . Diabetes mellitus without complication ([place])   . Diabetic neuropathy ([place])   . Headache 2021  . Hyperlipidemia   . Hypertension   . Mild depression ([place])   . Morbid obesity ([place])   . Myocardial infarction ([place])   . Shortness of breath dyspnea   . Sleep apnea    no longer uses cpap  . Stroke ([place]) 2017  . Tobacco abuse    stopped smoking 06/28/09    Past Surgical History:  Procedure Laterality Date  . APPLICATION OF CRANIAL NAVIGATION N/A 07/04/2019   Procedure: APPLICATION OF CRANIAL NAVIGATION;  Surgeon: Ashok Pall, MD;  Location: [facility];  Service: Neurosurgery;  Laterality: N/A;  . BACK SURGERY    . CARDIAC CATHETERIZATION     X  1 stent before having CABG  . CORONARY ARTERY BYPASS GRAFT  06/28/2008   X4  . CRANIOTOMY Right 07/04/2019   Procedure: Right occipital craniotomy for tumor with brainlab;  Surgeon: Ashok Pall, MD;  Location: [facility];  Service: Neurosurgery;  Laterality: Right;  . ENDARTERECTOMY Right 07/03/2015   Procedure: RIGHT CAROTID ENDARTERECTOMY WITH BOVINE PERICARDIUM PATCH ANGIOPLASTY;  Surgeon: Serafina Mitchell, MD;  Location: [facility];  Service: Vascular;  Laterality: Right;  . LUMBAR LAMINECTOMY/DECOMPRESSION MICRODISCECTOMY N/A 09/06/2014   Procedure: LUMBER DECOMPRESSION L3-5;  Surgeon: Melina Schools, MD;  Location: [facility];  Service: Orthopedics;  Laterality: N/A;  . LUMBAR SPINE SURGERY  2002  . PROSTATE BIOPSY    . PROSTATECTOMY  02/2007  . VASECTOMY      There were no vitals filed for this visit.   Subjective Assessment - 10/25/19 0800    Subjective  Pt reports receiving therapies at [facility]    Pertinent History [name] is a 74 y.o. 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, prostate cancer with radiation therapy prostatectomy, CAD with CABG maintained on Xarelto, chronic diastolic congestive heart failure.  Lives with spouse  two-level home he had been receiving outpatient therapies.  Presented 10/08/2019 with increasing left-sided weakness headache nausea vomiting as well as bouts of hematuria.   CT of the head showed new severe vasogenic edema throughout the posterior right cerebral hemisphere with 8 mm leftNo evidence of acute intracranial abnormality.  Follow-up medical oncology maintained on Decadron therapy.    Patient Stated Goals maximize independence    Currently in Pain? No/denies             [facility] OT Assessment - 10/25/19 0001      Assessment   Medical Diagnosis s/p occipital craniotomy secondary to glioblastoma      Referring Provider (OT) Dr. Bertis Ruddy    Onset Date/Surgical Date 10/08/19    Hand Dominance Right    Next MD Visit oncologist 08/08/19      Precautions   Precaution Comments no driving      Balance Screen   Has the patient fallen in the past 6 months No    Has the patient had a decrease in activity level because of a fear of falling?  No      Home  Environment   Writer;Door    Additional Comments Pt lives w/ spouse in 2 story home with bedroom on 2nd floor. Pt has 2 steps to enter house.     Lives With Spouse      Prior Function   Level of Independence Independent    Vocation Retired;Part time employment   working in [employer] ([place])    Leisure yardwork, Office manager      ADL   Eating/Feeding Independent    Grooming Modified independent   extra time   Scientist, clinical (histocompatibility and immunogenetics) Supervision/safety    Lower Body Bathing Supervision/safety    Upper Body Dressing Supervision/safety   min v.c   Lower Body Dressing Minimal assistance    Toilet Transfer Independent;Supervision/safety    Toileting - Social research officer, government -  Hygiene Independent    Tub/Shower Transfer Supervision/safety      IADL   Shopping Needs to be accompanied on any shopping trip;Completely unable to shop    Light Housekeeping Needs help with all home maintenance tasks   vacuuming (wife does most laundry)   Meal Prep Does not utilize stove or Pensions consultant on family or friends for transportation    Medication Management Takes responsibility if medication is prepared in advance in seperate dosage   Wife oversees   Financial Management Requires assistance      Mobility   Mobility Status Independent      Written Expression   Handwriting --   denies change     Vision - History   Baseline Vision Wears glasses all the time      Vision Assessment   Ocular Range of Motion Within Functional Limits    Tracking/Visual Pursuits  Decreased smoothness of horizontal tracking    Visual Fields Left homonymous Hemianopsia   significant - to midline   Diplopia Assessment --   but bluriness in other ranges   Comment bumps into items on the left side, 12/80 items missed on 1.5 M number cancellation worksheet      Cognition   Overall Cognitive Status Cognition to be further assessed in functional context PRN    Area of Impairment Safety/judgement;Problem solving    Safety/Judgement Decreased awareness of safety    Awareness Impaired    Behaviors --   delayed processing,  pt reports decreased initation      Sensation   Additional Comments no changes, pt has had mild numbness in fingertips prior to this      Coordination   Gross Motor Movements are Fluid and Coordinated Yes    9 Hole Peg Test Right;Left    Right 9 Hole Peg Test 33 sec    Left 9 Hole Peg Test 58.25 secs      Edema   Edema none      ROM / Strength   AROM / PROM / Strength AROM;Strength      AROM   Overall AROM  Deficits    Overall AROM Comments RUE shoulder flexion 140, LUE shoulder flexion 120      Strength   Overall Strength Deficits    Overall Strength Comments RUE Grossly 4+/5, LUE grossly 4/5      Hand Function   Right Hand Grip (lbs) 41.4 lbs     Left Hand Grip (lbs) 38.3 lbs                             OT Short Term Goals - 10/25/19 0739      OT SHORT TERM GOAL #1   Title Pt to verbalize understanding of visual compensatory strategies to improve daily function including reading and environmental scanning    Time 4    Period Weeks    Status On-going      OT SHORT TERM GOAL #2   Title Pt to perform tabletop scanning with 90% or greater accuracy using strategies    Baseline 85% for 1.5 M number cancellation    Time 4    Period Weeks    Status New      OT SHORT TERM GOAL #3   Title Pt will read 3/4 page of text with accuracy, visual aids/AE and extra time PRN    Time 4    Period Weeks    Status New      OT  SHORT TERM GOAL #4   Title Pt will perform dressing with supervision/ set up    Baseline min A for shoes/ socks    Time 4    Period Weeks    Status New      OT SHORT TERM GOAL #5   Title Pt will demonstrate improved LUE fine motor coordination for ADLs as evidenced by decreasing 9 hole peg test score to 52 secs or less.    Baseline RUE 33 secs, LUE 58.25    Time 4    Period Weeks    Status New      Additional Short Term Goals   Additional Short Term Goals Yes      OT SHORT TERM GOAL #6   Title Pt will demonstrate ability to retrieve a llightweight object at 130 shoulder flexion with LUE demonstrating good control.    Baseline shoulder flexion RUE 140, LUE 120    Time 4    Period Weeks    Status New             OT Long Term Goals - 10/25/19 0747      OT LONG TERM GOAL #1   Title Pt will perform environmental scanning at 90% accuracy in mod distracting environment    Time 12    Period Weeks    Status New    Target Date 01/23/20      OT LONG TERM GOAL #2   Title  Pt to read full page of text or greater with visual aids/AE prn    Time 12    Period Weeks    Status New      OT LONG TERM GOAL #3   Title Pt will demonstrate improved fine motor coordination in LUE for ADLS as evidenced by decreasing 9 hole peg test score to 48 secs or less.    Baseline Pt will perform all basic ADLS with distant supervision.    Time 12    Period Weeks    Status New      OT LONG TERM GOAL #4   Title Pt will perform basic home management tasks with supervision demonstrating good safety awareness.    Time 12    Period Weeks    Status New                 Plan - 10/25/19 0732    Clinical Impression Statement [name] is a 74 y.o. 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, prostate cancer with radiation therapy  prostatectomy, CAD with CABG maintained on Xarelto, chronic diastolic congestive heart failure.  Lives with spouse two-level home he had been receiving outpatient therapies.  Presented 10/08/2019 with increasing left-sided weakness headache nausea vomiting as well as bouts of hematuria.   CT of the head showed new severe vasogenic edema throughout the posterior right cerebral hemisphere with 8 mm leftward midline shift.  No evidence of acute intracranial abnormality.  Follow-up medical oncology maintained on Decadron therapy.  Patient was admitted to [facility] and patient was d/c on 10/18/19. Pt presetns with the following deficits: cognitive deficits, left inattention/ visual field deficits, decreased balance, decreased coordination, decreased strength which impedes perfromance of ADLS/IADLS . Pt can benefit from skilled occupational therapy to address these deficits in order to maximize pt's safety and I with daily activities.    OT Occupational Profile and History Detailed Assessment- Review of Records and additional review of physical, cognitive, psychosocial history related to current functional performance    Occupational performance deficits (Please refer to evaluation for details): IADL's;Work;Leisure;Social Participation;ADL's    Body Structure / Function / Physical Skills IADL;Endurance;Balance;Vision;ADL;Mobility;Strength;UE functional use;FMC;Coordination;Gait;GMC;ROM;Decreased knowledge of use of DME;Dexterity    Cognitive Skills Memory;Problem Solve;Perception;Thought;Safety Awareness    Rehab Potential Good    Clinical Decision Making Several treatment options, min-mod task modification necessary    Comorbidities Affecting Occupational Performance: May have comorbidities impacting occupational performance    Modification or Assistance to Complete Evaluation  No modification of tasks or assist necessary to complete eval    OT Frequency 2x / week    OT Duration 12 weeks   may d/c after 8 weeks dep on  progress.   OT Treatment/Interventions Self-care/ADL training;Functional Furniture conservator/restorer;Therapeutic activities;Coping strategies training;Visual/perceptual remediation/compensation;Patient/family education;Cognitive remediation/compensation;Therapeutic exercise;Balance training;Manual Therapy;Neuromuscular education;Aquatic Therapy;Energy conservation;DME and/or AE instruction;Paraffin;Fluidtherapy;Gait Training;Passive range of motion;Moist Heat    Plan coordination HEP, environmental scanning / tabletop scanning    Consulted and Agree with Plan of Care Patient;Family member/caregiver    Family Member Consulted wife           Patient will benefit from skilled therapeutic intervention in order to improve the following deficits and impairments:   Body Structure / Function / Physical Skills: IADL, Endurance, Balance, Vision, ADL, Mobility, Strength, UE functional use, FMC, Coordination, Gait, GMC, ROM, Decreased knowledge of use of DME, Dexterity Cognitive Skills: Memory,  Problem Solve, Perception, Thought, Safety Awareness     Visit Diagnosis: Visuospatial deficit - Plan: Ot plan of care cert/re-cert  Attention and concentration deficit - Plan: Ot plan of care cert/re-cert  Homonymous hemianopsia, left - Plan: Ot plan of care cert/re-cert  Other abnormalities of gait and mobility - Plan: Ot plan of care cert/re-cert  Frontal lobe and executive function deficit - Plan: Ot plan of care cert/re-cert  Muscle weakness (generalized) - Plan: Ot plan of care cert/re-cert  Other lack of coordination - Plan: Ot plan of care cert/re-cert    Problem List Patient Active Problem List   Diagnosis Date Noted  . Glioblastoma multiforme ([place]) 10/11/2019  . Dyslipidemia   . Seizure prophylaxis   . Chronic diastolic congestive heart failure ([place])   . Diabetic peripheral neuropathy ([place])   . Palliative care by specialist   . Weakness 10/08/2019  . Vasogenic edema ([place]) 10/08/2019  . Gross  hematuria 10/08/2019  . DNR (do not resuscitate) discussion 07/13/2019  . S/P craniotomy 07/04/2019  . Glioblastoma with isocitrate dehydrogenase gene wildtype ([place]) 07/04/2019  . Respiratory failure ([place])   . Encephalopathy acute   . Brain mass 07/01/2019  . Fever 07/01/2019  . 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 Coronary artery disease without angina pectoris 08/23/2018  . Dyspnea on exertion 08/23/2018  . Biochemically recurrent malignant neoplasm of prostate 08/16/2018  . S/P carotid endarterectomy 08/20/2015  . Essential hypertension 08/20/2015  . Type 2 diabetes mellitus with circulatory disorder ([place]) 08/20/2015  . HLD (hyperlipidemia) 08/20/2015  . Carotid artery stenosis, symptomatic 07/03/2015  . Carotid stenosis, right   . TIA (transient ischemic attack) 06/18/2015  . ASCVD (arteriosclerotic cardiovascular disease) 06/18/2015  . Diabetes mellitus without complication ([place]) 01/60/1093  . Resistant hypertension 06/18/2015  . Hyperlipidemia 06/18/2015  . Spinal stenosis of lumbar region with neurogenic claudication 09/06/2014  . Atherosclerosis of native artery of extremity with intermittent claudication ([place]) 03/09/2013  . HYPERLIPIDEMIA 04/08/2009  . TOBACCO ABUSE 04/08/2009  . ATHEROSCLEROTIC CARDIOVASCULAR DISEASE 04/08/2009  . INTERMITTENT VERTIGO 04/08/2009  . ADENOCARCINOMA, PROSTATE 03/21/2009  . ABDOMINAL PAIN -GENERALIZED 03/21/2009  . History of coronary artery bypass graft 06/28/2008    Jessye Imhoff 10/25/2019, 10:55 AM Theone Murdoch, OTR/L [phone] Phone: [phone] 10:55 AM 10/25/19 [facility] [address] [place], [place], [address] Phone: [phone]   Fax:  [phone]  Name: [name] MRN: [id number] Date of Birth: 1946-03-23

## 2019-10-26 ENCOUNTER — Ambulatory Visit: Payer: Medicare Other | Admitting: Occupational Therapy

## 2019-10-26 ENCOUNTER — Encounter: Payer: Self-pay | Admitting: Occupational Therapy

## 2019-10-26 ENCOUNTER — Ambulatory Visit: Payer: Medicare Other

## 2019-10-26 ENCOUNTER — Other Ambulatory Visit: Payer: Self-pay

## 2019-10-26 DIAGNOSIS — R2689 Other abnormalities of gait and mobility: Secondary | ICD-10-CM

## 2019-10-26 DIAGNOSIS — R41842 Visuospatial deficit: Secondary | ICD-10-CM

## 2019-10-26 DIAGNOSIS — R41844 Frontal lobe and executive function deficit: Secondary | ICD-10-CM | POA: Diagnosis not present

## 2019-10-26 DIAGNOSIS — M6281 Muscle weakness (generalized): Secondary | ICD-10-CM

## 2019-10-26 DIAGNOSIS — H53462 Homonymous bilateral field defects, left side: Secondary | ICD-10-CM

## 2019-10-26 DIAGNOSIS — R2681 Unsteadiness on feet: Secondary | ICD-10-CM

## 2019-10-26 DIAGNOSIS — R42 Dizziness and giddiness: Secondary | ICD-10-CM

## 2019-10-26 DIAGNOSIS — R41840 Attention and concentration deficit: Secondary | ICD-10-CM

## 2019-10-26 NOTE — Therapy (Signed)
[facility] [address] [place], [place], [address] Phone: [phone]   Fax:  [phone]  Physical Therapy Treatment  Patient Details  Name: [name] MRN: [id number] Date of Birth: 1946/02/11 Referring Provider (PT): [name]   Encounter Date: 10/26/2019   PT End of Session - 10/26/19 1613    Visit Number 2    Number of Visits 17    Date for PT Re-Evaluation 12/19/19    Progress Note Due on Visit 10    PT Start Time 1530    PT Stop Time 1610    PT Time Calculation (min) 40 min    Equipment Utilized During Treatment Gait belt    Activity Tolerance Patient tolerated treatment well    Behavior During Therapy [facility] for tasks assessed/performed           Past Medical History:  Diagnosis Date  . Anxiety   . Arthritis   . ASCVD (arteriosclerotic cardiovascular disease)    03/2004: DES x2 to the RCA; IMI with stent occlusion in 02/2005- suboptimal Plavix compliance  . CAD (coronary artery disease)   . Carcinoma of prostate ([place])    Clopidogrel held for biopsy  . Constipation due to pain medication   . Diabetes mellitus without complication ([place])   . Diabetic neuropathy ([place])   . Headache 2021  . Hyperlipidemia   . Hypertension   . Mild depression ([place])   . Morbid obesity ([place])   . Myocardial infarction ([place])   . Shortness of breath dyspnea   . Sleep apnea    no longer uses cpap  . Stroke ([place]) 2017  . Tobacco abuse    stopped smoking 06/28/09    Past Surgical History:  Procedure Laterality Date  . APPLICATION OF CRANIAL NAVIGATION N/A 07/04/2019   Procedure: APPLICATION OF CRANIAL NAVIGATION;  Surgeon: Ashok Pall, MD;  Location: [facility];  Service: Neurosurgery;  Laterality: N/A;  . BACK SURGERY    . CARDIAC CATHETERIZATION     X 1 stent before having CABG  . CORONARY ARTERY BYPASS GRAFT  06/28/2008   X4  . CRANIOTOMY Right 07/04/2019   Procedure: Right occipital craniotomy for tumor with  brainlab;  Surgeon: Ashok Pall, MD;  Location: [facility];  Service: Neurosurgery;  Laterality: Right;  . ENDARTERECTOMY Right 07/03/2015   Procedure: RIGHT CAROTID ENDARTERECTOMY WITH BOVINE PERICARDIUM PATCH ANGIOPLASTY;  Surgeon: Serafina Mitchell, MD;  Location: [facility];  Service: Vascular;  Laterality: Right;  . LUMBAR LAMINECTOMY/DECOMPRESSION MICRODISCECTOMY N/A 09/06/2014   Procedure: LUMBER DECOMPRESSION L3-5;  Surgeon: Melina Schools, MD;  Location: [facility];  Service: Orthopedics;  Laterality: N/A;  . LUMBAR SPINE SURGERY  2002  . PROSTATE BIOPSY    . PROSTATECTOMY  02/2007  . VASECTOMY      There were no vitals filed for this visit.   Subjective Assessment - 10/26/19 1612    Subjective Pt reports he has started to use the walker more.    Patient is accompained by: Family member    Pertinent History PMH: Rt occipital craniotomy 07/04/19 secondary to glioblastoma. PMH: Prostate CA, CVA 2017 (no residual deficits), CAD, DM, HTN, hx of 2 lumbar surgeries 2002, 2016 (laminectomies)    Limitations Standing;Walking    How long can you sit comfortably? no issues    How long can you stand comfortably? 5 min    How long can you walk comfortably? 5 min    Patient Stated Goals walk better    Currently  in Pain? No/denies                Sit to stand from high mat table: 10x without UE support, cues for nose over toes and slow eccentric control when sitting down Pt educated on putting one hand on wlaker and one hand on chair when performing sit to stand Fwd step up: 6" box with box tap on 2nd step with contralateral foot: 10x R and L Gait training: 1 x 230' with RW, cues to not lean on walker handles, heel to toe gait pattern, and scanning for environment 10 feet ahead and when he has obstacles around him Fwd step over: 2" hurdle: 5x R and L, min A                       PT Short Term Goals - 10/24/19 1850      PT SHORT TERM GOAL #1   Title Pt will be able to perform 5x  sit to stand under 30 seconds to improve functional strengh    Baseline Pt unable to perform them (10/24/19)    Time 4    Period Weeks    Status New    Target Date 11/21/19      PT SHORT TERM GOAL #2   Title Patient will be able to ambulate for 15 min without rest with use of his walker to improve walking endurnace    Baseline <5 min (10/24/19)    Time 4    Period Weeks    Status New    Target Date 11/21/19      PT SHORT TERM GOAL #3   Title Pt will demo 4 points improvement on Berg balance scale to improve overall static balance    Baseline 37/56 (10/24/19)    Time 4    Period Weeks    Status New    Target Date 11/21/19             PT Long Term Goals - 10/24/19 1852      PT LONG TERM GOAL #1   Title Pt will demo 47/56 or better on BBS to improve balance and reduce fall risk    Baseline 37/26 (10/24/19)    Time 8    Period Weeks    Status New    Target Date 12/19/19      PT LONG TERM GOAL #2   Title Patient will be able to perform sit to stand in under 25 seconds to improve overall strength    Baseline unable (10/24/19)    Time 8    Period Weeks    Status New    Target Date 12/19/19      PT LONG TERM GOAL #3   Title Pt will be able to ambulate 100' without AD to improve household ambulation without AD    Baseline Pt uses cane in house (Eval)    Time 8    Period Weeks    Status New    Target Date 12/19/19      PT LONG TERM GOAL #4   Title Pt will demo improvement by 0.15 m/s with use of st. cane to improve functional gait velocity    Baseline 0.40m/s with cane, 0.6m/s with rolling walker (eval)    Time 8    Period Weeks    Status New    Target Date 12/19/19                 Plan - 10/26/19  1612    Clinical Impression Statement Today's session we focused on scanning environment ahead when walking and making turns, working on improving transfers and gait quality. Pt tends to have R flat foot contact and poor eccentric control which results in foot slap  during heel contact on R LE.    Personal Factors and Comorbidities Comorbidity 3+    Comorbidities craniotomy secondary to glioblastoma, hx of lumbar surgeries x 2, CABG x 4    Examination-Activity Limitations Squat;Stairs;Locomotion Level;Caring for Others;Carry    Examination-Participation Restrictions Cleaning;Community Activity;Driving;Laundry;Yard Work;Shop    Stability/Clinical Decision Making Unstable/Unpredictable    Rehab Potential Good    PT Frequency 2x / week    PT Duration 8 weeks    PT Treatment/Interventions ADLs/Self Care Home Management;Gait training;Stair training;Functional mobility training;Therapeutic activities;Therapeutic exercise;Balance training;Neuromuscular re-education;Vestibular;Visual/perceptual remediation/compensation    PT Next Visit Plan Issue HEP           Patient will benefit from skilled therapeutic intervention in order to improve the following deficits and impairments:  Abnormal gait, Decreased activity tolerance, Decreased balance, Decreased mobility, Decreased endurance, Difficulty walking, Dizziness, Impaired vision/preception  Visit Diagnosis: Unsteadiness on feet  Other abnormalities of gait and mobility  Muscle weakness (generalized)  Dizziness and giddiness     Problem List Patient Active Problem List   Diagnosis Date Noted  . 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 Dyslipidemia   . Seizure prophylaxis   . Chronic diastolic congestive heart failure ([place])   . Diabetic peripheral neuropathy ([place])   . Palliative care by specialist   . Weakness 10/08/2019  . Vasogenic edema ([place]) 10/08/2019  . Gross hematuria 10/08/2019  . DNR (do not resuscitate) discussion 07/13/2019  . S/P craniotomy 07/04/2019  . 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 Respiratory failure ([place])   . Encephalopathy acute   . Brain mass 07/01/2019  . Fever 07/01/2019  . Atherosclerosis of native artery of both lower  extremities with intermittent claudication ([place]) 08/23/2018  . Coronary artery disease without angina pectoris 08/23/2018  . Dyspnea on exertion 08/23/2018  . Biochemically recurrent malignant neoplasm of prostate 08/16/2018  . S/P carotid endarterectomy 08/20/2015  . Essential hypertension 08/20/2015  . Type 2 diabetes mellitus with circulatory disorder ([place]) 08/20/2015  . HLD (hyperlipidemia) 08/20/2015  . Carotid artery stenosis, symptomatic 07/03/2015  . Carotid stenosis, right   . TIA (transient ischemic attack) 06/18/2015  . ASCVD (arteriosclerotic cardiovascular disease) 06/18/2015  . Diabetes mellitus without complication ([place]) 40/98/1191  . Resistant hypertension 06/18/2015  . Hyperlipidemia 06/18/2015  . Spinal stenosis of lumbar region with neurogenic claudication 09/06/2014  . Atherosclerosis of native artery of extremity with intermittent claudication ([place]) 03/09/2013  . HYPERLIPIDEMIA 04/08/2009  . TOBACCO ABUSE 04/08/2009  . ATHEROSCLEROTIC CARDIOVASCULAR DISEASE 04/08/2009  . INTERMITTENT VERTIGO 04/08/2009  . ADENOCARCINOMA, PROSTATE 03/21/2009  . ABDOMINAL PAIN -GENERALIZED 03/21/2009  . History of coronary artery bypass graft 06/28/2008    Kerrie Pleasure 10/26/2019, 4:14 PM  [facility] [address] [place], [place], [address] Phone: [phone]   Fax:  [phone]  Name: [name] MRN: [id number] Date of Birth: 02-15-1946

## 2019-10-26 NOTE — Therapy (Signed)
[facility] [address] [place], [place], [address] Phone: [phone]   Fax:  [phone]  Occupational Therapy Treatment  Patient Details  Name: [name] MRN: [id number] Date of Birth: 1945/06/03 Referring Provider (OT): Dr. Bertis Ruddy   Encounter Date: 10/26/2019   OT End of Session - 10/26/19 1500    Visit Number 2    Number of Visits 25    Date for OT Re-Evaluation 01/23/20    Authorization Type MCR primary, AARP secondary    Authorization Time Period 90 days, may d/c after 8 weeks dep on progress.    Authorization - Visit Number 2    Authorization - Number of Visits 10    Progress Note Due on Visit 10    OT Start Time 1450    OT Stop Time 1528    OT Time Calculation (min) 38 min    Activity Tolerance Patient tolerated treatment well    Behavior During Therapy [facility] for tasks assessed/performed           Past Medical History:  Diagnosis Date  . Anxiety   . Arthritis   . ASCVD (arteriosclerotic cardiovascular disease)    03/2004: DES x2 to the RCA; IMI with stent occlusion in 02/2005- suboptimal Plavix compliance  . CAD (coronary artery disease)   . Carcinoma of prostate ([place])    Clopidogrel held for biopsy  . Constipation due to pain medication   . Diabetes mellitus without complication ([place])   . Diabetic neuropathy ([place])   . Headache 2021  . Hyperlipidemia   . Hypertension   . Mild depression ([place])   . Morbid obesity ([place])   . Myocardial infarction ([place])   . Shortness of breath dyspnea   . Sleep apnea    no longer uses cpap  . Stroke ([place]) 2017  . Tobacco abuse    stopped smoking 06/28/09    Past Surgical History:  Procedure Laterality Date  . APPLICATION OF CRANIAL NAVIGATION N/A 07/04/2019   Procedure: APPLICATION OF CRANIAL NAVIGATION;  Surgeon: Ashok Pall, MD;  Location: [facility];  Service: Neurosurgery;  Laterality: N/A;  . BACK SURGERY    . CARDIAC CATHETERIZATION     X 1  stent before having CABG  . CORONARY ARTERY BYPASS GRAFT  06/28/2008   X4  . CRANIOTOMY Right 07/04/2019   Procedure: Right occipital craniotomy for tumor with brainlab;  Surgeon: Ashok Pall, MD;  Location: [facility];  Service: Neurosurgery;  Laterality: Right;  . ENDARTERECTOMY Right 07/03/2015   Procedure: RIGHT CAROTID ENDARTERECTOMY WITH BOVINE PERICARDIUM PATCH ANGIOPLASTY;  Surgeon: Serafina Mitchell, MD;  Location: [facility];  Service: Vascular;  Laterality: Right;  . LUMBAR LAMINECTOMY/DECOMPRESSION MICRODISCECTOMY N/A 09/06/2014   Procedure: LUMBER DECOMPRESSION L3-5;  Surgeon: Melina Schools, MD;  Location: [facility];  Service: Orthopedics;  Laterality: N/A;  . LUMBAR SPINE SURGERY  2002  . PROSTATE BIOPSY    . PROSTATECTOMY  02/2007  . VASECTOMY      There were no vitals filed for this visit.   Subjective Assessment - 10/26/19 1459    Pertinent History [name] is a 74 y.o. 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, prostate cancer with radiation therapy prostatectomy, CAD with CABG maintained on Xarelto, chronic diastolic congestive heart failure.  Lives with spouse two-level home he had been receiving outpatient therapies.  Presented 10/08/2019  with increasing left-sided weakness headache nausea vomiting as well as bouts of hematuria.   CT of the head showed new severe vasogenic edema throughout the posterior right cerebral hemisphere with 8 mm leftNo evidence of acute intracranial abnormality.  Follow-up medical oncology maintained on Decadron therapy.    Patient Stated Goals maximize independence    Currently in Pain? No/denies                 Treatment:Placing medium pegs into pegboard with  while following verbal instruction for sequencing for a cognitive component, min difficulty/ v.c UBE x 5 mins level 3 for  conditioning.               OT Education - 10/26/19 1505    Education Details coordination HEP- see pt instructions    Person(s) Educated Patient;Spouse    Methods Explanation;Handout;Demonstration;Verbal cues    Comprehension Verbalized understanding;Returned demonstration;Verbal cues required            OT Short Term Goals - 10/25/19 0739      OT SHORT TERM GOAL #1   Title Pt to verbalize understanding of visual compensatory strategies to improve daily function including reading and environmental scanning    Time 4    Period Weeks    Status On-going      OT SHORT TERM GOAL #2   Title Pt to perform tabletop scanning with 90% or greater accuracy using strategies    Baseline 85% for 1.5 M number cancellation    Time 4    Period Weeks    Status New      OT SHORT TERM GOAL #3   Title Pt will read 3/4 page of text with accuracy, visual aids/AE and extra time PRN    Time 4    Period Weeks    Status New      OT SHORT TERM GOAL #4   Title Pt will perform dressing with supervision/ set up    Baseline min A for shoes/ socks    Time 4    Period Weeks    Status New      OT SHORT TERM GOAL #5   Title Pt will demonstrate improved LUE fine motor coordination for ADLs as evidenced by decreasing 9 hole peg test score to 52 secs or less.    Baseline RUE 33 secs, LUE 58.25    Time 4    Period Weeks    Status New      Additional Short Term Goals   Additional Short Term Goals Yes      OT SHORT TERM GOAL #6   Title Pt will demonstrate ability to retrieve a llightweight object at 130 shoulder flexion with LUE demonstrating good control.    Baseline shoulder flexion RUE 140, LUE 120    Time 4    Period Weeks    Status New             OT Long Term Goals - 10/25/19 0747      OT LONG TERM GOAL #1   Title Pt will perform environmental scanning at 90% accuracy in mod distracting environment    Time 12    Period Weeks    Status New    Target Date 01/23/20      OT  LONG TERM GOAL #2   Title Pt to read full page of text or greater with visual aids/AE prn    Time 12    Period Weeks    Status New  OT LONG TERM GOAL #3   Title Pt will demonstrate improved fine motor coordination in LUE for ADLS as evidenced by decreasing 9 hole peg test score to 48 secs or less.    Baseline Pt will perform all basic ADLS with distant supervision.    Time 12    Period Weeks    Status New      OT LONG TERM GOAL #4   Title Pt will perform basic home management tasks with supervision demonstrating good safety awareness.    Time 12    Period Weeks    Status New                 Plan - 10/26/19 1502    Clinical Impression Statement Pt is progressing towards goals. Pt demonstrates understanding of coordination HEP.    OT Occupational Profile and History Detailed Assessment- Review of Records and additional review of physical, cognitive, psychosocial history related to current functional performance    Occupational performance deficits (Please refer to evaluation for details): IADL's;Work;Leisure;Social Participation;ADL's    Body Structure / Function / Physical Skills IADL;Endurance;Balance;Vision;ADL;Mobility;Strength;UE functional use;FMC;Coordination;Gait;GMC;ROM;Decreased knowledge of use of DME;Dexterity    Cognitive Skills Memory;Problem Solve;Perception;Thought;Safety Awareness    Rehab Potential Good    Clinical Decision Making Several treatment options, min-mod task modification necessary    Comorbidities Affecting Occupational Performance: May have comorbidities impacting occupational performance    Modification or Assistance to Complete Evaluation  No modification of tasks or assist necessary to complete eval    OT Frequency 2x / week    OT Duration 12 weeks   may d/c after 8 weeks dep on progress.   OT Treatment/Interventions Self-care/ADL training;Functional Furniture conservator/restorer;Therapeutic activities;Coping strategies training;Visual/perceptual  remediation/compensation;Patient/family education;Cognitive remediation/compensation;Therapeutic exercise;Balance training;Manual Therapy;Neuromuscular education;Aquatic Therapy;Energy conservation;DME and/or AE instruction;Paraffin;Fluidtherapy;Gait Training;Passive range of motion;Moist Heat    Plan coordination HEP, environmental scanning / tabletop scanning    Consulted and Agree with Plan of Care Patient;Family member/caregiver    Family Member Consulted wife           Patient will benefit from skilled therapeutic intervention in order to improve the following deficits and impairments:   Body Structure / Function / Physical Skills: IADL, Endurance, Balance, Vision, ADL, Mobility, Strength, UE functional use, FMC, Coordination, Gait, GMC, ROM, Decreased knowledge of use of DME, Dexterity Cognitive Skills: Memory, Problem Solve, Perception, Thought, Safety Awareness     Visit Diagnosis: Unsteadiness on feet  Other abnormalities of gait and mobility  Visuospatial deficit  Attention and concentration deficit  Homonymous hemianopsia, left  Frontal lobe and executive function deficit  Muscle weakness (generalized)    Problem List Patient Active Problem List   Diagnosis Date Noted  . 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 Dyslipidemia   . Seizure prophylaxis   . Chronic diastolic congestive heart failure ([place])   . Diabetic peripheral neuropathy ([place])   . Palliative care by specialist   . Weakness 10/08/2019  . Vasogenic edema ([place]) 10/08/2019  . Gross hematuria 10/08/2019  . DNR (do not resuscitate) discussion 07/13/2019  . S/P craniotomy 07/04/2019  . 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 Respiratory failure ([place])   . Encephalopathy acute   . Brain mass 07/01/2019  . Fever 07/01/2019  . Atherosclerosis of native artery of both lower extremities with intermittent claudication ([place]) 08/23/2018  . Coronary artery disease  without angina pectoris 08/23/2018  . Dyspnea on exertion 08/23/2018  . Biochemically recurrent malignant neoplasm of prostate 08/16/2018  . S/P carotid  endarterectomy 08/20/2015  . Essential hypertension 08/20/2015  . Type 2 diabetes mellitus with circulatory disorder ([place]) 08/20/2015  . HLD (hyperlipidemia) 08/20/2015  . Carotid artery stenosis, symptomatic 07/03/2015  . Carotid stenosis, right   . TIA (transient ischemic attack) 06/18/2015  . ASCVD (arteriosclerotic cardiovascular disease) 06/18/2015  . Diabetes mellitus without complication ([place]) 78/97/8478  . Resistant hypertension 06/18/2015  . Hyperlipidemia 06/18/2015  . Spinal stenosis of lumbar region with neurogenic claudication 09/06/2014  . Atherosclerosis of native artery of extremity with intermittent claudication ([place]) 03/09/2013  . HYPERLIPIDEMIA 04/08/2009  . TOBACCO ABUSE 04/08/2009  . ATHEROSCLEROTIC CARDIOVASCULAR DISEASE 04/08/2009  . INTERMITTENT VERTIGO 04/08/2009  . ADENOCARCINOMA, PROSTATE 03/21/2009  . ABDOMINAL PAIN -GENERALIZED 03/21/2009  . History of coronary artery bypass graft 06/28/2008    Garrie Elenes 10/26/2019, 3:17 PM  [facility] [address] [place], [place], [address] Phone: [phone]   Fax:  [phone]  Name: JONCARLOS ATKISON MRN: [id number] Date of Birth: 01-10-1946

## 2019-10-26 NOTE — Patient Instructions (Signed)
  Coordination Activities  Perform the following activities for 20 minutes 1-2 times per day with left hand(s).   Rotate ball in fingertips (clockwise and counter-clockwise).  Toss ball between hands.  Toss ball in air and catch with the same hand.  Flip cards 1 at a time   Deal cards with your thumb (Hold deck in hand and push card off top with thumb).  Pick up coins and stack.  Pick up coins one at a time until you get 5-10 in your hand, then move coins from palm to fingertips to place  one at a time.

## 2019-10-27 ENCOUNTER — Ambulatory Visit
Admission: RE | Admit: 2019-10-27 | Discharge: 2019-10-27 | Disposition: A | Discharge status: Home / Self Care | Payer: Medicare Other | Source: Ambulatory Visit | Attending: Internal Medicine | Admitting: Internal Medicine

## 2019-10-27 DIAGNOSIS — C719 Malignant neoplasm of brain, unspecified: Secondary | ICD-10-CM

## 2019-10-27 MED ORDER — GADOBENATE DIMEGLUMINE 529 MG/ML IV SOLN
20.0000 mL | Freq: Once | INTRAVENOUS | Status: AC | PRN
  Administered 2019-10-27: 20 mL via INTRAVENOUS

## 2019-10-30 ENCOUNTER — Other Ambulatory Visit: Payer: Self-pay

## 2019-10-30 ENCOUNTER — Inpatient Hospital Stay: Payer: Medicare Other

## 2019-10-30 ENCOUNTER — Inpatient Hospital Stay: Payer: Medicare Other | Attending: Internal Medicine | Admitting: Internal Medicine

## 2019-10-30 VITALS — BP 148/67 | HR 68 | Temp 97.7°F | Resp 18 | Ht 74.0 in | Wt 210.4 lb

## 2019-10-30 DIAGNOSIS — Z7901 Long term (current) use of anticoagulants: Secondary | ICD-10-CM | POA: Insufficient documentation

## 2019-10-30 DIAGNOSIS — C719 Malignant neoplasm of brain, unspecified: Secondary | ICD-10-CM | POA: Diagnosis not present

## 2019-10-30 DIAGNOSIS — I2510 Atherosclerotic heart disease of native coronary artery without angina pectoris: Secondary | ICD-10-CM | POA: Diagnosis not present

## 2019-10-30 DIAGNOSIS — I252 Old myocardial infarction: Secondary | ICD-10-CM | POA: Diagnosis not present

## 2019-10-30 DIAGNOSIS — E119 Type 2 diabetes mellitus without complications: Secondary | ICD-10-CM | POA: Diagnosis not present

## 2019-10-30 DIAGNOSIS — Z8546 Personal history of malignant neoplasm of prostate: Secondary | ICD-10-CM | POA: Diagnosis not present

## 2019-10-30 DIAGNOSIS — Z9079 Acquired absence of other genital organ(s): Secondary | ICD-10-CM | POA: Diagnosis not present

## 2019-10-30 DIAGNOSIS — I10 Essential (primary) hypertension: Secondary | ICD-10-CM | POA: Diagnosis not present

## 2019-10-30 DIAGNOSIS — Z87891 Personal history of nicotine dependence: Secondary | ICD-10-CM | POA: Diagnosis not present

## 2019-10-30 DIAGNOSIS — C714 Malignant neoplasm of occipital lobe: Secondary | ICD-10-CM | POA: Diagnosis not present

## 2019-10-30 DIAGNOSIS — Z923 Personal history of irradiation: Secondary | ICD-10-CM | POA: Diagnosis not present

## 2019-10-30 DIAGNOSIS — Z79899 Other long term (current) drug therapy: Secondary | ICD-10-CM | POA: Diagnosis not present

## 2019-10-30 DIAGNOSIS — Z7902 Long term (current) use of antithrombotics/antiplatelets: Secondary | ICD-10-CM | POA: Insufficient documentation

## 2019-10-30 DIAGNOSIS — Z7952 Long term (current) use of systemic steroids: Secondary | ICD-10-CM | POA: Diagnosis not present

## 2019-10-30 DIAGNOSIS — E785 Hyperlipidemia, unspecified: Secondary | ICD-10-CM | POA: Insufficient documentation

## 2019-10-30 DIAGNOSIS — G4730 Sleep apnea, unspecified: Secondary | ICD-10-CM | POA: Diagnosis not present

## 2019-10-30 DIAGNOSIS — Z8673 Personal history of transient ischemic attack (TIA), and cerebral infarction without residual deficits: Secondary | ICD-10-CM | POA: Insufficient documentation

## 2019-10-30 MED ORDER — TEMOZOLOMIDE 180 MG PO CAPS: 180.0000 mg | ORAL_CAPSULE | Freq: Every day | ORAL | 0 refills | Status: DC

## 2019-10-30 MED ORDER — ONDANSETRON HCL 8 MG PO TABS: 8.0000 mg | ORAL_TABLET | Freq: Two times a day (BID) | ORAL | 1 refills | Status: DC | PRN

## 2019-10-30 MED ORDER — TEMOZOLOMIDE 140 MG PO CAPS: 140.0000 mg | ORAL_CAPSULE | Freq: Every day | ORAL | 0 refills | Status: DC

## 2019-10-30 MED FILL — ONDANSETRON HCL 8 MG TABLET: 8 | 15 days supply | Qty: 30 | Fill #0

## 2019-10-30 NOTE — Progress Notes (Signed)
[facility] [address] [place], [place] [address] [phone]   Interval Evaluation  Date of Service: 10/30/19 Patient Name: [name] Patient MRN: [id number] Patient DOB: [birth year] Provider: Ventura Sellers, MD  Identifying Statement:  [name] is a 74 y.o. male with right occipital glioblastoma    Referring Provider: Maurice Small, MD [facility] [address],  [place] [address]  Oncologic History: Oncology History  Glioblastoma with isocitrate dehydrogenase gene wildtype [facility])  07/04/2019 Surgery   Debulking resection by Dr. Christella Noa; path demonstrates Glioblastoma   08/17/2019 - 09/28/2019 Radiation Therapy   IMRT with concurrent daily Temozolomide '75mg'$ /m2     Biomarkers:  MGMT Unmethylated.  IDH 1/2 Wild type.  EGFR Not expressed  TERT Unknown   Interval History:  [name] presents to clinic today now having completed radiation and temozolomide.  Since discharge from rehab, no further seizures, no recurrence of confusion or left sided weakness.  He does acknowledge mild left arm weakness, but more significant weakness rising from seated position or climbing stairs.  This appears to affect both leg. Fatigue levels have been elevated.  He has been walking primarily with a cane assist. Overall his left sided vision is stable from prior.  Currently on decadron '4mg'$  twice per day since discharge.  H+P (07/13/19) Patient presented in early March 2021 with several days of progressive left sided visual impairment.  He noticed "missing things" in the left side of his field in his work and while driving.  He then developed headaches which were new for him.  Never complained of difficulty walking, speaking, using arms or hands.  CNS imaging demonstrated tumor within right occipital lobe, which was biopsied by Dr. Christella Noa on 07/04/19.  Following surgery, he has no new complaints or progressive deficits.  He  may be taking decadron '4mg'$  twice per day.  Otherwise his functional status is only limited by visual impairment. Worked at [employer] and lives with his wife of 38 years.  Medications: Current Outpatient Medications on File Prior to Visit  Medication Sig Dispense Refill  . acetaminophen (TYLENOL) 325 MG tablet Take 2 tablets (650 mg total) by mouth every 6 (six) hours as needed for mild pain (or Fever >/= 101). 30 tablet 0  . amLODipine (NORVASC) 10 MG tablet Take 1 tablet (10 mg total) by mouth every evening. 30 tablet 0  . atorvastatin (LIPITOR) 80 MG tablet Take 1 tablet (80 mg total) by mouth at bedtime. 30 tablet 0  . calcium citrate (CALCITRATE - DOSED IN MG ELEMENTAL CALCIUM) 950 (200 Ca) MG tablet Take 1 tablet (200 mg of elemental calcium total) by mouth daily. 30 tablet 0  . dexamethasone (DECADRON) 4 MG tablet Take 1 tablet (4 mg total) by mouth 2 (two) times daily. 120 tablet 0  . levETIRAcetam (KEPPRA) 500 MG tablet Take 1 tablet (500 mg total) by mouth 2 (two) times daily. 60 tablet 3  . metoprolol succinate (TOPROL-XL) 25 MG 24 hr tablet Take 0.5 tablets (12.5 mg total) by mouth every evening. 30 tablet 0  . polyethylene glycol (MIRALAX / GLYCOLAX) 17 g packet Take 17 g by mouth daily as needed (constipation). 14 each 0  . rivaroxaban (XARELTO) 20 MG TABS tablet Take 1 tablet (20 mg total) by mouth daily with supper. 30 tablet 0   No current facility-administered medications on file prior to visit.    Allergies:  Allergies  Allergen Reactions  .  Lisinopril Cough   Past Medical History:  Past Medical History:  Diagnosis Date  . Anxiety   . Arthritis   . ASCVD (arteriosclerotic cardiovascular disease)    03/2004: DES x2 to the RCA; IMI with stent occlusion in 02/2005- suboptimal Plavix compliance  . CAD (coronary artery disease)   . Carcinoma of prostate ([place])    Clopidogrel held for biopsy  . Constipation due to pain medication   . Diabetes mellitus without  complication ([place])   . Diabetic neuropathy ([place])   . Headache 2021  . Hyperlipidemia   . Hypertension   . Mild depression ([place])   . Morbid obesity ([place])   . Myocardial infarction ([place])   . Shortness of breath dyspnea   . Sleep apnea    no longer uses cpap  . Stroke ([place]) 2017  . Tobacco abuse    stopped smoking 06/28/09   Past Surgical History:  Past Surgical History:  Procedure Laterality Date  . APPLICATION OF CRANIAL NAVIGATION N/A 07/04/2019   Procedure: APPLICATION OF CRANIAL NAVIGATION;  Surgeon: Ashok Pall, MD;  Location: [facility];  Service: Neurosurgery;  Laterality: N/A;  . BACK SURGERY    . CARDIAC CATHETERIZATION     X 1 stent before having CABG  . CORONARY ARTERY BYPASS GRAFT  06/28/2008   X4  . CRANIOTOMY Right 07/04/2019   Procedure: Right occipital craniotomy for tumor with brainlab;  Surgeon: Ashok Pall, MD;  Location: [facility] [facility];  Service: Neurosurgery;  Laterality: Right;  . ENDARTERECTOMY Right 07/03/2015   Procedure: RIGHT CAROTID ENDARTERECTOMY WITH BOVINE PERICARDIUM PATCH ANGIOPLASTY;  Surgeon: Serafina Mitchell, MD;  Location: [facility];  Service: Vascular;  Laterality: Right;  . LUMBAR LAMINECTOMY/DECOMPRESSION MICRODISCECTOMY N/A 09/06/2014   Procedure: LUMBER DECOMPRESSION L3-5;  Surgeon: Melina Schools, MD;  Location: [facility];  Service: Orthopedics;  Laterality: N/A;  . LUMBAR SPINE SURGERY  2002  . PROSTATE BIOPSY    . PROSTATECTOMY  02/2007  . VASECTOMY     Social History:  Social History   Socioeconomic History  . Marital status: Married    Spouse name: Not on file  . Number of children: 4  . Years of education: Not on file  . Highest education level: Not on file  Occupational History  . Occupation: Technical brewer    Comment: retired  . Occupation: Designer, industrial/product: [employer]    Comment: full time  Tobacco Use  . Smoking status: Former Smoker    Packs/day: 1.00    Years: 40.00    Pack years: 40.00    Types: Cigarettes     Quit date: 06/28/2008    Years since quitting: 11.3  . Smokeless tobacco: Former Systems developer    Quit date: 06/28/2009  Vaping Use  . Vaping Use: Never used  Substance and Sexual Activity  . Alcohol use: Yes    Alcohol/week: 3.0 standard drinks    Types: 2 Glasses of wine, 1 Cans of beer per week    Comment: may have 1 drink a week  . Drug use: No  . Sexual activity: Not on file  Other Topics Concern  . Not on file  Social History Narrative   Married with 3 sons and 1 daughter   Daily caffeine use, 3 per day   Social Determinants of Health   Financial Resource Strain:   . Difficulty of Paying Living Expenses:   Food Insecurity:   . Worried About Charity fundraiser in the Last Year:   .  Ran Out of Food in the Last Year:   Transportation Needs:   . Film/video editor (Medical):   Marland Kitchen Lack of Transportation (Non-Medical):   Physical Activity:   . Days of Exercise per Week:   . Minutes of Exercise per Session:   Stress:   . Feeling of Stress :   Social Connections:   . Frequency of Communication with Friends and Family:   . Frequency of Social Gatherings with Friends and Family:   . Attends Religious Services:   . Active Member of Clubs or Organizations:   . Attends Archivist Meetings:   Marland Kitchen Marital Status:   Intimate Partner Violence:   . Fear of Current or Ex-Partner:   . Emotionally Abused:   Marland Kitchen Physically Abused:   . Sexually Abused:    Family History:  Family History  Problem Relation Age of Onset  . Kidney disease Mother   . Stroke Father   . Breast cancer Sister        breast progressed into bone  . Colon cancer Neg Hx   . Prostate cancer Neg Hx   . Pancreatic cancer Neg Hx     Review of Systems: Constitutional: Doesn't report fevers, chills or abnormal weight loss Eyes: Doesn't report blurriness of vision Ears, nose, mouth, throat, and face: Doesn't report sore throat Respiratory: Doesn't report cough, dyspnea or wheezes Cardiovascular: Doesn't  report palpitation, chest discomfort  Gastrointestinal:  Doesn't report nausea, constipation, diarrhea GU: Doesn't report incontinence Skin: Doesn't report skin rashes Neurological: Per HPI Musculoskeletal: Doesn't report joint pain Behavioral/Psych: +depression symptoms  Physical Exam: Vitals:   10/30/19 1033  BP: (!) 148/67  Pulse: 68  Resp: 18  Temp: 97.7 F (36.5 C)  SpO2: 98%   KPS: 80. General: Alert, cooperative, pleasant, in no acute distress Head: Normal EENT: No conjunctival injection or scleral icterus.  Lungs: Resp effort normal Cardiac: Regular rate Abdomen: Non-distended abdomen Skin: No rashes cyanosis or petechiae. Extremities: No clubbing or edema  Neurologic Exam: Mental Status: Awake, alert, attentive to examiner. Oriented to self and environment. Language is fluent with intact comprehension.  Cranial Nerves: Visual acuity is grossly normal. Left hemianopia. Extra-ocular movements intact. No ptosis. Face is symmetric Motor: Tone and bulk are normal. Pronator drift left arm, moderate hip girdle weakness bilaterally. Reflexes are symmetric, no pathologic reflexes present.  Sensory: Intact to light touch Gait: Dystaxic, walker assisted  Labs: I have reviewed the data as listed    Component Value Date/Time   NA 127 (L) 10/18/2019 0619   NA 139 05/04/2019 0954   K 4.9 10/18/2019 0619   CL 95 (L) 10/18/2019 0619   CO2 27 10/18/2019 0619   GLUCOSE 164 (H) 10/18/2019 0619   BUN 23 10/18/2019 0619   BUN 11 05/04/2019 0954   CREATININE 0.77 10/18/2019 0619   CREATININE 1.02 09/26/2019 0913   CALCIUM 8.3 (L) 10/18/2019 0619   PROT 5.7 (L) 10/12/2019 0515   PROT 6.8 05/04/2019 0954   ALBUMIN 3.1 (L) 10/12/2019 0515   ALBUMIN 4.5 05/04/2019 0954   AST 22 10/12/2019 0515   AST 15 09/26/2019 0913   ALT 36 10/12/2019 0515   ALT 19 09/26/2019 0913   ALKPHOS 61 10/12/2019 0515   BILITOT 1.1 10/12/2019 0515   BILITOT 1.4 (H) 09/26/2019 0913   GFRNONAA >60  10/18/2019 0619   GFRNONAA >60 09/26/2019 0913   GFRAA >60 10/18/2019 0619   GFRAA >60 09/26/2019 0913   Lab Results  Component Value Date  WBC 6.5 10/12/2019   NEUTROABS 5.9 10/12/2019   HGB 12.7 (L) 10/12/2019   HCT 37.1 (L) 10/12/2019   MCV 84.5 10/12/2019   PLT 145 (L) 10/12/2019    Imaging:  [facility] Clinician Interpretation: I have personally reviewed the CNS images as listed.  My interpretation, in the context of the patient's clinical presentation, is likely treatment effect  CT Head Wo Contrast  Result Date: 10/08/2019 CLINICAL DATA:  History of glioblastoma. Left arm weakness. Headache. Nausea. EXAM: CT HEAD WITHOUT CONTRAST TECHNIQUE: Contiguous axial images were obtained from the base of the skull through the vertex without intravenous contrast. COMPARISON:  Head MRI 07/22/2019 FINDINGS: Brain: Sequelae of right occipital craniotomy for glioblastoma debulking are again identified. There is minimal residual gas in the operative bed. There is new severe vasogenic edema extending from the right occipital lobe throughout the right temporal lobe, white matter tracts posterior to the basal ganglia, into the splenium of the corpus callosum, and into the right parietal lobe with regional sulcal effacement, subtotal effacement of the right lateral ventricle, and 8 mm of leftward midline shift. The left lateral ventricle is stable to at most minimally larger than on the prior MRI without evidence of frank hydrocephalus. Curvilinear hyperdensity along the superior aspect of and posterior to the third ventricle has density favoring calcification over hemorrhage, and no acute intracranial hemorrhage is evident elsewhere. There is an unchanged 1 cm dense calcification along the superomedial aspect of the left tentorium. No acute large territory cortically based infarct or extra-axial fluid collection is identified. Vascular: Calcified atherosclerosis at the skull base. Skull: Right occipital  craniotomy. Sinuses/Orbits: Paranasal sinuses and mastoid air cells are clear. Unremarkable orbits. Other: None. IMPRESSION: 1. New severe vasogenic edema throughout the posterior right cerebral hemisphere with 8 mm of leftward midline shift. 2. No evidence of acute intracranial abnormality. Electronically Signed   By: Logan Bores M.D.   On: 10/08/2019 20:20   MR BRAIN W WO CONTRAST  Result Date: 10/28/2019 CLINICAL DATA:  GBM follow-up EXAM: MRI HEAD WITHOUT AND WITH CONTRAST TECHNIQUE: Multiplanar, multiecho pulse sequences of the brain and surrounding structures were obtained without and with intravenous contrast. CONTRAST:  32m MULTIHANCE GADOBENATE DIMEGLUMINE 529 MG/ML IV SOLN COMPARISON:  07/22/2019 FINDINGS: Brain: Right occipital mass with features of necrosis and peripheral dense cellularity which extends from the cortex to right lateral ventricle. There has been an increase size of the enhancing area, on axial slices 6.8 x 4.1 cm today as compared to 2.8 x 1.7 cm previously. Significant increase in adjacent T2 hyperintensity with swelling. There is now midline shift which measures 6 mm. No entrapment or visible ependymal spread. No complicating infarct or acute hematoma. 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 Skull and upper cervical spine: Unremarkable craniotomy site Sinuses/Orbits: Negative IMPRESSION: 1. Right occipital GBM with prominent progression. Tumor related swelling now causes 6 mm of midline shift. 2. No interval progression of the right transverse dural sinus thrombosis. Electronically Signed   By: JMonte FantasiaM.D.   On: 10/28/2019 09:20    Assessment/Plan Glioblastoma with isocitrate dehydrogenase gene wildtype ([facility] [C71.9]    HLARRON ARMORis clinically stable today.  His MRI demonstrates expected pattern of localized inflammatory change which is consistent with  what was seen on the CT several weeks prior, but improved.  Brisk response to corticosteroids strongly suggests inflammatory etiology rather than rapid organic tumor progression. Today we extensively  discussed recent clinical changes, imaging and treatment plan options moving forward.  We ultimately recommended initiating treatment with Temozolomide '150mg'$ /m2, on for five days and off for twenty three days in twenty eight day cycles. The patient will have a complete blood count performed on days 21 and 28 of each cycle, and a comprehensive metabolic panel performed on day 28 of each cycle. Labs may need to be performed more often. Zofran will prescribed for home use for nausea/vomiting.  We reviewed side effects of Temodar including nausea/vomiting, fatigue, cytopenias, constipation.  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  We will call him in ~10 days to continue draw-down of corticosteroids, depending on his response to today's dose decrease.  Will con't Keppra '500mg'$  for seizure prevention.  He will return to in-person clinic in 4-5 weeks with labs for evaluation prior to cycle #2.  Next MRI brain can be in 2 months.  All questions were answered. The patient knows to call the clinic with any problems, questions or concerns. No barriers to learning were detected.  I have spent a total of 40 minutes of face-to-face and non-face-to-face time, excluding clinical staff time, preparing to see patient, ordering tests and/or medications, counseling the patient, and independently interpreting results and communicating results to the patient/family/caregiver     Ventura Sellers, MD Medical Director of Neuro-Oncology [facility] [place] 10/30/19 4:38 PM

## 2019-10-30 NOTE — Progress Notes (Signed)
DISCONTINUE ON PATHWAY REGIMEN - Neuro     One cycle, daily for 42 days concurrent with RT:     Temozolomide   **Always confirm dose/schedule in your pharmacy ordering system**  REASON: Continuation Of Treatment PRIOR TREATMENT: [id number]: Radiation Therapy with Concurrent Temozolomide 75 mg/m2 Daily x 6 Weeks, Followed by Sequential Temozolomide TREATMENT RESPONSE: N/A - Adjuvant Therapy  START ON PATHWAY REGIMEN - Neuro     A cycle is every 28 days:     Temozolomide      Temozolomide   **Always confirm dose/schedule in your pharmacy ordering system**  Patient Characteristics: Glioblastoma (Grade IV Glioma), Newly Diagnosed / Treatment Naive, Good Performance Status and/or Younger Patient, MGMT Promoter Unmethylated/Unknown Disease Classification: Glioma Disease Classification: Glioblastoma (Grade IV Glioma) Disease Status: Newly Diagnosed / Treatment Naive Performance Status: Good Performance Status and/or Younger Patient MGMT Promoter Methylation Status: Awaiting Test Results Intent of Therapy: Non-Curative / Palliative Intent, Discussed with Patient

## 2019-10-31 ENCOUNTER — Telehealth: Payer: Self-pay | Admitting: Internal Medicine

## 2019-10-31 ENCOUNTER — Ambulatory Visit: Payer: Medicare Other

## 2019-10-31 ENCOUNTER — Encounter: Payer: Medicare Other | Admitting: Occupational Therapy

## 2019-10-31 ENCOUNTER — Telehealth: Payer: Self-pay

## 2019-10-31 ENCOUNTER — Encounter: Payer: Self-pay | Admitting: Occupational Therapy

## 2019-10-31 ENCOUNTER — Telehealth: Payer: Self-pay | Admitting: Pharmacist

## 2019-10-31 ENCOUNTER — Ambulatory Visit: Payer: Medicare Other | Admitting: Occupational Therapy

## 2019-10-31 DIAGNOSIS — H53462 Homonymous bilateral field defects, left side: Secondary | ICD-10-CM | POA: Diagnosis not present

## 2019-10-31 DIAGNOSIS — R278 Other lack of coordination: Secondary | ICD-10-CM

## 2019-10-31 DIAGNOSIS — R2689 Other abnormalities of gait and mobility: Secondary | ICD-10-CM | POA: Diagnosis not present

## 2019-10-31 DIAGNOSIS — R41840 Attention and concentration deficit: Secondary | ICD-10-CM | POA: Diagnosis not present

## 2019-10-31 DIAGNOSIS — R41842 Visuospatial deficit: Secondary | ICD-10-CM | POA: Diagnosis not present

## 2019-10-31 DIAGNOSIS — M6281 Muscle weakness (generalized): Secondary | ICD-10-CM | POA: Diagnosis not present

## 2019-10-31 DIAGNOSIS — R41844 Frontal lobe and executive function deficit: Secondary | ICD-10-CM

## 2019-10-31 DIAGNOSIS — C719 Malignant neoplasm of brain, unspecified: Secondary | ICD-10-CM

## 2019-10-31 DIAGNOSIS — R42 Dizziness and giddiness: Secondary | ICD-10-CM

## 2019-10-31 DIAGNOSIS — R2681 Unsteadiness on feet: Secondary | ICD-10-CM

## 2019-10-31 DIAGNOSIS — G9340 Encephalopathy, unspecified: Secondary | ICD-10-CM

## 2019-10-31 NOTE — Telephone Encounter (Signed)
Scheduled per 7/12 los. Pt Is aware of appt times and dates.

## 2019-10-31 NOTE — Telephone Encounter (Signed)
Oral Oncology Patient Advocate Encounter  After completing a benefits investigation, prior authorization for Temodar is not required at this time through Medicare B.  Patient's copay is $17.46 fo140mg          $21.07 fo180mg   [facility] [place] Phone [phone] Fax [phone] 10/31/2019 9:14 AM

## 2019-10-31 NOTE — Telephone Encounter (Signed)
Scheduled per 7/12 los. Pt is aware of appt time and date. 

## 2019-10-31 NOTE — Therapy (Signed)
[facility] [address] [place], [place], [address] Phone: [phone]   Fax:  [phone]  Occupational Therapy Treatment  Patient Details  Name: [name] MRN: [id number] Date of Birth: [birth year] Referring Provider (OT): Dr. Bertis [name]   Encounter Date: 10/31/2019   OT End of Session - 10/31/19 1335    Visit Number 3    Number of Visits 25    Date for OT Re-Evaluation 01/23/20    Authorization Type MCR primary, AARP secondary    Authorization Time Period 90 days, may d/c after 8 weeks dep on progress.    Authorization - Visit Number 3    Authorization - Number of Visits 10    Progress Note Due on Visit 10    OT Start Time 1325    OT Stop Time 1405    OT Time Calculation (min) 40 min    Activity Tolerance Patient tolerated treatment well    Behavior During Therapy WFL for tasks assessed/performed           Past Medical History:  Diagnosis Date  . Anxiety   . Arthritis   . ASCVD (arteriosclerotic cardiovascular disease)    03/2004: DES x2 to the RCA; IMI with stent occlusion in 02/2005- suboptimal Plavix compliance  . CAD (coronary artery disease)   . Carcinoma of prostate ([place])    Clopidogrel held for biopsy  . Constipation due to pain medication   . Diabetes mellitus without complication ([place])   . Diabetic neuropathy ([place])   . Headache 2021  . Hyperlipidemia   . Hypertension   . Mild depression ([place])   . Morbid obesity ([place])   . Myocardial infarction ([place])   . Shortness of breath dyspnea   . Sleep apnea    no longer uses cpap  . Stroke ([place]) 2017  . Tobacco abuse    stopped smoking 06/28/09    Past Surgical History:  Procedure Laterality Date  . APPLICATION OF CRANIAL NAVIGATION N/A 07/04/2019   Procedure: APPLICATION OF CRANIAL NAVIGATION;  Surgeon: Mark Pall, MD;  Location: [facility];  Service: Neurosurgery;  Laterality: N/A;  . BACK SURGERY    . CARDIAC CATHETERIZATION     X  1 stent before having CABG  . CORONARY ARTERY BYPASS GRAFT  06/28/2008   X4  . CRANIOTOMY Right 07/04/2019   Procedure: Right occipital craniotomy for tumor with brainlab;  Surgeon: Mark Pall, MD;  Location: [facility];  Service: Neurosurgery;  Laterality: Right;  . ENDARTERECTOMY Right 07/03/2015   Procedure: RIGHT CAROTID ENDARTERECTOMY WITH BOVINE PERICARDIUM PATCH ANGIOPLASTY;  Surgeon: Mark Mitchell, MD;  Location: [facility];  Service: Vascular;  Laterality: Right;  . LUMBAR LAMINECTOMY/DECOMPRESSION MICRODISCECTOMY N/A 09/06/2014   Procedure: LUMBER DECOMPRESSION L3-5;  Surgeon: Mark Schools, MD;  Location: [facility];  Service: Orthopedics;  Laterality: N/A;  . LUMBAR SPINE SURGERY  2002  . PROSTATE BIOPSY    . PROSTATECTOMY  02/2007  . VASECTOMY      There were no vitals filed for this visit.   Subjective Assessment - 10/31/19 1329    Subjective  Feel ok, but not moving as well.  Will start oral chemo again soon    Pertinent History [name] is a 74 y.o. 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 Mickeal [name], prostate cancer with radiation therapy prostatectomy, CAD with CABG maintained on Xarelto, chronic  diastolic congestive heart failure.  Lives with spouse two-level home he had been receiving outpatient therapies.  Presented 10/08/2019 with increasing left-sided weakness headache nausea vomiting as well as bouts of hematuria.   CT of the head showed new severe vasogenic edema throughout the posterior right cerebral hemisphere with 8 mm leftNo evidence of acute intracranial abnormality.  Follow-up medical oncology maintained on Decadron therapy.    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 No/denies             Pt  ambulated from gym>RW with close supervision with min cueing for scanning to the L side and to walk into walker.   Completing 12 piece puzzle with mod cueing for visual-perceptual skills and min v.c. for incr use of LUE   Reviewed visual compensation strategies verbally.   Tabletop scanning to locate cards in sequential order (picking up/reaching for cards with LUE with min v.c.) and min cueing to utilize visual compensation strategies.          OT Short Term Goals - 10/25/19 0739      OT SHORT TERM GOAL #1   Title Pt to verbalize understanding of visual compensatory strategies to improve daily function including reading and environmental scanning    Time 4    Period Weeks    Status On-going      OT SHORT TERM GOAL #2   Title Pt to perform tabletop scanning with 90% or greater accuracy using strategies    Baseline 85% for 1.5 M number cancellation    Time 4    Period Weeks    Status New      OT SHORT TERM GOAL #3   Title Pt will read 3/4 page of text with accuracy, visual aids/AE and extra time PRN    Time 4    Period Weeks    Status New      OT SHORT TERM GOAL #4   Title Pt will perform dressing with supervision/ set up    Baseline min A for shoes/ socks    Time 4    Period Weeks    Status New      OT SHORT TERM GOAL #5   Title Pt will demonstrate improved LUE fine motor coordination for ADLs as evidenced by decreasing 9 hole peg test score to 52 secs or less.    Baseline RUE 33 secs, LUE 58.25    Time 4    Period Weeks    Status New      Additional Short Term Goals   Additional Short Term Goals Yes      OT SHORT TERM GOAL #6   Title Pt will demonstrate ability to retrieve a llightweight object at 130 shoulder flexion with LUE demonstrating good control.    Baseline shoulder flexion RUE 140, LUE 120    Time 4    Period Weeks    Status New             OT Long Term Goals - 10/25/19 0747      OT LONG TERM GOAL #1   Title Pt will perform environmental  scanning at 90% accuracy in mod distracting environment    Time 12    Period Weeks    Status New    Target Date 01/23/20      OT LONG TERM GOAL #2   Title Pt  to read full page of text or greater with visual aids/AE prn    Time 12    Period Weeks    Status New      OT LONG TERM GOAL #3   Title Pt will demonstrate improved fine motor coordination in LUE for ADLS as evidenced by decreasing 9 hole peg test score to 48 secs or less.    Baseline Pt will perform all basic ADLS with distant supervision.    Time 12    Period Weeks    Status New      OT LONG TERM GOAL #4   Title Pt will perform basic home management tasks with supervision demonstrating good safety awareness.    Time 12    Period Weeks    Status New                 Plan - 10/31/19 1355    Clinical Impression Statement Pt is progressing towards goals. Pt able to verbalize compensation strategies for visual deficits with min cueing and min cueing to utilize strategies.    OT Occupational Profile and History Detailed Assessment- Review of Records and additional review of physical, cognitive, psychosocial history related to current functional performance    Occupational performance deficits (Please refer to evaluation for details): IADL's;Work;Leisure;Social Participation;ADL's    Body Structure / Function / Physical Skills IADL;Endurance;Balance;Vision;ADL;Mobility;Strength;UE functional use;FMC;Coordination;Gait;GMC;ROM;Decreased knowledge of use of DME;Dexterity    Cognitive Skills Memory;Problem Solve;Perception;Thought;Safety Awareness    Rehab Potential Good    Clinical Decision Making Several treatment options, min-mod task modification necessary    Comorbidities Affecting Occupational Performance: May have comorbidities impacting occupational performance    Modification or Assistance to Complete Evaluation  No modification of tasks or assist necessary to complete eval    OT Frequency 2x / week    OT Duration 12  weeks   may d/c after 8 weeks dep on progress.   OT Treatment/Interventions Self-care/ADL training;Functional Furniture conservator/restorer;Therapeutic activities;Coping strategies training;Visual/perceptual remediation/compensation;Patient/family education;Cognitive remediation/compensation;Therapeutic exercise;Balance training;Manual Therapy;Neuromuscular education;Aquatic Therapy;Energy conservation;DME and/or AE instruction;Paraffin;Fluidtherapy;Gait Training;Passive range of motion;Moist Heat    Plan environmental scanning, continue with coordination    Consulted and Agree with Plan of Care Patient;Family member/caregiver    Family Member Consulted wife           Patient will benefit from skilled therapeutic intervention in order to improve the following deficits and impairments:   Body Structure / Function / Physical Skills: IADL, Endurance, Balance, Vision, ADL, Mobility, Strength, UE functional use, FMC, Coordination, Gait, GMC, ROM, Decreased knowledge of use of DME, Dexterity Cognitive Skills: Memory, Problem Solve, Perception, Thought, Safety Awareness     Visit Diagnosis: Visuospatial deficit  Frontal lobe and executive function deficit  Other lack of coordination  Muscle weakness (generalized)    Problem List Patient Active Problem List   Diagnosis Date Noted  . 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 Dyslipidemia   . Seizure prophylaxis   . Chronic diastolic congestive heart failure ([place])   . Diabetic peripheral neuropathy ([place])   . Palliative care by specialist   . Weakness 10/08/2019  . Vasogenic edema ([place]) 10/08/2019  . Gross hematuria 10/08/2019  . DNR (do not resuscitate) discussion 07/13/2019  . S/P craniotomy 07/04/2019  . 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 Respiratory failure ([place])   . Encephalopathy acute   . Brain mass 07/01/2019  . Fever 07/01/2019  . Atherosclerosis of native artery of both lower extremities  with intermittent claudication ([place]) 08/23/2018  .  Coronary artery disease without angina pectoris 08/23/2018  . Dyspnea on exertion 08/23/2018  . Biochemically recurrent malignant neoplasm of prostate 08/16/2018  . S/P carotid endarterectomy 08/20/2015  . Essential hypertension 08/20/2015  . Type 2 diabetes mellitus with circulatory disorder ([facility]) 08/20/2015  . HLD (hyperlipidemia) 08/20/2015  . Carotid artery stenosis, symptomatic 07/03/2015  . Carotid stenosis, right   . TIA (transient ischemic attack) 06/18/2015  . ASCVD (arteriosclerotic cardiovascular disease) 06/18/2015  . Diabetes mellitus without complication ([place]) 14/48/1856  . Resistant hypertension 06/18/2015  . Hyperlipidemia 06/18/2015  . Spinal stenosis of lumbar region with neurogenic claudication 09/06/2014  . Atherosclerosis of native artery of extremity with intermittent claudication ([place]) 03/09/2013  . HYPERLIPIDEMIA 04/08/2009  . TOBACCO ABUSE 04/08/2009  . ATHEROSCLEROTIC CARDIOVASCULAR DISEASE 04/08/2009  . INTERMITTENT VERTIGO 04/08/2009  . ADENOCARCINOMA, PROSTATE 03/21/2009  . ABDOMINAL PAIN -GENERALIZED 03/21/2009  . History of coronary artery bypass graft 06/28/2008    [facility] 10/31/2019, 2:26 PM  [facility] [address] [place], [place], [address] Phone: [phone]   Fax:  [phone]  Name: IHSAN NOMURA MRN: [id number] Date of Birth: 09-30-1945  Vianne Bulls, OTR/L [facility] [address] [place], [place]  [address] [phone] phone [phone] 10/31/19 2:26 PM

## 2019-10-31 NOTE — Telephone Encounter (Signed)
Oral Chemotherapy Pharmacist Encounter  Patient's wife plan on picking up the Temodar from [facility] tomorrow 11/01/19.  Patient Education I spoke with patient's wife [name] for overview of Temodar (temozolomide). Patient has previously used temozolomide along with his XRT treatment.  Counseled patient on administration, dosing, side effects, monitoring. Patient will take one 140mg  capsule and one 180mg  capsule by mouth daily. May take on an empty stomach to decrease nausea & vomiting.  [name] voiced understanding and appreciation. All questions answered.  Provided patient with [facility] phone number. Patient knows to call the office with questions or concerns. Oral Chemotherapy [facility] will continue to follow.  Darl Pikes, PharmD, BCPS, BCOP, CPP Hematology/Oncology Clinical Pharmacist Practitioner [facility]/HP/AP [facility] [phone]  10/31/2019 1:59 PM

## 2019-10-31 NOTE — Therapy (Signed)
[facility] [address] [place], [place], [address] Phone: [phone]   Fax:  [phone]  Physical Therapy Treatment  Patient Details  Name: [name] MRN: [id number] Date of Birth: 1946-01-03 Referring Provider (PT): [name]   Encounter Date: 10/31/2019   PT End of Session - 10/31/19 1433    Visit Number 3    Number of Visits 17    Date for PT Re-Evaluation 12/19/19    Progress Note Due on Visit 10    PT Start Time 1400    PT Stop Time 1445    PT Time Calculation (min) 45 min    Equipment Utilized During Treatment Gait belt    Activity Tolerance Patient tolerated treatment well    Behavior During Therapy [facility] for tasks assessed/performed           Past Medical History:  Diagnosis Date  . Anxiety   . Arthritis   . ASCVD (arteriosclerotic cardiovascular disease)    03/2004: DES x2 to the RCA; IMI with stent occlusion in 02/2005- suboptimal Plavix compliance  . CAD (coronary artery disease)   . Carcinoma of prostate ([place])    Clopidogrel held for biopsy  . Constipation due to pain medication   . Diabetes mellitus without complication ([place])   . Diabetic neuropathy ([place])   . Headache 2021  . Hyperlipidemia   . Hypertension   . Mild depression ([place])   . Morbid obesity ([place])   . Myocardial infarction ([place])   . Shortness of breath dyspnea   . Sleep apnea    no longer uses cpap  . Stroke ([place]) 2017  . Tobacco abuse    stopped smoking 06/28/09    Past Surgical History:  Procedure Laterality Date  . APPLICATION OF CRANIAL NAVIGATION N/A 07/04/2019   Procedure: APPLICATION OF CRANIAL NAVIGATION;  Surgeon: Ashok Pall, MD;  Location: [facility];  Service: Neurosurgery;  Laterality: N/A;  . BACK SURGERY    . CARDIAC CATHETERIZATION     X 1 stent before having CABG  . CORONARY ARTERY BYPASS GRAFT  06/28/2008   X4  . CRANIOTOMY Right 07/04/2019   Procedure: Right occipital craniotomy for tumor with  brainlab;  Surgeon: Ashok Pall, MD;  Location: [facility];  Service: Neurosurgery;  Laterality: Right;  . ENDARTERECTOMY Right 07/03/2015   Procedure: RIGHT CAROTID ENDARTERECTOMY WITH BOVINE PERICARDIUM PATCH ANGIOPLASTY;  Surgeon: Serafina Mitchell, MD;  Location: [facility];  Service: Vascular;  Laterality: Right;  . LUMBAR LAMINECTOMY/DECOMPRESSION MICRODISCECTOMY N/A 09/06/2014   Procedure: LUMBER DECOMPRESSION L3-5;  Surgeon: Melina Schools, MD;  Location: [facility];  Service: Orthopedics;  Laterality: N/A;  . LUMBAR SPINE SURGERY  2002  . PROSTATE BIOPSY    . PROSTATECTOMY  02/2007  . VASECTOMY      There were no vitals filed for this visit.   Subjective Assessment - 10/31/19 1423    Subjective Pt reports he is using walker and cane more. No new falls.    Patient is accompained by: Family member    Pertinent History PMH: Rt occipital craniotomy 07/04/19 secondary to glioblastoma. PMH: Prostate CA, CVA 2017 (no residual deficits), CAD, DM, HTN, hx of 2 lumbar surgeries 2002, 2016 (laminectomies)    Limitations Standing;Walking    How long can you sit comfortably? no issues    How long can you stand comfortably? 5 min    How long can you walk comfortably? 5 min    Patient Stated Goals walk better  Treatment: Sit to stand from elevated mat table; min A from PT, no HHA on mat table or walker: 10x, cues to lean "nose over toes", increase WB through L LE: 2 x 5 Standing wide BOS: EC: 3 x 30" Sit to stand with mat table from elevated table: 5x using walker SBA Sit to stand from standard chair with bilUE use: CGA: 3x Sanding taking a step fwd and back without UE support: tactile cues to improve weight shift to L LE: 2 x 5 R nad L                         PT Short Term Goals - 10/24/19 1850      PT SHORT TERM GOAL #1   Title Pt will be able to perform 5x sit to stand under 30 seconds to improve functional strengh    Baseline Pt unable to perform them (10/24/19)     Time 4    Period Weeks    Status New    Target Date 11/21/19      PT SHORT TERM GOAL #2   Title Patient will be able to ambulate for 15 min without rest with use of his walker to improve walking endurnace    Baseline <5 min (10/24/19)    Time 4    Period Weeks    Status New    Target Date 11/21/19      PT SHORT TERM GOAL #3   Title Pt will demo 4 points improvement on Berg balance scale to improve overall static balance    Baseline 37/56 (10/24/19)    Time 4    Period Weeks    Status New    Target Date 11/21/19             PT Long Term Goals - 10/24/19 1852      PT LONG TERM GOAL #1   Title Pt will demo 47/56 or better on BBS to improve balance and reduce fall risk    Baseline 37/26 (10/24/19)    Time 8    Period Weeks    Status New    Target Date 12/19/19      PT LONG TERM GOAL #2   Title Patient will be able to perform sit to stand in under 25 seconds to improve overall strength    Baseline unable (10/24/19)    Time 8    Period Weeks    Status New    Target Date 12/19/19      PT LONG TERM GOAL #3   Title Pt will be able to ambulate 100' without AD to improve household ambulation without AD    Baseline Pt uses cane in house (Eval)    Time 8    Period Weeks    Status New    Target Date 12/19/19      PT LONG TERM GOAL #4   Title Pt will demo improvement by 0.15 m/s with use of st. cane to improve functional gait velocity    Baseline 0.65m/s with cane, 0.65m/s with rolling walker (eval)    Time 8    Period Weeks    Status New    Target Date 12/19/19                 Plan - 10/31/19 1423    Clinical Impression Statement Today's session was focused on improving sit to stand transfers and walking.    Personal Factors and Comorbidities Comorbidity 3+  Comorbidities craniotomy secondary to glioblastoma, hx of lumbar surgeries x 2, CABG x 4    Examination-Activity Limitations Squat;Stairs;Locomotion Level;Caring for Others;Carry     Examination-Participation Restrictions Cleaning;Community Activity;Driving;Laundry;Yard Work;Shop    Stability/Clinical Decision Making Unstable/Unpredictable    Rehab Potential Good    PT Frequency 2x / week    PT Duration 8 weeks    PT Treatment/Interventions ADLs/Self Care Home Management;Gait training;Stair training;Functional mobility training;Therapeutic activities;Therapeutic exercise;Balance training;Neuromuscular re-education;Vestibular;Visual/perceptual remediation/compensation    PT Next Visit Plan Issue HEP           Patient will benefit from skilled therapeutic intervention in order to improve the following deficits and impairments:  Abnormal gait, Decreased activity tolerance, Decreased balance, Decreased mobility, Decreased endurance, Difficulty walking, Dizziness, Impaired vision/preception  Visit Diagnosis: Muscle weakness (generalized)  Unsteadiness on feet  Other abnormalities of gait and mobility  Dizziness and giddiness  Glioblastoma multiforme (HCC)  Encephalopathy acute     Problem List Patient Active Problem List   Diagnosis Date Noted  . Glioblastoma multiforme ([place]) 10/11/2019  . Dyslipidemia   . Seizure prophylaxis   . Chronic diastolic congestive heart failure ([place])   . Diabetic peripheral neuropathy ([place])   . Palliative care by specialist   . Weakness 10/08/2019  . Vasogenic edema ([place]) 10/08/2019  . Gross hematuria 10/08/2019  . DNR (do not resuscitate) discussion 07/13/2019  . S/P craniotomy 07/04/2019  . Glioblastoma with isocitrate dehydrogenase gene wildtype ([place]) 07/04/2019  . Respiratory failure ([place])   . Encephalopathy acute   . Brain mass 07/01/2019  . Fever 07/01/2019  . Atherosclerosis of native artery of both lower extremities with intermittent claudication ([place]) 08/23/2018  . Coronary artery disease without angina pectoris 08/23/2018  . Dyspnea on exertion 08/23/2018  . Biochemically recurrent malignant neoplasm of prostate  08/16/2018  . S/P carotid endarterectomy 08/20/2015  . Essential hypertension 08/20/2015  . Type 2 diabetes mellitus with circulatory disorder ([place]) 08/20/2015  . HLD (hyperlipidemia) 08/20/2015  . Carotid artery stenosis, symptomatic 07/03/2015  . Carotid stenosis, right   . TIA (transient ischemic attack) 06/18/2015  . ASCVD (arteriosclerotic cardiovascular disease) 06/18/2015  . Diabetes mellitus without complication ([place]) 75/17/0017  . Resistant hypertension 06/18/2015  . Hyperlipidemia 06/18/2015  . Spinal stenosis of lumbar region with neurogenic claudication 09/06/2014  . Atherosclerosis of native artery of extremity with intermittent claudication ([place]) 03/09/2013  . HYPERLIPIDEMIA 04/08/2009  . TOBACCO ABUSE 04/08/2009  . ATHEROSCLEROTIC CARDIOVASCULAR DISEASE 04/08/2009  . INTERMITTENT VERTIGO 04/08/2009  . ADENOCARCINOMA, PROSTATE 03/21/2009  . ABDOMINAL PAIN -GENERALIZED 03/21/2009  . History of coronary artery bypass graft 06/28/2008    Kerrie Pleasure, PT 10/31/2019, 2:35 PM  [facility] [address] [place], [place], [address] Phone: [phone]   Fax:  [phone]  Name: [name] MRN: [id number] Date of Birth: [birth year]

## 2019-11-01 MED FILL — TEMOZOLOMIDE 140 MG CAPS: 140 | 28 days supply | Qty: 5 | Fill #0

## 2019-11-01 MED FILL — TEMOZOLOMIDE 180 MG CAPS: 180 | 28 days supply | Qty: 5 | Fill #0

## 2019-11-02 ENCOUNTER — Encounter: Payer: Medicare Other | Admitting: Occupational Therapy

## 2019-11-02 ENCOUNTER — Ambulatory Visit: Payer: Medicare Other

## 2019-11-03 ENCOUNTER — Inpatient Hospital Stay: Payer: Medicare Other | Admitting: Physical Medicine and Rehabilitation

## 2019-11-03 ENCOUNTER — Ambulatory Visit: Payer: Medicare Other

## 2019-11-07 ENCOUNTER — Ambulatory Visit: Payer: Medicare Other

## 2019-11-07 ENCOUNTER — Encounter: Payer: Medicare Other | Admitting: Occupational Therapy

## 2019-11-07 ENCOUNTER — Telehealth: Payer: Self-pay | Admitting: *Deleted

## 2019-11-07 ENCOUNTER — Ambulatory Visit: Payer: Medicare Other | Admitting: Occupational Therapy

## 2019-11-07 NOTE — Telephone Encounter (Signed)
Increase Decadron to 4mg  BID just during the week of chemotherapy (next 7 days) then decrease back down to 4mg  daily.  Ventura Sellers, MD

## 2019-11-07 NOTE — Telephone Encounter (Signed)
Relayed instructions to wife to go back to Decadron 4 mg BID.  No further instructions.

## 2019-11-07 NOTE — Telephone Encounter (Signed)
Patients wife called due to recent decline.  At last appointment 10/30/2019 they decreased Decadron 4mg  to once a day.  He started this cycle Temodar last night 11/06/2019.  Yesterday he was weaker and fell.  This morning he was able to get up with walker once but then he fell again.  Left leg unable to move.    Routed to MD to advise if appointment is warranted (phone/inperson) or if orders are given.

## 2019-11-08 ENCOUNTER — Telehealth: Payer: Self-pay | Admitting: Internal Medicine

## 2019-11-09 ENCOUNTER — Other Ambulatory Visit: Payer: Self-pay

## 2019-11-09 ENCOUNTER — Encounter: Payer: Self-pay | Admitting: Occupational Therapy

## 2019-11-09 ENCOUNTER — Encounter: Payer: Medicare Other | Admitting: Occupational Therapy

## 2019-11-09 ENCOUNTER — Ambulatory Visit: Payer: Medicare Other

## 2019-11-09 ENCOUNTER — Ambulatory Visit: Payer: Medicare Other | Admitting: Occupational Therapy

## 2019-11-09 ENCOUNTER — Inpatient Hospital Stay (HOSPITAL_BASED_OUTPATIENT_CLINIC_OR_DEPARTMENT_OTHER): Payer: Medicare Other | Admitting: Internal Medicine

## 2019-11-09 DIAGNOSIS — G9340 Encephalopathy, unspecified: Secondary | ICD-10-CM

## 2019-11-09 DIAGNOSIS — R41844 Frontal lobe and executive function deficit: Secondary | ICD-10-CM | POA: Diagnosis not present

## 2019-11-09 DIAGNOSIS — M6281 Muscle weakness (generalized): Secondary | ICD-10-CM

## 2019-11-09 DIAGNOSIS — R2681 Unsteadiness on feet: Secondary | ICD-10-CM

## 2019-11-09 DIAGNOSIS — R42 Dizziness and giddiness: Secondary | ICD-10-CM

## 2019-11-09 DIAGNOSIS — C719 Malignant neoplasm of brain, unspecified: Secondary | ICD-10-CM

## 2019-11-09 DIAGNOSIS — R2689 Other abnormalities of gait and mobility: Secondary | ICD-10-CM | POA: Diagnosis not present

## 2019-11-09 DIAGNOSIS — R41842 Visuospatial deficit: Secondary | ICD-10-CM

## 2019-11-09 DIAGNOSIS — H53462 Homonymous bilateral field defects, left side: Secondary | ICD-10-CM | POA: Diagnosis not present

## 2019-11-09 DIAGNOSIS — R278 Other lack of coordination: Secondary | ICD-10-CM

## 2019-11-09 DIAGNOSIS — R41840 Attention and concentration deficit: Secondary | ICD-10-CM

## 2019-11-09 NOTE — Therapy (Signed)
[facility] [address] [place], [place], [address] Phone: [phone]   Fax:  [phone]  Physical Therapy Treatment  Patient Details  Name: [name] MRN: [id number] Date of Birth: 08-29-1945 Referring Provider (PT): [name]   Encounter Date: 11/09/2019   PT End of Session - 11/09/19 1328    Visit Number 4    Number of Visits 17    Date for PT Re-Evaluation 12/19/19    Progress Note Due on Visit 10    PT Start Time 1320    PT Stop Time 1400    PT Time Calculation (min) 40 min    Equipment Utilized During Treatment Gait belt    Activity Tolerance Patient tolerated treatment well;Patient limited by fatigue    Behavior During Therapy [facility] for tasks assessed/performed           Past Medical History:  Diagnosis Date  . Anxiety   . Arthritis   . ASCVD (arteriosclerotic cardiovascular disease)    03/2004: DES x2 to the RCA; IMI with stent occlusion in 02/2005- suboptimal Plavix compliance  . CAD (coronary artery disease)   . Carcinoma of prostate ([place])    Clopidogrel held for biopsy  . Constipation due to pain medication   . Diabetes mellitus without complication ([place])   . Diabetic neuropathy ([place])   . Headache 2021  . Hyperlipidemia   . Hypertension   . Mild depression ([place])   . Morbid obesity ([place])   . Myocardial infarction ([place])   . Shortness of breath dyspnea   . Sleep apnea    no longer uses cpap  . Stroke ([place]) 2017  . Tobacco abuse    stopped smoking 06/28/09    Past Surgical History:  Procedure Laterality Date  . APPLICATION OF CRANIAL NAVIGATION N/A 07/04/2019   Procedure: APPLICATION OF CRANIAL NAVIGATION;  Surgeon: Ashok Pall, MD;  Location: [facility];  Service: Neurosurgery;  Laterality: N/A;  . BACK SURGERY    . CARDIAC CATHETERIZATION     X 1 stent before having CABG  . CORONARY ARTERY BYPASS GRAFT  06/28/2008   X4  . CRANIOTOMY Right 07/04/2019   Procedure: Right occipital  craniotomy for tumor with brainlab;  Surgeon: Ashok Pall, MD;  Location: [facility];  Service: Neurosurgery;  Laterality: Right;  . ENDARTERECTOMY Right 07/03/2015   Procedure: RIGHT CAROTID ENDARTERECTOMY WITH BOVINE PERICARDIUM PATCH ANGIOPLASTY;  Surgeon: Serafina Mitchell, MD;  Location: [facility];  Service: Vascular;  Laterality: Right;  . LUMBAR LAMINECTOMY/DECOMPRESSION MICRODISCECTOMY N/A 09/06/2014   Procedure: LUMBER DECOMPRESSION L3-5;  Surgeon: Melina Schools, MD;  Location: [facility];  Service: Orthopedics;  Laterality: N/A;  . LUMBAR SPINE SURGERY  2002  . PROSTATE BIOPSY    . PROSTATECTOMY  02/2007  . VASECTOMY      There were no vitals filed for this visit.   Subjective Assessment - 11/09/19 1326    Subjective He started new Chemo (more than double the dose) on Monday. Monday night and tuesday morning he fell at home due to weakness in legs. HE started on steroids and he is feeling better but still weak.    Patient is accompained by: Family member    Pertinent History PMH: Rt occipital craniotomy 07/04/19 secondary to glioblastoma. PMH: Prostate CA, CVA 2017 (no residual deficits), CAD, DM, HTN, hx of 2 lumbar surgeries 2002, 2016 (laminectomies)    Limitations Standing;Walking    How long can you sit comfortably? no issues  Treatment: Wife reports it takes a lot to get him in and out of the bed and she has to help with his legs and then scoot him over to this side  Practiced seated to supine transfers: 5x, initally increased verbal and tactile cueing required by the 5th trial, patient was able to do transfers with min A. Pt needed cues to find pillows and bring them closer, elbow down and lay on L shoulder completely and then slowly bring legs up (able to bring R leg up and needs min A with left Leg) then roll to supine, needed cues to keep knees bent before scooting side to side. Cues for supine to sidelying and dropping legs before he pushes with elbow and hand to  get back up.  Walked patient from gym to his car, 200' with RW, cues to stand up straight and shoulders down to reduce WB through UE                      PT Short Term Goals - 10/24/19 1850      PT SHORT TERM GOAL #1   Title Pt will be able to perform 5x sit to stand under 30 seconds to improve functional strengh    Baseline Pt unable to perform them (10/24/19)    Time 4    Period Weeks    Status New    Target Date 11/21/19      PT SHORT TERM GOAL #2   Title Patient will be able to ambulate for 15 min without rest with use of his walker to improve walking endurnace    Baseline <5 min (10/24/19)    Time 4    Period Weeks    Status New    Target Date 11/21/19      PT SHORT TERM GOAL #3   Title Pt will demo 4 points improvement on Berg balance scale to improve overall static balance    Baseline 37/56 (10/24/19)    Time 4    Period Weeks    Status New    Target Date 11/21/19             PT Long Term Goals - 10/24/19 1852      PT LONG TERM GOAL #1   Title Pt will demo 47/56 or better on BBS to improve balance and reduce fall risk    Baseline 37/26 (10/24/19)    Time 8    Period Weeks    Status New    Target Date 12/19/19      PT LONG TERM GOAL #2   Title Patient will be able to perform sit to stand in under 25 seconds to improve overall strength    Baseline unable (10/24/19)    Time 8    Period Weeks    Status New    Target Date 12/19/19      PT LONG TERM GOAL #3   Title Pt will be able to ambulate 100' without AD to improve household ambulation without AD    Baseline Pt uses cane in house (Eval)    Time 8    Period Weeks    Status New    Target Date 12/19/19      PT LONG TERM GOAL #4   Title Pt will demo improvement by 0.15 m/s with use of st. cane to improve functional gait velocity    Baseline 0.61m/s with cane, 0.4m/s with rolling walker (eval)    Time 8  Period Weeks    Status New    Target Date 12/19/19                 Plan -  11/09/19 1240    Clinical Impression Statement Today'se session was focused on improving bed mobility to improve independence and reduce caregiver burden. Patient able to do sit to supine with min A. Did not need to sit from PT gym to car with ambulation.    Personal Factors and Comorbidities Comorbidity 3+    Comorbidities craniotomy secondary to glioblastoma, hx of lumbar surgeries x 2, CABG x 4    Examination-Activity Limitations Squat;Stairs;Locomotion Level;Caring for Others;Carry    Examination-Participation Restrictions Cleaning;Community Activity;Driving;Laundry;Yard Work;Shop    Stability/Clinical Decision Making Unstable/Unpredictable    Rehab Potential Good    PT Frequency 2x / week    PT Duration 8 weeks    PT Treatment/Interventions ADLs/Self Care Home Management;Gait training;Stair training;Functional mobility training;Therapeutic activities;Therapeutic exercise;Balance training;Neuromuscular re-education;Vestibular;Visual/perceptual remediation/compensation    PT Next Visit Plan Practice bed mobility,           Patient will benefit from skilled therapeutic intervention in order to improve the following deficits and impairments:  Abnormal gait, Decreased activity tolerance, Decreased balance, Decreased mobility, Decreased endurance, Difficulty walking, Dizziness, Impaired vision/preception  Visit Diagnosis: Unsteadiness on feet  Muscle weakness (generalized)  Other abnormalities of gait and mobility  Dizziness and giddiness  Glioblastoma multiforme (HCC)  Encephalopathy acute     Problem List Patient Active Problem List   Diagnosis Date Noted  . Glioblastoma multiforme ([place]) 10/11/2019  . Dyslipidemia   . Seizure prophylaxis   . Chronic diastolic congestive heart failure ([place])   . Diabetic peripheral neuropathy ([place])   . Palliative care by specialist   . Weakness 10/08/2019  . Vasogenic edema ([place]) 10/08/2019  . Gross hematuria 10/08/2019  . DNR (do not  resuscitate) discussion 07/13/2019  . S/P craniotomy 07/04/2019  . 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 Respiratory failure ([place])   . Encephalopathy acute   . Brain mass 07/01/2019  . Fever 07/01/2019  . Atherosclerosis of native artery of both lower extremities with intermittent claudication ([place]) 08/23/2018  . Coronary artery disease without angina pectoris 08/23/2018  . Dyspnea on exertion 08/23/2018  . Biochemically recurrent malignant neoplasm of prostate 08/16/2018  . S/P carotid endarterectomy 08/20/2015  . Essential hypertension 08/20/2015  . Type 2 diabetes mellitus with circulatory disorder ([place]) 08/20/2015  . HLD (hyperlipidemia) 08/20/2015  . Carotid artery stenosis, symptomatic 07/03/2015  . Carotid stenosis, right   . TIA (transient ischemic attack) 06/18/2015  . ASCVD (arteriosclerotic cardiovascular disease) 06/18/2015  . Diabetes mellitus without complication ([place]) 85/27/7824  . Resistant hypertension 06/18/2015  . Hyperlipidemia 06/18/2015  . Spinal stenosis of lumbar region with neurogenic claudication 09/06/2014  . Atherosclerosis of native artery of extremity with intermittent claudication ([place]) 03/09/2013  . HYPERLIPIDEMIA 04/08/2009  . TOBACCO ABUSE 04/08/2009  . ATHEROSCLEROTIC CARDIOVASCULAR DISEASE 04/08/2009  . INTERMITTENT VERTIGO 04/08/2009  . ADENOCARCINOMA, PROSTATE 03/21/2009  . ABDOMINAL PAIN -GENERALIZED 03/21/2009  . History of coronary artery bypass graft 06/28/2008    Kerrie Pleasure 11/09/2019, 2:14 PM  [facility] [address] [place], [place], [address] Phone: [phone]   Fax:  [phone]  Name: THEADORE BLUNCK MRN: [id number] Date of Birth: 01/21/1946

## 2019-11-09 NOTE — Therapy (Signed)
[facility] [address] [place], [place], [address] Phone: [phone]   Fax:  [phone]  Occupational Therapy Treatment  Patient Details  Name: [name] MRN: [id number] Date of Birth: 12-04-1945 Referring Provider (OT): Dr. Bertis Ruddy   Encounter Date: 11/09/2019   OT End of Session - 11/09/19 1257    Visit Number 4    Number of Visits 25    Date for OT Re-Evaluation 01/23/20    Authorization Type MCR primary, AARP secondary    Authorization Time Period 90 days, may d/c after 8 weeks dep on progress.    Authorization - Visit Number 4    Authorization - Number of Visits 10    Progress Note Due on Visit 10    OT Start Time 1238    OT Stop Time 1317    OT Time Calculation (min) 39 min    Activity Tolerance Patient tolerated treatment well    Behavior During Therapy WFL for tasks assessed/performed           Past Medical History:  Diagnosis Date  . Anxiety   . Arthritis   . ASCVD (arteriosclerotic cardiovascular disease)    03/2004: DES x2 to the RCA; IMI with stent occlusion in 02/2005- suboptimal Plavix compliance  . CAD (coronary artery disease)   . Carcinoma of prostate ([place])    Clopidogrel held for biopsy  . Constipation due to pain medication   . Diabetes mellitus without complication ([place])   . Diabetic neuropathy ([place])   . Headache 2021  . Hyperlipidemia   . Hypertension   . Mild depression ([place])   . Morbid obesity ([place])   . Myocardial infarction ([place])   . Shortness of breath dyspnea   . Sleep apnea    no longer uses cpap  . Stroke ([place]) 2017  . Tobacco abuse    stopped smoking 06/28/09    Past Surgical History:  Procedure Laterality Date  . APPLICATION OF CRANIAL NAVIGATION N/A 07/04/2019   Procedure: APPLICATION OF CRANIAL NAVIGATION;  Surgeon: Ashok Pall, MD;  Location: [facility];  Service: Neurosurgery;  Laterality: N/A;  . BACK SURGERY    . CARDIAC CATHETERIZATION     X  1 stent before having CABG  . CORONARY ARTERY BYPASS GRAFT  06/28/2008   X4  . CRANIOTOMY Right 07/04/2019   Procedure: Right occipital craniotomy for tumor with brainlab;  Surgeon: Ashok Pall, MD;  Location: [facility] [place] [facility];  Service: Neurosurgery;  Laterality: Right;  . ENDARTERECTOMY Right 07/03/2015   Procedure: RIGHT CAROTID ENDARTERECTOMY WITH BOVINE PERICARDIUM PATCH ANGIOPLASTY;  Surgeon: Serafina Mitchell, MD;  Location: [facility];  Service: Vascular;  Laterality: Right;  . LUMBAR LAMINECTOMY/DECOMPRESSION MICRODISCECTOMY N/A 09/06/2014   Procedure: LUMBER DECOMPRESSION L3-5;  Surgeon: Melina Schools, MD;  Location: [facility];  Service: Orthopedics;  Laterality: N/A;  . LUMBAR SPINE SURGERY  2002  . PROSTATE BIOPSY    . PROSTATECTOMY  02/2007  . VASECTOMY      There were no vitals filed for this visit.   Subjective Assessment - 11/09/19 1245    Subjective  Felt better the last 2 days.  due to weakness, MD incr steroid again.    Pertinent History [name] is a 74 y.o. 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, prostate cancer with radiation therapy prostatectomy, CAD with CABG maintained on Xarelto, chronic  diastolic congestive heart failure.  Lives with spouse two-level home he had been receiving outpatient therapies.  Presented 10/08/2019 with increasing left-sided weakness headache nausea vomiting as well as bouts of hematuria.   CT of the head showed new severe vasogenic edema throughout the posterior right cerebral hemisphere with 8 mm leftNo evidence of acute intracranial abnormality.  Follow-up medical oncology maintained on Decadron therapy.    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 No/denies             Visual scanning to copy medium peg designs with L hand for incr coordination.  Pt with mod cueing for use of visual strategies including looking to the edge of objects.    Environmental scanning to locate familiar person on the L with mod cueing for continued head turns.    Folding towel with mod difficulty due to visual-perceptual deficits.  Recommended pt fold towels at home to help with this.   Discussed use of contrast with plate, in areas that are difficult when possible to assist with locating edge.           OT Short Term Goals - 10/25/19 0739      OT SHORT TERM GOAL #1   Title Pt to verbalize understanding of visual compensatory strategies to improve daily function including reading and environmental scanning    Time 4    Period Weeks    Status On-going      OT SHORT TERM GOAL #2   Title Pt to perform tabletop scanning with 90% or greater accuracy using strategies    Baseline 85% for 1.5 M number cancellation    Time 4    Period Weeks    Status New      OT SHORT TERM GOAL #3   Title Pt will read 3/4 page of text with accuracy, visual aids/AE and extra time PRN    Time 4    Period Weeks    Status New      OT SHORT TERM GOAL #4   Title Pt will perform dressing with supervision/ set up    Baseline min A for shoes/ socks    Time 4    Period Weeks    Status New      OT SHORT TERM GOAL #5   Title Pt will demonstrate improved LUE fine motor coordination for ADLs as evidenced by decreasing 9 hole peg test score to 52 secs or less.    Baseline RUE 33 secs, LUE 58.25    Time 4    Period Weeks    Status New      Additional Short Term Goals   Additional Short Term Goals Yes      OT SHORT TERM GOAL #6   Title Pt will demonstrate ability to retrieve a llightweight object at 130 shoulder flexion with LUE demonstrating good control.    Baseline shoulder flexion RUE 140, LUE 120    Time 4    Period Weeks    Status New             OT Long Term Goals -  10/25/19 0747      OT LONG TERM GOAL #1   Title Pt will perform environmental scanning at 90% accuracy in mod distracting environment    Time 12    Period Weeks    Status New  Target Date 01/23/20      OT LONG TERM GOAL #2   Title Pt to read full page of text or greater with visual aids/AE prn    Time 12    Period Weeks    Status New      OT LONG TERM GOAL #3   Title Pt will demonstrate improved fine motor coordination in LUE for ADLS as evidenced by decreasing 9 hole peg test score to 48 secs or less.    Baseline Pt will perform all basic ADLS with distant supervision.    Time 12    Period Weeks    Status New      OT LONG TERM GOAL #4   Title Pt will perform basic home management tasks with supervision demonstrating good safety awareness.    Time 12    Period Weeks    Status New                 Plan - 11/09/19 1333    Clinical Impression Statement Pt is progressing slowly towards goals.  Pt with 2 falls this week due to weakness (controlled falls with no injury).  Pt will continue to benefit from reinforcement/repetition of visual scanning strategies.    OT Occupational Profile and History Detailed Assessment- Review of Records and additional review of physical, cognitive, psychosocial history related to current functional performance    Occupational performance deficits (Please refer to evaluation for details): IADL's;Work;Leisure;Social Participation;ADL's    Body Structure / Function / Physical Skills IADL;Endurance;Balance;Vision;ADL;Mobility;Strength;UE functional use;FMC;Coordination;Gait;GMC;ROM;Decreased knowledge of use of DME;Dexterity    Cognitive Skills Memory;Problem Solve;Perception;Thought;Safety Awareness    Rehab Potential Good    Clinical Decision Making Several treatment options, min-mod task modification necessary    Comorbidities Affecting Occupational Performance: May have comorbidities impacting occupational performance    Modification or  Assistance to Complete Evaluation  No modification of tasks or assist necessary to complete eval    OT Frequency 2x / week    OT Duration 12 weeks   may d/c after 8 weeks dep on progress.   OT Treatment/Interventions Self-care/ADL training;Functional Furniture conservator/restorer;Therapeutic activities;Coping strategies training;Visual/perceptual remediation/compensation;Patient/family education;Cognitive remediation/compensation;Therapeutic exercise;Balance training;Manual Therapy;Neuromuscular education;Aquatic Therapy;Energy conservation;DME and/or AE instruction;Paraffin;Fluidtherapy;Gait Training;Passive range of motion;Moist Heat    Plan environmental scanning, continue with coordination    Consulted and Agree with Plan of Care Patient;Family member/caregiver    Family Member Consulted wife           Patient will benefit from skilled therapeutic intervention in order to improve the following deficits and impairments:   Body Structure / Function / Physical Skills: IADL, Endurance, Balance, Vision, ADL, Mobility, Strength, UE functional use, FMC, Coordination, Gait, GMC, ROM, Decreased knowledge of use of DME, Dexterity Cognitive Skills: Memory, Problem Solve, Perception, Thought, Safety Awareness     Visit Diagnosis: Visuospatial deficit  Frontal lobe and executive function deficit  Unsteadiness on feet  Muscle weakness (generalized)  Other abnormalities of gait and mobility  Other lack of coordination  Attention and concentration deficit    Problem List Patient Active Problem List   Diagnosis Date Noted  . Glioblastoma multiforme ([place]) 10/11/2019  . Dyslipidemia   . Seizure prophylaxis   . Chronic diastolic congestive heart failure ([place])   . Diabetic peripheral neuropathy ([place])   . Palliative care by specialist   . Weakness 10/08/2019  . Vasogenic edema ([place]) 10/08/2019  . Gross hematuria 10/08/2019  . DNR (do not resuscitate) discussion 07/13/2019  . S/P craniotomy  07/04/2019  . Glioblastoma  with isocitrate dehydrogenase gene wildtype ([place]) 07/04/2019  . Respiratory failure ([place])   . Encephalopathy acute   . Brain mass 07/01/2019  . Fever 07/01/2019  . Atherosclerosis of native artery of both lower extremities with intermittent claudication ([place]) 08/23/2018  . Coronary artery disease without angina pectoris 08/23/2018  . Dyspnea on exertion 08/23/2018  . Biochemically recurrent malignant neoplasm of prostate 08/16/2018  . S/P carotid endarterectomy 08/20/2015  . Essential hypertension 08/20/2015  . Type 2 diabetes mellitus with circulatory disorder ([place]) 08/20/2015  . HLD (hyperlipidemia) 08/20/2015  . Carotid artery stenosis, symptomatic 07/03/2015  . Carotid stenosis, right   . TIA (transient ischemic attack) 06/18/2015  . ASCVD (arteriosclerotic cardiovascular disease) 06/18/2015  . Diabetes mellitus without complication ([place]) 65/78/4696  . Resistant hypertension 06/18/2015  . Hyperlipidemia 06/18/2015  . Spinal stenosis of lumbar region with neurogenic claudication 09/06/2014  . Atherosclerosis of native artery of extremity with intermittent claudication ([place]) 03/09/2013  . HYPERLIPIDEMIA 04/08/2009  . TOBACCO ABUSE 04/08/2009  . ATHEROSCLEROTIC CARDIOVASCULAR DISEASE 04/08/2009  . INTERMITTENT VERTIGO 04/08/2009  . ADENOCARCINOMA, PROSTATE 03/21/2009  . ABDOMINAL PAIN -GENERALIZED 03/21/2009  . History of coronary artery bypass graft 06/28/2008    [facility] 11/09/2019, 1:35 PM  [facility] [address] [place], [place], [address] Phone: [phone]   Fax:  [phone]  Name: VIRLAN KEMPKER MRN: [id number] Date of Birth: [birth year]   Vianne Bulls, OTR/L [facility] [address] [place], [place]  [id number] [phone] phone [phone] 11/09/19 1:35 PM

## 2019-11-09 NOTE — Progress Notes (Signed)
I connected with [name] on 11/09/19 at  9:00 AM EDT by telephone visit and verified that I am speaking with the correct person using two identifiers.  I discussed the limitations, risks, security and privacy concerns of performing an evaluation and management service by telemedicine and the availability of in-person appointments. I also discussed with the patient that there may be a patient responsible charge related to this service. The patient expressed understanding and agreed to proceed.  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  At this time he feels more or less at his usual baseline.  No issues with Temodar thus far, currently on day #4. 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  He may stay on this dose until we see him again early next month for pre-treat visit.   Follow Up Instructions: RTC as scheduled, call with any clinical changes.  I discussed the assessment and treatment plan with the patient.  The patient was provided an opportunity to ask questions and all were answered.  The patient agreed with the plan and demonstrated understanding of the instructions.    The patient was advised to call back or seek an in-person evaluation if the symptoms worsen or if the condition fails to improve as anticipated.  I provided 5-10 minutes of non-face-to-face time during this enocunter.  Mark Sellers, MD   I provided 15 minutes of non face-to-face telephone visit  time during this encounter, and > 50% was spent counseling as documented under my assessment & plan.

## 2019-11-10 ENCOUNTER — Ambulatory Visit: Payer: Medicare Other

## 2019-11-13 DIAGNOSIS — I10 Essential (primary) hypertension: Secondary | ICD-10-CM | POA: Diagnosis not present

## 2019-11-13 DIAGNOSIS — Z6826 Body mass index (BMI) 26.0-26.9, adult: Secondary | ICD-10-CM | POA: Diagnosis not present

## 2019-11-13 DIAGNOSIS — C719 Malignant neoplasm of brain, unspecified: Secondary | ICD-10-CM | POA: Diagnosis not present

## 2019-11-14 ENCOUNTER — Other Ambulatory Visit: Payer: Self-pay

## 2019-11-14 ENCOUNTER — Encounter: Payer: Medicare Other | Admitting: Occupational Therapy

## 2019-11-14 ENCOUNTER — Ambulatory Visit: Payer: Medicare Other

## 2019-11-14 ENCOUNTER — Ambulatory Visit: Payer: Medicare Other | Admitting: Occupational Therapy

## 2019-11-14 DIAGNOSIS — R2689 Other abnormalities of gait and mobility: Secondary | ICD-10-CM

## 2019-11-14 DIAGNOSIS — R2681 Unsteadiness on feet: Secondary | ICD-10-CM

## 2019-11-14 DIAGNOSIS — R41842 Visuospatial deficit: Secondary | ICD-10-CM | POA: Diagnosis not present

## 2019-11-14 DIAGNOSIS — C719 Malignant neoplasm of brain, unspecified: Secondary | ICD-10-CM

## 2019-11-14 DIAGNOSIS — R42 Dizziness and giddiness: Secondary | ICD-10-CM

## 2019-11-14 DIAGNOSIS — R41844 Frontal lobe and executive function deficit: Secondary | ICD-10-CM

## 2019-11-14 DIAGNOSIS — H53462 Homonymous bilateral field defects, left side: Secondary | ICD-10-CM | POA: Diagnosis not present

## 2019-11-14 DIAGNOSIS — M6281 Muscle weakness (generalized): Secondary | ICD-10-CM

## 2019-11-14 DIAGNOSIS — R41840 Attention and concentration deficit: Secondary | ICD-10-CM | POA: Diagnosis not present

## 2019-11-14 DIAGNOSIS — R278 Other lack of coordination: Secondary | ICD-10-CM

## 2019-11-14 NOTE — Therapy (Signed)
[facility] [address] [place], [place], [address] Phone: [phone]   Fax:  [phone]  Physical Therapy Treatment  Patient Details  Name: [name] MRN: [id number] Date of Birth: [birth year] Referring Provider (PT): [name]   Encounter Date: 11/14/2019   PT End of Session - 11/14/19 1413    Visit Number 5    Number of Visits 17    Date for PT Re-Evaluation 12/19/19    Progress Note Due on Visit 10    PT Start Time 1400    PT Stop Time 1445    PT Time Calculation (min) 45 min    Equipment Utilized During Treatment Gait belt    Activity Tolerance Patient tolerated treatment well;Patient limited by fatigue    Behavior During Therapy [facility] for tasks assessed/performed           Past Medical History:  Diagnosis Date  . Anxiety   . Arthritis   . ASCVD (arteriosclerotic cardiovascular disease)    03/2004: DES x2 to the RCA; IMI with stent occlusion in 02/2005- suboptimal Plavix compliance  . CAD (coronary artery disease)   . Carcinoma of prostate ([place])    Clopidogrel held for biopsy  . Constipation due to pain medication   . Diabetes mellitus without complication ([place])   . Diabetic neuropathy ([place])   . Headache 2021  . Hyperlipidemia   . Hypertension   . Mild depression ([place])   . Morbid obesity ([place])   . Myocardial infarction ([place])   . Shortness of breath dyspnea   . Sleep apnea    no longer uses cpap  . Stroke ([place]) 2017  . Tobacco abuse    stopped smoking 06/28/09    Past Surgical History:  Procedure Laterality Date  . APPLICATION OF CRANIAL NAVIGATION N/A 07/04/2019   Procedure: APPLICATION OF CRANIAL NAVIGATION;  Surgeon: Ashok Pall, MD;  Location: [facility];  Service: Neurosurgery;  Laterality: N/A;  . BACK SURGERY    . CARDIAC CATHETERIZATION     X 1 stent before having CABG  . CORONARY ARTERY BYPASS GRAFT  06/28/2008   X4  . CRANIOTOMY Right 07/04/2019   Procedure: Right occipital  craniotomy for tumor with brainlab;  Surgeon: Ashok Pall, MD;  Location: [facility];  Service: Neurosurgery;  Laterality: Right;  . ENDARTERECTOMY Right 07/03/2015   Procedure: RIGHT CAROTID ENDARTERECTOMY WITH BOVINE PERICARDIUM PATCH ANGIOPLASTY;  Surgeon: Serafina Mitchell, MD;  Location: [facility];  Service: Vascular;  Laterality: Right;  . LUMBAR LAMINECTOMY/DECOMPRESSION MICRODISCECTOMY N/A 09/06/2014   Procedure: LUMBER DECOMPRESSION L3-5;  Surgeon: Melina Schools, MD;  Location: [facility];  Service: Orthopedics;  Laterality: N/A;  . LUMBAR SPINE SURGERY  2002  . PROSTATE BIOPSY    . PROSTATECTOMY  02/2007  . VASECTOMY      There were no vitals filed for this visit.   Subjective Assessment - 11/14/19 1358    Subjective They are decreasing steroids from 8mg  to 6 mg starting today.    Patient is accompained by: Family member    Pertinent History PMH: Rt occipital craniotomy 07/04/19 secondary to glioblastoma. PMH: Prostate CA, CVA 2017 (no residual deficits), CAD, DM, HTN, hx of 2 lumbar surgeries 2002, 2016 (laminectomies)    Limitations Standing;Walking    How long can you sit comfortably? no issues                     Therapeutic activity: Sit to stand: 3 x 5 from MAT  TABLE  With bil UE Gait training: 1 x 414' with RW, cues for heel to toe walking, scanning environment ahead, negotiating obstacles Nustep: level 3 for 10', 3-4 rest breaks                   PT Short Term Goals - 10/24/19 1850      PT SHORT TERM GOAL #1   Title Pt will be able to perform 5x sit to stand under 30 seconds to improve functional strengh    Baseline Pt unable to perform them (10/24/19)    Time 4    Period Weeks    Status New    Target Date 11/21/19      PT SHORT TERM GOAL #2   Title Patient will be able to ambulate for 15 min without rest with use of his walker to improve walking endurnace    Baseline <5 min (10/24/19)    Time 4    Period Weeks    Status New    Target Date 11/21/19       PT SHORT TERM GOAL #3   Title Pt will demo 4 points improvement on Berg balance scale to improve overall static balance    Baseline 37/56 (10/24/19)    Time 4    Period Weeks    Status New    Target Date 11/21/19             PT Long Term Goals - 10/24/19 1852      PT LONG TERM GOAL #1   Title Pt will demo 47/56 or better on BBS to improve balance and reduce fall risk    Baseline 37/26 (10/24/19)    Time 8    Period Weeks    Status New    Target Date 12/19/19      PT LONG TERM GOAL #2   Title Patient will be able to perform sit to stand in under 25 seconds to improve overall strength    Baseline unable (10/24/19)    Time 8    Period Weeks    Status New    Target Date 12/19/19      PT LONG TERM GOAL #3   Title Pt will be able to ambulate 100' without AD to improve household ambulation without AD    Baseline Pt uses cane in house (Eval)    Time 8    Period Weeks    Status New    Target Date 12/19/19      PT LONG TERM GOAL #4   Title Pt will demo improvement by 0.15 m/s with use of st. cane to improve functional gait velocity    Baseline 0.16m/s with cane, 0.17m/s with rolling walker (eval)    Time 8    Period Weeks    Status New    Target Date 12/19/19                 Plan - 11/14/19 1444    Clinical Impression Statement Pt demonstrated improved walking endurance today. Pt was encouraged to stay active at home and practice walking in side the home or outside home with his wife to build up his endurance. Pt required multiple breaks on Nustep due to fatigue from the session.    Personal Factors and Comorbidities Comorbidity 3+    Comorbidities craniotomy secondary to glioblastoma, hx of lumbar surgeries x 2, CABG x 4    Examination-Activity Limitations Squat;Stairs;Locomotion Level;Caring for Others;Carry    Examination-Participation Restrictions Cleaning;Community Activity;Driving;Laundry;Yard Work;Shop  Stability/Clinical Decision Making  Unstable/Unpredictable    Rehab Potential Good    PT Frequency 2x / week    PT Duration 8 weeks    PT Treatment/Interventions ADLs/Self Care Home Management;Gait training;Stair training;Functional mobility training;Therapeutic activities;Therapeutic exercise;Balance training;Neuromuscular re-education;Vestibular;Visual/perceptual remediation/compensation    PT Next Visit Plan Practice bed mobility,           Patient will benefit from skilled therapeutic intervention in order to improve the following deficits and impairments:  Abnormal gait, Decreased activity tolerance, Decreased balance, Decreased mobility, Decreased endurance, Difficulty walking, Dizziness, Impaired vision/preception  Visit Diagnosis: Unsteadiness on feet  Muscle weakness (generalized)  Other abnormalities of gait and mobility  Dizziness and giddiness  Glioblastoma multiforme (HCC)     Problem List Patient Active Problem List   Diagnosis Date Noted  . 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 Dyslipidemia   . Seizure prophylaxis   . Chronic diastolic congestive heart failure ([place])   . Diabetic peripheral neuropathy ([place])   . Palliative care by specialist   . Weakness 10/08/2019  . Vasogenic edema ([place]) 10/08/2019  . Gross hematuria 10/08/2019  . DNR (do not resuscitate) discussion 07/13/2019  . S/P craniotomy 07/04/2019  . 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 Respiratory failure ([place])   . Encephalopathy acute   . Brain mass 07/01/2019  . Fever 07/01/2019  . Atherosclerosis of native artery of both lower extremities with intermittent claudication ([place]) 08/23/2018  . Coronary artery disease without angina pectoris 08/23/2018  . Dyspnea on exertion 08/23/2018  . Biochemically recurrent malignant neoplasm of prostate 08/16/2018  . S/P carotid endarterectomy 08/20/2015  . Essential hypertension 08/20/2015  . Type 2 diabetes mellitus with circulatory disorder  ([place]) 08/20/2015  . HLD (hyperlipidemia) 08/20/2015  . Carotid artery stenosis, symptomatic 07/03/2015  . Carotid stenosis, right   . TIA (transient ischemic attack) 06/18/2015  . ASCVD (arteriosclerotic cardiovascular disease) 06/18/2015  . Diabetes mellitus without complication ([place]) 48/54/6270  . Resistant hypertension 06/18/2015  . Hyperlipidemia 06/18/2015  . Spinal stenosis of lumbar region with neurogenic claudication 09/06/2014  . Atherosclerosis of native artery of extremity with intermittent claudication ([place]) 03/09/2013  . HYPERLIPIDEMIA 04/08/2009  . TOBACCO ABUSE 04/08/2009  . ATHEROSCLEROTIC CARDIOVASCULAR DISEASE 04/08/2009  . INTERMITTENT VERTIGO 04/08/2009  . ADENOCARCINOMA, PROSTATE 03/21/2009  . ABDOMINAL PAIN -GENERALIZED 03/21/2009  . History of coronary artery bypass graft 06/28/2008    Kerrie Pleasure, PT 11/14/2019, 2:45 PM  [facility] [address] [place], [place], [address] Phone: [phone]   Fax:  [phone]  Name: STILLMAN BUENGER MRN: [id number] Date of Birth: Jul 05, 1945

## 2019-11-14 NOTE — Therapy (Signed)
[name], [place], [address] Phone: [phone]   Fax:  [phone]  Occupational Therapy Treatment  Patient Details  Name: [name] MRN: [id number] Date of Birth: 07/21/1945 Referring Provider (OT): Dr. Bertis Ruddy   Encounter Date: 11/14/2019   OT End of Session - 11/14/19 1354    Visit Number 5    Number of Visits 25    Date for OT Re-Evaluation 01/23/20    Authorization Type MCR primary, AARP secondary    Authorization Time Period 90 days, may d/c after 8 weeks dep on progress.    Authorization - Visit Number 5    Authorization - Number of Visits 10    Progress Note Due on Visit 10    OT Start Time 1318    OT Stop Time 1358    OT Time Calculation (min) 40 min    Activity Tolerance Patient tolerated treatment well    Behavior During Therapy WFL for tasks assessed/performed           Past Medical History:  Diagnosis Date  . Anxiety   . Arthritis   . ASCVD (arteriosclerotic cardiovascular disease)    03/2004: DES x2 to the RCA; IMI with stent occlusion in 02/2005- suboptimal Plavix compliance  . CAD (coronary artery disease)   . Carcinoma of prostate ([place])    Clopidogrel held for biopsy  . Constipation due to pain medication   . Diabetes mellitus without complication ([place])   . Diabetic neuropathy ([place])   . Headache 2021  . Hyperlipidemia   . Hypertension   . Mild depression ([place])   . Morbid obesity ([place])   . Myocardial infarction ([place])   . Shortness of breath dyspnea   . Sleep apnea    no longer uses cpap  . Stroke ([place]) 2017  . Tobacco abuse    stopped smoking 06/28/09    Past Surgical History:  Procedure Laterality Date  . APPLICATION OF CRANIAL NAVIGATION N/A 07/04/2019   Procedure: APPLICATION OF CRANIAL NAVIGATION;  Surgeon: Ashok Pall, MD;  Location: [facility];  Service: Neurosurgery;  Laterality: N/A;  . BACK SURGERY    . CARDIAC CATHETERIZATION     X  1 stent before having CABG  . CORONARY ARTERY BYPASS GRAFT  06/28/2008   X4  . CRANIOTOMY Right 07/04/2019   Procedure: Right occipital craniotomy for tumor with brainlab;  Surgeon: Ashok Pall, MD;  Location: [facility];  Service: Neurosurgery;  Laterality: Right;  . ENDARTERECTOMY Right 07/03/2015   Procedure: RIGHT CAROTID ENDARTERECTOMY WITH BOVINE PERICARDIUM PATCH ANGIOPLASTY;  Surgeon: Serafina Mitchell, MD;  Location: [facility];  Service: Vascular;  Laterality: Right;  . LUMBAR LAMINECTOMY/DECOMPRESSION MICRODISCECTOMY N/A 09/06/2014   Procedure: LUMBER DECOMPRESSION L3-5;  Surgeon: Melina Schools, MD;  Location: [facility];  Service: Orthopedics;  Laterality: N/A;  . LUMBAR SPINE SURGERY  2002  . PROSTATE BIOPSY    . PROSTATECTOMY  02/2007  . VASECTOMY      There were no vitals filed for this visit.   Subjective Assessment - 11/14/19 1356    Subjective  Denies pain    Pertinent History [name] is a 74 y.o. 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, prostate cancer with radiation therapy prostatectomy, CAD with CABG maintained on Xarelto, chronic diastolic congestive heart failure.  Lives with spouse two-level home he had  been receiving outpatient therapies.  Presented 10/08/2019 with increasing left-sided weakness headache nausea vomiting as well as bouts of hematuria.   CT of the head showed new severe vasogenic edema throughout the posterior right cerebral hemisphere with 8 mm leftNo evidence of acute intracranial abnormality.  Follow-up medical oncology maintained on Decadron therapy.    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 No/denies               Treatment: Environmental scanning 2/12 items missed on  first pass, min v.c for organized scanning. Tabletop scanning activity to place concentration pieces into pegboard with LUE for increased fine motor coordination, min difficulty/ mod v.c Copying telephone numbers from left to right side of page 1.5 m, min difficulty min  v.c for complete numbers and using finger to maintain place.                   OT Short Term Goals - 11/14/19 1455      OT SHORT TERM GOAL #1   Title Pt to verbalize understanding of visual compensatory strategies to improve daily function including reading and environmental scanning    Time 4    Period Weeks    Status On-going      OT SHORT TERM GOAL #2   Title Pt to perform tabletop scanning with 90% or greater accuracy using strategies    Baseline 85% for 1.5 M number cancellation    Time 4    Period Weeks    Status New      OT SHORT TERM GOAL #3   Title Pt will read 3/4 page of text with accuracy, visual aids/AE and extra time PRN    Time 4    Period Weeks    Status New      OT SHORT TERM GOAL #4   Title Pt will perform dressing with supervision/ set up    Baseline min A for shoes/ socks    Time 4    Period Weeks    Status New      OT SHORT TERM GOAL #5   Title Pt will demonstrate improved LUE fine motor coordination for ADLs as evidenced by decreasing 9 hole peg test score to 52 secs or less.    Baseline RUE 33 secs, LUE 58.25    Time 4    Period Weeks    Status New      OT SHORT TERM GOAL #6   Title Pt will demonstrate ability to retrieve a llightweight object at 130 shoulder flexion with LUE demonstrating good control.    Baseline shoulder flexion RUE 140, LUE 120    Time 4    Period Weeks    Status New             OT Long Term Goals - 10/25/19 0747      OT LONG TERM GOAL #1   Title Pt will perform environmental scanning at 90% accuracy in mod distracting environment    Time 12    Period Weeks    Status New    Target Date 01/23/20      OT LONG TERM GOAL #2   Title Pt  to read full page of text or greater with visual aids/AE prn    Time 12    Period Weeks    Status New  OT LONG TERM GOAL #3   Title Pt will demonstrate improved fine motor coordination in LUE for ADLS as evidenced by decreasing 9 hole peg test score to 48 secs or less.    Baseline Pt will perform all basic ADLS with distant supervision.    Time 12    Period Weeks    Status New      OT LONG TERM GOAL #4   Title Pt will perform basic home management tasks with supervision demonstrating good safety awareness.    Time 12    Period Weeks    Status New                 Plan - 11/14/19 1355    Clinical Impression Statement Pt is progressing slowly towards goals.  Pt demonstrates improving environmental scanning today, Pt continues to require v.c for tabletop scanning.    OT Occupational Profile and History Detailed Assessment- Review of Records and additional review of physical, cognitive, psychosocial history related to current functional performance    Occupational performance deficits (Please refer to evaluation for details): IADL's;Work;Leisure;Social Participation;ADL's    Body Structure / Function / Physical Skills IADL;Endurance;Balance;Vision;ADL;Mobility;Strength;UE functional use;FMC;Coordination;Gait;GMC;ROM;Decreased knowledge of use of DME;Dexterity    Cognitive Skills Memory;Problem Solve;Perception;Thought;Safety Awareness    Rehab Potential Good    Clinical Decision Making Several treatment options, min-mod task modification necessary    Comorbidities Affecting Occupational Performance: May have comorbidities impacting occupational performance    Modification or Assistance to Complete Evaluation  No modification of tasks or assist necessary to complete eval    OT Frequency 2x / week    OT Duration 12 weeks   may d/c after 8 weeks dep on progress.   OT Treatment/Interventions Self-care/ADL training;Functional Furniture conservator/restorer;Therapeutic activities;Coping strategies  training;Visual/perceptual remediation/compensation;Patient/family education;Cognitive remediation/compensation;Therapeutic exercise;Balance training;Manual Therapy;Neuromuscular education;Aquatic Therapy;Energy conservation;DME and/or AE instruction;Paraffin;Fluidtherapy;Gait Training;Passive range of motion;Moist Heat    Plan ADL strategies, continue with coordination    Consulted and Agree with Plan of Care Patient;Family member/caregiver    Family Member Consulted wife           Patient will benefit from skilled therapeutic intervention in order to improve the following deficits and impairments:   Body Structure / Function / Physical Skills: IADL, Endurance, Balance, Vision, ADL, Mobility, Strength, UE functional use, FMC, Coordination, Gait, GMC, ROM, Decreased knowledge of use of DME, Dexterity Cognitive Skills: Memory, Problem Solve, Perception, Thought, Safety Awareness     Visit Diagnosis: Unsteadiness on feet  Muscle weakness (generalized)  Visuospatial deficit  Frontal lobe and executive function deficit  Other lack of coordination  Attention and concentration deficit    Problem List Patient Active Problem List   Diagnosis Date Noted  . Glioblastoma multiforme ([place]) 10/11/2019  . Dyslipidemia   . Seizure prophylaxis   . Chronic diastolic congestive heart failure ([place])   . Diabetic peripheral neuropathy ([place])   . Palliative care by specialist   . Weakness 10/08/2019  . Vasogenic edema ([place]) 10/08/2019  . Gross hematuria 10/08/2019  . DNR (do not resuscitate) discussion 07/13/2019  . S/P craniotomy 07/04/2019  . Glioblastoma with isocitrate dehydrogenase gene wildtype ([place]) 07/04/2019  . Respiratory failure ([place])   . Encephalopathy acute   . Brain mass 07/01/2019  . Fever 07/01/2019  . Atherosclerosis of native artery of both lower extremities with intermittent claudication ([place]) 08/23/2018  . Coronary artery disease without angina pectoris 08/23/2018  .  Dyspnea on exertion 08/23/2018  . Biochemically recurrent malignant neoplasm of prostate 08/16/2018  .  S/P carotid endarterectomy 08/20/2015  . Essential hypertension 08/20/2015  . Type 2 diabetes mellitus with circulatory disorder ([place]) 08/20/2015  . HLD (hyperlipidemia) 08/20/2015  . Carotid artery stenosis, symptomatic 07/03/2015  . Carotid stenosis, right   . TIA (transient ischemic attack) 06/18/2015  . ASCVD (arteriosclerotic cardiovascular disease) 06/18/2015  . Diabetes mellitus without complication ([place]) 59/53/9672  . Resistant hypertension 06/18/2015  . Hyperlipidemia 06/18/2015  . Spinal stenosis of lumbar region with neurogenic claudication 09/06/2014  . Atherosclerosis of native artery of extremity with intermittent claudication ([place]) 03/09/2013  . HYPERLIPIDEMIA 04/08/2009  . TOBACCO ABUSE 04/08/2009  . ATHEROSCLEROTIC CARDIOVASCULAR DISEASE 04/08/2009  . INTERMITTENT VERTIGO 04/08/2009  . ADENOCARCINOMA, PROSTATE 03/21/2009  . ABDOMINAL PAIN -GENERALIZED 03/21/2009  . History of coronary artery bypass graft 06/28/2008    Anicka Stuckert 11/14/2019, 2:58 PM  [facility] [address] [place], [place], [address] Phone: [phone]   Fax:  [phone]  Name: [name] MRN: [id number] Date of Birth: 1946-01-09

## 2019-11-16 ENCOUNTER — Other Ambulatory Visit: Payer: Self-pay

## 2019-11-16 ENCOUNTER — Encounter: Payer: Medicare Other | Admitting: Occupational Therapy

## 2019-11-16 ENCOUNTER — Ambulatory Visit: Payer: Medicare Other

## 2019-11-16 ENCOUNTER — Ambulatory Visit: Payer: Medicare Other | Admitting: Occupational Therapy

## 2019-11-16 DIAGNOSIS — R2689 Other abnormalities of gait and mobility: Secondary | ICD-10-CM | POA: Diagnosis not present

## 2019-11-16 DIAGNOSIS — M6281 Muscle weakness (generalized): Secondary | ICD-10-CM

## 2019-11-16 DIAGNOSIS — R41842 Visuospatial deficit: Secondary | ICD-10-CM

## 2019-11-16 DIAGNOSIS — R41840 Attention and concentration deficit: Secondary | ICD-10-CM | POA: Diagnosis not present

## 2019-11-16 DIAGNOSIS — R41844 Frontal lobe and executive function deficit: Secondary | ICD-10-CM

## 2019-11-16 DIAGNOSIS — H53462 Homonymous bilateral field defects, left side: Secondary | ICD-10-CM | POA: Diagnosis not present

## 2019-11-16 DIAGNOSIS — R42 Dizziness and giddiness: Secondary | ICD-10-CM

## 2019-11-16 DIAGNOSIS — R2681 Unsteadiness on feet: Secondary | ICD-10-CM

## 2019-11-16 NOTE — Therapy (Signed)
[facility] [address] [place], [place], [address] Phone: [phone]   Fax:  [phone]  Physical Therapy Treatment  Patient Details  Name: [name] MRN: [id number] Date of Birth: [birth year] Referring Provider (PT): [name]   Encounter Date: 11/16/2019   PT End of Session - 11/16/19 1209    Visit Number 6    Number of Visits 17    Date for PT Re-Evaluation 12/19/19    Progress Note Due on Visit 10    PT Start Time 6222    PT Stop Time 1230    PT Time Calculation (min) 45 min    Equipment Utilized During Treatment Gait belt    Activity Tolerance Patient tolerated treatment well;Patient limited by fatigue    Behavior During Therapy [facility] for tasks assessed/performed           Past Medical History:  Diagnosis Date  . Anxiety   . Arthritis   . ASCVD (arteriosclerotic cardiovascular disease)    03/2004: DES x2 to the RCA; IMI with stent occlusion in 02/2005- suboptimal Plavix compliance  . CAD (coronary artery disease)   . Carcinoma of prostate ([place])    Clopidogrel held for biopsy  . Constipation due to pain medication   . Diabetes mellitus without complication ([place])   . Diabetic neuropathy ([place])   . Headache 2021  . Hyperlipidemia   . Hypertension   . Mild depression ([place])   . Morbid obesity ([place])   . Myocardial infarction ([place])   . Shortness of breath dyspnea   . Sleep apnea    no longer uses cpap  . Stroke ([place]) 2017  . Tobacco abuse    stopped smoking 06/28/09    Past Surgical History:  Procedure Laterality Date  . APPLICATION OF CRANIAL NAVIGATION N/A 07/04/2019   Procedure: APPLICATION OF CRANIAL NAVIGATION;  Surgeon: Ashok Pall, MD;  Location: [place];  Service: Neurosurgery;  Laterality: N/A;  . BACK SURGERY    . CARDIAC CATHETERIZATION     X 1 stent before having CABG  . CORONARY ARTERY BYPASS GRAFT  06/28/2008   X4  . CRANIOTOMY Right 07/04/2019   Procedure: Right occipital  craniotomy for tumor with brainlab;  Surgeon: Ashok Pall, MD;  Location: [facility];  Service: Neurosurgery;  Laterality: Right;  . ENDARTERECTOMY Right 07/03/2015   Procedure: RIGHT CAROTID ENDARTERECTOMY WITH BOVINE PERICARDIUM PATCH ANGIOPLASTY;  Surgeon: Serafina Mitchell, MD;  Location: [facility];  Service: Vascular;  Laterality: Right;  . LUMBAR LAMINECTOMY/DECOMPRESSION MICRODISCECTOMY N/A 09/06/2014   Procedure: LUMBER DECOMPRESSION L3-5;  Surgeon: Melina Schools, MD;  Location: [place];  Service: Orthopedics;  Laterality: N/A;  . LUMBAR SPINE SURGERY  2002  . PROSTATE BIOPSY    . PROSTATECTOMY  02/2007  . VASECTOMY      There were no vitals filed for this visit.   Subjective Assessment - 11/16/19 1203    Subjective Pt reports he was little tired after last time.    Patient is accompained by: Family member    Pertinent History PMH: Rt occipital craniotomy 07/04/19 secondary to glioblastoma. PMH: Prostate CA, CVA 2017 (no residual deficits), CAD, DM, HTN, hx of 2 lumbar surgeries 2002, 2016 (laminectomies)    Limitations Standing;Walking    How long can you sit comfortably? no issues    How long can you stand comfortably? 5 min    How long can you walk comfortably? 5 min    Patient Stated Goals walk better  Currently in Pain? No/denies                Gait training: pt cues to walk 10% faster than normal speed; 1 x 529' with RW, cues to relax shoulders, and heel to toe pattern intermittently  Sitting to SL to supine to back to SL and sitting: min A required  Stair training: 1 x 8 steps bil rail, SBA, cues to look down at his shoes to improve visual contact                     PT Education - 11/16/19 1228    Education Details Wife educated to have gait belt on bpatient when he is going up and down. Wife educated to be behind him when he is going up the steps and in front of him when he is coming down.            PT Short Term Goals - 10/24/19 1850      PT  SHORT TERM GOAL #1   Title Pt will be able to perform 5x sit to stand under 30 seconds to improve functional strengh    Baseline Pt unable to perform them (10/24/19)    Time 4    Period Weeks    Status New    Target Date 11/21/19      PT SHORT TERM GOAL #2   Title Patient will be able to ambulate for 15 min without rest with use of his walker to improve walking endurnace    Baseline <5 min (10/24/19)    Time 4    Period Weeks    Status New    Target Date 11/21/19      PT SHORT TERM GOAL #3   Title Pt will demo 4 points improvement on Berg balance scale to improve overall static balance    Baseline 37/56 (10/24/19)    Time 4    Period Weeks    Status New    Target Date 11/21/19             PT Long Term Goals - 10/24/19 1852      PT LONG TERM GOAL #1   Title Pt will demo 47/56 or better on BBS to improve balance and reduce fall risk    Baseline 37/26 (10/24/19)    Time 8    Period Weeks    Status New    Target Date 12/19/19      PT LONG TERM GOAL #2   Title Patient will be able to perform sit to stand in under 25 seconds to improve overall [name]    Baseline unable (10/24/19)    Time 8    Period Weeks    Status New    Target Date 12/19/19      PT LONG TERM GOAL #3   Title Pt will be able to ambulate 100' without AD to improve household ambulation without AD    Baseline Pt uses cane in house (Eval)    Time 8    Period Weeks    Status New    Target Date 12/19/19      PT LONG TERM GOAL #4   Title Pt will demo improvement by 0.15 m/s with use of st. cane to improve functional gait velocity    Baseline 0.49m/s with cane, 0.43m/s with rolling walker (eval)    Time 8    Period Weeks    Status New    Target Date 12/19/19  Plan - 11/16/19 1204    Clinical Impression Statement Pt demonstrated increased walking distance compared to last session.    Personal Factors and Comorbidities Comorbidity 3+    Comorbidities craniotomy secondary to  glioblastoma, hx of lumbar surgeries x 2, CABG x 4    Examination-Activity Limitations Squat;Stairs;Locomotion Level;Caring for Others;Carry    Examination-Participation Restrictions Cleaning;Community Activity;Driving;Laundry;Yard Work;Shop    Stability/Clinical Decision Making Unstable/Unpredictable    Rehab Potential Good    PT Frequency 2x / week    PT Duration 8 weeks    PT Treatment/Interventions ADLs/Self Care Home Management;Gait training;Stair training;Functional mobility training;Therapeutic activities;Therapeutic exercise;Balance training;Neuromuscular re-education;Vestibular;Visual/perceptual remediation/compensation    PT Next Visit Plan Practice bed mobility,           Patient will benefit from skilled therapeutic intervention in order to improve the following deficits and impairments:  Abnormal gait, Decreased activity tolerance, Decreased balance, Decreased mobility, Decreased endurance, Difficulty walking, Dizziness, Impaired vision/preception  Visit Diagnosis: Unsteadiness on feet  Muscle weakness (generalized)  Other abnormalities of gait and mobility  Dizziness and giddiness     Problem List Patient Active Problem List   Diagnosis Date Noted  . 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 Dyslipidemia   . Seizure prophylaxis   . Chronic diastolic congestive heart failure ([place])   . Diabetic peripheral neuropathy ([place])   . Palliative care by specialist   . Weakness 10/08/2019  . Vasogenic edema ([place]) 10/08/2019  . Gross hematuria 10/08/2019  . DNR (do not resuscitate) discussion 07/13/2019  . S/P craniotomy 07/04/2019  . 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 Respiratory failure ([place])   . Encephalopathy acute   . Brain mass 07/01/2019  . Fever 07/01/2019  . Atherosclerosis of native artery of both lower extremities with intermittent claudication ([place]) 08/23/2018  . Coronary artery disease without angina pectoris  08/23/2018  . Dyspnea on exertion 08/23/2018  . Biochemically recurrent malignant neoplasm of prostate 08/16/2018  . S/P carotid endarterectomy 08/20/2015  . Essential hypertension 08/20/2015  . Type 2 diabetes mellitus with circulatory disorder ([place]) 08/20/2015  . HLD (hyperlipidemia) 08/20/2015  . Carotid artery stenosis, symptomatic 07/03/2015  . Carotid stenosis, right   . TIA (transient ischemic attack) 06/18/2015  . ASCVD (arteriosclerotic cardiovascular disease) 06/18/2015  . Diabetes mellitus without complication ([place]) 27/09/2374  . Resistant hypertension 06/18/2015  . Hyperlipidemia 06/18/2015  . Spinal stenosis of lumbar region with neurogenic claudication 09/06/2014  . Atherosclerosis of native artery of extremity with intermittent claudication ([place]) 03/09/2013  . HYPERLIPIDEMIA 04/08/2009  . TOBACCO ABUSE 04/08/2009  . ATHEROSCLEROTIC CARDIOVASCULAR DISEASE 04/08/2009  . INTERMITTENT VERTIGO 04/08/2009  . ADENOCARCINOMA, PROSTATE 03/21/2009  . ABDOMINAL PAIN -GENERALIZED 03/21/2009  . History of coronary artery bypass graft 06/28/2008    Kerrie Pleasure 11/16/2019, 12:42 PM  [facility] [address] [place], [place], [address] Phone: [phone]   Fax:  [phone]  Name: NETANEL YANNUZZI MRN: [id number] Date of Birth: February 01, 1946

## 2019-11-16 NOTE — Therapy (Signed)
[facility] [address] [place], [place], [address] Phone: [phone]   Fax:  [phone]  Occupational Therapy Treatment  Patient Details  Name: [name] MRN: [id number] Date of Birth: 04-19-1946 Referring Provider (OT): Dr. Bertis Ruddy   Encounter Date: 11/16/2019   OT End of Session - 11/16/19 1515    Visit Number 6    Number of Visits 25    Date for OT Re-Evaluation 01/23/20    Authorization Type MCR primary, AARP secondary    Authorization Time Period 90 days, may d/c after 8 weeks dep on progress.    Authorization - Visit Number 6    Authorization - Number of Visits 10    Progress Note Due on Visit 10    OT Start Time 1100    OT Stop Time 1145    OT Time Calculation (min) 45 min    Activity Tolerance Patient tolerated treatment well    Behavior During Therapy WFL for tasks assessed/performed           Past Medical History:  Diagnosis Date  . Anxiety   . Arthritis   . ASCVD (arteriosclerotic cardiovascular disease)    03/2004: DES x2 to the RCA; IMI with stent occlusion in 02/2005- suboptimal Plavix compliance  . CAD (coronary artery disease)   . Carcinoma of prostate ([place])    Clopidogrel held for biopsy  . Constipation due to pain medication   . Diabetes mellitus without complication ([place])   . Diabetic neuropathy ([place])   . Headache 2021  . Hyperlipidemia   . Hypertension   . Mild depression ([place])   . Morbid obesity ([place])   . Myocardial infarction ([place])   . Shortness of breath dyspnea   . Sleep apnea    no longer uses cpap  . Stroke ([place]) 2017  . Tobacco abuse    stopped smoking 06/28/09    Past Surgical History:  Procedure Laterality Date  . APPLICATION OF CRANIAL NAVIGATION N/A 07/04/2019   Procedure: APPLICATION OF CRANIAL NAVIGATION;  Surgeon: Ashok Pall, MD;  Location: [facility];  Service: Neurosurgery;  Laterality: N/A;  . BACK SURGERY    . CARDIAC CATHETERIZATION     X  1 stent before having CABG  . CORONARY ARTERY BYPASS GRAFT  06/28/2008   X4  . CRANIOTOMY Right 07/04/2019   Procedure: Right occipital craniotomy for tumor with brainlab;  Surgeon: Ashok Pall, MD;  Location: [facility] [place];  Service: Neurosurgery;  Laterality: Right;  . ENDARTERECTOMY Right 07/03/2015   Procedure: RIGHT CAROTID ENDARTERECTOMY WITH BOVINE PERICARDIUM PATCH ANGIOPLASTY;  Surgeon: Serafina Mitchell, MD;  Location: [facility];  Service: Vascular;  Laterality: Right;  . LUMBAR LAMINECTOMY/DECOMPRESSION MICRODISCECTOMY N/A 09/06/2014   Procedure: LUMBER DECOMPRESSION L3-5;  Surgeon: Melina Schools, MD;  Location: [facility];  Service: Orthopedics;  Laterality: N/A;  . LUMBAR SPINE SURGERY  2002  . PROSTATE BIOPSY    . PROSTATECTOMY  02/2007  . VASECTOMY      There were no vitals filed for this visit.   Subjective Assessment - 11/16/19 1106    Subjective  Denies pain    Patient is accompanied by: Family member    Pertinent History [name] is a 74 y.o. 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, prostate cancer with radiation therapy prostatectomy, CAD with CABG maintained on Xarelto, chronic diastolic congestive heart  failure.  Lives with spouse two-level home he had been receiving outpatient therapies.  Presented 10/08/2019 with increasing left-sided weakness headache nausea vomiting as well as bouts of hematuria.   CT of the head showed new severe vasogenic edema throughout the posterior right cerebral hemisphere with 8 mm leftNo evidence of acute intracranial abnormality.  Follow-up medical oncology maintained on Decadron therapy.    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 No/denies           Pt shown  bilateral sh flexion overhead to perform with paper towel roll - pt required min cues to minimize/prevent compensations. Pt encouraged to do at home.  Pt asked to assemble PVC pipe design with max cues and assist to set up and separate/organize pieces. Pt then asked to copy Fig 1 (simple) with mod cueing t/o task for scanning fully to Lt, differentiating b/t pieces, orienting connector pc's, and for visual memory as pt would often lose place. Pt then completed Fig 2 w/ slightly less cueing required.                        OT Short Term Goals - 11/14/19 1455      OT SHORT TERM GOAL #1   Title Pt to verbalize understanding of visual compensatory strategies to improve daily function including reading and environmental scanning    Time 4    Period Weeks    Status On-going      OT SHORT TERM GOAL #2   Title Pt to perform tabletop scanning with 90% or greater accuracy using strategies    Baseline 85% for 1.5 M number cancellation    Time 4    Period Weeks    Status New      OT SHORT TERM GOAL #3   Title Pt will read 3/4 page of text with accuracy, visual aids/AE and extra time PRN    Time 4    Period Weeks    Status New      OT SHORT TERM GOAL #4   Title Pt will perform dressing with supervision/ set up    Baseline min A for shoes/ socks    Time 4    Period Weeks    Status New      OT SHORT TERM GOAL #5   Title Pt will demonstrate improved LUE fine motor coordination for ADLs as evidenced by decreasing 9 hole peg test score to 52 secs or less.    Baseline RUE 33 secs, LUE 58.25    Time 4    Period Weeks    Status New      OT SHORT TERM GOAL #6   Title Pt will demonstrate ability to retrieve a llightweight object at 130 shoulder flexion with LUE demonstrating good control.    Baseline shoulder flexion RUE 140, LUE 120    Time 4    Period Weeks    Status New             OT Long Term Goals - 10/25/19 0747      OT LONG TERM GOAL #1   Title Pt will  perform environmental scanning at 90% accuracy in mod distracting environment    Time 12    Period Weeks    Status New    Target Date 01/23/20      OT LONG  TERM GOAL #2   Title Pt to read full page of text or greater with visual aids/AE prn    Time 12    Period Weeks    Status New      OT LONG TERM GOAL #3   Title Pt will demonstrate improved fine motor coordination in LUE for ADLS as evidenced by decreasing 9 hole peg test score to 48 secs or less.    Baseline Pt will perform all basic ADLS with distant supervision.    Time 12    Period Weeks    Status New      OT LONG TERM GOAL #4   Title Pt will perform basic home management tasks with supervision demonstrating good safety awareness.    Time 12    Period Weeks    Status New                 Plan - 11/16/19 1516    Clinical Impression Statement Pt is progressing slowly towards goals.  Pt demo decreased visual memory, scanning to Lt side, orientation of shapes relative to others, and decreased problem solving    OT Occupational Profile and History Detailed Assessment- Review of Records and additional review of physical, cognitive, psychosocial history related to current functional performance    Occupational performance deficits (Please refer to evaluation for details): IADL's;Work;Leisure;Social Participation;ADL's    Body Structure / Function / Physical Skills IADL;Endurance;Balance;Vision;ADL;Mobility;Strength;UE functional use;FMC;Coordination;Gait;GMC;ROM;Decreased knowledge of use of DME;Dexterity    Cognitive Skills Memory;Problem Solve;Perception;Thought;Safety Awareness    Rehab Potential Good    Clinical Decision Making Several treatment options, min-mod task modification necessary    Comorbidities Affecting Occupational Performance: May have comorbidities impacting occupational performance    Modification or Assistance to Complete Evaluation  No modification of tasks or assist necessary to complete eval    OT  Frequency 2x / week    OT Duration 12 weeks   may d/c after 8 weeks dep on progress.   OT Treatment/Interventions Self-care/ADL training;Functional Furniture conservator/restorer;Therapeutic activities;Coping strategies training;Visual/perceptual remediation/compensation;Patient/family education;Cognitive remediation/compensation;Therapeutic exercise;Balance training;Manual Therapy;Neuromuscular education;Aquatic Therapy;Energy conservation;DME and/or AE instruction;Paraffin;Fluidtherapy;Gait Training;Passive range of motion;Moist Heat    Plan ADL strategies, continue with coordination    Consulted and Agree with Plan of Care Patient;Family member/caregiver    Family Member Consulted wife           Patient will benefit from skilled therapeutic intervention in order to improve the following deficits and impairments:   Body Structure / Function / Physical Skills: IADL, Endurance, Balance, Vision, ADL, Mobility, Strength, UE functional use, FMC, Coordination, Gait, GMC, ROM, Decreased knowledge of use of DME, Dexterity Cognitive Skills: Memory, Problem Solve, Perception, Thought, Safety Awareness     Visit Diagnosis: Visuospatial deficit  Frontal lobe and executive function deficit  Attention and concentration deficit    Problem List Patient Active Problem List   Diagnosis Date Noted  . 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 Dyslipidemia   . Seizure prophylaxis   . Chronic diastolic congestive heart failure ([place])   . Diabetic peripheral neuropathy ([place])   . Palliative care by specialist   . Weakness 10/08/2019  . Vasogenic edema ([place]) 10/08/2019  . Gross hematuria 10/08/2019  . DNR (do not resuscitate) discussion 07/13/2019  . S/P craniotomy 07/04/2019  . 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 Respiratory failure ([place])   . Encephalopathy acute   . Brain mass 07/01/2019  . Fever 07/01/2019  . Atherosclerosis of native artery of both lower  extremities  with intermittent claudication ([place]) 08/23/2018  . Coronary artery disease without angina pectoris 08/23/2018  . Dyspnea on exertion 08/23/2018  . Biochemically recurrent malignant neoplasm of prostate 08/16/2018  . S/P carotid endarterectomy 08/20/2015  . Essential hypertension 08/20/2015  . Type 2 diabetes mellitus with circulatory disorder ([place]) 08/20/2015  . HLD (hyperlipidemia) 08/20/2015  . Carotid artery stenosis, symptomatic 07/03/2015  . Carotid stenosis, right   . TIA (transient ischemic attack) 06/18/2015  . ASCVD (arteriosclerotic cardiovascular disease) 06/18/2015  . Diabetes mellitus without complication ([place]) 09/38/1829  . Resistant hypertension 06/18/2015  . Hyperlipidemia 06/18/2015  . Spinal stenosis of lumbar region with neurogenic claudication 09/06/2014  . Atherosclerosis of native artery of extremity with intermittent claudication ([place]) 03/09/2013  . HYPERLIPIDEMIA 04/08/2009  . TOBACCO ABUSE 04/08/2009  . ATHEROSCLEROTIC CARDIOVASCULAR DISEASE 04/08/2009  . INTERMITTENT VERTIGO 04/08/2009  . ADENOCARCINOMA, PROSTATE 03/21/2009  . ABDOMINAL PAIN -GENERALIZED 03/21/2009  . History of coronary artery bypass graft 06/28/2008    Carey Bullocks, OTR/L 11/16/2019, 3:17 PM  [facility] [address] [place], [place], [address] Phone: [phone]   Fax:  [phone]  Name: CHANCELER PULLIN MRN: [id number] Date of Birth: Mar 13, 1946

## 2019-11-17 ENCOUNTER — Ambulatory Visit: Payer: Medicare Other

## 2019-11-21 ENCOUNTER — Ambulatory Visit: Payer: Medicare Other

## 2019-11-21 ENCOUNTER — Other Ambulatory Visit: Payer: Self-pay

## 2019-11-21 ENCOUNTER — Ambulatory Visit: Payer: Medicare Other | Attending: Physician Assistant | Admitting: Occupational Therapy

## 2019-11-21 DIAGNOSIS — H53462 Homonymous bilateral field defects, left side: Secondary | ICD-10-CM | POA: Insufficient documentation

## 2019-11-21 DIAGNOSIS — R41844 Frontal lobe and executive function deficit: Secondary | ICD-10-CM | POA: Diagnosis not present

## 2019-11-21 DIAGNOSIS — R2689 Other abnormalities of gait and mobility: Secondary | ICD-10-CM | POA: Diagnosis not present

## 2019-11-21 DIAGNOSIS — R278 Other lack of coordination: Secondary | ICD-10-CM | POA: Diagnosis not present

## 2019-11-21 DIAGNOSIS — R41840 Attention and concentration deficit: Secondary | ICD-10-CM | POA: Insufficient documentation

## 2019-11-21 DIAGNOSIS — R2681 Unsteadiness on feet: Secondary | ICD-10-CM | POA: Insufficient documentation

## 2019-11-21 DIAGNOSIS — R42 Dizziness and giddiness: Secondary | ICD-10-CM

## 2019-11-21 DIAGNOSIS — M6281 Muscle weakness (generalized): Secondary | ICD-10-CM

## 2019-11-21 DIAGNOSIS — R41842 Visuospatial deficit: Secondary | ICD-10-CM | POA: Diagnosis not present

## 2019-11-21 DIAGNOSIS — G9340 Encephalopathy, unspecified: Secondary | ICD-10-CM

## 2019-11-21 DIAGNOSIS — C719 Malignant neoplasm of brain, unspecified: Secondary | ICD-10-CM

## 2019-11-21 NOTE — Therapy (Signed)
[facility] [address] [place], [place], [address] Phone: [phone]   Fax:  [phone]  Physical Therapy Treatment  Patient Details  Name: [name] MRN: [id number] Date of Birth: [birth year] Referring Provider (PT): [name]   Encounter Date: 11/21/2019   PT End of Session - 11/21/19 1131    Visit Number 7    Number of Visits 17    Date for PT Re-Evaluation 12/19/19    Progress Note Due on Visit 10    PT Start Time 1100    PT Stop Time 1145    PT Time Calculation (min) 45 min    Equipment Utilized During Treatment Gait belt    Activity Tolerance Patient tolerated treatment well;Patient limited by fatigue    Behavior During Therapy [facility] for tasks assessed/performed           Past Medical History:  Diagnosis Date  . Anxiety   . Arthritis   . ASCVD (arteriosclerotic cardiovascular disease)    03/2004: DES x2 to the RCA; IMI with stent occlusion in 02/2005- suboptimal Plavix compliance  . CAD (coronary artery disease)   . Carcinoma of prostate ([place])    Clopidogrel held for biopsy  . Constipation due to pain medication   . Diabetes mellitus without complication ([place])   . Diabetic neuropathy ([place])   . Headache 2021  . Hyperlipidemia   . Hypertension   . Mild depression ([place])   . Morbid obesity ([place])   . Myocardial infarction ([place])   . Shortness of breath dyspnea   . Sleep apnea    no longer uses cpap  . Stroke ([place]) 2017  . Tobacco abuse    stopped smoking 06/28/09    Past Surgical History:  Procedure Laterality Date  . APPLICATION OF CRANIAL NAVIGATION N/A 07/04/2019   Procedure: APPLICATION OF CRANIAL NAVIGATION;  Surgeon: Ashok Pall, MD;  Location: [facility];  Service: Neurosurgery;  Laterality: N/A;  . BACK SURGERY    . CARDIAC CATHETERIZATION     X 1 stent before having CABG  . CORONARY ARTERY BYPASS GRAFT  06/28/2008   X4  . CRANIOTOMY Right 07/04/2019   Procedure: Right occipital  craniotomy for tumor with brainlab;  Surgeon: Ashok Pall, MD;  Location: [facility];  Service: Neurosurgery;  Laterality: Right;  . ENDARTERECTOMY Right 07/03/2015   Procedure: RIGHT CAROTID ENDARTERECTOMY WITH BOVINE PERICARDIUM PATCH ANGIOPLASTY;  Surgeon: Serafina Mitchell, MD;  Location: [facility];  Service: Vascular;  Laterality: Right;  . LUMBAR LAMINECTOMY/DECOMPRESSION MICRODISCECTOMY N/A 09/06/2014   Procedure: LUMBER DECOMPRESSION L3-5;  Surgeon: Melina Schools, MD;  Location: [facility];  Service: Orthopedics;  Laterality: N/A;  . LUMBAR SPINE SURGERY  2002  . PROSTATE BIOPSY    . PROSTATECTOMY  02/2007  . VASECTOMY      There were no vitals filed for this visit.   Subjective Assessment - 11/21/19 1126    Subjective Pt rpeorts he is mostly sedenitary at home.    Patient is accompained by: Family member    Pertinent History PMH: Rt occipital craniotomy 07/04/19 secondary to glioblastoma. PMH: Prostate CA, CVA 2017 (no residual deficits), CAD, DM, HTN, hx of 2 lumbar surgeries 2002, 2016 (laminectomies)    Limitations Standing;Walking    How long can you sit comfortably? no issues    How long can you stand comfortably? 5 min    How long can you walk comfortably? 5 min    Patient Stated Goals walk better  Seated Lumbar flexion to improve hip and back flexibility to be able to reach towards his feet to don/doff clothes: 2 x 5 R and L  Sit to stand: 2 x 5 with R UE only and tactile cues for nose over toes.  Discussed different ways to get more involved with ADLs.  Standing with narrow BOS: 2 x 30"  Standing with normal BOS: verbal cues to correct posture and weight shift. Patient has excessive anterior and lateral to R lean. Cues to correct shift and hold standing posture in that position: 2 x 1'                        PT Short Term Goals - 10/24/19 1850      PT SHORT TERM GOAL #1   Title Pt will be able to perform 5x sit to stand under 30 seconds  to improve functional strengh    Baseline Pt unable to perform them (10/24/19)    Time 4    Period Weeks    Status New    Target Date 11/21/19      PT SHORT TERM GOAL #2   Title Patient will be able to ambulate for 15 min without rest with use of his walker to improve walking endurnace    Baseline <5 min (10/24/19)    Time 4    Period Weeks    Status New    Target Date 11/21/19      PT SHORT TERM GOAL #3   Title Pt will demo 4 points improvement on Berg balance scale to improve overall static balance    Baseline 37/56 (10/24/19)    Time 4    Period Weeks    Status New    Target Date 11/21/19             PT Long Term Goals - 10/24/19 1852      PT LONG TERM GOAL #1   Title Pt will demo 47/56 or better on BBS to improve balance and reduce fall risk    Baseline 37/26 (10/24/19)    Time 8    Period Weeks    Status New    Target Date 12/19/19      PT LONG TERM GOAL #2   Title Patient will be able to perform sit to stand in under 25 seconds to improve overall strength    Baseline unable (10/24/19)    Time 8    Period Weeks    Status New    Target Date 12/19/19      PT LONG TERM GOAL #3   Title Pt will be able to ambulate 100' without AD to improve household ambulation without AD    Baseline Pt uses cane in house (Eval)    Time 8    Period Weeks    Status New    Target Date 12/19/19      PT LONG TERM GOAL #4   Title Pt will demo improvement by 0.15 m/s with use of st. cane to improve functional gait velocity    Baseline 0.92m/s with cane, 0.11m/s with rolling walker (eval)    Time 8    Period Weeks    Status New    Target Date 12/19/19                 Plan - 11/21/19 1127    Clinical Impression Statement Today's skilled session was focused on encouraging patient to do more walking and ADLs at  home to improve independence, activity level and reduce caregiver burden on his wife. Patient fatigues quickly and demonstrates anxiety when exercises and acticiites are  chalanged. Pt requires rest brekas to recover.    Personal Factors and Comorbidities Comorbidity 3+    Comorbidities craniotomy secondary to glioblastoma, hx of lumbar surgeries x 2, CABG x 4    Examination-Activity Limitations Squat;Stairs;Locomotion Level;Caring for Others;Carry    Examination-Participation Restrictions Cleaning;Community Activity;Driving;Laundry;Yard Work;Shop    Stability/Clinical Decision Making Unstable/Unpredictable    Rehab Potential Good    PT Frequency 2x / week    PT Duration 8 weeks    PT Treatment/Interventions ADLs/Self Care Home Management;Gait training;Stair training;Functional mobility training;Therapeutic activities;Therapeutic exercise;Balance training;Neuromuscular re-education;Vestibular;Visual/perceptual remediation/compensation    PT Next Visit Plan Reassess goals, practice sit to stand           Patient will benefit from skilled therapeutic intervention in order to improve the following deficits and impairments:  Abnormal gait, Decreased activity tolerance, Decreased balance, Decreased mobility, Decreased endurance, Difficulty walking, Dizziness, Impaired vision/preception  Visit Diagnosis: Muscle weakness (generalized)  Unsteadiness on feet  Other abnormalities of gait and mobility  Dizziness and giddiness  Glioblastoma multiforme (HCC)  Encephalopathy acute     Problem List Patient Active Problem List   Diagnosis Date Noted  . Glioblastoma multiforme ([place]) 10/11/2019  . Dyslipidemia   . Seizure prophylaxis   . Chronic diastolic congestive heart failure ([place])   . Diabetic peripheral neuropathy ([place])   . Palliative care by specialist   . Weakness 10/08/2019  . Vasogenic edema ([place]) 10/08/2019  . Gross hematuria 10/08/2019  . DNR (do not resuscitate) discussion 07/13/2019  . S/P craniotomy 07/04/2019  . 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 Respiratory failure ([place])   . Encephalopathy  acute   . Brain mass 07/01/2019  . Fever 07/01/2019  . Atherosclerosis of native artery of both lower extremities with intermittent claudication ([place]) 08/23/2018  . Coronary artery disease without angina pectoris 08/23/2018  . Dyspnea on exertion 08/23/2018  . Biochemically recurrent malignant neoplasm of prostate 08/16/2018  . S/P carotid endarterectomy 08/20/2015  . Essential hypertension 08/20/2015  . Type 2 diabetes mellitus with circulatory disorder ([place]) 08/20/2015  . HLD (hyperlipidemia) 08/20/2015  . Carotid artery stenosis, symptomatic 07/03/2015  . Carotid stenosis, right   . TIA (transient ischemic attack) 06/18/2015  . ASCVD (arteriosclerotic cardiovascular disease) 06/18/2015  . Diabetes mellitus without complication ([place]) 36/64/4034  . Resistant hypertension 06/18/2015  . Hyperlipidemia 06/18/2015  . Spinal stenosis of lumbar region with neurogenic claudication 09/06/2014  . Atherosclerosis of native artery of extremity with intermittent claudication ([place]) 03/09/2013  . HYPERLIPIDEMIA 04/08/2009  . TOBACCO ABUSE 04/08/2009  . ATHEROSCLEROTIC CARDIOVASCULAR DISEASE 04/08/2009  . INTERMITTENT VERTIGO 04/08/2009  . ADENOCARCINOMA, PROSTATE 03/21/2009  . ABDOMINAL PAIN -GENERALIZED 03/21/2009  . History of coronary artery bypass graft 06/28/2008    Kerrie Pleasure 11/21/2019, 12:53 PM  [facility] [address] [place], [place], [address] Phone: [phone]   Fax:  [phone]  Name: [name] MRN: [id number] Date of Birth: 1945/08/09

## 2019-11-21 NOTE — Therapy (Signed)
[facility] [address] [place], [place], [address] Phone: [phone]   Fax:  [phone]  Occupational Therapy Treatment  Patient Details  Name: [name] MRN: [id number] Date of Birth: 18-Nov-1945 Referring Provider (OT): Dr. Bertis Ruddy   Encounter Date: 11/21/2019   OT End of Session - 11/21/19 1013    Visit Number 7    Number of Visits 25    Date for OT Re-Evaluation 01/23/20    Authorization Type MCR primary, AARP secondary    Authorization Time Period 90 days, may d/c after 8 weeks dep on progress.    Authorization - Visit Number 7    Authorization - Number of Visits 10    Progress Note Due on Visit 10    OT Start Time 1021    OT Stop Time 1100    OT Time Calculation (min) 39 min    Activity Tolerance Patient tolerated treatment well    Behavior During Therapy WFL for tasks assessed/performed           Past Medical History:  Diagnosis Date  . Anxiety   . Arthritis   . ASCVD (arteriosclerotic cardiovascular disease)    03/2004: DES x2 to the RCA; IMI with stent occlusion in 02/2005- suboptimal Plavix compliance  . CAD (coronary artery disease)   . Carcinoma of prostate ([place])    Clopidogrel held for biopsy  . Constipation due to pain medication   . Diabetes mellitus without complication ([place])   . Diabetic neuropathy ([place])   . Headache 2021  . Hyperlipidemia   . Hypertension   . Mild depression ([place])   . Morbid obesity ([place])   . Myocardial infarction ([place])   . Shortness of breath dyspnea   . Sleep apnea    no longer uses cpap  . Stroke ([place]) 2017  . Tobacco abuse    stopped smoking 06/28/09    Past Surgical History:  Procedure Laterality Date  . APPLICATION OF CRANIAL NAVIGATION N/A 07/04/2019   Procedure: APPLICATION OF CRANIAL NAVIGATION;  Surgeon: Ashok Pall, MD;  Location: [facility];  Service: Neurosurgery;  Laterality: N/A;  . BACK SURGERY    . CARDIAC CATHETERIZATION     X 1  stent before having CABG  . CORONARY ARTERY BYPASS GRAFT  06/28/2008   X4  . CRANIOTOMY Right 07/04/2019   Procedure: Right occipital craniotomy for tumor with brainlab;  Surgeon: Ashok Pall, MD;  Location: [facility];  Service: Neurosurgery;  Laterality: Right;  . ENDARTERECTOMY Right 07/03/2015   Procedure: RIGHT CAROTID ENDARTERECTOMY WITH BOVINE PERICARDIUM PATCH ANGIOPLASTY;  Surgeon: Serafina Mitchell, MD;  Location: [facility];  Service: Vascular;  Laterality: Right;  . LUMBAR LAMINECTOMY/DECOMPRESSION MICRODISCECTOMY N/A 09/06/2014   Procedure: LUMBER DECOMPRESSION L3-5;  Surgeon: Melina Schools, MD;  Location: [facility];  Service: Orthopedics;  Laterality: N/A;  . LUMBAR SPINE SURGERY  2002  . PROSTATE BIOPSY    . PROSTATECTOMY  02/2007  . VASECTOMY      There were no vitals filed for this visit.   Subjective Assessment - 11/21/19 1013    Subjective  "I'm lazy today"  Pt reports that he hasn't did his arm exercises due to being "stubborn" but that wife encourages him to.  Pt/wife reports that he is not missing things on L side of plate as bad as before.    Patient is accompanied by: Family member    Pertinent History [name] is a 74 y.o. right-handed male with history  of right occipital glioblastoma status post debulking resection March 2021 with resultant left-sided weakness followed by chemotherapy radiation therapy followed by [name], prostate cancer with radiation therapy prostatectomy, CAD with CABG maintained on Xarelto, chronic diastolic congestive heart failure.  Lives with spouse two-level home he had been receiving outpatient therapies.  Presented 10/08/2019 with increasing left-sided weakness headache nausea vomiting as well as bouts of hematuria.   CT of the head showed new severe vasogenic edema throughout the posterior right cerebral hemisphere with 8 mm leftNo evidence of acute intracranial abnormality.  Follow-up medical oncology maintained on Decadron therapy.     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 No/denies             Sitting, cane exercises for shoulder flex and abduction with min v.c.  Began checking STGs and discussing progress.  Pt reports that he has not tried shoes/socks lately due to wife performing.  Check L shoulder ROM and 9-hole peg test--see below.  Completing Purdue Pegboard for sequencing, and incr coordination with min difficulty/cues and incr time.  Functional reaching in sitting/standing to place clothespins with 1-8lb resist on vertical pole for incr strength, ROM, activity tolerance.  Pt fatigued quickly in standing and needed to sit for remaining.  Incorporated visual scanning to the L to look for specific colors and pt did so without cueing.            OT Short Term Goals - 11/21/19 1035      OT SHORT TERM GOAL #1   Title Pt to verbalize understanding of visual compensatory strategies to improve daily function including reading and environmental scanning    Time 4    Period Weeks    Status On-going      OT SHORT TERM GOAL #2   Title Pt to perform tabletop scanning with 90% or greater accuracy using strategies    Baseline 85% for 1.5 M number cancellation    Time 4    Period Weeks    Status New      OT SHORT TERM GOAL #3   Title Pt will read 3/4 page of text with accuracy, visual aids/AE and extra time PRN    Time 4    Period Weeks    Status New      OT SHORT TERM GOAL #4   Title Pt will perform dressing with supervision/ set up    Baseline min A for shoes/ socks    Time 4    Period Weeks    Status New      OT SHORT TERM GOAL #5   Title Pt will demonstrate improved LUE fine motor coordination for ADLs as evidenced by decreasing 9 hole peg test score to 52 secs or less.    Baseline RUE 33 secs, LUE 58.25    Time 4    Period Weeks    Status On-going   11/21/19:  68.41sec      OT SHORT TERM GOAL #6   Title Pt will demonstrate ability to retrieve a llightweight object at 130 shoulder flexion with LUE demonstrating good control.    Baseline shoulder flexion RUE 140, LUE 120    Time 4    Period Weeks    Status Achieved   11/21/19:  130*            OT Long Term Goals -  10/25/19 0747      OT LONG TERM GOAL #1   Title Pt will perform environmental scanning at 90% accuracy in mod distracting environment    Time 12    Period Weeks    Status New    Target Date 01/23/20      OT LONG TERM GOAL #2   Title Pt to read full page of text or greater with visual aids/AE prn    Time 12    Period Weeks    Status New      OT LONG TERM GOAL #3   Title Pt will demonstrate improved fine motor coordination in LUE for ADLS as evidenced by decreasing 9 hole peg test score to 48 secs or less.    Baseline Pt will perform all basic ADLS with distant supervision.    Time 12    Period Weeks    Status New      OT LONG TERM GOAL #4   Title Pt will perform basic home management tasks with supervision demonstrating good safety awareness.    Time 12    Period Weeks    Status New                 Plan - 11/21/19 1037    Clinical Impression Statement Pt is progressing slowly towards with improving L shoulder ROM, activity tolerance, and visual scanning to the L.    OT Occupational Profile and History Detailed Assessment- Review of Records and additional review of physical, cognitive, psychosocial history related to current functional performance    Occupational performance deficits (Please refer to evaluation for details): IADL's;Work;Leisure;Social Participation;ADL's    Body Structure / Function / Physical Skills IADL;Endurance;Balance;Vision;ADL;Mobility;Strength;UE functional use;FMC;Coordination;Gait;GMC;ROM;Decreased knowledge of use of DME;Dexterity    Cognitive Skills Memory;Problem Solve;Perception;Thought;Safety Awareness    Rehab Potential Good    Clinical  Decision Making Several treatment options, min-mod task modification necessary    Comorbidities Affecting Occupational Performance: May have comorbidities impacting occupational performance    Modification or Assistance to Complete Evaluation  No modification of tasks or assist necessary to complete eval    OT Frequency 2x / week    OT Duration 12 weeks   may d/c after 8 weeks dep on progress.   OT Treatment/Interventions Self-care/ADL training;Functional Furniture conservator/restorer;Therapeutic activities;Coping strategies training;Visual/perceptual remediation/compensation;Patient/family education;Cognitive remediation/compensation;Therapeutic exercise;Balance training;Manual Therapy;Neuromuscular education;Aquatic Therapy;Energy conservation;DME and/or AE instruction;Paraffin;Fluidtherapy;Gait Training;Passive range of motion;Moist Heat    Plan continue checking STGs, strategies for donning/doffing shoes/socks prn    Consulted and Agree with Plan of Care Patient;Family member/caregiver    Family Member Consulted wife           Patient will benefit from skilled therapeutic intervention in order to improve the following deficits and impairments:   Body Structure / Function / Physical Skills: IADL, Endurance, Balance, Vision, ADL, Mobility, Strength, UE functional use, FMC, Coordination, Gait, GMC, ROM, Decreased knowledge of use of DME, Dexterity Cognitive Skills: Memory, Problem Solve, Perception, Thought, Safety Awareness     Visit Diagnosis: Visuospatial deficit  Other lack of coordination  Muscle weakness (generalized)  Unsteadiness on feet  Frontal lobe and executive function deficit  Attention and concentration deficit    Problem List Patient Active Problem List   Diagnosis Date Noted  . Glioblastoma multiforme ([place]) 10/11/2019  . Dyslipidemia   . Seizure prophylaxis   . Chronic diastolic congestive heart failure ([place])   . Diabetic peripheral neuropathy ([place])   . Palliative  care by specialist   . Weakness 10/08/2019  .  Vasogenic edema ([place]) 10/08/2019  . Gross hematuria 10/08/2019  . DNR (do not resuscitate) discussion 07/13/2019  . S/P craniotomy 07/04/2019  . Glioblastoma with isocitrate dehydrogenase gene wildtype ([place]) 07/04/2019  . Respiratory failure ([place])   . Encephalopathy acute   . Brain mass 07/01/2019  . Fever 07/01/2019  . Atherosclerosis of native artery of both lower extremities with intermittent claudication ([place]) 08/23/2018  . Coronary artery disease without angina pectoris 08/23/2018  . Dyspnea on exertion 08/23/2018  . Biochemically recurrent malignant neoplasm of prostate 08/16/2018  . S/P carotid endarterectomy 08/20/2015  . Essential hypertension 08/20/2015  . Type 2 diabetes mellitus with circulatory disorder ([place]) 08/20/2015  . HLD (hyperlipidemia) 08/20/2015  . Carotid artery stenosis, symptomatic 07/03/2015  . Carotid stenosis, right   . TIA (transient ischemic attack) 06/18/2015  . ASCVD (arteriosclerotic cardiovascular disease) 06/18/2015  . Diabetes mellitus without complication ([place]) 15/87/2761  . Resistant hypertension 06/18/2015  . Hyperlipidemia 06/18/2015  . Spinal stenosis of lumbar region with neurogenic claudication 09/06/2014  . Atherosclerosis of native artery of extremity with intermittent claudication ([place]) 03/09/2013  . HYPERLIPIDEMIA 04/08/2009  . TOBACCO ABUSE 04/08/2009  . ATHEROSCLEROTIC CARDIOVASCULAR DISEASE 04/08/2009  . INTERMITTENT VERTIGO 04/08/2009  . ADENOCARCINOMA, PROSTATE 03/21/2009  . ABDOMINAL PAIN -GENERALIZED 03/21/2009  . History of coronary artery bypass graft 06/28/2008    [facility] 11/21/2019, 11:19 AM  [facility] [address] [place], [place], [address] Phone: [phone]   Fax:  [phone]  Name: GEFFREY MICHAELSEN MRN: [id number] Date of Birth: 09/28/1945   Vianne Bulls, OTR/L [facility] [address] [place], [place]  [address] [phone] phone [phone] 11/21/19 11:19 AM

## 2019-11-22 ENCOUNTER — Other Ambulatory Visit: Payer: Self-pay | Admitting: Internal Medicine

## 2019-11-22 DIAGNOSIS — C719 Malignant neoplasm of brain, unspecified: Secondary | ICD-10-CM

## 2019-11-23 DIAGNOSIS — L600 Ingrowing nail: Secondary | ICD-10-CM | POA: Diagnosis not present

## 2019-11-23 NOTE — Telephone Encounter (Signed)
Rx request 

## 2019-11-24 ENCOUNTER — Encounter: Payer: Self-pay | Admitting: Occupational Therapy

## 2019-11-24 ENCOUNTER — Ambulatory Visit: Payer: Medicare Other

## 2019-11-24 ENCOUNTER — Encounter
Payer: Medicare Other | Attending: Physical Medicine and Rehabilitation | Admitting: Physical Medicine and Rehabilitation

## 2019-11-24 ENCOUNTER — Other Ambulatory Visit: Payer: Self-pay

## 2019-11-24 ENCOUNTER — Encounter: Payer: Self-pay | Admitting: Physical Medicine and Rehabilitation

## 2019-11-24 ENCOUNTER — Ambulatory Visit: Payer: Medicare Other | Admitting: Occupational Therapy

## 2019-11-24 ENCOUNTER — Telehealth: Payer: Self-pay | Admitting: *Deleted

## 2019-11-24 VITALS — BP 123/80 | HR 93 | Temp 98.7°F | Ht 74.0 in | Wt 210.0 lb

## 2019-11-24 DIAGNOSIS — R41844 Frontal lobe and executive function deficit: Secondary | ICD-10-CM | POA: Diagnosis not present

## 2019-11-24 DIAGNOSIS — M6281 Muscle weakness (generalized): Secondary | ICD-10-CM | POA: Diagnosis not present

## 2019-11-24 DIAGNOSIS — R2681 Unsteadiness on feet: Secondary | ICD-10-CM | POA: Diagnosis not present

## 2019-11-24 DIAGNOSIS — R278 Other lack of coordination: Secondary | ICD-10-CM | POA: Diagnosis not present

## 2019-11-24 DIAGNOSIS — Z298 Encounter for other specified prophylactic measures: Secondary | ICD-10-CM | POA: Diagnosis not present

## 2019-11-24 DIAGNOSIS — R41842 Visuospatial deficit: Secondary | ICD-10-CM

## 2019-11-24 DIAGNOSIS — C719 Malignant neoplasm of brain, unspecified: Secondary | ICD-10-CM | POA: Diagnosis not present

## 2019-11-24 DIAGNOSIS — G9349 Other encephalopathy: Secondary | ICD-10-CM | POA: Insufficient documentation

## 2019-11-24 DIAGNOSIS — R531 Weakness: Secondary | ICD-10-CM | POA: Diagnosis not present

## 2019-11-24 DIAGNOSIS — R42 Dizziness and giddiness: Secondary | ICD-10-CM

## 2019-11-24 DIAGNOSIS — H53462 Homonymous bilateral field defects, left side: Secondary | ICD-10-CM

## 2019-11-24 DIAGNOSIS — R41840 Attention and concentration deficit: Secondary | ICD-10-CM

## 2019-11-24 DIAGNOSIS — R2689 Other abnormalities of gait and mobility: Secondary | ICD-10-CM

## 2019-11-24 NOTE — Telephone Encounter (Signed)
Please advise patient to stop xarelto indefinitely.  We will address again at our next clinic visit.  Ventura Sellers, MD

## 2019-11-24 NOTE — Telephone Encounter (Signed)
TCT pt's wife and spoke with her. Advised to stop the Xarelto per [name].  Advised that he will discuss this with them at pt's next clinic visit on 11/30/19. Wife voiced understanding

## 2019-11-24 NOTE — Telephone Encounter (Signed)
Received call from pt's wife. She states he is having blood in his urine consistently this week and in previous weeks but at least 3-4 days this week. Pt is on Xarelto. Wife is asking if pt should continue Xarelto or stop.  Please advise

## 2019-11-24 NOTE — Therapy (Signed)
[facility] [address] [place], [place], [address] Phone: [phone]   Fax:  [phone]  Physical Therapy Treatment  Patient Details  Name: [name] MRN: [id number] Date of Birth: [birth year] Referring Provider (PT): [name]   Encounter Date: 11/24/2019   PT End of Session - 11/24/19 1118    Visit Number 8    Number of Visits 17    Date for PT Re-Evaluation 12/19/19    Progress Note Due on Visit 10    PT Start Time 1100    PT Stop Time 1145    PT Time Calculation (min) 45 min    Equipment Utilized During Treatment Gait belt    Activity Tolerance Patient tolerated treatment well;Patient limited by fatigue    Behavior During Therapy [facility] for tasks assessed/performed           Past Medical History:  Diagnosis Date  . Anxiety   . Arthritis   . ASCVD (arteriosclerotic cardiovascular disease)    03/2004: DES x2 to the RCA; IMI with stent occlusion in 02/2005- suboptimal Plavix compliance  . CAD (coronary artery disease)   . Carcinoma of prostate ([place])    Clopidogrel held for biopsy  . Constipation due to pain medication   . Diabetes mellitus without complication ([place])   . Diabetic neuropathy ([place])   . Headache 2021  . Hyperlipidemia   . Hypertension   . Mild depression ([place])   . Morbid obesity ([place])   . Myocardial infarction ([place])   . Shortness of breath dyspnea   . Sleep apnea    no longer uses cpap  . Stroke ([place]) 2017  . Tobacco abuse    stopped smoking 06/28/09    Past Surgical History:  Procedure Laterality Date  . APPLICATION OF CRANIAL NAVIGATION N/A 07/04/2019   Procedure: APPLICATION OF CRANIAL NAVIGATION;  Surgeon: Ashok Pall, MD;  Location: [place];  Service: Neurosurgery;  Laterality: N/A;  . BACK SURGERY    . CARDIAC CATHETERIZATION     X 1 stent before having CABG  . CORONARY ARTERY BYPASS GRAFT  06/28/2008   X4  . CRANIOTOMY Right 07/04/2019   Procedure: Right occipital  craniotomy for tumor with brainlab;  Surgeon: Ashok Pall, MD;  Location: [facility];  Service: Neurosurgery;  Laterality: Right;  . ENDARTERECTOMY Right 07/03/2015   Procedure: RIGHT CAROTID ENDARTERECTOMY WITH BOVINE PERICARDIUM PATCH ANGIOPLASTY;  Surgeon: Serafina Mitchell, MD;  Location: [facility];  Service: Vascular;  Laterality: Right;  . LUMBAR LAMINECTOMY/DECOMPRESSION MICRODISCECTOMY N/A 09/06/2014   Procedure: LUMBER DECOMPRESSION L3-5;  Surgeon: Melina Schools, MD;  Location: [facility];  Service: Orthopedics;  Laterality: N/A;  . LUMBAR SPINE SURGERY  2002  . PROSTATE BIOPSY    . PROSTATECTOMY  02/2007  . VASECTOMY      There were no vitals filed for this visit.   Subjective Assessment - 11/24/19 1110    Subjective I have tried to walk little bit more.    Patient is accompained by: Family member    Pertinent History PMH: Rt occipital craniotomy 07/04/19 secondary to glioblastoma. PMH: Prostate CA, CVA 2017 (no residual deficits), CAD, DM, HTN, hx of 2 lumbar surgeries 2002, 2016 (laminectomies)    Limitations Standing;Walking    How long can you sit comfortably? no issues    How long can you stand comfortably? 5 min    How long can you walk comfortably? 5 min    Patient Stated Goals walk better  tREATMENT SLR (AA), SL hip abduction (AA): 4 x 5, 2lbs Bridge: 4 x 5 Clamshells: 2 x 10, 2lbs Performed BBS                        PT Short Term Goals - 11/24/19 1112      PT SHORT TERM GOAL #1   Title Pt will be able to perform 5x sit to stand under 30 seconds with one UE to improve functional strength from standard chair.    Baseline Pt unable to perform them (10/24/19); unable to without UE support (11/24/19)    Time 4    Period Weeks    Status Revised    Target Date 12/22/19      PT SHORT TERM GOAL #2   Title Patient will be able to ambulate for 10 min without rest with use of his walker to improve walking endurnace    Baseline <5 min (10/24/19)     Time 4    Period Weeks    Status Revised    Target Date 11/21/19      PT SHORT TERM GOAL #3   Title Pt will demo 4 points improvement on Berg balance scale to improve overall static balance    Baseline 37/56 (10/24/19)    Time 4    Period Weeks    Status New    Target Date 11/21/19             PT Long Term Goals - 10/24/19 1852      PT LONG TERM GOAL #1   Title Pt will demo 47/56 or better on BBS to improve balance and reduce fall risk    Baseline 37/26 (10/24/19)    Time 8    Period Weeks    Status New    Target Date 12/19/19      PT LONG TERM GOAL #2   Title Patient will be able to perform sit to stand in under 25 seconds to improve overall strength    Baseline unable (10/24/19)    Time 8    Period Weeks    Status New    Target Date 12/19/19      PT LONG TERM GOAL #3   Title Pt will be able to ambulate 100' without AD to improve household ambulation without AD    Baseline Pt uses cane in house (Eval)    Time 8    Period Weeks    Status New    Target Date 12/19/19      PT LONG TERM GOAL #4   Title Pt will demo improvement by 0.15 m/s with use of st. cane to improve functional gait velocity    Baseline 0.5m/s with cane, 0.37m/s with rolling walker (eval)    Time 8    Period Weeks    Status New    Target Date 12/19/19                 Plan - 11/24/19 1110    Clinical Impression Statement We focused on lighter bed exercises in hopes to conserve energy so patient can still be an active participant at home. Patient tends to fatigue more and someitmes his fatigue last for 1-2 days after therapy. STG goals had to be revised as patient is unable to achieve them at current state.    Personal Factors and Comorbidities Comorbidity 3+    Comorbidities craniotomy secondary to glioblastoma, hx of lumbar surgeries x 2, CABG x 4  Examination-Activity Limitations Squat;Stairs;Locomotion Level;Caring for Others;Carry    Examination-Participation Restrictions  Cleaning;Community Activity;Driving;Laundry;Yard Work;Shop    Stability/Clinical Decision Making Unstable/Unpredictable    Rehab Potential Good    PT Frequency 2x / week    PT Duration 8 weeks    PT Treatment/Interventions ADLs/Self Care Home Management;Gait training;Stair training;Functional mobility training;Therapeutic activities;Therapeutic exercise;Balance training;Neuromuscular re-education;Vestibular;Visual/perceptual remediation/compensation    PT Next Visit Plan Reassess goals, practice sit to stand           Patient will benefit from skilled therapeutic intervention in order to improve the following deficits and impairments:  Abnormal gait, Decreased activity tolerance, Decreased balance, Decreased mobility, Decreased endurance, Difficulty walking, Dizziness, Impaired vision/preception  Visit Diagnosis: Muscle weakness (generalized)  Unsteadiness on feet  Other abnormalities of gait and mobility  Dizziness and giddiness  Glioblastoma multiforme (HCC)     Problem List Patient Active Problem List   Diagnosis Date Noted  . Glioblastoma multiforme ([place]) 10/11/2019  . Dyslipidemia   . Seizure prophylaxis   . Chronic diastolic congestive heart failure ([place])   . Diabetic peripheral neuropathy ([place])   . Palliative care by specialist   . Weakness 10/08/2019  . Vasogenic edema ([place]) 10/08/2019  . Gross hematuria 10/08/2019  . DNR (do not resuscitate) discussion 07/13/2019  . S/P craniotomy 07/04/2019  . 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 Respiratory failure ([place])   . Encephalopathy acute   . Brain mass 07/01/2019  . Fever 07/01/2019  . Atherosclerosis of native artery of both lower extremities with intermittent claudication ([place]) 08/23/2018  . Coronary artery disease without angina pectoris 08/23/2018  . Dyspnea on exertion 08/23/2018  . Biochemically recurrent malignant neoplasm of prostate 08/16/2018  . S/P carotid  endarterectomy 08/20/2015  . Essential hypertension 08/20/2015  . Type 2 diabetes mellitus with circulatory disorder ([place]) 08/20/2015  . HLD (hyperlipidemia) 08/20/2015  . Carotid artery stenosis, symptomatic 07/03/2015  . Carotid stenosis, right   . TIA (transient ischemic attack) 06/18/2015  . ASCVD (arteriosclerotic cardiovascular disease) 06/18/2015  . Diabetes mellitus without complication ([place]) 58/52/7782  . Resistant hypertension 06/18/2015  . Hyperlipidemia 06/18/2015  . Spinal stenosis of lumbar region with neurogenic claudication 09/06/2014  . Atherosclerosis of native artery of extremity with intermittent claudication ([place]) 03/09/2013  . HYPERLIPIDEMIA 04/08/2009  . TOBACCO ABUSE 04/08/2009  . ATHEROSCLEROTIC CARDIOVASCULAR DISEASE 04/08/2009  . INTERMITTENT VERTIGO 04/08/2009  . ADENOCARCINOMA, PROSTATE 03/21/2009  . ABDOMINAL PAIN -GENERALIZED 03/21/2009  . History of coronary artery bypass graft 06/28/2008    Kerrie Pleasure 11/24/2019, 11:21 AM  [facility] [address] [place], [place], [address] Phone: [phone]   Fax:  [phone]  Name: [name] MRN: [id number] Date of Birth: Nov 09, 1945

## 2019-11-24 NOTE — Therapy (Signed)
[facility] [address] [place], [place], [address] Phone: [phone]   Fax:  [phone]  Occupational Therapy Treatment  Patient Details  Name: [name] MRN: [id number] Date of Birth: [birth year] Referring Provider (OT): Dr. Bertis Ruddy   Encounter Date: 11/24/2019   OT End of Session - 11/24/19 1030    Visit Number 8    Number of Visits 25    Date for OT Re-Evaluation 01/23/20    Authorization Type MCR primary, AARP secondary    Authorization Time Period 90 days, may d/c after 8 weeks dep on progress.    Authorization - Visit Number 8    Authorization - Number of Visits 10    Progress Note Due on Visit 10    OT Start Time 1022    OT Stop Time 1100    OT Time Calculation (min) 38 min    Activity Tolerance Patient tolerated treatment well    Behavior During Therapy WFL for tasks assessed/performed           Past Medical History:  Diagnosis Date  . Anxiety   . Arthritis   . ASCVD (arteriosclerotic cardiovascular disease)    03/2004: DES x2 to the RCA; IMI with stent occlusion in 02/2005- suboptimal Plavix compliance  . CAD (coronary artery disease)   . Carcinoma of prostate ([place])    Clopidogrel held for biopsy  . Constipation due to pain medication   . Diabetes mellitus without complication ([place])   . Diabetic neuropathy ([place])   . Headache 2021  . Hyperlipidemia   . Hypertension   . Mild depression ([place])   . Morbid obesity ([place])   . Myocardial infarction ([place])   . Shortness of breath dyspnea   . Sleep apnea    no longer uses cpap  . Stroke ([place]) 2017  . Tobacco abuse    stopped smoking 06/28/09    Past Surgical History:  Procedure Laterality Date  . APPLICATION OF CRANIAL NAVIGATION N/A 07/04/2019   Procedure: APPLICATION OF CRANIAL NAVIGATION;  Surgeon: Ashok Pall, MD;  Location: [facility];  Service: Neurosurgery;  Laterality: N/A;  . BACK SURGERY    . CARDIAC CATHETERIZATION     X 1  stent before having CABG  . CORONARY ARTERY BYPASS GRAFT  06/28/2008   X4  . CRANIOTOMY Right 07/04/2019   Procedure: Right occipital craniotomy for tumor with brainlab;  Surgeon: Ashok Pall, MD;  Location: [facility];  Service: Neurosurgery;  Laterality: Right;  . ENDARTERECTOMY Right 07/03/2015   Procedure: RIGHT CAROTID ENDARTERECTOMY WITH BOVINE PERICARDIUM PATCH ANGIOPLASTY;  Surgeon: Serafina Mitchell, MD;  Location: [facility];  Service: Vascular;  Laterality: Right;  . LUMBAR LAMINECTOMY/DECOMPRESSION MICRODISCECTOMY N/A 09/06/2014   Procedure: LUMBER DECOMPRESSION L3-5;  Surgeon: Melina Schools, MD;  Location: [facility];  Service: Orthopedics;  Laterality: N/A;  . LUMBAR SPINE SURGERY  2002  . PROSTATE BIOPSY    . PROSTATECTOMY  02/2007  . VASECTOMY      There were no vitals filed for this visit.   Subjective Assessment - 11/24/19 1023    Patient is accompanied by: Family member    Pertinent History [name] is a 74 y.o. 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 Mickeal [name], prostate cancer with radiation therapy prostatectomy, CAD with CABG maintained on Xarelto, chronic diastolic congestive heart failure.  Lives with spouse two-level home  he had been receiving outpatient therapies.  Presented 10/08/2019 with increasing left-sided weakness headache nausea vomiting as well as bouts of hematuria.   CT of the head showed new severe vasogenic edema throughout the posterior right cerebral hemisphere with 8 mm leftNo evidence of acute intracranial abnormality.  Follow-up medical oncology maintained on Decadron therapy.    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 No/denies                    Treatment: Therapist checked  progress towards goals. Tabletop number cancellation 1.5 M 28/80 missed. Pt donned and doffed shoes and socks with supervision and min v.c using foot stool Discussed importance of carryover at home, pt is not assisting with dressing consistently            OT Education - 11/24/19 1055    Education Details cane exercises shoulder flexion 10 reps, chest press , shoulder abduction 10 reps each  min v.c, strategies for sonning/ doffing shoes and socks , reviewed vision compensation strategies.   Person(s) Educated Patient;Spouse    Methods Explanation;Demonstration;Verbal cues    Comprehension Verbalized understanding;Returned demonstration;Verbal cues required            OT Short Term Goals - 11/24/19 1043      OT SHORT TERM GOAL #1   Title Pt to verbalize understanding of visual compensatory strategies to improve daily function including reading and environmental scanning    Time 4    Period Weeks    Status Achieved      OT SHORT TERM GOAL #2   Title Pt to perform tabletop scanning with 90% or greater accuracy using strategies    Baseline 85% for 1.5 M number cancellation    Time 4    Period Weeks    Status On-going   28/80 missed     OT SHORT TERM GOAL #3   Title Pt will read 3/4 page of text with accuracy, visual aids/AE and extra time PRN    Time 4    Period Weeks    Status Achieved   met with larger print double spaced and line guide     OT SHORT TERM GOAL #4   Title Pt will perform dressing with supervision/ set up    Baseline min A for shoes/ socks    Time 4    Period Weeks    Status On-going   ongoing, not perfroming at home     OT SHORT TERM GOAL #5   Title Pt will demonstrate improved LUE fine motor coordination for ADLs as evidenced by decreasing 9 hole peg test score to 52 secs or less.    Baseline RUE 33 secs, LUE 58.25    Time 4    Period Weeks    Status On-going   11/21/19:  68.41sec     OT SHORT TERM GOAL #6   Title Pt will demonstrate ability to  retrieve a llightweight object at 130 shoulder flexion with LUE demonstrating good control.    Baseline shoulder flexion RUE 140, LUE 120    Time 4    Period Weeks    Status Achieved   11/21/19:  130*            OT Long Term Goals - 10/25/19 0747      OT LONG TERM GOAL #1   Title Pt  will perform environmental scanning at 90% accuracy in mod distracting environment    Time 12    Period Weeks    Status New    Target Date 01/23/20      OT LONG TERM GOAL #2   Title Pt to read full page of text or greater with visual aids/AE prn    Time 12    Period Weeks    Status New      OT LONG TERM GOAL #3   Title Pt will demonstrate improved fine motor coordination in LUE for ADLS as evidenced by decreasing 9 hole peg test score to 48 secs or less.    Baseline Pt will perform all basic ADLS with distant supervision.    Time 12    Period Weeks    Status New      OT LONG TERM GOAL #4   Title Pt will perform basic home management tasks with supervision demonstrating good safety awareness.    Time 12    Period Weeks    Status New                 Plan - 11/24/19 1118    Clinical Impression Statement Pt demonstrates progress towards goals however cognitve and visual perceptual deficits impede pt progress.    OT Occupational Profile and History Detailed Assessment- Review of Records and additional review of physical, cognitive, psychosocial history related to current functional performance    Occupational performance deficits (Please refer to evaluation for details): IADL's;Work;Leisure;Social Participation;ADL's    Body Structure / Function / Physical Skills IADL;Endurance;Balance;Vision;ADL;Mobility;Strength;UE functional use;FMC;Coordination;Gait;GMC;ROM;Decreased knowledge of use of DME;Dexterity    Cognitive Skills Memory;Problem Solve;Perception;Thought;Safety Awareness    Rehab Potential Good    Clinical Decision Making Several treatment options, min-mod task modification  necessary    Comorbidities Affecting Occupational Performance: May have comorbidities impacting occupational performance    Modification or Assistance to Complete Evaluation  No modification of tasks or assist necessary to complete eval    OT Frequency 2x / week    OT Duration 12 weeks   may d/c after 8 weeks dep on progress.   OT Treatment/Interventions Self-care/ADL training;Functional Furniture conservator/restorer;Therapeutic activities;Coping strategies training;Visual/perceptual remediation/compensation;Patient/family education;Cognitive remediation/compensation;Therapeutic exercise;Balance training;Manual Therapy;Neuromuscular education;Aquatic Therapy;Energy conservation;DME and/or AE instruction;Paraffin;Fluidtherapy;Gait Training;Passive range of motion;Moist Heat    Plan work towards unmet goals, simple functional activity with a visual and cognitve component    Consulted and Agree with Plan of Care Patient;Family member/caregiver    Family Member Consulted wife           Patient will benefit from skilled therapeutic intervention in order to improve the following deficits and impairments:   Body Structure / Function / Physical Skills: IADL, Endurance, Balance, Vision, ADL, Mobility, Strength, UE functional use, FMC, Coordination, Gait, GMC, ROM, Decreased knowledge of use of DME, Dexterity Cognitive Skills: Memory, Problem Solve, Perception, Thought, Safety Awareness     Visit Diagnosis: Muscle weakness (generalized)  Visuospatial deficit  Other lack of coordination  Frontal lobe and executive function deficit  Homonymous hemianopsia, left  Attention and concentration deficit    Problem List Patient Active Problem List   Diagnosis Date Noted  . Glioblastoma multiforme ([place]) 10/11/2019  . Dyslipidemia   . Seizure prophylaxis   . Chronic diastolic congestive heart failure ([place])   . Diabetic peripheral neuropathy ([place])   . Palliative care by specialist   . Weakness 10/08/2019    . Vasogenic edema ([place]) 10/08/2019  . Gross hematuria 10/08/2019  . DNR (do  not resuscitate) discussion 07/13/2019  . S/P craniotomy 07/04/2019  . 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 Respiratory failure ([place])   . Encephalopathy acute   . Brain mass 07/01/2019  . Fever 07/01/2019  . Atherosclerosis of native artery of both lower extremities with intermittent claudication ([place]) 08/23/2018  . Coronary artery disease without angina pectoris 08/23/2018  . Dyspnea on exertion 08/23/2018  . Biochemically recurrent malignant neoplasm of prostate 08/16/2018  . S/P carotid endarterectomy 08/20/2015  . Essential hypertension 08/20/2015  . Type 2 diabetes mellitus with circulatory disorder ([place]) 08/20/2015  . HLD (hyperlipidemia) 08/20/2015  . Carotid artery stenosis, symptomatic 07/03/2015  . Carotid stenosis, right   . TIA (transient ischemic attack) 06/18/2015  . ASCVD (arteriosclerotic cardiovascular disease) 06/18/2015  . Diabetes mellitus without complication ([place]) 09/81/1914  . Resistant hypertension 06/18/2015  . Hyperlipidemia 06/18/2015  . Spinal stenosis of lumbar region with neurogenic claudication 09/06/2014  . Atherosclerosis of native artery of extremity with intermittent claudication ([place]) 03/09/2013  . HYPERLIPIDEMIA 04/08/2009  . TOBACCO ABUSE 04/08/2009  . ATHEROSCLEROTIC CARDIOVASCULAR DISEASE 04/08/2009  . INTERMITTENT VERTIGO 04/08/2009  . ADENOCARCINOMA, PROSTATE 03/21/2009  . ABDOMINAL PAIN -GENERALIZED 03/21/2009  . History of coronary artery bypass graft 06/28/2008    , 11/24/2019, 11:20 AM Theone Murdoch, OTR/L [phone] Phone: [phone] 11:22 AM 11/24/19 [facility] [address] [place], [place], [address] Phone: [phone]   Fax:  [phone]  Name: [name] MRN: [id number] Date of Birth: Jul 20, 1945

## 2019-11-24 NOTE — Progress Notes (Signed)
Subjective:    Patient ID: [name], male    DOB: [birth year], 74 y.o.   MRN: [id number]  HPI  [name] is a 74 year old man who presents for hospital follow-up after CIR admission for encephalopathy.   Receiving outpatient therapy and working on strengthening and ambulating.   He has not had an annual with Dr. Justin Mend yet. Does not require any refills of his medications.   [name] cut the steroids in half. Appetite increased with the steroids.   Off the Xarelto because of blood in the urine.   Pain is 0/10.  He and his wife are trying to work in increasing his walking. In the days he does to PT he does more activity. He is able to do 12 steps in the house.   Has some constipation and is taking Mrialax.   Has Zofran for nausea he experiences when taking Temodar.   Pain Inventory Average Pain 0 Pain Right Now 0 My pain is no pain  In the last 24 hours, has pain interfered with the following? General activity 0 Relation with others 0 Enjoyment of life 0 What TIME of day is your pain at its worst? no pain Sleep (in general) Good  Pain is worse with: no pain Pain improves with: no pain Relief from Meds: no pain  Mobility walk with assistance use a walker ability to climb steps?  yes do you drive?  no  Function retired  Neuro/Psych weakness trouble walking  Prior Studies Any changes since last visit?  no  Physicians involved in your care Any changes since last visit?  no   Family History  Problem Relation Age of Onset  . Kidney disease Mother   . Stroke Father   . Breast cancer Sister        breast progressed into bone  . Colon cancer Neg Hx   . Prostate cancer Neg Hx   . Pancreatic cancer Neg Hx    Social History   Socioeconomic History  . Marital status: Married    Spouse name: Not on file  . Number of children: 4  . Years of education: Not on file  . Highest education level: Not on file  Occupational History  . Occupation:  Technical brewer    Comment: retired  . Occupation: Designer, industrial/product: [employer]    Comment: full time  Tobacco Use  . Smoking status: Former Smoker    Packs/day: 1.00    Years: 40.00    Pack years: 40.00    Types: Cigarettes    Quit date: 06/28/2008    Years since quitting: 11.4  . Smokeless tobacco: Former Systems developer    Quit date: 06/28/2009  Vaping Use  . Vaping Use: Never used  Substance and Sexual Activity  . Alcohol use: Yes    Alcohol/week: 3.0 standard drinks    Types: 2 Glasses of wine, 1 Cans of beer per week    Comment: may have 1 drink a week  . Drug use: No  . Sexual activity: Not on file  Other Topics Concern  . Not on file  Social History Narrative   Married with 3 sons and 1 daughter   Daily caffeine use, 3 per day   Social Determinants of Health   Financial Resource Strain:   . Difficulty of Paying Living Expenses:   Food Insecurity:   . Worried About Charity fundraiser in the Last Year:   . Ran  Out of Food in the Last Year:   Transportation Needs:   . Lack of Transportation (Medical):   Marland Kitchen Lack of Transportation (Non-Medical):   Physical Activity:   . Days of Exercise per Week:   . Minutes of Exercise per Session:   Stress:   . Feeling of Stress :   Social Connections:   . Frequency of Communication with Friends and Family:   . Frequency of Social Gatherings with Friends and Family:   . Attends Religious Services:   . Active Member of Clubs or Organizations:   . Attends Archivist Meetings:   Marland Kitchen Marital Status:    Past Surgical History:  Procedure Laterality Date  . APPLICATION OF CRANIAL NAVIGATION N/A 07/04/2019   Procedure: APPLICATION OF CRANIAL NAVIGATION;  Surgeon: Ashok Pall, MD;  Location: [facility];  Service: Neurosurgery;  Laterality: N/A;  . BACK SURGERY    . CARDIAC CATHETERIZATION     X 1 stent before having CABG  . CORONARY ARTERY BYPASS GRAFT  06/28/2008   X4  . CRANIOTOMY Right 07/04/2019   Procedure:  Right occipital craniotomy for tumor with brainlab;  Surgeon: Ashok Pall, MD;  Location: [facility];  Service: Neurosurgery;  Laterality: Right;  . ENDARTERECTOMY Right 07/03/2015   Procedure: RIGHT CAROTID ENDARTERECTOMY WITH BOVINE PERICARDIUM PATCH ANGIOPLASTY;  Surgeon: Serafina Mitchell, MD;  Location: [facility];  Service: Vascular;  Laterality: Right;  . LUMBAR LAMINECTOMY/DECOMPRESSION MICRODISCECTOMY N/A 09/06/2014   Procedure: LUMBER DECOMPRESSION L3-5;  Surgeon: Melina Schools, MD;  Location: [facility];  Service: Orthopedics;  Laterality: N/A;  . LUMBAR SPINE SURGERY  2002  . PROSTATE BIOPSY    . PROSTATECTOMY  02/2007  . VASECTOMY     Past Medical History:  Diagnosis Date  . Anxiety   . Arthritis   . ASCVD (arteriosclerotic cardiovascular disease)    03/2004: DES x2 to the RCA; IMI with stent occlusion in 02/2005- suboptimal Plavix compliance  . CAD (coronary artery disease)   . Carcinoma of prostate ([facility])    Clopidogrel held for biopsy  . Constipation due to pain medication   . Diabetes mellitus without complication ([facility])   . Diabetic neuropathy ([facility])   . Headache 2021  . Hyperlipidemia   . Hypertension   . Mild depression ([place])   . Morbid obesity ([place])   . Myocardial infarction ([facility])   . Shortness of breath dyspnea   . Sleep apnea    no longer uses cpap  . Stroke ([facility]) 2017  . Tobacco abuse    stopped smoking 06/28/09   BP 123/80   Pulse 93   Temp 98.7 F (37.1 C)   Ht _0  (1.88 m)   Wt 210 lb (95.3 kg)   SpO2 96%   BMI 26.96 kg/m   Opioid Risk Score:   Fall Risk Score:  `1  Depression screen PHQ 2/9  Depression screen PHQ 2/9 11/24/2019  Decreased Interest 3  Down, Depressed, Hopeless 0  PHQ - 2 Score 3  Altered sleeping 0  Tired, decreased energy 2  Change in appetite 0  Feeling bad or failure about yourself  0  Trouble concentrating 3  Moving slowly or fidgety/restless 2  Suicidal thoughts 0  PHQ-9 Score 10  Difficult doing work/chores Somewhat difficult    Some recent data might be hidden    Review of Systems  Constitutional: Negative.   HENT: Negative.   Eyes: Negative.   Respiratory: Negative.   Cardiovascular: Negative.   Gastrointestinal: Positive for  constipation.  Endocrine: Negative.   Genitourinary: Negative.   Musculoskeletal: Positive for gait problem.  Skin: Negative.   Allergic/Immunologic: Negative.   Neurological: Positive for weakness.  Psychiatric/Behavioral: Negative.   All other systems reviewed and are negative.      Objective:   Physical Exam Gen: no distress, normal appearing HEENT: oral mucosa pink and moist, NCAT Cardio: Reg rate Chest: normal effort, normal rate of breathing Abd: soft, non-distended Ext: no edema Skin: intact Neuro: Alert and oriented x3. Cognition intact. Musculoskeletal: 5/5 strength throughout. Ambulating with RW, slow gait. Thoracic kyphosis. Psych: pleasant, normal affect     Assessment & Plan:  [name] is a 74 year old man who presents for CIR admission after encephalopathy.  Impaired mobility and ADLs: -progressing well with outpatient therapy. -discussed goal to increase exercise to 30 minutes per day to maintain muscle mass and for its myriad benefits. Discussed that this can consist of both walking and light resistance training and patient and wife are agreeable.  Constipation: -Can continue Miralax. Provided guidance regarding beneficial foods.   All medications reviewed and he is taking them as prescribed. He has appropriate follow-up and does not require any refills. Cognition is much improved, strength and endurance continue to improve. Denies nausea or pain. Has excellent support from his wife.  All questions answered. He may follow-up with [place] as needed.

## 2019-11-26 ENCOUNTER — Encounter: Payer: Self-pay | Admitting: Physical Medicine and Rehabilitation

## 2019-11-26 NOTE — Progress Notes (Signed)
  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  The patient then received a 14 Gy boost to yield a final dose of 60 Gy.  Narrative: The patient tolerated radiation treatment relatively well.     Plan: The patient has completed radiation treatment. The patient will return to radiation oncology clinic for routine followup in one month. I advised the patient to call or return sooner if they have any questions or concerns related to their recovery or treatment. ________________________________  Jodelle Gross, M.D., Ph.D.

## 2019-11-27 ENCOUNTER — Ambulatory Visit: Payer: Medicare Other | Admitting: Occupational Therapy

## 2019-11-27 ENCOUNTER — Other Ambulatory Visit: Payer: Self-pay

## 2019-11-27 ENCOUNTER — Ambulatory Visit: Payer: Medicare Other

## 2019-11-27 DIAGNOSIS — R2681 Unsteadiness on feet: Secondary | ICD-10-CM | POA: Diagnosis not present

## 2019-11-27 DIAGNOSIS — M6281 Muscle weakness (generalized): Secondary | ICD-10-CM | POA: Diagnosis not present

## 2019-11-27 DIAGNOSIS — R41842 Visuospatial deficit: Secondary | ICD-10-CM

## 2019-11-27 DIAGNOSIS — R41840 Attention and concentration deficit: Secondary | ICD-10-CM | POA: Diagnosis not present

## 2019-11-27 DIAGNOSIS — C719 Malignant neoplasm of brain, unspecified: Secondary | ICD-10-CM

## 2019-11-27 DIAGNOSIS — R278 Other lack of coordination: Secondary | ICD-10-CM | POA: Diagnosis not present

## 2019-11-27 DIAGNOSIS — R41844 Frontal lobe and executive function deficit: Secondary | ICD-10-CM | POA: Diagnosis not present

## 2019-11-27 DIAGNOSIS — R42 Dizziness and giddiness: Secondary | ICD-10-CM

## 2019-11-27 DIAGNOSIS — R2689 Other abnormalities of gait and mobility: Secondary | ICD-10-CM

## 2019-11-27 NOTE — Therapy (Signed)
[facility] [address] [place], [place], [address] Phone: [phone]   Fax:  [phone]  Occupational Therapy Treatment  Patient Details  Name: [name] MRN: [id number] Date of Birth: 1945/07/09 Referring Provider (OT): Dr. Bertis Ruddy   Encounter Date: 11/27/2019   OT End of Session - 11/27/19 1417    Visit Number 9    Number of Visits 25    Date for OT Re-Evaluation 01/23/20    Authorization Type MCR primary, AARP secondary    Authorization Time Period 90 days, may d/c after 8 weeks dep on progress.    Authorization - Visit Number 9    Authorization - Number of Visits 10    Progress Note Due on Visit 10    OT Start Time 1230    OT Stop Time 1315    OT Time Calculation (min) 45 min    Activity Tolerance Patient tolerated treatment well    Behavior During Therapy WFL for tasks assessed/performed           Past Medical History:  Diagnosis Date  . Anxiety   . Arthritis   . ASCVD (arteriosclerotic cardiovascular disease)    03/2004: DES x2 to the RCA; IMI with stent occlusion in 02/2005- suboptimal Plavix compliance  . CAD (coronary artery disease)   . Carcinoma of prostate ([facility])    Clopidogrel held for biopsy  . Constipation due to pain medication   . Diabetes mellitus without complication ([place])   . Diabetic neuropathy ([facility])   . Headache 2021  . Hyperlipidemia   . Hypertension   . Mild depression ([place] [place])   . Morbid obesity ([facility])   . Myocardial infarction ([facility])   . Shortness of breath dyspnea   . Sleep apnea    no longer uses cpap  . Stroke ([facility]) 2017  . Tobacco abuse    stopped smoking 06/28/09    Past Surgical History:  Procedure Laterality Date  . APPLICATION OF CRANIAL NAVIGATION N/A 07/04/2019   Procedure: APPLICATION OF CRANIAL NAVIGATION;  Surgeon: Ashok Pall, MD;  Location: [facility];  Service: Neurosurgery;  Laterality: N/A;  . BACK SURGERY    . CARDIAC CATHETERIZATION     X 1  stent before having CABG  . CORONARY ARTERY BYPASS GRAFT  06/28/2008   X4  . CRANIOTOMY Right 07/04/2019   Procedure: Right occipital craniotomy for tumor with brainlab;  Surgeon: Ashok Pall, MD;  Location: [facility];  Service: Neurosurgery;  Laterality: Right;  . ENDARTERECTOMY Right 07/03/2015   Procedure: RIGHT CAROTID ENDARTERECTOMY WITH BOVINE PERICARDIUM PATCH ANGIOPLASTY;  Surgeon: Serafina Mitchell, MD;  Location: [facility];  Service: Vascular;  Laterality: Right;  . LUMBAR LAMINECTOMY/DECOMPRESSION MICRODISCECTOMY N/A 09/06/2014   Procedure: LUMBER DECOMPRESSION L3-5;  Surgeon: Melina Schools, MD;  Location: [facility];  Service: Orthopedics;  Laterality: N/A;  . LUMBAR SPINE SURGERY  2002  . PROSTATE BIOPSY    . PROSTATECTOMY  02/2007  . VASECTOMY      There were no vitals filed for this visit.   Subjective Assessment - 11/27/19 1237    Subjective  I think things are going pretty well. Pt starts chemo again 12/04/19 for 5 days    Patient is accompanied by: Family member    Pertinent History [name] is a 74 y.o. 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, prostate cancer with  radiation therapy prostatectomy, CAD with CABG maintained on Xarelto, chronic diastolic congestive heart failure.  Lives with spouse two-level home he had been receiving outpatient therapies.  Presented 10/08/2019 with increasing left-sided weakness headache nausea vomiting as well as bouts of hematuria.   CT of the head showed new severe vasogenic edema throughout the posterior right cerebral hemisphere with 8 mm leftNo evidence of acute intracranial abnormality.  Follow-up medical oncology maintained on Decadron therapy.    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 No/denies          Pt copying peg design with small pegs Lt hand for coordination and visual/perceptual skills w/ max cues and assist required - pt even demo difficulty following straight line with pegs and often lost place.  Pt counting the number of times the double number appears on each line (49M print size) with use of line guide and vertical line to separate intended # from the rest of the line. Pt often needed help with line guide but did entire page correctly with these modifications and circling the numbers (to help w/ decreased memory)                         OT Short Term Goals - 11/24/19 1043      OT SHORT TERM GOAL #1   Title Pt to verbalize understanding of visual compensatory strategies to improve daily function including reading and environmental scanning    Time 4    Period Weeks    Status Achieved      OT SHORT TERM GOAL #2   Title Pt to perform tabletop scanning with 90% or greater accuracy using strategies    Baseline 85% for 1.5 M number cancellation    Time 4    Period Weeks    Status On-going   28/80 missed     OT SHORT TERM GOAL #3   Title Pt will read 3/4 page of text with accuracy, visual aids/AE and extra time PRN    Time 4    Period Weeks    Status Achieved   met with larger print double spaced and line guide     OT SHORT TERM GOAL #4   Title Pt will perform dressing with supervision/ set up    Baseline min A for shoes/ socks    Time 4    Period Weeks    Status On-going   ongoing, not perfroming at home     OT SHORT TERM GOAL #5   Title Pt will demonstrate improved LUE fine motor coordination for ADLs as evidenced by decreasing 9 hole peg test score to 52 secs or less.    Baseline RUE 33 secs, LUE 58.25    Time 4    Period Weeks    Status On-going   11/21/19:  68.41sec     OT SHORT TERM GOAL #6   Title Pt will demonstrate ability to retrieve a llightweight object at 130 shoulder flexion with LUE  demonstrating good control.    Baseline shoulder flexion RUE 140, LUE 120    Time 4    Period Weeks    Status Achieved   11/21/19:  130*            OT Long Term Goals - 10/25/19 0747      OT LONG TERM  GOAL #1   Title Pt will perform environmental scanning at 90% accuracy in mod distracting environment    Time 12    Period Weeks    Status New    Target Date 01/23/20      OT LONG TERM GOAL #2   Title Pt to read full page of text or greater with visual aids/AE prn    Time 12    Period Weeks    Status New      OT LONG TERM GOAL #3   Title Pt will demonstrate improved fine motor coordination in LUE for ADLS as evidenced by decreasing 9 hole peg test score to 48 secs or less.    Baseline Pt will perform all basic ADLS with distant supervision.    Time 12    Period Weeks    Status New      OT LONG TERM GOAL #4   Title Pt will perform basic home management tasks with supervision demonstrating good safety awareness.    Time 12    Period Weeks    Status New                 Plan - 11/27/19 1418    Clinical Impression Statement Pt demonstrates progress towards goals however cognitve and visual perceptual deficits impede pt progress.    OT Occupational Profile and History Detailed Assessment- Review of Records and additional review of physical, cognitive, psychosocial history related to current functional performance    Occupational performance deficits (Please refer to evaluation for details): IADL's;Work;Leisure;Social Participation;ADL's    Body Structure / Function / Physical Skills IADL;Endurance;Balance;Vision;ADL;Mobility;Strength;UE functional use;FMC;Coordination;Gait;GMC;ROM;Decreased knowledge of use of DME;Dexterity    Cognitive Skills Memory;Problem Solve;Perception;Thought;Safety Awareness    Rehab Potential Good    Clinical Decision Making Several treatment options, min-mod task modification necessary    Comorbidities Affecting Occupational Performance: May  have comorbidities impacting occupational performance    Modification or Assistance to Complete Evaluation  No modification of tasks or assist necessary to complete eval    OT Frequency 2x / week    OT Duration 12 weeks   may d/c after 8 weeks dep on progress.   OT Treatment/Interventions Self-care/ADL training;Functional Development worker, community;Therapeutic activities;Coping strategies training;Visual/perceptual remediation/compensation;Patient/family education;Cognitive remediation/compensation;Therapeutic exercise;Balance training;Manual Therapy;Neuromuscular education;Aquatic Therapy;Energy conservation;DME and/or AE instruction;Paraffin;Fluidtherapy;Gait Training;Passive range of motion;Moist Heat    Plan 10TH PROGRESS NOTE, work towards unmet goals, simple functional activity with a visual and cognitve component    Consulted and Agree with Plan of Care Patient;Family member/caregiver    Family Member Consulted wife           Patient will benefit from skilled therapeutic intervention in order to improve the following deficits and impairments:   Body Structure / Function / Physical Skills: IADL, Endurance, Balance, Vision, ADL, Mobility, Strength, UE functional use, FMC, Coordination, Gait, GMC, ROM, Decreased knowledge of use of DME, Dexterity Cognitive Skills: Memory, Problem Solve, Perception, Thought, Safety Awareness     Visit Diagnosis: Visuospatial deficit  Attention and concentration deficit    Problem List Patient Active Problem List   Diagnosis Date Noted  . Glioblastoma multiforme (HCC) 10/11/2019  . Dyslipidemia   . Seizure prophylaxis   . Chronic diastolic congestive heart failure (HCC)   . Diabetic peripheral neuropathy (HCC)   . Palliative care by specialist   . Weakness 10/08/2019  . Vasogenic edema (HCC) 10/08/2019  . Gross hematuria 10/08/2019  . DNR (do not resuscitate) discussion 07/13/2019  . S/P craniotomy 07/04/2019  . Glioblastoma with  isocitrate  dehydrogenase gene wildtype ([facility]) 07/04/2019  . Respiratory failure ([place])   . Encephalopathy acute   . Brain mass 07/01/2019  . Fever 07/01/2019  . 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 Coronary artery disease without angina pectoris 08/23/2018  . Dyspnea on exertion 08/23/2018  . Biochemically recurrent malignant neoplasm of prostate 08/16/2018  . S/P carotid endarterectomy 08/20/2015  . Essential hypertension 08/20/2015  . Type 2 diabetes mellitus with circulatory disorder ([place]) 08/20/2015  . HLD (hyperlipidemia) 08/20/2015  . Carotid artery stenosis, symptomatic 07/03/2015  . Carotid stenosis, right   . TIA (transient ischemic attack) 06/18/2015  . ASCVD (arteriosclerotic cardiovascular disease) 06/18/2015  . Diabetes mellitus without complication ([place]) 09/81/1914  . Resistant hypertension 06/18/2015  . Hyperlipidemia 06/18/2015  . Spinal stenosis of lumbar region with neurogenic claudication 09/06/2014  . Atherosclerosis of native artery of extremity with intermittent claudication ([facility]) 03/09/2013  . HYPERLIPIDEMIA 04/08/2009  . TOBACCO ABUSE 04/08/2009  . ATHEROSCLEROTIC CARDIOVASCULAR DISEASE 04/08/2009  . INTERMITTENT VERTIGO 04/08/2009  . ADENOCARCINOMA, PROSTATE 03/21/2009  . ABDOMINAL PAIN -GENERALIZED 03/21/2009  . History of coronary artery bypass graft 06/28/2008    Carey Bullocks, OTR/L 11/27/2019, 2:19 PM  [facility] [address] [place], [place], [address] Phone: [phone]   Fax:  [phone]  Name: KEEFE ZAWISTOWSKI MRN: [id number] Date of Birth: [birth year]

## 2019-11-27 NOTE — Therapy (Signed)
[facility] [address] [name], [place], [address] Phone: [phone]   Fax:  [phone]  Physical Therapy Treatment  Patient Details  Name: [name] MRN: [id number] Date of Birth: Jun 28, 1945 Referring Provider (PT): [name]   Encounter Date: 11/27/2019   PT End of Session - 11/27/19 1334    Visit Number 9    Number of Visits 17    Date for PT Re-Evaluation 12/19/19    Progress Note Due on Visit 10    PT Start Time 1315    PT Stop Time 1400    PT Time Calculation (min) 45 min    Equipment Utilized During Treatment Gait belt    Activity Tolerance Patient tolerated treatment well;Patient limited by fatigue    Behavior During Therapy [facility] for tasks assessed/performed           Past Medical History:  Diagnosis Date  . Anxiety   . Arthritis   . ASCVD (arteriosclerotic cardiovascular disease)    03/2004: DES x2 to the RCA; IMI with stent occlusion in 02/2005- suboptimal Plavix compliance  . CAD (coronary artery disease)   . Carcinoma of prostate ([place])    Clopidogrel held for biopsy  . Constipation due to pain medication   . Diabetes mellitus without complication ([place])   . Diabetic neuropathy ([place])   . Headache 2021  . Hyperlipidemia   . Hypertension   . Mild depression ([place])   . Morbid obesity ([place])   . Myocardial infarction ([place])   . Shortness of breath dyspnea   . Sleep apnea    no longer uses cpap  . Stroke ([place]) 2017  . Tobacco abuse    stopped smoking 06/28/09    Past Surgical History:  Procedure Laterality Date  . APPLICATION OF CRANIAL NAVIGATION N/A 07/04/2019   Procedure: APPLICATION OF CRANIAL NAVIGATION;  Surgeon: Ashok Pall, MD;  Location: [facility];  Service: Neurosurgery;  Laterality: N/A;  . BACK SURGERY    . CARDIAC CATHETERIZATION     X 1 stent before having CABG  . CORONARY ARTERY BYPASS GRAFT  06/28/2008   X4  . CRANIOTOMY Right 07/04/2019   Procedure: Right occipital  craniotomy for tumor with brainlab;  Surgeon: Ashok Pall, MD;  Location: [facility];  Service: Neurosurgery;  Laterality: Right;  . ENDARTERECTOMY Right 07/03/2015   Procedure: RIGHT CAROTID ENDARTERECTOMY WITH BOVINE PERICARDIUM PATCH ANGIOPLASTY;  Surgeon: Serafina Mitchell, MD;  Location: [facility];  Service: Vascular;  Laterality: Right;  . LUMBAR LAMINECTOMY/DECOMPRESSION MICRODISCECTOMY N/A 09/06/2014   Procedure: LUMBER DECOMPRESSION L3-5;  Surgeon: Melina Schools, MD;  Location: [facility];  Service: Orthopedics;  Laterality: N/A;  . LUMBAR SPINE SURGERY  2002  . PROSTATE BIOPSY    . PROSTATECTOMY  02/2007  . VASECTOMY      There were no vitals filed for this visit.   Subjective Assessment - 11/27/19 1322    Subjective We have walker for upstairs and downstairs now.    Patient is accompained by: Family member    Pertinent History PMH: Rt occipital craniotomy 07/04/19 secondary to glioblastoma. PMH: Prostate CA, CVA 2017 (no residual deficits), CAD, DM, HTN, hx of 2 lumbar surgeries 2002, 2016 (laminectomies)    Limitations Standing;Walking    How long can you sit comfortably? no issues    How long can you stand comfortably? 5 min    How long can you walk comfortably? 5 min    Patient Stated Goals walk better  SLR, Sl hip abduction: 4 x 5 R and L, 3lbs Bridge: 2 x 10 SL clamshells: 3lbs 2 x 10 R and L Sit to L SL: CGA with verbal cueing for sequencing Supine to sidelying bil: 1x SBA L SL to sit: CGA: 1x with verbal cueing for sequencing Sit to stand: 2 x 5 bil HHA, pt able to stabilize independently when standing Bil leg press: 90lbs AA 2x, 70lbs 2x, 50lbs 10x Gait training: 1 x 115' no AD, cues for picking up his feet more and scanning the environment ahead and in front of him, CGA                           PT Short Term Goals - 11/24/19 1112      PT SHORT TERM GOAL #1   Title Pt will be able to perform 5x sit to stand under 30 seconds with one UE to  improve functional strength from standard chair.    Baseline Pt unable to perform them (10/24/19); unable to without UE support (11/24/19)    Time 4    Period Weeks    Status Revised    Target Date 12/22/19      PT SHORT TERM GOAL #2   Title Patient will be able to ambulate for 10 min without rest with use of his walker to improve walking endurnace    Baseline <5 min (10/24/19)    Time 4    Period Weeks    Status Revised    Target Date 11/21/19      PT SHORT TERM GOAL #3   Title Pt will demo 4 points improvement on Berg balance scale to improve overall static balance    Baseline 37/56 (10/24/19)    Time 4    Period Weeks    Status New    Target Date 11/21/19             PT Long Term Goals - 10/24/19 1852      PT LONG TERM GOAL #1   Title Pt will demo 47/56 or better on BBS to improve balance and reduce fall risk    Baseline 37/26 (10/24/19)    Time 8    Period Weeks    Status New    Target Date 12/19/19      PT LONG TERM GOAL #2   Title Patient will be able to perform sit to stand in under 25 seconds to improve overall strength    Baseline unable (10/24/19)    Time 8    Period Weeks    Status New    Target Date 12/19/19      PT LONG TERM GOAL #3   Title Pt will be able to ambulate 100' without AD to improve household ambulation without AD    Baseline Pt uses cane in house (Eval)    Time 8    Period Weeks    Status New    Target Date 12/19/19      PT LONG TERM GOAL #4   Title Pt will demo improvement by 0.15 m/s with use of st. cane to improve functional gait velocity    Baseline 0.21m/s with cane, 0.26m/s with rolling walker (eval)    Time 8    Period Weeks    Status New    Target Date 12/19/19                 Plan - 11/27/19 1333  Clinical Impression Statement Today's skilled session was focused on continued progression of LE strengthening, working on bed mobility and patient education to improve activity level at home. Pt tolerated session well.     Personal Factors and Comorbidities Comorbidity 3+    Comorbidities craniotomy secondary to glioblastoma, hx of lumbar surgeries x 2, CABG x 4    Examination-Activity Limitations Squat;Stairs;Locomotion Level;Caring for Others;Carry    Examination-Participation Restrictions Cleaning;Community Activity;Driving;Laundry;Yard Work;Shop    Stability/Clinical Decision Making Unstable/Unpredictable    Rehab Potential Good    PT Frequency 2x / week    PT Duration 8 weeks    PT Treatment/Interventions ADLs/Self Care Home Management;Gait training;Stair training;Functional mobility training;Therapeutic activities;Therapeutic exercise;Balance training;Neuromuscular re-education;Vestibular;Visual/perceptual remediation/compensation           Patient will benefit from skilled therapeutic intervention in order to improve the following deficits and impairments:  Abnormal gait, Decreased activity tolerance, Decreased balance, Decreased mobility, Decreased endurance, Difficulty walking, Dizziness, Impaired vision/preception  Visit Diagnosis: Muscle weakness (generalized)  Unsteadiness on feet  Dizziness and giddiness  Other abnormalities of gait and mobility  Glioblastoma multiforme (HCC)     Problem List Patient Active Problem List   Diagnosis Date Noted  . Glioblastoma multiforme ([place]) 10/11/2019  . Dyslipidemia   . Seizure prophylaxis   . Chronic diastolic congestive heart failure ([place])   . Diabetic peripheral neuropathy ([place])   . Palliative care by specialist   . Weakness 10/08/2019  . Vasogenic edema ([place]) 10/08/2019  . Gross hematuria 10/08/2019  . DNR (do not resuscitate) discussion 07/13/2019  . S/P craniotomy 07/04/2019  . 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 Respiratory failure ([place])   . Encephalopathy acute   . Brain mass 07/01/2019  . Fever 07/01/2019  . Atherosclerosis of native artery of both lower extremities with intermittent  claudication ([place]) 08/23/2018  . Coronary artery disease without angina pectoris 08/23/2018  . Dyspnea on exertion 08/23/2018  . Biochemically recurrent malignant neoplasm of prostate 08/16/2018  . S/P carotid endarterectomy 08/20/2015  . Essential hypertension 08/20/2015  . Type 2 diabetes mellitus with circulatory disorder ([place]) 08/20/2015  . HLD (hyperlipidemia) 08/20/2015  . Carotid artery stenosis, symptomatic 07/03/2015  . Carotid stenosis, right   . TIA (transient ischemic attack) 06/18/2015  . ASCVD (arteriosclerotic cardiovascular disease) 06/18/2015  . Diabetes mellitus without complication ([place]) 56/97/9480  . Resistant hypertension 06/18/2015  . Hyperlipidemia 06/18/2015  . Spinal stenosis of lumbar region with neurogenic claudication 09/06/2014  . Atherosclerosis of native artery of extremity with intermittent claudication ([place]) 03/09/2013  . HYPERLIPIDEMIA 04/08/2009  . TOBACCO ABUSE 04/08/2009  . ATHEROSCLEROTIC CARDIOVASCULAR DISEASE 04/08/2009  . INTERMITTENT VERTIGO 04/08/2009  . ADENOCARCINOMA, PROSTATE 03/21/2009  . ABDOMINAL PAIN -GENERALIZED 03/21/2009  . History of coronary artery bypass graft 06/28/2008    Kerrie Pleasure 11/27/2019, 1:43 PM  [facility] [address] [place], [place], [address] Phone: [phone]   Fax:  [phone]  Name: SEYMORE BRODOWSKI MRN: [id number] Date of Birth: Aug 04, 1945

## 2019-11-28 ENCOUNTER — Ambulatory Visit (INDEPENDENT_AMBULATORY_CARE_PROVIDER_SITE_OTHER): Payer: Medicare Other | Admitting: Podiatry

## 2019-11-28 DIAGNOSIS — B351 Tinea unguium: Secondary | ICD-10-CM | POA: Diagnosis not present

## 2019-11-28 DIAGNOSIS — E1159 Type 2 diabetes mellitus with other circulatory complications: Secondary | ICD-10-CM

## 2019-11-28 DIAGNOSIS — L600 Ingrowing nail: Secondary | ICD-10-CM

## 2019-11-28 DIAGNOSIS — I70213 Atherosclerosis of native arteries of extremities with intermittent claudication, bilateral legs: Secondary | ICD-10-CM | POA: Diagnosis not present

## 2019-11-28 NOTE — Progress Notes (Signed)
  Subjective:  Patient ID: [name], male    DOB: Jul 31, 1945,  MRN: [id number]  Chief Complaint  Patient presents with  . Nail Problem    Ingrowing toenail of right foot    74 y.o. male presents with the above complaint. History confirmed with patient.  Had been treating at home with his wife with antibiotic ointment  Objective:  Physical Exam: warm, good capillary refill, DP reduced bilateral, no trophic changes or ulcerative lesions, normal sensory exam, onychomycosis of all toenails and PT reduced bilateral.  Ingrowing lateral border of right second toenail without paronychia, resolving  Assessment:   1. Atherosclerosis of native artery of both lower extremities with intermittent claudication ([place])   2. Type 2 diabetes mellitus with other circulatory complication, unspecified whether long term insulin use ([place])   3. Onychomycosis   4. Ingrowing toenail of right foot      Plan:  Patient was evaluated and treated and all questions answered.   -Patient is diabetic with a qualifying condition for at risk foot care. -Educated on DM Footcare. -The incurvated border of the right second toe was treated in a slant back fashion.  No avulsion was necessary today.  Procedure: Nail Debridement Rationale: Patient meets criteria for routine foot care due to diabetes, peripheral arterial disease Type of Debridement: manual, sharp debridement. Instrumentation: Nail nipper, rotary burr. Number of Nails: 10    No follow-ups on file.

## 2019-11-29 ENCOUNTER — Other Ambulatory Visit: Payer: Self-pay

## 2019-11-29 ENCOUNTER — Ambulatory Visit: Payer: Medicare Other

## 2019-11-29 ENCOUNTER — Ambulatory Visit: Payer: Medicare Other | Admitting: Occupational Therapy

## 2019-11-29 DIAGNOSIS — R41840 Attention and concentration deficit: Secondary | ICD-10-CM

## 2019-11-29 DIAGNOSIS — M6281 Muscle weakness (generalized): Secondary | ICD-10-CM | POA: Diagnosis not present

## 2019-11-29 DIAGNOSIS — R2681 Unsteadiness on feet: Secondary | ICD-10-CM

## 2019-11-29 DIAGNOSIS — R41844 Frontal lobe and executive function deficit: Secondary | ICD-10-CM | POA: Diagnosis not present

## 2019-11-29 DIAGNOSIS — R2689 Other abnormalities of gait and mobility: Secondary | ICD-10-CM

## 2019-11-29 DIAGNOSIS — R41842 Visuospatial deficit: Secondary | ICD-10-CM | POA: Diagnosis not present

## 2019-11-29 DIAGNOSIS — C719 Malignant neoplasm of brain, unspecified: Secondary | ICD-10-CM

## 2019-11-29 DIAGNOSIS — H53462 Homonymous bilateral field defects, left side: Secondary | ICD-10-CM

## 2019-11-29 DIAGNOSIS — R278 Other lack of coordination: Secondary | ICD-10-CM | POA: Diagnosis not present

## 2019-11-29 DIAGNOSIS — R42 Dizziness and giddiness: Secondary | ICD-10-CM

## 2019-11-29 NOTE — Therapy (Signed)
[facility] [address] [place], [place], [address] Phone: [phone]   Fax:  [phone]  Occupational Therapy Treatment  Patient Details  Name: [name] MRN: [id number] Date of Birth: [birth year] Referring Provider (OT): Dr. Bertis Ruddy   Encounter Date: 11/29/2019   OT End of Session - 11/29/19 1321    Visit Number 10    Number of Visits 25    Date for OT Re-Evaluation 01/23/20    Authorization Type MCR primary, AARP secondary    Authorization Time Period 90 days, may d/c after 8 weeks dep on progress.    Authorization - Visit Number 10    Authorization - Number of Visits 10    Progress Note Due on Visit 10    OT Start Time 1230    OT Stop Time 1315    OT Time Calculation (min) 45 min    Activity Tolerance Patient tolerated treatment well    Behavior During Therapy WFL for tasks assessed/performed           Past Medical History:  Diagnosis Date  . Anxiety   . Arthritis   . ASCVD (arteriosclerotic cardiovascular disease)    03/2004: DES x2 to the RCA; IMI with stent occlusion in 02/2005- suboptimal Plavix compliance  . CAD (coronary artery disease)   . Carcinoma of prostate ([place])    Clopidogrel held for biopsy  . Constipation due to pain medication   . Diabetes mellitus without complication ([place])   . Diabetic neuropathy ([place])   . Headache 2021  . Hyperlipidemia   . Hypertension   . Mild depression ([place])   . Morbid obesity ([place])   . Myocardial infarction ([place])   . Shortness of breath dyspnea   . Sleep apnea    no longer uses cpap  . Stroke ([place]) 2017  . Tobacco abuse    stopped smoking 06/28/09    Past Surgical History:  Procedure Laterality Date  . APPLICATION OF CRANIAL NAVIGATION N/A 07/04/2019   Procedure: APPLICATION OF CRANIAL NAVIGATION;  Surgeon: Ashok Pall, MD;  Location: [facility];  Service: Neurosurgery;  Laterality: N/A;  . BACK SURGERY    . CARDIAC CATHETERIZATION      X 1 stent before having CABG  . CORONARY ARTERY BYPASS GRAFT  06/28/2008   X4  . CRANIOTOMY Right 07/04/2019   Procedure: Right occipital craniotomy for tumor with brainlab;  Surgeon: Ashok Pall, MD;  Location: [facility];  Service: Neurosurgery;  Laterality: Right;  . ENDARTERECTOMY Right 07/03/2015   Procedure: RIGHT CAROTID ENDARTERECTOMY WITH BOVINE PERICARDIUM PATCH ANGIOPLASTY;  Surgeon: Serafina Mitchell, MD;  Location: [facility];  Service: Vascular;  Laterality: Right;  . LUMBAR LAMINECTOMY/DECOMPRESSION MICRODISCECTOMY N/A 09/06/2014   Procedure: LUMBER DECOMPRESSION L3-5;  Surgeon: Melina Schools, MD;  Location: [facility];  Service: Orthopedics;  Laterality: N/A;  . LUMBAR SPINE SURGERY  2002  . PROSTATE BIOPSY    . PROSTATECTOMY  02/2007  . VASECTOMY      There were no vitals filed for this visit.   Subjective Assessment - 11/29/19 1237    Subjective  Today is a little slower    Patient is accompanied by: Family member   WIFE   Pertinent History [name] is a 74 y.o. 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, prostate cancer with radiation therapy prostatectomy, CAD with CABG maintained on  Xarelto, chronic diastolic congestive heart failure.  Lives with spouse two-level home he had been receiving outpatient therapies.  Presented 10/08/2019 with increasing left-sided weakness headache nausea vomiting as well as bouts of hematuria.   CT of the head showed new severe vasogenic edema throughout the posterior right cerebral hemisphere with 8 mm leftNo evidence of acute intracranial abnormality.  Follow-up medical oncology maintained on Decadron therapy.    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 No/denies            Pt copying simple block designs with min cues and assist needed to copy first design. Pt copied second block design with one questioning cue.  Pt copying phone #'s from Lt to Rt side of page with 1 error. Pt crossing out #"s in order (17M print size) with visual aids and finger guide, min cues/assist to stay on line and 1 error.  Discussed fluctuations in progress due to nature of diagnosis and emphasis placed on functional tasks at home (donning shirt, pants, scanning in cabinets and refrigerator) and also environmental scanning when entering room or building.                        OT Short Term Goals - 11/24/19 1043      OT SHORT TERM GOAL #1   Title Pt to verbalize understanding of visual compensatory strategies to improve daily function including reading and environmental scanning    Time 4    Period Weeks    Status Achieved      OT SHORT TERM GOAL #2   Title Pt to perform tabletop scanning with 90% or greater accuracy using strategies    Baseline 85% for 1.5 M number cancellation    Time 4    Period Weeks    Status On-going   28/80 missed     OT SHORT TERM GOAL #3   Title Pt will read 3/4 page of text with accuracy, visual aids/AE and extra time PRN    Time 4    Period Weeks    Status Achieved   met with larger print double spaced and line guide     OT SHORT TERM GOAL #4   Title Pt will perform dressing with supervision/ set up    Baseline min A for shoes/ socks    Time 4    Period Weeks    Status On-going   ongoing, not perfroming at home     OT SHORT TERM GOAL #5   Title Pt will demonstrate improved LUE fine motor coordination for ADLs as evidenced by decreasing 9 hole peg test score to 52 secs or less.    Baseline RUE 33 secs, LUE 58.25    Time 4    Period Weeks    Status On-going   11/21/19:  68.41sec     OT SHORT TERM GOAL #6   Title Pt will demonstrate ability to retrieve a llightweight object at 130 shoulder flexion with LUE demonstrating  good control.    Baseline shoulder flexion RUE 140, LUE 120    Time 4    Period Weeks    Status Achieved   11/21/19:  130*            OT Long Term Goals - 10/25/19 0747      OT LONG TERM GOAL #1  Title Pt will perform environmental scanning at 90% accuracy in mod distracting environment    Time 12    Period Weeks    Status New    Target Date 01/23/20      OT LONG TERM GOAL #2   Title Pt to read full page of text or greater with visual aids/AE prn    Time 12    Period Weeks    Status New      OT LONG TERM GOAL #3   Title Pt will demonstrate improved fine motor coordination in LUE for ADLS as evidenced by decreasing 9 hole peg test score to 48 secs or less.    Baseline Pt will perform all basic ADLS with distant supervision.    Time 12    Period Weeks    Status New      OT LONG TERM GOAL #4   Title Pt will perform basic home management tasks with supervision demonstrating good safety awareness.    Time 12    Period Weeks    Status New                 Plan - 11/29/19 1322    Clinical Impression Statement This 10th progress note is for dates 10/24/19 to 11/29/19: Pt's progress is slow and fluctuates due to diagnosis. Pt continues to be limited by visual/perceptual deficits, visual memory, and cognitive deficits and well as Lt neglect. Goals may need to be adjusted    Occupational performance deficits (Please refer to evaluation for details): IADL's;Work;Leisure;Social Participation;ADL's    Body Structure / Function / Physical Skills IADL;Endurance;Balance;Vision;ADL;Mobility;Strength;UE functional use;FMC;Coordination;Gait;GMC;ROM;Decreased knowledge of use of DME;Dexterity    Cognitive Skills Memory;Problem Solve;Perception;Thought;Safety Awareness    Comorbidities impacting occupational performance description: glioblastoma - pt has begun radiation    OT Frequency 2x / week    OT Duration 12 weeks    OT Treatment/Interventions Self-care/ADL training;Functional  Mobility Training;Therapeutic activities;Coping strategies training;Visual/perceptual remediation/compensation;Patient/family education;Cognitive remediation/compensation;Therapeutic exercise;Balance training;Manual Therapy;Neuromuscular education;Aquatic Therapy;Energy conservation;DME and/or AE instruction;Paraffin;Fluidtherapy;Gait Training;Passive range of motion;Moist Heat    Plan walker negotiation and safety, simple environmental scanning, scanning cabinets/refrigerator, scanning to use microwave, etc    Consulted and Agree with Plan of Care Patient;Family member/caregiver    Family Member Consulted wife           Patient will benefit from skilled therapeutic intervention in order to improve the following deficits and impairments:   Body Structure / Function / Physical Skills: IADL, Endurance, Balance, Vision, ADL, Mobility, Strength, UE functional use, FMC, Coordination, Gait, GMC, ROM, Decreased knowledge of use of DME, Dexterity Cognitive Skills: Memory, Problem Solve, Perception, Thought, Safety Awareness     Visit Diagnosis: Visuospatial deficit  Attention and concentration deficit  Frontal lobe and executive function deficit  Homonymous hemianopsia, left    Problem List Patient Active Problem List   Diagnosis Date Noted  . Glioblastoma multiforme ([place]) 10/11/2019  . Dyslipidemia   . Seizure prophylaxis   . Chronic diastolic congestive heart failure ([place])   . Diabetic peripheral neuropathy ([place])   . Palliative care by specialist   . Weakness 10/08/2019  . Vasogenic edema ([place]) 10/08/2019  . Gross hematuria 10/08/2019  . DNR (do not resuscitate) discussion 07/13/2019  . S/P craniotomy 07/04/2019  . 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 Respiratory failure ([place])   . Encephalopathy acute   . Brain mass 07/01/2019  . Fever 07/01/2019  . Atherosclerosis of native artery of both lower extremities with intermittent claudication  (  [place]) 08/23/2018  . Coronary artery disease without angina pectoris 08/23/2018  . Dyspnea on exertion 08/23/2018  . Biochemically recurrent malignant neoplasm of prostate 08/16/2018  . S/P carotid endarterectomy 08/20/2015  . Essential hypertension 08/20/2015  . Type 2 diabetes mellitus with circulatory disorder ([place]) 08/20/2015  . HLD (hyperlipidemia) 08/20/2015  . Carotid artery stenosis, symptomatic 07/03/2015  . Carotid stenosis, right   . TIA (transient ischemic attack) 06/18/2015  . ASCVD (arteriosclerotic cardiovascular disease) 06/18/2015  . Diabetes mellitus without complication ([place]) 83/38/2505  . Resistant hypertension 06/18/2015  . Hyperlipidemia 06/18/2015  . Spinal stenosis of lumbar region with neurogenic claudication 09/06/2014  . Atherosclerosis of native artery of extremity with intermittent claudication ([place]) 03/09/2013  . HYPERLIPIDEMIA 04/08/2009  . TOBACCO ABUSE 04/08/2009  . ATHEROSCLEROTIC CARDIOVASCULAR DISEASE 04/08/2009  . INTERMITTENT VERTIGO 04/08/2009  . ADENOCARCINOMA, PROSTATE 03/21/2009  . ABDOMINAL PAIN -GENERALIZED 03/21/2009  . History of coronary artery bypass graft 06/28/2008    Carey Bullocks, OTR/L 11/29/2019, 1:26 PM  [facility] [address] [place], [place], [address] Phone: [phone]   Fax:  [phone]  Name: TOME WILSON MRN: [id number] Date of Birth: December 26, 1945

## 2019-11-29 NOTE — Therapy (Signed)
[facility] [address] [place], [place], [address] Phone: [phone]   Fax:  [phone]  Physical Therapy Progress Note  Patient Details  Name: [name] MRN: [id number] Date of Birth: 08/31/1945 Referring Provider (PT): [name]   Encounter Date: 11/29/2019   PT End of Session - 11/29/19 1400    Visit Number 10    Number of Visits 17    Date for PT Re-Evaluation 12/19/19    Authorization Type 10 V PN on 11/29/19    Progress Note Due on Visit 20    PT Start Time 1320    PT Stop Time 1400    PT Time Calculation (min) 40 min    Equipment Utilized During Treatment Gait belt    Activity Tolerance Patient tolerated treatment well;Patient limited by fatigue    Behavior During Therapy [facility] for tasks assessed/performed           Past Medical History:  Diagnosis Date  . Anxiety   . Arthritis   . ASCVD (arteriosclerotic cardiovascular disease)    03/2004: DES x2 to the RCA; IMI with stent occlusion in 02/2005- suboptimal Plavix compliance  . CAD (coronary artery disease)   . Carcinoma of prostate ([place])    Clopidogrel held for biopsy  . Constipation due to pain medication   . Diabetes mellitus without complication ([place])   . Diabetic neuropathy ([place])   . Headache 2021  . Hyperlipidemia   . Hypertension   . Mild depression ([place])   . Morbid obesity ([place])   . Myocardial infarction ([place])   . Shortness of breath dyspnea   . Sleep apnea    no longer uses cpap  . Stroke ([place]) 2017  . Tobacco abuse    stopped smoking 06/28/09    Past Surgical History:  Procedure Laterality Date  . APPLICATION OF CRANIAL NAVIGATION N/A 07/04/2019   Procedure: APPLICATION OF CRANIAL NAVIGATION;  Surgeon: Ashok Pall, MD;  Location: [facility];  Service: Neurosurgery;  Laterality: N/A;  . BACK SURGERY    . CARDIAC CATHETERIZATION     X 1 stent before having CABG  . CORONARY ARTERY BYPASS GRAFT  06/28/2008   X4  . CRANIOTOMY  Right 07/04/2019   Procedure: Right occipital craniotomy for tumor with brainlab;  Surgeon: Ashok Pall, MD;  Location: [facility];  Service: Neurosurgery;  Laterality: Right;  . ENDARTERECTOMY Right 07/03/2015   Procedure: RIGHT CAROTID ENDARTERECTOMY WITH BOVINE PERICARDIUM PATCH ANGIOPLASTY;  Surgeon: Serafina Mitchell, MD;  Location: [facility];  Service: Vascular;  Laterality: Right;  . LUMBAR LAMINECTOMY/DECOMPRESSION MICRODISCECTOMY N/A 09/06/2014   Procedure: LUMBER DECOMPRESSION L3-5;  Surgeon: Melina Schools, MD;  Location: [facility];  Service: Orthopedics;  Laterality: N/A;  . LUMBAR SPINE SURGERY  2002  . PROSTATE BIOPSY    . PROSTATECTOMY  02/2007  . VASECTOMY      There were no vitals filed for this visit.   Subjective Assessment - 11/29/19 1330    Subjective no new complaints    Patient is accompained by: Family member    Pertinent History PMH: Rt occipital craniotomy 07/04/19 secondary to glioblastoma. PMH: Prostate CA, CVA 2017 (no residual deficits), CAD, DM, HTN, hx of 2 lumbar surgeries 2002, 2016 (laminectomies)    Limitations Standing;Walking    How long can you sit comfortably? no issues    How long can you stand comfortably? 5 min    How long can you walk comfortably? 5 min    Patient Stated  Goals walk better              [facility] PT Assessment - 11/29/19 1355      Standardized Balance Assessment   Standardized Balance Assessment Five Times Sit to Stand    Five times sit to stand comments  33   bil UE support             SLR, Sl hip abduction: 2 x 10, 0lbs Bridge: 2 x 10 SL clamshells:  2 x 15 R and L, 0lbs Seated LAQ: 2 x 10 R and L Gait training: 1 x 414' with rolling walker, cues to relax shoulders and heel to toe pattern. 1 x 115' with no AD  Sit to L SL: CGA with verbal cueing for sequencing Supine to sidelying bil: 1x SBA L SL to sit: CGA: 1x with verbal cueing for sequencing Sit to stand: 2 x 5 bil HHA, pt able to stabilize independently when standing Bil  leg press: 90lbs AA 2x, 70lbs 2x, 50lbs 10x Gait training: 1 x 115' no AD, cues for picking up his feet more and scanning the environment ahead and in front of him, CGA                         PT Short Term Goals - 11/29/19 1351      PT SHORT TERM GOAL #1   Title Pt will be able to perform 5x sit to stand under 30 seconds with one UE to improve functional strength from standard chair.    Baseline Pt unable to perform them (10/24/19); unable to without UE support (11/24/19); attempted with one UE but pt only able to complete 1 rep with multiple tries    Time 4    Period Weeks    Status On-going    Target Date 12/22/19      PT SHORT TERM GOAL #2   Title Patient will be able to ambulate for 10 min without rest with use of his walker to improve walking endurnace    Baseline <5 min (10/24/19); 5-10 min (11/29/19)    Time 4    Period Weeks    Status Achieved    Target Date 11/21/19      PT SHORT TERM GOAL #3   Title Pt will demo 4 points improvement on Berg balance scale to improve overall static balance    Baseline 37/56 (10/24/19); 8/6 42/56    Time 4    Period Weeks    Status Achieved    Target Date 11/21/19             PT Long Term Goals - 11/29/19 1353      PT LONG TERM GOAL #1   Title Pt will demo 47/56 or better on BBS to improve balance and reduce fall risk    Baseline 37/26 (10/24/19);11/24/19 42/56    Time 8    Period Weeks    Status On-going      PT LONG TERM GOAL #2   Title Patient will be able to perform sit to stand in under 25 seconds to improve overall strength    Baseline unable (10/24/19); needs to use bil UE to complete sit to stand (33 sec)    Time 8    Period Weeks    Status New      PT LONG TERM GOAL #3   Title Pt will be able to ambulate 100' without AD to improve household ambulation without AD  Baseline Pt uses cane in house (Eval); 115' with CGA without AD    Time 8    Period Weeks    Status Achieved      PT LONG TERM GOAL #4    Title Pt will demo improvement by 0.15 m/s with use of st. cane to improve functional gait velocity    Baseline 0.45m/s with cane, 0.65m/s with rolling walker (eval)    Time 8    Period Weeks    Status New                 Plan - 11/29/19 1348    Clinical Impression Statement Patient has been seen for total of 10 session for gait and balance disorder. Patient is making a steady progress towards his short term and long term goals. Patient is demonstrating gradual improvement in his transfers, walking endrance and strength. Patient requires bil UE support to be able to stand up from sitting position due to decreased core and LE strength. Today's session was focused on improved exercises endurance by increasing reps with exercises with reduced weight. Patient able to walk 68' with RW with fatigue and 115' without AD. Patient continues to require motivation to increase activity level at home. Patient will continue to benefit from skilled PT to address these impairments and improve overall function.    Personal Factors and Comorbidities Comorbidity 3+    Comorbidities craniotomy secondary to glioblastoma, hx of lumbar surgeries x 2, CABG x 4    Examination-Activity Limitations Squat;Stairs;Locomotion Level;Caring for Others;Carry    Examination-Participation Restrictions Cleaning;Community Activity;Driving;Laundry;Yard Work;Shop    Stability/Clinical Decision Making Unstable/Unpredictable    Rehab Potential Good    PT Frequency 2x / week    PT Duration 8 weeks    PT Treatment/Interventions ADLs/Self Care Home Management;Gait training;Stair training;Functional mobility training;Therapeutic activities;Therapeutic exercise;Balance training;Neuromuscular re-education;Vestibular;Visual/perceptual remediation/compensation    PT Next Visit Plan Reassess goals, practice sit to stand    PT Home Exercise Plan SLS, sit to stand without HHA, Access Code: [email or web address]:  [email or web address]: 06/03/2021Prepared by: Gwenyth Bouillon PatelExercisesStanding Balance in Corner with Eyes Closed - 1 x daily - 7 x weekly - 2 sets - 10 reps           Patient will benefit from skilled therapeutic intervention in order to improve the following deficits and impairments:  Abnormal gait, Decreased activity tolerance, Decreased balance, Decreased mobility, Decreased endurance, Difficulty walking, Dizziness, Impaired vision/preception  Visit Diagnosis: Muscle weakness (generalized)  Unsteadiness on feet  Dizziness and giddiness  Other abnormalities of gait and mobility  Glioblastoma multiforme (HCC)     Problem List Patient Active Problem List   Diagnosis Date Noted  . 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 Dyslipidemia   . Seizure prophylaxis   . Chronic diastolic congestive heart failure ([place])   . Diabetic peripheral neuropathy ([place])   . Palliative care by specialist   . Weakness 10/08/2019  . Vasogenic edema ([place]) 10/08/2019  . Gross hematuria 10/08/2019  . DNR (do not resuscitate) discussion 07/13/2019  . S/P craniotomy 07/04/2019  . 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 Respiratory failure ([place])   . Encephalopathy acute   . Brain mass 07/01/2019  . Fever 07/01/2019  . Atherosclerosis of native artery of both lower extremities with intermittent claudication ([place]) 08/23/2018  . Coronary artery disease without angina pectoris 08/23/2018  . Dyspnea on exertion 08/23/2018  . Biochemically recurrent malignant neoplasm of prostate 08/16/2018  . S/P carotid endarterectomy 08/20/2015  . Essential hypertension  08/20/2015  . Type 2 diabetes mellitus with circulatory disorder ([place]) 08/20/2015  . HLD (hyperlipidemia) 08/20/2015  . Carotid artery stenosis, symptomatic 07/03/2015  . Carotid stenosis, right   . TIA (transient ischemic attack) 06/18/2015  . ASCVD (arteriosclerotic cardiovascular disease)  06/18/2015  . Diabetes mellitus without complication ([place]) 75/17/0017  . Resistant hypertension 06/18/2015  . Hyperlipidemia 06/18/2015  . Spinal stenosis of lumbar region with neurogenic claudication 09/06/2014  . Atherosclerosis of native artery of extremity with intermittent claudication ([place]) 03/09/2013  . HYPERLIPIDEMIA 04/08/2009  . TOBACCO ABUSE 04/08/2009  . ATHEROSCLEROTIC CARDIOVASCULAR DISEASE 04/08/2009  . INTERMITTENT VERTIGO 04/08/2009  . ADENOCARCINOMA, PROSTATE 03/21/2009  . ABDOMINAL PAIN -GENERALIZED 03/21/2009  . History of coronary artery bypass graft 06/28/2008    Kerrie Pleasure, PT 11/29/2019, 2:04 PM  [facility] [address] [place], [place], [address] Phone: [phone]   Fax:  [phone]  Name: REYNALD WOODS MRN: [id number] Date of Birth: 1945-08-08

## 2019-11-30 ENCOUNTER — Inpatient Hospital Stay: Payer: Medicare Other | Attending: Internal Medicine | Admitting: Internal Medicine

## 2019-11-30 ENCOUNTER — Other Ambulatory Visit: Payer: Self-pay

## 2019-11-30 ENCOUNTER — Inpatient Hospital Stay: Payer: Medicare Other

## 2019-11-30 VITALS — BP 147/81 | HR 70 | Temp 96.7°F | Resp 18 | Ht 74.0 in | Wt 202.3 lb

## 2019-11-30 DIAGNOSIS — C714 Malignant neoplasm of occipital lobe: Secondary | ICD-10-CM | POA: Insufficient documentation

## 2019-11-30 DIAGNOSIS — R569 Unspecified convulsions: Secondary | ICD-10-CM | POA: Insufficient documentation

## 2019-11-30 DIAGNOSIS — C719 Malignant neoplasm of brain, unspecified: Secondary | ICD-10-CM

## 2019-11-30 LAB — CMP (CANCER CENTER ONLY)
ALT: 43 U/L (ref 0–44)
AST: 24 U/L (ref 15–41)
Albumin: 3.4 g/dL — ABNORMAL LOW (ref 3.5–5.0)
Alkaline Phosphatase: 96 U/L (ref 38–126)
Anion gap: 9 (ref 5–15)
BUN: 21 mg/dL (ref 8–23)
CO2: 26 mmol/L (ref 22–32)
Calcium: 9.2 mg/dL (ref 8.9–10.3)
Chloride: 99 mmol/L (ref 98–111)
Creatinine: 0.78 mg/dL (ref 0.61–1.24)
GFR, Est AFR Am: >60 mL/min (ref >60)
GFR, Estimated: >60 mL/min (ref >60)
Glucose, Bld: 89 mg/dL (ref 70–99)
Potassium: 4.3 mmol/L (ref 3.5–5.1)
Sodium: 134 mmol/L — ABNORMAL LOW (ref 135–145)
Total Bilirubin: 1.5 mg/dL — ABNORMAL HIGH (ref 0.3–1.2)
Total Protein: 6.5 g/dL (ref 6.5–8.1)

## 2019-11-30 LAB — CBC WITH DIFFERENTIAL (CANCER CENTER ONLY)
Abs Immature Granulocytes: 0.17 10*3/uL — ABNORMAL HIGH (ref 0.00–0.07)
Basophils Absolute: 0 10*3/uL (ref 0.0–0.1)
Basophils Relative: 0 %
Eosinophils Absolute: 0 10*3/uL (ref 0.0–0.5)
Eosinophils Relative: 0 %
HCT: 37.4 % — ABNORMAL LOW (ref 39.0–52.0)
Hemoglobin: 12.8 g/dL — ABNORMAL LOW (ref 13.0–17.0)
Immature Granulocytes: 3 %
Lymphocytes Relative: 7 %
Lymphs Abs: 0.4 10*3/uL — ABNORMAL LOW (ref 0.7–4.0)
MCH: 29.2 pg (ref 26.0–34.0)
MCHC: 34.2 g/dL (ref 30.0–36.0)
MCV: 85.4 fL (ref 80.0–100.0)
Monocytes Absolute: 0.4 10*3/uL (ref 0.1–1.0)
Monocytes Relative: 7 %
Neutro Abs: 4.7 10*3/uL (ref 1.7–7.7)
Neutrophils Relative %: 83 %
Platelet Count: 148 10*3/uL — ABNORMAL LOW (ref 150–400)
RBC: 4.38 MIL/uL (ref 4.22–5.81)
RDW: 15.6 % — ABNORMAL HIGH (ref 11.5–15.5)
WBC Count: 5.6 10*3/uL (ref 4.0–10.5)
nRBC: 0.7 % — ABNORMAL HIGH (ref 0.0–0.2)

## 2019-11-30 MED ORDER — DEXAMETHASONE 2 MG PO TABS: 3.0000 mg | ORAL_TABLET | Freq: Two times a day (BID) | ORAL | 2 refills | Status: DC

## 2019-11-30 MED ORDER — TEMOZOLOMIDE 140 MG PO CAPS: 200.0000 mg/m2/d | ORAL_CAPSULE | Freq: Every day | ORAL | 0 refills | Status: DC

## 2019-11-30 NOTE — Progress Notes (Signed)
[facility], [place] [address] [phone]   Interval Evaluation  Date of Service: 11/30/19 Patient Name: [name] Patient MRN: [id number] Patient DOB: 1945/05/30 Provider: Ventura Sellers, MD  Identifying Statement:  [name] is a 74 y.o. male with right occipital glioblastoma    Referring Provider: Maurice Small, MD [address] [address] [place],  [place] [address]  Oncologic History: Oncology History  Glioblastoma with isocitrate dehydrogenase gene wildtype ([place])  07/04/2019 Surgery   Debulking resection by Dr. Christella Noa; path demonstrates Glioblastoma   08/17/2019 - 09/28/2019 Radiation Therapy   IMRT with concurrent daily Temozolomide 41m/m2   10/30/2019 -  Chemotherapy   The patient had temozolomide (TEMODAR) 180 MG capsule, 180 mg (100 % of original dose 180 mg), Oral, Daily, 1 of 1 cycle, Start date: 10/30/2019, End date: -- Dose modification: 180 mg (original dose 180 mg, Cycle 1) temozolomide (TEMODAR) 140 MG capsule, 140 mg (100 % of original dose 140 mg), Oral, Daily, 1 of 1 cycle, Start date: 10/30/2019, End date: -- Dose modification: 140 mg (original dose 140 mg, Cycle 1)  for chemotherapy treatment.      Biomarkers:  MGMT Unmethylated.  IDH 1/2 Wild type.  EGFR Not expressed  TERT Unknown   Interval History:  Mark KREIDERpresents to clinic today now having completed first cycle of 5-day Temodar.  No further seizures or progression of left sided weakness.  Fatigue has been stable since last month.  He has been walking primarily with a cane assist. Overall his left sided vision is stable from prior.  Decadron dose is currently 457min AM, 47m51mn PM.  H+P (07/13/19) Patient presented in early March 2021 with several days of progressive left sided visual impairment.  He noticed "missing things" in the left side of his field in his work and while driving.  He then developed  headaches which were new for him.  Never complained of difficulty walking, speaking, using arms or hands.  CNS imaging demonstrated tumor within right occipital lobe, which was biopsied by Dr. CabChristella Noa 07/04/19.  Following surgery, he has no new complaints or progressive deficits.  He may be taking decadron 4mg36mice per day.  Otherwise his functional status is only limited by visual impairment. Worked at [employer] lives with his wife of 50 y68rs.  Medications: Current Outpatient Medications on File Prior to Visit  Medication Sig Dispense Refill  . acetaminophen (TYLENOL) 325 MG tablet Take 2 tablets (650 mg total) by mouth every 6 (six) hours as needed for mild pain (or Fever >/= 101). 30 tablet 0  . amLODipine (NORVASC) 10 MG tablet Take 1 tablet (10 mg total) by mouth every evening. 30 tablet 0  . atorvastatin (LIPITOR) 80 MG tablet Take 1 tablet (80 mg total) by mouth at bedtime. 30 tablet 0  . calcium citrate (CALCITRATE - DOSED IN MG ELEMENTAL CALCIUM) 950 (200 Ca) MG tablet Take 1 tablet (200 mg of elemental calcium total) by mouth daily. 30 tablet 0  . dexamethasone (DECADRON) 4 MG tablet Take 1 tablet (4 mg total) by mouth 2 (two) times daily. (Patient taking differently: Take 4 mg by mouth 1 day or 1 dose. 4 mg in the morning, 2 mg in the evening) 120 tablet 0  . levETIRAcetam (KEPPRA) 500 MG tablet Take 1 tablet (500 mg total) by mouth 2 (two) times daily. 60 tablet 3  .  metoprolol succinate (TOPROL-XL) 25 MG 24 hr tablet Take 0.5 tablets (12.5 mg total) by mouth every evening. 30 tablet 0  . ondansetron (ZOFRAN) 8 MG tablet Take 1 tablet (8 mg total) by mouth 2 (two) times daily as needed (nausea and vomiting). May take 30-60 minutes prior to Temodar administration if nausea/vomiting occurs. 30 tablet 1  . polyethylene glycol (MIRALAX / GLYCOLAX) 17 g packet Take 17 g by mouth daily as needed (constipation). 14 each 0  . rivaroxaban (XARELTO) 20 MG TABS tablet Take 1 tablet (20  mg total) by mouth daily with supper. (Patient not taking: Reported on 11/30/2019) 30 tablet 0  . temozolomide (TEMODAR) 140 MG capsule Take 1 capsule (140 mg total) by mouth daily. May take on an empty stomach to decrease nausea & vomiting. (Patient not taking: Reported on 11/30/2019) 5 capsule 0  . temozolomide (TEMODAR) 180 MG capsule Take 1 capsule (180 mg total) by mouth daily. May take on an empty stomach to decrease nausea & vomiting. (Patient not taking: Reported on 11/30/2019) 5 capsule 0   No current facility-administered medications on file prior to visit.    Allergies:  Allergies  Allergen Reactions  . Lisinopril Cough   Past Medical History:  Past Medical History:  Diagnosis Date  . Anxiety   . Arthritis   . ASCVD (arteriosclerotic cardiovascular disease)    03/2004: DES x2 to the RCA; IMI with stent occlusion in 02/2005- suboptimal Plavix compliance  . CAD (coronary artery disease)   . Carcinoma of prostate ([place])    Clopidogrel held for biopsy  . Constipation due to pain medication   . Diabetes mellitus without complication ([place])   . Diabetic neuropathy ([place])   . Headache 2021  . Hyperlipidemia   . Hypertension   . Mild depression ([place])   . Morbid obesity ([place])   . Myocardial infarction ([place])   . Shortness of breath dyspnea   . Sleep apnea    no longer uses cpap  . Stroke ([place]) 2017  . Tobacco abuse    stopped smoking 06/28/09   Past Surgical History:  Past Surgical History:  Procedure Laterality Date  . APPLICATION OF CRANIAL NAVIGATION N/A 07/04/2019   Procedure: APPLICATION OF CRANIAL NAVIGATION;  Surgeon: Ashok Pall, MD;  Location: [facility];  Service: Neurosurgery;  Laterality: N/A;  . BACK SURGERY    . CARDIAC CATHETERIZATION     X 1 stent before having CABG  . CORONARY ARTERY BYPASS GRAFT  06/28/2008   X4  . CRANIOTOMY Right 07/04/2019   Procedure: Right occipital craniotomy for tumor with brainlab;  Surgeon: Ashok Pall, MD;  Location: [facility];   Service: Neurosurgery;  Laterality: Right;  . ENDARTERECTOMY Right 07/03/2015   Procedure: RIGHT CAROTID ENDARTERECTOMY WITH BOVINE PERICARDIUM PATCH ANGIOPLASTY;  Surgeon: Serafina Mitchell, MD;  Location: [facility];  Service: Vascular;  Laterality: Right;  . LUMBAR LAMINECTOMY/DECOMPRESSION MICRODISCECTOMY N/A 09/06/2014   Procedure: LUMBER DECOMPRESSION L3-5;  Surgeon: Melina Schools, MD;  Location: [facility];  Service: Orthopedics;  Laterality: N/A;  . LUMBAR SPINE SURGERY  2002  . PROSTATE BIOPSY    . PROSTATECTOMY  02/2007  . VASECTOMY     Social History:  Social History   Socioeconomic History  . Marital status: Married    Spouse name: Not on file  . Number of children: 4  . Years of education: Not on file  . Highest education level: Not on file  Occupational History  . Occupation: Technical brewer  Comment: retired  . Occupation: Designer, industrial/product: [employer]    Comment: full time  Tobacco Use  . Smoking status: Former Smoker    Packs/day: 1.00    Years: 40.00    Pack years: 40.00    Types: Cigarettes    Quit date: 06/28/2008    Years since quitting: 11.4  . Smokeless tobacco: Former Systems developer    Quit date: 06/28/2009  Vaping Use  . Vaping Use: Never used  Substance and Sexual Activity  . Alcohol use: Yes    Alcohol/week: 3.0 standard drinks    Types: 2 Glasses of wine, 1 Cans of beer per week    Comment: may have 1 drink a week  . Drug use: No  . Sexual activity: Not on file  Other Topics Concern  . Not on file  Social History Narrative   Married with 3 sons and 1 daughter   Daily caffeine use, 3 per day   Social Determinants of Health   Financial Resource Strain:   . Difficulty of Paying Living Expenses:   Food Insecurity:   . Worried About Charity fundraiser in the Last Year:   . Arboriculturist in the Last Year:   Transportation Needs:   . Film/video editor (Medical):   Marland Kitchen Lack of Transportation (Non-Medical):   Physical Activity:   . Days  of Exercise per Week:   . Minutes of Exercise per Session:   Stress:   . Feeling of Stress :   Social Connections:   . Frequency of Communication with Friends and Family:   . Frequency of Social Gatherings with Friends and Family:   . Attends Religious Services:   . Active Member of Clubs or Organizations:   . Attends Archivist Meetings:   Marland Kitchen Marital Status:   Intimate Partner Violence:   . Fear of Current or Ex-Partner:   . Emotionally Abused:   Marland Kitchen Physically Abused:   . Sexually Abused:    Family History:  Family History  Problem Relation Age of Onset  . Kidney disease Mother   . Stroke Father   . Breast cancer Sister        breast progressed into bone  . Colon cancer Neg Hx   . Prostate cancer Neg Hx   . Pancreatic cancer Neg Hx     Review of Systems: Constitutional: Doesn't report fevers, chills or abnormal weight loss Eyes: Doesn't report blurriness of vision Ears, nose, mouth, throat, and face: Doesn't report sore throat Respiratory: Doesn't report cough, dyspnea or wheezes Cardiovascular: Doesn't report palpitation, chest discomfort  Gastrointestinal:  Doesn't report nausea, constipation, diarrhea GU: Doesn't report incontinence Skin: Doesn't report skin rashes Neurological: Per HPI Musculoskeletal: Doesn't report joint pain Behavioral/Psych: +depression symptoms  Physical Exam: Vitals:   11/30/19 0934  BP: (!) 147/81  Pulse: 70  Resp: 18  Temp: (!) 96.7 F (35.9 C)  SpO2: 98%   KPS: 80. General: Alert, cooperative, pleasant, in no acute distress Head: Normal EENT: No conjunctival injection or scleral icterus.  Lungs: Resp effort normal Cardiac: Regular rate Abdomen: Non-distended abdomen Skin: No rashes cyanosis or petechiae. Extremities: No clubbing or edema  Neurologic Exam: Mental Status: Awake, alert, attentive to examiner. Oriented to self and environment. Language is fluent with intact comprehension.  Cranial Nerves: Visual acuity  is grossly normal. Left hemianopia. Extra-ocular movements intact. No ptosis. Face is symmetric Motor: Tone and bulk are normal. Pronator drift left arm, moderate  hip girdle weakness bilaterally. Reflexes are symmetric, no pathologic reflexes present.  Sensory: Intact to light touch Gait: Dystaxic, walker assisted  Labs: I have reviewed the data as listed    Component Value Date/Time   NA 127 (L) 10/18/2019 0619   NA 139 05/04/2019 0954   K 4.9 10/18/2019 0619   CL 95 (L) 10/18/2019 0619   CO2 27 10/18/2019 0619   GLUCOSE 164 (H) 10/18/2019 0619   BUN 23 10/18/2019 0619   BUN 11 05/04/2019 0954   CREATININE 0.77 10/18/2019 0619   CREATININE 1.02 09/26/2019 0913   CALCIUM 8.3 (L) 10/18/2019 0619   PROT 5.7 (L) 10/12/2019 0515   PROT 6.8 05/04/2019 0954   ALBUMIN 3.1 (L) 10/12/2019 0515   ALBUMIN 4.5 05/04/2019 0954   AST 22 10/12/2019 0515   AST 15 09/26/2019 0913   ALT 36 10/12/2019 0515   ALT 19 09/26/2019 0913   ALKPHOS 61 10/12/2019 0515   BILITOT 1.1 10/12/2019 0515   BILITOT 1.4 (H) 09/26/2019 0913   GFRNONAA >60 10/18/2019 0619   GFRNONAA >60 09/26/2019 0913   GFRAA >60 10/18/2019 0619   GFRAA >60 09/26/2019 0913   Lab Results  Component Value Date   WBC 5.6 11/30/2019   NEUTROABS 4.7 11/30/2019   HGB 12.8 (L) 11/30/2019   HCT 37.4 (L) 11/30/2019   MCV 85.4 11/30/2019   PLT 148 (L) 11/30/2019    Assessment/Plan Glioblastoma with isocitrate dehydrogenase gene wildtype ([place]) [C71.9]    JEKHI BOLIN is clinically stable today. No new or progressive deficits following first cycle of adjuvant Temodar.   We ultimately recommended continuing treatment with cycle #2Temozolomide dose increased to 274m/m2, on for five days and off for twenty three days in twenty eight day cycles. The patient will have a complete blood count performed on days 21 and 28 of each cycle, and a comprehensive metabolic panel performed on day 28 of each cycle. Labs may need to be  performed more often. Zofran will prescribed for home use for nausea/vomiting.  We reviewed side effects of Temodar including nausea/vomiting, fatigue, cytopenias, constipation.  Chemotherapy should be held for the following:  ANC less than 1,000  Platelets less than 100,000  LFT or creatinine greater than 2x ULN  If clinical concerns/contraindications develop  Will decrease decadron to 444mdaily now, then 6m7maily in 14 days.  Will con't Keppra 500m44mr seizure prevention.  We ask that HermEBEN CHOINSKIurn to clinic in 1 months following next brain MRI, or sooner as needed.  All questions were answered. The patient knows to call the clinic with any problems, questions or concerns. No barriers to learning were detected.  I have spent a total of 30 minutes of face-to-face and non-face-to-face time, excluding clinical staff time, preparing to see patient, ordering tests and/or medications, counseling the patient, and independently interpreting results and communicating results to the patient/family/caregiver     ZachVentura Sellers Medical Director of Neuro-Oncology [facility] GroupWeslMartin12/21 9:43 AM

## 2019-12-01 ENCOUNTER — Telehealth: Payer: Self-pay | Admitting: Internal Medicine

## 2019-12-01 MED FILL — TEMOZOLOMIDE 140 MG CAPS: 140 | 5 days supply | Qty: 15 | Fill #0

## 2019-12-01 NOTE — Telephone Encounter (Signed)
Scheduled per 8/12 los. Unable to reach pt. Left voicemail with appt time and date.

## 2019-12-04 ENCOUNTER — Telehealth: Payer: Self-pay | Admitting: *Deleted

## 2019-12-04 NOTE — Telephone Encounter (Signed)
Received after hours telephone advice.    12/02/2019 Patient was unable to stand this morning, has been decreasing steroids and seems to happen when they decrease the medication.  No other symptoms.  Call was routed to Dr [name] On call provider.  Was advised to increase his Decadron dose back up to his normal dosage of 4 mg in the morning and 2 mg in the evening.   12/04/2019 Called wife to check on patient.  She stated that improvement was seen the following day.

## 2019-12-05 ENCOUNTER — Telehealth: Payer: Self-pay | Admitting: *Deleted

## 2019-12-05 ENCOUNTER — Encounter: Payer: Self-pay | Admitting: Occupational Therapy

## 2019-12-05 ENCOUNTER — Other Ambulatory Visit: Payer: Self-pay

## 2019-12-05 ENCOUNTER — Ambulatory Visit: Payer: Medicare Other | Admitting: Occupational Therapy

## 2019-12-05 DIAGNOSIS — M6281 Muscle weakness (generalized): Secondary | ICD-10-CM

## 2019-12-05 DIAGNOSIS — R41842 Visuospatial deficit: Secondary | ICD-10-CM

## 2019-12-05 DIAGNOSIS — R41844 Frontal lobe and executive function deficit: Secondary | ICD-10-CM

## 2019-12-05 DIAGNOSIS — R41840 Attention and concentration deficit: Secondary | ICD-10-CM | POA: Diagnosis not present

## 2019-12-05 DIAGNOSIS — R2689 Other abnormalities of gait and mobility: Secondary | ICD-10-CM

## 2019-12-05 DIAGNOSIS — R278 Other lack of coordination: Secondary | ICD-10-CM

## 2019-12-05 DIAGNOSIS — H53462 Homonymous bilateral field defects, left side: Secondary | ICD-10-CM

## 2019-12-05 DIAGNOSIS — R2681 Unsteadiness on feet: Secondary | ICD-10-CM | POA: Diagnosis not present

## 2019-12-05 NOTE — Telephone Encounter (Signed)
Patients wife called to report that they did increase Steroid dose back up this weekend when they called after hours and the triage team got instructions from [name].    She stated that that first day she saw a lot of improvement but yesterday and today the patient has started to have more weakness and close calls with falling.  Routed to MD to advise if sooner visit is warranted or if dose changes need to be made.

## 2019-12-05 NOTE — Therapy (Signed)
[facility] [address] [place], [place], [address] Phone: [phone]   Fax:  [phone]  Occupational Therapy Treatment  Patient Details  Name: [name] MRN: [id number] Date of Birth: 06-16-1945 Referring Provider (OT): Dr. Bertis Ruddy   Encounter Date: 12/05/2019   OT End of Session - 12/05/19 1248    Visit Number 11    Number of Visits 25    Date for OT Re-Evaluation 01/23/20    Authorization Type MCR primary, AARP secondary    Authorization Time Period 90 days, may d/c after 8 weeks dep on progress.    Authorization - Visit Number 11    Authorization - Number of Visits 20    Progress Note Due on Visit 20    OT Start Time 1237    OT Stop Time 1318    OT Time Calculation (min) 41 min    Activity Tolerance Patient tolerated treatment well    Behavior During Therapy WFL for tasks assessed/performed           Past Medical History:  Diagnosis Date  . Anxiety   . Arthritis   . ASCVD (arteriosclerotic cardiovascular disease)    03/2004: DES x2 to the RCA; IMI with stent occlusion in 02/2005- suboptimal Plavix compliance  . CAD (coronary artery disease)   . Carcinoma of prostate ([place])    Clopidogrel held for biopsy  . Constipation due to pain medication   . Diabetes mellitus without complication ([place])   . Diabetic neuropathy ([place])   . Headache 2021  . Hyperlipidemia   . Hypertension   . Mild depression ([place])   . Morbid obesity ([place])   . Myocardial infarction ([place])   . Shortness of breath dyspnea   . Sleep apnea    no longer uses cpap  . Stroke ([place]) 2017  . Tobacco abuse    stopped smoking 06/28/09    Past Surgical History:  Procedure Laterality Date  . APPLICATION OF CRANIAL NAVIGATION N/A 07/04/2019   Procedure: APPLICATION OF CRANIAL NAVIGATION;  Surgeon: Ashok Pall, MD;  Location: [facility];  Service: Neurosurgery;  Laterality: N/A;  . BACK SURGERY    . CARDIAC CATHETERIZATION      X 1 stent before having CABG  . CORONARY ARTERY BYPASS GRAFT  06/28/2008   X4  . CRANIOTOMY Right 07/04/2019   Procedure: Right occipital craniotomy for tumor with brainlab;  Surgeon: Ashok Pall, MD;  Location: [facility];  Service: Neurosurgery;  Laterality: Right;  . ENDARTERECTOMY Right 07/03/2015   Procedure: RIGHT CAROTID ENDARTERECTOMY WITH BOVINE PERICARDIUM PATCH ANGIOPLASTY;  Surgeon: Serafina Mitchell, MD;  Location: [facility];  Service: Vascular;  Laterality: Right;  . LUMBAR LAMINECTOMY/DECOMPRESSION MICRODISCECTOMY N/A 09/06/2014   Procedure: LUMBER DECOMPRESSION L3-5;  Surgeon: Melina Schools, MD;  Location: [facility];  Service: Orthopedics;  Laterality: N/A;  . LUMBAR SPINE SURGERY  2002  . PROSTATE BIOPSY    . PROSTATECTOMY  02/2007  . VASECTOMY      There were no vitals filed for this visit.   Subjective Assessment - 12/05/19 1246    Subjective  started chemo last night.  Having trouble with L foot today.    Patient is accompanied by: Family member   WIFE   Pertinent History [name] is a 74 y.o. 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, prostate cancer with radiation therapy  prostatectomy, CAD with CABG maintained on Xarelto, chronic diastolic congestive heart failure.  Lives with spouse two-level home he had been receiving outpatient therapies.  Presented 10/08/2019 with increasing left-sided weakness headache nausea vomiting as well as bouts of hematuria.   CT of the head showed new severe vasogenic edema throughout the posterior right cerebral hemisphere with 8 mm leftNo evidence of acute intracranial abnormality.  Follow-up medical oncology maintained on Decadron therapy.    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 No/denies              Attempted to copy small peg design with L hand.  Pt initiated design on R side with good accuracy, however, pt became confused as moved to the L and could keep place on design despite mod-max cueing.  Therefore, changed activities.    Sorting cards with visual scanning with mod visual, verbal cueing (guide to the L to ensure pt was scanning fully to the L).  Pt frequently lost "place" with activity and needed redirection.  Recommended performing this activity at home when pt demo difficulty with ambulation for small range environmental scanning.         OT Education - 12/05/19 1430    Education Details Recommended pt try to stay active physically and mentally even on "bad" days including visual scanning, reaching to the L in sitting, seated marching, etc.    Person(s) Educated Patient;Spouse    Methods Explanation    Comprehension Verbalized understanding            OT Short Term Goals - 11/24/19 1043      OT SHORT TERM GOAL #1   Title Pt to verbalize understanding of visual compensatory strategies to improve daily function including reading and environmental scanning    Time 4    Period Weeks    Status Achieved      OT SHORT TERM GOAL #2   Title Pt to perform tabletop scanning with 90% or greater accuracy using strategies    Baseline 85% for 1.5 M number cancellation    Time 4    Period Weeks    Status On-going   28/80 missed     OT SHORT TERM GOAL #3   Title Pt will read 3/4 page of text with accuracy, visual aids/AE and extra time PRN    Time 4    Period Weeks    Status Achieved   met with larger print double spaced and line guide     OT SHORT TERM GOAL #4   Title Pt will perform dressing with supervision/ set up    Baseline min A for shoes/ socks    Time 4    Period Weeks    Status On-going   ongoing, not perfroming at home     OT SHORT TERM GOAL #5   Title Pt will demonstrate improved LUE fine motor coordination for  ADLs as evidenced by decreasing 9 hole peg test score to 52 secs or less.    Baseline RUE 33 secs, LUE 58.25    Time 4    Period Weeks    Status On-going   11/21/19:  68.41sec     OT SHORT TERM GOAL #6   Title Pt will demonstrate ability to retrieve a llightweight object at 130 shoulder flexion with LUE demonstrating good control.    Baseline shoulder flexion  RUE 140, LUE 120    Time 4    Period Weeks    Status Achieved   11/21/19:  130*            OT Long Term Goals - 10/25/19 0747      OT LONG TERM GOAL #1   Title Pt will perform environmental scanning at 90% accuracy in mod distracting environment    Time 12    Period Weeks    Status New    Target Date 01/23/20      OT LONG TERM GOAL #2   Title Pt to read full page of text or greater with visual aids/AE prn    Time 12    Period Weeks    Status New      OT LONG TERM GOAL #3   Title Pt will demonstrate improved fine motor coordination in LUE for ADLS as evidenced by decreasing 9 hole peg test score to 48 secs or less.    Baseline Pt will perform all basic ADLS with distant supervision.    Time 12    Period Weeks    Status New      OT LONG TERM GOAL #4   Title Pt will perform basic home management tasks with supervision demonstrating good safety awareness.    Time 12    Period Weeks    Status New                 Plan - 12/05/19 1249    Clinical Impression Statement Pt's progress is affected by diagnosis and ongoing treatment (incr dosage and new round of chemo started last night) and medication adjustment.  Pt demo incr difficulty with visual scanning today, following directions, and incr difficulty with moving LLE.    Occupational performance deficits (Please refer to evaluation for details): IADL's;Work;Leisure;Social Participation;ADL's    Body Structure / Function / Physical Skills IADL;Endurance;Balance;Vision;ADL;Mobility;Strength;UE functional use;FMC;Coordination;Gait;GMC;ROM;Decreased knowledge of use of  DME;Dexterity    Cognitive Skills Memory;Problem Solve;Perception;Thought;Safety Awareness    Comorbidities impacting occupational performance description: glioblastoma - pt has begun radiation    OT Frequency 2x / week    OT Duration 12 weeks    OT Treatment/Interventions Self-care/ADL training;Functional Mobility Training;Therapeutic activities;Coping strategies training;Visual/perceptual remediation/compensation;Patient/family education;Cognitive remediation/compensation;Therapeutic exercise;Balance training;Manual Therapy;Neuromuscular education;Aquatic Therapy;Energy conservation;DME and/or AE instruction;Paraffin;Fluidtherapy;Gait Training;Passive range of motion;Moist Heat    Plan walker negotiation and safety, simple environmental scanning, scanning cabinets/refrigerator, scanning to use microwave, etc    Consulted and Agree with Plan of Care Patient;Family member/caregiver    Family Member Consulted wife           Patient will benefit from skilled therapeutic intervention in order to improve the following deficits and impairments:   Body Structure / Function / Physical Skills: IADL, Endurance, Balance, Vision, ADL, Mobility, Strength, UE functional use, FMC, Coordination, Gait, GMC, ROM, Decreased knowledge of use of DME, Dexterity Cognitive Skills: Memory, Problem Solve, Perception, Thought, Safety Awareness     Visit Diagnosis: Visuospatial deficit  Attention and concentration deficit  Frontal lobe and executive function deficit  Homonymous hemianopsia, left  Muscle weakness (generalized)  Unsteadiness on feet  Other abnormalities of gait and mobility  Other lack of coordination    Problem List Patient Active Problem List   Diagnosis Date Noted  . Glioblastoma multiforme ([facility]) 10/11/2019  . Dyslipidemia   . Seizure prophylaxis   . Chronic diastolic congestive heart failure ([place])   . Diabetic peripheral neuropathy ([place])   . Palliative care by specialist   .  Weakness 10/08/2019  . Vasogenic edema ([facility]) 10/08/2019  . Gross hematuria 10/08/2019  . DNR (do not resuscitate) discussion 07/13/2019  . S/P craniotomy 07/04/2019  . Glioblastoma with isocitrate dehydrogenase gene wildtype ([place]) 07/04/2019  . Respiratory failure ([place])   . Encephalopathy acute   . Brain mass 07/01/2019  . Fever 07/01/2019  . Atherosclerosis of native artery of both lower extremities with intermittent claudication ([place]) 08/23/2018  . Coronary artery disease without angina pectoris 08/23/2018  . Dyspnea on exertion 08/23/2018  . Biochemically recurrent malignant neoplasm of prostate 08/16/2018  . S/P carotid endarterectomy 08/20/2015  . Essential hypertension 08/20/2015  . Type 2 diabetes mellitus with circulatory disorder ([facility]) 08/20/2015  . HLD (hyperlipidemia) 08/20/2015  . Carotid artery stenosis, symptomatic 07/03/2015  . Carotid stenosis, right   . TIA (transient ischemic attack) 06/18/2015  . ASCVD (arteriosclerotic cardiovascular disease) 06/18/2015  . Diabetes mellitus without complication ([place]) 84/16/6063  . Resistant hypertension 06/18/2015  . Hyperlipidemia 06/18/2015  . Spinal stenosis of lumbar region with neurogenic claudication 09/06/2014  . Atherosclerosis of native artery of extremity with intermittent claudication ([place]) 03/09/2013  . HYPERLIPIDEMIA 04/08/2009  . TOBACCO ABUSE 04/08/2009  . ATHEROSCLEROTIC CARDIOVASCULAR DISEASE 04/08/2009  . INTERMITTENT VERTIGO 04/08/2009  . ADENOCARCINOMA, PROSTATE 03/21/2009  . ABDOMINAL PAIN -GENERALIZED 03/21/2009  . History of coronary artery bypass graft 06/28/2008    [facility] 12/05/2019, 2:33 PM  [facility] [address] [place], [place], [address] Phone: [phone]   Fax:  [phone]  Name: ARPAN ESKELSON MRN: [id number] Date of Birth: [birth year]   Vianne Bulls, OTR/L [facility] [address]  [place], [place]  [address] [phone] phone [phone] 12/05/19 2:33 PM

## 2019-12-06 NOTE — Telephone Encounter (Signed)
Patient's wife called office with update and wanted to make you aware. Stated patient's weakness has improved since yesterday and was able to participate in OT session.

## 2019-12-07 ENCOUNTER — Ambulatory Visit: Payer: Medicare Other

## 2019-12-07 ENCOUNTER — Other Ambulatory Visit: Payer: Self-pay

## 2019-12-07 DIAGNOSIS — R41844 Frontal lobe and executive function deficit: Secondary | ICD-10-CM | POA: Diagnosis not present

## 2019-12-07 DIAGNOSIS — R41840 Attention and concentration deficit: Secondary | ICD-10-CM | POA: Diagnosis not present

## 2019-12-07 DIAGNOSIS — R2681 Unsteadiness on feet: Secondary | ICD-10-CM | POA: Diagnosis not present

## 2019-12-07 DIAGNOSIS — C719 Malignant neoplasm of brain, unspecified: Secondary | ICD-10-CM

## 2019-12-07 DIAGNOSIS — R42 Dizziness and giddiness: Secondary | ICD-10-CM

## 2019-12-07 DIAGNOSIS — R278 Other lack of coordination: Secondary | ICD-10-CM | POA: Diagnosis not present

## 2019-12-07 DIAGNOSIS — R2689 Other abnormalities of gait and mobility: Secondary | ICD-10-CM

## 2019-12-07 DIAGNOSIS — R41842 Visuospatial deficit: Secondary | ICD-10-CM | POA: Diagnosis not present

## 2019-12-07 DIAGNOSIS — M6281 Muscle weakness (generalized): Secondary | ICD-10-CM | POA: Diagnosis not present

## 2019-12-07 NOTE — Therapy (Signed)
[facility] [address] [place], [place], [address] Phone: [phone]   Fax:  [phone]  Physical Therapy Treatment  Patient Details  Name: [name] MRN: [id number] Date of Birth: [birth year] Referring Provider (PT): [name]   Encounter Date: 12/07/2019   PT End of Session - 12/07/19 1841    Visit Number 11    Number of Visits 17    Date for PT Re-Evaluation 12/19/19    Authorization Type 10 V PN on 11/29/19    Progress Note Due on Visit 20    PT Start Time 1145    PT Stop Time 1235    PT Time Calculation (min) 50 min    Equipment Utilized During Treatment Gait belt    Activity Tolerance Patient tolerated treatment well;Patient limited by fatigue    Behavior During Therapy [facility] for tasks assessed/performed           Past Medical History:  Diagnosis Date  . Anxiety   . Arthritis   . ASCVD (arteriosclerotic cardiovascular disease)    03/2004: DES x2 to the RCA; IMI with stent occlusion in 02/2005- suboptimal Plavix compliance  . CAD (coronary artery disease)   . Carcinoma of prostate ([place])    Clopidogrel held for biopsy  . Constipation due to pain medication   . Diabetes mellitus without complication ([place])   . Diabetic neuropathy ([place])   . Headache 2021  . Hyperlipidemia   . Hypertension   . Mild depression ([place])   . Morbid obesity ([place])   . Myocardial infarction ([place])   . Shortness of breath dyspnea   . Sleep apnea    no longer uses cpap  . Stroke ([place]) 2017  . Tobacco abuse    stopped smoking 06/28/09    Past Surgical History:  Procedure Laterality Date  . APPLICATION OF CRANIAL NAVIGATION N/A 07/04/2019   Procedure: APPLICATION OF CRANIAL NAVIGATION;  Surgeon: Ashok Pall, MD;  Location: [facility];  Service: Neurosurgery;  Laterality: N/A;  . BACK SURGERY    . CARDIAC CATHETERIZATION     X 1 stent before having CABG  . CORONARY ARTERY BYPASS GRAFT  06/28/2008   X4  . CRANIOTOMY  Right 07/04/2019   Procedure: Right occipital craniotomy for tumor with brainlab;  Surgeon: Ashok Pall, MD;  Location: [facility];  Service: Neurosurgery;  Laterality: Right;  . ENDARTERECTOMY Right 07/03/2015   Procedure: RIGHT CAROTID ENDARTERECTOMY WITH BOVINE PERICARDIUM PATCH ANGIOPLASTY;  Surgeon: Serafina Mitchell, MD;  Location: [facility];  Service: Vascular;  Laterality: Right;  . LUMBAR LAMINECTOMY/DECOMPRESSION MICRODISCECTOMY N/A 09/06/2014   Procedure: LUMBER DECOMPRESSION L3-5;  Surgeon: Melina Schools, MD;  Location: [facility];  Service: Orthopedics;  Laterality: N/A;  . LUMBAR SPINE SURGERY  2002  . PROSTATE BIOPSY    . PROSTATECTOMY  02/2007  . VASECTOMY      There were no vitals filed for this visit.   Subjective Assessment - 12/07/19 1835    Subjective Wife and patient reports that doctors are trying to wean him off the steroids. 2 days after stopping steroids, he was trying to get out of bed and couldn't hold him self and his wife had to lower him to floor. He couldn't get up and they had to call his son to pick him up off the floor. They called MD and MD started him back on evening dose of steroids which hashelped with his strength but patient reports he doesn't feel as strong and feels more  tired faster.    Patient is accompained by: Family member    Pertinent History PMH: Rt occipital craniotomy 07/04/19 secondary to glioblastoma. PMH: Prostate CA, CVA 2017 (no residual deficits), CAD, DM, HTN, hx of 2 lumbar surgeries 2002, 2016 (laminectomies)    Limitations Standing;Walking    How long can you sit comfortably? no issues    How long can you stand comfortably? 5 min    How long can you walk comfortably? 5 min    Patient Stated Goals walk better                   Treatment: please see clinical impression statement below for detailed treatment.                    PT Short Term Goals - 11/29/19 1351      PT SHORT TERM GOAL #1   Title Pt will be able to  perform 5x sit to stand under 30 seconds with one UE to improve functional strength from standard chair.    Baseline Pt unable to perform them (10/24/19); unable to without UE support (11/24/19); attempted with one UE but pt only able to complete 1 rep with multiple tries    Time 4    Period Weeks    Status On-going    Target Date 12/22/19      PT SHORT TERM GOAL #2   Title Patient will be able to ambulate for 10 min without rest with use of his walker to improve walking endurnace    Baseline <5 min (10/24/19); 5-10 min (11/29/19)    Time 4    Period Weeks    Status Achieved    Target Date 11/21/19      PT SHORT TERM GOAL #3   Title Pt will demo 4 points improvement on Berg balance scale to improve overall static balance    Baseline 37/56 (10/24/19); 8/6 42/56    Time 4    Period Weeks    Status Achieved    Target Date 11/21/19             PT Long Term Goals - 11/29/19 1353      PT LONG TERM GOAL #1   Title Pt will demo 47/56 or better on BBS to improve balance and reduce fall risk    Baseline 37/26 (10/24/19);11/24/19 42/56    Time 8    Period Weeks    Status On-going      PT LONG TERM GOAL #2   Title Patient will be able to perform sit to stand in under 25 seconds to improve overall strength    Baseline unable (10/24/19); needs to use bil UE to complete sit to stand (33 sec)    Time 8    Period Weeks    Status New      PT LONG TERM GOAL #3   Title Pt will be able to ambulate 100' without AD to improve household ambulation without AD    Baseline Pt uses cane in house (Eval); 115' with CGA without AD    Time 8    Period Weeks    Status Achieved      PT LONG TERM GOAL #4   Title Pt will demo improvement by 0.15 m/s with use of st. cane to improve functional gait velocity    Baseline 0.20m/s with cane, 0.80m/s with rolling walker (eval)    Time 8    Period Weeks    Status New  Plan - 12/07/19 1837    Clinical Impression Statement Today's skilled  session was focused on improving sit to stand transfers from kitchen table. wife and patient reported that he has really hard time getting up from dining table chair. Wife showed picture of patient sitting on dining table chair for presepective. The way patient was sitting in picture, he would have to stand up pushing off with left arm on table and R arm on walker. We tried standing up from mat table with pushin off with R hand on mat table and left hand on walker. Patient was able to stand up with 2-3 tries. When asked to push off with left hand on mat table and right hand on walker, pt was unable to stand up without any assistance from PT. Patient and wife was then educated that they should change the sitting arrangement at home so when he stands up, table would b on his right side so he can push off the table and stand up. Patient was then takne over to dining table and chair was placed in a way that he can push off with R hand with left hand on walker. Patient was unable to push off from table which is the same height as arm rest. Patient then was asked to push off from arm rest on R and he was able to stand up. We practiced sit to stands in various chairs for 6-7 times total and towards the end of the session, patient was feeling legs being shakey. Patient did not feel like that he could walk to the car. Patient was then taken to his car  in wheelchair and patient used walker to walk 10 feet to sit in the car with min A.    Personal Factors and Comorbidities Comorbidity 3+    Comorbidities craniotomy secondary to glioblastoma, hx of lumbar surgeries x 2, CABG x 4    Examination-Activity Limitations Squat;Stairs;Locomotion Level;Caring for Others;Carry    Examination-Participation Restrictions Cleaning;Community Activity;Driving;Laundry;Yard Work;Shop    Stability/Clinical Decision Making Unstable/Unpredictable    Rehab Potential Good    PT Frequency 2x / week    PT Duration 8 weeks    PT  Treatment/Interventions ADLs/Self Care Home Management;Gait training;Stair training;Functional mobility training;Therapeutic activities;Therapeutic exercise;Balance training;Neuromuscular re-education;Vestibular;Visual/perceptual remediation/compensation    PT Next Visit Plan Reassess goals, practice sit to stand    PT Home Exercise Plan SLS, sit to stand without HHA, Access Code: [email or web address]: [email or web address]: 06/03/2021Prepared by: Gwenyth Bouillon PatelExercisesStanding Balance in Corner with Eyes Closed - 1 x daily - 7 x weekly - 2 sets - 10 reps           Patient will benefit from skilled therapeutic intervention in order to improve the following deficits and impairments:  Abnormal gait, Decreased activity tolerance, Decreased balance, Decreased mobility, Decreased endurance, Difficulty walking, Dizziness, Impaired vision/preception  Visit Diagnosis: Muscle weakness (generalized)  Unsteadiness on feet  Other abnormalities of gait and mobility  Dizziness and giddiness  Glioblastoma multiforme (HCC)     Problem List Patient Active Problem List   Diagnosis Date Noted  . Glioblastoma multiforme ([place]) 10/11/2019  . Dyslipidemia   . Seizure prophylaxis   . Chronic diastolic congestive heart failure ([place])   . Diabetic peripheral neuropathy ([place])   . Palliative care by specialist   . Weakness 10/08/2019  . Vasogenic edema ([place]) 10/08/2019  . Gross hematuria 10/08/2019  . DNR (do not resuscitate) discussion 07/13/2019  . S/P craniotomy 07/04/2019  . Glioblastoma with isocitrate dehydrogenase gene wildtype (HCC)  07/04/2019  . Respiratory failure ([place])   . Encephalopathy acute   . Brain mass 07/01/2019  . Fever 07/01/2019  . Atherosclerosis of native artery of both lower extremities with intermittent claudication ([place]) 08/23/2018  . Coronary artery disease without angina pectoris 08/23/2018  . Dyspnea on exertion 08/23/2018  . Biochemically recurrent malignant  neoplasm of prostate 08/16/2018  . S/P carotid endarterectomy 08/20/2015  . Essential hypertension 08/20/2015  . Type 2 diabetes mellitus with circulatory disorder ([place]) 08/20/2015  . HLD (hyperlipidemia) 08/20/2015  . Carotid artery stenosis, symptomatic 07/03/2015  . Carotid stenosis, right   . TIA (transient ischemic attack) 06/18/2015  . ASCVD (arteriosclerotic cardiovascular disease) 06/18/2015  . Diabetes mellitus without complication ([place]) 91/69/4503  . Resistant hypertension 06/18/2015  . Hyperlipidemia 06/18/2015  . Spinal stenosis of lumbar region with neurogenic claudication 09/06/2014  . Atherosclerosis of native artery of extremity with intermittent claudication ([place]) 03/09/2013  . HYPERLIPIDEMIA 04/08/2009  . TOBACCO ABUSE 04/08/2009  . ATHEROSCLEROTIC CARDIOVASCULAR DISEASE 04/08/2009  . INTERMITTENT VERTIGO 04/08/2009  . ADENOCARCINOMA, PROSTATE 03/21/2009  . ABDOMINAL PAIN -GENERALIZED 03/21/2009  . History of coronary artery bypass graft 06/28/2008    Kerrie Pleasure PT 12/07/2019, 6:42 PM  [facility] [address] [place], [place], [address] Phone: [phone]   Fax:  [phone]  Name: [name] MRN: [id number] Date of Birth: 17-Nov-1945

## 2019-12-12 ENCOUNTER — Ambulatory Visit: Payer: Medicare Other | Admitting: Occupational Therapy

## 2019-12-12 ENCOUNTER — Ambulatory Visit: Payer: Medicare Other

## 2019-12-12 ENCOUNTER — Other Ambulatory Visit: Payer: Self-pay

## 2019-12-12 DIAGNOSIS — H2513 Age-related nuclear cataract, bilateral: Secondary | ICD-10-CM | POA: Diagnosis not present

## 2019-12-12 DIAGNOSIS — H35033 Hypertensive retinopathy, bilateral: Secondary | ICD-10-CM | POA: Diagnosis not present

## 2019-12-12 DIAGNOSIS — M6281 Muscle weakness (generalized): Secondary | ICD-10-CM | POA: Diagnosis not present

## 2019-12-12 DIAGNOSIS — H5203 Hypermetropia, bilateral: Secondary | ICD-10-CM | POA: Diagnosis not present

## 2019-12-12 DIAGNOSIS — R41842 Visuospatial deficit: Secondary | ICD-10-CM | POA: Diagnosis not present

## 2019-12-12 DIAGNOSIS — C719 Malignant neoplasm of brain, unspecified: Secondary | ICD-10-CM

## 2019-12-12 DIAGNOSIS — R41840 Attention and concentration deficit: Secondary | ICD-10-CM | POA: Diagnosis not present

## 2019-12-12 DIAGNOSIS — R2681 Unsteadiness on feet: Secondary | ICD-10-CM

## 2019-12-12 DIAGNOSIS — R41844 Frontal lobe and executive function deficit: Secondary | ICD-10-CM | POA: Diagnosis not present

## 2019-12-12 DIAGNOSIS — H40013 Open angle with borderline findings, low risk, bilateral: Secondary | ICD-10-CM | POA: Diagnosis not present

## 2019-12-12 DIAGNOSIS — R278 Other lack of coordination: Secondary | ICD-10-CM | POA: Diagnosis not present

## 2019-12-12 DIAGNOSIS — E119 Type 2 diabetes mellitus without complications: Secondary | ICD-10-CM | POA: Diagnosis not present

## 2019-12-12 DIAGNOSIS — R42 Dizziness and giddiness: Secondary | ICD-10-CM

## 2019-12-12 DIAGNOSIS — R2689 Other abnormalities of gait and mobility: Secondary | ICD-10-CM

## 2019-12-12 DIAGNOSIS — H53453 Other localized visual field defect, bilateral: Secondary | ICD-10-CM | POA: Diagnosis not present

## 2019-12-12 NOTE — Therapy (Signed)
[facility] [address] [place], [place], [address] Phone: [phone]   Fax:  [phone]  Physical Therapy Treatment  Patient Details  Name: [name] MRN: [id number] Date of Birth: January 26, 1946 Referring Provider (PT): [name]   Encounter Date: 12/12/2019   PT End of Session - 12/12/19 1353    Visit Number 12    Number of Visits 17    Date for PT Re-Evaluation 12/19/19    Authorization Type 10 V PN on 11/29/19    Progress Note Due on Visit 20    PT Start Time 1315    PT Stop Time 1400    PT Time Calculation (min) 45 min    Equipment Utilized During Treatment Gait belt    Activity Tolerance Patient tolerated treatment well;Patient limited by fatigue    Behavior During Therapy [facility] for tasks assessed/performed           Past Medical History:  Diagnosis Date  . Anxiety   . Arthritis   . ASCVD (arteriosclerotic cardiovascular disease)    03/2004: DES x2 to the RCA; IMI with stent occlusion in 02/2005- suboptimal Plavix compliance  . CAD (coronary artery disease)   . Carcinoma of prostate ([place])    Clopidogrel held for biopsy  . Constipation due to pain medication   . Diabetes mellitus without complication ([place])   . Diabetic neuropathy ([place])   . Headache 2021  . Hyperlipidemia   . Hypertension   . Mild depression ([place])   . Morbid obesity ([place])   . Myocardial infarction ([place])   . Shortness of breath dyspnea   . Sleep apnea    no longer uses cpap  . Stroke ([place]) 2017  . Tobacco abuse    stopped smoking 06/28/09    Past Surgical History:  Procedure Laterality Date  . APPLICATION OF CRANIAL NAVIGATION N/A 07/04/2019   Procedure: APPLICATION OF CRANIAL NAVIGATION;  Surgeon: Ashok Pall, MD;  Location: [place];  Service: Neurosurgery;  Laterality: N/A;  . BACK SURGERY    . CARDIAC CATHETERIZATION     X 1 stent before having CABG  . CORONARY ARTERY BYPASS GRAFT  06/28/2008   X4  . CRANIOTOMY  Right 07/04/2019   Procedure: Right occipital craniotomy for tumor with brainlab;  Surgeon: Ashok Pall, MD;  Location: [facility];  Service: Neurosurgery;  Laterality: Right;  . ENDARTERECTOMY Right 07/03/2015   Procedure: RIGHT CAROTID ENDARTERECTOMY WITH BOVINE PERICARDIUM PATCH ANGIOPLASTY;  Surgeon: Serafina Mitchell, MD;  Location: [place];  Service: Vascular;  Laterality: Right;  . LUMBAR LAMINECTOMY/DECOMPRESSION MICRODISCECTOMY N/A 09/06/2014   Procedure: LUMBER DECOMPRESSION L3-5;  Surgeon: Melina Schools, MD;  Location: [facility];  Service: Orthopedics;  Laterality: N/A;  . LUMBAR SPINE SURGERY  2002  . PROSTATE BIOPSY    . PROSTATECTOMY  02/2007  . VASECTOMY      There were no vitals filed for this visit.   Subjective Assessment - 12/12/19 1327    Subjective no new complaints    Patient is accompained by: Family member    Pertinent History PMH: Rt occipital craniotomy 07/04/19 secondary to glioblastoma. PMH: Prostate CA, CVA 2017 (no residual deficits), CAD, DM, HTN, hx of 2 lumbar surgeries 2002, 2016 (laminectomies)    Limitations Standing;Walking    How long can you sit comfortably? no issues    How long can you stand comfortably? 5 min    How long can you walk comfortably? 5 min    Patient Stated Goals  walk better                     Treatment: Sit to stand: using R UE on table and L UE on walker: 5x; using R UE on arm rest and L UE on chair: 3x Gait training: 530' with rw SBA                  PT Short Term Goals - 12/12/19 1350      PT SHORT TERM GOAL #1   Title Pt will be able to perform 5x sit to stand under 30 seconds with one UE to improve functional strength from standard chair.    Baseline Pt unable to perform them (10/24/19); unable to without UE support (11/24/19); attempted with one UE but pt only able to complete 1 rep with multiple tries; pt was able to stand up using one UE on chair and second hand on walker (12/12/19)    Time 4    Period Weeks     Status On-going    Target Date 12/22/19      PT SHORT TERM GOAL #2   Title Patient will be able to ambulate for 10 min without rest with use of his walker to improve walking endurnace    Baseline <5 min (10/24/19); 5-10 min (11/29/19)    Time 4    Period Weeks    Status Achieved    Target Date 11/21/19      PT SHORT TERM GOAL #3   Title Pt will demo 4 points improvement on Berg balance scale to improve overall static balance    Baseline 37/56 (10/24/19); 8/6 42/56    Time 4    Period Weeks    Status Achieved    Target Date 11/21/19             PT Long Term Goals - 11/29/19 1353      PT LONG TERM GOAL #1   Title Pt will demo 47/56 or better on BBS to improve balance and reduce fall risk    Baseline 37/26 (10/24/19);11/24/19 42/56    Time 8    Period Weeks    Status On-going      PT LONG TERM GOAL #2   Title Patient will be able to perform sit to stand in under 25 seconds to improve overall strength    Baseline unable (10/24/19); needs to use bil UE to complete sit to stand (33 sec)    Time 8    Period Weeks    Status New      PT LONG TERM GOAL #3   Title Pt will be able to ambulate 100' without AD to improve household ambulation without AD    Baseline Pt uses cane in house (Eval); 115' with CGA without AD    Time 8    Period Weeks    Status Achieved      PT LONG TERM GOAL #4   Title Pt will demo improvement by 0.15 m/s with use of st. cane to improve functional gait velocity    Baseline 0.47m/s with cane, 0.3m/s with rolling walker (eval)    Time 8    Period Weeks    Status New                 Plan - 12/12/19 1352    Clinical Impression Statement Pt demonstrated improved ability to get up from chair with pushing off from table on R side and also with use  of R arm rest on chair and second hand on walker. Patient demonstrated improved wlaking endurance to 530' with RW.    Personal Factors and Comorbidities Comorbidity 3+    Comorbidities craniotomy secondary to  glioblastoma, hx of lumbar surgeries x 2, CABG x 4    Examination-Activity Limitations Squat;Stairs;Locomotion Level;Caring for Others;Carry    Examination-Participation Restrictions Cleaning;Community Activity;Driving;Laundry;Yard Work;Shop    Stability/Clinical Decision Making Unstable/Unpredictable    Rehab Potential Good    PT Frequency 2x / week    PT Duration 8 weeks    PT Treatment/Interventions ADLs/Self Care Home Management;Gait training;Stair training;Functional mobility training;Therapeutic activities;Therapeutic exercise;Balance training;Neuromuscular re-education;Vestibular;Visual/perceptual remediation/compensation    PT Next Visit Plan Reassess goals, practice sit to stand    PT Home Exercise Plan SLS, sit to stand without HHA, Access Code: [email or web address]: [email or web address]: 06/03/2021Prepared by: Gwenyth Bouillon PatelExercisesStanding Balance in Corner with Eyes Closed - 1 x daily - 7 x weekly - 2 sets - 10 reps           Patient will benefit from skilled therapeutic intervention in order to improve the following deficits and impairments:  Abnormal gait, Decreased activity tolerance, Decreased balance, Decreased mobility, Decreased endurance, Difficulty walking, Dizziness, Impaired vision/preception  Visit Diagnosis: Muscle weakness (generalized)  Unsteadiness on feet  Other abnormalities of gait and mobility  Dizziness and giddiness  Glioblastoma multiforme (HCC)     Problem List Patient Active Problem List   Diagnosis Date Noted  . Glioblastoma multiforme ([place]) 10/11/2019  . Dyslipidemia   . Seizure prophylaxis   . Chronic diastolic congestive heart failure ([place])   . Diabetic peripheral neuropathy ([place])   . Palliative care by specialist   . Weakness 10/08/2019  . Vasogenic edema ([place]) 10/08/2019  . Gross hematuria 10/08/2019  . DNR (do not resuscitate) discussion 07/13/2019  . S/P craniotomy 07/04/2019  . 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 Respiratory failure ([place])   . Encephalopathy acute   . Brain mass 07/01/2019  . Fever 07/01/2019  . Atherosclerosis of native artery of both lower extremities with intermittent claudication ([place]) 08/23/2018  . Coronary artery disease without angina pectoris 08/23/2018  . Dyspnea on exertion 08/23/2018  . Biochemically recurrent malignant neoplasm of prostate 08/16/2018  . S/P carotid endarterectomy 08/20/2015  . Essential hypertension 08/20/2015  . 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 HLD (hyperlipidemia) 08/20/2015  . Carotid artery stenosis, symptomatic 07/03/2015  . Carotid stenosis, right   . TIA (transient ischemic attack) 06/18/2015  . ASCVD (arteriosclerotic cardiovascular disease) 06/18/2015  . Diabetes mellitus without complication ([place]) 81/44/8185  . Resistant hypertension 06/18/2015  . Hyperlipidemia 06/18/2015  . Spinal stenosis of lumbar region with neurogenic claudication 09/06/2014  . Atherosclerosis of native artery of extremity with intermittent claudication ([place]) 03/09/2013  . HYPERLIPIDEMIA 04/08/2009  . TOBACCO ABUSE 04/08/2009  . ATHEROSCLEROTIC CARDIOVASCULAR DISEASE 04/08/2009  . INTERMITTENT VERTIGO 04/08/2009  . ADENOCARCINOMA, PROSTATE 03/21/2009  . ABDOMINAL PAIN -GENERALIZED 03/21/2009  . History of coronary artery bypass graft 06/28/2008    [name] 12/12/2019, 2:02 PM  [facility] [address] [place], [place], [address] Phone: [phone]   Fax:  [phone]  Name: [name] MRN: [id number] Date of Birth: 11-20-1945

## 2019-12-12 NOTE — Therapy (Signed)
[facility] [address] [place], [place], [address] Phone: [phone]   Fax:  [phone]  Occupational Therapy Treatment  Patient Details  Name: [name] MRN: [id number] Date of Birth: 1946/04/07 Referring Provider (OT): Dr. Lise Auer   Encounter Date: 12/12/2019   OT End of Session - 12/12/19 1419    Visit Number 12    Number of Visits 25    Date for OT Re-Evaluation 01/23/20    Authorization Type MCR primary, AARP secondary    Authorization Time Period 90 days, may d/c after 8 weeks dep on progress.    Authorization - Visit Number 12    Authorization - Number of Visits 20    Progress Note Due on Visit 20    OT Start Time 1230    OT Stop Time 1315    OT Time Calculation (min) 45 min    Activity Tolerance Patient tolerated treatment well    Behavior During Therapy WFL for tasks assessed/performed           Past Medical History:  Diagnosis Date  . Anxiety   . Arthritis   . ASCVD (arteriosclerotic cardiovascular disease)    03/2004: DES x2 to the RCA; IMI with stent occlusion in 02/2005- suboptimal Plavix compliance  . CAD (coronary artery disease)   . Carcinoma of prostate (HCC)    Clopidogrel held for biopsy  . Constipation due to pain medication   . Diabetes mellitus without complication (HCC)   . Diabetic neuropathy (HCC)   . Headache 2021  . Hyperlipidemia   . Hypertension   . Mild depression (HCC)   . Morbid obesity (HCC)   . Myocardial infarction (HCC)   . Shortness of breath dyspnea   . Sleep apnea    no longer uses cpap  . Stroke (HCC) 2017  . Tobacco abuse    stopped smoking 06/28/09    Past Surgical History:  Procedure Laterality Date  . APPLICATION OF CRANIAL NAVIGATION N/A 07/04/2019   Procedure: APPLICATION OF CRANIAL NAVIGATION;  Surgeon: Coletta Memos, MD;  [facility];  Service: Neurosurgery;  Laterality: N/A;  . BACK SURGERY    . CARDIAC CATHETERIZATION      X 1 stent before having CABG  . CORONARY ARTERY BYPASS GRAFT  06/28/2008   X4  . CRANIOTOMY Right 07/04/2019   Procedure: Right occipital craniotomy for tumor with brainlab;  Surgeon: Coletta Memos, MD;  [facility]: [facility];  Service: Neurosurgery;  Laterality: Right;  . ENDARTERECTOMY Right 07/03/2015   Procedure: RIGHT CAROTID ENDARTERECTOMY WITH BOVINE PERICARDIUM PATCH ANGIOPLASTY;  Surgeon: Nada Libman, MD;  Location[facility];  Service: Vascular;  Laterality: Right;  . LUMBAR LAMINECTOMY/DECOMPRESSION MICRODISCECTOMY N/A 09/06/2014   Procedure: LUMBER DECOMPRESSION L3-5;  Surgeon: Venita Lick, MD;  Location[facility];  Service: Orthopedics;  Laterality: N/A;  . LUMBAR SPINE SURGERY  2002  . PROSTATE BIOPSY    . PROSTATECTOMY  02/2007  . VASECTOMY      There were no vitals filed for this visit.   Subjective Assessment - 12/12/19 1236    Subjective  He finished up the chemo for this round - it's 23 days off    Patient is accompanied by: Family member   wife   Pertinent History [name] is a 74 y.o. 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 Barbaraann Cao, prostate cancer with  radiation therapy prostatectomy, CAD with CABG maintained on Xarelto, chronic diastolic congestive heart failure.  Lives with spouse two-level home he had been receiving outpatient therapies.  Presented 10/08/2019 with increasing left-sided weakness headache nausea vomiting as well as bouts of hematuria.   CT of the head showed new severe vasogenic edema throughout the posterior right cerebral hemisphere with 8 mm leftNo evidence of acute intracranial abnormality.  Follow-up medical oncology maintained on Decadron therapy.    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 No/denies           Pt performing walker negotiation in tight places w/ initial cues to scan near body and close environment - pt able to do without LOB and near misses, but overall better compared to when last assessed.  Pt scanning cabinets, practiced use of microwave, and scanning in refrigerator with cues to look fully Lt and for safety w/ walker negotiation and to prevent LOB.  Copying block designs with min to mod cueing prn and extra time required as pt often lost his place in pattern.                        OT Short Term Goals - 11/24/19 1043      OT SHORT TERM GOAL #1   Title Pt to verbalize understanding of visual compensatory strategies to improve daily function including reading and environmental scanning    Time 4    Period Weeks    Status Achieved      OT SHORT TERM GOAL #2   Title Pt to perform tabletop scanning with 90% or greater accuracy using strategies    Baseline 85% for 1.5 M number cancellation    Time 4    Period Weeks    Status On-going   28/80 missed     OT SHORT TERM GOAL #3   Title Pt will read 3/4 page of text with accuracy, visual aids/AE and extra time PRN    Time 4    Period Weeks    Status Achieved   met with larger print double spaced and line guide     OT SHORT TERM GOAL #4   Title Pt will perform dressing with supervision/ set up    Baseline min A for shoes/ socks    Time 4    Period Weeks    Status On-going   ongoing, not perfroming at home     OT SHORT TERM GOAL #5   Title Pt will demonstrate improved LUE fine motor coordination for ADLs as evidenced by decreasing 9 hole peg test score to 52 secs or less.    Baseline RUE 33 secs, LUE 58.25    Time 4    Period Weeks    Status On-going   11/21/19:  68.41sec     OT SHORT TERM GOAL #6   Title Pt will demonstrate ability to retrieve a llightweight object at 130 shoulder flexion with LUE demonstrating good control.    Baseline shoulder flexion RUE 140,  LUE 120    Time 4    Period Weeks    Status Achieved   11/21/19:  130*            OT Long Term Goals - 10/25/19 0747      OT LONG TERM GOAL #1   Title Pt will perform environmental  scanning at 90% accuracy in mod distracting environment    Time 12    Period Weeks    Status New    Target Date 01/23/20      OT LONG TERM GOAL #2   Title Pt to read full page of text or greater with visual aids/AE prn    Time 12    Period Weeks    Status New      OT LONG TERM GOAL #3   Title Pt will demonstrate improved fine motor coordination in LUE for ADLS as evidenced by decreasing 9 hole peg test score to 48 secs or less.    Baseline Pt will perform all basic ADLS with distant supervision.    Time 12    Period Weeks    Status New      OT LONG TERM GOAL #4   Title Pt will perform basic home management tasks with supervision demonstrating good safety awareness.    Time 12    Period Weeks    Status New                 Plan - 12/12/19 1420    Clinical Impression Statement Pt w/ mild improvements in balance and function today as he is now off chemo (this round).    Occupational performance deficits (Please refer to evaluation for details): IADL's;Work;Leisure;Social Participation;ADL's    Body Structure / Function / Physical Skills IADL;Endurance;Balance;Vision;ADL;Mobility;Strength;UE functional use;FMC;Coordination;Gait;GMC;ROM;Decreased knowledge of use of DME;Dexterity    Cognitive Skills Memory;Problem Solve;Perception;Thought;Safety Awareness    Comorbidities impacting occupational performance description: glioblastoma - pt has begun radiation    OT Frequency 2x / week    OT Duration 12 weeks    OT Treatment/Interventions Self-care/ADL training;Functional Mobility Training;Therapeutic activities;Coping strategies training;Visual/perceptual remediation/compensation;Patient/family education;Cognitive remediation/compensation;Therapeutic exercise;Balance training;Manual  Therapy;Neuromuscular education;Aquatic Therapy;Energy conservation;DME and/or AE instruction;Paraffin;Fluidtherapy;Gait Training;Passive range of motion;Moist Heat    Plan continue tabletop and environmental scanning    Consulted and Agree with Plan of Care Patient;Family member/caregiver    Family Member Consulted wife           Patient will benefit from skilled therapeutic intervention in order to improve the following deficits and impairments:   Body Structure / Function / Physical Skills: IADL, Endurance, Balance, Vision, ADL, Mobility, Strength, UE functional use, FMC, Coordination, Gait, GMC, ROM, Decreased knowledge of use of DME, Dexterity Cognitive Skills: Memory, Problem Solve, Perception, Thought, Safety Awareness     Visit Diagnosis: Unsteadiness on feet  Visuospatial deficit    Problem List Patient Active Problem List   Diagnosis Date Noted  . Glioblastoma multiforme ([place]) 10/11/2019  . Dyslipidemia   . Seizure prophylaxis   . Chronic diastolic congestive heart failure ([place])   . Diabetic peripheral neuropathy ([place])   . Palliative care by specialist   . Weakness 10/08/2019  . Vasogenic edema ([place]) 10/08/2019  . Gross hematuria 10/08/2019  . DNR (do not resuscitate) discussion 07/13/2019  . S/P craniotomy 07/04/2019  . Glioblastoma with isocitrate dehydrogenase gene wildtype ([place]) 07/04/2019  . Respiratory failure ([place])   . Encephalopathy acute   . Brain mass 07/01/2019  . Fever 07/01/2019  . Atherosclerosis of native artery of both lower extremities with intermittent claudication ([place]) 08/23/2018  . Coronary artery disease without angina pectoris 08/23/2018  . Dyspnea on exertion 08/23/2018  . Biochemically recurrent malignant neoplasm of prostate 08/16/2018  . S/P carotid endarterectomy 08/20/2015  . Essential hypertension 08/20/2015  . Type 2 diabetes mellitus with circulatory disorder ([place]) 08/20/2015  . HLD (hyperlipidemia)  08/20/2015  . Carotid  artery stenosis, symptomatic 07/03/2015  . Carotid stenosis, right   . TIA (transient ischemic attack) 06/18/2015  . ASCVD (arteriosclerotic cardiovascular disease) 06/18/2015  . Diabetes mellitus without complication ([place]) 84/21/0312  . Resistant hypertension 06/18/2015  . Hyperlipidemia 06/18/2015  . Spinal stenosis of lumbar region with neurogenic claudication 09/06/2014  . Atherosclerosis of native artery of extremity with intermittent claudication ([place]) 03/09/2013  . HYPERLIPIDEMIA 04/08/2009  . TOBACCO ABUSE 04/08/2009  . ATHEROSCLEROTIC CARDIOVASCULAR DISEASE 04/08/2009  . INTERMITTENT VERTIGO 04/08/2009  . ADENOCARCINOMA, PROSTATE 03/21/2009  . ABDOMINAL PAIN -GENERALIZED 03/21/2009  . History of coronary artery bypass graft 06/28/2008    Carey Bullocks, OTR/L 12/12/2019, 2:21 PM  [facility] [address] [place], [place], [address] Phone: [phone]   Fax:  [phone]  Name: [name] MRN: [id number] Date of Birth: 1946/01/03

## 2019-12-14 ENCOUNTER — Other Ambulatory Visit: Payer: Self-pay

## 2019-12-14 ENCOUNTER — Other Ambulatory Visit: Payer: Self-pay | Admitting: Radiation Therapy

## 2019-12-14 ENCOUNTER — Ambulatory Visit: Payer: Medicare Other

## 2019-12-14 ENCOUNTER — Ambulatory Visit: Payer: Medicare Other | Admitting: Occupational Therapy

## 2019-12-14 DIAGNOSIS — R2681 Unsteadiness on feet: Secondary | ICD-10-CM

## 2019-12-14 DIAGNOSIS — R278 Other lack of coordination: Secondary | ICD-10-CM

## 2019-12-14 DIAGNOSIS — R41842 Visuospatial deficit: Secondary | ICD-10-CM

## 2019-12-14 DIAGNOSIS — M6281 Muscle weakness (generalized): Secondary | ICD-10-CM

## 2019-12-14 DIAGNOSIS — R42 Dizziness and giddiness: Secondary | ICD-10-CM

## 2019-12-14 DIAGNOSIS — C719 Malignant neoplasm of brain, unspecified: Secondary | ICD-10-CM

## 2019-12-14 DIAGNOSIS — R41840 Attention and concentration deficit: Secondary | ICD-10-CM

## 2019-12-14 DIAGNOSIS — R2689 Other abnormalities of gait and mobility: Secondary | ICD-10-CM

## 2019-12-14 DIAGNOSIS — H53462 Homonymous bilateral field defects, left side: Secondary | ICD-10-CM

## 2019-12-14 DIAGNOSIS — R41844 Frontal lobe and executive function deficit: Secondary | ICD-10-CM | POA: Diagnosis not present

## 2019-12-14 NOTE — Therapy (Signed)
[facility] [address] [place], [place], [address] Phone: [phone]   Fax:  [phone]  Physical Therapy Treatment  Patient Details  Name: [name] MRN: [id number] Date of Birth: 1945-11-24 Referring Provider (PT): [name]   Encounter Date: 12/14/2019   PT End of Session - 12/14/19 1204    Visit Number 13    Number of Visits 17    Date for PT Re-Evaluation 12/19/19    Authorization Type 10 V PN on 11/29/19    Progress Note Due on Visit 20    PT Start Time 1155    PT Stop Time 1235    PT Time Calculation (min) 40 min    Equipment Utilized During Treatment Gait belt    Activity Tolerance Patient tolerated treatment well;Patient limited by fatigue    Behavior During Therapy [facility] for tasks assessed/performed           Past Medical History:  Diagnosis Date  . Anxiety   . Arthritis   . ASCVD (arteriosclerotic cardiovascular disease)    03/2004: DES x2 to the RCA; IMI with stent occlusion in 02/2005- suboptimal Plavix compliance  . CAD (coronary artery disease)   . Carcinoma of prostate ([place])    Clopidogrel held for biopsy  . Constipation due to pain medication   . Diabetes mellitus without complication ([place])   . Diabetic neuropathy ([place])   . Headache 2021  . Hyperlipidemia   . Hypertension   . Mild depression ([place])   . Morbid obesity ([place])   . Myocardial infarction ([place])   . Shortness of breath dyspnea   . Sleep apnea    no longer uses cpap  . Stroke ([place]) 2017  . Tobacco abuse    stopped smoking 06/28/09    Past Surgical History:  Procedure Laterality Date  . APPLICATION OF CRANIAL NAVIGATION N/A 07/04/2019   Procedure: APPLICATION OF CRANIAL NAVIGATION;  Surgeon: Ashok Pall, MD;  Location: [facility];  Service: Neurosurgery;  Laterality: N/A;  . BACK SURGERY    . CARDIAC CATHETERIZATION     X 1 stent before having CABG  . CORONARY ARTERY BYPASS GRAFT  06/28/2008   X4  . CRANIOTOMY  Right 07/04/2019   Procedure: Right occipital craniotomy for tumor with brainlab;  Surgeon: Ashok Pall, MD;  Location: [facility] [place];  Service: Neurosurgery;  Laterality: Right;  . ENDARTERECTOMY Right 07/03/2015   Procedure: RIGHT CAROTID ENDARTERECTOMY WITH BOVINE PERICARDIUM PATCH ANGIOPLASTY;  Surgeon: Serafina Mitchell, MD;  Location: [facility];  Service: Vascular;  Laterality: Right;  . LUMBAR LAMINECTOMY/DECOMPRESSION MICRODISCECTOMY N/A 09/06/2014   Procedure: LUMBER DECOMPRESSION L3-5;  Surgeon: Melina Schools, MD;  Location: [facility];  Service: Orthopedics;  Laterality: N/A;  . LUMBAR SPINE SURGERY  2002  . PROSTATE BIOPSY    . PROSTATECTOMY  02/2007  . VASECTOMY      There were no vitals filed for this visit.   Subjective Assessment - 12/14/19 1202    Subjective I spoke to salesperson about stair lift.    Patient is accompained by: Family member    Pertinent History PMH: Rt occipital craniotomy 07/04/19 secondary to glioblastoma. PMH: Prostate CA, CVA 2017 (no residual deficits), CAD, DM, HTN, hx of 2 lumbar surgeries 2002, 2016 (laminectomies)    Limitations Standing;Walking    How long can you sit comfortably? no issues    How long can you stand comfortably? 5 min    How long can you walk comfortably? 5 min  Patient Stated Goals walk better                  Sit to stand from standard chair height + 2 airex cushions: 10x, pt required multiple tris during 2 repetisions. Sit to stand from standard chair height + 1 airex cushion: 2 x 5 with use of L UE only Heel raises: 2 x 10  Walking fwd and bwd: Pt holds PT's hands in front lightly: 3 x 10 steps Lateral walking: Pt holds PT's hands in front lightly: 3 x 10 steps. Gait training: using a rollator : 207' with cues to stand up straight, relax shoulders.                      PT Short Term Goals - 12/12/19 1350      PT SHORT TERM GOAL #1   Title Pt will be able to perform 5x sit to stand under 30 seconds with one  UE to improve functional strength from standard chair.    Baseline Pt unable to perform them (10/24/19); unable to without UE support (11/24/19); attempted with one UE but pt only able to complete 1 rep with multiple tries; pt was able to stand up using one UE on chair and second hand on walker (12/12/19)    Time 4    Period Weeks    Status On-going    Target Date 12/22/19      PT SHORT TERM GOAL #2   Title Patient will be able to ambulate for 10 min without rest with use of his walker to improve walking endurnace    Baseline <5 min (10/24/19); 5-10 min (11/29/19)    Time 4    Period Weeks    Status Achieved    Target Date 11/21/19      PT SHORT TERM GOAL #3   Title Pt will demo 4 points improvement on Berg balance scale to improve overall static balance    Baseline 37/56 (10/24/19); 8/6 42/56    Time 4    Period Weeks    Status Achieved    Target Date 11/21/19             PT Long Term Goals - 11/29/19 1353      PT LONG TERM GOAL #1   Title Pt will demo 47/56 or better on BBS to improve balance and reduce fall risk    Baseline 37/26 (10/24/19);11/24/19 42/56    Time 8    Period Weeks    Status On-going      PT LONG TERM GOAL #2   Title Patient will be able to perform sit to stand in under 25 seconds to improve overall strength    Baseline unable (10/24/19); needs to use bil UE to complete sit to stand (33 sec)    Time 8    Period Weeks    Status New      PT LONG TERM GOAL #3   Title Pt will be able to ambulate 100' without AD to improve household ambulation without AD    Baseline Pt uses cane in house (Eval); 115' with CGA without AD    Time 8    Period Weeks    Status Achieved      PT LONG TERM GOAL #4   Title Pt will demo improvement by 0.15 m/s with use of st. cane to improve functional gait velocity    Baseline 0.81m/s with cane, 0.52m/s with rolling walker (eval)  Time 8    Period Weeks    Status New                 Plan - 12/14/19 1203    Clinical  Impression Statement Pt is demonstrating improving activity tolerance in therapy with functional activities and exercises. Patient still requires education to continue with increasing his walking endurance with wife's supervision.    Personal Factors and Comorbidities Comorbidity 3+    Comorbidities craniotomy secondary to glioblastoma, hx of lumbar surgeries x 2, CABG x 4    Examination-Activity Limitations Squat;Stairs;Locomotion Level;Caring for Others;Carry    Examination-Participation Restrictions Cleaning;Community Activity;Driving;Laundry;Yard Work;Shop    Stability/Clinical Decision Making Unstable/Unpredictable    Rehab Potential Good    PT Frequency 2x / week    PT Duration 8 weeks    PT Treatment/Interventions ADLs/Self Care Home Management;Gait training;Stair training;Functional mobility training;Therapeutic activities;Therapeutic exercise;Balance training;Neuromuscular re-education;Vestibular;Visual/perceptual remediation/compensation    PT Next Visit Plan Gait trianing with rollator,    PT Home Exercise Plan SLS, sit to stand without HHA, Access Code: [email or web address]: [email or web address]: 06/03/2021Prepared by: Gwenyth Bouillon PatelExercisesStanding Balance in Corner with Eyes Closed - 1 x daily - 7 x weekly - 2 sets - 10 reps           Patient will benefit from skilled therapeutic intervention in order to improve the following deficits and impairments:  Abnormal gait, Decreased activity tolerance, Decreased balance, Decreased mobility, Decreased endurance, Difficulty walking, Dizziness, Impaired vision/preception  Visit Diagnosis: Unsteadiness on feet  Muscle weakness (generalized)  Other abnormalities of gait and mobility  Dizziness and giddiness  Glioblastoma multiforme (HCC)     Problem List Patient Active Problem List   Diagnosis Date Noted  . 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 Dyslipidemia   . Seizure prophylaxis   . Chronic diastolic  congestive heart failure ([place])   . Diabetic peripheral neuropathy ([place])   . Palliative care by specialist   . Weakness 10/08/2019  . Vasogenic edema ([place]) 10/08/2019  . Gross hematuria 10/08/2019  . DNR (do not resuscitate) discussion 07/13/2019  . S/P craniotomy 07/04/2019  . 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 Respiratory failure ([place])   . Encephalopathy acute   . Brain mass 07/01/2019  . Fever 07/01/2019  . Atherosclerosis of native artery of both lower extremities with intermittent claudication ([place]) 08/23/2018  . Coronary artery disease without angina pectoris 08/23/2018  . Dyspnea on exertion 08/23/2018  . Biochemically recurrent malignant neoplasm of prostate 08/16/2018  . S/P carotid endarterectomy 08/20/2015  . Essential hypertension 08/20/2015  . 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 HLD (hyperlipidemia) 08/20/2015  . Carotid artery stenosis, symptomatic 07/03/2015  . Carotid stenosis, right   . TIA (transient ischemic attack) 06/18/2015  . ASCVD (arteriosclerotic cardiovascular disease) 06/18/2015  . Diabetes mellitus without complication ([place]) 07/86/7544  . Resistant hypertension 06/18/2015  . Hyperlipidemia 06/18/2015  . Spinal stenosis of lumbar region with neurogenic claudication 09/06/2014  . Atherosclerosis of native artery of extremity with intermittent claudication ([place]) 03/09/2013  . HYPERLIPIDEMIA 04/08/2009  . TOBACCO ABUSE 04/08/2009  . ATHEROSCLEROTIC CARDIOVASCULAR DISEASE 04/08/2009  . INTERMITTENT VERTIGO 04/08/2009  . ADENOCARCINOMA, PROSTATE 03/21/2009  . ABDOMINAL PAIN -GENERALIZED 03/21/2009  . History of coronary artery bypass graft 06/28/2008    [name] 12/14/2019, 12:41 PM  [facility] [address] [place], [place], [address] Phone: [phone]   Fax:  [phone]  Name: [name] MRN:  [id number]  Date of Birth: 01-12-1946

## 2019-12-14 NOTE — Therapy (Signed)
[facility] [address] [place], [place], [address] Phone: [phone]   Fax:  [phone]  Occupational Therapy Treatment  Patient Details  Name: [name] MRN: [id number] Date of Birth: 1946-01-30 Referring Provider (OT): Dr. Bertis Ruddy   Encounter Date: 12/14/2019   OT End of Session - 12/14/19 1250    Visit Number 13    Number of Visits 25    Date for OT Re-Evaluation 01/23/20    Authorization Type MCR primary, AARP secondary    Authorization Time Period 90 days, may d/c after 8 weeks dep on progress.    Authorization - Visit Number 13    Authorization - Number of Visits 20    Progress Note Due on Visit 20    OT Start Time 6962    OT Stop Time 1315    OT Time Calculation (min) 40 min    Activity Tolerance Patient tolerated treatment well    Behavior During Therapy [facility] for tasks assessed/performed           Past Medical History:  Diagnosis Date  . Anxiety   . Arthritis   . ASCVD (arteriosclerotic cardiovascular disease)    03/2004: DES x2 to the RCA; IMI with stent occlusion in 02/2005- suboptimal Plavix compliance  . CAD (coronary artery disease)   . Carcinoma of prostate ([place])    Clopidogrel held for biopsy  . Constipation due to pain medication   . Diabetes mellitus without complication ([place])   . Diabetic neuropathy ([place])   . Headache 2021  . Hyperlipidemia   . Hypertension   . Mild depression ([place])   . Morbid obesity ([place])   . Myocardial infarction ([place])   . Shortness of breath dyspnea   . Sleep apnea    no longer uses cpap  . Stroke ([place]) 2017  . Tobacco abuse    stopped smoking 06/28/09    Past Surgical History:  Procedure Laterality Date  . APPLICATION OF CRANIAL NAVIGATION N/A 07/04/2019   Procedure: APPLICATION OF CRANIAL NAVIGATION;  Surgeon: Ashok Pall, MD;  Location: [facility];  Service: Neurosurgery;  Laterality: N/A;  . BACK SURGERY    . CARDIAC CATHETERIZATION      X 1 stent before having CABG  . CORONARY ARTERY BYPASS GRAFT  06/28/2008   X4  . CRANIOTOMY Right 07/04/2019   Procedure: Right occipital craniotomy for tumor with brainlab;  Surgeon: Ashok Pall, MD;  Location: [facility];  Service: Neurosurgery;  Laterality: Right;  . ENDARTERECTOMY Right 07/03/2015   Procedure: RIGHT CAROTID ENDARTERECTOMY WITH BOVINE PERICARDIUM PATCH ANGIOPLASTY;  Surgeon: Serafina Mitchell, MD;  Location: [facility];  Service: Vascular;  Laterality: Right;  . LUMBAR LAMINECTOMY/DECOMPRESSION MICRODISCECTOMY N/A 09/06/2014   Procedure: LUMBER DECOMPRESSION L3-5;  Surgeon: Melina Schools, MD;  Location: [facility];  Service: Orthopedics;  Laterality: N/A;  . LUMBAR SPINE SURGERY  2002  . PROSTATE BIOPSY    . PROSTATECTOMY  02/2007  . VASECTOMY      There were no vitals filed for this visit.   Subjective Assessment - 12/14/19 1240    Subjective  He finished up the chemo for this round - it's 23 days off    Patient is accompanied by: Family member   wife   Pertinent History [name] is a 74 y.o. 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, prostate cancer with  radiation therapy prostatectomy, CAD with CABG maintained on Xarelto, chronic diastolic congestive heart failure.  Lives with spouse two-level home he had been receiving outpatient therapies.  Presented 10/08/2019 with increasing left-sided weakness headache nausea vomiting as well as bouts of hematuria.   CT of the head showed new severe vasogenic edema throughout the posterior right cerebral hemisphere with 8 mm leftNo evidence of acute intracranial abnormality.  Follow-up medical oncology maintained on Decadron therapy.    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 No/denies           Pt sorted cards into suits w/ extra time. Pt then placed cards into numerical order with min cues due to visual memory and cognitive deficits. Then pt had to id missing cards and place in order with extra time and mod cueing required. Pt id shapes with Perfection with cues given to differentiate between similar shapes, look to the edge of the game, and to orient piece correctly.                        OT Short Term Goals - 11/24/19 1043      OT SHORT TERM GOAL #1   Title Pt to verbalize understanding of visual compensatory strategies to improve daily function including reading and environmental scanning    Time 4    Period Weeks    Status Achieved      OT SHORT TERM GOAL #2   Title Pt to perform tabletop scanning with 90% or greater accuracy using strategies    Baseline 85% for 1.5 M number cancellation    Time 4    Period Weeks    Status On-going   28/80 missed     OT SHORT TERM GOAL #3   Title Pt will read 3/4 page of text with accuracy, visual aids/AE and extra time PRN    Time 4    Period Weeks    Status Achieved   met with larger print double spaced and line guide     OT SHORT TERM GOAL #4   Title Pt will perform dressing with supervision/ set up    Baseline min A for shoes/ socks    Time 4    Period Weeks    Status On-going   ongoing, not perfroming at home     OT SHORT TERM GOAL #5   Title Pt will demonstrate improved LUE fine motor coordination for ADLs as evidenced by decreasing 9 hole peg test score to 52 secs or less.    Baseline RUE 33 secs, LUE 58.25    Time 4    Period Weeks    Status On-going   11/21/19:  68.41sec     OT SHORT TERM GOAL #6   Title Pt will demonstrate ability to retrieve a llightweight object at 130 shoulder flexion with LUE demonstrating good control.    Baseline shoulder flexion RUE 140, LUE 120    Time 4    Period Weeks    Status Achieved   11/21/19:  130*            OT Long  Term Goals - 10/25/19 0747      OT LONG TERM GOAL #1   Title Pt will perform environmental scanning at 90% accuracy in mod distracting environment    Time 12    Period  Weeks    Status New    Target Date 01/23/20      OT LONG TERM GOAL #2   Title Pt to read full page of text or greater with visual aids/AE prn    Time 12    Period Weeks    Status New      OT LONG TERM GOAL #3   Title Pt will demonstrate improved fine motor coordination in LUE for ADLS as evidenced by decreasing 9 hole peg test score to 48 secs or less.    Baseline Pt will perform all basic ADLS with distant supervision.    Time 12    Period Weeks    Status New      OT LONG TERM GOAL #4   Title Pt will perform basic home management tasks with supervision demonstrating good safety awareness.    Time 12    Period Weeks    Status New                 Plan - 12/14/19 1355    Clinical Impression Statement Pt spontaneously looking to Lt side with less cueing. Pt appears to have increased balance since coming off chemo    Occupational performance deficits (Please refer to evaluation for details): IADL's;Work;Leisure;Social Participation;ADL's    Body Structure / Function / Physical Skills IADL;Endurance;Balance;Vision;ADL;Mobility;Strength;UE functional use;FMC;Coordination;Gait;GMC;ROM;Decreased knowledge of use of DME;Dexterity    Cognitive Skills Memory;Problem Solve;Perception;Thought;Safety Awareness    Comorbidities impacting occupational performance description: glioblastoma - pt has begun radiation    OT Frequency 2x / week    OT Duration 12 weeks    OT Treatment/Interventions Self-care/ADL training;Functional Mobility Training;Therapeutic activities;Coping strategies training;Visual/perceptual remediation/compensation;Patient/family education;Cognitive remediation/compensation;Therapeutic exercise;Balance training;Manual Therapy;Neuromuscular education;Aquatic Therapy;Energy conservation;DME and/or AE  instruction;Paraffin;Fluidtherapy;Gait Training;Passive range of motion;Moist Heat    Plan walker safety/negotiation in tight spaces, matching analogue and digital times    Consulted and Agree with Plan of Care Patient;Family member/caregiver    Family Member Consulted wife           Patient will benefit from skilled therapeutic intervention in order to improve the following deficits and impairments:   Body Structure / Function / Physical Skills: IADL, Endurance, Balance, Vision, ADL, Mobility, Strength, UE functional use, FMC, Coordination, Gait, GMC, ROM, Decreased knowledge of use of DME, Dexterity Cognitive Skills: Memory, Problem Solve, Perception, Thought, Safety Awareness     Visit Diagnosis: Visuospatial deficit  Homonymous hemianopsia, left  Unsteadiness on feet  Attention and concentration deficit  Other lack of coordination    Problem List Patient Active Problem List   Diagnosis Date Noted  . Glioblastoma multiforme ([place]) 10/11/2019  . Dyslipidemia   . Seizure prophylaxis   . Chronic diastolic congestive heart failure ([place])   . Diabetic peripheral neuropathy ([place])   . Palliative care by specialist   . Weakness 10/08/2019  . Vasogenic edema ([place]) 10/08/2019  . Gross hematuria 10/08/2019  . DNR (do not resuscitate) discussion 07/13/2019  . S/P craniotomy 07/04/2019  . Glioblastoma with isocitrate dehydrogenase gene wildtype ([place]) 07/04/2019  . Respiratory failure ([place])   . Encephalopathy acute   . Brain mass 07/01/2019  . Fever 07/01/2019  . Atherosclerosis of native artery of both lower extremities with intermittent claudication ([place]) 08/23/2018  . Coronary artery disease without angina pectoris 08/23/2018  . Dyspnea on exertion 08/23/2018  . Biochemically recurrent malignant neoplasm of prostate 08/16/2018  . S/P carotid endarterectomy 08/20/2015  . Essential hypertension 08/20/2015  . Type 2 diabetes mellitus with circulatory disorder ([place]) 08/20/2015    .  HLD (hyperlipidemia) 08/20/2015  . Carotid artery stenosis, symptomatic 07/03/2015  . Carotid stenosis, right   . TIA (transient ischemic attack) 06/18/2015  . ASCVD (arteriosclerotic cardiovascular disease) 06/18/2015  . Diabetes mellitus without complication ([place]) 93/96/8864  . Resistant hypertension 06/18/2015  . Hyperlipidemia 06/18/2015  . Spinal stenosis of lumbar region with neurogenic claudication 09/06/2014  . Atherosclerosis of native artery of extremity with intermittent claudication ([place]) 03/09/2013  . HYPERLIPIDEMIA 04/08/2009  . TOBACCO ABUSE 04/08/2009  . ATHEROSCLEROTIC CARDIOVASCULAR DISEASE 04/08/2009  . INTERMITTENT VERTIGO 04/08/2009  . ADENOCARCINOMA, PROSTATE 03/21/2009  . ABDOMINAL PAIN -GENERALIZED 03/21/2009  . History of coronary artery bypass graft 06/28/2008    Carey Bullocks, OTR/L 12/14/2019, 2:01 PM  [facility] [address] [place], [place], [address] Phone: [phone]   Fax:  [phone]  Name: KYLEE NARDOZZI MRN: [id number] Date of Birth: Mar 22, 1946

## 2019-12-19 ENCOUNTER — Other Ambulatory Visit: Payer: Self-pay

## 2019-12-19 ENCOUNTER — Ambulatory Visit: Payer: Medicare Other | Admitting: Occupational Therapy

## 2019-12-19 ENCOUNTER — Encounter: Payer: Self-pay | Admitting: Occupational Therapy

## 2019-12-19 ENCOUNTER — Ambulatory Visit: Payer: Medicare Other

## 2019-12-19 DIAGNOSIS — R41844 Frontal lobe and executive function deficit: Secondary | ICD-10-CM | POA: Diagnosis not present

## 2019-12-19 DIAGNOSIS — R2681 Unsteadiness on feet: Secondary | ICD-10-CM

## 2019-12-19 DIAGNOSIS — R278 Other lack of coordination: Secondary | ICD-10-CM | POA: Diagnosis not present

## 2019-12-19 DIAGNOSIS — H53462 Homonymous bilateral field defects, left side: Secondary | ICD-10-CM

## 2019-12-19 DIAGNOSIS — M6281 Muscle weakness (generalized): Secondary | ICD-10-CM | POA: Diagnosis not present

## 2019-12-19 DIAGNOSIS — R41840 Attention and concentration deficit: Secondary | ICD-10-CM | POA: Diagnosis not present

## 2019-12-19 DIAGNOSIS — R41842 Visuospatial deficit: Secondary | ICD-10-CM | POA: Diagnosis not present

## 2019-12-19 DIAGNOSIS — C719 Malignant neoplasm of brain, unspecified: Secondary | ICD-10-CM

## 2019-12-19 DIAGNOSIS — R2689 Other abnormalities of gait and mobility: Secondary | ICD-10-CM

## 2019-12-19 DIAGNOSIS — R42 Dizziness and giddiness: Secondary | ICD-10-CM

## 2019-12-19 NOTE — Therapy (Signed)
[facility] [address] [place], [place], [address] Phone: [phone]   Fax:  [phone]  Occupational Therapy Treatment  Patient Details  Name: [name] MRN: [id number] Date of Birth: 1945/06/14 Referring Provider (OT): Dr. Bertis Ruddy   Encounter Date: 12/19/2019   OT End of Session - 12/19/19 1237    Visit Number 14    Number of Visits 25    Date for OT Re-Evaluation 01/23/20    Authorization Type MCR primary, AARP secondary    Authorization Time Period 90 days, may d/c after 8 weeks dep on progress.    Authorization - Visit Number 14    Authorization - Number of Visits 20    Progress Note Due on Visit 20    OT Start Time 1233    OT Stop Time 1315    OT Time Calculation (min) 42 min    Activity Tolerance Patient tolerated treatment well    Behavior During Therapy WFL for tasks assessed/performed           Past Medical History:  Diagnosis Date  . Anxiety   . Arthritis   . ASCVD (arteriosclerotic cardiovascular disease)    03/2004: DES x2 to the RCA; IMI with stent occlusion in 02/2005- suboptimal Plavix compliance  . CAD (coronary artery disease)   . Carcinoma of prostate ([place])    Clopidogrel held for biopsy  . Constipation due to pain medication   . Diabetes mellitus without complication ([place])   . Diabetic neuropathy ([place])   . Headache 2021  . Hyperlipidemia   . Hypertension   . Mild depression ([place])   . Morbid obesity ([place])   . Myocardial infarction ([place])   . Shortness of breath dyspnea   . Sleep apnea    no longer uses cpap  . Stroke ([place]) 2017  . Tobacco abuse    stopped smoking 06/28/09    Past Surgical History:  Procedure Laterality Date  . APPLICATION OF CRANIAL NAVIGATION N/A 07/04/2019   Procedure: APPLICATION OF CRANIAL NAVIGATION;  Surgeon: Ashok Pall, MD;  Location: [facility];  Service: Neurosurgery;  Laterality: N/A;  . BACK SURGERY    . CARDIAC CATHETERIZATION      X 1 stent before having CABG  . CORONARY ARTERY BYPASS GRAFT  06/28/2008   X4  . CRANIOTOMY Right 07/04/2019   Procedure: Right occipital craniotomy for tumor with brainlab;  Surgeon: Ashok Pall, MD;  Location: [facility];  Service: Neurosurgery;  Laterality: Right;  . ENDARTERECTOMY Right 07/03/2015   Procedure: RIGHT CAROTID ENDARTERECTOMY WITH BOVINE PERICARDIUM PATCH ANGIOPLASTY;  Surgeon: Serafina Mitchell, MD;  Location: [facility];  Service: Vascular;  Laterality: Right;  . LUMBAR LAMINECTOMY/DECOMPRESSION MICRODISCECTOMY N/A 09/06/2014   Procedure: LUMBER DECOMPRESSION L3-5;  Surgeon: Melina Schools, MD;  Location: [facility];  Service: Orthopedics;  Laterality: N/A;  . LUMBAR SPINE SURGERY  2002  . PROSTATE BIOPSY    . PROSTATECTOMY  02/2007  . VASECTOMY      There were no vitals filed for this visit.   Subjective Assessment - 12/19/19 1232    Subjective  The whole weekend was good!    Patient is accompanied by: Family member   wife   Pertinent History [name] is a 74 y.o. 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, prostate cancer with radiation therapy prostatectomy, CAD with CABG maintained on  Xarelto, chronic diastolic congestive heart failure.  Lives with spouse two-level home he had been receiving outpatient therapies.  Presented 10/08/2019 with increasing left-sided weakness headache nausea vomiting as well as bouts of hematuria.   CT of the head showed new severe vasogenic edema throughout the posterior right cerebral hemisphere with 8 mm leftNo evidence of acute intracranial abnormality.  Follow-up medical oncology maintained on Decadron therapy.    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 No/denies                        Treatment: Patient seen for therapy session in therapy gym. Table top activities this day to target visual scanning, and LUE coordination and functional use. Patient engaged in time activity with matching digital and analog clocks while scanning. Patient required moderate cues for strategy of starting all the way to the left and scanning across. Patient completed 6 matches in approximately 30 minutes. Patient completed PVC pipe tree pattern with max verbal and visual cues for recall of strategy for organizing materials prior and step by step for assembly and min verbal cues for scanning to left.            OT Short Term Goals - 11/24/19 1043      OT SHORT TERM GOAL #1   Title Pt to verbalize understanding of visual compensatory strategies to improve daily function including reading and environmental scanning    Time 4    Period Weeks    Status Achieved      OT SHORT TERM GOAL #2   Title Pt to perform tabletop scanning with 90% or greater accuracy using strategies    Baseline 85% for 1.5 M number cancellation    Time 4    Period Weeks    Status On-going   28/80 missed     OT SHORT TERM GOAL #3   Title Pt will read 3/4 page of text with accuracy, visual aids/AE and extra time PRN    Time 4    Period Weeks    Status Achieved   met with larger print double spaced and line guide     OT SHORT TERM GOAL #4   Title Pt will perform dressing with supervision/ set up    Baseline min A for shoes/ socks    Time 4    Period Weeks    Status On-going   ongoing, not perfroming at home     OT SHORT TERM GOAL #5   Title Pt will demonstrate improved LUE fine motor coordination for ADLs as evidenced by decreasing 9 hole peg test score to 52 secs or less.    Baseline RUE 33 secs, LUE 58.25    Time 4    Period Weeks    Status On-going   11/21/19:  68.41sec     OT SHORT TERM GOAL #6   Title Pt will demonstrate ability to retrieve a llightweight object at 130 shoulder  flexion with LUE demonstrating good control.    Baseline shoulder flexion RUE 140, LUE 120    Time 4    Period Weeks    Status Achieved   11/21/19:  130*            OT Long Term Goals - 10/25/19 0747      OT LONG TERM GOAL #1   Title Pt  will perform environmental scanning at 90% accuracy in mod distracting environment    Time 12    Period Weeks    Status New    Target Date 01/23/20      OT LONG TERM GOAL #2   Title Pt to read full page of text or greater with visual aids/AE prn    Time 12    Period Weeks    Status New      OT LONG TERM GOAL #3   Title Pt will demonstrate improved fine motor coordination in LUE for ADLS as evidenced by decreasing 9 hole peg test score to 48 secs or less.    Baseline Pt will perform all basic ADLS with distant supervision.    Time 12    Period Weeks    Status New      OT LONG TERM GOAL #4   Title Pt will perform basic home management tasks with supervision demonstrating good safety awareness.    Time 12    Period Weeks    Status New                 Plan - 12/19/19 1317    Clinical Impression Statement Patient demonstrated increased difficulties with scanning to the left and recalling strategies.    Occupational performance deficits (Please refer to evaluation for details): IADL's;Work;Leisure;Social Participation;ADL's    Body Structure / Function / Physical Skills IADL;Endurance;Balance;Vision;ADL;Mobility;Strength;UE functional use;FMC;Coordination;Gait;GMC;ROM;Decreased knowledge of use of DME;Dexterity    Cognitive Skills Memory;Problem Solve;Perception;Thought;Safety Awareness    Comorbidities impacting occupational performance description: glioblastoma - pt has begun radiation    OT Frequency 2x / week    OT Duration 12 weeks    OT Treatment/Interventions Self-care/ADL training;Functional Mobility Training;Therapeutic activities;Coping strategies training;Visual/perceptual remediation/compensation;Patient/family  education;Cognitive remediation/compensation;Therapeutic exercise;Balance training;Manual Therapy;Neuromuscular education;Aquatic Therapy;Energy conservation;DME and/or AE instruction;Paraffin;Fluidtherapy;Gait Training;Passive range of motion;Moist Heat    Plan walker safety/negotiation in tight spaces    Consulted and Agree with Plan of Care Patient;Family member/caregiver    Family Member Consulted wife           Patient will benefit from skilled therapeutic intervention in order to improve the following deficits and impairments:   Body Structure / Function / Physical Skills: IADL, Endurance, Balance, Vision, ADL, Mobility, Strength, UE functional use, FMC, Coordination, Gait, GMC, ROM, Decreased knowledge of use of DME, Dexterity Cognitive Skills: Memory, Problem Solve, Perception, Thought, Safety Awareness     Visit Diagnosis: Homonymous hemianopsia, left  Unsteadiness on feet  Muscle weakness (generalized)  Other abnormalities of gait and mobility  Visuospatial deficit  Attention and concentration deficit    Problem List Patient Active Problem List   Diagnosis Date Noted  . Glioblastoma multiforme ([facility]) 10/11/2019  . Dyslipidemia   . Seizure prophylaxis   . Chronic diastolic congestive heart failure ([place])   . Diabetic peripheral neuropathy ([place])   . Palliative care by specialist   . Weakness 10/08/2019  . Vasogenic edema ([place]) 10/08/2019  . Gross hematuria 10/08/2019  . DNR (do not resuscitate) discussion 07/13/2019  . S/P craniotomy 07/04/2019  . Glioblastoma with isocitrate dehydrogenase gene wildtype ([place]) 07/04/2019  . Respiratory failure ([place])   . Encephalopathy acute   . Brain mass 07/01/2019  . Fever 07/01/2019  . Atherosclerosis of native artery of both lower extremities with intermittent claudication ([place]) 08/23/2018  . Coronary artery disease without angina pectoris 08/23/2018  . Dyspnea on exertion 08/23/2018  . Biochemically recurrent malignant  neoplasm of prostate 08/16/2018  . S/P carotid endarterectomy 08/20/2015  .  Essential hypertension 08/20/2015  . Type 2 diabetes mellitus with circulatory disorder ([place]) 08/20/2015  . HLD (hyperlipidemia) 08/20/2015  . Carotid artery stenosis, symptomatic 07/03/2015  . Carotid stenosis, right   . TIA (transient ischemic attack) 06/18/2015  . ASCVD (arteriosclerotic cardiovascular disease) 06/18/2015  . Diabetes mellitus without complication ([place]) 88/82/8003  . Resistant hypertension 06/18/2015  . Hyperlipidemia 06/18/2015  . Spinal stenosis of lumbar region with neurogenic claudication 09/06/2014  . Atherosclerosis of native artery of extremity with intermittent claudication ([place]) 03/09/2013  . HYPERLIPIDEMIA 04/08/2009  . TOBACCO ABUSE 04/08/2009  . ATHEROSCLEROTIC CARDIOVASCULAR DISEASE 04/08/2009  . INTERMITTENT VERTIGO 04/08/2009  . ADENOCARCINOMA, PROSTATE 03/21/2009  . ABDOMINAL PAIN -GENERALIZED 03/21/2009  . History of coronary artery bypass graft 06/28/2008   Waldo Laine, MOT, OTR/L Occupational Therapist  Zachery Conch 12/19/2019, 2:13 PM  [facility] [address] [place], [place], [address] Phone: [phone]   Fax:  [phone]  Name: TYRIC RODEHEAVER MRN: [id number] Date of Birth: [birth year]

## 2019-12-19 NOTE — Therapy (Signed)
[facility] [address] [place], [place], [address] Phone: [phone]   Fax:  [phone]  Physical Therapy Therapy Recertification  Patient Details  Name: [name] MRN: [id number] Date of Birth: 01-Jan-1946 Referring Provider (PT): [name]   Encounter Date: 12/19/2019   PT End of Session - 12/19/19 1208    Visit Number 14    Number of Visits 26    Date for PT Re-Evaluation 02/13/20    Authorization Type 10 V PN on 6/75/91; recert 6/38/46 (2x/wek for 4 weeks, 1x/week for 4 more weeks - total 8 weeks)    Progress Note Due on Visit 28    PT Start Time 1145    PT Stop Time 1230    PT Time Calculation (min) 45 min    Equipment Utilized During Treatment Gait belt    Activity Tolerance Patient tolerated treatment well;Patient limited by fatigue    Behavior During Therapy WFL for tasks assessed/performed           Past Medical History:  Diagnosis Date  . Anxiety   . Arthritis   . ASCVD (arteriosclerotic cardiovascular disease)    03/2004: DES x2 to the RCA; IMI with stent occlusion in 02/2005- suboptimal Plavix compliance  . CAD (coronary artery disease)   . Carcinoma of prostate ([facility])    Clopidogrel held for biopsy  . Constipation due to pain medication   . Diabetes mellitus without complication ([place])   . Diabetic neuropathy ([place])   . Headache 2021  . Hyperlipidemia   . Hypertension   . Mild depression ([place] [place])   . Morbid obesity ([place])   . Myocardial infarction ([facility])   . Shortness of breath dyspnea   . Sleep apnea    no longer uses cpap  . Stroke ([place]) 2017  . Tobacco abuse    stopped smoking 06/28/09    Past Surgical History:  Procedure Laterality Date  . APPLICATION OF CRANIAL NAVIGATION N/A 07/04/2019   Procedure: APPLICATION OF CRANIAL NAVIGATION;  Surgeon: Ashok Pall, MD;  Location: [facility];  Service: Neurosurgery;  Laterality: N/A;  . BACK SURGERY    . CARDIAC CATHETERIZATION     X 1  stent before having CABG  . CORONARY ARTERY BYPASS GRAFT  06/28/2008   X4  . CRANIOTOMY Right 07/04/2019   Procedure: Right occipital craniotomy for tumor with brainlab;  Surgeon: Ashok Pall, MD;  Location: [facility];  Service: Neurosurgery;  Laterality: Right;  . ENDARTERECTOMY Right 07/03/2015   Procedure: RIGHT CAROTID ENDARTERECTOMY WITH BOVINE PERICARDIUM PATCH ANGIOPLASTY;  Surgeon: Serafina Mitchell, MD;  Location: [facility];  Service: Vascular;  Laterality: Right;  . LUMBAR LAMINECTOMY/DECOMPRESSION MICRODISCECTOMY N/A 09/06/2014   Procedure: LUMBER DECOMPRESSION L3-5;  Surgeon: Melina Schools, MD;  Location: [facility];  Service: Orthopedics;  Laterality: N/A;  . LUMBAR SPINE SURGERY  2002  . PROSTATE BIOPSY    . PROSTATECTOMY  02/2007  . VASECTOMY      There were no vitals filed for this visit.   Subjective Assessment - 12/19/19 1152    Subjective Nothing new to report. He tried walking to garage.    Patient is accompained by: Family member    Pertinent History PMH: Rt occipital craniotomy 07/04/19 secondary to glioblastoma. PMH: Prostate CA, CVA 2017 (no residual deficits), CAD, DM, HTN, hx of 2 lumbar surgeries 2002, 2016 (laminectomies)    Limitations Standing;Walking    How long can you sit comfortably? no issues    How long can  you stand comfortably? 5 min    How long can you walk comfortably? 5 min    Patient Stated Goals walk better              [facility] PT Assessment - 12/19/19 0001      Berg Balance Test   Sit to Stand Able to stand  independently using hands    Standing Unsupported Able to stand safely 2 minutes    Sitting with Back Unsupported but Feet Supported on Floor or Stool Able to sit safely and securely 2 minutes    Stand to Sit Sits safely with minimal use of hands    Transfers Able to transfer safely, minor use of hands    Standing Unsupported with Eyes Closed Able to stand 10 seconds safely    Standing Unsupported with Feet Together Able to place feet together  independently and stand 1 minute safely    From Standing, Reach Forward with Outstretched Arm Can reach confidently >25 cm (10")    From Standing Position, Pick up Object from Floor Able to pick up shoe, needs supervision    From Standing Position, Turn to Look Behind Over each Shoulder Looks behind from both sides and weight shifts well    Turn 360 Degrees Able to turn 360 degrees safely but slowly    Standing Unsupported, Alternately Place Feet on Step/Stool Able to stand independently and complete 8 steps >20 seconds    Standing Unsupported, One Foot in Front Able to take small step independently and hold 30 seconds    Standing on One Leg Able to lift leg independently and hold equal to or more than 3 seconds    Total Score 47                Gait training: 2 x 115' with quad cane  BBS performed today 5x sit to stand:  3reps with R UE on chair and L UE on walker 5 reps with use of BIL UE from standard chair                   PT Short Term Goals - 12/19/19 1240      PT SHORT TERM GOAL #1   Title Pt will be able to perform 5x sit to stand under 30 seconds with bil UE to improve functional strength from standard chair.    Baseline Pt unable to perform them (10/24/19); unable to without UE support (11/24/19); attempted with one UE but pt only able to complete 1 rep with multiple tries; pt was able to stand up using one UE on chair and second hand on walker (12/12/19); pt able to complete 3 reps with R UE on chair and L UE on walker but unable to complete 5 reps (12/19/19); bil UE use 42 seconds    Time 4    Period Weeks    Status Revised    Target Date 01/16/20      PT SHORT TERM GOAL #2   Title Patient will be able to ambulate for 10 min without rest with use of his walker to improve walking endurnace    Baseline <5 min (10/24/19); 5-10 min (11/29/19)    Time 4    Period Weeks    Status Achieved    Target Date 11/21/19      PT SHORT TERM GOAL #3   Title Pt will demo  4 points improvement on Berg balance scale to improve overall static balance    Baseline 37/56 (  10/24/19); 8/6 42/56    Time 4    Period Weeks    Status Achieved    Target Date 11/21/19             PT Long Term Goals - 12/19/19 1246      PT LONG TERM GOAL #1   Title Pt will demo 47/56 or better on BBS to improve balance and reduce fall risk    Baseline 37/26 (10/24/19);11/24/19 42/56; 12/19/19 47/56    Time 8    Period Weeks    Status Achieved      PT LONG TERM GOAL #2   Title Patient will be able to perform sit to stand in under 25 seconds to improve overall strength    Baseline bil UE 42 seconds (12/19/19)    Time 8    Period Weeks    Status Revised    Target Date 02/13/20      PT LONG TERM GOAL #3   Title Patient will be able to ambulate 420' with small based quad cane and SBA to improve short distance ambulation    Baseline quad cane 115' (12/19/19)    Time 8    Period Weeks    Status Revised    Target Date 02/13/20      PT LONG TERM GOAL #4   Title Pt will demo improvement by 0.15 m/s with use of st. cane to improve functional gait velocity    Baseline 0.81ms with cane, 0.455m with rolling walker (eval)    Time 8    Status Revised    Target Date 02/13/20                 Plan - 12/19/19 1207    Clinical Impression Statement Patient has been seen for total of 14 sessions from 10/24/19 to 12/19/19 for gait and balance disorder and weakness. Patient has met 2/3 short term functional goals. Patient still has difficulty to be able to stand up from standard chair and relies heavily on UE support to stand up and sit down.Pt has met 1/4 long term goals and making progress towards his long term goals. patient has demonstrated significant improvement in his Berg Balance Scale score. Patient will benefit from continued skilled PT to continue to address his strength, balance and mobility impairments.    Personal Factors and Comorbidities Comorbidity 3+    Comorbidities  craniotomy secondary to glioblastoma, hx of lumbar surgeries x 2, CABG x 4    Examination-Activity Limitations Squat;Stairs;Locomotion Level;Caring for Others;Carry    Examination-Participation Restrictions Cleaning;Community Activity;Driving;Laundry;Yard Work;Shop    Stability/Clinical Decision Making Unstable/Unpredictable    Rehab Potential Good    PT Frequency Other (comment)   2x week for 4 weeks and 1x/week for 4 more weeks for total of 8 weeks.   PT Duration 8 weeks    PT Treatment/Interventions ADLs/Self Care Home Management;Gait training;Stair training;Functional mobility training;Therapeutic activities;Therapeutic exercise;Balance training;Neuromuscular re-education;Vestibular;Visual/perceptual remediation/compensation;Manual techniques;Energy conservation;Joint Manipulations;Patient/family education;Passive range of motion    PT Next Visit Plan Gait training with quad cane    PT Home Exercise Plan SLS, sit to stand without HHA, Access Code: [email or web address]: [email or web address]: 06/03/2021Prepared by: KaGwenyth BouillonatelExercisesStanding Balance in Corner with Eyes Closed - 1 x daily - 7 x weekly - 2 sets - 10 reps    Consulted and Agree with Plan of Care Patient;Family member/caregiver    Family Member Consulted wife           Patient will benefit from skilled therapeutic intervention in order to  improve the following deficits and impairments:  Abnormal gait, Decreased activity tolerance, Decreased balance, Decreased mobility, Decreased endurance, Difficulty walking, Dizziness, Impaired vision/preception  Visit Diagnosis: Homonymous hemianopsia, left  Unsteadiness on feet  Muscle weakness (generalized)  Other abnormalities of gait and mobility  Dizziness and giddiness  Glioblastoma multiforme (HCC)     Problem List Patient Active Problem List   Diagnosis Date Noted  . Glioblastoma multiforme ([place]) 10/11/2019  . Dyslipidemia   . Seizure prophylaxis   .  Chronic diastolic congestive heart failure ([place])   . Diabetic peripheral neuropathy ([place])   . Palliative care by specialist   . Weakness 10/08/2019  . Vasogenic edema ([place]) 10/08/2019  . Gross hematuria 10/08/2019  . DNR (do not resuscitate) discussion 07/13/2019  . S/P craniotomy 07/04/2019  . Glioblastoma with isocitrate dehydrogenase gene wildtype ([place]) 07/04/2019  . Respiratory failure ([place])   . Encephalopathy acute   . Brain mass 07/01/2019  . Fever 07/01/2019  . Atherosclerosis of native artery of both lower extremities with intermittent claudication ([place]) 08/23/2018  . Coronary artery disease without angina pectoris 08/23/2018  . Dyspnea on exertion 08/23/2018  . Biochemically recurrent malignant neoplasm of prostate 08/16/2018  . S/P carotid endarterectomy 08/20/2015  . Essential hypertension 08/20/2015  . Type 2 diabetes mellitus with circulatory disorder ([place]) 08/20/2015  . HLD (hyperlipidemia) 08/20/2015  . Carotid artery stenosis, symptomatic 07/03/2015  . Carotid stenosis, right   . TIA (transient ischemic attack) 06/18/2015  . ASCVD (arteriosclerotic cardiovascular disease) 06/18/2015  . Diabetes mellitus without complication ([place]) 13/24/4010  . Resistant hypertension 06/18/2015  . Hyperlipidemia 06/18/2015  . Spinal stenosis of lumbar region with neurogenic claudication 09/06/2014  . Atherosclerosis of native artery of extremity with intermittent claudication ([place]) 03/09/2013  . HYPERLIPIDEMIA 04/08/2009  . TOBACCO ABUSE 04/08/2009  . ATHEROSCLEROTIC CARDIOVASCULAR DISEASE 04/08/2009  . INTERMITTENT VERTIGO 04/08/2009  . ADENOCARCINOMA, PROSTATE 03/21/2009  . ABDOMINAL PAIN -GENERALIZED 03/21/2009  . History of coronary artery bypass graft 06/28/2008    Kerrie Pleasure 12/19/2019, 12:57 PM  [facility] [address] [place], [place], [address] Phone: [phone]   Fax:  [phone]  Name: [name] MRN: [id number] Date of Birth: 06-Mar-1946

## 2019-12-21 ENCOUNTER — Other Ambulatory Visit: Payer: Self-pay

## 2019-12-21 ENCOUNTER — Encounter: Payer: Medicare Other | Admitting: Occupational Therapy

## 2019-12-21 ENCOUNTER — Ambulatory Visit: Payer: Medicare Other

## 2019-12-21 DIAGNOSIS — C719 Malignant neoplasm of brain, unspecified: Secondary | ICD-10-CM

## 2019-12-21 NOTE — Progress Notes (Signed)
Patient's wife called and requested an order for a chairlift. Per [name] ok to place order for chairlift. Order faxed to [phone] per wife's request. Confirmation fax received.

## 2019-12-22 ENCOUNTER — Ambulatory Visit: Payer: Medicare Other

## 2019-12-22 ENCOUNTER — Ambulatory Visit: Payer: Medicare Other | Admitting: Occupational Therapy

## 2019-12-26 ENCOUNTER — Other Ambulatory Visit: Payer: Self-pay

## 2019-12-26 ENCOUNTER — Ambulatory Visit: Payer: Medicare Other | Attending: Neurosurgery | Admitting: Occupational Therapy

## 2019-12-26 DIAGNOSIS — R41842 Visuospatial deficit: Secondary | ICD-10-CM

## 2019-12-26 DIAGNOSIS — R42 Dizziness and giddiness: Secondary | ICD-10-CM | POA: Insufficient documentation

## 2019-12-26 DIAGNOSIS — C719 Malignant neoplasm of brain, unspecified: Secondary | ICD-10-CM | POA: Insufficient documentation

## 2019-12-26 DIAGNOSIS — G9340 Encephalopathy, unspecified: Secondary | ICD-10-CM | POA: Insufficient documentation

## 2019-12-26 DIAGNOSIS — R41840 Attention and concentration deficit: Secondary | ICD-10-CM | POA: Diagnosis not present

## 2019-12-26 DIAGNOSIS — R2689 Other abnormalities of gait and mobility: Secondary | ICD-10-CM | POA: Diagnosis not present

## 2019-12-26 DIAGNOSIS — M6281 Muscle weakness (generalized): Secondary | ICD-10-CM | POA: Diagnosis not present

## 2019-12-26 DIAGNOSIS — R2681 Unsteadiness on feet: Secondary | ICD-10-CM

## 2019-12-26 DIAGNOSIS — H53453 Other localized visual field defect, bilateral: Secondary | ICD-10-CM | POA: Diagnosis not present

## 2019-12-26 DIAGNOSIS — R41844 Frontal lobe and executive function deficit: Secondary | ICD-10-CM | POA: Diagnosis not present

## 2019-12-26 DIAGNOSIS — R278 Other lack of coordination: Secondary | ICD-10-CM | POA: Diagnosis not present

## 2019-12-26 DIAGNOSIS — H53462 Homonymous bilateral field defects, left side: Secondary | ICD-10-CM | POA: Diagnosis not present

## 2019-12-26 NOTE — Therapy (Signed)
[facility] [address] [place], [place], [address] Phone: [phone]   Fax:  [phone]  Occupational Therapy Treatment  Patient Details  Name: [name] MRN: [id number] Date of Birth: 25-Feb-1946 Referring Provider (OT): Dr. Bertis Ruddy   Encounter Date: 12/26/2019   OT End of Session - 12/26/19 1242    Visit Number 15    Number of Visits 25    Date for OT Re-Evaluation 01/23/20    Authorization Type MCR primary, AARP secondary    Authorization Time Period 90 days, may d/c after 8 weeks dep on progress.    Authorization - Visit Number 15    Authorization - Number of Visits 20    Progress Note Due on Visit 20    OT Start Time 1237    OT Stop Time 1316    OT Time Calculation (min) 39 min    Activity Tolerance Patient tolerated treatment well    Behavior During Therapy WFL for tasks assessed/performed           Past Medical History:  Diagnosis Date  . Anxiety   . Arthritis   . ASCVD (arteriosclerotic cardiovascular disease)    03/2004: DES x2 to the RCA; IMI with stent occlusion in 02/2005- suboptimal Plavix compliance  . CAD (coronary artery disease)   . Carcinoma of prostate ([facility])    Clopidogrel held for biopsy  . Constipation due to pain medication   . Diabetes mellitus without complication ([facility])   . Diabetic neuropathy ([facility])   . Headache 2021  . Hyperlipidemia   . Hypertension   . Mild depression ([facility])   . Morbid obesity ([facility])   . Myocardial infarction ([facility])   . Shortness of breath dyspnea   . Sleep apnea    no longer uses cpap  . Stroke ([facility]) 2017  . Tobacco abuse    stopped smoking 06/28/09    Past Surgical History:  Procedure Laterality Date  . APPLICATION OF CRANIAL NAVIGATION N/A 07/04/2019   Procedure: APPLICATION OF CRANIAL NAVIGATION;  Surgeon: Ashok Pall, MD;  Location: [facility];  Service: Neurosurgery;  Laterality: N/A;  . BACK SURGERY    . CARDIAC CATHETERIZATION     X  1 stent before having CABG  . CORONARY ARTERY BYPASS GRAFT  06/28/2008   X4  . CRANIOTOMY Right 07/04/2019   Procedure: Right occipital craniotomy for tumor with brainlab;  Surgeon: Ashok Pall, MD;  Location: [facility];  Service: Neurosurgery;  Laterality: Right;  . ENDARTERECTOMY Right 07/03/2015   Procedure: RIGHT CAROTID ENDARTERECTOMY WITH BOVINE PERICARDIUM PATCH ANGIOPLASTY;  Surgeon: Serafina Mitchell, MD;  Location: [facility];  Service: Vascular;  Laterality: Right;  . LUMBAR LAMINECTOMY/DECOMPRESSION MICRODISCECTOMY N/A 09/06/2014   Procedure: LUMBER DECOMPRESSION L3-5;  Surgeon: Melina Schools, MD;  Location: [facility];  Service: Orthopedics;  Laterality: N/A;  . LUMBAR SPINE SURGERY  2002  . PROSTATE BIOPSY    . PROSTATECTOMY  02/2007  . VASECTOMY      There were no vitals filed for this visit.   Subjective Assessment - 12/26/19 1240    Subjective  Pt said "didn't really do anything" when asked about his birthday. Patient's spouse reports MRI scheduled for Friday.    Patient is accompanied by: Family member   wife   Pertinent History [name] is a 74 y.o. 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, prostate cancer with radiation therapy prostatectomy, CAD with CABG maintained on Xarelto, chronic diastolic congestive heart failure.  Lives with spouse two-level home he had been receiving outpatient therapies.  Presented 10/08/2019 with increasing left-sided weakness headache nausea vomiting as well as bouts of hematuria.   CT of the head showed new severe vasogenic edema throughout the posterior right cerebral hemisphere with 8 mm leftNo evidence of acute intracranial abnormality.  Follow-up medical oncology maintained on Decadron therapy.    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 No/denies                 Treatment:   Visual Spatial and Perceptual Block designs while scanning to the left x 3 simple patterns with max assistance for second pattern with following pattern and lining up blocks  Connect 4 with following pattern, scanning to left and utilizing LUE for reaching and placing chips. Min verbal cues required for following patterns. Patient demonstrated increased recall of strategy to scan all the way to the left of the activity.  Patient worked on visual scanning and scanning environment while ambulating with 2wrw with contact guard supervision. Patient found 6/9 cards successfully with no assistance with missing 2 on right and 1 on left.  Patient engaged in visual scanning in game of Spot It with min to mod visual and verbal cues for identifying like images on 2 cards.                   OT Short Term Goals - 12/26/19 1320      OT SHORT TERM GOAL #1   Title Pt to verbalize understanding of visual compensatory strategies to improve daily function including reading and environmental scanning    Time 4    Period Weeks    Status Achieved      OT SHORT TERM GOAL #2   Title Pt to perform tabletop scanning with 90% or greater accuracy using strategies    Baseline 85% for 1.5 M number cancellation    Time 4    Period Weeks    Status On-going   28/80 missed     OT SHORT TERM GOAL #3   Title Pt will read 3/4 page of text with accuracy, visual aids/AE and extra time PRN    Time 4    Period Weeks    Status Achieved   met with larger print double spaced and line guide     OT SHORT TERM GOAL #4   Title Pt will perform dressing with supervision/ set up    Baseline min A for shoes/ socks    Time 4    Period Weeks    Status On-going   ongoing, not perfroming at home     OT SHORT TERM GOAL #5   Title Pt will demonstrate improved LUE fine motor coordination for ADLs as  evidenced by decreasing 9 hole peg test score to 52 secs or less.    Baseline RUE 33 secs, LUE 58.25    Time 4    Period Weeks    Status On-going   11/21/19:  68.41sec     OT SHORT TERM GOAL #6   Title Pt will demonstrate ability to retrieve a llightweight object at 130 shoulder flexion with LUE demonstrating good control.    Baseline shoulder flexion RUE 140, LUE 120  Time 4    Period Weeks    Status Achieved   11/21/19:  130*            OT Long Term Goals - 10/25/19 0747      OT LONG TERM GOAL #1   Title Pt will perform environmental scanning at 90% accuracy in mod distracting environment    Time 12    Period Weeks    Status New    Target Date 01/23/20      OT LONG TERM GOAL #2   Title Pt to read full page of text or greater with visual aids/AE prn    Time 12    Period Weeks    Status New      OT LONG TERM GOAL #3   Title Pt will demonstrate improved fine motor coordination in LUE for ADLS as evidenced by decreasing 9 hole peg test score to 48 secs or less.    Baseline Pt will perform all basic ADLS with distant supervision.    Time 12    Period Weeks    Status New      OT LONG TERM GOAL #4   Title Pt will perform basic home management tasks with supervision demonstrating good safety awareness.    Time 12    Period Weeks    Status New                 Plan - 12/26/19 1244    Clinical Impression Statement Patient demonstrating increased difficulty with executive funtionining but improvement noted with recall of strategy of scanning all the way to the left of the task.    Occupational performance deficits (Please refer to evaluation for details): IADL's;Work;Leisure;Social Participation;ADL's    Body Structure / Function / Physical Skills IADL;Endurance;Balance;Vision;ADL;Mobility;Strength;UE functional use;FMC;Coordination;Gait;GMC;ROM;Decreased knowledge of use of DME;Dexterity    Cognitive Skills Memory;Problem Solve;Perception;Thought;Safety Awareness     Comorbidities impacting occupational performance description: glioblastoma - pt has begun radiation    OT Frequency 2x / week    OT Duration 12 weeks    OT Treatment/Interventions Self-care/ADL training;Functional Mobility Training;Therapeutic activities;Coping strategies training;Visual/perceptual remediation/compensation;Patient/family education;Cognitive remediation/compensation;Therapeutic exercise;Balance training;Manual Therapy;Neuromuscular education;Aquatic Therapy;Energy conservation;DME and/or AE instruction;Paraffin;Fluidtherapy;Gait Training;Passive range of motion;Moist Heat    Plan LUE coordination and visual scanning    Consulted and Agree with Plan of Care Patient;Family member/caregiver    Family Member Consulted wife           Patient will benefit from skilled therapeutic intervention in order to improve the following deficits and impairments:   Body Structure / Function / Physical Skills: IADL, Endurance, Balance, Vision, ADL, Mobility, Strength, UE functional use, FMC, Coordination, Gait, GMC, ROM, Decreased knowledge of use of DME, Dexterity Cognitive Skills: Memory, Problem Solve, Perception, Thought, Safety Awareness     Visit Diagnosis: Homonymous hemianopsia, left  Unsteadiness on feet  Muscle weakness (generalized)  Visuospatial deficit  Attention and concentration deficit  Other lack of coordination  Frontal lobe and executive function deficit    Problem List Patient Active Problem List   Diagnosis Date Noted  . Glioblastoma multiforme ([facility]) 10/11/2019  . Dyslipidemia   . Seizure prophylaxis   . Chronic diastolic congestive heart failure ([facility])   . Diabetic peripheral neuropathy ([facility])   . Palliative care by specialist   . Weakness 10/08/2019  . Vasogenic edema ([facility]) 10/08/2019  . Gross hematuria 10/08/2019  . DNR (do not resuscitate) discussion 07/13/2019  . S/P craniotomy 07/04/2019  . Glioblastoma with isocitrate dehydrogenase gene wildtype  ([facility])  07/04/2019  . Respiratory failure ([facility])   . Encephalopathy acute   . Brain mass 07/01/2019  . Fever 07/01/2019  . Atherosclerosis of native artery of both lower extremities with intermittent claudication ([facility]) 08/23/2018  . Coronary artery disease without angina pectoris 08/23/2018  . Dyspnea on exertion 08/23/2018  . Biochemically recurrent malignant neoplasm of prostate 08/16/2018  . S/P carotid endarterectomy 08/20/2015  . Essential hypertension 08/20/2015  . Type 2 diabetes mellitus with circulatory disorder ([facility]) 08/20/2015  . HLD (hyperlipidemia) 08/20/2015  . Carotid artery stenosis, symptomatic 07/03/2015  . Carotid stenosis, right   . TIA (transient ischemic attack) 06/18/2015  . ASCVD (arteriosclerotic cardiovascular disease) 06/18/2015  . Diabetes mellitus without complication ([facility]) 37/94/3276  . Resistant hypertension 06/18/2015  . Hyperlipidemia 06/18/2015  . Spinal stenosis of lumbar region with neurogenic claudication 09/06/2014  . Atherosclerosis of native artery of extremity with intermittent claudication ([facility]) 03/09/2013  . HYPERLIPIDEMIA 04/08/2009  . TOBACCO ABUSE 04/08/2009  . ATHEROSCLEROTIC CARDIOVASCULAR DISEASE 04/08/2009  . INTERMITTENT VERTIGO 04/08/2009  . ADENOCARCINOMA, PROSTATE 03/21/2009  . ABDOMINAL PAIN -GENERALIZED 03/21/2009  . History of coronary artery bypass graft 06/28/2008    Zachery Conch MOT, OTR/L 12/26/2019, 1:22 PM  [facility] [address] [place], [place], [address] Phone: [phone]   Fax:  [phone]  Name: [name] MRN: [id number] Date of Birth: 02-03-1946

## 2019-12-27 ENCOUNTER — Ambulatory Visit: Payer: Medicare Other

## 2019-12-27 DIAGNOSIS — H53462 Homonymous bilateral field defects, left side: Secondary | ICD-10-CM

## 2019-12-27 DIAGNOSIS — R278 Other lack of coordination: Secondary | ICD-10-CM | POA: Diagnosis not present

## 2019-12-27 DIAGNOSIS — R41842 Visuospatial deficit: Secondary | ICD-10-CM | POA: Diagnosis not present

## 2019-12-27 DIAGNOSIS — R2681 Unsteadiness on feet: Secondary | ICD-10-CM

## 2019-12-27 DIAGNOSIS — R2689 Other abnormalities of gait and mobility: Secondary | ICD-10-CM

## 2019-12-27 DIAGNOSIS — M6281 Muscle weakness (generalized): Secondary | ICD-10-CM | POA: Diagnosis not present

## 2019-12-27 DIAGNOSIS — R41840 Attention and concentration deficit: Secondary | ICD-10-CM | POA: Diagnosis not present

## 2019-12-27 NOTE — Therapy (Signed)
[facility] [address] [place], [place], [address] Phone: [phone]   Fax:  [phone]  Physical Therapy Treatment  Patient Details  Name: [name] MRN: [id number] Date of Birth: Jul 19, 1945 Referring Provider (PT): [name]   Encounter Date: 12/27/2019   PT End of Session - 12/27/19 1453    Visit Number 15    Number of Visits 26    Date for PT Re-Evaluation 02/13/20    Authorization Type 10 V PN on 0/08/67; recert 10/06/48 (2x/wek for 4 weeks, 1x/week for 4 more weeks - total 8 weeks)    Progress Note Due on Visit 28    PT Start Time 1447    PT Stop Time 1529    PT Time Calculation (min) 42 min    Equipment Utilized During Treatment Gait belt    Activity Tolerance Patient tolerated treatment well;Patient limited by fatigue    Behavior During Therapy WFL for tasks assessed/performed           Past Medical History:  Diagnosis Date  . Anxiety   . Arthritis   . ASCVD (arteriosclerotic cardiovascular disease)    03/2004: DES x2 to the RCA; IMI with stent occlusion in 02/2005- suboptimal Plavix compliance  . CAD (coronary artery disease)   . Carcinoma of prostate ([facility])    Clopidogrel held for biopsy  . Constipation due to pain medication   . Diabetes mellitus without complication ([facility])   . Diabetic neuropathy ([facility])   . Headache 2021  . Hyperlipidemia   . Hypertension   . Mild depression ([facility])   . Morbid obesity ([facility])   . Myocardial infarction ([facility])   . Shortness of breath dyspnea   . Sleep apnea    no longer uses cpap  . Stroke ([facility]) 2017  . Tobacco abuse    stopped smoking 06/28/09    Past Surgical History:  Procedure Laterality Date  . APPLICATION OF CRANIAL NAVIGATION N/A 07/04/2019   Procedure: APPLICATION OF CRANIAL NAVIGATION;  Surgeon: Ashok Pall, MD;  Location: [facility];  Service: Neurosurgery;  Laterality: N/A;  . BACK SURGERY    . CARDIAC CATHETERIZATION     X 1 stent before  having CABG  . CORONARY ARTERY BYPASS GRAFT  06/28/2008   X4  . CRANIOTOMY Right 07/04/2019   Procedure: Right occipital craniotomy for tumor with brainlab;  Surgeon: Ashok Pall, MD;  Location: [facility];  Service: Neurosurgery;  Laterality: Right;  . ENDARTERECTOMY Right 07/03/2015   Procedure: RIGHT CAROTID ENDARTERECTOMY WITH BOVINE PERICARDIUM PATCH ANGIOPLASTY;  Surgeon: Serafina Mitchell, MD;  Location: [facility];  Service: Vascular;  Laterality: Right;  . LUMBAR LAMINECTOMY/DECOMPRESSION MICRODISCECTOMY N/A 09/06/2014   Procedure: LUMBER DECOMPRESSION L3-5;  Surgeon: Melina Schools, MD;  Location: [facility];  Service: Orthopedics;  Laterality: N/A;  . LUMBAR SPINE SURGERY  2002  . PROSTATE BIOPSY    . PROSTATECTOMY  02/2007  . VASECTOMY      There were no vitals filed for this visit.   Subjective Assessment - 12/27/19 1451    Subjective Patient reports no new changes/complaints. No falls. No pain today. Patient has MRI on Friday.    Patient is accompained by: Family member    Pertinent History PMH: Rt occipital craniotomy 07/04/19 secondary to glioblastoma. PMH: Prostate CA, CVA 2017 (no residual deficits), CAD, DM, HTN, hx of 2 lumbar surgeries 2002, 2016 (laminectomies)    Limitations Standing;Walking    How long can you sit comfortably? no issues  How long can you stand comfortably? 5 min    How long can you walk comfortably? 5 min    Patient Stated Goals walk better    Currently in Pain? No/denies                             [facility] Adult PT Treatment/Exercise - 12/27/19 0001      Transfers   Transfers Sit to Stand;Stand to Sit    Sit to Stand 5: Supervision    Stand to Sit 5: Supervision    Comments completed x 5 reps from mat at standard chair height with UE and no RW. increased fatigue noted with completion      Ambulation/Gait   Ambulation/Gait Yes    Ambulation/Gait Assistance 5: Supervision    Ambulation/Gait Assistance Details completed gait training with  RW x 115 ft, followed by gait with small base quad cane x 115 ft. patient demo proper sequencing of cane, but demo decreased stability and slowed gait speed. Overall patient reports and demos improved stability with RW as well as improved endurance with more supportive AD at this time.     Ambulation Distance (Feet) 115 Feet   x 2   Assistive device Small based quad cane;Rolling walker    Gait Pattern Decreased stance time - left;Decreased stride length;Decreased hip/knee flexion - left;Decreased dorsiflexion - left;Antalgic;Decreased trunk rotation;Wide base of support;Poor foot clearance - left    Ambulation Surface Level;Indoor      Neuro Re-ed    Neuro Re-ed Details  Standing without UE support at RW, completed alternating toe taps to colored pebbles 1 x 15 reps. CGA required throughout. Verbal encouragement required intially as patient hesistant due to balance.       Exercises   Exercises Other Exercises    Other Exercises  completed following supine exercises with red theraband including: bent knee fall outs 1 x 15 reps bilaterally, and alternating marching x 15 reps. verbal cues for control and slowed pace with completion. PT providing tactile cues/demo as needed for proper technique.                Balance Exercises - 12/27/19 0001      Balance Exercises: Standing   Standing Eyes Opened Narrow base of support (BOS);Head turns;Solid surface;Limitations    Standing Eyes Opened Limitations completed initially with feet shoulder width apart, 1 x 10 reps of horizontal/vertical head turns. progressed to completing 1 x 10 reps of horizontal/vertical head turns with narrow BOS. all completed with RW placed in front but no UE support    Standing Eyes Closed Wide (BOA);Solid surface;3 reps;Time    Standing Eyes Closed Time 10-15 secs               PT Short Term Goals - 12/19/19 1240      PT SHORT TERM GOAL #1   Title Pt will be able to perform 5x sit to stand under 30 seconds with  bil UE to improve functional strength from standard chair.    Baseline Pt unable to perform them (10/24/19); unable to without UE support (11/24/19); attempted with one UE but pt only able to complete 1 rep with multiple tries; pt was able to stand up using one UE on chair and second hand on walker (12/12/19); pt able to complete 3 reps with R UE on chair and L UE on walker but unable to complete 5 reps (12/19/19); bil UE use 42 seconds  Time 4    Period Weeks    Status Revised    Target Date 01/16/20      PT SHORT TERM GOAL #2   Title Patient will be able to ambulate for 10 min without rest with use of his walker to improve walking endurnace    Baseline <5 min (10/24/19); 5-10 min (11/29/19)    Time 4    Period Weeks    Status Achieved    Target Date 11/21/19      PT SHORT TERM GOAL #3   Title Pt will demo 4 points improvement on Berg balance scale to improve overall static balance    Baseline 37/56 (10/24/19); 8/6 42/56    Time 4    Period Weeks    Status Achieved    Target Date 11/21/19             PT Long Term Goals - 12/19/19 1246      PT LONG TERM GOAL #1   Title Pt will demo 47/56 or better on BBS to improve balance and reduce fall risk    Baseline 37/26 (10/24/19);11/24/19 42/56; 12/19/19 47/56    Time 8    Period Weeks    Status Achieved      PT LONG TERM GOAL #2   Title Patient will be able to perform sit to stand in under 25 seconds to improve overall strength    Baseline bil UE 42 seconds (12/19/19)    Time 8    Period Weeks    Status Revised    Target Date 02/13/20      PT LONG TERM GOAL #3   Title Patient will be able to ambulate 420' with small based quad cane and SBA to improve short distance ambulation    Baseline quad cane 115' (12/19/19)    Time 8    Period Weeks    Status Revised    Target Date 02/13/20      PT LONG TERM GOAL #4   Title Pt will demo improvement by 0.15 m/s with use of st. cane to improve functional gait velocity    Baseline 0.66m/s with  cane, 0.21m/s with rolling walker (eval)    Time 8    Status Revised    Target Date 02/13/20                 Plan - 12/27/19 1530    Clinical Impression Statement Today's skilled PT session included standing balance activties working toward completing without UE support as tolerated. Increased balance challenge with vision removed and SLS activities. Compelted gait training with RW vs. small base [employer]. patient demo improved stability and gait pattern at this time with small base quad cane. Will continue to progress toward goals.    Personal Factors and Comorbidities Comorbidity 3+    Comorbidities craniotomy secondary to glioblastoma, hx of lumbar surgeries x 2, CABG x 4    Examination-Activity Limitations Squat;Stairs;Locomotion Level;Caring for Others;Carry    Examination-Participation Restrictions Cleaning;Community Activity;Driving;Laundry;Yard Work;Shop    Stability/Clinical Decision Making Unstable/Unpredictable    Rehab Potential Good    PT Frequency Other (comment)   2x week for 4 weeks and 1x/week for 4 more weeks for total of 8 weeks.   PT Duration 8 weeks    PT Treatment/Interventions ADLs/Self Care Home Management;Gait training;Stair training;Functional mobility training;Therapeutic activities;Therapeutic exercise;Balance training;Neuromuscular re-education;Vestibular;Visual/perceptual remediation/compensation;Manual techniques;Energy conservation;Joint Manipulations;Patient/family education;Passive range of motion    PT Next Visit Plan Gait training with quad cane    PT  Home Exercise Plan SLS, sit to stand without HHA, Access Code: [email or web address]: [email or web address]: 06/03/2021Prepared by: Gwenyth Bouillon PatelExercisesStanding Balance in Corner with Eyes Closed - 1 x daily - 7 x weekly - 2 sets - 10 reps    Consulted and Agree with Plan of Care Patient;Family member/caregiver    Family Member Consulted wife           Patient will benefit from skilled  therapeutic intervention in order to improve the following deficits and impairments:  Abnormal gait, Decreased activity tolerance, Decreased balance, Decreased mobility, Decreased endurance, Difficulty walking, Dizziness, Impaired vision/preception  Visit Diagnosis: Unsteadiness on feet  Muscle weakness (generalized)  Other abnormalities of gait and mobility  Homonymous hemianopsia, left     Problem List Patient Active Problem List   Diagnosis Date Noted  . Glioblastoma multiforme ([facility]) 10/11/2019  . Dyslipidemia   . Seizure prophylaxis   . Chronic diastolic congestive heart failure ([facility])   . Diabetic peripheral neuropathy ([facility])   . Palliative care by specialist   . Weakness 10/08/2019  . Vasogenic edema ([place] [facility]) 10/08/2019  . Gross hematuria 10/08/2019  . DNR (do not resuscitate) discussion 07/13/2019  . S/P craniotomy 07/04/2019  . Glioblastoma with isocitrate dehydrogenase gene wildtype ([facility]) 07/04/2019  . Respiratory failure ([facility])   . Encephalopathy acute   . Brain mass 07/01/2019  . Fever 07/01/2019  . Atherosclerosis of native artery of both lower extremities with intermittent claudication ([facility]) 08/23/2018  . Coronary artery disease without angina pectoris 08/23/2018  . Dyspnea on exertion 08/23/2018  . Biochemically recurrent malignant neoplasm of prostate 08/16/2018  . S/P carotid endarterectomy 08/20/2015  . Essential hypertension 08/20/2015  . Type 2 diabetes mellitus with circulatory disorder ([facility]) 08/20/2015  . HLD (hyperlipidemia) 08/20/2015  . Carotid artery stenosis, symptomatic 07/03/2015  . Carotid stenosis, right   . TIA (transient ischemic attack) 06/18/2015  . ASCVD (arteriosclerotic cardiovascular disease) 06/18/2015  . Diabetes mellitus without complication ([facility]) 00/76/2263  . Resistant hypertension 06/18/2015  . Hyperlipidemia 06/18/2015  . Spinal stenosis of lumbar region with neurogenic claudication 09/06/2014  . Atherosclerosis of native artery  of extremity with intermittent claudication ([place]) 03/09/2013  . HYPERLIPIDEMIA 04/08/2009  . TOBACCO ABUSE 04/08/2009  . ATHEROSCLEROTIC CARDIOVASCULAR DISEASE 04/08/2009  . INTERMITTENT VERTIGO 04/08/2009  . ADENOCARCINOMA, PROSTATE 03/21/2009  . ABDOMINAL PAIN -GENERALIZED 03/21/2009  . History of coronary artery bypass graft 06/28/2008    Jones Bales, PT, DPT 12/27/2019, 4:14 PM  [facility] [address] [place], [place], [address] Phone: [phone]   Fax:  [phone]  Name: [name] MRN: [id number] Date of Birth: [birth year]

## 2019-12-29 ENCOUNTER — Ambulatory Visit: Payer: Medicare Other | Admitting: Occupational Therapy

## 2019-12-29 ENCOUNTER — Ambulatory Visit
Admission: RE | Admit: 2019-12-29 | Discharge: 2019-12-29 | Disposition: A | Discharge status: Home / Self Care | Payer: Medicare Other | Source: Ambulatory Visit | Attending: Internal Medicine | Admitting: Internal Medicine

## 2019-12-29 DIAGNOSIS — C719 Malignant neoplasm of brain, unspecified: Secondary | ICD-10-CM

## 2019-12-29 DIAGNOSIS — C729 Malignant neoplasm of central nervous system, unspecified: Secondary | ICD-10-CM | POA: Diagnosis not present

## 2019-12-29 MED ORDER — GADOBENATE DIMEGLUMINE 529 MG/ML IV SOLN
19.0000 mL | Freq: Once | INTRAVENOUS | Status: AC | PRN
  Administered 2019-12-29: 19 mL via INTRAVENOUS

## 2020-01-02 ENCOUNTER — Inpatient Hospital Stay: Payer: Medicare Other | Attending: Internal Medicine | Admitting: Internal Medicine

## 2020-01-02 ENCOUNTER — Other Ambulatory Visit: Payer: Self-pay | Admitting: Internal Medicine

## 2020-01-02 ENCOUNTER — Inpatient Hospital Stay: Payer: Medicare Other

## 2020-01-02 ENCOUNTER — Other Ambulatory Visit: Payer: Self-pay

## 2020-01-02 VITALS — BP 138/81 | HR 81 | Temp 98.1°F | Resp 18 | Ht 74.0 in | Wt 201.1 lb

## 2020-01-02 DIAGNOSIS — C714 Malignant neoplasm of occipital lobe: Secondary | ICD-10-CM | POA: Insufficient documentation

## 2020-01-02 DIAGNOSIS — D696 Thrombocytopenia, unspecified: Secondary | ICD-10-CM | POA: Insufficient documentation

## 2020-01-02 DIAGNOSIS — C719 Malignant neoplasm of brain, unspecified: Secondary | ICD-10-CM

## 2020-01-02 LAB — CBC WITH DIFFERENTIAL (CANCER CENTER ONLY)
Abs Immature Granulocytes: 0.06 10*3/uL (ref 0.00–0.07)
Basophils Absolute: 0 10*3/uL (ref 0.0–0.1)
Basophils Relative: 0 %
Eosinophils Absolute: 0 10*3/uL (ref 0.0–0.5)
Eosinophils Relative: 0 %
HCT: 36.5 % — ABNORMAL LOW (ref 39.0–52.0)
Hemoglobin: 12.4 g/dL — ABNORMAL LOW (ref 13.0–17.0)
Immature Granulocytes: 2 %
Lymphocytes Relative: 6 %
Lymphs Abs: 0.2 10*3/uL — ABNORMAL LOW (ref 0.7–4.0)
MCH: 29.8 pg (ref 26.0–34.0)
MCHC: 34 g/dL (ref 30.0–36.0)
MCV: 87.7 fL (ref 80.0–100.0)
Monocytes Absolute: 0.4 10*3/uL (ref 0.1–1.0)
Monocytes Relative: 9 %
Neutro Abs: 3.5 10*3/uL (ref 1.7–7.7)
Neutrophils Relative %: 83 %
Platelet Count: 92 10*3/uL — ABNORMAL LOW (ref 150–400)
RBC: 4.16 MIL/uL — ABNORMAL LOW (ref 4.22–5.81)
RDW: 16.6 % — ABNORMAL HIGH (ref 11.5–15.5)
WBC Count: 4.1 10*3/uL (ref 4.0–10.5)
nRBC: 0.5 % — ABNORMAL HIGH (ref 0.0–0.2)

## 2020-01-02 LAB — CMP (CANCER CENTER ONLY)
ALT: 40 U/L (ref 0–44)
AST: 21 U/L (ref 15–41)
Albumin: 3.6 g/dL (ref 3.5–5.0)
Alkaline Phosphatase: 80 U/L (ref 38–126)
Anion gap: 6 (ref 5–15)
BUN: 20 mg/dL (ref 8–23)
CO2: 30 mmol/L (ref 22–32)
Calcium: 9 mg/dL (ref 8.9–10.3)
Chloride: 97 mmol/L — ABNORMAL LOW (ref 98–111)
Creatinine: 0.75 mg/dL (ref 0.61–1.24)
GFR, Est AFR Am: >60 mL/min (ref >60)
GFR, Estimated: >60 mL/min (ref >60)
Glucose, Bld: 173 mg/dL — ABNORMAL HIGH (ref 70–99)
Potassium: 4.1 mmol/L (ref 3.5–5.1)
Sodium: 133 mmol/L — ABNORMAL LOW (ref 135–145)
Total Bilirubin: 1.8 mg/dL — ABNORMAL HIGH (ref 0.3–1.2)
Total Protein: 6.2 g/dL — ABNORMAL LOW (ref 6.5–8.1)

## 2020-01-02 MED ORDER — TEMOZOLOMIDE 140 MG PO CAPS: 140.0000 mg | ORAL_CAPSULE | Freq: Every day | ORAL | 0 refills | Status: DC

## 2020-01-02 MED ORDER — TEMOZOLOMIDE 180 MG PO CAPS: 180.0000 mg | ORAL_CAPSULE | Freq: Every day | ORAL | 0 refills | Status: DC

## 2020-01-02 NOTE — Progress Notes (Signed)
[facility] [place], [place] [address] [phone]   Interval Evaluation  Date of Service: 01/02/20 Patient Name: [name] Patient MRN: [id number] Patient DOB: 01/29/1946 Provider: Ventura Sellers, MD  Identifying Statement:  [name] is a 74 y.o. 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 Christella Noa; path demonstrates Glioblastoma   08/17/2019 - 09/28/2019 Radiation Therapy   IMRT with concurrent daily Temozolomide 54m/m2   10/30/2019 -  Chemotherapy   The patient had temozolomide (TEMODAR) 180 MG capsule, 180 mg (100 % of original dose 180 mg), Oral, Daily, 1 of 1 cycle, Start date: 10/30/2019, End date: -- Dose modification: 180 mg (original dose 180 mg, Cycle 1) temozolomide (TEMODAR) 140 MG capsule, 200 mg/m2/day = 420 mg (original dose 140 mg), Oral, Daily, 1 of 1 cycle, Start date: 11/30/2019, End date: -- Dose modification: 140 mg (original dose 140 mg, Cycle 1), 200 mg/m2/day (original dose 140 mg, Cycle 1)  for chemotherapy treatment.      Biomarkers:  MGMT Unmethylated.  IDH 1/2 Wild type.  EGFR Not expressed  TERT Unknown   Interval History:  Mark NEWSTROMpresents to clinic today following recent MRI brain.  He has completed cycle #2, now at full dose 5-day Temodar, no issues with chemo. No further seizures or progression of left sided weakness.  Fatigue has been stable overall.  He has been walking primarily with a cane assist. Overall his left sided vision is stable from prior.  Continues 451min AM, 43m70mn PM dosing schedule of decadron.   H+P (07/13/19) Patient presented in early March 2021 with several days of progressive left sided visual  impairment.  He noticed "missing things" in the left side of his field in his work and while driving.  He then developed headaches which were new for him.  Never complained of difficulty walking, speaking, using arms or hands.  CNS imaging demonstrated tumor within right occipital lobe, which was biopsied by Dr. CabChristella Noa 07/04/19.  Following surgery, he has no new complaints or progressive deficits.  He may be taking decadron 4mg86mice per day.  Otherwise his functional status is only limited by visual impairment. Worked at [employer] lives with his wife of 50 y50rs.  Medications: Current Outpatient Medications on File Prior to Visit  Medication Sig Dispense Refill  . acetaminophen (TYLENOL) 325 MG tablet Take 2 tablets (650 mg total) by mouth every 6 (six) hours as needed for mild pain (or Fever >/= 101). 30 tablet 0  . amLODipine (NORVASC) 10 MG tablet Take 1 tablet (10 mg total) by mouth every evening. 30 tablet 0  . atorvastatin (LIPITOR) 80 MG tablet Take 1 tablet (80 mg total) by mouth at bedtime. 30 tablet 0  . calcium citrate (CALCITRATE - DOSED IN MG ELEMENTAL CALCIUM) 950 (200 Ca) MG tablet Take 1 tablet (200 mg of elemental calcium total) by mouth daily. 30 tablet 0  . dexamethasone (DECADRON) 2 MG tablet Take 1.5 tablets (3 mg total) by mouth 2 (two) times daily. 60 tablet 2  . levETIRAcetam (KEPPRA) 500 MG tablet Take 1 tablet (500 mg total) by mouth 2 (two) times daily. 60 tablet 3  . metoprolol succinate (TOPROL-XL) 25  MG 24 hr tablet Take 0.5 tablets (12.5 mg total) by mouth every evening. 30 tablet 0  . ondansetron (ZOFRAN) 8 MG tablet Take 1 tablet (8 mg total) by mouth 2 (two) times daily as needed (nausea and vomiting). May take 30-60 minutes prior to Temodar administration if nausea/vomiting occurs. 30 tablet 1  . polyethylene glycol (MIRALAX / GLYCOLAX) 17 g packet Take 17 g by mouth daily as needed (constipation). 14 each 0  . rivaroxaban (XARELTO) 20 MG TABS tablet  Take 1 tablet (20 mg total) by mouth daily with supper. (Patient not taking: Reported on 11/30/2019) 30 tablet 0  . temozolomide (TEMODAR) 140 MG capsule Take 1 capsule (140 mg total) by mouth daily. May take on an empty stomach to decrease nausea & vomiting. (Patient not taking: Reported on 11/30/2019) 5 capsule 0  . temozolomide (TEMODAR) 140 MG capsule Take 3 capsules (420 mg total) by mouth daily. May take on an empty stomach to decrease nausea & vomiting. 15 capsule 0  . temozolomide (TEMODAR) 180 MG capsule Take 1 capsule (180 mg total) by mouth daily. May take on an empty stomach to decrease nausea & vomiting. (Patient not taking: Reported on 11/30/2019) 5 capsule 0   No current facility-administered medications on file prior to visit.    Allergies:  Allergies  Allergen Reactions  . Lisinopril Cough   Past Medical History:  Past Medical History:  Diagnosis Date  . Anxiety   . Arthritis   . ASCVD (arteriosclerotic cardiovascular disease)    03/2004: DES x2 to the RCA; IMI with stent occlusion in 02/2005- suboptimal Plavix compliance  . CAD (coronary artery disease)   . Carcinoma of prostate ([place])    Clopidogrel held for biopsy  . Constipation due to pain medication   . Diabetes mellitus without complication ([place])   . Diabetic neuropathy ()   . Headache 2021  . Hyperlipidemia   . Hypertension   . Mild depression ([place])   . Morbid obesity ([place])   . Myocardial infarction ([place])   . Shortness of breath dyspnea   . Sleep apnea    no longer uses cpap  . Stroke ([facility]) 2017  . Tobacco abuse    stopped smoking 06/28/09   Past Surgical History:  Past Surgical History:  Procedure Laterality Date  . APPLICATION OF CRANIAL NAVIGATION N/A 07/04/2019   Procedure: APPLICATION OF CRANIAL NAVIGATION;  Surgeon: Ashok Pall, MD;  Location: [facility];  Service: Neurosurgery;  Laterality: N/A;  . BACK SURGERY    . CARDIAC CATHETERIZATION     X 1 stent before having CABG  . CORONARY ARTERY  BYPASS GRAFT  06/28/2008   X4  . CRANIOTOMY Right 07/04/2019   Procedure: Right occipital craniotomy for tumor with brainlab;  Surgeon: Ashok Pall, MD;  Location: [facility];  Service: Neurosurgery;  Laterality: Right;  . ENDARTERECTOMY Right 07/03/2015   Procedure: RIGHT CAROTID ENDARTERECTOMY WITH BOVINE PERICARDIUM PATCH ANGIOPLASTY;  Surgeon: Serafina Mitchell, MD;  Location: [facility];  Service: Vascular;  Laterality: Right;  . LUMBAR LAMINECTOMY/DECOMPRESSION MICRODISCECTOMY N/A 09/06/2014   Procedure: LUMBER DECOMPRESSION L3-5;  Surgeon: Melina Schools, MD;  Location: [facility];  Service: Orthopedics;  Laterality: N/A;  . LUMBAR SPINE SURGERY  2002  . PROSTATE BIOPSY    . PROSTATECTOMY  02/2007  . VASECTOMY     Social History:  Social History   Socioeconomic History  . Marital status: Married    Spouse name: Not on file  . Number of children: 4  .  Years of education: Not on file  . Highest education level: Not on file  Occupational History  . Occupation: Technical brewer    Comment: retired  . Occupation: Designer, industrial/product: [employer]    Comment: full time  Tobacco Use  . Smoking status: Former Smoker    Packs/day: 1.00    Years: 40.00    Pack years: 40.00    Types: Cigarettes    Quit date: 06/28/2008    Years since quitting: 11.5  . Smokeless tobacco: Former Systems developer    Quit date: 06/28/2009  Vaping Use  . Vaping Use: Never used  Substance and Sexual Activity  . Alcohol use: Yes    Alcohol/week: 3.0 standard drinks    Types: 2 Glasses of wine, 1 Cans of beer per week    Comment: may have 1 drink a week  . Drug use: No  . Sexual activity: Not on file  Other Topics Concern  . Not on file  Social History Narrative   Married with 3 sons and 1 daughter   Daily caffeine use, 3 per day   Social Determinants of Health   Financial Resource Strain:   . Difficulty of Paying Living Expenses: Not on file  Food Insecurity:   . Worried About Charity fundraiser in the  Last Year: Not on file  . Ran Out of Food in the Last Year: Not on file  Transportation Needs:   . Lack of Transportation (Medical): Not on file  . Lack of Transportation (Non-Medical): Not on file  Physical Activity:   . Days of Exercise per Week: Not on file  . Minutes of Exercise per Session: Not on file  Stress:   . Feeling of Stress : Not on file  Social Connections:   . Frequency of Communication with Friends and Family: Not on file  . Frequency of Social Gatherings with Friends and Family: Not on file  . Attends Religious Services: Not on file  . Active Member of Clubs or Organizations: Not on file  . Attends Archivist Meetings: Not on file  . Marital Status: Not on file  Intimate Partner Violence:   . Fear of Current or Ex-Partner: Not on file  . Emotionally Abused: Not on file  . Physically Abused: Not on file  . Sexually Abused: Not on file   Family History:  Family History  Problem Relation Age of Onset  . Kidney disease Mother   . Stroke Father   . Breast cancer Sister        breast progressed into bone  . Colon cancer Neg Hx   . Prostate cancer Neg Hx   . Pancreatic cancer Neg Hx     Review of Systems: Constitutional: Doesn't report fevers, chills or abnormal weight loss Eyes: Doesn't report blurriness of vision Ears, nose, mouth, throat, and face: Doesn't report sore throat Respiratory: Doesn't report cough, dyspnea or wheezes Cardiovascular: Doesn't report palpitation, chest discomfort  Gastrointestinal:  Doesn't report nausea, constipation, diarrhea GU: Doesn't report incontinence Skin: Doesn't report skin rashes Neurological: Per HPI Musculoskeletal: Doesn't report joint pain Behavioral/Psych: +depression symptoms  Physical Exam: Vitals:   01/02/20 0936  BP: 138/81  Pulse: 81  Resp: 18  Temp: 98.1 F (36.7 C)  SpO2: 98%   KPS: 80. General: Alert, cooperative, pleasant, in no acute distress Head: Normal EENT: No conjunctival  injection or scleral icterus.  Lungs: Resp effort normal Cardiac: Regular rate Abdomen: Non-distended abdomen Skin: No  rashes cyanosis or petechiae. Extremities: mild LE edema  Neurologic Exam: Mental Status: Awake, alert, attentive to examiner. Oriented to self and environment. Language is fluent with intact comprehension.  Cranial Nerves: Visual acuity is grossly normal. Left hemianopia. Extra-ocular movements intact. No ptosis. Face is symmetric Motor: Tone and bulk are normal. Pronator drift left arm, moderate hip girdle weakness bilaterally. Reflexes are symmetric, no pathologic reflexes present.  Sensory: Intact to light touch Gait: Dystaxic, walker assisted  Labs: I have reviewed the data as listed    Component Value Date/Time   NA 134 (L) 11/30/2019 0913   NA 139 05/04/2019 0954   K 4.3 11/30/2019 0913   CL 99 11/30/2019 0913   CO2 26 11/30/2019 0913   GLUCOSE 89 11/30/2019 0913   BUN 21 11/30/2019 0913   BUN 11 05/04/2019 0954   CREATININE 0.78 11/30/2019 0913   CALCIUM 9.2 11/30/2019 0913   PROT 6.5 11/30/2019 0913   PROT 6.8 05/04/2019 0954   ALBUMIN 3.4 (L) 11/30/2019 0913   ALBUMIN 4.5 05/04/2019 0954   AST 24 11/30/2019 0913   ALT 43 11/30/2019 0913   ALKPHOS 96 11/30/2019 0913   BILITOT 1.5 (H) 11/30/2019 0913   GFRNONAA >60 11/30/2019 0913   GFRAA >60 11/30/2019 0913   Lab Results  Component Value Date   WBC 4.1 01/02/2020   NEUTROABS 3.5 01/02/2020   HGB 12.4 (L) 01/02/2020   HCT 36.5 (L) 01/02/2020   MCV 87.7 01/02/2020   PLT 92 (L) 01/02/2020   Imaging:  [facility] Clinician Interpretation: I have personally reviewed the CNS images as listed.  My interpretation, in the context of the patient's clinical presentation, is treatment effect vs true progression  MR BRAIN W WO CONTRAST  Result Date: 12/30/2019 CLINICAL DATA:  Brain/CNS neoplasm, assess treatment response. Right occipital GBM with progression. Tumor related swelling. EXAM: MRI HEAD WITHOUT  AND WITH CONTRAST TECHNIQUE: Multiplanar, multiecho pulse sequences of the brain and surrounding structures were obtained without and with intravenous contrast. CONTRAST:  32m MULTIHANCE GADOBENATE DIMEGLUMINE 529 MG/ML IV SOLN COMPARISON:  MR head without and with contrast 10/27/2019 and 07/22/2019 FINDINGS: Brain: Heterogeneous Lea, mostly peripherally enhancing mass lesion in the right occipital and parietal lobe is again seen, consistent with known GBM. Maximal measurements on sagittal and coronal images are stable at 4.5 x 7.5 by 5.0 cm. Mass effect has decreased from 6 mm to 4 mm of midline shift. No distal enhancement is present. Remote blood products are evident within the lesion. T2 signal changes in the right hemisphere are stable. New enhancement extends into the splenium of the corpus callosum. T2 signal change within the splenium of the corpus callosum extends more significantly on the left. Diffusion-weighted changes within the lesion are stable. No acute infarct or additional hemorrhage is present. Posterior horn of the right lateral ventricle is effaced as before. Lateral ventricles are otherwise normal. No significant extra-axial fluid collection is present. The brainstem and cerebellum are normal. Vascular: Flow is present in the major intracranial arteries. 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 Flow is present in the right sigmoid and jugular vein. Superior sagittal sinus is patent. The straight sinus deep cerebral veins are intact. Skull and upper cervical spine: Right occipital craniotomy is again noted. Post radiation changes are evident in the marrow. Craniocervical junction is otherwise normal. Sinuses/Orbits: Minimal fluid is present in the right mastoid air cells. The paranasal sinuses and mastoid air cells are otherwise clear. The globes and orbits are within  normal limits. IMPRESSION: 1. New enhancement in T2 signal extends into the splenium of the corpus callosum on the  left. This may represent tumor progression. 2. Peripheral enhancement of the right occipital and parietal lobe mass lesion is otherwise stable. 3. T2 signal changes in the right hemisphere are stable. 4. Mass effect has decreased from 6 mm to 4 mm of midline shift. 5. 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 Electronically Signed   By: San Morelle M.D.   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 MRI demonstrates pattern of T2 signal abnormality which may represent non-enhancing progression of tumor along corpus callosum.  Alternately this could represent further radio-inflammatory change given time course and lack of enhancement.    Labs demonstrate mild thrombocytopenia today.    We ultimately recommended continuing treatment with cycle #3 Temozolomide decreased to initial dose 119m/m2 due to thrombocytopenia, on for five days and off for twenty three days in twenty eight day cycles. The patient will have a complete blood count performed on days 21 and 28 of each cycle, and a comprehensive metabolic panel performed on day 28 of each cycle. Labs may need to be performed more often. Zofran will prescribed for home use for nausea/vomiting.  We reviewed side effects of Temodar including nausea/vomiting, fatigue, cytopenias, constipation.  We recommended he wait a full week before starting the next cycle, to give some time for platelets to rebound.  Chemotherapy should be held for the following:  ANC less than 1,000  Platelets less than 100,000  LFT or creatinine greater than 2x ULN  If clinical concerns/contraindications develop  Will decrease decadron to 477mdaily now, then 53m80maily in 14 days.  Will con't Keppra 500m54mr seizure prevention.  We ask that Mark WIENEKEurn to clinic in 5 weeks with labs for evaluation prior to cycle #4.  All questions  were answered. The patient knows to call the clinic with any problems, questions or concerns. No barriers to learning were detected.  I have spent a total of 40 minutes of face-to-face and non-face-to-face time, excluding clinical staff time, preparing to see patient, ordering tests and/or medications, counseling the patient, and independently interpreting results and communicating results to the patient/family/caregiver     ZachVentura Sellers Medical Director of Neuro-Oncology [facility]esl[facility] Milford14/21 9:53 AM

## 2020-01-03 ENCOUNTER — Ambulatory Visit: Payer: Medicare Other

## 2020-01-03 ENCOUNTER — Telehealth: Payer: Self-pay | Admitting: Internal Medicine

## 2020-01-03 ENCOUNTER — Encounter: Payer: Self-pay | Admitting: Occupational Therapy

## 2020-01-03 ENCOUNTER — Ambulatory Visit: Payer: Medicare Other | Admitting: Occupational Therapy

## 2020-01-03 VITALS — BP 122/90 | HR 98

## 2020-01-03 DIAGNOSIS — M6281 Muscle weakness (generalized): Secondary | ICD-10-CM | POA: Diagnosis not present

## 2020-01-03 DIAGNOSIS — R2681 Unsteadiness on feet: Secondary | ICD-10-CM | POA: Diagnosis not present

## 2020-01-03 DIAGNOSIS — R41842 Visuospatial deficit: Secondary | ICD-10-CM | POA: Diagnosis not present

## 2020-01-03 DIAGNOSIS — H53462 Homonymous bilateral field defects, left side: Secondary | ICD-10-CM

## 2020-01-03 DIAGNOSIS — R41840 Attention and concentration deficit: Secondary | ICD-10-CM | POA: Diagnosis not present

## 2020-01-03 DIAGNOSIS — R278 Other lack of coordination: Secondary | ICD-10-CM | POA: Diagnosis not present

## 2020-01-03 DIAGNOSIS — R2689 Other abnormalities of gait and mobility: Secondary | ICD-10-CM

## 2020-01-03 NOTE — Telephone Encounter (Signed)
Scheduled per 9/14 los. Pt is aware of appt time and date.

## 2020-01-03 NOTE — Therapy (Signed)
[facility] [address] [place], [place], [address] Phone: [phone]   Fax:  [phone]  Physical Therapy Treatment  Patient Details  Name: [name] MRN: [id number] Date of Birth: May 23, 1945 Referring Provider (PT): [name]   Encounter Date: 01/03/2020   PT End of Session - 01/03/20 1325    Visit Number 16    Number of Visits 26    Date for PT Re-Evaluation 02/13/20    Authorization Type 10 V PN on 12/13/03; recert 3/97/67 (2x/wek for 4 weeks, 1x/week for 4 more weeks - total 8 weeks)    Progress Note Due on Visit 28    PT Start Time 1317    PT Stop Time 1358    PT Time Calculation (min) 41 min    Equipment Utilized During Treatment Gait belt    Activity Tolerance Patient tolerated treatment well;Patient limited by fatigue    Behavior During Therapy WFL for tasks assessed/performed           Past Medical History:  Diagnosis Date  . Anxiety   . Arthritis   . ASCVD (arteriosclerotic cardiovascular disease)    03/2004: DES x2 to the RCA; IMI with stent occlusion in 02/2005- suboptimal Plavix compliance  . CAD (coronary artery disease)   . Carcinoma of prostate ([facility])    Clopidogrel held for biopsy  . Constipation due to pain medication   . Diabetes mellitus without complication ([place])   . Diabetic neuropathy ([place])   . Headache 2021  . Hyperlipidemia   . Hypertension   . Mild depression ([place])   . Morbid obesity ([place])   . Myocardial infarction ([place])   . Shortness of breath dyspnea   . Sleep apnea    no longer uses cpap  . Stroke ([facility]) 2017  . Tobacco abuse    stopped smoking 06/28/09    Past Surgical History:  Procedure Laterality Date  . APPLICATION OF CRANIAL NAVIGATION N/A 07/04/2019   Procedure: APPLICATION OF CRANIAL NAVIGATION;  Surgeon: Ashok Pall, MD;  Location: [facility];  Service: Neurosurgery;  Laterality: N/A;  . BACK SURGERY    . CARDIAC CATHETERIZATION     X 1 stent before  having CABG  . CORONARY ARTERY BYPASS GRAFT  06/28/2008   X4  . CRANIOTOMY Right 07/04/2019   Procedure: Right occipital craniotomy for tumor with brainlab;  Surgeon: Ashok Pall, MD;  Location: [facility];  Service: Neurosurgery;  Laterality: Right;  . ENDARTERECTOMY Right 07/03/2015   Procedure: RIGHT CAROTID ENDARTERECTOMY WITH BOVINE PERICARDIUM PATCH ANGIOPLASTY;  Surgeon: Serafina Mitchell, MD;  Location: [facility];  Service: Vascular;  Laterality: Right;  . LUMBAR LAMINECTOMY/DECOMPRESSION MICRODISCECTOMY N/A 09/06/2014   Procedure: LUMBER DECOMPRESSION L3-5;  Surgeon: Melina Schools, MD;  Location: [facility];  Service: Orthopedics;  Laterality: N/A;  . LUMBAR SPINE SURGERY  2002  . PROSTATE BIOPSY    . PROSTATECTOMY  02/2007  . VASECTOMY      Vitals:   01/03/20 1330 01/03/20 1353  BP: 140/85 122/90  Pulse: 80 98     Subjective Assessment - 01/03/20 1319    Subjective Wife reports that no changes in MRI, still inflammation. Patient/wife reports that he will start chemo pill on Monday. No pain or no falls to report. Wife reports that they had stair lift finally installed.    Patient is accompained by: Family member    Pertinent History PMH: Rt occipital craniotomy 07/04/19 secondary to glioblastoma. PMH: Prostate CA, CVA 2017 (no residual  deficits), CAD, DM, HTN, hx of 2 lumbar surgeries 2002, 2016 (laminectomies)    Limitations Standing;Walking    How long can you sit comfortably? no issues    How long can you stand comfortably? 5 min    How long can you walk comfortably? 5 min    Patient Stated Goals walk better    Currently in Pain? No/denies                             [facility] Adult PT Treatment/Exercise - 01/03/20 0001      Transfers   Transfers Sit to Stand;Stand to Sit    Sit to Stand 5: Supervision    Stand to Sit 5: Supervision    Comments completed 2 x 5 reps from mat at standard chair height with UE support. rest break required between completion due to BLE  fatigue. Patient rated fatigue 6/10 after completion.       Ambulation/Gait   Ambulation/Gait Yes    Ambulation/Gait Assistance 5: Supervision    Ambulation/Gait Assistance Details completed gait training with RW x 115 ft, with focus on negotating around obstacles. One verbal cue required to avoid running into stool.    Ambulation Distance (Feet) 115 Feet    Assistive device Rolling walker    Gait Pattern Decreased stance time - left;Decreased stride length;Decreased hip/knee flexion - left;Decreased dorsiflexion - left;Antalgic;Decreased trunk rotation;Wide base of support;Poor foot clearance - left    Ambulation Surface Level;Indoor      Neuro Re-ed    Neuro Re-ed Details  seated on the green air disc completed the following: overhead press x 5 reps with 1# weighted ball, followed by forward press with BUE x 5 reps with 1# weighted ball. continued verbal cues for improved posture with completion.       Exercises   Exercises Other Exercises    Other Exercises  seated on the green air disc completed the following exercises withotu UE support: alternating marching with BLE 2 x 10 reps, patietn require rest break betwen sets due to fatigue. completed alternating LAQ 2 x 10 reps, verbal cues for technique with completion. due to fatigue patient require rest breaks between sets. completed alternating trunk rotations with weighted ball x 10 reps bilaterally to each direction with reaching to visual target. verbal cues required for improved core contraction and posture with completion.                     PT Short Term Goals - 12/19/19 1240      PT SHORT TERM GOAL #1   Title Pt will be able to perform 5x sit to stand under 30 seconds with bil UE to improve functional strength from standard chair.    Baseline Pt unable to perform them (10/24/19); unable to without UE support (11/24/19); attempted with one UE but pt only able to complete 1 rep with multiple tries; pt was able to stand up using  one UE on chair and second hand on walker (12/12/19); pt able to complete 3 reps with R UE on chair and L UE on walker but unable to complete 5 reps (12/19/19); bil UE use 42 seconds    Time 4    Period Weeks    Status Revised    Target Date 01/16/20      PT SHORT TERM GOAL #2   Title Patient will be able to ambulate for 10 min without rest with use  of his walker to improve walking endurnace    Baseline <5 min (10/24/19); 5-10 min (11/29/19)    Time 4    Period Weeks    Status Achieved    Target Date 11/21/19      PT SHORT TERM GOAL #3   Title Pt will demo 4 points improvement on Berg balance scale to improve overall static balance    Baseline 37/56 (10/24/19); 8/6 42/56    Time 4    Period Weeks    Status Achieved    Target Date 11/21/19             PT Long Term Goals - 12/19/19 1246      PT LONG TERM GOAL #1   Title Pt will demo 47/56 or better on BBS to improve balance and reduce fall risk    Baseline 37/26 (10/24/19);11/24/19 42/56; 12/19/19 47/56    Time 8    Period Weeks    Status Achieved      PT LONG TERM GOAL #2   Title Patient will be able to perform sit to stand in under 25 seconds to improve overall strength    Baseline bil UE 42 seconds (12/19/19)    Time 8    Period Weeks    Status Revised    Target Date 02/13/20      PT LONG TERM GOAL #3   Title Patient will be able to ambulate 420' with small based quad cane and SBA to improve short distance ambulation    Baseline quad cane 115' (12/19/19)    Time 8    Period Weeks    Status Revised    Target Date 02/13/20      PT LONG TERM GOAL #4   Title Pt will demo improvement by 0.15 m/s with use of st. cane to improve functional gait velocity    Baseline 0.49m/s with cane, 0.51m/s with rolling walker (eval)    Time 8    Status Revised    Target Date 02/13/20                 Plan - 01/03/20 1359    Clinical Impression Statement Today's skilled session included continued task specific sit <> stand training  from mat. Also completed BLE strengthening and core strengthening activites seated on green disc. Patient overall tolerating well, increased fatigue at times requiring intermittent rest breaks throughout. Will continue to progress toward goals.    Personal Factors and Comorbidities Comorbidity 3+    Comorbidities craniotomy secondary to glioblastoma, hx of lumbar surgeries x 2, CABG x 4    Examination-Activity Limitations Squat;Stairs;Locomotion Level;Caring for Others;Carry    Examination-Participation Restrictions Cleaning;Community Activity;Driving;Laundry;Yard Work;Shop    Stability/Clinical Decision Making Unstable/Unpredictable    Rehab Potential Good    PT Frequency Other (comment)   2x week for 4 weeks and 1x/week for 4 more weeks for total of 8 weeks.   PT Duration 8 weeks    PT Treatment/Interventions ADLs/Self Care Home Management;Gait training;Stair training;Functional mobility training;Therapeutic activities;Therapeutic exercise;Balance training;Neuromuscular re-education;Vestibular;Visual/perceptual remediation/compensation;Manual techniques;Energy conservation;Joint Manipulations;Patient/family education;Passive range of motion    PT Next Visit Plan Potentiall try gait with rollator. Seated/standing strengthening as tolerated.    PT Home Exercise Plan SLS, sit to stand without HHA, Access Code: [email or web address]: [email or web address]: 06/03/2021Prepared by: Gwenyth Bouillon PatelExercisesStanding Balance in Corner with Eyes Closed - 1 x daily - 7 x weekly - 2 sets - 10 reps    Consulted and Agree with Plan of Care Patient;Family member/caregiver  Family Member Consulted wife           Patient will benefit from skilled therapeutic intervention in order to improve the following deficits and impairments:  Abnormal gait, Decreased activity tolerance, Decreased balance, Decreased mobility, Decreased endurance, Difficulty walking, Dizziness, Impaired vision/preception  Visit  Diagnosis: Unsteadiness on feet  Muscle weakness (generalized)  Other lack of coordination  Other abnormalities of gait and mobility     Problem List Patient Active Problem List   Diagnosis Date Noted  . Glioblastoma multiforme ([facility]) 10/11/2019  . Dyslipidemia   . Seizure prophylaxis   . Chronic diastolic congestive heart failure ([place])   . Diabetic peripheral neuropathy ([facility])   . Palliative care by specialist   . Weakness 10/08/2019  . Vasogenic edema ([facility]) 10/08/2019  . Gross hematuria 10/08/2019  . DNR (do not resuscitate) discussion 07/13/2019  . S/P craniotomy 07/04/2019  . Glioblastoma with isocitrate dehydrogenase gene wildtype ([facility]) 07/04/2019  . Respiratory failure ([place])   . Encephalopathy acute   . Brain mass 07/01/2019  . Fever 07/01/2019  . Atherosclerosis of native artery of both lower extremities with intermittent claudication ([place]) 08/23/2018  . Coronary artery disease without angina pectoris 08/23/2018  . Dyspnea on exertion 08/23/2018  . Biochemically recurrent malignant neoplasm of prostate 08/16/2018  . S/P carotid endarterectomy 08/20/2015  . Essential hypertension 08/20/2015  . Type 2 diabetes mellitus with circulatory disorder ([place]) 08/20/2015  . HLD (hyperlipidemia) 08/20/2015  . Carotid artery stenosis, symptomatic 07/03/2015  . Carotid stenosis, right   . TIA (transient ischemic attack) 06/18/2015  . ASCVD (arteriosclerotic cardiovascular disease) 06/18/2015  . Diabetes mellitus without complication ([place]) 82/99/3716  . Resistant hypertension 06/18/2015  . Hyperlipidemia 06/18/2015  . Spinal stenosis of lumbar region with neurogenic claudication 09/06/2014  . Atherosclerosis of native artery of extremity with intermittent claudication ([facility]) 03/09/2013  . HYPERLIPIDEMIA 04/08/2009  . TOBACCO ABUSE 04/08/2009  . ATHEROSCLEROTIC CARDIOVASCULAR DISEASE 04/08/2009  . INTERMITTENT VERTIGO 04/08/2009  . ADENOCARCINOMA, PROSTATE 03/21/2009  .  ABDOMINAL PAIN -GENERALIZED 03/21/2009  . History of coronary artery bypass graft 06/28/2008    Jones Bales, PT, DPT 01/03/2020, 2:01 PM  [facility] [address] [place], [place], [address] Phone: [phone]   Fax:  [phone]  Name: [name] MRN: [id number] Date of Birth: 1946-01-26

## 2020-01-03 NOTE — Therapy (Signed)
[facility] [address] [place], [place], [address] Phone: [phone]   Fax:  [phone]  Occupational Therapy Treatment  Patient Details  Name: [name] MRN: [id number] Date of Birth: October 29, 1945 Referring Provider (OT): Dr. Bertis Ruddy   Encounter Date: 01/03/2020   OT End of Session - 01/03/20 1237    Visit Number 16    Number of Visits 25    Date for OT Re-Evaluation 01/23/20    Authorization Type MCR primary, AARP secondary    Authorization Time Period 90 days, may d/c after 8 weeks dep on progress.    Authorization - Visit Number 16    Authorization - Number of Visits 20    Progress Note Due on Visit 20    OT Start Time 1232    OT Stop Time 1315    OT Time Calculation (min) 43 min    Activity Tolerance Patient tolerated treatment well    Behavior During Therapy WFL for tasks assessed/performed           Past Medical History:  Diagnosis Date  . Anxiety   . Arthritis   . ASCVD (arteriosclerotic cardiovascular disease)    03/2004: DES x2 to the RCA; IMI with stent occlusion in 02/2005- suboptimal Plavix compliance  . CAD (coronary artery disease)   . Carcinoma of prostate ([facility])    Clopidogrel held for biopsy  . Constipation due to pain medication   . Diabetes mellitus without complication ([place])   . Diabetic neuropathy ([facility])   . Headache 2021  . Hyperlipidemia   . Hypertension   . Mild depression ([facility])   . Morbid obesity ([facility])   . Myocardial infarction ([facility])   . Shortness of breath dyspnea   . Sleep apnea    no longer uses cpap  . Stroke ([facility]) 2017  . Tobacco abuse    stopped smoking 06/28/09    Past Surgical History:  Procedure Laterality Date  . APPLICATION OF CRANIAL NAVIGATION N/A 07/04/2019   Procedure: APPLICATION OF CRANIAL NAVIGATION;  Surgeon: Ashok Pall, MD;  Location: [facility];  Service: Neurosurgery;  Laterality: N/A;  . BACK SURGERY    . CARDIAC CATHETERIZATION      X 1 stent before having CABG  . CORONARY ARTERY BYPASS GRAFT  06/28/2008   X4  . CRANIOTOMY Right 07/04/2019   Procedure: Right occipital craniotomy for tumor with brainlab;  Surgeon: Ashok Pall, MD;  Location: [facility];  Service: Neurosurgery;  Laterality: Right;  . ENDARTERECTOMY Right 07/03/2015   Procedure: RIGHT CAROTID ENDARTERECTOMY WITH BOVINE PERICARDIUM PATCH ANGIOPLASTY;  Surgeon: Serafina Mitchell, MD;  Location: [facility];  Service: Vascular;  Laterality: Right;  . LUMBAR LAMINECTOMY/DECOMPRESSION MICRODISCECTOMY N/A 09/06/2014   Procedure: LUMBER DECOMPRESSION L3-5;  Surgeon: Melina Schools, MD;  Location: [facility];  Service: Orthopedics;  Laterality: N/A;  . LUMBAR SPINE SURGERY  2002  . PROSTATE BIOPSY    . PROSTATECTOMY  02/2007  . VASECTOMY      There were no vitals filed for this visit.   Subjective Assessment - 01/03/20 1235    Subjective  Pt reports no pain today. Report he was asleep before his appointment so a little tired. Patient is starting chemo on Monday (5 days on, 23 days off). Currently weaning off of steroids.    Patient is accompanied by: Family member   wife   Pertinent History [name] is a 74 y.o. right-handed male with history of right occipital glioblastoma status post  debulking resection March 2021 with resultant left-sided weakness followed by chemotherapy radiation therapy followed by [name], prostate cancer with radiation therapy prostatectomy, CAD with CABG maintained on Xarelto, chronic diastolic congestive heart failure.  Lives with spouse two-level home he had been receiving outpatient therapies.  Presented 10/08/2019 with increasing left-sided weakness headache nausea vomiting as well as bouts of hematuria.   CT of the head showed new severe vasogenic edema throughout the posterior right cerebral hemisphere with 8 mm leftNo evidence of acute intracranial abnormality.  Follow-up medical oncology maintained on Decadron therapy.    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 No/denies           Treatment Visual Scanning - using Blink! Cards, patient scanned table for matching shapes and colors with 100% accuracy. Scanning to left with no difficulty this day. Patient did well with task switching to sorting by a different category.   Home safety and walker navigation in ADL kitchen. Patient worked to locate cups in cabinets. Patient required max cues for attending to the cabinet all the way to the left for identifying and locating cups.   Parquetry - Patient matched up Parquetry Butterfly pattern with moderate visual and verbal cues. Stacking with LUE of blocks and matching shapes with emphasis on scanning table top for blocks. Patient with good table top scanning with approx 95% accuracy and increased time.      OT Education - 01/03/20 1320    Education Details OT provided education on strategies for finding objets in the kitchen with bright colored paper and labels for patient during navigation and searching tasks.    Person(s) Educated Patient;Spouse    Methods Explanation    Comprehension Verbalized understanding            OT Short Term Goals - 01/03/20 1311      OT SHORT TERM GOAL #1   Title Pt to verbalize understanding of visual compensatory strategies to improve daily function including reading and environmental scanning    Time 4    Period Weeks    Status Achieved      OT SHORT TERM GOAL #2   Title Pt to perform tabletop scanning with 90% or greater accuracy using strategies    Baseline 85% for 1.5 M number cancellation    Time 4    Period Weeks    Status On-going   28/80 missed     OT SHORT TERM GOAL #3   Title Pt will read 3/4 page of text with accuracy, visual aids/AE and extra time PRN    Time 4    Period Weeks    Status Achieved   met with larger print double spaced and line guide      OT SHORT TERM GOAL #4   Title Pt will perform dressing with supervision/ set up    Baseline min A for shoes/ socks    Time 4    Period Weeks    Status On-going   ongoing, not perfroming at home     OT SHORT TERM GOAL #5   Title Pt will demonstrate improved LUE fine motor coordination for ADLs as evidenced by decreasing 9 hole peg test score to 52 secs or less.    Baseline RUE 33 secs, LUE 58.25    Time 4    Period Weeks    Status On-going  11/21/19:  68.41sec     OT SHORT TERM GOAL #6   Title Pt will demonstrate ability to retrieve a llightweight object at 130 shoulder flexion with LUE demonstrating good control.    Baseline shoulder flexion RUE 140, LUE 120    Time 4    Period Weeks    Status Achieved   11/21/19:  130*            OT Long Term Goals - 10/25/19 0747      OT LONG TERM GOAL #1   Title Pt will perform environmental scanning at 90% accuracy in mod distracting environment    Time 12    Period Weeks    Status New    Target Date 01/23/20      OT LONG TERM GOAL #2   Title Pt to read full page of text or greater with visual aids/AE prn    Time 12    Period Weeks    Status New      OT LONG TERM GOAL #3   Title Pt will demonstrate improved fine motor coordination in LUE for ADLS as evidenced by decreasing 9 hole peg test score to 48 secs or less.    Baseline Pt will perform all basic ADLS with distant supervision.    Time 12    Period Weeks    Status New      OT LONG TERM GOAL #4   Title Pt will perform basic home management tasks with supervision demonstrating good safety awareness.    Time 12    Period Weeks    Status New                 Plan - 01/03/20 1321    Clinical Impression Statement Increased fatigue noted today with ambulation and walker negotiation. Pt engaged in activities tagreting visual table top scanning and environmental scanning in a functional setting.    Occupational performance deficits (Please refer to evaluation for details):  IADL's;Work;Leisure;Social Participation;ADL's    Body Structure / Function / Physical Skills IADL;Endurance;Balance;Vision;ADL;Mobility;Strength;UE functional use;FMC;Coordination;Gait;GMC;ROM;Decreased knowledge of use of DME;Dexterity    Cognitive Skills Memory;Problem Solve;Perception;Thought;Safety Awareness    Comorbidities impacting occupational performance description: glioblastoma - pt has begun radiation    OT Frequency 2x / week    OT Duration 12 weeks    OT Treatment/Interventions Self-care/ADL training;Functional Mobility Training;Therapeutic activities;Coping strategies training;Visual/perceptual remediation/compensation;Patient/family education;Cognitive remediation/compensation;Therapeutic exercise;Balance training;Manual Therapy;Neuromuscular education;Aquatic Therapy;Energy conservation;DME and/or AE instruction;Paraffin;Fluidtherapy;Gait Training;Passive range of motion;Moist Heat    Plan practice socks and shoes, table top scanning (elimination activity)    Consulted and Agree with Plan of Care Patient;Family member/caregiver    Family Member Consulted wife           Patient will benefit from skilled therapeutic intervention in order to improve the following deficits and impairments:   Body Structure / Function / Physical Skills: IADL, Endurance, Balance, Vision, ADL, Mobility, Strength, UE functional use, FMC, Coordination, Gait, GMC, ROM, Decreased knowledge of use of DME, Dexterity Cognitive Skills: Memory, Problem Solve, Perception, Thought, Safety Awareness     Visit Diagnosis: Unsteadiness on feet  Muscle weakness (generalized)  Homonymous hemianopsia, left  Visuospatial deficit  Other lack of coordination    Problem List Patient Active Problem List   Diagnosis Date Noted  . Glioblastoma multiforme ([facility]) 10/11/2019  . Dyslipidemia   . Seizure prophylaxis   . Chronic diastolic congestive heart failure ([place])   . Diabetic peripheral neuropathy ([place])    . Palliative care by specialist   .  Weakness 10/08/2019  . Vasogenic edema ([facility]) 10/08/2019  . Gross hematuria 10/08/2019  . DNR (do not resuscitate) discussion 07/13/2019  . S/P craniotomy 07/04/2019  . Glioblastoma with isocitrate dehydrogenase gene wildtype ([place]) 07/04/2019  . Respiratory failure ([facility] [facility])   . Encephalopathy acute   . Brain mass 07/01/2019  . Fever 07/01/2019  . Atherosclerosis of native artery of both lower extremities with intermittent claudication ([facility]) 08/23/2018  . Coronary artery disease without angina pectoris 08/23/2018  . Dyspnea on exertion 08/23/2018  . Biochemically recurrent malignant neoplasm of prostate 08/16/2018  . S/P carotid endarterectomy 08/20/2015  . Essential hypertension 08/20/2015  . Type 2 diabetes mellitus with circulatory disorder ([place]) 08/20/2015  . HLD (hyperlipidemia) 08/20/2015  . Carotid artery stenosis, symptomatic 07/03/2015  . Carotid stenosis, right   . TIA (transient ischemic attack) 06/18/2015  . ASCVD (arteriosclerotic cardiovascular disease) 06/18/2015  . Diabetes mellitus without complication ([facility]) 10/93/2355  . Resistant hypertension 06/18/2015  . Hyperlipidemia 06/18/2015  . Spinal stenosis of lumbar region with neurogenic claudication 09/06/2014  . Atherosclerosis of native artery of extremity with intermittent claudication ([place]) 03/09/2013  . HYPERLIPIDEMIA 04/08/2009  . TOBACCO ABUSE 04/08/2009  . ATHEROSCLEROTIC CARDIOVASCULAR DISEASE 04/08/2009  . INTERMITTENT VERTIGO 04/08/2009  . ADENOCARCINOMA, PROSTATE 03/21/2009  . ABDOMINAL PAIN -GENERALIZED 03/21/2009  . History of coronary artery bypass graft 06/28/2008    Zachery Conch MOT, OTR/L  01/03/2020, 1:23 PM  [facility] [address] [place], [place], [address] Phone: [phone]   Fax:  [phone]  Name: [name] MRN: [id number] Date of Birth: 03-20-1946

## 2020-01-04 ENCOUNTER — Telehealth: Payer: Self-pay | Admitting: *Deleted

## 2020-01-04 MED ORDER — LEVETIRACETAM 500 MG PO TABS: 1000.0000 mg | ORAL_TABLET | Freq: Two times a day (BID) | ORAL | 3 refills | Status: DC

## 2020-01-04 NOTE — Telephone Encounter (Signed)
Patients wife called to advise that patient had a seizure, was laying in his recliner,  Head was turned to side, eyes open, moaning.  She was able to get the patient to respond.  Recovery post this incident was brief.  She stated he had some mild slurring immediately after but he also didn't have his bottom denture plate in which may have contributed to change in speech.    Upon return call to patient and wife, walked through ruling out stroke. Denies facial drooping, arm weakness, and/or speech difficulties.    Wife states has not missed any Keppra,  Denies any secondary illnesses (fever, uti, additional stress).  Only change has been decrease in Decadron started Tuesday of this week.  Decadron 4mg  BID to Decadron 4 mg daily.  Per [name] stay at Decadron 4 mg once daily and increase Keppra to 1000 mg BID.

## 2020-01-05 ENCOUNTER — Other Ambulatory Visit: Payer: Self-pay

## 2020-01-05 ENCOUNTER — Encounter: Payer: Self-pay | Admitting: Physical Therapy

## 2020-01-05 ENCOUNTER — Ambulatory Visit: Payer: Medicare Other | Admitting: Occupational Therapy

## 2020-01-05 ENCOUNTER — Encounter: Payer: Self-pay | Admitting: Occupational Therapy

## 2020-01-05 ENCOUNTER — Ambulatory Visit: Payer: Medicare Other | Admitting: Physical Therapy

## 2020-01-05 DIAGNOSIS — R2689 Other abnormalities of gait and mobility: Secondary | ICD-10-CM

## 2020-01-05 DIAGNOSIS — M6281 Muscle weakness (generalized): Secondary | ICD-10-CM

## 2020-01-05 DIAGNOSIS — R41842 Visuospatial deficit: Secondary | ICD-10-CM

## 2020-01-05 DIAGNOSIS — R278 Other lack of coordination: Secondary | ICD-10-CM | POA: Diagnosis not present

## 2020-01-05 DIAGNOSIS — R2681 Unsteadiness on feet: Secondary | ICD-10-CM | POA: Diagnosis not present

## 2020-01-05 DIAGNOSIS — H53462 Homonymous bilateral field defects, left side: Secondary | ICD-10-CM

## 2020-01-05 DIAGNOSIS — R41840 Attention and concentration deficit: Secondary | ICD-10-CM | POA: Diagnosis not present

## 2020-01-05 MED FILL — TEMOZOLOMIDE 180 MG CAPS: 180 | 28 days supply | Qty: 5 | Fill #0

## 2020-01-05 MED FILL — TEMOZOLOMIDE 140 MG CAPS: 140 | 28 days supply | Qty: 5 | Fill #0

## 2020-01-05 NOTE — Therapy (Addendum)
[facility] [address] [place], [place], [address] Phone: [phone]   Fax:  [phone]  Physical Therapy Treatment  Patient Details  Name: [name] MRN: [id number] Date of Birth: 04/17/1946 Referring Provider (PT): [name]   Encounter Date: 01/05/2020   PT End of Session - 01/05/20 1456    Visit Number 17    Number of Visits 26    Date for PT Re-Evaluation 02/13/20    Authorization Type 10 V PN on 8/75/64; recert 3/32/95 (2x/wek for 4 weeks, 1x/week for 4 more weeks - total 8 weeks)    Progress Note Due on Visit 28    PT Start Time 1448    PT Stop Time 1528    PT Time Calculation (min) 40 min    Equipment Utilized During Treatment Gait belt    Activity Tolerance Patient tolerated treatment well;Patient limited by fatigue    Behavior During Therapy WFL for tasks assessed/performed           Past Medical History:  Diagnosis Date  . Anxiety   . Arthritis   . ASCVD (arteriosclerotic cardiovascular disease)    03/2004: DES x2 to the RCA; IMI with stent occlusion in 02/2005- suboptimal Plavix compliance  . CAD (coronary artery disease)   . Carcinoma of prostate ([place])    Clopidogrel held for biopsy  . Constipation due to pain medication   . Diabetes mellitus without complication ([place])   . Diabetic neuropathy ([place])   . Headache 2021  . Hyperlipidemia   . Hypertension   . Mild depression ([place])   . Morbid obesity ([place])   . Myocardial infarction ([place])   . Shortness of breath dyspnea   . Sleep apnea    no longer uses cpap  . Stroke ([place]) 2017  . Tobacco abuse    stopped smoking 06/28/09    Past Surgical History:  Procedure Laterality Date  . APPLICATION OF CRANIAL NAVIGATION N/A 07/04/2019   Procedure: APPLICATION OF CRANIAL NAVIGATION;  Surgeon: Ashok Pall, MD;  Location: [facility];  Service: Neurosurgery;  Laterality: N/A;  . BACK SURGERY    . CARDIAC CATHETERIZATION     X 1 stent before  having CABG  . CORONARY ARTERY BYPASS GRAFT  06/28/2008   X4  . CRANIOTOMY Right 07/04/2019   Procedure: Right occipital craniotomy for tumor with brainlab;  Surgeon: Ashok Pall, MD;  Location: [facility];  Service: Neurosurgery;  Laterality: Right;  . ENDARTERECTOMY Right 07/03/2015   Procedure: RIGHT CAROTID ENDARTERECTOMY WITH BOVINE PERICARDIUM PATCH ANGIOPLASTY;  Surgeon: Serafina Mitchell, MD;  Location: [facility];  Service: Vascular;  Laterality: Right;  . LUMBAR LAMINECTOMY/DECOMPRESSION MICRODISCECTOMY N/A 09/06/2014   Procedure: LUMBER DECOMPRESSION L3-5;  Surgeon: Melina Schools, MD;  Location: [facility];  Service: Orthopedics;  Laterality: N/A;  . LUMBAR SPINE SURGERY  2002  . PROSTATE BIOPSY    . PROSTATECTOMY  02/2007  . VASECTOMY      There were no vitals filed for this visit.   Subjective Assessment - 01/05/20 1452    Subjective Had a seizure yesterday, first one since April. MD increased his Keppra to 1000mg  2x day and kept the Decadron at 4mg  in the morning. See's the MD again in October. Starting chemo meds on Monday for 3rd round ([facility]). Since the seizure pt's spouse has noticed his left foot dragging more and increased time/effort with mobiity. Pt reports fatigue. OT noted and reported increased dificulty with transitions and motor planning with turns.  Patient is accompained by: Family member    Pertinent History PMH: Rt occipital craniotomy 07/04/19 secondary to glioblastoma. PMH: Prostate CA, CVA 2017 (no residual deficits), CAD, DM, HTN, hx of 2 lumbar surgeries 2002, 2016 (laminectomies)    Limitations Standing;Walking    How long can you sit comfortably? no issues    How long can you stand comfortably? 5 min    How long can you walk comfortably? 5 min    Patient Stated Goals walk better    Currently in Pain? No/denies               [facility] Adult PT Treatment/Exercise - 01/05/20 1726      Transfers   Transfers Sit to Stand;Stand to Sit    Sit to Stand 4: Min  guard;With upper extremity assist;From bed;From chair/3-in-1;4: Min assist    Sit to Stand Details Verbal cues for technique;Verbal cues for sequencing;Verbal cues for safe use of DME/AE    Sit to Stand Details (indicate cue type and reason) cues to scoot to edge and for increased ant weight shifting    Stand to Sit 4: Min assist;With upper extremity assist;To bed;To chair/3-in-1;Uncontrolled descent    Stand to Sit Details (indicate cue type and reason) Verbal cues for sequencing;Verbal cues for precautions/safety;Verbal cues for safe use of DME/AE    Stand to Sit Details cues to use arms to assist with controlled descent      Ambulation/Gait   Ambulation/Gait Yes    Ambulation/Gait Assistance 5: Supervision;4: Min guard    Ambulation/Gait Assistance Details couple instances of lateral instability with gait due to left foot catching with gait needing assist to correct.     Ambulation Distance (Feet) 80 Feet   x2   Assistive device Rolling walker    Gait Pattern Decreased stance time - left;Decreased stride length;Decreased hip/knee flexion - left;Decreased dorsiflexion - left;Antalgic;Decreased trunk rotation;Wide base of support;Poor foot clearance - left    Ambulation Surface Level;Indoor      Exercises   Exercises Other Exercises    Other Exercises  revised HEP.           issued the following to HEP today. Cues needed on form and technique. Refer to [facility] for full details.    Access Code: [email or web address] URL: [email or web address] Date: 01/05/2020 Prepared by: Willow Ora  Exercises Sit to Stand - 1 x daily - 5 x weekly - 1 sets - 5-10 reps Heel rises with counter support - 1 x daily - 5 x weekly - 1 sets - 10 reps Standing March with Counter Support - 1 x daily - 5 x weekly - 1 sets - 10 reps Mini Squat with Counter Support - 1 x daily - 5 x weekly - 1 sets - 5-10 reps Wide Stance with Counter Support - 1 x daily - 5 x weekly - 1 sets - 3 reps - 30 hold      01/05/20 1711  PT Education  Education Details updated HEP  Person(s) Educated Patient;Spouse  Methods Explanation;Demonstration;Verbal cues;Handout;Tactile cues  Comprehension Verbalized understanding;Returned demonstration;Verbal cues required;Need further instruction        PT Short Term Goals - 12/19/19 1240      PT SHORT TERM GOAL #1   Title Pt will be able to perform 5x sit to stand under 30 seconds with bil UE to improve functional strength from standard chair.    Baseline Pt unable to perform them (10/24/19); unable to without UE support (11/24/19); attempted with one UE but  pt only able to complete 1 rep with multiple tries; pt was able to stand up using one UE on chair and second hand on walker (12/12/19); pt able to complete 3 reps with R UE on chair and L UE on walker but unable to complete 5 reps (12/19/19); bil UE use 42 seconds    Time 4    Period Weeks    Status Revised    Target Date 01/16/20      PT SHORT TERM GOAL #2   Title Patient will be able to ambulate for 10 min without rest with use of his walker to improve walking endurnace    Baseline <5 min (10/24/19); 5-10 min (11/29/19)    Time 4    Period Weeks    Status Achieved    Target Date 11/21/19      PT SHORT TERM GOAL #3   Title Pt will demo 4 points improvement on Berg balance scale to improve overall static balance    Baseline 37/56 (10/24/19); 8/6 42/56    Time 4    Period Weeks    Status Achieved    Target Date 11/21/19             PT Long Term Goals - 12/19/19 1246      PT LONG TERM GOAL #1   Title Pt will demo 47/56 or better on BBS to improve balance and reduce fall risk    Baseline 37/26 (10/24/19);11/24/19 42/56; 12/19/19 47/56    Time 8    Period Weeks    Status Achieved      PT LONG TERM GOAL #2   Title Patient will be able to perform sit to stand in under 25 seconds to improve overall strength    Baseline bil UE 42 seconds (12/19/19)    Time 8    Period Weeks    Status Revised    Target Date  02/13/20      PT LONG TERM GOAL #3   Title Patient will be able to ambulate 420' with small based quad cane and SBA to improve short distance ambulation    Baseline quad cane 115' (12/19/19)    Time 8    Period Weeks    Status Revised    Target Date 02/13/20      PT LONG TERM GOAL #4   Title Pt will demo improvement by 0.15 m/s with use of st. cane to improve functional gait velocity    Baseline 0.34m/s with cane, 0.80m/s with rolling walker (eval)    Time 8    Status Revised    Target Date 02/13/20              01/05/20 1456  Plan  Clinical Impression Statement Today's skilled session continued to focus on gait safety with RW and revisment of pt's HEP. Rest breaks taken with session as pt fatigues quickly. No other issues noted or reported in session. The pt is progressing toward goals and should benefit from continued PT to progress toward unmet goals.  Personal Factors and Comorbidities Comorbidity 3+  Comorbidities craniotomy secondary to glioblastoma, hx of lumbar surgeries x 2, CABG x 4  Examination-Activity Limitations Squat;Stairs;Locomotion Level;Caring for Others;Carry  Examination-Participation Restrictions Cleaning;Community Activity;Driving;Laundry;Yard Work;Shop  Pt will benefit from skilled therapeutic intervention in order to improve on the following deficits Abnormal gait;Decreased activity tolerance;Decreased balance;Decreased mobility;Decreased endurance;Difficulty walking;Dizziness;Impaired vision/preception  Stability/Clinical Decision Making Unstable/Unpredictable  Rehab Potential Good  PT Frequency Other (comment) (2x week for 4 weeks and 1x/week  for 4 more weeks for total of 8 weeks.)  PT Duration 8 weeks  PT Treatment/Interventions ADLs/Self Care Home Management;Gait training;Stair training;Functional mobility training;Therapeutic activities;Therapeutic exercise;Balance training;Neuromuscular re-education;Vestibular;Visual/perceptual  remediation/compensation;Manual techniques;Energy conservation;Joint Manipulations;Patient/family education;Passive range of motion  PT Next Visit Plan Potentiall try gait with rollator. Seated/standing strengthening as tolerated.  PT Home Exercise Plan Access Code: [email or web address]  Consulted and Agree with Plan of Care Patient;Family member/caregiver  Family Member Consulted wife         Patient will benefit from skilled therapeutic intervention in order to improve the following deficits and impairments:  Abnormal gait, Decreased activity tolerance, Decreased balance, Decreased mobility, Decreased endurance, Difficulty walking, Dizziness, Impaired vision/preception  Visit Diagnosis: Unsteadiness on feet  Muscle weakness (generalized)  Other abnormalities of gait and mobility     Problem List Patient Active Problem List   Diagnosis Date Noted  . 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 Dyslipidemia   . Seizure prophylaxis   . Chronic diastolic congestive heart failure ([place])   . Diabetic peripheral neuropathy ([place])   . Palliative care by specialist   . Weakness 10/08/2019  . Vasogenic edema ([place]) 10/08/2019  . Gross hematuria 10/08/2019  . DNR (do not resuscitate) discussion 07/13/2019  . S/P craniotomy 07/04/2019  . 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 Respiratory failure ([place])   . Encephalopathy acute   . Brain mass 07/01/2019  . Fever 07/01/2019  . Atherosclerosis of native artery of both lower extremities with intermittent claudication ([place]) 08/23/2018  . Coronary artery disease without angina pectoris 08/23/2018  . Dyspnea on exertion 08/23/2018  . Biochemically recurrent malignant neoplasm of prostate 08/16/2018  . S/P carotid endarterectomy 08/20/2015  . Essential hypertension 08/20/2015  . 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 HLD (hyperlipidemia) 08/20/2015  . Carotid artery stenosis,  symptomatic 07/03/2015  . Carotid stenosis, right   . TIA (transient ischemic attack) 06/18/2015  . ASCVD (arteriosclerotic cardiovascular disease) 06/18/2015  . Diabetes mellitus without complication ([place]) 81/15/7262  . Resistant hypertension 06/18/2015  . Hyperlipidemia 06/18/2015  . Spinal stenosis of lumbar region with neurogenic claudication 09/06/2014  . Atherosclerosis of native artery of extremity with intermittent claudication ([place]) 03/09/2013  . HYPERLIPIDEMIA 04/08/2009  . TOBACCO ABUSE 04/08/2009  . ATHEROSCLEROTIC CARDIOVASCULAR DISEASE 04/08/2009  . INTERMITTENT VERTIGO 04/08/2009  . ADENOCARCINOMA, PROSTATE 03/21/2009  . ABDOMINAL PAIN -GENERALIZED 03/21/2009  . History of coronary artery bypass graft 06/28/2008    Willow Ora, PTA, [facility] [address] [place], [place] [address] [phone] 01/05/20, 5:28 PM   Name: BURRELL HODAPP MRN: [id number] Date of Birth: 20-Mar-1946

## 2020-01-05 NOTE — Therapy (Signed)
[facility] [address] [place], [place], [address] Phone: [phone]   Fax:  [phone]  Occupational Therapy Treatment  Patient Details  Name: [name] MRN: [id number] Date of Birth: [birth year] Referring Provider (OT): Dr. Bertis Ruddy   Encounter Date: 01/05/2020   OT End of Session - 01/05/20 1417    Visit Number 17    Number of Visits 25    Date for OT Re-Evaluation 01/23/20    Authorization Type MCR primary, AARP secondary    Authorization Time Period 90 days, may d/c after 8 weeks dep on progress.    Authorization - Visit Number 17    Authorization - Number of Visits 20    Progress Note Due on Visit 20    OT Start Time 1402    OT Stop Time 1445    OT Time Calculation (min) 43 min    Activity Tolerance Patient tolerated treatment well    Behavior During Therapy WFL for tasks assessed/performed           Past Medical History:  Diagnosis Date  . Anxiety   . Arthritis   . ASCVD (arteriosclerotic cardiovascular disease)    03/2004: DES x2 to the RCA; IMI with stent occlusion in 02/2005- suboptimal Plavix compliance  . CAD (coronary artery disease)   . Carcinoma of prostate ([place])    Clopidogrel held for biopsy  . Constipation due to pain medication   . Diabetes mellitus without complication ([place])   . Diabetic neuropathy ([place])   . Headache 2021  . Hyperlipidemia   . Hypertension   . Mild depression ([place])   . Morbid obesity ([place])   . Myocardial infarction ([place])   . Shortness of breath dyspnea   . Sleep apnea    no longer uses cpap  . Stroke ([facility]) 2017  . Tobacco abuse    stopped smoking 06/28/09    Past Surgical History:  Procedure Laterality Date  . APPLICATION OF CRANIAL NAVIGATION N/A 07/04/2019   Procedure: APPLICATION OF CRANIAL NAVIGATION;  Surgeon: Ashok Pall, MD;  Location: [facility];  Service: Neurosurgery;  Laterality: N/A;  . BACK SURGERY    . CARDIAC CATHETERIZATION      X 1 stent before having CABG  . CORONARY ARTERY BYPASS GRAFT  06/28/2008   X4  . CRANIOTOMY Right 07/04/2019   Procedure: Right occipital craniotomy for tumor with brainlab;  Surgeon: Ashok Pall, MD;  Location: [facility];  Service: Neurosurgery;  Laterality: Right;  . ENDARTERECTOMY Right 07/03/2015   Procedure: RIGHT CAROTID ENDARTERECTOMY WITH BOVINE PERICARDIUM PATCH ANGIOPLASTY;  Surgeon: Serafina Mitchell, MD;  Location: [facility];  Service: Vascular;  Laterality: Right;  . LUMBAR LAMINECTOMY/DECOMPRESSION MICRODISCECTOMY N/A 09/06/2014   Procedure: LUMBER DECOMPRESSION L3-5;  Surgeon: Melina Schools, MD;  Location: [facility];  Service: Orthopedics;  Laterality: N/A;  . LUMBAR SPINE SURGERY  2002  . PROSTATE BIOPSY    . PROSTATECTOMY  02/2007  . VASECTOMY      There were no vitals filed for this visit.   Subjective Assessment - 01/05/20 1405    Subjective  Pt's spouse reports possible seizure last night. Medication changes as of yesterday. Pt reports "I feel pretty good". Reports "no dizziness, no pain".    Patient is accompanied by: Family member   wife   Pertinent History [name] is a 74 y.o. 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, prostate cancer with radiation therapy prostatectomy, CAD with CABG maintained on Xarelto, chronic diastolic congestive heart failure.  Lives with spouse two-level home he had been receiving outpatient therapies.  Presented 10/08/2019 with increasing left-sided weakness headache nausea vomiting as well as bouts of hematuria.   CT of the head showed new severe vasogenic edema throughout the posterior right cerebral hemisphere with 8 mm leftNo evidence of acute intracranial abnormality.  Follow-up medical oncology maintained on Decadron therapy.    Limitations Lt homonymous hemianopsia - no driving     Special Tests Pt goes by "[name]"    Patient Stated Goals maximize independence    Currently in Pain? No/denies                OT Treatments/Exercises (OP) - 01/05/20 1410      Transfers   Transfers Sit to Stand;Stand to Sit    Sit to Stand 4: Min guard   Moderate verbal cues and increased processing time.   Stand to Sit 4: Min assist    Stand to Sit Details --   required increased processing time and organization and sequ     Exercises   Exercises --      Visual/Perceptual Exercises   Pegboard large peg board. eliminated whole left column with filling board. more complex pattern and required moderate cues for following pattern    Other Trail Making Test Part A with increased time but min assistance with finding numbers on left    Scanning Tabletop    Scanning - Tabletop number cancellation task 1 error    Draw a Clock moderate difficulty with organization of drawing but attended to both hemispheres      Fine Motor Coordination (Hand/Wrist)   Fine Motor Coordination Large Pegboard    Large Pegboard min drops w LUE. Bilateral Coordination with simultaneous pegs required max verbal cues                   OT Education - 01/05/20 1455    Education Details OT provided education on strategies for finding objets in the kitchen with bright colored paper and labels for patient during navigation and searching tasks.    Person(s) Educated Patient;Spouse    Methods Explanation    Comprehension Verbalized understanding            OT Short Term Goals - 01/03/20 1311      OT SHORT TERM GOAL #1   Title Pt to verbalize understanding of visual compensatory strategies to improve daily function including reading and environmental scanning    Time 4    Period Weeks    Status Achieved      OT SHORT TERM GOAL #2   Title Pt to perform tabletop scanning with 90% or greater accuracy using strategies    Baseline 85% for 1.5 M number cancellation    Time 4    Period Weeks     Status On-going   28/80 missed     OT SHORT TERM GOAL #3   Title Pt will read 3/4 page of text with accuracy, visual aids/AE and extra time PRN    Time 4    Period Weeks    Status Achieved   met with larger print double spaced and line guide     OT SHORT TERM GOAL #4   Title Pt will perform dressing with supervision/ set up    Baseline min A for shoes/ socks    Time 4  Period Weeks    Status On-going   ongoing, not perfroming at home     OT SHORT TERM GOAL #5   Title Pt will demonstrate improved LUE fine motor coordination for ADLs as evidenced by decreasing 9 hole peg test score to 52 secs or less.    Baseline RUE 33 secs, LUE 58.25    Time 4    Period Weeks    Status On-going   11/21/19:  68.41sec     OT SHORT TERM GOAL #6   Title Pt will demonstrate ability to retrieve a llightweight object at 130 shoulder flexion with LUE demonstrating good control.    Baseline shoulder flexion RUE 140, LUE 120    Time 4    Period Weeks    Status Achieved   11/21/19:  130*            OT Long Term Goals - 10/25/19 0747      OT LONG TERM GOAL #1   Title Pt will perform environmental scanning at 90% accuracy in mod distracting environment    Time 12    Period Weeks    Status New    Target Date 01/23/20      OT LONG TERM GOAL #2   Title Pt to read full page of text or greater with visual aids/AE prn    Time 12    Period Weeks    Status New      OT LONG TERM GOAL #3   Title Pt will demonstrate improved fine motor coordination in LUE for ADLS as evidenced by decreasing 9 hole peg test score to 48 secs or less.    Baseline Pt will perform all basic ADLS with distant supervision.    Time 12    Period Weeks    Status New      OT LONG TERM GOAL #4   Title Pt will perform basic home management tasks with supervision demonstrating good safety awareness.    Time 12    Period Weeks    Status New                 Plan - 01/05/20 1419    Clinical Impression Statement Patient  had reported seizure last night. Since last session, patient has demonstrated decrease in processing speed and sequencing and overall cognitive ability. He also has declined in his gait and mobility since last session.    Occupational performance deficits (Please refer to evaluation for details): IADL's;Work;Leisure;Social Participation;ADL's    Body Structure / Function / Physical Skills IADL;Endurance;Balance;Vision;ADL;Mobility;Strength;UE functional use;FMC;Coordination;Gait;GMC;ROM;Decreased knowledge of use of DME;Dexterity    Cognitive Skills Memory;Problem Solve;Perception;Thought;Safety Awareness    Comorbidities impacting occupational performance description: glioblastoma - pt has begun radiation    OT Frequency 2x / week    OT Duration 12 weeks    OT Treatment/Interventions Self-care/ADL training;Functional Mobility Training;Therapeutic activities;Coping strategies training;Visual/perceptual remediation/compensation;Patient/family education;Cognitive remediation/compensation;Therapeutic exercise;Balance training;Manual Therapy;Neuromuscular education;Aquatic Therapy;Energy conservation;DME and/or AE instruction;Paraffin;Fluidtherapy;Gait Training;Passive range of motion;Moist Heat    Plan assess more cognitive skills and processing, socks and shoes    Consulted and Agree with Plan of Care Patient;Family member/caregiver    Family Member Consulted wife           Patient will benefit from skilled therapeutic intervention in order to improve the following deficits and impairments:   Body Structure / Function / Physical Skills: IADL, Endurance, Balance, Vision, ADL, Mobility, Strength, UE functional use, FMC, Coordination, Gait, GMC, ROM, Decreased knowledge of use of DME, Dexterity Cognitive  Skills: Memory, Problem Solve, Perception, Thought, Safety Awareness     Visit Diagnosis: Unsteadiness on feet  Muscle weakness (generalized)  Other lack of coordination  Homonymous  hemianopsia, left  Visuospatial deficit  Other abnormalities of gait and mobility    Problem List Patient Active Problem List   Diagnosis Date Noted  . Glioblastoma multiforme ([facility]) 10/11/2019  . Dyslipidemia   . Seizure prophylaxis   . Chronic diastolic congestive heart failure ([facility])   . Diabetic peripheral neuropathy ([facility])   . Palliative care by specialist   . Weakness 10/08/2019  . Vasogenic edema ([facility]) 10/08/2019  . Gross hematuria 10/08/2019  . DNR (do not resuscitate) discussion 07/13/2019  . S/P craniotomy 07/04/2019  . Glioblastoma with isocitrate dehydrogenase gene wildtype ([facility]) 07/04/2019  . Respiratory failure ([facility])   . Encephalopathy acute   . Brain mass 07/01/2019  . Fever 07/01/2019  . Atherosclerosis of native artery of both lower extremities with intermittent claudication ([facility]) 08/23/2018  . Coronary artery disease without angina pectoris 08/23/2018  . Dyspnea on exertion 08/23/2018  . Biochemically recurrent malignant neoplasm of prostate 08/16/2018  . S/P carotid endarterectomy 08/20/2015  . Essential hypertension 08/20/2015  . Type 2 diabetes mellitus with circulatory disorder ([facility]) 08/20/2015  . HLD (hyperlipidemia) 08/20/2015  . Carotid artery stenosis, symptomatic 07/03/2015  . Carotid stenosis, right   . TIA (transient ischemic attack) 06/18/2015  . ASCVD (arteriosclerotic cardiovascular disease) 06/18/2015  . Diabetes mellitus without complication ([facility]) 42/71/5664  . Resistant hypertension 06/18/2015  . Hyperlipidemia 06/18/2015  . Spinal stenosis of lumbar region with neurogenic claudication 09/06/2014  . Atherosclerosis of native artery of extremity with intermittent claudication ([facility]) 03/09/2013  . HYPERLIPIDEMIA 04/08/2009  . TOBACCO ABUSE 04/08/2009  . ATHEROSCLEROTIC CARDIOVASCULAR DISEASE 04/08/2009  . INTERMITTENT VERTIGO 04/08/2009  . ADENOCARCINOMA, PROSTATE 03/21/2009  . ABDOMINAL PAIN -GENERALIZED 03/21/2009  . History of coronary  artery bypass graft 06/28/2008    Zachery Conch MOT, OTR/L  01/05/2020, 2:59 PM  [facility] [address] [place], [place], [address] Phone: [phone]   Fax:  [phone]  Name: [name] MRN: [id number] Date of Birth: November 21, 1945

## 2020-01-05 NOTE — Patient Instructions (Signed)
Access Code: [email or web address] URL: [email or web address] Date: 01/05/2020 Prepared by: Willow Ora  Exercises Sit to Stand - 1 x daily - 5 x weekly - 1 sets - 5-10 reps Heel rises with counter support - 1 x daily - 5 x weekly - 1 sets - 10 reps Standing March with Counter Support - 1 x daily - 5 x weekly - 1 sets - 10 reps Mini Squat with Counter Support - 1 x daily - 5 x weekly - 1 sets - 5-10 reps Wide Stance with Counter Support - 1 x daily - 5 x weekly - 1 sets - 3 reps - 30 hold

## 2020-01-09 ENCOUNTER — Encounter: Payer: Self-pay | Admitting: Occupational Therapy

## 2020-01-09 ENCOUNTER — Other Ambulatory Visit: Payer: Self-pay

## 2020-01-09 ENCOUNTER — Ambulatory Visit: Payer: Medicare Other | Admitting: Occupational Therapy

## 2020-01-09 ENCOUNTER — Ambulatory Visit: Payer: Medicare Other | Admitting: Physical Therapy

## 2020-01-09 ENCOUNTER — Ambulatory Visit: Payer: Medicare Other

## 2020-01-09 DIAGNOSIS — R2681 Unsteadiness on feet: Secondary | ICD-10-CM | POA: Diagnosis not present

## 2020-01-09 DIAGNOSIS — R41840 Attention and concentration deficit: Secondary | ICD-10-CM | POA: Diagnosis not present

## 2020-01-09 DIAGNOSIS — G9340 Encephalopathy, unspecified: Secondary | ICD-10-CM

## 2020-01-09 DIAGNOSIS — M6281 Muscle weakness (generalized): Secondary | ICD-10-CM

## 2020-01-09 DIAGNOSIS — R2689 Other abnormalities of gait and mobility: Secondary | ICD-10-CM

## 2020-01-09 DIAGNOSIS — R278 Other lack of coordination: Secondary | ICD-10-CM | POA: Diagnosis not present

## 2020-01-09 DIAGNOSIS — C719 Malignant neoplasm of brain, unspecified: Secondary | ICD-10-CM

## 2020-01-09 DIAGNOSIS — R41842 Visuospatial deficit: Secondary | ICD-10-CM | POA: Diagnosis not present

## 2020-01-09 DIAGNOSIS — H53462 Homonymous bilateral field defects, left side: Secondary | ICD-10-CM

## 2020-01-09 DIAGNOSIS — R42 Dizziness and giddiness: Secondary | ICD-10-CM

## 2020-01-09 NOTE — Therapy (Signed)
[facility] [address] [place], [place], [address] Phone: [phone]   Fax:  [phone]  Occupational Therapy Treatment  Patient Details  Name: [name] MRN: [id number] Date of Birth: 1945/06/23 Referring Provider (OT): Dr. Bertis Ruddy   Encounter Date: 01/09/2020   OT End of Session - 01/09/20 1355    Visit Number 18    Number of Visits 25    Date for OT Re-Evaluation 01/23/20    Authorization Type MCR primary, AARP secondary    Authorization Time Period 90 days, may d/c after 8 weeks dep on progress.    Authorization - Visit Number 18    Authorization - Number of Visits 20    Progress Note Due on Visit 20    OT Start Time 1318    OT Stop Time 1401    OT Time Calculation (min) 43 min    Activity Tolerance Patient tolerated treatment well    Behavior During Therapy WFL for tasks assessed/performed           Past Medical History:  Diagnosis Date  . Anxiety   . Arthritis   . ASCVD (arteriosclerotic cardiovascular disease)    03/2004: DES x2 to the RCA; IMI with stent occlusion in 02/2005- suboptimal Plavix compliance  . CAD (coronary artery disease)   . Carcinoma of prostate ([place])    Clopidogrel held for biopsy  . Constipation due to pain medication   . Diabetes mellitus without complication ([facility])   . Diabetic neuropathy ([facility])   . Headache 2021  . Hyperlipidemia   . Hypertension   . Mild depression ([facility])   . Morbid obesity ([facility])   . Myocardial infarction ([facility])   . Shortness of breath dyspnea   . Sleep apnea    no longer uses cpap  . Stroke ([facility]) 2017  . Tobacco abuse    stopped smoking 06/28/09    Past Surgical History:  Procedure Laterality Date  . APPLICATION OF CRANIAL NAVIGATION N/A 07/04/2019   Procedure: APPLICATION OF CRANIAL NAVIGATION;  Surgeon: Ashok Pall, MD;  Location: [facility];  Service: Neurosurgery;  Laterality: N/A;  . BACK SURGERY    . CARDIAC CATHETERIZATION      X 1 stent before having CABG  . CORONARY ARTERY BYPASS GRAFT  06/28/2008   X4  . CRANIOTOMY Right 07/04/2019   Procedure: Right occipital craniotomy for tumor with brainlab;  Surgeon: Ashok Pall, MD;  Location: [facility];  Service: Neurosurgery;  Laterality: Right;  . ENDARTERECTOMY Right 07/03/2015   Procedure: RIGHT CAROTID ENDARTERECTOMY WITH BOVINE PERICARDIUM PATCH ANGIOPLASTY;  Surgeon: Serafina Mitchell, MD;  Location: [facility];  Service: Vascular;  Laterality: Right;  . LUMBAR LAMINECTOMY/DECOMPRESSION MICRODISCECTOMY N/A 09/06/2014   Procedure: LUMBER DECOMPRESSION L3-5;  Surgeon: Melina Schools, MD;  Location: [facility];  Service: Orthopedics;  Laterality: N/A;  . LUMBAR SPINE SURGERY  2002  . PROSTATE BIOPSY    . PROSTATECTOMY  02/2007  . VASECTOMY      There were no vitals filed for this visit.   Subjective Assessment - 01/09/20 1325    Subjective  Pt and spouse report "moving a little slow today" . Pt denies any pain today.    Patient is accompanied by: Family member   wife   Pertinent History [name] is a 74 y.o. 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 Mickeal [name], prostate  cancer with radiation therapy prostatectomy, CAD with CABG maintained on Xarelto, chronic diastolic congestive heart failure.  Lives with spouse two-level home he had been receiving outpatient therapies.  Presented 10/08/2019 with increasing left-sided weakness headache nausea vomiting as well as bouts of hematuria.   CT of the head showed new severe vasogenic edema throughout the posterior right cerebral hemisphere with 8 mm leftNo evidence of acute intracranial abnormality.  Follow-up medical oncology maintained on Decadron therapy.    Limitations Lt homonymous hemianopsia - no driving    Special Tests Pt goes by "[name]"    Patient Stated Goals maximize  independence    Currently in Pain? No/denies                        OT Treatments/Exercises (OP) - 01/09/20 1327      ADLs   UB Dressing donned/doffed jacket with verbal cues only    LB Dressing socks and shoes - doff/don with adapted strategies for placing foot on stool, utilizing reacher and dressing stick, tape/visual markers for left/right shoes. Pt and spouse educated on where to purchase dressing stick      Fine Motor Coordination (Hand/Wrist)   Fine Motor Coordination Small Pegboard    Large Pegboard cylindrical pegs with LUE in pegboard with no difficulty with visual scanning for colors or reach with LUE but with min drops    Small Pegboard medium pegs with LUE and following patter requiring scanning to left of board for knowing color. min drops and increased time                  OT Education - 01/09/20 1353    Education Details Education provided on dressing stick and purchase from [employer].    Person(s) Educated Patient;Spouse    Methods Explanation    Comprehension Verbalized understanding            OT Short Term Goals - 01/03/20 1311      OT SHORT TERM GOAL #1   Title Pt to verbalize understanding of visual compensatory strategies to improve daily function including reading and environmental scanning    Time 4    Period Weeks    Status Achieved      OT SHORT TERM GOAL #2   Title Pt to perform tabletop scanning with 90% or greater accuracy using strategies    Baseline 85% for 1.5 M number cancellation    Time 4    Period Weeks    Status On-going   28/80 missed     OT SHORT TERM GOAL #3   Title Pt will read 3/4 page of text with accuracy, visual aids/AE and extra time PRN    Time 4    Period Weeks    Status Achieved   met with larger print double spaced and line guide     OT SHORT TERM GOAL #4   Title Pt will perform dressing with supervision/ set up    Baseline min A for shoes/ socks    Time 4    Period Weeks    Status On-going    ongoing, not perfroming at home     OT SHORT TERM GOAL #5   Title Pt will demonstrate improved LUE fine motor coordination for ADLs as evidenced by decreasing 9 hole peg test score to 52 secs or less.    Baseline RUE 33 secs, LUE 58.25    Time 4    Period Weeks    Status On-going  11/21/19:  68.41sec     OT SHORT TERM GOAL #6   Title Pt will demonstrate ability to retrieve a llightweight object at 130 shoulder flexion with LUE demonstrating good control.    Baseline shoulder flexion RUE 140, LUE 120    Time 4    Period Weeks    Status Achieved   11/21/19:  130*            OT Long Term Goals - 10/25/19 0747      OT LONG TERM GOAL #1   Title Pt will perform environmental scanning at 90% accuracy in mod distracting environment    Time 12    Period Weeks    Status New    Target Date 01/23/20      OT LONG TERM GOAL #2   Title Pt to read full page of text or greater with visual aids/AE prn    Time 12    Period Weeks    Status New      OT LONG TERM GOAL #3   Title Pt will demonstrate improved fine motor coordination in LUE for ADLS as evidenced by decreasing 9 hole peg test score to 48 secs or less.    Baseline Pt will perform all basic ADLS with distant supervision.    Time 12    Period Weeks    Status New      OT LONG TERM GOAL #4   Title Pt will perform basic home management tasks with supervision demonstrating good safety awareness.    Time 12    Period Weeks    Status New                 Plan - 01/09/20 1353    Clinical Impression Statement Patient continues to demonstrate decrease in processing speed and cognition post seizure reported last week. Patient a little better than last week. He is demonstrating increase in ADL independence with LB dressing for socks/shoes with min A and verbal cues and donning jacket with supervision.    Occupational performance deficits (Please refer to evaluation for details): IADL's;Work;Leisure;Social Participation;ADL's    Body  Structure / Function / Physical Skills IADL;Endurance;Balance;Vision;ADL;Mobility;Strength;UE functional use;FMC;Coordination;Gait;GMC;ROM;Decreased knowledge of use of DME;Dexterity    Cognitive Skills Memory;Problem Solve;Perception;Thought;Safety Awareness    Comorbidities impacting occupational performance description: glioblastoma - pt has begun radiation    OT Frequency 2x / week    OT Duration 12 weeks    OT Treatment/Interventions Self-care/ADL training;Functional Mobility Training;Therapeutic activities;Coping strategies training;Visual/perceptual remediation/compensation;Patient/family education;Cognitive remediation/compensation;Therapeutic exercise;Balance training;Manual Therapy;Neuromuscular education;Aquatic Therapy;Energy conservation;DME and/or AE instruction;Paraffin;Fluidtherapy;Gait Training;Passive range of motion;Moist Heat    Plan environmental scanning, functional mobility with RW and safety with pathfinding    Consulted and Agree with Plan of Care Patient;Family member/caregiver    Family Member Consulted wife           Patient will benefit from skilled therapeutic intervention in order to improve the following deficits and impairments:   Body Structure / Function / Physical Skills: IADL, Endurance, Balance, Vision, ADL, Mobility, Strength, UE functional use, FMC, Coordination, Gait, GMC, ROM, Decreased knowledge of use of DME, Dexterity Cognitive Skills: Memory, Problem Solve, Perception, Thought, Safety Awareness     Visit Diagnosis: Visuospatial deficit  Homonymous hemianopsia, left  Other lack of coordination  Other abnormalities of gait and mobility  Muscle weakness (generalized)  Unsteadiness on feet    Problem List Patient Active Problem List   Diagnosis Date Noted  . Glioblastoma multiforme ([facility]) 10/11/2019  . Dyslipidemia   .  Seizure prophylaxis   . Chronic diastolic congestive heart failure ([facility])   . Diabetic peripheral neuropathy ([place])     . Palliative care by specialist   . Weakness 10/08/2019  . Vasogenic edema ([facility]) 10/08/2019  . Gross hematuria 10/08/2019  . DNR (do not resuscitate) discussion 07/13/2019  . S/P craniotomy 07/04/2019  . Glioblastoma with isocitrate dehydrogenase gene wildtype ([facility]) 07/04/2019  . Respiratory failure ([facility])   . Encephalopathy acute   . Brain mass 07/01/2019  . Fever 07/01/2019  . Atherosclerosis of native artery of both lower extremities with intermittent claudication ([facility]) 08/23/2018  . Coronary artery disease without angina pectoris 08/23/2018  . Dyspnea on exertion 08/23/2018  . Biochemically recurrent malignant neoplasm of prostate 08/16/2018  . S/P carotid endarterectomy 08/20/2015  . Essential hypertension 08/20/2015  . Type 2 diabetes mellitus with circulatory disorder ([facility]) 08/20/2015  . HLD (hyperlipidemia) 08/20/2015  . Carotid artery stenosis, symptomatic 07/03/2015  . Carotid stenosis, right   . TIA (transient ischemic attack) 06/18/2015  . ASCVD (arteriosclerotic cardiovascular disease) 06/18/2015  . Diabetes mellitus without complication ([facility] [facility]) 74/71/8550  . Resistant hypertension 06/18/2015  . Hyperlipidemia 06/18/2015  . Spinal stenosis of lumbar region with neurogenic claudication 09/06/2014  . Atherosclerosis of native artery of extremity with intermittent claudication ([place]) 03/09/2013  . HYPERLIPIDEMIA 04/08/2009  . TOBACCO ABUSE 04/08/2009  . ATHEROSCLEROTIC CARDIOVASCULAR DISEASE 04/08/2009  . INTERMITTENT VERTIGO 04/08/2009  . ADENOCARCINOMA, PROSTATE 03/21/2009  . ABDOMINAL PAIN -GENERALIZED 03/21/2009  . History of coronary artery bypass graft 06/28/2008    [name] MOT, OTR/L  01/09/2020, 3:02 PM  [facility] [address] [place], [place], [address] Phone: [phone]   Fax:  [phone]  Name: [name] MRN: [id number] Date of Birth: Aug 24, 1945

## 2020-01-09 NOTE — Therapy (Signed)
[facility] [address] [place], [place], [address] Phone: [phone]   Fax:  [phone]  Physical Therapy Treatment  Patient Details  Name: [name] MRN: [id number] Date of Birth: 10-21-1945 Referring Provider (PT): [name]   Encounter Date: 01/09/2020   PT End of Session - 01/09/20 1444    Visit Number 18    Number of Visits 26    Date for PT Re-Evaluation 02/13/20    Authorization Type 10 V PN on 2/63/33; recert 5/45/62 (2x/wek for 4 weeks, 1x/week for 4 more weeks - total 8 weeks)    Progress Note Due on Visit 28    PT Start Time 1400    PT Stop Time 1445    PT Time Calculation (min) 45 min    Equipment Utilized During Treatment Gait belt    Activity Tolerance Patient tolerated treatment well;Patient limited by fatigue    Behavior During Therapy WFL for tasks assessed/performed           Past Medical History:  Diagnosis Date  . Anxiety   . Arthritis   . ASCVD (arteriosclerotic cardiovascular disease)    03/2004: DES x2 to the RCA; IMI with stent occlusion in 02/2005- suboptimal Plavix compliance  . CAD (coronary artery disease)   . Carcinoma of prostate ([place])    Clopidogrel held for biopsy  . Constipation due to pain medication   . Diabetes mellitus without complication ([place])   . Diabetic neuropathy ([place])   . Headache 2021  . Hyperlipidemia   . Hypertension   . Mild depression ([place])   . Morbid obesity ([place])   . Myocardial infarction ([place])   . Shortness of breath dyspnea   . Sleep apnea    no longer uses cpap  . Stroke ([place]) 2017  . Tobacco abuse    stopped smoking 06/28/09    Past Surgical History:  Procedure Laterality Date  . APPLICATION OF CRANIAL NAVIGATION N/A 07/04/2019   Procedure: APPLICATION OF CRANIAL NAVIGATION;  Surgeon: Ashok Pall, MD;  Location: [facility];  Service: Neurosurgery;  Laterality: N/A;  . BACK SURGERY    . CARDIAC CATHETERIZATION     X 1 stent before  having CABG  . CORONARY ARTERY BYPASS GRAFT  06/28/2008   X4  . CRANIOTOMY Right 07/04/2019   Procedure: Right occipital craniotomy for tumor with brainlab;  Surgeon: Ashok Pall, MD;  Location: [facility];  Service: Neurosurgery;  Laterality: Right;  . ENDARTERECTOMY Right 07/03/2015   Procedure: RIGHT CAROTID ENDARTERECTOMY WITH BOVINE PERICARDIUM PATCH ANGIOPLASTY;  Surgeon: Serafina Mitchell, MD;  Location: [facility];  Service: Vascular;  Laterality: Right;  . LUMBAR LAMINECTOMY/DECOMPRESSION MICRODISCECTOMY N/A 09/06/2014   Procedure: LUMBER DECOMPRESSION L3-5;  Surgeon: Melina Schools, MD;  Location: [facility];  Service: Orthopedics;  Laterality: N/A;  . LUMBAR SPINE SURGERY  2002  . PROSTATE BIOPSY    . PROSTATECTOMY  02/2007  . VASECTOMY      There were no vitals filed for this visit.   Subjective Assessment - 01/09/20 1410    Subjective Pt reports that he just started new chemo regimen yesterday which is for 5 days. They increase Kapra from 500 to 1000mg  wice a day and side effects of that may be increased fatigue.    Patient is accompained by: Family member    Pertinent History PMH: Rt occipital craniotomy 07/04/19 secondary to glioblastoma. PMH: Prostate CA, CVA 2017 (no residual deficits), CAD, DM, HTN, hx of 2 lumbar surgeries 2002,  2016 (laminectomies)    Limitations Standing;Walking    How long can you sit comfortably? no issues    How long can you stand comfortably? 5 min    How long can you walk comfortably? 5 min    Patient Stated Goals walk better              [facility] PT Assessment - 01/09/20 1416      Berg Balance Test   Sit to Stand Able to stand  independently using hands    Standing Unsupported Able to stand safely 2 minutes    Sitting with Back Unsupported but Feet Supported on Floor or Stool Able to sit safely and securely 2 minutes    Stand to Sit Sits safely with minimal use of hands    Transfers Able to transfer safely, definite need of hands    Standing Unsupported  with Eyes Closed Able to stand 10 seconds safely    Standing Unsupported with Feet Together Able to place feet together independently and stand 1 minute safely    From Standing, Reach Forward with Outstretched Arm Can reach forward >12 cm safely (5")    From Standing Position, Pick up Object from Floor Able to pick up shoe, needs supervision    From Standing Position, Turn to Look Behind Over each Shoulder Looks behind from both sides and weight shifts well    Turn 360 Degrees Able to turn 360 degrees safely but slowly    Standing Unsupported, Alternately Place Feet on Step/Stool Able to stand independently and safely and complete 8 steps in 20 seconds    Standing Unsupported, One Foot in Front Able to plae foot ahead of the other independently and hold 30 seconds    Standing on One Leg Tries to lift leg/unable to hold 3 seconds but remains standing independently    Total Score 46             5x sit to stand performed BBS performed Gait training: 115' with RW and CGA-pt reported significant fatigue Gait training: quad cane: 2 x 55' with seated rest break in between.                      PT Short Term Goals - 01/09/20 1412      PT SHORT TERM GOAL #1   Title Pt will be able to perform 5x sit to stand under 30 seconds with bil UE to improve functional strength from standard chair.    Baseline Pt unable to perform them (10/24/19); unable to without UE support (11/24/19); attempted with one UE but pt only able to complete 1 rep with multiple tries; pt was able to stand up using one UE on chair and second hand on walker (12/12/19); pt able to complete 3 reps with R UE on chair and L UE on walker but unable to complete 5 reps (12/19/19); bil UE use 42 seconds; bil UE use 45 seconds (01/09/20)    Time 4    Period Weeks    Status Revised    Target Date 01/16/20      PT SHORT TERM GOAL #2   Title Patient will be able to ambulate for 10 min without rest with use of his walker to  improve walking endurnace    Baseline <5 min (10/24/19); 5-10 min (11/29/19);    Time 4    Period Weeks    Status Achieved    Target Date 11/21/19      PT  SHORT TERM GOAL #3   Title Pt will demo 4 points improvement on Berg balance scale to improve overall static balance    Baseline 37/56 (10/24/19); 8/6 42/56; 01/09/20 46/56    Time 4    Period Weeks    Status Achieved    Target Date 11/21/19             PT Long Term Goals - 01/09/20 1431      PT LONG TERM GOAL #1   Title Pt will demo 47/56 or better on BBS to improve balance and reduce fall risk    Baseline 37/26 (10/24/19);11/24/19 42/56; 12/19/19 47/56    Time 8    Period Weeks    Status Achieved      PT LONG TERM GOAL #2   Title Patient will be able to perform sit to stand in under 25 seconds to improve overall strength    Baseline bil UE 42 seconds (12/19/19)    Time 8    Period Weeks    Status Revised      PT LONG TERM GOAL #3   Title Patient will be able to ambulate 65' with small based quad cane and SBA to improve short distance ambulation    Baseline quad cane 115' (12/19/19)    Time 8    Period Weeks    Status Revised      PT LONG TERM GOAL #4   Title Pt will demo improvement by 0.15 m/s with use of st. cane to improve functional gait velocity    Baseline 0.75m/s with cane, 0.56m/s with rolling walker (eval)    Time 8    Status Revised                 Plan - 01/09/20 1446    Clinical Impression Statement Today's skilled session was focused on performing some reassessment. Patient demonstrated no significant improvement in BBS or 5x sit to stand since last reassessment. This may be due to some recent medication changes and start of chemo as patient was feeling more fatigue then usual. Patient still has potential to improve BBS score by up to 4-5 points.    Personal Factors and Comorbidities Comorbidity 3+    Comorbidities craniotomy secondary to glioblastoma, hx of lumbar surgeries x 2, CABG x 4     Examination-Activity Limitations Squat;Stairs;Locomotion Level;Caring for Others;Carry    Examination-Participation Restrictions Cleaning;Community Activity;Driving;Laundry;Yard Work;Shop    Stability/Clinical Decision Making Unstable/Unpredictable    Rehab Potential Good    PT Frequency Other (comment)   2x week for 4 weeks and 1x/week for 4 more weeks for total of 8 weeks.   PT Duration 8 weeks    PT Treatment/Interventions ADLs/Self Care Home Management;Gait training;Stair training;Functional mobility training;Therapeutic activities;Therapeutic exercise;Balance training;Neuromuscular re-education;Vestibular;Visual/perceptual remediation/compensation;Manual techniques;Energy conservation;Joint Manipulations;Patient/family education;Passive range of motion    PT Next Visit Plan Focus on SLS, forward reaching, picking object from floor, turning 360 to improve BBS    PT Home Exercise Plan Access Code: [email or web address]    Consulted and Agree with Plan of Care Patient;Family member/caregiver    Family Member Consulted wife           Patient will benefit from skilled therapeutic intervention in order to improve the following deficits and impairments:  Abnormal gait, Decreased activity tolerance, Decreased balance, Decreased mobility, Decreased endurance, Difficulty walking, Dizziness, Impaired vision/preception  Visit Diagnosis: Unsteadiness on feet  Muscle weakness (generalized)  Other abnormalities of gait and mobility  Dizziness and giddiness  Glioblastoma multiforme (HCC)  Encephalopathy acute     Problem List Patient Active Problem List   Diagnosis Date Noted  . 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 Dyslipidemia   . Seizure prophylaxis   . Chronic diastolic congestive heart failure ([place])   . Diabetic peripheral neuropathy ([place])   . Palliative care by specialist   . Weakness 10/08/2019  . Vasogenic edema ([place]) 10/08/2019  . Gross hematuria 10/08/2019  . DNR (do not  resuscitate) discussion 07/13/2019  . S/P craniotomy 07/04/2019  . 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 Respiratory failure ([place])   . Encephalopathy acute   . Brain mass 07/01/2019  . Fever 07/01/2019  . Atherosclerosis of native artery of both lower extremities with intermittent claudication ([place]) 08/23/2018  . Coronary artery disease without angina pectoris 08/23/2018  . Dyspnea on exertion 08/23/2018  . Biochemically recurrent malignant neoplasm of prostate 08/16/2018  . S/P carotid endarterectomy 08/20/2015  . Essential hypertension 08/20/2015  . 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 HLD (hyperlipidemia) 08/20/2015  . Carotid artery stenosis, symptomatic 07/03/2015  . Carotid stenosis, right   . TIA (transient ischemic attack) 06/18/2015  . ASCVD (arteriosclerotic cardiovascular disease) 06/18/2015  . Diabetes mellitus without complication ([place]) 14/43/1540  . Resistant hypertension 06/18/2015  . Hyperlipidemia 06/18/2015  . Spinal stenosis of lumbar region with neurogenic claudication 09/06/2014  . Atherosclerosis of native artery of extremity with intermittent claudication ([place]) 03/09/2013  . HYPERLIPIDEMIA 04/08/2009  . TOBACCO ABUSE 04/08/2009  . ATHEROSCLEROTIC CARDIOVASCULAR DISEASE 04/08/2009  . INTERMITTENT VERTIGO 04/08/2009  . ADENOCARCINOMA, PROSTATE 03/21/2009  . ABDOMINAL PAIN -GENERALIZED 03/21/2009  . History of coronary artery bypass graft 06/28/2008    Kerrie Pleasure, PT 01/09/2020, 2:48 PM  [facility] [address] [place], [place], [address] Phone: [phone]   Fax:  [phone]  Name: [name] MRN: [id number] Date of Birth: August 04, 1945

## 2020-01-11 DIAGNOSIS — C719 Malignant neoplasm of brain, unspecified: Secondary | ICD-10-CM | POA: Diagnosis not present

## 2020-01-11 DIAGNOSIS — G4733 Obstructive sleep apnea (adult) (pediatric): Secondary | ICD-10-CM | POA: Diagnosis not present

## 2020-01-11 DIAGNOSIS — I739 Peripheral vascular disease, unspecified: Secondary | ICD-10-CM | POA: Diagnosis not present

## 2020-01-11 DIAGNOSIS — M48061 Spinal stenosis, lumbar region without neurogenic claudication: Secondary | ICD-10-CM | POA: Diagnosis not present

## 2020-01-11 DIAGNOSIS — I2510 Atherosclerotic heart disease of native coronary artery without angina pectoris: Secondary | ICD-10-CM | POA: Diagnosis not present

## 2020-01-11 DIAGNOSIS — I6523 Occlusion and stenosis of bilateral carotid arteries: Secondary | ICD-10-CM | POA: Diagnosis not present

## 2020-01-11 DIAGNOSIS — E119 Type 2 diabetes mellitus without complications: Secondary | ICD-10-CM | POA: Diagnosis not present

## 2020-01-11 DIAGNOSIS — G459 Transient cerebral ischemic attack, unspecified: Secondary | ICD-10-CM | POA: Diagnosis not present

## 2020-01-11 DIAGNOSIS — Z0000 Encounter for general adult medical examination without abnormal findings: Secondary | ICD-10-CM | POA: Diagnosis not present

## 2020-01-11 DIAGNOSIS — I10 Essential (primary) hypertension: Secondary | ICD-10-CM | POA: Diagnosis not present

## 2020-01-11 DIAGNOSIS — Z13220 Encounter for screening for lipoid disorders: Secondary | ICD-10-CM | POA: Diagnosis not present

## 2020-01-12 ENCOUNTER — Encounter: Payer: Self-pay | Admitting: Occupational Therapy

## 2020-01-12 ENCOUNTER — Other Ambulatory Visit: Payer: Self-pay

## 2020-01-12 ENCOUNTER — Ambulatory Visit: Payer: Medicare Other | Admitting: Occupational Therapy

## 2020-01-12 ENCOUNTER — Ambulatory Visit: Payer: Medicare Other

## 2020-01-12 DIAGNOSIS — H53462 Homonymous bilateral field defects, left side: Secondary | ICD-10-CM

## 2020-01-12 DIAGNOSIS — R2689 Other abnormalities of gait and mobility: Secondary | ICD-10-CM

## 2020-01-12 DIAGNOSIS — M6281 Muscle weakness (generalized): Secondary | ICD-10-CM

## 2020-01-12 DIAGNOSIS — R278 Other lack of coordination: Secondary | ICD-10-CM | POA: Diagnosis not present

## 2020-01-12 DIAGNOSIS — R41842 Visuospatial deficit: Secondary | ICD-10-CM

## 2020-01-12 DIAGNOSIS — R2681 Unsteadiness on feet: Secondary | ICD-10-CM

## 2020-01-12 DIAGNOSIS — R42 Dizziness and giddiness: Secondary | ICD-10-CM

## 2020-01-12 DIAGNOSIS — G9340 Encephalopathy, unspecified: Secondary | ICD-10-CM

## 2020-01-12 DIAGNOSIS — R41840 Attention and concentration deficit: Secondary | ICD-10-CM | POA: Diagnosis not present

## 2020-01-12 NOTE — Therapy (Signed)
[facility] [address] [place], [place], [address] Phone: [phone]   Fax:  [phone]  Physical Therapy Treatment  Patient Details  Name: [name] MRN: [id number] Date of Birth: 04/13/1946 Referring Provider (PT): [name]   Encounter Date: 01/12/2020   PT End of Session - 01/12/20 1320    Visit Number 19    Number of Visits 26    Date for PT Re-Evaluation 02/13/20    Authorization Type 10 V PN on 04/25/24; recert 9/48/54 (2x/wek for 4 weeks, 1x/week for 4 more weeks - total 8 weeks)    Progress Note Due on Visit 28    PT Start Time 1315    PT Stop Time 1400    PT Time Calculation (min) 45 min    Equipment Utilized During Treatment Gait belt    Activity Tolerance Patient tolerated treatment well;Patient limited by fatigue    Behavior During Therapy WFL for tasks assessed/performed           Past Medical History:  Diagnosis Date  . Anxiety   . Arthritis   . ASCVD (arteriosclerotic cardiovascular disease)    03/2004: DES x2 to the RCA; IMI with stent occlusion in 02/2005- suboptimal Plavix compliance  . CAD (coronary artery disease)   . Carcinoma of prostate ([place])    Clopidogrel held for biopsy  . Constipation due to pain medication   . Diabetes mellitus without complication ([place])   . Diabetic neuropathy ([place])   . Headache 2021  . Hyperlipidemia   . Hypertension   . Mild depression ([place])   . Morbid obesity ([place])   . Myocardial infarction ([place])   . Shortness of breath dyspnea   . Sleep apnea    no longer uses cpap  . Stroke ([place]) 2017  . Tobacco abuse    stopped smoking 06/28/09    Past Surgical History:  Procedure Laterality Date  . APPLICATION OF CRANIAL NAVIGATION N/A 07/04/2019   Procedure: APPLICATION OF CRANIAL NAVIGATION;  Surgeon: Ashok Pall, MD;  Location: [facility];  Service: Neurosurgery;  Laterality: N/A;  . BACK SURGERY    . CARDIAC CATHETERIZATION     X 1 stent before  having CABG  . CORONARY ARTERY BYPASS GRAFT  06/28/2008   X4  . CRANIOTOMY Right 07/04/2019   Procedure: Right occipital craniotomy for tumor with brainlab;  Surgeon: Ashok Pall, MD;  Location: [facility];  Service: Neurosurgery;  Laterality: Right;  . ENDARTERECTOMY Right 07/03/2015   Procedure: RIGHT CAROTID ENDARTERECTOMY WITH BOVINE PERICARDIUM PATCH ANGIOPLASTY;  Surgeon: Serafina Mitchell, MD;  Location: [facility];  Service: Vascular;  Laterality: Right;  . LUMBAR LAMINECTOMY/DECOMPRESSION MICRODISCECTOMY N/A 09/06/2014   Procedure: LUMBER DECOMPRESSION L3-5;  Surgeon: Melina Schools, MD;  Location: [facility];  Service: Orthopedics;  Laterality: N/A;  . LUMBAR SPINE SURGERY  2002  . PROSTATE BIOPSY    . PROSTATECTOMY  02/2007  . VASECTOMY      There were no vitals filed for this visit.   Subjective Assessment - 01/12/20 1317    Subjective I am feeling okay today.    Patient is accompained by: Family member    Pertinent History PMH: Rt occipital craniotomy 07/04/19 secondary to glioblastoma. PMH: Prostate CA, CVA 2017 (no residual deficits), CAD, DM, HTN, hx of 2 lumbar surgeries 2002, 2016 (laminectomies)    Limitations Standing;Walking    How long can you sit comfortably? no issues    How long can you stand comfortably? 5 min  How long can you walk comfortably? 5 min    Patient Stated Goals walk better              Gait training: 1 x 100', 1 x 130' with quad cane, cues to keep cane closer and not put it too far in front of him, scanning the environment in front of him.  Figure 8 walking and turning: with quad cane: 2x  Stair training: 12 steps , bil hand rails, going up fwd leading with R leg, coming down backwards leading with L LE first                            PT Short Term Goals - 01/09/20 1412      PT SHORT TERM GOAL #1   Title Pt will be able to perform 5x sit to stand under 30 seconds with bil UE to improve functional strength from standard chair.     Baseline Pt unable to perform them (10/24/19); unable to without UE support (11/24/19); attempted with one UE but pt only able to complete 1 rep with multiple tries; pt was able to stand up using one UE on chair and second hand on walker (12/12/19); pt able to complete 3 reps with R UE on chair and L UE on walker but unable to complete 5 reps (12/19/19); bil UE use 42 seconds; bil UE use 45 seconds (01/09/20)    Time 4    Period Weeks    Status Revised    Target Date 01/16/20      PT SHORT TERM GOAL #2   Title Patient will be able to ambulate for 10 min without rest with use of his walker to improve walking endurnace    Baseline <5 min (10/24/19); 5-10 min (11/29/19);    Time 4    Period Weeks    Status Achieved    Target Date 11/21/19      PT SHORT TERM GOAL #3   Title Pt will demo 4 points improvement on Berg balance scale to improve overall static balance    Baseline 37/56 (10/24/19); 8/6 42/56; 01/09/20 46/56    Time 4    Period Weeks    Status Achieved    Target Date 11/21/19             PT Long Term Goals - 01/09/20 1431      PT LONG TERM GOAL #1   Title Pt will demo 47/56 or better on BBS to improve balance and reduce fall risk    Baseline 37/26 (10/24/19);11/24/19 42/56; 12/19/19 47/56    Time 8    Period Weeks    Status Achieved      PT LONG TERM GOAL #2   Title Patient will be able to perform sit to stand in under 25 seconds to improve overall strength    Baseline bil UE 42 seconds (12/19/19)    Time 8    Period Weeks    Status Revised      PT LONG TERM GOAL #3   Title Patient will be able to ambulate 25' with small based quad cane and SBA to improve short distance ambulation    Baseline quad cane 115' (12/19/19)    Time 8    Period Weeks    Status Revised      PT LONG TERM GOAL #4   Title Pt will demo improvement by 0.15 m/s with use of st. cane to improve  functional gait velocity    Baseline 0.13m/s with cane, 0.28m/s with rolling walker (eval)    Time 8    Status  Revised                 Plan - 01/12/20 1332    Clinical Impression Statement Pt able to walk 23' total with quad cane today with CGA for supervision and unsteadiness. Pt requires verbal cues to negotiate around obstacles.    Personal Factors and Comorbidities Comorbidity 3+    Comorbidities craniotomy secondary to glioblastoma, hx of lumbar surgeries x 2, CABG x 4    Examination-Activity Limitations Squat;Stairs;Locomotion Level;Caring for Others;Carry    Examination-Participation Restrictions Cleaning;Community Activity;Driving;Laundry;Yard Work;Shop    Stability/Clinical Decision Making Unstable/Unpredictable    Rehab Potential Good    PT Frequency Other (comment)   2x week for 4 weeks and 1x/week for 4 more weeks for total of 8 weeks.   PT Duration 8 weeks    PT Treatment/Interventions ADLs/Self Care Home Management;Gait training;Stair training;Functional mobility training;Therapeutic activities;Therapeutic exercise;Balance training;Neuromuscular re-education;Vestibular;Visual/perceptual remediation/compensation;Manual techniques;Energy conservation;Joint Manipulations;Patient/family education;Passive range of motion    PT Next Visit Plan Focus on SLS, forward reaching, picking object from floor, turning 360 to improve BBS    PT Home Exercise Plan Access Code: [email or web address]    Consulted and Agree with Plan of Care Patient;Family member/caregiver    Family Member Consulted wife           Patient will benefit from skilled therapeutic intervention in order to improve the following deficits and impairments:  Abnormal gait, Decreased activity tolerance, Decreased balance, Decreased mobility, Decreased endurance, Difficulty walking, Dizziness, Impaired vision/preception  Visit Diagnosis: Other abnormalities of gait and mobility  Muscle weakness (generalized)  Unsteadiness on feet  Dizziness and giddiness  Encephalopathy acute     Problem List Patient Active Problem List    Diagnosis Date Noted  . Glioblastoma multiforme ([place]) 10/11/2019  . Dyslipidemia   . Seizure prophylaxis   . Chronic diastolic congestive heart failure ([place])   . Diabetic peripheral neuropathy ([place])   . Palliative care by specialist   . Weakness 10/08/2019  . Vasogenic edema ([place]) 10/08/2019  . Gross hematuria 10/08/2019  . DNR (do not resuscitate) discussion 07/13/2019  . S/P craniotomy 07/04/2019  . 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 Respiratory failure ([place])   . Encephalopathy acute   . Brain mass 07/01/2019  . Fever 07/01/2019  . Atherosclerosis of native artery of both lower extremities with intermittent claudication ([place]) 08/23/2018  . Coronary artery disease without angina pectoris 08/23/2018  . Dyspnea on exertion 08/23/2018  . Biochemically recurrent malignant neoplasm of prostate 08/16/2018  . S/P carotid endarterectomy 08/20/2015  . Essential hypertension 08/20/2015  . Type 2 diabetes mellitus with circulatory disorder ([place]) 08/20/2015  . HLD (hyperlipidemia) 08/20/2015  . Carotid artery stenosis, symptomatic 07/03/2015  . Carotid stenosis, right   . TIA (transient ischemic attack) 06/18/2015  . ASCVD (arteriosclerotic cardiovascular disease) 06/18/2015  . Diabetes mellitus without complication ([place]) 45/80/9983  . Resistant hypertension 06/18/2015  . Hyperlipidemia 06/18/2015  . Spinal stenosis of lumbar region with neurogenic claudication 09/06/2014  . Atherosclerosis of native artery of extremity with intermittent claudication ([place]) 03/09/2013  . HYPERLIPIDEMIA 04/08/2009  . TOBACCO ABUSE 04/08/2009  . ATHEROSCLEROTIC CARDIOVASCULAR DISEASE 04/08/2009  . INTERMITTENT VERTIGO 04/08/2009  . ADENOCARCINOMA, PROSTATE 03/21/2009  . ABDOMINAL PAIN -GENERALIZED 03/21/2009  . History of coronary artery bypass graft 06/28/2008    Kerrie Pleasure, PT 01/12/2020, 1:57  PM  [facility] [address] [place], [place], [address] Phone: [phone]   Fax:  [phone]  Name: [name] MRN: [id number] Date of Birth: 02-10-1946

## 2020-01-12 NOTE — Therapy (Signed)
[facility] [address] [place], [place], [address] Phone: [phone]   Fax:  [phone]  Occupational Therapy Recertification  Patient Details  Name: [name] MRN: [id number] Date of Birth: 1945/08/21 Referring Provider (OT): Dr. Bertis Ruddy   Encounter Date: 01/12/2020   OT End of Session - 01/12/20 1239    Visit Number 19    Number of Visits 25    Date for OT Re-Evaluation 02/23/20    Authorization Type MCR primary, AARP secondary    Authorization Time Period 90 days, may d/c after 8 weeks dep on progress.    Authorization - Visit Number 19    Authorization - Number of Visits --    Progress Note Due on Visit 60    OT Start Time 1233    OT Stop Time 1315    OT Time Calculation (min) 42 min    Activity Tolerance Patient tolerated treatment well    Behavior During Therapy WFL for tasks assessed/performed           Past Medical History:  Diagnosis Date  . Anxiety   . Arthritis   . ASCVD (arteriosclerotic cardiovascular disease)    03/2004: DES x2 to the RCA; IMI with stent occlusion in 02/2005- suboptimal Plavix compliance  . CAD (coronary artery disease)   . Carcinoma of prostate ([place])    Clopidogrel held for biopsy  . Constipation due to pain medication   . Diabetes mellitus without complication ([place])   . Diabetic neuropathy ([place])   . Headache 2021  . Hyperlipidemia   . Hypertension   . Mild depression ([place])   . Morbid obesity ([place])   . Myocardial infarction ([place])   . Shortness of breath dyspnea   . Sleep apnea    no longer uses cpap  . Stroke ([place]) 2017  . Tobacco abuse    stopped smoking 06/28/09    Past Surgical History:  Procedure Laterality Date  . APPLICATION OF CRANIAL NAVIGATION N/A 07/04/2019   Procedure: APPLICATION OF CRANIAL NAVIGATION;  Surgeon: Ashok Pall, MD;  Location: [facility];  Service: Neurosurgery;  Laterality: N/A;  . BACK SURGERY    . CARDIAC CATHETERIZATION      X 1 stent before having CABG  . CORONARY ARTERY BYPASS GRAFT  06/28/2008   X4  . CRANIOTOMY Right 07/04/2019   Procedure: Right occipital craniotomy for tumor with brainlab;  Surgeon: Ashok Pall, MD;  Location: [facility];  Service: Neurosurgery;  Laterality: Right;  . ENDARTERECTOMY Right 07/03/2015   Procedure: RIGHT CAROTID ENDARTERECTOMY WITH BOVINE PERICARDIUM PATCH ANGIOPLASTY;  Surgeon: Serafina Mitchell, MD;  Location: [facility];  Service: Vascular;  Laterality: Right;  . LUMBAR LAMINECTOMY/DECOMPRESSION MICRODISCECTOMY N/A 09/06/2014   Procedure: LUMBER DECOMPRESSION L3-5;  Surgeon: Melina Schools, MD;  Location: [facility];  Service: Orthopedics;  Laterality: N/A;  . LUMBAR SPINE SURGERY  2002  . PROSTATE BIOPSY    . PROSTATECTOMY  02/2007  . VASECTOMY      There were no vitals filed for this visit.   Subjective Assessment - 01/12/20 1234    Subjective  Nothing new at home. No changes No falls. "I get tired in the hips sometimes" . Pt reports his legs get shaky in the mornings when he is standing "feels like I am trembling"    Patient is accompanied by: Family member   wife   Pertinent History [name] is a 74 y.o. 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, prostate cancer with radiation therapy prostatectomy, CAD with CABG maintained on Xarelto, chronic diastolic congestive heart failure.  Lives with spouse two-level home he had been receiving outpatient therapies.  Presented 10/08/2019 with increasing left-sided weakness headache nausea vomiting as well as bouts of hematuria.   CT of the head showed new severe vasogenic edema throughout the posterior right cerebral hemisphere with 8 mm leftNo evidence of acute intracranial abnormality.  Follow-up medical oncology maintained on Decadron therapy.    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 No/denies              Treatment:   Reviewed STGs and addressed. Patient has met 5/6 STGs at this time.  Worked on table top scanning exercises - 88 cancellation activity, copying near point phone numbers with scanning left - right. Completed with 3 errors.   Discussed goals and addition of kitchen mobility and safety as patient is frequently navigating in and out of the kitchen and requiring assistance for safety and scanning for items.         OT Short Term Goals - 01/12/20 1236      OT SHORT TERM GOAL #1   Title Pt to verbalize understanding of visual compensatory strategies to improve daily function including reading and environmental scanning    Time 4    Period Weeks    Status Achieved      OT SHORT TERM GOAL #2   Title Pt to perform tabletop scanning with 90% or greater accuracy using strategies    Baseline 85% for 1.5 M number cancellation    Time 4    Period Weeks    Status On-going   53% accuracy 01/12/20     OT SHORT TERM GOAL #3   Title Pt will read 3/4 page of text with accuracy, visual aids/AE and extra time PRN    Time 4    Period Weeks    Status Achieved   met with larger print double spaced and line guide     OT SHORT TERM GOAL #4   Title Pt will perform dressing with supervision/ set up    Baseline min A for shoes/ socks    Time 4    Period Weeks    Status Achieved   pt supervision with increased time with socks.  supervision with shoes (needs help with orientation). 01/12/20     OT SHORT TERM GOAL #5   Title Pt will demonstrate improved LUE fine motor coordination for ADLs as evidenced by decreasing 9 hole peg test score to 52 secs or less.    Baseline RUE 33 secs, LUE 58.25    Time 4    Period Weeks    Status Achieved   01/12/20 - 49.31 seconds     OT SHORT TERM GOAL #6   Title Pt will demonstrate ability to  retrieve a llightweight object at 130 shoulder flexion with LUE demonstrating good control.    Baseline shoulder flexion RUE 140, LUE 120    Time 4    Period Weeks    Status Achieved   11/21/19:  130*            OT Long Term Goals - 01/12/20 1246      OT LONG TERM GOAL #  1   Title Pt will perform environmental scanning at 90% accuracy in mod distracting environment    Time 12    Period Weeks    Status New      OT LONG TERM GOAL #2   Title Pt to read full page of text or greater with visual aids/AE prn    Time 12    Period Weeks    Status New      OT LONG TERM GOAL #3   Title Pt will demonstrate improved fine motor coordination in LUE for ADLS as evidenced by decreasing 9 hole peg test score to 48 secs or less.    Baseline Pt will perform all basic ADLS with distant supervision.    Time 12    Period Weeks    Status On-going   LUE 49.31 seconds - 01/12/20     OT LONG TERM GOAL #4   Title Pt will perform basic home management tasks with supervision demonstrating good safety awareness.    Time 12    Period Weeks    Status Deferred   pt is not performing any home management and not appropriate at this time. 01/12/20     OT LONG TERM GOAL #5   Title Pt will negotiate in kitchen with good safety precautions and utilize visual strategies to locate preferred item with supervision. (new goal at renewal)    Time 6    Period Weeks    Status New    Target Date 02/23/20                 Plan - 01/12/20 1252    Clinical Impression Statement Patient has met 5/6 short term goals. OT addressed goals and progress in order to complete recertification this day. Patient has made progress in areas of self-care and fine motor coordination but continues to demonstrate difficulty with visual scanning and attention to the left. Skilled occupational therapy is still recommended to target the areas of deficit and increase independence and decrease caregiver burden. Pt is benefitting from skilled  OT.    OT Occupational Profile and History Detailed Assessment- Review of Records and additional review of physical, cognitive, psychosocial history related to current functional performance    Occupational performance deficits (Please refer to evaluation for details): IADL's;Work;Leisure;Social Participation;ADL's    Body Structure / Function / Physical Skills IADL;Endurance;Balance;Vision;ADL;Mobility;Strength;UE functional use;FMC;Coordination;Gait;GMC;ROM;Decreased knowledge of use of DME;Dexterity    Cognitive Skills Memory;Problem Solve;Perception;Thought;Safety Awareness    Rehab Potential Good    Comorbidities impacting occupational performance description: glioblastoma - pt has begun radiation    OT Frequency 1x / week   2x/week for 2 weeks and 1x/week for 4 weeks   OT Duration 6 weeks    OT Treatment/Interventions Self-care/ADL training;Functional Mobility Training;Therapeutic activities;Coping strategies training;Visual/perceptual remediation/compensation;Patient/family education;Cognitive remediation/compensation;Therapeutic exercise;Balance training;Manual Therapy;Neuromuscular education;Aquatic Therapy;Energy conservation;DME and/or AE instruction;Paraffin;Fluidtherapy;Gait Training;Passive range of motion;Moist Heat    Plan environmental and tabletop scanning, RW safety and negotation    Consulted and Agree with Plan of Care Patient;Family member/caregiver    Family Member Consulted wife           Patient will benefit from skilled therapeutic intervention in order to improve the following deficits and impairments:   Body Structure / Function / Physical Skills: IADL, Endurance, Balance, Vision, ADL, Mobility, Strength, UE functional use, FMC, Coordination, Gait, GMC, ROM, Decreased knowledge of use of DME, Dexterity Cognitive Skills: Memory, Problem Solve, Perception, Thought, Safety Awareness     Visit Diagnosis: Homonymous hemianopsia, left -  Plan: Ot plan of care  cert/re-cert  Visuospatial deficit - Plan: Ot plan of care cert/re-cert  Other lack of coordination - Plan: Ot plan of care cert/re-cert  Other abnormalities of gait and mobility - Plan: Ot plan of care cert/re-cert  Muscle weakness (generalized) - Plan: Ot plan of care cert/re-cert  Unsteadiness on feet - Plan: Ot plan of care cert/re-cert    Problem List Patient Active Problem List   Diagnosis Date Noted  . Glioblastoma multiforme ([facility]) 10/11/2019  . Dyslipidemia   . Seizure prophylaxis   . Chronic diastolic congestive heart failure ([place])   . Diabetic peripheral neuropathy ([facility])   . Palliative care by specialist   . Weakness 10/08/2019  . Vasogenic edema ([place]) 10/08/2019  . Gross hematuria 10/08/2019  . DNR (do not resuscitate) discussion 07/13/2019  . S/P craniotomy 07/04/2019  . Glioblastoma with isocitrate dehydrogenase gene wildtype ([place]) 07/04/2019  . Respiratory failure ([facility])   . Encephalopathy acute   . Brain mass 07/01/2019  . Fever 07/01/2019  . Atherosclerosis of native artery of both lower extremities with intermittent claudication ([place]) 08/23/2018  . Coronary artery disease without angina pectoris 08/23/2018  . Dyspnea on exertion 08/23/2018  . Biochemically recurrent malignant neoplasm of prostate 08/16/2018  . S/P carotid endarterectomy 08/20/2015  . Essential hypertension 08/20/2015  . Type 2 diabetes mellitus with circulatory disorder ([place]) 08/20/2015  . HLD (hyperlipidemia) 08/20/2015  . Carotid artery stenosis, symptomatic 07/03/2015  . Carotid stenosis, right   . TIA (transient ischemic attack) 06/18/2015  . ASCVD (arteriosclerotic cardiovascular disease) 06/18/2015  . Diabetes mellitus without complication ([facility]) 87/56/4332  . Resistant hypertension 06/18/2015  . Hyperlipidemia 06/18/2015  . Spinal stenosis of lumbar region with neurogenic claudication 09/06/2014  . Atherosclerosis of native artery of extremity with intermittent claudication ([facility])  03/09/2013  . HYPERLIPIDEMIA 04/08/2009  . TOBACCO ABUSE 04/08/2009  . ATHEROSCLEROTIC CARDIOVASCULAR DISEASE 04/08/2009  . INTERMITTENT VERTIGO 04/08/2009  . ADENOCARCINOMA, PROSTATE 03/21/2009  . ABDOMINAL PAIN -GENERALIZED 03/21/2009  . History of coronary artery bypass graft 06/28/2008    Zachery Conch  MOT, OTR/L  01/12/2020, 3:10 PM  [facility] [address] [place], [place], [address] Phone: [phone]   Fax:  [phone]  Name: JANMICHAEL GIRAUD MRN: [id number] Date of Birth: 04/29/1945

## 2020-01-16 ENCOUNTER — Ambulatory Visit: Payer: Medicare Other

## 2020-01-16 ENCOUNTER — Encounter: Payer: Self-pay | Admitting: Occupational Therapy

## 2020-01-16 ENCOUNTER — Other Ambulatory Visit: Payer: Self-pay

## 2020-01-16 ENCOUNTER — Ambulatory Visit: Payer: Medicare Other | Admitting: Occupational Therapy

## 2020-01-16 DIAGNOSIS — R278 Other lack of coordination: Secondary | ICD-10-CM | POA: Diagnosis not present

## 2020-01-16 DIAGNOSIS — R41840 Attention and concentration deficit: Secondary | ICD-10-CM | POA: Diagnosis not present

## 2020-01-16 DIAGNOSIS — R41842 Visuospatial deficit: Secondary | ICD-10-CM | POA: Diagnosis not present

## 2020-01-16 DIAGNOSIS — R2681 Unsteadiness on feet: Secondary | ICD-10-CM | POA: Diagnosis not present

## 2020-01-16 DIAGNOSIS — M6281 Muscle weakness (generalized): Secondary | ICD-10-CM | POA: Diagnosis not present

## 2020-01-16 DIAGNOSIS — H53462 Homonymous bilateral field defects, left side: Secondary | ICD-10-CM

## 2020-01-16 DIAGNOSIS — R2689 Other abnormalities of gait and mobility: Secondary | ICD-10-CM

## 2020-01-16 NOTE — Therapy (Signed)
[facility] [address] [place], [place], [address] Phone: [phone]   Fax:  [phone]  Occupational Therapy Treatment  Patient Details  Name: [name] MRN: [id number] Date of Birth: January 29, 1946 Referring Provider (OT): Dr. Bertis Ruddy   Encounter Date: 01/16/2020   OT End of Session - 01/16/20 1237    Visit Number 20    Number of Visits 25    Date for OT Re-Evaluation 02/23/20    Authorization Type MCR primary, AARP secondary    Authorization Time Period 90 days, may d/c after 8 weeks dep on progress.    Authorization - Visit Number 20    Progress Note Due on Visit 20    OT Start Time 1234    OT Stop Time 1312    OT Time Calculation (min) 38 min    Activity Tolerance Patient tolerated treatment well    Behavior During Therapy WFL for tasks assessed/performed           Past Medical History:  Diagnosis Date  . Anxiety   . Arthritis   . ASCVD (arteriosclerotic cardiovascular disease)    03/2004: DES x2 to the RCA; IMI with stent occlusion in 02/2005- suboptimal Plavix compliance  . CAD (coronary artery disease)   . Carcinoma of prostate ([facility])    Clopidogrel held for biopsy  . Constipation due to pain medication   . Diabetes mellitus without complication ([facility])   . Diabetic neuropathy ([place] [facility] [place])   . Headache 2021  . Hyperlipidemia   . Hypertension   . Mild depression ([facility])   . Morbid obesity ([facility])   . Myocardial infarction ([facility])   . Shortness of breath dyspnea   . Sleep apnea    no longer uses cpap  . Stroke ([facility]) 2017  . Tobacco abuse    stopped smoking 06/28/09    Past Surgical History:  Procedure Laterality Date  . APPLICATION OF CRANIAL NAVIGATION N/A 07/04/2019   Procedure: APPLICATION OF CRANIAL NAVIGATION;  Surgeon: Ashok Pall, MD;  Location: [facility];  Service: Neurosurgery;  Laterality: N/A;  . BACK SURGERY    . CARDIAC CATHETERIZATION     X 1 stent before having CABG  .  CORONARY ARTERY BYPASS GRAFT  06/28/2008   X4  . CRANIOTOMY Right 07/04/2019   Procedure: Right occipital craniotomy for tumor with brainlab;  Surgeon: Ashok Pall, MD;  Location: [facility];  Service: Neurosurgery;  Laterality: Right;  . ENDARTERECTOMY Right 07/03/2015   Procedure: RIGHT CAROTID ENDARTERECTOMY WITH BOVINE PERICARDIUM PATCH ANGIOPLASTY;  Surgeon: Serafina Mitchell, MD;  Location: [facility];  Service: Vascular;  Laterality: Right;  . LUMBAR LAMINECTOMY/DECOMPRESSION MICRODISCECTOMY N/A 09/06/2014   Procedure: LUMBER DECOMPRESSION L3-5;  Surgeon: Melina Schools, MD;  Location: [facility];  Service: Orthopedics;  Laterality: N/A;  . LUMBAR SPINE SURGERY  2002  . PROSTATE BIOPSY    . PROSTATECTOMY  02/2007  . VASECTOMY      There were no vitals filed for this visit.   Subjective Assessment - 01/16/20 1237    Subjective  Pt arrived in transport chair this day secondary to weakness and feeling weak. Spouse reports patient having difficulty with ambulation today.    Patient is accompanied by: Family member   wife   Pertinent History [name] is a 74 y.o. 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, prostate cancer with radiation  therapy prostatectomy, CAD with CABG maintained on Xarelto, chronic diastolic congestive heart failure.  Lives with spouse two-level home he had been receiving outpatient therapies.  Presented 10/08/2019 with increasing left-sided weakness headache nausea vomiting as well as bouts of hematuria.   CT of the head showed new severe vasogenic edema throughout the posterior right cerebral hemisphere with 8 mm leftNo evidence of acute intracranial abnormality.  Follow-up medical oncology maintained on Decadron therapy.    Limitations Lt homonymous hemianopsia - no driving    Special Tests Pt goes by "[name]"     Patient Stated Goals maximize independence    Currently in Pain? No/denies                Treatment:  Semi Circle peg board - LUE coordination and scanning to the left. Increased time required. Significant decrease in attention to the left this day from last session. Max verbal and visual cues for scanning to the left.   Tabletop scanning for sorting cards - min verbal cues for scanning to the left for sorting. Increased time required.   Assisted with car transfer with spouse. Patient required max A for transfer for lifting assistance and with max verbal cues and tactile cues for hand placement, attention to the left and sequencing into the car this day.        OT Short Term Goals - 01/12/20 1236      OT SHORT TERM GOAL #1   Title Pt to verbalize understanding of visual compensatory strategies to improve daily function including reading and environmental scanning    Time 74    Period Weeks    Status Achieved      OT SHORT TERM GOAL #2   Title Pt to perform tabletop scanning with 90% or greater accuracy using strategies    Baseline 85% for 1.5 M number cancellation    Time 4    Period Weeks    Status On-going   53% accuracy 01/12/20     OT SHORT TERM GOAL #3   Title Pt will read 3/4 page of text with accuracy, visual aids/AE and extra time PRN    Time 4    Period Weeks    Status Achieved   met with larger print double spaced and line guide     OT SHORT TERM GOAL #4   Title Pt will perform dressing with supervision/ set up    Baseline min A for shoes/ socks    Time 4    Period Weeks    Status Achieved   pt supervision with increased time with socks.  supervision with shoes (needs help with orientation). 01/12/20     OT SHORT TERM GOAL #5   Title Pt will demonstrate improved LUE fine motor coordination for ADLs as evidenced by decreasing 9 hole peg test score to 52 secs or less.    Baseline RUE 33 secs, LUE 58.25    Time 4    Period Weeks    Status Achieved    01/12/20 - 49.31 seconds     OT SHORT TERM GOAL #6   Title Pt will demonstrate ability to retrieve a llightweight object at 130 shoulder flexion with LUE demonstrating good control.    Baseline shoulder flexion RUE 140, LUE 120    Time 4    Period Weeks    Status Achieved   11/21/19:  130*            OT Long Term Goals - 01/12/20 1246  OT LONG TERM GOAL #1   Title Pt will perform environmental scanning at 90% accuracy in mod distracting environment    Time 12    Period Weeks    Status New      OT LONG TERM GOAL #2   Title Pt to read full page of text or greater with visual aids/AE prn    Time 12    Period Weeks    Status New      OT LONG TERM GOAL #3   Title Pt will demonstrate improved fine motor coordination in LUE for ADLS as evidenced by decreasing 9 hole peg test score to 48 secs or less.    Baseline Pt will perform all basic ADLS with distant supervision.    Time 12    Period Weeks    Status On-going   LUE 49.31 seconds - 01/12/20     OT LONG TERM GOAL #4   Title Pt will perform basic home management tasks with supervision demonstrating good safety awareness.    Time 12    Period Weeks    Status Deferred   pt is not performing any home management and not appropriate at this time. 01/12/20     OT LONG TERM GOAL #5   Title Pt will negotiate in kitchen with good safety precautions and utilize visual strategies to locate preferred item with supervision. (new goal at renewal)    Time 6    Period Weeks    Status New    Target Date 02/23/20                  Patient will benefit from skilled therapeutic intervention in order to improve the following deficits and impairments:           Visit Diagnosis: Homonymous hemianopsia, left  Visuospatial deficit  Other lack of coordination  Other abnormalities of gait and mobility  Muscle weakness (generalized)  Unsteadiness on feet    Problem List Patient Active Problem List   Diagnosis Date Noted    . Glioblastoma multiforme ([facility]) 10/11/2019  . Dyslipidemia   . Seizure prophylaxis   . Chronic diastolic congestive heart failure ([facility])   . Diabetic peripheral neuropathy ([place] [facility])   . Palliative care by specialist   . Weakness 10/08/2019  . Vasogenic edema ([facility]) 10/08/2019  . Gross hematuria 10/08/2019  . DNR (do not resuscitate) discussion 07/13/2019  . S/P craniotomy 07/04/2019  . Glioblastoma with isocitrate dehydrogenase gene wildtype ([facility] [facility]) 07/04/2019  . Respiratory failure ([facility])   . Encephalopathy acute   . Brain mass 07/01/2019  . Fever 07/01/2019  . Atherosclerosis of native artery of both lower extremities with intermittent claudication ([place]) 08/23/2018  . Coronary artery disease without angina pectoris 08/23/2018  . Dyspnea on exertion 08/23/2018  . Biochemically recurrent malignant neoplasm of prostate 08/16/2018  . S/P carotid endarterectomy 08/20/2015  . Essential hypertension 08/20/2015  . Type 2 diabetes mellitus with circulatory disorder ([facility]) 08/20/2015  . HLD (hyperlipidemia) 08/20/2015  . Carotid artery stenosis, symptomatic 07/03/2015  . Carotid stenosis, right   . TIA (transient ischemic attack) 06/18/2015  . ASCVD (arteriosclerotic cardiovascular disease) 06/18/2015  . Diabetes mellitus without complication ([facility]) 44/04/270  . Resistant hypertension 06/18/2015  . Hyperlipidemia 06/18/2015  . Spinal stenosis of lumbar region with neurogenic claudication 09/06/2014  . Atherosclerosis of native artery of extremity with intermittent claudication ([place]) 03/09/2013  . HYPERLIPIDEMIA 04/08/2009  . TOBACCO ABUSE 04/08/2009  . ATHEROSCLEROTIC CARDIOVASCULAR DISEASE 04/08/2009  . INTERMITTENT VERTIGO 04/08/2009  .  ADENOCARCINOMA, PROSTATE 03/21/2009  . ABDOMINAL PAIN -GENERALIZED 03/21/2009  . History of coronary artery bypass graft 06/28/2008    Zachery Conch MOT, OTR/L  01/16/2020, 2:26 PM  [facility] [address] [place], [place], [address] Phone: [phone]   Fax:  [phone]  Name: [name] MRN: [id number] Date of Birth: [birth year]

## 2020-01-19 ENCOUNTER — Other Ambulatory Visit: Payer: Self-pay

## 2020-01-19 ENCOUNTER — Ambulatory Visit: Payer: Medicare Other | Attending: Neurosurgery

## 2020-01-19 ENCOUNTER — Ambulatory Visit: Payer: Medicare Other | Admitting: Occupational Therapy

## 2020-01-19 ENCOUNTER — Encounter: Payer: Self-pay | Admitting: Occupational Therapy

## 2020-01-19 VITALS — BP 142/86 | HR 81

## 2020-01-19 DIAGNOSIS — M6281 Muscle weakness (generalized): Secondary | ICD-10-CM

## 2020-01-19 DIAGNOSIS — R2681 Unsteadiness on feet: Secondary | ICD-10-CM | POA: Diagnosis not present

## 2020-01-19 DIAGNOSIS — R41844 Frontal lobe and executive function deficit: Secondary | ICD-10-CM | POA: Diagnosis not present

## 2020-01-19 DIAGNOSIS — R41840 Attention and concentration deficit: Secondary | ICD-10-CM | POA: Diagnosis not present

## 2020-01-19 DIAGNOSIS — G9340 Encephalopathy, unspecified: Secondary | ICD-10-CM

## 2020-01-19 DIAGNOSIS — R41842 Visuospatial deficit: Secondary | ICD-10-CM

## 2020-01-19 DIAGNOSIS — H53462 Homonymous bilateral field defects, left side: Secondary | ICD-10-CM

## 2020-01-19 DIAGNOSIS — C719 Malignant neoplasm of brain, unspecified: Secondary | ICD-10-CM | POA: Diagnosis not present

## 2020-01-19 DIAGNOSIS — R278 Other lack of coordination: Secondary | ICD-10-CM | POA: Diagnosis not present

## 2020-01-19 DIAGNOSIS — R42 Dizziness and giddiness: Secondary | ICD-10-CM | POA: Diagnosis not present

## 2020-01-19 DIAGNOSIS — R2689 Other abnormalities of gait and mobility: Secondary | ICD-10-CM | POA: Diagnosis not present

## 2020-01-19 NOTE — Therapy (Signed)
[facility] [address] [place], [place], [address] Phone: [phone]   Fax:  [phone]  Physical Therapy Progress Note  Patient Details  Name: [name] MRN: [id number] Date of Birth: [birth year] Referring Provider (PT): [name]   Encounter Date: 01/19/2020   PT End of Session - 01/19/20 1355    Visit Number 20    Number of Visits 26    Date for PT Re-Evaluation 02/13/20    Authorization Type 10 V PN on 08/13/81; recert 08/06/60 (2x/wek for 4 weeks, 1x/week for 4 more weeks - total 8 weeks), 20V PN on 01/19/20    Progress Note Due on Visit 28    PT Start Time 1315    PT Stop Time 1400    PT Time Calculation (min) 45 min    Equipment Utilized During Treatment Gait belt    Activity Tolerance Patient tolerated treatment well;Patient limited by fatigue    Behavior During Therapy WFL for tasks assessed/performed           Past Medical History:  Diagnosis Date  . Anxiety   . Arthritis   . ASCVD (arteriosclerotic cardiovascular disease)    03/2004: DES x2 to the RCA; IMI with stent occlusion in 02/2005- suboptimal Plavix compliance  . CAD (coronary artery disease)   . Carcinoma of prostate ([place])    Clopidogrel held for biopsy  . Constipation due to pain medication   . Diabetes mellitus without complication ([place])   . Diabetic neuropathy ([place])   . Headache 2021  . Hyperlipidemia   . Hypertension   . Mild depression ([place])   . Morbid obesity ([place])   . Myocardial infarction ([place])   . Shortness of breath dyspnea   . Sleep apnea    no longer uses cpap  . Stroke ([place]) 2017  . Tobacco abuse    stopped smoking 06/28/09    Past Surgical History:  Procedure Laterality Date  . APPLICATION OF CRANIAL NAVIGATION N/A 07/04/2019   Procedure: APPLICATION OF CRANIAL NAVIGATION;  Surgeon: Ashok Pall, MD;  Location: [facility];  Service: Neurosurgery;  Laterality: N/A;  . BACK SURGERY    . CARDIAC CATHETERIZATION       X 1 stent before having CABG  . CORONARY ARTERY BYPASS GRAFT  06/28/2008   X4  . CRANIOTOMY Right 07/04/2019   Procedure: Right occipital craniotomy for tumor with brainlab;  Surgeon: Ashok Pall, MD;  Location: [facility];  Service: Neurosurgery;  Laterality: Right;  . ENDARTERECTOMY Right 07/03/2015   Procedure: RIGHT CAROTID ENDARTERECTOMY WITH BOVINE PERICARDIUM PATCH ANGIOPLASTY;  Surgeon: Serafina Mitchell, MD;  Location: [facility];  Service: Vascular;  Laterality: Right;  . LUMBAR LAMINECTOMY/DECOMPRESSION MICRODISCECTOMY N/A 09/06/2014   Procedure: LUMBER DECOMPRESSION L3-5;  Surgeon: Melina Schools, MD;  Location: [facility];  Service: Orthopedics;  Laterality: N/A;  . LUMBAR SPINE SURGERY  2002  . PROSTATE BIOPSY    . PROSTATECTOMY  02/2007  . VASECTOMY      Vitals:   01/19/20 1321  BP: (!) 142/86  Pulse: 81     Subjective Assessment - 01/19/20 1320    Subjective Wife reports that he is not doing too well since they started weaning off the steroids. He is starting to have left sided neglect and also some memory issues. He had big dinner last night and didn't remember if he had eaten 1 hour afterwards.    Patient is accompained by: Family member    Pertinent History PMH: Rt  occipital craniotomy 07/04/19 secondary to glioblastoma. PMH: Prostate CA, CVA 2017 (no residual deficits), CAD, DM, HTN, hx of 2 lumbar surgeries 2002, 2016 (laminectomies)    Limitations Standing;Walking    How long can you sit comfortably? no issues    How long can you stand comfortably? 5 min    How long can you walk comfortably? 5 min    Patient Stated Goals walk better                Gait training: 1 x 115' with RW and CGA Standing ball toss and catch: 20x, intermittent walker support with 1 UE  Standing OH reach with 1 UE support on walker: 10x Standing doorframe "V" stretch: 5 x 10" holds Gait training: 4 x 40' with CGA from one chair to other. Practicing turning 180 deg before sitting  down                        PT Short Term Goals - 01/09/20 1412      PT SHORT TERM GOAL #1   Title Pt will be able to perform 5x sit to stand under 30 seconds with bil UE to improve functional strength from standard chair.    Baseline Pt unable to perform them (10/24/19); unable to without UE support (11/24/19); attempted with one UE but pt only able to complete 1 rep with multiple tries; pt was able to stand up using one UE on chair and second hand on walker (12/12/19); pt able to complete 3 reps with R UE on chair and L UE on walker but unable to complete 5 reps (12/19/19); bil UE use 42 seconds; bil UE use 45 seconds (01/09/20)    Time 4    Period Weeks    Status Revised    Target Date 01/16/20      PT SHORT TERM GOAL #2   Title Patient will be able to ambulate for 10 min without rest with use of his walker to improve walking endurnace    Baseline <5 min (10/24/19); 5-10 min (11/29/19);    Time 4    Period Weeks    Status Achieved    Target Date 11/21/19      PT SHORT TERM GOAL #3   Title Pt will demo 4 points improvement on Berg balance scale to improve overall static balance    Baseline 37/56 (10/24/19); 8/6 42/56; 01/09/20 46/56    Time 4    Period Weeks    Status Achieved    Target Date 11/21/19             PT Long Term Goals - 01/09/20 1431      PT LONG TERM GOAL #1   Title Pt will demo 47/56 or better on BBS to improve balance and reduce fall risk    Baseline 37/26 (10/24/19);11/24/19 42/56; 12/19/19 47/56    Time 8    Period Weeks    Status Achieved      PT LONG TERM GOAL #2   Title Patient will be able to perform sit to stand in under 25 seconds to improve overall strength    Baseline bil UE 42 seconds (12/19/19)    Time 8    Period Weeks    Status Revised      PT LONG TERM GOAL #3   Title Patient will be able to ambulate 420' with small based quad cane and SBA to improve short distance ambulation    Baseline quad  cane 115' (12/19/19)    Time 8     Period Weeks    Status Revised      PT LONG TERM GOAL #4   Title Pt will demo improvement by 0.15 m/s with use of st. cane to improve functional gait velocity    Baseline 0.76m/s with cane, 0.13m/s with rolling walker (eval)    Time 8    Status Revised                 Plan - 01/19/20 1350    Clinical Impression Statement Overall progress: Pt has been seen for total of 20 sessions from 10/24/19 to 01/19/20 for gait and balance disorder. Patient's progress in therapy has been fluctuating due to his medication changes. Pt rencently is weaning off from the steroids and that is affecting his overall strength, balance and endurance. Patient is starting to show mild left sided neglect since starting weaning off schedules from steorids and needs increased verbal and tactile cueing for safe transfers.  Session findings:  Pt demonstrated more fatigued then previous sessions. While he is weaning of steroids, he is demonstrating decreased strength and endurance with exercises. Pt requires multiple cues for safe turning. Pt was educated and emphasized that we are only using quad cane to improve LE use during gait in PT only. He should only use RW at home and in community for safety.    Personal Factors and Comorbidities Comorbidity 3+    Comorbidities craniotomy secondary to glioblastoma, hx of lumbar surgeries x 2, CABG x 4    Examination-Activity Limitations Squat;Stairs;Locomotion Level;Caring for Others;Carry    Examination-Participation Restrictions Cleaning;Community Activity;Driving;Laundry;Yard Work;Shop    Stability/Clinical Decision Making Unstable/Unpredictable    Rehab Potential Good    PT Frequency Other (comment)   2x week for 4 weeks and 1x/week for 4 more weeks for total of 8 weeks.   PT Duration 8 weeks    PT Treatment/Interventions ADLs/Self Care Home Management;Gait training;Stair training;Functional mobility training;Therapeutic activities;Therapeutic exercise;Balance  training;Neuromuscular re-education;Vestibular;Visual/perceptual remediation/compensation;Manual techniques;Energy conservation;Joint Manipulations;Patient/family education;Passive range of motion    PT Next Visit Plan Focus on SLS, forward reaching, picking object from floor, turning 360 to improve BBS    PT Home Exercise Plan Access Code: [email or web address]    Consulted and Agree with Plan of Care Patient;Family member/caregiver    Family Member Consulted wife           Patient will benefit from skilled therapeutic intervention in order to improve the following deficits and impairments:  Abnormal gait, Decreased activity tolerance, Decreased balance, Decreased mobility, Decreased endurance, Difficulty walking, Dizziness, Impaired vision/preception  Visit Diagnosis: Homonymous hemianopsia, left  Other abnormalities of gait and mobility  Muscle weakness (generalized)  Unsteadiness on feet  Dizziness and giddiness  Encephalopathy acute  Glioblastoma multiforme (HCC)     Problem List Patient Active Problem List   Diagnosis Date Noted  . 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 Dyslipidemia   . Seizure prophylaxis   . Chronic diastolic congestive heart failure ([place])   . Diabetic peripheral neuropathy ([place])   . Palliative care by specialist   . Weakness 10/08/2019  . Vasogenic edema ([place]) 10/08/2019  . Gross hematuria 10/08/2019  . DNR (do not resuscitate) discussion 07/13/2019  . S/P craniotomy 07/04/2019  . 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 Respiratory failure ([place])   . Encephalopathy acute   . Brain mass 07/01/2019  . Fever 07/01/2019  . Atherosclerosis of native artery of both lower extremities with intermittent  claudication ([place]) 08/23/2018  . Coronary artery disease without angina pectoris 08/23/2018  . Dyspnea on exertion 08/23/2018  . Biochemically recurrent malignant neoplasm of prostate ([place]) 08/16/2018  . S/P carotid  endarterectomy 08/20/2015  . Essential hypertension 08/20/2015  . 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 HLD (hyperlipidemia) 08/20/2015  . Carotid artery stenosis, symptomatic 07/03/2015  . Carotid stenosis, right   . TIA (transient ischemic attack) 06/18/2015  . ASCVD (arteriosclerotic cardiovascular disease) 06/18/2015  . Diabetes mellitus without complication ([place]) 81/04/7508  . Resistant hypertension 06/18/2015  . Hyperlipidemia 06/18/2015  . Spinal stenosis of lumbar region with neurogenic claudication 09/06/2014  . Atherosclerosis of native artery of extremity with intermittent claudication ([place]) 03/09/2013  . HYPERLIPIDEMIA 04/08/2009  . TOBACCO ABUSE 04/08/2009  . ATHEROSCLEROTIC CARDIOVASCULAR DISEASE 04/08/2009  . INTERMITTENT VERTIGO 04/08/2009  . ADENOCARCINOMA, PROSTATE 03/21/2009  . ABDOMINAL PAIN -GENERALIZED 03/21/2009  . History of coronary artery bypass graft 06/28/2008    Kerrie Pleasure 01/19/2020, 3:00 PM  [facility] [address] [place], [place], [address] Phone: [phone]   Fax:  [phone]  Name: HAZE ANTILLON MRN: [id number] Date of Birth: 03/18/1946

## 2020-01-19 NOTE — Therapy (Signed)
[name] [address] [place], [place], [address] Phone: [phone]   Fax:  [phone]  Occupational Therapy Treatment  Patient Details  Name: [name] MRN: [id number] Date of Birth: 1945/04/27 Referring Provider (OT): Dr. Lise [name]   Encounter Date: 01/19/2020   OT End of Session - 01/19/20 1240    Visit Number 21    Number of Visits 25    Date for OT Re-Evaluation 02/23/20    Authorization Type MCR primary, AARP secondary    Authorization Time Period 90 days, may d/c after 8 weeks dep on progress.    Authorization - Visit Number 21    Progress Note Due on Visit 29    OT Start Time 1237    OT Stop Time 1315    OT Time Calculation (min) 38 min    Activity Tolerance Patient tolerated treatment well    Behavior During Therapy WFL for tasks assessed/performed           Past Medical History:  Diagnosis Date  . Anxiety   . Arthritis   . ASCVD (arteriosclerotic cardiovascular disease)    03/2004: DES x2 to the RCA; IMI with stent occlusion in 02/2005- suboptimal Plavix compliance  . CAD (coronary artery disease)   . Carcinoma of prostate ([facility])    Clopidogrel held for biopsy  . Constipation due to pain medication   . Diabetes mellitus without complication (H[facility])   . Diabetic neuropathy (H[facility])   . Headache 2021  . Hyperlipidemia   . Hypertension   . Mild depression ([facility])   . Morbid obesity ([facility])   . Myocardial infarction ([facility])   . Shortness of breath dyspnea   . Sleep apnea    no longer uses cpap  . Stroke ([facility]) 2017  . Tobacco abuse    stopped smoking 06/28/09    Past Surgical History:  Procedure Laterality Date  . APPLICATION OF CRANIAL NAVIGATION N/A 07/04/2019   Procedure: APPLICATION OF CRANIAL NAVIGATION;  Surgeon: Mark Memos, MD;  [facility];  Service: Neurosurgery;  Laterality: N/A;  . BACK SURGERY    . CARDIAC CATHETERIZATION     X 1 stent before having CABG  .  CORONARY ARTERY BYPASS GRAFT  06/28/2008   X4  . CRANIOTOMY Right 07/04/2019   Procedure: Right occipital craniotomy for tumor with brainlab;  Surgeon: Mark Memos, MD;  Location[facility];  Service: Neurosurgery;  Laterality: Right;  . ENDARTERECTOMY Right 07/03/2015   Procedure: RIGHT CAROTID ENDARTERECTOMY WITH BOVINE PERICARDIUM PATCH ANGIOPLASTY;  Surgeon: Mark Libman, MD;  [facility];  Service: Vascular;  Laterality: Right;  . LUMBAR LAMINECTOMY/DECOMPRESSION MICRODISCECTOMY N/A 09/06/2014   Procedure: LUMBER DECOMPRESSION L3-5;  Surgeon: Mark Lick, MD;  [facility];  Service: Orthopedics;  Laterality: N/A;  . LUMBAR SPINE SURGERY  2002  . PROSTATE BIOPSY    . PROSTATECTOMY  02/2007  . VASECTOMY      There were no vitals filed for this visit.   Subjective Assessment - 01/19/20 1239    Subjective  Pt denis any pain. Pt reports not being tired but presents like he is more fatigued and spouse reports he has been very tired.    Patient is accompanied by: Family member   wife   Pertinent History [name] is a 74 y.o. 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 Barbaraann [name], prostate  cancer with radiation therapy prostatectomy, CAD with CABG maintained on Xarelto, chronic diastolic congestive heart failure.  Lives with spouse two-level home he had been receiving outpatient therapies.  Presented 10/08/2019 with increasing left-sided weakness headache nausea vomiting as well as bouts of hematuria.   CT of the head showed new severe vasogenic edema throughout the posterior right cerebral hemisphere with 8 mm leftNo evidence of acute intracranial abnormality.  Follow-up medical oncology maintained on Decadron therapy.    Limitations Lt homonymous hemianopsia - no driving    Special Tests Pt goes by "[name]"    Patient Stated  Goals maximize independence    Currently in Pain? No/denies              Treatment: Analog/Digital Clock/Times - mod/max verbal and visual cues for scanning to the left this day. 9 matches in approximately 20 minutes this day with max cues.   Medium Peg Board design with coordination with LUE and visual scanning and problem solving. Minimally complex pattern required moderate to maximal assistance for following pattern.       OT Short Term Goals - 01/12/20 1236      OT SHORT TERM GOAL #1   Title Pt to verbalize understanding of visual compensatory strategies to improve daily function including reading and environmental scanning    Time 4    Period Weeks    Status Achieved      OT SHORT TERM GOAL #2   Title Pt to perform tabletop scanning with 90% or greater accuracy using strategies    Baseline 85% for 1.5 M number cancellation    Time 4    Period Weeks    Status On-going   53% accuracy 01/12/20     OT SHORT TERM GOAL #3   Title Pt will read 3/4 page of text with accuracy, visual aids/AE and extra time PRN    Time 4    Period Weeks    Status Achieved   met with larger print double spaced and line guide     OT SHORT TERM GOAL #4   Title Pt will perform dressing with supervision/ set up    Baseline min A for shoes/ socks    Time 4    Period Weeks    Status Achieved   pt supervision with increased time with socks.  supervision with shoes (needs help with orientation). 01/12/20     OT SHORT TERM GOAL #5   Title Pt will demonstrate improved LUE fine motor coordination for ADLs as evidenced by decreasing 9 hole peg test score to 52 secs or less.    Baseline RUE 33 secs, LUE 58.25    Time 4    Period Weeks    Status Achieved   01/12/20 - 49.31 seconds     OT SHORT TERM GOAL #6   Title Pt will demonstrate ability to retrieve a llightweight object at 130 shoulder flexion with LUE demonstrating good control.    Baseline shoulder flexion RUE 140, LUE 120    Time 4    Period  Weeks    Status Achieved   11/21/19:  130*            OT Long Term Goals - 01/12/20 1246      OT LONG TERM GOAL #1   Title Pt will perform environmental scanning at 90% accuracy in mod distracting environment    Time 12    Period Weeks    Status New      OT LONG  TERM GOAL #2   Title Pt to read full page of text or greater with visual aids/AE prn    Time 12    Period Weeks    Status New      OT LONG TERM GOAL #3   Title Pt will demonstrate improved fine motor coordination in LUE for ADLS as evidenced by decreasing 9 hole peg test score to 48 secs or less.    Baseline Pt will perform all basic ADLS with distant supervision.    Time 12    Period Weeks    Status On-going   LUE 49.31 seconds - 01/12/20     OT LONG TERM GOAL #4   Title Pt will perform basic home management tasks with supervision demonstrating good safety awareness.    Time 12    Period Weeks    Status Deferred   pt is not performing any home management and not appropriate at this time. 01/12/20     OT LONG TERM GOAL #5   Title Pt will negotiate in kitchen with good safety precautions and utilize visual strategies to locate preferred item with supervision. (new goal at renewal)    Time 6    Period Weeks    Status New    Target Date 02/23/20                 Plan - 01/19/20 1240    Clinical Impression Statement Pt with slightly increased strength and endurance than last session. Pt demonstrates decrease in attention to left as evidenced by forgetting about LLE with transfers and decreased attention to the left visually.    OT Occupational Profile and History Detailed Assessment- Review of Records and additional review of physical, cognitive, psychosocial history related to current functional performance    Occupational performance deficits (Please refer to evaluation for details): IADL's;Work;Leisure;Social Participation;ADL's    Body Structure / Function / Physical Skills  IADL;Endurance;Balance;Vision;ADL;Mobility;Strength;UE functional use;FMC;Coordination;Gait;GMC;ROM;Decreased knowledge of use of DME;Dexterity    Cognitive Skills Memory;Problem Solve;Perception;Thought;Safety Awareness    Rehab Potential Good    Comorbidities impacting occupational performance description: glioblastoma - pt has begun radiation    OT Frequency 1x / week   2x/week for 2 weeks and 1x/week for 4 weeks   OT Duration 6 weeks    OT Treatment/Interventions Self-care/ADL training;Functional Mobility Training;Therapeutic activities;Coping strategies training;Visual/perceptual remediation/compensation;Patient/family education;Cognitive remediation/compensation;Therapeutic exercise;Balance training;Manual Therapy;Neuromuscular education;Aquatic Therapy;Energy conservation;DME and/or AE instruction;Paraffin;Fluidtherapy;Gait Training;Passive range of motion;Moist Heat    Plan tabletop scanning, walker safety and negotiation if feeling better    Consulted and Agree with Plan of Care Patient;Family member/caregiver    Family Member Consulted wife           Patient will benefit from skilled therapeutic intervention in order to improve the following deficits and impairments:   Body Structure / Function / Physical Skills: IADL, Endurance, Balance, Vision, ADL, Mobility, Strength, UE functional use, FMC, Coordination, Gait, GMC, ROM, Decreased knowledge of use of DME, Dexterity Cognitive Skills: Memory, Problem Solve, Perception, Thought, Safety Awareness     Visit Diagnosis: Visuospatial deficit  Homonymous hemianopsia, left  Other lack of coordination  Other abnormalities of gait and mobility  Muscle weakness (generalized)    Problem List Patient Active Problem List   Diagnosis Date Noted  . Glioblastoma multiforme ([place]) 10/11/2019  . Dyslipidemia   . Seizure prophylaxis   . Chronic diastolic congestive heart failure ([place])   . Diabetic peripheral neuropathy ([place])   .  Palliative care by specialist   . Weakness  10/08/2019  . Vasogenic edema ([place]) 10/08/2019  . Gross hematuria 10/08/2019  . DNR (do not resuscitate) discussion 07/13/2019  . S/P craniotomy 07/04/2019  . 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 Respiratory failure ([place])   . Encephalopathy acute   . Brain mass 07/01/2019  . Fever 07/01/2019  . Atherosclerosis of native artery of both lower extremities with intermittent claudication ([place]) 08/23/2018  . Coronary artery disease without angina pectoris 08/23/2018  . Dyspnea on exertion 08/23/2018  . Biochemically recurrent malignant neoplasm of prostate ([place]) 08/16/2018  . S/P carotid endarterectomy 08/20/2015  . Essential hypertension 08/20/2015  . Type 2 diabetes mellitus with circulatory disorder ([place]) 08/20/2015  . HLD (hyperlipidemia) 08/20/2015  . Carotid artery stenosis, symptomatic 07/03/2015  . Carotid stenosis, right   . TIA (transient ischemic attack) 06/18/2015  . ASCVD (arteriosclerotic cardiovascular disease) 06/18/2015  . Diabetes mellitus without complication ([place]) 74/11/1446  . Resistant hypertension 06/18/2015  . Hyperlipidemia 06/18/2015  . Spinal stenosis of lumbar region with neurogenic claudication 09/06/2014  . Atherosclerosis of native artery of extremity with intermittent claudication ([place]) 03/09/2013  . HYPERLIPIDEMIA 04/08/2009  . TOBACCO ABUSE 04/08/2009  . ATHEROSCLEROTIC CARDIOVASCULAR DISEASE 04/08/2009  . INTERMITTENT VERTIGO 04/08/2009  . ADENOCARCINOMA, PROSTATE 03/21/2009  . ABDOMINAL PAIN -GENERALIZED 03/21/2009  . History of coronary artery bypass graft 06/28/2008    Zachery Conch MOT, OTR/L  01/19/2020, 1:21 PM  [facility] [address] [place], [place], [address] Phone: [phone]   Fax:  [phone]  Name: [name] MRN: [id number] Date of Birth: 09-Aug-1945

## 2020-01-23 ENCOUNTER — Telehealth: Payer: Self-pay

## 2020-01-23 ENCOUNTER — Ambulatory Visit: Payer: Medicare Other

## 2020-01-23 ENCOUNTER — Other Ambulatory Visit: Payer: Self-pay

## 2020-01-23 ENCOUNTER — Ambulatory Visit: Payer: Medicare Other | Admitting: Occupational Therapy

## 2020-01-23 ENCOUNTER — Encounter: Payer: Self-pay | Admitting: Occupational Therapy

## 2020-01-23 DIAGNOSIS — R2681 Unsteadiness on feet: Secondary | ICD-10-CM

## 2020-01-23 DIAGNOSIS — R278 Other lack of coordination: Secondary | ICD-10-CM

## 2020-01-23 DIAGNOSIS — R2689 Other abnormalities of gait and mobility: Secondary | ICD-10-CM | POA: Diagnosis not present

## 2020-01-23 DIAGNOSIS — M6281 Muscle weakness (generalized): Secondary | ICD-10-CM

## 2020-01-23 DIAGNOSIS — R42 Dizziness and giddiness: Secondary | ICD-10-CM | POA: Diagnosis not present

## 2020-01-23 DIAGNOSIS — H53462 Homonymous bilateral field defects, left side: Secondary | ICD-10-CM

## 2020-01-23 DIAGNOSIS — C719 Malignant neoplasm of brain, unspecified: Secondary | ICD-10-CM

## 2020-01-23 DIAGNOSIS — R41842 Visuospatial deficit: Secondary | ICD-10-CM

## 2020-01-23 DIAGNOSIS — G9340 Encephalopathy, unspecified: Secondary | ICD-10-CM | POA: Diagnosis not present

## 2020-01-23 NOTE — Therapy (Signed)
[facility] [address] [place], [place], [address] Phone: [phone]   Fax:  [phone]  Occupational Therapy Treatment  Patient Details  Name: [name] MRN: [id number] Date of Birth: 10-31-1945 Referring Provider (OT): Dr. Bertis Ruddy   Encounter Date: 01/23/2020   OT End of Session - 01/23/20 1235    Visit Number 22    Number of Visits 25    Date for OT Re-Evaluation 02/23/20    Authorization Type MCR primary, AARP secondary    Authorization Time Period 90 days, may d/c after 8 weeks dep on progress.    Authorization - Visit Number 22    Progress Note Due on Visit 50    OT Start Time 1234    OT Stop Time 1315    OT Time Calculation (min) 41 min    Activity Tolerance Patient tolerated treatment well    Behavior During Therapy WFL for tasks assessed/performed           Past Medical History:  Diagnosis Date  . Anxiety   . Arthritis   . ASCVD (arteriosclerotic cardiovascular disease)    03/2004: DES x2 to the RCA; IMI with stent occlusion in 02/2005- suboptimal Plavix compliance  . CAD (coronary artery disease)   . Carcinoma of prostate ([place])    Clopidogrel held for biopsy  . Constipation due to pain medication   . Diabetes mellitus without complication ([place])   . Diabetic neuropathy ([facility])   . Headache 2021  . Hyperlipidemia   . Hypertension   . Mild depression ([place])   . Morbid obesity ([place])   . Myocardial infarction ([place])   . Shortness of breath dyspnea   . Sleep apnea    no longer uses cpap  . Stroke ([place]) 2017  . Tobacco abuse    stopped smoking 06/28/09    Past Surgical History:  Procedure Laterality Date  . APPLICATION OF CRANIAL NAVIGATION N/A 07/04/2019   Procedure: APPLICATION OF CRANIAL NAVIGATION;  Surgeon: Ashok Pall, MD;  Location: [facility];  Service: Neurosurgery;  Laterality: N/A;  . BACK SURGERY    . CARDIAC CATHETERIZATION     X 1 stent before having CABG  .  CORONARY ARTERY BYPASS GRAFT  06/28/2008   X4  . CRANIOTOMY Right 07/04/2019   Procedure: Right occipital craniotomy for tumor with brainlab;  Surgeon: Ashok Pall, MD;  Location: [facility];  Service: Neurosurgery;  Laterality: Right;  . ENDARTERECTOMY Right 07/03/2015   Procedure: RIGHT CAROTID ENDARTERECTOMY WITH BOVINE PERICARDIUM PATCH ANGIOPLASTY;  Surgeon: Serafina Mitchell, MD;  Location: [facility];  Service: Vascular;  Laterality: Right;  . LUMBAR LAMINECTOMY/DECOMPRESSION MICRODISCECTOMY N/A 09/06/2014   Procedure: LUMBER DECOMPRESSION L3-5;  Surgeon: Melina Schools, MD;  Location: [facility];  Service: Orthopedics;  Laterality: N/A;  . LUMBAR SPINE SURGERY  2002  . PROSTATE BIOPSY    . PROSTATECTOMY  02/2007  . VASECTOMY      There were no vitals filed for this visit.   Subjective Assessment - 01/23/20 1236    Subjective  Pt denies any pain. Says he's a little tired after walking but not too bad. Reports no changes at home.    Patient is accompanied by: Family member   wife   Pertinent History [name] is a 74 y.o. 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, prostate cancer with radiation therapy  prostatectomy, CAD with CABG maintained on Xarelto, chronic diastolic congestive heart failure.  Lives with spouse two-level home he had been receiving outpatient therapies.  Presented 10/08/2019 with increasing left-sided weakness headache nausea vomiting as well as bouts of hematuria.   CT of the head showed new severe vasogenic edema throughout the posterior right cerebral hemisphere with 8 mm leftNo evidence of acute intracranial abnormality.  Follow-up medical oncology maintained on Decadron therapy.    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 No/denies             Treatment:  Perfection - mod cues for correcting errors and scanning to edge of board to the left.  Tabletop Scanning - Number cancellation 88's 57.5% on first attempt without cues for completion; 2nd attempt with cues for strategies for Left to Right and making marks for left margin and following lines. Pt did well for approx 25% of the page but then abandoned strategies and missed left side. Navigation with 2 wheel rolling walker with close supervision and verbal cues for safety and attending to left.       OT Short Term Goals - 01/12/20 1236      OT SHORT TERM GOAL #1   Title Pt to verbalize understanding of visual compensatory strategies to improve daily function including reading and environmental scanning    Time 4    Period Weeks    Status Achieved      OT SHORT TERM GOAL #2   Title Pt to perform tabletop scanning with 90% or greater accuracy using strategies    Baseline 85% for 1.5 M number cancellation    Time 4    Period Weeks    Status On-going   53% accuracy 01/12/20     OT SHORT TERM GOAL #3   Title Pt will read 3/4 page of text with accuracy, visual aids/AE and extra time PRN    Time 4    Period Weeks    Status Achieved   met with larger print double spaced and line guide     OT SHORT TERM GOAL #4   Title Pt will perform dressing with supervision/ set up    Baseline min A for shoes/ socks    Time 4    Period Weeks    Status Achieved   pt supervision with increased time with socks.  supervision with shoes (needs help with orientation). 01/12/20     OT SHORT TERM GOAL #5   Title Pt will demonstrate improved LUE fine motor coordination for ADLs as evidenced by decreasing 9 hole peg test score to 52 secs or less.    Baseline RUE 33 secs, LUE 58.25    Time 4    Period Weeks    Status Achieved   01/12/20 - 49.31 seconds     OT SHORT TERM GOAL #6   Title Pt will demonstrate ability to retrieve a llightweight object  at 130 shoulder flexion with LUE demonstrating good control.    Baseline shoulder flexion RUE 140, LUE 120    Time 4    Period Weeks    Status Achieved   11/21/19:  130*            OT Long Term Goals - 01/12/20 1246      OT LONG TERM GOAL #1   Title Pt will perform environmental scanning at  90% accuracy in mod distracting environment    Time 12    Period Weeks    Status New      OT LONG TERM GOAL #2   Title Pt to read full page of text or greater with visual aids/AE prn    Time 12    Period Weeks    Status New      OT LONG TERM GOAL #3   Title Pt will demonstrate improved fine motor coordination in LUE for ADLS as evidenced by decreasing 9 hole peg test score to 48 secs or less.    Baseline Pt will perform all basic ADLS with distant supervision.    Time 12    Period Weeks    Status On-going   LUE 49.31 seconds - 01/12/20     OT LONG TERM GOAL #4   Title Pt will perform basic home management tasks with supervision demonstrating good safety awareness.    Time 12    Period Weeks    Status Deferred   pt is not performing any home management and not appropriate at this time. 01/12/20     OT LONG TERM GOAL #5   Title Pt will negotiate in kitchen with good safety precautions and utilize visual strategies to locate preferred item with supervision. (new goal at renewal)    Time 6    Period Weeks    Status New    Target Date 02/23/20                 Plan - 01/23/20 1237    Clinical Impression Statement Pt with slightly increased strength and endurance than last session. Pt demonstrates decreased safety awareness with ambulation and negotiation with RW around clinic.    OT Occupational Profile and History Detailed Assessment- Review of Records and additional review of physical, cognitive, psychosocial history related to current functional performance    Occupational performance deficits (Please refer to evaluation for details): IADL's;Work;Leisure;Social Participation;ADL's     Body Structure / Function / Physical Skills IADL;Endurance;Balance;Vision;ADL;Mobility;Strength;UE functional use;FMC;Coordination;Gait;GMC;ROM;Decreased knowledge of use of DME;Dexterity    Cognitive Skills Memory;Problem Solve;Perception;Thought;Safety Awareness    Rehab Potential Good    Comorbidities impacting occupational performance description: glioblastoma - pt has begun radiation    OT Frequency 1x / week   2x/week for 2 weeks and 1x/week for 4 weeks   OT Duration 6 weeks    OT Treatment/Interventions Self-care/ADL training;Functional Mobility Training;Therapeutic activities;Coping strategies training;Visual/perceptual remediation/compensation;Patient/family education;Cognitive remediation/compensation;Therapeutic exercise;Balance training;Manual Therapy;Neuromuscular education;Aquatic Therapy;Energy conservation;DME and/or AE instruction;Paraffin;Fluidtherapy;Gait Training;Passive range of motion;Moist Heat    Plan safety awareness, kitchen mobility and environmental scanning    Consulted and Agree with Plan of Care Patient;Family member/caregiver    Family Member Consulted wife           Patient will benefit from skilled therapeutic intervention in order to improve the following deficits and impairments:   Body Structure / Function / Physical Skills: IADL, Endurance, Balance, Vision, ADL, Mobility, Strength, UE functional use, FMC, Coordination, Gait, GMC, ROM, Decreased knowledge of use of DME, Dexterity Cognitive Skills: Memory, Problem Solve, Perception, Thought, Safety Awareness     Visit Diagnosis: Visuospatial deficit  Homonymous hemianopsia, left  Other lack of coordination  Other abnormalities of gait and mobility  Muscle weakness (generalized)  Unsteadiness on feet    Problem List Patient Active Problem List   Diagnosis Date Noted  . Glioblastoma multiforme (HCC) 10/11/2019  . Dyslipidemia   . Seizure prophylaxis   . Chronic diastolic congestive heart  failure ([facility])   . Diabetic peripheral neuropathy ([facility])   . Palliative care by specialist   . Weakness 10/08/2019  . Vasogenic edema ([facility]) 10/08/2019  . Gross hematuria 10/08/2019  . DNR (do not resuscitate) discussion 07/13/2019  . S/P craniotomy 07/04/2019  . Glioblastoma with isocitrate dehydrogenase gene wildtype ([facility]) 07/04/2019  . Respiratory failure ([place])   . Encephalopathy acute   . Brain mass 07/01/2019  . Fever 07/01/2019  . Atherosclerosis of native artery of both lower extremities with intermittent claudication ([facility]) 08/23/2018  . Coronary artery disease without angina pectoris 08/23/2018  . Dyspnea on exertion 08/23/2018  . Biochemically recurrent malignant neoplasm of prostate ([facility]) 08/16/2018  . S/P carotid endarterectomy 08/20/2015  . Essential hypertension 08/20/2015  . Type 2 diabetes mellitus with circulatory disorder ([facility]) 08/20/2015  . HLD (hyperlipidemia) 08/20/2015  . Carotid artery stenosis, symptomatic 07/03/2015  . Carotid stenosis, right   . TIA (transient ischemic attack) 06/18/2015  . ASCVD (arteriosclerotic cardiovascular disease) 06/18/2015  . Diabetes mellitus without complication ([facility]) 80/20/8910  . Resistant hypertension 06/18/2015  . Hyperlipidemia 06/18/2015  . Spinal stenosis of lumbar region with neurogenic claudication 09/06/2014  . Atherosclerosis of native artery of extremity with intermittent claudication ([facility]) 03/09/2013  . HYPERLIPIDEMIA 04/08/2009  . TOBACCO ABUSE 04/08/2009  . ATHEROSCLEROTIC CARDIOVASCULAR DISEASE 04/08/2009  . INTERMITTENT VERTIGO 04/08/2009  . ADENOCARCINOMA, PROSTATE 03/21/2009  . ABDOMINAL PAIN -GENERALIZED 03/21/2009  . History of coronary artery bypass graft 06/28/2008    Zachery Conch MOT, OTR/L  01/23/2020, 1:39 PM  [facility] [address] [place], [place], [address] Phone: [phone]   Fax:  [phone]  Name: [name] MRN: [id number] Date of Birth: 1945/09/01

## 2020-01-23 NOTE — Therapy (Signed)
[facility] [address] [place], [place], [address] Phone: [phone]   Fax:  [phone]  Physical Therapy Treatment  Patient Details  Name: [name] MRN: [id number] Date of Birth: 30-Aug-1945 Referring Provider (PT): [name]   Encounter Date: 01/23/2020   PT End of Session - 01/23/20 1303    Visit Number 21    Number of Visits 26    Date for PT Re-Evaluation 02/13/20    Authorization Type 10 V PN on 2/83/66; recert 2/94/76 (2x/wek for 4 weeks, 1x/week for 4 more weeks - total 8 weeks), 20V PN on 01/19/20    Progress Note Due on Visit 28    PT Start Time 1200    PT Stop Time 1230    PT Time Calculation (min) 30 min    Equipment Utilized During Treatment Gait belt    Activity Tolerance Patient tolerated treatment well;Patient limited by fatigue    Behavior During Therapy WFL for tasks assessed/performed           Past Medical History:  Diagnosis Date  . Anxiety   . Arthritis   . ASCVD (arteriosclerotic cardiovascular disease)    03/2004: DES x2 to the RCA; IMI with stent occlusion in 02/2005- suboptimal Plavix compliance  . CAD (coronary artery disease)   . Carcinoma of prostate ([facility])    Clopidogrel held for biopsy  . Constipation due to pain medication   . Diabetes mellitus without complication ([facility])   . Diabetic neuropathy ([facility])   . Headache 2021  . Hyperlipidemia   . Hypertension   . Mild depression ([facility])   . Morbid obesity ([facility])   . Myocardial infarction ([facility])   . Shortness of breath dyspnea   . Sleep apnea    no longer uses cpap  . Stroke ([facility]) 2017  . Tobacco abuse    stopped smoking 06/28/09    Past Surgical History:  Procedure Laterality Date  . APPLICATION OF CRANIAL NAVIGATION N/A 07/04/2019   Procedure: APPLICATION OF CRANIAL NAVIGATION;  Surgeon: Ashok Pall, MD;  Location: [facility];  Service: Neurosurgery;  Laterality: N/A;  . BACK SURGERY    . CARDIAC CATHETERIZATION     X  1 stent before having CABG  . CORONARY ARTERY BYPASS GRAFT  06/28/2008   X4  . CRANIOTOMY Right 07/04/2019   Procedure: Right occipital craniotomy for tumor with brainlab;  Surgeon: Ashok Pall, MD;  Location: [facility];  Service: Neurosurgery;  Laterality: Right;  . ENDARTERECTOMY Right 07/03/2015   Procedure: RIGHT CAROTID ENDARTERECTOMY WITH BOVINE PERICARDIUM PATCH ANGIOPLASTY;  Surgeon: Serafina Mitchell, MD;  Location: [facility];  Service: Vascular;  Laterality: Right;  . LUMBAR LAMINECTOMY/DECOMPRESSION MICRODISCECTOMY N/A 09/06/2014   Procedure: LUMBER DECOMPRESSION L3-5;  Surgeon: Melina Schools, MD;  Location: [facility];  Service: Orthopedics;  Laterality: N/A;  . LUMBAR SPINE SURGERY  2002  . PROSTATE BIOPSY    . PROSTATECTOMY  02/2007  . VASECTOMY      There were no vitals filed for this visit.   Subjective Assessment - 01/23/20 1300    Subjective Wife reports that symptoms are stable.    Patient is accompained by: Family member    Pertinent History PMH: Rt occipital craniotomy 07/04/19 secondary to glioblastoma. PMH: Prostate CA, CVA 2017 (no residual deficits), CAD, DM, HTN, hx of 2 lumbar surgeries 2002, 2016 (laminectomies)    Limitations Standing;Walking    How long can you sit comfortably? no issues    How long can  you stand comfortably? 5 min    How long can you walk comfortably? 5 min    Patient Stated Goals walk better             Pt late to PT by 15' Pt reported walking to clinic makes him tired. He feels weak in his hips and back. Knee to chest: 20 x 5" holds R and L- passive bil progressed to AA and active Lower trunk rotations: 20x  Gait training: 1 x 220' with RW, cues to improve heel to toe to reduce foot slapping strike and stand up taller                        PT Short Term Goals - 01/09/20 1412      PT SHORT TERM GOAL #1   Title Pt will be able to perform 5x sit to stand under 30 seconds with bil UE to improve functional strength from  standard chair.    Baseline Pt unable to perform them (10/24/19); unable to without UE support (11/24/19); attempted with one UE but pt only able to complete 1 rep with multiple tries; pt was able to stand up using one UE on chair and second hand on walker (12/12/19); pt able to complete 3 reps with R UE on chair and L UE on walker but unable to complete 5 reps (12/19/19); bil UE use 42 seconds; bil UE use 45 seconds (01/09/20)    Time 4    Period Weeks    Status Revised    Target Date 01/16/20      PT SHORT TERM GOAL #2   Title Patient will be able to ambulate for 10 min without rest with use of his walker to improve walking endurnace    Baseline <5 min (10/24/19); 5-10 min (11/29/19);    Time 4    Period Weeks    Status Achieved    Target Date 11/21/19      PT SHORT TERM GOAL #3   Title Pt will demo 4 points improvement on Berg balance scale to improve overall static balance    Baseline 37/56 (10/24/19); 8/6 42/56; 01/09/20 46/56    Time 4    Period Weeks    Status Achieved    Target Date 11/21/19             PT Long Term Goals - 01/09/20 1431      PT LONG TERM GOAL #1   Title Pt will demo 47/56 or better on BBS to improve balance and reduce fall risk    Baseline 37/26 (10/24/19);11/24/19 42/56; 12/19/19 47/56    Time 8    Period Weeks    Status Achieved      PT LONG TERM GOAL #2   Title Patient will be able to perform sit to stand in under 25 seconds to improve overall strength    Baseline bil UE 42 seconds (12/19/19)    Time 8    Period Weeks    Status Revised      PT LONG TERM GOAL #3   Title Patient will be able to ambulate 78' with small based quad cane and SBA to improve short distance ambulation    Baseline quad cane 115' (12/19/19)    Time 8    Period Weeks    Status Revised      PT LONG TERM GOAL #4   Title Pt will demo improvement by 0.15 m/s with use of st. cane  to improve functional gait velocity    Baseline 0.14m/s with cane, 0.77m/s with rolling walker (eval)     Time 8    Status Revised                 Plan - 01/23/20 1301    Clinical Impression Statement Pt reported pain in L hip with knee to chest so we modified from patient doing exercise to PT performing passive motion. Towards the end, hip got looser and pt was able to perform it on his own with lesser pain but still painful. Pt reported short term benefits of stretching as he reorted hips and back felt looser. Pt was given HEP to work on it at home to improve flexibility in hips and lower back.    Personal Factors and Comorbidities Comorbidity 3+    Comorbidities craniotomy secondary to glioblastoma, hx of lumbar surgeries x 2, CABG x 4    Examination-Activity Limitations Squat;Stairs;Locomotion Level;Caring for Others;Carry    Examination-Participation Restrictions Cleaning;Community Activity;Driving;Laundry;Yard Work;Shop    Stability/Clinical Decision Making Unstable/Unpredictable    Rehab Potential Good    PT Frequency Other (comment)   2x week for 4 weeks and 1x/week for 4 more weeks for total of 8 weeks.   PT Duration 8 weeks    PT Treatment/Interventions ADLs/Self Care Home Management;Gait training;Stair training;Functional mobility training;Therapeutic activities;Therapeutic exercise;Balance training;Neuromuscular re-education;Vestibular;Visual/perceptual remediation/compensation;Manual techniques;Energy conservation;Joint Manipulations;Patient/family education;Passive range of motion    PT Next Visit Plan Focus on SLS, forward reaching, picking object from floor, turning 360 to improve BBS    PT Home Exercise Plan Access Code: [email or web address], Access Code: [email or web address]: [email or web address]: 10/05/2021Prepared by: Gwenyth Bouillon PatelExercisesHooklying Single Knee to Chest Stretch - 1 x daily - 7 x weekly - 10 reps - 10 holdSupine Lower Trunk Rotation - 1 x daily - 7 x weekly - 20 reps    Consulted and Agree with Plan of Care Patient;Family member/caregiver    Family Member  Consulted wife           Patient will benefit from skilled therapeutic intervention in order to improve the following deficits and impairments:  Abnormal gait, Decreased activity tolerance, Decreased balance, Decreased mobility, Decreased endurance, Difficulty walking, Dizziness, Impaired vision/preception  Visit Diagnosis: Other abnormalities of gait and mobility  Muscle weakness (generalized)  Unsteadiness on feet  Dizziness and giddiness  Glioblastoma multiforme (HCC)     Problem List Patient Active Problem List   Diagnosis Date Noted  . Glioblastoma multiforme ([facility]) 10/11/2019  . Dyslipidemia   . Seizure prophylaxis   . Chronic diastolic congestive heart failure ([place])   . Diabetic peripheral neuropathy ([facility])   . Palliative care by specialist   . Weakness 10/08/2019  . Vasogenic edema ([facility]) 10/08/2019  . Gross hematuria 10/08/2019  . DNR (do not resuscitate) discussion 07/13/2019  . S/P craniotomy 07/04/2019  . Glioblastoma with isocitrate dehydrogenase gene wildtype ([facility]) 07/04/2019  . Respiratory failure ([facility])   . Encephalopathy acute   . Brain mass 07/01/2019  . Fever 07/01/2019  . Atherosclerosis of native artery of both lower extremities with intermittent claudication ([facility]) 08/23/2018  . Coronary artery disease without angina pectoris 08/23/2018  . Dyspnea on exertion 08/23/2018  . Biochemically recurrent malignant neoplasm of prostate ([place]) 08/16/2018  . S/P carotid endarterectomy 08/20/2015  . Essential hypertension 08/20/2015  . Type 2 diabetes mellitus with circulatory disorder ([facility]) 08/20/2015  . HLD (hyperlipidemia) 08/20/2015  . Carotid artery stenosis, symptomatic 07/03/2015  . Carotid stenosis, right   . TIA (transient ischemic attack)  06/18/2015  . ASCVD (arteriosclerotic cardiovascular disease) 06/18/2015  . Diabetes mellitus without complication ([facility]) 45/06/8880  . Resistant hypertension 06/18/2015  . Hyperlipidemia 06/18/2015  . Spinal  stenosis of lumbar region with neurogenic claudication 09/06/2014  . Atherosclerosis of native artery of extremity with intermittent claudication ([facility]) 03/09/2013  . HYPERLIPIDEMIA 04/08/2009  . TOBACCO ABUSE 04/08/2009  . ATHEROSCLEROTIC CARDIOVASCULAR DISEASE 04/08/2009  . INTERMITTENT VERTIGO 04/08/2009  . ADENOCARCINOMA, PROSTATE 03/21/2009  . ABDOMINAL PAIN -GENERALIZED 03/21/2009  . History of coronary artery bypass graft 06/28/2008    Kerrie Pleasure, PT 01/23/2020, 1:04 PM  [facility] [address] [place], [place], [address] Phone: [phone]   Fax:  [phone]  Name: [name] MRN: [id number] Date of Birth: Jun 23, 1945

## 2020-01-23 NOTE — Telephone Encounter (Signed)
Patient's wife [name] (who is on patient's designated party release) called office stating she has noticed patient has "red blotches" on his R arm.  She states she noticed the prior to patient's last appointment but did not think anything of it. Patient's wife states blotches are now showing up on patient's left arm as well. Patient wants to know if this may be due to his low platelet levels or the temodar. [name] made aware. Per [name], the blotches are most likely bruising due to his low platelet levels. Patient instructed to monitor and will follow-up with patient at next scheduled visit on 02/05/20. Informed patient's wife of Dr. Renda Rolls response. Wife verbalized understanding and will relay information to patient. All questions were answered during phone call.

## 2020-01-26 ENCOUNTER — Encounter: Payer: Medicare Other | Admitting: Occupational Therapy

## 2020-01-26 ENCOUNTER — Ambulatory Visit: Payer: Medicare Other

## 2020-01-30 ENCOUNTER — Other Ambulatory Visit: Payer: Self-pay

## 2020-01-30 ENCOUNTER — Ambulatory Visit: Payer: Medicare Other

## 2020-01-30 ENCOUNTER — Ambulatory Visit: Payer: Medicare Other | Admitting: Occupational Therapy

## 2020-01-30 ENCOUNTER — Encounter: Payer: Self-pay | Admitting: Occupational Therapy

## 2020-01-30 VITALS — BP 122/80 | HR 95

## 2020-01-30 DIAGNOSIS — R42 Dizziness and giddiness: Secondary | ICD-10-CM | POA: Diagnosis not present

## 2020-01-30 DIAGNOSIS — R2689 Other abnormalities of gait and mobility: Secondary | ICD-10-CM

## 2020-01-30 DIAGNOSIS — R2681 Unsteadiness on feet: Secondary | ICD-10-CM | POA: Diagnosis not present

## 2020-01-30 DIAGNOSIS — G9340 Encephalopathy, unspecified: Secondary | ICD-10-CM | POA: Diagnosis not present

## 2020-01-30 DIAGNOSIS — R41842 Visuospatial deficit: Secondary | ICD-10-CM

## 2020-01-30 DIAGNOSIS — M6281 Muscle weakness (generalized): Secondary | ICD-10-CM

## 2020-01-30 DIAGNOSIS — H53462 Homonymous bilateral field defects, left side: Secondary | ICD-10-CM | POA: Diagnosis not present

## 2020-01-30 DIAGNOSIS — R278 Other lack of coordination: Secondary | ICD-10-CM

## 2020-01-30 NOTE — Therapy (Signed)
[facility] [address] [place], [place], [address] Phone: [phone]   Fax:  [phone]  Occupational Therapy Treatment  Patient Details  Name: [name] MRN: [id number] Date of Birth: 11-04-1945 Referring Provider (OT): Dr. Bertis Ruddy   Encounter Date: 01/30/2020   OT End of Session - 01/30/20 1114    Visit Number 23    Number of Visits 25    Date for OT Re-Evaluation 02/23/20    Authorization Type MCR primary, AARP secondary    Authorization Time Period 90 days, may d/c after 8 weeks dep on progress.    Authorization - Visit Number 23    Progress Note Due on Visit 4    OT Start Time 1111    OT Stop Time 1145    OT Time Calculation (min) 34 min    Activity Tolerance Patient tolerated treatment well    Behavior During Therapy WFL for tasks assessed/performed           Past Medical History:  Diagnosis Date  . Anxiety   . Arthritis   . ASCVD (arteriosclerotic cardiovascular disease)    03/2004: DES x2 to the RCA; IMI with stent occlusion in 02/2005- suboptimal Plavix compliance  . CAD (coronary artery disease)   . Carcinoma of prostate ([place])    Clopidogrel held for biopsy  . Constipation due to pain medication   . Diabetes mellitus without complication ([place])   . Diabetic neuropathy ([place])   . Headache 2021  . Hyperlipidemia   . Hypertension   . Mild depression ([place])   . Morbid obesity ([place])   . Myocardial infarction ([place])   . Shortness of breath dyspnea   . Sleep apnea    no longer uses cpap  . Stroke ([place]) 2017  . Tobacco abuse    stopped smoking 06/28/09    Past Surgical History:  Procedure Laterality Date  . APPLICATION OF CRANIAL NAVIGATION N/A 07/04/2019   Procedure: APPLICATION OF CRANIAL NAVIGATION;  Surgeon: Ashok Pall, MD;  Location: [facility];  Service: Neurosurgery;  Laterality: N/A;  . BACK SURGERY    . CARDIAC CATHETERIZATION     X 1 stent before having CABG  .  CORONARY ARTERY BYPASS GRAFT  06/28/2008   X4  . CRANIOTOMY Right 07/04/2019   Procedure: Right occipital craniotomy for tumor with brainlab;  Surgeon: Ashok Pall, MD;  Location: [facility];  Service: Neurosurgery;  Laterality: Right;  . ENDARTERECTOMY Right 07/03/2015   Procedure: RIGHT CAROTID ENDARTERECTOMY WITH BOVINE PERICARDIUM PATCH ANGIOPLASTY;  Surgeon: Serafina Mitchell, MD;  Location: [facility];  Service: Vascular;  Laterality: Right;  . LUMBAR LAMINECTOMY/DECOMPRESSION MICRODISCECTOMY N/A 09/06/2014   Procedure: LUMBER DECOMPRESSION L3-5;  Surgeon: Melina Schools, MD;  Location: [facility];  Service: Orthopedics;  Laterality: N/A;  . LUMBAR SPINE SURGERY  2002  . PROSTATE BIOPSY    . PROSTATECTOMY  02/2007  . VASECTOMY      There were no vitals filed for this visit.   Subjective Assessment - 01/30/20 1113    Subjective  "this walk has never felt this long" Pt denies pain but very fatigued after walk into the clinic.    Patient is accompanied by: Family member   wife   Pertinent History [name] is a 74 y.o. 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, prostate cancer with radiation therapy prostatectomy, CAD  with CABG maintained on Xarelto, chronic diastolic congestive heart failure.  Lives with spouse two-level home he had been receiving outpatient therapies.  Presented 10/08/2019 with increasing left-sided weakness headache nausea vomiting as well as bouts of hematuria.   CT of the head showed new severe vasogenic edema throughout the posterior right cerebral hemisphere with 8 mm leftNo evidence of acute intracranial abnormality.  Follow-up medical oncology maintained on Decadron therapy.    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 No/denies                 Treatment:  Connect Four chips and placing chips with LUE in row. Mod-max verbal and visual cues for following one color pattern and pt with decreased strength in LUE. Patient would use RUE for support for LUE for strength and presented with decreased coordination.   Sorting Blink cards by color with max cues for scanning to the left.   Placing Large pegs into board with LUE - pt demonstrated decreased strength and coordination this day.               OT Short Term Goals - 01/12/20 1236      OT SHORT TERM GOAL #1   Title Pt to verbalize understanding of visual compensatory strategies to improve daily function including reading and environmental scanning    Time 4    Period Weeks    Status Achieved      OT SHORT TERM GOAL #2   Title Pt to perform tabletop scanning with 90% or greater accuracy using strategies    Baseline 85% for 1.5 M number cancellation    Time 4    Period Weeks    Status On-going   53% accuracy 01/12/20     OT SHORT TERM GOAL #3   Title Pt will read 3/4 page of text with accuracy, visual aids/AE and extra time PRN    Time 4    Period Weeks    Status Achieved   met with larger print double spaced and line guide     OT SHORT TERM GOAL #4   Title Pt will perform dressing with supervision/ set up    Baseline min A for shoes/ socks    Time 4    Period Weeks    Status Achieved   pt supervision with increased time with socks.  supervision with shoes (needs help with orientation). 01/12/20     OT SHORT TERM GOAL #5   Title Pt will demonstrate improved LUE fine motor coordination for ADLs as evidenced by decreasing 9 hole peg test score to 52 secs or less.    Baseline RUE 33 secs, LUE 58.25    Time 4    Period Weeks    Status Achieved   01/12/20 - 49.31 seconds     OT SHORT TERM GOAL #6   Title Pt will demonstrate ability to retrieve a llightweight object at 130 shoulder flexion with LUE  demonstrating good control.    Baseline shoulder flexion RUE 140, LUE 120    Time 4    Period Weeks    Status Achieved   11/21/19:  130*            OT Long Term Goals - 01/12/20 1246      OT LONG TERM GOAL #1   Title Pt will perform environmental scanning at  90% accuracy in mod distracting environment    Time 12    Period Weeks    Status New      OT LONG TERM GOAL #2   Title Pt to read full page of text or greater with visual aids/AE prn    Time 12    Period Weeks    Status New      OT LONG TERM GOAL #3   Title Pt will demonstrate improved fine motor coordination in LUE for ADLS as evidenced by decreasing 9 hole peg test score to 48 secs or less.    Baseline Pt will perform all basic ADLS with distant supervision.    Time 12    Period Weeks    Status On-going   LUE 49.31 seconds - 01/12/20     OT LONG TERM GOAL #4   Title Pt will perform basic home management tasks with supervision demonstrating good safety awareness.    Time 12    Period Weeks    Status Deferred   pt is not performing any home management and not appropriate at this time. 01/12/20     OT LONG TERM GOAL #5   Title Pt will negotiate in kitchen with good safety precautions and utilize visual strategies to locate preferred item with supervision. (new goal at renewal)    Time 6    Period Weeks    Status New    Target Date 02/23/20                 Plan - 01/30/20 1114    Clinical Impression Statement Pt impacted with fatigue this day and poor strength and endurance.    OT Occupational Profile and History Detailed Assessment- Review of Records and additional review of physical, cognitive, psychosocial history related to current functional performance    Occupational performance deficits (Please refer to evaluation for details): IADL's;Work;Leisure;Social Participation;ADL's    Body Structure / Function / Physical Skills IADL;Endurance;Balance;Vision;ADL;Mobility;Strength;UE functional  use;FMC;Coordination;Gait;GMC;ROM;Decreased knowledge of use of DME;Dexterity    Cognitive Skills Memory;Problem Solve;Perception;Thought;Safety Awareness    Rehab Potential Good    Comorbidities impacting occupational performance description: glioblastoma - pt has begun radiation    OT Frequency 1x / week   2x/week for 2 weeks and 1x/week for 4 weeks   OT Duration 6 weeks    OT Treatment/Interventions Self-care/ADL training;Functional Mobility Training;Therapeutic activities;Coping strategies training;Visual/perceptual remediation/compensation;Patient/family education;Cognitive remediation/compensation;Therapeutic exercise;Balance training;Manual Therapy;Neuromuscular education;Aquatic Therapy;Energy conservation;DME and/or AE instruction;Paraffin;Fluidtherapy;Gait Training;Passive range of motion;Moist Heat    Plan safety awareness, kitchen mobility and environmental scanning    Consulted and Agree with Plan of Care Patient;Family member/caregiver    Family Member Consulted wife           Patient will benefit from skilled therapeutic intervention in order to improve the following deficits and impairments:   Body Structure / Function / Physical Skills: IADL, Endurance, Balance, Vision, ADL, Mobility, Strength, UE functional use, FMC, Coordination, Gait, GMC, ROM, Decreased knowledge of use of DME, Dexterity Cognitive Skills: Memory, Problem Solve, Perception, Thought, Safety Awareness     Visit Diagnosis: Other abnormalities of gait and mobility  Muscle weakness (generalized)  Unsteadiness on feet  Visuospatial deficit  Other lack of coordination    Problem List Patient Active Problem List   Diagnosis Date Noted  . Glioblastoma multiforme ([facility]) 10/11/2019  . Dyslipidemia   . Seizure prophylaxis   . Chronic diastolic congestive heart failure ([place])   . Diabetic peripheral neuropathy ([place])   . Palliative care by specialist   .  Weakness 10/08/2019  . Vasogenic edema ([facility])  10/08/2019  . Gross hematuria 10/08/2019  . DNR (do not resuscitate) discussion 07/13/2019  . S/P craniotomy 07/04/2019  . Glioblastoma with isocitrate dehydrogenase gene wildtype ([facility]) 07/04/2019  . Respiratory failure ([place])   . Encephalopathy acute   . Brain mass 07/01/2019  . Fever 07/01/2019  . Atherosclerosis of native artery of both lower extremities with intermittent claudication ([facility]) 08/23/2018  . Coronary artery disease without angina pectoris 08/23/2018  . Dyspnea on exertion 08/23/2018  . Biochemically recurrent malignant neoplasm of prostate ([facility]) 08/16/2018  . S/P carotid endarterectomy 08/20/2015  . Essential hypertension 08/20/2015  . Type 2 diabetes mellitus with circulatory disorder ([facility]) 08/20/2015  . HLD (hyperlipidemia) 08/20/2015  . Carotid artery stenosis, symptomatic 07/03/2015  . Carotid stenosis, right   . TIA (transient ischemic attack) 06/18/2015  . ASCVD (arteriosclerotic cardiovascular disease) 06/18/2015  . Diabetes mellitus without complication ([facility]) 93/81/8299  . Resistant hypertension 06/18/2015  . Hyperlipidemia 06/18/2015  . Spinal stenosis of lumbar region with neurogenic claudication 09/06/2014  . Atherosclerosis of native artery of extremity with intermittent claudication ([facility]) 03/09/2013  . HYPERLIPIDEMIA 04/08/2009  . TOBACCO ABUSE 04/08/2009  . ATHEROSCLEROTIC CARDIOVASCULAR DISEASE 04/08/2009  . INTERMITTENT VERTIGO 04/08/2009  . ADENOCARCINOMA, PROSTATE 03/21/2009  . ABDOMINAL PAIN -GENERALIZED 03/21/2009  . History of coronary artery bypass graft 06/28/2008    Zachery Conch MOT, OTR/L  01/30/2020, 11:30 AM  [facility] [address] [place], [place], [address] Phone: [phone]   Fax:  [phone]  Name: ROMAIN ERION MRN: [id number] Date of Birth: 1946-01-09

## 2020-01-30 NOTE — Therapy (Signed)
[facility] [address] [place], [place], [address] Phone: [phone]   Fax:  [phone]  Physical Therapy Treatment  Patient Details  Name: [name] MRN: [id number] Date of Birth: 1945-12-26 Referring Provider (PT): [name]   Encounter Date: 01/30/2020   PT End of Session - 01/30/20 1150    Visit Number 22    Number of Visits 26    Date for PT Re-Evaluation 02/13/20    Authorization Type 10 V PN on 10/12/61; recert 8/93/73 (2x/wek for 4 weeks, 1x/week for 4 more weeks - total 8 weeks), 20V PN on 01/19/20    Progress Note Due on Visit 28    PT Start Time 1146    PT Stop Time 1228    PT Time Calculation (min) 42 min    Equipment Utilized During Treatment Gait belt    Activity Tolerance Patient tolerated treatment well;Patient limited by fatigue    Behavior During Therapy WFL for tasks assessed/performed           Past Medical History:  Diagnosis Date  . Anxiety   . Arthritis   . ASCVD (arteriosclerotic cardiovascular disease)    03/2004: DES x2 to the RCA; IMI with stent occlusion in 02/2005- suboptimal Plavix compliance  . CAD (coronary artery disease)   . Carcinoma of prostate ([place])    Clopidogrel held for biopsy  . Constipation due to pain medication   . Diabetes mellitus without complication ([place])   . Diabetic neuropathy ([place])   . Headache 2021  . Hyperlipidemia   . Hypertension   . Mild depression ([place])   . Morbid obesity ([place])   . Myocardial infarction ([place])   . Shortness of breath dyspnea   . Sleep apnea    no longer uses cpap  . Stroke ([place]) 2017  . Tobacco abuse    stopped smoking 06/28/09    Past Surgical History:  Procedure Laterality Date  . APPLICATION OF CRANIAL NAVIGATION N/A 07/04/2019   Procedure: APPLICATION OF CRANIAL NAVIGATION;  Surgeon: Ashok Pall, MD;  Location: [facility];  Service: Neurosurgery;  Laterality: N/A;  . BACK SURGERY    . CARDIAC CATHETERIZATION      X 1 stent before having CABG  . CORONARY ARTERY BYPASS GRAFT  06/28/2008   X4  . CRANIOTOMY Right 07/04/2019   Procedure: Right occipital craniotomy for tumor with brainlab;  Surgeon: Ashok Pall, MD;  Location: [facility];  Service: Neurosurgery;  Laterality: Right;  . ENDARTERECTOMY Right 07/03/2015   Procedure: RIGHT CAROTID ENDARTERECTOMY WITH BOVINE PERICARDIUM PATCH ANGIOPLASTY;  Surgeon: Serafina Mitchell, MD;  Location: [facility];  Service: Vascular;  Laterality: Right;  . LUMBAR LAMINECTOMY/DECOMPRESSION MICRODISCECTOMY N/A 09/06/2014   Procedure: LUMBER DECOMPRESSION L3-5;  Surgeon: Melina Schools, MD;  Location: [facility];  Service: Orthopedics;  Laterality: N/A;  . LUMBAR SPINE SURGERY  2002  . PROSTATE BIOPSY    . PROSTATECTOMY  02/2007  . VASECTOMY      Vitals:   01/30/20 1155  BP: 122/80  Pulse: 95     Subjective Assessment - 01/30/20 1151    Subjective Wife reports that that recall/memory has been worse lately. patient reports moving slow this morning. No falls to report. wife reports near fall in bathroom, where patient began to lean to R and had difficulty moving LLE.    Patient is accompained by: Family member    Pertinent History PMH: Rt occipital craniotomy 07/04/19 secondary to glioblastoma. PMH: Prostate CA, CVA 2017 (  no residual deficits), CAD, DM, HTN, hx of 2 lumbar surgeries 2002, 2016 (laminectomies)    Limitations Standing;Walking    How long can you sit comfortably? no issues    How long can you stand comfortably? 5 min    How long can you walk comfortably? 5 min    Patient Stated Goals walk better    Currently in Pain? No/denies                             [facility] Adult PT Treatment/Exercise - 01/30/20 0001      Bed Mobility   Bed Mobility Rolling Right;Rolling Left;Supine to Sit;Sit to Supine    Rolling Right Supervision/verbal cueing    Rolling Left Supervision/Verbal cueing    Supine to Sit Minimal Assistance - Patient > 75%    Sit to Supine  Minimal Assistance - Patient > 75%      Transfers   Transfers Sit to Stand;Stand to Sit    Sit to Stand 4: Min guard;With upper extremity assist;From bed;From chair/3-in-1;4: Min assist    Sit to Stand Details Verbal cues for technique;Verbal cues for sequencing;Verbal cues for safe use of DME/AE    Stand to Sit 4: Min assist;With upper extremity assist;To bed;To chair/3-in-1;Uncontrolled descent    Stand to Sit Details (indicate cue type and reason) Verbal cues for sequencing;Verbal cues for precautions/safety;Verbal cues for safe use of DME/AE    Comments patient require multiple attempts and increased verbal cueing for completion of sit <> stand from mat      Ambulation/Gait   Ambulation/Gait Yes    Ambulation/Gait Assistance 5: Supervision;4: Min guard    Ambulation/Gait Assistance Details x 75 ft throughout session. patient demo decreased awareness on L side.     Ambulation Distance (Feet) 75 Feet    Assistive device Rolling walker    Gait Pattern Decreased stance time - left;Decreased stride length;Decreased hip/knee flexion - left;Decreased dorsiflexion - left;Antalgic;Decreased trunk rotation;Wide base of support;Poor foot clearance - left    Ambulation Surface Level;Indoor      Therapeutic Activites    Therapeutic Activities Other Therapeutic Activities    Other Therapeutic Activities Due to patient difficulty with getting from sit <> supine to perform BLE strengthening exercises. Completed bed mobility training including rolling to R/L, as well as moving from sit <> supine and supine <> sit. Pt demo improper form and difficulty due to weakness. PT educating on completion of log roll to R side, and ability to push up to through R elbow. Completed x 2 reps.       Exercises   Exercises Knee/Hip      Knee/Hip Exercises: Supine   Bridges Strengthening;Both;2 sets;10 reps;Limitations    Bridges Limitations verbal cues for form, slowed paced to promote improved strengthening and  technique    Other Supine Knee/Hip Exercises completed alternating bent knee fall out with red theraband x 10 reps bilaterally, verbal cues for technique/form. In hooklying: completed alternating marching with red theraband x 10 reps bilaterally, progressed with next set of 10 reps to include 2-3 seocnd hold in hip flexion to further promote strength. Completed trunk rotations bilaterally x 10 reps each direction, patient increased difficulty with trunk rotation to R side as often leaves LLE behind, increased verbal/tactile cues required                    PT Short Term Goals - 01/09/20 1412  PT SHORT TERM GOAL #1   Title Pt will be able to perform 5x sit to stand under 30 seconds with bil UE to improve functional strength from standard chair.    Baseline Pt unable to perform them (10/24/19); unable to without UE support (11/24/19); attempted with one UE but pt only able to complete 1 rep with multiple tries; pt was able to stand up using one UE on chair and second hand on walker (12/12/19); pt able to complete 3 reps with R UE on chair and L UE on walker but unable to complete 5 reps (12/19/19); bil UE use 42 seconds; bil UE use 45 seconds (01/09/20)    Time 4    Period Weeks    Status Revised    Target Date 01/16/20      PT SHORT TERM GOAL #2   Title Patient will be able to ambulate for 10 min without rest with use of his walker to improve walking endurnace    Baseline <5 min (10/24/19); 5-10 min (11/29/19);    Time 4    Period Weeks    Status Achieved    Target Date 11/21/19      PT SHORT TERM GOAL #3   Title Pt will demo 4 points improvement on Berg balance scale to improve overall static balance    Baseline 37/56 (10/24/19); 8/6 42/56; 01/09/20 46/56    Time 4    Period Weeks    Status Achieved    Target Date 11/21/19             PT Long Term Goals - 01/09/20 1431      PT LONG TERM GOAL #1   Title Pt will demo 47/56 or better on BBS to improve balance and reduce fall risk     Baseline 37/26 (10/24/19);11/24/19 42/56; 12/19/19 47/56    Time 8    Period Weeks    Status Achieved      PT LONG TERM GOAL #2   Title Patient will be able to perform sit to stand in under 25 seconds to improve overall strength    Baseline bil UE 42 seconds (12/19/19)    Time 8    Period Weeks    Status Revised      PT LONG TERM GOAL #3   Title Patient will be able to ambulate 22' with small based quad cane and SBA to improve short distance ambulation    Baseline quad cane 115' (12/19/19)    Time 8    Period Weeks    Status Revised      PT LONG TERM GOAL #4   Title Pt will demo improvement by 0.15 m/s with use of st. cane to improve functional gait velocity    Baseline 0.26m/s with cane, 0.25m/s with rolling walker (eval)    Time 8    Status Revised                 Plan - 01/30/20 1337    Clinical Impression Statement Today's skilled PT session included continued strengthening exercises for BLE. Increased difficulty noted with bed mobility trying to move into supine position. PT completing bed mobility training with patient, and educating patient/wife on use of log roll for improved. Will continue to progress.    Personal Factors and Comorbidities Comorbidity 3+    Comorbidities craniotomy secondary to glioblastoma, hx of lumbar surgeries x 2, CABG x 4    Examination-Activity Limitations Squat;Stairs;Patent attorney for Others;Carry    Examination-Participation Restrictions Cleaning;Community  Activity;Driving;Laundry;Yard Work;Shop    Stability/Clinical Decision Making Unstable/Unpredictable    Rehab Potential Good    PT Frequency Other (comment)   2x week for 4 weeks and 1x/week for 4 more weeks for total of 8 weeks.   PT Duration 8 weeks    PT Treatment/Interventions ADLs/Self Care Home Management;Gait training;Stair training;Functional mobility training;Therapeutic activities;Therapeutic exercise;Balance training;Neuromuscular  re-education;Vestibular;Visual/perceptual remediation/compensation;Manual techniques;Energy conservation;Joint Manipulations;Patient/family education;Passive range of motion    PT Next Visit Plan continue seated/supine strengthening exercises for BLE, bed mobility,  forward reaching, picking object from floor, turning 360 to improve BBS    PT Home Exercise Plan Access Code: [email or web address], Access Code: [email or web address]: [email or web address]: 10/05/2021Prepared by: Gwenyth Bouillon PatelExercisesHooklying Single Knee to Chest Stretch - 1 x daily - 7 x weekly - 10 reps - 10 holdSupine Lower Trunk Rotation - 1 x daily - 7 x weekly - 20 reps    Consulted and Agree with Plan of Care Patient;Family member/caregiver    Family Member Consulted wife           Patient will benefit from skilled therapeutic intervention in order to improve the following deficits and impairments:  Abnormal gait, Decreased activity tolerance, Decreased balance, Decreased mobility, Decreased endurance, Difficulty walking, Dizziness, Impaired vision/preception  Visit Diagnosis: Other abnormalities of gait and mobility  Muscle weakness (generalized)  Unsteadiness on feet     Problem List Patient Active Problem List   Diagnosis Date Noted  . 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 Dyslipidemia   . Seizure prophylaxis   . Chronic diastolic congestive heart failure ([place])   . Diabetic peripheral neuropathy ([place])   . Palliative care by specialist   . Weakness 10/08/2019  . Vasogenic edema ([place]) 10/08/2019  . Gross hematuria 10/08/2019  . DNR (do not resuscitate) discussion 07/13/2019  . S/P craniotomy 07/04/2019  . 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 Respiratory failure ([place])   . Encephalopathy acute   . Brain mass 07/01/2019  . Fever 07/01/2019  . Atherosclerosis of native artery of both lower extremities with intermittent claudication ([place]) 08/23/2018  . Coronary  artery disease without angina pectoris 08/23/2018  . Dyspnea on exertion 08/23/2018  . Biochemically recurrent malignant neoplasm of prostate ([place]) 08/16/2018  . S/P carotid endarterectomy 08/20/2015  . Essential hypertension 08/20/2015  . 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 HLD (hyperlipidemia) 08/20/2015  . Carotid artery stenosis, symptomatic 07/03/2015  . Carotid stenosis, right   . TIA (transient ischemic attack) 06/18/2015  . ASCVD (arteriosclerotic cardiovascular disease) 06/18/2015  . Diabetes mellitus without complication ([place]) 16/01/9603  . Resistant hypertension 06/18/2015  . Hyperlipidemia 06/18/2015  . Spinal stenosis of lumbar region with neurogenic claudication 09/06/2014  . Atherosclerosis of native artery of extremity with intermittent claudication ([place]) 03/09/2013  . HYPERLIPIDEMIA 04/08/2009  . TOBACCO ABUSE 04/08/2009  . ATHEROSCLEROTIC CARDIOVASCULAR DISEASE 04/08/2009  . INTERMITTENT VERTIGO 04/08/2009  . ADENOCARCINOMA, PROSTATE 03/21/2009  . ABDOMINAL PAIN -GENERALIZED 03/21/2009  . History of coronary artery bypass graft 06/28/2008    Jones Bales, PT, DPT 01/30/2020, 1:45 PM  [facility] [address] [place], [place], [address] Phone: [phone]   Fax:  [phone]  Name: RODRIGO MCGRANAHAN MRN: [id number] Date of Birth: 05-04-1945

## 2020-02-05 ENCOUNTER — Other Ambulatory Visit: Payer: Self-pay | Admitting: Internal Medicine

## 2020-02-05 ENCOUNTER — Inpatient Hospital Stay: Payer: Medicare Other

## 2020-02-05 ENCOUNTER — Other Ambulatory Visit: Payer: Self-pay

## 2020-02-05 ENCOUNTER — Inpatient Hospital Stay: Payer: Medicare Other | Attending: Internal Medicine | Admitting: Internal Medicine

## 2020-02-05 VITALS — BP 146/76 | HR 86 | Temp 96.4°F | Resp 18 | Ht 74.0 in | Wt 196.9 lb

## 2020-02-05 DIAGNOSIS — E785 Hyperlipidemia, unspecified: Secondary | ICD-10-CM | POA: Diagnosis not present

## 2020-02-05 DIAGNOSIS — I10 Essential (primary) hypertension: Secondary | ICD-10-CM | POA: Insufficient documentation

## 2020-02-05 DIAGNOSIS — Z7901 Long term (current) use of anticoagulants: Secondary | ICD-10-CM | POA: Diagnosis not present

## 2020-02-05 DIAGNOSIS — I252 Old myocardial infarction: Secondary | ICD-10-CM | POA: Diagnosis not present

## 2020-02-05 DIAGNOSIS — Z87891 Personal history of nicotine dependence: Secondary | ICD-10-CM | POA: Insufficient documentation

## 2020-02-05 DIAGNOSIS — Z8673 Personal history of transient ischemic attack (TIA), and cerebral infarction without residual deficits: Secondary | ICD-10-CM | POA: Insufficient documentation

## 2020-02-05 DIAGNOSIS — Z7952 Long term (current) use of systemic steroids: Secondary | ICD-10-CM | POA: Insufficient documentation

## 2020-02-05 DIAGNOSIS — Z803 Family history of malignant neoplasm of breast: Secondary | ICD-10-CM | POA: Diagnosis not present

## 2020-02-05 DIAGNOSIS — Z9079 Acquired absence of other genital organ(s): Secondary | ICD-10-CM | POA: Diagnosis not present

## 2020-02-05 DIAGNOSIS — I2510 Atherosclerotic heart disease of native coronary artery without angina pectoris: Secondary | ICD-10-CM | POA: Insufficient documentation

## 2020-02-05 DIAGNOSIS — Z23 Encounter for immunization: Secondary | ICD-10-CM

## 2020-02-05 DIAGNOSIS — Z923 Personal history of irradiation: Secondary | ICD-10-CM | POA: Diagnosis not present

## 2020-02-05 DIAGNOSIS — Z8546 Personal history of malignant neoplasm of prostate: Secondary | ICD-10-CM | POA: Insufficient documentation

## 2020-02-05 DIAGNOSIS — Z79899 Other long term (current) drug therapy: Secondary | ICD-10-CM | POA: Insufficient documentation

## 2020-02-05 DIAGNOSIS — C719 Malignant neoplasm of brain, unspecified: Secondary | ICD-10-CM

## 2020-02-05 DIAGNOSIS — Z9221 Personal history of antineoplastic chemotherapy: Secondary | ICD-10-CM | POA: Diagnosis not present

## 2020-02-05 DIAGNOSIS — Z7902 Long term (current) use of antithrombotics/antiplatelets: Secondary | ICD-10-CM | POA: Diagnosis not present

## 2020-02-05 DIAGNOSIS — G4730 Sleep apnea, unspecified: Secondary | ICD-10-CM | POA: Insufficient documentation

## 2020-02-05 DIAGNOSIS — C714 Malignant neoplasm of occipital lobe: Secondary | ICD-10-CM | POA: Diagnosis not present

## 2020-02-05 LAB — CBC WITH DIFFERENTIAL (CANCER CENTER ONLY)
Abs Immature Granulocytes: 0.03 10*3/uL (ref 0.00–0.07)
Basophils Absolute: 0 10*3/uL (ref 0.0–0.1)
Basophils Relative: 1 %
Eosinophils Absolute: 0 10*3/uL (ref 0.0–0.5)
Eosinophils Relative: 0 %
HCT: 35.4 % — ABNORMAL LOW (ref 39.0–52.0)
Hemoglobin: 11.9 g/dL — ABNORMAL LOW (ref 13.0–17.0)
Immature Granulocytes: 1 %
Lymphocytes Relative: 12 %
Lymphs Abs: 0.4 10*3/uL — ABNORMAL LOW (ref 0.7–4.0)
MCH: 30.5 pg (ref 26.0–34.0)
MCHC: 33.6 g/dL (ref 30.0–36.0)
MCV: 90.8 fL (ref 80.0–100.0)
Monocytes Absolute: 0.4 10*3/uL (ref 0.1–1.0)
Monocytes Relative: 11 %
Neutro Abs: 2.8 10*3/uL (ref 1.7–7.7)
Neutrophils Relative %: 75 %
Platelet Count: 91 10*3/uL — ABNORMAL LOW (ref 150–400)
RBC: 3.9 MIL/uL — ABNORMAL LOW (ref 4.22–5.81)
RDW: 14.9 % (ref 11.5–15.5)
WBC Count: 3.7 10*3/uL — ABNORMAL LOW (ref 4.0–10.5)
nRBC: 0 % (ref 0.0–0.2)

## 2020-02-05 LAB — CMP (CANCER CENTER ONLY)
ALT: 21 U/L (ref 0–44)
AST: 15 U/L (ref 15–41)
Albumin: 3.5 g/dL (ref 3.5–5.0)
Alkaline Phosphatase: 63 U/L (ref 38–126)
Anion gap: 7 (ref 5–15)
BUN: 20 mg/dL (ref 8–23)
CO2: 31 mmol/L (ref 22–32)
Calcium: 9.4 mg/dL (ref 8.9–10.3)
Chloride: 100 mmol/L (ref 98–111)
Creatinine: 0.78 mg/dL (ref 0.61–1.24)
GFR, Estimated: >60 mL/min (ref >60)
Glucose, Bld: 168 mg/dL — ABNORMAL HIGH (ref 70–99)
Potassium: 3.8 mmol/L (ref 3.5–5.1)
Sodium: 138 mmol/L (ref 135–145)
Total Bilirubin: 1.4 mg/dL — ABNORMAL HIGH (ref 0.3–1.2)
Total Protein: 6.3 g/dL — ABNORMAL LOW (ref 6.5–8.1)

## 2020-02-05 MED ORDER — TEMOZOLOMIDE 180 MG PO CAPS: 180.0000 mg | ORAL_CAPSULE | Freq: Every day | ORAL | 0 refills | Status: DC

## 2020-02-05 MED ORDER — INFLUENZA VAC A&B SA ADJ QUAD 0.5 ML IM PRSY
0.5000 mL | PREFILLED_SYRINGE | Freq: Once | INTRAMUSCULAR | Status: AC
  Administered 2020-02-05: 0.5 mL via INTRAMUSCULAR

## 2020-02-05 MED ORDER — DEXAMETHASONE 2 MG PO TABS
4.0000 mg | ORAL_TABLET | Freq: Two times a day (BID) | ORAL | 3 refills | Status: DC
Start: 2020-02-05 — End: 2020-03-21

## 2020-02-05 MED ORDER — INFLUENZA VAC A&B SA ADJ QUAD 0.5 ML IM PRSY
PREFILLED_SYRINGE | INTRAMUSCULAR | Status: AC
  Filled 2020-02-05: qty 0.5

## 2020-02-05 MED ORDER — TEMOZOLOMIDE 140 MG PO CAPS: 140.0000 mg | ORAL_CAPSULE | Freq: Every day | ORAL | 0 refills | Status: DC

## 2020-02-05 MED ORDER — LEVETIRACETAM 1000 MG PO TABS
1000.0000 mg | ORAL_TABLET | Freq: Two times a day (BID) | ORAL | 3 refills | Status: DC
Start: 2020-02-05 — End: 2020-03-21

## 2020-02-05 NOTE — Progress Notes (Signed)
   Covid-19 Vaccination [facility]  Name:  [name]    MRN: [id number] DOB: 05/30/1945  02/05/2020  [name] was observed post Covid-19 immunization for 15 minutes without incident. He was provided with Vaccine Information Sheet and instruction to access the V-Safe system.   [name] was instructed to call 911 with any severe reactions post vaccine: Marland Kitchen Difficulty breathing  . Swelling of face and throat  . A fast heartbeat  . A bad rash all over body  . Dizziness and weakness

## 2020-02-05 NOTE — Progress Notes (Signed)
[facility], [place] [address] [phone]   Interval Evaluation  Date of Service: 02/05/20 Patient Name: [name] Patient MRN: [id number] Patient DOB: [birth year] Provider: Ventura Sellers, MD  Identifying Statement:  [name] is a 74 y.o. male with right occipital glioblastoma    Referring Provider: Maurice Small, MD [address] [place],  [place] [address]  Oncologic History: Oncology History  Glioblastoma with isocitrate dehydrogenase gene wildtype ([facility])  07/04/2019 Surgery   Debulking resection by Dr. Christella Noa; path demonstrates Glioblastoma   08/17/2019 - 09/28/2019 Radiation Therapy   IMRT with concurrent daily Temozolomide 75mg /m2   10/30/2019 -  Chemotherapy   The patient had temozolomide (TEMODAR) 180 MG capsule, 180 mg (100 % of original dose 180 mg), Oral, Daily, 1 of 1 cycle, Start date: 10/30/2019, End date: -- Dose modification: 180 mg (original dose 180 mg, Cycle 1) temozolomide (TEMODAR) 140 MG capsule, 140 mg, Oral, Daily, 1 of 1 cycle, Start date: 01/02/2020, End date: -- Dose modification: 140 mg (original dose 140 mg, Cycle 1), 200 mg/m2/day (original dose 140 mg, Cycle 1) temozolomide (TEMODAR) 180 MG capsule, 180 mg, Oral, Daily, 1 of 1 cycle, Start date: 01/02/2020, End date: --  for chemotherapy treatment.      Biomarkers:  MGMT Unmethylated.  IDH 1/2 Wild type.  EGFR Not expressed  TERT Unknown   Interval History:  [name] presents to clinic today following recent MRI brain.  He has completed cycle #3 no issues this month with chemo. He feels left sided weakness has progressed this month, in particular with dragging the left leg.  He is mainly using a walker for ambulation.  Wife thinks his memory and cognition are "slower" than last month as well.  Overall his left sided vision is stable from prior.  Decadron has decreased down to 2mg  daily, from  6mg  prior.  H+P (07/13/19) Patient presented in early March 2021 with several days of progressive left sided visual impairment.  He noticed "missing things" in the left side of his field in his work and while driving.  He then developed headaches which were new for him.  Never complained of difficulty walking, speaking, using arms or hands.  CNS imaging demonstrated tumor within right occipital lobe, which was biopsied by Dr. Christella Noa on 07/04/19.  Following surgery, he has no new complaints or progressive deficits.  He may be taking decadron 4mg  twice per day.  Otherwise his functional status is only limited by visual impairment. Worked at [employer] and lives with his wife of 42 years.  Medications: Current Outpatient Medications on File Prior to Visit  Medication Sig Dispense Refill  . acetaminophen (TYLENOL) 325 MG tablet Take 2 tablets (650 mg total) by mouth every 6 (six) hours as needed for mild pain (or Fever >/= 101). 30 tablet 0  . amLODipine (NORVASC) 10 MG tablet Take 1 tablet (10 mg total) by mouth every evening. 30 tablet 0  . atorvastatin (LIPITOR) 80 MG tablet Take 1 tablet (80 mg total) by mouth at bedtime. 30 tablet 0  . calcium citrate (CALCITRATE - DOSED IN MG ELEMENTAL CALCIUM) 950 (200 Ca) MG tablet Take 1 tablet (200 mg of elemental calcium total) by mouth daily. 30 tablet 0  . dexamethasone (DECADRON) 2 MG tablet Take 1.5 tablets (3 mg total) by mouth 2 (two) times daily. 60 tablet 2  . levETIRAcetam (KEPPRA) 500 MG tablet  Take 2 tablets (1,000 mg total) by mouth 2 (two) times daily. 60 tablet 3  . metoprolol succinate (TOPROL-XL) 25 MG 24 hr tablet Take 0.5 tablets (12.5 mg total) by mouth every evening. 30 tablet 0  . ondansetron (ZOFRAN) 8 MG tablet Take 1 tablet (8 mg total) by mouth 2 (two) times daily as needed (nausea and vomiting). May take 30-60 minutes prior to Temodar administration if nausea/vomiting occurs. 30 tablet 1  . polyethylene glycol (MIRALAX / GLYCOLAX)  17 g packet Take 17 g by mouth daily as needed (constipation). 14 each 0  . rivaroxaban (XARELTO) 20 MG TABS tablet Take 1 tablet (20 mg total) by mouth daily with supper. (Patient not taking: Reported on 11/30/2019) 30 tablet 0  . temozolomide (TEMODAR) 140 MG capsule Take 1 capsule (140 mg total) by mouth daily. May take on an empty stomach to decrease nausea & vomiting. (Patient not taking: Reported on 11/30/2019) 5 capsule 0  . temozolomide (TEMODAR) 140 MG capsule Take 3 capsules (420 mg total) by mouth daily. May take on an empty stomach to decrease nausea & vomiting. 15 capsule 0  . temozolomide (TEMODAR) 140 MG capsule Take 1 capsule (140 mg total) by mouth daily. May take on an empty stomach to decrease nausea & vomiting. 5 capsule 0  . temozolomide (TEMODAR) 180 MG capsule Take 1 capsule (180 mg total) by mouth daily. May take on an empty stomach to decrease nausea & vomiting. (Patient not taking: Reported on 11/30/2019) 5 capsule 0  . temozolomide (TEMODAR) 180 MG capsule Take 1 capsule (180 mg total) by mouth daily. May take on an empty stomach to decrease nausea & vomiting. 5 capsule 0   No current facility-administered medications on file prior to visit.    Allergies:  Allergies  Allergen Reactions  . Lisinopril Cough   Past Medical History:  Past Medical History:  Diagnosis Date  . Anxiety   . Arthritis   . ASCVD (arteriosclerotic cardiovascular disease)    03/2004: DES x2 to the RCA; IMI with stent occlusion in 02/2005- suboptimal Plavix compliance  . CAD (coronary artery disease)   . Carcinoma of prostate ([facility])    Clopidogrel held for biopsy  . Constipation due to pain medication   . Diabetes mellitus without complication ([facility])   . Diabetic neuropathy ([facility])   . Headache 2021  . Hyperlipidemia   . Hypertension   . Mild depression ([facility])   . Morbid obesity ([facility])   . Myocardial infarction ([facility])   . Shortness of breath dyspnea   . Sleep apnea    no longer uses cpap  .  Stroke ([facility]) 2017  . Tobacco abuse    stopped smoking 06/28/09   Past Surgical History:  Past Surgical History:  Procedure Laterality Date  . APPLICATION OF CRANIAL NAVIGATION N/A 07/04/2019   Procedure: APPLICATION OF CRANIAL NAVIGATION;  Surgeon: Ashok Pall, MD;  Location: [facility];  Service: Neurosurgery;  Laterality: N/A;  . BACK SURGERY    . CARDIAC CATHETERIZATION     X 1 stent before having CABG  . CORONARY ARTERY BYPASS GRAFT  06/28/2008   X4  . CRANIOTOMY Right 07/04/2019   Procedure: Right occipital craniotomy for tumor with brainlab;  Surgeon: Ashok Pall, MD;  Location: [facility];  Service: Neurosurgery;  Laterality: Right;  . ENDARTERECTOMY Right 07/03/2015   Procedure: RIGHT CAROTID ENDARTERECTOMY WITH BOVINE PERICARDIUM PATCH ANGIOPLASTY;  Surgeon: Serafina Mitchell, MD;  Location: [facility];  Service: Vascular;  Laterality: Right;  .  LUMBAR LAMINECTOMY/DECOMPRESSION MICRODISCECTOMY N/A 09/06/2014   Procedure: LUMBER DECOMPRESSION L3-5;  Surgeon: Melina Schools, MD;  Location: [facility];  Service: Orthopedics;  Laterality: N/A;  . LUMBAR SPINE SURGERY  2002  . PROSTATE BIOPSY    . PROSTATECTOMY  02/2007  . VASECTOMY     Social History:  Social History   Socioeconomic History  . Marital status: Married    Spouse name: Not on file  . Number of children: 4  . Years of education: Not on file  . Highest education level: Not on file  Occupational History  . Occupation: Technical brewer    Comment: retired  . Occupation: Designer, industrial/product: [employer]    Comment: full time  Tobacco Use  . Smoking status: Former Smoker    Packs/day: 1.00    Years: 40.00    Pack years: 40.00    Types: Cigarettes    Quit date: 06/28/2008    Years since quitting: 11.6  . Smokeless tobacco: Former Systems developer    Quit date: 06/28/2009  Vaping Use  . Vaping Use: Never used  Substance and Sexual Activity  . Alcohol use: Yes    Alcohol/week: 3.0 standard drinks    Types: 2 Glasses of wine,  1 Cans of beer per week    Comment: may have 1 drink a week  . Drug use: No  . Sexual activity: Not on file  Other Topics Concern  . Not on file  Social History Narrative   Married with 3 sons and 1 daughter   Daily caffeine use, 3 per day   Social Determinants of Health   Financial Resource Strain:   . Difficulty of Paying Living Expenses: Not on file  Food Insecurity:   . Worried About Charity fundraiser in the Last Year: Not on file  . Ran Out of Food in the Last Year: Not on file  Transportation Needs:   . Lack of Transportation (Medical): Not on file  . Lack of Transportation (Non-Medical): Not on file  Physical Activity:   . Days of Exercise per Week: Not on file  . Minutes of Exercise per Session: Not on file  Stress:   . Feeling of Stress : Not on file  Social Connections:   . Frequency of Communication with Friends and Family: Not on file  . Frequency of Social Gatherings with Friends and Family: Not on file  . Attends Religious Services: Not on file  . Active Member of Clubs or Organizations: Not on file  . Attends Archivist Meetings: Not on file  . Marital Status: Not on file  Intimate Partner Violence:   . Fear of Current or Ex-Partner: Not on file  . Emotionally Abused: Not on file  . Physically Abused: Not on file  . Sexually Abused: Not on file   Family History:  Family History  Problem Relation Age of Onset  . Kidney disease Mother   . Stroke Father   . Breast cancer Sister        breast progressed into bone  . Colon cancer Neg Hx   . Prostate cancer Neg Hx   . Pancreatic cancer Neg Hx     Review of Systems: Constitutional: Doesn't report fevers, chills or abnormal weight loss Eyes: Doesn't report blurriness of vision Ears, nose, mouth, throat, and face: Doesn't report sore throat Respiratory: Doesn't report cough, dyspnea or wheezes Cardiovascular: Doesn't report palpitation, chest discomfort  Gastrointestinal:  Doesn't report  nausea, constipation,  diarrhea GU: Doesn't report incontinence Skin: Doesn't report skin rashes Neurological: Per HPI Musculoskeletal: Doesn't report joint pain Behavioral/Psych: +depression symptoms  Physical Exam: Vitals:   02/05/20 1144  BP: (!) 146/76  Pulse: 86  Resp: 18  Temp: (!) 96.4 F (35.8 C)  SpO2: 96%   KPS: 70. General: Alert, cooperative, pleasant, in no acute distress Head: Normal EENT: No conjunctival injection or scleral icterus.  Lungs: Resp effort normal Cardiac: Regular rate Abdomen: Non-distended abdomen Skin: No rashes cyanosis or petechiae. Extremities: mild LE edema  Neurologic Exam: Mental Status: Awake, alert, attentive to examiner. Oriented to self and environment. Language is fluent with intact comprehension.  Cranial Nerves: Visual acuity is grossly normal. Left hemianopia. Extra-ocular movements intact. No ptosis. Face is symmetric Motor: Tone and bulk are normal. Pronator drift left arm, left leg 4/5 long tract pattern. Reflexes are symmetric, no pathologic reflexes present.  Sensory: Intact to light touch Gait: Dystaxic, walker assisted  Labs: I have reviewed the data as listed    Component Value Date/Time   NA 133 (L) 01/02/2020 0922   NA 139 05/04/2019 0954   K 4.1 01/02/2020 0922   CL 97 (L) 01/02/2020 0922   CO2 30 01/02/2020 0922   GLUCOSE 173 (H) 01/02/2020 0922   BUN 20 01/02/2020 0922   BUN 11 05/04/2019 0954   CREATININE 0.75 01/02/2020 0922   CALCIUM 9.0 01/02/2020 0922   PROT 6.2 (L) 01/02/2020 0922   PROT 6.8 05/04/2019 0954   ALBUMIN 3.6 01/02/2020 0922   ALBUMIN 4.5 05/04/2019 0954   AST 21 01/02/2020 0922   ALT 40 01/02/2020 0922   ALKPHOS 80 01/02/2020 0922   BILITOT 1.8 (H) 01/02/2020 0922   GFRNONAA >60 01/02/2020 0922   GFRAA >60 01/02/2020 0922   Lab Results  Component Value Date   WBC 3.7 (L) 02/05/2020   NEUTROABS 2.8 02/05/2020   HGB 11.9 (L) 02/05/2020   HCT 35.4 (L) 02/05/2020   MCV 90.8  02/05/2020   PLT 91 (L) 02/05/2020   Assessment/Plan Glioblastoma with isocitrate dehydrogenase gene wildtype ([facility]) [C71.9]    DYAN CREELMAN presents today following cycle #3 5-day Temodar, with some clinical decline.  This could be related to draw-down of corticosteroids this month, especially given the size and edema burden of his primary tumor site.    We recommended resuming 4mg  daily dexamethasone dosing, after 3-4 days of 8mg  dose level as a boost.   We ultimately recommended continuing treatment with cycle #3 Temozolomide 150mg /m2 due to thrombocytopenia, on for five days and off for twenty three days in twenty eight day cycles. The patient will have a complete blood count performed on days 21 and 28 of each cycle, and a comprehensive metabolic panel performed on day 28 of each cycle. Labs may need to be performed more often. Zofran will prescribed for home use for nausea/vomiting.  We reviewed side effects of Temodar including nausea/vomiting, fatigue, cytopenias, constipation.  We again recommended he wait a full week before starting the next cycle, to give some time for platelets to rebound (same as last month)  Chemotherapy should be held for the following:  ANC less than 1,000  Platelets less than 100,000  LFT or creatinine greater than 2x ULN  If clinical concerns/contraindications develop  Will con't Keppra 500mg  for seizure prevention.  We ask that [name] return to clinic in 5 weeks with MRI brain for evaluation prior to cycle #5.  All questions were answered. The patient knows to  call the clinic with any problems, questions or concerns. No barriers to learning were detected.  I have spent a total of 40 minutes of face-to-face and non-face-to-face time, excluding clinical staff time, preparing to see patient, ordering tests and/or medications, counseling the patient, and independently interpreting results and communicating results to the  patient/family/caregiver     Ventura Sellers, MD Medical Director of Neuro-Oncology [facility] [place] 02/05/20 11:48 AM

## 2020-02-06 ENCOUNTER — Telehealth: Payer: Self-pay | Admitting: Internal Medicine

## 2020-02-06 ENCOUNTER — Ambulatory Visit: Payer: Medicare Other

## 2020-02-06 ENCOUNTER — Ambulatory Visit: Payer: Medicare Other | Admitting: Occupational Therapy

## 2020-02-06 ENCOUNTER — Encounter: Payer: Self-pay | Admitting: Occupational Therapy

## 2020-02-06 DIAGNOSIS — R2689 Other abnormalities of gait and mobility: Secondary | ICD-10-CM

## 2020-02-06 DIAGNOSIS — M6281 Muscle weakness (generalized): Secondary | ICD-10-CM

## 2020-02-06 DIAGNOSIS — H53462 Homonymous bilateral field defects, left side: Secondary | ICD-10-CM | POA: Diagnosis not present

## 2020-02-06 DIAGNOSIS — R2681 Unsteadiness on feet: Secondary | ICD-10-CM

## 2020-02-06 DIAGNOSIS — R278 Other lack of coordination: Secondary | ICD-10-CM

## 2020-02-06 DIAGNOSIS — R41842 Visuospatial deficit: Secondary | ICD-10-CM

## 2020-02-06 DIAGNOSIS — R42 Dizziness and giddiness: Secondary | ICD-10-CM | POA: Diagnosis not present

## 2020-02-06 DIAGNOSIS — G9340 Encephalopathy, unspecified: Secondary | ICD-10-CM | POA: Diagnosis not present

## 2020-02-06 NOTE — Therapy (Signed)
[facility] [address] [place], [place], [address] Phone: [phone]   Fax:  [phone]  Occupational Therapy Treatment  Patient Details  Name: [name] MRN: [id number] Date of Birth: [birth year] Referring Provider (OT): Dr. Bertis Ruddy   Encounter Date: 02/06/2020   OT End of Session - 02/06/20 1114    Visit Number 24    Number of Visits 25    Date for OT Re-Evaluation 02/23/20    Authorization Type MCR primary, AARP secondary    Authorization Time Period 90 days, may d/c after 8 weeks dep on progress.    Authorization - Visit Number 24    Progress Note Due on Visit 69    OT Start Time 1112    OT Stop Time 1145    OT Time Calculation (min) 33 min    Activity Tolerance Patient tolerated treatment well    Behavior During Therapy WFL for tasks assessed/performed           Past Medical History:  Diagnosis Date  . Anxiety   . Arthritis   . ASCVD (arteriosclerotic cardiovascular disease)    03/2004: DES x2 to the RCA; IMI with stent occlusion in 02/2005- suboptimal Plavix compliance  . CAD (coronary artery disease)   . Carcinoma of prostate ([place])    Clopidogrel held for biopsy  . Constipation due to pain medication   . Diabetes mellitus without complication ([place])   . Diabetic neuropathy ([place])   . Headache 2021  . Hyperlipidemia   . Hypertension   . Mild depression ([place])   . Morbid obesity ([place])   . Myocardial infarction ([place])   . Shortness of breath dyspnea   . Sleep apnea    no longer uses cpap  . Stroke ([place]) 2017  . Tobacco abuse    stopped smoking 06/28/09    Past Surgical History:  Procedure Laterality Date  . APPLICATION OF CRANIAL NAVIGATION N/A 07/04/2019   Procedure: APPLICATION OF CRANIAL NAVIGATION;  Surgeon: Ashok Pall, MD;  Location: [facility];  Service: Neurosurgery;  Laterality: N/A;  . BACK SURGERY    . CARDIAC CATHETERIZATION     X 1 stent before having CABG  .  CORONARY ARTERY BYPASS GRAFT  06/28/2008   X4  . CRANIOTOMY Right 07/04/2019   Procedure: Right occipital craniotomy for tumor with brainlab;  Surgeon: Ashok Pall, MD;  Location: [facility];  Service: Neurosurgery;  Laterality: Right;  . ENDARTERECTOMY Right 07/03/2015   Procedure: RIGHT CAROTID ENDARTERECTOMY WITH BOVINE PERICARDIUM PATCH ANGIOPLASTY;  Surgeon: Serafina Mitchell, MD;  Location: [facility];  Service: Vascular;  Laterality: Right;  . LUMBAR LAMINECTOMY/DECOMPRESSION MICRODISCECTOMY N/A 09/06/2014   Procedure: LUMBER DECOMPRESSION L3-5;  Surgeon: Melina Schools, MD;  Location: [facility];  Service: Orthopedics;  Laterality: N/A;  . LUMBAR SPINE SURGERY  2002  . PROSTATE BIOPSY    . PROSTATECTOMY  02/2007  . VASECTOMY      There were no vitals filed for this visit.   Subjective Assessment - 02/06/20 1115    Subjective  Pt denies any improvement with boost in steriods. Spouse reports some improvements. Pt denies any pain. No falls.    Patient is accompanied by: Family member   wife   Pertinent History Mark GRIEGER is a 74 y.o. 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, prostate cancer with radiation therapy prostatectomy, CAD  with CABG maintained on Xarelto, chronic diastolic congestive heart failure.  Lives with spouse two-level home he had been receiving outpatient therapies.  Presented 10/08/2019 with increasing left-sided weakness headache nausea vomiting as well as bouts of hematuria.   CT of the head showed new severe vasogenic edema throughout the posterior right cerebral hemisphere with 8 mm leftNo evidence of acute intracranial abnormality.  Follow-up medical oncology maintained on Decadron therapy.    Limitations Lt homonymous hemianopsia - no driving    Special Tests Pt goes by "[name]"    Patient Stated Goals maximize  independence    Currently in Pain? No/denies                        OT Treatments/Exercises (OP) - 02/06/20 1119      Transfers   Transfers Stand to Sit    Sit to Stand 4: Min guard    Comments ambulated with RW today with SBA      Visual/Perceptual Exercises   Pegboard semi circle peg board with scanning - mod verbal and visual cues for scanning to the left    Other reading with strategies for visual scaning. patient continuing to miss 2-3 words on left margin approx 50% of the time. patient with greater accuracy with line marker with blank paper    Scanning Tabletop    Scanning - Tabletop mod to max visual, tactile and verbal cues for use of strategies. gave patient page block with open line for scanning to prevent moving superior/inferior on lines      Fine Motor Coordination (Hand/Wrist)   Small Pegboard semi circle pegboard LUE                  OT Education - 02/06/20 1120    Education Details Education provided on remaining sessions and plan of care/discharge.    Person(s) Educated Patient;Spouse    Methods Explanation    Comprehension Verbalized understanding            OT Short Term Goals - 01/12/20 1236      OT SHORT TERM GOAL #1   Title Pt to verbalize understanding of visual compensatory strategies to improve daily function including reading and environmental scanning    Time 4    Period Weeks    Status Achieved      OT SHORT TERM GOAL #2   Title Pt to perform tabletop scanning with 90% or greater accuracy using strategies    Baseline 85% for 1.5 M number cancellation    Time 4    Period Weeks    Status On-going   53% accuracy 01/12/20     OT SHORT TERM GOAL #3   Title Pt will read 3/4 page of text with accuracy, visual aids/AE and extra time PRN    Time 4    Period Weeks    Status Achieved   met with larger print double spaced and line guide     OT SHORT TERM GOAL #4   Title Pt will perform dressing with supervision/ set up     Baseline min A for shoes/ socks    Time 4    Period Weeks    Status Achieved   pt supervision with increased time with socks.  supervision with shoes (needs help with orientation). 01/12/20     OT SHORT TERM GOAL #5   Title Pt will demonstrate improved LUE fine motor coordination for ADLs as evidenced by decreasing 9 hole peg test  score to 52 secs or less.    Baseline RUE 33 secs, LUE 58.25    Time 4    Period Weeks    Status Achieved   01/12/20 - 49.31 seconds     OT SHORT TERM GOAL #6   Title Pt will demonstrate ability to retrieve a llightweight object at 130 shoulder flexion with LUE demonstrating good control.    Baseline shoulder flexion RUE 140, LUE 120    Time 4    Period Weeks    Status Achieved   11/21/19:  130*            OT Long Term Goals - 01/12/20 1246      OT LONG TERM GOAL #1   Title Pt will perform environmental scanning at 90% accuracy in mod distracting environment    Time 12    Period Weeks    Status New      OT LONG TERM GOAL #2   Title Pt to read full page of text or greater with visual aids/AE prn    Time 12    Period Weeks    Status New      OT LONG TERM GOAL #3   Title Pt will demonstrate improved fine motor coordination in LUE for ADLS as evidenced by decreasing 9 hole peg test score to 48 secs or less.    Baseline Pt will perform all basic ADLS with distant supervision.    Time 12    Period Weeks    Status On-going   LUE 49.31 seconds - 01/12/20     OT LONG TERM GOAL #4   Title Pt will perform basic home management tasks with supervision demonstrating good safety awareness.    Time 12    Period Weeks    Status Deferred   pt is not performing any home management and not appropriate at this time. 01/12/20     OT LONG TERM GOAL #5   Title Pt will negotiate in kitchen with good safety precautions and utilize visual strategies to locate preferred item with supervision. (new goal at renewal)    Time 6    Period Weeks    Status New    Target Date  02/23/20                 Plan - 02/06/20 1115    Clinical Impression Statement Pt with a little bit of increased steadiness and endurance since steroid increase. Education provided on discharge plans for next session at this time.    OT Occupational Profile and History Detailed Assessment- Review of Records and additional review of physical, cognitive, psychosocial history related to current functional performance    Occupational performance deficits (Please refer to evaluation for details): IADL's;Work;Leisure;Social Participation;ADL's    Body Structure / Function / Physical Skills IADL;Endurance;Balance;Vision;ADL;Mobility;Strength;UE functional use;FMC;Coordination;Gait;GMC;ROM;Decreased knowledge of use of DME;Dexterity    Cognitive Skills Memory;Problem Solve;Perception;Thought;Safety Awareness    Rehab Potential Good    Comorbidities impacting occupational performance description: glioblastoma - pt has begun radiation    OT Frequency 1x / week   2x/week for 2 weeks and 1x/week for 4 weeks   OT Duration 6 weeks    OT Treatment/Interventions Self-care/ADL training;Functional Mobility Training;Therapeutic activities;Coping strategies training;Visual/perceptual remediation/compensation;Patient/family education;Cognitive remediation/compensation;Therapeutic exercise;Balance training;Manual Therapy;Neuromuscular education;Aquatic Therapy;Energy conservation;DME and/or AE instruction;Paraffin;Fluidtherapy;Gait Training;Passive range of motion;Moist Heat    Plan review home safety and precautions, discharge    Consulted and Agree with Plan of Care Patient;Family member/caregiver    Family Member Consulted wife  Patient will benefit from skilled therapeutic intervention in order to improve the following deficits and impairments:   Body Structure / Function / Physical Skills: IADL, Endurance, Balance, Vision, ADL, Mobility, Strength, UE functional use, FMC, Coordination, Gait,  GMC, ROM, Decreased knowledge of use of DME, Dexterity Cognitive Skills: Memory, Problem Solve, Perception, Thought, Safety Awareness     Visit Diagnosis: Other abnormalities of gait and mobility  Muscle weakness (generalized)  Unsteadiness on feet  Visuospatial deficit  Other lack of coordination  Homonymous hemianopsia, left    Problem List Patient Active Problem List   Diagnosis Date Noted  . 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 Dyslipidemia   . Seizure prophylaxis   . Chronic diastolic congestive heart failure ([place])   . Diabetic peripheral neuropathy ([place])   . Palliative care by specialist   . Weakness 10/08/2019  . Vasogenic edema ([place]) 10/08/2019  . Gross hematuria 10/08/2019  . DNR (do not resuscitate) discussion 07/13/2019  . S/P craniotomy 07/04/2019  . 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 Respiratory failure ([place])   . Encephalopathy acute   . Brain mass 07/01/2019  . Fever 07/01/2019  . Atherosclerosis of native artery of both lower extremities with intermittent claudication ([place]) 08/23/2018  . Coronary artery disease without angina pectoris 08/23/2018  . Dyspnea on exertion 08/23/2018  . Biochemically recurrent malignant neoplasm of prostate ([place]) 08/16/2018  . S/P carotid endarterectomy 08/20/2015  . Essential hypertension 08/20/2015  . Type 2 diabetes mellitus with circulatory disorder ([place]) 08/20/2015  . HLD (hyperlipidemia) 08/20/2015  . Carotid artery stenosis, symptomatic 07/03/2015  . Carotid stenosis, right   . TIA (transient ischemic attack) 06/18/2015  . ASCVD (arteriosclerotic cardiovascular disease) 06/18/2015  . Diabetes mellitus without complication ([place]) 09/64/3838  . Resistant hypertension 06/18/2015  . Hyperlipidemia 06/18/2015  . Spinal stenosis of lumbar region with neurogenic claudication 09/06/2014  . Atherosclerosis of native artery of extremity with intermittent claudication ([place])  03/09/2013  . HYPERLIPIDEMIA 04/08/2009  . TOBACCO ABUSE 04/08/2009  . ATHEROSCLEROTIC CARDIOVASCULAR DISEASE 04/08/2009  . INTERMITTENT VERTIGO 04/08/2009  . ADENOCARCINOMA, PROSTATE 03/21/2009  . ABDOMINAL PAIN -GENERALIZED 03/21/2009  . History of coronary artery bypass graft 06/28/2008    Zachery Conch MOT, OTR/L  02/06/2020, 12:32 PM  [facility] [address] [place], [place], [address] Phone: [phone]   Fax:  [phone]  Name: AADIN GAUT MRN: [id number] Date of Birth: 1946/03/04

## 2020-02-06 NOTE — Therapy (Signed)
[facility] [address] [place], [place], [address] Phone: [phone]   Fax:  [phone]  Physical Therapy Treatment  Patient Details  Name: [name] MRN: [id number] Date of Birth: 09/22/1945 Referring Provider (PT): [name]   Encounter Date: 02/06/2020   PT End of Session - 02/06/20 1152    Visit Number 23    Number of Visits 26    Date for PT Re-Evaluation 02/13/20    Authorization Type 10 V PN on 10/13/92; recert 8/54/62 (2x/wek for 4 weeks, 1x/week for 4 more weeks - total 8 weeks), 20V PN on 01/19/20    Progress Note Due on Visit 28    PT Start Time 1146    PT Stop Time 1228    PT Time Calculation (min) 42 min    Equipment Utilized During Treatment Gait belt    Activity Tolerance Patient tolerated treatment well;Patient limited by fatigue    Behavior During Therapy WFL for tasks assessed/performed           Past Medical History:  Diagnosis Date  . Anxiety   . Arthritis   . ASCVD (arteriosclerotic cardiovascular disease)    03/2004: DES x2 to the RCA; IMI with stent occlusion in 02/2005- suboptimal Plavix compliance  . CAD (coronary artery disease)   . Carcinoma of prostate ([place])    Clopidogrel held for biopsy  . Constipation due to pain medication   . Diabetes mellitus without complication ([place])   . Diabetic neuropathy ([place])   . Headache 2021  . Hyperlipidemia   . Hypertension   . Mild depression ([place])   . Morbid obesity ([place])   . Myocardial infarction ([place])   . Shortness of breath dyspnea   . Sleep apnea    no longer uses cpap  . Stroke ([place]) 2017  . Tobacco abuse    stopped smoking 06/28/09    Past Surgical History:  Procedure Laterality Date  . APPLICATION OF CRANIAL NAVIGATION N/A 07/04/2019   Procedure: APPLICATION OF CRANIAL NAVIGATION;  Surgeon: Ashok Pall, MD;  Location: [facility];  Service: Neurosurgery;  Laterality: N/A;  . BACK SURGERY    . CARDIAC CATHETERIZATION      X 1 stent before having CABG  . CORONARY ARTERY BYPASS GRAFT  06/28/2008   X4  . CRANIOTOMY Right 07/04/2019   Procedure: Right occipital craniotomy for tumor with brainlab;  Surgeon: Ashok Pall, MD;  Location: [facility];  Service: Neurosurgery;  Laterality: Right;  . ENDARTERECTOMY Right 07/03/2015   Procedure: RIGHT CAROTID ENDARTERECTOMY WITH BOVINE PERICARDIUM PATCH ANGIOPLASTY;  Surgeon: Serafina Mitchell, MD;  Location: [facility];  Service: Vascular;  Laterality: Right;  . LUMBAR LAMINECTOMY/DECOMPRESSION MICRODISCECTOMY N/A 09/06/2014   Procedure: LUMBER DECOMPRESSION L3-5;  Surgeon: Melina Schools, MD;  Location: [facility];  Service: Orthopedics;  Laterality: N/A;  . LUMBAR SPINE SURGERY  2002  . PROSTATE BIOPSY    . PROSTATECTOMY  02/2007  . VASECTOMY      There were no vitals filed for this visit.   Subjective Assessment - 02/06/20 1155    Subjective Patient got COVID booster and Flu Shot yesterday. Patient reports feeling well this morning. No falls or pain to report.    Patient is accompained by: Family member    Pertinent History PMH: Rt occipital craniotomy 07/04/19 secondary to glioblastoma. PMH: Prostate CA, CVA 2017 (no residual deficits), CAD, DM, HTN, hx of 2 lumbar surgeries 2002, 2016 (laminectomies)    Limitations Standing;Walking  How long can you sit comfortably? no issues    How long can you stand comfortably? 5 min    How long can you walk comfortably? 5 min    Patient Stated Goals walk better    Currently in Pain? No/denies                             [facility] Adult PT Treatment/Exercise - 02/06/20 1202      Transfers   Transfers Sit to Stand;Stand to Sit    Sit to Stand 4: Min guard    Sit to Stand Details Verbal cues for technique;Verbal cues for sequencing;Verbal cues for safe use of DME/AE    Five time sit to stand comments  completed 5x sit <> stands 31.48 seconds with BUE support from mat at standard chair height.     Stand to Sit 4: Min guard     Comments completed sit <> stands from mat 2 x 5 reps with BUE support. verbal cues for improved posture upon standing. patient demo improved completion of sit <> stand today.       Ambulation/Gait   Ambulation/Gait Yes    Ambulation/Gait Assistance 4: Min guard    Ambulation/Gait Assistance Details completed ambulation around therapy gym with RW x 115 ft and CGA throughout, patient require intermittent verbal cues for improved step length    Ambulation Distance (Feet) 115 Feet    Assistive device Rolling walker    Gait Pattern Decreased stance time - left;Decreased stride length;Decreased hip/knee flexion - left;Decreased dorsiflexion - left;Antalgic;Decreased trunk rotation;Wide base of support;Poor foot clearance - left    Ambulation Surface Level;Indoor      Self-Care   Self-Care Other Self-Care Comments    Other Self-Care Comments  Reviewed Bed Mobility with patient/wife, reporting that they have improved ability to complete bed mobility with log roll technique.       Neuro Re-ed    Neuro Re-ed Details  Standing with feet apart on firm surface completed reaching task for cones on L side to promote improved weight shift to L, completed 3 x 6 cones. CGA as needed and verbal cues for posture      Exercises   Exercises Other Exercises    Other Exercises  Completed exercises seated on green air disc including the following: loft/chops with BUE support x 10 reps each direction, completed alteranting LAQ x 10 rep sbilaterally increased dififculty noted with balance with completion of LAQ, as well as alternating marching x 10 reps verbal cues for improved posture with completion.                     PT Short Term Goals - 01/09/20 1412      PT SHORT TERM GOAL #1   Title Pt will be able to perform 5x sit to stand under 30 seconds with bil UE to improve functional strength from standard chair.    Baseline Pt unable to perform them (10/24/19); unable to without UE support (11/24/19);  attempted with one UE but pt only able to complete 1 rep with multiple tries; pt was able to stand up using one UE on chair and second hand on walker (12/12/19); pt able to complete 3 reps with R UE on chair and L UE on walker but unable to complete 5 reps (12/19/19); bil UE use 42 seconds; bil UE use 45 seconds (01/09/20)    Time 4    Period Weeks  Status Revised    Target Date 01/16/20      PT SHORT TERM GOAL #2   Title Patient will be able to ambulate for 10 min without rest with use of his walker to improve walking endurnace    Baseline <5 min (10/24/19); 5-10 min (11/29/19);    Time 4    Period Weeks    Status Achieved    Target Date 11/21/19      PT SHORT TERM GOAL #3   Title Pt will demo 4 points improvement on Berg balance scale to improve overall static balance    Baseline 37/56 (10/24/19); 8/6 42/56; 01/09/20 46/56    Time 4    Period Weeks    Status Achieved    Target Date 11/21/19             PT Long Term Goals - 02/06/20 1316      PT LONG TERM GOAL #1   Title Pt will demo 47/56 or better on BBS to improve balance and reduce fall risk    Baseline 37/26 (10/24/19);11/24/19 42/56; 12/19/19 47/56    Time 8    Period Weeks    Status Achieved      PT LONG TERM GOAL #2   Title Patient will be able to perform sit to stand in under 25 seconds to improve overall strength    Baseline 31.48 secs for 5 x sit <> stand with BUE support    Time 8    Period Weeks    Status Not Met      PT LONG TERM GOAL #3   Title Patient will be able to ambulate 48' with small based quad cane and SBA to improve short distance ambulation    Baseline quad cane 115' (12/19/19)    Time 8    Period Weeks    Status Revised      PT LONG TERM GOAL #4   Title Pt will demo improvement by 0.15 m/s with use of st. cane to improve functional gait velocity    Baseline 0.6m/s with cane, 0.35m/s with rolling walker (eval)    Time 8    Status Revised                 Plan - 02/06/20 1317    Clinical  Impression Statement Today's skilled PT session included continued activities to promote improved standing and seated balance, including reaching for objects. Patient continue to demo difficulty with activities on L side with requiring verbal cues from PT for completion. Planned on D/C at next scheduled visit to take break from therapy services at this time. patient was able to complete 5x sit <> stand in 31.48 secs today with BUE support.    Personal Factors and Comorbidities Comorbidity 3+    Comorbidities craniotomy secondary to glioblastoma, hx of lumbar surgeries x 2, CABG x 4    Examination-Activity Limitations Squat;Stairs;Locomotion Level;Caring for Others;Carry    Examination-Participation Restrictions Cleaning;Community Activity;Driving;Laundry;Yard Work;Shop    Stability/Clinical Decision Making Unstable/Unpredictable    Rehab Potential Good    PT Frequency Other (comment)   2x week for 4 weeks and 1x/week for 4 more weeks for total of 8 weeks.   PT Duration 8 weeks    PT Treatment/Interventions ADLs/Self Care Home Management;Gait training;Stair training;Functional mobility training;Therapeutic activities;Therapeutic exercise;Balance training;Neuromuscular re-education;Vestibular;Visual/perceptual remediation/compensation;Manual techniques;Energy conservation;Joint Manipulations;Patient/family education;Passive range of motion    PT Next Visit Plan check goals + discharge    PT Home Exercise Plan Access Code: [email or web address], Access Code:  [email or web address]: [email or web address]: 10/05/2021Prepared by: Gwenyth Bouillon PatelExercisesHooklying Single Knee to Chest Stretch - 1 x daily - 7 x weekly - 10 reps - 10 holdSupine Lower Trunk Rotation - 1 x daily - 7 x weekly - 20 reps    Consulted and Agree with Plan of Care Patient;Family member/caregiver    Family Member Consulted wife           Patient will benefit from skilled therapeutic intervention in order to improve the following deficits  and impairments:  Abnormal gait, Decreased activity tolerance, Decreased balance, Decreased mobility, Decreased endurance, Difficulty walking, Dizziness, Impaired vision/preception  Visit Diagnosis: Other abnormalities of gait and mobility  Muscle weakness (generalized)  Unsteadiness on feet  Other lack of coordination     Problem List Patient Active Problem List   Diagnosis Date Noted  . Glioblastoma multiforme ([place]) 10/11/2019  . Dyslipidemia   . Seizure prophylaxis   . Chronic diastolic congestive heart failure ([place])   . Diabetic peripheral neuropathy ([place])   . Palliative care by specialist   . Weakness 10/08/2019  . Vasogenic edema ([place]) 10/08/2019  . Gross hematuria 10/08/2019  . DNR (do not resuscitate) discussion 07/13/2019  . S/P craniotomy 07/04/2019  . Glioblastoma with isocitrate dehydrogenase gene wildtype ([place]) 07/04/2019  . Respiratory failure ([place])   . Encephalopathy acute   . Brain mass 07/01/2019  . Fever 07/01/2019  . Atherosclerosis of native artery of both lower extremities with intermittent claudication ([place]) 08/23/2018  . Coronary artery disease without angina pectoris 08/23/2018  . Dyspnea on exertion 08/23/2018  . Biochemically recurrent malignant neoplasm of prostate ([place]) 08/16/2018  . S/P carotid endarterectomy 08/20/2015  . Essential hypertension 08/20/2015  . Type 2 diabetes mellitus with circulatory disorder ([place]) 08/20/2015  . HLD (hyperlipidemia) 08/20/2015  . Carotid artery stenosis, symptomatic 07/03/2015  . Carotid stenosis, right   . TIA (transient ischemic attack) 06/18/2015  . ASCVD (arteriosclerotic cardiovascular disease) 06/18/2015  . Diabetes mellitus without complication ([place]) 04/79/9872  . Resistant hypertension 06/18/2015  . Hyperlipidemia 06/18/2015  . Spinal stenosis of lumbar region with neurogenic claudication 09/06/2014  . Atherosclerosis of native artery of extremity with intermittent claudication ([place]) 03/09/2013  .  HYPERLIPIDEMIA 04/08/2009  . TOBACCO ABUSE 04/08/2009  . ATHEROSCLEROTIC CARDIOVASCULAR DISEASE 04/08/2009  . INTERMITTENT VERTIGO 04/08/2009  . ADENOCARCINOMA, PROSTATE 03/21/2009  . ABDOMINAL PAIN -GENERALIZED 03/21/2009  . History of coronary artery bypass graft 06/28/2008    Jones Bales, PT, DPT 02/06/2020, 1:19 PM  [facility] [address] [place], [place], [address] Phone: [phone]   Fax:  [phone]  Name: PASHA BROAD MRN: [id number] Date of Birth: 1946/02/03

## 2020-02-06 NOTE — Telephone Encounter (Signed)
Scheduled per 10/18 los. Pt is aware of appt times and date. 

## 2020-02-07 ENCOUNTER — Other Ambulatory Visit: Payer: Self-pay | Admitting: Radiation Therapy

## 2020-02-09 ENCOUNTER — Telehealth: Payer: Self-pay | Admitting: *Deleted

## 2020-02-09 NOTE — Telephone Encounter (Signed)
Patients wife called to advise that patient has had blood in urine yesterday and again today with small clots.  Patient denies having any pain with urination, no odor to the urine, denies increased frequency, or urgency.  No history of kidney stones.  Denies fever.  Stopped Xarelto previously from the direction of [name].   Wanting guidance.  Encouraged patient to reach out to PCP to see if they can get appointment today, if not then to have patient to call me back and we could order urinalysis at least today.    Pending call back from patients wife.

## 2020-02-12 MED FILL — TEMOZOLOMIDE 140 MG CAPS: 140 | 28 days supply | Qty: 5 | Fill #0

## 2020-02-12 MED FILL — TEMOZOLOMIDE 180 MG CAPS: 180 | 28 days supply | Qty: 5 | Fill #0

## 2020-02-13 ENCOUNTER — Ambulatory Visit: Payer: Medicare Other

## 2020-02-13 ENCOUNTER — Other Ambulatory Visit: Payer: Self-pay

## 2020-02-13 ENCOUNTER — Ambulatory Visit: Payer: Medicare Other | Admitting: Occupational Therapy

## 2020-02-13 DIAGNOSIS — C61 Malignant neoplasm of prostate: Secondary | ICD-10-CM | POA: Diagnosis not present

## 2020-02-13 DIAGNOSIS — M6281 Muscle weakness (generalized): Secondary | ICD-10-CM | POA: Diagnosis not present

## 2020-02-13 DIAGNOSIS — R2689 Other abnormalities of gait and mobility: Secondary | ICD-10-CM

## 2020-02-13 DIAGNOSIS — R41844 Frontal lobe and executive function deficit: Secondary | ICD-10-CM

## 2020-02-13 DIAGNOSIS — R41840 Attention and concentration deficit: Secondary | ICD-10-CM

## 2020-02-13 DIAGNOSIS — G9340 Encephalopathy, unspecified: Secondary | ICD-10-CM | POA: Diagnosis not present

## 2020-02-13 DIAGNOSIS — H53462 Homonymous bilateral field defects, left side: Secondary | ICD-10-CM | POA: Diagnosis not present

## 2020-02-13 DIAGNOSIS — R2681 Unsteadiness on feet: Secondary | ICD-10-CM | POA: Diagnosis not present

## 2020-02-13 DIAGNOSIS — R278 Other lack of coordination: Secondary | ICD-10-CM

## 2020-02-13 DIAGNOSIS — R41842 Visuospatial deficit: Secondary | ICD-10-CM

## 2020-02-13 DIAGNOSIS — R42 Dizziness and giddiness: Secondary | ICD-10-CM | POA: Diagnosis not present

## 2020-02-13 DIAGNOSIS — R310 Gross hematuria: Secondary | ICD-10-CM | POA: Diagnosis not present

## 2020-02-13 NOTE — Therapy (Signed)
[facility] [address] [place], [place], [address] Phone: [phone]   Fax:  [phone]  Physical Therapy Treatment/Discharge Summary  Patient Details  Name: [name] MRN: [id number] Date of Birth: 11/15/1945 Referring Provider (PT): [name]  PHYSICAL THERAPY DISCHARGE SUMMARY  Visits from Start of Care: 24  Current functional level related to goals / functional outcomes: See Clinical Impression Statement. Patient/wife reporting feeling comfortable with current functional level at this time and maintaining gains upon discharge.    Remaining deficits: Decreased Strength, Increased Fall Risk   Education / Equipment: Educated on [employer] program  Plan: Patient agrees to discharge.  Patient goals were partially met. Patient is being discharged due to being pleased with the current functional level.  ?????          Encounter Date: 02/13/2020   PT End of Session - 02/13/20 1151    Visit Number 24    Number of Visits 26    Date for PT Re-Evaluation 02/13/20    Authorization Type 10 V PN on 0/08/67; recert 10/06/48 (2x/wek for 4 weeks, 1x/week for 4 more weeks - total 8 weeks), 20V PN on 01/19/20    Progress Note Due on Visit 28    PT Start Time 1145    PT Stop Time 1216    PT Time Calculation (min) 31 min    Equipment Utilized During Treatment Gait belt    Activity Tolerance Patient tolerated treatment well;Patient limited by fatigue    Behavior During Therapy WFL for tasks assessed/performed           Past Medical History:  Diagnosis Date  . Anxiety   . Arthritis   . ASCVD (arteriosclerotic cardiovascular disease)    03/2004: DES x2 to the RCA; IMI with stent occlusion in 02/2005- suboptimal Plavix compliance  . CAD (coronary artery disease)   . Carcinoma of prostate ([place])    Clopidogrel held for biopsy  . Constipation due to pain medication   . Diabetes mellitus without complication  ([place])   . Diabetic neuropathy ([place])   . Headache 2021  . Hyperlipidemia   . Hypertension   . Mild depression ([place])   . Morbid obesity ([place])   . Myocardial infarction ([place])   . Shortness of breath dyspnea   . Sleep apnea    no longer uses cpap  . Stroke ([place]) 2017  . Tobacco abuse    stopped smoking 06/28/09    Past Surgical History:  Procedure Laterality Date  . APPLICATION OF CRANIAL NAVIGATION N/A 07/04/2019   Procedure: APPLICATION OF CRANIAL NAVIGATION;  Surgeon: Ashok Pall, MD;  Location: [facility];  Service: Neurosurgery;  Laterality: N/A;  . BACK SURGERY    . CARDIAC CATHETERIZATION     X 1 stent before having CABG  . CORONARY ARTERY BYPASS GRAFT  06/28/2008   X4  . CRANIOTOMY Right 07/04/2019   Procedure: Right occipital craniotomy for tumor with brainlab;  Surgeon: Ashok Pall, MD;  Location: [facility];  Service: Neurosurgery;  Laterality: Right;  . ENDARTERECTOMY Right 07/03/2015   Procedure: RIGHT CAROTID ENDARTERECTOMY WITH BOVINE PERICARDIUM PATCH ANGIOPLASTY;  Surgeon: Serafina Mitchell, MD;  Location: [facility];  Service: Vascular;  Laterality: Right;  . LUMBAR LAMINECTOMY/DECOMPRESSION MICRODISCECTOMY N/A 09/06/2014   Procedure: LUMBER DECOMPRESSION L3-5;  Surgeon: Melina Schools, MD;  Location: [facility];  Service: Orthopedics;  Laterality: N/A;  . LUMBAR SPINE SURGERY  2002  . PROSTATE BIOPSY    . PROSTATECTOMY  02/2007  .  VASECTOMY      There were no vitals filed for this visit.   Subjective Assessment - 02/13/20 1149    Subjective Wife reports patient had blood in urine over weekend. had urine analysis this morning and it come back normal. has not recieved blood work results yet. No falls. Reports feeling well today, no pain.    Patient is accompained by: Family member    Pertinent History PMH: Rt occipital craniotomy 07/04/19 secondary to glioblastoma. PMH: Prostate CA, CVA 2017 (no residual deficits), CAD, DM, HTN, hx of 2 lumbar surgeries 2002, 2016 (laminectomies)     Limitations Standing;Walking    How long can you sit comfortably? no issues    How long can you stand comfortably? 5 min    How long can you walk comfortably? 5 min    Patient Stated Goals walk better    Currently in Pain? No/denies             [facility] Adult PT Treatment/Exercise - 02/13/20 0001      Transfers   Transfers Sit to Stand;Stand to Sit    Sit to Stand 4: Min guard    Sit to Stand Details Verbal cues for technique;Verbal cues for sequencing;Verbal cues for safe use of DME/AE    Five time sit to stand comments  5x sit <> stand: 29.71 secs with BUE support from mat at standard height    Stand to Sit 5: Supervision      Ambulation/Gait   Ambulation/Gait Yes    Ambulation/Gait Assistance 5: Supervision;4: Min guard    Ambulation/Gait Assistance Details completed ambulation with RW x 230 ft without rest break. Verbal cues for negotiation as decreased scanning to L side. increased fatigue reported require seated rest break after ambulation    Ambulation Distance (Feet) 230 Feet    Assistive device Rolling walker    Gait Pattern Decreased stance time - left;Decreased stride length;Decreased hip/knee flexion - left;Decreased dorsiflexion - left;Antalgic;Decreased trunk rotation;Wide base of support;Poor foot clearance - left    Ambulation Surface Level;Indoor    Gait velocity 17.13 secs = 0.58 m/s      Exercises   Exercises Other Exercises    Other Exercises  Reviewed current HEP and educated patient on importance of compliance. Provided HEP tracker, and problem solved patient to promote compliance. PT educating on breaking exercises up and alternating days to promote improved completion. Wife/patient verbalizing understanding.            Reviewed the following HEP:    Access Code: [email or web address] URL: [email or web address] Date: 02/13/2020 Prepared by: Baldomero Lamy  Exercises Sit to Stand - 1 x daily - 5 x weekly - 1 sets - 5-10 reps Heel rises with counter  support - 1 x daily - 5 x weekly - 1 sets - 10 reps Standing March with Counter Support - 1 x daily - 5 x weekly - 1 sets - 10 reps Mini Squat with Counter Support - 1 x daily - 5 x weekly - 1 sets - 5-10 reps Wide Stance with Counter Support - 1 x daily - 5 x weekly - 1 sets - 3 reps - 30 hold  Provided Tracker to help with compliance with HEP.        PT Education - 02/13/20 1213    Education Details Progress toward LTG's, Compliance with HEP. HEP tracker to help with compliance.    Person(s) Educated Patient;Spouse    Methods Explanation;Demonstration;Handout    Comprehension Verbalized understanding  PT Short Term Goals - 01/09/20 1412      PT SHORT TERM GOAL #1   Title Pt will be able to perform 5x sit to stand under 30 seconds with bil UE to improve functional strength from standard chair.    Baseline Pt unable to perform them (10/24/19); unable to without UE support (11/24/19); attempted with one UE but pt only able to complete 1 rep with multiple tries; pt was able to stand up using one UE on chair and second hand on walker (12/12/19); pt able to complete 3 reps with R UE on chair and L UE on walker but unable to complete 5 reps (12/19/19); bil UE use 42 seconds; bil UE use 45 seconds (01/09/20)    Time 4    Period Weeks    Status Revised    Target Date 01/16/20      PT SHORT TERM GOAL #2   Title Patient will be able to ambulate for 10 min without rest with use of his walker to improve walking endurnace    Baseline <5 min (10/24/19); 5-10 min (11/29/19);    Time 4    Period Weeks    Status Achieved    Target Date 11/21/19      PT SHORT TERM GOAL #3   Title Pt will demo 4 points improvement on Berg balance scale to improve overall static balance    Baseline 37/56 (10/24/19); 8/6 42/56; 01/09/20 46/56    Time 4    Period Weeks    Status Achieved    Target Date 11/21/19             PT Long Term Goals - 02/13/20 1201      PT LONG TERM GOAL #1   Title Pt will demo  47/56 or better on BBS to improve balance and reduce fall risk    Baseline 37/26 (10/24/19);11/24/19 42/56; 12/19/19 47/56    Time 8    Period Weeks    Status Achieved      PT LONG TERM GOAL #2   Title Patient will be able to perform sit to stand in under 25 seconds to improve overall strength    Baseline 31.48 secs for 5 x sit <> stand with BUE support, 29.71 secss with BUE support    Time 8    Period Weeks    Status Not Met      PT LONG TERM GOAL #3   Title Patient will be able to ambulate 33' with small based quad cane and SBA to improve short distance ambulation    Baseline 230' ft with RW for SBA for safety    Time 8    Period Weeks    Status Partially Met      PT LONG TERM GOAL #4   Title Pt will demo improvement by 0.15 m/s with use of st. cane to improve functional gait velocity    Baseline 0.69m/s with cane, 0.48m/s with rolling walker (eval), 0.58 m/s with RW    Time 8    Status Partially Met                 Plan - 02/13/20 1223    Clinical Impression Statement Today's skilled PT session included assessment of patient's progress toward LTG. Patient able to meet LTG #1 and 4, partially meet LTG #3, and made progress toward LTG #2. Patient has demonstrated progress with gait speed ambulating at 0.58 m/s, but still at increased fall risk. Patient/wife verbalizing taking break  from therapy services currently. Patient did demonstrate progress with 5x sit <> stand as able to complete in 29.71 secs today, but unable to meet LTG associated. PT educating on importance of walking daily and compliance with HEP to maintain progress achieved with PT services. Patient and wife verbalizing understanding and readiness for discharge today.    Personal Factors and Comorbidities Comorbidity 3+    Comorbidities craniotomy secondary to glioblastoma, hx of lumbar surgeries x 2, CABG x 4    Examination-Activity Limitations Squat;Stairs;Locomotion Level;Caring for Others;Carry     Examination-Participation Restrictions Cleaning;Community Activity;Driving;Laundry;Yard Work;Shop    Stability/Clinical Decision Making Unstable/Unpredictable    Rehab Potential Good    PT Frequency Other (comment)   2x week for 4 weeks and 1x/week for 4 more weeks for total of 8 weeks.   PT Duration 8 weeks    PT Treatment/Interventions ADLs/Self Care Home Management;Gait training;Stair training;Functional mobility training;Therapeutic activities;Therapeutic exercise;Balance training;Neuromuscular re-education;Vestibular;Visual/perceptual remediation/compensation;Manual techniques;Energy conservation;Joint Manipulations;Patient/family education;Passive range of motion    PT Home Exercise Plan Access Code: [email or web address], Access Code: [email or web address]: [email or web address]: 10/05/2021Prepared by: Gwenyth Bouillon PatelExercisesHooklying Single Knee to Chest Stretch - 1 x daily - 7 x weekly - 10 reps - 10 holdSupine Lower Trunk Rotation - 1 x daily - 7 x weekly - 20 reps    Consulted and Agree with Plan of Care Patient;Family member/caregiver    Family Member Consulted wife           Patient will benefit from skilled therapeutic intervention in order to improve the following deficits and impairments:  Abnormal gait, Decreased activity tolerance, Decreased balance, Decreased mobility, Decreased endurance, Difficulty walking, Dizziness, Impaired vision/preception  Visit Diagnosis: Other abnormalities of gait and mobility  Muscle weakness (generalized)  Unsteadiness on feet  Other lack of coordination     Problem List Patient Active Problem List   Diagnosis Date Noted  . 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 Dyslipidemia   . Seizure prophylaxis   . Chronic diastolic congestive heart failure ([place])   . Diabetic peripheral neuropathy ([place])   . Palliative care by specialist   . Weakness 10/08/2019  . Vasogenic edema ([place]) 10/08/2019  . Gross hematuria 10/08/2019  . DNR (do not  resuscitate) discussion 07/13/2019  . S/P craniotomy 07/04/2019  . 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 Respiratory failure ([place])   . Encephalopathy acute   . Brain mass 07/01/2019  . Fever 07/01/2019  . Atherosclerosis of native artery of both lower extremities with intermittent claudication ([place]) 08/23/2018  . Coronary artery disease without angina pectoris 08/23/2018  . Dyspnea on exertion 08/23/2018  . 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 S/P carotid endarterectomy 08/20/2015  . Essential hypertension 08/20/2015  . 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 HLD (hyperlipidemia) 08/20/2015  . Carotid artery stenosis, symptomatic 07/03/2015  . Carotid stenosis, right   . TIA (transient ischemic attack) 06/18/2015  . ASCVD (arteriosclerotic cardiovascular disease) 06/18/2015  . Diabetes mellitus without complication ([place]) 29/56/2130  . Resistant hypertension 06/18/2015  . Hyperlipidemia 06/18/2015  . Spinal stenosis of lumbar region with neurogenic claudication 09/06/2014  . Atherosclerosis of native artery of extremity with intermittent claudication ([place]) 03/09/2013  . HYPERLIPIDEMIA 04/08/2009  . TOBACCO ABUSE 04/08/2009  . ATHEROSCLEROTIC CARDIOVASCULAR DISEASE 04/08/2009  . INTERMITTENT VERTIGO 04/08/2009  . ADENOCARCINOMA, PROSTATE 03/21/2009  . ABDOMINAL PAIN -GENERALIZED 03/21/2009  . History of coronary artery bypass graft 06/28/2008    Jones Bales, PT, DPT 02/13/2020, 12:25  PM  [facility] [address] [place], [place], [address] Phone: [phone]   Fax:  [phone]  Name: [name] MRN: [id number] Date of Birth: 04/18/1946

## 2020-02-13 NOTE — Therapy (Signed)
[facility] [address] [place], [place], [address] Phone: [phone]   Fax:  [phone]  Occupational Therapy Treatment  Patient Details  Name: [name] MRN: [id number] Date of Birth: 1946-02-24 Referring Provider (OT): Dr. Bertis Ruddy   Encounter Date: 02/13/2020   OT End of Session - 02/13/20 1112    Visit Number 25    Number of Visits 25    Date for OT Re-Evaluation 02/23/20    Authorization Type MCR primary, AARP secondary    Authorization Time Period 90 days, may d/c after 8 weeks dep on progress.    Authorization - Visit Number 25    Progress Note Due on Visit 29   pt late   OT Start Time 1110    OT Stop Time 1145    OT Time Calculation (min) 35 min    Activity Tolerance Patient tolerated treatment well    Behavior During Therapy WFL for tasks assessed/performed           Past Medical History:  Diagnosis Date  . Anxiety   . Arthritis   . ASCVD (arteriosclerotic cardiovascular disease)    03/2004: DES x2 to the RCA; IMI with stent occlusion in 02/2005- suboptimal Plavix compliance  . CAD (coronary artery disease)   . Carcinoma of prostate ([place])    Clopidogrel held for biopsy  . Constipation due to pain medication   . Diabetes mellitus without complication ([place])   . Diabetic neuropathy ([place])   . Headache 2021  . Hyperlipidemia   . Hypertension   . Mild depression ([place])   . Morbid obesity ([place])   . Myocardial infarction ([place])   . Shortness of breath dyspnea   . Sleep apnea    no longer uses cpap  . Stroke ([place]) 2017  . Tobacco abuse    stopped smoking 06/28/09    Past Surgical History:  Procedure Laterality Date  . APPLICATION OF CRANIAL NAVIGATION N/A 07/04/2019   Procedure: APPLICATION OF CRANIAL NAVIGATION;  Surgeon: Ashok Pall, MD;  Location: [facility];  Service: Neurosurgery;  Laterality: N/A;  . BACK SURGERY    . CARDIAC CATHETERIZATION     X 1 stent before having CABG    . CORONARY ARTERY BYPASS GRAFT  06/28/2008   X4  . CRANIOTOMY Right 07/04/2019   Procedure: Right occipital craniotomy for tumor with brainlab;  Surgeon: Ashok Pall, MD;  Location: [facility];  Service: Neurosurgery;  Laterality: Right;  . ENDARTERECTOMY Right 07/03/2015   Procedure: RIGHT CAROTID ENDARTERECTOMY WITH BOVINE PERICARDIUM PATCH ANGIOPLASTY;  Surgeon: Serafina Mitchell, MD;  Location: [facility];  Service: Vascular;  Laterality: Right;  . LUMBAR LAMINECTOMY/DECOMPRESSION MICRODISCECTOMY N/A 09/06/2014   Procedure: LUMBER DECOMPRESSION L3-5;  Surgeon: Melina Schools, MD;  Location: [facility];  Service: Orthopedics;  Laterality: N/A;  . LUMBAR SPINE SURGERY  2002  . PROSTATE BIOPSY    . PROSTATECTOMY  02/2007  . VASECTOMY      There were no vitals filed for this visit.   Subjective Assessment - 02/13/20 1111    Subjective  Denies pain    Pertinent History [name] is a 74 y.o. 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 Mickeal [name], prostate cancer with radiation therapy prostatectomy, CAD with CABG maintained on Xarelto, chronic diastolic congestive heart failure.  Lives with spouse two-level home he had been receiving outpatient therapies.  Presented 10/08/2019 with increasing left-sided weakness headache nausea vomiting as well as bouts of hematuria.   CT of the head showed new severe vasogenic edema throughout the posterior right cerebral hemisphere with 8 mm leftNo evidence of acute intracranial abnormality.  Follow-up medical oncology maintained on Decadron therapy.    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 No/denies                Treatment: Activities in kitchen with pt ambulating to loacte items in  cabinets, min v.c for safety and close supervision as pt nearly bumped himself with cabinet doors on several occasions, then ambulating in busy gym to locate items, close supervision and min v.c to avoid bumping into items. Arm bike x 6 mins for conditioning.                  OT Short Term Goals - 02/06/20 1237      OT SHORT TERM GOAL #1   Title Pt to verbalize understanding of visual compensatory strategies to improve daily function including reading and environmental scanning    Time 4    Period Weeks    Status Achieved      OT SHORT TERM GOAL #2   Title Pt to perform tabletop scanning with 90% or greater accuracy using strategies    Baseline 85% for 1.5 M number cancellation    Time 4    Period Weeks    Status Not Met   53% accuracy 01/12/20     OT SHORT TERM GOAL #3   Title Pt will read 3/4 page of text with accuracy, visual aids/AE and extra time PRN    Time 4    Period Weeks    Status Achieved   met with larger print double spaced and line guide     OT SHORT TERM GOAL #4   Title Pt will perform dressing with supervision/ set up    Baseline min A for shoes/ socks    Time 4    Period Weeks    Status Achieved   pt supervision with increased time with socks.  supervision with shoes (needs help with orientation). 01/12/20     OT SHORT TERM GOAL #5   Title Pt will demonstrate improved LUE fine motor coordination for ADLs as evidenced by decreasing 9 hole peg test score to 52 secs or less.    Baseline RUE 33 secs, LUE 58.25    Time 4    Period Weeks    Status Achieved   01/12/20 - 49.31 seconds     OT SHORT TERM GOAL #6   Title Pt will demonstrate ability to retrieve a llightweight object at 130 shoulder flexion with LUE demonstrating good control.    Baseline shoulder flexion RUE 140, LUE 120    Time 4    Period Weeks    Status Achieved   11/21/19:  130*            OT Long Term Goals - 02/13/20 1112      OT LONG TERM GOAL #1   Title Pt will perform  environmental scanning at 90% accuracy in mod distracting environment    Time 12    Period Weeks    Status Deferred      OT LONG TERM GOAL #2   Title Pt to read full page of text or  greater with visual aids/AE prn    Time 12    Period Weeks    Status Not Met      OT LONG TERM GOAL #3   Title Pt will demonstrate improved fine motor coordination in LUE for ADLS as evidenced by decreasing 9 hole peg test score to 48 secs or less.    Baseline Pt will perform all basic ADLS with distant supervision.    Time 12    Period Weeks    Status Not Met   LUE 49.31 seconds - 01/12/20,  02/13/20- 64.41     OT LONG TERM GOAL #4   Title Pt will perform basic home management tasks with supervision demonstrating good safety awareness.    Time 12    Period Weeks    Status Deferred   pt is not performing any home management and not appropriate at this time. 01/12/20     OT LONG TERM GOAL #5   Title Pt will negotiate in kitchen with good safety precautions and utilize visual strategies to locate preferred item with supervision. (new goal at renewal)    Time 6    Period Weeks    Status Partially Met   needs supervision for safety to avoid bumping himeslf with cabinet door, min v.c                Plan - 02/13/20 1353    Clinical Impression Statement Pt made some overall progress, however he did not fully meet all goals due to complex medical diagnosis.    OT Occupational Profile and History Detailed Assessment- Review of Records and additional review of physical, cognitive, psychosocial history related to current functional performance    Occupational performance deficits (Please refer to evaluation for details): IADL's;Work;Leisure;Social Participation;ADL's    Body Structure / Function / Physical Skills IADL;Endurance;Balance;Vision;ADL;Mobility;Strength;UE functional use;FMC;Coordination;Gait;GMC;ROM;Decreased knowledge of use of DME;Dexterity    Cognitive Skills Memory;Problem  Solve;Perception;Thought;Safety Awareness    Rehab Potential Good    Comorbidities impacting occupational performance description: glioblastoma - pt has begun radiation    OT Frequency 1x / week   2x/week for 2 weeks and 1x/week for 4 weeks   OT Duration 6 weeks    OT Treatment/Interventions Self-care/ADL training;Functional Mobility Training;Therapeutic activities;Coping strategies training;Visual/perceptual remediation/compensation;Patient/family education;Cognitive remediation/compensation;Therapeutic exercise;Balance training;Manual Therapy;Neuromuscular education;Aquatic Therapy;Energy conservation;DME and/or AE instruction;Paraffin;Fluidtherapy;Gait Training;Passive range of motion;Moist Heat    Plan d/c OT    Consulted and Agree with Plan of Care Patient;Family member/caregiver    Family Member Consulted wife           Patient will benefit from skilled therapeutic intervention in order to improve the following deficits and impairments:   Body Structure / Function / Physical Skills: IADL, Endurance, Balance, Vision, ADL, Mobility, Strength, UE functional use, FMC, Coordination, Gait, GMC, ROM, Decreased knowledge of use of DME, Dexterity Cognitive Skills: Memory, Problem Solve, Perception, Thought, Safety Awareness     Visit Diagnosis: Muscle weakness (generalized)  Other abnormalities of gait and mobility  Visuospatial deficit  Other lack of coordination  Homonymous hemianopsia, left  Attention and concentration deficit  Frontal lobe and executive function deficit   OCCUPATIONAL THERAPY DISCHARGE SUMMARY    Current functional level related to goals / functional outcomes: See goals for progress, pt did not fully achieve goals due to severity of deficits.   Remaining deficits: Decreased balance, visual perceptual deficits/ visual field deficits, decreased coordination, cognitive deficits   Education / Equipment: Pt / wife were  educated in home activities and home  safety. Pt/ wife verbalize understanding of all education. Plan: Patient agrees to discharge.  Patient goals were partially met. Patient is being discharged due to lack of progress.  ?????     Problem List Patient Active Problem List   Diagnosis Date Noted  . 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 Dyslipidemia   . Seizure prophylaxis   . Chronic diastolic congestive heart failure ([place])   . Diabetic peripheral neuropathy ([facility])   . Palliative care by specialist   . Weakness 10/08/2019  . Vasogenic edema ([place]) 10/08/2019  . Gross hematuria 10/08/2019  . DNR (do not resuscitate) discussion 07/13/2019  . S/P craniotomy 07/04/2019  . Glioblastoma with isocitrate dehydrogenase gene wildtype ([facility] [facility]) 07/04/2019  . Respiratory failure ([facility])   . Encephalopathy acute   . Brain mass 07/01/2019  . Fever 07/01/2019  . Atherosclerosis of native artery of both lower extremities with intermittent claudication ([place]) 08/23/2018  . Coronary artery disease without angina pectoris 08/23/2018  . Dyspnea on exertion 08/23/2018  . Biochemically recurrent malignant neoplasm of prostate ([place]) 08/16/2018  . S/P carotid endarterectomy 08/20/2015  . Essential hypertension 08/20/2015  . Type 2 diabetes mellitus with circulatory disorder ([place]) 08/20/2015  . HLD (hyperlipidemia) 08/20/2015  . Carotid artery stenosis, symptomatic 07/03/2015  . Carotid stenosis, right   . TIA (transient ischemic attack) 06/18/2015  . ASCVD (arteriosclerotic cardiovascular disease) 06/18/2015  . Diabetes mellitus without complication ([place]) 88/41/6606  . Resistant hypertension 06/18/2015  . Hyperlipidemia 06/18/2015  . Spinal stenosis of lumbar region with neurogenic claudication 09/06/2014  . Atherosclerosis of native artery of extremity with intermittent claudication ([place]) 03/09/2013  . HYPERLIPIDEMIA 04/08/2009  . TOBACCO ABUSE 04/08/2009  . ATHEROSCLEROTIC CARDIOVASCULAR DISEASE 04/08/2009  . INTERMITTENT VERTIGO  04/08/2009  . ADENOCARCINOMA, PROSTATE 03/21/2009  . ABDOMINAL PAIN -GENERALIZED 03/21/2009  . History of coronary artery bypass graft 06/28/2008    [name] 02/13/2020, 1:54 PM Theone Murdoch, OTR/L [phone] Phone: [phone] 4:03 PM 02/13/20 [facility] [address] [place], [place], [address] Phone: [phone]   Fax:  [phone]  Name: [name] MRN: [id number] Date of Birth: [birth year]

## 2020-02-13 NOTE — Patient Instructions (Signed)
Access Code: [email or web address] URL: [email or web address] Date: 02/13/2020 Prepared by: Baldomero Lamy  Exercises Sit to Stand - 1 x daily - 5 x weekly - 1 sets - 5-10 reps Heel rises with counter support - 1 x daily - 5 x weekly - 1 sets - 10 reps Standing March with Counter Support - 1 x daily - 5 x weekly - 1 sets - 10 reps Mini Squat with Counter Support - 1 x daily - 5 x weekly - 1 sets - 5-10 reps Wide Stance with Counter Support - 1 x daily - 5 x weekly - 1 sets - 3 reps - 30 hold  Provided Tracker to help with compliance with HEP.

## 2020-02-29 ENCOUNTER — Other Ambulatory Visit: Payer: Self-pay

## 2020-02-29 ENCOUNTER — Ambulatory Visit (INDEPENDENT_AMBULATORY_CARE_PROVIDER_SITE_OTHER): Payer: Medicare Other | Admitting: Podiatry

## 2020-02-29 DIAGNOSIS — M79674 Pain in right toe(s): Secondary | ICD-10-CM | POA: Diagnosis not present

## 2020-02-29 DIAGNOSIS — E1159 Type 2 diabetes mellitus with other circulatory complications: Secondary | ICD-10-CM

## 2020-02-29 DIAGNOSIS — I70213 Atherosclerosis of native arteries of extremities with intermittent claudication, bilateral legs: Secondary | ICD-10-CM

## 2020-02-29 DIAGNOSIS — B351 Tinea unguium: Secondary | ICD-10-CM

## 2020-02-29 DIAGNOSIS — M79675 Pain in left toe(s): Secondary | ICD-10-CM

## 2020-03-01 DIAGNOSIS — K8020 Calculus of gallbladder without cholecystitis without obstruction: Secondary | ICD-10-CM | POA: Diagnosis not present

## 2020-03-01 DIAGNOSIS — N3289 Other specified disorders of bladder: Secondary | ICD-10-CM | POA: Diagnosis not present

## 2020-03-01 DIAGNOSIS — I700 Atherosclerosis of aorta: Secondary | ICD-10-CM | POA: Diagnosis not present

## 2020-03-01 DIAGNOSIS — R310 Gross hematuria: Secondary | ICD-10-CM | POA: Diagnosis not present

## 2020-03-03 ENCOUNTER — Encounter: Payer: Self-pay | Admitting: Podiatry

## 2020-03-03 NOTE — Progress Notes (Signed)
  Subjective:  Patient ID: [name], male    DOB: February 05, 1946,  MRN: [id number]  Chief Complaint  Patient presents with  . routine foot care    nail trim     74 y.o. male presents with the above complaint. History confirmed with patient.  No new issues  Objective:  Physical Exam: warm, good capillary refill, DP reduced bilateral, no trophic changes or ulcerative lesions, normal sensory exam, onychomycosis of all toenails and PT reduced bilateral.  Ingrowing lateral border of right second toenail without paronychia, resolving  Assessment:   No diagnosis found.   Plan:  Patient was evaluated and treated and all questions answered.   Patient educated on diabetes. Discussed proper diabetic foot care and discussed risks and complications of disease. Educated patient in depth on reasons to return to the office immediately should he/she discover anything concerning or new on the feet. All questions answered. Discussed proper shoes as well.   Discussed the etiology and treatment options for the condition in detail with the patient. Educated patient on the topical and oral treatment options for mycotic nails. Recommended debridement of the nails today. Sharp and mechanical debridement performed of all painful and mycotic nails today. Nails debrided in length and thickness using a nail nipper and a mechanical burr to level of comfort. Discussed treatment options including appropriate shoe gear. Follow up as needed for painful nails.     Return in about 3 months (around 05/31/2020).

## 2020-03-04 DIAGNOSIS — C719 Malignant neoplasm of brain, unspecified: Secondary | ICD-10-CM | POA: Diagnosis not present

## 2020-03-08 ENCOUNTER — Ambulatory Visit
Admission: RE | Admit: 2020-03-08 | Discharge: 2020-03-08 | Disposition: A | Discharge status: Home / Self Care | Payer: Medicare Other | Source: Ambulatory Visit | Attending: Internal Medicine | Admitting: Internal Medicine

## 2020-03-08 ENCOUNTER — Telehealth: Payer: Self-pay

## 2020-03-08 ENCOUNTER — Other Ambulatory Visit: Payer: Self-pay

## 2020-03-08 DIAGNOSIS — C719 Malignant neoplasm of brain, unspecified: Secondary | ICD-10-CM

## 2020-03-08 NOTE — Telephone Encounter (Signed)
Pt wife LM stating he was not able to complete his MRI today. He did not have the medication to keep him calm, though he did attempt the scan without it. She will reschedule it.  Pt wife wants to know the following:  1. Should pt still come to his appt Monday 11/22? 2. Can he have a rx for the scan?

## 2020-03-11 ENCOUNTER — Inpatient Hospital Stay: Payer: Medicare Other

## 2020-03-11 ENCOUNTER — Other Ambulatory Visit: Payer: Self-pay | Admitting: *Deleted

## 2020-03-11 ENCOUNTER — Telehealth: Payer: Self-pay | Admitting: *Deleted

## 2020-03-11 ENCOUNTER — Other Ambulatory Visit: Payer: Self-pay | Admitting: Internal Medicine

## 2020-03-11 ENCOUNTER — Inpatient Hospital Stay: Payer: Medicare Other | Admitting: Internal Medicine

## 2020-03-11 DIAGNOSIS — C719 Malignant neoplasm of brain, unspecified: Secondary | ICD-10-CM

## 2020-03-11 MED ORDER — LORAZEPAM 0.5 MG PO TABS: 0.5000 mg | ORAL_TABLET | Freq: Once | ORAL | 0 refills | Status: DC | PRN

## 2020-03-11 NOTE — Telephone Encounter (Signed)
Patient was unable to tolerate his last MRI since she didn't have his antianxiety medicine for this MRI.  Routed to MD to please order Ativan 0.5mg  for MRI's.

## 2020-03-13 ENCOUNTER — Other Ambulatory Visit: Payer: Self-pay

## 2020-03-13 ENCOUNTER — Ambulatory Visit (HOSPITAL_COMMUNITY)
Admission: RE | Admit: 2020-03-13 | Discharge: 2020-03-13 | Disposition: A | Discharge status: Home / Self Care | Payer: Medicare Other | Source: Ambulatory Visit | Attending: Internal Medicine | Admitting: Internal Medicine

## 2020-03-13 DIAGNOSIS — C714 Malignant neoplasm of occipital lobe: Secondary | ICD-10-CM | POA: Diagnosis not present

## 2020-03-13 DIAGNOSIS — Z20822 Contact with and (suspected) exposure to covid-19: Secondary | ICD-10-CM | POA: Diagnosis not present

## 2020-03-13 DIAGNOSIS — C719 Malignant neoplasm of brain, unspecified: Secondary | ICD-10-CM

## 2020-03-13 DIAGNOSIS — J90 Pleural effusion, not elsewhere classified: Secondary | ICD-10-CM | POA: Diagnosis not present

## 2020-03-13 DIAGNOSIS — E1111 Type 2 diabetes mellitus with ketoacidosis with coma: Secondary | ICD-10-CM | POA: Diagnosis not present

## 2020-03-13 DIAGNOSIS — F05 Delirium due to known physiological condition: Secondary | ICD-10-CM | POA: Diagnosis not present

## 2020-03-13 DIAGNOSIS — J9811 Atelectasis: Secondary | ICD-10-CM | POA: Diagnosis not present

## 2020-03-13 DIAGNOSIS — G936 Cerebral edema: Secondary | ICD-10-CM | POA: Diagnosis not present

## 2020-03-13 DIAGNOSIS — G40101 Localization-related (focal) (partial) symptomatic epilepsy and epileptic syndromes with simple partial seizures, not intractable, with status epilepticus: Secondary | ICD-10-CM | POA: Diagnosis not present

## 2020-03-13 DIAGNOSIS — I517 Cardiomegaly: Secondary | ICD-10-CM | POA: Diagnosis not present

## 2020-03-13 DIAGNOSIS — R569 Unspecified convulsions: Secondary | ICD-10-CM | POA: Diagnosis not present

## 2020-03-13 DIAGNOSIS — R000 Tachycardia, unspecified: Secondary | ICD-10-CM | POA: Diagnosis not present

## 2020-03-13 DIAGNOSIS — G40901 Epilepsy, unspecified, not intractable, with status epilepticus: Secondary | ICD-10-CM | POA: Diagnosis not present

## 2020-03-13 MED ORDER — GADOBUTROL 1 MMOL/ML IV SOLN
9.0000 mL | Freq: Once | INTRAVENOUS | Status: AC | PRN
  Administered 2020-03-13: 9 mL via INTRAVENOUS

## 2020-03-16 ENCOUNTER — Emergency Department (HOSPITAL_COMMUNITY): Payer: Medicare Other

## 2020-03-16 ENCOUNTER — Inpatient Hospital Stay (HOSPITAL_COMMUNITY)
Admission: EM | Admit: 2020-03-16 | Discharge: 2020-03-21 | DRG: 100 | Disposition: A | Discharge status: Home / Self Care | Payer: Medicare Other | Attending: Internal Medicine | Admitting: Internal Medicine

## 2020-03-16 DIAGNOSIS — R000 Tachycardia, unspecified: Secondary | ICD-10-CM | POA: Diagnosis not present

## 2020-03-16 DIAGNOSIS — I2510 Atherosclerotic heart disease of native coronary artery without angina pectoris: Secondary | ICD-10-CM | POA: Diagnosis present

## 2020-03-16 DIAGNOSIS — Z7952 Long term (current) use of systemic steroids: Secondary | ICD-10-CM

## 2020-03-16 DIAGNOSIS — Z955 Presence of coronary angioplasty implant and graft: Secondary | ICD-10-CM

## 2020-03-16 DIAGNOSIS — E1111 Type 2 diabetes mellitus with ketoacidosis with coma: Secondary | ICD-10-CM | POA: Diagnosis present

## 2020-03-16 DIAGNOSIS — J9811 Atelectasis: Secondary | ICD-10-CM | POA: Diagnosis not present

## 2020-03-16 DIAGNOSIS — E785 Hyperlipidemia, unspecified: Secondary | ICD-10-CM | POA: Diagnosis present

## 2020-03-16 DIAGNOSIS — H5500 Unspecified nystagmus: Secondary | ICD-10-CM | POA: Diagnosis present

## 2020-03-16 DIAGNOSIS — Z87891 Personal history of nicotine dependence: Secondary | ICD-10-CM | POA: Diagnosis not present

## 2020-03-16 DIAGNOSIS — Z823 Family history of stroke: Secondary | ICD-10-CM | POA: Diagnosis not present

## 2020-03-16 DIAGNOSIS — C719 Malignant neoplasm of brain, unspecified: Secondary | ICD-10-CM | POA: Diagnosis not present

## 2020-03-16 DIAGNOSIS — Z781 Physical restraint status: Secondary | ICD-10-CM | POA: Diagnosis not present

## 2020-03-16 DIAGNOSIS — I6789 Other cerebrovascular disease: Secondary | ICD-10-CM | POA: Diagnosis not present

## 2020-03-16 DIAGNOSIS — G40901 Epilepsy, unspecified, not intractable, with status epilepticus: Secondary | ICD-10-CM

## 2020-03-16 DIAGNOSIS — F05 Delirium due to known physiological condition: Secondary | ICD-10-CM | POA: Diagnosis not present

## 2020-03-16 DIAGNOSIS — G936 Cerebral edema: Secondary | ICD-10-CM | POA: Diagnosis present

## 2020-03-16 DIAGNOSIS — E119 Type 2 diabetes mellitus without complications: Secondary | ICD-10-CM

## 2020-03-16 DIAGNOSIS — E1140 Type 2 diabetes mellitus with diabetic neuropathy, unspecified: Secondary | ICD-10-CM | POA: Diagnosis present

## 2020-03-16 DIAGNOSIS — C714 Malignant neoplasm of occipital lobe: Secondary | ICD-10-CM | POA: Diagnosis present

## 2020-03-16 DIAGNOSIS — G40101 Localization-related (focal) (partial) symptomatic epilepsy and epileptic syndromes with simple partial seizures, not intractable, with status epilepticus: Principal | ICD-10-CM | POA: Diagnosis present

## 2020-03-16 DIAGNOSIS — I5032 Chronic diastolic (congestive) heart failure: Secondary | ICD-10-CM | POA: Diagnosis present

## 2020-03-16 DIAGNOSIS — I69354 Hemiplegia and hemiparesis following cerebral infarction affecting left non-dominant side: Secondary | ICD-10-CM

## 2020-03-16 DIAGNOSIS — Z951 Presence of aortocoronary bypass graft: Secondary | ICD-10-CM

## 2020-03-16 DIAGNOSIS — E1110 Type 2 diabetes mellitus with ketoacidosis without coma: Secondary | ICD-10-CM | POA: Diagnosis not present

## 2020-03-16 DIAGNOSIS — J90 Pleural effusion, not elsewhere classified: Secondary | ICD-10-CM | POA: Diagnosis not present

## 2020-03-16 DIAGNOSIS — I4891 Unspecified atrial fibrillation: Secondary | ICD-10-CM | POA: Diagnosis present

## 2020-03-16 DIAGNOSIS — Z841 Family history of disorders of kidney and ureter: Secondary | ICD-10-CM | POA: Diagnosis not present

## 2020-03-16 DIAGNOSIS — I110 Hypertensive heart disease with heart failure: Secondary | ICD-10-CM | POA: Diagnosis present

## 2020-03-16 DIAGNOSIS — F419 Anxiety disorder, unspecified: Secondary | ICD-10-CM | POA: Diagnosis present

## 2020-03-16 DIAGNOSIS — Z79899 Other long term (current) drug therapy: Secondary | ICD-10-CM

## 2020-03-16 DIAGNOSIS — Z20822 Contact with and (suspected) exposure to covid-19: Secondary | ICD-10-CM | POA: Diagnosis present

## 2020-03-16 DIAGNOSIS — R410 Disorientation, unspecified: Secondary | ICD-10-CM | POA: Diagnosis not present

## 2020-03-16 DIAGNOSIS — R404 Transient alteration of awareness: Secondary | ICD-10-CM | POA: Diagnosis not present

## 2020-03-16 DIAGNOSIS — Z8546 Personal history of malignant neoplasm of prostate: Secondary | ICD-10-CM | POA: Diagnosis not present

## 2020-03-16 DIAGNOSIS — I517 Cardiomegaly: Secondary | ICD-10-CM | POA: Diagnosis not present

## 2020-03-16 DIAGNOSIS — R569 Unspecified convulsions: Secondary | ICD-10-CM | POA: Diagnosis not present

## 2020-03-16 DIAGNOSIS — I252 Old myocardial infarction: Secondary | ICD-10-CM | POA: Diagnosis not present

## 2020-03-16 DIAGNOSIS — R0902 Hypoxemia: Secondary | ICD-10-CM | POA: Diagnosis not present

## 2020-03-16 LAB — DIFFERENTIAL
Abs Immature Granulocytes: 0.14 10*3/uL — ABNORMAL HIGH (ref 0.00–0.07)
Basophils Absolute: 0 10*3/uL (ref 0.0–0.1)
Basophils Relative: 0 %
Eosinophils Absolute: 0 10*3/uL (ref 0.0–0.5)
Eosinophils Relative: 0 %
Immature Granulocytes: 2 %
Lymphocytes Relative: 11 %
Lymphs Abs: 0.8 10*3/uL (ref 0.7–4.0)
Monocytes Absolute: 0.6 10*3/uL (ref 0.1–1.0)
Monocytes Relative: 8 %
Neutro Abs: 5.9 10*3/uL (ref 1.7–7.7)
Neutrophils Relative %: 79 %

## 2020-03-16 LAB — I-STAT ARTERIAL BLOOD GAS, ED
Acid-base deficit: 3 mmol/L — ABNORMAL HIGH (ref 0.0–2.0)
Bicarbonate: 25.4 mmol/L (ref 20.0–28.0)
Calcium, Ion: 1.18 mmol/L (ref 1.15–1.40)
HCT: 43 % (ref 39.0–52.0)
Hemoglobin: 14.6 g/dL (ref 13.0–17.0)
O2 Saturation: 99 %
Potassium: 4.8 mmol/L (ref 3.5–5.1)
Sodium: 132 mmol/L — ABNORMAL LOW (ref 135–145)
TCO2: 27 mmol/L (ref 22–32)
pCO2 arterial: 59.9 mmHg — ABNORMAL HIGH (ref 32.0–48.0)
pH, Arterial: 7.236 — ABNORMAL LOW (ref 7.350–7.450)
pO2, Arterial: 176 mmHg — ABNORMAL HIGH (ref 83.0–108.0)

## 2020-03-16 LAB — I-STAT CHEM 8, ED
BUN: 32 mg/dL — ABNORMAL HIGH (ref 8–23)
Calcium, Ion: 1.05 mmol/L — ABNORMAL LOW (ref 1.15–1.40)
Chloride: 100 mmol/L (ref 98–111)
Creatinine, Ser: 0.9 mg/dL (ref 0.61–1.24)
Glucose, Bld: 514 mg/dL (ref 70–99)
HCT: 45 % (ref 39.0–52.0)
Hemoglobin: 15.3 g/dL (ref 13.0–17.0)
Potassium: 4.8 mmol/L (ref 3.5–5.1)
Sodium: 131 mmol/L — ABNORMAL LOW (ref 135–145)
TCO2: 21 mmol/L — ABNORMAL LOW (ref 22–32)

## 2020-03-16 LAB — COMPREHENSIVE METABOLIC PANEL
ALT: 30 U/L (ref 0–44)
AST: 25 U/L (ref 15–41)
Albumin: 3.8 g/dL (ref 3.5–5.0)
Alkaline Phosphatase: 70 U/L (ref 38–126)
Anion gap: 18 — ABNORMAL HIGH (ref 5–15)
BUN: 28 mg/dL — ABNORMAL HIGH (ref 8–23)
CO2: 20 mmol/L — ABNORMAL LOW (ref 22–32)
Calcium: 9 mg/dL (ref 8.9–10.3)
Chloride: 96 mmol/L — ABNORMAL LOW (ref 98–111)
Creatinine, Ser: 1.22 mg/dL (ref 0.61–1.24)
GFR, Estimated: >60 mL/min (ref >60)
Glucose, Bld: 495 mg/dL — ABNORMAL HIGH (ref 70–99)
Potassium: 4.8 mmol/L (ref 3.5–5.1)
Sodium: 134 mmol/L — ABNORMAL LOW (ref 135–145)
Total Bilirubin: 1.5 mg/dL — ABNORMAL HIGH (ref 0.3–1.2)
Total Protein: 6.6 g/dL (ref 6.5–8.1)

## 2020-03-16 LAB — APTT: aPTT: 21 seconds — ABNORMAL LOW (ref 24–36)

## 2020-03-16 LAB — CBC
HCT: 45.8 % (ref 39.0–52.0)
Hemoglobin: 15.2 g/dL (ref 13.0–17.0)
MCH: 30.5 pg (ref 26.0–34.0)
MCHC: 33.2 g/dL (ref 30.0–36.0)
MCV: 91.8 fL (ref 80.0–100.0)
Platelets: 137 10*3/uL — ABNORMAL LOW (ref 150–400)
RBC: 4.99 MIL/uL (ref 4.22–5.81)
RDW: 13.7 % (ref 11.5–15.5)
WBC: 7.5 10*3/uL (ref 4.0–10.5)
nRBC: 0.4 % — ABNORMAL HIGH (ref 0.0–0.2)

## 2020-03-16 LAB — MAGNESIUM: Magnesium: 1.9 mg/dL (ref 1.7–2.4)

## 2020-03-16 LAB — PROTIME-INR
INR: 0.9 (ref 0.8–1.2)
Prothrombin Time: 12 seconds (ref 11.4–15.2)

## 2020-03-16 LAB — RESP PANEL BY RT-PCR (FLU A&B, COVID) ARPGX2
Influenza A by PCR: NEGATIVE
Influenza B by PCR: NEGATIVE
SARS Coronavirus 2 by RT PCR: NEGATIVE

## 2020-03-16 LAB — CBG MONITORING, ED
Glucose-Capillary: 322 mg/dL — ABNORMAL HIGH (ref 70–99)
Glucose-Capillary: 407 mg/dL — ABNORMAL HIGH (ref 70–99)
Glucose-Capillary: 445 mg/dL — ABNORMAL HIGH (ref 70–99)

## 2020-03-16 LAB — BETA-HYDROXYBUTYRIC ACID: Beta-Hydroxybutyric Acid: 0.22 mmol/L (ref 0.05–0.27)

## 2020-03-16 LAB — LACTIC ACID, PLASMA: Lactic Acid, Venous: 7.6 mmol/L (ref 0.5–1.9)

## 2020-03-16 LAB — PHOSPHORUS: Phosphorus: 5.2 mg/dL — ABNORMAL HIGH (ref 2.5–4.6)

## 2020-03-16 MED ORDER — LEVETIRACETAM IN NACL 1000 MG/100ML IV SOLN
1000.0000 mg | Freq: Once | INTRAVENOUS | Status: AC
  Administered 2020-03-16: 1000 mg via INTRAVENOUS

## 2020-03-16 MED ORDER — HEPARIN SODIUM (PORCINE) 5000 UNIT/ML IJ SOLN
5000.0000 [IU] | Freq: Three times a day (TID) | INTRAMUSCULAR | Status: DC
  Administered 2020-03-17 – 2020-03-21 (×14): 5000 [IU] via SUBCUTANEOUS
  Filled 2020-03-16 (×12): qty 1

## 2020-03-16 MED ORDER — POTASSIUM CHLORIDE 10 MEQ/100ML IV SOLN: 10.0000 meq | INTRAVENOUS | Status: DC

## 2020-03-16 MED ORDER — LACTATED RINGERS IV BOLUS
500.0000 mL | Freq: Once | INTRAVENOUS | Status: AC
  Administered 2020-03-16: 500 mL via INTRAVENOUS

## 2020-03-16 MED ORDER — SODIUM CHLORIDE 0.9% FLUSH
3.0000 mL | Freq: Once | INTRAVENOUS | Status: AC
Start: 2020-03-16 — End: 2020-03-16
  Administered 2020-03-16: 3 mL via INTRAVENOUS

## 2020-03-16 MED ORDER — DEXTROSE 50 % IV SOLN: 0.0000 mL | INTRAVENOUS | Status: DC | PRN

## 2020-03-16 MED ORDER — DEXTROSE IN LACTATED RINGERS 5 % IV SOLN: INTRAVENOUS | Status: DC

## 2020-03-16 MED ORDER — LORAZEPAM 2 MG/ML IJ SOLN
INTRAMUSCULAR | Status: AC
  Filled 2020-03-16: qty 1

## 2020-03-16 MED ORDER — LORAZEPAM 2 MG/ML IJ SOLN
2.0000 mg | Freq: Once | INTRAMUSCULAR | Status: DC | PRN
  Filled 2020-03-16: qty 1

## 2020-03-16 MED ORDER — LEVETIRACETAM IN NACL 1500 MG/100ML IV SOLN
1500.0000 mg | Freq: Two times a day (BID) | INTRAVENOUS | Status: DC
  Administered 2020-03-16 – 2020-03-21 (×10): 1500 mg via INTRAVENOUS
  Filled 2020-03-16 (×11): qty 100

## 2020-03-16 MED ORDER — LACTATED RINGERS IV SOLN: INTRAVENOUS | Status: DC

## 2020-03-16 MED ORDER — INSULIN REGULAR(HUMAN) IN NACL 100-0.9 UT/100ML-% IV SOLN
INTRAVENOUS | Status: DC
  Administered 2020-03-16: 8.5 [IU]/h via INTRAVENOUS
  Filled 2020-03-16: qty 100

## 2020-03-16 MED ORDER — DEXAMETHASONE SODIUM PHOSPHATE 4 MG/ML IJ SOLN
4.0000 mg | Freq: Two times a day (BID) | INTRAMUSCULAR | Status: DC
  Administered 2020-03-17 – 2020-03-21 (×9): 4 mg via INTRAVENOUS
  Filled 2020-03-16 (×9): qty 1

## 2020-03-16 NOTE — ED Triage Notes (Signed)
Patient presents from home via EMS. Called out for unresponsive. Upon their arrival, patient was seizing. 2.5 Versed given. EMS reports pt was postictal, then had GCS 13. GCS then went down to 11, ventilations assisted via BVM. Upon patient arrival, Lt sided tremors and nystagmus noted. Providers at bedside. Hx of brain cancer.

## 2020-03-16 NOTE — H&P (Signed)
NAME:  [name], MRN:  [id number], DOB:  [birth year], LOS: 0 ADMISSION DATE:  03/16/2020, CONSULTATION DATE: 03/16/2020 REFERRING MD: Dr. Leeanne Mannan, ED, CHIEF COMPLAINT: Seizure  Brief History   Patient is a 74 year old male brought to the emergency room in status with history of glioblastoma  History of present illness   Patient is a 74 year old male brought to the emergency room in status.  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  CT scan performed this evening shows no evidence of significant worsening in tumor burden, no evidence of bleeding or worsening edema.  Patient received 4 mg of Ativan and a 2 g Keppra loading dose with another 2 mg of IV Ativan in the emergency room.  He was not intubated.  He is protecting his airway on my evaluation, has a nasal trumpet in place.  He is on 2 L nasal cannula with O2 saturation 92%, pH on blood gas done about 3 hours ago shows a pH of 7.24.  Patient is somnolent but spontaneously moving on the right.  He has chronic left-sided weakness.  His wife relates that history of previous questionable seizure about 3 months ago and at that time he was started on a gram of oral Keppra twice a day.  He also has a history of venous sinus thrombosis postoperatively and was placed on Xarelto.  That was stopped several weeks ago due to hematuria.   Past Medical History   . Anxiety   . Arthritis   . ASCVD (arteriosclerotic cardiovascular disease)    03/2004: DES x2 to the RCA; IMI with stent occlusion in 02/2005- suboptimal Plavix compliance  . CAD (coronary artery disease)   . Carcinoma of prostate ([place])    Clopidogrel held for biopsy  . Constipation due to pain medication   . Diabetes mellitus without complication ([place])   . Diabetic neuropathy ([place])   . Headache 2021  .  Hyperlipidemia   . Hypertension   . Mild depression ([place])   . Morbid obesity ([place])   . Myocardial infarction ([place])   . Shortness of breath dyspnea   . Sleep apnea    no longer uses cpap  . Stroke ([place]) 2017  . Tobacco abuse    stopped smoking 06/28/09     Significant Hospital Events   Presents to the emergency room in status 03/16/2020  Consults:  Neurology  Procedures:  NA  Significant Diagnostic Tests:  CT scan of the brain (unchanged)  Micro Data:  NA  Antimicrobials:  NA  Interim history/subjective:  NA  Objective   Blood pressure (!) 144/88, pulse (!) 117, temperature (!) 97.5 F (36.4 C), temperature source Skin, resp. rate (!) 22, SpO2 91 %.       No intake or output data in the 24 hours ending 03/16/20 2344 There were no vitals filed for this visit.  Examination: General: Elderly male somnolent no acute respiratory distress HENT: Within normal limits Lungs: Occasional coarse Cardiovascular: Regular rate and rhythm Abdomen: Benign bowel sounds positive Extremities: Left upper and lower extremity weakness Neuro: Somnolent with chronic left sided weakness GU: Within normal limits  Resolved Hospital Problem list   NA  Assessment & Plan:  1.  Status epilepticus: Treated as per HPI.  Now on Keppra 1.5 mg IV twice daily.  We will continue home Decadron 4 mg IV twice  daily.  Neuro following.  2.  History of glioblastoma: As above  3.  History of type 2 diabetes, on home insulin: Current blood sugars are running between 4 and 500.  Doubt DKA feel anion gap most likely secondary to lactic acidosis.  Beta hydroxybutyrate is pending.  We will continue DKA Endo tool as ordered till glucose is under control.  4.  Lactic acidosis secondary to seizure: Monitor continue IV hydration  5.  History of CAD  6.  Significant clinical somnolence secondary to antiepileptic medications and postictal state: Will try to get him through this without intubation  however should he have a repeat seizure requiring more Ativan or worsening somnolence failure to protect his airway he will require intubation.  This was discussed at the bedside with his wife.  We will repeat arterial blood gas once in neuro ICU  Best practice (evaluated daily)   Diet: N.p.o. until more awake Pain/Anxiety/Delirium protocol (if indicated): N/A VAP protocol (if indicated): N/A DVT prophylaxis: N/A GI prophylaxis: N/A Glucose control: As above Mobility: Bedrest Code Status: Full Disposition: To neuro ICU for close observation  Labs   CBC: Recent Labs  Lab 03/16/20 2103 03/16/20 2107 03/16/20 2144  WBC 7.5  --   --   NEUTROABS 5.9  --   --   HGB 15.2 15.3 14.6  HCT 45.8 45.0 43.0  MCV 91.8  --   --   PLT 137*  --   --     Basic Metabolic Panel: Recent Labs  Lab 03/16/20 2103 03/16/20 2107 03/16/20 2144  NA 134* 131* 132*  K 4.8 4.8 4.8  CL 96* 100  --   CO2 20*  --   --   GLUCOSE 495* 514*  --   BUN 28* 32*  --   CREATININE 1.22 0.90  --   CALCIUM 9.0  --   --   MG 1.9  --   --   PHOS 5.2*  --   --    GFR: CrCl cannot be calculated (Unknown ideal weight.). Recent Labs  Lab 03/16/20 2103 03/16/20 2104  WBC 7.5  --   LATICACIDVEN  --  7.6*    Liver Function Tests: Recent Labs  Lab 03/16/20 2103  AST 25  ALT 30  ALKPHOS 70  BILITOT 1.5*  PROT 6.6  ALBUMIN 3.8   No results for input(s): LIPASE, AMYLASE in the last 168 hours. No results for input(s): AMMONIA in the last 168 hours.  ABG    Component Value Date/Time   PHART 7.236 (L) 03/16/2020 2144   PCO2ART 59.9 (H) 03/16/2020 2144   PO2ART 176 (H) 03/16/2020 2144   HCO3 25.4 03/16/2020 2144   TCO2 27 03/16/2020 2144   ACIDBASEDEF 3.0 (H) 03/16/2020 2144   O2SAT 99.0 03/16/2020 2144     Coagulation Profile: Recent Labs  Lab 03/16/20 2103  INR 0.9    Cardiac Enzymes: No results for input(s): CKTOTAL, CKMB, CKMBINDEX, TROPONINI in the last 168 hours.  HbA1C: Hgb A1c  MFr Bld  Date/Time Value Ref Range Status  10/08/2019 07:19 PM 6.9 (H) 4.8 - 5.6 % Final    Comment:    (NOTE)         Prediabetes: 5.7 - 6.4         Diabetes: >6.4         Glycemic control for adults with diabetes: <7.0   07/04/2019 10:58 AM 6.0 (H) 4.8 - 5.6 % Final    Comment:    (  NOTE) Pre diabetes:          5.7%-6.4% Diabetes:              >6.4% Glycemic control for   <7.0% adults with diabetes     CBG: Recent Labs  Lab 03/16/20 2109 03/16/20 2207 03/16/20 2337  GLUCAP 445* 407* 322*    Review of Systems:   Prior to this evening the patient had no significant change in clinical status.  This is according to his wife.  Unable to get full review of systems from patient due to severe somnolence.  Past Medical History  He,  has a past medical history of Anxiety, Arthritis, ASCVD (arteriosclerotic cardiovascular disease), CAD (coronary artery disease), Carcinoma of prostate ([place]), Constipation due to pain medication, Diabetes mellitus without complication ([place]), Diabetic neuropathy ([place]), Headache (2021), Hyperlipidemia, Hypertension, Mild depression ([place]), Morbid obesity ([place]), Myocardial infarction [facility]), Shortness of breath dyspnea, Sleep apnea, Stroke ([place]) (2017), and Tobacco abuse.   Surgical History    Past Surgical History:  Procedure Laterality Date  . APPLICATION OF CRANIAL NAVIGATION N/A 07/04/2019   Procedure: APPLICATION OF CRANIAL NAVIGATION;  Surgeon: Ashok Pall, MD;  Location: [facility];  Service: Neurosurgery;  Laterality: N/A;  . BACK SURGERY    . CARDIAC CATHETERIZATION     X 1 stent before having CABG  . CORONARY ARTERY BYPASS GRAFT  06/28/2008   X4  . CRANIOTOMY Right 07/04/2019   Procedure: Right occipital craniotomy for tumor with brainlab;  Surgeon: Ashok Pall, MD;  Location: [facility];  Service: Neurosurgery;  Laterality: Right;  . ENDARTERECTOMY Right 07/03/2015   Procedure: RIGHT CAROTID ENDARTERECTOMY WITH BOVINE PERICARDIUM PATCH ANGIOPLASTY;   Surgeon: Serafina Mitchell, MD;  Location: [facility];  Service: Vascular;  Laterality: Right;  . LUMBAR LAMINECTOMY/DECOMPRESSION MICRODISCECTOMY N/A 09/06/2014   Procedure: LUMBER DECOMPRESSION L3-5;  Surgeon: Melina Schools, MD;  Location: [facility];  Service: Orthopedics;  Laterality: N/A;  . LUMBAR SPINE SURGERY  2002  . PROSTATE BIOPSY    . PROSTATECTOMY  02/2007  . VASECTOMY       Social History   reports that he quit smoking about 11 years ago. His smoking use included cigarettes. He has a 40.00 pack-year smoking history. He quit smokeless tobacco use about 10 years ago. He reports current alcohol use of about 3.0 standard drinks of alcohol per week. He reports that he does not use drugs.   Family History   His family history includes Breast cancer in his sister; Kidney disease in his mother; Stroke in his father. There is no history of Colon cancer, Prostate cancer, or Pancreatic cancer.   Allergies Allergies  Allergen Reactions  . Lisinopril Cough     Home Medications  Prior to Admission medications   Medication Sig Start Date End Date Taking? Authorizing Provider  acetaminophen (TYLENOL) 325 MG tablet Take 2 tablets (650 mg total) by mouth every 6 (six) hours as needed for mild pain (or Fever >/= 101). 10/11/19   Allie Bossier, MD  amLODipine (NORVASC) 10 MG tablet Take 1 tablet (10 mg total) by mouth every evening. 10/17/19   Angiulli, Lavon Paganini, PA-C  atorvastatin (LIPITOR) 80 MG tablet Take 1 tablet (80 mg total) by mouth at bedtime. 10/17/19   Angiulli, Lavon Paganini, PA-C  calcium citrate (CALCITRATE - DOSED IN MG ELEMENTAL CALCIUM) 950 (200 Ca) MG tablet Take 1 tablet (200 mg of elemental calcium total) by mouth daily. 10/18/19   Angiulli, Lavon Paganini, PA-C  dexamethasone (DECADRON) 2  MG tablet Take 2 tablets (4 mg total) by mouth 2 (two) times daily. 02/05/20   Ventura Sellers, MD  levETIRAcetam (KEPPRA) 1000 MG tablet Take 1 tablet (1,000 mg total) by mouth 2 (two) times daily. 02/05/20    Vaslow, Acey Lav, MD  LORazepam (ATIVAN) 0.5 MG tablet Take 1 tablet (0.5 mg total) by mouth once as needed for up to 1 dose (MRI sedation). 03/11/20   Ventura Sellers, MD  losartan-hydrochlorothiazide (HYZAAR) 100-12.5 MG tablet Take 1 tablet by mouth daily. 11/18/19   [provider]  metoprolol succinate (TOPROL-XL) 25 MG 24 hr tablet Take 0.5 tablets (12.5 mg total) by mouth every evening. 10/17/19   Angiulli, Lavon Paganini, PA-C  ondansetron (ZOFRAN) 8 MG tablet Take 1 tablet (8 mg total) by mouth 2 (two) times daily as needed (nausea and vomiting). May take 30-60 minutes prior to Temodar administration if nausea/vomiting occurs. 10/30/19   Vaslow, Acey Lav, MD  polyethylene glycol (MIRALAX / GLYCOLAX) 17 g packet Take 17 g by mouth daily as needed (constipation). 10/11/19   Allie Bossier, MD  rivaroxaban (XARELTO) 20 MG TABS tablet Take 1 tablet (20 mg total) by mouth daily with supper. Patient not taking: Reported on 11/30/2019 10/17/19   Angiulli, Lavon Paganini, PA-C  temozolomide (TEMODAR) 140 MG capsule Take 1 capsule (140 mg total) by mouth daily. May take on an empty stomach to decrease nausea & vomiting. Patient not taking: Reported on 11/30/2019 10/30/19   Ventura Sellers, MD  temozolomide (TEMODAR) 140 MG capsule Take 3 capsules (420 mg total) by mouth daily. May take on an empty stomach to decrease nausea & vomiting. 11/30/19   Ventura Sellers, MD  temozolomide (TEMODAR) 140 MG capsule Take 1 capsule (140 mg total) by mouth daily. May take on an empty stomach to decrease nausea & vomiting. 01/02/20   Ventura Sellers, MD  temozolomide (TEMODAR) 140 MG capsule Take 1 capsule (140 mg total) by mouth daily. May take on an empty stomach to decrease nausea & vomiting. 02/05/20   Ventura Sellers, MD  temozolomide (TEMODAR) 180 MG capsule Take 1 capsule (180 mg total) by mouth daily. May take on an empty stomach to decrease nausea & vomiting. Patient not taking: Reported on 11/30/2019 10/30/19    Ventura Sellers, MD  temozolomide (TEMODAR) 180 MG capsule Take 1 capsule (180 mg total) by mouth daily. May take on an empty stomach to decrease nausea & vomiting. 01/02/20   Ventura Sellers, MD  temozolomide (TEMODAR) 180 MG capsule Take 1 capsule (180 mg total) by mouth daily. May take on an empty stomach to decrease nausea & vomiting. 02/05/20   Ventura Sellers, MD     Critical care time: Over 35 minutes was spent bedside evaluation chart review and critical care planning

## 2020-03-16 NOTE — ED Notes (Signed)
Code stroke canceled @ 2110

## 2020-03-16 NOTE — ED Notes (Addendum)
Called lab, they will add beta-hydroxybutyric acid to current specimens in lab.

## 2020-03-16 NOTE — ED Notes (Signed)
Patient was being evaluated for need for possible intubation/having recurrent seizure activity, delay to CT r/t same.

## 2020-03-16 NOTE — ED Provider Notes (Signed)
[facility] EMERGENCY DEPARTMENT Provider Note   CSN: [id number] Arrival date & time: 03/16/20  2101  An emergency department physician performed an initial assessment on this suspected stroke patient at 2119.  History Chief Complaint  Patient presents with  . Code Stroke  . Seizures  . Neurologic Problem    [name] is a 74 y.o. male.  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  He has known history of seizures and takes Keppra.  Patient arrived via EMS with active seizure.  EMS reports on arrival patient was postictal and not actively seizing.  He was confused but stable airway.  They report on route he declined and began having convulsive activity.  Patient was given 2.5 mg of Versed and due to appearance of unilateral weakness with a leftward gaze code stroke was activated.  Family had reported hearing a thump and patient was found to be having a seizure.  EMS was immediately called.    Past Medical History:  Diagnosis Date  . Anxiety   . Arthritis   . ASCVD (arteriosclerotic cardiovascular disease)    03/2004: DES x2 to the RCA; IMI with stent occlusion in 02/2005- suboptimal Plavix compliance  . CAD (coronary artery disease)   . Carcinoma of prostate ([place])    Clopidogrel held for biopsy  . Constipation due to pain medication   . Diabetes mellitus without complication ([place])   . Diabetic neuropathy ([place])   . Headache 2021  . Hyperlipidemia   . Hypertension   . Mild depression ([place])   . Morbid obesity ([place])   . Myocardial infarction ([place])   . Shortness of breath dyspnea   . Sleep apnea    no longer uses cpap  . Stroke ([place]) 2017  . Tobacco abuse    stopped smoking 06/28/09    Patient Active Problem List   Diagnosis Date Noted  . Seizure ([place]) 03/16/2020  . 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 Dyslipidemia   . Seizure prophylaxis   . Chronic diastolic congestive heart failure ([place])   .  Diabetic peripheral neuropathy ([place])   . Palliative care by specialist   . Weakness 10/08/2019  . Vasogenic edema ([place]) 10/08/2019  . Gross hematuria 10/08/2019  . DNR (do not resuscitate) discussion 07/13/2019  . S/P craniotomy 07/04/2019  . 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 Respiratory failure ([place])   . Encephalopathy acute   . Brain mass 07/01/2019  . Fever 07/01/2019  . Atherosclerosis of native artery of both lower extremities with intermittent claudication ([place]) 08/23/2018  . Coronary artery disease without angina pectoris 08/23/2018  . Dyspnea on exertion 08/23/2018  . 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 S/P carotid endarterectomy 08/20/2015  . Essential hypertension 08/20/2015  . 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 HLD (hyperlipidemia) 08/20/2015  . Carotid artery stenosis, symptomatic 07/03/2015  . Carotid stenosis, right   . TIA (transient ischemic attack) 06/18/2015  . ASCVD (arteriosclerotic cardiovascular disease) 06/18/2015  . Diabetes mellitus without complication ([place]) 91/63/8466  . Resistant hypertension 06/18/2015  . Hyperlipidemia 06/18/2015  . Spinal stenosis of lumbar region with neurogenic claudication 09/06/2014  . Atherosclerosis of native artery of extremity with intermittent claudication ([place]) 03/09/2013  . HYPERLIPIDEMIA 04/08/2009  . TOBACCO ABUSE 04/08/2009  . ATHEROSCLEROTIC CARDIOVASCULAR DISEASE 04/08/2009  . INTERMITTENT VERTIGO 04/08/2009  . ADENOCARCINOMA, PROSTATE 03/21/2009  . ABDOMINAL PAIN -GENERALIZED 03/21/2009  . History of  coronary artery bypass graft 06/28/2008    Past Surgical History:  Procedure Laterality Date  . APPLICATION OF CRANIAL NAVIGATION N/A 07/04/2019   Procedure: APPLICATION OF CRANIAL NAVIGATION;  Surgeon: Ashok Pall, MD;  Location: [facility];  Service: Neurosurgery;  Laterality: N/A;  . BACK SURGERY      . CARDIAC CATHETERIZATION     X 1 stent before having CABG  . CORONARY ARTERY BYPASS GRAFT  06/28/2008   X4  . CRANIOTOMY Right 07/04/2019   Procedure: Right occipital craniotomy for tumor with brainlab;  Surgeon: Ashok Pall, MD;  Location: [facility];  Service: Neurosurgery;  Laterality: Right;  . ENDARTERECTOMY Right 07/03/2015   Procedure: RIGHT CAROTID ENDARTERECTOMY WITH BOVINE PERICARDIUM PATCH ANGIOPLASTY;  Surgeon: Serafina Mitchell, MD;  Location: [facility];  Service: Vascular;  Laterality: Right;  . LUMBAR LAMINECTOMY/DECOMPRESSION MICRODISCECTOMY N/A 09/06/2014   Procedure: LUMBER DECOMPRESSION L3-5;  Surgeon: Melina Schools, MD;  Location: [facility];  Service: Orthopedics;  Laterality: N/A;  . LUMBAR SPINE SURGERY  2002  . PROSTATE BIOPSY    . PROSTATECTOMY  02/2007  . VASECTOMY         Family History  Problem Relation Age of Onset  . Kidney disease Mother   . Stroke Father   . Breast cancer Sister        breast progressed into bone  . Colon cancer Neg Hx   . Prostate cancer Neg Hx   . Pancreatic cancer Neg Hx     Social History   Tobacco Use  . Smoking status: Former Smoker    Packs/day: 1.00    Years: 40.00    Pack years: 40.00    Types: Cigarettes    Quit date: 06/28/2008    Years since quitting: 11.7  . Smokeless tobacco: Former Systems developer    Quit date: 06/28/2009  Vaping Use  . Vaping Use: Never used  Substance Use Topics  . Alcohol use: Yes    Alcohol/week: 3.0 standard drinks    Types: 2 Glasses of wine, 1 Cans of beer per week    Comment: may have 1 drink a week  . Drug use: No    Home Medications Prior to Admission medications   Medication Sig Start Date End Date Taking? Authorizing Provider  acetaminophen (TYLENOL) 325 MG tablet Take 2 tablets (650 mg total) by mouth every 6 (six) hours as needed for mild pain (or Fever >/= 101). 10/11/19   Allie Bossier, MD  amLODipine (NORVASC) 10 MG tablet Take 1 tablet (10 mg total) by mouth every evening. 10/17/19    Angiulli, Lavon Paganini, PA-C  atorvastatin (LIPITOR) 80 MG tablet Take 1 tablet (80 mg total) by mouth at bedtime. 10/17/19   Angiulli, Lavon Paganini, PA-C  calcium citrate (CALCITRATE - DOSED IN MG ELEMENTAL CALCIUM) 950 (200 Ca) MG tablet Take 1 tablet (200 mg of elemental calcium total) by mouth daily. 10/18/19   Angiulli, Lavon Paganini, PA-C  dexamethasone (DECADRON) 2 MG tablet Take 2 tablets (4 mg total) by mouth 2 (two) times daily. 02/05/20   Ventura Sellers, MD  levETIRAcetam (KEPPRA) 1000 MG tablet Take 1 tablet (1,000 mg total) by mouth 2 (two) times daily. 02/05/20   Vaslow, Acey Lav, MD  LORazepam (ATIVAN) 0.5 MG tablet Take 1 tablet (0.5 mg total) by mouth once as needed for up to 1 dose (MRI sedation). 03/11/20   Ventura Sellers, MD  losartan-hydrochlorothiazide (HYZAAR) 100-12.5 MG tablet Take 1 tablet by mouth daily. 11/18/19   [provider]  metoprolol succinate (TOPROL-XL) 25 MG 24 hr tablet Take 0.5 tablets (12.5 mg total) by mouth every evening. 10/17/19   Angiulli, Lavon Paganini, PA-C  ondansetron (ZOFRAN) 8 MG tablet Take 1 tablet (8 mg total) by mouth 2 (two) times daily as needed (nausea and vomiting). May take 30-60 minutes prior to Temodar administration if nausea/vomiting occurs. 10/30/19   Vaslow, Acey Lav, MD  polyethylene glycol (MIRALAX / GLYCOLAX) 17 g packet Take 17 g by mouth daily as needed (constipation). 10/11/19   Allie Bossier, MD  rivaroxaban (XARELTO) 20 MG TABS tablet Take 1 tablet (20 mg total) by mouth daily with supper. Patient not taking: Reported on 11/30/2019 10/17/19   Angiulli, Lavon Paganini, PA-C  temozolomide (TEMODAR) 140 MG capsule Take 1 capsule (140 mg total) by mouth daily. May take on an empty stomach to decrease nausea & vomiting. Patient not taking: Reported on 11/30/2019 10/30/19   Ventura Sellers, MD  temozolomide (TEMODAR) 140 MG capsule Take 3 capsules (420 mg total) by mouth daily. May take on an empty stomach to decrease nausea & vomiting. 11/30/19    Ventura Sellers, MD  temozolomide (TEMODAR) 140 MG capsule Take 1 capsule (140 mg total) by mouth daily. May take on an empty stomach to decrease nausea & vomiting. 01/02/20   Ventura Sellers, MD  temozolomide (TEMODAR) 140 MG capsule Take 1 capsule (140 mg total) by mouth daily. May take on an empty stomach to decrease nausea & vomiting. 02/05/20   Ventura Sellers, MD  temozolomide (TEMODAR) 180 MG capsule Take 1 capsule (180 mg total) by mouth daily. May take on an empty stomach to decrease nausea & vomiting. Patient not taking: Reported on 11/30/2019 10/30/19   Ventura Sellers, MD  temozolomide (TEMODAR) 180 MG capsule Take 1 capsule (180 mg total) by mouth daily. May take on an empty stomach to decrease nausea & vomiting. 01/02/20   Ventura Sellers, MD  temozolomide (TEMODAR) 180 MG capsule Take 1 capsule (180 mg total) by mouth daily. May take on an empty stomach to decrease nausea & vomiting. 02/05/20   Ventura Sellers, MD    Allergies    Lisinopril  Review of Systems   Review of Systems Level 5 caveat cannot obtain review of system due to active seizure and obtundation. Physical Exam Updated Vital Signs BP 139/80   Pulse (!) 120   Temp (!) 97.5 F (36.4 C) (Skin)   Resp 17   SpO2 (!) 85%   Physical Exam Constitutional:      Comments: Patient arrives in significant distress.  Leftward gaze and head turn.  Low amplitude convulsive movements of upper extremities.  HENT:     Head: Normocephalic and atraumatic.     Mouth/Throat:     Comments: Poor dentition.  Teeth clenched.  Airway is clear.  Patient does not have active secretions in the airway or blood.  He does have a nasal trumpet in place.  His breathing is spontaneous with some bag assist Eyes:     Comments: Eyes leftward deviated with nystagmus.  Cardiovascular:     Comments: Tachycardia monitor shows rate at 140s.  Cannot appreciate rub murmur gallop at this rate.  Distal pulses at the radius 2+ and  strong. Pulmonary:     Comments: Transmitted upper airway sounds but lungs grossly symmetric with airflow. Abdominal:     Comments: Abdomen is soft.  Not significantly distended.  Not significantly distended.  Musculoskeletal:  Comments: No deformities.  No significant peripheral edema.  Skin:    Comments: Skin is warm and dry.  Chest and abdominal wall appears slightly cyanotic.  However limbs are warm.  Distal pulses 2+.  Neurological:     Comments: Patient is obtunded.  On first arrival active seizure with left gaze deviation and low amplitude tonic-clonic movement of the arms.  As this be abated with treatment, patient began some spontaneous blinking and eyes came to midline.  Still not talking or following commands.     ED Results / Procedures / Treatments   Labs (all labs ordered are listed, but only abnormal results are displayed) Labs Reviewed  APTT - Abnormal; Notable for the following components:      Result Value   aPTT 21 (*)    All other components within normal limits  CBC - Abnormal; Notable for the following components:   Platelets 137 (*)    nRBC 0.4 (*)    All other components within normal limits  DIFFERENTIAL - Abnormal; Notable for the following components:   Abs Immature Granulocytes 0.14 (*)    All other components within normal limits  COMPREHENSIVE METABOLIC PANEL - Abnormal; Notable for the following components:   Sodium 134 (*)    Chloride 96 (*)    CO2 20 (*)    Glucose, Bld 495 (*)    BUN 28 (*)    Total Bilirubin 1.5 (*)    Anion gap 18 (*)    All other components within normal limits  PHOSPHORUS - Abnormal; Notable for the following components:   Phosphorus 5.2 (*)    All other components within normal limits  LACTIC ACID, PLASMA - Abnormal; Notable for the following components:   Lactic Acid, Venous 7.6 (*)    All other components within normal limits  I-STAT CHEM 8, ED - Abnormal; Notable for the following components:   Sodium 131 (*)     BUN 32 (*)    Glucose, Bld 514 (*)    Calcium, Ion 1.05 (*)    TCO2 21 (*)    All other components within normal limits  CBG MONITORING, ED - Abnormal; Notable for the following components:   Glucose-Capillary 445 (*)    All other components within normal limits  I-STAT ARTERIAL BLOOD GAS, ED - Abnormal; Notable for the following components:   pH, Arterial 7.236 (*)    pCO2 arterial 59.9 (*)    pO2, Arterial 176 (*)    Acid-base deficit 3.0 (*)    Sodium 132 (*)    All other components within normal limits  CBG MONITORING, ED - Abnormal; Notable for the following components:   Glucose-Capillary 407 (*)    All other components within normal limits  CBG MONITORING, ED - Abnormal; Notable for the following components:   Glucose-Capillary 322 (*)    All other components within normal limits  RESP PANEL BY RT-PCR (FLU A&B, COVID) ARPGX2  PROTIME-INR  MAGNESIUM  BETA-HYDROXYBUTYRIC ACID  LACTIC ACID, PLASMA  BETA-HYDROXYBUTYRIC ACID  CBC  BASIC METABOLIC PANEL  BLOOD GAS, ARTERIAL  BETA-HYDROXYBUTYRIC ACID    EKG EKG Interpretation  Date/Time:  Saturday March 16 2020 20:57:20 EST Ventricular Rate:  145 PR Interval:    QRS Duration: 155 QT Interval:  303 QTC Calculation: 471 R Axis:   -59 Text Interpretation: Sinus tachycardia Ventricular premature complex Right bundle branch block LVH by voltage unchangedd from previous except tachycardia and exagerated t wave changes due to rate Confirmed by Collan Schoenfeld,  Jeannie Done [phone]) on 03/17/2020 12:02:17 AM   Radiology CT Head Wo Contrast  Result Date: 03/16/2020 CLINICAL DATA:  Initial evaluation for acute seizure, history of right occipital glioblastoma, status post surgical debulking and XRT with concurrent Temodar. EXAM: CT HEAD WITHOUT CONTRAST TECHNIQUE: Contiguous axial images were obtained from the base of the skull through the vertex without intravenous contrast. COMPARISON:  Prior brain MRI from 03/13/2020. FINDINGS:  Brain: Postoperative changes from right occipital craniotomy for tumor debulking again seen. Residual masslike opacity within the underlying right occipital lobe again seen, grossly stable from prior MRI. Progressive calcification/mineralization at the overlying right occipital cortex as compared to most recent CT from June. Probable extension across the splenium again noted, better appreciated on prior MRI. Surrounding hypodensity within the right temporal occipital region not significantly changed. Similar mass effect on the right lateral ventricle with up to 4 mm of right-to-left shift. No evidence for interval hemorrhage or other complication. Remainder the brain is otherwise stable and negative in appearance. No other acute large vessel territory infarct or hemorrhage. No extra-axial collection. Vascular: No hyperdense vessel. Scattered vascular calcifications noted within the carotid siphons. Skull: Prior right craniotomy.  No acute finding. Sinuses/Orbits: Globes and orbital soft tissues within normal limits. Paranasal sinuses are largely clear. No significant mastoid effusion. Other: None. IMPRESSION: 1. No significant interval change in appearance of patient's known glioblastoma centered at the right occipital lobe. Surrounding edema and regional mass effect with up to 4 mm of right-to-left shift relatively unchanged. No interval hemorrhage or other complication. 2. Otherwise stable and negative head CT. No other acute intracranial finding. Electronically Signed   By: Jeannine Boga M.D.   On: 03/16/2020 22:49   DG Chest Port 1 View  Result Date: 03/16/2020 CLINICAL DATA:  Unresponsive. EXAM: PORTABLE CHEST 1 VIEW COMPARISON:  July 05, 2019 FINDINGS: The heart size is enlarged. The patient is status post prior median sternotomy. The lung volumes are low. There are trace bilateral pleural effusions. Bibasilar airspace disease is noted and favored to represent passive atelectasis. There is no  pneumothorax. No acute displaced fracture. Aortic calcifications are noted. IMPRESSION: 1. Low lung volumes with trace bilateral pleural effusions and bibasilar airspace disease, favored to represent passive atelectasis. 2. Cardiomegaly. Electronically Signed   By: Constance Holster M.D.   On: 03/16/2020 21:28    Procedures Procedures (including critical care time) CRITICAL CARE Performed by: Charlesetta Shanks   Total critical care time: 30 minutes  Critical care time was exclusive of separately billable procedures and treating other patients.  Critical care was necessary to treat or prevent imminent or life-threatening deterioration.  Critical care was time spent personally by me on the following activities: development of treatment plan with patient and/or surrogate as well as nursing, discussions with consultants, evaluation of patient's response to treatment, examination of patient, obtaining history from patient or surrogate, ordering and performing treatments and interventions, ordering and review of laboratory studies, ordering and review of radiographic studies, pulse oximetry and re-evaluation of patient's condition. Medications Ordered in ED Medications  lactated ringers infusion ( Intravenous New Bag/Given 03/16/20 2137)  levETIRAcetam (KEPPRA) IVPB 1500 mg/ 100 mL premix (1,500 mg Intravenous New Bag/Given 03/16/20 2355)  insulin regular, human (MYXREDLIN) 100 units/ 100 mL infusion (8.5 Units/hr Intravenous New Bag/Given 03/16/20 2354)  lactated ringers infusion ( Intravenous Rate/Dose Verify 03/16/20 2310)  dextrose 5 % in lactated ringers infusion (0 mLs Intravenous Hold 03/16/20 2306)  dextrose 50 % solution 0-50 mL (has no administration in  time range)  heparin injection 5,000 Units (has no administration in time range)  dexamethasone (DECADRON) injection 4 mg (has no administration in time range)  LORazepam (ATIVAN) injection 2 mg (has no administration in time range)    LORazepam (ATIVAN) 2 MG/ML injection ( Intravenous Given 03/16/20 2058)  LORazepam (ATIVAN) 2 MG/ML injection ( Intravenous Given 03/16/20 2105)  sodium chloride flush (NS) 0.9 % injection 3 mL (3 mLs Intravenous Given 03/16/20 2140)  lactated ringers bolus 500 mL (0 mLs Intravenous Stopped 03/16/20 2247)  levETIRAcetam (KEPPRA) IVPB 1000 mg/100 mL premix (0 mg Intravenous Stopped 03/16/20 2139)  LORazepam (ATIVAN) 2 MG/ML injection ( Intravenous Given 03/16/20 2054)  levETIRAcetam (KEPPRA) IVPB 1000 mg/100 mL premix (0 mg Intravenous Stopped 03/16/20 2138)  lactated ringers bolus 500 mL (500 mLs Intravenous New Bag/Given 03/16/20 2309)    ED Course  I have reviewed the triage vital signs and the nursing notes.  Pertinent labs & imaging results that were available during my care of the patient were reviewed by me and considered in my medical decision making (see chart for details).  Clinical Course as of Mar 17 1  Sat Mar 16, 2020  2315 Consult: Critical care physician.  Will see in the emergency department.   [MP]    Clinical Course User Index [MP] Charlesetta Shanks, MD   MDM Rules/Calculators/A&P                         EMS gave midazolam 2.5 mg and transported patient as a code stroke.  He was still actively convulsing on arrival to the emergency department.  He was given 4 mg of IV Ativan, with cessation of clinical convulsion, however he still had a leftward gaze with nystagmus and therefore he was given two additional milligrams of Ativan in addition to 2 g Keppra load.  With the last 2 mg of IV Ativan, he began having improvements in his mental status and stopped having gaze deviation.  Consideration was given to intubation on arrival however patient was showing improvement with benzodiazepine and Keppra.  Patient's oxygen saturation was 100%.  He was breathing spontaneously.  He was temporarily assisted with bag-valve-mask in synchrony with respirations.  Patient was then  transitioned to nonrebreather mask and maintaining oxygen saturation.  No pooling secretions in the airway.  No blood in the airway.  Remainder of diagnostic evaluation continued with CT scan and consultations. Final Clinical Impression(s) / ED Diagnoses Final diagnoses:  Glioblastoma ([place])  Status epilepticus ([place])  Diabetic ketoacidosis with coma associated with type 2 diabetes mellitus ([place])    Rx / DC Orders ED Discharge Orders    None       Charlesetta Shanks, MD 03/17/20 0004

## 2020-03-16 NOTE — ED Notes (Signed)
Patient remains nonverbal. Moving Rt arm independently, holding wife's hand. Opens eyes spontaneously. Does not follow commands.

## 2020-03-16 NOTE — Consult Note (Signed)
Neurology Consultation Reason for Consult: Status Epilepticus Referring Physician: Calla Kicks  CC: Status epilepticus  History is obtained from: Chart review, EMS  HPI: [name] is a 74 y.o. male with a history of glioblastoma undergoing chemotherapy with [name] as well as history of seizures on Keppra who presents with status epilepticus.  Family apparently heard a thump and subsequently, found him seizing.  EMS was called, and on EMS arrival, he was postictal, not actively seizing.  He was confused, however then EMS noted that he looked to the left and began having convulsive activity.  They gave him midazolam 2.5 mg and transported him as a code stroke.  He was still actively convulsing on arrival to the emergency department.  He was given 4 mg of IV Ativan, with cessation of clinical convulsion, however he still had a leftward gaze with nystagmus and therefore he was given two additional milligrams of Ativan in addition to 2 g Keppra load.  With the last 2 mg of IV Ativan, he began having improvements in his mental status and stopped having gaze deviation.   ROS:  Unable to obtain due to altered mental status.   Past Medical History:  Diagnosis Date  . Anxiety   . Arthritis   . ASCVD (arteriosclerotic cardiovascular disease)    03/2004: DES x2 to the RCA; IMI with stent occlusion in 02/2005- suboptimal Plavix compliance  . CAD (coronary artery disease)   . Carcinoma of prostate ([place])    Clopidogrel held for biopsy  . Constipation due to pain medication   . Diabetes mellitus without complication ([place])   . Diabetic neuropathy ([place])   . Headache 2021  . Hyperlipidemia   . Hypertension   . Mild depression ([place])   . Morbid obesity ([place])   . Myocardial infarction ([place])   . Shortness of breath dyspnea   . Sleep apnea    no longer uses cpap  . Stroke ([place]) 2017  . Tobacco abuse    stopped smoking 06/28/09     Family History  Problem Relation Age of Onset  . Kidney  disease Mother   . Stroke Father   . Breast cancer Sister        breast progressed into bone  . Colon cancer Neg Hx   . Prostate cancer Neg Hx   . Pancreatic cancer Neg Hx      Social History:  reports that he quit smoking about 11 years ago. His smoking use included cigarettes. He has a 40.00 pack-year smoking history. He quit smokeless tobacco use about 10 years ago. He reports current alcohol use of about 3.0 standard drinks of alcohol per week. He reports that he does not use drugs.   Exam: Current vital signs: There were no vitals filed for this visit.  Vital signs in last 24 hours:     Physical Exam  Constitutional: Appears well-developed and well-nourished.  Psych: Affect appropriate to situation Eyes: No scleral injection HENT: No OP obstruction, c-collar in place MSK: no joint deformities.  Cardiovascular: Normal rate and regular rhythm.  Respiratory: Effort normal, non-labored breathing GI: Soft.  No distension. There is no tenderness.  Skin: WDI  Neuro: Mental Status: Patient is obtunded, no response to voice or pain Cranial Nerves: II: No blink ot threat.  Pupils are equal, round, and reactive to light.   III,IV, VI: L gaze deviation.  He does not have a corneal on the left VII: Facial movement with left-sided clonic activity Motor: He has  no reaction to noxious stimulation in any extremity, he has clonic activity only at the left arm and face Sensory: As above  Cerebellar: Does not perform  I have reviewed labs in epic and the results pertinent to this consultation are: Glucose 514 Creatinine 0.9  I have reviewed the images obtained: CT head- significant edema in the right parietooccipital region.   Impression: 74 year old male presenting with focal status epilepticus in the setting of known glioblastoma.  He appears to have stopped seizing, and is having some improvements in his mental status at this point.  Recommendations: 1) Keppra 2 g x 1  followed by 1500 mg twice daily 2) continue close observation 3) routine EEG in the morning.    Roland Rack, MD [facility] [phone]  If 7pm- 7am, please page neurology on call as listed in [place].

## 2020-03-16 NOTE — Progress Notes (Signed)
  On re-examination, he still has left hemiparesis, but now is purposeful on the right side, withdrawing on the left. With making progress, suspect continued encephalopathy likley due to medication/prolonged seizure.  Low concern for ongoing seizure, will get EEG in the morning.Roland Rack, MD [facility] [phone]  If 7pm- 7am, please page neurology on call as listed in [place].

## 2020-03-16 NOTE — ED Notes (Signed)
[place] increased to 4L/min due to SpO2 of 90%. No respiratory distress noted.

## 2020-03-16 NOTE — ED Notes (Signed)
Date and time results received: 03/16/20 10:16 PM  Test: Lactic acid Critical Value: 7.6  Name of Provider Notified: Dr. Johnney Killian  Message sent, awaiting orders

## 2020-03-16 NOTE — ED Notes (Signed)
Wife at bedside. Reports pt was ambulating with walker coming out of bathroom and he looked at her funny, then fell backwards. She denies any obvious head/neck trauma with fall. She reports he was normal before same, ate dinner with family.

## 2020-03-16 NOTE — ED Notes (Signed)
Cleared by neuro to go to CT

## 2020-03-16 NOTE — ED Notes (Addendum)
Patient removed from nonrebreather s/p ABG results/provider evaluation. Patient placed on 2L O2 via [place]. Patient remains unresponsive. No nystamus/tremors/deviated gaze noted at this time. Skin is more pink and warm, less cyanosis noted. Wife remains at bedside.

## 2020-03-17 ENCOUNTER — Inpatient Hospital Stay (HOSPITAL_COMMUNITY): Payer: Medicare Other

## 2020-03-17 DIAGNOSIS — G40901 Epilepsy, unspecified, not intractable, with status epilepticus: Secondary | ICD-10-CM | POA: Diagnosis not present

## 2020-03-17 DIAGNOSIS — R569 Unspecified convulsions: Secondary | ICD-10-CM | POA: Diagnosis not present

## 2020-03-17 LAB — CBC
HCT: 41.3 % (ref 39.0–52.0)
Hemoglobin: 14.2 g/dL (ref 13.0–17.0)
MCH: 30.4 pg (ref 26.0–34.0)
MCHC: 34.4 g/dL (ref 30.0–36.0)
MCV: 88.4 fL (ref 80.0–100.0)
Platelets: 84 10*3/uL — ABNORMAL LOW (ref 150–400)
RBC: 4.67 MIL/uL (ref 4.22–5.81)
RDW: 13.7 % (ref 11.5–15.5)
WBC: 2.2 10*3/uL — ABNORMAL LOW (ref 4.0–10.5)
nRBC: 0 % (ref 0.0–0.2)

## 2020-03-17 LAB — GLUCOSE, CAPILLARY
Glucose-Capillary: 101 mg/dL — ABNORMAL HIGH (ref 70–99)
Glucose-Capillary: 112 mg/dL — ABNORMAL HIGH (ref 70–99)
Glucose-Capillary: 137 mg/dL — ABNORMAL HIGH (ref 70–99)
Glucose-Capillary: 137 mg/dL — ABNORMAL HIGH (ref 70–99)
Glucose-Capillary: 143 mg/dL — ABNORMAL HIGH (ref 70–99)
Glucose-Capillary: 145 mg/dL — ABNORMAL HIGH (ref 70–99)
Glucose-Capillary: 156 mg/dL — ABNORMAL HIGH (ref 70–99)
Glucose-Capillary: 156 mg/dL — ABNORMAL HIGH (ref 70–99)
Glucose-Capillary: 160 mg/dL — ABNORMAL HIGH (ref 70–99)
Glucose-Capillary: 163 mg/dL — ABNORMAL HIGH (ref 70–99)
Glucose-Capillary: 167 mg/dL — ABNORMAL HIGH (ref 70–99)
Glucose-Capillary: 170 mg/dL — ABNORMAL HIGH (ref 70–99)
Glucose-Capillary: 173 mg/dL — ABNORMAL HIGH (ref 70–99)
Glucose-Capillary: 176 mg/dL — ABNORMAL HIGH (ref 70–99)

## 2020-03-17 LAB — BASIC METABOLIC PANEL
Anion gap: 9 (ref 5–15)
BUN: 17 mg/dL (ref 8–23)
CO2: 27 mmol/L (ref 22–32)
Calcium: 8.6 mg/dL — ABNORMAL LOW (ref 8.9–10.3)
Chloride: 99 mmol/L (ref 98–111)
Creatinine, Ser: 0.7 mg/dL (ref 0.61–1.24)
GFR, Estimated: >60 mL/min (ref >60)
Glucose, Bld: 154 mg/dL — ABNORMAL HIGH (ref 70–99)
Potassium: 4 mmol/L (ref 3.5–5.1)
Sodium: 135 mmol/L (ref 135–145)

## 2020-03-17 LAB — I-STAT ARTERIAL BLOOD GAS, ED
Acid-Base Excess: 1 mmol/L (ref 0.0–2.0)
Bicarbonate: 26 mmol/L (ref 20.0–28.0)
Calcium, Ion: 1.23 mmol/L (ref 1.15–1.40)
HCT: 37 % — ABNORMAL LOW (ref 39.0–52.0)
Hemoglobin: 12.6 g/dL — ABNORMAL LOW (ref 13.0–17.0)
O2 Saturation: 96 %
Patient temperature: 97.5
Potassium: 3.9 mmol/L (ref 3.5–5.1)
Sodium: 135 mmol/L (ref 135–145)
TCO2: 27 mmol/L (ref 22–32)
pCO2 arterial: 39.3 mmHg (ref 32.0–48.0)
pH, Arterial: 7.426 (ref 7.350–7.450)
pO2, Arterial: 77 mmHg — ABNORMAL LOW (ref 83.0–108.0)

## 2020-03-17 LAB — MRSA PCR SCREENING: MRSA by PCR: NEGATIVE

## 2020-03-17 LAB — CBG MONITORING, ED: Glucose-Capillary: 265 mg/dL — ABNORMAL HIGH (ref 70–99)

## 2020-03-17 LAB — BETA-HYDROXYBUTYRIC ACID: Beta-Hydroxybutyric Acid: 0.12 mmol/L (ref 0.05–0.27)

## 2020-03-17 LAB — LACTIC ACID, PLASMA: Lactic Acid, Venous: 3.5 mmol/L (ref 0.5–1.9)

## 2020-03-17 MED ORDER — CHLORHEXIDINE GLUCONATE CLOTH 2 % EX PADS
6.0000 | MEDICATED_PAD | Freq: Every day | CUTANEOUS | Status: DC
  Administered 2020-03-20 – 2020-03-21 (×2): 6 via TOPICAL

## 2020-03-17 MED ORDER — INSULIN ASPART 100 UNIT/ML ~~LOC~~ SOLN
0.0000 [IU] | SUBCUTANEOUS | Status: DC
  Administered 2020-03-17 – 2020-03-18 (×2): 3 [IU] via SUBCUTANEOUS

## 2020-03-17 MED ORDER — INSULIN GLARGINE 100 UNIT/ML ~~LOC~~ SOLN
20.0000 [IU] | Freq: Once | SUBCUTANEOUS | Status: AC
  Administered 2020-03-17: 20 [IU] via SUBCUTANEOUS
  Filled 2020-03-17: qty 0.2

## 2020-03-17 NOTE — Progress Notes (Signed)
NAME:  [name], MRN:  [id number], DOB:  1945/08/07, LOS: 1 ADMISSION DATE:  03/16/2020, CONSULTATION DATE: 03/16/2020 REFERRING MD: Dr. Leeanne Mannan, ED, CHIEF COMPLAINT: Seizure  Brief History   Patient is a 74 year old male glioblastoma status post resection in March, on chemotherapy presented with frequent seizures.  Seen by neurology recommend loading Keppra and increasing his Keppra dose  Past Medical History   . Anxiety   . Arthritis   . ASCVD (arteriosclerotic cardiovascular disease)    03/2004: DES x2 to the RCA; IMI with stent occlusion in 02/2005- suboptimal Plavix compliance  . CAD (coronary artery disease)   . Carcinoma of prostate ([place])    Clopidogrel held for biopsy  . Constipation due to pain medication   . Diabetes mellitus without complication ([place])   . Diabetic neuropathy ([place])   . Headache 2021  . Hyperlipidemia   . Hypertension   . Mild depression ([place])   . Morbid obesity ([place])   . Myocardial infarction ([place])   . Shortness of breath dyspnea   . Sleep apnea    no longer uses cpap  . Stroke ([place]) 2017  . Tobacco abuse    stopped smoking 06/28/09     Significant Hospital Events   Presents to the emergency room in status 03/16/2020  Consults:  Neurology  Procedures:  NA  Significant Diagnostic Tests:  CT scan of the brain (unchanged)  Micro Data:  NA  Antimicrobials:  NA  Interim history/subjective:  Patient is a little lethargic but opens eyes following commands.  Has chronic left-sided weakness  Objective   Blood pressure (!) 141/89, pulse (!) 105, temperature 98.9 F (37.2 C), temperature source Oral, resp. rate (!) 22, SpO2 97 %.        Intake/Output Summary (Last 24 hours) at 03/17/2020 1235 Last data filed at 03/17/2020 1100 Gross per 24 hour  Intake 376.36 ml  Output 100 ml  Net 276.36 ml   There were no vitals filed for this visit.  Examination: General: Elderly Caucasian male, lying on  the bed HENT: Atraumatic, normocephalic, dry mucous membranes Lungs: Clear to auscultation bilaterally Cardiovascular: Regular rate and rhythm Abdomen: Nontender, nondistended, bowel sounds positive Neuro: Lethargic but opens eyes and following commands.  Continues to have residual left-sided weakness from prior stroke, antigravity on right side Skin: No rash  Resolved Hospital Problem list     Assessment & Plan:  Recurrent seizures Patient was given Ativan in the emergency department and loaded with 2 g of Keppra Continue Keppra 1.5 g IV twice daily.  Neurology input is appreciated  Glioblastoma with slight surrounding edema Repeat CT scan did not show any change, continue dexamethasone  Poorly controlled diabetes with hyperglycemia High anion gap metabolic acidosis with combination of DKA and lactic acidosis Patient was started on insulin infusion, now his blood sugars are better controlled with correction of anion gap Transition insulin drip over to Lantus 20 units, continue sliding scale insulin  Best practice (evaluated daily)   Diet: N.p.o. speech and swallow evaluation Pain/Anxiety/Delirium protocol (if indicated): N/A VAP protocol (if indicated): N/A DVT prophylaxis: N/A GI prophylaxis: N/A Glucose control: As above Mobility: Bedrest Code Status: Full Disposition: Transfer to medical/surgical floor Family communication: Patient's wife was updated at bedside  Labs   CBC: Recent Labs  Lab 03/16/20 2103 03/16/20 2107 03/16/20 2144 03/17/20 0123  WBC 7.5  --   --   --   NEUTROABS 5.9  --   --   --  HGB 15.2 15.3 14.6 12.6*  HCT 45.8 45.0 43.0 37.0*  MCV 91.8  --   --   --   PLT 137*  --   --   --     Basic Metabolic Panel: Recent Labs  Lab 03/16/20 2103 03/16/20 2107 03/16/20 2144 03/17/20 0123  NA 134* 131* 132* 135  K 4.8 4.8 4.8 3.9  CL 96* 100  --   --   CO2 20*  --   --   --   GLUCOSE 495* 514*  --   --   BUN 28* 32*  --   --   CREATININE  1.22 0.90  --   --   CALCIUM 9.0  --   --   --   MG 1.9  --   --   --   PHOS 5.2*  --   --   --    GFR: CrCl cannot be calculated (Unknown ideal weight.). Recent Labs  Lab 03/16/20 2103 03/16/20 2104 03/16/20 2304  WBC 7.5  --   --   LATICACIDVEN  --  7.6* 3.5*    Liver Function Tests: Recent Labs  Lab 03/16/20 2103  AST 25  ALT 30  ALKPHOS 70  BILITOT 1.5*  PROT 6.6  ALBUMIN 3.8   No results for input(s): LIPASE, AMYLASE in the last 168 hours. No results for input(s): AMMONIA in the last 168 hours.  ABG    Component Value Date/Time   PHART 7.426 03/17/2020 0123   PCO2ART 39.3 03/17/2020 0123   PO2ART 77 (L) 03/17/2020 0123   HCO3 26.0 03/17/2020 0123   TCO2 27 03/17/2020 0123   ACIDBASEDEF 3.0 (H) 03/16/2020 2144   O2SAT 96.0 03/17/2020 0123     Coagulation Profile: Recent Labs  Lab 03/16/20 2103  INR 0.9    Cardiac Enzymes: No results for input(s): CKTOTAL, CKMB, CKMBINDEX, TROPONINI in the last 168 hours.  HbA1C: Hgb A1c MFr Bld  Date/Time Value Ref Range Status  10/08/2019 07:19 PM 6.9 (H) 4.8 - 5.6 % Final    Comment:    (NOTE)         Prediabetes: 5.7 - 6.4         Diabetes: >6.4         Glycemic control for adults with diabetes: <7.0   07/04/2019 10:58 AM 6.0 (H) 4.8 - 5.6 % Final    Comment:    (NOTE) Pre diabetes:          5.7%-6.4% Diabetes:              >6.4% Glycemic control for   <7.0% adults with diabetes     CBG: Recent Labs  Lab 03/17/20 0503 03/17/20 0608 03/17/20 0712 03/17/20 0821 03/17/20 0930  GLUCAP 143* 137* 170* 156* 156*  .  Past Medical History  He,  has a past medical history of Anxiety, Arthritis, ASCVD (arteriosclerotic cardiovascular disease), CAD (coronary artery disease), Carcinoma of prostate ([place]), Constipation due to pain medication, Diabetes mellitus without complication ([place]), Diabetic neuropathy ([place]), Headache (2021), Hyperlipidemia, Hypertension, Mild depression ([place]), Morbid obesity ([place]),  Myocardial infarction [facility]), Shortness of breath dyspnea, Sleep apnea, Stroke ([place]) (2017), and Tobacco abuse.   Surgical History    Past Surgical History:  Procedure Laterality Date  . APPLICATION OF CRANIAL NAVIGATION N/A 07/04/2019   Procedure: APPLICATION OF CRANIAL NAVIGATION;  Surgeon: Ashok Pall, MD;  Location: [facility];  Service: Neurosurgery;  Laterality: N/A;  . BACK SURGERY    . CARDIAC CATHETERIZATION  X 1 stent before having CABG  . CORONARY ARTERY BYPASS GRAFT  06/28/2008   X4  . CRANIOTOMY Right 07/04/2019   Procedure: Right occipital craniotomy for tumor with brainlab;  Surgeon: Ashok Pall, MD;  Location: [place] [facility];  Service: Neurosurgery;  Laterality: Right;  . ENDARTERECTOMY Right 07/03/2015   Procedure: RIGHT CAROTID ENDARTERECTOMY WITH BOVINE PERICARDIUM PATCH ANGIOPLASTY;  Surgeon: Serafina Mitchell, MD;  Location: [facility];  Service: Vascular;  Laterality: Right;  . LUMBAR LAMINECTOMY/DECOMPRESSION MICRODISCECTOMY N/A 09/06/2014   Procedure: LUMBER DECOMPRESSION L3-5;  Surgeon: Melina Schools, MD;  Location: [place];  Service: Orthopedics;  Laterality: N/A;  . LUMBAR SPINE SURGERY  2002  . PROSTATE BIOPSY    . PROSTATECTOMY  02/2007  . VASECTOMY       Social History   reports that he quit smoking about 11 years ago. His smoking use included cigarettes. He has a 40.00 pack-year smoking history. He quit smokeless tobacco use about 10 years ago. He reports current alcohol use of about 3.0 standard drinks of alcohol per week. He reports that he does not use drugs.   Family History   His family history includes Breast cancer in his sister; Kidney disease in his mother; Stroke in his father. There is no history of Colon cancer, Prostate cancer, or Pancreatic cancer.   Allergies Allergies  Allergen Reactions  . Lisinopril Cough     Home Medications  Prior to Admission medications   Medication Sig Start Date End Date Taking? Authorizing Provider  acetaminophen (TYLENOL)  325 MG tablet Take 2 tablets (650 mg total) by mouth every 6 (six) hours as needed for mild pain (or Fever >/= 101). 10/11/19   Allie Bossier, MD  amLODipine (NORVASC) 10 MG tablet Take 1 tablet (10 mg total) by mouth every evening. 10/17/19   Angiulli, Lavon Paganini, PA-C  atorvastatin (LIPITOR) 80 MG tablet Take 1 tablet (80 mg total) by mouth at bedtime. 10/17/19   Angiulli, Lavon Paganini, PA-C  calcium citrate (CALCITRATE - DOSED IN MG ELEMENTAL CALCIUM) 950 (200 Ca) MG tablet Take 1 tablet (200 mg of elemental calcium total) by mouth daily. 10/18/19   Angiulli, Lavon Paganini, PA-C  dexamethasone (DECADRON) 2 MG tablet Take 2 tablets (4 mg total) by mouth 2 (two) times daily. 02/05/20   Ventura Sellers, MD  levETIRAcetam (KEPPRA) 1000 MG tablet Take 1 tablet (1,000 mg total) by mouth 2 (two) times daily. 02/05/20   Vaslow, Acey Lav, MD  LORazepam (ATIVAN) 0.5 MG tablet Take 1 tablet (0.5 mg total) by mouth once as needed for up to 1 dose (MRI sedation). 03/11/20   Ventura Sellers, MD  losartan-hydrochlorothiazide (HYZAAR) 100-12.5 MG tablet Take 1 tablet by mouth daily. 11/18/19   [provider]  metoprolol succinate (TOPROL-XL) 25 MG 24 hr tablet Take 0.5 tablets (12.5 mg total) by mouth every evening. 10/17/19   Angiulli, Lavon Paganini, PA-C  ondansetron (ZOFRAN) 8 MG tablet Take 1 tablet (8 mg total) by mouth 2 (two) times daily as needed (nausea and vomiting). May take 30-60 minutes prior to Temodar administration if nausea/vomiting occurs. 10/30/19   Vaslow, Acey Lav, MD  polyethylene glycol (MIRALAX / GLYCOLAX) 17 g packet Take 17 g by mouth daily as needed (constipation). 10/11/19   Allie Bossier, MD  rivaroxaban (XARELTO) 20 MG TABS tablet Take 1 tablet (20 mg total) by mouth daily with supper. Patient not taking: Reported on 11/30/2019 10/17/19   Cathlyn Parsons, PA-C  temozolomide [facility]) 140  MG capsule Take 1 capsule (140 mg total) by mouth daily. May take on an empty stomach to decrease  nausea & vomiting. Patient not taking: Reported on 11/30/2019 10/30/19   Ventura Sellers, MD  temozolomide (TEMODAR) 140 MG capsule Take 3 capsules (420 mg total) by mouth daily. May take on an empty stomach to decrease nausea & vomiting. 11/30/19   Ventura Sellers, MD  temozolomide (TEMODAR) 140 MG capsule Take 1 capsule (140 mg total) by mouth daily. May take on an empty stomach to decrease nausea & vomiting. 01/02/20   Ventura Sellers, MD  temozolomide (TEMODAR) 140 MG capsule Take 1 capsule (140 mg total) by mouth daily. May take on an empty stomach to decrease nausea & vomiting. 02/05/20   Ventura Sellers, MD  temozolomide (TEMODAR) 180 MG capsule Take 1 capsule (180 mg total) by mouth daily. May take on an empty stomach to decrease nausea & vomiting. Patient not taking: Reported on 11/30/2019 10/30/19   Ventura Sellers, MD  temozolomide (TEMODAR) 180 MG capsule Take 1 capsule (180 mg total) by mouth daily. May take on an empty stomach to decrease nausea & vomiting. 01/02/20   Ventura Sellers, MD  temozolomide (TEMODAR) 180 MG capsule Take 1 capsule (180 mg total) by mouth daily. May take on an empty stomach to decrease nausea & vomiting. 02/05/20   Vaslow, Acey Lav, MD      Jacky Kindle MD [facility] Pager: [phone] Mobile: [phone]

## 2020-03-17 NOTE — Progress Notes (Signed)
NEUROLOGY CONSULTATION PROGRESS NOTE   Date of service: March 17, 2020 Patient Name: [name] MRN:  [id number] DOB:  1945-07-03  Brief HPI  [name] is a 74 y.o. 74 year old male presenting with focal status epilepticus in the setting of known R occipital glioblastoma. Resolved with 4mg  Ativan and 2g Keppra load. Interval Hx   He he is somnolent but awakens and responds appropriately and follows commands.  He has weakness on the left side but on discussion with his wife was present at the bedside, this is chronic.  Vitals   Vitals:   03/17/20 0200 03/17/20 0300 03/17/20 0400 03/17/20 0500  BP: 120/89 107/77 (!) 133/99 100/75  Pulse: (!) 117 (!) 53 89 (!) 105  Resp: (!) 26 19 (!) 27 (!) 25  Temp:   98.9 F (37.2 C)   TempSrc:   Oral   SpO2: 96% 97% 93% 100%     There is no height or weight on file to calculate BMI.  Physical Exam   General: Laying comfortably in bed; in no acute distress.  HENT: Normal oropharynx and mucosa. Normal external appearance of ears and nose.  Neck: Supple, no pain or tenderness  CV: No JVD. No peripheral edema.  Pulmonary: Symmetric Chest rise. Normal respiratory effort.  Abdomen: Soft to touch, non-tender.  Ext: No cyanosis, edema, or deformity  Skin: No rash. Normal palpation of skin.   Musculoskeletal: Normal digits and nails by inspection. No clubbing.   Neurologic Examination  Mental status/Cognition: Somnolent but opens eyes to voice, oriented to self, place, month and year, poor attention Speech/language: Fluent, comprehension intact, object naming intact, repetition intact. Cranial nerves:   CN II Pupils equal and reactive to light, no VF deficits   CN III,IV,VI EOM intact, no gaze preference or deviation, no nystagmus   CN V normal sensation in V1, V2, and V3 segments bilaterally   CN VII no asymmetry, no nasolabial fold flattening   CN VIII normal hearing to speech   CN IX & X normal palatal elevation, no uvular  deviation   CN XI    CN XII midline tongue protrusion   Motor:  Muscle bulk: normal, tone normal  Left upper extremity is a 3 out of 5. Left lower extremity is a 3 out of 5. Right upper extremity is 4+ out of 5. Right lower extremity is a 4+ out of 5.  Sensation:  Light touch Intact throughout   Pin prick    Temperature    Vibration   Proprioception     Labs   Basic Metabolic Panel:  Lab Results  Component Value Date   NA 135 03/17/2020   K 3.9 03/17/2020   CO2 20 (L) 03/16/2020   GLUCOSE 514 (HH) 03/16/2020   BUN 32 (H) 03/16/2020   CREATININE 0.90 03/16/2020   CALCIUM 9.0 03/16/2020   GFRNONAA >60 03/16/2020   GFRAA >60 01/02/2020   HbA1c:  Lab Results  Component Value Date   HGBA1C 6.9 (H) 10/08/2019   LDL:  Lab Results  Component Value Date   LDLCALC 67 05/04/2019   Urine Drug Screen:     Component Value Date/Time   LABOPIA NONE DETECTED 06/18/2015 1901   COCAINSCRNUR NONE DETECTED 06/18/2015 1901   LABBENZ NONE DETECTED 06/18/2015 1901   AMPHETMU NONE DETECTED 06/18/2015 1901   THCU NONE DETECTED 06/18/2015 1901   LABBARB NONE DETECTED 06/18/2015 1901    Alcohol Level     Component Value Date/Time   ETH <5  06/18/2015 1805   No results found for: PHENYTOIN, ZONISAMIDE, LAMOTRIGINE, LEVETIRACETA No results found for: PHENYTOIN, PHENOBARB, VALPROATE, CBMZ  Imaging and Diagnostic studies  Results for orders placed during the hospital encounter of 03/16/20  CT Head Wo Contrast  Narrative CLINICAL DATA:  Initial evaluation for acute seizure, history of right occipital glioblastoma, status post surgical debulking and XRT with concurrent Temodar.  EXAM: CT HEAD WITHOUT CONTRAST  1. No significant interval change in appearance of patient's known glioblastoma centered at the right occipital lobe. Surrounding edema and regional mass effect with up to 4 mm of right-to-left shift relatively unchanged. No interval hemorrhage or other  complication. 2. Otherwise stable and negative head CT. No other acute intracranial finding.    Impression   [name] is a 74 year old male presenting with focal status epilepticus in the setting of known glioblastoma. He is somnolent, but wakes up appropriately and follows commands. Has some LUE and LLE weakness but on discussion with wife, that is chronic.  Recommendations  - continue Keppra 1500mg  BID - Continue home Decadron. - routine EEG is pending. - No further inpatient workup if rEEG is negative for any seizures. ______________________________________________________________________   Thank you for the opportunity to take part in the care of this patient. If you have any further questions, please contact the neurology consultation attending.  Signed,  [place] Pager Number [phone]

## 2020-03-17 NOTE — Progress Notes (Signed)
EEG complete - results pending 

## 2020-03-17 NOTE — Progress Notes (Signed)
eLink Physician-Brief Progress Note Patient Name: [name] DOB: 08-20-1945 MRN: [id number]   Date of Service  03/17/2020  HPI/Events of Note  Reviewed new admission to ICU. 34M with hx of glioblastoma s/p surgical resection 06/2019 and seeding chemotherapy who was BIBA for status epilepticus. Improved with a total of 6mg  Ativan and 2g Keppra load. Did not require intubation. NCHCT stable compared to prior. Is on Keppra 1g BID at home + Decadron.  On exam, tachycardic to 110s, normotensive, afebrile. Per RN his mental status has been improving over time after receiving Ativan earlier, and he is responding to questions. Weak on the left side (chronic).   eICU Interventions  # Neuro: - AEDs per Neuro - now ordered for Keppra 1.5g BID. - EEG in AM per Neuro. - Decadron for vasogenic edema.  # Endo: - Insulin drip for hyperglycemia without ketosis. - LR / D5 LR as outlined below.  # Renal: - LR at 125cc/hr for mIVF, switching to D5 LR when CBG < 250.      Intervention Category Evaluation Type: New Patient Evaluation  Charlott Rakes 03/17/2020, 1:56 AM

## 2020-03-17 NOTE — Procedures (Addendum)
Patient Name: [name]  MRN: [id number]  Epilepsy Attending: Lora Havens  Referring Physician/Provider: Dr Roland Rack Date: 03/17/2020 Duration: 27.51 mins  Patient history: 74 year old male presenting with focal status epilepticus in the setting of known glioblastoma. EEG to evaluate for seizure.  Level of alertness: Awake, asleep  AEDs during EEG study: LEV  Technical aspects: This EEG study was done with scalp electrodes positioned according to the 10-20 International system of electrode placement. Electrical activity was acquired at a sampling rate of 500Hz  and reviewed with a high frequency filter of 70Hz  and a low frequency filter of 1Hz . EEG data were recorded continuously and digitally stored.   Description: No posterior dominant rhythm was seen. Sleep was characterized by vertex waves, sleep spindles (12 to 14 Hz), maximal frontocentral region.  EEG showed continuous generalized 3 to 6 Hz theta-delta slowing with overriding 15-18hz  beta activity. Sharp waves were seen in right parieto-occipital region, at times periodic at 0.5Hz , maximal P4. Hyperventilation and photic stimulation were not performed.     ABNORMALITY - Lateralized periodic discharges, right hemisphere, maximal P4  Sharp waves, right parieto-occipital region -Continuous slow, generalized  IMPRESSION: This study showed evidence of epileptogenicity arising from right parieto-occipital region due to underlying mass. LPD are on the ictal-interictal continuum with high potential for seizures. There is also mild diffuse encephalopathy, nonspecific etiology. No definite  seizures were seen throughout the recording.   Cordarius Benning Barbra Sarks

## 2020-03-18 ENCOUNTER — Telehealth: Payer: Self-pay | Admitting: *Deleted

## 2020-03-18 DIAGNOSIS — E1111 Type 2 diabetes mellitus with ketoacidosis with coma: Secondary | ICD-10-CM

## 2020-03-18 DIAGNOSIS — G40901 Epilepsy, unspecified, not intractable, with status epilepticus: Secondary | ICD-10-CM | POA: Diagnosis not present

## 2020-03-18 DIAGNOSIS — E119 Type 2 diabetes mellitus without complications: Secondary | ICD-10-CM

## 2020-03-18 DIAGNOSIS — C719 Malignant neoplasm of brain, unspecified: Secondary | ICD-10-CM | POA: Diagnosis not present

## 2020-03-18 DIAGNOSIS — E1110 Type 2 diabetes mellitus with ketoacidosis without coma: Secondary | ICD-10-CM | POA: Diagnosis not present

## 2020-03-18 LAB — GLUCOSE, CAPILLARY
Glucose-Capillary: 173 mg/dL — ABNORMAL HIGH (ref 70–99)
Glucose-Capillary: 88 mg/dL (ref 70–99)
Glucose-Capillary: 107 mg/dL — ABNORMAL HIGH (ref 70–99)
Glucose-Capillary: 217 mg/dL — ABNORMAL HIGH (ref 70–99)
Glucose-Capillary: 191 mg/dL — ABNORMAL HIGH (ref 70–99)

## 2020-03-18 LAB — HEMOGLOBIN A1C
Hgb A1c MFr Bld: 8.2 % — ABNORMAL HIGH (ref 4.8–5.6)
Mean Plasma Glucose: 188.64 mg/dL

## 2020-03-18 MED ORDER — MELATONIN 3 MG PO TABS
3.0000 mg | ORAL_TABLET | Freq: Every day | ORAL | Status: DC
  Administered 2020-03-18 – 2020-03-20 (×3): 3 mg via ORAL
  Filled 2020-03-18 (×4): qty 1

## 2020-03-18 MED ORDER — INSULIN ASPART 100 UNIT/ML ~~LOC~~ SOLN
4.0000 [IU] | Freq: Three times a day (TID) | SUBCUTANEOUS | Status: DC
  Administered 2020-03-18 – 2020-03-19 (×4): 4 [IU] via SUBCUTANEOUS

## 2020-03-18 MED ORDER — METOPROLOL TARTRATE 5 MG/5ML IV SOLN
5.0000 mg | Freq: Once | INTRAVENOUS | Status: AC
  Administered 2020-03-18: 5 mg via INTRAVENOUS

## 2020-03-18 MED ORDER — METOPROLOL TARTRATE 5 MG/5ML IV SOLN
INTRAVENOUS | Status: AC
  Filled 2020-03-18: qty 5

## 2020-03-18 MED ORDER — HALOPERIDOL LACTATE 5 MG/ML IJ SOLN
1.0000 mg | Freq: Four times a day (QID) | INTRAMUSCULAR | Status: DC | PRN
  Administered 2020-03-18 – 2020-03-19 (×3): 1 mg via INTRAVENOUS
  Filled 2020-03-18 (×3): qty 1

## 2020-03-18 MED ORDER — INSULIN ASPART 100 UNIT/ML ~~LOC~~ SOLN
0.0000 [IU] | Freq: Every day | SUBCUTANEOUS | Status: DC
  Administered 2020-03-19: 4 [IU] via SUBCUTANEOUS

## 2020-03-18 MED ORDER — HALOPERIDOL LACTATE 5 MG/ML IJ SOLN
2.0000 mg | Freq: Once | INTRAMUSCULAR | Status: AC
  Administered 2020-03-18: 2 mg via INTRAVENOUS
  Filled 2020-03-18: qty 1

## 2020-03-18 MED ORDER — INSULIN ASPART 100 UNIT/ML ~~LOC~~ SOLN
0.0000 [IU] | Freq: Three times a day (TID) | SUBCUTANEOUS | Status: DC
  Administered 2020-03-18: 5 [IU] via SUBCUTANEOUS
  Administered 2020-03-19 (×2): 3 [IU] via SUBCUTANEOUS
  Administered 2020-03-19: 2 [IU] via SUBCUTANEOUS
  Administered 2020-03-20 – 2020-03-21 (×3): 3 [IU] via SUBCUTANEOUS

## 2020-03-18 MED ORDER — SODIUM CHLORIDE 0.9 % IV SOLN: INTRAVENOUS | Status: AC

## 2020-03-18 MED ORDER — INSULIN GLARGINE 100 UNIT/ML ~~LOC~~ SOLN
20.0000 [IU] | Freq: Every day | SUBCUTANEOUS | Status: DC
  Filled 2020-03-18: qty 0.2

## 2020-03-18 MED ORDER — LORAZEPAM 2 MG/ML IJ SOLN
2.0000 mg | Freq: Once | INTRAMUSCULAR | Status: AC
  Administered 2020-03-18: 2 mg via INTRAVENOUS

## 2020-03-18 NOTE — Progress Notes (Signed)
NEUROLOGY CONSULTATION PROGRESS NOTE   Date of service: March 18, 2020 Patient Name: [name] MRN:  [id number] DOB:  [birth year]  Brief HPI  CLARON ROSENCRANS is a 74 y.o. 74 year old male presenting with focal status epilepticus in the setting of known R occipital glioblastoma. Resolved with 4mg  Ativan and 2g Keppra load. Keppra maintenance was increased to 1500mg  BID.  Interval Hx   No further seizures over the last 24 hours. He got Haldol and ativan for agitation.  Vitals   Vitals:   03/18/20 0300 03/18/20 0400 03/18/20 0500 03/18/20 0600  BP: (!) 146/96 (!) 143/99 122/79 (!) 150/97  Pulse: (!) 110 (!) 115 (!) 50 71  Resp: 20 (!) 22  16  Temp:  (!) 97.4 F (36.3 C)    TempSrc:  Axillary    SpO2: 97% 95% 98% 99%     There is no height or weight on file to calculate BMI.  Physical Exam   General: Laying comfortably in bed; in no acute distress.  HENT: Normal oropharynx and mucosa. Normal external appearance of ears and nose.  Neck: Supple, no pain or tenderness  CV: No JVD. No peripheral edema.  Pulmonary: Symmetric Chest rise. Normal respiratory effort.  Abdomen: Soft to touch, non-tender.  Ext: No cyanosis, edema, or deformity  Skin: No rash. Normal palpation of skin.   Musculoskeletal: Normal digits and nails by inspection. No clubbing.   Neurologic Examination  Mental status/Cognition: Somnolent but opens eyes to voice, oriented to self, place, month and year, poor attention Speech/language: Fluent, comprehension intact, object naming intact, repetition intact. Cranial nerves:   CN II Pupils equal and reactive to light, no VF deficits   CN III,IV,VI EOM intact, no gaze preference or deviation, no nystagmus   CN V normal sensation in V1, V2, and V3 segments bilaterally   CN VII no asymmetry, no nasolabial fold flattening   CN VIII normal hearing to speech   CN IX & X normal palatal elevation, no uvular deviation   CN XI    CN XII midline tongue  protrusion   Motor:  Muscle bulk: normal, tone normal  Left upper extremity is a 4 out of 5. Left lower extremity is a 4 out of 5. Right upper extremity is 4+ out of 5. Right lower extremity is a 4+ out of 5.  Sensation:  Light touch Intact throughout   Pin prick    Temperature    Vibration   Proprioception     Labs   Basic Metabolic Panel:  Lab Results  Component Value Date   NA 135 03/17/2020   K 4.0 03/17/2020   CO2 27 03/17/2020   GLUCOSE 154 (H) 03/17/2020   BUN 17 03/17/2020   CREATININE 0.70 03/17/2020   CALCIUM 8.6 (L) 03/17/2020   GFRNONAA >60 03/17/2020   GFRAA >60 01/02/2020   HbA1c:  Lab Results  Component Value Date   HGBA1C 6.9 (H) 10/08/2019   LDL:  Lab Results  Component Value Date   LDLCALC 67 05/04/2019   Urine Drug Screen:     Component Value Date/Time   LABOPIA NONE DETECTED 06/18/2015 1901   COCAINSCRNUR NONE DETECTED 06/18/2015 1901   LABBENZ NONE DETECTED 06/18/2015 1901   AMPHETMU NONE DETECTED 06/18/2015 1901   THCU NONE DETECTED 06/18/2015 1901   LABBARB NONE DETECTED 06/18/2015 1901    Alcohol Level     Component Value Date/Time   ETH <5 06/18/2015 1805   No results found for: PHENYTOIN, ZONISAMIDE, LAMOTRIGINE,  LEVETIRACETA No results found for: PHENYTOIN, PHENOBARB, VALPROATE, CBMZ  Imaging and Diagnostic studies  Results for orders placed during the hospital encounter of 03/16/20  CT Head Wo Contrast  Narrative CLINICAL DATA:  Initial evaluation for acute seizure, history of right occipital glioblastoma, status post surgical debulking and XRT with concurrent Temodar.  EXAM: CT HEAD WITHOUT CONTRAST  1. No significant interval change in appearance of patient's known glioblastoma centered at the right occipital lobe. Surrounding edema and regional mass effect with up to 4 mm of right-to-left shift relatively unchanged. No interval hemorrhage or other complication. 2. Otherwise stable and negative head CT. No  other acute intracranial finding.  REEG: This study showed evidence of epileptogenicity arising from right parieto-occipital region due to underlying mass. LPD are on the ictal-interictal continuum with high potential for seizures. There is also mild diffuse encephalopathy, nonspecific etiology. No definite  seizures were seen throughout the recording.  Impression   [name] is a 74 year old male presenting with focal status epilepticus in the setting of known glioblastoma. He is somnolent, but wakes up and follows commands, althou has poor attention and probably some delirium. Has some LUE and LLE weakness but that is chronic. No futher clinical events concerning for a seizure.  Recommendations  - continue Keppra 1500mg  BID - Continue home Decadron. - Neurology inpatient team will signoff. If patient has any more seizures or worsening mentation concerning for subclincal status, please let us know ______________________________________________________________________   Thank you for the opportunity to take part in the care of this patient. If you have any further questions, please contact the neurology consultation attending.  Signed,  [place] [facility] Pager Number [phone]

## 2020-03-18 NOTE — Plan of Care (Signed)

## 2020-03-18 NOTE — Progress Notes (Signed)
SLP Cancellation Note  Patient Details Name: [name] MRN: [id number] DOB: 1946/02/16   Cancelled treatment:       Reason Eval/Treat Not Completed: SLP screened chart and discussed with RN. Swallowing evaluation had been ordered but pt has since passed the [place] swallow screen with a diet of regular solids and thin liquids initiated. SLP will therefore defer swallowing evaluation for now but please reorder if pt is observed to have any difficulties.     Osie Bond., M.A. [facility] Pager [phone] Office [phone]  03/18/2020, 9:57 AM

## 2020-03-18 NOTE — Progress Notes (Signed)
Patient confused and persistently trying to remove equipment and collar; multiple attempts to calm and educate patient have failed because patient though alert and oriented to self consistently is disoriented to situation. MD notified, RN will continue to monitor.

## 2020-03-18 NOTE — Progress Notes (Signed)
[facility] PROGRESS NOTE    Progress Note  [name]  HAL:[id number] DOB: 02-Jun-1945 DOA: 03/16/2020 PCP: Maurice Small, MD     Brief Narrative:   [name] is an 74 y.o. 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  Assessment/Plan:   Recurrent seizures Was given Ativan in the ED and loaded with Keppra. Neurology was consulted continue  Keppra. Further management per neurology. Allow him a diet.  DKA/diabetes mellitus without complication ([place]): With an anion gap on admission was started on IV insulin IV fluids was given a single dose of long-acting insulin plus sliding scale currently on dexamethasone. Switch his dexamethasone to oral, will give him a carb modified diet start him on long-acting insulin plus sliding scale.  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  Acute confusional state: This morning he is awake alert and oriented x3 he does not remember the events of last night.  We will go ahead and discontinue restraints.  DVT prophylaxis: lovenxo Family Communication:none Status is: Inpatient  Remains inpatient appropriate because:Hemodynamically unstable   Dispo: The patient is from: Home              Anticipated d/c is to: Home              Anticipated d/c date is: 2 days              Patient currently is not medically stable to d/c.        Code Status:     Code Status Orders  (From admission, onward)         Start     Ordered   03/16/20 2342  Full code  Continuous        03/16/20 2341        Code Status History    Date Active Date Inactive Code Status Order ID Comments User Context   10/11/2019 1741 10/18/2019 1533 Full Code [id number]  Elizabeth Sauer Inpatient   10/11/2019 1741 10/11/2019 1741 Full  Code [id number]  Cathlyn Parsons, PA-C Inpatient   10/08/2019 2216 10/11/2019 1733 Full Code [id number]  Shela Leff, MD ED   07/04/2019 1808 07/07/2019 2156 Full Code [id number]  Ashok Pall, MD Inpatient   07/01/2019 2305 07/03/2019 1629 Full Code [id number]  Vianne Bulls, MD Inpatient   07/01/2019 2156 07/01/2019 2305 Full Code [id number]  Vianne Bulls, MD ED   06/18/2015 2220 06/22/2015 1619 Full Code [id number]  Theressa Millard, MD Inpatient   09/06/2014 1728 09/07/2014 1516 Full Code [id number]  Melina Schools, MD Inpatient   Advance Care Planning Activity        IV Access:    Peripheral IV   Procedures and diagnostic studies:   CT Head Wo Contrast  Result Date: 03/16/2020 CLINICAL DATA:  Initial evaluation for acute seizure, history of right occipital glioblastoma, status post surgical debulking and XRT with concurrent Temodar. EXAM: CT HEAD WITHOUT CONTRAST TECHNIQUE: Contiguous axial images were obtained from the base of the skull through the vertex without intravenous contrast. COMPARISON:  Prior brain MRI from 03/13/2020. FINDINGS: Brain: Postoperative changes from right occipital craniotomy for tumor debulking again seen. Residual masslike opacity within the underlying right occipital lobe again seen, grossly stable from prior MRI. Progressive calcification/mineralization at the overlying right occipital cortex as compared to most recent CT  from June. Probable extension across the splenium again noted, better appreciated on prior MRI. Surrounding hypodensity within the right temporal occipital region not significantly changed. Similar mass effect on the right lateral ventricle with up to 4 mm of right-to-left shift. No evidence for interval hemorrhage or other complication. Remainder the brain is otherwise stable and negative in appearance. No other acute large vessel territory infarct or hemorrhage. No extra-axial collection. Vascular: No hyperdense vessel. Scattered vascular  calcifications noted within the carotid siphons. Skull: Prior right craniotomy.  No acute finding. Sinuses/Orbits: Globes and orbital soft tissues within normal limits. Paranasal sinuses are largely clear. No significant mastoid effusion. Other: None. IMPRESSION: 1. No significant interval change in appearance of patient's known glioblastoma centered at the right occipital lobe. Surrounding edema and regional mass effect with up to 4 mm of right-to-left shift relatively unchanged. No interval hemorrhage or other complication. 2. Otherwise stable and negative head CT. No other acute intracranial finding. Electronically Signed   By: Jeannine Boga M.D.   On: 03/16/2020 22:49   DG Chest Port 1 View  Result Date: 03/16/2020 CLINICAL DATA:  Unresponsive. EXAM: PORTABLE CHEST 1 VIEW COMPARISON:  July 05, 2019 FINDINGS: The heart size is enlarged. The patient is status post prior median sternotomy. The lung volumes are low. There are trace bilateral pleural effusions. Bibasilar airspace disease is noted and favored to represent passive atelectasis. There is no pneumothorax. No acute displaced fracture. Aortic calcifications are noted. IMPRESSION: 1. Low lung volumes with trace bilateral pleural effusions and bibasilar airspace disease, favored to represent passive atelectasis. 2. Cardiomegaly. Electronically Signed   By: Constance Holster M.D.   On: 03/16/2020 21:28   EEG adult  Result Date: 03/17/2020 Lora Havens, MD     03/17/2020  2:23 PM Patient Name: [name] MRN: [id number] Epilepsy Attending: Lora Havens Referring Physician/Provider: Dr Roland Rack Date: 03/17/2020 Duration: 27.51 mins Patient history: 74 year old male presenting with focal status epilepticus in the setting of known glioblastoma. EEG to evaluate for seizure. Level of alertness: Awake, asleep AEDs during EEG study: LEV Technical aspects: This EEG study was done with scalp electrodes positioned according to  the 10-20 International system of electrode placement. Electrical activity was acquired at a sampling rate of 500Hz  and reviewed with a high frequency filter of 70Hz  and a low frequency filter of 1Hz . EEG data were recorded continuously and digitally stored. Description: No posterior dominant rhythm was seen. Sleep was characterized by vertex waves, sleep spindles (12 to 14 Hz), maximal frontocentral region.  EEG showed continuous generalized 3 to 6 Hz theta-delta slowing with overriding 15-18hz  beta activity. Sharp waves were seen in right parieto-occipital region, at times periodic at 0.5Hz , maximal P4. Hyperventilation and photic stimulation were not performed.   ABNORMALITY - Lateralized periodic discharges, right hemisphere, maximal P4  Sharp waves, right parieto-occipital region -Continuous slow, generalized IMPRESSION: This study showed evidence of epileptogenicity arising from right parieto-occipital region due to underlying mass. LPD are on the ictal-interictal continuum with high potential for seizures. There is also mild diffuse encephalopathy, nonspecific etiology. No definite  seizures were seen throughout the recording. [place] [facility]:    None.  Anti-Infectives:   none  Subjective:    [name] he relates he does not remember the events overnight, this morning he relates he asking if restraints can be removed  Objective:    Vitals:   03/18/20 0300 03/18/20 0400 03/18/20 0500 03/18/20 0600  BP: (!) 146/96 (!) 143/99 122/79 (!) 150/97  Pulse: (!) 110 (!) 115 (!) 50 71  Resp: 20 (!) 22  16  Temp:  (!) 97.4 F (36.3 C)    TempSrc:  Axillary    SpO2: 97% 95% 98% 99%   SpO2: 99 % O2 Flow Rate (L/min): 2 L/min   Intake/Output Summary (Last 24 hours) at 03/18/2020 0734 Last data filed at 03/18/2020 0600 Gross per 24 hour  Intake 905.04 ml  Output 550 ml  Net 355.04 ml   There were no vitals filed for this visit.  Exam: General exam:  In no acute distress, c-collar in place Respiratory system: Good air movement and clear to auscultation. Cardiovascular system: S1 & S2 heard, RRR. No JVD. Gastrointestinal system: Abdomen is nondistended, soft and nontender.  Central nervous system: Alert and oriented. No focal neurological deficits. Extremities: No pedal edema. Skin: No rashes, lesions or ulcers Psychiatry: Judgement and insight appear normal. Mood & affect appropriate.    Data Reviewed:    Labs: Basic Metabolic Panel: Recent Labs  Lab 03/16/20 2103 03/16/20 2103 03/16/20 2107 03/16/20 2107 03/16/20 2144 03/16/20 2144 03/17/20 0123 03/17/20 1741  NA 134*  --  131*  --  132*  --  135 135  K 4.8   < > 4.8   < > 4.8   < > 3.9 4.0  CL 96*  --  100  --   --   --   --  99  CO2 20*  --   --   --   --   --   --  27  GLUCOSE 495*  --  514*  --   --   --   --  154*  BUN 28*  --  32*  --   --   --   --  17  CREATININE 1.22  --  0.90  --   --   --   --  0.70  CALCIUM 9.0  --   --   --   --   --   --  8.6*  MG 1.9  --   --   --   --   --   --   --   PHOS 5.2*  --   --   --   --   --   --   --    < > = values in this interval not displayed.   GFR CrCl cannot be calculated (Unknown ideal weight.). Liver Function Tests: Recent Labs  Lab 03/16/20 2103  AST 25  ALT 30  ALKPHOS 70  BILITOT 1.5*  PROT 6.6  ALBUMIN 3.8   No results for input(s): LIPASE, AMYLASE in the last 168 hours. No results for input(s): AMMONIA in the last 168 hours. Coagulation profile Recent Labs  Lab 03/16/20 2103  INR 0.9   COVID-19 Labs  No results for input(s): DDIMER, FERRITIN, LDH, CRP in the last 72 hours.  Lab Results  Component Value Date   SARSCOV2NAA NEGATIVE 03/16/2020   [place] NEGATIVE 10/08/2019   [place] NEGATIVE 07/01/2019    CBC: Recent Labs  Lab 03/16/20 2103 03/16/20 2107 03/16/20 2144 03/17/20 0123 03/17/20 1741  WBC 7.5  --   --   --  2.2*  NEUTROABS 5.9  --   --   --   --   HGB 15.2  15.3 14.6 12.6* 14.2  HCT 45.8 45.0 43.0 37.0* 41.3  MCV 91.8  --   --   --  88.4  PLT 137*  --   --   --  84*   Cardiac Enzymes: No results for input(s): CKTOTAL, CKMB, CKMBINDEX, TROPONINI in the last 168 hours. BNP (last 3 results) No results for input(s): PROBNP in the last 8760 hours. CBG: Recent Labs  Lab 03/17/20 1403 03/17/20 1558 03/17/20 1958 03/17/20 2331 03/18/20 0334  GLUCAP 163* 176* 112* 101* 173*   D-Dimer: No results for input(s): DDIMER in the last 72 hours. Hgb A1c: No results for input(s): HGBA1C in the last 72 hours. Lipid Profile: No results for input(s): CHOL, HDL, LDLCALC, TRIG, CHOLHDL, LDLDIRECT in the last 72 hours. Thyroid function studies: No results for input(s): TSH, T4TOTAL, T3FREE, THYROIDAB in the last 72 hours.  Invalid input(s): FREET3 Anemia work up: No results for input(s): VITAMINB12, FOLATE, FERRITIN, TIBC, IRON, RETICCTPCT in the last 72 hours. Sepsis Labs: Recent Labs  Lab 03/16/20 2103 03/16/20 2104 03/16/20 2304 03/17/20 1741  WBC 7.5  --   --  2.2*  LATICACIDVEN  --  7.6* 3.5*  --    Microbiology Recent Results (from the past 240 hour(s))  Resp Panel by RT-PCR (Flu A&B, Covid) Nasopharyngeal Swab     Status: None   Collection Time: 03/16/20  9:00 PM   Specimen: Nasopharyngeal Swab; Nasopharyngeal(NP) swabs in vial transport medium  Result Value Ref Range Status   SARS Coronavirus 2 by RT PCR NEGATIVE NEGATIVE Final    Comment: (NOTE) SARS-CoV-2 target nucleic acids are NOT DETECTED.  The SARS-CoV-2 RNA is generally detectable in upper respiratory specimens during the acute phase of infection. The lowest concentration of SARS-CoV-2 viral copies this assay can detect is 138 copies/mL. A negative result does not preclude SARS-Cov-2 infection and should not be used as the sole basis for treatment or other patient management decisions. A negative result may occur with  improper specimen collection/handling, submission of  specimen other than nasopharyngeal swab, presence of viral mutation(s) within the areas targeted by this assay, and inadequate number of viral copies(<138 copies/mL). A negative result must be combined with clinical observations, patient history, and epidemiological information. The expected result is Negative.  Fact Sheet for Patients:  EntrepreneurPulse.com.au  Fact Sheet for Healthcare Providers:  IncredibleEmployment.be  This test is no t yet approved or cleared by the [place] FDA and  has been authorized for detection and/or diagnosis of SARS-CoV-2 by FDA under an Emergency Use Authorization (EUA). This EUA will remain  in effect (meaning this test can be used) for the duration of the COVID-19 declaration under Section 564(b)(1) of the Act, 21 U.S.C.section 360bbb-3(b)(1), unless the authorization is terminated  or revoked sooner.       Influenza A by PCR NEGATIVE NEGATIVE Final   Influenza B by PCR NEGATIVE NEGATIVE Final    Comment: (NOTE) The Xpert Xpress SARS-CoV-2/FLU/RSV plus assay is intended as an aid in the diagnosis of influenza from Nasopharyngeal swab specimens and should not be used as a sole basis for treatment. Nasal washings and aspirates are unacceptable for Xpert Xpress SARS-CoV-2/FLU/RSV testing.  Fact Sheet for Patients: EntrepreneurPulse.com.au  Fact Sheet for Healthcare Providers: IncredibleEmployment.be  This test is not yet approved or cleared by the [place] FDA and has been authorized for detection and/or diagnosis of SARS-CoV-2 by FDA under an Emergency Use Authorization (EUA). This EUA will remain in effect (meaning this test can be used) for the duration of the COVID-19 declaration under Section 564(b)(1) of the Act, 21 U.S.C. section 360bbb-3(b)(1), unless the authorization is terminated  or revoked.  Performed at [facility], [place] [address],  [place], [place] [address]   MRSA PCR Screening     Status: None   Collection Time: 03/17/20  2:15 AM   Specimen: Nasal Mucosa; Nasopharyngeal  Result Value Ref Range Status   MRSA by PCR NEGATIVE NEGATIVE Final    Comment:        The GeneXpert MRSA Assay (FDA approved for NASAL specimens only), is one component of a comprehensive MRSA colonization surveillance program. It is not intended to diagnose MRSA infection nor to guide or monitor treatment for MRSA infections. Performed at [facility], [place] [address] [place], [place] [address]      Medications:   . Chlorhexidine Gluconate Cloth  6 each Topical Daily  . dexamethasone (DECADRON) injection  4 mg Intravenous Q12H  . heparin  5,000 Units Subcutaneous Q8H  . insulin aspart  0-15 Units Subcutaneous Q4H  . metoprolol tartrate       Continuous Infusions: . dextrose 5% lactated ringers Stopped (03/17/20 1427)  . lactated ringers 125 mL/hr at 03/17/20 0007  . lactated ringers 125 mL/hr at 03/16/20 2310  . levETIRAcetam Stopped (03/17/20 2145)      LOS: 2 days   Charlynne Cousins  [facility]  03/18/2020, 7:34 AM

## 2020-03-18 NOTE — Telephone Encounter (Signed)
Received vm call from pt's wife stating pt is in the hospital, admitted Sat for seizure.  He is scheduled for appt tomorrow so she wants to know what to do. Message routed to Dr [name].

## 2020-03-18 NOTE — Progress Notes (Addendum)
eLink Physician-Brief Progress Note Patient Name: MAJID MCCRAVY DOB: 08-24-1945 MRN: [id number]   Date of Service  03/18/2020  HPI/Events of Note  Agitated delirium. Patient is pulling at lines and tubes.   eICU Interventions  Haldol 2-4mg  IV ordered.   ADDENDUM: Haldol ineffective. RN requests posey + soft bilateral wrist restraints. Entered these restraints as well as Ativan 2mg  IV + metoprolol 5mg  IV (he is in Afib w/ RVR with HR in 130-150 bpm range).  Intervention Category Minor Interventions: Agitation / anxiety - evaluation and management  Marily Lente [place]an [place] 03/18/2020, 4:04 AM

## 2020-03-19 ENCOUNTER — Inpatient Hospital Stay: Payer: Medicare Other | Admitting: Internal Medicine

## 2020-03-19 DIAGNOSIS — C719 Malignant neoplasm of brain, unspecified: Secondary | ICD-10-CM | POA: Diagnosis not present

## 2020-03-19 LAB — GLUCOSE, CAPILLARY
Glucose-Capillary: 196 mg/dL — ABNORMAL HIGH (ref 70–99)
Glucose-Capillary: 128 mg/dL — ABNORMAL HIGH (ref 70–99)
Glucose-Capillary: 190 mg/dL — ABNORMAL HIGH (ref 70–99)
Glucose-Capillary: 329 mg/dL — ABNORMAL HIGH (ref 70–99)

## 2020-03-19 LAB — TSH: TSH: 0.641 u[IU]/mL (ref 0.350–4.500)

## 2020-03-19 MED ORDER — METOPROLOL TARTRATE 25 MG PO TABS
25.0000 mg | ORAL_TABLET | Freq: Two times a day (BID) | ORAL | Status: DC
  Administered 2020-03-19 – 2020-03-21 (×5): 25 mg via ORAL
  Filled 2020-03-19 (×5): qty 1

## 2020-03-19 MED ORDER — INSULIN DETEMIR 100 UNIT/ML ~~LOC~~ SOLN
5.0000 [IU] | Freq: Every day | SUBCUTANEOUS | Status: DC
  Administered 2020-03-19 – 2020-03-21 (×3): 5 [IU] via SUBCUTANEOUS
  Filled 2020-03-19 (×3): qty 0.05

## 2020-03-19 MED ORDER — METOPROLOL TARTRATE 5 MG/5ML IV SOLN
5.0000 mg | Freq: Four times a day (QID) | INTRAVENOUS | Status: DC | PRN
  Administered 2020-03-19: 5 mg via INTRAVENOUS
  Filled 2020-03-19: qty 5

## 2020-03-19 NOTE — Plan of Care (Signed)

## 2020-03-19 NOTE — Progress Notes (Addendum)
Occupational Therapy Evaluation Patient Details Name: [name] MRN: [id number] DOB: 05-Feb-1946 Today's Date: 03/19/2020    History of Present Illness Patient is a 74 y/o male who fell at home secondary to seizure; admitted with status epilepticus in the setting of known GBM. Head CT-significant edema in right parietooccipital region. PMH includes prostate ca s/p radiation, CAd, chronic diastolic heart failure, DM2, essential HTN, CVA.   Clinical Impression   Pt seen with wife present for OT evaluation; wife confirms that prior to admission pt using RW for mobility, requiring intermittent A for ADL's and recently d/c from OPPT neuro prior to this admission. Currently pt presents with decreased activity tolerance, strength, balance and mild safety deficits leading to need for CGA-min A for transfers and ADL's in both standing and sitting. Pt with O2 on RA fluctuating from 88-93%, sustaining upper 80's with activity, improved immediately with donning of O2 via [place]. OT will continue to follow acutely with pt and wife in agreement for participation with post acute OT at time of d/c.     Follow Up Recommendations  Outpatient OT    Equipment Recommendations  None recommended by OT;Other (comment) (hes all necessary equip)    Recommendations for Other Services       Precautions / Restrictions Precautions Precautions: Fall Precaution Comments: seiures Restrictions Weight Bearing Restrictions: No      Mobility Bed Mobility Overal bed mobility: Needs Assistance Bed Mobility: Supine to Sit     Supine to sit: Min assist;HOB elevated     General bed mobility comments: reliance on bed rail for supine>sitting, and A for LE's to return to supine    Transfers Overall transfer level: Needs assistance Equipment used: Rolling walker (2 wheeled) Transfers: Sit to/from Stand Sit to Stand: Min assist;From elevated surface         General transfer comment: On initial trial STS with  min A, improved to min guard with elevated height o bed    Balance Overall balance assessment: Needs assistance;History of Falls Sitting-balance support: Feet supported;No upper extremity supported Sitting balance-Leahy Scale: Good     Standing balance support: During functional activity Standing balance-Leahy Scale: Poor Standing balance comment: Requires UE support in standing.                           ADL either performed or assessed with clinical judgement   ADL Overall ADL's : Needs assistance/impaired Eating/Feeding: Set up;Sitting   Grooming: Sitting;Set up   Upper Body Bathing: Set up;Sitting   Lower Body Bathing: Minimal assistance   Upper Body Dressing : Set up;Sitting   Lower Body Dressing: Minimal assistance;Sitting/lateral leans;Sit to/from stand;Cueing for safety   Toilet Transfer: RW;Minimal assistance   Toileting- Clothing Manipulation and Hygiene: Minimal assistance;Sit to/from stand;Cueing for safety       Functional mobility during ADLs: Minimal assistance;Rolling walker General ADL Comments: A for steadying and difficulty with initial transition to standing from sitting this date     Vision Baseline Vision/History: Wears glasses Wears Glasses: At all times Vision Assessment?: No apparent visual deficits     Perception     Praxis      Pertinent Vitals/Pain Pain Assessment: No/denies pain     Hand Dominance Right   Extremity/Trunk Assessment Upper Extremity Assessment Upper Extremity Assessment: Overall WFL for tasks assessed   Lower Extremity Assessment Lower Extremity Assessment: Generalized weakness (LLE weaker than RLE at baseline)   Cervical / Trunk Assessment Cervical / Trunk  Assessment: Normal   Communication Communication Communication: No difficulties   Cognition Arousal/Alertness: Awake/alert Behavior During Therapy: WFL for tasks assessed/performed Overall Cognitive Status: Impaired/Different from  baseline Area of Impairment: Memory;Following commands;Problem solving                     Memory: Decreased short-term memory Following Commands: Follows one step commands with increased time     Problem Solving: Slow processing;Requires verbal cues General Comments: HOH requiring repeated directions, difficulty determining potential deficits in processing   General Comments  on RW vitals at EOB fluctuating from 88-94% improved to >93% with 2L [place] donned    Exercises     Shoulder Instructions      Home Living Family/patient expects to be discharged to:: Private residence Living Arrangements: Spouse/significant other Available Help at Discharge: Family;Available 24 hours/day Type of Home: House Home Access: Stairs to enter [employer] of Steps: 2 Entrance Stairs-Rails: Right Home Layout: Two level;Bed/bath upstairs Alternate Level Stairs-Number of Steps: 13 vs stair lift Alternate Level Stairs-Rails: Right;Left;Can reach both Bathroom Shower/Tub: Walk-in Hydrologist: Handicapped height Bathroom Accessibility: Yes How Accessible: Accessible via walker Home Equipment: [place] - single point;Walker - 2 wheels;Shower seat;Grab bars - tub/shower;Toilet riser    Prior Functioning/Environment Level of Independence: Needs assistance  Gait / Transfers Assistance Needed: recently finished OP therapy and had been using RW recently for mobility  ADL's / Homemaking Assistance Needed: Assist with shower and getting dressed. Does not do any IADLs.            OT Problem List: Decreased strength;Decreased activity tolerance;Impaired balance (sitting and/or standing);Decreased cognition;Decreased safety awareness;Decreased knowledge of use of DME or AE;Decreased knowledge of precautions      OT Treatment/Interventions: Self-care/ADL training;Therapeutic exercise;Energy conservation;DME and/or AE instruction;Therapeutic activities;Cognitive  remediation/compensation;Balance training  Education provided to pt and wife on energy conservation techniques, safety training for functional transfers to and from standing from standard and elevated height.    OT Goals(Current goals can be found in the care plan section) Acute Rehab OT Goals Patient Stated Goal: to go home OT Goal Formulation: With patient Time For Goal Achievement: 04/02/20 Potential to Achieve Goals: Good ADL Goals Pt Will Perform Upper Body Dressing: with set-up;sitting Pt Will Perform Lower Body Dressing: with set-up;sit to/from stand;with adaptive equipment Pt Will Transfer to Toilet: with modified independence;bedside commode;ambulating;grab bars Pt Will Perform Toileting - Clothing Manipulation and hygiene: sit to/from stand;with modified independence Additional ADL Goal #1: pt will tolerate static standing for 3 minutes while compelting grooming tasks at sink with Sup and VSS  OT Frequency: Min 2X/week   Barriers to D/C:            Co-evaluation              AM-PAC OT "6 Clicks" Daily Activity     Outcome Measure Help from another person eating meals?: None Help from another person taking care of personal grooming?: A Little Help from another person toileting, which includes using toliet, bedpan, or urinal?: A Little Help from another person bathing (including washing, rinsing, drying)?: A Lot Help from another person to put on and taking off regular upper body clothing?: A Little Help from another person to put on and taking off regular lower body clothing?: A Little 6 Click Score: 18   End of Session Equipment Utilized During Treatment: Rolling walker  Activity Tolerance: Patient tolerated treatment well Patient left: in bed;with bed alarm set;with family/visitor present  OT Visit  Diagnosis: Unsteadiness on feet (R26.81)                Time: 5784-6962 OT Time Calculation (min): 32 min Charges:  OT General Charges $OT Visit: 1 Visit OT  Evaluation $OT Eval Low Complexity: 1 Low OT Treatments $Self Care/Home Management : 8-22 mins  Alicja Everitt OTR/L acute rehab services Office: [place] 03/19/2020, 3:14 PM

## 2020-03-19 NOTE — Progress Notes (Signed)
[facility] PROGRESS NOTE    Progress Note  [name]  ION:[id number] DOB: 1945/06/18 DOA: 03/16/2020 PCP: Maurice Small, MD     Brief Narrative:   [name] is an 74 y.o. 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  Assessment/Plan:   Recurrent seizures Was given Ativan in the ED and loaded with Keppra. Neurology was consulted continue  Keppra. No seizure events, awaiting PT eval.  DKA/diabetes mellitus without complication ([place]): With an anion gap on admission was started on IV insulin IV fluids was given a single dose of long-acting insulin plus sliding scale currently on dexamethasone Cont long acting insulin and SSI.Marland Kitchen  Glioblastoma with isocitrate dehydrogenase gene wildtype ([facility]) Repeated CT did not show any changes continue dexamethasone.  Acute confusional state: Resolved. Cont haldol IV PRN, melatonin at night.  SInus tach/Essential HTN: Start metoprolol low dose, at home he is on norvasc, will benefit more from metoprolol due to his HR. Denies pain, Hbg stable, check TSH.  DVT prophylaxis: lovenxo Family Communication:none Status is: Inpatient  Remains inpatient appropriate because:Hemodynamically unstable   Dispo: The patient is from: Home              Anticipated d/c is to: Home              Anticipated d/c date is: 2 days              Patient currently is not medically stable to d/c.        Code Status:     Code Status Orders  (From admission, onward)         Start     Ordered   03/16/20 2342  Full code  Continuous        03/16/20 2341        Code Status History    Date Active Date Inactive Code Status Order ID Comments User Context   10/11/2019 1741 10/18/2019 1533 Full Code [id number]  Elizabeth Sauer Inpatient   10/11/2019 1741 10/11/2019 1741 Full Code [id number]   Cathlyn Parsons, PA-C Inpatient   10/08/2019 2216 10/11/2019 1733 Full Code [id number]  Shela Leff, MD ED   07/04/2019 1808 07/07/2019 2156 Full Code [id number]  Ashok Pall, MD Inpatient   07/01/2019 2305 07/03/2019 1629 Full Code [id number]  Vianne Bulls, MD Inpatient   07/01/2019 2156 07/01/2019 2305 Full Code [id number]  Vianne Bulls, MD ED   06/18/2015 2220 06/22/2015 1619 Full Code [id number]  Theressa Millard, MD Inpatient   09/06/2014 1728 09/07/2014 1516 Full Code [id number]  Melina Schools, MD Inpatient   Advance Care Planning Activity        IV Access:    Peripheral IV   Procedures and diagnostic studies:   EEG adult  Result Date: 03/17/2020 Lora Havens, MD     03/17/2020  2:23 PM Patient Name: [name] MRN: [id number] Epilepsy Attending: Lora Havens Referring Physician/Provider: Dr Roland Rack Date: 03/17/2020 Duration: 27.51 mins Patient history: 74 year old male presenting with focal status epilepticus in the setting of known glioblastoma. EEG to evaluate for seizure. Level of alertness: Awake, asleep AEDs during EEG study: LEV Technical aspects: This EEG study was done with scalp electrodes positioned according to the 10-20 International system of electrode placement. Electrical activity was acquired at a sampling rate of 500Hz  and reviewed with a high frequency filter of  70Hz  and a low frequency filter of 1Hz . EEG data were recorded continuously and digitally stored. Description: No posterior dominant rhythm was seen. Sleep was characterized by vertex waves, sleep spindles (12 to 14 Hz), maximal frontocentral region.  EEG showed continuous generalized 3 to 6 Hz theta-delta slowing with overriding 15-18hz  beta activity. Sharp waves were seen in right parieto-occipital region, at times periodic at 0.5Hz , maximal P4. Hyperventilation and photic stimulation were not performed.   ABNORMALITY - Lateralized periodic discharges, right hemisphere, maximal  P4  Sharp waves, right parieto-occipital region -Continuous slow, generalized IMPRESSION: This study showed evidence of epileptogenicity arising from right parieto-occipital region due to underlying mass. LPD are on the ictal-interictal continuum with high potential for seizures. There is also mild diffuse encephalopathy, nonspecific etiology. No definite  seizures were seen throughout the recording. [place] [facility]:    None.  Anti-Infectives:   none  Subjective:    [name] no complains  Objective:    Vitals:   03/18/20 1600 03/18/20 1700 03/18/20 1712 03/19/20 0748  BP: (!) 141/82 135/89 135/89 (!) 160/87  Pulse: (!) 104  (!) 109 95  Resp: 15 11 16  (!) 21  Temp:    98.4 F (36.9 C)  TempSrc:    Oral  SpO2: 99%  96% 92%   SpO2: 92 % O2 Flow Rate (L/min): 2 L/min   Intake/Output Summary (Last 24 hours) at 03/19/2020 0750 Last data filed at 03/19/2020 6269 Gross per 24 hour  Intake 911.17 ml  Output 1250 ml  Net -338.83 ml   There were no vitals filed for this visit.  Exam: General exam: In no acute distress, c-collar in place Respiratory system: Good air movement and clear to auscultation. Cardiovascular system: S1 & S2 heard, RRR. No JVD. Gastrointestinal system: Abdomen is nondistended, soft and nontender.  Central nervous system: Alert and oriented. No focal neurological deficits. Extremities: No pedal edema. Skin: No rashes, lesions or ulcers Psychiatry: Judgement and insight appear normal. Mood & affect appropriate.    Data Reviewed:    Labs: Basic Metabolic Panel: Recent Labs  Lab 03/16/20 2103 03/16/20 2103 03/16/20 2107 03/16/20 2107 03/16/20 2144 03/16/20 2144 03/17/20 0123 03/17/20 1741  NA 134*  --  131*  --  132*  --  135 135  K 4.8   < > 4.8   < > 4.8   < > 3.9 4.0  CL 96*  --  100  --   --   --   --  99  CO2 20*  --   --   --   --   --   --  27  GLUCOSE 495*  --  514*  --   --   --   --  154*    BUN 28*  --  32*  --   --   --   --  17  CREATININE 1.22  --  0.90  --   --   --   --  0.70  CALCIUM 9.0  --   --   --   --   --   --  8.6*  MG 1.9  --   --   --   --   --   --   --   PHOS 5.2*  --   --   --   --   --   --   --    < > = values in this interval  not displayed.   GFR CrCl cannot be calculated (Unknown ideal weight.). Liver Function Tests: Recent Labs  Lab 03/16/20 2103  AST 25  ALT 30  ALKPHOS 70  BILITOT 1.5*  PROT 6.6  ALBUMIN 3.8   No results for input(s): LIPASE, AMYLASE in the last 168 hours. No results for input(s): AMMONIA in the last 168 hours. Coagulation profile Recent Labs  Lab 03/16/20 2103  INR 0.9   COVID-19 Labs  No results for input(s): DDIMER, FERRITIN, LDH, CRP in the last 72 hours.  Lab Results  Component Value Date   SARSCOV2NAA NEGATIVE 03/16/2020   [place] NEGATIVE 10/08/2019   [place] NEGATIVE 07/01/2019    CBC: Recent Labs  Lab 03/16/20 2103 03/16/20 2107 03/16/20 2144 03/17/20 0123 03/17/20 1741  WBC 7.5  --   --   --  2.2*  NEUTROABS 5.9  --   --   --   --   HGB 15.2 15.3 14.6 12.6* 14.2  HCT 45.8 45.0 43.0 37.0* 41.3  MCV 91.8  --   --   --  88.4  PLT 137*  --   --   --  84*   Cardiac Enzymes: No results for input(s): CKTOTAL, CKMB, CKMBINDEX, TROPONINI in the last 168 hours. BNP (last 3 results) No results for input(s): PROBNP in the last 8760 hours. CBG: Recent Labs  Lab 03/18/20 0749 03/18/20 1230 03/18/20 1534 03/18/20 2110 03/19/20 0619  GLUCAP 88 107* 217* 191* 196*   D-Dimer: No results for input(s): DDIMER in the last 72 hours. Hgb A1c: Recent Labs    03/18/20 1016  HGBA1C 8.2*   Lipid Profile: No results for input(s): CHOL, HDL, LDLCALC, TRIG, CHOLHDL, LDLDIRECT in the last 72 hours. Thyroid function studies: No results for input(s): TSH, T4TOTAL, T3FREE, THYROIDAB in the last 72 hours.  Invalid input(s): FREET3 Anemia work up: No results for input(s): VITAMINB12, FOLATE,  FERRITIN, TIBC, IRON, RETICCTPCT in the last 72 hours. Sepsis Labs: Recent Labs  Lab 03/16/20 2103 03/16/20 2104 03/16/20 2304 03/17/20 1741  WBC 7.5  --   --  2.2*  LATICACIDVEN  --  7.6* 3.5*  --    Microbiology Recent Results (from the past 240 hour(s))  Resp Panel by RT-PCR (Flu A&B, Covid) Nasopharyngeal Swab     Status: None   Collection Time: 03/16/20  9:00 PM   Specimen: Nasopharyngeal Swab; Nasopharyngeal(NP) swabs in vial transport medium  Result Value Ref Range Status   SARS Coronavirus 2 by RT PCR NEGATIVE NEGATIVE Final    Comment: (NOTE) SARS-CoV-2 target nucleic acids are NOT DETECTED.  The SARS-CoV-2 RNA is generally detectable in upper respiratory specimens during the acute phase of infection. The lowest concentration of SARS-CoV-2 viral copies this assay can detect is 138 copies/mL. A negative result does not preclude SARS-Cov-2 infection and should not be used as the sole basis for treatment or other patient management decisions. A negative result may occur with  improper specimen collection/handling, submission of specimen other than nasopharyngeal swab, presence of viral mutation(s) within the areas targeted by this assay, and inadequate number of viral copies(<138 copies/mL). A negative result must be combined with clinical observations, patient history, and epidemiological information. The expected result is Negative.  Fact Sheet for Patients:  EntrepreneurPulse.com.au  Fact Sheet for Healthcare Providers:  IncredibleEmployment.be  This test is no t yet approved or cleared by the [place] FDA and  has been authorized for detection and/or diagnosis of SARS-CoV-2 by FDA under an Emergency Use Authorization (  EUA). This EUA will remain  in effect (meaning this test can be used) for the duration of the COVID-19 declaration under Section 564(b)(1) of the Act, 21 U.S.C.section 360bbb-3(b)(1), unless the authorization  is terminated  or revoked sooner.       Influenza A by PCR NEGATIVE NEGATIVE Final   Influenza B by PCR NEGATIVE NEGATIVE Final    Comment: (NOTE) The Xpert Xpress SARS-CoV-2/FLU/RSV plus assay is intended as an aid in the diagnosis of influenza from Nasopharyngeal swab specimens and should not be used as a sole basis for treatment. Nasal washings and aspirates are unacceptable for Xpert Xpress SARS-CoV-2/FLU/RSV testing.  Fact Sheet for Patients: EntrepreneurPulse.com.au  Fact Sheet for Healthcare Providers: IncredibleEmployment.be  This test is not yet approved or cleared by the [place] FDA and has been authorized for detection and/or diagnosis of SARS-CoV-2 by FDA under an Emergency Use Authorization (EUA). This EUA will remain in effect (meaning this test can be used) for the duration of the COVID-19 declaration under Section 564(b)(1) of the Act, 21 U.S.C. section 360bbb-3(b)(1), unless the authorization is terminated or revoked.  Performed at [facility], [place] [address], [place], [place] [address]   MRSA PCR Screening     Status: None   Collection Time: 03/17/20  2:15 AM   Specimen: Nasal Mucosa; Nasopharyngeal  Result Value Ref Range Status   MRSA by PCR NEGATIVE NEGATIVE Final    Comment:        The GeneXpert MRSA Assay (FDA approved for NASAL specimens only), is one component of a comprehensive MRSA colonization surveillance program. It is not intended to diagnose MRSA infection nor to guide or monitor treatment for MRSA infections. Performed at [facility], [place] [address] [place], [place] [address]      Medications:   . Chlorhexidine Gluconate Cloth  6 each Topical Daily  . dexamethasone (DECADRON) injection  4 mg Intravenous Q12H  . heparin  5,000 Units Subcutaneous Q8H  . insulin aspart  0-15 Units Subcutaneous TID WC  . insulin aspart  0-5 Units Subcutaneous QHS  . insulin aspart  4 Units  Subcutaneous TID WC  . melatonin  3 mg Oral QHS   Continuous Infusions: . levETIRAcetam 1,500 mg (03/18/20 2129)      LOS: 3 days   Charlynne Cousins  [facility]  03/19/2020, 7:50 AM

## 2020-03-19 NOTE — Evaluation (Signed)
Physical Therapy Evaluation Patient Details Name: [name] MRN: [id number] DOB: April 25, 1945 Today's Date: 03/19/2020   History of Present Illness  Patient is a 74 y/o male who fell at home secondary to seizure; admitted with status epilepticus in the setting of known GBM. Head CT-significant edema in right parietooccipital region. PMH includes prostate ca s/p radiation, CAd, chronic diastolic heart failure, DM2, essential HTN, CVA.  Clinical Impression  Patient presents with generalized weakness, LLE>RLE, impaired balance, decreased activity tolerance, dyspnea on exertion and impaired mobility s/p above. Pt lives at home with his wife and needs assist with ADLs at baseline, using RW for ambulation. Reports no falls in last 6 months, not counting one leading to admission due to seizure. Today, pt tolerated transfers and gait training with Min guard-Min A for balance/safety with use of RW for support. Sp02 ranged from 86-90% on RA during activity. Will follow acutely to maximize independence and mobility prior to return home.    Follow Up Recommendations Outpatient PT;Supervision for mobility/OOB (neuro OPPT)    Equipment Recommendations  None recommended by PT    Recommendations for Other Services       Precautions / Restrictions Precautions Precautions: Fall Restrictions Weight Bearing Restrictions: No      Mobility  Bed Mobility Overal bed mobility: Needs Assistance Bed Mobility: Supine to Sit     Supine to sit: Min assist;HOB elevated     General bed mobility comments: Assist with LLE to get to EOB, use of rail.    Transfers Overall transfer level: Needs assistance Equipment used: Rolling walker (2 wheeled) Transfers: Sit to/from Stand Sit to Stand: From elevated surface;Min guard         General transfer comment: Min guard to rise from EOB with good demo of hand placement, transferred to chair post ambulation.  Ambulation/Gait Ambulation/Gait assistance:  Min guard Gait Distance (Feet): 75 Feet Assistive device: Rolling walker (2 wheeled) Gait Pattern/deviations: Step-through pattern;Decreased stride length Gait velocity: decreased   General Gait Details: Slow, mildly unsteady gait with 2/4 DOE. Sp02 ranged from 86-90% on RA.  Stairs            Wheelchair Mobility    Modified Rankin (Stroke Patients Only)       Balance Overall balance assessment: Needs assistance;History of Falls Sitting-balance support: Feet supported;No upper extremity supported Sitting balance-Leahy Scale: Good     Standing balance support: During functional activity Standing balance-Leahy Scale: Poor Standing balance comment: Requires UE support in standing.                             Pertinent Vitals/Pain Pain Assessment: No/denies pain    Home Living Family/patient expects to be discharged to:: Private residence Living Arrangements: Spouse/significant other Available Help at Discharge: Family;Available 24 hours/day Type of Home: House Home Access: Stairs to enter Entrance Stairs-Rails: Right Entrance Stairs-Number of Steps: 2 Home Layout: Two level;Bed/bath upstairs Home Equipment: Cane - single point;Walker - 2 wheels;Shower seat;Grab bars - tub/shower      Prior Function Level of Independence: Needs assistance   Gait / Transfers Assistance Needed: Finished OPPT in October, some difficulty getting up from low surfaces. Uses RW for ambulation.  ADL's / Homemaking Assistance Needed: ASsist with shower and getting dressed. Does not do any IADLs.        Hand Dominance   Dominant Hand: Right    Extremity/Trunk Assessment   Upper Extremity Assessment Upper Extremity Assessment: Defer to OT  evaluation    Lower Extremity Assessment Lower Extremity Assessment: Generalized weakness (LLE weaker than RLE at baseline)       Communication   Communication: No difficulties  Cognition Arousal/Alertness: Awake/alert Behavior  During Therapy: WFL for tasks assessed/performed Overall Cognitive Status: Impaired/Different from baseline Area of Impairment: Memory;Following commands;Problem solving                     Memory: Decreased short-term memory Following Commands: Follows one step commands with increased time     Problem Solving: Slow processing;Requires verbal cues General Comments: Requires repetition at times to follow commands, maybe HOH?      General Comments General comments (skin integrity, edema, etc.): Wife present during session.    Exercises     Assessment/Plan    PT Assessment Patient needs continued PT services  PT Problem List Decreased strength;Decreased mobility;Decreased activity tolerance;Cardiopulmonary status limiting activity;Decreased balance       PT Treatment Interventions Therapeutic activities;Gait training;Therapeutic exercise;Patient/family education;Balance training;Functional mobility training;Neuromuscular re-education;Stair training    PT Goals (Current goals can be found in the Care Plan section)  Acute Rehab PT Goals Patient Stated Goal: to go home PT Goal Formulation: With patient Time For Goal Achievement: 04/02/20 Potential to Achieve Goals: Good    Frequency Min 3X/week   Barriers to discharge        Co-evaluation               AM-PAC PT "6 Clicks" Mobility  Outcome Measure Help needed turning from your back to your side while in a flat bed without using bedrails?: None Help needed moving from lying on your back to sitting on the side of a flat bed without using bedrails?: A Little Help needed moving to and from a bed to a chair (including a wheelchair)?: A Little Help needed standing up from a chair using your arms (e.g., wheelchair or bedside chair)?: A Little Help needed to walk in hospital room?: A Little Help needed climbing 3-5 steps with a railing? : A Little 6 Click Score: 19    End of Session Equipment Utilized During  Treatment: Gait belt Activity Tolerance: Treatment limited secondary to medical complications (Comment);Patient limited by fatigue (drop in Sp02) Patient left: in chair;with call bell/phone within reach;with chair alarm set;with family/visitor present Nurse Communication: Mobility status PT Visit Diagnosis: Muscle weakness (generalized) (M62.81);Difficulty in walking, not elsewhere classified (R26.2)    Time: 7209-4709 PT Time Calculation (min) (ACUTE ONLY): 29 min   Charges:   PT Evaluation $PT Eval Moderate Complexity: 1 Mod PT Treatments $Gait Training: 8-22 mins        Marisa Severin, PT, DPT Acute Rehabilitation Services Pager [phone] Office [place] 03/19/2020, 12:03 PM

## 2020-03-20 DIAGNOSIS — C719 Malignant neoplasm of brain, unspecified: Secondary | ICD-10-CM

## 2020-03-20 LAB — GLUCOSE, CAPILLARY
Glucose-Capillary: 148 mg/dL — ABNORMAL HIGH (ref 70–99)
Glucose-Capillary: 119 mg/dL — ABNORMAL HIGH (ref 70–99)
Glucose-Capillary: 160 mg/dL — ABNORMAL HIGH (ref 70–99)
Glucose-Capillary: 152 mg/dL — ABNORMAL HIGH (ref 70–99)
Glucose-Capillary: 167 mg/dL — ABNORMAL HIGH (ref 70–99)

## 2020-03-20 NOTE — Progress Notes (Signed)
PROGRESS NOTE    [name]   YSA:[id number]  DOB: June 04, 1945  DOA: 03/16/2020     4  PCP: Maurice Small, MD  CC: seizure  Hospital Course: [name] is an 74 y.o. 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  His home dose of Keppra was increased on admission.  He was also continued on Decadron.   Interval History:  No events overnight.  Wife bedside this morning.  She states that she spoke with his oncologist and that he would be coming by prior to discharge. Otherwise, patient sitting up in chair bedside comfortably when seen this morning.  Old records reviewed in assessment of this patient  ROS: Constitutional: negative, Respiratory: negative for cough, Cardiovascular: negative for chest pain and Gastrointestinal: negative for abdominal pain  Assessment & Plan: Recurrent seizures Was given Ativan in the ED and loaded with Keppra. 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  Wife states that he was on 6 mg at home.  She states that she spoke with oncology who was planning to see patient prior to d/c; consult placed and added oncology to treatment team - if unable to see patient prior to tomorrow, he can follow up outpatient   Acute metabolic encephalopathy Sundowning Resolved. Cont haldol IV PRN, melatonin at night.  Sinus  tach/Essential HTN: Start metoprolol low dose, at home he is on norvasc, will benefit more from metoprolol due to his HR. Denies pain, Hbg stable, check TSH.  Antimicrobials: None  DVT prophylaxis: HSQ Code Status: Full Family Communication: Wife bedside Disposition Plan: Status is: Inpatient  Remains inpatient appropriate because:Unsafe d/c plan and Inpatient level of care appropriate due to severity of illness   Dispo: The patient is from: Home              Anticipated d/c is to: Home              Anticipated d/c date is: 1 day              Patient currently is not medically stable to d/c.       Objective: Blood pressure (!) 157/95, pulse 78, temperature 98.7 F (37.1 C), temperature source Oral, resp. rate 20, weight 93.3 kg, SpO2 92 %.  Examination: General appearance: alert, cooperative and no distress Head: Normocephalic, without obvious abnormality, atraumatic Eyes: EOMI Lungs: clear to auscultation bilaterally Heart: regular rate and rhythm and S1, S2 normal Abdomen: normal findings: bowel sounds normal and soft, non-tender Extremities: No edema Skin: mobility and turgor normal Neurologic: Grossly normal  Consultants:   Neurology  Procedures:   None  Data Reviewed: I have personally reviewed following labs and imaging studies Results for orders placed or performed during the hospital encounter of 03/16/20 (from the past 24 hour(s))  Glucose, capillary     Status: Abnormal   Collection Time: 03/19/20  4:21 PM  Result Value Ref Range   Glucose-Capillary 190 (  H) 70 - 99 mg/dL   Comment 1 Notify RN    Comment 2 Document in Chart   Glucose, capillary     Status: Abnormal   Collection Time: 03/19/20  9:25 PM  Result Value Ref Range   Glucose-Capillary 329 (H) 70 - 99 mg/dL   Comment 1 Notify RN    Comment 2 Document in Chart   Glucose, capillary     Status: Abnormal   Collection Time: 03/20/20  5:34 AM  Result Value Ref Range   Glucose-Capillary 148  (H) 70 - 99 mg/dL   Comment 1 Notify RN    Comment 2 Document in Chart   Glucose, capillary     Status: Abnormal   Collection Time: 03/20/20  8:01 AM  Result Value Ref Range   Glucose-Capillary 119 (H) 70 - 99 mg/dL  Glucose, capillary     Status: Abnormal   Collection Time: 03/20/20 12:12 PM  Result Value Ref Range   Glucose-Capillary 160 (H) 70 - 99 mg/dL    Recent Results (from the past 240 hour(s))  Resp Panel by RT-PCR (Flu A&B, Covid) Nasopharyngeal Swab     Status: None   Collection Time: 03/16/20  9:00 PM   Specimen: Nasopharyngeal Swab; Nasopharyngeal(NP) swabs in vial transport medium  Result Value Ref Range Status   SARS Coronavirus 2 by RT PCR NEGATIVE NEGATIVE Final    Comment: (NOTE) SARS-CoV-2 target nucleic acids are NOT DETECTED.  The SARS-CoV-2 RNA is generally detectable in upper respiratory specimens during the acute phase of infection. The lowest concentration of SARS-CoV-2 viral copies this assay can detect is 138 copies/mL. A negative result does not preclude SARS-Cov-2 infection and should not be used as the sole basis for treatment or other patient management decisions. A negative result may occur with  improper specimen collection/handling, submission of specimen other than nasopharyngeal swab, presence of viral mutation(s) within the areas targeted by this assay, and inadequate number of viral copies(<138 copies/mL). A negative result must be combined with clinical observations, patient history, and epidemiological information. The expected result is Negative.  Fact Sheet for Patients:  EntrepreneurPulse.com.au  Fact Sheet for Healthcare Providers:  IncredibleEmployment.be  This test is no t yet approved or cleared by the [place] FDA and  has been authorized for detection and/or diagnosis of SARS-CoV-2 by FDA under an Emergency Use Authorization (EUA). This EUA will remain  in effect (meaning this test can  be used) for the duration of the COVID-19 declaration under Section 564(b)(1) of the Act, 21 U.S.C.section 360bbb-3(b)(1), unless the authorization is terminated  or revoked sooner.       Influenza A by PCR NEGATIVE NEGATIVE Final   Influenza B by PCR NEGATIVE NEGATIVE Final    Comment: (NOTE) The Xpert Xpress SARS-CoV-2/FLU/RSV plus assay is intended as an aid in the diagnosis of influenza from Nasopharyngeal swab specimens and should not be used as a sole basis for treatment. Nasal washings and aspirates are unacceptable for Xpert Xpress SARS-CoV-2/FLU/RSV testing.  Fact Sheet for Patients: EntrepreneurPulse.com.au  Fact Sheet for Healthcare Providers: IncredibleEmployment.be  This test is not yet approved or cleared by the [place] FDA and has been authorized for detection and/or diagnosis of SARS-CoV-2 by FDA under an Emergency Use Authorization (EUA). This EUA will remain in effect (meaning this test can be used) for the duration of the COVID-19 declaration under Section 564(b)(1) of the Act, 21 U.S.C. section 360bbb-3(b)(1), unless the authorization is terminated or revoked.  Performed at [facility]  Hospital Lab, [place] [address] [place], [place] [address]   MRSA PCR Screening     Status: None   Collection Time: 03/17/20  2:15 AM   Specimen: Nasal Mucosa; Nasopharyngeal  Result Value Ref Range Status   MRSA by PCR NEGATIVE NEGATIVE Final    Comment:        The GeneXpert MRSA Assay (FDA approved for NASAL specimens only), is one component of a comprehensive MRSA colonization surveillance program. It is not intended to diagnose MRSA infection nor to guide or monitor treatment for MRSA infections. Performed at [facility], [place] [address], [place], [place] [address]      Radiology Studies: No results found. CT Head Wo Contrast  Final Result    DG Chest Port 1 View  Final Result      Scheduled Meds: .  Chlorhexidine Gluconate Cloth  6 each Topical Daily  . dexamethasone (DECADRON) injection  4 mg Intravenous Q12H  . heparin  5,000 Units Subcutaneous Q8H  . insulin aspart  0-15 Units Subcutaneous TID WC  . insulin aspart  0-5 Units Subcutaneous QHS  . insulin detemir  5 Units Subcutaneous Daily  . melatonin  3 mg Oral QHS  . metoprolol tartrate  25 mg Oral BID   PRN Meds: dextrose, haloperidol lactate, LORazepam, metoprolol tartrate Continuous Infusions: . levETIRAcetam 1,500 mg (03/20/20 0956)     LOS: 4 days  Time spent: Greater than 50% of the 35 minute visit was spent in counseling/coordination of care for the patient as laid out in the A&P.   Dwyane Dee, MD [facility] 03/20/2020, 3:47 PM

## 2020-03-20 NOTE — Consult Note (Signed)
   [facility] CM Inpatient Consult   03/20/2020  [name] February 06, 1946 [id number]   [facility] [ACO] Patient: Medicare NextGen   Patient screened for high risk score for unplanned readmission and 3 hospitalizations in the past 6 months.  Reviewed to check if potential [facility] Management service needs.  Review of patient's medical record reveals patient is still recommended for Outpatient Rehabilitation for post hospital. Primary Care Provider is Maurice Small, MD this provider is listed to provide the transition of care [TOC] for post hospital follow up.   Plan:  Continue to follow progress and disposition to assess for post hospital care management needs.    For questions contact:   Natividad Brood, RN BSN [facility] Liaison  [phone] business mobile phone Toll free office [phone]  Fax number: [phone] [email or web address]

## 2020-03-20 NOTE — Progress Notes (Signed)
Inpatient Diabetes Program Recommendations  AACE/ADA: New Consensus Statement on Inpatient Glycemic Control (2015)  Target Ranges:  Prepandial:   less than 140 mg/dL      Peak postprandial:   less than 180 mg/dL (1-2 hours)      Critically ill patients:  140 - 180 mg/dL   Lab Results  Component Value Date   GLUCAP 160 (H) 03/20/2020   HGBA1C 8.2 (H) 03/18/2020    Review of Glycemic Control Results for SAVAUGHN, KARWOWSKI" (MRN [id number]) as of 03/20/2020 15:00  Ref. Range 03/19/2020 16:21 03/19/2020 21:25 03/20/2020 05:34 03/20/2020 08:01 03/20/2020 12:12  Glucose-Capillary Latest Ref Range: 70 - 99 mg/dL 190 (H) 329 (H) 148 (H) 119 (H) 160 (H)   Diabetes history:  DM2 Outpatient Diabetes medications:  None Current orders for Inpatient glycemic control:  Levemir 5 units daily Novolog 0-15 units tid with meals Novolog 0-5 units qhs Decadron 4 mg Q12H  Note:  Spoke with patient and wife briefly to verify no diabetes medications at home.  He was on insulin and Metformin when he was first diagnosed with diabetes.  No longer takes either.    Will continue to follow while inpatient.  Thank you, Reche Dixon, RN, BSN Diabetes Coordinator Inpatient Diabetes Program [phone] (team pager from 8a-5p)

## 2020-03-20 NOTE — TOC Initial Note (Signed)
Transition of Care [facility]) - Initial/Assessment Note    Patient Details  Name: [name] MRN: [id number] Date of Birth: February 02, 1946  Transition of Care [facility]) CM/SW Contact:    Pollie Friar, RN Phone Number: 03/20/2020, 8:23 AM  Clinical Narrative:                 Pt lives with spouse and has needed DME. Recommendation are for outpatient therapy. Pt states he recently finished outpatient therapy at the [facility] and would like to attend there again. Orders in Epic and information on the AVS. Pt has transport home when medically ready.   Expected Discharge Plan: OP Rehab Barriers to Discharge: Continued Medical Work up   Patient Goals and CMS Choice   CMS Medicare.gov Compare Post Acute Care list provided to:: Patient Choice offered to / list presented to : Patient  Expected Discharge Plan and Services Expected Discharge Plan: OP Rehab   Discharge Planning Services: CM Consult   Living arrangements for the past 2 months: Single Family Home                                      Prior Living Arrangements/Services Living arrangements for the past 2 months: Single Family Home Lives with:: Spouse Patient language and need for interpreter reviewed:: Yes Do you feel safe going back to the place where you live?: Yes      Need for Family Participation in Patient Care: Yes (Comment) Care giver support system in place?: Yes (comment) Current home services: DME (stair lift/ walker/ cane/ shower seat/ bars in shower) Criminal Activity/Legal Involvement Pertinent to Current Situation/Hospitalization: No - Comment as needed  Activities of Daily Living      Permission Sought/Granted                  Emotional Assessment Appearance:: Appears stated age Attitude/Demeanor/Rapport: Engaged Affect (typically observed): Accepting Orientation: : Oriented to Self, Oriented to Place, Oriented to  Time, Oriented to Situation   Psych Involvement: No  (comment)  Admission diagnosis:  Status epilepticus ([place]) [G40.901] Seizure ([place]) [R56.9] Glioblastoma ([place]) [C71.9] Diabetic ketoacidosis with coma associated with type 2 diabetes mellitus ([place]) [E11.11] Patient Active Problem List   Diagnosis Date Noted  . Seizure ([place]) 03/16/2020  . Glioblastoma multiforme ([place]) 10/11/2019  . Dyslipidemia   . Seizure prophylaxis   . Chronic diastolic congestive heart failure ([place])   . Diabetic peripheral neuropathy ([place])   . Palliative care by specialist   . Weakness 10/08/2019  . Vasogenic edema ([place]) 10/08/2019  . Gross hematuria 10/08/2019  . DNR (do not resuscitate) discussion 07/13/2019  . S/P craniotomy 07/04/2019  . Glioblastoma with isocitrate dehydrogenase gene wildtype ([place]) 07/04/2019  . Respiratory failure ([place])   . Encephalopathy acute   . Brain mass 07/01/2019  . Fever 07/01/2019  . Atherosclerosis of native artery of both lower extremities with intermittent claudication ([place]) 08/23/2018  . Coronary artery disease without angina pectoris 08/23/2018  . Dyspnea on exertion 08/23/2018  . Biochemically recurrent malignant neoplasm of prostate ([place]) 08/16/2018  . S/P carotid endarterectomy 08/20/2015  . Essential hypertension 08/20/2015  . Type 2 diabetes mellitus with circulatory disorder ([place]) 08/20/2015  . HLD (hyperlipidemia) 08/20/2015  . Carotid artery stenosis, symptomatic 07/03/2015  . Carotid stenosis, right   . TIA (transient ischemic attack) 06/18/2015  . ASCVD (arteriosclerotic cardiovascular disease) 06/18/2015  . Diabetes mellitus without complication ([place])  06/18/2015  . Resistant hypertension 06/18/2015  . Hyperlipidemia 06/18/2015  . Spinal stenosis of lumbar region with neurogenic claudication 09/06/2014  . Atherosclerosis of native artery of extremity with intermittent claudication ([place]) 03/09/2013  . HYPERLIPIDEMIA 04/08/2009  . TOBACCO ABUSE 04/08/2009  . ATHEROSCLEROTIC CARDIOVASCULAR DISEASE 04/08/2009  .  INTERMITTENT VERTIGO 04/08/2009  . ADENOCARCINOMA, PROSTATE 03/21/2009  . ABDOMINAL PAIN -GENERALIZED 03/21/2009  . History of coronary artery bypass graft 06/28/2008   PCP:  Maurice Small, MD Pharmacy:   [facility] [place], [place] - [address] [place] [address] Phone: [phone] Fax: [phone]  [place], [place] - [address] [place] [address] Phone: [phone] Fax: [phone]  [facility] DR[facility] STORE [place], [place] [address] Phone: [phone] Fax: [phone]     Social Determinants of Health (SDOH) Interventions    Readmission Risk Interventions No flowsheet data found.

## 2020-03-20 NOTE — Progress Notes (Signed)
Physical Therapy Treatment Patient Details Name: [name] MRN: [id number] DOB: 1945-12-08 Today's Date: 03/20/2020    History of Present Illness Patient is a 74 y/o male who fell at home secondary to seizure; admitted with status epilepticus in the setting of known GBM. Head CT-significant edema in right parietooccipital region. PMH includes prostate ca s/p radiation, CAd, chronic diastolic heart failure, DM2, essential HTN, CVA.    PT Comments    Pt tolerates treatment well with improved ambulation distance. Pt does continue to require cues to improve transfer technique as well as close guard to maintain safety. Pt's spouse present for session and is very attentive to safety techniques and encourages visual scanning to compensate for L visual field cue. Pt and spouse eager for a return home, both in agreement with PT recommendations for outpatient PT. Pt will continue to benefit from acute therapies during this admission.   Follow Up Recommendations  Outpatient PT;Supervision for mobility/OOB     Equipment Recommendations  None recommended by PT    Recommendations for Other Services       Precautions / Restrictions Precautions Precautions: Fall Precaution Comments: seizures Restrictions Weight Bearing Restrictions: No    Mobility  Bed Mobility                  Transfers Overall transfer level: Needs assistance Equipment used: Rolling walker (2 wheeled) Transfers: Sit to/from Stand Sit to Stand: Min guard         General transfer comment: PT provides cues for increased trunk flexion, pt utilizing rocking to create momentum into transfers  Ambulation/Gait Ambulation/Gait assistance: Min guard Gait Distance (Feet): 120 Feet Assistive device: Rolling walker (2 wheeled) Gait Pattern/deviations: Step-to pattern Gait velocity: reduced Gait velocity interpretation: <1.8 ft/sec, indicate of risk for recurrent falls General Gait Details: pt with short step to  gait, slight increase in trunk flexion, reduced gait speed   Stairs             Wheelchair Mobility    Modified Rankin (Stroke Patients Only)       Balance Overall balance assessment: Needs assistance Sitting-balance support: No upper extremity supported;Feet supported Sitting balance-Leahy Scale: Good     Standing balance support: Single extremity supported;Bilateral upper extremity supported Standing balance-Leahy Scale: Poor Standing balance comment: reliant on UE support of RW                            Cognition Arousal/Alertness: Awake/alert Behavior During Therapy: WFL for tasks assessed/performed Overall Cognitive Status: Impaired/Different from baseline Area of Impairment: Memory;Safety/judgement;Awareness;Problem solving                     Memory: Decreased short-term memory Following Commands: Follows one step commands consistently;Follows multi-step commands with increased time Safety/Judgement: Decreased awareness of safety;Decreased awareness of deficits Awareness: Emergent Problem Solving: Slow processing        Exercises      General Comments General comments (skin integrity, edema, etc.): pt on 2L [place] upon PT arrival, weaned to room air with sats ranging 92-93%, sats from 91-93% with mobility. Pt returned to 2L [place] at end of session      Pertinent Vitals/Pain Pain Assessment: No/denies pain    Home Living                      Prior Function            PT Goals (current  goals can now be found in the care plan section) Acute Rehab PT Goals Patient Stated Goal: to go home Progress towards PT goals: Progressing toward goals    Frequency    Min 3X/week      PT Plan Current plan remains appropriate    Co-evaluation              AM-PAC PT "6 Clicks" Mobility   Outcome Measure  Help needed turning from your back to your side while in a flat bed without using bedrails?: None Help needed moving  from lying on your back to sitting on the side of a flat bed without using bedrails?: A Little Help needed moving to and from a bed to a chair (including a wheelchair)?: A Little Help needed standing up from a chair using your arms (e.g., wheelchair or bedside chair)?: A Little Help needed to walk in hospital room?: A Little Help needed climbing 3-5 steps with a railing? : A Little 6 Click Score: 19    End of Session Equipment Utilized During Treatment: Oxygen Activity Tolerance: Patient tolerated treatment well Patient left: in chair;with call bell/phone within reach;with chair alarm set;with family/visitor present Nurse Communication: Mobility status PT Visit Diagnosis: Muscle weakness (generalized) (M62.81);Difficulty in walking, not elsewhere classified (R26.2)     Time: 9485-4627 PT Time Calculation (min) (ACUTE ONLY): 30 min  Charges:  $Gait Training: 8-22 mins $Therapeutic Activity: 8-22 mins                     Zenaida Niece, PT, DPT Acute Rehabilitation Pager: [phone]    Zenaida Niece 03/20/2020, 12:54 PM

## 2020-03-21 DIAGNOSIS — C719 Malignant neoplasm of brain, unspecified: Secondary | ICD-10-CM | POA: Diagnosis not present

## 2020-03-21 LAB — CBC WITH DIFFERENTIAL/PLATELET
Abs Immature Granulocytes: 0.06 10*3/uL (ref 0.00–0.07)
Basophils Absolute: 0 10*3/uL (ref 0.0–0.1)
Basophils Relative: 0 %
Eosinophils Absolute: 0 10*3/uL (ref 0.0–0.5)
Eosinophils Relative: 0 %
HCT: 37.5 % — ABNORMAL LOW (ref 39.0–52.0)
Hemoglobin: 13.4 g/dL (ref 13.0–17.0)
Immature Granulocytes: 2 %
Lymphocytes Relative: 4 %
Lymphs Abs: 0.2 10*3/uL — ABNORMAL LOW (ref 0.7–4.0)
MCH: 31.6 pg (ref 26.0–34.0)
MCHC: 35.7 g/dL (ref 30.0–36.0)
MCV: 88.4 fL (ref 80.0–100.0)
Monocytes Absolute: 0.3 10*3/uL (ref 0.1–1.0)
Monocytes Relative: 8 %
Neutro Abs: 3.3 10*3/uL (ref 1.7–7.7)
Neutrophils Relative %: 86 %
Platelets: 112 10*3/uL — ABNORMAL LOW (ref 150–400)
RBC: 4.24 MIL/uL (ref 4.22–5.81)
RDW: 13.7 % (ref 11.5–15.5)
WBC: 3.8 10*3/uL — ABNORMAL LOW (ref 4.0–10.5)
nRBC: 0 % (ref 0.0–0.2)

## 2020-03-21 LAB — BASIC METABOLIC PANEL
Anion gap: 12 (ref 5–15)
BUN: 18 mg/dL (ref 8–23)
CO2: 27 mmol/L (ref 22–32)
Calcium: 9.2 mg/dL (ref 8.9–10.3)
Chloride: 96 mmol/L — ABNORMAL LOW (ref 98–111)
Creatinine, Ser: 0.7 mg/dL (ref 0.61–1.24)
GFR, Estimated: >60 mL/min (ref >60)
Glucose, Bld: 220 mg/dL — ABNORMAL HIGH (ref 70–99)
Potassium: 4.6 mmol/L (ref 3.5–5.1)
Sodium: 135 mmol/L (ref 135–145)

## 2020-03-21 LAB — GLUCOSE, CAPILLARY
Glucose-Capillary: 191 mg/dL — ABNORMAL HIGH (ref 70–99)
Glucose-Capillary: 177 mg/dL — ABNORMAL HIGH (ref 70–99)

## 2020-03-21 LAB — MAGNESIUM: Magnesium: 2 mg/dL (ref 1.7–2.4)

## 2020-03-21 MED ORDER — DEXAMETHASONE 2 MG PO TABS
2.0000 mg | ORAL_TABLET | ORAL | Status: DC
Start: 2020-03-21 — End: 2020-05-13

## 2020-03-21 MED ORDER — LEVETIRACETAM 750 MG PO TABS: 1500.0000 mg | ORAL_TABLET | Freq: Two times a day (BID) | ORAL | 3 refills | Status: DC

## 2020-03-21 NOTE — TOC Transition Note (Signed)
Transition of Care [facility]) - CM/SW Discharge Note   Patient Details  Name: [name] MRN: [id number] Date of Birth: January 07, 1946  [facility] of [facility] CM/SW Contact:  Pollie Friar, RN Phone Number: 03/21/2020, 1:21 PM   Clinical Narrative:    Pt is discharging home with outpatient therapy at [facility]. Information on the AVS. Pt has transportation home.    Final next level of care: OP Rehab Barriers to Discharge: No Barriers Identified   Patient Goals and CMS Choice   CMS Medicare.gov Compare Post Acute Care list provided to:: Patient Choice offered to / list presented to : Patient  Discharge Placement                       Discharge Plan and Services   Discharge Planning Services: CM Consult                                 Social Determinants of Health (SDOH) Interventions     Readmission Risk Interventions No flowsheet data found.

## 2020-03-21 NOTE — Discharge Instructions (Signed)
Glioblastoma  Glioblastoma, also called Grade IV astrocytoma, is a type of brain cancer. Glioblastoma tumors are made up of an overgrowth of normal brain cells. This type of cancer can grow quickly. What are the causes? A tumor is formed when normal brain cells grow into a mass of tissue. What causes normal brain cells to grow into a mass of tissue is not known. What increases the risk? The following factors may make you more likely to develop this condition:  Radiation exposure.  Family history of cancer syndrome, such as Li-Fraumeni syndrome or Turcot syndrome.  Age. People between the ages of 33 and 72 are more likely to develop this tumor. What are the signs or symptoms? Symptoms of this condition may depend on the size and location of the tumor. Symptoms may include:  Headache, which may be worse in the morning.  Nausea and vomiting.  Vision changes.  Seizures.  Not being able to walk.  Weakness or numbness on one side of the body or in an arm or leg.  Mood changes.  Problems with memory or thinking.  Drowsiness. Symptoms are most commonly caused by increased pressure in the brain. How is this diagnosed? This condition is diagnosed based on a medical history and a physical exam. Brain imaging tests will also be done, such as a CT scan or MRI. A sample of the tumor will be taken and studied in a laboratory (biopsy) to confirm the diagnosis. How is this treated? There are several treatment options for this condition. Often, a person will have more than one type of treatment. Treatment may include:  Surgery to remove as much of the tumor as possible.  High-energy rays (radiation therapy) to help shrink or kill the tumor.  Chemotherapy to shrink or kill the tumor. Because normal cells may also be killed, chemotherapy causes many side effects.  Targeted therapy. This uses substances that damage or kill cancer cells without affecting normal cells.  Steroid medicine to  decrease brain swelling and improve symptoms.  New treatments through clinical trials. Follow these instructions at home:  Take over-the-counter and prescription medicines only as told by your health care provider.  Consider joining a support group.  Keep all follow-up visits as told by your health care provider. This is important. Where to find more information  [place]: https://www.cancer.gov  American Cancer Society: http://www.cancer.org Contact a health care provider if:  Any of your symptoms come back.  You have diarrhea, vomiting, or abdominal pain.  You cannot eat or drink what you need.  You feel weaker and more tired than usual.  You lose weight without trying. Get help right away if:  Your diarrhea, vomiting, or abdominal pain does not go away.  You have new symptoms, such as vision problems or trouble walking.  You have a seizure.  You have bleeding that does not stop.  You have trouble breathing.  You have a fever. Summary  Glioblastoma, also called Grade IV astrocytoma, is a type of brain cancer. Glioblastoma tumors are made up of an overgrowth of normal brain cells. This type of cancer can grow quickly.  This condition is diagnosed based on a medical history and a physical exam. Brain imaging tests will also be done, such as a CT scan or MRI.  A sample of the tumor will be taken and studied in a laboratory (biopsy) to confirm the diagnosis.  Treatment may include surgery to remove the tumor as well as radiation therapy and chemotherapy. This information  is not intended to replace advice given to you by your health care provider. Make sure you discuss any questions you have with your health care provider. Document Revised: 03/19/2017 Document Reviewed: 05/05/2016 Elsevier Patient Education  [place].

## 2020-03-21 NOTE — Progress Notes (Signed)
Physical Therapy Treatment Patient Details Name: [name] MRN: [id number] DOB: 1946-02-09 Today's Date: 03/21/2020    History of Present Illness Patient is a 74 y/o male who fell at home secondary to seizure; admitted with status epilepticus in the setting of known GBM. Head CT-significant edema in right parietooccipital region. PMH includes prostate ca s/p radiation, CAd, chronic diastolic heart failure, DM2, essential HTN, CVA.    PT Comments    Pt tolerates treatment well with some improvement in activity tolerance. Pt does continue to demonstrate LE power deficits, requiring multiple attempts to complete transfers at times despite good technique. Pt also demonstrates some impairments in memory, reportedly calling many family members overnight which he has no recall of. Pt will continue to benefit from acute PT POC to improve LE strength and power in an effort to improve independence and reduce caregiver burden. PT continues to recommend discharge home with outpatient PT and supervision/assistance from spouse for all OOB mobility.   Follow Up Recommendations  Outpatient PT;Supervision for mobility/OOB     Equipment Recommendations  None recommended by PT    Recommendations for Other Services       Precautions / Restrictions Precautions Precautions: Fall Precaution Comments: seizures Restrictions Weight Bearing Restrictions: No    Mobility  Bed Mobility               General bed mobility comments: pt received and left in recliner, no bed mobility performed  Transfers Overall transfer level: Needs assistance Equipment used: Rolling walker (2 wheeled) Transfers: Sit to/from Stand Sit to Stand: Min guard;Supervision (minG progressing to close supervision)         General transfer comment: pt demonstrates retention of cues for forward trunk lean. Pt requires minG for initial transfer, progressing to close supervision with 3 transfer attempts after ambulating. Pt  lacks LE power at this time, resulting in assistance requirements  Ambulation/Gait Ambulation/Gait assistance: Min guard Gait Distance (Feet): 150 Feet Assistive device: Rolling walker (2 wheeled) Gait Pattern/deviations: Step-to pattern;Step-through pattern Gait velocity: reduced Gait velocity interpretation: <1.8 ft/sec, indicate of risk for recurrent falls General Gait Details: pt with step-to gait initially, progressing to step-through gait with PT verbal cues. PT adjusts walker height to promote more upright posture and encourages the pt to maintain BOS within the frame of RW   Stairs             Wheelchair Mobility    Modified Rankin (Stroke Patients Only)       Balance Overall balance assessment: Needs assistance Sitting-balance support: No upper extremity supported;Feet supported Sitting balance-Leahy Scale: Good     Standing balance support: Single extremity supported;Bilateral upper extremity supported Standing balance-Leahy Scale: Poor Standing balance comment: reliant on UE support of RW                            Cognition Arousal/Alertness: Awake/alert Behavior During Therapy: WFL for tasks assessed/performed Overall Cognitive Status: Impaired/Different from baseline Area of Impairment: Memory                     Memory: Decreased short-term memory Following Commands: Follows one step commands consistently              Exercises      General Comments General comments (skin integrity, edema, etc.): VSS on RA. Per spouse report pt made many phone calls overnight and in the early morning. Pt has little recall of most of  these calls      Pertinent Vitals/Pain Pain Assessment: No/denies pain    Home Living                      Prior Function            PT Goals (current goals can now be found in the care plan section) Acute Rehab PT Goals Patient Stated Goal: to go home Progress towards PT goals: Progressing  toward goals    Frequency    Min 3X/week      PT Plan Current plan remains appropriate    Co-evaluation              AM-PAC PT "6 Clicks" Mobility   Outcome Measure  Help needed turning from your back to your side while in a flat bed without using bedrails?: None Help needed moving from lying on your back to sitting on the side of a flat bed without using bedrails?: A Little Help needed moving to and from a bed to a chair (including a wheelchair)?: A Little Help needed standing up from a chair using your arms (e.g., wheelchair or bedside chair)?: A Little Help needed to walk in hospital room?: A Little Help needed climbing 3-5 steps with a railing? : A Little 6 Click Score: 19    End of Session Equipment Utilized During Treatment: Gait belt Activity Tolerance: Patient tolerated treatment well Patient left: in chair;with call bell/phone within reach;with chair alarm set;with family/visitor present Nurse Communication: Mobility status PT Visit Diagnosis: Muscle weakness (generalized) (M62.81);Difficulty in walking, not elsewhere classified (R26.2)     Time: 9211-9417 PT Time Calculation (min) (ACUTE ONLY): 23 min  Charges:  $Gait Training: 8-22 mins $Therapeutic Activity: 8-22 mins                     Zenaida Niece, PT, DPT Acute Rehabilitation Pager: [phone]    Zenaida Niece 03/21/2020, 10:11 AM

## 2020-03-21 NOTE — Discharge Summary (Signed)
Physician Discharge Summary   [name] GGY:[id number] DOB: Sep 01, 1945 DOA: 03/16/2020  PCP: Maurice Small, MD  Admit date: 03/16/2020 Discharge date: 03/21/2020  Admitted From: Home Disposition: Home with outpatient PT Discharging physician: Dwyane Dee, MD  Recommendations for Outpatient Follow-up:  1. Follow-up with oncology   Patient discharged to home in Discharge Condition: stable CODE STATUS: Full Diet recommendation:  Diet Orders (From admission, onward)    Start     Ordered   03/21/20 0000  Diet Carb Modified        03/21/20 1203   03/18/20 0748  Diet Carb Modified Fluid consistency: Thin; Room service appropriate? Yes  Diet effective now       Question Answer Comment  Diet-HS Snack? Nothing   Calorie Level Medium 1600-2000   Fluid consistency: Thin   Room service appropriate? 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  He was evaluated by neurology on admission and Keppra dose was increased. No further seizure episodes were noted during hospitalization and patient remained stable overall. He was also continued on Decadron.  He was evaluated by physical therapy with recommendations for outpatient PT at discharge.  Recurrent seizures Was given Ativan in the ED and loaded with Keppra. Neurology was consulted - Keppra increased to 1500 mg twice daily -Evaluated by PT, stable for outpatient PT which he was doing prior to admission  DKA/diabetes mellitus without complication ([place]): With an anion gap on admission was started on IV insulin IV fluids was given a single dose of long-acting insulin plus sliding scale currently on dexamethasone Cont long acting insulin and SSI.  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  Wife states that he was on 6 mg total daily at home (4mg  in am and 2 mg in pm) -Instructed her to continue home dosing at discharge and follow-up with oncology  Acute metabolic encephalopathy Sundowning Resolved. Cont haldol IV PRN, melatonin at night. -Expect this to improve at discharge upon returning home   Sinus tach/Essential HTN: Start metoprolol low dose, at home he is on norvasc, will benefit more from metoprolol due to his HR. Denies pain, Hbg stable, check TSH (normal)   The patient's chronic medical conditions were treated accordingly per the patient's home medication regimen except as noted.  On day of discharge, patient was felt deemed stable for discharge. Patient/family member advised to call PCP or come back to ER if needed.   Principal Diagnosis: Seizure [facility])  Discharge Diagnoses: Active Hospital Problems   Diagnosis Date Noted  . Seizure ([place]) 03/16/2020  . Glioblastoma with isocitrate dehydrogenase gene wildtype ([place]) 07/04/2019  . Diabetes mellitus without complication ([place]) 03/50/0938    Resolved Hospital Problems  No resolved problems to display.    Discharge Instructions    Ambulatory referral to Occupational Therapy   Complete by: As directed    Ambulatory referral to Physical Therapy   Complete by: As directed    Diet Carb Modified   Complete by: As directed    Increase activity slowly   Complete by: As directed      Allergies as of 03/21/2020      Reactions   Lisinopril Cough      Medication List  STOP taking these medications   acetaminophen 325 MG tablet Commonly known as: TYLENOL   LORazepam 0.5 MG tablet Commonly known as: Ativan   polyethylene glycol 17 g packet Commonly known as: MIRALAX / GLYCOLAX   rivaroxaban 20 MG Tabs tablet Commonly known as: XARELTO     TAKE these medications   amLODipine 10 MG tablet Commonly known as: NORVASC Take 1 tablet (10 mg total) by  mouth every evening.   atorvastatin 80 MG tablet Commonly known as: LIPITOR Take 1 tablet (80 mg total) by mouth at bedtime.   calcium citrate 950 (200 Ca) MG tablet Commonly known as: CALCITRATE - dosed in mg elemental calcium Take 1 tablet (200 mg of elemental calcium total) by mouth daily.   dexamethasone 2 MG tablet Commonly known as: DECADRON Take 1-2 tablets (2-4 mg total) by mouth See admin instructions. Take 1 tablet (2mg ) by mouth in the morning and 2 tablets (4mg ) by mouth in the evening.   levETIRAcetam 750 MG tablet Commonly known as: Keppra Take 2 tablets (1,500 mg total) by mouth 2 (two) times daily. What changed:   medication strength  how much to take   metoprolol succinate 25 MG 24 hr tablet Commonly known as: TOPROL-XL Take 0.5 tablets (12.5 mg total) by mouth every evening.   ondansetron 8 MG tablet Commonly known as: Zofran Take 1 tablet (8 mg total) by mouth 2 (two) times daily as needed (nausea and vomiting). May take 30-60 minutes prior to Temodar administration if nausea/vomiting occurs.   temozolomide 180 MG capsule Commonly known as: TEMODAR Take 1 capsule (180 mg total) by mouth daily. May take on an empty stomach to decrease nausea & vomiting. What changed:   when to take this  additional instructions  Another medication with the same name was removed. Continue taking this medication, and follow the directions you see here.   temozolomide 140 MG capsule Commonly known as: TEMODAR Take 1 capsule (140 mg total) by mouth daily. May take on an empty stomach to decrease nausea & vomiting. What changed:   when to take this  additional instructions  Another medication with the same name was removed. Continue taking this medication, and follow the directions you see here.       Follow-up Information    [facility].   Specialty: Rehabilitation Why: The outpatient therapy will contact you  for the first appointment Contact information: [place] [address] [phone]             Allergies  Allergen Reactions  . Lisinopril Cough    Consultations: Neurology  Discharge Exam: BP 132/87 (BP Location: Left Arm)   Pulse 73   Temp 98.1 F (36.7 C) (Oral)   Resp 20   Wt 93.3 kg   SpO2 96%   BMI 26.41 kg/m  General appearance: alert, cooperative and no distress Head: Normocephalic, without obvious abnormality, atraumatic Eyes: EOMI Lungs: clear to auscultation bilaterally Heart: regular rate and rhythm and S1, S2 normal Abdomen: normal findings: bowel sounds normal and soft, non-tender Extremities: No edema Skin: mobility and turgor normal Neurologic: Grossly normal  The results of significant diagnostics from this hospitalization (including imaging, microbiology, ancillary and laboratory) are listed below for reference.   Microbiology: Recent Results (from the past 240 hour(s))  Resp Panel by RT-PCR (Flu A&B, Covid) Nasopharyngeal Swab     Status: None   Collection Time: 03/16/20  9:00 PM   Specimen: Nasopharyngeal  Swab; Nasopharyngeal(NP) swabs in vial transport medium  Result Value Ref Range Status   SARS Coronavirus 2 by RT PCR NEGATIVE NEGATIVE Final    Comment: (NOTE) SARS-CoV-2 target nucleic acids are NOT DETECTED.  The SARS-CoV-2 RNA is generally detectable in upper respiratory specimens during the acute phase of infection. The lowest concentration of SARS-CoV-2 viral copies this assay can detect is 138 copies/mL. A negative result does not preclude SARS-Cov-2 infection and should not be used as the sole basis for treatment or other patient management decisions. A negative result may occur with  improper specimen collection/handling, submission of specimen other than nasopharyngeal swab, presence of viral mutation(s) within the areas targeted by this assay, and inadequate number of  viral copies(<138 copies/mL). A negative result must be combined with clinical observations, patient history, and epidemiological information. The expected result is Negative.  Fact Sheet for Patients:  EntrepreneurPulse.com.au  Fact Sheet for Healthcare Providers:  IncredibleEmployment.be  This test is no t yet approved or cleared by the [place] FDA and  has been authorized for detection and/or diagnosis of SARS-CoV-2 by FDA under an Emergency Use Authorization (EUA). This EUA will remain  in effect (meaning this test can be used) for the duration of the COVID-19 declaration under Section 564(b)(1) of the Act, 21 U.S.C.section 360bbb-3(b)(1), unless the authorization is terminated  or revoked sooner.       Influenza A by PCR NEGATIVE NEGATIVE Final   Influenza B by PCR NEGATIVE NEGATIVE Final    Comment: (NOTE) The Xpert Xpress SARS-CoV-2/FLU/RSV plus assay is intended as an aid in the diagnosis of influenza from Nasopharyngeal swab specimens and should not be used as a sole basis for treatment. Nasal washings and aspirates are unacceptable for Xpert Xpress SARS-CoV-2/FLU/RSV testing.  Fact Sheet for Patients: EntrepreneurPulse.com.au  Fact Sheet for Healthcare Providers: IncredibleEmployment.be  This test is not yet approved or cleared by the [place] FDA and has been authorized for detection and/or diagnosis of SARS-CoV-2 by FDA under an Emergency Use Authorization (EUA). This EUA will remain in effect (meaning this test can be used) for the duration of the COVID-19 declaration under Section 564(b)(1) of the Act, 21 U.S.C. section 360bbb-3(b)(1), unless the authorization is terminated or revoked.  Performed at [facility], [place] [address], [place], [place] [address]   MRSA PCR Screening     Status: None   Collection Time: 03/17/20  2:15 AM   Specimen: Nasal Mucosa; Nasopharyngeal   Result Value Ref Range Status   MRSA by PCR NEGATIVE NEGATIVE Final    Comment:        The GeneXpert MRSA Assay (FDA approved for NASAL specimens only), is one component of a comprehensive MRSA colonization surveillance program. It is not intended to diagnose MRSA infection nor to guide or monitor treatment for MRSA infections. Performed at [facility], [place] [address],  [place], [place] [address]      Labs: BNP (last 3 results) No results for input(s): BNP in the last 8760 hours. Basic Metabolic Panel: Recent Labs  Lab 03/16/20 2103 03/16/20 2103 03/16/20 2107 03/16/20 2144 03/17/20 0123 03/17/20 1741 03/21/20 0503  NA 134*   < > 131* 132* 135 135 135  K 4.8   < > 4.8 4.8 3.9 4.0 4.6  CL 96*  --  100  --   --  99 96*  CO2 20*  --   --   --   --  27 27  GLUCOSE 495*  --  514*  --   --  154* 220*  BUN 28*  --  32*  --   --  17 18  CREATININE 1.22  --  0.90  --   --  0.70 0.70  CALCIUM 9.0  --   --   --   --  8.6* 9.2  MG 1.9  --   --   --   --   --  2.0  PHOS 5.2*  --   --   --   --   --   --    < > = values in this interval not displayed.   Liver Function Tests: Recent Labs  Lab 03/16/20 2103  AST 25  ALT 30  ALKPHOS 70  BILITOT 1.5*  PROT 6.6  ALBUMIN 3.8   No results for input(s): LIPASE, AMYLASE in the last 168 hours. No results for input(s): AMMONIA in the last 168 hours. CBC: Recent Labs  Lab 03/16/20 2103 03/16/20 2103 03/16/20 2107 03/16/20 2144 03/17/20 0123 03/17/20 1741 03/21/20 0503  WBC 7.5  --   --   --   --  2.2* 3.8*  NEUTROABS 5.9  --   --   --   --   --  3.3  HGB 15.2   < > 15.3 14.6 12.6* 14.2 13.4  HCT 45.8   < > 45.0 43.0 37.0* 41.3 37.5*  MCV 91.8  --   --   --   --  88.4 88.4  PLT 137*  --   --   --   --  84* 112*   < > = values in this interval not displayed.   Cardiac Enzymes: No results for input(s): CKTOTAL, CKMB, CKMBINDEX, TROPONINI in the last 168 hours. BNP: Invalid input(s): POCBNP CBG: Recent Labs   Lab 03/20/20 1212 03/20/20 1622 03/20/20 2109 03/21/20 0557 03/21/20 1249  GLUCAP 160* 152* 167* 191* 177*   D-Dimer No results for input(s): DDIMER in the last 72 hours. Hgb A1c No results for input(s): HGBA1C in the last 72 hours. Lipid Profile No results for input(s): CHOL, HDL, LDLCALC, TRIG, CHOLHDL, LDLDIRECT in the last 72 hours. Thyroid function studies Recent Labs    03/19/20 0824  TSH 0.641   Anemia work up No results for input(s): VITAMINB12, FOLATE, FERRITIN, TIBC, IRON, RETICCTPCT in the last 72 hours. Urinalysis    Component Value Date/Time   COLORURINE YELLOW 10/08/2019 2355   APPEARANCEUR CLEAR 10/08/2019 2355   LABSPEC 1.018 10/08/2019 2355   PHURINE 7.0 10/08/2019 2355   GLUCOSEU NEGATIVE 10/08/2019 2355   HGBUR NEGATIVE 10/08/2019 2355   BILIRUBINUR NEGATIVE 10/08/2019 2355   KETONESUR NEGATIVE 10/08/2019 2355   PROTEINUR 30 (A) 10/08/2019 2355   UROBILINOGEN 2.0 (H) 08/28/2019 1902   NITRITE NEGATIVE 10/08/2019 2355   LEUKOCYTESUR NEGATIVE 10/08/2019 2355   Sepsis Labs Invalid input(s): PROCALCITONIN,  WBC,  LACTICIDVEN Microbiology Recent Results (from the past 240 hour(s))  Resp Panel by RT-PCR (Flu A&B, Covid) Nasopharyngeal Swab     Status: None   Collection Time: 03/16/20  9:00 PM   Specimen: Nasopharyngeal Swab; Nasopharyngeal(NP) swabs in vial transport medium  Result Value Ref Range Status   SARS Coronavirus 2 by RT PCR NEGATIVE NEGATIVE Final    Comment: (NOTE) SARS-CoV-2 target nucleic acids are NOT DETECTED.  The SARS-CoV-2 RNA is generally detectable in upper respiratory specimens during the acute phase of infection. The lowest concentration of SARS-CoV-2 viral copies this assay can detect is 138 copies/mL. A negative result  does not preclude SARS-Cov-2 infection and should not be used as the sole basis for treatment or other patient management decisions. A negative result may occur with  improper specimen collection/handling,  submission of specimen other than nasopharyngeal swab, presence of viral mutation(s) within the areas targeted by this assay, and inadequate number of viral copies(<138 copies/mL). A negative result must be combined with clinical observations, patient history, and epidemiological information. The expected result is Negative.  Fact Sheet for Patients:  EntrepreneurPulse.com.au  Fact Sheet for Healthcare Providers:  IncredibleEmployment.be  This test is no t yet approved or cleared by the [place] FDA and  has been authorized for detection and/or diagnosis of SARS-CoV-2 by FDA under an Emergency Use Authorization (EUA). This EUA will remain  in effect (meaning this test can be used) for the duration of the COVID-19 declaration under Section 564(b)(1) of the Act, 21 U.S.C.section 360bbb-3(b)(1), unless the authorization is terminated  or revoked sooner.       Influenza A by PCR NEGATIVE NEGATIVE Final   Influenza B by PCR NEGATIVE NEGATIVE Final    Comment: (NOTE) The Xpert Xpress SARS-CoV-2/FLU/RSV plus assay is intended as an aid in the diagnosis of influenza from Nasopharyngeal swab specimens and should not be used as a sole basis for treatment. Nasal washings and aspirates are unacceptable for Xpert Xpress SARS-CoV-2/FLU/RSV testing.  Fact Sheet for Patients: EntrepreneurPulse.com.au  Fact Sheet for Healthcare Providers: IncredibleEmployment.be  This test is not yet approved or cleared by the [place] FDA and has been authorized for detection and/or diagnosis of SARS-CoV-2 by FDA under an Emergency Use Authorization (EUA). This EUA will remain in effect (meaning this test can be used) for the duration of the COVID-19 declaration under Section 564(b)(1) of the Act, 21 U.S.C. section 360bbb-3(b)(1), unless the authorization is terminated or revoked.  Performed at [facility], [place] [address] [place], [place] [address]   MRSA PCR Screening     Status: None   Collection Time: 03/17/20  2:15 AM   Specimen: Nasal Mucosa; Nasopharyngeal  Result Value Ref Range Status   MRSA by PCR NEGATIVE NEGATIVE Final    Comment:        The GeneXpert MRSA Assay (FDA approved for NASAL specimens only), is one component of a comprehensive MRSA colonization surveillance program. It is not intended to diagnose MRSA infection nor to guide or monitor treatment for MRSA infections. Performed at [facility], [place] [address] [place], [place] [address]     Procedures/Studies: CT Head Wo Contrast  Result Date: 03/16/2020 CLINICAL DATA:  Initial evaluation for acute seizure, history of right occipital glioblastoma, status post surgical debulking and XRT with concurrent Temodar. EXAM: CT HEAD WITHOUT CONTRAST TECHNIQUE: Contiguous axial images were obtained from the base of the skull through the vertex without intravenous contrast. COMPARISON:  Prior brain MRI from 03/13/2020. FINDINGS: Brain: Postoperative changes from right occipital craniotomy for tumor debulking again seen. Residual masslike opacity within the underlying right occipital lobe again seen, grossly stable from prior MRI. Progressive calcification/mineralization at the overlying right occipital cortex as compared to most recent CT from June. Probable extension across the splenium again noted, better appreciated on prior MRI. Surrounding hypodensity within the right temporal occipital region not significantly changed. Similar mass effect on the right lateral ventricle with up to 4 mm of right-to-left shift. No evidence for interval hemorrhage or other complication. Remainder the brain is otherwise stable and negative in appearance. No other acute large vessel  territory infarct or hemorrhage. No extra-axial collection. Vascular: No hyperdense vessel. Scattered vascular calcifications noted within the carotid siphons. Skull: Prior  right craniotomy.  No acute finding. Sinuses/Orbits: Globes and orbital soft tissues within normal limits. Paranasal sinuses are largely clear. No significant mastoid effusion. Other: None. IMPRESSION: 1. No significant interval change in appearance of patient's known glioblastoma centered at the right occipital lobe. Surrounding edema and regional mass effect with up to 4 mm of right-to-left shift relatively unchanged. No interval hemorrhage or other complication. 2. Otherwise stable and negative head CT. No other acute intracranial finding. Electronically Signed   By: Jeannine Boga M.D.   On: 03/16/2020 22:49   MR Brain W Wo Contrast  Result Date: 03/13/2020 CLINICAL DATA:  Right occipital glioblastoma. Debulking on 07/04/2019 followed by radiation therapy and concurrent Temodar from 08/17/2019-09/28/2019. Ongoing Temodar therapy. EXAM: MRI HEAD WITHOUT AND WITH CONTRAST TECHNIQUE: Multiplanar, multiecho pulse sequences of the brain and surrounding structures were obtained without and with intravenous contrast. CONTRAST:  9mL GADAVIST GADOBUTROL 1 MMOL/ML IV SOLN COMPARISON:  12/29/2019 FINDINGS: The patient could not tolerate the complete examination. Sagittal T1 weighted postcontrast imaging was not obtained, and the coronal T1 postcontrast sequence is incomplete and motion degraded. Brain: A large, irregular, peripherally enhancing mass centered in the right occipital lobe has mildly enlarged, measuring 8.0 x 5.4 x 4.7 cm (previously 7.5 x 5.0 x 4.4 cm). There is persistent heterogeneously restricted diffusion within the mass, and numerous small foci of susceptibility artifact compatible with hemorrhage within the mass have mildly increased in number. Nonenhancing T2 hyperintensity throughout the surrounding white matter of the right occipital, temporal, and parietal lobes has mildly decreased with slightly decreased effacement of the right lateral ventricle and unchanged leftward midline shift of 4  mm. Extremely bright T2 and FLAIR signal abnormality extending leftward across the splenium of the corpus callosum has also decreased suggesting decreased edema, however less intense T2 and FLAIR signal abnormality in the corpus callosum with associated enhancement, diffusion abnormality, and masslike enlargement of the corpus callosum has progressed. A few punctate foci of T2 hyperintensity scattered elsewhere in the cerebral white matter bilaterally are unchanged and nonspecific. No acute infarct or extra-axial fluid collection is evident. Vascular: Major intracranial arterial flow voids are preserved. Filling defect consistent with thrombus in the right transverse and proximal right sigmoid sinus is unchanged. Skull and upper cervical spine: Right occipital craniotomy. Sinuses/Orbits: Unremarkable orbits. Paranasal sinuses and mastoid air cells are clear. Other: None. IMPRESSION: Findings concerning for tumor progression mainly in the splenium of the corpus callosum. Mildly decreased edema with unchanged midline shift. Electronically Signed   By: Logan Bores M.D.   On: 03/13/2020 15:57   DG Chest Port 1 View  Result Date: 03/16/2020 CLINICAL DATA:  Unresponsive. EXAM: PORTABLE CHEST 1 VIEW COMPARISON:  July 05, 2019 FINDINGS: The heart size is enlarged. The patient is status post prior median sternotomy. The lung volumes are low. There are trace bilateral pleural effusions. Bibasilar airspace disease is noted and favored to represent passive atelectasis. There is no pneumothorax. No acute displaced fracture. Aortic calcifications are noted. IMPRESSION: 1. Low lung volumes with trace bilateral pleural effusions and bibasilar airspace disease, favored to represent passive atelectasis. 2. Cardiomegaly. Electronically Signed   By: Constance Holster M.D.   On: 03/16/2020 21:28   EEG adult  Result Date: 03/17/2020 Lora Havens, MD     03/17/2020  2:23 PM Patient Name: [name] MRN: [id number]  Epilepsy Attending: Marcelle Overlie  Barbra Sarks Referring Physician/Provider: Dr Roland Rack Date: 03/17/2020 Duration: 27.51 mins Patient history: 74 year old male presenting with focal status epilepticus in the setting of known glioblastoma. EEG to evaluate for seizure. Level of alertness: Awake, asleep AEDs during EEG study: LEV Technical aspects: This EEG study was done with scalp electrodes positioned according to the 10-20 International system of electrode placement. Electrical activity was acquired at a sampling rate of 500Hz  and reviewed with a high frequency filter of 70Hz  and a low frequency filter of 1Hz . EEG data were recorded continuously and digitally stored. Description: No posterior dominant rhythm was seen. Sleep was characterized by vertex waves, sleep spindles (12 to 14 Hz), maximal frontocentral region.  EEG showed continuous generalized 3 to 6 Hz theta-delta slowing with overriding 15-18hz  beta activity. Sharp waves were seen in right parieto-occipital region, at times periodic at 0.5Hz , maximal P4. Hyperventilation and photic stimulation were not performed.   ABNORMALITY - Lateralized periodic discharges, right hemisphere, maximal P4  Sharp waves, right parieto-occipital region -Continuous slow, generalized IMPRESSION: This study showed evidence of epileptogenicity arising from right parieto-occipital region due to underlying mass. LPD are on the ictal-interictal continuum with high potential for seizures. There is also mild diffuse encephalopathy, nonspecific etiology. No definite  seizures were seen throughout the recording. Lora Havens     Time coordinating discharge: Over 37 minutes    Dwyane Dee, MD  [facility] 03/21/2020, 2:44 PM

## 2020-03-22 ENCOUNTER — Other Ambulatory Visit: Payer: Self-pay

## 2020-03-22 ENCOUNTER — Inpatient Hospital Stay: Payer: Medicare Other | Attending: Internal Medicine | Admitting: Internal Medicine

## 2020-03-22 VITALS — BP 148/93 | HR 94 | Temp 97.7°F | Resp 18 | Ht 74.0 in | Wt 201.1 lb

## 2020-03-22 DIAGNOSIS — Z923 Personal history of irradiation: Secondary | ICD-10-CM | POA: Diagnosis not present

## 2020-03-22 DIAGNOSIS — Z79899 Other long term (current) drug therapy: Secondary | ICD-10-CM | POA: Insufficient documentation

## 2020-03-22 DIAGNOSIS — Z7189 Other specified counseling: Secondary | ICD-10-CM | POA: Diagnosis not present

## 2020-03-22 DIAGNOSIS — C714 Malignant neoplasm of occipital lobe: Secondary | ICD-10-CM | POA: Insufficient documentation

## 2020-03-22 DIAGNOSIS — C719 Malignant neoplasm of brain, unspecified: Secondary | ICD-10-CM | POA: Diagnosis not present

## 2020-03-22 DIAGNOSIS — Z87891 Personal history of nicotine dependence: Secondary | ICD-10-CM | POA: Diagnosis not present

## 2020-03-22 NOTE — Progress Notes (Signed)
[facility] [place], [place] [address] [phone]   Interval Evaluation  Date of Service: 03/22/20 Patient Name: [name] Patient MRN: [id number] Patient DOB: 1945-09-18 Provider: Ventura Sellers, MD  Identifying Statement:  [name] is a 74 y.o. male with right occipital glioblastoma    Referring Provider: Maurice Small, MD [address] [place],  [place] [address]  Oncologic History: Oncology History  Glioblastoma with isocitrate dehydrogenase gene wildtype ([place])  07/04/2019 Surgery   Debulking resection by Dr. Christella Noa; path demonstrates Glioblastoma   08/17/2019 - 09/28/2019 Radiation Therapy   IMRT with concurrent daily Temozolomide 75mg /m2   10/30/2019 -  Chemotherapy   The patient had temozolomide (TEMODAR) 180 MG capsule, 180 mg (100 % of original dose 180 mg), Oral, Daily, 1 of 1 cycle, Start date: 10/30/2019, End date: -- Dose modification: 180 mg (original dose 180 mg, Cycle 1) temozolomide (TEMODAR) 140 MG capsule, 140 mg, Oral, Daily, 1 of 1 cycle, Start date: 02/05/2020, End date: -- Dose modification: 140 mg (original dose 140 mg, Cycle 1), 200 mg/m2/day (original dose 140 mg, Cycle 1) temozolomide (TEMODAR) 180 MG capsule, 180 mg, Oral, Daily, 1 of 1 cycle, Start date: 02/05/2020, End date: --  for chemotherapy treatment.      Biomarkers:  MGMT Unmethylated.  IDH 1/2 Wild type.  EGFR Not expressed  TERT Unknown   Interval History:  [name] presents to clinic today following recent hospitalization for breakthrough seizures.  No recurrence of seizure events or periods of beahioral disturbance since discharge to home.  He continues on Keppra at higher dose 1500mg  twice per day, as well as decadron 4mg  twice per day.  Currently utilizing walker to ambulate. Wife and patient both acknowledge memory and cognition continue to decline as well.  Overall his left sided  vision is stable from prior.    H+P (07/13/19) Patient presented in early March 2021 with several days of progressive left sided visual impairment.  He noticed "missing things" in the left side of his field in his work and while driving.  He then developed headaches which were new for him.  Never complained of difficulty walking, speaking, using arms or hands.  CNS imaging demonstrated tumor within right occipital lobe, which was biopsied by Dr. Christella Noa on 07/04/19.  Following surgery, he has no new complaints or progressive deficits.  He may be taking decadron 4mg  twice per day.  Otherwise his functional status is only limited by visual impairment. Worked at [employer] and lives with his wife of 24 years.  Medications: Current Outpatient Medications on File Prior to Visit  Medication Sig Dispense Refill  . levETIRAcetam (KEPPRA) 750 MG tablet Take 2 tablets (1,500 mg total) by mouth 2 (two) times daily. 120 tablet 3  . amLODipine (NORVASC) 10 MG tablet Take 1 tablet (10 mg total) by mouth every evening. 30 tablet 0  . atorvastatin (LIPITOR) 80 MG tablet Take 1 tablet (80 mg total) by mouth at bedtime. 30 tablet 0  . calcium citrate (CALCITRATE - DOSED IN MG ELEMENTAL CALCIUM) 950 (200 Ca) MG tablet Take 1 tablet (200 mg of elemental calcium total) by mouth daily. 30 tablet 0  . dexamethasone (DECADRON) 2 MG tablet Take 1-2 tablets (2-4 mg total) by mouth See admin instructions. Take 1 tablet (2mg ) by mouth in the morning and 2 tablets (4mg ) by mouth in the evening.    . metoprolol succinate (  TOPROL-XL) 25 MG 24 hr tablet Take 0.5 tablets (12.5 mg total) by mouth every evening. 30 tablet 0  . ondansetron (ZOFRAN) 8 MG tablet Take 1 tablet (8 mg total) by mouth 2 (two) times daily as needed (nausea and vomiting). May take 30-60 minutes prior to Temodar administration if nausea/vomiting occurs. 30 tablet 1  . temozolomide (TEMODAR) 140 MG capsule Take 1 capsule (140 mg total) by mouth daily. May take  on an empty stomach to decrease nausea & vomiting. (Patient taking differently: Take 140 mg by mouth See admin instructions. Take 1 capsule ($RemoveBe'140mg'PgJUtYhNA$ ) by mouth daily for 5 days. May take on an empty stomach to decrease nausea & vomiting.) 5 capsule 0  . temozolomide (TEMODAR) 180 MG capsule Take 1 capsule (180 mg total) by mouth daily. May take on an empty stomach to decrease nausea & vomiting. (Patient taking differently: Take 180 mg by mouth See admin instructions. Take 1 capsule ($RemoveBe'180mg'SLrjIjfzl$ ) by mouth daily for 5 days. May take on an empty stomach to decrease nausea & vomiting.) 5 capsule 0   No current facility-administered medications on file prior to visit.    Allergies:  Allergies  Allergen Reactions  . Lisinopril Cough   Past Medical History:  Past Medical History:  Diagnosis Date  . Anxiety   . Arthritis   . ASCVD (arteriosclerotic cardiovascular disease)    03/2004: DES x2 to the RCA; IMI with stent occlusion in 02/2005- suboptimal Plavix compliance  . CAD (coronary artery disease)   . Carcinoma of prostate ([place])    Clopidogrel held for biopsy  . Constipation due to pain medication   . Diabetes mellitus without complication ([place])   . Diabetic neuropathy ([place])   . Headache 2021  . Hyperlipidemia   . Hypertension   . Mild depression ([place])   . Morbid obesity ([place])   . Myocardial infarction ([place])   . Shortness of breath dyspnea   . Sleep apnea    no longer uses cpap  . Stroke ([place] [facility]) 2017  . Tobacco abuse    stopped smoking 06/28/09   Past Surgical History:  Past Surgical History:  Procedure Laterality Date  . APPLICATION OF CRANIAL NAVIGATION N/A 07/04/2019   Procedure: APPLICATION OF CRANIAL NAVIGATION;  Surgeon: Ashok Pall, MD;  Location: [facility];  Service: Neurosurgery;  Laterality: N/A;  . BACK SURGERY    . CARDIAC CATHETERIZATION     X 1 stent before having CABG  . CORONARY ARTERY BYPASS GRAFT  06/28/2008   X4  . CRANIOTOMY Right 07/04/2019   Procedure: Right occipital  craniotomy for tumor with brainlab;  Surgeon: Ashok Pall, MD;  Location: [facility];  Service: Neurosurgery;  Laterality: Right;  . ENDARTERECTOMY Right 07/03/2015   Procedure: RIGHT CAROTID ENDARTERECTOMY WITH BOVINE PERICARDIUM PATCH ANGIOPLASTY;  Surgeon: Serafina Mitchell, MD;  Location: [facility];  Service: Vascular;  Laterality: Right;  . LUMBAR LAMINECTOMY/DECOMPRESSION MICRODISCECTOMY N/A 09/06/2014   Procedure: LUMBER DECOMPRESSION L3-5;  Surgeon: Melina Schools, MD;  Location: [facility];  Service: Orthopedics;  Laterality: N/A;  . LUMBAR SPINE SURGERY  2002  . PROSTATE BIOPSY    . PROSTATECTOMY  02/2007  . VASECTOMY     Social History:  Social History   Socioeconomic History  . Marital status: Married    Spouse name: Not on file  . Number of children: 4  . Years of education: Not on file  . Highest education level: Not on file  Occupational History  . Occupation: Technical brewer  Comment: retired  . Occupation: Designer, industrial/product: [employer]    Comment: full time  Tobacco Use  . Smoking status: Former Smoker    Packs/day: 1.00    Years: 40.00    Pack years: 40.00    Types: Cigarettes    Quit date: 06/28/2008    Years since quitting: 11.7  . Smokeless tobacco: Former Systems developer    Quit date: 06/28/2009  Vaping Use  . Vaping Use: Never used  Substance and Sexual Activity  . Alcohol use: Yes    Alcohol/week: 3.0 standard drinks    Types: 2 Glasses of wine, 1 Cans of beer per week    Comment: may have 1 drink a week  . Drug use: No  . Sexual activity: Not on file  Other Topics Concern  . Not on file  Social History Narrative   Married with 3 sons and 1 daughter   Daily caffeine use, 3 per day   Social Determinants of Health   Financial Resource Strain:   . Difficulty of Paying Living Expenses: Not on file  Food Insecurity:   . Worried About Charity fundraiser in the Last Year: Not on file  . Ran Out of Food in the Last Year: Not on file  Transportation  Needs:   . Lack of Transportation (Medical): Not on file  . Lack of Transportation (Non-Medical): Not on file  Physical Activity:   . Days of Exercise per Week: Not on file  . Minutes of Exercise per Session: Not on file  Stress:   . Feeling of Stress : Not on file  Social Connections:   . Frequency of Communication with Friends and Family: Not on file  . Frequency of Social Gatherings with Friends and Family: Not on file  . Attends Religious Services: Not on file  . Active Member of Clubs or Organizations: Not on file  . Attends Archivist Meetings: Not on file  . Marital Status: Not on file  Intimate Partner Violence:   . Fear of Current or Ex-Partner: Not on file  . Emotionally Abused: Not on file  . Physically Abused: Not on file  . Sexually Abused: Not on file   Family History:  Family History  Problem Relation Age of Onset  . Kidney disease Mother   . Stroke Father   . Breast cancer Sister        breast progressed into bone  . Colon cancer Neg Hx   . Prostate cancer Neg Hx   . Pancreatic cancer Neg Hx     Review of Systems: Constitutional: Doesn't report fevers, chills or abnormal weight loss Eyes: Doesn't report blurriness of vision Ears, nose, mouth, throat, and face: Doesn't report sore throat Respiratory: Doesn't report cough, dyspnea or wheezes Cardiovascular: Doesn't report palpitation, chest discomfort  Gastrointestinal:  Doesn't report nausea, constipation, diarrhea GU: Doesn't report incontinence Skin: Doesn't report skin rashes Neurological: Per HPI Musculoskeletal: Doesn't report joint pain Behavioral/Psych: +depression symptoms  Physical Exam: Vitals:   03/22/20 1225  BP: (!) 148/93  Pulse: 94  Resp: 18  Temp: 97.7 F (36.5 C)  SpO2: 97%   KPS: 60. General: Alert, cooperative, pleasant, in no acute distress Head: Normal EENT: No conjunctival injection or scleral icterus.  Lungs: Resp effort normal Cardiac: Regular rate Abdomen:  Non-distended abdomen Skin: No rashes cyanosis or petechiae. Extremities: mild LE edema  Neurologic Exam: Mental Status: Awake, alert, attentive to examiner. Oriented to self and environment. Language  is fluent with intact comprehension.  Poor insight , advanced psychomotor slowing. Cranial Nerves: Visual acuity is grossly normal. Left hemianopia. Extra-ocular movements intact. No ptosis. Face is symmetric Motor: Tone and bulk are normal. Pronator drift left arm, left leg 4/5 long tract pattern. Reflexes are symmetric, no pathologic reflexes present.  Sensory: Intact to light touch Gait: Dystaxic, walker assisted  Labs: I have reviewed the data as listed    Component Value Date/Time   NA 135 03/21/2020 0503   NA 139 05/04/2019 0954   K 4.6 03/21/2020 0503   CL 96 (L) 03/21/2020 0503   CO2 27 03/21/2020 0503   GLUCOSE 220 (H) 03/21/2020 0503   BUN 18 03/21/2020 0503   BUN 11 05/04/2019 0954   CREATININE 0.70 03/21/2020 0503   CREATININE 0.78 02/05/2020 1122   CALCIUM 9.2 03/21/2020 0503   PROT 6.6 03/16/2020 2103   PROT 6.8 05/04/2019 0954   ALBUMIN 3.8 03/16/2020 2103   ALBUMIN 4.5 05/04/2019 0954   AST 25 03/16/2020 2103   AST 15 02/05/2020 1122   ALT 30 03/16/2020 2103   ALT 21 02/05/2020 1122   ALKPHOS 70 03/16/2020 2103   BILITOT 1.5 (H) 03/16/2020 2103   BILITOT 1.4 (H) 02/05/2020 1122   GFRNONAA >60 03/21/2020 0503   GFRNONAA >60 02/05/2020 1122   GFRAA >60 01/02/2020 0922   Lab Results  Component Value Date   WBC 3.8 (L) 03/21/2020   NEUTROABS 3.3 03/21/2020   HGB 13.4 03/21/2020   HCT 37.5 (L) 03/21/2020   MCV 88.4 03/21/2020   PLT 112 (L) 03/21/2020   Imaging:  [facility] Clinician Interpretation: I have personally reviewed the CNS images as listed.  My interpretation, in the context of the patient's clinical presentation, is progressive disease  CT Head Wo Contrast  Result Date: 03/16/2020 CLINICAL DATA:  Initial evaluation for acute seizure, history of  right occipital glioblastoma, status post surgical debulking and XRT with concurrent Temodar. EXAM: CT HEAD WITHOUT CONTRAST TECHNIQUE: Contiguous axial images were obtained from the base of the skull through the vertex without intravenous contrast. COMPARISON:  Prior brain MRI from 03/13/2020. FINDINGS: Brain: Postoperative changes from right occipital craniotomy for tumor debulking again seen. Residual masslike opacity within the underlying right occipital lobe again seen, grossly stable from prior MRI. Progressive calcification/mineralization at the overlying right occipital cortex as compared to most recent CT from June. Probable extension across the splenium again noted, better appreciated on prior MRI. Surrounding hypodensity within the right temporal occipital region not significantly changed. Similar mass effect on the right lateral ventricle with up to 4 mm of right-to-left shift. No evidence for interval hemorrhage or other complication. Remainder the brain is otherwise stable and negative in appearance. No other acute large vessel territory infarct or hemorrhage. No extra-axial collection. Vascular: No hyperdense vessel. Scattered vascular calcifications noted within the carotid siphons. Skull: Prior right craniotomy.  No acute finding. Sinuses/Orbits: Globes and orbital soft tissues within normal limits. Paranasal sinuses are largely clear. No significant mastoid effusion. Other: None. IMPRESSION: 1. No significant interval change in appearance of patient's known glioblastoma centered at the right occipital lobe. Surrounding edema and regional mass effect with up to 4 mm of right-to-left shift relatively unchanged. No interval hemorrhage or other complication. 2. Otherwise stable and negative head CT. No other acute intracranial finding. Electronically Signed   By: Jeannine Boga M.D.   On: 03/16/2020 22:49   MR Brain W Wo Contrast  Result Date: 03/13/2020 CLINICAL DATA:  Right occipital  glioblastoma. Debulking on 07/04/2019 followed by radiation therapy and concurrent Temodar from 08/17/2019-09/28/2019. Ongoing Temodar therapy. EXAM: MRI HEAD WITHOUT AND WITH CONTRAST TECHNIQUE: Multiplanar, multiecho pulse sequences of the brain and surrounding structures were obtained without and with intravenous contrast. CONTRAST:  18mL GADAVIST GADOBUTROL 1 MMOL/ML IV SOLN COMPARISON:  12/29/2019 FINDINGS: The patient could not tolerate the complete examination. Sagittal T1 weighted postcontrast imaging was not obtained, and the coronal T1 postcontrast sequence is incomplete and motion degraded. Brain: A large, irregular, peripherally enhancing mass centered in the right occipital lobe has mildly enlarged, measuring 8.0 x 5.4 x 4.7 cm (previously 7.5 x 5.0 x 4.4 cm). There is persistent heterogeneously restricted diffusion within the mass, and numerous small foci of susceptibility artifact compatible with hemorrhage within the mass have mildly increased in number. Nonenhancing T2 hyperintensity throughout the surrounding white matter of the right occipital, temporal, and parietal lobes has mildly decreased with slightly decreased effacement of the right lateral ventricle and unchanged leftward midline shift of 4 mm. Extremely bright T2 and FLAIR signal abnormality extending leftward across the splenium of the corpus callosum has also decreased suggesting decreased edema, however less intense T2 and FLAIR signal abnormality in the corpus callosum with associated enhancement, diffusion abnormality, and masslike enlargement of the corpus callosum has progressed. A few punctate foci of T2 hyperintensity scattered elsewhere in the cerebral white matter bilaterally are unchanged and nonspecific. No acute infarct or extra-axial fluid collection is evident. Vascular: Major intracranial arterial flow voids are preserved. Filling defect consistent with thrombus in the right transverse and proximal right sigmoid sinus is  unchanged. Skull and upper cervical spine: Right occipital craniotomy. Sinuses/Orbits: Unremarkable orbits. Paranasal sinuses and mastoid air cells are clear. Other: None. IMPRESSION: Findings concerning for tumor progression mainly in the splenium of the corpus callosum. Mildly decreased edema with unchanged midline shift. Electronically Signed   By: Logan Bores M.D.   On: 03/13/2020 15:57   DG Chest Port 1 View  Result Date: 03/16/2020 CLINICAL DATA:  Unresponsive. EXAM: PORTABLE CHEST 1 VIEW COMPARISON:  July 05, 2019 FINDINGS: The heart size is enlarged. The patient is status post prior median sternotomy. The lung volumes are low. There are trace bilateral pleural effusions. Bibasilar airspace disease is noted and favored to represent passive atelectasis. There is no pneumothorax. No acute displaced fracture. Aortic calcifications are noted. IMPRESSION: 1. Low lung volumes with trace bilateral pleural effusions and bibasilar airspace disease, favored to represent passive atelectasis. 2. Cardiomegaly. Electronically Signed   By: Constance Holster M.D.   On: 03/16/2020 21:28   EEG adult  Result Date: 03/17/2020 Lora Havens, MD     03/17/2020  2:23 PM Patient Name: CELESTINE BOUGIE MRN: [id number] Epilepsy Attending: Lora Havens Referring Physician/Provider: Dr Roland Rack Date: 03/17/2020 Duration: 27.51 mins Patient history: 74 year old male presenting with focal status epilepticus in the setting of known glioblastoma. EEG to evaluate for seizure. Level of alertness: Awake, asleep AEDs during EEG study: LEV Technical aspects: This EEG study was done with scalp electrodes positioned according to the 10-20 International system of electrode placement. Electrical activity was acquired at a sampling rate of $Remov'500Hz'WjiUfm$  and reviewed with a high frequency filter of $RemoveB'70Hz'fQcXaEKf$  and a low frequency filter of $RemoveB'1Hz'oRLIiXXC$ . EEG data were recorded continuously and digitally stored. Description: No posterior  dominant rhythm was seen. Sleep was characterized by vertex waves, sleep spindles (12 to 14 Hz), maximal frontocentral region.  EEG showed continuous generalized 3 to 6 Hz theta-delta slowing with  overriding 15-'18hz'$  beta activity. Sharp waves were seen in right parieto-occipital region, at times periodic at 0.$RemoveBefor'5Hz'CBXzvyiqbIme$ , maximal P4. Hyperventilation and photic stimulation were not performed.   ABNORMALITY - Lateralized periodic discharges, right hemisphere, maximal P4  Sharp waves, right parieto-occipital region -Continuous slow, generalized IMPRESSION: This study showed evidence of epileptogenicity arising from right parieto-occipital region due to underlying mass. LPD are on the ictal-interictal continuum with high potential for seizures. There is also mild diffuse encephalopathy, nonspecific etiology. No definite  seizures were seen throughout the recording. 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  MRI demonstrates clear progression of enhancing disease along the corpus callosum.    We had an extensive discussion today regarding goals of care moving forward.  We reviewed options including salvage chemotherapy with CCNU, and transition to home hospice.  He is not candidate for clinical trials because of poor performance status at present.  Mr. Bondy will take some time this weekend to discuss with his family.  They will reach out to [place] next week regarding preferred plan of action.  May continue Keppra $RemoveBeforeD'1500mg'hRRXyfdggbLVSq$  and decadron $RemoveBefo'4mg'OCMngAPtrvB$  BID in the interim.  All questions were answered. The patient knows to call the clinic with any problems, questions or concerns. No barriers to learning were detected.  I have spent a total of 40 minutes of face-to-face and non-face-to-face time, excluding clinical staff time, preparing to see patient, ordering tests and/or  medications, counseling the patient, and independently interpreting results and communicating results to the patient/family/caregiver     Ventura Sellers, MD Medical Director of Neuro-Oncology [facility] at [place] 03/22/20 12:30 PM

## 2020-03-25 ENCOUNTER — Telehealth: Payer: Self-pay | Admitting: *Deleted

## 2020-03-25 ENCOUNTER — Inpatient Hospital Stay: Payer: Medicare Other

## 2020-03-25 NOTE — Telephone Encounter (Signed)
Patient and wife called office to advise that patient wants to proceed with CCNU oral chemotherapy.  Routed to Dr [name] to proceed with ordering and the insurance process.

## 2020-03-26 ENCOUNTER — Other Ambulatory Visit: Payer: Self-pay | Admitting: Cardiology

## 2020-03-26 ENCOUNTER — Other Ambulatory Visit: Payer: Self-pay

## 2020-03-26 MED ORDER — AMLODIPINE BESYLATE 10 MG PO TABS
10.0000 mg | ORAL_TABLET | Freq: Every evening | ORAL | 0 refills | Status: DC
Start: 2020-03-26 — End: 2020-03-27

## 2020-03-27 ENCOUNTER — Other Ambulatory Visit: Payer: Self-pay | Admitting: Cardiology

## 2020-03-28 ENCOUNTER — Other Ambulatory Visit: Payer: Self-pay | Admitting: Cardiology

## 2020-03-31 ENCOUNTER — Other Ambulatory Visit: Payer: Medicare Other

## 2020-04-05 ENCOUNTER — Other Ambulatory Visit: Payer: Self-pay | Admitting: Cardiology

## 2020-04-05 ENCOUNTER — Ambulatory Visit: Payer: Medicare Other | Admitting: Cardiology

## 2020-04-05 ENCOUNTER — Other Ambulatory Visit: Payer: Self-pay

## 2020-04-05 ENCOUNTER — Encounter: Payer: Self-pay | Admitting: Cardiology

## 2020-04-05 VITALS — BP 127/83 | HR 88 | Ht 74.0 in | Wt 195.0 lb

## 2020-04-05 DIAGNOSIS — Z87891 Personal history of nicotine dependence: Secondary | ICD-10-CM | POA: Diagnosis not present

## 2020-04-05 DIAGNOSIS — Z9889 Other specified postprocedural states: Secondary | ICD-10-CM

## 2020-04-05 DIAGNOSIS — I70213 Atherosclerosis of native arteries of extremities with intermittent claudication, bilateral legs: Secondary | ICD-10-CM

## 2020-04-05 DIAGNOSIS — Z955 Presence of coronary angioplasty implant and graft: Secondary | ICD-10-CM | POA: Diagnosis not present

## 2020-04-05 DIAGNOSIS — E782 Mixed hyperlipidemia: Secondary | ICD-10-CM | POA: Diagnosis not present

## 2020-04-05 DIAGNOSIS — I6523 Occlusion and stenosis of bilateral carotid arteries: Secondary | ICD-10-CM

## 2020-04-05 DIAGNOSIS — I2510 Atherosclerotic heart disease of native coronary artery without angina pectoris: Secondary | ICD-10-CM

## 2020-04-05 DIAGNOSIS — Z951 Presence of aortocoronary bypass graft: Secondary | ICD-10-CM | POA: Diagnosis not present

## 2020-04-05 DIAGNOSIS — I10 Essential (primary) hypertension: Secondary | ICD-10-CM

## 2020-04-05 DIAGNOSIS — Z8673 Personal history of transient ischemic attack (TIA), and cerebral infarction without residual deficits: Secondary | ICD-10-CM

## 2020-04-05 MED ORDER — METOPROLOL SUCCINATE ER 25 MG PO TB24: 12.5000 mg | ORAL_TABLET | Freq: Every evening | ORAL | 0 refills | Status: DC

## 2020-04-05 MED ORDER — ATORVASTATIN CALCIUM 80 MG PO TABS: 80.0000 mg | ORAL_TABLET | Freq: Every day | ORAL | 0 refills | Status: DC

## 2020-04-05 MED ORDER — AMLODIPINE BESYLATE 10 MG PO TABS: ORAL_TABLET | ORAL | 1 refills | Status: DC

## 2020-04-05 NOTE — Progress Notes (Signed)
[name] Date of Birth: 1945-07-22 MRN: [id number] Primary Care Provider:Webb, [name], MD Former Cardiology Providers: Jeri Lager, APRN, FNP-C Primary Cardiologist: Rex Kras, DO, [facility] (established care 04/05/2020)  Date: 04/05/20 Last Visit: 05/2019  Chief Complaint  Patient presents with  . Follow-up  . Medication Refill  . Coronary Artery Disease    HPI  [name] is a 74 y.o.  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   Patient is not the best historian and therefore is accompanied by his wife [name] at today's office visit.  He was formally under the care of Miquel Dunn and I am seeing him for the first time to reestablish care given his CAD.  Patient has an extensive coronary artery disease with prior angioplasty and stenting and has also undergone coronary artery bypass grafting surgery in 2010 and since then has been treated medically.  Patient denies any chest pain or shortness of breath since last office visit.  Unfortunately, on July 04, 2019 patient was diagnosed with glioblastoma multiform and has undergone chemo and radiation.  According to the patient's wife the cancer has resurfaced and plans to undergo additional chemo and radiation.  From a carotid disease standpoint patient is noted to have asymptomatic carotid artery stenosis on the left  side and is scheduled for a carotid duplex in December 2021.  Study has not been performed yet and patient is encouraged to have it done.  Continue current medical therapy at this time.  Patient denies any ipsilateral vision loss or contralateral focal deficits.  ALLERGIES: Allergies  Allergen Reactions  . Lisinopril Cough   MEDICATION LIST PRIOR TO VISIT: Current Outpatient Medications on File Prior to Visit  Medication Sig Dispense Refill  . dexamethasone (DECADRON) 2 MG tablet Take 1-2 tablets (2-4 mg total) by mouth See admin instructions. Take 1 tablet (2mg ) by mouth in the morning and 2 tablets (4mg ) by mouth in the evening.    . levETIRAcetam (KEPPRA) 750 MG tablet Take 2 tablets (1,500 mg total) by mouth 2 (two) times daily. 120 tablet 3  . ondansetron (ZOFRAN) 8 MG tablet Take 1 tablet (8 mg total) by mouth 2 (two) times daily as needed (nausea and vomiting). May take 30-60 minutes prior to Temodar administration if nausea/vomiting occurs. 30 tablet 1  . temozolomide (TEMODAR) 140 MG capsule Take 1 capsule (140 mg total) by mouth daily. May take on an empty stomach to decrease nausea & vomiting. (Patient taking differently: Take 140 mg by mouth See admin instructions. Take 1 capsule (140mg ) by mouth daily for 5 days. May take on an empty stomach to decrease nausea & vomiting.) 5 capsule 0  . temozolomide (TEMODAR) 180 MG capsule Take 1 capsule (180 mg total) by mouth daily. May take on an empty stomach to decrease nausea & vomiting. (Patient taking differently: Take 180 mg by mouth See admin instructions. Take 1 capsule (180mg ) by mouth daily for 5 days. May take on  an empty stomach to decrease nausea & vomiting.) 5 capsule 0  . calcium citrate (CALCITRATE - DOSED IN MG ELEMENTAL CALCIUM) 950 (200 Ca) MG tablet Take 1 tablet (200 mg of elemental calcium total) by mouth daily. 30 tablet 0   No current facility-administered medications on file prior to visit.    PAST MEDICAL HISTORY: Past  Medical History:  Diagnosis Date  . Anxiety   . Arthritis   . ASCVD (arteriosclerotic cardiovascular disease)    03/2004: DES x2 to the RCA; IMI with stent occlusion in 02/2005- suboptimal Plavix compliance  . CAD (coronary artery disease)   . Carcinoma of prostate ([place])    Clopidogrel held for biopsy  . Constipation due to pain medication   . Diabetes mellitus without complication ([place])   . Diabetic neuropathy ([place])   . Headache 2021  . Hyperlipidemia   . Hypertension   . Mild depression ([place])   . Morbid obesity ([place])   . Myocardial infarction ([place])   . Shortness of breath dyspnea   . Sleep apnea    no longer uses cpap  . Stroke ([place]) 2017  . Tobacco abuse    stopped smoking 06/28/09    PAST SURGICAL HISTORY: Past Surgical History:  Procedure Laterality Date  . APPLICATION OF CRANIAL NAVIGATION N/A 07/04/2019   Procedure: APPLICATION OF CRANIAL NAVIGATION;  Surgeon: Ashok Pall, MD;  Location: [facility];  Service: Neurosurgery;  Laterality: N/A;  . BACK SURGERY    . CARDIAC CATHETERIZATION     X 1 stent before having CABG  . CORONARY ARTERY BYPASS GRAFT  06/28/2008   X4  . CRANIOTOMY Right 07/04/2019   Procedure: Right occipital craniotomy for tumor with brainlab;  Surgeon: Ashok Pall, MD;  Location: [facility];  Service: Neurosurgery;  Laterality: Right;  . ENDARTERECTOMY Right 07/03/2015   Procedure: RIGHT CAROTID ENDARTERECTOMY WITH BOVINE PERICARDIUM PATCH ANGIOPLASTY;  Surgeon: Serafina Mitchell, MD;  Location: [facility];  Service: Vascular;  Laterality: Right;  . LUMBAR LAMINECTOMY/DECOMPRESSION MICRODISCECTOMY N/A 09/06/2014   Procedure: LUMBER DECOMPRESSION L3-5;  Surgeon: Melina Schools, MD;  Location: [facility];  Service: Orthopedics;  Laterality: N/A;  . LUMBAR SPINE SURGERY  2002  . PROSTATE BIOPSY    . PROSTATECTOMY  02/2007  . VASECTOMY      FAMILY HISTORY: The patient's family history includes Breast cancer in his sister; Kidney disease in his mother; Stroke in his  father.   SOCIAL HISTORY:  The patient  reports that he quit smoking about 11 years ago. His smoking use included cigarettes. He has a 40.00 pack-year smoking history. He quit smokeless tobacco use about 10 years ago. He reports current alcohol use of about 3.0 standard drinks of alcohol per week. He reports that he does not use drugs.  Review of Systems  Constitutional: Negative for chills and fever.  HENT: Negative for hoarse voice and nosebleeds.        Some visual loss (per wife)  Eyes: Negative for discharge, double vision and pain.  Cardiovascular: Negative for chest pain, claudication, dyspnea on exertion, leg swelling, near-syncope, orthopnea, palpitations, paroxysmal nocturnal dyspnea and syncope.  Respiratory: Negative for hemoptysis and shortness of breath.   Musculoskeletal: Negative for muscle cramps and myalgias.  Gastrointestinal: Negative for abdominal pain, constipation, diarrhea, hematemesis, hematochezia, melena, nausea and vomiting.  Neurological: Positive for seizures and weakness. Negative for dizziness, light-headedness, numbness and paresthesias.    PHYSICAL EXAM: Vitals with BMI 04/05/2020 03/22/2020 03/21/2020  Height 6\' 2"  6\' 2"  -  Weight 195 lbs 201 lbs 2 oz -  BMI 85.02 77.41 -  Systolic 287 867 672  Diastolic 83 93 87  Pulse 88 94 73    CONSTITUTIONAL: ambulates with walker, appears older than stated age, no acute distress.   SKIN: Skin is warm and dry. No rash noted. No cyanosis. No pallor. No jaundice HEAD: Normocephalic and atraumatic.  EYES: No scleral icterus MOUTH/THROAT: Moist oral membranes.  NECK: No JVD present. No thyromegaly noted. Left CEA side is well healed, no carotid bruits  LYMPHATIC: No visible cervical adenopathy.  CHEST Normal respiratory effort. No intercostal retractions  LUNGS: Clear to auscultation bilaterally. No stridor. No wheezes. No rales.  CARDIOVASCULAR: Regular rate and rhythm, positive S1-S2, no murmurs rubs or gallops  appreciated. ABDOMINAL: No apparent ascites.  EXTREMITIES: No peripheral edema, fait bilateral DP/PT.   HEMATOLOGIC: No significant bruising NEUROLOGIC: Oriented to person, place, and time. Nonfocal. Normal muscle tone.  PSYCHIATRIC: Normal mood and affect. Normal behavior. Cooperative  CARDIAC DATABASE: EKG: 04/05/2020: Normal sinus rhythm, 87 bpm, right bundle branch block, left anterior fascicular block, poor R wave progression, without underlying injury pattern.  Echocardiogram 10/27/2016: Left ventricle cavity is normal in size. Moderate concentric hypertrophy of the left ventricle. Normal global wall motion. Normal diastolic filling pattern. Calculated EF 55%. Left atrial cavity is moderately dilated. LA is much larger than the measured AP diameter at 4.1 cm. Trileaflet aortic valve with no regurgitation noted. Mild aortic valve leaflet calcification. Mild (Grade I) mitral regurgitation. Mild mitral valve leaflet calcification. Mild tricuspid regurgitation. No evidence of pulmonary hypertension. Compared to the study done on 08/29/2014 no significant change.  Exercise myoview stress 10/30/2016 1. The resting electrocardiogram demonstrated normal sinus rhythm, RBBB and no resting arrhythmias. The stress electrocardiogram was positive for ischemia with 2 mm ST depression and T inversion in V1 to V3 with peak exercise which resolved at 3 mintues into recovery. Patient exercised on Bruce protocol for 6:15 minutes and achieved 7.05METS. Stress test terminated due to dyspnea and 88 % MPHR achieved (Target HR >85%). 2. The LV is dilated both at rest and stress images. The LV end diastolic volume was 094BS.This is an abnormal myocardial perfusion imaging study demonstrating a mixture of scar plus mild ischemia in the basal inferior, basal inferolateral, mid inferior, mid inferolateral, apical inferior and apical lateral myocardial wall(s). Ischemia more prominent in the lateral wall. Gated  SPECT imaging demonstrates hypokinesis of the basal inferior, basal inferolateral, mid inferior and mid inferolateral myocardial wall(s). The left ventricular ejection fraction was calculated to be 40%. Compared to the prior study from 08/31/2014, the current study reveals no significant change. Previously Lexiscan stress performed. This is an intermediate risk study, clinical correlation recommended.  Renal artery duplex 10/20/2017: No evidence of renal artery occlusive disease in either renal artery. Normal size both kidneys with normal intrarenal vascular perfusion is noted in the right kidney. Mild diffuse plaque noted in the abdominal aorta.  Carotid artery duplex 04/05/2019: Stenosis in the right carotid artery bulb of (<50%). Stenosis in the left internal carotid artery (50-69%). Stenosis in the left external carotid artery (>50%). Antegrade right vertebral artery flow. Antegrade left vertebral artery flow. No significant change since 04/23/2015. Follow up in six months is appropriate if clinically indicated.  Lower Extremity Arterial Duplex 05/15/2019:  No hemodynamically significant stenoses are identified in the bilateral  lower extremity arterial system.  This exam reveals mildly decreased perfusion of the right lower extremity,  noted at the post tibial artery  level (ABI 0.90) and mildly decreased  perfusion of the left lower extremity, noted at the post tibial artery  level (ABI 0.93). PVR waveforms a the PT suggests diffuse disease.  Compared to 01/19/2018, ABI has mildly decreased bilaterally from normal.  LABORATORY DATA: CBC Latest Ref Rng & Units 03/21/2020 03/17/2020 03/17/2020  WBC 4.0 - 10.5 K/uL 3.8(L) 2.2(L) -  Hemoglobin 13.0 - 17.0 g/dL 13.4 14.2 12.6(L)  Hematocrit 39.0 - 52.0 % 37.5(L) 41.3 37.0(L)  Platelets 150 - 400 K/uL 112(L) 84(L) -    CMP Latest Ref Rng & Units 03/21/2020 03/17/2020 03/17/2020  Glucose 70 - 99 mg/dL 220(H) 154(H) -  BUN 8 - 23  mg/dL 18 17 -  Creatinine 0.61 - 1.24 mg/dL 0.70 0.70 -  Sodium 135 - 145 mmol/L 135 135 135  Potassium 3.5 - 5.1 mmol/L 4.6 4.0 3.9  Chloride 98 - 111 mmol/L 96(L) 99 -  CO2 22 - 32 mmol/L 27 27 -  Calcium 8.9 - 10.3 mg/dL 9.2 8.6(L) -  Total Protein 6.5 - 8.1 g/dL - - -  Total Bilirubin 0.3 - 1.2 mg/dL - - -  Alkaline Phos 38 - 126 U/L - - -  AST 15 - 41 U/L - - -  ALT 0 - 44 U/L - - -    Lipid Panel     Component Value Date/Time   CHOL 146 05/04/2019 0954   TRIG 499 (H) 07/05/2019 0340   HDL 66 05/04/2019 0954   CHOLHDL 2.2 05/04/2019 0954   CHOLHDL 2.7 06/19/2015 0625   VLDL 12 06/19/2015 0625   LDLCALC 67 05/04/2019 0954   LABVLDL 13 05/04/2019 0954    Lab Results  Component Value Date   HGBA1C 8.2 (H) 03/18/2020   HGBA1C 6.9 (H) 10/08/2019   HGBA1C 6.0 (H) 07/04/2019   No components found for: NTPROBNP Lab Results  Component Value Date   TSH 0.641 03/19/2020   TSH 1.184 09/28/2006    Cardiac Panel (last 3 results) No results for input(s): CKTOTAL, CKMB, TROPONINIHS, RELINDX in the last 72 hours.  IMPRESSION:    ICD-10-CM   1. Atherosclerosis of native artery of both lower extremities with intermittent claudication (HCC)  I70.213 EKG 12-Lead    metoprolol succinate (TOPROL-XL) 25 MG 24 hr tablet    DISCONTINUED: atorvastatin (LIPITOR) 80 MG tablet  2. Atherosclerosis of native coronary artery of native heart without angina pectoris  I25.10   3. History of coronary angioplasty with insertion of stent  Z95.5   4. Hx of CABG  Z95.1   5. Benign hypertension  I10 amLODipine (NORVASC) 10 MG tablet    metoprolol succinate (TOPROL-XL) 25 MG 24 hr tablet  6. Mixed hyperlipidemia  E78.2 DISCONTINUED: atorvastatin (LIPITOR) 80 MG tablet  7. Former smoker  Z87.891   40. History of stroke  Z86.73   9. Atherosclerosis of both carotid arteries  I65.23   10. History of CEA (carotid endarterectomy)  Z98.890      RECOMMENDATIONS: [name] is a 74 y.o. 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 Trula Slade in March 2017, OSA not on CPAP, history of recurrence prostate adenocarcinoma s/p RALP in 2008 and again recently in March 2020, recently diagnosed with glioblastoma  multiforme, advanced age.   Atherosclerosis of the native coronary arteries without angina pectoris with prior PCI and CABG:  Patient is currently asymptomatic from angina factors or congestive heart failure symptoms.  Medications reconciled.  EKG shows normal sinus rhythm without underlying ischemia or injury pattern.  Refilled metoprolol, amlodipine, and atorvastatin.  Benign essential hypertension:  Office blood pressure is very well controlled.  Continue antihypertensive medications.  Low-salt diet recommended.  Refilled medications.  Continue to monitor.   Mixed hyperlipidemia:  Refilled Lipitor.  Does not endorse myalgias.  Carotid artery atherosclerosis:  Status post right carotid endarterectomy w/ Dr. Trula Slade in March 2017.   Patient is due for carotid duplex in December 2021.  Recommended aspirin and statin therapy.  From cardiovascular standpoint patient is asymptomatic.  Patient has reoccurrence of glioblastoma multiforme as per the patient's wife and plans to undergo chemoradiation again.   FINAL MEDICATION LIST END OF ENCOUNTER: Meds ordered this encounter  Medications  . amLODipine (NORVASC) 10 MG tablet    Sig: TAKE 1 TABLET(10 MG) BY MOUTH EVERY EVENING    Dispense:  90 tablet    Refill:  1    **Patient requests 90 days supply**  . DISCONTD: atorvastatin (LIPITOR) 80 MG tablet    Sig: Take 1 tablet (80 mg total) by mouth at  bedtime.    Dispense:  30 tablet    Refill:  0  . metoprolol succinate (TOPROL-XL) 25 MG 24 hr tablet    Sig: Take 0.5 tablets (12.5 mg total) by mouth every evening.    Dispense:  30 tablet    Refill:  0     Current Outpatient Medications:  .  dexamethasone (DECADRON) 2 MG tablet, Take 1-2 tablets (2-4 mg total) by mouth See admin instructions. Take 1 tablet (2mg ) by mouth in the morning and 2 tablets (4mg ) by mouth in the evening., Disp: , Rfl:  .  levETIRAcetam (KEPPRA) 750 MG tablet, Take 2 tablets (1,500 mg total) by mouth 2 (two) times daily., Disp: 120 tablet, Rfl: 3 .  ondansetron (ZOFRAN) 8 MG tablet, Take 1 tablet (8 mg total) by mouth 2 (two) times daily as needed (nausea and vomiting). May take 30-60 minutes prior to Temodar administration if nausea/vomiting occurs., Disp: 30 tablet, Rfl: 1 .  temozolomide (TEMODAR) 140 MG capsule, Take 1 capsule (140 mg total) by mouth daily. May take on an empty stomach to decrease nausea & vomiting. (Patient taking differently: Take 140 mg by mouth See admin instructions. Take 1 capsule (140mg ) by mouth daily for 5 days. May take on an empty stomach to decrease nausea & vomiting.), Disp: 5 capsule, Rfl: 0 .  temozolomide (TEMODAR) 180 MG capsule, Take 1 capsule (180 mg total) by mouth daily. May take on an empty stomach to decrease nausea & vomiting. (Patient taking differently: Take 180 mg by mouth See admin instructions. Take 1 capsule (180mg ) by mouth daily for 5 days. May take on an empty stomach to decrease nausea & vomiting.), Disp: 5 capsule, Rfl: 0 .  amLODipine (NORVASC) 10 MG tablet, TAKE 1 TABLET(10 MG) BY MOUTH EVERY EVENING, Disp: 90 tablet, Rfl: 1 .  atorvastatin (LIPITOR) 80 MG tablet, TAKE 1 TABLET(80 MG) BY MOUTH AT BEDTIME, Disp: 90 tablet, Rfl: 1 .  calcium citrate (CALCITRATE - DOSED IN MG ELEMENTAL CALCIUM) 950 (200 Ca) MG tablet, Take 1 tablet (200 mg of elemental calcium total) by mouth daily., Disp: 30 tablet, Rfl: 0 .   metoprolol succinate (TOPROL-XL) 25 MG 24 hr  tablet, Take 0.5 tablets (12.5 mg total) by mouth every evening., Disp: 30 tablet, Rfl: 0  Orders Placed This Encounter  Procedures  . EKG 12-Lead     --Continue cardiac medications as reconciled in final medication list. --Return in about 6 months (around 10/04/2020) for Follow up, CAD & caortid disease. Or sooner if needed. --Continue follow-up with your primary care physician regarding the management of your other chronic comorbid conditions.  Patient's questions and concerns were addressed to his satisfaction. He voices understanding of the instructions provided during this encounter.   This note was created using a voice recognition software as a result there may be grammatical errors inadvertently enclosed that do not reflect the nature of this encounter. Every attempt is made to correct such errors.  Total time spent: 35 minutes reviewing prior office notes, diagnostic testing results including echo, stress test, lower extremity arterial duplex, carotid duplex, discussing disease management, coordination of care, medication refills.   Rex Kras, [place], [facility]  Pager: [phone] Office: [phone]

## 2020-04-07 ENCOUNTER — Other Ambulatory Visit: Payer: Self-pay | Admitting: Cardiology

## 2020-04-07 DIAGNOSIS — I10 Essential (primary) hypertension: Secondary | ICD-10-CM

## 2020-04-07 DIAGNOSIS — I70213 Atherosclerosis of native arteries of extremities with intermittent claudication, bilateral legs: Secondary | ICD-10-CM

## 2020-04-08 ENCOUNTER — Telehealth: Payer: Self-pay | Admitting: Pharmacist

## 2020-04-08 ENCOUNTER — Other Ambulatory Visit: Payer: Self-pay | Admitting: Internal Medicine

## 2020-04-08 ENCOUNTER — Telehealth: Payer: Self-pay

## 2020-04-08 ENCOUNTER — Telehealth: Payer: Self-pay | Admitting: *Deleted

## 2020-04-08 ENCOUNTER — Other Ambulatory Visit: Payer: Self-pay | Admitting: *Deleted

## 2020-04-08 DIAGNOSIS — C719 Malignant neoplasm of brain, unspecified: Secondary | ICD-10-CM

## 2020-04-08 MED ORDER — LOMUSTINE 100 MG PO CAPS: 200.0000 mg | ORAL_CAPSULE | Freq: Once | ORAL | 0 refills | Status: AC

## 2020-04-08 MED ORDER — LOMUSTINE 40 MG PO CAPS: 40.0000 mg | ORAL_CAPSULE | Freq: Once | ORAL | 0 refills | Status: AC

## 2020-04-08 NOTE — Telephone Encounter (Signed)
Oral Oncology Pharmacist Encounter  Received new prescription for Gleostine (lomustine) for the treatment of glioblastoma, planned duration until disease progression or unacceptable drug toxicity.  Prescription dose and frequency assessed for appropriateness. Appropriate for therapy initiation.   CBC w/ Diff and BMP from 03/21/20 assessed, noted pltc of 112 K/uL (up from 84 K/uL on 03/17/20) - pt will have repeat labs after therapy initiation.  Current medication list in Epic reviewed, no relevant/significant DDIs with Gleostine identified.  Evaluated chart and no patient barriers to medication adherence noted.   Prescription has been e-scribed to the [facility] for benefits analysis and approval.  Oral Oncology Clinic will continue to follow for insurance authorization, copayment issues, initial counseling and start date.  Leron Croak, PharmD, BCPS Hematology/Oncology Clinical Pharmacist [facility] [phone] 04/08/2020 12:53 PM

## 2020-04-08 NOTE — Telephone Encounter (Signed)
Patients wife wanting to find out the timeline for when the Gleostine would be approved.  She is mainly asking because the patient has had some changes that she is unsure how to navigate.  She is finding that the patient is more agitated than he normally would be and wasn't sure if that was the disease process or medications.  He is also having worsening short term memory issues, progressively fatigued.    Ordered social work as the wife appears to need emotional support and encouragement and guidance on how to support him best.  She also wanted feedback from [name] on what kind of further changes she could expect.    Routed to Dr [name] to advise if this would warrant an office visit/phone visit or something else.

## 2020-04-08 NOTE — Progress Notes (Signed)
DISCONTINUE ON PATHWAY REGIMEN - Neuro     A cycle is every 28 days:     Temozolomide      Temozolomide   **Always confirm dose/schedule in your pharmacy ordering system**  REASON: Disease Progression PRIOR TREATMENT: [id number]: Temozolomide 150/200 mg/m2 PO D1-5 q28 Days x 6-12 Cycles TREATMENT RESPONSE: Progressive Disease (PD)  START ON PATHWAY REGIMEN - Neuro     A cycle is every 42 days:     Lomustine   **Always confirm dose/schedule in your pharmacy ordering system**  Patient Characteristics: Glioblastoma (Grade IV Glioma), Recurrent or Progressive, Nonsurgical Candidate, Systemic Therapy Candidate Disease Classification: Glioma Disease Classification: Glioblastoma (Grade IV Glioma) Disease Status: Recurrent or Progressive Treatment Classification: Nonsurgical Candidate Treatment (Nonsurgical/Adjuvant): Systemic Therapy Candidate Intent of Therapy: Non-Curative / Palliative Intent, Discussed with Patient

## 2020-04-08 NOTE — Telephone Encounter (Signed)
Oral Oncology Patient Advocate Encounter  Received notification from [place] that prior authorization for Lomustine is required.  PA submitted on CoverMyMeds Key [id number] Status is pending  Oral Oncology Clinic will continue to follow.  [place] Patient [place] Phone [phone] Fax [phone] 04/08/2020 12:56 PM

## 2020-04-09 ENCOUNTER — Inpatient Hospital Stay (HOSPITAL_BASED_OUTPATIENT_CLINIC_OR_DEPARTMENT_OTHER): Payer: Medicare Other | Admitting: Internal Medicine

## 2020-04-09 ENCOUNTER — Encounter: Payer: Self-pay | Admitting: *Deleted

## 2020-04-09 DIAGNOSIS — C719 Malignant neoplasm of brain, unspecified: Secondary | ICD-10-CM

## 2020-04-09 NOTE — Progress Notes (Signed)
I connected with [name] on 04/09/20 at  3:30 PM EST by telephone visit and verified that I am speaking with the correct person using two identifiers.  I discussed the limitations, risks, security and privacy concerns of performing an evaluation and management service by telemedicine and the availability of in-person appointments. I also discussed with the patient that there may be a patient responsible charge related to this service. The patient expressed understanding and agreed to proceed.  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  He is increasingly fatigued, lethargic, sleeping more.  He is less interested in conversation and daily activity, and short term memory has worsened in past few weeks.  Otherwise he remains able to walk and communicate his needs.  Denies depression symptoms.  No further seizures, no headache or pain complaints.  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 and his wife, given progressive disease on recent brain MRI and ongoing clinical changes.   For now, they would still like to move forward with CCNU dosing if it can be approved by insurance.  They are unable to bear the burden of out of pocket costs.  We also discussed home hospice, and this is something they are considering if chemotherapy options are not feasible or affordable.   If CCNU dosing is arranged, we would like to see him in clinic during mid cycle, and at end of cycle with an MRI for review.  Follow Up Instructions: RTC in 3-4  weeks with labs for evaluation  I discussed the assessment and treatment plan with the patient.  The patient was provided an opportunity to ask questions and all were answered.  The patient agreed with the plan and demonstrated understanding of the instructions.    The patient was advised to call back or seek an in-person evaluation if the symptoms worsen or if the condition fails to improve as anticipated.  I provided 5-10 minutes of non-face-to-face time during this enocunter.  Ventura Sellers, MD   I provided 25 minutes of non face-to-face telephone visit time during this encounter, and > 50% was spent counseling as documented under my assessment & plan.

## 2020-04-09 NOTE — Progress Notes (Signed)
[facility]  Clinical Social Work was referred by neuro-oncology RN for assessment of psychosocial needs; specifically caregiver support.  Clinical Social Worker contacted caregiver by phone  to offer support and assess for needs.  Patient's spouse, [name], provided information regarding patient's condition, family support system, and current stressors.  CSW and caregiver discussed burdens associated with care giving, quality of life needs of patient, coping strategies, and the uncertainties associated with cancer diagnosis.  CSW validated and normalized feelings and encouraged patient's spouse to process/verbalize her needs.  CSW will follow up with caregiver next week.    Gwinda Maine, LCSW  Clinical Social Worker [facility]

## 2020-04-15 ENCOUNTER — Other Ambulatory Visit: Payer: Self-pay | Admitting: Internal Medicine

## 2020-04-15 MED ORDER — LOMUSTINE 40 MG PO CAPS: 40.0000 mg | ORAL_CAPSULE | Freq: Once | ORAL | 0 refills | Status: AC

## 2020-04-15 MED ORDER — LOMUSTINE 100 MG PO CAPS: 200.0000 mg | ORAL_CAPSULE | Freq: Once | ORAL | 0 refills | Status: AC

## 2020-04-16 ENCOUNTER — Telehealth: Payer: Self-pay

## 2020-04-16 NOTE — Telephone Encounter (Signed)
Oral Oncology Patient Advocate Encounter  Met patient in lobby to complete application for [employer] in an effort to reduce patient's out of pocket expense for Gleostine to $0.    Application completed and faxed to [phone].   [employer] patient assistance phone number for follow up is [phone].   This encounter will be updated until final determination.    [place] [facility] [place] Phone [phone] Fax [phone] 04/16/2020 10:31 AM

## 2020-04-16 NOTE — Telephone Encounter (Signed)
Patient is approved for Gleostine at no charge from [employer] x6 fills/cycles beginning 04/15/20.  [employer] mails meds directly to physician's office.  Cruzita Lederer CPHT Specialty Pharmacy Patient Advocate [facility] Phone [phone] Fax [phone] 04/16/2020 10:33 AM

## 2020-04-17 NOTE — Telephone Encounter (Signed)
Oral Chemotherapy Pharmacist Encounter  I spoke with patient and his wife for overview of: lomustine for the treatment of glioblastoma multiforme, planned duration until disease progression or unacceptable toxicity.  Counseled patient on administration, dosing, side effects, monitoring, drug-food interactions, safe handling, storage, and disposal.  Patient will take lomustine 100mg  capsules, (2 capsules) and lomustine 40mg  capsules (1 capsule) (240mg  total) by mouth once daily, may take at bedtime and on an empty stomach to decrease nausea and vomiting.  Lomustine will be administered once every 6 weeks.  Lomustine start date: pending patient pick up from office   Patient will take Zofran 8mg  tablet, 1 tablet by mouth 30-60 min prior to lomustine dose to help decrease N/V. Confirmed with patient's wife that they have Zofran available at home.    Adverse effects include but are not limited to: nausea, vomiting, stomatitis, alopecia, decreased blood counts, alopecia, hepatotoxicity, and pulmonary toxicity.  Reviewed with patient importance of keeping a medication schedule and plan for any missed doses. No barriers to medication adherence identified.  Medication reconciliation performed and medication/allergy list updated.  Patient approved for manufacturer assistance to receive lomustine at no cost through [employer].   All questions answered.  Mr. And Ms. Ruder voiced understanding and appreciation.   Medication education handout placed in mail for patient. Patient knows to call the office with questions or concerns. Oral Chemotherapy Clinic phone number provided to patient.   Leron Croak, PharmD, BCPS Hematology/Oncology Clinical Pharmacist [facility] [phone] 04/17/2020 3:19 PM

## 2020-04-18 ENCOUNTER — Telehealth: Payer: Self-pay | Admitting: Internal Medicine

## 2020-04-18 ENCOUNTER — Telehealth: Payer: Self-pay

## 2020-04-18 NOTE — Telephone Encounter (Signed)
The Patient was due to begin CCNU but received confirmation that he tested positive for COVID yesterday evening. Per Dr. Barbaraann Cao, I have contacted the Pt to inform him that he will need to hold off on the Chemo for now and he is to be seen in 14 days with labs. I also notified [email or web address] in hopes to get the Patient scheduled for a Monoclonal antibody infusion. The patient and his wife are aware of this information and verbalized understanding.

## 2020-04-18 NOTE — Telephone Encounter (Signed)
Scheduled appt per 12/30 sch msg - pt is aware of apt date and time

## 2020-04-19 ENCOUNTER — Other Ambulatory Visit: Payer: Self-pay | Admitting: Physician Assistant

## 2020-04-19 ENCOUNTER — Ambulatory Visit (HOSPITAL_COMMUNITY)
Admission: RE | Admit: 2020-04-19 | Discharge: 2020-04-19 | Disposition: A | Discharge status: Home / Self Care | Payer: Medicare Other | Source: Ambulatory Visit | Attending: Pulmonary Disease | Admitting: Pulmonary Disease

## 2020-04-19 DIAGNOSIS — U071 COVID-19: Secondary | ICD-10-CM | POA: Insufficient documentation

## 2020-04-19 DIAGNOSIS — Z8673 Personal history of transient ischemic attack (TIA), and cerebral infarction without residual deficits: Secondary | ICD-10-CM

## 2020-04-19 DIAGNOSIS — Z23 Encounter for immunization: Secondary | ICD-10-CM | POA: Diagnosis not present

## 2020-04-19 DIAGNOSIS — C61 Malignant neoplasm of prostate: Secondary | ICD-10-CM

## 2020-04-19 DIAGNOSIS — Z951 Presence of aortocoronary bypass graft: Secondary | ICD-10-CM

## 2020-04-19 DIAGNOSIS — E119 Type 2 diabetes mellitus without complications: Secondary | ICD-10-CM

## 2020-04-19 DIAGNOSIS — I10 Essential (primary) hypertension: Secondary | ICD-10-CM

## 2020-04-19 DIAGNOSIS — C719 Malignant neoplasm of brain, unspecified: Secondary | ICD-10-CM

## 2020-04-19 DIAGNOSIS — I5032 Chronic diastolic (congestive) heart failure: Secondary | ICD-10-CM

## 2020-04-19 DIAGNOSIS — I2510 Atherosclerotic heart disease of native coronary artery without angina pectoris: Secondary | ICD-10-CM

## 2020-04-19 DIAGNOSIS — Z9889 Other specified postprocedural states: Secondary | ICD-10-CM

## 2020-04-19 MED ORDER — SODIUM CHLORIDE 0.9 % IV SOLN: INTRAVENOUS | Status: DC | PRN

## 2020-04-19 MED ORDER — FAMOTIDINE IN NACL 20-0.9 MG/50ML-% IV SOLN: 20.0000 mg | Freq: Once | INTRAVENOUS | Status: DC | PRN

## 2020-04-19 MED ORDER — SODIUM CHLORIDE 0.9 % IV SOLN: Freq: Once | INTRAVENOUS | Status: AC

## 2020-04-19 MED ORDER — EPINEPHRINE 0.3 MG/0.3ML IJ SOAJ: 0.3000 mg | Freq: Once | INTRAMUSCULAR | Status: DC | PRN

## 2020-04-19 MED ORDER — METHYLPREDNISOLONE SODIUM SUCC 125 MG IJ SOLR: 125.0000 mg | Freq: Once | INTRAMUSCULAR | Status: DC | PRN

## 2020-04-19 MED ORDER — ALBUTEROL SULFATE HFA 108 (90 BASE) MCG/ACT IN AERS: 2.0000 | INHALATION_SPRAY | Freq: Once | RESPIRATORY_TRACT | Status: DC | PRN

## 2020-04-19 MED ORDER — DIPHENHYDRAMINE HCL 50 MG/ML IJ SOLN: 50.0000 mg | Freq: Once | INTRAMUSCULAR | Status: DC | PRN

## 2020-04-19 NOTE — Discharge Instructions (Signed)
10 Things You Can Do to Manage Your COVID-19 Symptoms at Home If you have possible or confirmed COVID-19: 1. Stay home from work and school. And stay away from other public places. If you must go out, avoid using any kind of public transportation, ridesharing, or taxis. 2. Monitor your symptoms carefully. If your symptoms get worse, call your healthcare provider immediately. 3. Get rest and stay hydrated. 4. If you have a medical appointment, call the healthcare provider ahead of time and tell them that you have or may have COVID-19. 5. For medical emergencies, call 911 and notify the dispatch personnel that you have or may have COVID-19. 6. Cover your cough and sneezes with a tissue or use the inside of your elbow. 7. Wash your hands often with soap and water for at least 20 seconds or clean your hands with an alcohol-based hand sanitizer that contains at least 60% alcohol. 8. As much as possible, stay in a specific room and away from other people in your home. Also, you should use a separate bathroom, if available. If you need to be around other people in or outside of the home, wear a mask. 9. Avoid sharing personal items with other people in your household, like dishes, towels, and bedding. 10. Clean all surfaces that are touched often, like counters, tabletops, and doorknobs. Use household cleaning sprays or wipes according to the label instructions. cdc.gov/coronavirus 10/19/2018 This information is not intended to replace advice given to you by your health care provider. Make sure you discuss any questions you have with your health care provider. Document Revised: 03/23/2019 Document Reviewed: 03/23/2019 Elsevier Patient Education  2020 Elsevier Inc. What types of side effects do monoclonal antibody drugs cause?  Common side effects  In general, the more common side effects caused by monoclonal antibody drugs include: . Allergic reactions, such as hives or itching . Flu-like signs and  symptoms, including chills, fatigue, fever, and muscle aches and pains . Nausea, vomiting . Diarrhea . Skin rashes . Low blood pressure   The CDC is recommending patients who receive monoclonal antibody treatments wait at least 90 days before being vaccinated.  Currently, there are no data on the safety and efficacy of mRNA COVID-19 vaccines in persons who received monoclonal antibodies or convalescent plasma as part of COVID-19 treatment. Based on the estimated half-life of such therapies as well as evidence suggesting that reinfection is uncommon in the 90 days after initial infection, vaccination should be deferred for at least 90 days, as a precautionary measure until additional information becomes available, to avoid interference of the antibody treatment with vaccine-induced immune responses. If you have any questions or concerns after the infusion please call the Advanced Practice Provider on call at [phone]. This number is ONLY intended for your use regarding questions or concerns about the infusion post-treatment side-effects.  Please do not provide this number to others for use. For return to work notes please contact your primary care provider.   If someone you know is interested in receiving treatment please have them call the COVID hotline at [phone].   

## 2020-04-19 NOTE — Progress Notes (Signed)
Patient reviewed Fact Sheet for Patients, Parents, and Caregivers for Emergency Use Authorization (EUA) of REGEN-COV for the Treatment of Coronavirus. Patient also reviewed and is agreeable to the estimated cost of treatment. Patient is agreeable to proceed.    

## 2020-04-19 NOTE — Progress Notes (Signed)
  Diagnosis: COVID-19  Physician: Asencion Noble, MD  Procedure: Covid Infusion Clinic Med: casirivimab\imdevimab infusion - Provided patient with casirivimab\imdevimab fact sheet for patients, parents and caregivers prior to infusion.  Complications: No immediate complications noted.  Discharge: Discharged home   [name] 04/19/2020

## 2020-04-19 NOTE — Progress Notes (Signed)
I connected by phone with [name] on 04/19/2020 at 10:15 AM to discuss the potential use of a new treatment for mild to moderate COVID-19 viral infection in non-hospitalized patients.  This patient is a 74 y.o. male that meets the FDA criteria for Emergency Use Authorization of COVID monoclonal antibody casirivimab/imdevimab, bamlanivimab/etesevimab, or sotrovimab.  Has a (+) direct SARS-CoV-2 viral test result  Has mild or moderate COVID-19   Is NOT hospitalized due to COVID-19  Is within 10 days of symptom onset  Has at least one of the high risk factor(s) for progression to severe COVID-19 and/or hospitalization as defined in EUA.  Specific high risk criteria : Older age (>/= 74 yo), BMI > 25, Diabetes, Immunosuppressive Disease or Treatment, Cardiovascular disease or hypertension and Neurodevelopmental disorder   I have spoken and communicated the following to the patient or parent/caregiver regarding COVID monoclonal antibody treatment:  1. FDA has authorized the emergency use for the treatment of mild to moderate COVID-19 in adults and pediatric patients with positive results of direct SARS-CoV-2 viral testing who are 45 years of age and older weighing at least 40 kg, and who are at high risk for progressing to severe COVID-19 and/or hospitalization.  2. The significant known and potential risks and benefits of COVID monoclonal antibody, and the extent to which such potential risks and benefits are unknown.  3. Information on available alternative treatments and the risks and benefits of those alternatives, including clinical trials.  4. Patients treated with COVID monoclonal antibody should continue to self-isolate and use infection control measures (e.g., wear mask, isolate, social distance, avoid sharing personal items, clean and disinfect "high touch" surfaces, and frequent handwashing) according to CDC guidelines.   5. The patient or parent/caregiver has the option to  accept or refuse COVID monoclonal antibody treatment.  After reviewing this information with the patient, the patient has agreed to receive one of the available covid 19 monoclonal antibodies and will be provided an appropriate fact sheet prior to infusion.   [place], [place] 04/19/2020 10:15 AM

## 2020-04-23 ENCOUNTER — Telehealth: Payer: Self-pay | Admitting: Internal Medicine

## 2020-04-23 NOTE — Telephone Encounter (Signed)
Left message with rescheduled appointment due to COVID+ 12/31 per 1/4 schedule message. Gave option to call back to reschedule if needed.

## 2020-04-26 ENCOUNTER — Telehealth: Payer: Self-pay

## 2020-04-26 NOTE — Telephone Encounter (Signed)
The Patient's wife called this morning reporting that the Pt has developed a slight cough with congestion. She did confirm that he is not experiencing any other symptoms and that there is no fever present. She wanted to know if [name] would send in an Rx for him or if there was something that she could get OTC. Per [name] I informed the wife to get the Patient Mucinex DM expectorant and cough suppressant. I also informed her that the Patient will need to seek immediate care if he develops SOB or any changes in symptoms given his COVID positive status. She verbalized understanding.

## 2020-05-02 ENCOUNTER — Other Ambulatory Visit: Payer: Medicare Other

## 2020-05-02 ENCOUNTER — Ambulatory Visit: Payer: Medicare Other | Admitting: Internal Medicine

## 2020-05-06 ENCOUNTER — Emergency Department (HOSPITAL_COMMUNITY): Payer: Medicare Other

## 2020-05-06 ENCOUNTER — Encounter (HOSPITAL_COMMUNITY): Payer: Self-pay

## 2020-05-06 ENCOUNTER — Inpatient Hospital Stay (HOSPITAL_COMMUNITY)
Admission: EM | Admit: 2020-05-06 | Discharge: 2020-05-13 | DRG: 100 | Disposition: A | Discharge status: Rehabilitation Facility | Payer: Medicare Other | Attending: Internal Medicine | Admitting: Internal Medicine

## 2020-05-06 ENCOUNTER — Other Ambulatory Visit: Payer: Self-pay

## 2020-05-06 DIAGNOSIS — I5032 Chronic diastolic (congestive) heart failure: Secondary | ICD-10-CM | POA: Diagnosis present

## 2020-05-06 DIAGNOSIS — G40919 Epilepsy, unspecified, intractable, without status epilepticus: Secondary | ICD-10-CM

## 2020-05-06 DIAGNOSIS — I252 Old myocardial infarction: Secondary | ICD-10-CM | POA: Diagnosis not present

## 2020-05-06 DIAGNOSIS — G40201 Localization-related (focal) (partial) symptomatic epilepsy and epileptic syndromes with complex partial seizures, not intractable, with status epilepticus: Secondary | ICD-10-CM | POA: Diagnosis not present

## 2020-05-06 DIAGNOSIS — G40901 Epilepsy, unspecified, not intractable, with status epilepticus: Principal | ICD-10-CM | POA: Diagnosis present

## 2020-05-06 DIAGNOSIS — G9340 Encephalopathy, unspecified: Secondary | ICD-10-CM | POA: Diagnosis present

## 2020-05-06 DIAGNOSIS — Z951 Presence of aortocoronary bypass graft: Secondary | ICD-10-CM

## 2020-05-06 DIAGNOSIS — Z85841 Personal history of malignant neoplasm of brain: Secondary | ICD-10-CM | POA: Diagnosis not present

## 2020-05-06 DIAGNOSIS — J9601 Acute respiratory failure with hypoxia: Secondary | ICD-10-CM | POA: Diagnosis present

## 2020-05-06 DIAGNOSIS — Z803 Family history of malignant neoplasm of breast: Secondary | ICD-10-CM

## 2020-05-06 DIAGNOSIS — I2510 Atherosclerotic heart disease of native coronary artery without angina pectoris: Secondary | ICD-10-CM | POA: Diagnosis present

## 2020-05-06 DIAGNOSIS — Z8546 Personal history of malignant neoplasm of prostate: Secondary | ICD-10-CM

## 2020-05-06 DIAGNOSIS — E785 Hyperlipidemia, unspecified: Secondary | ICD-10-CM | POA: Diagnosis present

## 2020-05-06 DIAGNOSIS — Z87891 Personal history of nicotine dependence: Secondary | ICD-10-CM

## 2020-05-06 DIAGNOSIS — E1140 Type 2 diabetes mellitus with diabetic neuropathy, unspecified: Secondary | ICD-10-CM | POA: Diagnosis present

## 2020-05-06 DIAGNOSIS — T380X5A Adverse effect of glucocorticoids and synthetic analogues, initial encounter: Secondary | ICD-10-CM | POA: Diagnosis not present

## 2020-05-06 DIAGNOSIS — Z8616 Personal history of COVID-19: Secondary | ICD-10-CM | POA: Diagnosis not present

## 2020-05-06 DIAGNOSIS — I110 Hypertensive heart disease with heart failure: Secondary | ICD-10-CM | POA: Diagnosis present

## 2020-05-06 DIAGNOSIS — J9811 Atelectasis: Secondary | ICD-10-CM | POA: Diagnosis not present

## 2020-05-06 DIAGNOSIS — Z7952 Long term (current) use of systemic steroids: Secondary | ICD-10-CM

## 2020-05-06 DIAGNOSIS — L89152 Pressure ulcer of sacral region, stage 2: Secondary | ICD-10-CM | POA: Diagnosis present

## 2020-05-06 DIAGNOSIS — R1310 Dysphagia, unspecified: Secondary | ICD-10-CM | POA: Diagnosis not present

## 2020-05-06 DIAGNOSIS — Z0184 Encounter for antibody response examination: Secondary | ICD-10-CM

## 2020-05-06 DIAGNOSIS — Z9981 Dependence on supplemental oxygen: Secondary | ICD-10-CM | POA: Diagnosis not present

## 2020-05-06 DIAGNOSIS — Z841 Family history of disorders of kidney and ureter: Secondary | ICD-10-CM | POA: Diagnosis not present

## 2020-05-06 DIAGNOSIS — I10 Essential (primary) hypertension: Secondary | ICD-10-CM | POA: Diagnosis present

## 2020-05-06 DIAGNOSIS — R5383 Other fatigue: Secondary | ICD-10-CM | POA: Diagnosis not present

## 2020-05-06 DIAGNOSIS — M1990 Unspecified osteoarthritis, unspecified site: Secondary | ICD-10-CM | POA: Diagnosis present

## 2020-05-06 DIAGNOSIS — N390 Urinary tract infection, site not specified: Secondary | ICD-10-CM | POA: Diagnosis not present

## 2020-05-06 DIAGNOSIS — L8990 Pressure ulcer of unspecified site, unspecified stage: Secondary | ICD-10-CM | POA: Insufficient documentation

## 2020-05-06 DIAGNOSIS — G9341 Metabolic encephalopathy: Secondary | ICD-10-CM | POA: Diagnosis present

## 2020-05-06 DIAGNOSIS — Z978 Presence of other specified devices: Secondary | ICD-10-CM

## 2020-05-06 DIAGNOSIS — Z79899 Other long term (current) drug therapy: Secondary | ICD-10-CM

## 2020-05-06 DIAGNOSIS — G40909 Epilepsy, unspecified, not intractable, without status epilepticus: Secondary | ICD-10-CM | POA: Diagnosis not present

## 2020-05-06 DIAGNOSIS — Z823 Family history of stroke: Secondary | ICD-10-CM | POA: Diagnosis not present

## 2020-05-06 DIAGNOSIS — Z9079 Acquired absence of other genital organ(s): Secondary | ICD-10-CM | POA: Diagnosis not present

## 2020-05-06 DIAGNOSIS — F419 Anxiety disorder, unspecified: Secondary | ICD-10-CM | POA: Diagnosis present

## 2020-05-06 DIAGNOSIS — Z888 Allergy status to other drugs, medicaments and biological substances status: Secondary | ICD-10-CM | POA: Diagnosis not present

## 2020-05-06 DIAGNOSIS — U071 COVID-19: Secondary | ICD-10-CM | POA: Diagnosis not present

## 2020-05-06 DIAGNOSIS — R739 Hyperglycemia, unspecified: Secondary | ICD-10-CM | POA: Diagnosis not present

## 2020-05-06 DIAGNOSIS — G9389 Other specified disorders of brain: Secondary | ICD-10-CM | POA: Diagnosis not present

## 2020-05-06 DIAGNOSIS — E1165 Type 2 diabetes mellitus with hyperglycemia: Secondary | ICD-10-CM | POA: Diagnosis not present

## 2020-05-06 DIAGNOSIS — R0902 Hypoxemia: Secondary | ICD-10-CM | POA: Diagnosis present

## 2020-05-06 DIAGNOSIS — R5381 Other malaise: Secondary | ICD-10-CM | POA: Diagnosis not present

## 2020-05-06 DIAGNOSIS — R531 Weakness: Secondary | ICD-10-CM | POA: Diagnosis present

## 2020-05-06 DIAGNOSIS — C714 Malignant neoplasm of occipital lobe: Secondary | ICD-10-CM | POA: Diagnosis present

## 2020-05-06 DIAGNOSIS — C719 Malignant neoplasm of brain, unspecified: Secondary | ICD-10-CM | POA: Diagnosis not present

## 2020-05-06 DIAGNOSIS — G40509 Epileptic seizures related to external causes, not intractable, without status epilepticus: Secondary | ICD-10-CM | POA: Diagnosis not present

## 2020-05-06 DIAGNOSIS — H9190 Unspecified hearing loss, unspecified ear: Secondary | ICD-10-CM | POA: Diagnosis present

## 2020-05-06 DIAGNOSIS — R569 Unspecified convulsions: Secondary | ICD-10-CM | POA: Diagnosis not present

## 2020-05-06 LAB — CBC WITH DIFFERENTIAL/PLATELET
Abs Immature Granulocytes: 0.28 10*3/uL — ABNORMAL HIGH (ref 0.00–0.07)
Basophils Absolute: 0 10*3/uL (ref 0.0–0.1)
Basophils Relative: 0 %
Eosinophils Absolute: 0 10*3/uL (ref 0.0–0.5)
Eosinophils Relative: 0 %
HCT: 43.3 % (ref 39.0–52.0)
Hemoglobin: 13.9 g/dL (ref 13.0–17.0)
Immature Granulocytes: 5 %
Lymphocytes Relative: 12 %
Lymphs Abs: 0.7 10*3/uL (ref 0.7–4.0)
MCH: 29.6 pg (ref 26.0–34.0)
MCHC: 32.1 g/dL (ref 30.0–36.0)
MCV: 92.1 fL (ref 80.0–100.0)
Monocytes Absolute: 0.5 10*3/uL (ref 0.1–1.0)
Monocytes Relative: 8 %
Neutro Abs: 4.3 10*3/uL (ref 1.7–7.7)
Neutrophils Relative %: 75 %
Platelets: 155 10*3/uL (ref 150–400)
RBC: 4.7 MIL/uL (ref 4.22–5.81)
RDW: 14.3 % (ref 11.5–15.5)
WBC: 5.8 10*3/uL (ref 4.0–10.5)
nRBC: 0.5 % — ABNORMAL HIGH (ref 0.0–0.2)

## 2020-05-06 LAB — I-STAT ARTERIAL BLOOD GAS, ED
Acid-base deficit: 4 mmol/L — ABNORMAL HIGH (ref 0.0–2.0)
Bicarbonate: 22.8 mmol/L (ref 20.0–28.0)
Calcium, Ion: 1.23 mmol/L (ref 1.15–1.40)
HCT: 36 % — ABNORMAL LOW (ref 39.0–52.0)
Hemoglobin: 12.2 g/dL — ABNORMAL LOW (ref 13.0–17.0)
O2 Saturation: 99 %
Patient temperature: 98.1
Potassium: 3.9 mmol/L (ref 3.5–5.1)
Sodium: 135 mmol/L (ref 135–145)
TCO2: 24 mmol/L (ref 22–32)
pCO2 arterial: 45.2 mmHg (ref 32.0–48.0)
pH, Arterial: 7.309 — ABNORMAL LOW (ref 7.350–7.450)
pO2, Arterial: 127 mmHg — ABNORMAL HIGH (ref 83.0–108.0)

## 2020-05-06 LAB — PHOSPHORUS: Phosphorus: 3.8 mg/dL (ref 2.5–4.6)

## 2020-05-06 LAB — URINALYSIS, ROUTINE W REFLEX MICROSCOPIC
Bilirubin Urine: NEGATIVE
Glucose, UA: >=500 mg/dL — AB
Hgb urine dipstick: NEGATIVE
Ketones, ur: NEGATIVE mg/dL
Leukocytes,Ua: NEGATIVE
Nitrite: NEGATIVE
Protein, ur: 100 mg/dL — AB
Specific Gravity, Urine: 1.032 — ABNORMAL HIGH (ref 1.005–1.030)
pH: 5 (ref 5.0–8.0)

## 2020-05-06 LAB — COMPREHENSIVE METABOLIC PANEL
ALT: 29 U/L (ref 0–44)
AST: 31 U/L (ref 15–41)
Albumin: 3.5 g/dL (ref 3.5–5.0)
Alkaline Phosphatase: 85 U/L (ref 38–126)
Anion gap: 16 — ABNORMAL HIGH (ref 5–15)
BUN: 24 mg/dL — ABNORMAL HIGH (ref 8–23)
CO2: 20 mmol/L — ABNORMAL LOW (ref 22–32)
Calcium: 9.1 mg/dL (ref 8.9–10.3)
Chloride: 100 mmol/L (ref 98–111)
Creatinine, Ser: 0.89 mg/dL (ref 0.61–1.24)
GFR, Estimated: >60 mL/min (ref >60)
Glucose, Bld: 377 mg/dL — ABNORMAL HIGH (ref 70–99)
Potassium: 4.4 mmol/L (ref 3.5–5.1)
Sodium: 136 mmol/L (ref 135–145)
Total Bilirubin: 1.4 mg/dL — ABNORMAL HIGH (ref 0.3–1.2)
Total Protein: 6.3 g/dL — ABNORMAL LOW (ref 6.5–8.1)

## 2020-05-06 LAB — PROCALCITONIN: Procalcitonin: 0.58 ng/mL

## 2020-05-06 LAB — MAGNESIUM: Magnesium: 1.6 mg/dL — ABNORMAL LOW (ref 1.7–2.4)

## 2020-05-06 LAB — CBG MONITORING, ED: Glucose-Capillary: 320 mg/dL — ABNORMAL HIGH (ref 70–99)

## 2020-05-06 LAB — RESP PANEL BY RT-PCR (FLU A&B, COVID) ARPGX2
Influenza A by PCR: NEGATIVE
Influenza B by PCR: NEGATIVE
SARS Coronavirus 2 by RT PCR: POSITIVE — AB

## 2020-05-06 LAB — LACTIC ACID, PLASMA: Lactic Acid, Venous: 2.3 mmol/L (ref 0.5–1.9)

## 2020-05-06 MED ORDER — LABETALOL HCL 5 MG/ML IV SOLN
10.0000 mg | Freq: Once | INTRAVENOUS | Status: AC
  Administered 2020-05-06: 10 mg via INTRAVENOUS
  Filled 2020-05-06: qty 4

## 2020-05-06 MED ORDER — PROPOFOL 1000 MG/100ML IV EMUL
5.0000 ug/kg/min | INTRAVENOUS | Status: DC
  Administered 2020-05-06: 10 ug/kg/min via INTRAVENOUS

## 2020-05-06 MED ORDER — DEXAMETHASONE SODIUM PHOSPHATE 4 MG/ML IJ SOLN
4.0000 mg | Freq: Once | INTRAMUSCULAR | Status: AC
  Administered 2020-05-06: 4 mg via INTRAVENOUS
  Filled 2020-05-06: qty 1

## 2020-05-06 MED ORDER — ETOMIDATE 2 MG/ML IV SOLN
INTRAVENOUS | Status: AC | PRN
Start: 2020-05-06 — End: 2020-05-06
  Administered 2020-05-06: 20 mg via INTRAVENOUS

## 2020-05-06 MED ORDER — ATORVASTATIN CALCIUM 80 MG PO TABS
80.0000 mg | ORAL_TABLET | Freq: Every day | ORAL | Status: DC
  Administered 2020-05-07: 80 mg
  Filled 2020-05-06: qty 1

## 2020-05-06 MED ORDER — LEVETIRACETAM IN NACL 1000 MG/100ML IV SOLN
1000.0000 mg | Freq: Once | INTRAVENOUS | Status: AC
  Administered 2020-05-06: 1000 mg via INTRAVENOUS
  Filled 2020-05-06: qty 100

## 2020-05-06 MED ORDER — FENTANYL CITRATE (PF) 100 MCG/2ML IJ SOLN
INTRAMUSCULAR | Status: AC
  Administered 2020-05-06: 50 ug via INTRAVENOUS
  Filled 2020-05-06: qty 2

## 2020-05-06 MED ORDER — SODIUM CHLORIDE 0.9 % IV SOLN
2000.0000 mg | Freq: Two times a day (BID) | INTRAVENOUS | Status: DC
  Administered 2020-05-07 – 2020-05-08 (×3): 2000 mg via INTRAVENOUS
  Filled 2020-05-06 (×5): qty 20

## 2020-05-06 MED ORDER — ROCURONIUM BROMIDE 10 MG/ML (PF) SYRINGE
PREFILLED_SYRINGE | INTRAVENOUS | Status: AC
  Filled 2020-05-06: qty 10

## 2020-05-06 MED ORDER — ROCURONIUM BROMIDE 50 MG/5ML IV SOLN
INTRAVENOUS | Status: AC | PRN
  Administered 2020-05-06: 100 mg via INTRAVENOUS

## 2020-05-06 MED ORDER — FENTANYL CITRATE (PF) 100 MCG/2ML IJ SOLN: 50.0000 ug | Freq: Once | INTRAMUSCULAR | Status: AC

## 2020-05-06 MED ORDER — DEXAMETHASONE 4 MG PO TABS
4.0000 mg | ORAL_TABLET | Freq: Every evening | ORAL | Status: DC
  Administered 2020-05-07: 4 mg
  Filled 2020-05-06: qty 1

## 2020-05-06 MED ORDER — INSULIN ASPART 100 UNIT/ML ~~LOC~~ SOLN
0.0000 [IU] | SUBCUTANEOUS | Status: DC
  Administered 2020-05-06: 15 [IU] via SUBCUTANEOUS
  Administered 2020-05-07 (×4): 4 [IU] via SUBCUTANEOUS
  Administered 2020-05-08 (×2): 3 [IU] via SUBCUTANEOUS

## 2020-05-06 MED ORDER — FENTANYL CITRATE (PF) 100 MCG/2ML IJ SOLN: 25.0000 ug | INTRAMUSCULAR | Status: DC | PRN

## 2020-05-06 MED ORDER — POLYETHYLENE GLYCOL 3350 17 G PO PACK
17.0000 g | PACK | Freq: Every day | ORAL | Status: DC | PRN
  Administered 2020-05-09: 17 g
  Filled 2020-05-06: qty 1

## 2020-05-06 MED ORDER — SUCCINYLCHOLINE CHLORIDE 200 MG/10ML IV SOSY
PREFILLED_SYRINGE | INTRAVENOUS | Status: AC
  Filled 2020-05-06: qty 10

## 2020-05-06 MED ORDER — PROPOFOL 1000 MG/100ML IV EMUL
5.0000 ug/kg/min | INTRAVENOUS | Status: DC
  Administered 2020-05-06: 40 ug/kg/min via INTRAVENOUS
  Administered 2020-05-06: 15 ug/kg/min via INTRAVENOUS
  Administered 2020-05-07: 20 ug/kg/min via INTRAVENOUS
  Administered 2020-05-07: 10 ug/kg/min via INTRAVENOUS
  Administered 2020-05-07: 15 ug/kg/min via INTRAVENOUS
  Filled 2020-05-06 (×7): qty 100

## 2020-05-06 MED ORDER — FENTANYL CITRATE (PF) 100 MCG/2ML IJ SOLN
25.0000 ug | INTRAMUSCULAR | Status: DC | PRN
  Administered 2020-05-06 – 2020-05-08 (×5): 100 ug via INTRAVENOUS
  Filled 2020-05-06 (×5): qty 2

## 2020-05-06 MED ORDER — FENTANYL CITRATE (PF) 100 MCG/2ML IJ SOLN
50.0000 ug | Freq: Once | INTRAMUSCULAR | Status: AC
  Administered 2020-05-06: 50 ug via INTRAVENOUS
  Filled 2020-05-06: qty 2

## 2020-05-06 MED ORDER — DEXAMETHASONE 4 MG PO TABS
2.0000 mg | ORAL_TABLET | Freq: Every day | ORAL | Status: DC
  Administered 2020-05-07: 2 mg
  Filled 2020-05-06: qty 1

## 2020-05-06 MED ORDER — LEVETIRACETAM IN NACL 1500 MG/100ML IV SOLN
1500.0000 mg | Freq: Two times a day (BID) | INTRAVENOUS | Status: DC
  Filled 2020-05-06: qty 100

## 2020-05-06 MED ORDER — PANTOPRAZOLE SODIUM 40 MG PO PACK
40.0000 mg | PACK | Freq: Every day | ORAL | Status: DC
  Administered 2020-05-07: 40 mg
  Filled 2020-05-06 (×2): qty 20

## 2020-05-06 MED ORDER — ETOMIDATE 2 MG/ML IV SOLN
INTRAVENOUS | Status: AC
  Filled 2020-05-06: qty 20

## 2020-05-06 MED ORDER — HEPARIN SODIUM (PORCINE) 5000 UNIT/ML IJ SOLN
5000.0000 [IU] | Freq: Three times a day (TID) | INTRAMUSCULAR | Status: DC
  Administered 2020-05-07 – 2020-05-13 (×19): 5000 [IU] via SUBCUTANEOUS
  Filled 2020-05-06 (×19): qty 1

## 2020-05-06 MED ORDER — DEXAMETHASONE 4 MG PO TABS: 2.0000 mg | ORAL_TABLET | Freq: Every day | ORAL | Status: DC

## 2020-05-06 MED ORDER — SODIUM CHLORIDE 0.9 % IV BOLUS
1000.0000 mL | Freq: Once | INTRAVENOUS | Status: AC
  Administered 2020-05-06: 1000 mL via INTRAVENOUS

## 2020-05-06 NOTE — Progress Notes (Signed)
RT assisted with transportation of this pt from ED room 7 to CT and back to room while on full ventilatory support. Pt tolerated well.

## 2020-05-06 NOTE — H&P (Addendum)
NAME:  [name], MRN:  [id number], DOB:  1945-08-13, LOS: 0 ADMISSION DATE:  05/06/2020, CONSULTATION DATE:  05/06/2020 REFERRING MD:  Dr. Almyra Free , CHIEF COMPLAINT:  Seizures, Unresponsive.    History of Present Illness:  75 year old male with glioblastoma s/p resection 06/2019. 1/17 Presents to ED with reported seizures, wife reports noting seizure activity patient then fell out of bed but did not hit head. After EMS arrived patient had 2 additional seizures. On arrival to ED remained unresponsive with GCS 3 requiring intubation. CT head with no acute, noted residual masslike density in the right occipital lobe with similar appearing mass effect with 4-5 mm midline shift. Given 1 gram of keppra and 4 mg decadron. Critical Care Consulted for admission.    Recent admission 03/2020 with recurrent seizures. Keppra increased to 1500 BID at this time. Reportedly over last year has declined physically as well with cognition and memory, needing increased amount of help with ADLS. Failed Chemo, last Nov 2021, Salvage Therapy planned to start. On 12/29 patient tested positive for COVID. On 12/31 received casirivimab\imdevimab infusion.   Past Medical History:  Recurrent Seizures, DM, Glioblastoma, HTN, Sinus Tachycardia   Significant Hospital Events:  12/31 > COVID + 1/17 > Presents to ED, Intubated.   Consults:  Neurology   Procedures:  ETT 1/17 >>  Significant Diagnostic Tests:  CXR 1/17 > Mild left basilar atelectasis or infiltrate is noted with small pleural effusion. Stable support apparatus. No pneumothorax is Noted. CT Head 1/17 > 1. Grossly similar appearance of the brain since head CT 03/16/2020. Residual masslike density in the right occipital lobe, likely involving splenium of corpus callosum. Similar surrounding edema and regional mass effect with about 4-5 mm midline shift to the left. No acute intracranial hemorrhage  Micro Data:  Blood 1/17 >> Urine 1/17 >>   Resp 1/17 >>  Antimicrobials:     Interim History / Subjective:  As above.   Objective   Blood pressure 103/75, pulse 94, temperature 98.3 F (36.8 C), resp. rate 18, height 5' 11" (1.803 m), weight 93 kg, SpO2 98 %.    Vent Mode: PRVC FiO2 (%):  [100 %] 100 % Set Rate:  [15 bmp] 15 bmp Vt Set:  [480 mL-600 mL] 600 mL PEEP:  [5 cmH20] 5 cmH20 Plateau Pressure:  [19 cmH20] 19 cmH20   Intake/Output Summary (Last 24 hours) at 05/06/2020 1925 Last data filed at 05/06/2020 1625 Gross per 24 hour  Intake 1000 ml  Output 200 ml  Net 800 ml   Filed Weights   05/06/20 1400  Weight: 93 kg    Examination: General: Elderly male on vent HENT: ETT/OG in place  Lungs: clear breath sound, vent assisted breaths  Cardiovascular: RRR, no MRG Abdomen: Soft, non-tender, active bowel sounds  Extremities: -edema  Neuro: sedated, pupils intact and reactive, no cough/gag  GU: foley in place   Resolved Hospital Problem list     Assessment & Plan:   Recurrent seizures in setting of glioblastoma, now post-ictal  -CT Head with no acute  Plan -Neurology Consulted -Given 1 g Keppra in ED with give additional 1 g now and increase maintance to 2 mg BID  -Continue Home Decadron schedule, 2 mg AM and 4 mg PM -EEG pending  -Continue Propofol and PRN Fentanyl for RASS goal -2/-3   Respiratory Insufficieny in setting of above.  COVID + on 12/29 (reportedly from [facility]) s/p casirivimab\imdevimab infusion 12/31  -CXR  with mild left basilar atelectasis/infiltrate  Plan  -Vent Support. -Trend ABG/CXR   H/O HTN H/O Sinus Tachycardia  Plan -Hold Metoprolol and Norvasc given borderline hypotension   Hyperglycemia  H/O DM -Previous AIC 8.2 Plan -Not on a home regimen  -Trend Glucose with SSI q4h   Glioblastoma with isocitrate dehydrogenase gene wildtype - followed by [name], failed chemotherapy, s/p resection 06/2019, recently offered salvage therapy vs hospice. Family wanting to continue  treatments at this time.  Plan  -Continue scheduled decadron, keppra as above.   Best practice (evaluated daily)  Diet: NPO. Start TF  Pain/Anxiety/Delirium protocol (if indicated) VAP protocol (if indicated) DVT prophylaxis: Heparin sq  GI prophylaxis: PPI Glucose control: SSI  Mobility: Bedrest  Disposition:Admit to ICU   Goals of Care:  Last date of multidisciplinary goals of care discussion: Family and staff present: Wife  Summary of discussion: Full Code  Follow up goals of care discussion due: 1/24 Code Status: Full Code   Labs   CBC: Recent Labs  Lab 05/06/20 1353 05/06/20 1621  WBC 5.8  --   NEUTROABS 4.3  --   HGB 13.9 12.2*  HCT 43.3 36.0*  MCV 92.1  --   PLT 155  --     Basic Metabolic Panel: Recent Labs  Lab 05/06/20 1353 05/06/20 1621  NA 136 135  K 4.4 3.9  CL 100  --   CO2 20*  --   GLUCOSE 377*  --   BUN 24*  --   CREATININE 0.89  --   CALCIUM 9.1  --    GFR: Estimated Creatinine Clearance: 84.9 mL/min (by C-G formula based on SCr of 0.89 mg/dL). Recent Labs  Lab 05/06/20 1353  WBC 5.8    Liver Function Tests: Recent Labs  Lab 05/06/20 1353  AST 31  ALT 29  ALKPHOS 85  BILITOT 1.4*  PROT 6.3*  ALBUMIN 3.5   No results for input(s): LIPASE, AMYLASE in the last 168 hours. No results for input(s): AMMONIA in the last 168 hours.  ABG    Component Value Date/Time   PHART 7.309 (L) 05/06/2020 1621   PCO2ART 45.2 05/06/2020 1621   PO2ART 127 (H) 05/06/2020 1621   HCO3 22.8 05/06/2020 1621   TCO2 24 05/06/2020 1621   ACIDBASEDEF 4.0 (H) 05/06/2020 1621   O2SAT 99.0 05/06/2020 1621     Coagulation Profile: No results for input(s): INR, PROTIME in the last 168 hours.  Cardiac Enzymes: No results for input(s): CKTOTAL, CKMB, CKMBINDEX, TROPONINI in the last 168 hours.  HbA1C: Hgb A1c MFr Bld  Date/Time Value Ref Range Status  03/18/2020 10:16 AM 8.2 (H) 4.8 - 5.6 % Final    Comment:    (NOTE) Pre diabetes:           5.7%-6.4%  Diabetes:              >6.4%  Glycemic control for   <7.0% adults with diabetes   10/08/2019 07:19 PM 6.9 (H) 4.8 - 5.6 % Final    Comment:    (NOTE)         Prediabetes: 5.7 - 6.4         Diabetes: >6.4         Glycemic control for adults with diabetes: <7.0     CBG: No results for input(s): GLUCAP in the last 168 hours.  Review of Systems:   Unable to review given encephalopathy   Past Medical History:  He,  has a past  medical history of Anxiety, Arthritis, ASCVD (arteriosclerotic cardiovascular disease), CAD (coronary artery disease), Carcinoma of prostate ([place]), Constipation due to pain medication, Diabetes mellitus without complication ([place]), Diabetic neuropathy ([place]), Headache (2021), Hyperlipidemia, Hypertension, Mild depression ([place]), Morbid obesity ([place]), Myocardial infarction [facility]), Shortness of breath dyspnea, Sleep apnea, Stroke ([place]) (2017), and Tobacco abuse.   Surgical History:   Past Surgical History:  Procedure Laterality Date  . APPLICATION OF CRANIAL NAVIGATION N/A 07/04/2019   Procedure: APPLICATION OF CRANIAL NAVIGATION;  Surgeon: Ashok Pall, MD;  Location: [facility];  Service: Neurosurgery;  Laterality: N/A;  . BACK SURGERY    . CARDIAC CATHETERIZATION     X 1 stent before having CABG  . CORONARY ARTERY BYPASS GRAFT  06/28/2008   X4  . CRANIOTOMY Right 07/04/2019   Procedure: Right occipital craniotomy for tumor with brainlab;  Surgeon: Ashok Pall, MD;  Location: [facility];  Service: Neurosurgery;  Laterality: Right;  . ENDARTERECTOMY Right 07/03/2015   Procedure: RIGHT CAROTID ENDARTERECTOMY WITH BOVINE PERICARDIUM PATCH ANGIOPLASTY;  Surgeon: Serafina Mitchell, MD;  Location[place];  Service: Vascular;  Laterality: Right;  . LUMBAR LAMINECTOMY/DECOMPRESSION MICRODISCECTOMY N/A 09/06/2014   Procedure: LUMBER DECOMPRESSION L3-5;  Surgeon: Melina Schools, MD;  Location: [facility];  Service: Orthopedics;  Laterality: N/A;  . LUMBAR SPINE SURGERY  2002  .  PROSTATE BIOPSY    . PROSTATECTOMY  02/2007  . VASECTOMY       Social History:   reports that he quit smoking about 11 years ago. His smoking use included cigarettes. He has a 40.00 pack-year smoking history. He quit smokeless tobacco use about 10 years ago. He reports current alcohol use of about 3.0 standard drinks of alcohol per week. He reports that he does not use drugs.   Family History:  His family history includes Breast cancer in his sister; Kidney disease in his mother; Stroke in his father. There is no history of Colon cancer, Prostate cancer, or Pancreatic cancer.   Allergies Allergies  Allergen Reactions  . Other Other (See Comments)    THE PATIENT'S SKIN HAS BECOME VERY THIN- BRUISES AND TEARS VERY EASILY!!  . Tape Other (See Comments)    THE PATIENT'S SKIN HAS BECOME VERY THIN- BRUISES AND TEARS VERY EASILY!!  . Lisinopril Cough     Home Medications  Prior to Admission medications   Medication Sig Start Date End Date Taking? Authorizing Provider  amLODipine (NORVASC) 10 MG tablet TAKE 1 TABLET(10 MG) BY MOUTH EVERY EVENING Patient taking differently: Take 10 mg by mouth every evening. 04/05/20  Yes Tolia, Sunit, DO  atorvastatin (LIPITOR) 80 MG tablet TAKE 1 TABLET(80 MG) BY MOUTH AT BEDTIME Patient taking differently: Take 80 mg by mouth at bedtime. 04/05/20  Yes Tolia, Sunit, DO  calcium citrate (CALCITRATE - DOSED IN MG ELEMENTAL CALCIUM) 950 (200 Ca) MG tablet Take 1 tablet (200 mg of elemental calcium total) by mouth daily. Patient taking differently: Take 200 mg of elemental calcium by mouth daily after breakfast. 10/18/19  Yes Angiulli, Lavon Paganini, PA-C  dexamethasone (DECADRON) 2 MG tablet Take 1-2 tablets (2-4 mg total) by mouth See admin instructions. Take 1 tablet (63m) by mouth in the morning and 2 tablets (432m by mouth in the evening. Patient taking differently: Take 2-4 mg by mouth See admin instructions. Take 2 mg by mouth in the morning and 4 mg in the  evening 03/21/20  Yes GiDwyane DeeMD  levETIRAcetam (KEPPRA) 750 MG tablet Take 2 tablets (1,500 mg total)  by mouth 2 (two) times daily. Patient taking differently: Take 1,500 mg by mouth in the morning and at bedtime. 03/21/20  Yes Dwyane Dee, MD  LORazepam (ATIVAN) 0.5 MG tablet Take 0.5 mg by mouth See admin instructions. Take 0.5 mg by mouth as directed prior to MRI(s)   Yes [provider]  metoprolol succinate (TOPROL-XL) 25 MG 24 hr tablet TAKE 1/2 TABLET(12.5 MG) BY MOUTH EVERY EVENING Patient taking differently: Take 12.5 mg by mouth every evening. 04/08/20  Yes Rex Kras, DO     Critical care time: [place], AGACNP-BC [facility]  PCCM Pgr: [phone]   Patient seen and examined. Met the patient's wife at bedside and obtained most of the history from her. Patient was in usual state of health until this morning when the patient had new onset seizure.  He has a known history of GBM which he was diagnosed with in March 2021.  He is undergoing chemotherapy until November 2021.  He was to convert onto oral chemotherapy but was delayed because of a diagnosis of COVID on 12-29- 2022.  He received monoclonal antibody on 04/19/2020.    Assessment Recurrent seizures Glioblastoma multiforma Acute respiratory failure  Plan: Admit to the intensive care unit. Standard ventilator protocol was initiated. EEG is pending Consult neurology. Continue propofol infusion Keppra was initiated. Monitor I's/O  Discussed plan of care with patient's wife.  At this time she would like the patient be a full code.  She understands the current diagnosis as well as overall prognosis related to the GBM per oncology service.

## 2020-05-06 NOTE — Code Documentation (Signed)
Pt is now intubated & on the ventilator.

## 2020-05-06 NOTE — Consult Note (Signed)
Neurology Consult H&P  CC: GBM and epilepsy with breakthrough seizue  History is obtained from: chart as patient is intubated.  HPI: [name] is a 75 y.o. male with known glioma s/p resection, RTx and chemo, epilepsy on LEV with breakthrough seizure.  The following was taken from the chart:  "They [Family] noticed just prior to arrival that the patient was having generalized shaking, seizure like activity, duration unknown.  EMS arrived and gave 4mg  of VERSED, and seizure subsequently resolved.  He was very apneic and had desaturations on non-rebreather, requiring patient to be bagged using BVM.  No reports of fevers, vomiting or diarrhea.  Recent COVID infection 1 week ago, s/p MAB infusion per wife."   ROS: Unable to assess due to paralytic, intubated and sedated.  Past Medical History:  Diagnosis Date  . Anxiety   . Arthritis   . ASCVD (arteriosclerotic cardiovascular disease)    03/2004: DES x2 to the RCA; IMI with stent occlusion in 02/2005- suboptimal Plavix compliance  . CAD (coronary artery disease)   . Carcinoma of prostate ([place])    Clopidogrel held for biopsy  . Constipation due to pain medication   . Diabetes mellitus without complication ([place])   . Diabetic neuropathy ([place])   . Headache 2021  . Hyperlipidemia   . Hypertension   . Mild depression ([place])   . Morbid obesity ([place])   . Myocardial infarction ([place])   . Shortness of breath dyspnea   . Sleep apnea    no longer uses cpap  . Stroke ([place]) 2017  . Tobacco abuse    stopped smoking 06/28/09     Family History  Problem Relation Age of Onset  . Kidney disease Mother   . Stroke Father   . Breast cancer Sister        breast progressed into bone  . Colon cancer Neg Hx   . Prostate cancer Neg Hx   . Pancreatic cancer Neg Hx     Social History:  reports that he quit smoking about 11 years ago. His smoking use included cigarettes. He has a 40.00 pack-year smoking history. He quit smokeless tobacco use  about 10 years ago. He reports current alcohol use of about 3.0 standard drinks of alcohol per week. He reports that he does not use drugs.   Prior to Admission medications   Medication Sig Start Date End Date Taking? Authorizing Provider  amLODipine (NORVASC) 10 MG tablet TAKE 1 TABLET(10 MG) BY MOUTH EVERY EVENING Patient taking differently: Take 10 mg by mouth every evening. 04/05/20  Yes Tolia, Sunit, DO  atorvastatin (LIPITOR) 80 MG tablet TAKE 1 TABLET(80 MG) BY MOUTH AT BEDTIME Patient taking differently: Take 80 mg by mouth at bedtime. 04/05/20  Yes Tolia, Sunit, DO  calcium citrate (CALCITRATE - DOSED IN MG ELEMENTAL CALCIUM) 950 (200 Ca) MG tablet Take 1 tablet (200 mg of elemental calcium total) by mouth daily. Patient taking differently: Take 200 mg of elemental calcium by mouth daily after breakfast. 10/18/19  Yes Angiulli, Lavon Paganini, PA-C  dexamethasone (DECADRON) 2 MG tablet Take 1-2 tablets (2-4 mg total) by mouth See admin instructions. Take 1 tablet (2mg ) by mouth in the morning and 2 tablets (4mg ) by mouth in the evening. Patient taking differently: Take 2-4 mg by mouth See admin instructions. Take 2 mg by mouth in the morning and 4 mg in the evening 03/21/20  Yes Dwyane Dee, MD  levETIRAcetam (KEPPRA) 750 MG tablet Take 2 tablets (1,500 mg total)  by mouth 2 (two) times daily. Patient taking differently: Take 1,500 mg by mouth in the morning and at bedtime. 03/21/20  Yes Dwyane Dee, MD  LORazepam (ATIVAN) 0.5 MG tablet Take 0.5 mg by mouth See admin instructions. Take 0.5 mg by mouth as directed prior to MRI(s)   Yes [provider]  metoprolol succinate (TOPROL-XL) 25 MG 24 hr tablet TAKE 1/2 TABLET(12.5 MG) BY MOUTH EVERY EVENING Patient taking differently: Take 12.5 mg by mouth every evening. 04/08/20  Yes Rex Kras, DO    Exam: Current vital signs: BP 93/73   Pulse 97   Temp 98.4 F (36.9 C)   Resp (!) 23   Ht 5\' 11"  (1.803 m)   Wt 93 kg   SpO2 98%    BMI 28.60 kg/m   Physical Exam  Constitutional: Appears well-developed and well-nourished.  Psych: Unable to assess due to paralytic, intubated and sedated. Eyes: No scleral injection HENT: No OP obstrucion Head: Normocephalic.  Cardiovascular: Normal rate and regular rhythm.  Respiratory: Chest excursions equal and bilaterally symmetric, no audible wheezes. GI: Soft.  No distension. There is no tenderness.  Skin: WDI  Neuro: Mental Status: Unable to assess due to paralytic, intubated and sedated - exam is limited. Cranial Nerves: II: Visual Fields are full. Pupils are equal ~1-71mm, round, and sluggishly reactive to light. Doll's (+)    Motor: Tone is normal. Bulk is normal. No movement due to paralytic at time of exam Sensory:  Deep Tendon Reflexes: Decreased throughout. Plantars: Mute bilaterally. Cerebellar: unable to test  I have reviewed labs in epic and the pertinent results are:   Ref. Range 05/07/2020 03:20  RBC Latest Ref Range: 4.22 - 5.81 MIL/uL 4.14 (L)  Hemoglobin Latest Ref Range: 13.0 - 17.0 g/dL 12.8 (L)  HCT Latest Ref Range: 39.0 - 52.0 % 36.3 (L)  MCV Latest Ref Range: 80.0 - 100.0 fL 87.7  MCH Latest Ref Range: 26.0 - 34.0 pg 30.9  MCHC Latest Ref Range: 30.0 - 36.0 g/dL 35.3  RDW Latest Ref Range: 11.5 - 15.5 % 14.6  Platelets Latest Ref Range: 150 - 400 K/uL 117 (L)  nRBC Latest Ref Range: 0.0 - 0.2 % 0.6 (H)  Glucose Latest Ref Range: 70 - 99 mg/dL 217 (H)  SARS-CoV-2 Ab, IgG Latest Ref Range: NON REACTIVE  Reactive (A)    I have reviewed the images obtained: NCT head showed masslike density in the right occipital lobe, likely involving splenium of corpus callosum with surrounding edema and regional mass effect with about 4-5 mm midline shift to the left. No acute intracranial hemorrhage.  Assessment: TRACE WIRICK is a 75 y.o. male with known glioma s/p resection, RTx and chemo, epilepsy on LEV with breakthrough seizure. The two most  effective antiseizure medications in primary brain tumors are LEV and VPA.   Impression:  Tumor related epilepsy Breakthrough seizure Glioma s/p resection, RTx and chemo Endotracheal intubation.  Plan: Increase levetiracetam to 2,000mg  two times daily. If the patient has further breakthrough seizures recommend loading with valproic acid EEG - ordered. Other comorbidities per primary team.   This patient is critically ill and at significant risk of neurological worsening, death and care requires constant monitoring of vital signs, hemodynamics,respiratory and cardiac monitoring, neurological assessment, discussion with family, other specialists and medical decision making of high complexity. I spent 70 minutes of neurocritical care time  in the care of  this patient. This was time spent independent of any time provided by nurse  practitioner or PA.  Electronically signed by:  Lynnae Sandhoff, MD Page: [phone] 05/06/2020, 7:45 PM

## 2020-05-06 NOTE — ED Notes (Signed)
This RN & RT is with pt in CT at this time.

## 2020-05-06 NOTE — ED Notes (Signed)
Patient resting in bed with no issues noted at this time  Wife at bedside

## 2020-05-06 NOTE — ED Triage Notes (Signed)
Pt BIB [facility] d/t pt having a seizure, history of brain CA, takes Keppra & has been compliant. EMS reports 3 seizerues while with them & assisting respirations to keep his sats in the 90's. NRB mask had pt's O2 around 85%, CBG 449. GCS of 3 upon arrival.

## 2020-05-06 NOTE — Code Documentation (Signed)
MD Fisher bagging pt while RT sets up ventilator, PA at bedside to assist. Sharmon Revere RN preparing to push RSI kit meds for intubation.

## 2020-05-06 NOTE — ED Notes (Signed)
Report called for patient  Ready to roll when room ready

## 2020-05-06 NOTE — ED Provider Notes (Signed)
[facility] EMERGENCY DEPARTMENT Provider Note   CSN: [id number] Arrival date & time: 05/06/20  1334     History Chief Complaint  Patient presents with  . Seizures  . Assisted Respirations     [name] is a 75 y.o. male.  Patient presents with concern for recurrent seizure.  He has known history of brain mass, s/p resection and oncologic treatments.  He has been taking his Keppra regulary per his family.  They noticed just prior to arrival that the patient was having generalized shaking, seizure like activity, duration unknown.  EMS arrived and gave 4mg  of VERSED, and seizure subsequently resolved.  He was very apneic and had desaturations on non-rebreather, requiring patient to be bagged using BVM.  No reports of fevers, vomiting or diarrhea.  Recent COVID infection 1 week ago, s/p MAB infusion per wife.        Past Medical History:  Diagnosis Date  . Anxiety   . Arthritis   . ASCVD (arteriosclerotic cardiovascular disease)    03/2004: DES x2 to the RCA; IMI with stent occlusion in 02/2005- suboptimal Plavix compliance  . CAD (coronary artery disease)   . Carcinoma of prostate ([place])    Clopidogrel held for biopsy  . Constipation due to pain medication   . Diabetes mellitus without complication ([place])   . Diabetic neuropathy ([place])   . Headache 2021  . Hyperlipidemia   . Hypertension   . Mild depression ([place])   . Morbid obesity ([place])   . Myocardial infarction ([place])   . Shortness of breath dyspnea   . Sleep apnea    no longer uses cpap  . Stroke ([place]) 2017  . Tobacco abuse    stopped smoking 06/28/09    Patient Active Problem List   Diagnosis Date Noted  . COVID-19 05/06/2020  . Goals of care, counseling/discussion 03/22/2020  . Seizure ([place]) 03/16/2020  . 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 Dyslipidemia   . Seizure prophylaxis   . Chronic diastolic congestive heart failure ([place])   . Diabetic peripheral neuropathy ([place])   .  Palliative care by specialist   . Weakness 10/08/2019  . Vasogenic edema ([place]) 10/08/2019  . Gross hematuria 10/08/2019  . DNR (do not resuscitate) discussion 07/13/2019  . S/P craniotomy 07/04/2019  . 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 Respiratory failure ([place])   . Encephalopathy acute   . Brain mass 07/01/2019  . Fever 07/01/2019  . Atherosclerosis of native artery of both lower extremities with intermittent claudication ([place]) 08/23/2018  . Coronary artery disease without angina pectoris 08/23/2018  . Dyspnea on exertion 08/23/2018  . 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 S/P carotid endarterectomy 08/20/2015  . Essential hypertension 08/20/2015  . 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 HLD (hyperlipidemia) 08/20/2015  . Carotid artery stenosis, symptomatic 07/03/2015  . Carotid stenosis, right   . TIA (transient ischemic attack) 06/18/2015  . ASCVD (arteriosclerotic cardiovascular disease) 06/18/2015  . Diabetes mellitus without complication ([place]) 99/83/3825  . Resistant hypertension 06/18/2015  . Hyperlipidemia 06/18/2015  . Spinal stenosis of lumbar region with neurogenic claudication 09/06/2014  . Atherosclerosis of native artery of extremity with intermittent claudication ([place]) 03/09/2013  . HYPERLIPIDEMIA 04/08/2009  . TOBACCO ABUSE 04/08/2009  . ATHEROSCLEROTIC CARDIOVASCULAR DISEASE 04/08/2009  . INTERMITTENT VERTIGO 04/08/2009  . ADENOCARCINOMA, PROSTATE 03/21/2009  . ABDOMINAL PAIN -GENERALIZED 03/21/2009  . History of coronary artery bypass graft 06/28/2008  Past Surgical History:  Procedure Laterality Date  . APPLICATION OF CRANIAL NAVIGATION N/A 07/04/2019   Procedure: APPLICATION OF CRANIAL NAVIGATION;  Surgeon: Ashok Pall, MD;  Location: [facility];  Service: Neurosurgery;  Laterality: N/A;  . BACK SURGERY    . CARDIAC CATHETERIZATION     X 1 stent  before having CABG  . CORONARY ARTERY BYPASS GRAFT  06/28/2008   X4  . CRANIOTOMY Right 07/04/2019   Procedure: Right occipital craniotomy for tumor with brainlab;  Surgeon: Ashok Pall, MD;  Location: [facility];  Service: Neurosurgery;  Laterality: Right;  . ENDARTERECTOMY Right 07/03/2015   Procedure: RIGHT CAROTID ENDARTERECTOMY WITH BOVINE PERICARDIUM PATCH ANGIOPLASTY;  Surgeon: Serafina Mitchell, MD;  Location: [facility];  Service: Vascular;  Laterality: Right;  . LUMBAR LAMINECTOMY/DECOMPRESSION MICRODISCECTOMY N/A 09/06/2014   Procedure: LUMBER DECOMPRESSION L3-5;  Surgeon: Melina Schools, MD;  Location: [facility];  Service: Orthopedics;  Laterality: N/A;  . LUMBAR SPINE SURGERY  2002  . PROSTATE BIOPSY    . PROSTATECTOMY  02/2007  . VASECTOMY         Family History  Problem Relation Age of Onset  . Kidney disease Mother   . Stroke Father   . Breast cancer Sister        breast progressed into bone  . Colon cancer Neg Hx   . Prostate cancer Neg Hx   . Pancreatic cancer Neg Hx     Social History   Tobacco Use  . Smoking status: Former Smoker    Packs/day: 1.00    Years: 40.00    Pack years: 40.00    Types: Cigarettes    Quit date: 06/28/2008    Years since quitting: 11.8  . Smokeless tobacco: Former Systems developer    Quit date: 06/28/2009  Vaping Use  . Vaping Use: Never used  Substance Use Topics  . Alcohol use: Yes    Alcohol/week: 3.0 standard drinks    Types: 2 Glasses of wine, 1 Cans of beer per week    Comment: may have 1 drink a week  . Drug use: No    Home Medications Prior to Admission medications   Medication Sig Start Date End Date Taking? Authorizing Provider  amLODipine (NORVASC) 10 MG tablet TAKE 1 TABLET(10 MG) BY MOUTH EVERY EVENING Patient taking differently: Take 10 mg by mouth every evening. 04/05/20  Yes Tolia, Sunit, DO  atorvastatin (LIPITOR) 80 MG tablet TAKE 1 TABLET(80 MG) BY MOUTH AT BEDTIME Patient taking differently: Take 80 mg by mouth at bedtime.  04/05/20  Yes Tolia, Sunit, DO  calcium citrate (CALCITRATE - DOSED IN MG ELEMENTAL CALCIUM) 950 (200 Ca) MG tablet Take 1 tablet (200 mg of elemental calcium total) by mouth daily. Patient taking differently: Take 200 mg of elemental calcium by mouth daily after breakfast. 10/18/19  Yes Angiulli, Lavon Paganini, PA-C  dexamethasone (DECADRON) 2 MG tablet Take 1-2 tablets (2-4 mg total) by mouth See admin instructions. Take 1 tablet (2mg ) by mouth in the morning and 2 tablets (4mg ) by mouth in the evening. Patient taking differently: Take 2-4 mg by mouth See admin instructions. Take 2 mg by mouth in the morning and 4 mg in the evening 03/21/20  Yes Dwyane Dee, MD  levETIRAcetam (KEPPRA) 750 MG tablet Take 2 tablets (1,500 mg total) by mouth 2 (two) times daily. Patient taking differently: Take 1,500 mg by mouth in the morning and at bedtime. 03/21/20  Yes Dwyane Dee, MD  LORazepam (ATIVAN) 0.5 MG tablet Take 0.5  mg by mouth See admin instructions. Take 0.5 mg by mouth as directed prior to MRI(s)   Yes [provider]  metoprolol succinate (TOPROL-XL) 25 MG 24 hr tablet TAKE 1/2 TABLET(12.5 MG) BY MOUTH EVERY EVENING Patient taking differently: Take 12.5 mg by mouth every evening. 04/08/20  Yes Tolia, Sunit, DO    Allergies    Other, Tape, and Lisinopril  Review of Systems   Review of Systems  Unable to perform ROS: Acuity of condition    Physical Exam Updated Vital Signs BP 93/73   Pulse 97   Temp 98.4 F (36.9 C)   Resp (!) 23   Ht 5\' 11"  (1.803 m)   Wt 93 kg   SpO2 98%   BMI 28.60 kg/m   Physical Exam Constitutional:      Comments: Unresponsive to painful stimuli  HENT:     Head: Normocephalic.     Right Ear: External ear normal.     Left Ear: External ear normal.     Nose: Nose normal.     Mouth/Throat:     Mouth: Mucous membranes are moist.  Pulmonary:     Comments: Shallow spontaneous breaths.  Clear breath sounds  Abdominal:     General: Abdomen is flat.  There is no distension.  Musculoskeletal:     Cervical back: Neck supple.  Neurological:     Comments: No withdrwwal to pain.  Non-verbal, not following commands.     ED Results / Procedures / Treatments   Labs (all labs ordered are listed, but only abnormal results are displayed) Labs Reviewed  RESP PANEL BY RT-PCR (FLU A&B, COVID) ARPGX2 - Abnormal; Notable for the following components:      Result Value   SARS Coronavirus 2 by RT PCR POSITIVE (*)    All other components within normal limits  COMPREHENSIVE METABOLIC PANEL - Abnormal; Notable for the following components:   CO2 20 (*)    Glucose, Bld 377 (*)    BUN 24 (*)    Total Protein 6.3 (*)    Total Bilirubin 1.4 (*)    Anion gap 16 (*)    All other components within normal limits  CBC WITH DIFFERENTIAL/PLATELET - Abnormal; Notable for the following components:   nRBC 0.5 (*)    Abs Immature Granulocytes 0.28 (*)    All other components within normal limits  URINALYSIS, ROUTINE W REFLEX MICROSCOPIC - Abnormal; Notable for the following components:   APPearance HAZY (*)    Specific Gravity, Urine 1.032 (*)    Glucose, UA >=500 (*)    Protein, ur 100 (*)    Bacteria, UA RARE (*)    All other components within normal limits  I-STAT ARTERIAL BLOOD GAS, ED - Abnormal; Notable for the following components:   pH, Arterial 7.309 (*)    pO2, Arterial 127 (*)    Acid-base deficit 4.0 (*)    HCT 36.0 (*)    Hemoglobin 12.2 (*)    All other components within normal limits  CULTURE, BLOOD (ROUTINE X 2)  CULTURE, BLOOD (ROUTINE X 2)  CULTURE, RESPIRATORY  BLOOD GAS, ARTERIAL  CBC  MAGNESIUM  PHOSPHORUS  COMPREHENSIVE METABOLIC PANEL  MAGNESIUM  PHOSPHORUS  LACTIC ACID, PLASMA  LACTIC ACID, PLASMA  PROCALCITONIN  PROCALCITONIN  STREP PNEUMONIAE URINARY ANTIGEN  SAR COV2 SEROLOGY (COVID19)AB(IGG),IA    EKG EKG Interpretation  Date/Time:  Monday May 06 2020 15:25:18 EST Ventricular Rate:  184 PR Interval:     QRS Duration: 146  QT Interval:  297 QTC Calculation: 520 R Axis:   -131 Text Interpretation: Extreme tachycardia with wide complex, no further rhythm analysis attempted Since last tracing of earlier today unchanged Confirmed by Daleen Bo [phone]) on 05/06/2020 4:39:59 PM   Radiology DG Chest 1 View  Result Date: 05/06/2020 CLINICAL DATA:  Respiratory failure. EXAM: CHEST  1 VIEW COMPARISON:  March 16, 2020. FINDINGS: Stable cardiomediastinal silhouette. Endotracheal and nasogastric tubes appear to be in good position. No pneumothorax is noted. Right lung is clear. Status post coronary bypass graft. Mild left basilar atelectasis or infiltrate is noted with small pleural effusion. Bony thorax is unremarkable. IMPRESSION: Mild left basilar atelectasis or infiltrate is noted with small pleural effusion. Stable support apparatus. No pneumothorax is noted. Electronically Signed   By: Marijo Conception M.D.   On: 05/06/2020 14:51   CT Head Wo Contrast  Result Date: 05/06/2020 CLINICAL DATA:  Mental status change EXAM: CT HEAD WITHOUT CONTRAST TECHNIQUE: Contiguous axial images were obtained from the base of the skull through the vertex without intravenous contrast. COMPARISON:  CT 03/16/2020, MRI 03/13/2020, CT 10/08/2019, 07/01/2019 FINDINGS: Brain: No acute intracranial hemorrhage. Residual masslike density within the right occipital lobe with soft tissue density in the region of splenium of corpus callosum. Progressive gyriform calcification at the right occipital lobe. Extensive hypodensity within the right temporal lobe, occipital lobe, and parietal lobe grossly similar as compared to prior. Mass effect on the right lateral ventricle without significant change. About 4-5 mm midline shift to the left, also grossly similar. Vascular: No hyperdense vessels.  Carotid vascular calcification. Skull: Previous right posterior craniotomy.  No fracture Sinuses/Orbits: Mucosal thickening in the ethmoid and  maxillary sinuses. Other: None IMPRESSION: 1. Grossly similar appearance of the brain since head CT 03/16/2020. Residual masslike density in the right occipital lobe, likely involving splenium of corpus callosum. Similar surrounding edema and regional mass effect with about 4-5 mm midline shift to the left. No acute intracranial hemorrhage. Electronically Signed   By: Donavan Foil M.D.   On: 05/06/2020 17:42    Procedures Procedure Name: Intubation Date/Time: 05/06/2020 8:27 PM Performed by: Luna Fuse, MD Pre-anesthesia Checklist: Patient identified, Patient being monitored, Emergency Drugs available, Timeout performed and Suction available Oxygen Delivery Method: Ambu bag Preoxygenation: Pre-oxygenation with 100% oxygen Induction Type: Rapid sequence Ventilation: Mask ventilation without difficulty and Nasal airway inserted- appropriate to patient size Laryngoscope Size: 4 Nasal Tubes: Right Number of attempts: 1 Airway Equipment and Method: Video-laryngoscopy Placement Confirmation: ETT inserted through vocal cords under direct vision,  CO2 detector,  Breath sounds checked- equal and bilateral and Positive ETCO2 Tube secured with: ETT holder    .Critical Care Performed by: Luna Fuse, MD Authorized by: Luna Fuse, MD   Critical care provider statement:    Critical care time (minutes):  45   Critical care was necessary to treat or prevent imminent or life-threatening deterioration of the following conditions:  CNS failure or compromise and respiratory failure   Critical care was time spent personally by me on the following activities:  Discussions with consultants, evaluation of patient's response to treatment, examination of patient, ordering and performing treatments and interventions, ordering and review of laboratory studies, ordering and review of radiographic studies, pulse oximetry, re-evaluation of patient's condition, obtaining history from patient or surrogate and  review of old charts   (including critical care time)  Medications Ordered in ED Medications  rocuronium bromide 100 MG/10ML SOSY (0 mg  Hold 05/06/20 1418)  succinylcholine (ANECTINE) 200 MG/10ML syringe (0 mg  Hold 05/06/20 1418)  propofol (DIPRIVAN) 1000 MG/100ML infusion (40 mcg/kg/min  93 kg Intravenous New Bag/Given 05/06/20 1940)  polyethylene glycol (MIRALAX / GLYCOLAX) packet 17 g (has no administration in time range)  insulin aspart (novoLOG) injection 0-20 Units (has no administration in time range)  fentaNYL (SUBLIMAZE) injection 25 mcg (has no administration in time range)  fentaNYL (SUBLIMAZE) injection 25-100 mcg (has no administration in time range)  levETIRAcetam (KEPPRA) IVPB 1000 mg/100 mL premix (1,000 mg Intravenous New Bag/Given 05/06/20 2020)  levETIRAcetam (KEPPRA) 2,000 mg in sodium chloride 0.9 % 250 mL IVPB (has no administration in time range)  atorvastatin (LIPITOR) tablet 80 mg (has no administration in time range)  dexamethasone (DECADRON) tablet 4 mg (has no administration in time range)  dexamethasone (DECADRON) tablet 2 mg (has no administration in time range)  pantoprazole sodium (PROTONIX) 40 mg/20 mL oral suspension 40 mg (has no administration in time range)  heparin injection 5,000 Units (has no administration in time range)  etomidate (AMIDATE) injection (0 mg Intravenous Hold 05/06/20 1418)  rocuronium (ZEMURON) injection (100 mg Intravenous Given 05/06/20 1343)  sodium chloride 0.9 % bolus 1,000 mL (0 mLs Intravenous Stopped 05/06/20 1521)  dexamethasone (DECADRON) injection 4 mg (4 mg Intravenous Given 05/06/20 1416)  fentaNYL (SUBLIMAZE) injection 50 mcg (50 mcg Intravenous Given 05/06/20 1416)  fentaNYL (SUBLIMAZE) injection 50 mcg (50 mcg Intravenous Given 05/06/20 1543)  labetalol (NORMODYNE) injection 10 mg (10 mg Intravenous Given 05/06/20 1543)  levETIRAcetam (KEPPRA) IVPB 1000 mg/100 mL premix (0 mg Intravenous Stopped 05/06/20 1649)    ED Course   I have reviewed the triage vital signs and the nursing notes.  Pertinent labs & imaging results that were available during my care of the patient were reviewed by me and considered in my medical decision making (see chart for details).    MDM Rules/Calculators/A&P                          Patient arrived severely apneic, being bagged by EMS.  No withdrawal to painful stimuli.    Decision made to intubate for airway protection.  Concern for worsening brain edema, vs ICH.  Intubated with glidescope and 7.5 ETT, good breath sounds bilaterally.    Patient then became severely tachycardic, wide complex rhythm.  Appears irregular.  Baseline EKG appears wide complex.  Suspect new onset Afib? Versus tachycardia due to recent intubation, paralysis?    Initially appeared to improve with propofol and fentanyl given.  Heart rate improved gradually to 120 BPM, then 90 BPM.  I was called back to patient's bedside as heart rate appears to have jumped up again.  Given additional fentanyl, but suspect new onset Afib more.  Given 10mg  labetlol with resolution of tachyarrythmia.  Given 1g Keppra IV, pending CT head.  Anticipate ICU admission.  Final Clinical Impression(s) / ED Diagnoses Final diagnoses:  Acute encephalopathy  Acute respiratory failure with hypoxia ([place])  Brain mass    Rx / DC Orders ED Discharge Orders    None       Luna Fuse, MD 05/06/20 2030

## 2020-05-06 NOTE — ED Notes (Signed)
MD has been made aware multiple time concerning patient HR and rhythm. Patient sustaining 150s-190s.

## 2020-05-06 NOTE — ED Notes (Signed)
Handoff given to [place] RN  Patient waiting for bed to be cleaned, report called to floor.

## 2020-05-07 ENCOUNTER — Inpatient Hospital Stay (HOSPITAL_COMMUNITY): Payer: Medicare Other

## 2020-05-07 DIAGNOSIS — Z85841 Personal history of malignant neoplasm of brain: Secondary | ICD-10-CM

## 2020-05-07 DIAGNOSIS — J9601 Acute respiratory failure with hypoxia: Secondary | ICD-10-CM | POA: Diagnosis not present

## 2020-05-07 DIAGNOSIS — G40509 Epileptic seizures related to external causes, not intractable, without status epilepticus: Secondary | ICD-10-CM

## 2020-05-07 DIAGNOSIS — L8990 Pressure ulcer of unspecified site, unspecified stage: Secondary | ICD-10-CM | POA: Insufficient documentation

## 2020-05-07 LAB — CBC
HCT: 36.3 % — ABNORMAL LOW (ref 39.0–52.0)
Hemoglobin: 12.8 g/dL — ABNORMAL LOW (ref 13.0–17.0)
MCH: 30.9 pg (ref 26.0–34.0)
MCHC: 35.3 g/dL (ref 30.0–36.0)
MCV: 87.7 fL (ref 80.0–100.0)
Platelets: 117 10*3/uL — ABNORMAL LOW (ref 150–400)
RBC: 4.14 MIL/uL — ABNORMAL LOW (ref 4.22–5.81)
RDW: 14.6 % (ref 11.5–15.5)
WBC: 5.5 10*3/uL (ref 4.0–10.5)
nRBC: 0.6 % — ABNORMAL HIGH (ref 0.0–0.2)

## 2020-05-07 LAB — COMPREHENSIVE METABOLIC PANEL
ALT: 23 U/L (ref 0–44)
AST: 20 U/L (ref 15–41)
Albumin: 2.9 g/dL — ABNORMAL LOW (ref 3.5–5.0)
Alkaline Phosphatase: 67 U/L (ref 38–126)
Anion gap: 12 (ref 5–15)
BUN: 20 mg/dL (ref 8–23)
CO2: 23 mmol/L (ref 22–32)
Calcium: 8.6 mg/dL — ABNORMAL LOW (ref 8.9–10.3)
Chloride: 103 mmol/L (ref 98–111)
Creatinine, Ser: 0.66 mg/dL (ref 0.61–1.24)
GFR, Estimated: >60 mL/min (ref >60)
Glucose, Bld: 217 mg/dL — ABNORMAL HIGH (ref 70–99)
Potassium: 3.8 mmol/L (ref 3.5–5.1)
Sodium: 138 mmol/L (ref 135–145)
Total Bilirubin: 1.1 mg/dL (ref 0.3–1.2)
Total Protein: 5.1 g/dL — ABNORMAL LOW (ref 6.5–8.1)

## 2020-05-07 LAB — SAR COV2 SEROLOGY (COVID19)AB(IGG),IA: SARS-CoV-2 Ab, IgG: REACTIVE — AB

## 2020-05-07 LAB — MAGNESIUM: Magnesium: 1.6 mg/dL — ABNORMAL LOW (ref 1.7–2.4)

## 2020-05-07 LAB — PROCALCITONIN: Procalcitonin: 1.21 ng/mL

## 2020-05-07 LAB — MRSA PCR SCREENING: MRSA by PCR: NEGATIVE

## 2020-05-07 LAB — STREP PNEUMONIAE URINARY ANTIGEN: Strep Pneumo Urinary Antigen: NEGATIVE

## 2020-05-07 LAB — GLUCOSE, CAPILLARY
Glucose-Capillary: 188 mg/dL — ABNORMAL HIGH (ref 70–99)
Glucose-Capillary: 178 mg/dL — ABNORMAL HIGH (ref 70–99)
Glucose-Capillary: 163 mg/dL — ABNORMAL HIGH (ref 70–99)
Glucose-Capillary: 165 mg/dL — ABNORMAL HIGH (ref 70–99)

## 2020-05-07 LAB — LACTIC ACID, PLASMA: Lactic Acid, Venous: 2.9 mmol/L (ref 0.5–1.9)

## 2020-05-07 LAB — PHOSPHORUS: Phosphorus: 3.5 mg/dL (ref 2.5–4.6)

## 2020-05-07 MED ORDER — RINGERS IV SOLN: INTRAVENOUS | Status: DC

## 2020-05-07 MED ORDER — LACTATED RINGERS IV SOLN: INTRAVENOUS | Status: DC

## 2020-05-07 MED ORDER — CHLORHEXIDINE GLUCONATE 0.12% ORAL RINSE (MEDLINE KIT)
15.0000 mL | Freq: Two times a day (BID) | OROMUCOSAL | Status: DC
  Administered 2020-05-07 – 2020-05-12 (×11): 15 mL via OROMUCOSAL

## 2020-05-07 MED ORDER — MAGNESIUM SULFATE 4 GM/100ML IV SOLN
4.0000 g | Freq: Once | INTRAVENOUS | Status: AC
  Administered 2020-05-07: 4 g via INTRAVENOUS
  Filled 2020-05-07: qty 100

## 2020-05-07 MED ORDER — LACTATED RINGERS IV BOLUS
500.0000 mL | Freq: Once | INTRAVENOUS | Status: AC
  Administered 2020-05-07: 500 mL via INTRAVENOUS

## 2020-05-07 MED ORDER — CHLORHEXIDINE GLUCONATE CLOTH 2 % EX PADS
6.0000 | MEDICATED_PAD | Freq: Every day | CUTANEOUS | Status: DC
  Administered 2020-05-08 – 2020-05-10 (×2): 6 via TOPICAL

## 2020-05-07 MED ORDER — ORAL CARE MOUTH RINSE
15.0000 mL | OROMUCOSAL | Status: DC
  Administered 2020-05-07 – 2020-05-13 (×45): 15 mL via OROMUCOSAL

## 2020-05-07 NOTE — Progress Notes (Signed)
[facility] Progress Note Patient Name: [name] DOB: 1945-10-14 MRN: [id number]   Date of Service  05/07/2020  HPI/Events of Note  45 M history of glioblastoma failed chemo with plans for initiation of salvage therapy last December however had COVID infection. Recurrent seizures on Keppra presents with seizure and fell out of bed. Intubated in the ED as he remained unresponsive. CT with unchanged right occipital and midline shift.  eICU Interventions  Remain intubated until fully awake Loaded with Keppra and continued on dexamethasone Seizure precaution Glucose control     Intervention Category Major Interventions: Respiratory failure - evaluation and management;Seizures - evaluation and management Evaluation Type: New Patient Evaluation  Judd Lien 05/07/2020, 2:02 AM

## 2020-05-07 NOTE — Progress Notes (Signed)
Subjective: PAtient awakens easily, following commands.   Exam: Vitals:   05/07/20 1700 05/07/20 1800  BP: 110/76 119/79  Pulse: 74 93  Resp: 18 18  Temp: 99.86 F (37.7 C) 99.86 F (37.7 C)  SpO2: 100% 100%   Gen: In bed, intubated Resp: Ventilated Abd: soft, nt  Neuro: MS: awakens easily, follwos commands.  AS:NKNL, endorses vision in all 4 quadrants.  Motor: MAEW to command Sensory:intact to LT  Pertinent Labs: Cr 0.66  Impression: 75 year old male with breakthrough seizure secondary to tumor, now on increased dose of Keppra.  Recommendations: 1) continue Keppra 2 g twice daily 2) extubation per CCM  Roland Rack, MD [facility] [phone]  If 7pm- 7am, please page neurology on call as listed in [place].

## 2020-05-07 NOTE — Progress Notes (Signed)
[facility] ADULT ICU REPLACEMENT PROTOCOL   The patient does apply for the [facility] Adult ICU Electrolyte Replacment Protocol based on the criteria listed below:   1. Is GFR >/= 30 ml/min? Yes.    Patient's GFR today is >60 2. Is SCr </= 2? Yes.   Patient's SCr is 0.66 ml/kg/hr 3. Did SCr increase >/= 0.5 in 24 hours? No. 4. Abnormal electrolyte(s): Mg 1.6 5. Ordered repletion with: protocol 6. If a panic level lab has been reported, has the CCM MD in charge been notified? Yes.  .   Physician:  E. Verdie Shire R Lucely Leard 05/07/2020 5:11 AM

## 2020-05-07 NOTE — Progress Notes (Signed)
EEG complete - results pending 

## 2020-05-07 NOTE — Progress Notes (Signed)
Discussed with MD R. Agarwala decreasing UOP during AM rounds, IVF added.  1645 low UOP remains, MD R. Agarwala made aware, order to increase IVF and bolus placed.

## 2020-05-07 NOTE — Progress Notes (Signed)
NAME:  [name], MRN:  [id number], DOB:  Sep 02, 1945, LOS: 1 ADMISSION DATE:  05/06/2020, CONSULTATION DATE:  05/06/2020 REFERRING MD:  Dr. Almyra Free , CHIEF COMPLAINT:  Seizures, Unresponsive.    History of Present Illness:  75 year old male with glioblastoma s/p resection 06/2019. 1/17 Presents to ED with reported seizures, wife reports noting seizure activity patient then fell out of bed but did not hit head. After EMS arrived patient had 2 additional seizures. On arrival to ED remained unresponsive with GCS 3 requiring intubation. CT head with no acute, noted residual masslike density in the right occipital lobe with similar appearing mass effect with 4-5 mm midline shift. Given 1 gram of keppra and 4 mg decadron. Critical Care Consulted for admission.    Recent admission 03/2020 with recurrent seizures. Keppra increased to 1500 BID at this time. Reportedly over last year has declined physically as well with cognition and memory, needing increased amount of help with ADLS. Failed Chemo, last Nov 2021, Salvage Therapy planned to start. On 12/29 patient tested positive for COVID. On 12/31 received casirivimab\imdevimab infusion.   Past Medical History:  Recurrent Seizures, DM, Glioblastoma, HTN, Sinus Tachycardia   Significant Hospital Events:  12/31 > COVID + 1/17 > Presents to ED, Intubated.   Consults:  Neurology   Procedures:  ETT 1/17 >>  Significant Diagnostic Tests:  CXR 1/17 > Mild left basilar atelectasis or infiltrate is noted with small pleural effusion. Stable support apparatus. No pneumothorax is Noted. CT Head 1/17 > 1. Grossly similar appearance of the brain since head CT 03/16/2020. Residual masslike density in the right occipital lobe, likely involving splenium of corpus callosum. Similar surrounding edema and regional mass effect with about 4-5 mm midline shift to the left. No acute intracranial hemorrhage  Micro Data:  Blood 1/17 >> Urine 1/17 >>   Resp 1/17 >>  Antimicrobials:     Interim History / Subjective:  No further clinical or electroencephalographic seizures.  Patient will follow commands but at times agitated.  Objective   Blood pressure 106/71, pulse 85, temperature 99.68 F (37.6 C), resp. rate 15, height 5\' 11"  (1.803 m), weight 86.1 kg, SpO2 96 %.    Vent Mode: PRVC FiO2 (%):  [40 %-100 %] 50 % Set Rate:  [15 bmp] 15 bmp Vt Set:  [480 mL-600 mL] 600 mL PEEP:  [5 cmH20] 5 cmH20 Plateau Pressure:  [19 cmH20-22 cmH20] 19 cmH20   Intake/Output Summary (Last 24 hours) at 05/07/2020 1338 Last data filed at 05/07/2020 1200 Gross per 24 hour  Intake 1657.78 ml  Output 1110 ml  Net 547.78 ml   Filed Weights   05/06/20 1400 05/07/20 0124  Weight: 93 kg 86.1 kg    Examination: General: Elderly male on vent HENT: ETT/OG in place  Lungs: clear breath sound, vent assisted breaths, plateau pressure 20 Cardiovascular: RRR, no MRG Abdomen: Soft, non-tender, active bowel sounds  Extremities: -edema  Neuro: sedated, opens eyes to voice tracks.  Follows commands. GU: foley in place   Resolved Hospital Problem list     Assessment & Plan:   Critically ill due to recurrent seizures in setting of glioblastoma, now post-ictal  CT Head with no acute, epileptogenic focus seen on EEG but no generalized seizures -Continue current anticonvulsants -Wean sedation -Proceed to extubation when mental status allows.  Critically ill due to acute hypoxic respiratory failure due to status epilepticus -Wean sedation -SBT and extubate if tolerated.  COVID +  on 12/29 (reportedly from [facility]) s/p casirivimab\imdevimab infusion 12/31, but suspect minimal lung involvement given relatively normal chest x-ray and low oxygen requirements. -No Biologics -Complete course of steroid -Should come out of isolation 1/19  H/O HTN H/O Sinus Tachycardia  -Resume metoprolol and Norvasc once extubated  Hyperglycemia  H/O DM Previous AIC  8.2 -Not on a home regimen  -Trend Glucose with SSI q4h   Glioblastoma with isocitrate dehydrogenase gene wildtype - followed by [name], failed chemotherapy, s/p resection 06/2019, recently offered salvage therapy vs hospice. Family wanting to continue treatments at this time.  -Continue scheduled decadron, keppra as above.   Best practice (evaluated daily)  Diet: NPO. Start TF  Pain/Anxiety/Delirium protocol (if indicated) VAP protocol (if indicated) DVT prophylaxis: Heparin sq  GI prophylaxis: PPI Glucose control: SSI  Mobility: Bedrest  Disposition:Admit to ICU   Goals of Care:  Last date of multidisciplinary goals of care discussion: Wife updated 1/18. Family and staff present: Wife  Summary of discussion: Full Code  Follow up goals of care discussion due: 1/24 Code Status: Full Code   Labs   CBC: Recent Labs  Lab 05/06/20 1353 05/06/20 1621 05/07/20 0320  WBC 5.8  --  5.5  NEUTROABS 4.3  --   --   HGB 13.9 12.2* 12.8*  HCT 43.3 36.0* 36.3*  MCV 92.1  --  87.7  PLT 155  --  117*    Basic Metabolic Panel: Recent Labs  Lab 05/06/20 1353 05/06/20 1621 05/06/20 1924 05/07/20 0320  NA 136 135  --  138  K 4.4 3.9  --  3.8  CL 100  --   --  103  CO2 20*  --   --  23  GLUCOSE 377*  --   --  217*  BUN 24*  --   --  20  CREATININE 0.89  --   --  0.66  CALCIUM 9.1  --   --  8.6*  MG  --   --  1.6* 1.6*  PHOS  --   --  3.8 3.5   GFR: Estimated Creatinine Clearance: 86.3 mL/min (by C-G formula based on SCr of 0.66 mg/dL). Recent Labs  Lab 05/06/20 1353 05/06/20 1923 05/06/20 1924 05/07/20 0320  PROCALCITON  --   --  0.58 1.21  WBC 5.8  --   --  5.5  LATICACIDVEN  --  2.3*  --  2.9*    Liver Function Tests: Recent Labs  Lab 05/06/20 1353 05/07/20 0320  AST 31 20  ALT 29 23  ALKPHOS 85 67  BILITOT 1.4* 1.1  PROT 6.3* 5.1*  ALBUMIN 3.5 2.9*   No results for input(s): LIPASE, AMYLASE in the last 168 hours. No results for input(s): AMMONIA in  the last 168 hours.  ABG    Component Value Date/Time   PHART 7.309 (L) 05/06/2020 1621   PCO2ART 45.2 05/06/2020 1621   PO2ART 127 (H) 05/06/2020 1621   HCO3 22.8 05/06/2020 1621   TCO2 24 05/06/2020 1621   ACIDBASEDEF 4.0 (H) 05/06/2020 1621   O2SAT 99.0 05/06/2020 1621     Coagulation Profile: No results for input(s): INR, PROTIME in the last 168 hours.  Cardiac Enzymes: No results for input(s): CKTOTAL, CKMB, CKMBINDEX, TROPONINI in the last 168 hours.  HbA1C: Hgb A1c MFr Bld  Date/Time Value Ref Range Status  03/18/2020 10:16 AM 8.2 (H) 4.8 - 5.6 % Final    Comment:    (NOTE) Pre diabetes:  5.7%-6.4%  Diabetes:              >6.4%  Glycemic control for   <7.0% adults with diabetes   10/08/2019 07:19 PM 6.9 (H) 4.8 - 5.6 % Final    Comment:    (NOTE)         Prediabetes: 5.7 - 6.4         Diabetes: >6.4         Glycemic control for adults with diabetes: <7.0     CBG: Recent Labs  Lab 05/06/20 2138 05/07/20 0453 05/07/20 0839  GLUCAP 320* 188* 178*   CRITICAL CARE Performed by: Kipp Brood   Total critical care time: 40 minutes  Critical care time was exclusive of separately billable procedures and treating other patients.  Critical care was necessary to treat or prevent imminent or life-threatening deterioration.  Critical care was time spent personally by me on the following activities: development of treatment plan with patient and/or surrogate as well as nursing, discussions with consultants, evaluation of patient's response to treatment, examination of patient, obtaining history from patient or surrogate, ordering and performing treatments and interventions, ordering and review of laboratory studies, ordering and review of radiographic studies, pulse oximetry, re-evaluation of patient's condition and participation in multidisciplinary rounds.  Kipp Brood, MD [facility] ICU Physician [place]  Pager:  [phone] Mobile: [phone] After hours: [phone].

## 2020-05-07 NOTE — Progress Notes (Signed)
Patient transported to 4N23 without incidence.

## 2020-05-07 NOTE — Progress Notes (Signed)
Assisted tele visit to patient with wife,Geraldine.  Maryelizabeth Rowan, RN

## 2020-05-07 NOTE — Procedures (Signed)
Patient Name: [name]  MRN: [id number]  Epilepsy Attending: Lora Havens  Referring Physician/Provider: Dr. Lynnae Sandhoff Date: 05/07/2020 Duration: 23.49 mins  Patient history: 75 year old male with known glioma status postresection, epilepsy who presented with breakthrough seizure. EEG to evaluate for seizures.  Level of alertness: Lethargic  AEDs during EEG study: LEV, propofol  Technical aspects: This EEG study was done with scalp electrodes positioned according to the 10-20 International system of electrode placement. Electrical activity was acquired at a sampling rate of 500Hz  and reviewed with a high frequency filter of 70Hz  and a low frequency filter of 1Hz . EEG data were recorded continuously and digitally stored.   Description: No clear posterior dominant rhythm was seen. EEG showed continuous generalized 3 to 5 Hz theta-delta delta slowing admixed with 15 to 18 Hz beta activity.  Sharp waves were also seen in right temporoparietal region, maximal T8 >P4. Hyperventilation and photic stimulation were not performed.     ABNORMALITY -Sharp waves, right temporoparietal region -Continuous slow, generalized  IMPRESSION: This study showed evidence of epileptogenicity arising from right temporoparietal region.  Additionally there is also moderate diffuse encephalopathy, nonspecific etiology. No seizures were seen throughout the recording.   Onofre Gains Barbra Sarks

## 2020-05-08 LAB — BASIC METABOLIC PANEL
Anion gap: 10 (ref 5–15)
BUN: 19 mg/dL (ref 8–23)
CO2: 23 mmol/L (ref 22–32)
Calcium: 8.5 mg/dL — ABNORMAL LOW (ref 8.9–10.3)
Chloride: 103 mmol/L (ref 98–111)
Creatinine, Ser: 0.59 mg/dL — ABNORMAL LOW (ref 0.61–1.24)
GFR, Estimated: >60 mL/min (ref >60)
Glucose, Bld: 117 mg/dL — ABNORMAL HIGH (ref 70–99)
Potassium: 3.7 mmol/L (ref 3.5–5.1)
Sodium: 136 mmol/L (ref 135–145)

## 2020-05-08 LAB — GLUCOSE, CAPILLARY
Glucose-Capillary: 103 mg/dL — ABNORMAL HIGH (ref 70–99)
Glucose-Capillary: 119 mg/dL — ABNORMAL HIGH (ref 70–99)
Glucose-Capillary: 128 mg/dL — ABNORMAL HIGH (ref 70–99)
Glucose-Capillary: 140 mg/dL — ABNORMAL HIGH (ref 70–99)
Glucose-Capillary: 79 mg/dL (ref 70–99)
Glucose-Capillary: 88 mg/dL (ref 70–99)
Glucose-Capillary: 95 mg/dL (ref 70–99)
Glucose-Capillary: 98 mg/dL (ref 70–99)

## 2020-05-08 LAB — PROCALCITONIN: Procalcitonin: 0.54 ng/mL

## 2020-05-08 MED ORDER — AMLODIPINE BESYLATE 10 MG PO TABS: 10.0000 mg | ORAL_TABLET | Freq: Every evening | ORAL | Status: DC

## 2020-05-08 MED ORDER — FENTANYL CITRATE (PF) 100 MCG/2ML IJ SOLN
12.5000 ug | INTRAMUSCULAR | Status: DC | PRN
  Administered 2020-05-10: 12.5 ug via INTRAVENOUS
  Filled 2020-05-08: qty 2

## 2020-05-08 MED ORDER — CHLORHEXIDINE GLUCONATE 0.12 % MT SOLN
OROMUCOSAL | Status: AC
  Filled 2020-05-08: qty 15

## 2020-05-08 MED ORDER — ATORVASTATIN CALCIUM 80 MG PO TABS
80.0000 mg | ORAL_TABLET | Freq: Every day | ORAL | Status: DC
Start: 2020-05-08 — End: 2020-05-13
  Administered 2020-05-10 – 2020-05-12 (×4): 80 mg via ORAL
  Filled 2020-05-08 (×5): qty 1

## 2020-05-08 MED ORDER — DEXAMETHASONE 2 MG PO TABS
2.0000 mg | ORAL_TABLET | Freq: Every day | ORAL | Status: DC
  Administered 2020-05-09 – 2020-05-13 (×5): 2 mg via ORAL
  Filled 2020-05-08 (×5): qty 1

## 2020-05-08 MED ORDER — METOPROLOL SUCCINATE ER 25 MG PO TB24
12.5000 mg | ORAL_TABLET | Freq: Every evening | ORAL | Status: DC
Start: 2020-05-08 — End: 2020-05-13
  Administered 2020-05-08 – 2020-05-12 (×5): 12.5 mg via ORAL
  Filled 2020-05-08 (×6): qty 1

## 2020-05-08 MED ORDER — LORAZEPAM 2 MG/ML IJ SOLN
1.0000 mg | INTRAMUSCULAR | Status: DC | PRN
  Administered 2020-05-08 – 2020-05-13 (×6): 1 mg via INTRAVENOUS
  Filled 2020-05-08 (×5): qty 1

## 2020-05-08 MED ORDER — METOPROLOL TARTRATE 5 MG/5ML IV SOLN
5.0000 mg | INTRAVENOUS | Status: DC | PRN
  Filled 2020-05-08: qty 5

## 2020-05-08 MED ORDER — LORAZEPAM 2 MG/ML IJ SOLN
INTRAMUSCULAR | Status: AC
  Filled 2020-05-08: qty 1

## 2020-05-08 MED ORDER — DEXMEDETOMIDINE HCL IN NACL 400 MCG/100ML IV SOLN
0.4000 ug/kg/h | INTRAVENOUS | Status: DC
  Administered 2020-05-08: 0.6 ug/kg/h via INTRAVENOUS
  Administered 2020-05-08: 0.4 ug/kg/h via INTRAVENOUS
  Filled 2020-05-08: qty 100

## 2020-05-08 MED ORDER — LEVETIRACETAM 500 MG PO TABS
1000.0000 mg | ORAL_TABLET | Freq: Two times a day (BID) | ORAL | Status: DC
  Filled 2020-05-08: qty 2

## 2020-05-08 MED ORDER — DEXAMETHASONE 4 MG PO TABS
4.0000 mg | ORAL_TABLET | Freq: Every evening | ORAL | Status: DC
Start: 2020-05-08 — End: 2020-05-13
  Administered 2020-05-08 – 2020-05-12 (×5): 4 mg via ORAL
  Filled 2020-05-08 (×6): qty 1

## 2020-05-08 MED ORDER — CALCIUM CITRATE 950 (200 CA) MG PO TABS
200.0000 mg | ORAL_TABLET | Freq: Every day | ORAL | Status: DC
  Administered 2020-05-09 – 2020-05-13 (×5): 200 mg via ORAL
  Filled 2020-05-08 (×5): qty 1

## 2020-05-08 MED ORDER — PANTOPRAZOLE SODIUM 40 MG PO TBEC: 40.0000 mg | DELAYED_RELEASE_TABLET | Freq: Every day | ORAL | Status: DC

## 2020-05-08 MED ORDER — CALCIUM CITRATE 950 (200 CA) MG PO TABS
200.0000 mg | ORAL_TABLET | Freq: Every day | ORAL | Status: DC
  Filled 2020-05-08: qty 1

## 2020-05-08 MED ORDER — LEVETIRACETAM 750 MG PO TABS: 1500.0000 mg | ORAL_TABLET | Freq: Two times a day (BID) | ORAL | Status: DC

## 2020-05-08 MED ORDER — DEXMEDETOMIDINE HCL IN NACL 400 MCG/100ML IV SOLN: 0.4000 ug/kg/h | INTRAVENOUS | Status: DC

## 2020-05-08 MED ORDER — AMLODIPINE BESYLATE 10 MG PO TABS
10.0000 mg | ORAL_TABLET | Freq: Every evening | ORAL | Status: DC
Start: 2020-05-08 — End: 2020-05-13
  Administered 2020-05-08 – 2020-05-12 (×5): 10 mg via ORAL
  Filled 2020-05-08 (×6): qty 1

## 2020-05-08 MED ORDER — LEVETIRACETAM IN NACL 1000 MG/100ML IV SOLN
1000.0000 mg | Freq: Two times a day (BID) | INTRAVENOUS | Status: DC
  Administered 2020-05-08 – 2020-05-09 (×2): 1000 mg via INTRAVENOUS
  Filled 2020-05-08 (×2): qty 100

## 2020-05-08 NOTE — Progress Notes (Signed)
Assisted tele visit to patient with wife.  Nariyah Osias R Lelania Bia, RN   

## 2020-05-08 NOTE — Progress Notes (Signed)
Subjective: Patient was agitated earlier, recioeved ativan.   Exam: Vitals:   05/08/20 1000 05/08/20 1100  BP:  (!) 83/55  Pulse: 97 86  Resp: (!) 28 (!) 22  Temp: 98.6 F (37 C) 98.42 F (36.9 C)  SpO2: 99% 96%   Gen: In bed, intubated Resp: Ventilated Abd: soft, nt  Neuro: MS: awakens easily, follwos commands.  BU:LAGT, endorses visio Motor: MAEW to command Sensory:intact to LT  Pertinent Labs: Cr 0.66  Impression: 75 year old male with breakthrough seizure secondary to tumor, now on increased dose of Keppra.  He seems significantly more lethargic, and with description of periods of agitation, I do think I would favor ruling out intermittent subclinical seizures and therefore I will order an overnight EEG.  Recommendations: 1) continue Keppra 2 g twice daily 2) overnight EEG  Roland Rack, MD [facility] [phone]  If 7pm- 7am, please page neurology on call as listed in [place].

## 2020-05-08 NOTE — Progress Notes (Signed)
LTM EEG hooked up and running - no initial skin breakdown - push button tested - neuro notified.  

## 2020-05-08 NOTE — Consult Note (Signed)
WOC Nurse Consult Note: Reason for Consult: Stage 2 pressure injury to sacrum Wound type:pressure Pressure Injury POA: Yes Measurement:To be obtained by Bedside RN S. Lemar Livings today and documented on Nursing Flow Sheet Wound YBO:FBPZ, moist Drainage (amount, consistency, odor) small serous Periwound:intact Dressing procedure/placement/frequency:I have added a silver hydrofiber (Aquacel Advantage to this area as patient's wife notes that is it present from home but worsening (she had it nearly healed) I will additionally provide a pressure redistribution chair pad for home use. While in house, we will minimize time in the supine position.  [place] nursing team will not follow, but will remain available to this patient, the nursing and medical teams.  Please re-consult if needed. Thanks, Maudie Flakes, MSN, RN, [place], [facility]  Pager# [phone]

## 2020-05-08 NOTE — Progress Notes (Signed)
eLink Physician-Brief Progress Note Patient Name: SPERO GUNNELS DOB: 03-05-1946 MRN: [id number]   Date of Service  05/08/2020  HPI/Events of Note  Patient too drowsy to safely take his medications orally.  eICU Interventions  Keppra switched to iv for now and other medications held overnight, pending a return to his baseline neuro status vs placement of a Dobbhoff feeding tube.         Kerry Kass Amaira Safley 05/08/2020, 10:47 PM

## 2020-05-08 NOTE — Procedures (Signed)
Extubation Procedure Note  Patient Details:   Name: [name] DOB: 1946-03-16 MRN: [id number]   Airway Documentation:    Vent end date: 05/08/20 Vent end time: 0837   Evaluation  O2 sats: transiently fell during during procedure Complications: Complications of low sats and increased heart after extubation  Patient did tolerate procedure well. Bilateral Breath Sounds: Coarse crackles   Yes   RT extubated pt per MD order. Pt inially placed on 4L [place] but pts sats hanging around 88%. Placed pt on NRB 15L. Sats increased to 96%. MD made aware of pts agitation and O2 requirements. RT will cont. To monitor.   Truth Wolaver Susa Raring 05/08/2020, 9:19 AM

## 2020-05-08 NOTE — Progress Notes (Signed)
NAME:  [name], MRN:  [id number], DOB:  December 22, 1945, LOS: 2 ADMISSION DATE:  05/06/2020, CONSULTATION DATE:  05/06/2020 REFERRING MD:  Dr. Almyra Free , CHIEF COMPLAINT:  Seizures, Unresponsive.    History of Present Illness:  75 year old male with glioblastoma s/p resection 06/2019. 1/17 Presents to ED with reported seizures, wife reports noting seizure activity patient then fell out of bed but did not hit head. After EMS arrived patient had 2 additional seizures. On arrival to ED remained unresponsive with GCS 3 requiring intubation. CT head with no acute, noted residual masslike density in the right occipital lobe with similar appearing mass effect with 4-5 mm midline shift. Given 1 gram of keppra and 4 mg decadron. Critical Care Consulted for admission.    Recent admission 03/2020 with recurrent seizures. Keppra increased to 1500 BID at this time. Reportedly over last year has declined physically as well with cognition and memory, needing increased amount of help with ADLS. Failed Chemo, last Nov 2021, Salvage Therapy planned to start. On 12/29 patient tested positive for COVID. On 12/31 received casirivimab\imdevimab infusion.   Past Medical History:  Recurrent Seizures, DM, Glioblastoma, HTN, Sinus Tachycardia   Significant Hospital Events:  12/31 > COVID + 1/17 > Presents to ED, Intubated.   Consults:  Neurology   Procedures:  ETT 1/17 >>  Significant Diagnostic Tests:  CXR 1/17 > Mild left basilar atelectasis or infiltrate is noted with small pleural effusion. Stable support apparatus. No pneumothorax is Noted. CT Head 1/17 > 1. Grossly similar appearance of the brain since head CT 03/16/2020. Residual masslike density in the right occipital lobe, likely involving splenium of corpus callosum. Similar surrounding edema and regional mass effect with about 4-5 mm midline shift to the left. No acute intracranial hemorrhage  Micro Data:  Blood 1/17 >> Urine 1/17 >>   Resp 1/17 >>  Antimicrobials:     Interim History / Subjective:  No further clinical or electroencephalographic seizures.  Patient agitated with sedation wean. Extubated after abbreviated SBT. Confused thereafter, refusing oxygen but requesting not to be re-intubated but without insight into condition.   Objective   Blood pressure (!) 83/55, pulse 86, temperature 98.42 F (36.9 C), resp. rate (!) 22, height 5\' 11"  (1.803 m), weight 86.1 kg, SpO2 96 %.    Vent Mode: PSV FiO2 (%):  [40 %-50 %] 40 % Set Rate:  [15 bmp] 15 bmp Vt Set:  [600 mL] 600 mL PEEP:  [5 cmH20] 5 cmH20 Pressure Support:  [5 cmH20] 5 cmH20 Plateau Pressure:  [15 cmH20-22 cmH20] 22 cmH20   Intake/Output Summary (Last 24 hours) at 05/08/2020 1130 Last data filed at 05/08/2020 1100 Gross per 24 hour  Intake 3521.94 ml  Output 460 ml  Net 3061.94 ml   Filed Weights   05/06/20 1400 05/07/20 0124  Weight: 93 kg 86.1 kg    Examination: General: Elderly male, extubated.  HENT: no oral trauma. Lungs: clear breath sound, on NRB mas.  Cardiovascular: tachycardic in atrial fibrilation.  Abdomen: Soft, non-tender, active bowel sounds  Extremities: -edema  Neuro: awake and follows commands able to talk in full sentences, but periods of inattention.  GU: Condom catheter in place.   Resolved Hospital Problem list     Assessment & Plan:   Was critically ill due to recurrent seizures in setting of glioblastoma CT Head with no acute, epileptogenic focus seen on EEG but no generalized seizures Encephalopathic still with syndrome consistent with  hyperactive delirium, likely due to prolonged sedative effects on compromised brain. -Continue current anticonvulsants -Patient more compliant on combination of Precedex and prn Lorazepam.   Was critically ill due to acute hypoxic respiratory failure due to status epilepticus -[id number] - Pulmonary toilet  COVID + on 12/29 ([facility] from [facility]) s/p casirivimab\imdevimab  infusion 12/31, but suspect minimal lung involvement given relatively normal chest x-ray and low oxygen requirements. -No Biologics -Complete course of steroid -Isolation discontinued today. Allow wife to visit.   H/O HTN H/O Sinus Tachycardia  -Resume metoprolol and Norvasc once extubated  Hyperglycemia  H/O DM Previous AIC 8.2 -Not on a home regimen  -Trend Glucose with SSI q4h   Glioblastoma with isocitrate dehydrogenase gene wildtype - followed by [name], failed chemotherapy, s/p resection 06/2019, recently offered salvage therapy vs hospice. Family wanting to continue treatments at this time.  -Continue scheduled decadron, keppra as above.   Best practice (evaluated daily)  Diet: swallow evaluation.  Pain/Anxiety/Delirium protocol (if indicated) precedex and lorazepam prn VAP protocol (if indicated) - incentive spirometry.  DVT prophylaxis: Heparin sq  GI prophylaxis: PPI Glucose control: SSI  Mobility: Progressive ambulation  Disposition: ICU - progress to PCU tomorrow if delirium clears.   Goals of Care:  Last date of multidisciplinary goals of care discussion: Wife updated 1/19. Family and staff present: Wife  Summary of discussion: Full Code  Follow up goals of care discussion due: 1/24 Code Status: Full Code   Labs   CBC: Recent Labs  Lab 05/06/20 1353 05/06/20 1621 05/07/20 0320  WBC 5.8  --  5.5  NEUTROABS 4.3  --   --   HGB 13.9 12.2* 12.8*  HCT 43.3 36.0* 36.3*  MCV 92.1  --  87.7  PLT 155  --  117*    Basic Metabolic Panel: Recent Labs  Lab 05/06/20 1353 05/06/20 1621 05/06/20 1924 05/07/20 0320 05/08/20 0401  NA 136 135  --  138 136  K 4.4 3.9  --  3.8 3.7  CL 100  --   --  103 103  CO2 20*  --   --  23 23  GLUCOSE 377*  --   --  217* 117*  BUN 24*  --   --  20 19  CREATININE 0.89  --   --  0.66 0.59*  CALCIUM 9.1  --   --  8.6* 8.5*  MG  --   --  1.6* 1.6*  --   PHOS  --   --  3.8 3.5  --    GFR: Estimated Creatinine Clearance:  86.3 mL/min (A) (by C-G formula based on SCr of 0.59 mg/dL (L)). Recent Labs  Lab 05/06/20 1353 05/06/20 1923 05/06/20 1924 05/07/20 0320 05/08/20 0401  PROCALCITON  --   --  0.58 1.21 0.54  WBC 5.8  --   --  5.5  --   LATICACIDVEN  --  2.3*  --  2.9*  --     Liver Function Tests: Recent Labs  Lab 05/06/20 1353 05/07/20 0320  AST 31 20  ALT 29 23  ALKPHOS 85 67  BILITOT 1.4* 1.1  PROT 6.3* 5.1*  ALBUMIN 3.5 2.9*   No results for input(s): LIPASE, AMYLASE in the last 168 hours. No results for input(s): AMMONIA in the last 168 hours.  ABG    Component Value Date/Time   PHART 7.309 (L) 05/06/2020 1621   PCO2ART 45.2 05/06/2020 1621   PO2ART 127 (H) 05/06/2020 1621   HCO3 22.8 05/06/2020  1621   TCO2 24 05/06/2020 1621   ACIDBASEDEF 4.0 (H) 05/06/2020 1621   O2SAT 99.0 05/06/2020 1621     Coagulation Profile: No results for input(s): INR, PROTIME in the last 168 hours.  Cardiac Enzymes: No results for input(s): CKTOTAL, CKMB, CKMBINDEX, TROPONINI in the last 168 hours.  HbA1C: Hgb A1c MFr Bld  Date/Time Value Ref Range Status  03/18/2020 10:16 AM 8.2 (H) 4.8 - 5.6 % Final    Comment:    (NOTE) Pre diabetes:          5.7%-6.4%  Diabetes:              >6.4%  Glycemic control for   <7.0% adults with diabetes   10/08/2019 07:19 PM 6.9 (H) 4.8 - 5.6 % Final    Comment:    (NOTE)         Prediabetes: 5.7 - 6.4         Diabetes: >6.4         Glycemic control for adults with diabetes: <7.0     CBG: Recent Labs  Lab 05/07/20 1622 05/07/20 2039 05/08/20 0004 05/08/20 0515 05/08/20 0806  GLUCAP 165* 79 88 128* 95   CRITICAL CARE Performed by: Kipp Brood   Total critical care time: 50 minutes  Critical care time was exclusive of separately billable procedures and treating other patients.  Critical care was necessary to treat or prevent imminent or life-threatening deterioration.  Critical care was time spent personally by me on the following  activities: development of treatment plan with patient and/or surrogate as well as nursing, discussions with consultants, evaluation of patient's response to treatment, examination of patient, obtaining history from patient or surrogate, ordering and performing treatments and interventions, ordering and review of laboratory studies, ordering and review of radiographic studies, pulse oximetry, re-evaluation of patient's condition and participation in multidisciplinary rounds.  Kipp Brood, MD [facility] ICU Physician [place]  Pager: [phone] Mobile: [phone] After hours: [phone].

## 2020-05-09 DIAGNOSIS — R569 Unspecified convulsions: Secondary | ICD-10-CM

## 2020-05-09 DIAGNOSIS — J9601 Acute respiratory failure with hypoxia: Secondary | ICD-10-CM | POA: Diagnosis not present

## 2020-05-09 LAB — GLUCOSE, CAPILLARY
Glucose-Capillary: 109 mg/dL — ABNORMAL HIGH (ref 70–99)
Glucose-Capillary: 95 mg/dL (ref 70–99)
Glucose-Capillary: 84 mg/dL (ref 70–99)
Glucose-Capillary: 162 mg/dL — ABNORMAL HIGH (ref 70–99)
Glucose-Capillary: 169 mg/dL — ABNORMAL HIGH (ref 70–99)

## 2020-05-09 MED ORDER — LEVETIRACETAM 500 MG PO TABS: 1000.0000 mg | ORAL_TABLET | Freq: Two times a day (BID) | ORAL | Status: DC

## 2020-05-09 MED ORDER — LEVETIRACETAM 750 MG PO TABS
2000.0000 mg | ORAL_TABLET | Freq: Two times a day (BID) | ORAL | Status: DC
  Administered 2020-05-10 – 2020-05-13 (×8): 2000 mg via ORAL
  Filled 2020-05-09 (×8): qty 2

## 2020-05-09 MED ORDER — INSULIN ASPART 100 UNIT/ML ~~LOC~~ SOLN
0.0000 [IU] | Freq: Four times a day (QID) | SUBCUTANEOUS | Status: DC
  Administered 2020-05-09: 2 [IU] via SUBCUTANEOUS
  Administered 2020-05-10: 3 [IU] via SUBCUTANEOUS
  Administered 2020-05-10: 2 [IU] via SUBCUTANEOUS
  Administered 2020-05-10: 3 [IU] via SUBCUTANEOUS
  Administered 2020-05-10 (×2): 2 [IU] via SUBCUTANEOUS
  Administered 2020-05-11 (×2): 3 [IU] via SUBCUTANEOUS
  Administered 2020-05-11 – 2020-05-12 (×4): 2 [IU] via SUBCUTANEOUS
  Administered 2020-05-12: 1 [IU] via SUBCUTANEOUS
  Administered 2020-05-13: 3 [IU] via SUBCUTANEOUS
  Administered 2020-05-13: 5 [IU] via SUBCUTANEOUS

## 2020-05-09 MED ORDER — PYRIDOXINE HCL 100 MG/ML IJ SOLN
100.0000 mg | Freq: Every day | INTRAMUSCULAR | Status: DC
  Administered 2020-05-09 – 2020-05-13 (×5): 100 mg via INTRAVENOUS
  Filled 2020-05-09 (×5): qty 1

## 2020-05-09 NOTE — Progress Notes (Signed)
NAME:  [name], MRN:  [id number], DOB:  1945-08-19, LOS: 3 ADMISSION DATE:  05/06/2020, CONSULTATION DATE:  05/06/2020 REFERRING MD:  Dr. Almyra Free , CHIEF COMPLAINT:  Seizures, Unresponsive.    History of Present Illness:  75 year old male with glioblastoma s/p resection 06/2019. 1/17 Presents to ED with reported seizures, wife reports noting seizure activity patient then fell out of bed but did not hit head. After EMS arrived patient had 2 additional seizures. On arrival to ED remained unresponsive with GCS 3 requiring intubation. CT head with no acute, noted residual masslike density in the right occipital lobe with similar appearing mass effect with 4-5 mm midline shift. Given 1 gram of keppra and 4 mg decadron. Critical Care Consulted for admission.    Recent admission 03/2020 with recurrent seizures. Keppra increased to 1500 BID at this time. Reportedly over last year has declined physically as well with cognition and memory, needing increased amount of help with ADLS. Failed Chemo, last Nov 2021, Salvage Therapy planned to start. On 12/29 patient tested positive for COVID. On 12/31 received casirivimab\imdevimab infusion.   Past Medical History:  Recurrent Seizures, DM, Glioblastoma, HTN, Sinus Tachycardia   Significant Hospital Events:  12/31 > COVID + 1/17 > Presents to ED, Intubated.   Consults:  Neurology   Procedures:  ETT 1/17 >>  Significant Diagnostic Tests:  CXR 1/17 > Mild left basilar atelectasis or infiltrate is noted with small pleural effusion. Stable support apparatus. No pneumothorax is Noted. CT Head 1/17 > 1. Grossly similar appearance of the brain since head CT 03/16/2020. Residual masslike density in the right occipital lobe, likely involving splenium of corpus callosum. Similar surrounding edema and regional mass effect with about 4-5 mm midline shift to the left. No acute intracranial hemorrhage  Micro Data:  Blood 1/17 >> Urine 1/17 >>  Resp 1/17  >>  Antimicrobials:     Interim History / Subjective:  Extubated yesterday. Agitated initially post extubation requiring precedex, now off and calm. Some impulsivity which was apparently present at home.  Hypoxia from poor cough improving weaned to 2lpm.  Objective   Blood pressure (!) 144/79, pulse 82, temperature 97.7 F (36.5 C), temperature source Axillary, resp. rate (!) 22, height 5\' 11"  (1.803 m), weight 86.1 kg, SpO2 92 %.        Intake/Output Summary (Last 24 hours) at 05/09/2020 1253 Last data filed at 05/09/2020 0600 Gross per 24 hour  Intake 2355.66 ml  Output 675 ml  Net 1680.66 ml   Filed Weights   05/06/20 1400 05/07/20 0124  Weight: 93 kg 86.1 kg    Examination: General: Elderly male, extubated.  HENT: no oral trauma. Lungs: clear breath sound, on nasal cannula, moist cough. Chest clear  Cardiovascular: tachycardic in atrial fibrilation.  Abdomen: Soft, non-tender, active bowel sounds  Extremities: -edema  Neuro: awake and follows commands able to talk in full sentences, but periods of inattention.  GU: Condom catheter in place.   Resolved Hospital Problem list     Assessment & Plan:   Was critically ill due to recurrent seizures in setting of glioblastoma CT Head with no acute, epileptogenic focus seen on EEG but no generalized seizures Encephalopathic still with syndrome consistent with hyperactive delirium, likely due to prolonged sedative effects on compromised brain. -Continue current anticonvulsants - Pyridoxine added by Neurology/.    Was critically ill due to acute hypoxic respiratory failure due to status epilepticus -Progressive ambulation.   COVID + on 12/29 (reportedly  from [facility]) s/p casirivimab\imdevimab infusion 12/31, but suspect minimal lung involvement given relatively normal chest x-ray and low oxygen requirements. -No Biologics -Complete course of steroid -Isolation discontinued 1/19.   H/O HTN H/O Sinus Tachycardia  -Resume  metoprolol and Norvasc once extubated  Hyperglycemia  H/O DM Previous AIC 8.2 -Not on a home regimen  -Trend Glucose with SSI q4h   Glioblastoma with isocitrate dehydrogenase gene wildtype - followed by [name], failed chemotherapy, s/p resection 06/2019, recently offered salvage therapy vs hospice. Family wanting to continue treatments at this time.  -Continue scheduled decadron, keppra as above.   Best practice (evaluated daily)  Diet: swallow evaluation.  Pain/Anxiety/Delirium protocol (if indicated) precedex and lorazepam prn VAP protocol (if indicated) - incentive spirometry.  DVT prophylaxis: Heparin sq  GI prophylaxis: PPI Glucose control: SSI  Mobility: Progressive ambulation  Disposition: Ready for PCU.   Goals of Care:  Last date of multidisciplinary goals of care discussion: Wife updated 1/19. Family and staff present: Wife  Summary of discussion: Full Code  Follow up goals of care discussion due: 1/24 Code Status: Full Code   Labs   CBC: Recent Labs  Lab 05/06/20 1353 05/06/20 1621 05/07/20 0320  WBC 5.8  --  5.5  NEUTROABS 4.3  --   --   HGB 13.9 12.2* 12.8*  HCT 43.3 36.0* 36.3*  MCV 92.1  --  87.7  PLT 155  --  117*    Basic Metabolic Panel: Recent Labs  Lab 05/06/20 1353 05/06/20 1621 05/06/20 1924 05/07/20 0320 05/08/20 0401  NA 136 135  --  138 136  K 4.4 3.9  --  3.8 3.7  CL 100  --   --  103 103  CO2 20*  --   --  23 23  GLUCOSE 377*  --   --  217* 117*  BUN 24*  --   --  20 19  CREATININE 0.89  --   --  0.66 0.59*  CALCIUM 9.1  --   --  8.6* 8.5*  MG  --   --  1.6* 1.6*  --   PHOS  --   --  3.8 3.5  --    GFR: Estimated Creatinine Clearance: 86.3 mL/min (A) (by C-G formula based on SCr of 0.59 mg/dL (L)). Recent Labs  Lab 05/06/20 1353 05/06/20 1923 05/06/20 1924 05/07/20 0320 05/08/20 0401  PROCALCITON  --   --  0.58 1.21 0.54  WBC 5.8  --   --  5.5  --   LATICACIDVEN  --  2.3*  --  2.9*  --     Liver Function  Tests: Recent Labs  Lab 05/06/20 1353 05/07/20 0320  AST 31 20  ALT 29 23  ALKPHOS 85 67  BILITOT 1.4* 1.1  PROT 6.3* 5.1*  ALBUMIN 3.5 2.9*   No results for input(s): LIPASE, AMYLASE in the last 168 hours. No results for input(s): AMMONIA in the last 168 hours.  ABG    Component Value Date/Time   PHART 7.309 (L) 05/06/2020 1621   PCO2ART 45.2 05/06/2020 1621   PO2ART 127 (H) 05/06/2020 1621   HCO3 22.8 05/06/2020 1621   TCO2 24 05/06/2020 1621   ACIDBASEDEF 4.0 (H) 05/06/2020 1621   O2SAT 99.0 05/06/2020 1621     Coagulation Profile: No results for input(s): INR, PROTIME in the last 168 hours.  Cardiac Enzymes: No results for input(s): CKTOTAL, CKMB, CKMBINDEX, TROPONINI in the last 168 hours.  HbA1C: Hgb A1c MFr Bld  Date/Time Value Ref Range Status  03/18/2020 10:16 AM 8.2 (H) 4.8 - 5.6 % Final    Comment:    (NOTE) Pre diabetes:          5.7%-6.4%  Diabetes:              >6.4%  Glycemic control for   <7.0% adults with diabetes   10/08/2019 07:19 PM 6.9 (H) 4.8 - 5.6 % Final    Comment:    (NOTE)         Prediabetes: 5.7 - 6.4         Diabetes: >6.4         Glycemic control for adults with diabetes: <7.0     CBG: Recent Labs  Lab 05/08/20 1951 05/08/20 2339 05/09/20 0337 05/09/20 0811 05/09/20 [place], MD [facility] ICU Physician [place]  Pager: [phone] Mobile: [phone] After hours: [phone].

## 2020-05-09 NOTE — Progress Notes (Addendum)
Subjective: No further seizures, episodes of agitation overnight.  Wife at bedside states patient has been having episodes of agitation since mid December.  Also states patient's Keppra dose was increased around the same time  ROS: negative except above  Examination  Vital signs in last 24 hours: Temp:  [97.5 F (36.4 C)-100.94 F (38.3 C)] 97.5 F (36.4 C) (01/20 0800) Pulse Rate:  [53-125] 64 (01/20 0600) Resp:  [19-31] 19 (01/20 0600) BP: (78-151)/(55-91) 115/73 (01/20 0600) SpO2:  [88 %-100 %] 96 % (01/20 0600)  General: Sitting in recliner, not in apparent distress. CVS: pulse-normal rate and rhythm RS: breathing comfortably, coarse breath sounds bilaterally Extremities: normal, warm Neuro: Awake, alert, oriented to self and place not to time (thinks it is October), follows all commands, able to name objects, left hemianopia, rest of the cranial nerves appear grossly intact, antigravity strength in all 4 extremities  Basic Metabolic Panel: Recent Labs  Lab 05/06/20 1353 05/06/20 1621 05/06/20 1924 05/07/20 0320 05/08/20 0401  NA 136 135  --  138 136  K 4.4 3.9  --  3.8 3.7  CL 100  --   --  103 103  CO2 20*  --   --  23 23  GLUCOSE 377*  --   --  217* 117*  BUN 24*  --   --  20 19  CREATININE 0.89  --   --  0.66 0.59*  CALCIUM 9.1  --   --  8.6* 8.5*  MG  --   --  1.6* 1.6*  --   PHOS  --   --  3.8 3.5  --     CBC: Recent Labs  Lab 05/06/20 1353 05/06/20 1621 05/07/20 0320  WBC 5.8  --  5.5  NEUTROABS 4.3  --   --   HGB 13.9 12.2* 12.8*  HCT 43.3 36.0* 36.3*  MCV 92.1  --  87.7  PLT 155  --  117*     Coagulation Studies: No results for input(s): LABPROT, INR in the last 72 hours.  Imaging CT head without contrast 05/06/2020: Grossly similar appearance of the brain since head CT 03/16/2020. Residual masslike density in the right occipital lobe, likely involving splenium of corpus callosum. Similar surrounding edema and regional mass effect with about  4-5 mm midline shift to the left. No acute intracranial hemorrhage.      ASSESSMENT AND PLAN: 75 year old male with breakthrough seizures in the setting of underlying tumor.  Epilepsy with breakthrough seizure Suspected Keppra related agitation -LTM EEG did not show any seizures overnight  Recommendation -It is possible that periods of agitation are related to adverse effects of Keppra -So far appears that Keppra has been working well in controlling patient's seizures.  Therefore would recommend continuing Keppra 2029m g twice daily -We will add pyridoxine 100 mg daily to help with possible Keppra related agitation. -Keppra and Pyridoxine can be switched to p.o. whenever appropriate per primary team -We will discontinue LTM EEG as there were no seizures overnight -If agitation persist, can consider switching to Briviact 200 mg twice daily for Vimpat  -Continue seizure precautions -As needed IV Ativan 2 mg for clinical seizures  Thank you for allowing [place] to participate in the care of this patient.  Neurology will follow peripherally.  Please call us with any further questions.  I have spent a total of  40  minutes with the patient reviewing hospital notes,  test results, labs and examining the patient as well as establishing  an assessment and plan that was discussed personally with the patient and wife at bedside.  > 50% of time was spent in direct patient care.   Zeb Comfort Epilepsy [facility] For questions after 5pm please refer to AMION to reach the Neurologist on call

## 2020-05-09 NOTE — Procedures (Signed)
Patient Name: [name]  MRN: [id number]  Epilepsy Attending: Lora Havens  Referring Physician/Provider: Dr. Kathrynn Speed Duration:  05/08/2020 1223 to 05/09/2020 1335  Patient history: 75 year old male with known glioma status postresection, epilepsy who presented with breakthrough seizure. EEG to evaluate for seizures.  Level of alertness: awake  AEDs during EEG study: LEV  Technical aspects: This EEG study was done with scalp electrodes positioned according to the 10-20 International system of electrode placement. Electrical activity was acquired at a sampling rate of 500Hz  and reviewed with a high frequency filter of 70Hz  and a low frequency filter of 1Hz . EEG data were recorded continuously and digitally stored.   Description: No clear posterior dominant rhythm was seen. EEG showed continuous generalized 3 to 5 Hz theta-delta delta slowing admixed with 15 to 18 Hz beta activity.  Sharp waves were also seen in right temporoparietal region, maximal T8 >P4. Hyperventilation and photic stimulation were not performed.     ABNORMALITY -Sharp waves, right temporoparietal region -Continuous slow, generalized  IMPRESSION: This study showed evidence of epileptogenicity arising from right temporoparietal region. Additionally there is also moderate diffuse encephalopathy, nonspecific etiology. No seizures were seen throughout the recording.   Shaily Librizzi Barbra Sarks

## 2020-05-09 NOTE — Progress Notes (Signed)
Pt has been admitted to the unit via bed. Wife is at bedside. Belongings placed in closet. All equipment sent intact. Call light and telephone is reach

## 2020-05-09 NOTE — Progress Notes (Signed)
EEG maintenance complete. No skin breakodown at FP1 Fp2, A2 C4 T6. Continue to monitor

## 2020-05-09 NOTE — Progress Notes (Signed)
Discontinued LTM; no skin breakdown was seen. 

## 2020-05-09 NOTE — Progress Notes (Signed)
Report given to [facility]. Pt transferred by [facility] RNs.

## 2020-05-09 NOTE — Progress Notes (Signed)
Rehab Admissions Coordinator Note:  Patient was screened by Cleatrice Burke for appropriateness for an Inpatient Acute Rehab Consult per therapy recommendation. At this time, patient not yet at a level to be considered for rehab consult. I will follow his progress with therapies.Cleatrice Burke RN MSN 05/09/2020, 12:23 PM  I can be reached at [phone].

## 2020-05-09 NOTE — Progress Notes (Signed)
   05/09/20 2144  Provider Notification  Provider Name/Title Dr Gwen Pounds  Date Provider Notified 05/09/20  Time Provider Notified 2144  Notification Type Page  Notification Reason Other (Comment) (CCMD called ST Depression on telemetry at 2049)  Response See new orders  Date of Provider Response 05/09/20  Time of Provider Response 2158

## 2020-05-09 NOTE — Evaluation (Signed)
Physical Therapy Evaluation Patient Details Name: [name] MRN: [id number] DOB: 08-Mar-1946 Today's Date: 05/09/2020   History of Present Illness  75 year old male with glioblastoma s/p resection 06/2019. Pt presents to ED on 1/17 with seizures, wife reports noting seizure activity involving pt fall out of bed but did not hit head. After EMS arrived patient had 2 additional seizures. On arrival to ED remained unresponsive with GCS 3 requiring intubation. ETT 1/17-1/19. CT head with no acute, noted residual masslike density in the right occipital lobe with similar appearing mass effect with 4-5 mm midline shift. EEG reveals epileptogenicity R temporoparietal region and moderate diffuse encephalopathy. PMH includes HTN, tachycardia.  Clinical Impression   Pt presents with generalized weakness, chronic peripheral vision field cuts L>R, impaired cognition vs baseline, difficulty performing mobility tasks, and decreased activity tolerance. Pt to benefit from acute PT to address deficits. Pt presently requiring max +2 assist for bed mobility and repeated stands this session, pt is very pleasant and hard-working, reports his goal is to be more independent so his wife doesn't have to help him as much at home. PT feels pt will be a great CIR candidate to maximize independence and decrease caregiver burden, per pt's wife she and son are able to physically assist pt at d/c if needed. OT consult placed. PT to progress mobility as tolerated, and will continue to follow acutely.      Follow Up Recommendations CIR;Supervision/Assistance - 24 hour    Equipment Recommendations  None recommended by PT    Recommendations for Other Services       Precautions / Restrictions Precautions Precautions: Fall Precaution Comments: peripheral field cut L>R, pt uses compensatory head turns. on EEG, seizures Restrictions Weight Bearing Restrictions: No      Mobility  Bed Mobility Overal bed mobility: Needs  Assistance Bed Mobility: Supine to Sit     Supine to sit: Max assist;+2 for physical assistance;HOB elevated     General bed mobility comments: Max +2 for trunk and LE management, scooting to EOB, and steadying.    Transfers Overall transfer level: Needs assistance Equipment used: Rolling walker (2 wheeled);Ambulation equipment used Transfers: Sit to/from Stand Sit to Stand: Max assist;+2 physical assistance;+2 safety/equipment;From elevated surface         General transfer comment: Max +2 for power up, rise, posterior facilitaiton for truncal extension to upright, and steadying. Verbal cuing for hand placement when rising, "chest up, hips in". STS x3 from EOB, 3rd time with use of bari stedy as pt only able to maintain standing x5 seconds before fatiguing/LE buckling  Ambulation/Gait             General Gait Details: nt  Financial trader Rankin (Stroke Patients Only)       Balance Overall balance assessment: Needs assistance Sitting-balance support: Feet supported;Bilateral upper extremity supported Sitting balance-Leahy Scale: Fair Sitting balance - Comments: able to sit EOB without PT assist, L lateral leaning with L head tilt naturally but responds to verbal cues to sit upright. EOB sitting x10 minutes Postural control: Left lateral lean Standing balance support: Bilateral upper extremity supported;During functional activity Standing balance-Leahy Scale: Zero Standing balance comment: max +2 to stand with very elevated bed height                             Pertinent Vitals/Pain Pain Assessment:  No/denies pain    Home Living Family/patient expects to be discharged to:: Private residence Living Arrangements: Spouse/significant other Available Help at Discharge: Family;Available 24 hours/day (wife, son helps as needed) Type of Home: House Home Access: Stairs to enter Entrance Stairs-Rails: Right Entrance  Stairs-Number of Steps: 2 Home Layout: Two level;Bed/bath upstairs Home Equipment: Cane - single point;Walker - 2 wheels;Shower seat;Grab bars - tub/shower;Toilet riser      Prior Function Level of Independence: Needs assistance   Gait / Transfers Assistance Needed: Pt reports pt uses RW for ambulation PTA, states over the past 3 weeks pt has been moving more slowly and requiring more physical assist  ADL's / Homemaking Assistance Needed: ADL assist from wife        Hand Dominance   Dominant Hand: Right    Extremity/Trunk Assessment   Upper Extremity Assessment Upper Extremity Assessment: Defer to OT evaluation;LUE deficits/detail LUE Deficits / Details: LUE weakness (weak grip strength, difficulty elevating shoulder), per wife due to glioblastoma.    Lower Extremity Assessment Lower Extremity Assessment: Generalized weakness    Cervical / Trunk Assessment Cervical / Trunk Assessment: Normal  Communication   Communication: No difficulties  Cognition Arousal/Alertness: Awake/alert Behavior During Therapy: WFL for tasks assessed/performed Overall Cognitive Status: Impaired/Different from baseline Area of Impairment: Orientation;Attention;Following commands;Safety/judgement;Awareness;Problem solving                 Orientation Level: Disoriented to;Time;Situation Current Attention Level: Sustained   Following Commands: Follows one step commands with increased time;Follows one step commands inconsistently Safety/Judgement: Decreased awareness of safety;Decreased awareness of deficits Awareness: Intellectual Problem Solving: Slow processing;Decreased initiation;Difficulty sequencing;Requires tactile cues;Requires verbal cues General Comments: Pt states it is 1978, knows he is in the hospital but does not know why. Pt has difficulty following commands, especially multistep, so benefits from short cues. Step-by-step sequencing assist needed for mobility.      General  Comments General comments (skin integrity, edema, etc.): HRmax 135 bpm during stand, SpO2 when accurate 88-91% on 4LO2    Exercises     Assessment/Plan    PT Assessment Patient needs continued PT services  PT Problem List Decreased strength;Decreased mobility;Decreased safety awareness;Decreased activity tolerance;Decreased balance;Decreased knowledge of use of DME;Decreased cognition;Cardiopulmonary status limiting activity;Decreased knowledge of precautions       PT Treatment Interventions DME instruction;Therapeutic activities;Gait training;Therapeutic exercise;Patient/family education;Balance training;Stair training;Functional mobility training;Neuromuscular re-education    PT Goals (Current goals can be found in the Care Plan section)  Acute Rehab PT Goals Patient Stated Goal: be more independent so I don't burden my wife PT Goal Formulation: With patient Time For Goal Achievement: 05/23/20 Potential to Achieve Goals: Good    Frequency Min 4X/week   Barriers to discharge        Co-evaluation               AM-PAC PT "6 Clicks" Mobility  Outcome Measure Help needed turning from your back to your side while in a flat bed without using bedrails?: A Lot Help needed moving from lying on your back to sitting on the side of a flat bed without using bedrails?: A Lot Help needed moving to and from a bed to a chair (including a wheelchair)?: A Lot Help needed standing up from a chair using your arms (e.g., wheelchair or bedside chair)?: A Lot Help needed to walk in hospital room?: Total Help needed climbing 3-5 steps with a railing? : Total 6 Click Score: 10    End of Session   Activity  Tolerance: Patient tolerated treatment well Patient left: in chair;with chair alarm set;with call bell/phone within reach;with nursing/sitter in room;with family/visitor present;Other (comment) (lift pad placed underneath pt) Nurse Communication: Mobility status;Need for lift equipment (lift  back to bed) PT Visit Diagnosis: Muscle weakness (generalized) (M62.81);Difficulty in walking, not elsewhere classified (R26.2)    Time: 5631-4970 PT Time Calculation (min) (ACUTE ONLY): 34 min   Charges:   PT Evaluation $PT Eval Low Complexity: 1 Low PT Treatments $Therapeutic Activity: 8-22 mins        Lexxie Winberg S, PT Acute Rehabilitation Services Pager [phone]  Office [phone]   Soso E Ruffin Pyo 05/09/2020, 11:18 AM

## 2020-05-10 ENCOUNTER — Telehealth: Payer: Self-pay

## 2020-05-10 DIAGNOSIS — G40901 Epilepsy, unspecified, not intractable, with status epilepticus: Principal | ICD-10-CM

## 2020-05-10 LAB — GLUCOSE, CAPILLARY
Glucose-Capillary: 184 mg/dL — ABNORMAL HIGH (ref 70–99)
Glucose-Capillary: 191 mg/dL — ABNORMAL HIGH (ref 70–99)
Glucose-Capillary: 200 mg/dL — ABNORMAL HIGH (ref 70–99)
Glucose-Capillary: 215 mg/dL — ABNORMAL HIGH (ref 70–99)
Glucose-Capillary: 241 mg/dL — ABNORMAL HIGH (ref 70–99)

## 2020-05-10 NOTE — Evaluation (Signed)
Occupational Therapy Evaluation Patient Details Name: [name] MRN: [id number] DOB: Oct 15, 1945 Today's Date: 05/10/2020    History of Present Illness 75 y.o. male presenting with recurrent seizures in setting of glioblastoma now post-ictal. S/p ETT 1/17-1/19. Wife also reporting cognitive and physical decline over last several weeks. Patient recently admitted 12/21 with recurrent seizures. PMHx significant for glioblastoma s/p resection 3/21, Covid(+) 12/29, DM II, HTN.   Clinical Impression   PTA patient was living in a private residence with his wife and was requiring increased assistance with functional transfers using RW and ADLs. Wife reports patient with gradual decline in cognition and increased generalized debility over last several weeks and she is unable to care for him safely at home as he is now. Patient currently with deficits in cognition, strength with weakness LUE>RUE at baseline, decreased balance, and +2 assist with use of ambulation equipment for functional transfers. Patient is very motivated and would benefit from intensive CIR placement. OT will continue to follow acutely.     Follow Up Recommendations  CIR    Equipment Recommendations  None recommended by OT (Patient has necessary DME.)    Recommendations for Other Services       Precautions / Restrictions Precautions Precautions: Fall Precaution Comments: Peripheral visual field cut L>R, seizures Restrictions Weight Bearing Restrictions: No      Mobility Bed Mobility Overal bed mobility: Needs Assistance Bed Mobility: Supine to Sit     Supine to sit: Max assist;+2 for physical assistance;HOB elevated     General bed mobility comments: Max +2 for trunk and LE management, scooting to EOB, and steadying.    Transfers Overall transfer level: Needs assistance Equipment used: Ambulation equipment used Transfers: Sit to/from Stand Sit to Stand: Max assist;+2 physical assistance;+2  safety/equipment;From elevated surface         General transfer comment: Max A with maximal multimodal cues for hand placement and upright posture. Patient with poor attention requiring frequent redirection.    Balance Overall balance assessment: Needs assistance Sitting-balance support: Feet supported;Bilateral upper extremity supported Sitting balance-Leahy Scale: Fair Sitting balance - Comments: Initially Mod A with posteriolateral LOB progressing to Min A to Min guard for static sitting at EOB. Reliant on UE support to maintain position. Postural control: Left lateral lean Standing balance support: Bilateral upper extremity supported;During functional activity Standing balance-Leahy Scale: Zero Standing balance comment: Max A +2 and use of Stedy.                           ADL either performed or assessed with clinical judgement   ADL Overall ADL's : Needs assistance/impaired     Grooming: Minimal assistance;Sitting               Lower Body Dressing: Total assistance;Bed level Lower Body Dressing Details (indicate cue type and reason): Total A to don footwear seated EOB. Toilet Transfer: Total assistance;+2 for physical assistance;+2 for safety/equipment Toilet Transfer Details (indicate cue type and reason): Simulated with transfer to recliner and Stedy with Max A +2. Patient able to stand for <10 sec at a time.                 Vision Baseline Vision/History: Wears glasses Wears Glasses: At all times Vision Assessment?: Vision impaired- to be further tested in functional context     Perception     Praxis      Pertinent Vitals/Pain Pain Assessment: Faces Faces Pain Scale: Hurts little more Pain  Location: Patient unable to localize. Pain Intervention(s): Monitored during session;Repositioned     Hand Dominance Right   Extremity/Trunk Assessment Upper Extremity Assessment Upper Extremity Assessment: LUE deficits/detail LUE Deficits /  Details: Weakness throughout. Patient undershoots. Per chart review weakness 2/2 glioblastoma. LUE Sensation: WNL LUE Coordination: decreased fine motor;decreased gross motor   Lower Extremity Assessment Lower Extremity Assessment: Defer to PT evaluation   Cervical / Trunk Assessment Cervical / Trunk Assessment: Normal   Communication Communication Communication: No difficulties   Cognition Arousal/Alertness: Awake/alert Behavior During Therapy: WFL for tasks assessed/performed Overall Cognitive Status: Impaired/Different from baseline Area of Impairment: Orientation;Attention;Following commands;Safety/judgement;Awareness;Problem solving                 Orientation Level: Disoriented to;Time;Situation;Place Current Attention Level: Sustained Memory: Decreased short-term memory Following Commands: Follows one step commands with increased time;Follows one step commands inconsistently Safety/Judgement: Decreased awareness of safety;Decreased awareness of deficits Awareness: Intellectual Problem Solving: Slow processing;Decreased initiation;Difficulty sequencing;Requires tactile cues;Requires verbal cues General Comments: Patient only oriented to person. States that he is at home. Patient mistaking this therapist for his wife. Patient requires maximal multimodal and step-by-step instructions to sequence tasks assessed. Very poor attention to task requiring frequent redirection.   General Comments  VSS on RA    Exercises     Shoulder Instructions      Home Living Family/patient expects to be discharged to:: Private residence Living Arrangements: Spouse/significant other Available Help at Discharge: Family;Available 24 hours/day (wife, son helps as needed) Type of Home: House Home Access: Stairs to enter [employer] of Steps: 2 Entrance Stairs-Rails: Right Home Layout: Two level;Bed/bath upstairs Alternate Level Stairs-Number of Steps: 13 vs stair  lift Alternate Level Stairs-Rails: Right;Left;Can reach both Bathroom Shower/Tub: Walk-in shower;Curtain   Bathroom Toilet: Handicapped height     Home Equipment: [place] - single point;Walker - 2 wheels;Shower seat;Grab bars - tub/shower;Toilet riser          Prior Functioning/Environment Level of Independence: Needs assistance  Gait / Transfers Assistance Needed: RW at baseline for mobility in home and community dwellings. Recent decline in function requiring increased external assist from family. ADL's / Homemaking Assistance Needed: Wife assist with ADLs at baseline. More assist required recently 2/2 steady decline in function.            OT Problem List: Decreased strength;Decreased activity tolerance;Impaired balance (sitting and/or standing);Decreased cognition;Decreased safety awareness;Decreased knowledge of use of DME or AE;Decreased knowledge of precautions      OT Treatment/Interventions: Self-care/ADL training;Therapeutic exercise;Energy conservation;DME and/or AE instruction;Therapeutic activities;Cognitive remediation/compensation;Balance training    OT Goals(Current goals can be found in the care plan section) Acute Rehab OT Goals Patient Stated Goal: be more independent so I don't burden my wife OT Goal Formulation: With patient Time For Goal Achievement: 05/24/20 Potential to Achieve Goals: Good ADL Goals Pt Will Perform Grooming: sitting;with supervision Pt Will Perform Upper Body Dressing: with supervision;sitting Pt Will Perform Lower Body Dressing: with min assist;sitting/lateral leans;with adaptive equipment Pt Will Transfer to Toilet: with mod assist;bedside commode;stand pivot transfer Pt Will Perform Toileting - Clothing Manipulation and hygiene: with mod assist;sitting/lateral leans;sit to/from stand Additional ADL Goal #1: Patient will attend to grooming tasks >3-5 min without cueing or redirection demonstrating improved attention to task.  OT  Frequency: Min 2X/week   Barriers to D/C: Decreased caregiver support  Per med chart, wife only able to provide min physical assist.       Co-evaluation  AM-PAC OT "6 Clicks" Daily Activity     Outcome Measure Help from another person eating meals?: None Help from another person taking care of personal grooming?: A Little Help from another person toileting, which includes using toliet, bedpan, or urinal?: Total Help from another person bathing (including washing, rinsing, drying)?: Total Help from another person to put on and taking off regular upper body clothing?: A Lot Help from another person to put on and taking off regular lower body clothing?: Total 6 Click Score: 12   End of Session Equipment Utilized During Treatment: Gait belt Nurse Communication: Mobility status;Need for lift equipment [name])  Activity Tolerance: Patient tolerated treatment well Patient left: in chair;with call bell/phone within reach;with chair alarm set  OT Visit Diagnosis: Unsteadiness on feet (R26.81)                Time: 7001-7494 OT Time Calculation (min): 28 min Charges:  OT General Charges $OT Visit: 1 Visit OT Evaluation $OT Eval Moderate Complexity: 1 Mod OT Treatments $Self Care/Home Management : 8-22 mins  Devean Skoczylas H. OTR/L Supplemental OT, Department of rehab services [phone]  Jovann Luse R H. 05/10/2020, 11:18 AM

## 2020-05-10 NOTE — Progress Notes (Signed)
Inpatient Rehabilitation Admissions Coordinator  Noted tolerance with PT and OT today. I will place order for rehab consult per protocol.  Danne Baxter, RN, MSN Rehab Admissions Coordinator [phone] 05/10/2020 1:29 PM

## 2020-05-10 NOTE — Telephone Encounter (Signed)
Patient's wife called stating she was concerned with the care he is receiving as an inpatient and wants to talk with [name] about medication adjustments his physician's are proposing. Wife also states patient had imaging done while he was in the hospital and wants to know if there was anything that was of concern on the scan.  [name] made aware and will call patient to discuss concerns.   Patient's wife also stated she had concerns about patient not being able to make it to his scheduled appointment on 05/14/20 due to him being currently admitted.  Informed patient's wife that we can reschedule appointments if patient is still admitted on 05/14/20.

## 2020-05-10 NOTE — Progress Notes (Signed)
Physical Therapy Treatment Patient Details Name: [name] MRN: [id number] DOB: September 29, 1945 Today's Date: 05/10/2020    History of Present Illness 75 y.o. male presenting with recurrent seizures in setting of glioblastoma now post-ictal. S/p ETT 1/17-1/19. Wife also reporting cognitive and physical decline over last several weeks. Patient recently admitted 12/21 with recurrent seizures. PMHx significant for glioblastoma s/p resection 3/21, Covid(+) 12/29, DM II, HTN.    PT Comments    Pt tolerates treatment well but has very poor memory and awareness of deficits. Pt quickly forgets he was leaning strongly to left upon PT entry after initially acknowledging his poor balance upon PT entry. Pt continues to require significant physical assistance to transfer due to LE weakness. Pt remains at a high falls risk and would likely be unable to safely mobilize in the home with assistance of his spouse at this time. Pt recommends discharge to CIR to improve functional mobility and to reduce caregiver burden.  Follow Up Recommendations  CIR;Supervision/Assistance - 24 hour     Equipment Recommendations  None recommended by PT    Recommendations for Other Services       Precautions / Restrictions Precautions Precautions: Fall Precaution Comments: Peripheral visual field cut L>R, seizures Restrictions Weight Bearing Restrictions: No    Mobility  Bed Mobility Overal bed mobility: Needs Assistance Bed Mobility: Sit to Supine     Supine to sit: Mod assist     General bed mobility comments: pt requires assist to elevate LEs onto bed, verbal cues for technique and hand placement  Transfers Overall transfer level: Needs assistance Equipment used: Rolling walker (2 wheeled);1 person hand held assist Transfers: Sit to/from [employer] Sit to Stand: Max assist   Squat pivot transfers: Max assist     General transfer comment: pt requires PT cues for initiation as well  as bilateral UE support or use of walker. PT provides knee block for suqat pivot transfer with use of drop arm recliner  Ambulation/Gait                 Stairs             Wheelchair Mobility    Modified Rankin (Stroke Patients Only)       Balance Overall balance assessment: Needs assistance Sitting-balance support: Single extremity supported;Bilateral upper extremity supported;Feet supported Sitting balance-Leahy Scale: Poor Sitting balance - Comments: tendency for L lateral lean, minG-minA Postural control: Left lateral lean Standing balance support: Bilateral upper extremity supported Standing balance-Leahy Scale: Poor Standing balance comment: maxA to maintain static standing with BUE support                            Cognition Arousal/Alertness: Awake/alert Behavior During Therapy: WFL for tasks assessed/performed Overall Cognitive Status: Impaired/Different from baseline Area of Impairment: Memory;Following commands;Safety/judgement;Awareness;Problem solving                 Orientation Level: Disoriented to;Time;Situation;Place Current Attention Level: Sustained Memory: Decreased recall of precautions;Decreased short-term memory Following Commands: Follows one step commands with increased time Safety/Judgement: Decreased awareness of safety;Decreased awareness of deficits Awareness: Intellectual Problem Solving: Slow processing;Decreased initiation;Difficulty sequencing;Requires tactile cues;Requires verbal cues General Comments: Patient only oriented to person. States that he is at home. Patient mistaking this therapist for his wife. Patient requires maximal multimodal and step-by-step instructions to sequence tasks assessed. Very poor attention to task requiring frequent redirection.      Exercises  General Comments General comments (skin integrity, edema, etc.): VSS on RA      Pertinent Vitals/Pain Pain Assessment:  No/denies pain Faces Pain Scale: Hurts little more Pain Location: Patient unable to localize. Pain Intervention(s): Monitored during session;Repositioned    Home Living Family/patient expects to be discharged to:: Private residence Living Arrangements: Spouse/significant other Available Help at Discharge: Family;Available 24 hours/day (wife, son helps as needed) Type of Home: House Home Access: Stairs to enter Entrance Stairs-Rails: Right Home Layout: Two level;Bed/bath upstairs Home Equipment: Cane - single point;Walker - 2 wheels;Shower seat;Grab bars - tub/shower;Toilet riser      Prior Function Level of Independence: Needs assistance  Gait / Transfers Assistance Needed: RW at baseline for mobility in home and community dwellings. Recent decline in function requiring increased external assist from family. ADL's / Homemaking Assistance Needed: Wife assist with ADLs at baseline. More assist required recently 2/2 steady decline in function.     PT Goals (current goals can now be found in the care plan section) Acute Rehab PT Goals Patient Stated Goal: be more independent so I don't burden my wife Progress towards PT goals: Progressing toward goals    Frequency    Min 4X/week      PT Plan Current plan remains appropriate    Co-evaluation              AM-PAC PT "6 Clicks" Mobility   Outcome Measure  Help needed turning from your back to your side while in a flat bed without using bedrails?: A Lot Help needed moving from lying on your back to sitting on the side of a flat bed without using bedrails?: A Lot Help needed moving to and from a bed to a chair (including a wheelchair)?: A Lot Help needed standing up from a chair using your arms (e.g., wheelchair or bedside chair)?: A Lot Help needed to walk in hospital room?: Total Help needed climbing 3-5 steps with a railing? : Total 6 Click Score: 10    End of Session Equipment Utilized During Treatment: Gait  belt Activity Tolerance: Patient tolerated treatment well Patient left: in bed;with call bell/phone within reach;with bed alarm set Nurse Communication: Mobility status;Need for lift equipment PT Visit Diagnosis: Muscle weakness (generalized) (M62.81);Difficulty in walking, not elsewhere classified (R26.2)     Time: 1006-1030 PT Time Calculation (min) (ACUTE ONLY): 24 min  Charges:  $Therapeutic Activity: 23-37 mins                     Zenaida Niece, PT, DPT Acute Rehabilitation Pager: [phone]    Zenaida Niece 05/10/2020, 11:43 AM

## 2020-05-10 NOTE — Progress Notes (Signed)
PROGRESS NOTE    [name]  BJY:[id number] DOB: April 27, 1945 DOA: 05/06/2020 PCP: Maurice Small, MD   Brief Narrative:  75 year old male with glioblastoma s/p resection 06/2019. 1/17 Presents to ED with reported seizures, wife reports noting seizure activity patient then fell out of bed but did not hit head. After EMS arrived patient had 2 additional seizures. On arrival to ED remained unresponsive with GCS 3 requiring intubation. CT head with no acute, noted residual masslike density in the right occipital lobe with similar appearing mass effect with 4-5 mm midline shift. Given 1 gram of keppra and 4 mg decadron. Critical Care Consulted for admission.    Recent admission 03/2020 with recurrent seizures. Keppra increased to 1500 BID at this time. Reportedly over last year has declined physically as well with cognition and memory, needing increased amount of help with ADLS. Failed Chemo, last Nov 2021, Salvage Therapy planned to start. On 12/29 patient tested positive for COVID. On 12/31 received casirivimab\imdevimab infusion per oncology.   Significant events: - Covid + 12/31 outpatient testing - Intubated 1/17 in ED due to severe mental status depression/airway compromise in the setting of recurrent seizures - CT Head 1/17 > 1. Grossly similar appearance of the brain since head CT 03/16/2020:  Residual masslike density in the right occipital lobe, likely involving splenium of corpus callosum. Similar surrounding edema and regional mass effect with about 4-5 mm midline shift to the left. 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 - Neurology following - continue current regimen: decadron, keppra 2g BID, pyridoxine $RemoveBeforeD'100mg'JVJEveEmmlrWvZ$  daily.    Acute hypoxic respiratory failure due to status epilepticus -Resolved, extubated 05/08/2020 -Continue to follow, wean oxygen as tolerated  COVID + on 12/29 -resolved Patient no longer needs quarantine per CDC guidelines -No current therapy ongoing, completed steroid course -Unable to administer Remdesivir for Actemra/baricitinib -currently not indicated -Isolation discontinued 1/19.   H/O HTN H/O Sinus Tachycardia  -Resume metoprolol and Norvasc once extubated  Hyperglycemia in the setting of DM (unclear if new diagnosis) - Previous A1C 8.2 - meets critieria for DM - Not on a home regimen  -Continue sliding scale insulin, hypoglycemic protocol  Glioblastoma with isocitrate dehydrogenase gene wildtype - followed by [name], failed chemotherapy, s/p resection 06/2019, recently offered salvage therapy vs hospice. Family wanting to continue treatments at this time.  -Continue scheduled decadron, keppra as above.  DVT prophylaxis: Heparin Code Status: Full Family Communication: None available  Status is: Inpatient  Dispo: The patient is from: Home              Anticipated d/c is to: To be determined              Anticipated d/c date is: 48 to 72 hours              Patient currently not medically stable for discharge  Consultants:   PCCM, neurology  Procedures:   None  Antimicrobials:  None  Subjective: No acute issues or events overnight denies nausea vomiting diarrhea constipation headache fevers or chills  Objective: Vitals:   05/09/20 1825 05/09/20 2032 05/09/20 2349 05/10/20  0352  BP: 124/74 110/64 123/87 107/71  Pulse: 61 (!) 106 60 89  Resp:  (!) 24 (!) 22 (!) 21  Temp:  98 F (36.7 C) 98.9 F (37.2 C) 98.5 F (36.9 C)  TempSrc:  Oral Oral Oral  SpO2:  92% 94%   Weight:       Height:        Intake/Output Summary (Last 24 hours) at 05/10/2020 0753 Last data filed at 05/09/2020 2230 Gross per 24 hour  Intake 1344.77 ml  Output 200 ml  Net 1144.77 ml   Filed Weights   05/06/20 1400 05/07/20 0124  Weight: 93 kg 86.1 kg    Examination:  General exam: Appears calm and comfortable  Respiratory system: Clear to auscultation. Respiratory effort normal. Cardiovascular system: S1 & S2 heard, RRR. No JVD, murmurs, rubs, gallops or clicks. No pedal edema. Gastrointestinal system: Abdomen is nondistended, soft and nontender. No organomegaly or masses felt. Normal bowel sounds heard. Central nervous system: Alert and oriented. No focal neurological deficits. Extremities: Symmetric 5 x 5 power. Skin: No rashes, lesions or ulcers  Data Reviewed: I have personally reviewed following labs and imaging studies  CBC: Recent Labs  Lab 05/06/20 1353 05/06/20 1621 05/07/20 0320  WBC 5.8  --  5.5  NEUTROABS 4.3  --   --   HGB 13.9 12.2* 12.8*  HCT 43.3 36.0* 36.3*  MCV 92.1  --  87.7  PLT 155  --  948*   Basic Metabolic Panel: Recent Labs  Lab 05/06/20 1353 05/06/20 1621 05/06/20 1924 05/07/20 0320 05/08/20 0401  NA 136 135  --  138 136  K 4.4 3.9  --  3.8 3.7  CL 100  --   --  103 103  CO2 20*  --   --  23 23  GLUCOSE 377*  --   --  217* 117*  BUN 24*  --   --  20 19  CREATININE 0.89  --   --  0.66 0.59*  CALCIUM 9.1  --   --  8.6* 8.5*  MG  --   --  1.6* 1.6*  --   PHOS  --   --  3.8 3.5  --    GFR: Estimated Creatinine Clearance: 86.3 mL/min (A) (by C-G formula based on SCr of 0.59 mg/dL (L)). Liver Function Tests: Recent Labs  Lab 05/06/20 1353 05/07/20 0320  AST 31 20  ALT 29 23  ALKPHOS 85 67  BILITOT 1.4* 1.1  PROT 6.3* 5.1*  ALBUMIN 3.5 2.9*   No results for input(s): LIPASE, AMYLASE in the last 168 hours. No results for input(s): AMMONIA in the last 168 hours. Coagulation Profile: No results for input(s): INR, PROTIME in the last  168 hours. Cardiac Enzymes: No results for input(s): CKTOTAL, CKMB, CKMBINDEX, TROPONINI in the last 168 hours. BNP (last 3 results) No results for input(s): PROBNP in the last 8760 hours. HbA1C: No results for input(s): HGBA1C in the last 72 hours. CBG: Recent Labs  Lab 05/09/20 1154 05/09/20 1601 05/09/20 1759 05/10/20 0002 05/10/20 0712  GLUCAP 84 162* 169* 241* 200*   Lipid Profile: No results for input(s): CHOL, HDL, LDLCALC, TRIG, CHOLHDL, LDLDIRECT in the last 72 hours. Thyroid Function Tests: No results for input(s): TSH, T4TOTAL, FREET4, T3FREE, THYROIDAB in the last 72 hours. Anemia Panel: No results for input(s): VITAMINB12, FOLATE, FERRITIN, TIBC, IRON, RETICCTPCT in the last 72 hours. Sepsis Labs: Recent Labs  Lab 05/06/20 1923 05/06/20 1924 05/07/20 0320 05/08/20  0401  PROCALCITON  --  0.58 1.21 0.54  LATICACIDVEN 2.3*  --  2.9*  --     Recent Results (from the past 240 hour(s))  Resp Panel by RT-PCR (Flu A&B, Covid) Nasopharyngeal Swab     Status: Abnormal   Collection Time: 05/06/20  2:07 PM   Specimen: Nasopharyngeal Swab; Nasopharyngeal(NP) swabs in vial transport medium  Result Value Ref Range Status   SARS Coronavirus 2 by RT PCR POSITIVE (A) NEGATIVE Final    Comment: emailed L. Berdik RN 16:10 05/06/20 ([email or web address]) (NOTE) SARS-CoV-2 target nucleic acids are DETECTED.  The SARS-CoV-2 RNA is generally detectable in upper respiratory specimens during the acute phase of infection. Positive results are indicative of the presence of the identified virus, but do not rule out bacterial infection or co-infection with other pathogens not detected by the test. Clinical correlation with patient history and other diagnostic information is necessary to determine patient infection status. The expected result is Negative.  Fact Sheet for Patients: EntrepreneurPulse.com.au  Fact Sheet for Healthcare  Providers: IncredibleEmployment.be  This test is not yet approved or cleared by the [place] FDA and  has been authorized for detection and/or diagnosis of SARS-CoV-2 by FDA under an Emergency Use Authorization (EUA).  This EUA will remain in effect (meaning this test can be used) for the duration of  the COVID-1 9 declaration under Section 564(b)(1) of the Act, 21 U.S.C. section 360bbb-3(b)(1), unless the authorization is terminated or revoked sooner.     Influenza A by PCR NEGATIVE NEGATIVE Final   Influenza B by PCR NEGATIVE NEGATIVE Final    Comment: (NOTE) The Xpert Xpress SARS-CoV-2/FLU/RSV plus assay is intended as an aid in the diagnosis of influenza from Nasopharyngeal swab specimens and should not be used as a sole basis for treatment. Nasal washings and aspirates are unacceptable for Xpert Xpress SARS-CoV-2/FLU/RSV testing.  Fact Sheet for Patients: EntrepreneurPulse.com.au  Fact Sheet for Healthcare Providers: IncredibleEmployment.be  This test is not yet approved or cleared by the [place] FDA and has been authorized for detection and/or diagnosis of SARS-CoV-2 by FDA under an Emergency Use Authorization (EUA). This EUA will remain in effect (meaning this test can be used) for the duration of the COVID-19 declaration under Section 564(b)(1) of the Act, 21 U.S.C. section 360bbb-3(b)(1), unless the authorization is terminated or revoked.  Performed at [facility], [place] [address], [place], [place] [address]   Culture, blood (routine x 2)     Status: None (Preliminary result)   Collection Time: 05/06/20  4:25 PM   Specimen: BLOOD LEFT HAND  Result Value Ref Range Status   Specimen Description BLOOD LEFT HAND  Final   Special Requests   Final    BOTTLES DRAWN AEROBIC ONLY Blood Culture results may not be optimal due to an inadequate volume of blood received in culture bottles   Culture   Final     NO GROWTH 3 DAYS Performed at [facility], [place] [address] [place], [place] [address]    Report Status PENDING  Incomplete  Culture, blood (routine x 2)     Status: None (Preliminary result)   Collection Time: 05/06/20  9:10 PM   Specimen: BLOOD RIGHT HAND  Result Value Ref Range Status   Specimen Description BLOOD RIGHT HAND  Final   Special Requests   Final    BOTTLES DRAWN AEROBIC AND ANAEROBIC Blood Culture adequate volume   Culture   Final    NO GROWTH 3  DAYS Performed at [facility], [place] [address], [place], [place] [address]    Report Status PENDING  Incomplete  MRSA PCR Screening     Status: None   Collection Time: 05/07/20  1:25 AM   Specimen: Nasopharyngeal  Result Value Ref Range Status   MRSA by PCR NEGATIVE NEGATIVE Final    Comment:        The GeneXpert MRSA Assay (FDA approved for NASAL specimens only), is one component of a comprehensive MRSA colonization surveillance program. It is not intended to diagnose MRSA infection nor to guide or monitor treatment for MRSA infections. Performed at [facility], [place] [address] [place], [place] [address]          Radiology Studies: Overnight EEG with video  Result Date: 05/09/2020 Lora Havens, MD     05/09/2020  2:14 PM Patient Name: [name] MRN: [id number] Epilepsy Attending: Lora Havens Referring Physician/Provider: Dr. Kathrynn Speed Duration:  05/08/2020 1223 to 05/09/2020 1335  Patient history: 75 year old male with known glioma status postresection, epilepsy who presented with breakthrough seizure. EEG to evaluate for seizures.  Level of alertness: awake  AEDs during EEG study: LEV  Technical aspects: This EEG study was done with scalp electrodes positioned according to the 10-20 International system of electrode placement. Electrical activity was acquired at a sampling rate of $Remov'500Hz'susYLS$  and reviewed with a high frequency filter of $RemoveB'70Hz'mruzlbSt$  and a low frequency filter of  $R'1Hz'qx$ . EEG data were recorded continuously and digitally stored.  Description: No clear posterior dominant rhythm was seen. EEG showed continuous generalized 3 to 5 Hz theta-delta delta slowing admixed with 15 to 18 Hz beta activity.  Sharp waves were also seen in right temporoparietal region, maximal T8 >P4. Hyperventilation and photic stimulation were not performed.    ABNORMALITY -Sharp waves, right temporoparietal region -Continuous slow, generalized  IMPRESSION: This study showed evidence of epileptogenicity arising from right temporoparietal region. Additionally there is also moderate diffuse encephalopathy, nonspecific etiology. No seizures were seen throughout the recording.   Priyanka Barbra Sarks        Scheduled Meds: . amLODipine  10 mg Oral QPM  . atorvastatin  80 mg Oral QHS  . calcium citrate  200 mg of elemental calcium Oral Daily  . chlorhexidine gluconate (MEDLINE KIT)  15 mL Mouth Rinse BID  . Chlorhexidine Gluconate Cloth  6 each Topical Daily  . dexamethasone  2 mg Oral Daily  . dexamethasone  4 mg Oral QPM  . heparin injection (subcutaneous)  5,000 Units Subcutaneous Q8H  . insulin aspart  0-9 Units Subcutaneous Q6H  . levETIRAcetam  2,000 mg Oral BID  . mouth rinse  15 mL Mouth Rinse 10 times per day  . metoprolol succinate  12.5 mg Oral QPM  . pyridOXINE  100 mg Intravenous Daily   Continuous Infusions: . lactated ringers Stopped (05/09/20 1257)     LOS: 4 days   Time spent: 15min  Terralyn Matsumura C Blondina Coderre, DO [facility]  If 7PM-7AM, please contact night-coverage [email or web address]  05/10/2020, 7:53 AM

## 2020-05-10 NOTE — Care Management Important Message (Signed)
Important Message  Patient Details  Name: [name] MRN: [id number] Date of Birth: 1946-01-15   Medicare Important Message Given:  Yes     Orbie Pyo 05/10/2020, 2:43 PM

## 2020-05-11 LAB — CULTURE, BLOOD (ROUTINE X 2)
Culture: NO GROWTH
Culture: NO GROWTH
Special Requests: ADEQUATE

## 2020-05-11 LAB — GLUCOSE, CAPILLARY
Glucose-Capillary: 192 mg/dL — ABNORMAL HIGH (ref 70–99)
Glucose-Capillary: 194 mg/dL — ABNORMAL HIGH (ref 70–99)
Glucose-Capillary: 202 mg/dL — ABNORMAL HIGH (ref 70–99)
Glucose-Capillary: 218 mg/dL — ABNORMAL HIGH (ref 70–99)

## 2020-05-11 MED ORDER — POLYETHYLENE GLYCOL 3350 17 G PO PACK: 17.0000 g | PACK | Freq: Every day | ORAL | Status: DC | PRN

## 2020-05-11 NOTE — Progress Notes (Signed)
Inpatient Rehab Admissions:  Inpatient Rehab Consult received.  I met with patient and wife at the bedside for rehabilitation assessment and to discuss goals and expectations of an inpatient rehab admission.  Pt was lethargic so spoke to wife, [name].  She acknowledged understanding of goals and expectations.  She is interested in pt pursuing CIR.  Will continue to follow.   Signed: Gayland Curry, [place], [place] Admissions Coordinator [phone]

## 2020-05-11 NOTE — Plan of Care (Signed)
  Problem: Clinical Measurements: Goal: Will remain free from infection Outcome: Progressing   Problem: Clinical Measurements: Goal: Ability to maintain clinical measurements within normal limits will improve Outcome: Progressing

## 2020-05-11 NOTE — Progress Notes (Signed)
PROGRESS NOTE    [name]  VVO:[id number] DOB: [birth year] DOA: 05/06/2020 PCP: Maurice Small, MD   Brief Narrative:  75 year old male with glioblastoma s/p resection 06/2019. 1/17 Presents to ED with reported seizures, wife reports noting seizure activity patient then fell out of bed but did not hit head. After EMS arrived patient had 2 additional seizures. On arrival to ED remained unresponsive with GCS 3 requiring intubation. CT head with no acute, noted residual masslike density in the right occipital lobe with similar appearing mass effect with 4-5 mm midline shift. Given 1 gram of keppra and 4 mg decadron. Critical Care Consulted for admission.    Recent admission 03/2020 with recurrent seizures. Keppra increased to 1500 BID at this time. Reportedly over last year has declined physically as well with cognition and memory, needing increased amount of help with ADLS. Failed Chemo, last Nov 2021, Salvage Therapy planned to start. On 12/29 patient tested positive for COVID. On 12/31 received casirivimab\imdevimab infusion per oncology.   Significant events: - Covid + 12/31 outpatient - Intubated 1/17 in ED due to severe mental status depression/airway compromise in the setting of recurrent seizures - CT Head 1/17 > 1. Grossly similar appearance of the brain since head CT 03/16/2020:  Residual masslike density in the right occipital lobe, likely involving splenium of corpus callosum. Similar surrounding edema and regional mass effect with about 4-5 mm midline shift to the left. 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 - Neurology following - continue current regimen: decadron, keppra 2g BID, pyridoxine $RemoveBeforeD'100mg'txzoVthcaesGCu$  daily.    Acute hypoxic respiratory failure due to status epilepticus -Resolved, extubated 05/08/2020 -Continue to follow, wean oxygen as tolerated  COVID + on 12/29 -resolved Patient no longer needs quarantine per CDC guidelines -No current therapy ongoing, completed steroid course -Unable to administer Remdesivir for Actemra/baricitinib -currently not indicated -Isolation discontinued 1/19.   H/O HTN H/O Sinus Tachycardia  -Resume metoprolol and Norvasc once extubated  Hyperglycemia in the setting of DM (unclear if new diagnosis) - Previous A1C 8.2 - meets critieria for DM - Not on a home regimen  -Continue sliding scale insulin, hypoglycemic protocol  Glioblastoma with isocitrate dehydrogenase gene wildtype - followed by [name], failed chemotherapy, s/p resection 06/2019, recently offered salvage therapy vs hospice. Family wanting to continue treatments at this time.  -Continue scheduled decadron, keppra as above.  DVT prophylaxis: Heparin Code Status: Full Family Communication: None available  Status is: Inpatient  Dispo: The patient is from: Home              Anticipated d/c is to: CIR              Anticipated d/c date is: 24-48 hours              Patient currently medically stable for discharge  Consultants:   PCCM, neurology  Procedures:   None  Antimicrobials:  None  Subjective: No acute issues or events overnight denies nausea vomiting diarrhea constipation headache fevers or chills  Objective: Vitals:   05/10/20 1545 05/10/20 2119 05/10/20 2328 05/11/20 0326  BP: (!) 147/88 Marland Kitchen)  161/107 113/89 (!) 144/91  Pulse: 85 (!) 110 88 86  Resp: $Remo'20 19 18 18  'exfHr$ Temp: 98.5 F (36.9 C) 98.4 F (36.9 C) 98.5 F (36.9 C) 98.8 F (37.1 C)  TempSrc: Oral Oral Oral Oral  SpO2: 90% (!)  84% (!) 85% (!) 86%  Weight:      Height:        Intake/Output Summary (Last 24 hours) at 05/11/2020 0759 Last data filed at 05/11/2020 0546 Gross per 24 hour  Intake 510 ml  Output 550 ml  Net -40 ml   Filed Weights   05/06/20 1400 05/07/20 0124  Weight: 93 kg 86.1 kg    Examination:  General exam: Appears calm and comfortable  Respiratory system: Clear to auscultation. Respiratory effort normal. Cardiovascular system: S1 & S2 heard, RRR. No JVD, murmurs, rubs, gallops or clicks. No pedal edema. Gastrointestinal system: Abdomen is nondistended, soft and nontender. No organomegaly or masses felt. Normal bowel sounds heard. Central nervous system: Alert and oriented. No focal neurological deficits. Extremities: Symmetric 5 x 5 power. Skin: No rashes, lesions or ulcers  Data Reviewed: I have personally reviewed following labs and imaging studies  CBC: Recent Labs  Lab 05/06/20 1353 05/06/20 1621 05/07/20 0320  WBC 5.8  --  5.5  NEUTROABS 4.3  --   --   HGB 13.9 12.2* 12.8*  HCT 43.3 36.0* 36.3*  MCV 92.1  --  87.7  PLT 155  --  476*   Basic Metabolic Panel: Recent Labs  Lab 05/06/20 1353 05/06/20 1621 05/06/20 1924 05/07/20 0320 05/08/20 0401  NA 136 135  --  138 136  K 4.4 3.9  --  3.8 3.7  CL 100  --   --  103 103  CO2 20*  --   --  23 23  GLUCOSE 377*  --   --  217* 117*  BUN 24*  --   --  20 19  CREATININE 0.89  --   --  0.66 0.59*  CALCIUM 9.1  --   --  8.6* 8.5*  MG  --   --  1.6* 1.6*  --   PHOS  --   --  3.8 3.5  --    GFR: Estimated Creatinine Clearance: 86.3 mL/min (A) (by C-G formula based on SCr of 0.59 mg/dL (L)). Liver Function Tests: Recent Labs  Lab 05/06/20 1353 05/07/20 0320  AST 31 20  ALT 29 23  ALKPHOS 85 67  BILITOT 1.4* 1.1  PROT 6.3* 5.1*  ALBUMIN 3.5 2.9*   No results for input(s): LIPASE, AMYLASE in the last 168 hours. No results for input(s): AMMONIA in the last 168 hours. Coagulation Profile: No results for  input(s): INR, PROTIME in the last 168 hours. Cardiac Enzymes: No results for input(s): CKTOTAL, CKMB, CKMBINDEX, TROPONINI in the last 168 hours. BNP (last 3 results) No results for input(s): PROBNP in the last 8760 hours. HbA1C: No results for input(s): HGBA1C in the last 72 hours. CBG: Recent Labs  Lab 05/10/20 0712 05/10/20 1215 05/10/20 1641 05/10/20 2322 05/11/20 0539  GLUCAP 200* 184* 191* 215* 202*   Lipid Profile: No results for input(s): CHOL, HDL, LDLCALC, TRIG, CHOLHDL, LDLDIRECT in the last 72 hours. Thyroid Function Tests: No results for input(s): TSH, T4TOTAL, FREET4, T3FREE, THYROIDAB in the last 72 hours. Anemia Panel: No results for input(s): VITAMINB12, FOLATE, FERRITIN, TIBC, IRON, RETICCTPCT in the last 72 hours. Sepsis Labs: Recent Labs  Lab 05/06/20 1923 05/06/20 1924 05/07/20 0320 05/08/20  0401  PROCALCITON  --  0.58 1.21 0.54  LATICACIDVEN 2.3*  --  2.9*  --     Recent Results (from the past 240 hour(s))  Resp Panel by RT-PCR (Flu A&B, Covid) Nasopharyngeal Swab     Status: Abnormal   Collection Time: 05/06/20  2:07 PM   Specimen: Nasopharyngeal Swab; Nasopharyngeal(NP) swabs in vial transport medium  Result Value Ref Range Status   SARS Coronavirus 2 by RT PCR POSITIVE (A) NEGATIVE Final    Comment: emailed L. Berdik RN 16:10 05/06/20 ([email or web address]) (NOTE) SARS-CoV-2 target nucleic acids are DETECTED.  The SARS-CoV-2 RNA is generally detectable in upper respiratory specimens during the acute phase of infection. Positive results are indicative of the presence of the identified virus, but do not rule out bacterial infection or co-infection with other pathogens not detected by the test. Clinical correlation with patient history and other diagnostic information is necessary to determine patient infection status. The expected result is Negative.  Fact Sheet for Patients: EntrepreneurPulse.com.au  Fact Sheet for Healthcare  Providers: IncredibleEmployment.be  This test is not yet approved or cleared by the [place] FDA and  has been authorized for detection and/or diagnosis of SARS-CoV-2 by FDA under an Emergency Use Authorization (EUA).  This EUA will remain in effect (meaning this test can be used) for the duration of  the COVID-1 9 declaration under Section 564(b)(1) of the Act, 21 U.S.C. section 360bbb-3(b)(1), unless the authorization is terminated or revoked sooner.     Influenza A by PCR NEGATIVE NEGATIVE Final   Influenza B by PCR NEGATIVE NEGATIVE Final    Comment: (NOTE) The Xpert Xpress SARS-CoV-2/FLU/RSV plus assay is intended as an aid in the diagnosis of influenza from Nasopharyngeal swab specimens and should not be used as a sole basis for treatment. Nasal washings and aspirates are unacceptable for Xpert Xpress SARS-CoV-2/FLU/RSV testing.  Fact Sheet for Patients: EntrepreneurPulse.com.au  Fact Sheet for Healthcare Providers: IncredibleEmployment.be  This test is not yet approved or cleared by the [place] FDA and has been authorized for detection and/or diagnosis of SARS-CoV-2 by FDA under an Emergency Use Authorization (EUA). This EUA will remain in effect (meaning this test can be used) for the duration of the COVID-19 declaration under Section 564(b)(1) of the Act, 21 U.S.C. section 360bbb-3(b)(1), unless the authorization is terminated or revoked.  Performed at [facility], [place] [address], [place], [place] [address]   Culture, blood (routine x 2)     Status: None (Preliminary result)   Collection Time: 05/06/20  4:25 PM   Specimen: BLOOD LEFT HAND  Result Value Ref Range Status   Specimen Description BLOOD LEFT HAND  Final   Special Requests   Final    BOTTLES DRAWN AEROBIC ONLY Blood Culture results may not be optimal due to an inadequate volume of blood received in culture bottles   Culture   Final     NO GROWTH 4 DAYS Performed at [facility], [place] [address] [place], [place] [address]    Report Status PENDING  Incomplete  Culture, blood (routine x 2)     Status: None (Preliminary result)   Collection Time: 05/06/20  9:10 PM   Specimen: BLOOD RIGHT HAND  Result Value Ref Range Status   Specimen Description BLOOD RIGHT HAND  Final   Special Requests   Final    BOTTLES DRAWN AEROBIC AND ANAEROBIC Blood Culture adequate volume   Culture   Final    NO GROWTH 4  DAYS Performed at [facility], [place] [address] [place], [place] [address]    Report Status PENDING  Incomplete  MRSA PCR Screening     Status: None   Collection Time: 05/07/20  1:25 AM   Specimen: Nasopharyngeal  Result Value Ref Range Status   MRSA by PCR NEGATIVE NEGATIVE Final    Comment:        The GeneXpert MRSA Assay (FDA approved for NASAL specimens only), is one component of a comprehensive MRSA colonization surveillance program. It is not intended to diagnose MRSA infection nor to guide or monitor treatment for MRSA infections. Performed at [facility], [place] [address], [place], [place] [address]          Radiology Studies: Overnight EEG with video  Result Date: 05/09/2020 Lora Havens, MD     05/09/2020  2:14 PM Patient Name: TYRESE CAPRIOTTI MRN: [id number] Epilepsy Attending: Lora Havens Referring Physician/Provider: Dr. Kathrynn Speed Duration:  05/08/2020 1223 to 05/09/2020 1335  Patient history: 75 year old male with known glioma status postresection, epilepsy who presented with breakthrough seizure. EEG to evaluate for seizures.  Level of alertness: awake  AEDs during EEG study: LEV  Technical aspects: This EEG study was done with scalp electrodes positioned according to the 10-20 International system of electrode placement. Electrical activity was acquired at a sampling rate of $Remov'500Hz'hqHSJO$  and reviewed with a high frequency filter of $RemoveB'70Hz'DPkztOoY$  and a low frequency filter of  $R'1Hz'Po$ . EEG data were recorded continuously and digitally stored.  Description: No clear posterior dominant rhythm was seen. EEG showed continuous generalized 3 to 5 Hz theta-delta delta slowing admixed with 15 to 18 Hz beta activity.  Sharp waves were also seen in right temporoparietal region, maximal T8 >P4. Hyperventilation and photic stimulation were not performed.    ABNORMALITY -Sharp waves, right temporoparietal region -Continuous slow, generalized  IMPRESSION: This study showed evidence of epileptogenicity arising from right temporoparietal region. Additionally there is also moderate diffuse encephalopathy, nonspecific etiology. No seizures were seen throughout the recording.   Priyanka Barbra Sarks        Scheduled Meds: . amLODipine  10 mg Oral QPM  . atorvastatin  80 mg Oral QHS  . calcium citrate  200 mg of elemental calcium Oral Daily  . chlorhexidine gluconate (MEDLINE KIT)  15 mL Mouth Rinse BID  . Chlorhexidine Gluconate Cloth  6 each Topical Daily  . dexamethasone  2 mg Oral Daily  . dexamethasone  4 mg Oral QPM  . heparin injection (subcutaneous)  5,000 Units Subcutaneous Q8H  . insulin aspart  0-9 Units Subcutaneous Q6H  . levETIRAcetam  2,000 mg Oral BID  . mouth rinse  15 mL Mouth Rinse 10 times per day  . metoprolol succinate  12.5 mg Oral QPM  . pyridOXINE  100 mg Intravenous Daily   Continuous Infusions: . lactated ringers Stopped (05/09/20 1257)     LOS: 5 days   Time spent: 45min  Cyana Shook C Mamoru Takeshita, DO [facility]  If 7PM-7AM, please contact night-coverage [email or web address]  05/11/2020, 7:59 AM

## 2020-05-12 LAB — GLUCOSE, CAPILLARY
Glucose-Capillary: 188 mg/dL — ABNORMAL HIGH (ref 70–99)
Glucose-Capillary: 131 mg/dL — ABNORMAL HIGH (ref 70–99)
Glucose-Capillary: 182 mg/dL — ABNORMAL HIGH (ref 70–99)

## 2020-05-12 LAB — BASIC METABOLIC PANEL
Anion gap: 9 (ref 5–15)
BUN: 15 mg/dL (ref 8–23)
CO2: 26 mmol/L (ref 22–32)
Calcium: 8.6 mg/dL — ABNORMAL LOW (ref 8.9–10.3)
Chloride: 101 mmol/L (ref 98–111)
Creatinine, Ser: 0.49 mg/dL — ABNORMAL LOW (ref 0.61–1.24)
GFR, Estimated: >60 mL/min (ref >60)
Glucose, Bld: 205 mg/dL — ABNORMAL HIGH (ref 70–99)
Potassium: 3.9 mmol/L (ref 3.5–5.1)
Sodium: 136 mmol/L (ref 135–145)

## 2020-05-12 LAB — CBC
HCT: 32.3 % — ABNORMAL LOW (ref 39.0–52.0)
Hemoglobin: 11.6 g/dL — ABNORMAL LOW (ref 13.0–17.0)
MCH: 31.3 pg (ref 26.0–34.0)
MCHC: 35.9 g/dL (ref 30.0–36.0)
MCV: 87.1 fL (ref 80.0–100.0)
Platelets: 119 10*3/uL — ABNORMAL LOW (ref 150–400)
RBC: 3.71 MIL/uL — ABNORMAL LOW (ref 4.22–5.81)
RDW: 14.3 % (ref 11.5–15.5)
WBC: 4.7 10*3/uL (ref 4.0–10.5)
nRBC: 0 % (ref 0.0–0.2)

## 2020-05-12 LAB — TROPONIN I (HIGH SENSITIVITY)
Troponin I (High Sensitivity): 32 ng/L — ABNORMAL HIGH (ref <18)
Troponin I (High Sensitivity): 26 ng/L — ABNORMAL HIGH (ref <18)

## 2020-05-12 NOTE — Progress Notes (Addendum)
PROGRESS NOTE    [name]  ZDG:[id number] DOB: [birth year] DOA: 05/06/2020 PCP: Maurice Small, MD   Brief Narrative:   75 year old male with glioblastoma s/p resection 06/2019. 1/17 Presents to ED with reported seizures, wife reports noting seizure activity patient then fell out of bed but did not hit head. After EMS arrived patient had 2 additional seizures. On arrival to ED remained unresponsive with GCS 3 requiring intubation. CT head with no acute, noted residual masslike density in the right occipital lobe with similar appearing mass effect with 4-5 mm midline shift. Given 1 gram of keppra and 4 mg decadron. Critical Care Consulted for admission.   Recent admission 03/2020 with recurrent seizures. Keppra increased to 1500 BID at this time. Reportedly over last year has declined physically as well with cognition and memory, needing increased amount of help with ADLS. Failed Chemo, last Nov 2021, Salvage Therapy planned to start. On 12/29 patient tested positive for COVID. On 12/31 received casirivimab\imdevimab infusion per oncology  05/12/20: No acute events ON. Working on [employer] placement.    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     - followed by [name], failed chemotherapy, s/p resection 06/2019, recently offered salvage therapy vs hospice.Family wanting to continue treatments at this time.   Acute hypoxic respiratory failure due to status epilepticus     - resolved, extubated 05/08/2020     - he is on RA and sats are excellent  COVID + on 12/29      - resolved     - Pt no longer needs quarantine per CDC guidelines; isolation d/c'd 1/19     -completed steroid therapy  HTN Sinus Tachycardia      - continue  metoprolol and Norvasc; BP acceptable, HR acceptable  DM2     - Not on a home regimen      - SSI, change to DM diet, glucose checks  DVT prophylaxis: SCDs Code Status: FULL Family Communication: w/ wife at bedside   Status is: Inpatient  Remains inpatient appropriate because:Working on placement options.   Dispo: The patient is from: Home              Anticipated d/c is to: CIR              Anticipated d/c date is: 2 days              Patient currently is medically stable to d/c.   Difficult to place patient No  Consultants:   PCCM  Neurology  Subjective: "It's nice to meet you."  Objective: Vitals:   05/12/20 0400 05/12/20 0500 05/12/20 0821 05/12/20 1126  BP: (!) 144/92  (!) 147/88 139/85  Pulse: 74  74 73  Resp: $Remo'19  18 18  'vRFRF$ Temp: 98.6 F (37 C)  98 F (36.7 C) 98 F (36.7 C)  TempSrc: Oral     SpO2: 97%  97% 99%  Weight:  96.7 kg    Height:        Intake/Output Summary (Last 24 hours) at 05/12/2020 1402 Last data filed at 05/12/2020 0500 Gross per 24 hour  Intake 120 ml  Output 1000 ml  Net -880 ml   Filed Weights   05/06/20 1400 05/07/20 0124 05/12/20 0500  Weight: 93 kg 86.1 kg  96.7 kg    Examination:  General: 75 y.o. male resting in bed in NAD Eyes: PERRL, normal sclera ENMT: Nares patent w/o discharge, orophaynx clear, dentition normal, ears w/o discharge/lesions/ulcers Neck: Supple, trachea midline Cardiovascular: RRR, +S1, S2, no m/g/r, equal pulses throughout Respiratory: CTABL, no w/r/r, normal WOB GI: BS+, NDNT, no masses noted, no organomegaly noted MSK: No e/c/c Neuro: A&O x 2, no focal deficits Psyc: Appropriate interaction and affect, calm/cooperative   Data Reviewed: I have personally reviewed following labs and imaging studies.  CBC: Recent Labs  Lab 05/06/20 1353 05/06/20 1621 05/07/20 0320 05/12/20 0252  WBC 5.8  --  5.5 4.7  NEUTROABS 4.3  --   --   --   HGB 13.9 12.2* 12.8* 11.6*  HCT 43.3 36.0* 36.3* 32.3*  MCV  92.1  --  87.7 87.1  PLT 155  --  117* 195*   Basic Metabolic Panel: Recent Labs  Lab 05/06/20 1353 05/06/20 1621 05/06/20 1924 05/07/20 0320 05/08/20 0401 05/12/20 0252  NA 136 135  --  138 136 136  K 4.4 3.9  --  3.8 3.7 3.9  CL 100  --   --  103 103 101  CO2 20*  --   --  $R'23 23 26  'ld$ GLUCOSE 377*  --   --  217* 117* 205*  BUN 24*  --   --  $R'20 19 15  'SX$ CREATININE 0.89  --   --  0.66 0.59* 0.49*  CALCIUM 9.1  --   --  8.6* 8.5* 8.6*  MG  --   --  1.6* 1.6*  --   --   PHOS  --   --  3.8 3.5  --   --    GFR: Estimated Creatinine Clearance: 96.1 mL/min (A) (by C-G formula based on SCr of 0.49 mg/dL (L)). Liver Function Tests: Recent Labs  Lab 05/06/20 1353 05/07/20 0320  AST 31 20  ALT 29 23  ALKPHOS 85 67  BILITOT 1.4* 1.1  PROT 6.3* 5.1*  ALBUMIN 3.5 2.9*   No results for input(s): LIPASE, AMYLASE in the last 168 hours. No results for input(s): AMMONIA in the last 168 hours. Coagulation Profile: No results for input(s): INR, PROTIME in the last 168 hours. Cardiac Enzymes: No results for input(s): CKTOTAL, CKMB, CKMBINDEX, TROPONINI in the last 168 hours. BNP (last 3 results) No results for input(s): PROBNP in the last 8760 hours. HbA1C: No results for input(s): HGBA1C in the last 72 hours. CBG: Recent Labs  Lab 05/11/20 1253 05/11/20 1904 05/11/20 2353 05/12/20 0611 05/12/20 1155  GLUCAP 192* 218* 194* 188* 131*   Lipid Profile: No results for input(s): CHOL, HDL, LDLCALC, TRIG, CHOLHDL, LDLDIRECT in the last 72 hours. Thyroid Function Tests: No results for input(s): TSH, T4TOTAL, FREET4, T3FREE, THYROIDAB in the last 72 hours. Anemia Panel: No results for input(s): VITAMINB12, FOLATE, FERRITIN, TIBC, IRON, RETICCTPCT in the last 72 hours. Sepsis Labs: Recent Labs  Lab 05/06/20 1923 05/06/20 1924 05/07/20 0320 05/08/20 0401  PROCALCITON  --  0.58 1.21 0.54  LATICACIDVEN 2.3*  --  2.9*  --     Recent Results (from the past 240 hour(s))  Resp Panel  by RT-PCR (Flu A&B, Covid) Nasopharyngeal Swab     Status: Abnormal   Collection Time: 05/06/20  2:07 PM   Specimen: Nasopharyngeal Swab; Nasopharyngeal(NP) swabs in vial transport medium  Result Value Ref Range Status   SARS Coronavirus 2 by RT PCR POSITIVE (A) NEGATIVE Final  Comment: emailed L. Berdik RN 16:10 05/06/20 ([email or web address]) (NOTE) SARS-CoV-2 target nucleic acids are DETECTED.  The SARS-CoV-2 RNA is generally detectable in upper respiratory specimens during the acute phase of infection. Positive results are indicative of the presence of the identified virus, but do not rule out bacterial infection or co-infection with other pathogens not detected by the test. Clinical correlation with patient history and other diagnostic information is necessary to determine patient infection status. The expected result is Negative.  Fact Sheet for Patients: EntrepreneurPulse.com.au  Fact Sheet for Healthcare Providers: IncredibleEmployment.be  This test is not yet approved or cleared by the [place] FDA and  has been authorized for detection and/or diagnosis of SARS-CoV-2 by FDA under an Emergency Use Authorization (EUA).  This EUA will remain in effect (meaning this test can be used) for the duration of  the COVID-1 9 declaration under Section 564(b)(1) of the Act, 21 U.S.C. section 360bbb-3(b)(1), unless the authorization is terminated or revoked sooner.     Influenza A by PCR NEGATIVE NEGATIVE Final   Influenza B by PCR NEGATIVE NEGATIVE Final    Comment: (NOTE) The Xpert Xpress SARS-CoV-2/FLU/RSV plus assay is intended as an aid in the diagnosis of influenza from Nasopharyngeal swab specimens and should not be used as a sole basis for treatment. Nasal washings and aspirates are unacceptable for Xpert Xpress SARS-CoV-2/FLU/RSV testing.  Fact Sheet for Patients: EntrepreneurPulse.com.au  Fact Sheet for Healthcare  Providers: IncredibleEmployment.be  This test is not yet approved or cleared by the [place] FDA and has been authorized for detection and/or diagnosis of SARS-CoV-2 by FDA under an Emergency Use Authorization (EUA). This EUA will remain in effect (meaning this test can be used) for the duration of the COVID-19 declaration under Section 564(b)(1) of the Act, 21 U.S.C. section 360bbb-3(b)(1), unless the authorization is terminated or revoked.  Performed at [facility], [place] [address] [place], [place] [address]   Culture, blood (routine x 2)     Status: None   Collection Time: 05/06/20  4:25 PM   Specimen: BLOOD LEFT HAND  Result Value Ref Range Status   Specimen Description BLOOD LEFT HAND  Final   Special Requests   Final    BOTTLES DRAWN AEROBIC ONLY Blood Culture results may not be optimal due to an inadequate volume of blood received in culture bottles   Culture   Final    NO GROWTH 5 DAYS Performed at [facility], [place] [address] [place], [place] [address]    Report Status 05/11/2020 FINAL  Final  Culture, blood (routine x 2)     Status: None   Collection Time: 05/06/20  9:10 PM   Specimen: BLOOD RIGHT HAND  Result Value Ref Range Status   Specimen Description BLOOD RIGHT HAND  Final   Special Requests   Final    BOTTLES DRAWN AEROBIC AND ANAEROBIC Blood Culture adequate volume   Culture   Final    NO GROWTH 5 DAYS Performed at [facility], [place] [address], [place], [place] [address]    Report Status 05/11/2020 FINAL  Final  MRSA PCR Screening     Status: None   Collection Time: 05/07/20  1:25 AM   Specimen: Nasopharyngeal  Result Value Ref Range Status   MRSA by PCR NEGATIVE NEGATIVE Final    Comment:        The GeneXpert MRSA Assay (FDA approved for NASAL specimens only), is one component of a comprehensive MRSA colonization surveillance  program. It is not intended to diagnose MRSA infection nor to guide or monitor  treatment for MRSA infections. Performed at [facility], [place] [address], [place], [place] [address]       Radiology Studies: No results found.   Scheduled Meds: . amLODipine  10 mg Oral QPM  . atorvastatin  80 mg Oral QHS  . calcium citrate  200 mg of elemental calcium Oral Daily  . chlorhexidine gluconate (MEDLINE KIT)  15 mL Mouth Rinse BID  . Chlorhexidine Gluconate Cloth  6 each Topical Daily  . dexamethasone  2 mg Oral Daily  . dexamethasone  4 mg Oral QPM  . heparin injection (subcutaneous)  5,000 Units Subcutaneous Q8H  . insulin aspart  0-9 Units Subcutaneous Q6H  . levETIRAcetam  2,000 mg Oral BID  . mouth rinse  15 mL Mouth Rinse 10 times per day  . metoprolol succinate  12.5 mg Oral QPM  . pyridOXINE  100 mg Intravenous Daily   Continuous Infusions: . lactated ringers Stopped (05/09/20 1257)     LOS: 6 days    Time spent: 25 minutes spent in the coordination of care today.    Jonnie Finner, DO [facility]  If 7PM-7AM, please contact night-coverage [email or web address] 05/12/2020, 2:02 PM

## 2020-05-12 NOTE — PMR Pre-admission (Signed)
PMR Admission Coordinator Pre-Admission Assessment  Patient: [name] is an 75 y.o., male MRN: [id number] DOB: Aug 11, 1945 Height: $RemoveBefo'5\' 11"'nUFpEaxAfkw$  (180.3 cm) Weight: 96 kg  Insurance Information HMO:     PPO:      PCP:      IPA:      80/20: yes     OTHER:   PRIMARY: Medicare A & B      Policy#: [id number]      Subscriber: patient CM Name:       Phone#:      Fax#:  Pre-Cert#:       Employer:  Benefits:  Phone #: verified online via [employer] on 05/12/20     Name:  Eff. Date: Medicare Part A & B effective 12/20/10     Deduct: $1,556      Out of Pocket Max: NA      Life Max: NA CIR: 100% with Medicare approval      SNF: 100% days 1-20, 80% days 21-100 Outpatient: 80%     Co-Pay: 20% Home Health: 100%      Co-Pay:  DME: 80%     Co-Pay: 20% Providers: pt's choice  SECONDARY: [employer]      Policy#: [id number]     Phone#: [phone]  Financial Counselor:       Phone#:   The "Data Collection Information Summary" for patients in Inpatient Rehabilitation Facilities with attached "Privacy Act [place] Records" was provided and verbally reviewed with: Patient and Family  Emergency Contact Information Contact Information    Name Relation Home Work Mobile   [place] Spouse [phone]  [phone]      Current Medical History  Patient Admitting Diagnosis: debility s/p seizures, COVID-19  History of Present Illness: Pt is a 75 year old male with medical history significant for: glioblastoma s/p resection 06/2019, DM II, HTN, COVID + 04/17/20, recent admission on 03/2020 for recurrent seizures.  Pt presented to ED on 05/06/20 d/t reported seizures at home.  Wife witnessed seizure activity and noted pt fell out of bed but did not hit head.  After EMS arrived, pt had 2 additional seizures. Upon arrival to ED, pt remained unresponsive and was intubated.  CT of head noted no acute findings, but noted residual masslike density in right occipital lobe with similar appearing mass effect  with 4-5 mm midline shift.   Reportedly pt has declined physically and cognitively, requiring increased amount of help with ADLS.  Failed Chemo, last Nov 2021, Salvage Therapy planned to begin.  Therapy evaluations completed and CIR recommended d/t pt deficits in balance, functional mobility, strength and cognition.  Complete NIHSS TOTAL: 3  Patient's medical record from [facility] has been reviewed by the rehabilitation admission coordinator and physician.  Past Medical History  Past Medical History:  Diagnosis Date  . Anxiety   . Arthritis   . ASCVD (arteriosclerotic cardiovascular disease)    03/2004: DES x2 to the RCA; IMI with stent occlusion in 02/2005- suboptimal Plavix compliance  . CAD (coronary artery disease)   . Carcinoma of prostate ([place])    Clopidogrel held for biopsy  . Constipation due to pain medication   . Diabetes mellitus without complication ([place])   . Diabetic neuropathy ([place])   . Headache 2021  . Hyperlipidemia   . Hypertension   . Mild depression ([place])   . Morbid obesity ([place])   . Myocardial infarction ([place])   . Shortness of breath dyspnea   . Sleep apnea    no  longer uses cpap  . Stroke ([facility]) 2017  . Tobacco abuse    stopped smoking 06/28/09    Family History   family history includes Breast cancer in his sister; Kidney disease in his mother; Stroke in his father.  Prior Rehab/Hospitalizations Has the patient had prior rehab or hospitalizations prior to admission? Yes  Has the patient had major surgery during 100 days prior to admission? No   Current Medications  Current Facility-Administered Medications:  .  amLODipine (NORVASC) tablet 10 mg, 10 mg, Oral, QPM, Agarwala, Ravi, MD, 10 mg at 05/12/20 1712 .  atorvastatin (LIPITOR) tablet 80 mg, 80 mg, Oral, QHS, Agarwala, Ravi, MD, 80 mg at 05/12/20 2207 .  calcium citrate (CALCITRATE - dosed in mg elemental calcium) tablet 200 mg of elemental calcium, 200 mg of elemental calcium, Oral,  Daily, Agarwala, Ravi, MD, 200 mg of elemental calcium at 05/12/20 1101 .  chlorhexidine gluconate (MEDLINE KIT) (PERIDEX) 0.12 % solution 15 mL, 15 mL, Mouth Rinse, BID, Agarwala, Ravi, MD, 15 mL at 05/12/20 2206 .  Chlorhexidine Gluconate Cloth 2 % PADS 6 each, 6 each, Topical, Daily, Lynnell Catalan, MD, 6 each at 05/10/20 0933 .  dexamethasone (DECADRON) tablet 2 mg, 2 mg, Oral, Daily, Agarwala, Ravi, MD, 2 mg at 05/12/20 1101 .  dexamethasone (DECADRON) tablet 4 mg, 4 mg, Oral, QPM, Agarwala, Ravi, MD, 4 mg at 05/12/20 1712 .  fentaNYL (SUBLIMAZE) injection 12.5 mcg, 12.5 mcg, Intravenous, Q15 min PRN, Agarwala, Ravi, MD, 12.5 mcg at 05/10/20 1519 .  heparin injection 5,000 Units, 5,000 Units, Subcutaneous, Q8H, Agarwala, Ravi, MD, 5,000 Units at 05/13/20 0555 .  insulin aspart (novoLOG) injection 0-9 Units, 0-9 Units, Subcutaneous, Q6H, Lynnell Catalan, MD, 3 Units at 05/13/20 0555 .  lactated ringers infusion, , Intravenous, Continuous, Lynnell Catalan, MD, Stopped at 05/09/20 1257 .  levETIRAcetam (KEPPRA) tablet 2,000 mg, 2,000 mg, Oral, BID, Agarwala, Ravi, MD, 2,000 mg at 05/12/20 2206 .  LORazepam (ATIVAN) injection 1 mg, 1 mg, Intravenous, Q4H PRN, Lynnell Catalan, MD, 1 mg at 05/13/20 0129 .  MEDLINE mouth rinse, 15 mL, Mouth Rinse, 10 times per day, Agarwala, Daleen Bo, MD, 15 mL at 05/13/20 0556 .  metoprolol succinate (TOPROL-XL) 24 hr tablet 12.5 mg, 12.5 mg, Oral, QPM, Agarwala, Ravi, MD, 12.5 mg at 05/12/20 1712 .  metoprolol tartrate (LOPRESSOR) injection 5 mg, 5 mg, Intravenous, Q5 min PRN, Agarwala, Ravi, MD .  polyethylene glycol (MIRALAX / GLYCOLAX) packet 17 g, 17 g, Oral, Daily PRN, Azucena Fallen, MD .  pyridOXINE (B-6) injection 100 mg, 100 mg, Intravenous, Daily, Agarwala, Ravi, MD, 100 mg at 05/12/20 1101  Patients Current Diet:  Diet Order            Diet regular Room service appropriate? Yes; Fluid consistency: Thin  Diet effective now                  Precautions / Restrictions Precautions Precautions: Fall Precaution Comments: Peripheral visual field cut L>R, seizures Restrictions Weight Bearing Restrictions: No   Has the patient had 2 or more falls or a fall with injury in the past year? No  Prior Activity Level Household: stays home  Prior Functional Level Self Care: Did the patient need help bathing, dressing, using the toilet or eating? Needed some help  Indoor Mobility: Did the patient need assistance with walking from room to room (with or without device)? Needed some help  Stairs: Did the patient need assistance with internal or external stairs (with or  without device)? Dependent  Functional Cognition: Did the patient need help planning regular tasks such as shopping or remembering to take medications? Dependent  Home Assistive Devices / Equipment Home Equipment: Cane - single point,Walker - 2 wheels,Shower seat,Grab bars - tub/shower,Toilet riser  Prior Device Use: Indicate devices/aids used by the patient prior to current illness, exacerbation or injury? Walker and chair lift  Current Functional Level Cognition  Overall Cognitive Status: Impaired/Different from baseline Current Attention Level: Sustained Orientation Level: Oriented to person,Oriented to time Following Commands: Follows one step commands with increased time Safety/Judgement: Decreased awareness of safety,Decreased awareness of deficits General Comments: Patient only oriented to person. States that he is at home. Patient mistaking this therapist for his wife. Patient requires maximal multimodal and step-by-step instructions to sequence tasks assessed. Very poor attention to task requiring frequent redirection.    Extremity Assessment (includes Sensation/Coordination)  Upper Extremity Assessment: LUE deficits/detail LUE Deficits / Details: Weakness throughout. Patient undershoots. Per chart review weakness 2/2 glioblastoma. LUE Sensation:  WNL LUE Coordination: decreased fine motor,decreased gross motor  Lower Extremity Assessment: Defer to PT evaluation    ADLs  Overall ADL's : Needs assistance/impaired Grooming: Minimal assistance,Sitting Lower Body Dressing: Total assistance,Bed level Lower Body Dressing Details (indicate cue type and reason): Total A to don footwear seated EOB. Toilet Transfer: Total assistance,+2 for physical assistance,+2 for safety/equipment Toilet Transfer Details (indicate cue type and reason): Simulated with transfer to recliner and Stedy with Max A +2. Patient able to stand for <10 sec at a time.    Mobility  Overal bed mobility: Needs Assistance Bed Mobility: Sit to Supine Supine to sit: Mod assist General bed mobility comments: pt requires assist to elevate LEs onto bed, verbal cues for technique and hand placement    Transfers  Overall transfer level: Needs assistance Equipment used: Rolling walker (2 wheeled),1 person hand held assist Transfer via Lift Equipment: Stedy Transfers: Sit to/from [employer] to Stand: Max assist Squat pivot transfers: Max assist General transfer comment: pt requires PT cues for initiation as well as bilateral UE support or use of walker. PT provides knee block for suqat pivot transfer with use of drop arm recliner    Ambulation / Gait / Stairs / Wheelchair Mobility  Ambulation/Gait General Gait Details: nt    Posture / Balance Dynamic Sitting Balance Sitting balance - Comments: tendency for L lateral lean, minG-minA Balance Overall balance assessment: Needs assistance Sitting-balance support: Single extremity supported,Bilateral upper extremity supported,Feet supported Sitting balance-Leahy Scale: Poor Sitting balance - Comments: tendency for L lateral lean, minG-minA Postural control: Left lateral lean Standing balance support: Bilateral upper extremity supported Standing balance-Leahy Scale: Poor Standing balance comment: maxA to  maintain static standing with BUE support    Special needs/care consideration Continuous Drip IV  lactated ringers infusion: 27mL/hr, Skin Abrasion: abdomen, arm, hand/bilateral; Ecchymosis: abdomen/bilateral; Pressure Injury: mid sacrum stage 2, Diabetic management novoLOG: 0-9 units every 6 hours, Bowel and Bladder incontinence, External urinary catheter and Designated visitor Arael Piccione, wife   Previous Home Environment (from acute therapy documentation) Living Arrangements: Spouse/significant other  Lives With: Spouse Available Help at Discharge: Family,Available 24 hours/day Type of Home: House Home Layout: Two level,Bed/bath upstairs Alternate Level Stairs-Rails: Right,Left,Can reach both Alternate Level Stairs-Number of Steps: has stair lift Home Access: Stairs to enter Entrance Stairs-Rails: Right,Left,Can reach both Entrance Stairs-Number of Steps: 3 Bathroom Shower/Tub: Walk-in shower,Other (comment) (wife gives sponge baths) Bathroom Toilet: Handicapped height (also uses urinal) Bathroom Accessibility: Yes How Accessible: Accessible  via walker Home Care Services: No  Discharge Living Setting Plans for Discharge Living Setting: Patient's home Type of Home at Discharge: House Discharge Home Layout: Two level,Bed/bath upstairs Alternate Level Stairs-Number of Steps: stair lift Discharge Home Access: Stairs to enter Entrance Stairs-Rails: Right,Left,Can reach both Entrance Stairs-Number of Steps: 3 Discharge Bathroom Shower/Tub: Walk-in shower,Other (comment) (wife gives sponge baths) Discharge Bathroom Toilet: Handicapped height Discharge Bathroom Accessibility: Yes How Accessible: Accessible via walker Does the patient have any problems obtaining your medications?: No  Social/Family/Support Systems Patient Roles: Spouse,Parent Anticipated Caregiver: Ron Beske, wife and son Anticipated Caregiver's Contact Information: [name] [phone] Caregiver  Availability: 24/7 Discharge Plan Discussed with Primary Caregiver: Yes Is Caregiver In Agreement with Plan?: Yes Does Caregiver/Family have Issues with Lodging/Transportation while Pt is in Rehab?: No  Goals Patient/Family Goal for Rehab: Min-Mod A: PT/OT Expected length of stay: 14--18 days Pt/Family Agrees to Admission and willing to participate: Yes Program Orientation Provided & Reviewed with Pt/Caregiver Including Roles  & Responsibilities: Yes  Decrease burden of Care through IP rehab admission: NA  Possible need for SNF placement upon discharge: NA  Patient Condition: I have reviewed medical records from [facility], spoken with CSW, and patient and spouse. I met with patient at the bedside for inpatient rehabilitation assessment.  Patient will benefit from ongoing PT and OT, can actively participate in 3 hours of therapy a day 5 days of the week, and can make measurable gains during the admission.  Patient will also benefit from the coordinated team approach during an Inpatient Acute Rehabilitation admission.  The patient will receive intensive therapy as well as Rehabilitation physician, nursing, social worker, and care management interventions.  Due to bladder management, bowel management, safety, skin/wound care, disease management, medication administration, pain management and patient education the patient requires 24 hour a day rehabilitation nursing.  The patient is currently Mod-Max A with mobility and total A with basic ADLs.  Discharge setting and therapy post discharge at home with home health is anticipated.  Patient has agreed to participate in the Acute Inpatient Rehabilitation Program and will admit today.  Preadmission Screen Completed By:  Bethel Born, 05/13/2020 10:44 AM ______________________________________________________________________   Discussed status with Dr. Naaman Plummer on 05/13/20 at 10:44 AM  and received approval for admission  today.  Admission Coordinator:  Bethel Born, CCC-SLP, time 10:44 AM/Date 05/13/20    Assessment/Plan: Diagnosis: debility related to seizures, COVID 19 1. Does the need for close, 24 hr/day Medical supervision in concert with the patient's rehab needs make it unreasonable for this patient to be served in a less intensive setting? Yes 2. Co-Morbidities requiring supervision/potential complications: DM, HTN, glioblastoma 3. Due to bladder management, bowel management, safety, skin/wound care, disease management, medication administration, pain management and patient education, does the patient require 24 hr/day rehab nursing? Yes 4. Does the patient require coordinated care of a physician, rehab nurse, PT, OT, and SLP to address physical and functional deficits in the context of the above medical diagnosis(es)? Yes Addressing deficits in the following areas: balance, endurance, locomotion, strength, transferring, bowel/bladder control, bathing, dressing, feeding, grooming, toileting and psychosocial support 5. Can the patient actively participate in an intensive therapy program of at least 3 hrs of therapy 5 days a week? Yes 6. The potential for patient to make measurable gains while on inpatient rehab is excellent 7. Anticipated functional outcomes upon discharge from inpatient rehab: min assist and mod assist PT, min assist and mod assist OT, n/a SLP  8. Estimated rehab length of stay to reach the above functional goals is: 14-18 days 9. Anticipated discharge destination: Home 10. Overall Rehab/Functional Prognosis: excellent   MD Signature: Meredith Staggers, MD, [place] [facility] 05/13/2020

## 2020-05-13 ENCOUNTER — Inpatient Hospital Stay (HOSPITAL_COMMUNITY): Payer: Medicare Other

## 2020-05-13 ENCOUNTER — Inpatient Hospital Stay (HOSPITAL_COMMUNITY)
Admission: RE | Admit: 2020-05-13 | Discharge: 2020-06-11 | DRG: 945 | Disposition: A | Discharge status: Hospice | Payer: Medicare Other | Source: Intra-hospital | Attending: Physical Medicine & Rehabilitation | Admitting: Physical Medicine & Rehabilitation

## 2020-05-13 ENCOUNTER — Encounter (HOSPITAL_COMMUNITY): Payer: Self-pay | Admitting: Physical Medicine & Rehabilitation

## 2020-05-13 ENCOUNTER — Other Ambulatory Visit: Payer: Self-pay

## 2020-05-13 DIAGNOSIS — R059 Cough, unspecified: Secondary | ICD-10-CM

## 2020-05-13 DIAGNOSIS — I70213 Atherosclerosis of native arteries of extremities with intermittent claudication, bilateral legs: Secondary | ICD-10-CM

## 2020-05-13 DIAGNOSIS — C714 Malignant neoplasm of occipital lobe: Secondary | ICD-10-CM | POA: Diagnosis present

## 2020-05-13 DIAGNOSIS — Z9079 Acquired absence of other genital organ(s): Secondary | ICD-10-CM

## 2020-05-13 DIAGNOSIS — M1990 Unspecified osteoarthritis, unspecified site: Secondary | ICD-10-CM | POA: Diagnosis present

## 2020-05-13 DIAGNOSIS — E785 Hyperlipidemia, unspecified: Secondary | ICD-10-CM | POA: Diagnosis present

## 2020-05-13 DIAGNOSIS — Z841 Family history of disorders of kidney and ureter: Secondary | ICD-10-CM | POA: Diagnosis not present

## 2020-05-13 DIAGNOSIS — G9340 Encephalopathy, unspecified: Secondary | ICD-10-CM | POA: Diagnosis present

## 2020-05-13 DIAGNOSIS — R5383 Other fatigue: Secondary | ICD-10-CM | POA: Diagnosis not present

## 2020-05-13 DIAGNOSIS — R1310 Dysphagia, unspecified: Secondary | ICD-10-CM | POA: Diagnosis not present

## 2020-05-13 DIAGNOSIS — R739 Hyperglycemia, unspecified: Secondary | ICD-10-CM

## 2020-05-13 DIAGNOSIS — I10 Essential (primary) hypertension: Secondary | ICD-10-CM

## 2020-05-13 DIAGNOSIS — I252 Old myocardial infarction: Secondary | ICD-10-CM | POA: Diagnosis not present

## 2020-05-13 DIAGNOSIS — H9190 Unspecified hearing loss, unspecified ear: Secondary | ICD-10-CM | POA: Diagnosis present

## 2020-05-13 DIAGNOSIS — Z8546 Personal history of malignant neoplasm of prostate: Secondary | ICD-10-CM | POA: Diagnosis not present

## 2020-05-13 DIAGNOSIS — Z823 Family history of stroke: Secondary | ICD-10-CM | POA: Diagnosis not present

## 2020-05-13 DIAGNOSIS — R569 Unspecified convulsions: Secondary | ICD-10-CM | POA: Diagnosis present

## 2020-05-13 DIAGNOSIS — C719 Malignant neoplasm of brain, unspecified: Secondary | ICD-10-CM | POA: Diagnosis not present

## 2020-05-13 DIAGNOSIS — Z888 Allergy status to other drugs, medicaments and biological substances status: Secondary | ICD-10-CM

## 2020-05-13 DIAGNOSIS — E1140 Type 2 diabetes mellitus with diabetic neuropathy, unspecified: Secondary | ICD-10-CM | POA: Diagnosis present

## 2020-05-13 DIAGNOSIS — Z9981 Dependence on supplemental oxygen: Secondary | ICD-10-CM

## 2020-05-13 DIAGNOSIS — J9811 Atelectasis: Secondary | ICD-10-CM | POA: Diagnosis not present

## 2020-05-13 DIAGNOSIS — Z79899 Other long term (current) drug therapy: Secondary | ICD-10-CM

## 2020-05-13 DIAGNOSIS — G40909 Epilepsy, unspecified, not intractable, without status epilepticus: Secondary | ICD-10-CM | POA: Diagnosis not present

## 2020-05-13 DIAGNOSIS — J988 Other specified respiratory disorders: Secondary | ICD-10-CM

## 2020-05-13 DIAGNOSIS — Z87891 Personal history of nicotine dependence: Secondary | ICD-10-CM

## 2020-05-13 DIAGNOSIS — L89150 Pressure ulcer of sacral region, unstageable: Secondary | ICD-10-CM | POA: Diagnosis present

## 2020-05-13 DIAGNOSIS — Z803 Family history of malignant neoplasm of breast: Secondary | ICD-10-CM | POA: Diagnosis not present

## 2020-05-13 DIAGNOSIS — B9620 Unspecified Escherichia coli [E. coli] as the cause of diseases classified elsewhere: Secondary | ICD-10-CM | POA: Diagnosis not present

## 2020-05-13 DIAGNOSIS — I2510 Atherosclerotic heart disease of native coronary artery without angina pectoris: Secondary | ICD-10-CM | POA: Diagnosis present

## 2020-05-13 DIAGNOSIS — E1165 Type 2 diabetes mellitus with hyperglycemia: Secondary | ICD-10-CM | POA: Diagnosis present

## 2020-05-13 DIAGNOSIS — N390 Urinary tract infection, site not specified: Secondary | ICD-10-CM | POA: Diagnosis not present

## 2020-05-13 DIAGNOSIS — T380X5A Adverse effect of glucocorticoids and synthetic analogues, initial encounter: Secondary | ICD-10-CM | POA: Diagnosis present

## 2020-05-13 DIAGNOSIS — Z951 Presence of aortocoronary bypass graft: Secondary | ICD-10-CM

## 2020-05-13 DIAGNOSIS — R5381 Other malaise: Principal | ICD-10-CM | POA: Diagnosis present

## 2020-05-13 DIAGNOSIS — R0902 Hypoxemia: Secondary | ICD-10-CM

## 2020-05-13 DIAGNOSIS — D696 Thrombocytopenia, unspecified: Secondary | ICD-10-CM | POA: Diagnosis not present

## 2020-05-13 DIAGNOSIS — R531 Weakness: Secondary | ICD-10-CM | POA: Diagnosis present

## 2020-05-13 DIAGNOSIS — L8990 Pressure ulcer of unspecified site, unspecified stage: Secondary | ICD-10-CM

## 2020-05-13 LAB — GLUCOSE, CAPILLARY
Glucose-Capillary: 122 mg/dL — ABNORMAL HIGH (ref 70–99)
Glucose-Capillary: 183 mg/dL — ABNORMAL HIGH (ref 70–99)
Glucose-Capillary: 227 mg/dL — ABNORMAL HIGH (ref 70–99)
Glucose-Capillary: 245 mg/dL — ABNORMAL HIGH (ref 70–99)
Glucose-Capillary: 262 mg/dL — ABNORMAL HIGH (ref 70–99)
Glucose-Capillary: 290 mg/dL — ABNORMAL HIGH (ref 70–99)

## 2020-05-13 MED ORDER — VITAMIN B-6 100 MG PO TABS: 100.0000 mg | ORAL_TABLET | Freq: Every day | ORAL | Status: DC

## 2020-05-13 MED ORDER — GUAIFENESIN-DM 100-10 MG/5ML PO SYRP
5.0000 mL | ORAL_SOLUTION | Freq: Four times a day (QID) | ORAL | Status: DC | PRN
  Administered 2020-05-19 – 2020-05-23 (×2): 10 mL via ORAL
  Filled 2020-05-13: qty 10

## 2020-05-13 MED ORDER — POLYETHYLENE GLYCOL 3350 17 G PO PACK: 17.0000 g | PACK | Freq: Every day | ORAL | Status: DC | PRN

## 2020-05-13 MED ORDER — LEVETIRACETAM 1000 MG PO TABS: 2000.0000 mg | ORAL_TABLET | Freq: Two times a day (BID) | ORAL | Status: DC

## 2020-05-13 MED ORDER — BISACODYL 10 MG RE SUPP
10.0000 mg | Freq: Every day | RECTAL | Status: DC | PRN
  Administered 2020-05-14: 10 mg via RECTAL
  Filled 2020-05-13: qty 1

## 2020-05-13 MED ORDER — LORAZEPAM 2 MG/ML IJ SOLN: 1.0000 mg | INTRAMUSCULAR | Status: DC | PRN

## 2020-05-13 MED ORDER — DIPHENHYDRAMINE HCL 12.5 MG/5ML PO ELIX
12.5000 mg | ORAL_SOLUTION | Freq: Four times a day (QID) | ORAL | Status: DC | PRN
  Administered 2020-05-15 – 2020-06-03 (×2): 25 mg via ORAL
  Filled 2020-05-13 (×2): qty 10

## 2020-05-13 MED ORDER — INSULIN ASPART 100 UNIT/ML ~~LOC~~ SOLN
0.0000 [IU] | Freq: Three times a day (TID) | SUBCUTANEOUS | Status: DC
  Administered 2020-05-13 – 2020-05-14 (×3): 2 [IU] via SUBCUTANEOUS
  Administered 2020-05-14: 3 [IU] via SUBCUTANEOUS
  Administered 2020-05-15: 2 [IU] via SUBCUTANEOUS
  Administered 2020-05-15: 1 [IU] via SUBCUTANEOUS
  Administered 2020-05-16 – 2020-05-17 (×2): 2 [IU] via SUBCUTANEOUS
  Administered 2020-05-17 – 2020-05-18 (×2): 1 [IU] via SUBCUTANEOUS
  Administered 2020-05-18 – 2020-05-19 (×3): 2 [IU] via SUBCUTANEOUS
  Administered 2020-05-19: 3 [IU] via SUBCUTANEOUS
  Administered 2020-05-19: 1 [IU] via SUBCUTANEOUS
  Administered 2020-05-20: 2 [IU] via SUBCUTANEOUS
  Administered 2020-05-20: 3 [IU] via SUBCUTANEOUS
  Administered 2020-05-20 – 2020-05-21 (×4): 2 [IU] via SUBCUTANEOUS
  Administered 2020-05-22: 3 [IU] via SUBCUTANEOUS
  Administered 2020-05-22: 2 [IU] via SUBCUTANEOUS
  Administered 2020-05-22: 3 [IU] via SUBCUTANEOUS
  Administered 2020-05-23: 2 [IU] via SUBCUTANEOUS
  Administered 2020-05-23 (×2): 3 [IU] via SUBCUTANEOUS
  Administered 2020-05-24: 1 [IU] via SUBCUTANEOUS
  Administered 2020-05-24 (×2): 2 [IU] via SUBCUTANEOUS
  Administered 2020-05-25 (×3): 3 [IU] via SUBCUTANEOUS
  Administered 2020-05-26: 2 [IU] via SUBCUTANEOUS
  Administered 2020-05-26: 3 [IU] via SUBCUTANEOUS
  Administered 2020-05-26 – 2020-05-27 (×2): 2 [IU] via SUBCUTANEOUS
  Administered 2020-05-27: 5 [IU] via SUBCUTANEOUS
  Administered 2020-05-27: 3 [IU] via SUBCUTANEOUS
  Administered 2020-05-28: 5 [IU] via SUBCUTANEOUS
  Administered 2020-05-28: 2 [IU] via SUBCUTANEOUS
  Administered 2020-05-28: 3 [IU] via SUBCUTANEOUS
  Administered 2020-05-29: 1 [IU] via SUBCUTANEOUS
  Administered 2020-05-29: 3 [IU] via SUBCUTANEOUS
  Administered 2020-05-29 – 2020-05-30 (×3): 5 [IU] via SUBCUTANEOUS
  Administered 2020-05-31: 2 [IU] via SUBCUTANEOUS
  Administered 2020-05-31: 1 [IU] via SUBCUTANEOUS
  Administered 2020-05-31 – 2020-06-01 (×3): 2 [IU] via SUBCUTANEOUS
  Administered 2020-06-01: 3 [IU] via SUBCUTANEOUS
  Administered 2020-06-02: 1 [IU] via SUBCUTANEOUS
  Administered 2020-06-02: 3 [IU] via SUBCUTANEOUS
  Administered 2020-06-04: 2 [IU] via SUBCUTANEOUS
  Administered 2020-06-04: 5 [IU] via SUBCUTANEOUS
  Administered 2020-06-04 – 2020-06-05 (×2): 2 [IU] via SUBCUTANEOUS
  Administered 2020-06-05: 3 [IU] via SUBCUTANEOUS
  Administered 2020-06-05: 1 [IU] via SUBCUTANEOUS
  Administered 2020-06-06: 2 [IU] via SUBCUTANEOUS
  Administered 2020-06-06: 1 [IU] via SUBCUTANEOUS
  Administered 2020-06-06 – 2020-06-07 (×2): 2 [IU] via SUBCUTANEOUS
  Administered 2020-06-07: 3 [IU] via SUBCUTANEOUS
  Administered 2020-06-07 – 2020-06-08 (×2): 2 [IU] via SUBCUTANEOUS
  Administered 2020-06-08: 3 [IU] via SUBCUTANEOUS
  Administered 2020-06-09: 1 [IU] via SUBCUTANEOUS
  Administered 2020-06-09 – 2020-06-10 (×2): 2 [IU] via SUBCUTANEOUS
  Administered 2020-06-10: 1 [IU] via SUBCUTANEOUS
  Administered 2020-06-11: 2 [IU] via SUBCUTANEOUS

## 2020-05-13 MED ORDER — LEVETIRACETAM 750 MG PO TABS
2000.0000 mg | ORAL_TABLET | Freq: Two times a day (BID) | ORAL | Status: DC
  Administered 2020-05-13 – 2020-05-15 (×5): 2000 mg via ORAL
  Filled 2020-05-13 (×7): qty 1

## 2020-05-13 MED ORDER — PROCHLORPERAZINE EDISYLATE 10 MG/2ML IJ SOLN: 5.0000 mg | Freq: Four times a day (QID) | INTRAMUSCULAR | Status: DC | PRN

## 2020-05-13 MED ORDER — TRAZODONE HCL 50 MG PO TABS
25.0000 mg | ORAL_TABLET | Freq: Every evening | ORAL | Status: DC | PRN
  Administered 2020-05-14 – 2020-06-07 (×17): 50 mg via ORAL
  Filled 2020-05-13 (×20): qty 1

## 2020-05-13 MED ORDER — FLEET ENEMA 7-19 GM/118ML RE ENEM: 1.0000 | ENEMA | Freq: Once | RECTAL | Status: DC | PRN

## 2020-05-13 MED ORDER — CHLORHEXIDINE GLUCONATE CLOTH 2 % EX PADS
6.0000 | MEDICATED_PAD | Freq: Every day | CUTANEOUS | Status: DC
  Administered 2020-05-17 – 2020-06-04 (×9): 6 via TOPICAL

## 2020-05-13 MED ORDER — INSULIN ASPART 100 UNIT/ML ~~LOC~~ SOLN
0.0000 [IU] | Freq: Every day | SUBCUTANEOUS | Status: DC
  Administered 2020-05-13 – 2020-05-25 (×3): 2 [IU] via SUBCUTANEOUS

## 2020-05-13 MED ORDER — DEXAMETHASONE 2 MG PO TABS
2.0000 mg | ORAL_TABLET | Freq: Every day | ORAL | Status: DC
  Administered 2020-05-14 – 2020-05-17 (×4): 2 mg via ORAL
  Filled 2020-05-13 (×5): qty 1

## 2020-05-13 MED ORDER — HEPARIN SODIUM (PORCINE) 5000 UNIT/ML IJ SOLN
5000.0000 [IU] | Freq: Three times a day (TID) | INTRAMUSCULAR | Status: DC
  Administered 2020-05-13 – 2020-06-11 (×85): 5000 [IU] via SUBCUTANEOUS
  Filled 2020-05-13 (×85): qty 1

## 2020-05-13 MED ORDER — ACETAMINOPHEN 325 MG PO TABS
325.0000 mg | ORAL_TABLET | ORAL | Status: DC | PRN
  Administered 2020-05-16 – 2020-06-08 (×25): 650 mg via ORAL
  Filled 2020-05-13 (×25): qty 2

## 2020-05-13 MED ORDER — ORAL CARE MOUTH RINSE
15.0000 mL | OROMUCOSAL | Status: DC
  Administered 2020-05-13 – 2020-05-26 (×60): 15 mL via OROMUCOSAL

## 2020-05-13 MED ORDER — VITAMIN B-6 100 MG PO TABS
100.0000 mg | ORAL_TABLET | Freq: Every day | ORAL | Status: DC
  Administered 2020-05-14 – 2020-06-11 (×28): 100 mg via ORAL
  Filled 2020-05-13 (×30): qty 1

## 2020-05-13 MED ORDER — LORAZEPAM 0.5 MG PO TABS
0.5000 mg | ORAL_TABLET | Freq: Four times a day (QID) | ORAL | Status: DC | PRN
  Administered 2020-05-13 – 2020-05-14 (×2): 0.5 mg via ORAL
  Filled 2020-05-13 (×2): qty 1

## 2020-05-13 MED ORDER — AMLODIPINE BESYLATE 10 MG PO TABS
10.0000 mg | ORAL_TABLET | Freq: Every evening | ORAL | Status: DC
  Administered 2020-05-13 – 2020-06-09 (×26): 10 mg via ORAL
  Filled 2020-05-13 (×27): qty 1

## 2020-05-13 MED ORDER — PROCHLORPERAZINE MALEATE 5 MG PO TABS: 5.0000 mg | ORAL_TABLET | Freq: Four times a day (QID) | ORAL | Status: DC | PRN

## 2020-05-13 MED ORDER — PROCHLORPERAZINE 25 MG RE SUPP: 12.5000 mg | Freq: Four times a day (QID) | RECTAL | Status: DC | PRN

## 2020-05-13 MED ORDER — ALUM & MAG HYDROXIDE-SIMETH 200-200-20 MG/5ML PO SUSP: 30.0000 mL | ORAL | Status: DC | PRN

## 2020-05-13 MED ORDER — METOPROLOL SUCCINATE ER 25 MG PO TB24
12.5000 mg | ORAL_TABLET | Freq: Every evening | ORAL | Status: DC
  Administered 2020-05-13 – 2020-06-09 (×27): 12.5 mg via ORAL
  Filled 2020-05-13 (×27): qty 1

## 2020-05-13 MED ORDER — ATORVASTATIN CALCIUM 80 MG PO TABS
80.0000 mg | ORAL_TABLET | Freq: Every day | ORAL | Status: DC
  Administered 2020-05-13 – 2020-06-10 (×29): 80 mg via ORAL
  Filled 2020-05-13 (×29): qty 1

## 2020-05-13 MED ORDER — DEXAMETHASONE 4 MG PO TABS
4.0000 mg | ORAL_TABLET | Freq: Every evening | ORAL | Status: DC
  Administered 2020-05-13 – 2020-05-16 (×4): 4 mg via ORAL
  Filled 2020-05-13 (×4): qty 1

## 2020-05-13 MED ORDER — CALCIUM CITRATE 950 (200 CA) MG PO TABS
200.0000 mg | ORAL_TABLET | Freq: Every day | ORAL | Status: DC
  Administered 2020-05-14 – 2020-06-11 (×28): 200 mg via ORAL
  Filled 2020-05-13 (×29): qty 1

## 2020-05-13 MED ORDER — DEXAMETHASONE 2 MG PO TABS: 2.0000 mg | ORAL_TABLET | ORAL | Status: DC

## 2020-05-13 MED ORDER — POLYETHYLENE GLYCOL 3350 17 G PO PACK
17.0000 g | PACK | Freq: Every day | ORAL | Status: DC | PRN
  Administered 2020-05-14: 17 g via ORAL
  Filled 2020-05-13: qty 1

## 2020-05-13 NOTE — H&P (Signed)
Physical Medicine and Rehabilitation Admission H&P    Chief Complaint  Patient presents with  . Debility    HPI: [name] is a 75 year old male with history of ASCVD,T2DM, prostate cancer s/p prostatectomy, GBM s/p RSXN 06/2019, Covid 19 04/17/20, seizures who was admitted on 05/06/20 with fall due to recurrent seizures. Patient has failed chemo and salvage therapy was in process of being started. CT head showed residual masslike density right occipital lobe with similar edema and mass effect c/w 11/20201. GCS 3 at admission and he was loaded with Keppra  Head and started on IV decadron. 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 He tolerated extubation without difficulty on 01/19 but had bouts of agitation. LT-EEG showed no further seizures but wife reported increase in agitation with increase in Keppra dose 12/21. Dr. Melynda Ripple recommended addition of Pyridoxine 100 mg daily for possible Keppra related agitation. Lethargy has resolved but he continues to be limited by weakness requiring increased level of assistance with basic ADL as well as Stedy lift with standing attempts.  CIR was recommended due to functional decline.     Review of Systems  Constitutional: Negative for chills and fever.  HENT: Positive for hearing loss.   Eyes: Negative for double vision.       Left HH due to tumor  Respiratory: Positive for shortness of breath (has required oxygen since last night). Negative for cough.   Cardiovascular: Negative for chest pain.  Gastrointestinal: Negative for constipation, heartburn and nausea.  Genitourinary: Negative for dysuria and frequency.  Musculoskeletal: Negative for back pain and myalgias.  Skin: Negative for rash.  Neurological: Positive for weakness.  Psychiatric/Behavioral: Positive for memory loss.      Past Medical History:  Diagnosis Date  . Anxiety    . Arthritis   . ASCVD (arteriosclerotic cardiovascular disease)    03/2004: DES x2 to the RCA; IMI with stent occlusion in 02/2005- suboptimal Plavix compliance  . CAD (coronary artery disease)   . Carcinoma of prostate (HCC)    Clopidogrel held for biopsy  . Constipation due to pain medication   . Diabetes mellitus without complication (HCC)   . Diabetic neuropathy (HCC)   . Headache 2021  . Hyperlipidemia   . Hypertension   . Mild depression (HCC)   . Morbid obesity (HCC)   . Myocardial infarction (HCC)   . Shortness of breath dyspnea   . Sleep apnea    no longer uses cpap  . Stroke (HCC) 2017  . Tobacco abuse    stopped smoking 06/28/09    Past Surgical History:  Procedure Laterality Date  . APPLICATION OF CRANIAL NAVIGATION N/A 07/04/2019   Procedure: APPLICATION OF CRANIAL NAVIGATION;  Surgeon: Coletta Memos, MD;  Location: [facility] OR;  Service: Neurosurgery;  Laterality: N/A;  . BACK SURGERY    . CARDIAC CATHETERIZATION     X 1 stent before having CABG  . CORONARY ARTERY BYPASS GRAFT  06/28/2008   X4  . CRANIOTOMY Right 07/04/2019   Procedure: Right occipital craniotomy for tumor with brainlab;  Surgeon: Coletta Memos, MD;  Location: [facility] OR;  Service: Neurosurgery;  Laterality: Right;  . ENDARTERECTOMY Right 07/03/2015   Procedure: RIGHT CAROTID ENDARTERECTOMY WITH BOVINE PERICARDIUM PATCH ANGIOPLASTY;  Surgeon: Nada Libman, MD;  Location: [facility] OR;  Service: Vascular;  Laterality: Right;  . LUMBAR LAMINECTOMY/DECOMPRESSION MICRODISCECTOMY N/A 09/06/2014   Procedure: LUMBER DECOMPRESSION L3-5;  Surgeon: Melina Schools, MD;  Location: [place];  Service: Orthopedics;  Laterality: N/A;  . LUMBAR SPINE SURGERY  2002  . PROSTATE BIOPSY    . PROSTATECTOMY  02/2007  . VASECTOMY      Family History  Problem Relation Age of Onset  . Kidney disease Mother   . Stroke Father   . Breast cancer Sister        breast progressed into bone  . Colon cancer Neg Hx   . Prostate cancer Neg Hx    . Pancreatic cancer Neg Hx     Social History:  Married. Wife was assisting with ADLs PTA and has had decline in the reports that he quit smoking about 11 years ago. His smoking use included cigarettes. He has a 40.00 pack-year smoking history. He quit smokeless tobacco use about 10 years ago. He reports current alcohol use of about 3.0 standard drinks of alcohol per week. He reports that he does not use drugs.   Allergies  Allergen Reactions  . Other Other (See Comments)    THE PATIENT'S SKIN HAS BECOME VERY THIN- BRUISES AND TEARS VERY EASILY!!  . Tape Other (See Comments)    THE PATIENT'S SKIN HAS BECOME VERY THIN- BRUISES AND TEARS VERY EASILY!!  . Lisinopril Cough    Medications Prior to Admission  Medication Sig Dispense Refill  . amLODipine (NORVASC) 10 MG tablet TAKE 1 TABLET(10 MG) BY MOUTH EVERY EVENING (Patient taking differently: Take 10 mg by mouth every evening.) 90 tablet 1  . atorvastatin (LIPITOR) 80 MG tablet TAKE 1 TABLET(80 MG) BY MOUTH AT BEDTIME (Patient taking differently: Take 80 mg by mouth at bedtime.) 90 tablet 1  . calcium citrate (CALCITRATE - DOSED IN MG ELEMENTAL CALCIUM) 950 (200 Ca) MG tablet Take 1 tablet (200 mg of elemental calcium total) by mouth daily. (Patient taking differently: Take 200 mg of elemental calcium by mouth daily after breakfast.) 30 tablet 0  . dexamethasone (DECADRON) 2 MG tablet Take 1-2 tablets (2-4 mg total) by mouth See admin instructions. Take 1 tablet (2mg ) by mouth in the morning and 2 tablets (4mg ) by mouth in the evening. (Patient taking differently: Take 2-4 mg by mouth See admin instructions. Take 2 mg by mouth in the morning and 4 mg in the evening)    . levETIRAcetam (KEPPRA) 750 MG tablet Take 2 tablets (1,500 mg total) by mouth 2 (two) times daily. (Patient taking differently: Take 1,500 mg by mouth in the morning and at bedtime.) 120 tablet 3  . LORazepam (ATIVAN) 0.5 MG tablet Take 0.5 mg by mouth See admin instructions.  Take 0.5 mg by mouth as directed prior to MRI(s)    . metoprolol succinate (TOPROL-XL) 25 MG 24 hr tablet TAKE 1/2 TABLET(12.5 MG) BY MOUTH EVERY EVENING (Patient taking differently: Take 12.5 mg by mouth every evening.) 45 tablet 2    Drug Regimen Review  Drug regimen was reviewed and remains appropriate with no significant issues identified  Home: Home Living Family/patient expects to be discharged to:: Private residence Living Arrangements: Spouse/significant other Available Help at Discharge: Family,Available 24 hours/day Type of Home: House Home Access: Stairs to enter [employer] of Steps: 3 Entrance Stairs-Rails: Right,Left,Can reach both Home Layout: Two level,Bed/bath upstairs Alternate Level Stairs-Number of Steps: has stair lift Alternate Level Stairs-Rails: Right,Left,Can reach both Bathroom Shower/Tub: Walk-in shower,Other (comment) (wife gives sponge baths) Bathroom Toilet: Handicapped height (also uses urinal) Bathroom Accessibility: Yes Home Equipment: Cane - single point,Walker - 2 wheels,Shower seat,Grab  bars - tub/shower,Toilet riser  Lives With: Spouse   Functional History: Prior Function Level of Independence: Needs assistance Gait / Transfers Assistance Needed: RW at baseline for mobility in home and community dwellings. Recent decline in function requiring increased external assist from family. ADL's / Homemaking Assistance Needed: Wife assist with ADLs at baseline. More assist required recently 2/2 steady decline in function.  Functional Status:  Mobility: Bed Mobility Overal bed mobility: Needs Assistance Bed Mobility: Sit to Supine Supine to sit: Mod assist General bed mobility comments: pt requires assist to elevate LEs onto bed, verbal cues for technique and hand placement Transfers Overall transfer level: Needs assistance Equipment used: Rolling walker (2 wheeled),1 person hand held assist Transfer via Lift Equipment: Stedy Transfers:  Sit to/from [employer] to Stand: Max assist Squat pivot transfers: Max assist General transfer comment: pt requires PT cues for initiation as well as bilateral UE support or use of walker. PT provides knee block for suqat pivot transfer with use of drop arm recliner Ambulation/Gait General Gait Details: nt    ADL: ADL Overall ADL's : Needs assistance/impaired Grooming: Minimal assistance,Sitting Lower Body Dressing: Total assistance,Bed level Lower Body Dressing Details (indicate cue type and reason): Total A to don footwear seated EOB. Toilet Transfer: Total assistance,+2 for physical assistance,+2 for safety/equipment Toilet Transfer Details (indicate cue type and reason): Simulated with transfer to recliner and Stedy with Max A +2. Patient able to stand for <10 sec at a time.  Cognition: Cognition Overall Cognitive Status: Impaired/Different from baseline Orientation Level: Oriented to person,Oriented to time Cognition Arousal/Alertness: Awake/alert Behavior During Therapy: WFL for tasks assessed/performed Overall Cognitive Status: Impaired/Different from baseline Area of Impairment: Memory,Following commands,Safety/judgement,Awareness,Problem solving Orientation Level: Disoriented to,Time,Situation,Place Current Attention Level: Sustained Memory: Decreased recall of precautions,Decreased short-term memory Following Commands: Follows one step commands with increased time Safety/Judgement: Decreased awareness of safety,Decreased awareness of deficits Awareness: Intellectual Problem Solving: Slow processing,Decreased initiation,Difficulty sequencing,Requires tactile cues,Requires verbal cues General Comments: Patient only oriented to person. States that he is at home. Patient mistaking this therapist for his wife. Patient requires maximal multimodal and step-by-step instructions to sequence tasks assessed. Very poor attention to task requiring frequent  redirection.  : Blood pressure (!) 117/58, pulse 70, temperature 97.9 F (36.6 C), temperature source Oral, resp. rate 18, height 5\' 11"  (1.803 m), weight 96 kg, SpO2 95 %. Physical Exam Vitals and nursing note reviewed.  Constitutional:      Appearance: Normal appearance.  HENT:     Right Ear: External ear normal.     Left Ear: External ear normal.     Nose: Nose normal.     Mouth/Throat:     Mouth: Mucous membranes are moist.  Eyes:     Extraocular Movements: Extraocular movements intact.     Pupils: Pupils are equal, round, and reactive to light.  Cardiovascular:     Rate and Rhythm: Normal rate and regular rhythm.     Pulses: Normal pulses.     Heart sounds: No murmur heard. No friction rub.  Pulmonary:     Effort: Pulmonary effort is normal. No respiratory distress.     Breath sounds: No wheezing or rales.  Abdominal:     General: Bowel sounds are normal. There is no distension.     Tenderness: There is no abdominal tenderness. There is no guarding.  Musculoskeletal:        General: No swelling or deformity.     Cervical back: Normal range of motion.  Skin:  Comments: Right heel red, boggy, signs of prior breakdown, left heel sl red. Sacral stage II  Neurological:     Mental Status: He is alert.     Comments: Left inattention, Left sided field cut, loss of peripheral field on right also, poor visual acuity as a whole. Also with decreased hearing affecting overall output. Slow to process, easily frustrated but able to answer orientation questions with occasional cues--tends to perseverate at times. RUE 3/5. LUE 2+ to 3/5 with inconsistent effort. LE 2/5 HF, KE and 3/5 ADF/PF. Grossly senses pain and LT in all 4 but did not participate consistently.   Psychiatric:     Comments: Occasionally irritable but generally cooperative.     Results for orders placed or performed during the hospital encounter of 05/06/20 (from the past 48 hour(s))  Glucose, capillary     Status:  Abnormal   Collection Time: 05/11/20 12:53 PM  Result Value Ref Range   Glucose-Capillary 192 (H) 70 - 99 mg/dL    Comment: Glucose reference range applies only to samples taken after fasting for at least 8 hours.  Glucose, capillary     Status: Abnormal   Collection Time: 05/11/20  7:04 PM  Result Value Ref Range   Glucose-Capillary 218 (H) 70 - 99 mg/dL    Comment: Glucose reference range applies only to samples taken after fasting for at least 8 hours.  Glucose, capillary     Status: Abnormal   Collection Time: 05/11/20 11:53 PM  Result Value Ref Range   Glucose-Capillary 194 (H) 70 - 99 mg/dL    Comment: Glucose reference range applies only to samples taken after fasting for at least 8 hours.   Comment 1 Notify RN    Comment 2 Document in Chart   CBC     Status: Abnormal   Collection Time: 05/12/20  2:52 AM  Result Value Ref Range   WBC 4.7 4.0 - 10.5 K/uL   RBC 3.71 (L) 4.22 - 5.81 MIL/uL   Hemoglobin 11.6 (L) 13.0 - 17.0 g/dL   HCT 32.3 (L) 39.0 - 52.0 %   MCV 87.1 80.0 - 100.0 fL   MCH 31.3 26.0 - 34.0 pg   MCHC 35.9 30.0 - 36.0 g/dL   RDW 14.3 11.5 - 15.5 %   Platelets 119 (L) 150 - 400 K/uL    Comment: Immature Platelet Fraction may be clinically indicated, consider ordering this additional test [id number] CONSISTENT WITH PREVIOUS RESULT    nRBC 0.0 0.0 - 0.2 %    Comment: Performed at [facility], [place] [address], [place], [place] [address]  Basic metabolic panel     Status: Abnormal   Collection Time: 05/12/20  2:52 AM  Result Value Ref Range   Sodium 136 135 - 145 mmol/L   Potassium 3.9 3.5 - 5.1 mmol/L   Chloride 101 98 - 111 mmol/L   CO2 26 22 - 32 mmol/L   Glucose, Bld 205 (H) 70 - 99 mg/dL    Comment: Glucose reference range applies only to samples taken after fasting for at least 8 hours.   BUN 15 8 - 23 mg/dL   Creatinine, Ser 0.49 (L) 0.61 - 1.24 mg/dL   Calcium 8.6 (L) 8.9 - 10.3 mg/dL   GFR, Estimated >60 >60 mL/min    Comment:  (NOTE) Calculated using the CKD-EPI Creatinine Equation (2021)    Anion gap 9 5 - 15    Comment: Performed at [facility]  [address], [place], [place] [address]  Glucose, capillary     Status: Abnormal   Collection Time: 05/12/20  6:11 AM  Result Value Ref Range   Glucose-Capillary 188 (H) 70 - 99 mg/dL    Comment: Glucose reference range applies only to samples taken after fasting for at least 8 hours.   Comment 1 Notify RN    Comment 2 Document in Chart   Glucose, capillary     Status: Abnormal   Collection Time: 05/12/20 11:55 AM  Result Value Ref Range   Glucose-Capillary 131 (H) 70 - 99 mg/dL    Comment: Glucose reference range applies only to samples taken after fasting for at least 8 hours.  Glucose, capillary     Status: Abnormal   Collection Time: 05/12/20  5:37 PM  Result Value Ref Range   Glucose-Capillary 182 (H) 70 - 99 mg/dL    Comment: Glucose reference range applies only to samples taken after fasting for at least 8 hours.  Troponin I (High Sensitivity)     Status: Abnormal   Collection Time: 05/12/20  7:29 PM  Result Value Ref Range   Troponin I (High Sensitivity) 32 (H) <18 ng/L    Comment: (NOTE) Elevated high sensitivity troponin I (hsTnI) values and significant  changes across serial measurements may suggest ACS but many other  chronic and acute conditions are known to elevate hsTnI results.  Refer to the "Links" section for chest pain algorithms and additional  guidance. Performed at [facility], [place] [address], [place], [place] [address]   Troponin I (High Sensitivity)     Status: Abnormal   Collection Time: 05/12/20  9:33 PM  Result Value Ref Range   Troponin I (High Sensitivity) 26 (H) <18 ng/L    Comment: (NOTE) Elevated high sensitivity troponin I (hsTnI) values and significant  changes across serial measurements may suggest ACS but many other  chronic and acute conditions are known to elevate hsTnI results.  Refer to the  "Links" section for chest pain algorithms and additional  guidance. Performed at [facility], [place] [address] [place], [place] [address]   Glucose, capillary     Status: Abnormal   Collection Time: 05/13/20 12:12 AM  Result Value Ref Range   Glucose-Capillary 290 (H) 70 - 99 mg/dL    Comment: Glucose reference range applies only to samples taken after fasting for at least 8 hours.  Glucose, capillary     Status: Abnormal   Collection Time: 05/13/20  5:54 AM  Result Value Ref Range   Glucose-Capillary 227 (H) 70 - 99 mg/dL    Comment: Glucose reference range applies only to samples taken after fasting for at least 8 hours.   No results found.     Medical Problem List and Plan: 1.  Functional and mobility deficits secondary to debility/encephalopathy after recurrent seizures and associated respiratory failure. Pt with history of GBM/resection 06/2019 -patient may shower  -ELOS/Goals: 14-18 days, min to mod assist with PT, OT, SLP 2.  Antithrombotics: -DVT/anticoagulation:  Pharmaceutical: Heparin  -antiplatelet therapy: N/a 3. Pain Management: Tylenol prn 4. Mood: LCSW to follow for evaluation and support.   -antipsychotic agents: N/A 5. Neuropsych: This patient is not fully capable of making decisions on his own behalf. 6. Skin/Wound Care: Routine pressure relief measures.   -prevalon boots for bilateral heels  -encourage adequate PO intake 7. Fluids/Electrolytes/Nutrition: Monitor I/O. Check lytes in am.  8. HTN: Monitor BP tid--continue Norvasc and metoprolol 9. Steroid induced hyperglycemia:  Monitor BS ac/hs and use SSI for elevated BS. 10. Seizures: Has been seizure free on Keppra 2000 mg bid. Continue decadron bid and monitor for now.  11. Bouts of agitation: Have resolved-->change Pyridoxine to oral route. Continue to use ativan prn anxiety.  12. Hypoxia: Currently on 2 L per [place] (dropped to 80's on RA per tech) ---continue oxygen per [place].   -Will order CXR for  evaluation.  -IS,OOB, and wean oxygen as able.        Bary Leriche, PA-C 05/13/2020

## 2020-05-13 NOTE — Progress Notes (Signed)
Physical Therapy Treatment Patient Details Name: [name] MRN: [id number] DOB: 17-Jul-1945 Today's Date: 05/13/2020    History of Present Illness 75 y.o. male presenting with recurrent seizures in setting of glioblastoma now post-ictal. S/p ETT 1/17-1/19. Wife also reporting cognitive and physical decline over last several weeks. Patient recently admitted 12/21 with recurrent seizures. PMHx significant for glioblastoma s/p resection 3/21, Covid(+) 12/29, DM II, HTN.    PT Comments    Pt soiled upon arrival. Pt confused throughout session. Pt with decreased command following requiring max A for all bed mobility. Pt stating he was having pain during all functional mobility tasks but unable to give number or location. Pt with decreased balance during sitting. Pt continues to demonstrate deficits in balance, strength, coordination, safety and endurance and will benefit from skilled PT to address deficits to maximize independence with functional mobility prior to discharge.    Follow Up Recommendations  CIR;Supervision/Assistance - 24 hour     Equipment Recommendations  None recommended by PT    Recommendations for Other Services       Precautions / Restrictions Precautions Precautions: Fall Precaution Comments: Peripheral visual field cut L>R, seizures Restrictions Weight Bearing Restrictions: No    Mobility  Bed Mobility Overal bed mobility: Needs Assistance Bed Mobility: Rolling;Supine to Sit;Sit to Supine Rolling: Max assist   Supine to sit: Max assist Sit to supine: Max assist   General bed mobility comments: pt requiring max A for all be mobility, pt unable to pull up to sitting requirng therapist to lift pt; performed rolling mulitple times for hygiene due to being soiled, pt requiring hand over hand to reach of bedrails as well as max A to perform rolling  Transfers                    Ambulation/Gait                 Stairs              Wheelchair Mobility    Modified Rankin (Stroke Patients Only)       Balance Overall balance assessment: Needs assistance Sitting-balance support: Bilateral upper extremity supported;Feet supported Sitting balance-Leahy Scale: Poor Sitting balance - Comments: pt with increased posterior lean and unable to maintain wtihout support from therapist                                    Cognition Arousal/Alertness: Awake/alert Behavior During Therapy: Anxious Overall Cognitive Status: Impaired/Different from baseline Area of Impairment: Memory;Following commands;Safety/judgement;Awareness;Problem solving                 Orientation Level: Disoriented to;Time;Situation;Place Current Attention Level: Selective Memory: Decreased recall of precautions;Decreased short-term memory Following Commands: Follows one step commands inconsistently Safety/Judgement: Decreased awareness of safety;Decreased awareness of deficits Awareness: Intellectual Problem Solving: Slow processing;Decreased initiation;Difficulty sequencing;Requires tactile cues;Requires verbal cues        Exercises      General Comments        Pertinent Vitals/Pain Faces Pain Scale: Hurts little more Pain Location: Patient unable to localize. wtih all bed mobility pt stating it hurt but unable to give a number or location    Home Living                      Prior Function            PT Goals (current  goals can now be found in the care plan section) Acute Rehab PT Goals Patient Stated Goal: be more independent so I don't burden my wife PT Goal Formulation: With patient Time For Goal Achievement: 05/23/20 Potential to Achieve Goals: Fair Progress towards PT goals: Not progressing toward goals - comment (pt with decreased balance and decreased participation in all functional mobility tasks)    Frequency    Min 4X/week      PT Plan Current plan remains appropriate     Co-evaluation              AM-PAC PT "6 Clicks" Mobility   Outcome Measure  Help needed turning from your back to your side while in a flat bed without using bedrails?: A Lot Help needed moving from lying on your back to sitting on the side of a flat bed without using bedrails?: A Lot Help needed moving to and from a bed to a chair (including a wheelchair)?: Total Help needed standing up from a chair using your arms (e.g., wheelchair or bedside chair)?: Total Help needed to walk in hospital room?: Total Help needed climbing 3-5 steps with a railing? : Total 6 Click Score: 8    End of Session   Activity Tolerance: Patient limited by pain Patient left: in bed;with call bell/phone within reach;with bed alarm set Nurse Communication: Mobility status;Need for lift equipment PT Visit Diagnosis: Muscle weakness (generalized) (M62.81);Difficulty in walking, not elsewhere classified (R26.2)     Time: 5400-8676 PT Time Calculation (min) (ACUTE ONLY): 40 min  Charges:  $Therapeutic Activity: 23-37 mins                     Lyanne Co, DPT Acute Rehabilitation Services [id number]   [place] 05/13/2020, 11:31 AM

## 2020-05-13 NOTE — Discharge Summary (Signed)
Physician Discharge Summary  OWYNN THWAITES [id number] DOB: [birth year] DOA: 05/06/2020  PCP: Shirlean Mylar, MD  Admit date: 05/06/2020 Discharge date: 05/13/2020  Admitted From: Home Disposition:  Discharged to CIR  Recommendations for Outpatient Follow-up:  1. Follow up with PCP in 1-2 weeks 2. Please obtain BMP/CBC in one week 3. Follow up with neurology in 1 - 2 weeks  Discharge Condition: Stable  CODE STATUS: FULL   Brief/Interim Summary: 75 year old male with glioblastoma s/p resection 06/2019. 1/17 Presents to ED with reported seizures, wife reports noting seizure activity patient then fell out of bed but did not hit head. After EMS arrived patient had 2 additional seizures. On arrival to ED remained unresponsive with GCS 3 requiring intubation. CT head with no acute, noted residual masslike density in the right occipital lobe with similar appearing mass effect with 4-5 mm midline shift. Given 1 gram of keppra and 4 mg decadron. Critical Care Consulted for admission.   Recent admission 03/2020 with recurrent seizures. Keppra increased to 1500 BID at this time. Reportedly over last year has declined physically as well with cognition and memory, needing increased amount of help with ADLS. Failed Chemo, last Nov 2021, Salvage Therapy planned to start. On 12/29 patient tested positive for COVID. On 12/31 received casirivimab\imdevimab infusion per oncology.  1/24: He is doing well this morning. He is stable for transfer to CIR.  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     - followed by Dr. Barbaraann Cao, failed chemotherapy, s/p resection 06/2019, recently offered salvage therapy vs hospice.Family wanting to continue treatments  at this time.      - 1/24: continue current regimen; follow up with neurology  Acute hypoxic respiratory failure due to status epilepticus     - resolved, extubated 05/08/2020  COVID + on 12/29      - resolved     - Pt no longer needs quarantine per CDC guidelines; isolation d/c'd 1/19     - completed steroid therapy  HTN Sinus Tachycardia      - continue metoprolol and Norvasc     - 1/24: BP and HR are good, continue current regimen  DM2     - Not on a home regimen      - SSI, change to DM diet, glucose checks     - 1/24: discharging to CIR today, continue SSI, DM diet there.   Discharge Instructions   Allergies as of 05/13/2020      Reactions   Other Other (See Comments)   THE PATIENT'S SKIN HAS BECOME VERY THIN- BRUISES AND TEARS VERY EASILY!!   Tape Other (See Comments)   THE PATIENT'S SKIN HAS BECOME VERY THIN- BRUISES AND TEARS VERY EASILY!!   Lisinopril Cough      Medication List    STOP taking these medications   LORazepam 0.5 MG tablet Commonly known as: ATIVAN     TAKE these medications   amLODipine 10 MG tablet Commonly known as: NORVASC TAKE 1 TABLET(10 MG) BY MOUTH EVERY EVENING What changed:   how much to take  how to take this  when to take this  additional instructions   atorvastatin 80 MG tablet Commonly known as: LIPITOR TAKE 1 TABLET(80 MG) BY MOUTH AT BEDTIME What changed: See the new instructions.  calcium citrate 950 (200 Ca) MG tablet Commonly known as: CALCITRATE - dosed in mg elemental calcium Take 1 tablet (200 mg of elemental calcium total) by mouth daily. What changed: when to take this   dexamethasone 2 MG tablet Commonly known as: DECADRON Take 1-2 tablets (2-4 mg total) by mouth See admin instructions. Take 2 mg by mouth in the morning and 4 mg in the evening   levETIRAcetam 1000 MG tablet Commonly known as: KEPPRA Take 2 tablets (2,000 mg total) by mouth 2 (two) times daily. What changed:   medication  strength  how much to take   metoprolol succinate 25 MG 24 hr tablet Commonly known as: TOPROL-XL TAKE 1/2 TABLET(12.5 MG) BY MOUTH EVERY EVENING What changed: See the new instructions.   pyridOXINE 100 MG tablet Commonly known as: VITAMIN B-6 Take 1 tablet (100 mg total) by mouth daily.       Allergies  Allergen Reactions  . Other Other (See Comments)    THE PATIENT'S SKIN HAS BECOME VERY THIN- BRUISES AND TEARS VERY EASILY!!  . Tape Other (See Comments)    THE PATIENT'S SKIN HAS BECOME VERY THIN- BRUISES AND TEARS VERY EASILY!!  . Lisinopril Cough    Consultations:  Neurology  PCCM  Wound Care: Pressure Injury 05/07/20 Sacrum Mid Stage 2 -  Partial thickness loss of dermis presenting as a shallow open injury with a red, pink wound bed without slough. (Active)  05/07/20 0132  Location: Sacrum  Location Orientation: Mid  Staging: Stage 2 -  Partial thickness loss of dermis presenting as a shallow open injury with a red, pink wound bed without slough.  Wound Description (Comments):   Present on Admission: Yes   Procedures/Studies: DG Chest 1 View  Result Date: 05/06/2020 CLINICAL DATA:  Respiratory failure. EXAM: CHEST  1 VIEW COMPARISON:  March 16, 2020. FINDINGS: Stable cardiomediastinal silhouette. Endotracheal and nasogastric tubes appear to be in good position. No pneumothorax is noted. Right lung is clear. Status post coronary bypass graft. Mild left basilar atelectasis or infiltrate is noted with small pleural effusion. Bony thorax is unremarkable. IMPRESSION: Mild left basilar atelectasis or infiltrate is noted with small pleural effusion. Stable support apparatus. No pneumothorax is noted. Electronically Signed   By: Marijo Conception M.D.   On: 05/06/2020 14:51   CT Head Wo Contrast  Result Date: 05/06/2020 CLINICAL DATA:  Mental status change EXAM: CT HEAD WITHOUT CONTRAST TECHNIQUE: Contiguous axial images were obtained from the base of the skull through the  vertex without intravenous contrast. COMPARISON:  CT 03/16/2020, MRI 03/13/2020, CT 10/08/2019, 07/01/2019 FINDINGS: Brain: No acute intracranial hemorrhage. Residual masslike density within the right occipital lobe with soft tissue density in the region of splenium of corpus callosum. Progressive gyriform calcification at the right occipital lobe. Extensive hypodensity within the right temporal lobe, occipital lobe, and parietal lobe grossly similar as compared to prior. Mass effect on the right lateral ventricle without significant change. About 4-5 mm midline shift to the left, also grossly similar. Vascular: No hyperdense vessels.  Carotid vascular calcification. Skull: Previous right posterior craniotomy.  No fracture Sinuses/Orbits: Mucosal thickening in the ethmoid and maxillary sinuses. Other: None IMPRESSION: 1. Grossly similar appearance of the brain since head CT 03/16/2020. Residual masslike density in the right occipital lobe, likely involving splenium of corpus callosum. Similar surrounding edema and regional mass effect with about 4-5 mm midline shift to the left. No acute intracranial hemorrhage. Electronically Signed   By: Madie Reno.D.  On: 05/06/2020 17:42   EEG adult  Result Date: 05/07/2020 Lora Havens, MD     05/07/2020 10:35 AM Patient Name: MALYKI LECHTENBERG MRN: [id number] Epilepsy Attending: Lora Havens Referring Physician/Provider: Dr. Lynnae Sandhoff Date: 05/07/2020 Duration: 23.49 mins Patient history: 75 year old male with known glioma status postresection, epilepsy who presented with breakthrough seizure. EEG to evaluate for seizures. Level of alertness: Lethargic AEDs during EEG study: LEV, propofol Technical aspects: This EEG study was done with scalp electrodes positioned according to the 10-20 International system of electrode placement. Electrical activity was acquired at a sampling rate of 500Hz  and reviewed with a high frequency filter of 70Hz  and a low  frequency filter of 1Hz . EEG data were recorded continuously and digitally stored. Description: No clear posterior dominant rhythm was seen. EEG showed continuous generalized 3 to 5 Hz theta-delta delta slowing admixed with 15 to 18 Hz beta activity.  Sharp waves were also seen in right temporoparietal region, maximal T8 >P4. Hyperventilation and photic stimulation were not performed.   ABNORMALITY -Sharp waves, right temporoparietal region -Continuous slow, generalized IMPRESSION: This study showed evidence of epileptogenicity arising from right temporoparietal region.  Additionally there is also moderate diffuse encephalopathy, nonspecific etiology. No seizures were seen throughout the recording. Priyanka Barbra Sarks   Overnight EEG with video  Result Date: 05/09/2020 Lora Havens, MD     05/09/2020  2:14 PM Patient Name: JAMARION RIEKER MRN: [id number] Epilepsy Attending: Lora Havens Referring Physician/Provider: Dr. Kathrynn Speed Duration:  05/08/2020 1223 to 05/09/2020 1335  Patient history: 75 year old male with known glioma status postresection, epilepsy who presented with breakthrough seizure. EEG to evaluate for seizures.  Level of alertness: awake  AEDs during EEG study: LEV  Technical aspects: This EEG study was done with scalp electrodes positioned according to the 10-20 International system of electrode placement. Electrical activity was acquired at a sampling rate of 500Hz  and reviewed with a high frequency filter of 70Hz  and a low frequency filter of 1Hz . EEG data were recorded continuously and digitally stored.  Description: No clear posterior dominant rhythm was seen. EEG showed continuous generalized 3 to 5 Hz theta-delta delta slowing admixed with 15 to 18 Hz beta activity.  Sharp waves were also seen in right temporoparietal region, maximal T8 >P4. Hyperventilation and photic stimulation were not performed.    ABNORMALITY -Sharp waves, right temporoparietal region -Continuous  slow, generalized  IMPRESSION: This study showed evidence of epileptogenicity arising from right temporoparietal region. Additionally there is also moderate diffuse encephalopathy, nonspecific etiology. No seizures were seen throughout the recording.   Priyanka Barbra Sarks      Subjective: "I'm ok, I guess."  Discharge Exam: Vitals:   05/13/20 0400 05/13/20 0825  BP: 133/80 (!) 117/58  Pulse: 87 70  Resp: 20 18  Temp: 98.8 F (37.1 C) 97.9 F (36.6 C)  SpO2: 100% 95%   Vitals:   05/13/20 0000 05/13/20 0400 05/13/20 0500 05/13/20 0825  BP: 133/84 133/80  (!) 117/58  Pulse: 77 87  70  Resp: 20 20  18   Temp: 98.4 F (36.9 C) 98.8 F (37.1 C)  97.9 F (36.6 C)  TempSrc: Oral Axillary  Oral  SpO2: 97% 100%  95%  Weight:   96 kg   Height:        General: 75 y.o. male resting in bed in NAD Eyes: PERRL, normal sclera ENMT: Nares patent w/o discharge, orophaynx clear, dentition normal, ears w/o discharge/lesions/ulcers Neck: Supple, trachea midline Cardiovascular: RRR, +  S1, S2, no m/g/r, equal pulses throughout Respiratory: CTABL, no w/r/r, normal WOB GI: BS+, NDNT, no masses noted, no organomegaly noted MSK: No e/c/c Neuro: A&O x 2, no focal deficits Psyc: Appropriate interaction and flat affect, calm/cooperative   The results of significant diagnostics from this hospitalization (including imaging, microbiology, ancillary and laboratory) are listed below for reference.     Microbiology: Recent Results (from the past 240 hour(s))  Resp Panel by RT-PCR (Flu A&B, Covid) Nasopharyngeal Swab     Status: Abnormal   Collection Time: 05/06/20  2:07 PM   Specimen: Nasopharyngeal Swab; Nasopharyngeal(NP) swabs in vial transport medium  Result Value Ref Range Status   SARS Coronavirus 2 by RT PCR POSITIVE (A) NEGATIVE Final    Comment: emailed L. Berdik RN 16:10 05/06/20 ([email or web address]) (NOTE) SARS-CoV-2 target nucleic acids are DETECTED.  The SARS-CoV-2 RNA is generally detectable  in upper respiratory specimens during the acute phase of infection. Positive results are indicative of the presence of the identified virus, but do not rule out bacterial infection or co-infection with other pathogens not detected by the test. Clinical correlation with patient history and other diagnostic information is necessary to determine patient infection status. The expected result is Negative.  Fact Sheet for Patients: EntrepreneurPulse.com.au  Fact Sheet for Healthcare Providers: IncredibleEmployment.be  This test is not yet approved or cleared by the [place] FDA and  has been authorized for detection and/or diagnosis of SARS-CoV-2 by FDA under an Emergency Use Authorization (EUA).  This EUA will remain in effect (meaning this test can be used) for the duration of  the COVID-1 9 declaration under Section 564(b)(1) of the Act, 21 U.S.C. section 360bbb-3(b)(1), unless the authorization is terminated or revoked sooner.     Influenza A by PCR NEGATIVE NEGATIVE Final   Influenza B by PCR NEGATIVE NEGATIVE Final    Comment: (NOTE) The Xpert Xpress SARS-CoV-2/FLU/RSV plus assay is intended as an aid in the diagnosis of influenza from Nasopharyngeal swab specimens and should not be used as a sole basis for treatment. Nasal washings and aspirates are unacceptable for Xpert Xpress SARS-CoV-2/FLU/RSV testing.  Fact Sheet for Patients: EntrepreneurPulse.com.au  Fact Sheet for Healthcare Providers: IncredibleEmployment.be  This test is not yet approved or cleared by the [place] FDA and has been authorized for detection and/or diagnosis of SARS-CoV-2 by FDA under an Emergency Use Authorization (EUA). This EUA will remain in effect (meaning this test can be used) for the duration of the COVID-19 declaration under Section 564(b)(1) of the Act, 21 U.S.C. section 360bbb-3(b)(1), unless the authorization  is terminated or revoked.  Performed at [facility], [place] [address] [place], [place] [address]   Culture, blood (routine x 2)     Status: None   Collection Time: 05/06/20  4:25 PM   Specimen: BLOOD LEFT HAND  Result Value Ref Range Status   Specimen Description BLOOD LEFT HAND  Final   Special Requests   Final    BOTTLES DRAWN AEROBIC ONLY Blood Culture results may not be optimal due to an inadequate volume of blood received in culture bottles   Culture   Final    NO GROWTH 5 DAYS Performed at [facility], [place] [address] [place], [place] [address]    Report Status 05/11/2020 FINAL  Final  Culture, blood (routine x 2)     Status: None   Collection Time: 05/06/20  9:10 PM   Specimen: BLOOD RIGHT HAND  Result Value Ref Range Status  Specimen Description BLOOD RIGHT HAND  Final   Special Requests   Final    BOTTLES DRAWN AEROBIC AND ANAEROBIC Blood Culture adequate volume   Culture   Final    NO GROWTH 5 DAYS Performed at [facility], [address], [place], [place] [address]    Report Status 05/11/2020 FINAL  Final  MRSA PCR Screening     Status: None   Collection Time: 05/07/20  1:25 AM   Specimen: Nasopharyngeal  Result Value Ref Range Status   MRSA by PCR NEGATIVE NEGATIVE Final    Comment:        The GeneXpert MRSA Assay (FDA approved for NASAL specimens only), is one component of a comprehensive MRSA colonization surveillance program. It is not intended to diagnose MRSA infection nor to guide or monitor treatment for MRSA infections. Performed at [facility], [place] [address] [place], [place] [address]      Labs: BNP (last 3 results) No results for input(s): BNP in the last 8760 hours. Basic Metabolic Panel: Recent Labs  Lab 05/06/20 1353 05/06/20 1621 05/06/20 1924 05/07/20 0320 05/08/20 0401 05/12/20 0252  NA 136 135  --  138 136 136  K 4.4 3.9  --  3.8 3.7 3.9  CL 100  --   --  103 103 101  CO2 20*  --   --  23 23 26    GLUCOSE 377*  --   --  217* 117* 205*  BUN 24*  --   --  20 19 15   CREATININE 0.89  --   --  0.66 0.59* 0.49*  CALCIUM 9.1  --   --  8.6* 8.5* 8.6*  MG  --   --  1.6* 1.6*  --   --   PHOS  --   --  3.8 3.5  --   --    Liver Function Tests: Recent Labs  Lab 05/06/20 1353 05/07/20 0320  AST 31 20  ALT 29 23  ALKPHOS 85 67  BILITOT 1.4* 1.1  PROT 6.3* 5.1*  ALBUMIN 3.5 2.9*   No results for input(s): LIPASE, AMYLASE in the last 168 hours. No results for input(s): AMMONIA in the last 168 hours. CBC: Recent Labs  Lab 05/06/20 1353 05/06/20 1621 05/07/20 0320 05/12/20 0252  WBC 5.8  --  5.5 4.7  NEUTROABS 4.3  --   --   --   HGB 13.9 12.2* 12.8* 11.6*  HCT 43.3 36.0* 36.3* 32.3*  MCV 92.1  --  87.7 87.1  PLT 155  --  117* 119*   Cardiac Enzymes: No results for input(s): CKTOTAL, CKMB, CKMBINDEX, TROPONINI in the last 168 hours. BNP: Invalid input(s): POCBNP CBG: Recent Labs  Lab 05/12/20 0611 05/12/20 1155 05/12/20 1737 05/13/20 0012 05/13/20 0554  GLUCAP 188* 131* 182* 290* 227*   D-Dimer No results for input(s): DDIMER in the last 72 hours. Hgb A1c No results for input(s): HGBA1C in the last 72 hours. Lipid Profile No results for input(s): CHOL, HDL, LDLCALC, TRIG, CHOLHDL, LDLDIRECT in the last 72 hours. Thyroid function studies No results for input(s): TSH, T4TOTAL, T3FREE, THYROIDAB in the last 72 hours.  Invalid input(s): FREET3 Anemia work up No results for input(s): VITAMINB12, FOLATE, FERRITIN, TIBC, IRON, RETICCTPCT in the last 72 hours. Urinalysis    Component Value Date/Time   COLORURINE YELLOW 05/06/2020 1408   APPEARANCEUR HAZY (A) 05/06/2020 1408   LABSPEC 1.032 (H) 05/06/2020 1408   PHURINE 5.0 05/06/2020 1408  GLUCOSEU >=500 (A) 05/06/2020 1408   HGBUR NEGATIVE 05/06/2020 1408   BILIRUBINUR NEGATIVE 05/06/2020 1408   KETONESUR NEGATIVE 05/06/2020 1408   PROTEINUR 100 (A) 05/06/2020 1408   UROBILINOGEN 2.0 (H) 08/28/2019 1902    NITRITE NEGATIVE 05/06/2020 1408   LEUKOCYTESUR NEGATIVE 05/06/2020 1408   Sepsis Labs Invalid input(s): PROCALCITONIN,  WBC,  LACTICIDVEN Microbiology Recent Results (from the past 240 hour(s))  Resp Panel by RT-PCR (Flu A&B, Covid) Nasopharyngeal Swab     Status: Abnormal   Collection Time: 05/06/20  2:07 PM   Specimen: Nasopharyngeal Swab; Nasopharyngeal(NP) swabs in vial transport medium  Result Value Ref Range Status   SARS Coronavirus 2 by RT PCR POSITIVE (A) NEGATIVE Final    Comment: emailed L. Berdik RN 16:10 05/06/20 ([email or web address]) (NOTE) SARS-CoV-2 target nucleic acids are DETECTED.  The SARS-CoV-2 RNA is generally detectable in upper respiratory specimens during the acute phase of infection. Positive results are indicative of the presence of the identified virus, but do not rule out bacterial infection or co-infection with other pathogens not detected by the test. Clinical correlation with patient history and other diagnostic information is necessary to determine patient infection status. The expected result is Negative.  Fact Sheet for Patients: EntrepreneurPulse.com.au  Fact Sheet for Healthcare Providers: IncredibleEmployment.be  This test is not yet approved or cleared by the [place] FDA and  has been authorized for detection and/or diagnosis of SARS-CoV-2 by FDA under an Emergency Use Authorization (EUA).  This EUA will remain in effect (meaning this test can be used) for the duration of  the COVID-1 9 declaration under Section 564(b)(1) of the Act, 21 U.S.C. section 360bbb-3(b)(1), unless the authorization is terminated or revoked sooner.     Influenza A by PCR NEGATIVE NEGATIVE Final   Influenza B by PCR NEGATIVE NEGATIVE Final    Comment: (NOTE) The Xpert Xpress SARS-CoV-2/FLU/RSV plus assay is intended as an aid in the diagnosis of influenza from Nasopharyngeal swab specimens and should not be used as a sole basis for  treatment. Nasal washings and aspirates are unacceptable for Xpert Xpress SARS-CoV-2/FLU/RSV testing.  Fact Sheet for Patients: EntrepreneurPulse.com.au  Fact Sheet for Healthcare Providers: IncredibleEmployment.be  This test is not yet approved or cleared by the [place] FDA and has been authorized for detection and/or diagnosis of SARS-CoV-2 by FDA under an Emergency Use Authorization (EUA). This EUA will remain in effect (meaning this test can be used) for the duration of the COVID-19 declaration under Section 564(b)(1) of the Act, 21 U.S.C. section 360bbb-3(b)(1), unless the authorization is terminated or revoked.  Performed at [facility], [place] [address], [place], [place] [address]   Culture, blood (routine x 2)     Status: None   Collection Time: 05/06/20  4:25 PM   Specimen: BLOOD LEFT HAND  Result Value Ref Range Status   Specimen Description BLOOD LEFT HAND  Final   Special Requests   Final    BOTTLES DRAWN AEROBIC ONLY Blood Culture results may not be optimal due to an inadequate volume of blood received in culture bottles   Culture   Final    NO GROWTH 5 DAYS Performed at [facility], [place] [address], [place], [place] [address]    Report Status 05/11/2020 FINAL  Final  Culture, blood (routine x 2)     Status: None   Collection Time: 05/06/20  9:10 PM   Specimen: BLOOD RIGHT HAND  Result Value Ref Range Status   Specimen Description  BLOOD RIGHT HAND  Final   Special Requests   Final    BOTTLES DRAWN AEROBIC AND ANAEROBIC Blood Culture adequate volume   Culture   Final    NO GROWTH 5 DAYS Performed at [facility], [address], [place], [place] [address]    Report Status 05/11/2020 FINAL  Final  MRSA PCR Screening     Status: None   Collection Time: 05/07/20  1:25 AM   Specimen: Nasopharyngeal  Result Value Ref Range Status   MRSA by PCR NEGATIVE NEGATIVE Final    Comment:        The GeneXpert  MRSA Assay (FDA approved for NASAL specimens only), is one component of a comprehensive MRSA colonization surveillance program. It is not intended to diagnose MRSA infection nor to guide or monitor treatment for MRSA infections. Performed at [facility], [place] [address] [place],  [address]      Time coordinating discharge: 40 minutes  SIGNED:   Jonnie Finner, DO  [facility] 05/13/2020, 11:11 AM   If 7PM-7AM, please contact night-coverage [email or web address]

## 2020-05-13 NOTE — Progress Notes (Signed)
Signed        PMR Admission Coordinator Pre-Admission Assessment   Patient: [name] is an 75 y.o., male MRN: [id number] DOB: 01/21/1946 Height: 5\' 11"  (180.3 cm) Weight: 96 kg   Insurance Information HMO:     PPO:      PCP:      IPA:      80/20: yes     OTHER:   PRIMARY: Medicare A & B      Policy#: [id number]      Subscriber: patient CM Name:       Phone#:      Fax#:  Pre-Cert#:       Employer:  Benefits:  Phone #: verified online via [employer] on 05/12/20     Name:  Eff. Date: Medicare Part A & B effective 12/20/10     Deduct: $1,556      Out of Pocket Max: NA      Life Max: NA CIR: 100% with Medicare approval      SNF: 100% days 1-20, 80% days 21-100 Outpatient: 80%     Co-Pay: 20% Home Health: 100%      Co-Pay:  DME: 80%     Co-Pay: 20% Providers: pt's choice  SECONDARY: A[employer]      Policy#: [id number]     Phone#: [phone]   Financial Counselor:       Phone#:    The "Data Collection Information Summary" for patients in Inpatient Rehabilitation Facilities with attached "Privacy Act [place] Records" was provided and verbally reviewed with: Patient and Family   Emergency Contact Information         Contact Information     Name Relation Home Work Mobile    [place] Spouse [phone]   [phone]         Current Medical History  Patient Admitting Diagnosis: debility s/p seizures, COVID-19   History of Present Illness: Pt is a 75 year old male with medical history significant for: glioblastoma s/p resection 06/2019, DM II, HTN, COVID + 04/17/20, recent admission on 03/2020 for recurrent seizures.  Pt presented to ED on 05/06/20 d/t reported seizures at home.  Wife witnessed seizure activity and noted pt fell out of bed but did not hit head.  After EMS arrived, pt had 2 additional seizures. Upon arrival to ED, pt remained unresponsive and was intubated.  CT of head noted no acute findings, but noted residual masslike density in right occipital  lobe with similar appearing mass effect with 4-5 mm midline shift.   Reportedly pt has declined physically and cognitively, requiring increased amount of help with ADLS.  Failed Chemo, last Nov 2021, Salvage Therapy planned to begin.  Therapy evaluations completed and CIR recommended d/t pt deficits in balance, functional mobility, strength and cognition.   Complete NIHSS TOTAL: 3   Patient's medical record from [facility] has been reviewed by the rehabilitation admission coordinator and physician.   Past Medical History      Past Medical History:  Diagnosis Date  . Anxiety    . Arthritis    . ASCVD (arteriosclerotic cardiovascular disease)      03/2004: DES x2 to the RCA; IMI with stent occlusion in 02/2005- suboptimal Plavix compliance  . CAD (coronary artery disease)    . Carcinoma of prostate ([place])      Clopidogrel held for biopsy  . Constipation due to pain medication    . Diabetes mellitus without complication ([place])    . Diabetic neuropathy ([place])    .  Headache 2021  . Hyperlipidemia    . Hypertension    . Mild depression ([place])    . Morbid obesity ([place])    . Myocardial infarction ([place])    . Shortness of breath dyspnea    . Sleep apnea      no longer uses cpap  . Stroke ([place]) 2017  . Tobacco abuse      stopped smoking 06/28/09      Family History   family history includes Breast cancer in his sister; Kidney disease in his mother; Stroke in his father.   Prior Rehab/Hospitalizations Has the patient had prior rehab or hospitalizations prior to admission? Yes   Has the patient had major surgery during 100 days prior to admission? No               Current Medications   Current Facility-Administered Medications:  .  amLODipine (NORVASC) tablet 10 mg, 10 mg, Oral, QPM, Agarwala, Ravi, MD, 10 mg at 05/12/20 1712 .  atorvastatin (LIPITOR) tablet 80 mg, 80 mg, Oral, QHS, Agarwala, Ravi, MD, 80 mg at 05/12/20 2207 .  calcium citrate (CALCITRATE - dosed in mg  elemental calcium) tablet 200 mg of elemental calcium, 200 mg of elemental calcium, Oral, Daily, Agarwala, Ravi, MD, 200 mg of elemental calcium at 05/12/20 1101 .  chlorhexidine gluconate (MEDLINE KIT) (PERIDEX) 0.12 % solution 15 mL, 15 mL, Mouth Rinse, BID, Agarwala, Ravi, MD, 15 mL at 05/12/20 2206 .  Chlorhexidine Gluconate Cloth 2 % PADS 6 each, 6 each, Topical, Daily, Kipp Brood, MD, 6 each at 05/10/20 0933 .  dexamethasone (DECADRON) tablet 2 mg, 2 mg, Oral, Daily, Agarwala, Ravi, MD, 2 mg at 05/12/20 1101 .  dexamethasone (DECADRON) tablet 4 mg, 4 mg, Oral, QPM, Agarwala, Ravi, MD, 4 mg at 05/12/20 1712 .  fentaNYL (SUBLIMAZE) injection 12.5 mcg, 12.5 mcg, Intravenous, Q15 min PRN, Agarwala, Ravi, MD, 12.5 mcg at 05/10/20 1519 .  heparin injection 5,000 Units, 5,000 Units, Subcutaneous, Q8H, Agarwala, Ravi, MD, 5,000 Units at 05/13/20 0555 .  insulin aspart (novoLOG) injection 0-9 Units, 0-9 Units, Subcutaneous, Q6H, Kipp Brood, MD, 3 Units at 05/13/20 0555 .  lactated ringers infusion, , Intravenous, Continuous, Kipp Brood, MD, Stopped at 05/09/20 1257 .  levETIRAcetam (KEPPRA) tablet 2,000 mg, 2,000 mg, Oral, BID, Agarwala, Ravi, MD, 2,000 mg at 05/12/20 2206 .  LORazepam (ATIVAN) injection 1 mg, 1 mg, Intravenous, Q4H PRN, Kipp Brood, MD, 1 mg at 05/13/20 0129 .  MEDLINE mouth rinse, 15 mL, Mouth Rinse, 10 times per day, Agarwala, Einar Grad, MD, 15 mL at 05/13/20 0556 .  metoprolol succinate (TOPROL-XL) 24 hr tablet 12.5 mg, 12.5 mg, Oral, QPM, Agarwala, Ravi, MD, 12.5 mg at 05/12/20 1712 .  metoprolol tartrate (LOPRESSOR) injection 5 mg, 5 mg, Intravenous, Q5 min PRN, Agarwala, Ravi, MD .  polyethylene glycol (MIRALAX / GLYCOLAX) packet 17 g, 17 g, Oral, Daily PRN, Little Ishikawa, MD .  pyridOXINE (B-6) injection 100 mg, 100 mg, Intravenous, Daily, Agarwala, Ravi, MD, 100 mg at 05/12/20 1101   Patients Current Diet:     Diet Order                      Diet regular  Room service appropriate? Yes; Fluid consistency: Thin  Diet effective now                      Precautions / Restrictions Precautions Precautions: Fall Precaution Comments: Peripheral visual field cut L>R,  seizures Restrictions Weight Bearing Restrictions: No    Has the patient had 2 or more falls or a fall with injury in the past year? No   Prior Activity Level Household: stays home   Prior Functional Level Self Care: Did the patient need help bathing, dressing, using the toilet or eating? Needed some help   Indoor Mobility: Did the patient need assistance with walking from room to room (with or without device)? Needed some help   Stairs: Did the patient need assistance with internal or external stairs (with or without device)? Dependent   Functional Cognition: Did the patient need help planning regular tasks such as shopping or remembering to take medications? Dependent   Home Assistive Devices / Equipment Home Equipment: Cane - single point,Walker - 2 wheels,Shower seat,Grab bars - tub/shower,Toilet riser   Prior Device Use: Indicate devices/aids used by the patient prior to current illness, exacerbation or injury? Walker and chair lift   Current Functional Level Cognition   Overall Cognitive Status: Impaired/Different from baseline Current Attention Level: Sustained Orientation Level: Oriented to person,Oriented to time Following Commands: Follows one step commands with increased time Safety/Judgement: Decreased awareness of safety,Decreased awareness of deficits General Comments: Patient only oriented to person. States that he is at home. Patient mistaking this therapist for his wife. Patient requires maximal multimodal and step-by-step instructions to sequence tasks assessed. Very poor attention to task requiring frequent redirection.    Extremity Assessment (includes Sensation/Coordination)   Upper Extremity Assessment: LUE deficits/detail LUE Deficits / Details:  Weakness throughout. Patient undershoots. Per chart review weakness 2/2 glioblastoma. LUE Sensation: WNL LUE Coordination: decreased fine motor,decreased gross motor  Lower Extremity Assessment: Defer to PT evaluation     ADLs   Overall ADL's : Needs assistance/impaired Grooming: Minimal assistance,Sitting Lower Body Dressing: Total assistance,Bed level Lower Body Dressing Details (indicate cue type and reason): Total A to don footwear seated EOB. Toilet Transfer: Total assistance,+2 for physical assistance,+2 for safety/equipment Toilet Transfer Details (indicate cue type and reason): Simulated with transfer to recliner and Stedy with Max A +2. Patient able to stand for <10 sec at a time.     Mobility   Overal bed mobility: Needs Assistance Bed Mobility: Sit to Supine Supine to sit: Mod assist General bed mobility comments: pt requires assist to elevate LEs onto bed, verbal cues for technique and hand placement     Transfers   Overall transfer level: Needs assistance Equipment used: Rolling walker (2 wheeled),1 person hand held assist Transfer via Lift Equipment: Stedy Transfers: Sit to/from [employer] to Stand: Max assist Squat pivot transfers: Max assist General transfer comment: pt requires PT cues for initiation as well as bilateral UE support or use of walker. PT provides knee block for suqat pivot transfer with use of drop arm recliner     Ambulation / Gait / Stairs / Wheelchair Mobility   Ambulation/Gait General Gait Details: nt     Posture / Balance Dynamic Sitting Balance Sitting balance - Comments: tendency for L lateral lean, minG-minA Balance Overall balance assessment: Needs assistance Sitting-balance support: Single extremity supported,Bilateral upper extremity supported,Feet supported Sitting balance-Leahy Scale: Poor Sitting balance - Comments: tendency for L lateral lean, minG-minA Postural control: Left lateral lean Standing balance  support: Bilateral upper extremity supported Standing balance-Leahy Scale: Poor Standing balance comment: maxA to maintain static standing with BUE support     Special needs/care consideration Continuous Drip IV  lactated ringers infusion: 71mL/hr, Skin Abrasion: abdomen, arm, hand/bilateral; Ecchymosis: abdomen/bilateral; Pressure  Injury: mid sacrum stage 2, Diabetic management novoLOG: 0-9 units every 6 hours, Bowel and Bladder incontinence, External urinary catheter and Designated visitor Joshuan Bolander, wife    Previous Home Environment (from acute therapy documentation) Living Arrangements: Spouse/significant other  Lives With: Spouse Available Help at Discharge: Family,Available 24 hours/day Type of Home: House Home Layout: Two level,Bed/bath upstairs Alternate Level Stairs-Rails: Right,Left,Can reach both Alternate Level Stairs-Number of Steps: has stair lift Home Access: Stairs to enter Entrance Stairs-Rails: Right,Left,Can reach both Entrance Stairs-Number of Steps: 3 Bathroom Shower/Tub: Walk-in shower,Other (comment) (wife gives sponge baths) Bathroom Toilet: Handicapped height (also uses urinal) Bathroom Accessibility: Yes How Accessible: Accessible via walker [place]: No   Discharge Living Setting Plans for Discharge Living Setting: Patient's home Type of Home at Discharge: House Discharge Home Layout: Two level,Bed/bath upstairs Alternate Level Stairs-Number of Steps: stair lift Discharge Home Access: Stairs to enter Entrance Stairs-Rails: Right,Left,Can reach both Entrance Stairs-Number of Steps: 3 Discharge Bathroom Shower/Tub: Walk-in shower,Other (comment) (wife gives sponge baths) Discharge Bathroom Toilet: Handicapped height Discharge Bathroom Accessibility: Yes How Accessible: Accessible via walker Does the patient have any problems obtaining your medications?: No   Social/Family/Support Systems Patient Roles: Spouse,Parent Anticipated  Caregiver: Axavier Pressley, wife and son Anticipated Caregiver's Contact Information: [name] [phone] Caregiver Availability: 24/7 Discharge Plan Discussed with Primary Caregiver: Yes Is Caregiver In Agreement with Plan?: Yes Does Caregiver/Family have Issues with Lodging/Transportation while Pt is in Rehab?: No   Goals Patient/Family Goal for Rehab: Min-Mod A: PT/OT Expected length of stay: 14--18 days Pt/Family Agrees to Admission and willing to participate: Yes Program Orientation Provided & Reviewed with Pt/Caregiver Including Roles  & Responsibilities: Yes   Decrease burden of Care through IP rehab admission: NA   Possible need for SNF placement upon discharge: NA   Patient Condition: I have reviewed medical records from [facility], spoken with [place], and patient and spouse. I met with patient at the bedside for inpatient rehabilitation assessment.  Patient will benefit from ongoing PT and OT, can actively participate in 3 hours of therapy a day 5 days of the week, and can make measurable gains during the admission.  Patient will also benefit from the coordinated team approach during an Inpatient Acute Rehabilitation admission.  The patient will receive intensive therapy as well as Rehabilitation physician, nursing, social worker, and care management interventions.  Due to bladder management, bowel management, safety, skin/wound care, disease management, medication administration, pain management and patient education the patient requires 24 hour a day rehabilitation nursing.  The patient is currently Mod-Max A with mobility and total A with basic ADLs.  Discharge setting and therapy post discharge at home with home health is anticipated.  Patient has agreed to participate in the Acute Inpatient Rehabilitation Program and will admit today.   Preadmission Screen Completed By:  Bethel Born, 05/13/2020 10:44  AM ______________________________________________________________________   Discussed status with Dr. Naaman Plummer on 05/13/20 at 10:44 AM  and received approval for admission today.   Admission Coordinator:  Bethel Born, CCC-SLP, time 10:44 AM/Date 05/13/20     Assessment/Plan: Diagnosis: debility related to seizures, COVID 19 1. Does the need for close, 24 hr/day Medical supervision in concert with the patient's rehab needs make it unreasonable for this patient to be served in a less intensive setting? Yes 2. Co-Morbidities requiring supervision/potential complications: DM, HTN, glioblastoma 3. Due to bladder management, bowel management, safety, skin/wound care, disease management, medication administration, pain management and patient education, does the  patient require 24 hr/day rehab nursing? Yes 4. Does the patient require coordinated care of a physician, rehab nurse, PT, OT, and SLP to address physical and functional deficits in the context of the above medical diagnosis(es)? Yes Addressing deficits in the following areas: balance, endurance, locomotion, strength, transferring, bowel/bladder control, bathing, dressing, feeding, grooming, toileting and psychosocial support 5. Can the patient actively participate in an intensive therapy program of at least 3 hrs of therapy 5 days a week? Yes 6. The potential for patient to make measurable gains while on inpatient rehab is excellent 7. Anticipated functional outcomes upon discharge from inpatient rehab: min assist and mod assist PT, min assist and mod assist OT, n/a SLP 8. Estimated rehab length of stay to reach the above functional goals is: 14-18 days 9. Anticipated discharge destination: Home 10. Overall Rehab/Functional Prognosis: excellent     MD Signature: Meredith Staggers, MD, [facility] 05/13/2020

## 2020-05-13 NOTE — TOC Transition Note (Signed)
Transition of Care [facility]) - CM/SW Discharge Note   Patient Details  Name: [name] MRN: [id number] Date of Birth: 1945-12-17  [facility]) CM/SW Contact:  Pollie Friar, RN Phone Number: 05/13/2020, 1:50 PM   Clinical Narrative:    Pt is discharging to CIR today. CM signing off.   Final next level of care: IP Rehab Facility Barriers to Discharge: No Barriers Identified   Patient Goals and CMS Choice        Discharge Placement                       Discharge Plan and Services                                     Social Determinants of Health (SDOH) Interventions     Readmission Risk Interventions No flowsheet data found.

## 2020-05-13 NOTE — Progress Notes (Signed)
Inpatient Rehabilitation Medication Review by a Pharmacist  A complete drug regimen review was completed for this patient to identify any potential clinically significant medication issues.  Clinically significant medication issues were identified:  no  Check AMION for pharmacist assigned to patient if future medication questions/issues arise during this admission.  Pharmacist comments:   Time spent performing this drug regimen review (minutes):  5   [place] 05/13/2020 3:29 PM

## 2020-05-13 NOTE — Progress Notes (Signed)
Patient arrived on unit, oriented to unit. Reviewed medications, therapy schedule, rehab routine and plan of care. States an understanding of information reviewed. No complications noted at this time. Patient reports no pain and is AX1 °[name] L [name]  °

## 2020-05-13 NOTE — H&P (Signed)
Physical Medicine and Rehabilitation Admission H&P        Chief Complaint  Patient presents with  . Debility      HPI: [name] is a 75 year old male with history of ASCVD,T2DM, prostate cancer s/p prostatectomy, GBM s/p RSXN 06/2019, Covid 19 04/17/20, seizures who was admitted on 05/06/20 with fall due to recurrent seizures. Patient has failed chemo and salvage therapy was in process of being started. CT head showed residual masslike density right occipital lobe with similar edema and mass effect c/w 11/20201. GCS 3 at admission and he was loaded with Keppra  Head and started on IV decadron. 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 He tolerated extubation without difficulty on 01/19 but had bouts of agitation. LT-EEG showed no further seizures but wife reported increase in agitation with increase in Keppra dose 12/21. Dr. Hortense Ramal recommended addition of Pyridoxine 100 mg daily for possible Keppra related agitation. Lethargy has resolved but he continues to be limited by weakness requiring increased level of assistance with basic ADL as well as Stedy lift with standing attempts.  CIR was recommended due to functional decline.       Review of Systems  Constitutional: Negative for chills and fever.  HENT: Positive for hearing loss.   Eyes: Negative for double vision.       Left HH due to tumor  Respiratory: Positive for shortness of breath (has required oxygen since last night). Negative for cough.   Cardiovascular: Negative for chest pain.  Gastrointestinal: Negative for constipation, heartburn and nausea.  Genitourinary: Negative for dysuria and frequency.  Musculoskeletal: Negative for back pain and myalgias.  Skin: Negative for rash.  Neurological: Positive for weakness.  Psychiatric/Behavioral: Positive for memory loss.            Past Medical History:  Diagnosis  Date  . Anxiety    . Arthritis    . ASCVD (arteriosclerotic cardiovascular disease)      03/2004: DES x2 to the RCA; IMI with stent occlusion in 02/2005- suboptimal Plavix compliance  . CAD (coronary artery disease)    . Carcinoma of prostate ([place])      Clopidogrel held for biopsy  . Constipation due to pain medication    . Diabetes mellitus without complication ([place])    . Diabetic neuropathy ([place])    . Headache 2021  . Hyperlipidemia    . Hypertension    . Mild depression ([place])    . Morbid obesity ([place])    . Myocardial infarction ([place])    . Shortness of breath dyspnea    . Sleep apnea      no longer uses cpap  . Stroke ([place]) 2017  . Tobacco abuse      stopped smoking 06/28/09           Past Surgical History:  Procedure Laterality Date  . APPLICATION OF CRANIAL NAVIGATION N/A 07/04/2019    Procedure: APPLICATION OF CRANIAL NAVIGATION;  Surgeon: Ashok Pall, MD;  Location: [facility];  Service: Neurosurgery;  Laterality: N/A;  . BACK SURGERY      . CARDIAC CATHETERIZATION        X 1 stent before having CABG  . CORONARY ARTERY BYPASS GRAFT   06/28/2008    X4  . CRANIOTOMY Right 07/04/2019    Procedure: Right occipital craniotomy for tumor with brainlab;  Surgeon: Ashok Pall, MD;  Location: [facility]  OR;  Service: Neurosurgery;  Laterality: Right;  . ENDARTERECTOMY Right 07/03/2015    Procedure: RIGHT CAROTID ENDARTERECTOMY WITH BOVINE PERICARDIUM PATCH ANGIOPLASTY;  Surgeon: Serafina Mitchell, MD;  Location: [facility];  Service: Vascular;  Laterality: Right;  . LUMBAR LAMINECTOMY/DECOMPRESSION MICRODISCECTOMY N/A 09/06/2014    Procedure: LUMBER DECOMPRESSION L3-5;  Surgeon: Melina Schools, MD;  Location: [facility];  Service: Orthopedics;  Laterality: N/A;  . LUMBAR SPINE SURGERY   2002  . PROSTATE BIOPSY      . PROSTATECTOMY   02/2007  . VASECTOMY               Family History  Problem Relation Age of Onset  . Kidney disease Mother    . Stroke Father    . Breast cancer Sister           breast progressed into bone  . Colon cancer Neg Hx    . Prostate cancer Neg Hx    . Pancreatic cancer Neg Hx        Social History:  Married. Wife was assisting with ADLs PTA and has had decline in the reports that he quit smoking about 11 years ago. His smoking use included cigarettes. He has a 40.00 pack-year smoking history. He quit smokeless tobacco use about 10 years ago. He reports current alcohol use of about 3.0 standard drinks of alcohol per week. He reports that he does not use drugs.          Allergies  Allergen Reactions  . Other Other (See Comments)      THE PATIENT'S SKIN HAS BECOME VERY THIN- BRUISES AND TEARS VERY EASILY!!  . Tape Other (See Comments)      THE PATIENT'S SKIN HAS BECOME VERY THIN- BRUISES AND TEARS VERY EASILY!!  . Lisinopril Cough            Medications Prior to Admission  Medication Sig Dispense Refill  . amLODipine (NORVASC) 10 MG tablet TAKE 1 TABLET(10 MG) BY MOUTH EVERY EVENING (Patient taking differently: Take 10 mg by mouth every evening.) 90 tablet 1  . atorvastatin (LIPITOR) 80 MG tablet TAKE 1 TABLET(80 MG) BY MOUTH AT BEDTIME (Patient taking differently: Take 80 mg by mouth at bedtime.) 90 tablet 1  . calcium citrate (CALCITRATE - DOSED IN MG ELEMENTAL CALCIUM) 950 (200 Ca) MG tablet Take 1 tablet (200 mg of elemental calcium total) by mouth daily. (Patient taking differently: Take 200 mg of elemental calcium by mouth daily after breakfast.) 30 tablet 0  . dexamethasone (DECADRON) 2 MG tablet Take 1-2 tablets (2-4 mg total) by mouth See admin instructions. Take 1 tablet (2mg ) by mouth in the morning and 2 tablets (4mg ) by mouth in the evening. (Patient taking differently: Take 2-4 mg by mouth See admin instructions. Take 2 mg by mouth in the morning and 4 mg in the evening)      . levETIRAcetam (KEPPRA) 750 MG tablet Take 2 tablets (1,500 mg total) by mouth 2 (two) times daily. (Patient taking differently: Take 1,500 mg by mouth in the morning and  at bedtime.) 120 tablet 3  . LORazepam (ATIVAN) 0.5 MG tablet Take 0.5 mg by mouth See admin instructions. Take 0.5 mg by mouth as directed prior to MRI(s)      . metoprolol succinate (TOPROL-XL) 25 MG 24 hr tablet TAKE 1/2 TABLET(12.5 MG) BY MOUTH EVERY EVENING (Patient taking differently: Take 12.5 mg by mouth every evening.) 45 tablet 2      Drug Regimen Review  Drug regimen was reviewed and remains appropriate with no significant issues identified   Home: Home Living Family/patient expects to be discharged to:: Private residence Living Arrangements: Spouse/significant other Available Help at Discharge: Family,Available 24 hours/day Type of Home: House Home Access: Stairs to enter [employer] of Steps: 3 Entrance Stairs-Rails: Right,Left,Can reach both Home Layout: Two level,Bed/bath upstairs Alternate Level Stairs-Number of Steps: has stair lift Alternate Level Stairs-Rails: Right,Left,Can reach both Bathroom Shower/Tub: Walk-in shower,Other (comment) (wife gives sponge baths) Bathroom Toilet: Handicapped height (also uses urinal) Bathroom Accessibility: Yes Home Equipment: Cane - single point,Walker - 2 wheels,Shower seat,Grab bars - tub/shower,Toilet riser  Lives With: Spouse   Functional History: Prior Function Level of Independence: Needs assistance Gait / Transfers Assistance Needed: RW at baseline for mobility in home and community dwellings. Recent decline in function requiring increased external assist from family. ADL's / Homemaking Assistance Needed: Wife assist with ADLs at baseline. More assist required recently 2/2 steady decline in function.   Functional Status:  Mobility: Bed Mobility Overal bed mobility: Needs Assistance Bed Mobility: Sit to Supine Supine to sit: Mod assist General bed mobility comments: pt requires assist to elevate LEs onto bed, verbal cues for technique and hand placement Transfers Overall transfer level: Needs  assistance Equipment used: Rolling walker (2 wheeled),1 person hand held assist Transfer via Lift Equipment: Stedy Transfers: Sit to/from [employer] to Stand: Max assist Squat pivot transfers: Max assist General transfer comment: pt requires PT cues for initiation as well as bilateral UE support or use of walker. PT provides knee block for suqat pivot transfer with use of drop arm recliner Ambulation/Gait General Gait Details: nt   ADL: ADL Overall ADL's : Needs assistance/impaired Grooming: Minimal assistance,Sitting Lower Body Dressing: Total assistance,Bed level Lower Body Dressing Details (indicate cue type and reason): Total A to don footwear seated EOB. Toilet Transfer: Total assistance,+2 for physical assistance,+2 for safety/equipment Toilet Transfer Details (indicate cue type and reason): Simulated with transfer to recliner and Stedy with Max A +2. Patient able to stand for <10 sec at a time.   Cognition: Cognition Overall Cognitive Status: Impaired/Different from baseline Orientation Level: Oriented to person,Oriented to time Cognition Arousal/Alertness: Awake/alert Behavior During Therapy: WFL for tasks assessed/performed Overall Cognitive Status: Impaired/Different from baseline Area of Impairment: Memory,Following commands,Safety/judgement,Awareness,Problem solving Orientation Level: Disoriented to,Time,Situation,Place Current Attention Level: Sustained Memory: Decreased recall of precautions,Decreased short-term memory Following Commands: Follows one step commands with increased time Safety/Judgement: Decreased awareness of safety,Decreased awareness of deficits Awareness: Intellectual Problem Solving: Slow processing,Decreased initiation,Difficulty sequencing,Requires tactile cues,Requires verbal cues General Comments: Patient only oriented to person. States that he is at home. Patient mistaking this therapist for his wife. Patient requires  maximal multimodal and step-by-step instructions to sequence tasks assessed. Very poor attention to task requiring frequent redirection.   : Blood pressure (!) 117/58, pulse 70, temperature 97.9 F (36.6 C), temperature source Oral, resp. rate 18, height 5\' 11"  (1.803 m), weight 96 kg, SpO2 95 %. Physical Exam Vitals and nursing note reviewed.  Constitutional:      Appearance: Normal appearance.  HENT:     Right Ear: External ear normal.     Left Ear: External ear normal.     Nose: Nose normal.     Mouth/Throat:     Mouth: Mucous membranes are moist.  Eyes:     Extraocular Movements: Extraocular movements intact.     Pupils: Pupils are equal, round, and reactive to light.  Cardiovascular:     Rate  and Rhythm: Normal rate and regular rhythm.     Pulses: Normal pulses.     Heart sounds: No murmur heard. No friction rub.  Pulmonary:     Effort: Pulmonary effort is normal. No respiratory distress.     Breath sounds: No wheezing or rales.  Abdominal:     General: Bowel sounds are normal. There is no distension.     Tenderness: There is no abdominal tenderness. There is no guarding.  Musculoskeletal:        General: No swelling or deformity.     Cervical back: Normal range of motion.  Skin:    Comments: Right heel red, boggy, signs of prior breakdown, left heel sl red. Sacral stage II  Neurological:     Mental Status: He is alert.     Comments: Left inattention, Left sided field cut, loss of peripheral field on right also, poor visual acuity as a whole. Also with decreased hearing affecting overall output. Slow to process, easily frustrated but able to answer orientation questions with occasional cues--tends to perseverate at times. RUE 3/5. LUE 2+ to 3/5 with inconsistent effort. LE 2/5 HF, KE and 3/5 ADF/PF. Grossly senses pain and LT in all 4 but did not participate consistently.   Psychiatric:     Comments: Occasionally irritable but generally cooperative.        Lab Results  Last 48 Hours        Results for orders placed or performed during the hospital encounter of 05/06/20 (from the past 48 hour(s))  Glucose, capillary     Status: Abnormal    Collection Time: 05/11/20 12:53 PM  Result Value Ref Range    Glucose-Capillary 192 (H) 70 - 99 mg/dL      Comment: Glucose reference range applies only to samples taken after fasting for at least 8 hours.  Glucose, capillary     Status: Abnormal    Collection Time: 05/11/20  7:04 PM  Result Value Ref Range    Glucose-Capillary 218 (H) 70 - 99 mg/dL      Comment: Glucose reference range applies only to samples taken after fasting for at least 8 hours.  Glucose, capillary     Status: Abnormal    Collection Time: 05/11/20 11:53 PM  Result Value Ref Range    Glucose-Capillary 194 (H) 70 - 99 mg/dL      Comment: Glucose reference range applies only to samples taken after fasting for at least 8 hours.    Comment 1 Notify RN      Comment 2 Document in Chart    CBC     Status: Abnormal    Collection Time: 05/12/20  2:52 AM  Result Value Ref Range    WBC 4.7 4.0 - 10.5 K/uL    RBC 3.71 (L) 4.22 - 5.81 MIL/uL    Hemoglobin 11.6 (L) 13.0 - 17.0 g/dL    HCT 32.3 (L) 39.0 - 52.0 %    MCV 87.1 80.0 - 100.0 fL    MCH 31.3 26.0 - 34.0 pg    MCHC 35.9 30.0 - 36.0 g/dL    RDW 14.3 11.5 - 15.5 %    Platelets 119 (L) 150 - 400 K/uL      Comment: Immature Platelet Fraction may be clinically indicated, consider ordering this additional test [id number] CONSISTENT WITH PREVIOUS RESULT      nRBC 0.0 0.0 - 0.2 %      Comment: Performed at [facility], [place],  [place] [address]  Basic metabolic panel     Status: Abnormal    Collection Time: 05/12/20  2:52 AM  Result Value Ref Range    Sodium 136 135 - 145 mmol/L    Potassium 3.9 3.5 - 5.1 mmol/L    Chloride 101 98 - 111 mmol/L    CO2 26 22 - 32 mmol/L    Glucose, Bld 205 (H) 70 - 99 mg/dL      Comment: Glucose reference range applies only to samples  taken after fasting for at least 8 hours.    BUN 15 8 - 23 mg/dL    Creatinine, Ser 0.49 (L) 0.61 - 1.24 mg/dL    Calcium 8.6 (L) 8.9 - 10.3 mg/dL    GFR, Estimated >60 >60 mL/min      Comment: (NOTE) Calculated using the CKD-EPI Creatinine Equation (2021)      Anion gap 9 5 - 15      Comment: Performed at [facility] [address], [place], [place] [address]  Glucose, capillary     Status: Abnormal    Collection Time: 05/12/20  6:11 AM  Result Value Ref Range    Glucose-Capillary 188 (H) 70 - 99 mg/dL      Comment: Glucose reference range applies only to samples taken after fasting for at least 8 hours.    Comment 1 Notify RN      Comment 2 Document in Chart    Glucose, capillary     Status: Abnormal    Collection Time: 05/12/20 11:55 AM  Result Value Ref Range    Glucose-Capillary 131 (H) 70 - 99 mg/dL      Comment: Glucose reference range applies only to samples taken after fasting for at least 8 hours.  Glucose, capillary     Status: Abnormal    Collection Time: 05/12/20  5:37 PM  Result Value Ref Range    Glucose-Capillary 182 (H) 70 - 99 mg/dL      Comment: Glucose reference range applies only to samples taken after fasting for at least 8 hours.  Troponin I (High Sensitivity)     Status: Abnormal    Collection Time: 05/12/20  7:29 PM  Result Value Ref Range    Troponin I (High Sensitivity) 32 (H) <18 ng/L      Comment: (NOTE) Elevated high sensitivity troponin I (hsTnI) values and significant  changes across serial measurements may suggest ACS but many other  chronic and acute conditions are known to elevate hsTnI results.  Refer to the "Links" section for chest pain algorithms and additional  guidance. Performed at [facility], [place] [address] [place], [place] [address]    Troponin I (High Sensitivity)     Status: Abnormal    Collection Time: 05/12/20  9:33 PM  Result Value Ref Range    Troponin I (High Sensitivity) 26 (H) <18 ng/L      Comment:  (NOTE) Elevated high sensitivity troponin I (hsTnI) values and significant  changes across serial measurements may suggest ACS but many other  chronic and acute conditions are known to elevate hsTnI results.  Refer to the "Links" section for chest pain algorithms and additional  guidance. Performed at [facility], [place] [address], [place], [place] [address]    Glucose, capillary     Status: Abnormal    Collection Time: 05/13/20 12:12 AM  Result Value Ref Range    Glucose-Capillary 290 (H) 70 - 99 mg/dL  Comment: Glucose reference range applies only to samples taken after fasting for at least 8 hours.  Glucose, capillary     Status: Abnormal    Collection Time: 05/13/20  5:54 AM  Result Value Ref Range    Glucose-Capillary 227 (H) 70 - 99 mg/dL      Comment: Glucose reference range applies only to samples taken after fasting for at least 8 hours.      Imaging Results (Last 48 hours)  No results found.           Medical Problem List and Plan: 1.  Functional and mobility deficits secondary to debility/encephalopathy after recurrent seizures and associated respiratory failure. Pt with history of GBM/resection 06/2019        -patient may shower             -ELOS/Goals: 14-18 days, min to mod assist with PT, OT, SLP 2.  Antithrombotics: -DVT/anticoagulation:  Pharmaceutical: Heparin             -antiplatelet therapy: N/a 3. Pain Management: Tylenol prn 4. Mood: LCSW to follow for evaluation and support.              -antipsychotic agents: N/A 5. Neuropsych: This patient is not fully capable of making decisions on his own behalf. 6. Skin/Wound Care: Routine pressure relief measures.              -prevalon boots for bilateral heels             -encourage adequate PO intake 7. Fluids/Electrolytes/Nutrition: Monitor I/O. Check lytes in am.  8. HTN: Monitor BP tid--continue Norvasc and metoprolol 9. Steroid induced hyperglycemia: Monitor BS ac/hs and use SSI for elevated  BS. 10. Seizures: Has been seizure free on Keppra 2000 mg bid. Continue decadron bid and monitor for now.  11. Bouts of agitation: Have resolved-->change Pyridoxine to oral route. Continue to use ativan prn anxiety.  12. Hypoxia: Currently on 2 L per [place] (dropped to 80's on RA per tech) ---continue oxygen per [place].              -CXR reveals atelectasis and low lung volumes             -IS,OOB, and wean oxygen as able.            Bary Leriche, PA-C 05/13/2020  I have personally performed a face to face diagnostic evaluation of this patient and formulated the key components of the plan.  Additionally, I have personally reviewed laboratory data, imaging studies, as well as relevant notes and concur with the physician assistant's documentation above.  The patient's status has not changed from the original H&P.  Any changes in documentation from the acute care chart have been noted above.  Meredith Staggers, MD, [facility]

## 2020-05-14 ENCOUNTER — Inpatient Hospital Stay: Payer: Medicare Other | Admitting: Internal Medicine

## 2020-05-14 ENCOUNTER — Inpatient Hospital Stay: Payer: Medicare Other

## 2020-05-14 ENCOUNTER — Inpatient Hospital Stay (HOSPITAL_COMMUNITY): Payer: Medicare Other

## 2020-05-14 DIAGNOSIS — R739 Hyperglycemia, unspecified: Secondary | ICD-10-CM

## 2020-05-14 LAB — CBC WITH DIFFERENTIAL/PLATELET
Abs Immature Granulocytes: 0.07 10*3/uL (ref 0.00–0.07)
Basophils Absolute: 0 10*3/uL (ref 0.0–0.1)
Basophils Relative: 0 %
Eosinophils Absolute: 0 10*3/uL (ref 0.0–0.5)
Eosinophils Relative: 0 %
HCT: 33.7 % — ABNORMAL LOW (ref 39.0–52.0)
Hemoglobin: 11.2 g/dL — ABNORMAL LOW (ref 13.0–17.0)
Immature Granulocytes: 2 %
Lymphocytes Relative: 4 %
Lymphs Abs: 0.1 10*3/uL — ABNORMAL LOW (ref 0.7–4.0)
MCH: 29.9 pg (ref 26.0–34.0)
MCHC: 33.2 g/dL (ref 30.0–36.0)
MCV: 89.9 fL (ref 80.0–100.0)
Monocytes Absolute: 0.3 10*3/uL (ref 0.1–1.0)
Monocytes Relative: 9 %
Neutro Abs: 3.2 10*3/uL (ref 1.7–7.7)
Neutrophils Relative %: 85 %
Platelets: 124 10*3/uL — ABNORMAL LOW (ref 150–400)
RBC: 3.75 MIL/uL — ABNORMAL LOW (ref 4.22–5.81)
RDW: 14.3 % (ref 11.5–15.5)
WBC: 3.7 10*3/uL — ABNORMAL LOW (ref 4.0–10.5)
nRBC: 0 % (ref 0.0–0.2)

## 2020-05-14 LAB — COMPREHENSIVE METABOLIC PANEL
ALT: 19 U/L (ref 0–44)
AST: 16 U/L (ref 15–41)
Albumin: 2.3 g/dL — ABNORMAL LOW (ref 3.5–5.0)
Alkaline Phosphatase: 53 U/L (ref 38–126)
Anion gap: 9 (ref 5–15)
BUN: 11 mg/dL (ref 8–23)
CO2: 30 mmol/L (ref 22–32)
Calcium: 8.5 mg/dL — ABNORMAL LOW (ref 8.9–10.3)
Chloride: 96 mmol/L — ABNORMAL LOW (ref 98–111)
Creatinine, Ser: 0.56 mg/dL — ABNORMAL LOW (ref 0.61–1.24)
GFR, Estimated: >60 mL/min (ref >60)
Glucose, Bld: 231 mg/dL — ABNORMAL HIGH (ref 70–99)
Potassium: 4.1 mmol/L (ref 3.5–5.1)
Sodium: 135 mmol/L (ref 135–145)
Total Bilirubin: 1.1 mg/dL (ref 0.3–1.2)
Total Protein: 5.3 g/dL — ABNORMAL LOW (ref 6.5–8.1)

## 2020-05-14 LAB — GLUCOSE, CAPILLARY
Glucose-Capillary: 216 mg/dL — ABNORMAL HIGH (ref 70–99)
Glucose-Capillary: 155 mg/dL — ABNORMAL HIGH (ref 70–99)
Glucose-Capillary: 169 mg/dL — ABNORMAL HIGH (ref 70–99)
Glucose-Capillary: 132 mg/dL — ABNORMAL HIGH (ref 70–99)

## 2020-05-14 MED ORDER — COLLAGENASE 250 UNIT/GM EX OINT
1.0000 "application " | TOPICAL_OINTMENT | Freq: Every day | CUTANEOUS | Status: DC
  Administered 2020-05-15 – 2020-06-10 (×27): 1 via TOPICAL
  Filled 2020-05-14 (×2): qty 30

## 2020-05-14 MED ORDER — ENSURE MAX PROTEIN PO LIQD
11.0000 [oz_av] | Freq: Every day | ORAL | Status: DC
  Administered 2020-05-14 – 2020-06-11 (×24): 11 [oz_av] via ORAL
  Filled 2020-05-14: qty 330

## 2020-05-14 MED ORDER — GLIMEPIRIDE 2 MG PO TABS
1.0000 mg | ORAL_TABLET | Freq: Every day | ORAL | Status: DC
  Administered 2020-05-14 – 2020-05-23 (×10): 1 mg via ORAL
  Filled 2020-05-14 (×10): qty 1

## 2020-05-14 NOTE — Progress Notes (Signed)
Inpatient Rehabilitation  Patient information reviewed and entered into eRehab system by Finnegan Gatta M. Ahmarion Saraceno, M.A., CCC/SLP, PPS Coordinator.  Information including medical coding, functional ability and quality indicators will be reviewed and updated through discharge.    

## 2020-05-14 NOTE — Patient Care Conference (Signed)
Inpatient RehabilitationTeam Conference and Plan of Care Update Date: 05/14/2020   Time: 10:20 AM    Patient Name: [name]      Medical Record Number: [id number]  Date of Birth: 02-04-1946 Sex: Male         Room/Bed: 4W14C/4W14C-01 Payor Info: Payor: MEDICARE / Plan: MEDICARE PART A AND B / Product Type: *No Product type* /    Admit Date/Time:  05/13/2020  3:12 PM  Primary Diagnosis:  Physical debility  Hospital Problems: Principal Problem:   Physical debility    Expected Discharge Date: Expected Discharge Date:  (3-4 Weeks)  Team Members Present: Physician leading conference: Dr. Alger Simons Care Coodinator Present: [name], LCSWA;Verlie Hellenbrand Creig Hines, RN, BSN, Jeff Nurse Present: Mohammed Kindle, RN PT Present: Apolinar Junes, PT OT Present: Laverle Hobby, OT SLP Present: Jettie Booze, CF-SLP PPS Coordinator present : Ileana Ladd, Burna Mortimer, SLP     Current Status/Progress Goal Weekly Team Focus  Bowel/Bladder   Incontinent of bowel and bladder: LBM: 05/13/2020  Less periods of incontinence  Timed toileting q2 hours and PRN   Swallow/Nutrition/ Hydration   evaluation pending         ADL's   max-total A overall for ADLs, poor motor planning and cognitive deficits  mod A overall for ADLs  cognition, motor planning, ADL retraining, transfers   Mobility   Pending PT evaluation         Communication   evaluation pending         Safety/Cognition/ Behavioral Observations  evaluation pending         Pain   Pt denies pain  Pain score <3/10  Assess pain score q shift and prn   Skin   Skin breakdown to sacrum, scattered bruising  Prevent further skin breakdown, turn and reposition pt q 2hours  Assess skin q shift     Discharge Planning:  To be assessed. Per EMR, pt wife to provide 24/7 care.   Team Discussion: Deconditioned, confused, would like to get him back to where his wife can manage him. Last night he was yelling and screaming. Wife stated  he does that at night. He has a condom cath on currently and he is incontinent. Patient on target to meet rehab goals: Max to total assist for ADL's. Motor planning deficits, intermittent confusion, grumpy. Mod assist goals. At this point not sure about going home with wife?  PT eval pending.  SLP eval pending.  *See Care Plan and progress notes for long and short-term goals.   Revisions to Treatment Plan:  MD requesting patient be moved to a private room.  Teaching Needs: Family education, medication management, wound/skin care, transfer training, gait training, Oxygen management.  Current Barriers to Discharge: Inaccessible home environment, Decreased caregiver support, Home enviroment access/layout, Incontinence, Wound care, Lack of/limited family support, Weight, Medication compliance, Pending chemo/radiation and Behavior  Possible Resolutions to Barriers: Continue current medications, monitor sleep/wake cycle, provide emotional support to patient and family.     Medical Summary Current Status: debility after seizures/ gbs. on vent for a short period, still with cough. hs confusion reported although cognitive status generally improving  Barriers to Discharge: Medical stability   Possible Resolutions to [employer] Focus: seizure rx, daily assessment of labs/pt data, ensure appropriate oxygenation. improve aeration of lungs   Continued Need for Acute Rehabilitation Level of Care: The patient requires daily medical management by a physician with specialized training in physical medicine and rehabilitation for the following reasons: Direction of a multidisciplinary  physical rehabilitation program to maximize functional independence : Yes Medical management of patient stability for increased activity during participation in an intensive rehabilitation regime.: Yes Analysis of laboratory values and/or radiology reports with any subsequent need for medication adjustment and/or medical  intervention. : Yes   I attest that I was present, lead the team conference, and concur with the assessment and plan of the team.   [name] 05/14/2020, 2:19 PM

## 2020-05-14 NOTE — Progress Notes (Signed)
[facility] PHYSICAL MEDICINE & REHABILITATION PROGRESS NOTE   Subjective/Complaints: Had a reasonable night. Up working with OT when I came in. Seems to be engaged/participating with therapy. Occasional productive cough  ROS: Patient denies fever, rash, sore throat, blurred vision, nausea, vomiting, diarrhea,  shortness of breath or chest pain, joint or back pain, headache, or mood change.    Objective:   DG Chest 2 View  Result Date: 05/13/2020 CLINICAL DATA:  Hypoxia EXAM: CHEST - 2 VIEW COMPARISON:  05/06/2020 chest radiograph. FINDINGS: Intact sternotomy wires. Stable cardiomediastinal silhouette with normal heart size. No pneumothorax. No pleural effusion. Low lung volumes. Hazy opacities at both lung bases, left greater than right, slightly improved on the left. No pulmonary edema. IMPRESSION: Low lung volumes with hazy bibasilar lung opacities, left greater than right, slightly improved on the left, favor atelectasis. Electronically Signed   By: Ilona Sorrel M.D.   On: 05/13/2020 16:46   Recent Labs    05/12/20 0252 05/14/20 0559  WBC 4.7 3.7*  HGB 11.6* 11.2*  HCT 32.3* 33.7*  PLT 119* 124*   Recent Labs    05/12/20 0252 05/14/20 0559  NA 136 135  K 3.9 4.1  CL 101 96*  CO2 26 30  GLUCOSE 205* 231*  BUN 15 11  CREATININE 0.49* 0.56*  CALCIUM 8.6* 8.5*    Intake/Output Summary (Last 24 hours) at 05/14/2020 1950 Last data filed at 05/14/2020 0534 Gross per 24 hour  Intake 480 ml  Output 1000 ml  Net -520 ml     Pressure Injury 05/07/20 Sacrum Mid Stage 2 -  Partial thickness loss of dermis presenting as a shallow open injury with a red, pink wound bed without slough. (Active)  05/07/20 0132  Location: Sacrum  Location Orientation: Mid  Staging: Stage 2 -  Partial thickness loss of dermis presenting as a shallow open injury with a red, pink wound bed without slough.  Wound Description (Comments):   Present on Admission: Yes    Physical Exam: Vital  Signs Blood pressure 114/78, pulse 86, temperature 98.7 F (37.1 C), temperature source Oral, resp. rate 20, height 5\' 11"  (1.803 m), weight 96 kg, SpO2 97 %.  General: Alert and oriented x 3, No apparent distress HEENT: Head is normocephalic, atraumatic, PERRLA, EOMI, sclera anicteric, oral mucosa pink and moist, dentition intact, ext ear canals clear,  Neck: Supple without JVD or lymphadenopathy Heart: Reg rate and rhythm. No murmurs rubs or gallops Chest: some upper airway sounds. Decreased sounds at bases. Normal breathing pattern. Abdomen: Soft, non-tender, non-distended, bowel sounds positive. Extremities: No clubbing, cyanosis, or edema. Pulses are 2+ Psych: Pt's affect is appropriate. Pt is cooperative Skin: sacral stage II, right heel redness, boggy--chronic, mild bogginess left heel Neuro: fair insight and awareness. Reasonably alert.. Cranial nerves 2-12 are intact. Sensory exam is normal. Reflexes are 1+ in all 4's. Motor 3/5 RUE and 2+ to 3/5 LUE, BLE grossly 2/5 prox to 3+/5 distally.  Musculoskeletal: Full ROM, No pain with AROM or PROM in the neck, trunk, or extremities. Posture appropriate     Assessment/Plan: 1. Functional deficits which require 3+ hours per day of interdisciplinary therapy in a comprehensive inpatient rehab setting.  Physiatrist is providing close team supervision and 24 hour management of active medical problems listed below.  Physiatrist and rehab team continue to assess barriers to discharge/monitor patient progress toward functional and medical goals  Care Tool:  Bathing    Body parts bathed by patient: Right arm,Left arm,Chest,Abdomen,Face  Body parts bathed by helper: Front perineal area,Buttocks,Right upper leg,Left upper leg,Right lower leg,Left lower leg     Bathing assist Assist Level: Maximal Assistance - Patient 24 - 49%     Upper Body Dressing/Undressing Upper body dressing   What is the patient wearing?: Pull over shirt     Upper body assist Assist Level: Maximal Assistance - Patient 25 - 49%    Lower Body Dressing/Undressing Lower body dressing      What is the patient wearing?: Incontinence brief     Lower body assist Assist for lower body dressing: Dependent - Patient 0%     Toileting Toileting Toileting Activity did not occur (Clothing management and hygiene only): N/A (no void or bm)  Toileting assist Assist for toileting: Moderate Assistance - Patient 50 - 74%     Transfers Chair/bed transfer  Transfers assist  Chair/bed transfer activity did not occur: Safety/medical concerns        Locomotion Ambulation   Ambulation assist              Walk 10 feet activity   Assist           Walk 50 feet activity   Assist           Walk 150 feet activity   Assist           Walk 10 feet on uneven surface  activity   Assist           Wheelchair     Assist               Wheelchair 50 feet with 2 turns activity    Assist            Wheelchair 150 feet activity     Assist          Blood pressure 114/78, pulse 86, temperature 98.7 F (37.1 C), temperature source Oral, resp. rate 20, height 5\' 11"  (1.803 m), weight 96 kg, SpO2 97 %.  Medical Problem List and Plan: 1.Functional and mobility deficitssecondary to debility after recurrent seizures and associated respiratory failure. Resolving encephalopathy.  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 Antithrombotics: -DVT/anticoagulation:Pharmaceutical:Heparin -antiplatelet therapy: N/a 3. Pain Management:Tylenol prn 4. Mood:LCSW to follow for evaluation and support. -antipsychotic agents: N/A 5. Neuropsych: This patientis not fullycapable of making decisions onhisown behalf. 6. Skin/Wound Care:Routine pressure  relief measures. -prevalon boots for bilateral heels -encourage adequate PO intake 7. Fluids/Electrolytes/Nutrition:encourage PO  -I personally reviewed all of the patient's labs today, and lab work is within normal limits except for BG and albumin   -offer protein supps  8. HTN: Monitor BP tid--continue Norvasc and metoprolol  -controlled 9. Steroid induced hyperglycemia: Monitor BS ac/hs and use SSI for elevated BS.  -if plan is to continue decadron, he'll need to be on something scheduled to better control CBG's   -begin amaryl 1mg  daily 10. GBM/Seizures: Has been seizure free on Keppra 2000 mg bid.  -continue decadron 2mg  in am and 4mg  in pm  -followed by Dr. Rosanne Ashing wants to continue treatments after discharge 11. Bouts of agitation: Have resolved-->changed Pyridoxine to oral route. Continue to use ativan prn anxiety---limit benzos as possible 12. Hypoxia: Currently on 2 L per [place]  ---continue oxygen per [place]. -CXR reveals atelectasis and low lung volumes -IS,OOB,discussed with pt today---will need cueing     LOS: 1 days A FACE  TO FACE EVALUATION WAS PERFORMED  Meredith Staggers 05/14/2020, 9:27 AM

## 2020-05-14 NOTE — Progress Notes (Signed)
Occupational Therapy Session Note  Patient Details  Name: SULAIMAN IMBERT MRN: [id number] Date of Birth: November 05, 1945  Today's Date: 05/14/2020 OT Individual Time: 1300-1415 OT Individual Time Calculation (min): 75 min    Short Term Goals: Week 1:  OT Short Term Goal 1 (Week 1): Pt will maintain EOB sitting balance with mod A OT Short Term Goal 2 (Week 1): Pt will set up grooming tasks with supervision to demo improved motor planning OT Short Term Goal 3 (Week 1): Pt will don shirt with mod A to demo improved sequencing OT Short Term Goal 4 (Week 1): Pt will require no more than min cueing to attend to dressing task  Skilled Therapeutic Interventions/Progress Updates:    Pt received supine with his wife present. Provided edu to wife re CLOF, OT POC, and rehab expectations/ELOS. She is very supportive and actively involved in her husbands care. Extra time required throughout session to initiate tasks, 2/2 anxiety and slow processing. Pt came EOB with max A, +2 helpful in managing trunk. Once stabilized and feet securely on floor pt was able to maintain sitting balance with CGA. [name] was obtained and pt required heavy +2 assist to stand, with several attempts. HOB elevated. Pt very anxious re falling. He was transferred via stedy to a TIS w/c. Extra time spent making pt feel safe in the chair and demonstrating recline features. Pt on 4L O2 via [place] throughout first half of session with SpO2 reading >98%. Titrated O2 down to 2L and then to RA to assess saturation. Pt able to maintain Spo2 at 91-93% on RA for several minutes before dropping to 89%, on 2L he rebounded to 95% and had required no further titration throughout session. Pt completed oral care at the sink with min A to set up and appropriately sequence steps. He was amendable to change his shirt, and although he was quite insistent on his wife helping, he was able to don it with mod A- already an improvement from this mornings session. Cued pt  through 2 sets of 10 breaths on the incentive spirometer for lung expansion. Low HR reading on pulse oximeter, so notified RN who found a more normal reading manually. Pt reporting his stomach hurt and wanting to return to bed. He was unable to stand from TIS w/c with max +2 assist and required total +3 to stand. He was transferred to bed and to bed pan. Pt left supine with wife present, all needs within reach.    Therapy Documentation Precautions:  Precautions Precautions: Fall Precaution Comments: Peripheral visual field cut L>R, seizures Restrictions Weight Bearing Restrictions: No Other Position/Activity Restrictions: on 2-4 L/min O2  Therapy/Group: Individual Therapy  Curtis Sites 05/14/2020, 2:51 PM

## 2020-05-14 NOTE — Evaluation (Signed)
Physical Therapy Assessment and Plan  Patient Details  Name: [name] MRN: [id number] Date of Birth: 12-23-1945  PT Diagnosis: Abnormal posture, Abnormality of gait, Coordination disorder, Difficulty walking, Hemiplegia non-dominant, Impaired cognition and Muscle weakness Rehab Potential: Fair ELOS: 3-4 weeks   Today's Date: 05/14/2020 PT Individual Time: 1030-1130 PT Individual Time Calculation (min): 60 min    Hospital Problem: Principal Problem:   Physical debility   Past Medical History:  Past Medical History:  Diagnosis Date  . Anxiety   . Arthritis   . ASCVD (arteriosclerotic cardiovascular disease)    03/2004: DES x2 to the RCA; IMI with stent occlusion in 02/2005- suboptimal Plavix compliance  . CAD (coronary artery disease)   . Carcinoma of prostate ([place])    Clopidogrel held for biopsy  . Constipation due to pain medication   . Diabetes mellitus without complication ([place])   . Diabetic neuropathy ([place])   . Headache 2021  . Hyperlipidemia   . Hypertension   . Mild depression ([place])   . Morbid obesity ([place])   . Myocardial infarction ([place])   . Shortness of breath dyspnea   . Sleep apnea    no longer uses cpap  . Stroke ([place]) 2017  . Tobacco abuse    stopped smoking 06/28/09   Past Surgical History:  Past Surgical History:  Procedure Laterality Date  . APPLICATION OF CRANIAL NAVIGATION N/A 07/04/2019   Procedure: APPLICATION OF CRANIAL NAVIGATION;  Surgeon: Ashok Pall, MD;  Location: [place];  Service: Neurosurgery;  Laterality: N/A;  . BACK SURGERY    . CARDIAC CATHETERIZATION     X 1 stent before having CABG  . CORONARY ARTERY BYPASS GRAFT  06/28/2008   X4  . CRANIOTOMY Right 07/04/2019   Procedure: Right occipital craniotomy for tumor with brainlab;  Surgeon: Ashok Pall, MD;  Location: [place];  Service: Neurosurgery;  Laterality: Right;  . ENDARTERECTOMY Right 07/03/2015   Procedure: RIGHT CAROTID ENDARTERECTOMY WITH BOVINE PERICARDIUM PATCH  ANGIOPLASTY;  Surgeon: Serafina Mitchell, MD;  Location: [place];  Service: Vascular;  Laterality: Right;  . LUMBAR LAMINECTOMY/DECOMPRESSION MICRODISCECTOMY N/A 09/06/2014   Procedure: LUMBER DECOMPRESSION L3-5;  Surgeon: Melina Schools, MD;  Location: [place];  Service: Orthopedics;  Laterality: N/A;  . LUMBAR SPINE SURGERY  2002  . PROSTATE BIOPSY    . PROSTATECTOMY  02/2007  . VASECTOMY      Assessment & Plan Clinical Impression: Patient is a 75 y.o. year old male with history of ASCVD,T2DM, prostate cancer s/p prostatectomy, GBM s/p RSXN 06/2019, Covid 19 04/17/20, seizures who was admitted on 05/06/20 with fall due to recurrent seizures. Patient has failed chemo and salvage therapy was in process of being started. CT head showed residual masslike density right occipital lobe with similar edema and mass effect c/w 11/20201. GCS 3 at admission and he was loaded with Keppra Head and started on IV decadron. 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 He tolerated extubation without difficulty on 01/19 but had bouts of agitation. LT-EEG showed no further seizures but wife reported increase in agitation with increase in Keppra dose 12/21. Dr. Hortense Ramal recommended addition of Pyridoxine 100 mg daily for possible Keppra related agitation. Lethargy has resolved but he continues to be limited by weakness requiring increased level of assistance with basic ADL as well as Stedy lift with standing attempts. CIR was recommended due to functional decline.  Patient transferred to CIR on  05/13/2020 .   Patient currently requires max with mobility secondary to muscle weakness and muscle joint tightness, decreased cardiorespiratoy endurance and decreased oxygen support, impaired timing and sequencing, decreased coordination and decreased motor planning, decreased visual acuity and decreased visual motor skills, decreased  midline orientation and decreased motor planning, decreased initiation, decreased attention, decreased awareness, decreased problem solving, decreased safety awareness, decreased memory and delayed processing and decreased sitting balance, decreased standing balance, decreased postural control, hemiplegia and decreased balance strategies.  Prior to hospitalization, patient was min A up to November with slow decline to max A with mobility and lived with Spouse in a House home.  Home access is 3Stairs to enter.  Patient will benefit from skilled PT intervention to maximize safe functional mobility, minimize fall risk and decrease caregiver burden for planned discharge home with 24 hour assist.  Anticipate patient will benefit from follow up [facility] at discharge.  PT - End of Session Activity Tolerance: Tolerates 30+ min activity with multiple rests Endurance Deficit: Yes PT Assessment Rehab Potential (ACUTE/IP ONLY): Fair PT Barriers to Discharge: Inaccessible home environment;Incontinence;Lack of/limited family support;Behavior;New oxygen PT Patient demonstrates impairments in the following area(s): Balance;Behavior;Edema;Endurance;Motor;Sensory;Safety;Perception;Pain;Nutrition;Skin Integrity PT Transfers Functional Problem(s): Bed Mobility;Bed to Chair;Car;Furniture PT Locomotion Functional Problem(s): Ambulation;Wheelchair Mobility;Stairs PT Plan PT Intensity: Minimum of 1-2 x/day ,45 to 90 minutes PT Frequency: 5 out of 7 days PT Duration Estimated Length of Stay: 3-4 weeks PT Treatment/Interventions: Ambulation/gait training;Cognitive remediation/compensation;Discharge planning;DME/adaptive equipment instruction;Functional mobility training;Pain management;Psychosocial support;Splinting/orthotics;Therapeutic Activities;UE/LE Strength taining/ROM;Visual/perceptual remediation/compensation;Wheelchair propulsion/positioning;UE/LE Coordination activities;Therapeutic Exercise;Stair training;Skin care/wound  management;Patient/family education;Neuromuscular re-education;Functional electrical stimulation;Disease management/prevention;Community reintegration;Balance/vestibular training PT Transfers Anticipated Outcome(s): mod A using LRAD PT Locomotion Anticipated Outcome(s): mod A 25 ft using LRAD PT Recommendation Follow Up Recommendations: Home health PT Patient destination: Home Equipment Recommended: To be determined Equipment Details: TBD based on patient's progress, patient has 2 RW and a [facility]   PT Evaluation Precautions/Restrictions Precautions Precautions: Fall Precaution Comments: Peripheral visual field cut L>R, seizures Restrictions Other Position/Activity Restrictions: on 2-4 L/min O2 Home Living/Prior Functioning Home Living Available Help at Discharge: Family;Available 24 hours/day Type of Home: House Home Access: Stairs to enter [employer] of Steps: 3 Entrance Stairs-Rails: Right;Left;Can reach both Home Layout: Two level;Bed/bath upstairs Alternate Level Stairs-Number of Steps: has stair lift Alternate Level Stairs-Rails: Right;Left;Can reach both Bathroom Shower/Tub: Walk-in shower;Other (comment) (wife gives sponge baths) Bathroom Toilet: Handicapped height Bathroom Accessibility: Yes  Lives With: Spouse Prior Function Level of Independence: Needs assistance with ADLs;Requires assistive device for independence;Needs assistance with tranfers;Needs assistance with gait;Needs assistance with homemaking (per chart wife helping with all ADLs, RW used for transfers)  Able to Take Stairs?: No Driving: No Vision/Perception  Vision - Assessment Eye Alignment: Within Functional Limits Alignment/Gaze Preference: Within Defined Limits Tracking/Visual Pursuits: Impaired - to be further tested in functional context Perception Perception: Impaired Praxis Praxis Impairment Details: Initiation;Motor planning;Perseveration Praxis-Other Comments: perseverated on  feeling cold throughout session  Cognition Overall Cognitive Status: Impaired/Different from baseline Arousal/Alertness: Awake/alert Attention: Sustained Sustained Attention: Impaired Sustained Attention Impairment: Verbal basic;Functional basic Memory: Impaired Memory Impairment: Decreased short term memory;Decreased recall of new information Decreased Short Term Memory: Verbal basic Awareness: Impaired Awareness Impairment: Intellectual impairment Problem Solving: Impaired Problem Solving Impairment: Verbal basic Executive Function:  (all impaired) Safety/Judgment: Impaired Sensation Sensation Light Touch: Appears Intact (reports it is intact, did not formally test) Coordination Gross Motor Movements are Fluid and Coordinated: No Fine Motor Movements are Fluid and Coordinated: No Coordination and Movement Description: generalized weakness, deficits in  motor planning, and visualmotor deficits that impact coordination Heel Shin Test: unable due to LE weakness Motor  Motor Motor: Other (comment);Abnormal postural alignment and control;Hemiplegia Motor - Skilled Clinical Observations: generalized weakness L>R   Trunk/Postural Assessment  Cervical Assessment Cervical Assessment: Exceptions to [facility] (forward head) Thoracic Assessment Thoracic Assessment: Exceptions to [facility] (rounded shoulders, kyphotic posture) Lumbar Assessment Lumbar Assessment: Exceptions to [facility] (posterior pelvic tilt) Postural Control Postural Control: Deficits on evaluation Trunk Control: inadequate EOB Righting Reactions: delayed, inadequate Protective Responses: delayed, inadequate  Balance Balance Balance Assessed: Yes Static Sitting Balance Static Sitting - Balance Support: Feet supported;Bilateral upper extremity supported Static Sitting - Level of Assistance: 3: Mod assist Dynamic Sitting Balance Dynamic Sitting - Balance Support: Feet supported;Right upper extremity supported;Left upper extremity  supported Dynamic Sitting - Level of Assistance: 3: Mod assist Extremity Assessment  RLE Assessment RLE Assessment: Exceptions to [facility] Active Range of Motion (AROM) Comments: Reduced hip flexion in lying and sitting, tight heel cord General Strength Comments: Grossly 2+/5 throughout, able to slightly lift his leg against gravity through <50% of full ROM LLE Assessment LLE Assessment: Exceptions to [facility] Active Range of Motion (AROM) Comments: Reduced hip flexion in lying and sitting, tight heel cord General Strength Comments: Grossly 2-/5 throughout, unable to lift his leg against gravity without assist, able to move through >50% of range with gravity eliminated  Care Tool Care Tool Bed Mobility Roll left and right activity Roll left and right activity did not occur: Safety/medical concerns (unable without skilled intervention and use of bed rail)      Sit to lying activity Sit to lying activity did not occur: Safety/medical concerns (unable without skilled intervention and use of bed rail)      Lying to sitting edge of bed activity Lying to sitting edge of bed activity did not occur: Safety/medical concerns (unable without skilled intervention and use of bed rail)       Care Tool Transfers Sit to stand transfer Sit to stand activity did not occur: Safety/medical concerns (unable without skilled intervention and use of Stedy, max +2 for half stand)      Chair/bed transfer Chair/bed transfer activity did not occur: Safety/medical concerns (decreased strength/activity tolerance)       Scientist, research (physical sciences) transfer activity did not occur: Safety/medical concerns        Care Tool Locomotion Ambulation Ambulation activity did not occur: Safety/medical concerns (decreased strength/activity tolerance)        Walk 10 feet activity Walk 10 feet activity did not occur: Safety/medical concerns       Walk 50 feet with 2 turns activity Walk 50 feet with 2 turns  activity did not occur: Safety/medical concerns      Walk 150 feet activity Walk 150 feet activity did not occur: Safety/medical concerns      Walk 10 feet on uneven surfaces activity Walk 10 feet on uneven surfaces activity did not occur: Safety/medical concerns      Stairs Stair activity did not occur: Safety/medical concerns        Walk up/down 1 step activity Walk up/down 1 step or curb (drop down) activity did not occur: Safety/medical concerns     Walk up/down 4 steps activity did not occuR: Safety/medical concerns  Walk up/down 4 steps activity      Walk up/down 12 steps activity Walk up/down 12 steps activity did not occur: Safety/medical concerns      Pick up small  objects from floor Pick up small object from the floor (from standing position) activity did not occur: Safety/medical concerns      Wheelchair     Wheelchair activity did not occur: Safety/medical concerns (decreased strength/activity tolerance to transfer to w/c on eval)      Wheel 50 feet with 2 turns activity Wheelchair 50 feet with 2 turns activity did not occur: Safety/medical concerns    Wheel 150 feet activity Wheelchair 150 feet activity did not occur: Safety/medical concerns      Refer to Care Plan for Long Term Goals  SHORT TERM GOAL WEEK 1 PT Short Term Goal 1 (Week 1): Patient will perform bed mobility with mod A consistently. PT Short Term Goal 2 (Week 1): Patient will perform basic transfers with max A +2 using LRAD. PT Short Term Goal 3 (Week 1): Patient will sit OOB >1 hour 2x per day 4/7 days this week.  Recommendations for other services: Neuropsych  Skilled Therapeutic Intervention In addition to the PT evaluation above, the patient performed the following skilled PT interventions: Vitals: BP: 137/85 HR: 66 SPO2: 99% on 4 L/min  Patient in bed with his wife at bedside upon PT arrival. Patient alert and agreeable to PT session. Patient denied pain during session, but reported  that his R third toe gets sore when touched.   Patient with intermittent difficulty hearing/understanding PT throughout session. Patient was oriented, but looks to his wife to answer questions for him. Obtained home set up and PLF from his wife for time management and accuracy due to patient's cognitive and memory deficits. Patient presents with decreased initiation and motor planning, requires several cues and encouragement to initiate activities. Also, demonstrates and reports significant fear of falling and feeling "off balance" with mobility. Has posterior lean in sitting and difficulty with forward trunk flexion and weight shift for standing and transfers.    Neuromuscular Re-ed: Patient performed the following sitting balance activities: -sat EOB progressing from mod A to supervision >8 min with verbal cues and visual target for midline orientation due to posterior lean, progressed from B upper extremity support to no upper extremity support, to reaching (noted ataxia vs visual deficits with L upper extremity to reach to target) -forward trunk flexion x5 progressing to weight bearing into lower extremities x5, progressing into scooting forward x5 with mod-min A for facilitation of trunk/hip flexion and boosting through his lower extremities  Mobility Bed Mobility Bed Mobility: Rolling Right;Rolling Left;Supine to Sit;Sit to Supine Rolling Right: Moderate Assistance - Patient 50-74% (use of bed rail with hand over hand assist and his wife providing encouragement with improved response to cues) Rolling Left: Moderate Assistance - Patient 50-74% (use of bed rail with hand over hand assist and his wife providing encouragement with improved response to cues) Supine to Sit: Moderate Assistance - Patient 50-74% (Provided cues for rolling to L side-lying, as above, setting his bottom elbow and pushing up to his elbow then his hand to come to sitting.) Sit to Supine: Maximal Assistance - Patient 25-49%  (Required increased time with heavy cues to initiate due to confusion and poor motor planning. Attempted to lie toward the foot of the bed x2, required facilitation to lie toward the head of the bed.) Transfers Transfers: Sit to Stand;Stand to Sit Sit to Stand: Maximal Assistance - Patient 25-49%;2 Helpers Stand to Sit: Maximal Assistance - Patient 25-49%;2 Helpers Transfer (Assistive device): Rolling walker (Max A +1 with bed elevated, patient unable to lift his hips off the  bed x3 attempts. Provided cues for hand placement, forward weight shift, and rocking x3 for increased momentum.) Transfer via Lift Equipment: Stedy (Max +2 with heavy cues and encouragment for boosting up, came half way to standing before returning to sitting)  Instructed pt and his wife in results of PT evaluation as detailed above, PT POC, rehab potential, rehab goals, and discharge recommendations. Additionally discussed CIR's policies regarding fall safety and use of chair alarm and/or quick release belt. Pt verbalized understanding and in agreement. Will update pt's family members as they become available.   Obtained 20"x18" TIS w/c with hybrid foam/Roho cushion for increased sitting tolerance OOB during session.  Patient in bed with his wife at bedside at end of session with breaks locked, bed alarm set, and all needs within reach.   Discharge Criteria: Patient will be discharged from PT if patient refuses treatment 3 consecutive times without medical reason, if treatment goals not met, if there is a change in medical status, if patient makes no progress towards goals or if patient is discharged from hospital.  The above assessment, treatment plan, treatment alternatives and goals were discussed and mutually agreed upon: by patient and by family  Doreene Burke PT, DPT  05/14/2020, 2:28 PM

## 2020-05-14 NOTE — Progress Notes (Signed)
Upon entering the patient's room, he was yelling stating" I want you to call the police and send them by my house to check on my wife, she lives alone and I am worried about her, I have tried to call her and she is not answering the phone, the police can do a welfare check on her, she is not used to being alone". Informed patient that it is after midnight that his wife could possibly be asleep. Pt continued to yell for help and stated that we need to call his wife now to check on her and began trying to get out of bed, yelling and screaming. [name] called. Pt spoke with wife and after doing so he was able to calm down. Spoke with patient's wife about his concerns. Per patient's wife " he gets confused at night". Educated patient on fall prevention, and to call for assistance. Bed in low position, room clutter free, call bell within reach.

## 2020-05-14 NOTE — Evaluation (Signed)
Occupational Therapy Assessment and Plan  Patient Details  Name: [name] MRN: [id number] Date of Birth: Dec 25, 1945  OT Diagnosis: cognitive deficits, disturbance of vision and muscle weakness (generalized) Rehab Potential: Rehab Potential (ACUTE ONLY): Fair ELOS: 3-4 weeks   Today's Date: 05/14/2020 OT Individual Time: 4801-6553 OT Individual Time Calculation (min): 70 min     Hospital Problem: Principal Problem:   Physical debility   Past Medical History:  Past Medical History:  Diagnosis Date  . Anxiety   . Arthritis   . ASCVD (arteriosclerotic cardiovascular disease)    03/2004: DES x2 to the RCA; IMI with stent occlusion in 02/2005- suboptimal Plavix compliance  . CAD (coronary artery disease)   . Carcinoma of prostate ([place])    Clopidogrel held for biopsy  . Constipation due to pain medication   . Diabetes mellitus without complication ([place])   . Diabetic neuropathy ([place])   . Headache 2021  . Hyperlipidemia   . Hypertension   . Mild depression ([place])   . Morbid obesity ([place])   . Myocardial infarction ([place])   . Shortness of breath dyspnea   . Sleep apnea    no longer uses cpap  . Stroke ([place]) 2017  . Tobacco abuse    stopped smoking 06/28/09   Past Surgical History:  Past Surgical History:  Procedure Laterality Date  . APPLICATION OF CRANIAL NAVIGATION N/A 07/04/2019   Procedure: APPLICATION OF CRANIAL NAVIGATION;  Surgeon: Ashok Pall, MD;  Location: [facility];  Service: Neurosurgery;  Laterality: N/A;  . BACK SURGERY    . CARDIAC CATHETERIZATION     X 1 stent before having CABG  . CORONARY ARTERY BYPASS GRAFT  06/28/2008   X4  . CRANIOTOMY Right 07/04/2019   Procedure: Right occipital craniotomy for tumor with brainlab;  Surgeon: Ashok Pall, MD;  Location: [facility];  Service: Neurosurgery;  Laterality: Right;  . ENDARTERECTOMY Right 07/03/2015   Procedure: RIGHT CAROTID ENDARTERECTOMY WITH BOVINE PERICARDIUM PATCH ANGIOPLASTY;  Surgeon: Serafina Mitchell,  MD;  Location: [facility];  Service: Vascular;  Laterality: Right;  . LUMBAR LAMINECTOMY/DECOMPRESSION MICRODISCECTOMY N/A 09/06/2014   Procedure: LUMBER DECOMPRESSION L3-5;  Surgeon: Melina Schools, MD;  Location: [facility];  Service: Orthopedics;  Laterality: N/A;  . LUMBAR SPINE SURGERY  2002  . PROSTATE BIOPSY    . PROSTATECTOMY  02/2007  . VASECTOMY      Assessment & Plan Clinical Impression: [name] is a 75 year old male with history of ASCVD,T2DM, prostate cancer s/p prostatectomy, GBM s/p RSXN 06/2019, Covid 19 04/17/20, seizures who was admitted on 05/06/20 with fall due to recurrent seizures. Patient has failed chemo and salvage therapy was in process of being started. CT head showed residual masslike density right occipital lobe with similar edema and mass effect c/w 11/20201. GCS 3 at admission and he was loaded with Keppra Head and started on IV decadron. 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 He tolerated extubation without difficulty on 01/19 but had bouts of agitation. LT-EEG showed no further seizures but wife reported increase in agitation with increase in Keppra dose 12/21. Dr. Hortense Ramal recommended addition of Pyridoxine 100 mg daily for possible Keppra related agitation. Lethargy has resolved but he continues to be limited by weakness requiring increased level of assistance with basic ADL as well as Stedy lift with standing attempts. CIR was recommended due to functional decline.   Patient transferred to CIR on  05/13/2020 .    Patient currently requires max with basic self-care skills secondary to muscle weakness, decreased cardiorespiratoy endurance, decreased coordination and decreased motor planning, decreased visual acuity and peripheral field cuts, decreased initiation, decreased attention, decreased awareness, decreased problem solving, decreased safety awareness, decreased  memory and delayed processing and decreased sitting balance, decreased standing balance, decreased postural control and decreased balance strategies.  Prior to hospitalization, patient could complete ADLs with mod.  Patient will benefit from skilled intervention to decrease level of assist with basic self-care skills and increase level of independence with iADL prior to discharge home with care partner.  Anticipate patient will require 24 hour supervision and moderate physical assestance and follow up home health.  OT - End of Session Activity Tolerance: Tolerates 10 - 20 min activity with multiple rests Endurance Deficit: Yes OT Assessment Rehab Potential (ACUTE ONLY): Fair OT Patient demonstrates impairments in the following area(s): Balance;Perception;Behavior;Safety;Cognition;Skin Integrity;Endurance;Vision;Motor;Pain OT Basic ADL's Functional Problem(s): Eating;Bathing;Grooming;Dressing;Toileting OT Transfers Functional Problem(s): Toilet OT Additional Impairment(s): None OT Plan OT Intensity: Minimum of 1-2 x/day, 45 to 90 minutes OT Frequency: 5 out of 7 days OT Duration/Estimated Length of Stay: 3-4 weeks OT Treatment/Interventions: Balance/vestibular training;Discharge planning;Pain management;Self Care/advanced ADL retraining;Therapeutic Activities;UE/LE Coordination activities;Visual/perceptual remediation/compensation;Therapeutic Exercise;Skin care/wound managment;Patient/family education;Functional mobility training;Cognitive remediation/compensation;DME/adaptive equipment instruction;Psychosocial support;UE/LE Strength taining/ROM;Wheelchair propulsion/positioning OT Self Feeding Anticipated Outcome(s): set up assist OT Basic Self-Care Anticipated Outcome(s): mod A OT Toileting Anticipated Outcome(s): mod A OT Bathroom Transfers Anticipated Outcome(s): mod A OT Recommendation Recommendations for Other Services: Speech consult;Neuropsych consult Patient destination: Home Follow  Up Recommendations: Home health OT Equipment Recommended: To be determined   OT Evaluation Precautions/Restrictions  Precautions Precautions: Fall Precaution Comments: Peripheral visual field cut L>R, seizures Restrictions Weight Bearing Restrictions: No General Chart Reviewed: Yes Family/Caregiver Present: No Pain Pain Assessment Pain Scale: 0-10 Pain Score: 0-No pain Home Living/Prior Functioning Home Living Family/patient expects to be discharged to:: Private residence Living Arrangements: Spouse/significant other Available Help at Discharge: Family,Available 24 hours/day Type of Home: House Home Access: Stairs to enter [employer] of Steps: 3 Entrance Stairs-Rails: Right,Left,Can reach both Home Layout: Two level,Bed/bath upstairs Alternate Level Stairs-Number of Steps: has stair lift Alternate Level Stairs-Rails: Right,Left,Can reach both Bathroom Shower/Tub: Walk-in shower,Other (comment) (wife gives sponge baths) Bathroom Toilet: Handicapped height Bathroom Accessibility: Yes  Lives With: Spouse IADL History Homemaking Responsibilities: No Current License: No Prior Function Level of Independence: Needs assistance with ADLs,Requires assistive device for independence,Needs assistance with tranfers (per chart wife helping with all ADLs, RW used for transfers) Vision Baseline Vision/History: Wears glasses Wears Glasses: At all times Patient Visual Report: No change from baseline Vision Assessment?: Yes Eye Alignment: Within Functional Limits Ocular Range of Motion: Impaired-to be further tested in functional context Alignment/Gaze Preference: Within Defined Limits Tracking/Visual Pursuits: Decreased smoothness of eye movement to RIGHT inferior field;Decreased smoothness of eye movement to LEFT inferior field;Decreased smoothness of eye movement to RIGHT superior field;Decreased smoothness of eye movement to LEFT superior field;Requires cues, head turns, or  add eye shifts to track Saccades: Impaired - to be further tested in functional context;Decreased speed of saccadic movement Convergence: Within functional limits Perception  Perception: Impaired Spatial Orientation: impaired Praxis Praxis: Impaired Praxis Impairment Details: Initiation;Motor planning;Ideomotor Cognition Overall Cognitive Status: Impaired/Different from baseline Arousal/Alertness: Awake/alert Orientation Level: Person;Place Year: Other (Comment) [phone]) Month: January Day of Week: Incorrect Memory: Impaired Memory Impairment: Decreased short term memory;Decreased recall of new information Decreased Short Term Memory: Verbal basic Immediate Memory Recall: Sock;Blue;Bed Memory Recall Sock:  Not able to recall Memory Recall Blue: Not able to recall Memory Recall Bed: Not able to recall Attention: Sustained Sustained Attention: Impaired Sustained Attention Impairment: Verbal basic;Functional basic Awareness: Impaired Awareness Impairment: Intellectual impairment Problem Solving: Impaired Problem Solving Impairment: Verbal basic Executive Function:  (all impaired) Safety/Judgment: Impaired Sensation Sensation Light Touch: Appears Intact (reports it is intact, did not formally test) Coordination Gross Motor Movements are Fluid and Coordinated: No Fine Motor Movements are Fluid and Coordinated: No Coordination and Movement Description: generalized weakness, deficits in motor planning, and visualmotor deficits that impact coordination Finger Nose Finger Test: overshoots, slow Motor  Motor Motor: Other (comment) Motor - Skilled Clinical Observations: generalized weakness  Trunk/Postural Assessment  Cervical Assessment Cervical Assessment: Exceptions to [facility] (forward head) Thoracic Assessment Thoracic Assessment: Exceptions to [facility] (rounded shoulders, kyphotic posture) Lumbar Assessment Lumbar Assessment: Exceptions to [facility] (posterior pelvic tilt) Postural  Control Postural Control: Deficits on evaluation Trunk Control: inadequate EOB Righting Reactions: delayed, inadequate Protective Responses: delayed, inadequate  Balance Balance Balance Assessed: Yes Static Sitting Balance Static Sitting - Balance Support: Feet supported;Bilateral upper extremity supported Static Sitting - Level of Assistance: 2: Max assist Static Sitting - Comment/# of Minutes: pt requires max A to regain/establish sitting balance and then is able to maintain for several minutes with CGA before requiring aonther max A adjustment Dynamic Sitting Balance Dynamic Sitting - Balance Support: Feet supported Dynamic Sitting - Level of Assistance: 2: Max assist Extremity/Trunk Assessment RUE Assessment RUE Assessment: Exceptions to [facility] General Strength Comments: shoulder flexion to 80 degrees, 3/5 MMT LUE Assessment LUE Assessment: Exceptions to [facility] General Strength Comments: shoulder flexion to 80 degrees, 3/5 MMT  Care Tool Care Tool Self Care Eating   Eating Assist Level: Minimal Assistance - Patient > 75%    Oral Care    Oral Care Assist Level: Minimal Assistance - Patient > 75%    Bathing   Body parts bathed by patient: Right arm;Left arm;Chest;Abdomen;Face Body parts bathed by helper: Front perineal area;Buttocks;Right upper leg;Left upper leg;Right lower leg;Left lower leg   Assist Level: Maximal Assistance - Patient 24 - 49%    Upper Body Dressing(including orthotics)   What is the patient wearing?: Pull over shirt   Assist Level: Maximal Assistance - Patient 25 - 49%    Lower Body Dressing (excluding footwear)   What is the patient wearing?: Incontinence brief Assist for lower body dressing: Dependent - Patient 0%    Putting on/Taking off footwear   What is the patient wearing?: Non-skid slipper socks Assist for footwear: Dependent - Patient 0%       Care Tool Toileting Toileting activity Toileting Activity did not occur (Clothing management and  hygiene only): N/A (no void or bm)       Care Tool Bed Mobility Roll left and right activity   Roll left and right assist level: Maximal Assistance - Patient 25 - 49%    Sit to lying activity   Sit to lying assist level: Maximal Assistance - Patient 25 - 49%    Lying to sitting edge of bed activity   Lying to sitting edge of bed assist level: Maximal Assistance - Patient 25 - 49%     Care Tool Transfers Sit to stand transfer   Sit to stand assist level: Dependent - Patient 0%    Chair/bed transfer Chair/bed transfer activity did not occur: Safety/medical concerns       Toilet transfer Toilet transfer activity did not occur: Safety/medical concerns  Care Tool Cognition Expression of Ideas and Wants Expression of Ideas and Wants: Some difficulty - exhibits some difficulty with expressing needs and ideas (e.g, some words or finishing thoughts) or speech is not clear   Understanding Verbal and Non-Verbal Content Understanding Verbal and Non-Verbal Content: Sometimes understands - understands only basic conversations or simple, direct phrases. Frequently requires cues to understand   Memory/Recall Ability *first 3 days only Memory/Recall Ability *first 3 days only: Current season;That he or she is in a hospital/hospital unit    Refer to Care Plan for [place] 1 OT Short Term Goal 1 (Week 1): Pt will maintain EOB sitting balance with mod A OT Short Term Goal 2 (Week 1): Pt will set up grooming tasks with supervision to demo improved motor planning OT Short Term Goal 3 (Week 1): Pt will don shirt with mod A to demo improved sequencing OT Short Term Goal 4 (Week 1): Pt will require no more than min cueing to attend to dressing task  Recommendations for other services: Neuropsych   Skilled Therapeutic Intervention ADL ADL Eating: Minimal assistance Where Assessed-Eating: Bed level Grooming: Minimal assistance;Moderate cueing Where  Assessed-Grooming: Edge of bed Upper Body Bathing: Minimal assistance;Moderate cueing Where Assessed-Upper Body Bathing: Edge of bed Lower Body Bathing: Dependent;Moderate cueing Where Assessed-Lower Body Bathing: Bed level Upper Body Dressing: Maximal assistance;Moderate cueing Where Assessed-Upper Body Dressing: Edge of bed Lower Body Dressing: Dependent Where Assessed-Lower Body Dressing: Bed level Toileting: Unable to assess Toilet Transfer Method: Unable to assess Mobility  Bed Mobility Bed Mobility: Rolling Right;Rolling Left;Supine to Sit;Sit to Supine Rolling Right: Maximal Assistance - Patient 25-49% Rolling Left: Maximal Assistance - Patient 25-49% Supine to Sit: Maximal Assistance - Patient - Patient 25-49% Sit to Supine: Maximal Assistance - Patient 25-49% Transfers Sit to Stand:  (unable to come into full stand with max of 1)   OT eval completed as described above. Pt with cognitive, motor planning, balance, and visual deficits that all impact function. Pt required frequent cueing for attention to task, as well as for initiation and motor planning. He was able to maintain sitting balance EOB once max A correction and positioning was provided. Pt with fear of falling/anxiety likely contributing factor as well. Unable to advance past EOB d/t lack of +2 assist and pt fatigue. Pt also very cold throughout session. 4L O2 via [place] throughout ,no desaturations. Pt left supine, all needs met. Bed alarm set.    Discharge Criteria: Patient will be discharged from OT if patient refuses treatment 3 consecutive times without medical reason, if treatment goals not met, if there is a change in medical status, if patient makes no progress towards goals or if patient is discharged from hospital.  The above assessment, treatment plan, treatment alternatives and goals were discussed and mutually agreed upon: by patient  Curtis Sites 05/14/2020, 9:22 AM

## 2020-05-14 NOTE — Progress Notes (Signed)
Inpatient Rehabilitation Care Coordinator Assessment and Plan Patient Details  Name: [name] MRN: [id number] Date of Birth: August 01, 1945  Today's Date: 05/14/2020  Hospital Problems: Principal Problem:   Physical debility  Past Medical History:  Past Medical History:  Diagnosis Date  . Anxiety   . Arthritis   . ASCVD (arteriosclerotic cardiovascular disease)    03/2004: DES x2 to the RCA; IMI with stent occlusion in 02/2005- suboptimal Plavix compliance  . CAD (coronary artery disease)   . Carcinoma of prostate ([place])    Clopidogrel held for biopsy  . Constipation due to pain medication   . Diabetes mellitus without complication ([place])   . Diabetic neuropathy ([place])   . Headache 2021  . Hyperlipidemia   . Hypertension   . Mild depression ([place])   . Morbid obesity ([place])   . Myocardial infarction ([place])   . Shortness of breath dyspnea   . Sleep apnea    no longer uses cpap  . Stroke ([place]) 2017  . Tobacco abuse    stopped smoking 06/28/09   Past Surgical History:  Past Surgical History:  Procedure Laterality Date  . APPLICATION OF CRANIAL NAVIGATION N/A 07/04/2019   Procedure: APPLICATION OF CRANIAL NAVIGATION;  Surgeon: Ashok Pall, MD;  Location: [facility];  Service: Neurosurgery;  Laterality: N/A;  . BACK SURGERY    . CARDIAC CATHETERIZATION     X 1 stent before having CABG  . CORONARY ARTERY BYPASS GRAFT  06/28/2008   X4  . CRANIOTOMY Right 07/04/2019   Procedure: Right occipital craniotomy for tumor with brainlab;  Surgeon: Ashok Pall, MD;  Location: [facility];  Service: Neurosurgery;  Laterality: Right;  . ENDARTERECTOMY Right 07/03/2015   Procedure: RIGHT CAROTID ENDARTERECTOMY WITH BOVINE PERICARDIUM PATCH ANGIOPLASTY;  Surgeon: Serafina Mitchell, MD;  Location: [facility];  Service: Vascular;  Laterality: Right;  . LUMBAR LAMINECTOMY/DECOMPRESSION MICRODISCECTOMY N/A 09/06/2014   Procedure: LUMBER DECOMPRESSION L3-5;  Surgeon: Melina Schools, MD;  Location: [facility];  Service:  Orthopedics;  Laterality: N/A;  . LUMBAR SPINE SURGERY  2002  . PROSTATE BIOPSY    . PROSTATECTOMY  02/2007  . VASECTOMY     Social History:  reports that he quit smoking about 11 years ago. His smoking use included cigarettes. He has a 40.00 pack-year smoking history. He quit smokeless tobacco use about 10 years ago. He reports current alcohol use of about 3.0 standard drinks of alcohol per week. He reports that he does not use drugs.  Family / Support Systems Marital Status: Married How Long?: 41 years Patient Roles: Spouse,Parent Spouse/Significant Other: [name] (wife): [phone] (h); [phone] (c) Children: 4 adult children. 1 adult son lives locally- [name] Other Supports: None reported Anticipated Caregiver: wife and son [name] (PRN) Ability/Limitations of Caregiver: None reported. Caregiver Availability: 24/7 Family Dynamics: Pt lives with his wife.  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 Education: college grad Read: Yes Write: Yes Employment Status: Retired Date Retired/Disabled/Unemployed: 2020 Age Retired: 72 Public relations account executive Issues: Denies Guardian/Conservator: Denies   Abuse/Neglect Abuse/Neglect Assessment Can Be Completed: Yes Physical Abuse: Denies Verbal Abuse: Denies Sexual Abuse: Denies Exploitation of patient/patient's resources: Denies Self-Neglect: Denies  Emotional Status Pt's affect, behavior and adjustment status: Pt very tired during assessment. Recent Psychosocial Issues: Denies Psychiatric History: Denies Substance Abuse History: Denies  Patient / Family  Perceptions, Expectations & Goals Pt/Family understanding of illness & functional limitations: Pt wife has a general understanding of care needs Premorbid  pt/family roles/activities: Assistance with ADLs/IADLs ; wife was giving sponge baths. Anticipated changes in roles/activities/participation: Pt will continue to require support with care needs Pt/family expectations/goals: Goal is to improve hygiene since she believes he has lost confidence with the decline in his strength.  Community Resources [employer]s: None Premorbid Home Care/DME Agencies: None Transportation available at discharge: Wife Resource referrals recommended: Neuropsychology  Discharge Planning Living Arrangements: Spouse/significant other Support Systems: Spouse/significant other,Children Type of Residence: Private residence Insurance underwriter Resources: [employer] (specify) Web designer) Financial Resources: Therapist, art Screen Referred: No Living Expenses: Medical laboratory scientific officer Management: Spouse Does the patient have any problems obtaining your medications?: No Home Management: Wife manages all care needs Patient/Family Preliminary Plans: No changes Care Coordinator Barriers to Discharge: Decreased caregiver support,Lack of/limited family support Care Coordinator Barriers to Discharge Comments: LImited natural supports. Care Coordinator Anticipated Follow Up Needs: HH/OP  Clinical Impression SW familiar with this pt as previous patient. No changes with home environment. Pt wife as been patient primary caregiver. Intermittent support from their adult son [name] when he is available. DME: high rise toilet seat with arms and foot rest; stair lift, and walking stick.   SW provided resources for sitter list and caregiver support.   Chananya Canizalez A Ashvin Adelson 05/14/2020, 3:59 PM

## 2020-05-15 ENCOUNTER — Inpatient Hospital Stay (HOSPITAL_COMMUNITY): Payer: Medicare Other

## 2020-05-15 ENCOUNTER — Inpatient Hospital Stay (HOSPITAL_COMMUNITY): Payer: Medicare Other | Admitting: Speech Pathology

## 2020-05-15 LAB — GLUCOSE, CAPILLARY
Glucose-Capillary: 178 mg/dL — ABNORMAL HIGH (ref 70–99)
Glucose-Capillary: 123 mg/dL — ABNORMAL HIGH (ref 70–99)
Glucose-Capillary: 108 mg/dL — ABNORMAL HIGH (ref 70–99)
Glucose-Capillary: 138 mg/dL — ABNORMAL HIGH (ref 70–99)

## 2020-05-15 MED ORDER — LORAZEPAM 0.5 MG PO TABS: 0.5000 mg | ORAL_TABLET | Freq: Four times a day (QID) | ORAL | Status: DC | PRN

## 2020-05-15 MED ORDER — LORAZEPAM 0.5 MG PO TABS
0.5000 mg | ORAL_TABLET | Freq: Three times a day (TID) | ORAL | Status: DC | PRN
  Administered 2020-05-21 – 2020-06-07 (×25): 0.5 mg via ORAL
  Filled 2020-05-15 (×27): qty 1

## 2020-05-15 MED ORDER — QUETIAPINE FUMARATE 25 MG PO TABS
12.5000 mg | ORAL_TABLET | Freq: Every day | ORAL | Status: DC
  Administered 2020-05-15 – 2020-05-27 (×13): 12.5 mg via ORAL
  Filled 2020-05-15 (×13): qty 1

## 2020-05-15 NOTE — Progress Notes (Signed)
Physical Therapy Session Note  Patient Details  Name: [name] MRN: [id number] Date of Birth: 11-07-1945  Today's Date: 05/15/2020 PT Individual Time: 3662-9476 PT Individual Time Calculation (min): 45 min   Short Term Goals: Week 1:  PT Short Term Goal 1 (Week 1): Patient will perform bed mobility with mod A consistently. PT Short Term Goal 2 (Week 1): Patient will perform basic transfers with max A +2 using LRAD. PT Short Term Goal 3 (Week 1): Patient will sit OOB >1 hour 2x per day 4/7 days this week.  Skilled Therapeutic Interventions/Progress Updates:     Patient in restless and asking to get OOB with is wife and NT in the room upon PT arrival. Patient alert and agreeable to PT session. Patient reported un-rated sacral pain during session, RN made aware. PT provided repositioning, rest breaks, and distraction as pain interventions throughout session.   Per NT and patient's wife, patient with increased agitation today, worse during the last hour prior to session since changing rooms. Patient found to be incontinent of bladder in incontinence brief and [place] off on RA, SPO2 82%. Immediately placed [place] on patient set to 2L, recovered to >94% <2 min on 2 L/min. Pulse-ox noted HR to be 33 bpm. Noted HR to be irregular with manual pulse. RN called in, reports patient was maintaining O2 saturation earlier on RA >90% and patient with significant cardiovascular history. RN advised to maintain patient on 2L/min and monitor symptoms and vitals with mobility.   Patient's wife notes agitation to be worse today than other days with patient using threatening language toward her and staff and throwing things. Also reports patient attempting to get OOB several times today on his own and stated that he was very difficult to redirect today. MD made aware.  Therapeutic Activity: Bed Mobility: Patient performed rolling R/L with mod A of 1-2 people with use of bed rails while performing peri-care and  changing incontinence brief with total A. He performed supine to/from sit with max A +2 for trunk and lower extremity support. Provided verbal cues for initiation and sequencing, rolling to L to use his L arm to push up to sitting. Performed scooting up in bed x1 with total A +2. Patient sat EBO >4 min progressing from mod A with L posterior lean to supervision for sitting balance with verbal and visual cues for midline orientation. Donned/doffed B tennis shoes with total A with patient sitting EOB. Transfers: Patient performed pre-stand progressing from forward weight shift and trunk/hip flexion x5 with mod A for facilitation to lifting his hips off the bed x2 with max +2. Provided cues for forward weight shift and activation of glutes and quad to boost his hips up.   Educated patient's wife on factors contributing to patient's agitation and strategies to redirect rather than engage patient during times of confusion/agitation. Encouraged her to call in nursing staff to assess patient for stimuli for agitation (incontinence, pain, oxygen desaturation, etc.). Patient attentive and very appreciative of education. Patient attentive during education, but unable to recall episodes of agitation from earlier in the day. Did recall working with speech therapist.   Patient lying calmly in bed slightly rolled to the R for pressure relief with his wife at bedside at end of session with breaks locked, bed alarm set, and all needs within reach.    Therapy Documentation Precautions:  Precautions Precautions: Fall Precaution Comments: Peripheral visual field cut L>R, seizures Restrictions Weight Bearing Restrictions: No Other Position/Activity Restrictions: on 2-4  L/min O2   Therapy/Group: Individual Therapy  Berdina Cheever L Johanne Mcglade PT, DPT  05/15/2020, 9:10 PM

## 2020-05-15 NOTE — Progress Notes (Signed)
Occupational Therapy Session Note  Patient Details  Name: [name] MRN: [id number] Date of Birth: [birth year]  Today's Date: 05/15/2020 OT Individual Time: 1020-1100 OT Individual Time Calculation (min): 40 min    Short Term Goals: Week 1:  OT Short Term Goal 1 (Week 1): Pt will maintain EOB sitting balance with mod A OT Short Term Goal 2 (Week 1): Pt will set up grooming tasks with supervision to demo improved motor planning OT Short Term Goal 3 (Week 1): Pt will don shirt with mod A to demo improved sequencing OT Short Term Goal 4 (Week 1): Pt will require no more than min cueing to attend to dressing task  Skilled Therapeutic Interventions/Progress Updates:    Pt received supine with nursing staff present, stating that pt has been throwing items/pushing his tray over this morning. He was slightly agitated upon entry but with cueing for orientation and his wife arriving, he settled down. He requested to get OOB. He required increased time and cueing throughout session for initiation of movement. Max A to roll R for brief check of continence. Smear of BM so dependent hygiene provided and new brief donned. Pt required max +2 to transition from supine to sitting EOB. He demonstrated improved sitting balance EOB today and required only brief mod A to then stabilize and maintained with supervision for several minutes. [name] was used for sit > stand, pt requiring heavy +2 assist and several attempts d/t anxiety and poor body mechanics. He was transferred to the TIS w/c. He was trembling d/t being cold and was covered with several blankets. All VSS in chair. He was left sitting up with his wife assisting him in eating the rest of his breakfast.   Therapy Documentation Precautions:  Precautions Precautions: Fall Precaution Comments: Peripheral visual field cut L>R, seizures Restrictions Weight Bearing Restrictions: No Other Position/Activity Restrictions: on 2-4 L/min O2 Therapy/Group:  Individual Therapy  Curtis Sites 05/15/2020, 6:45 AM

## 2020-05-15 NOTE — Progress Notes (Signed)
[facility] PHYSICAL MEDICINE & REHABILITATION PROGRESS NOTE   Subjective/Complaints: Pt displayed confusion again last night with mild agitation. Was given ativan which reportedly helped. He is lying in bed appearing confused this morning  ROS: Limited due to cognitive/behavioral    Objective:   DG Chest 2 View  Result Date: 05/13/2020 CLINICAL DATA:  Hypoxia EXAM: CHEST - 2 VIEW COMPARISON:  05/06/2020 chest radiograph. FINDINGS: Intact sternotomy wires. Stable cardiomediastinal silhouette with normal heart size. No pneumothorax. No pleural effusion. Low lung volumes. Hazy opacities at both lung bases, left greater than right, slightly improved on the left. No pulmonary edema. IMPRESSION: Low lung volumes with hazy bibasilar lung opacities, left greater than right, slightly improved on the left, favor atelectasis. Electronically Signed   By: Ilona Sorrel M.D.   On: 05/13/2020 16:46   Recent Labs    05/14/20 0559  WBC 3.7*  HGB 11.2*  HCT 33.7*  PLT 124*   Recent Labs    05/14/20 0559  NA 135  K 4.1  CL 96*  CO2 30  GLUCOSE 231*  BUN 11  CREATININE 0.56*  CALCIUM 8.5*    Intake/Output Summary (Last 24 hours) at 05/15/2020 0800 Last data filed at 05/14/2020 2311 Gross per 24 hour  Intake 800 ml  Output 851 ml  Net -51 ml     Pressure Injury 05/14/20 Sacrum Mid Unstageable - Full thickness tissue loss in which the base of the injury is covered by slough (yellow, tan, gray, green or brown) and/or eschar (tan, brown or black) in the wound bed. previously documented as stage 2,  (Active)  05/14/20 0132  Location: Sacrum  Location Orientation: Mid  Staging: Unstageable - Full thickness tissue loss in which the base of the injury is covered by slough (yellow, tan, gray, green or brown) and/or eschar (tan, brown or black) in the wound bed.  Wound Description (Comments): previously documented as stage 2, now unsteagable with with soft black tissue covering wound base  Present  on Admission: Yes    Physical Exam: Vital Signs Blood pressure 140/88, pulse (!) 107, temperature 97.8 F (36.6 C), resp. rate 20, height 5\' 11"  (1.803 m), weight 96 kg, SpO2 92 %.  Constitutional: No distress . Vital signs reviewed. HEENT: EOMI, oral membranes moist Neck: supple Cardiovascular: RRR without murmur. No JVD    Respiratory/Chest: CTA Bilaterally without wheezes or rales. Normal effort    GI/Abdomen: BS +, non-tender, non-distended Ext: no clubbing, cyanosis, or edema Psych: confused Skin: sacral stage II/unstageable, right heel redness, boggy--chronic, mild bogginess left heel Neuro: awake but confused. Cranial nerves 2-12 are intact. Sensory exam is normal. Reflexes are 1+ in all 4's. Motor 3/5 RUE and 2+ to 3/5 LUE, BLE grossly 2/5 prox to 3+/5 distally---motor exam inconsistent this morning  Musculoskeletal: Full ROM, No pain with AROM or PROM in the neck, trunk, or extremities.       Assessment/Plan: 1. Functional deficits which require 3+ hours per day of interdisciplinary therapy in a comprehensive inpatient rehab setting.  Physiatrist is providing close team supervision and 24 hour management of active medical problems listed below.  Physiatrist and rehab team continue to assess barriers to discharge/monitor patient progress toward functional and medical goals  Care Tool:  Bathing    Body parts bathed by patient: Right arm,Left arm,Chest,Abdomen,Face   Body parts bathed by helper: Front perineal area,Buttocks,Right upper leg,Left upper leg,Right lower leg,Left lower leg     Bathing assist Assist Level: Maximal Assistance - Patient 24 -  49%     Upper Body Dressing/Undressing Upper body dressing   What is the patient wearing?: Pull over shirt    Upper body assist Assist Level: Maximal Assistance - Patient 25 - 49%    Lower Body Dressing/Undressing Lower body dressing      What is the patient wearing?: Incontinence brief     Lower body assist  Assist for lower body dressing: Dependent - Patient 0%     Toileting Toileting Toileting Activity did not occur (Clothing management and hygiene only): N/A (no void or bm)  Toileting assist Assist for toileting: Moderate Assistance - Patient 50 - 74%     Transfers Chair/bed transfer  Transfers assist  Chair/bed transfer activity did not occur: Safety/medical concerns (decreased strength/activity tolerance)        Locomotion Ambulation   Ambulation assist   Ambulation activity did not occur: Safety/medical concerns (decreased strength/activity tolerance)          Walk 10 feet activity   Assist  Walk 10 feet activity did not occur: Safety/medical concerns        Walk 50 feet activity   Assist Walk 50 feet with 2 turns activity did not occur: Safety/medical concerns         Walk 150 feet activity   Assist Walk 150 feet activity did not occur: Safety/medical concerns         Walk 10 feet on uneven surface  activity   Assist Walk 10 feet on uneven surfaces activity did not occur: Safety/medical concerns         Wheelchair     Assist     Wheelchair activity did not occur: Safety/medical concerns (decreased strength/activity tolerance to transfer to w/c on eval)         Wheelchair 50 feet with 2 turns activity    Assist    Wheelchair 50 feet with 2 turns activity did not occur: Safety/medical concerns       Wheelchair 150 feet activity     Assist  Wheelchair 150 feet activity did not occur: Safety/medical concerns       Blood pressure 140/88, pulse (!) 107, temperature 97.8 F (36.6 C), resp. rate 20, height 5\' 11"  (1.803 m), weight 96 kg, SpO2 92 %.  Medical Problem List and Plan: 1.Functional and mobility deficitssecondary to debility after recurrent seizures and associated respiratory failure. Resolving encephalopathy.  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 Antithrombotics: -DVT/anticoagulation:Pharmaceutical:Heparin -antiplatelet therapy: N/a 3. Pain Management:Tylenol prn 4. Mood:LCSW to follow for evaluation and support. -antipsychotic agents: will add low dose seroquel to help with sundowning 5. Neuropsych: This patientis not fullycapable of making decisions onhisown behalf. 6. Skin/Wound Care:Routine pressure relief measures. -prevalon boots for bilateral heels -encourage adequate PO intake 7. Fluids/Electrolytes/Nutrition:encourage PO  -low protein state   -offering protein supps  8. HTN: Monitor BP tid--continue Norvasc and metoprolol  -fair control 9. Steroid induced hyperglycemia: Monitor BS ac/hs and use SSI for elevated BS.  -if plan is to continue decadron, he'll need to be on something scheduled to better control CBG's   -began amaryl 1mg  daily 1/25--observe for pattern 10. GBM/Seizures: Has been seizure free on Keppra 2000 mg bid.  -continue decadron 2mg  in am and 4mg  in pm  -followed by Dr. Rosanne Ashing wants to continue treatments after discharge 11. Bouts of agitation: Have resolved-->changed Pyridoxine to oral route. Continue to use  ativan prn anxiety---limit benzos as possible 12. Hypoxia: Currently on 2 L per [place]  ---continue oxygen per [place]. -CXR with atelectasis and low lung volumes -need to encourage IS, deep breathing, OOB     LOS: 2 days A FACE TO FACE EVALUATION WAS PERFORMED  Meredith Staggers 05/15/2020, 8:00 AM

## 2020-05-15 NOTE — Progress Notes (Signed)
Pt disoriented through the night to place, time, and situation, but pt displayed no agitation through the night after PRN ativan.

## 2020-05-15 NOTE — Progress Notes (Signed)
Patient had multiple episodes of agitation but was redirectable  With the help of wife at the bedside. We continue to monitoe.

## 2020-05-15 NOTE — Progress Notes (Signed)
Occupational Therapy Session Note  Patient Details  Name: [name] MRN: [id number] Date of Birth: [birth year]  Today's Date: 05/15/2020 OT Individual Time: 2878-6767 OT Individual Time Calculation (min): 57 min    Short Term Goals: Week 1:  OT Short Term Goal 1 (Week 1): Pt will maintain EOB sitting balance with mod A OT Short Term Goal 2 (Week 1): Pt will set up grooming tasks with supervision to demo improved motor planning OT Short Term Goal 3 (Week 1): Pt will don shirt with mod A to demo improved sequencing OT Short Term Goal 4 (Week 1): Pt will require no more than min cueing to attend to dressing task  Skilled Therapeutic Interventions/Progress Updates:     Pt received inbed with no pain reported. Pt able to recall in [place], [place].  On 2L o2 at beginning of session pt 98% sat and pulse 75; 96% on 2L o2 at EOB midway throgh session HR 85.   ADL:  Pt completes UB bathing with max encouragement to wash  Chest/B armpits with MIN A overall fot sitting balance intially  at EOB. Increasing A to min-MOD A for sitting balance and requires MAX A to don shirt. Pt with improved sitting EOB after WB into L elbow to decrease lean to R. Pt with improved donning of shirt by threading head first however unable to motor plan pulling sleeves over bracelets (hospital) Pt completes LB dressing with total A overall at bed level with increased time and MOD A for rolling in B directions to advance pants past hips and MAX A to push up to sit up at EOB in prep for UB bathing Pt completes footwear with total A to don B socks and shoes. Oral care with MOD A for loading toothpaste onto toothbrush d/t L visual/motor planning deficits with HOH A at bed level.  Pt left at end of session in bed with exit alarm on, call light in reach and all needs met   Therapy Documentation Precautions:  Precautions Precautions: Fall Precaution Comments: Peripheral visual field cut L>R, seizures Restrictions Weight  Bearing Restrictions: No Other Position/Activity Restrictions: on 2-4 L/min O2 General:   Vital Signs: Therapy Vitals Temp: 97.8 F (36.6 C) Pulse Rate: (!) 107 Resp: 20 BP: 140/88 Patient Position (if appropriate): Lying Oxygen Therapy SpO2: 92 % O2 Device: Nasal Cannula O2 Flow Rate (L/min): 2 L/min Pain:   ADL: ADL Eating: Minimal assistance Where Assessed-Eating: Bed level Grooming: Minimal assistance,Moderate cueing Where Assessed-Grooming: Edge of bed Upper Body Bathing: Minimal assistance,Moderate cueing Where Assessed-Upper Body Bathing: Edge of bed Lower Body Bathing: Dependent,Moderate cueing Where Assessed-Lower Body Bathing: Bed level Upper Body Dressing: Maximal assistance,Moderate cueing Where Assessed-Upper Body Dressing: Edge of bed Lower Body Dressing: Dependent Where Assessed-Lower Body Dressing: Bed level Toileting: Unable to assess Toilet Transfer Method: Unable to assess Vision   Perception    Praxis   Exercises:   Other Treatments:     Therapy/Group: Individual Therapy  Tonny Branch 05/15/2020, 6:54 AM

## 2020-05-15 NOTE — Evaluation (Signed)
Speech Language Pathology Assessment and Plan  Patient Details  Name: [name] MRN: [id number] Date of Birth: 1945/07/13  SLP Diagnosis: Voice disorder;Speech and Language deficits;Cognitive Impairments  Rehab Potential: Good ELOS: 3-4 weeks    Today's Date: 05/15/2020 SLP Individual Time: 0539-7673 SLP Individual Time Calculation (min): 55 min   Hospital Problem: Principal Problem:   Physical debility  Past Medical History:  Past Medical History:  Diagnosis Date  . Anxiety   . Arthritis   . ASCVD (arteriosclerotic cardiovascular disease)    03/2004: DES x2 to the RCA; IMI with stent occlusion in 02/2005- suboptimal Plavix compliance  . CAD (coronary artery disease)   . Carcinoma of prostate ([place])    Clopidogrel held for biopsy  . Constipation due to pain medication   . Diabetes mellitus without complication ([place])   . Diabetic neuropathy ([place])   . Headache 2021  . Hyperlipidemia   . Hypertension   . Mild depression ([place])   . Morbid obesity ([place])   . Myocardial infarction ([place])   . Shortness of breath dyspnea   . Sleep apnea    no longer uses cpap  . Stroke ([place]) 2017  . Tobacco abuse    stopped smoking 06/28/09   Past Surgical History:  Past Surgical History:  Procedure Laterality Date  . APPLICATION OF CRANIAL NAVIGATION N/A 07/04/2019   Procedure: APPLICATION OF CRANIAL NAVIGATION;  Surgeon: Ashok Pall, MD;  Location: [facility];  Service: Neurosurgery;  Laterality: N/A;  . BACK SURGERY    . CARDIAC CATHETERIZATION     X 1 stent before having CABG  . CORONARY ARTERY BYPASS GRAFT  06/28/2008   X4  . CRANIOTOMY Right 07/04/2019   Procedure: Right occipital craniotomy for tumor with brainlab;  Surgeon: Ashok Pall, MD;  Location: [facility];  Service: Neurosurgery;  Laterality: Right;  . ENDARTERECTOMY Right 07/03/2015   Procedure: RIGHT CAROTID ENDARTERECTOMY WITH BOVINE PERICARDIUM PATCH ANGIOPLASTY;  Surgeon: Serafina Mitchell, MD;  Location: [facility];  Service:  Vascular;  Laterality: Right;  . LUMBAR LAMINECTOMY/DECOMPRESSION MICRODISCECTOMY N/A 09/06/2014   Procedure: LUMBER DECOMPRESSION L3-5;  Surgeon: Melina Schools, MD;  Location: [facility];  Service: Orthopedics;  Laterality: N/A;  . LUMBAR SPINE SURGERY  2002  . PROSTATE BIOPSY    . PROSTATECTOMY  02/2007  . VASECTOMY      Assessment / Plan / Recommendation Clinical Impression   [name] is a 75 year old male with history of ASCVD,T2DM, prostate cancer s/p prostatectomy, GBM s/p RSXN 06/2019, Covid 19 04/17/20, seizures who was admitted on 05/06/20 with fall due to recurrent seizures. Patient has failed chemo and salvage therapy was in process of being started. CT head showed residual masslike density right occipital lobe with similar edema and mass effect c/w 11/20201. GCS 3 at admission and he was loaded with Keppra Head and started on IV decadron. 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 He tolerated extubation without difficulty on 01/19 but had bouts of agitation. LT-EEG showed no further seizures but wife reported increase in agitation with increase in Keppra dose 12/21. Dr. Hortense Ramal recommended addition of Pyridoxine 100 mg daily for possible Keppra related agitation. Lethargy has resolved but he continues to be limited by weakness requiring increased level of assistance with basic ADL as well as Stedy lift with standing attempts. Pt was admitted to the CIR program at [facility] on 05/13/20 and SLP evaluations were completed 05/15/20  with results as follows:  Pt presents with moderately severe cognitive impairments marked by intermittent confusion, disorientation to time and situation, as well as sustained attention, basic familiar problem solving, intellectual awareness, and short term memory. As a result, he currently requires Max-Total A for recall and carryover of functional daily  information, both related to therapies and safety precautions. He was unable to participate in a standardized assessment today due to level of distractibility and some verbal agitation. Pt was oriented to self and place only. His ability to functionally problem solve basic tasks such repositioning, use of call bell, etc. was severely compromised and he was only able to sustain attention to tasks for 30-90 seconds before cueing required for redirection. [name] at bedside reported concern for increase in verbal agitation. Education provided regarding impact of brain injury and medications on levels of agitation and frustration tolerance. Pt also exhibited repetition of words and phrases at times throughout evaluation, which SLP suspects may be more related to cognition than motor speech. However, will continue diagnostic treatment to differentiate potential onset of neurogenic stutter VS cognitive impairment impacting speech. His voice is also low in intensity and hoarse, suspect due to recent intubation. Will monitor vocal quality and potential need for referral to ENT to assess vocal fold function.   Due to RN's concerns for difficulty taking medications, bedside swallow evaluation also conducted. Pt consumed a regular texture snack (peanut butter and graham crackers) and thin liquids via straw without overt s/sx aspiration. His mastication was efficient and full oral clearance was achieved. He did require cueing for sustained attention and problem solving during self feeding, and assistance with safe positioning in bed for PO consumption, however no evidence of an oral or pharyngeal dysphagia present. Due to cognitive impairments, would recommend full supervision for meals, however no skilled ST is indicated for dysphagia.   Recommend pt receive skilled ST services to address speech and cognitive impairments as described above in order to maximize his functional independence, safety, and communication prior  to discharge.    Skilled Therapeutic Interventions          Bedside swallow and cognitive-linguistic evaluations were administered and results were reviewed with pt (please see above for details regarding the results).   SLP Assessment  Patient will need skilled Speech Lanaguage Pathology Services during CIR admission    Recommendations  SLP Diet Recommendations: Age appropriate regular solids;Thin Liquid Administration via: Cup;Straw Medication Administration: Whole meds with liquid Supervision: Patient able to self feed;Full supervision/cueing for compensatory strategies (full supervision requirement due to cognition) Compensations: Minimize environmental distractions;Slow rate;Small sips/bites Postural Changes and/or Swallow Maneuvers: Seated upright 90 degrees Oral Care Recommendations: Oral care BID Recommendations for Other Services: Neuropsych consult Patient destination: Home Follow up Recommendations: Home Health SLP;24 hour supervision/assistance Equipment Recommended: None recommended by SLP    SLP Frequency 3 to 5 out of 7 days   SLP Duration  SLP Intensity  SLP Treatment/Interventions 3-4 weeks  Minumum of 1-2 x/day, 30 to 90 minutes  Cognitive remediation/compensation;Cueing hierarchy;Internal/external aids;Speech/Language facilitation;Therapeutic Activities;Patient/family education;Functional tasks    Pain Pain Assessment Pain Scale: Faces Faces Pain Scale: Hurts a little bit Pain Location: Generalized Pain Descriptors / Indicators: Grimacing;Discomfort;Restless Pain Onset: On-going Pain Intervention(s): Repositioned;Distraction Multiple Pain Sites: No     SLP Evaluation Cognition Overall Cognitive Status: Impaired/Different from baseline Arousal/Alertness: Awake/alert Orientation Level: Oriented to person;Oriented to place;Disoriented to time;Disoriented to situation Attention: Sustained Sustained Attention: Impaired Sustained Attention Impairment:  Verbal basic;Functional basic Memory: Impaired Memory Impairment:  Decreased short term memory;Decreased recall of new information Decreased Short Term Memory: Verbal basic;Functional basic Awareness: Impaired Awareness Impairment: Intellectual impairment Problem Solving: Impaired Problem Solving Impairment: Verbal basic;Functional basic Executive Function:  (all impaired due to lower level deficits) Safety/Judgment: Impaired  Comprehension Auditory Comprehension Overall Auditory Comprehension: Appears within functional limits for tasks assessed Conversation: Simple Visual Recognition/Discrimination Discrimination: Not tested Reading Comprehension Reading Status: Not tested Expression Expression Primary Mode of Expression: Verbal Verbal Expression Overall Verbal Expression: Appears within functional limits for tasks assessed Level of Generative/Spontaneous Verbalization: Sentence Non-Verbal Means of Communication: Not applicable Written Expression Written Expression: Not tested Oral Motor Oral Motor/Sensory Function Overall Oral Motor/Sensory Function: Within functional limits Motor Speech Overall Motor Speech: Impaired Respiration: Impaired Level of Impairment: Sentence Phonation: Hoarse;Breathy;Low vocal intensity Resonance: Within functional limits Articulation: Within functional limitis Intelligibility: Intelligible Motor Planning: Witnin functional limits Motor Speech Errors: Not applicable (pt did repeat some words and sentences - question neurogenic stutter vs cognitive impairment impacting speech)  Care Tool Care Tool Cognition Expression of Ideas and Wants Expression of Ideas and Wants: Some difficulty - exhibits some difficulty with expressing needs and ideas (e.g, some words or finishing thoughts) or speech is not clear   Understanding Verbal and Non-Verbal Content Understanding Verbal and Non-Verbal Content: Sometimes understands - understands only basic  conversations or simple, direct phrases. Frequently requires cues to understand   Memory/Recall Ability *first 3 days only Memory/Recall Ability *first 3 days only: That he or she is in a hospital/hospital unit      Intelligibility: Intelligible  Bedside Swallowing Assessment General Date of Onset: 05/06/20 Previous Swallow Assessment: none found in chart Diet Prior to this Study: Regular;Thin liquids Temperature Spikes Noted: No Respiratory Status: Room air History of Recent Intubation: Yes Length of Intubations (days): 3 days Date extubated: 05/08/20 Behavior/Cognition: Alert;Distractible;Requires cueing;Other (Comment) (intermittent verbal agitation, but able to be redirected) Oral Cavity - Dentition: Adequate natural dentition Self-Feeding Abilities: Able to feed self;Needs set up Patient Positioning: Upright in bed Baseline Vocal Quality: Hoarse;Low vocal intensity;Breathy Volitional Cough: Weak Volitional Swallow: Able to elicit  Oral Care Assessment Does patient have any of the following "high(er) risk" factors?: None of the above Does patient have any of the following "at risk" factors?: None of the above Patient is LOW RISK: Follow universal precautions (see row information) Ice Chips Ice chips: Not tested Thin Liquid Thin Liquid: Within functional limits Presentation: Self Fed;Straw Nectar Thick Nectar Thick Liquid: Not tested Honey Thick Honey Thick Liquid: Not tested Puree Puree: Within functional limits Solid Solid: Within functional limits Presentation: Self Fed BSE Assessment Risk for Aspiration Impact on safety and function: Mild aspiration risk Other Related Risk Factors: Prolonged intubation;Cognitive impairment  Short Term Goals: Week 1: SLP Short Term Goal 1 (Week 1): Pt will sustain attention to functional tasks for 3-5 minute intervals with Max A cues for redirection. SLP Short Term Goal 2 (Week 1): Pt will demonstrate recall of 1 safety  precaution with Max A cues for use of strategies or aids. SLP Short Term Goal 3 (Week 1): Pt will orient to date, place, and situation with Max A cues for use of external aids or other strategies. SLP Short Term Goal 4 (Week 1): Pt will identify 1 cognitive and 1 physical impairment in setting of acute hospitalization with Max A multimodal cues. SLP Short Term Goal 5 (Week 1): Pt will use strategies for speech fluency/reducing verbal repetitions with Max A multimodal cues.  Refer to Care Plan for Long Term  Goals  Recommendations for other services: Neuropsych  Discharge Criteria: Patient will be discharged from SLP if patient refuses treatment 3 consecutive times without medical reason, if treatment goals not met, if there is a change in medical status, if patient makes no progress towards goals or if patient is discharged from hospital.  The above assessment, treatment plan, treatment alternatives and goals were discussed and mutually agreed upon: by patient and his wife  Arbutus Leas 05/15/2020, 2:17 PM

## 2020-05-15 NOTE — Care Management (Signed)
[facility] Individual Statement of Services  Patient Name:  [name]  Date:  05/15/2020  Welcome to the [facility].  Our goal is to provide you with an individualized program based on your diagnosis and situation, designed to meet your specific needs.  With this comprehensive rehabilitation program, you will be expected to participate in at least 3 hours of rehabilitation therapies Monday-Friday, with modified therapy programming on the weekends.  Your rehabilitation program will include the following services:  Physical Therapy (PT), Occupational Therapy (OT), 24 hour per day rehabilitation nursing, Therapeutic Recreaction (TR), Psychology, Neuropsychology, Care Coordinator, Rehabilitation Medicine, Nutrition Services, Pharmacy Services and Other  Weekly team conferences will be held on Tuesdays to discuss your progress.  Your Inpatient Rehabilitation Care Coordinator will talk with you frequently to get your input and to update you on team discussions.  Team conferences with you and your family in attendance may also be held.  Expected length of stay: 3-4 weeks   Overall anticipated outcome: Moderate Assistance  Depending on your progress and recovery, your program may change. Your Inpatient Rehabilitation Care Coordinator will coordinate services and will keep you informed of any changes. Your Inpatient Rehabilitation Care Coordinator's name and contact numbers are listed  below.  The following services may also be recommended but are not provided by the [facility] will be made to provide these services after discharge if needed.  Arrangements include referral to agencies that provide these services.  Your insurance has been verified to be:  Medicare A/B  Your primary doctor  is:  Maurice Small  Pertinent information will be shared with your doctor and your insurance company.  Inpatient Rehabilitation Care Coordinator:  Cathleen Corti [phone] or [phone]  Information discussed with and copy given to patient by: Rana Snare, 05/15/2020, 10:16 AM

## 2020-05-15 NOTE — Progress Notes (Signed)
   05/15/20 1405  Clinical Encounter Type  Visited With Family (Spoke with wife on the phone)  Visit Type Spiritual support  Referral From Nurse  Consult/Referral To Chaplain  Chaplain responded to consult. Chaplain called the patient's room and spoke with Mrs.Brander. She stated patient is with the nurses. She stated the patient will be moving to room 4W11. Advise a chaplain will follow up. This note was prepared by Jeanine Luz, M.Div..  For questions please contact by phone [phone].

## 2020-05-16 ENCOUNTER — Inpatient Hospital Stay (HOSPITAL_COMMUNITY): Payer: Medicare Other

## 2020-05-16 DIAGNOSIS — R569 Unspecified convulsions: Secondary | ICD-10-CM

## 2020-05-16 DIAGNOSIS — R5383 Other fatigue: Secondary | ICD-10-CM

## 2020-05-16 LAB — CBC
HCT: 35.7 % — ABNORMAL LOW (ref 39.0–52.0)
Hemoglobin: 11.8 g/dL — ABNORMAL LOW (ref 13.0–17.0)
MCH: 29.7 pg (ref 26.0–34.0)
MCHC: 33.1 g/dL (ref 30.0–36.0)
MCV: 89.9 fL (ref 80.0–100.0)
Platelets: 120 10*3/uL — ABNORMAL LOW (ref 150–400)
RBC: 3.97 MIL/uL — ABNORMAL LOW (ref 4.22–5.81)
RDW: 14.4 % (ref 11.5–15.5)
WBC: 6.9 10*3/uL (ref 4.0–10.5)
nRBC: 0 % (ref 0.0–0.2)

## 2020-05-16 LAB — URINALYSIS, COMPLETE (UACMP) WITH MICROSCOPIC
Glucose, UA: NEGATIVE mg/dL
Ketones, ur: NEGATIVE mg/dL
Nitrite: POSITIVE — AB
Protein, ur: 30 mg/dL — AB
Specific Gravity, Urine: 1.018 (ref 1.005–1.030)
pH: 6 (ref 5.0–8.0)

## 2020-05-16 LAB — BASIC METABOLIC PANEL
Anion gap: 10 (ref 5–15)
BUN: 16 mg/dL (ref 8–23)
CO2: 27 mmol/L (ref 22–32)
Calcium: 8.5 mg/dL — ABNORMAL LOW (ref 8.9–10.3)
Chloride: 102 mmol/L (ref 98–111)
Creatinine, Ser: 0.56 mg/dL — ABNORMAL LOW (ref 0.61–1.24)
GFR, Estimated: >60 mL/min (ref >60)
Glucose, Bld: 122 mg/dL — ABNORMAL HIGH (ref 70–99)
Potassium: 3.6 mmol/L (ref 3.5–5.1)
Sodium: 139 mmol/L (ref 135–145)

## 2020-05-16 LAB — GLUCOSE, CAPILLARY
Glucose-Capillary: 194 mg/dL — ABNORMAL HIGH (ref 70–99)
Glucose-Capillary: 116 mg/dL — ABNORMAL HIGH (ref 70–99)
Glucose-Capillary: 126 mg/dL — ABNORMAL HIGH (ref 70–99)

## 2020-05-16 MED ORDER — LEVETIRACETAM 100 MG/ML PO SOLN
2000.0000 mg | Freq: Two times a day (BID) | ORAL | Status: DC
  Administered 2020-05-16 – 2020-06-11 (×52): 2000 mg via ORAL
  Filled 2020-05-16 (×54): qty 20

## 2020-05-16 MED ORDER — SODIUM CHLORIDE 0.45 % IV SOLN
INTRAVENOUS | Status: DC
  Administered 2020-05-16: 1 mL via INTRAVENOUS

## 2020-05-16 MED ORDER — SODIUM CHLORIDE 0.45 % NICU IV INFUSION SIMPLE
INJECTION | INTRAVENOUS | Status: DC
  Filled 2020-05-16 (×3): qty 500

## 2020-05-16 NOTE — Progress Notes (Addendum)
Pt has gone down to xray, CT called prior to transport when picked up by xray. Asked transport to communicate to CT as they are ready for pt.   Pt returned from xray; still awaiting transport to CT

## 2020-05-16 NOTE — Plan of Care (Incomplete)
Behavioral Plan   [place] Level: n/a  Behavior to decrease/ eliminate:  -agitation -confusion -attempts to get out of bed -   Changes to environment:  -  Interventions: -Telesitter   Recommendations for interactions with patient:   Attendees:

## 2020-05-16 NOTE — IPOC Note (Signed)
Overall Plan of Care [facility]) Patient Details Name: [name] MRN: [id number] DOB: May 15, 1945  Admitting Diagnosis: Physical debility, hx GBM  Hospital Problems: Principal Problem:   Physical debility     Functional Problem List: Nursing Bladder,Bowel,Endurance,Medication Management,Pain,Nutrition,Safety,Skin Integrity  PT Balance,Behavior,Edema,Endurance,Motor,Sensory,Safety,Perception,Pain,Nutrition,Skin Integrity  OT Balance,Perception,Behavior,Safety,Cognition,Skin Integrity,Endurance,Vision,Motor,Pain  SLP Cognition,Linguistic  TR         Basic ADL's: OT Eating,Bathing,Grooming,Dressing,Toileting     Advanced  ADL's: OT       Transfers: PT Bed Mobility,Bed to [employer]  OT Toilet     Locomotion: PT Ambulation,Wheelchair Mobility,Stairs     Additional Impairments: OT None  SLP Communication,Social Cognition expression Problem Solving,Memory,Attention,Awareness,Social Interaction  TR      Anticipated Outcomes Item Anticipated Outcome  Self Feeding set up assist  Swallowing      Basic self-care  mod A  Toileting  mod A   Bathroom Transfers mod A  Bowel/Bladder  min-mod assist  Transfers  mod A using LRAD  Locomotion  mod A 25 ft using LRAD  Communication  Min A  Cognition  Min A  Pain  <4  Safety/Judgment  min-mod assist   Therapy Plan: PT Intensity: Minimum of 1-2 x/day ,45 to 90 minutes PT Frequency: 5 out of 7 days PT Duration Estimated Length of Stay: 3-4 weeks OT Intensity: Minimum of 1-2 x/day, 45 to 90 minutes OT Frequency: 5 out of 7 days OT Duration/Estimated Length of Stay: 3-4 weeks SLP Intensity: Minumum of 1-2 x/day, 30 to 90 minutes SLP Frequency: 3 to 5 out of 7 days SLP Duration/Estimated Length of Stay: 3-4 weeks   Due to the current state of emergency, patients may not be receiving their 3-hours of Medicare-mandated therapy.   Team Interventions: Nursing Interventions Patient/Family Education,Bladder  Management,Bowel Management,Disease Management/Prevention,Pain Management,Medication Management,Skin Care/Wound Management,Discharge Planning,Psychosocial Support  PT interventions Ambulation/gait training,Cognitive remediation/compensation,Discharge planning,DME/adaptive equipment instruction,Functional mobility training,Pain management,Psychosocial support,Splinting/orthotics,Therapeutic Activities,UE/LE Strength taining/ROM,Visual/perceptual remediation/compensation,Wheelchair propulsion/positioning,UE/LE Coordination activities,Therapeutic Exercise,Stair training,Skin care/wound management,Patient/family education,Neuromuscular re-education,Functional Insurance risk surveyor  OT Interventions Balance/vestibular training,Discharge planning,Pain management,Self Care/advanced ADL retraining,Therapeutic Activities,UE/LE Coordination activities,Visual/perceptual remediation/compensation,Therapeutic Exercise,Skin care/wound managment,Patient/family education,Functional mobility training,Cognitive remediation/compensation,DME/adaptive equipment instruction,Psychosocial support,UE/LE Strength taining/ROM,Wheelchair propulsion/positioning  SLP Interventions Cognitive remediation/compensation,Cueing hierarchy,Internal/external aids,Speech/Language facilitation,Therapeutic Activities,Patient/family education,Functional tasks  TR Interventions    SW/CM Interventions Discharge Planning,Psychosocial Support,Patient/Family Education   Barriers to Discharge MD  Medical stability  Nursing Inaccessible home environment,Decreased caregiver support,Home environment access/layout,Incontinence,Wound Care,Lack of/limited family support,Weight,Medication compliance,Pending chemo/radiation,Nutrition means    PT Inaccessible home environment,Incontinence,Lack of/limited family support,Behavior,New oxygen    OT      SLP      SW Decreased  caregiver support,Lack of/limited family support LImited natural supports.   Team Discharge Planning: Destination: PT-Home ,OT- Home , SLP-Home Projected Follow-up: PT-Home health PT, OT-  Home health OT, SLP-Home Health SLP,24 hour supervision/assistance Projected Equipment Needs: PT-To be determined, OT- To be determined, SLP-None recommended by SLP Equipment Details: PT-TBD based on patient's progress, patient has 2 RW and a SPC, OT-  Patient/family involved in discharge planning: PT- Patient,Family member/caregiver,  OT-Patient, SLP-Patient,Family member/caregiver  MD ELOS: 3-4 weeks Medical Rehab Prognosis:  Good Assessment: The patient has been admitted for CIR therapies with the diagnosis of debility related to seizures, GBM. The team will be addressing functional mobility, strength, stamina, balance, safety, adaptive techniques and equipment, self-care, bowel and bladder mgt, patient and caregiver education, NMR, cognition, communication, swallowing, pain mgt, establish overall goals of care/palliative care involvement. Goals have been set at min to mod assist.   Due to  the current state of emergency, patients may not be receiving their 3 hours per day of Medicare-mandated therapy.    Meredith Staggers, MD, FAAPMR      See Team Conference Notes for weekly updates to the plan of care

## 2020-05-16 NOTE — Progress Notes (Addendum)
[facility] PHYSICAL MEDICINE & REHABILITATION PROGRESS NOTE   Subjective/Complaints: Pt slept better last night but lethargic this morning. OT noticed more weakness an inattention to left. ?tremors. Had trouble with meds/po after I saw him this morning  ROS: Limited due to cognitive/behavioral    Objective:   No results found. Recent Labs    05/14/20 0559  WBC 3.7*  HGB 11.2*  HCT 33.7*  PLT 124*   Recent Labs    05/14/20 0559  NA 135  K 4.1  CL 96*  CO2 30  GLUCOSE 231*  BUN 11  CREATININE 0.56*  CALCIUM 8.5*    Intake/Output Summary (Last 24 hours) at 05/16/2020 6010 Last data filed at 05/16/2020 0749 Gross per 24 hour  Intake 120 ml  Output 884 ml  Net -764 ml     Pressure Injury 05/14/20 Sacrum Mid Unstageable - Full thickness tissue loss in which the base of the injury is covered by slough (yellow, tan, gray, green or brown) and/or eschar (tan, brown or black) in the wound bed. previously documented as stage 2,  (Active)  05/14/20 0132  Location: Sacrum  Location Orientation: Mid  Staging: Unstageable - Full thickness tissue loss in which the base of the injury is covered by slough (yellow, tan, gray, green or brown) and/or eschar (tan, brown or black) in the wound bed.  Wound Description (Comments): previously documented as stage 2, now unsteagable with with soft black tissue covering wound base  Present on Admission: Yes    Physical Exam: Vital Signs Blood pressure (!) 159/86, pulse 83, temperature 98.7 F (37.1 C), resp. rate 18, height 5\' 11"  (1.803 m), weight 96 kg, SpO2 100 %.  Constitutional: No distress . Vital signs reviewed. HEENT: EOMI, oral membranes moist Neck: supple Cardiovascular: RRR without murmur. No JVD    Respiratory/Chest: decreased inspiratory effort. No distress   GI/Abdomen: BS +, non-tender, non-distended Ext: no clubbing, cyanosis, or edema Psych: lethargic, confused. Skin: sacral stage II/unstageable, right heel redness,  boggy--chronic, mild bogginess left heel Neuro:   Cranial nerves 2-12 are intact. Sensory exam is intact to PP. Reflexes are 1+ in all 4's. Motor 3/5 RUE and 2+ to 3/5 LUE, BLE grossly 2/5 prox to 3+/5 distally-fair sitting balance, inconsistent participation in exam. Would follow simple commands Musculoskeletal: Full ROM, No pain with AROM or PROM in the neck, trunk, or extremities.       Assessment/Plan: 1. Functional deficits which require 3+ hours per day of interdisciplinary therapy in a comprehensive inpatient rehab setting.  Physiatrist is providing close team supervision and 24 hour management of active medical problems listed below.  Physiatrist and rehab team continue to assess barriers to discharge/monitor patient progress toward functional and medical goals  Care Tool:  Bathing    Body parts bathed by patient: Right arm,Left arm,Chest,Abdomen,Face   Body parts bathed by helper: Front perineal area,Buttocks,Right upper leg,Left upper leg,Right lower leg,Left lower leg     Bathing assist Assist Level: Maximal Assistance - Patient 24 - 49%     Upper Body Dressing/Undressing Upper body dressing   What is the patient wearing?: Pull over shirt    Upper body assist Assist Level: Maximal Assistance - Patient 25 - 49%    Lower Body Dressing/Undressing Lower body dressing      What is the patient wearing?: Incontinence brief     Lower body assist Assist for lower body dressing: Dependent - Patient 0%     Toileting Toileting Toileting Activity did not occur (  Clothing management and hygiene only): N/A (no void or bm)  Toileting assist Assist for toileting: 2 Helpers     Transfers Chair/bed transfer  Transfers assist  Chair/bed transfer activity did not occur: Safety/medical concerns (decreased strength/activity tolerance)  Chair/bed transfer assist level: 2 Helpers     Locomotion Ambulation   Ambulation assist   Ambulation activity did not occur:  Safety/medical concerns (decreased strength/activity tolerance)          Walk 10 feet activity   Assist  Walk 10 feet activity did not occur: Safety/medical concerns        Walk 50 feet activity   Assist Walk 50 feet with 2 turns activity did not occur: Safety/medical concerns         Walk 150 feet activity   Assist Walk 150 feet activity did not occur: Safety/medical concerns         Walk 10 feet on uneven surface  activity   Assist Walk 10 feet on uneven surfaces activity did not occur: Safety/medical concerns         Wheelchair     Assist     Wheelchair activity did not occur: Safety/medical concerns (decreased strength/activity tolerance to transfer to w/c on eval)         Wheelchair 50 feet with 2 turns activity    Assist    Wheelchair 50 feet with 2 turns activity did not occur: Safety/medical concerns       Wheelchair 150 feet activity     Assist  Wheelchair 150 feet activity did not occur: Safety/medical concerns       Blood pressure (!) 159/86, pulse 83, temperature 98.7 F (37.1 C), resp. rate 18, height 5\' 11"  (1.803 m), weight 96 kg, SpO2 100 %.  Medical Problem List and Plan: 1.Functional and mobility deficitssecondary to debility after recurrent seizures and associated respiratory failure. Resolving encephalopathy.  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 Consider 15/7 2. Antithrombotics: -DVT/anticoagulation:Pharmaceutical:Heparin -antiplatelet therapy: N/a 3. Pain Management:Tylenol prn 4. Mood:LCSW to follow for evaluation and support. -antipsychotic agents: will add low dose seroquel to help with sundowning 5. Neuropsych: This patientis not fullycapable of making decisions onhisown behalf.  1/27 ongoing/increased lethargy   -check cbc,  bmet, ua, ucx, head ct, and cxr given cough, intolerance of PO.   -on large amount of keppra, also is cancer is endstage   -reaching out to his wife today. She has already agreed to palliative care involvement 6. Skin/Wound Care:Routine pressure relief measures. -prevalon boots for bilateral heels -encourage adequate PO intake 7. Fluids/Electrolytes/Nutrition:encourage PO  -low protein state   -offering protein supps  8. HTN: Monitor BP tid--continue Norvasc and metoprolol  -fair control 9. Steroid induced hyperglycemia: Monitor BS ac/hs and use SSI for elevated BS.  -if plan is to continue decadron, he'll need to be on something scheduled to better control CBG's   -began amaryl 1mg  daily 1/25--with improvement 10. GBM/Seizures: Has been seizure free on Keppra 2000 mg bid.  -continue decadron 2mg  in am and 4mg  in pm  -followed by Dr. Rosanne Ashing wants to continue treatments after discharge 11. Bouts of agitation: Have resolved-->changed Pyridoxine to oral route. Continue to use ativan prn anxiety---limit benzos as possible 12. Hypoxia: Currently on 2 L per [place]  ---continue oxygen per [place]. -CXR with atelectasis and low lung volumes - encourage IS, deep breathing, OOB  -rechecking CXR as above  LOS: 3 days A FACE TO FACE EVALUATION WAS PERFORMED  Meredith Staggers 05/16/2020, 9:27 AM

## 2020-05-16 NOTE — Progress Notes (Signed)
Evaluated per nursing concerns. He was lethargic and mumbling --wet congested sounds noted with inability to elicit strong cough. Has course upper airway sound due to oral secretions. Saliva suctioned and noted to have minimal gag reflex. Will make patient NPO excepts ice chips till ST evaluation.Place saline lock and await work up results.

## 2020-05-16 NOTE — Progress Notes (Addendum)
Pt coughs with all thin liquids, will inform Pa. Discussed with Erin (SLP) will reassess.

## 2020-05-16 NOTE — Progress Notes (Signed)
Manufacturing engineer [facility])  Hospital Liaison: RN note         Notified by [facility] manager of patient/family request for [facility] Palliative services at home after discharge.         Writer spoke with patient to confirm interest and explain services.               [facility] team will follow up with patient after discharge.         Please call with any hospice or palliative related questions.         Thank you for this referral.         Farrel Gordon, RN, CCM  [facility] (listed on [place] under Hospice/[facility])    [phone]

## 2020-05-16 NOTE — Progress Notes (Signed)
Pt seems much more alert and is responding appropriately to questions but there is still some confusion at times. Wife at bedside

## 2020-05-16 NOTE — Progress Notes (Signed)
Patient ID: [name], male   DOB: 10-17-1945, 75 y.o.   MRN: [id number]  SW spoke with MaryAnne/[facility] [phone]) about outpatient palliative referral as an additional support for family once he discharges home. Will follow-up with patient wife. SW will update once there is a d/c date in place.    [name], MSW, [place] Office: [phone] Cell: [phone] Fax: [phone]

## 2020-05-16 NOTE — Progress Notes (Addendum)
Physical Therapy Session Note  Patient Details  Name: [name] MRN: [id number] Date of Birth: 1945/10/16  Today's Date: 05/16/2020 PT Individual Time: 1030-1055 PT Individual Time Calculation (min): 25 min   Short Term Goals: Week 1:  PT Short Term Goal 1 (Week 1): Patient will perform bed mobility with mod A consistently. PT Short Term Goal 2 (Week 1): Patient will perform basic transfers with max A +2 using LRAD. PT Short Term Goal 3 (Week 1): Patient will sit OOB >1 hour 2x per day 4/7 days this week.  Skilled Therapeutic Interventions/Progress Updates:     Session 1: Patient in bed asleep upon PT arrival. Patient difficult to arouse and lethargic this morning. Only kept eyes open 5-10 seconds at a time. Presented with significant L trunk and head positioning in the bed, patient unable to self-correct with cues. Provided manual facilitation for positioning trunk in midline and scooting up in the bed with total A +2. Patient unable to follow cues to bend his legs up to assist with scooting today. Patient's wife arrived, updated her on changes noted by staff in patient's function and cognition and informed her of medical work-up initiated. Patient able to bring his head to midline and gaze right with max cues from his wife, unable to turn his head to the R without max A from therapist and immediately returning to midline then L without facilitation. Placed pillow under L trunk and towell roll under the pillow to place patient in midline and prevent L lean while patient is semi-reclined in the bed. Discussed changes in patient's functional and cognitive presentation today and positioning with RN at end of session.  Patient in bed with his wife at bedside at end of session with breaks locked, bed alarm set, and all needs within reach.   Session 2 at 1410: Patient in bed asleep upon PT arrival. Patient lethargic and difficult to arouse, RN made aware and reports that he will be going for a  CT with transport soon. Patient missed 75 min of skilled PT due to fatigue/lethargy, RN made aware. Will attempt to make-up missed time as able.    Therapy Documentation Precautions:  Precautions Precautions: Fall Precaution Comments: Peripheral visual field cut L>R, seizures Restrictions Weight Bearing Restrictions: No Other Position/Activity Restrictions: on 2-4 L/min O2 General: PT Amount of Missed Time (min): 75 Minutes PT Missed Treatment Reason: Patient fatigue;CT/MRI   Therapy/Group: Individual Therapy  Shali Vesey L Ilianna Bown PT, DPT  05/16/2020, 2:22 PM

## 2020-05-16 NOTE — Progress Notes (Signed)
Occupational Therapy Session Note  Patient Details  Name: [name] MRN: [id number] Date of Birth: [birth year]  Today's Date: 05/16/2020 OT Individual Time: 4270-6237 OT Individual Time Calculation (min): 54 min    Short Term Goals: Week 1:  OT Short Term Goal 1 (Week 1): Pt will maintain EOB sitting balance with mod A OT Short Term Goal 2 (Week 1): Pt will set up grooming tasks with supervision to demo improved motor planning OT Short Term Goal 3 (Week 1): Pt will don shirt with mod A to demo improved sequencing OT Short Term Goal 4 (Week 1): Pt will require no more than min cueing to attend to dressing task  Skilled Therapeutic Interventions/Progress Updates:    1:1. Pt received in bed with NT present and RN coming in shortly after with morning medicaitons as well as pain medicaiton as pt reporting pain in BLE and "all over." pt much more agitated thanin previous sessions suspect due to pain. Pt occasionally cursing at OT, however pt continues to participate with calm stimulation. Pt requires total A today for all BADLs and demo increased fear of falling/pain impacting sitting balance/ADL performance. Of note, pt also with increased tremulous movements in BUE and increased L inattention this date. Pt sup>sitting ~30 min at EOB with varying assist (phases of S) up to total A. Pt requies 3 trials of WB into R elbow d/t pushing L this date. Total A to don shirt and thread BLE into pants. Despite MAX A of 2 pt with no initiation of STS instead just scooting forward to EOB. +2 required to pull chuck pad back as OT pushes knees towards bed to scoot pt away from the edge. Pt lies back onto bed with total A without initiation to bring BLE into bed. Exited session with pt seated in bed, exit alarm on and call light in reach   Therapy Documentation Precautions:  Precautions Precautions: Fall Precaution Comments: Peripheral visual field cut L>R, seizures Restrictions Weight Bearing  Restrictions: No Other Position/Activity Restrictions: on 2-4 L/min O2 General:   Vital Signs: Therapy Vitals Temp: 98.7 F (37.1 C) Pulse Rate: 83 Resp: 18 BP: (!) 159/86 Patient Position (if appropriate): Lying Oxygen Therapy SpO2: 100 % O2 Device: Room Air Pain:   ADL: ADL Eating: Minimal assistance Where Assessed-Eating: Bed level Grooming: Minimal assistance,Moderate cueing Where Assessed-Grooming: Edge of bed Upper Body Bathing: Minimal assistance,Moderate cueing Where Assessed-Upper Body Bathing: Edge of bed Lower Body Bathing: Dependent,Moderate cueing Where Assessed-Lower Body Bathing: Bed level Upper Body Dressing: Maximal assistance,Moderate cueing Where Assessed-Upper Body Dressing: Edge of bed Lower Body Dressing: Dependent Where Assessed-Lower Body Dressing: Bed level Toileting: Unable to assess Toilet Transfer Method: Unable to assess Vision   Perception    Praxis   Exercises:   Other Treatments:     Therapy/Group: Individual Therapy  Tonny Branch 05/16/2020, 6:59 AM

## 2020-05-16 NOTE — Progress Notes (Signed)
Urine sent, transport here to take pt to CT

## 2020-05-17 LAB — GLUCOSE, CAPILLARY
Glucose-Capillary: 135 mg/dL — ABNORMAL HIGH (ref 70–99)
Glucose-Capillary: 137 mg/dL — ABNORMAL HIGH (ref 70–99)
Glucose-Capillary: 145 mg/dL — ABNORMAL HIGH (ref 70–99)
Glucose-Capillary: 157 mg/dL — ABNORMAL HIGH (ref 70–99)
Glucose-Capillary: 111 mg/dL — ABNORMAL HIGH (ref 70–99)
Glucose-Capillary: 148 mg/dL — ABNORMAL HIGH (ref 70–99)

## 2020-05-17 MED ORDER — LIDOCAINE HCL URETHRAL/MUCOSAL 2 % EX PRSY
1.0000 "application " | PREFILLED_SYRINGE | Freq: Once | CUTANEOUS | Status: DC
  Filled 2020-05-17: qty 1

## 2020-05-17 MED ORDER — DEXAMETHASONE 4 MG PO TABS
4.0000 mg | ORAL_TABLET | Freq: Two times a day (BID) | ORAL | Status: DC
  Administered 2020-05-17 – 2020-06-11 (×49): 4 mg via ORAL
  Filled 2020-05-17 (×50): qty 1

## 2020-05-17 MED ORDER — SODIUM CHLORIDE 0.9 % IV SOLN
1.0000 g | INTRAVENOUS | Status: DC
  Administered 2020-05-17 – 2020-05-18 (×2): 1 g via INTRAVENOUS
  Filled 2020-05-17 (×2): qty 1

## 2020-05-17 NOTE — Progress Notes (Signed)
Physical Therapy Session Note  Patient Details  Name: [name] MRN: [id number] Date of Birth: 06/06/1945  Today's Date: 05/17/2020 PT Individual Time: 1410-1515 PT Individual Time Calculation (min): 65 min   Short Term Goals: Week 1:  PT Short Term Goal 1 (Week 1): Patient will perform bed mobility with mod A consistently. PT Short Term Goal 2 (Week 1): Patient will perform basic transfers with max A +2 using LRAD. PT Short Term Goal 3 (Week 1): Patient will sit OOB >1 hour 2x per day 4/7 days this week. Week 2:    Week 3:     Skilled Therapeutic Interventions/Progress Updates:    PAIN Pt initially supine, agreeable to session.  Pt on 2L 02 via [place], transferred to portable tank. Nursing in/stating she had to work on his IV before pt gets oob, 10 missed time due to this.  Therapist provided distraction as pt became agitated w/nursing during this activity.  Easily distracted and redirected.   Pt supine to sit w/mod assist and cues.  Pt easily frustrated w/activities and w/therapist direction, again easily redirected and calmed.   Pt Sit to stand in Steady w/mult attempts and max assist due to poor ability to motor plan/initiate/wt shift anteriorly for transition.  Bed to wc Steady transfer +2.  Pt fearful of falling off of Steady depsite reassurance.  Pt performed wc propulsion x 68ft using bilat LEs, max cues for use of LLE, mod assist for forward progression. Pt transported to gym  In sitting w/large blue Swiss Ball, pt worked on progressive ant wt shift.  Would not roll ball forward/back w/hands due to sense of falling, but performed seated "push ups" w/gradual advancement of hands forward on stablized ball.  Sit to stand using outside of parallel bar to facilitate ant wt shift/reaching forward performed w/3 successful attempts, 4 failed attempts as pt has much difficulty w/wt shifting, motor planning, and fear of falling.  Pt frequently argues w/suggestions for set up/hand and feet  positioning (attempts to put one foot up onto parallel bar platform vs on floor) but easily redirected/calmed.     From seated wc - attempted having pt replicate simple pattern of 4 colored pegs in horizontal line on peg board.  Pt unable to follow simple directions for task.  When therapist begins task for pt, continues to be unable to follow.  When given single verbal and visual cue for peg placement pt able to place 2/4 pegs correctly.    Pt transported back to room.  Requested to remain oob in wc, declines assist back to bed.  Chair alarm belt fastened/turned on and pt placed in tilt position for safety.  Call bell in lap and reviewed process/need to call for assist when needed. Therapy Documentation Precautions:  Precautions Precautions: Fall Precaution Comments: Peripheral visual field cut L>R, seizures Restrictions Weight Bearing Restrictions: No Other Position/Activity Restrictions: on 2-4 L/min O2  Therapy/Group: Individual Therapy  Callie Fielding, [place] 05/17/2020, 3:32 PM

## 2020-05-17 NOTE — Progress Notes (Signed)
Occupational Therapy Session Note  Patient Details  Name: [name] MRN: [id number] Date of Birth: 01/12/1946  Today's Date: 05/17/2020 OT Individual Time: 0930-1030 OT Individual Time Calculation (min): 60 min    Short Term Goals: Week 1:  OT Short Term Goal 1 (Week 1): Pt will maintain EOB sitting balance with mod A OT Short Term Goal 2 (Week 1): Pt will set up grooming tasks with supervision to demo improved motor planning OT Short Term Goal 3 (Week 1): Pt will don shirt with mod A to demo improved sequencing OT Short Term Goal 4 (Week 1): Pt will require no more than min cueing to attend to dressing task  Skilled Therapeutic Interventions/Progress Updates:    1:1. Pt much brighter and less pain this AM but reporting pain in buttocks at end of session with relief tilting back TIS. Pt completes LE lifting to A with LB dressing total A rolling with MAX A and improved command following. Pt sup>sitting with MAX A of 1 and sits with guarding a by +2 while OT facilitates grooming at EOB. Items on L for L attention and requires max A for sequencing. Pt sit to stand at EOB in stedy wit MAX A of 2 to transfer into TIS. Attempted simple tabletop construction task for L attention and visual spatial awareness, however pt uanble to complete. Pt unable ot identify how many blocks were pictured in photo without total cuing. Exited session with pt seated in TIS, exit alarm on, wife in room, NT aware ofpositioning and call light in reach   Therapy Documentation Precautions:  Precautions Precautions: Fall Precaution Comments: Peripheral visual field cut L>R, seizures Restrictions Weight Bearing Restrictions: No Other Position/Activity Restrictions: on 2-4 L/min O2 General:   Vital Signs:  Pain:   ADL: ADL Eating: Minimal assistance Where Assessed-Eating: Bed level Grooming: Minimal assistance,Moderate cueing Where Assessed-Grooming: Edge of bed Upper Body Bathing: Minimal  assistance,Moderate cueing Where Assessed-Upper Body Bathing: Edge of bed Lower Body Bathing: Dependent,Moderate cueing Where Assessed-Lower Body Bathing: Bed level Upper Body Dressing: Maximal assistance,Moderate cueing Where Assessed-Upper Body Dressing: Edge of bed Lower Body Dressing: Dependent Where Assessed-Lower Body Dressing: Bed level Toileting: Unable to assess Toilet Transfer Method: Unable to assess Vision   Perception    Praxis   Exercises:   Other Treatments:     Therapy/Group: Individual Therapy  Tonny Branch 05/17/2020, 11:34 AM

## 2020-05-17 NOTE — Progress Notes (Addendum)
Speech Language Pathology Daily Session Note  Patient Details  Name: [name] MRN: [id number] Date of Birth: December 25, 1945  Today's Date: 05/17/2020 SLP Individual Time: 5732-2025 SLP Individual Time Calculation (min): 40 min  Short Term Goals: Week 1: SLP Short Term Goal 1 (Week 1): Pt will sustain attention to functional tasks for 3-5 minute intervals with Max A cues for redirection. SLP Short Term Goal 2 (Week 1): Pt will demonstrate recall of 1 safety precaution with Max A cues for use of strategies or aids. SLP Short Term Goal 3 (Week 1): Pt will orient to date, place, and situation with Max A cues for use of external aids or other strategies. SLP Short Term Goal 4 (Week 1): Pt will identify 1 cognitive and 1 physical impairment in setting of acute hospitalization with Max A multimodal cues. SLP Short Term Goal 5 (Week 1): Pt will use strategies for speech fluency/reducing verbal repetitions with Max A multimodal cues. SLP Short Term Goal 6 (Week 1): Pt will consume dys 3 textures and thin liquids with min cues for use of swallowing precautions and minimal overt s/s of aspiration.  Skilled Therapeutic Interventions:  Pt was seen for skilled ST targeting ongoing diagnostic treatment of swallowing in light of acute change in status yesterday.  Today, pt was sleeping lightly upon therapist's arrival but awakened easily to voice and light touch and stated that he would like something to eat and drink.  Verified with nursing that it was okay to give pt POs to assess readiness to resume diet.  Pt's voice at baseline was intermittently, mildly wet.  This did not change in frequency or intensity with POs.  Pt had x2 mild coughing episodes that were difficult to directly correlate with PO intake due to the delay between cough and swallow.  His oral phase was efficient with liquids, solids, and purees but pt overall appears very debilitated.  As a result, when resuming a diet I suggest a dys 3 diet  for energy conservation.  Pt may resume thin liquids but I would prefer that pt have full supervision during meals in light of his cognitive deficits, debility, and medical fragility.  Discussed with nursing.  Dysphagia goals initiated in light of today's findings.  Pt was left in bed with bed alarm set and call bell within reach.  Continue per current plan of care.    Pain Pain Assessment Pain Scale: 0-10 Pain Score: 0-No pain  Therapy/Group: Individual Therapy  Cormac Wint, Selinda Or[place] 05/17/2020, 12:52 PM

## 2020-05-17 NOTE — Progress Notes (Addendum)
[place] PHYSICAL MEDICINE & REHABILITATION PROGRESS NOTE   Subjective/Complaints: Pt tells me he slept last night. Seems brighter this morning. "i'm hungry." tells me he's up for working with therapy  ROS: Limited due to cognitive/behavioral   Objective:   DG Chest 2 View  Result Date: 05/16/2020 CLINICAL DATA:  Shortness of breath EXAM: CHEST - 2 VIEW COMPARISON:  May 13, 2020 FINDINGS: Evaluation is limited secondary to inability to optimally position patient. The cardiomediastinal silhouette is unchanged and enlarged in contour.Status post median sternotomy and CABG. Low lung volumes. No pleural effusion. No pneumothorax. Bibasilar hazy opacities, similar in comparison to prior. Visualized abdomen is unremarkable. Multilevel degenerative changes of the thoracic spine. IMPRESSION: Low lung volumes and bibasilar hazy opacities, similar in comparison to prior. Electronically Signed   By: Valentino Saxon MD   On: 05/16/2020 15:35   CT HEAD WO CONTRAST  Result Date: 05/16/2020 CLINICAL DATA:  Mental status change, lethargic, history of glioblastoma EXAM: CT HEAD WITHOUT CONTRAST TECHNIQUE: Contiguous axial images were obtained from the base of the skull through the vertex without intravenous contrast. COMPARISON:  05/06/2020 FINDINGS: Brain: Right occipital mass seen previously is again identified, with evidence of extension to the splenium of the corpus callosum as seen on previous MRI. There is surrounding vasogenic edema, with serpiginous areas of calcification within the right occipital lobe unchanged. Persistent leftward midline shift measuring approximately 8 mm at the level of the septum pellucidum, slightly more pronounced than prior study where it measured 6 mm. There is effacement of the right lateral ventricle. No acute extra-axial fluid collections. No evidence of acute infarct or hemorrhage. Vascular: No hyperdense vessel or unexpected calcification. Skull: Normal. Negative for  fracture or focal lesion. Postsurgical changes from previous right occipital craniotomy. Sinuses/Orbits: Gas fluid levels within the sphenoid sinuses are new since prior study. Remaining sinuses are clear. Other: None. IMPRESSION: 1. 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 Slight increase in leftward midline shift, now measuring 8 mm. 2. No acute infarct or hemorrhage. Electronically Signed   By: Randa Ngo M.D.   On: 05/16/2020 18:55   Recent Labs    05/16/20 0953  WBC 6.9  HGB 11.8*  HCT 35.7*  PLT 120*   Recent Labs    05/16/20 0953  NA 139  K 3.6  CL 102  CO2 27  GLUCOSE 122*  BUN 16  CREATININE 0.56*  CALCIUM 8.5*    Intake/Output Summary (Last 24 hours) at 05/17/2020 1009 Last data filed at 05/17/2020 0510 Gross per 24 hour  Intake 690.39 ml  Output 600 ml  Net 90.39 ml     Pressure Injury 05/14/20 Sacrum Mid Unstageable - Full thickness tissue loss in which the base of the injury is covered by slough (yellow, tan, gray, green or brown) and/or eschar (tan, brown or black) in the wound bed. previously documented as stage 2,  (Active)  05/14/20 0132  Location: Sacrum  Location Orientation: Mid  Staging: Unstageable - Full thickness tissue loss in which the base of the injury is covered by slough (yellow, tan, gray, green or brown) and/or eschar (tan, brown or black) in the wound bed.  Wound Description (Comments): previously documented as stage 2, now unsteagable with with soft black tissue covering wound base  Present on Admission: Yes    Physical Exam: Vital Signs Blood pressure (!) 108/46, pulse 65, temperature 98.5 F (36.9 C), resp.  rate 18, height 5\' 11"  (1.803 m), weight 96 kg, SpO2 95 %.  Constitutional: No distress . Vital signs reviewed. HEENT: EOMI, oral membranes moist Neck: supple Cardiovascular: RRR without murmur. No JVD    Respiratory/Chest:  CTA Bilaterally without wheezes or rales. Normal effort. O2 [place] GI/Abdomen: BS +, non-tender, non-distended Ext: no clubbing, cyanosis, or edema Psych: pleasant and cooperative, confused Skin: sacral stage II/unstageable, right heel redness, boggy--chronic, mild bogginess left heel- without change Neuro:  Pt is alert. Oriented to person, hospital. Follows basic commands.  Cranial nerves 2-12 are intact. Sensory exam is intact to PP. Reflexes are 1+ in all 4's. Mild left inattention. Motor 3/5 RUE and 2+ to 3/5 LUE, BLE grossly 2/5 prox to 3+/5  Musculoskeletal: Full ROM, No pain with AROM or PROM in the neck, trunk, or extremities.       Assessment/Plan: 1. Functional deficits which require 3+ hours per day of interdisciplinary therapy in a comprehensive inpatient rehab setting.  Physiatrist is providing close team supervision and 24 hour management of active medical problems listed below.  Physiatrist and rehab team continue to assess barriers to discharge/monitor patient progress toward functional and medical goals  Care Tool:  Bathing    Body parts bathed by patient: Right arm,Left arm,Chest,Abdomen,Face   Body parts bathed by helper: Front perineal area,Buttocks,Right upper leg,Left upper leg,Right lower leg,Left lower leg     Bathing assist Assist Level: Maximal Assistance - Patient 24 - 49%     Upper Body Dressing/Undressing Upper body dressing   What is the patient wearing?: Pull over shirt    Upper body assist Assist Level: Maximal Assistance - Patient 25 - 49%    Lower Body Dressing/Undressing Lower body dressing      What is the patient wearing?: Incontinence brief     Lower body assist Assist for lower body dressing: Dependent - Patient 0%     Toileting Toileting Toileting Activity did not occur (Clothing management and hygiene only): N/A (no void or bm)  Toileting assist Assist for toileting: 2 Helpers     Transfers Chair/bed transfer  Transfers  assist  Chair/bed transfer activity did not occur: Safety/medical concerns (decreased strength/activity tolerance)  Chair/bed transfer assist level: 2 Helpers     Locomotion Ambulation   Ambulation assist   Ambulation activity did not occur: Safety/medical concerns (decreased strength/activity tolerance)          Walk 10 feet activity   Assist  Walk 10 feet activity did not occur: Safety/medical concerns        Walk 50 feet activity   Assist Walk 50 feet with 2 turns activity did not occur: Safety/medical concerns         Walk 150 feet activity   Assist Walk 150 feet activity did not occur: Safety/medical concerns         Walk 10 feet on uneven surface  activity   Assist Walk 10 feet on uneven surfaces activity did not occur: Safety/medical concerns         Wheelchair     Assist Will patient use wheelchair at discharge?: Yes (Per PT long term goals)   Wheelchair activity did not occur: Safety/medical concerns (decreased strength/activity tolerance to transfer to w/c on eval)         Wheelchair 50 feet with 2 turns activity    Assist    Wheelchair 50 feet with 2 turns activity did not occur: Safety/medical concerns       Wheelchair 150 feet activity  Assist  Wheelchair 150 feet activity did not occur: Safety/medical concerns       Blood pressure (!) 108/46, pulse 65, temperature 98.5 F (36.9 C), resp. rate 18, height 5\' 11"  (1.803 m), weight 96 kg, SpO2 95 %.  Medical Problem List and Plan: 1.Functional and mobility deficitssecondary to debility after recurrent seizures and associated respiratory failure. Resolving encephalopathy.  Pt with history of GBM/resection 06/2019 -patient may shower -ELOS/Goals: 14-18 days, min to mod assist with PT, OT, SLP  -therapies as tolerated 2. Antithrombotics: -DVT/anticoagulation:Pharmaceutical:Heparin -antiplatelet therapy: N/a 3. Pain  Management:Tylenol prn 4. Mood/agitation: -antipsychotic agents: will add low dose seroquel to help with sundowning  -limit benzos  -continue pyridoxine 5. Neuropsych/AMS: This patientis not fullycapable of making decisions onhisown behalf.  1/27-28  increased lethargy and left sided weakness   -UA+, UCX pending---empiric rocephin initiated   -Head CT demonstrates gradual increase in edema and leftward shift. Have reached out to [name] regarding any recommendations at this point 6. Skin/Wound Care:Routine pressure relief measures. -prevalon boots for bilateral heels -encourage adequate PO intake 7. Fluids/Electrolytes/Nutrition:encourage PO  -low protein state   -offering protein supps  8. HTN: Monitor BP tid--continue Norvasc and metoprolol  -fair control 9. Steroid induced hyperglycemia: Monitor BS ac/hs and use SSI for elevated BS.  -if plan is to continue decadron, he'll need to be on something scheduled to better control CBG's   -began amaryl 1mg  daily 1/25--with improvement 10. GBM/Seizures: Has been seizure free on Keppra 2000 mg bid.  -continue decadron 2mg  in am and 4mg  in pm  -followed by Dr. Rosanne Ashing had planned to continue treatments after discharge 12. Hypoxia: Currently on 2 L per [place]  ---continue oxygen per [place]. -CXR with atelectasis and low lung volumes - encourage IS, deep breathing, OOB  -f/u cxr stable    Greater than 35 total minutes was spent in examination of patient, assessment of pertinent data,  formulation of a treatment plan, and in discussion with patient, treatment team, and/or family.     LOS: 4 days A FACE TO FACE EVALUATION WAS PERFORMED  Meredith Staggers 05/17/2020, 10:09 AM

## 2020-05-18 LAB — URINE CULTURE: Culture: >=100000 — AB

## 2020-05-18 LAB — GLUCOSE, CAPILLARY
Glucose-Capillary: 172 mg/dL — ABNORMAL HIGH (ref 70–99)
Glucose-Capillary: 162 mg/dL — ABNORMAL HIGH (ref 70–99)
Glucose-Capillary: 125 mg/dL — ABNORMAL HIGH (ref 70–99)
Glucose-Capillary: 114 mg/dL — ABNORMAL HIGH (ref 70–99)

## 2020-05-18 MED ORDER — AMOXICILLIN 250 MG PO CAPS
250.0000 mg | ORAL_CAPSULE | Freq: Three times a day (TID) | ORAL | Status: AC
  Administered 2020-05-19 – 2020-05-26 (×24): 250 mg via ORAL
  Filled 2020-05-18 (×25): qty 1

## 2020-05-18 NOTE — Progress Notes (Signed)
Occupational Therapy Session Note  Patient Details  Name: [name] MRN: [id number] Date of Birth: Aug 29, 1945  Today's Date: 05/18/2020 OT Individual Time: 1300-1310 OT Individual Time Calculation (min): 10 min  and Today's Date: 05/18/2020 OT Missed Time: 50 Minutes Missed Time Reason: Patient unwilling/refused to participate without medical reason   Short Term Goals: Week 1:  OT Short Term Goal 1 (Week 1): Pt will maintain EOB sitting balance with mod A OT Short Term Goal 2 (Week 1): Pt will set up grooming tasks with supervision to demo improved motor planning OT Short Term Goal 3 (Week 1): Pt will don shirt with mod A to demo improved sequencing OT Short Term Goal 4 (Week 1): Pt will require no more than min cueing to attend to dressing task  Skilled Therapeutic Interventions/Progress Updates:    1:1. Pt received in TIS with lunch in front of pt [place] with 2L O2 flowing to forehead. Pt pleasant initially, but declines all options of tx, bathing/dressing, going to gym,rolling in hallway, and even getting pt back to bed. Pt states, "you need to understand, when I dont feel like moving, Im not going to." educated pt that pt needs to participate in tx to improve functional independence and decrease BOC on wife. Pt  Acknowledged this but continues to decline. Ot placed [place] back on nose after checking vitals O2 at 96% at rest seated. Titrated to 1.5 L as pt sometimes dips at rest. Exited session with pt seated in TIS, exit alarm on and call light in reach   Therapy Documentation Precautions:  Precautions Precautions: Fall Precaution Comments: Peripheral visual field cut L>R, seizures Restrictions Weight Bearing Restrictions: No Other Position/Activity Restrictions: on 2-4 L/min O2 General:   Vital Signs: Therapy Vitals Temp: 98.1 F (36.7 C) Temp Source: Oral Pulse Rate: 83 Resp: 16 BP: 137/87 Patient Position (if appropriate): Lying Oxygen Therapy SpO2: 95 % O2 Device:  Nasal Cannula Pain:   ADL: ADL Eating: Minimal assistance Where Assessed-Eating: Bed level Grooming: Minimal assistance,Moderate cueing Where Assessed-Grooming: Edge of bed Upper Body Bathing: Minimal assistance,Moderate cueing Where Assessed-Upper Body Bathing: Edge of bed Lower Body Bathing: Dependent,Moderate cueing Where Assessed-Lower Body Bathing: Bed level Upper Body Dressing: Maximal assistance,Moderate cueing Where Assessed-Upper Body Dressing: Edge of bed Lower Body Dressing: Dependent Where Assessed-Lower Body Dressing: Bed level Toileting: Unable to assess Toilet Transfer Method: Unable to assess Vision   Perception    Praxis   Exercises:   Other Treatments:     Therapy/Group: Individual Therapy  Tonny Branch 05/18/2020, 6:59 AM

## 2020-05-18 NOTE — Progress Notes (Signed)
Physical Therapy Session Note  Patient Details  Name: [name] MRN: [id number] Date of Birth: July 06, 1945  Today's Date: 05/18/2020 PT Individual Time: 4627-0350 PT Individual Time Calculation (min): 71 min   Short Term Goals: Week 1:  PT Short Term Goal 1 (Week 1): Patient will perform bed mobility with mod A consistently. PT Short Term Goal 2 (Week 1): Patient will perform basic transfers with max A +2 using LRAD. PT Short Term Goal 3 (Week 1): Patient will sit OOB >1 hour 2x per day 4/7 days this week.  Skilled Therapeutic Interventions/Progress Updates:    Pt received asleep, supine in bed and upon awakening agreeable to therapy session with min encouragement and redirection. Received and maintained on 2L of O2 via nasal cannula. RN present to disconnect IV. Throughout session when cuing pt to complete motor task he demos poor awareness and impaired motor planning often lacking initiation of the task and then stating he is unable to perform it; however, demos improvement when providing sequence cuing to improve motor planning. In supine donned pants with total assist - cuing to lift LEs to assist with task and pt stating he can't but with encouragement and cuing for increased awareness pt able to increase participation. Rolling L and R mod assist to pull pants over hips total assist. Supine>sitting L EOB, HOB flat but using bedrail, via logroll technique with max assist of 1 and max cuing for sequencing with continued poor motor planning and awareness. Sitting EOB able to maintain static trunk control with supervision for safety. NT present to assist with mobility. Sit>stand elevated EOB>stedy with +2 mod assist for lifting to stand - pt fearful of falling once standing, stating he feels that he is going backwards and has intermittent knee buckle that pt is able to correct using stedy knee block and UE support - provided visual feedback of having pt stand in line with NT to improve upright  posture. Sitting on stedy seat pt states he feels that he is going to fall backwards the whole time requiring emotional support, redirection, and therapist again using visual feedback for him to sit in line with therapist to improve awareness of midline. Sit<>stand to/from stedy seat 2x with +2 mod assist for lifting and tolerated static standing in stedy with mod assist of 1 working on upright tolerance and midline orientation. Stedy transfer to w/c.  Transported to/from gym in w/c for time management and energy conservation. Performed remainder of session in TIS w/c (no +2 available and pt unable to come to standing from low w/c seat height with only +1 assist). Completed L attention and forward trunk lean/weightshifting tasks (as he lacks this movement upon attempts at coming to stand) via placing large wooden PEGs in board and tossing horseshoes - pt has difficulty understanding 1-step directions and sustaining attention on the task when completing PEG board requiring repeated cuing/redirection - demos improved L attention and anterior weight shift during horseshoes. Transported back to room and pt left sitting in TIS w/c for meal with needs in reach, seat belt alarm on, and NT aware of pt's position.  Therapy Documentation Precautions:  Precautions Precautions: Fall Precaution Comments: Peripheral visual field cut L>R, seizures Restrictions Weight Bearing Restrictions: No Other Position/Activity Restrictions: on 2-4 L/min O2  Pain: Pt frequently stating "owe" upon tactile cuing to LEs with pt appearing to have increased sensitivity to touch.  Therapy/Group: Individual Therapy  Tawana Scale , PT, DPT, CSRS  05/18/2020, 8:09 AM

## 2020-05-18 NOTE — Progress Notes (Signed)
Physical Therapy Session Note  Patient Details  Name: [name] MRN: [id number] Date of Birth: 07-Feb-1946  Today's Date: 05/18/2020 PT Individual Time: 4627-0350 PT Individual Time Calculation (min): 55 min   Short Term Goals: Week 1:  PT Short Term Goal 1 (Week 1): Patient will perform bed mobility with mod A consistently. PT Short Term Goal 2 (Week 1): Patient will perform basic transfers with max A +2 using LRAD. PT Short Term Goal 3 (Week 1): Patient will sit OOB >1 hour 2x per day 4/7 days this week.  Skilled Therapeutic Interventions/Progress Updates:   Pt received supine in bed and agreeable to PT. Supine>sit transfer with max assist at trunk and LE to initaite movement and prevent posterior bias. Once EOB pt able to sit with supervision assist x 5 minutes with cues for proper UE placement to improve stability. sdtedy transfer to [facility] with max assist on 2 attempts, as pt unable to initiated movement on first attempt. Pt performed reciprocal movement.endurance training with floor cycle 4 x 1 min with min assist to continue reciprocal movement pattern and prevent posterior LOB when pushing through BLE. Pt then performed forward reach/push to roll therapy ball forward 3 x 10 with mod assist from Pt to initiate movement at trunk. Pt initially resistant to anterior weight shift, but increased with repetitions. Stedy transfer back to bed with total A+2 to initiated stand from [facility]. Sit>supine with +2 assist as pt unable to initiate movement. Pt  left supine in bed with call bell in reach and all needs met.        Therapy Documentation Precautions:  Precautions Precautions: Fall Precaution Comments: Peripheral visual field cut L>R, seizures Restrictions Weight Bearing Restrictions: No Other Position/Activity Restrictions: on 2-4 L/min O2 Vital Signs: Therapy Vitals Temp: 97.7 F (36.5 C) Pulse Rate: 96 Resp: 17 BP: 123/86 Patient Position (if appropriate): Lying Oxygen  Therapy SpO2: 99 % O2 Device: Nasal Cannula Pain:   denies but reports discomfort when BLE touched.   Therapy/Group: Individual Therapy  Lorie Phenix 05/18/2020, 5:47 PM

## 2020-05-18 NOTE — Progress Notes (Signed)
[place] PHYSICAL MEDICINE & REHABILITATION PROGRESS NOTE   Subjective/Complaints: Patient sitting with oxygen dangling from his ear.  Denies shortness of breath.  Patient appears comfortable no evidence of respiratory distress.  ROS: Limited due to cognitive/behavioral   Objective:   DG Chest 2 View  Result Date: 05/16/2020 CLINICAL DATA:  Shortness of breath EXAM: CHEST - 2 VIEW COMPARISON:  May 13, 2020 FINDINGS: Evaluation is limited secondary to inability to optimally position patient. The cardiomediastinal silhouette is unchanged and enlarged in contour.Status post median sternotomy and CABG. Low lung volumes. No pleural effusion. No pneumothorax. Bibasilar hazy opacities, similar in comparison to prior. Visualized abdomen is unremarkable. Multilevel degenerative changes of the thoracic spine. IMPRESSION: Low lung volumes and bibasilar hazy opacities, similar in comparison to prior. Electronically Signed   By: Valentino Saxon MD   On: 05/16/2020 15:35   CT HEAD WO CONTRAST  Result Date: 05/16/2020 CLINICAL DATA:  Mental status change, lethargic, history of glioblastoma EXAM: CT HEAD WITHOUT CONTRAST TECHNIQUE: Contiguous axial images were obtained from the base of the skull through the vertex without intravenous contrast. COMPARISON:  05/06/2020 FINDINGS: Brain: Right occipital mass seen previously is again identified, with evidence of extension to the splenium of the corpus callosum as seen on previous MRI. There is surrounding vasogenic edema, with serpiginous areas of calcification within the right occipital lobe unchanged. Persistent leftward midline shift measuring approximately 8 mm at the level of the septum pellucidum, slightly more pronounced than prior study where it measured 6 mm. There is effacement of the right lateral ventricle. No acute extra-axial fluid collections. No evidence of acute infarct or hemorrhage. Vascular: No hyperdense vessel or unexpected calcification.  Skull: Normal. Negative for fracture or focal lesion. Postsurgical changes from previous right occipital craniotomy. Sinuses/Orbits: Gas fluid levels within the sphenoid sinuses are new since prior study. Remaining sinuses are clear. Other: None. IMPRESSION: 1. 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 Slight increase in leftward midline shift, now measuring 8 mm. 2. No acute infarct or hemorrhage. Electronically Signed   By: Randa Ngo M.D.   On: 05/16/2020 18:55   Recent Labs    05/16/20 0953  WBC 6.9  HGB 11.8*  HCT 35.7*  PLT 120*   Recent Labs    05/16/20 0953  NA 139  K 3.6  CL 102  CO2 27  GLUCOSE 122*  BUN 16  CREATININE 0.56*  CALCIUM 8.5*    Intake/Output Summary (Last 24 hours) at 05/18/2020 1228 Last data filed at 05/18/2020 1108 Gross per 24 hour  Intake 570.67 ml  Output 250 ml  Net 320.67 ml     Pressure Injury 05/14/20 Sacrum Mid Unstageable - Full thickness tissue loss in which the base of the injury is covered by slough (yellow, tan, gray, green or brown) and/or eschar (tan, brown or black) in the wound bed. previously documented as stage 2,  (Active)  05/14/20 0132  Location: Sacrum  Location Orientation: Mid  Staging: Unstageable - Full thickness tissue loss in which the base of the injury is covered by slough (yellow, tan, gray, green or brown) and/or eschar (tan, brown or black) in the wound bed.  Wound Description (Comments): previously documented as stage 2, now unsteagable with with soft black tissue covering wound base  Present on Admission: Yes    Physical Exam: Vital Signs Blood pressure 137/87, pulse 83, temperature 98.1 F (36.7 C), temperature  source Oral, resp. rate 16, height 5\' 11"  (1.803 m), weight 96 kg, SpO2 95 %.   General: No acute distress Mood and affect are appropriate Heart: Regular rate and rhythm no rubs murmurs or extra  sounds Lungs: Clear to auscultation, breathing unlabored, no rales or wheezes Abdomen: Positive bowel sounds, soft nontender to palpation, nondistended Extremities: No clubbing, cyanosis, or edema Skin: No evidence of breakdown, no evidence of rash   Neuro:  Pt is alert. Oriented to person, hospital. Follows basic commands.  Cranial nerves 2-12 are intact. Sensory exam is intact to PP. Reflexes are 1+ in all 4's. Mild left inattention. Motor 4/5 RUE and  4/5 LUE, BLE grossly 2/5 prox to 3+/5  Musculoskeletal: Full ROM, No pain with AROM or PROM in the neck, trunk, or extremities.       Assessment/Plan: 1. Functional deficits which require 3+ hours per day of interdisciplinary therapy in a comprehensive inpatient rehab setting.  Physiatrist is providing close team supervision and 24 hour management of active medical problems listed below.  Physiatrist and rehab team continue to assess barriers to discharge/monitor patient progress toward functional and medical goals  Care Tool:  Bathing    Body parts bathed by patient: Right arm,Left arm,Chest,Abdomen,Face   Body parts bathed by helper: Front perineal area,Buttocks,Right upper leg,Left upper leg,Right lower leg,Left lower leg     Bathing assist Assist Level: Maximal Assistance - Patient 24 - 49%     Upper Body Dressing/Undressing Upper body dressing   What is the patient wearing?: Pull over shirt    Upper body assist Assist Level: Maximal Assistance - Patient 25 - 49%    Lower Body Dressing/Undressing Lower body dressing      What is the patient wearing?: Incontinence brief     Lower body assist Assist for lower body dressing: Dependent - Patient 0%     Toileting Toileting Toileting Activity did not occur (Clothing management and hygiene only): N/A (no void or bm)  Toileting assist Assist for toileting: 2 Helpers     Transfers Chair/bed transfer  Transfers assist  Chair/bed transfer activity did not occur:  Safety/medical concerns (decreased strength/activity tolerance)  Chair/bed transfer assist level: Dependent - Patient 0% (Steady +2)     Locomotion Ambulation   Ambulation assist   Ambulation activity did not occur: Safety/medical concerns (decreased strength/activity tolerance)          Walk 10 feet activity   Assist  Walk 10 feet activity did not occur: Safety/medical concerns        Walk 50 feet activity   Assist Walk 50 feet with 2 turns activity did not occur: Safety/medical concerns         Walk 150 feet activity   Assist Walk 150 feet activity did not occur: Safety/medical concerns         Walk 10 feet on uneven surface  activity   Assist Walk 10 feet on uneven surfaces activity did not occur: Safety/medical concerns         Wheelchair     Assist Will patient use wheelchair at discharge?: Yes (Per PT long term goals)   Wheelchair activity did not occur: Safety/medical concerns (decreased strength/activity tolerance to transfer to w/c on eval)         Wheelchair 50 feet with 2 turns activity    Assist    Wheelchair 50 feet with 2 turns activity did not occur: Safety/medical concerns       Wheelchair 150 feet activity  Assist  Wheelchair 150 feet activity did not occur: Safety/medical concerns       Blood pressure 137/87, pulse 83, temperature 98.1 F (36.7 C), temperature source Oral, resp. rate 16, height 5\' 11"  (1.803 m), weight 96 kg, SpO2 95 %.  Medical Problem List and Plan: 1.Functional and mobility deficitssecondary to debility after recurrent seizures and associated respiratory failure. Resolving encephalopathy.  Pt with history of GBM/resection 06/2019 -patient may shower -ELOS/Goals: 14-18 days, min to mod assist with PT, OT, SLP  -therapies as tolerated 2. Antithrombotics: -DVT/anticoagulation:Pharmaceutical:Heparin -antiplatelet therapy: N/a 3. Pain  Management:Tylenol prn 4. Mood/agitation: -antipsychotic agents: will add low dose seroquel to help with sundowning  -limit benzos  -continue pyridoxine 5. Neuropsych/AMS: This patientis not fullycapable of making decisions onhisown behalf.  1/27-28  increased lethargy and left sided weakness   -UA+, UCX pending---empiric rocephin initiated   -Head CT demonstrates gradual increase in edema and leftward shift. Have reached out to [name] regarding any recommendations at this point 6. Skin/Wound Care:Routine pressure relief measures. -prevalon boots for bilateral heels -encourage adequate PO intake 7. Fluids/Electrolytes/Nutrition:encourage PO  -low protein state   -offering protein supps  8. HTN: Monitor BP tid--continue Norvasc and metoprolol  -fair control 9. Steroid induced hyperglycemia: Monitor BS ac/hs and use SSI for elevated BS.  -if plan is to continue decadron, he'll need to be on something scheduled to better control CBG's   -began amaryl 1mg  daily 1/25--with improvement 10. GBM/Seizures: Has been seizure free on Keppra 2000 mg bid.  -continue decadron 2mg  in am and 4mg  in pm  -followed by Dr. Rosanne Ashing had planned to continue treatments after discharge 12. Hypoxia: Currently on 2 L per [place]  ---continue oxygen per [place]. -CXR with atelectasis and low lung volumes - encourage IS, deep breathing, OOB  -f/u cxr stable  Appears comfortable will recheck O2 sat on room air     LOS: 5 days A FACE TO FACE EVALUATION WAS PERFORMED  Charlett Blake 05/18/2020, 12:28 PM

## 2020-05-19 LAB — GLUCOSE, CAPILLARY
Glucose-Capillary: 203 mg/dL — ABNORMAL HIGH (ref 70–99)
Glucose-Capillary: 176 mg/dL — ABNORMAL HIGH (ref 70–99)
Glucose-Capillary: 136 mg/dL — ABNORMAL HIGH (ref 70–99)
Glucose-Capillary: 115 mg/dL — ABNORMAL HIGH (ref 70–99)

## 2020-05-19 NOTE — Progress Notes (Signed)
[place] PHYSICAL MEDICINE & REHABILITATION PROGRESS NOTE   Subjective/Complaints: Appears alert and comfortable in bed, O2 is on chest   ROS: Limited due to cognitive/behavioral   Objective:   No results found. No results for input(s): WBC, HGB, HCT, PLT in the last 72 hours. No results for input(s): NA, K, CL, CO2, GLUCOSE, BUN, CREATININE, CALCIUM in the last 72 hours.  Intake/Output Summary (Last 24 hours) at 05/19/2020 1115 Last data filed at 05/19/2020 1111 Gross per 24 hour  Intake 1751.54 ml  Output 800 ml  Net 951.54 ml     Pressure Injury 05/14/20 Sacrum Mid Unstageable - Full thickness tissue loss in which the base of the injury is covered by slough (yellow, tan, gray, green or brown) and/or eschar (tan, brown or black) in the wound bed. previously documented as stage 2,  (Active)  05/14/20 0132  Location: Sacrum  Location Orientation: Mid  Staging: Unstageable - Full thickness tissue loss in which the base of the injury is covered by slough (yellow, tan, gray, green or brown) and/or eschar (tan, brown or black) in the wound bed.  Wound Description (Comments): previously documented as stage 2, now unsteagable with with soft black tissue covering wound base  Present on Admission: Yes    Physical Exam: Vital Signs Blood pressure (!) 144/77, pulse 80, temperature 97.9 F (36.6 C), resp. rate 20, height 5\' 11"  (1.803 m), weight 96 kg, SpO2 100 %.  General: No acute distress Mood and affect are appropriate Heart: Regular rate and rhythm no rubs murmurs or extra sounds Lungs: Clear to auscultation, breathing unlabored, no rales or wheezes Abdomen: Positive bowel sounds, soft nontender to palpation, nondistended Extremities: No clubbing, cyanosis, or edema Skin: No evidence of breakdown, no evidence of rash  Neuro:  Pt is alert. Oriented to person, hospital. Follows basic commands.  Cranial nerves 2-12 are intact. Sensory exam is intact to PP. Reflexes are 1+ in all  4's. Mild left inattention. Motor 4/5 RUE and  4/5 LUE, BLE grossly 2/5 prox to 3+/5  Musculoskeletal: Full ROM, No pain with AROM or PROM in the neck, trunk, or extremities.       Assessment/Plan: 1. Functional deficits which require 3+ hours per day of interdisciplinary therapy in a comprehensive inpatient rehab setting.  Physiatrist is providing close team supervision and 24 hour management of active medical problems listed below.  Physiatrist and rehab team continue to assess barriers to discharge/monitor patient progress toward functional and medical goals  Care Tool:  Bathing    Body parts bathed by patient: Right arm,Left arm,Chest,Abdomen,Face   Body parts bathed by helper: Front perineal area,Buttocks,Right upper leg,Left upper leg,Right lower leg,Left lower leg     Bathing assist Assist Level: Maximal Assistance - Patient 24 - 49%     Upper Body Dressing/Undressing Upper body dressing   What is the patient wearing?: Pull over shirt    Upper body assist Assist Level: Maximal Assistance - Patient 25 - 49%    Lower Body Dressing/Undressing Lower body dressing      What is the patient wearing?: Incontinence brief     Lower body assist Assist for lower body dressing: Dependent - Patient 0%     Toileting Toileting Toileting Activity did not occur (Clothing management and hygiene only): N/A (no void or bm)  Toileting assist Assist for toileting: 2 Helpers     Transfers Chair/bed transfer  Transfers assist  Chair/bed transfer activity did not occur: Safety/medical concerns (decreased strength/activity tolerance)  Chair/bed  transfer assist level: Dependent - mechanical lift (stedy)     Locomotion Ambulation   Ambulation assist   Ambulation activity did not occur: Safety/medical concerns (decreased strength/activity tolerance)          Walk 10 feet activity   Assist  Walk 10 feet activity did not occur: Safety/medical concerns        Walk 50  feet activity   Assist Walk 50 feet with 2 turns activity did not occur: Safety/medical concerns         Walk 150 feet activity   Assist Walk 150 feet activity did not occur: Safety/medical concerns         Walk 10 feet on uneven surface  activity   Assist Walk 10 feet on uneven surfaces activity did not occur: Safety/medical concerns         Wheelchair     Assist Will patient use wheelchair at discharge?: Yes (Per PT long term goals)   Wheelchair activity did not occur: Safety/medical concerns (decreased strength/activity tolerance to transfer to w/c on eval)         Wheelchair 50 feet with 2 turns activity    Assist    Wheelchair 50 feet with 2 turns activity did not occur: Safety/medical concerns       Wheelchair 150 feet activity     Assist  Wheelchair 150 feet activity did not occur: Safety/medical concerns       Blood pressure (!) 144/77, pulse 80, temperature 97.9 F (36.6 C), resp. rate 20, height 5\' 11"  (1.803 m), weight 96 kg, SpO2 100 %.  Medical Problem List and Plan: 1.Functional and mobility deficitssecondary to debility after recurrent seizures and associated respiratory failure. Resolving encephalopathy.  Pt with history of GBM/resection 06/2019 -patient may shower -ELOS/Goals: 14-18 days, min to mod assist with PT, OT, SLP  -therapies as tolerated 2. Antithrombotics: -DVT/anticoagulation:Pharmaceutical:Heparin -antiplatelet therapy: N/a 3. Pain Management:Tylenol prn 4. Mood/agitation: -antipsychotic agents: will add low dose seroquel to help with sundowning  -limit benzos  -continue pyridoxine 5. Neuropsych/AMS: This patientis not fullycapable of making decisions onhisown behalf.  1/27-28  increased lethargy and left sided weakness   -UA+, UCX pending---empiric rocephin , ecoli pansensitive switched to amoxil   -Head CT demonstrates gradual increase in edema and  leftward shift. Have reached out to [name] regarding any recommendations at this point 6. Skin/Wound Care:Routine pressure relief measures. -prevalon boots for bilateral heels -encourage adequate PO intake 7. Fluids/Electrolytes/Nutrition:encourage PO  -low protein state   -offering protein supps  8. HTN: Monitor BP tid--continue Norvasc and metoprolol  -fair control 9. Steroid induced hyperglycemia: Monitor BS ac/hs and use SSI for elevated BS.  -if plan is to continue decadron, he'll need to be on something scheduled to better control CBG's   -began amaryl 1mg  daily 1/25--with improvement 10. GBM/Seizures: Has been seizure free on Keppra 2000 mg bid.  -continue decadron 2mg  in am and 4mg  in pm  -followed by Dr. Rosanne Ashing had planned to continue treatments after discharge 12. Hypoxia: Currently on 2 L per [place]  ---continue oxygen per [place]. -CXR with atelectasis and low lung volumes - encourage IS, deep breathing, OOB  -f/u cxr stable  Appears comfortable will recheck O2 sat on room air     LOS: 6 days A FACE TO FACE EVALUATION WAS PERFORMED  Charlett Blake 05/19/2020, 11:15 AM

## 2020-05-19 NOTE — Progress Notes (Signed)
Speech Language Pathology Daily Session Note  Patient Details  Name: [name] MRN: [id number] Date of Birth: July 14, 1945  Today's Date: 05/19/2020 SLP Individual Time: 1505-1530 SLP Individual Time Calculation (min): 25 min  Short Term Goals: Week 1: SLP Short Term Goal 1 (Week 1): Pt will sustain attention to functional tasks for 3-5 minute intervals with Max A cues for redirection. SLP Short Term Goal 2 (Week 1): Pt will demonstrate recall of 1 safety precaution with Max A cues for use of strategies or aids. SLP Short Term Goal 3 (Week 1): Pt will orient to date, place, and situation with Max A cues for use of external aids or other strategies. SLP Short Term Goal 4 (Week 1): Pt will identify 1 cognitive and 1 physical impairment in setting of acute hospitalization with Max A multimodal cues. SLP Short Term Goal 5 (Week 1): Pt will use strategies for speech fluency/reducing verbal repetitions with Max A multimodal cues. SLP Short Term Goal 6 (Week 1): Pt will consume dys 3 textures and thin liquids with min cues for use of swallowing precautions and minimal overt s/s of aspiration.  Skilled Therapeutic Interventions:   Pt was seen for skilled ST targeting goals for dysphagia and cognition.  Upon arrival, pt was with nursing being cleaned after having had a bowel movement on the bedside commode.  Pt exhibited hypersensitivity to cold and decreased frustration tolerance when being cleaned up but was easily redirectable.  Shortly after nursing left, pt reported that he had to urinate and briefly became verbally agitated while waiting for placement of urinal but again he was redirectable.  Overall, pt has very poor frustration for any discomfort at this time which at times leads to him lashing out or becoming distractible; however, he remains willing to participate in treatment and easy to de-escalate with calm voice, addressing irritating factors, and explanation of what is being expected of  or done to him.  Pt consumed small sips of thin liquids via straw with no overt s/s of aspiration.  Similar to previous session, pt had a delayed cough following sips of water and wife reports noticing some coughing well after completion of meal time this afternoon but these did not appear to directly correlate with PO intake.  Pt was left in bed with wife at bedside.  Continue per current plan of care.    Pain Pain Assessment Pain Scale: 0-10 Pain Score: 0-No pain  Therapy/Group: Individual Therapy  Kili Gracy, Selinda Orion 05/19/2020, 4:13 PM

## 2020-05-20 ENCOUNTER — Ambulatory Visit: Payer: Medicare Other | Admitting: Internal Medicine

## 2020-05-20 LAB — GLUCOSE, CAPILLARY
Glucose-Capillary: 189 mg/dL — ABNORMAL HIGH (ref 70–99)
Glucose-Capillary: 248 mg/dL — ABNORMAL HIGH (ref 70–99)
Glucose-Capillary: 176 mg/dL — ABNORMAL HIGH (ref 70–99)
Glucose-Capillary: 180 mg/dL — ABNORMAL HIGH (ref 70–99)

## 2020-05-20 LAB — CBC
HCT: 32.2 % — ABNORMAL LOW (ref 39.0–52.0)
Hemoglobin: 10.8 g/dL — ABNORMAL LOW (ref 13.0–17.0)
MCH: 30 pg (ref 26.0–34.0)
MCHC: 33.5 g/dL (ref 30.0–36.0)
MCV: 89.4 fL (ref 80.0–100.0)
Platelets: 144 10*3/uL — ABNORMAL LOW (ref 150–400)
RBC: 3.6 MIL/uL — ABNORMAL LOW (ref 4.22–5.81)
RDW: 14.3 % (ref 11.5–15.5)
WBC: 3.5 10*3/uL — ABNORMAL LOW (ref 4.0–10.5)
nRBC: 0 % (ref 0.0–0.2)

## 2020-05-20 LAB — BASIC METABOLIC PANEL
Anion gap: 8 (ref 5–15)
BUN: 12 mg/dL (ref 8–23)
CO2: 29 mmol/L (ref 22–32)
Calcium: 8.6 mg/dL — ABNORMAL LOW (ref 8.9–10.3)
Chloride: 101 mmol/L (ref 98–111)
Creatinine, Ser: 0.54 mg/dL — ABNORMAL LOW (ref 0.61–1.24)
GFR, Estimated: >60 mL/min (ref >60)
Glucose, Bld: 210 mg/dL — ABNORMAL HIGH (ref 70–99)
Potassium: 4.2 mmol/L (ref 3.5–5.1)
Sodium: 138 mmol/L (ref 135–145)

## 2020-05-20 NOTE — Progress Notes (Signed)
PHYSICAL MEDICINE & REHABILITATION PROGRESS NOTE   Subjective/Complaints: Pt with a reasonable night. No new complaints. More alert than he was at the end of last week. Able to participate in OT eariler this morning  ROS: Limited due to cognitive/behavioral   Objective:   No results found. Recent Labs    05/20/20 0630  WBC 3.5*  HGB 10.8*  HCT 32.2*  PLT 144*   Recent Labs    05/20/20 0630  NA 138  K 4.2  CL 101  CO2 29  GLUCOSE 210*  BUN 12  CREATININE 0.54*  CALCIUM 8.6*    Intake/Output Summary (Last 24 hours) at 05/20/2020 1105 Last data filed at 05/20/2020 2671 Gross per 24 hour  Intake 1105.17 ml  Output 350 ml  Net 755.17 ml     Pressure Injury 05/14/20 Sacrum Mid Unstageable - Full thickness tissue loss in which the base of the injury is covered by slough (yellow, tan, gray, green or brown) and/or eschar (tan, brown or black) in the wound bed. previously documented as stage 2,  (Active)  05/14/20 0132  Location: Sacrum  Location Orientation: Mid  Staging: Unstageable - Full thickness tissue loss in which the base of the injury is covered by slough (yellow, tan, gray, green or brown) and/or eschar (tan, brown or black) in the wound bed.  Wound Description (Comments): previously documented as stage 2, now unsteagable with with soft black tissue covering wound base  Present on Admission: Yes    Physical Exam: Vital Signs Blood pressure 140/90, pulse 82, temperature 98.6 F (37 C), resp. rate 20, height 5\' 11"  (1.803 m), weight 96 kg, SpO2 96 %.  Constitutional: No distress . Vital signs reviewed. HEENT: EOMI, oral membranes moist Neck: supple Cardiovascular: RRR without murmur. No JVD    Respiratory/Chest: CTA Bilaterally without wheezes or rales. Normal effort    GI/Abdomen: BS +, non-tender, non-distended Ext: no clubbing, cyanosis, or edema Psych: pleasant and cooperative Skin: sacral wound, heels boggy/chronic changes R>L  Neuro:  Pt  is alert. Oriented to person, hospital. Follows basic commands.  Cranial nerves 2-12 are intact. Sensory exam is intact to PP. Reflexes are 1+ in all 4's. Mild left inattention. Motor 4/5 RUE and  4/5 LUE, BLE grossly 2/5 prox to 3+/5  Musculoskeletal: Full ROM, No pain with AROM or PROM in the neck, trunk, or extremities.       Assessment/Plan: 1. Functional deficits which require 3+ hours per day of interdisciplinary therapy in a comprehensive inpatient rehab setting.  Physiatrist is providing close team supervision and 24 hour management of active medical problems listed below.  Physiatrist and rehab team continue to assess barriers to discharge/monitor patient progress toward functional and medical goals  Care Tool:  Bathing    Body parts bathed by patient: Right arm,Left arm,Chest,Abdomen,Face   Body parts bathed by helper: Front perineal area,Buttocks,Right upper leg,Left upper leg,Right lower leg,Left lower leg     Bathing assist Assist Level: Maximal Assistance - Patient 24 - 49%     Upper Body Dressing/Undressing Upper body dressing   What is the patient wearing?: Pull over shirt    Upper body assist Assist Level: Maximal Assistance - Patient 25 - 49%    Lower Body Dressing/Undressing Lower body dressing      What is the patient wearing?: Incontinence brief     Lower body assist Assist for lower body dressing: Dependent - Patient 0%     Toileting Toileting Toileting Activity did not occur (  Clothing management and hygiene only): N/A (no void or bm)  Toileting assist Assist for toileting: 2 Helpers     Transfers Chair/bed transfer  Transfers assist  Chair/bed transfer activity did not occur: Safety/medical concerns (decreased strength/activity tolerance)  Chair/bed transfer assist level: Dependent - mechanical lift (stedy)     Locomotion Ambulation   Ambulation assist   Ambulation activity did not occur: Safety/medical concerns (decreased  strength/activity tolerance)          Walk 10 feet activity   Assist  Walk 10 feet activity did not occur: Safety/medical concerns        Walk 50 feet activity   Assist Walk 50 feet with 2 turns activity did not occur: Safety/medical concerns         Walk 150 feet activity   Assist Walk 150 feet activity did not occur: Safety/medical concerns         Walk 10 feet on uneven surface  activity   Assist Walk 10 feet on uneven surfaces activity did not occur: Safety/medical concerns         Wheelchair     Assist Will patient use wheelchair at discharge?: Yes (Per PT long term goals)   Wheelchair activity did not occur: Safety/medical concerns (decreased strength/activity tolerance to transfer to w/c on eval)         Wheelchair 50 feet with 2 turns activity    Assist    Wheelchair 50 feet with 2 turns activity did not occur: Safety/medical concerns       Wheelchair 150 feet activity     Assist  Wheelchair 150 feet activity did not occur: Safety/medical concerns       Blood pressure 140/90, pulse 82, temperature 98.6 F (37 C), resp. rate 20, height 5\' 11"  (1.803 m), weight 96 kg, SpO2 96 %.  Medical Problem List and Plan: 1.Functional and mobility deficitssecondary to debility after recurrent seizures and associated respiratory failure. Resolving encephalopathy.  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 Antithrombotics: -DVT/anticoagulation:Pharmaceutical:Heparin. Following platelets -antiplatelet therapy: N/a 3. Pain Management:Tylenol prn 4. Mood/agitation: -antipsychotic agents: continue low dose seroquel to help with sundowning  -limit benzos  -continue pyridoxine 5. Neuropsych/AMS: This patientis not fullycapable of making decisions onhisown behalf.  1/27-28   increased lethargy and left sided weakness   1/31-EColi--pt now on amoxil to complete course of therapy    -looks better    -spoke with [name] on Friday and he recommended increasing decadron to 4mg  bid and will see him in the office given changes on Thursday's HCT 6. Skin/Wound Care:Routine pressure relief measures. -prevalon boots for bilateral heels -encourage adequate PO intake 7. Fluids/Electrolytes/Nutrition:encourage PO  -low protein state   -offering protein supps   -1/31 I personally reviewed the patient's labs today.  WNL 8. HTN: Monitor BP tid--continue Norvasc and metoprolol  -fair control 9. Steroid induced hyperglycemia: Monitor BS ac/hs and use SSI for elevated BS.  -if plan is to continue decadron, he'll need to be on something scheduled to better control CBG's   -began amaryl 1mg  daily 1/25--with improvement 10. GBM/Seizures: Has been seizure free on Keppra 2000 mg bid.  -continue decadron 4mg  bid as above  -see #5  -platelets up to 144 12. Hypoxia: Currently on 2 L per [place]  ---continue oxygen per [place]. -CXR with atelectasis and low lung volumes - encourage IS, deep breathing, OOB  -f/u cxr  stable        LOS: 7 days A FACE TO FACE EVALUATION WAS PERFORMED  Meredith Staggers 05/20/2020, 11:05 AM

## 2020-05-20 NOTE — Progress Notes (Signed)
Occupational Therapy Session Note  Patient Details  Name: [name] MRN: [id number] Date of Birth: 20-Oct-1945  Today's Date: 05/20/2020 OT Individual Time: 8295-6213 OT Individual Time Calculation (min): 45 min    Short Term Goals: Week 1:  OT Short Term Goal 1 (Week 1): Pt will maintain EOB sitting balance with mod A OT Short Term Goal 2 (Week 1): Pt will set up grooming tasks with supervision to demo improved motor planning OT Short Term Goal 3 (Week 1): Pt will don shirt with mod A to demo improved sequencing OT Short Term Goal 4 (Week 1): Pt will require no more than min cueing to attend to dressing task  Skilled Therapeutic Interventions/Progress Updates:    Pt received supine with no c/o pain at rest. Pt reporting no incontinence yet upon check he had small incontinent BM and some urine. Pt completed rolling R and L with mod multimodal cueing and max A overall. Pt required total A to don pants at bed level. Pt completed supine > sitting EOB transfer with mod +2 assist. Pt required mod cueing for initiation of UB bathing and dressing. Poor sequencing and motor planning deficits to orient shirt. He required mod A to don shirt. Pt stood with RW from EOB with max +2 assist. He was initially very stedy on his feet with mod +1 assist. He took 2 steps to the L and then had sudden descent, unclear if this was d/t fatigue or pt thinking he was closer to the chair than he was. Quick +2 assist to safely bring pt to the chair. He completed oral care at the sink with set up assist, poor motor planning. Pt was left sitting up in the w/c with all needs met, on 2L O2 , SLP entering room.   Therapy Documentation Precautions:  Precautions Precautions: Fall Precaution Comments: Peripheral visual field cut L>R, seizures Restrictions Weight Bearing Restrictions: No Other Position/Activity Restrictions: on 2-4 L/min O2   Therapy/Group: Individual Therapy  Curtis Sites 05/20/2020, 6:15 AM

## 2020-05-20 NOTE — Progress Notes (Signed)
Physical Therapy Session Note  Patient Details  Name: [name] MRN: [id number] Date of Birth: [birth year]  Today's Date: 05/20/2020 PT Individual Time: 0915-1030 and 1325-1405 PT Individual Time Calculation (min): 75 min and 40 min   Short Term Goals: Week 1:  PT Short Term Goal 1 (Week 1): Patient will perform bed mobility with mod A consistently. PT Short Term Goal 2 (Week 1): Patient will perform basic transfers with max A +2 using LRAD. PT Short Term Goal 3 (Week 1): Patient will sit OOB >1 hour 2x per day 4/7 days this week.  Skilled Therapeutic Interventions/Progress Updates:     Session 1: Patient in TIS w/c upon PT arrival. Patient alert and agreeable to PT session. Patient denied pain during session. Patient reporting stomach pain and bowl urgency and urinary incontinence at beginning of session.  Patient on 2L/min throughout session, SPO2 95-98% throughout.  Therapeutic Activity: Bed Mobility: Patient performed sit to supine with max A +2 for trunk and lower extremity management due to patient fatigue and poor motor planning. Provided verbal cues for timing and sequencing. Performed rolling R/L with mod-max A +2 due to patient fatigue for peri-care and lower body clothing management with total A.  Transfers: Patient performed sit to/from stand x1 in the [place] to transfer to [facility] with max A +2 and RN in front for visual target for forward weight shift. Provided verbal cues for forward weight shift, hand placement, and foot placement. Patient with poor motor planning and attention requiring several cues for hand placement with hand-over hand assist and x2 attempts to come to standing. Patient transferred to [facility] with decreased control for descent. Patient continent of bowl and bladder on BSC. Patient appeared more fatigued after bowl movement and required several attempts to stand in the [place], as above, without success. Required max +3 to stand form the BSC (elevated to tallest  setting prior to toileting). Patient then returned to the bed using Stedy. Doffed/donned incontinence brief, pants, and shoes with patient sitting on BSC with total A, without pulling brief and pants above his hips. Performed peri-care and pulled pants and incontinence brief up bed level due to patient unable to maintain standing in the [place].  Patient required significant time with all mobility due to poor motor planning and cue following throughout session. Patient frequently following cues then moving his hands of the Stedy or placing his R foot behind the foot plate and requiring max cues and facilitation to re-set.   Seat cushion soiled in TIS w/c, removed cover and placed in washer and cushion cleaned, rehab tech made aware.   Patient in bed at end of session with breaks locked, bed alarm set, and all needs within reach.   Session 2: Patient in bed asleep with SPO2 on his forehead with his wife at bedside upon PT arrival. Patient alert and agreeable to PT session. Patient denied pain during session, but reporting extreme discomfort lying in the bed.   SPO2 88% on RA, placed patient on 2L/min and recovered to 96% <1 min. Maintained on 2L/min throughout session, SPO2 >95%.  Therapeutic Activity: Bed Mobility: Patient performed rolling R/L with mod A with cues and hand-over-hand assist for reaching to the bed rail and bringing his knees to each side. He performed supine to/from sit with mod-max A with increased time for initiation and motor planning. Provided verbal cues for rolling L, use of bed rail with R hand to push up, and sequencing throughout.   Neuromuscular Re-ed: Patient  performed the following activities EOB listening to Merry Proud for improved patient mood and activity tolerance: -performed sitting balance x8 min progressing from min A to supervision working on midline orientation due to L posterior lean, progressed from B upper extremity support to no upper extremity  support -performed alternating upper extremity overhead reaching x1 min to rhythm of the music with supervision -performed toe taps, heel taps, and heel taps with hip flexion lifting his foot off the floor with manual facilitation, focused on tapping on rhythm for improved initiation  Discussed d/c planning and PT goals with patient and his wife during session. Patient and his wife state that they want to go home from rehab. Patient's wife hopes to be able to transfer the patient for toilet transfers form the bed and at least have her son perform transfers for the patient to sit OOB in the evenings. In agreement that mod A goals and open to continued discussions with palliative care as appropriate.  Patient in bed positioned in L side-lying with pillows for pressure relief with his wife at bedside at end of session with breaks locked, bed alarm set, and all needs within reach.     Therapy Documentation Precautions:  Precautions Precautions: Fall Precaution Comments: Peripheral visual field cut L>R, seizures Restrictions Weight Bearing Restrictions: No Other Position/Activity Restrictions: on 2-4 L/min O2   Therapy/Group: Individual Therapy  Marquette Blodgett L Kacee Sukhu PT, DPT  05/20/2020, 3:37 PM

## 2020-05-20 NOTE — Progress Notes (Signed)
Speech Language Pathology Daily Session Note  Patient Details  Name: [name] MRN: [id number] Date of Birth: Aug 28, 1945  Today's Date: 05/20/2020 SLP Individual Time: 0815-0900 SLP Individual Time Calculation (min): 45 min  Short Term Goals: Week 1: SLP Short Term Goal 1 (Week 1): Pt will sustain attention to functional tasks for 3-5 minute intervals with Max A cues for redirection. SLP Short Term Goal 2 (Week 1): Pt will demonstrate recall of 1 safety precaution with Max A cues for use of strategies or aids. SLP Short Term Goal 3 (Week 1): Pt will orient to date, place, and situation with Max A cues for use of external aids or other strategies. SLP Short Term Goal 4 (Week 1): Pt will identify 1 cognitive and 1 physical impairment in setting of acute hospitalization with Max A multimodal cues. SLP Short Term Goal 5 (Week 1): Pt will use strategies for speech fluency/reducing verbal repetitions with Max A multimodal cues. SLP Short Term Goal 6 (Week 1): Pt will consume dys 3 textures and thin liquids with min cues for use of swallowing precautions and minimal overt s/s of aspiration.  Skilled Therapeutic Interventions: Skilled treatment session focused on dysphagia and cognitive goals. SLP facilitated session by providing Mod A verbal cues for sustained attention to self-feeding due to verbosity. Mod verbal cues were also needed for orientation to time and place. Patient consumed breakfast meal of Dys. 3 textures with thin liquids with intermittent overt s/s of aspiration, suspect to large bites with attempting to talk with a full oral cavity. Coughing was eliminated with bite sizes were managed with Min verbal cues. Recommend patient continue current diet. Throughout session, patient was pleasant and cooperative. Patient handed off to NT. Continue with current plan of care.      Pain Pain Assessment Pain Scale: 0-10 Pain Score: 0-No pain Faces Pain Scale: No hurt Pain Type: Acute  pain  Therapy/Group: Individual Therapy  Bion Todorov 05/20/2020, 12:45 PM

## 2020-05-21 ENCOUNTER — Telehealth: Payer: Self-pay | Admitting: Internal Medicine

## 2020-05-21 LAB — GLUCOSE, CAPILLARY
Glucose-Capillary: 187 mg/dL — ABNORMAL HIGH (ref 70–99)
Glucose-Capillary: 193 mg/dL — ABNORMAL HIGH (ref 70–99)
Glucose-Capillary: 198 mg/dL — ABNORMAL HIGH (ref 70–99)
Glucose-Capillary: 139 mg/dL — ABNORMAL HIGH (ref 70–99)

## 2020-05-21 NOTE — Progress Notes (Addendum)
Patient was noted calling for help this morning. When this nurse went into the room, patient was sitting in his wheelchair. Call bell was by him on the bed. He stated that he needed to go to the restroom.  Staff got him in the bed & he was placed on a bed pan. Noted that his left arm was bleeding from his dressing. The dressing was halfway off. Arm was cleansed, looks like a skin tear. His arm was redressed, he was taken off the bedpan with no results. He was left in bed with the call light at his side. Report of occurrence was given to his nurse.

## 2020-05-21 NOTE — Progress Notes (Signed)
Patient ID: [name], male   DOB: 1946-04-14, 75 y.o.   MRN: [id number]  SW met with pt and pt wife in room to provide updates from team conference, and d/c date 2/22. SW discussed with pt wife additional support recommended for her to have with helping to manage pt care needs due to goal at d/c will be Mod A and will d/c at wheelchair level at this time. When discussing SNF as an alternative if unable to find support, wife objected and stated because of COVID she would prefer not too, and will be working on having additional support at home with her. SW encouraged her to plan for 24/7 care additional support at this time, and if it is not needed, we will provide updates as pt is assessed each week on progress. Wife states that her son [name] that lives locally will be able to help every other weekend. SW discussed family education week before discharge. She intends to speak with both of her sons about coming in for family education as well. She will confirm with SW on date(s).  [name], MSW, [place] Office: [phone] Cell: [phone] Fax: [phone]

## 2020-05-21 NOTE — Patient Care Conference (Signed)
Inpatient RehabilitationTeam Conference and Plan of Care Update Date: 05/21/2020   Time: 10:20 AM    Patient Name: [name]      Medical Record Number: [id number]  Date of Birth: 12-Dec-1945 Sex: Male         Room/Bed: 4W11C/4W11C-01 Payor Info: Payor: MEDICARE / Plan: MEDICARE PART A AND B / Product Type: *No Product type* /    Admit Date/Time:  05/13/2020  3:12 PM  Primary Diagnosis:  Physical debility  Hospital Problems: Principal Problem:   Physical debility    Expected Discharge Date: Expected Discharge Date: 06/11/20  Team Members Present: Physician leading conference: Dr. Alger Simons Care Coodinator Present: Dorthula Nettles, RN, BSN, CRRN;[name], LCSWA Nurse Present: Isla Pence, RN PT Present: Magda Kiel, PT OT Present: Laverle Hobby, OT SLP Present: Weston Anna, SLP PPS Coordinator present : Ileana Ladd, Burna Mortimer, SLP     Current Status/Progress Goal Weekly Team Focus  Bowel/Bladder   Continent with incontinent episodes  Less periods of incontinence  timed toileting q2 hours and PRN   Swallow/Nutrition/ Hydration   Dys. 3 textures with thin liquids, Min A for use of swallow strategies  Supervision  use of swallow strategies   ADL's   Some improvements in UB ADLs- mod A for dressing, total A for LB ADLs. Max +2 stedy transfers  mod A overall for ADLs  Motor planning, ADL retraining, transfers, cognition   Mobility   Mod-max A bed mobility, max +2 Stedy transfers, mobility fluctuates due to poor motor planning, cognitive deficits, and fear of falling  Mod A mobiilty, gait 25 ft, supervision w/c level  Activity tolerance, functional mobility, motor planning, balance, cognitive remediation, coordination, ROM, patient/caregiver education   Communication   Supervision  Supervision  self-monitor and correct word level repetitions   Safety/Cognition/ Behavioral Observations  Overall Mod A  Supervision-Min A  orientation, basic problem  solving, sustained attention   Pain   complaints of pain in the sacrum  Pain score <3/10  assess pain q 4 hr and prn   Skin   Stage 2 to the sacrum  Prevent further skin breakdown, turn and reposition pt q 2hours  assess skin q shift and prn     Discharge Planning:  D/c to home with wife who will provide 24/7care. She is working on additional natural supports. One of their adult son's lives locally and will assist PRN. Outpatient palliative care referral made.   Team Discussion: Presented with more debility, more AMS. CT showed progression, from the UTI standpoint patient is looking better. Patient has a left lean, is continent with incontinent episodes. Has NS @ 75 mL/hr continuous. Is eating better. He has a Stage 2 to the sacrum and is being treated appropriately. Wife is primary caregiver. She is not doing much better herself but she feels obligated to be there for him because he was there for her when she was sick.  Patient on target to meet rehab goals: Slow going, total assist for lower body ADL's, max assist for upper body ADL's. Recommending SNF. Mod-max assist for bed mobility, max +2 for standing. Dys 2, thin, basic problem solving, orientation, and sustained attention. Hoping to not go home on oxygen.   *See Care Plan and progress notes for long and short-term goals.   Revisions to Treatment Plan:  Increased steroids, rx UTI.  Teaching Needs: Continue family education, medication management, wound/skin care, transfer training, gait training, toilet training  Current Barriers to Discharge: Inaccessible home environment, Decreased  caregiver support, Medical stability, Home enviroment access/layout, Incontinence, Wound care, Lack of/limited family support, Weight, Medication compliance, Pending chemo/radiation, Behavior and Nutritional means  Possible Resolutions to Barriers: Continue current medications, offer nutritional support, provide emotional support to patient and family.      Medical Summary Current Status: GBM with progressive disease, admitted with seizures. increased lethargy likely related to UTI.  Barriers to Discharge: Medical stability   Possible Resolutions to Barriers/Weekly Focus: rx uti, increased steroids, daily assessment of labs   Continued Need for Acute Rehabilitation Level of Care: The patient requires daily medical management by a physician with specialized training in physical medicine and rehabilitation for the following reasons: Direction of a multidisciplinary physical rehabilitation program to maximize functional independence : Yes Medical management of patient stability for increased activity during participation in an intensive rehabilitation regime.: Yes Analysis of laboratory values and/or radiology reports with any subsequent need for medication adjustment and/or medical intervention. : Yes   I attest that I was present, lead the team conference, and concur with the assessment and plan of the team.   Dorthula Nettles G 05/21/2020, 5:00 PM

## 2020-05-21 NOTE — Telephone Encounter (Signed)
Called patient to schedule appointment per 1/31 sch msg. Spoke to patient's wife who said patient is still in hospital and she was unsure of discharge date. She wanted to hold off scheduling for now and stated she would call back to schedule f/u. Gave her phone number to contact to schedule appointment.

## 2020-05-21 NOTE — Progress Notes (Signed)
PHYSICAL MEDICINE & REHABILITATION PROGRESS NOTE   Subjective/Complaints: Feeling better. Eating breakfast with NT. Denies pain. Says he slept  ROS: Patient denies fever, rash, sore throat, blurred vision, nausea, vomiting, diarrhea, cough, shortness of breath or chest pain, joint or back pain, headache, or mood change.   Objective:   No results found. Recent Labs    05/20/20 0630  WBC 3.5*  HGB 10.8*  HCT 32.2*  PLT 144*   Recent Labs    05/20/20 0630  NA 138  K 4.2  CL 101  CO2 29  GLUCOSE 210*  BUN 12  CREATININE 0.54*  CALCIUM 8.6*    Intake/Output Summary (Last 24 hours) at 05/21/2020 1023 Last data filed at 05/21/2020 6283 Gross per 24 hour  Intake 720 ml  Output 475 ml  Net 245 ml     Pressure Injury 05/14/20 Sacrum Mid Unstageable - Full thickness tissue loss in which the base of the injury is covered by slough (yellow, tan, gray, green or brown) and/or eschar (tan, brown or black) in the wound bed. previously documented as stage 2,  (Active)  05/14/20 0132  Location: Sacrum  Location Orientation: Mid  Staging: Unstageable - Full thickness tissue loss in which the base of the injury is covered by slough (yellow, tan, gray, green or brown) and/or eschar (tan, brown or black) in the wound bed.  Wound Description (Comments): previously documented as stage 2, now unsteagable with with soft black tissue covering wound base  Present on Admission: Yes    Physical Exam: Vital Signs Blood pressure (!) 152/97, pulse 94, temperature 98.3 F (36.8 C), resp. rate 17, height 5\' 11"  (1.803 m), weight 96 kg, SpO2 97 %.  Constitutional: No distress . Vital signs reviewed. Large man HEENT: EOMI, oral membranes moist Neck: supple Cardiovascular: RRR without murmur. No JVD    Respiratory/Chest: CTA Bilaterally without wheezes or rales. Normal effort    GI/Abdomen: BS +, non-tender, non-distended Ext: no clubbing, cyanosis, or edema Psych: pleasant and  cooperative Skin: sacral wound not visualized today, heels remain boggy/chronic changes R>L  Neuro:  Very alert. Oriented to person, place, reason. Follows basic commands. Delayed processing still.  Cranial nerves 2-12 are intact. Sensory exam is intact to PP. Reflexes are 1+ in all 4's. Mild left inattention. Motor 4/5 RUE and  4/5 LUE, BLE grossly 2/5 prox to 3+/5  Musculoskeletal: Full ROM, No pain with AROM or PROM in the neck, trunk, or extremities.       Assessment/Plan: 1. Functional deficits which require 3+ hours per day of interdisciplinary therapy in a comprehensive inpatient rehab setting.  Physiatrist is providing close team supervision and 24 hour management of active medical problems listed below.  Physiatrist and rehab team continue to assess barriers to discharge/monitor patient progress toward functional and medical goals  Care Tool:  Bathing    Body parts bathed by patient: Right arm,Left arm,Chest,Abdomen,Face   Body parts bathed by helper: Front perineal area,Buttocks,Right upper leg,Left upper leg,Right lower leg,Left lower leg     Bathing assist Assist Level: Maximal Assistance - Patient 24 - 49%     Upper Body Dressing/Undressing Upper body dressing   What is the patient wearing?: Pull over shirt    Upper body assist Assist Level: Maximal Assistance - Patient 25 - 49%    Lower Body Dressing/Undressing Lower body dressing      What is the patient wearing?: Incontinence brief     Lower body assist Assist for lower body  dressing: Dependent - Patient 0%     Toileting Toileting Toileting Activity did not occur Landscape architect and hygiene only): N/A (no void or bm)  Toileting assist Assist for toileting: 2 Helpers     Transfers Chair/bed transfer  Transfers assist  Chair/bed transfer activity did not occur: Safety/medical concerns (decreased strength/activity tolerance)  Chair/bed transfer assist level: Dependent - mechanical lift  (stedy)     Locomotion Ambulation   Ambulation assist   Ambulation activity did not occur: Safety/medical concerns (decreased strength/activity tolerance)          Walk 10 feet activity   Assist  Walk 10 feet activity did not occur: Safety/medical concerns        Walk 50 feet activity   Assist Walk 50 feet with 2 turns activity did not occur: Safety/medical concerns         Walk 150 feet activity   Assist Walk 150 feet activity did not occur: Safety/medical concerns         Walk 10 feet on uneven surface  activity   Assist Walk 10 feet on uneven surfaces activity did not occur: Safety/medical concerns         Wheelchair     Assist Will patient use wheelchair at discharge?: Yes (Per PT long term goals)   Wheelchair activity did not occur: Safety/medical concerns (decreased strength/activity tolerance to transfer to w/c on eval)         Wheelchair 50 feet with 2 turns activity    Assist    Wheelchair 50 feet with 2 turns activity did not occur: Safety/medical concerns       Wheelchair 150 feet activity     Assist  Wheelchair 150 feet activity did not occur: Safety/medical concerns       Blood pressure (!) 152/97, pulse 94, temperature 98.3 F (36.8 C), resp. rate 17, height 5\' 11"  (1.803 m), weight 96 kg, SpO2 97 %.  Medical Problem List and Plan: 1.Functional and mobility deficitssecondary to debility after recurrent seizures and associated respiratory failure. Resolving encephalopathy.  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 Antithrombotics: -DVT/anticoagulation:Pharmaceutical:Heparin. Following platelets -antiplatelet therapy: N/a 3. Pain Management:Tylenol prn 4. Mood/agitation: -antipsychotic agents: continue low dose seroquel to help with  sundowning  -limit benzos  -continue pyridoxine 5. Neuropsych/AMS: This patientis not fullycapable of making decisions onhisown behalf.  1/27-28  increased lethargy and left sided weakness   1/31-2/1: EColi--pt now on amoxil to complete course of therapy (10d)    -looks better    -spoke with [name] on Friday and he recommended increasing decadron to 4mg  bid and will see him in the office given changes on Thursday's HCT 6. Skin/Wound Care:Routine pressure relief measures. -prevalon boots for bilateral heels -encourage adequate PO intake 7. Fluids/Electrolytes/Nutrition:encourage PO  -low protein state   -offering protein supps   -2/1 continue push fluids, will dc IV 8. HTN: Monitor BP tid--continue Norvasc and metoprolol  -fair control 9. Steroid induced hyperglycemia: Monitor BS ac/hs and use SSI for elevated BS.  -if plan is to continue decadron, he'll need to be on something scheduled to better control CBG's   -began amaryl 1mg  daily 1/25--with improvement 10. GBM/Seizures: Has been seizure free on Keppra 2000 mg bid.  -continue decadron 4mg  bid as above  -see #5  -[name] can engage hospice as appropriate as outpt 12. Hypoxia: Currently on 2 L per  [place]    ---continue oxygen per [place].wean as able. May need to go home on O2 -most recent CXR with atelectasis and low lung volumes - encourage IS, deep breathing, OOB          LOS: 8 days A FACE TO FACE EVALUATION WAS PERFORMED  Meredith Staggers 05/21/2020, 10:23 AM

## 2020-05-21 NOTE — Progress Notes (Signed)
Physical Therapy Session Note  Patient Details  Name: [name] MRN: [id number] Date of Birth: [birth year]  Today's Date: 05/21/2020 PT Individual Time: 1305-1430 PT Individual Time Calculation (min): 85 min   Short Term Goals: Week 2:  PT Short Term Goal 1 (Week 1): Patient will perform bed mobility with mod A consistently. PT Short Term Goal 2 (Week 1): Patient will perform basic transfers with max A +2 using LRAD. PT Short Term Goal 3 (Week 1): Patient will sit OOB >1 hour 2x per day 4/7 days this week.  Skilled Therapeutic Interventions/Progress Updates:    Patient in supine and encouragement needed to participate.  Wife present and also assisted to encourage pt. Patient rolled to side mod A with nursing needing to perform dressing change on sacrum. Side to sit max A to lift trunk and cues for holding foot of bed to R due to L lateral lean.  Cues for forward gaze on upright sink faucet for visual feedback about vertical.  Patient seated at EOB for work on cervical AROM R lateral flexion and extension.  Attempted sit to stand to [place], but pt would not engage LE's to assist and significantly limited by fear.  Attempted x 3 and even with blanket under hips to assist to lift, but pt still resisting.  Sit to supine mod A for LE's and pt allowing trunk to fall with uncontrolled descent.  Assist for positioning on back for resting in supine.  Supine to sit max A from L sidelying.  Sit to stand using Clarise Cruz Plus with +2 for reassurance and transferred to w/c.  Assisted to dayroom and pt in w/c to perform 3 x 10 reps on Kinetron at 50 cm/sec.  Assisted back to room and used Clarise Cruz Plus for back to bed.  Patient in supine to perform 2 x 10 sidelying clamshell hip abduction, 2 x 10 reps bridging with A and mod cues.  Scooted to [facility] with RN +2 A and left with RN and wife in the room.   Therapy Documentation Precautions:  Precautions Precautions: Fall Precaution Comments: Peripheral visual field cut  L>R, seizures Restrictions Weight Bearing Restrictions: No Other Position/Activity Restrictions: on 2-4 L/min O2 Pain: Pain Assessment Pain Scale: Faces Pain Score: 0-No pain Faces Pain Scale: Hurts little more Pain Type: Acute pain Pain Location: Buttocks Pain Orientation: Medial Pain Descriptors / Indicators: Discomfort Pain Onset: With Activity (sitting) Pain Intervention(s): Repositioned    Therapy/Group: Individual Therapy  Reginia Naas  Magda Kiel, PT 05/21/2020, 5:01 PM

## 2020-05-21 NOTE — Progress Notes (Signed)
Speech Language Pathology Daily Session Note  Patient Details  Name: [name] MRN: [id number] Date of Birth: [birth year]  Today's Date: 05/21/2020 SLP Individual Time: 1432-1530 SLP Individual Time Calculation (min): 58 min  Short Term Goals: Week 1: SLP Short Term Goal 1 (Week 1): Pt will sustain attention to functional tasks for 3-5 minute intervals with Max A cues for redirection. SLP Short Term Goal 2 (Week 1): Pt will demonstrate recall of 1 safety precaution with Max A cues for use of strategies or aids. SLP Short Term Goal 3 (Week 1): Pt will orient to date, place, and situation with Max A cues for use of external aids or other strategies. SLP Short Term Goal 4 (Week 1): Pt will identify 1 cognitive and 1 physical impairment in setting of acute hospitalization with Max A multimodal cues. SLP Short Term Goal 5 (Week 1): Pt will use strategies for speech fluency/reducing verbal repetitions with Max A multimodal cues. SLP Short Term Goal 6 (Week 1): Pt will consume dys 3 textures and thin liquids with min cues for use of swallowing precautions and minimal overt s/s of aspiration.  Skilled Therapeutic Interventions:Skilled ST services focused on swallow and cognitive skills. Pt consumed dys 3 textured snack and thin liquids via straw. Pt initial placed whole cracker in oral cavity, however was able to masticate and clear oral cavity with no overt s/s aspiration. Pt demonstrated ability to follow commands to consume small bites in the following trials. Pt demonstrated orientation of place consistently. Pt demonstrated delayed recall of year and day of the week on two separate occassions. SLP facilitated basic problem solving, sustained attention and scanning to the left visual field in card matching tasks with 3 colors and in identifying highest card among two cards. Pt demonstrated sustained attention in 5 minute intervals fading to 3 minute intervals and problem solving in matching cards to  correct color when presented with yes/no options and identifying highest card min A verbal cues. Pt demonstrated greatest difficulty with left inattention, initially anchors were effective but as the task continued pt required max A stimulation to attend to card on the left. Pt was left in room with wife present, call bell within reach and bed alarm set. SLP recommends to continue skilled services.     Pain Pain Assessment Pain Score: 0-No pain  Therapy/Group: Individual Therapy  Hajira Verhagen  [facility] 05/21/2020, 3:36 PM

## 2020-05-21 NOTE — Progress Notes (Signed)
Removed pt from tele sitter use. No behaviors noted to continue use

## 2020-05-21 NOTE — Progress Notes (Signed)
Occupational Therapy Weekly Progress Note  Patient Details  Name: [name] MRN: [id number] Date of Birth: [birth year]  Beginning of progress report period: May 14, 2020 End of progress report period: May 21, 2020  Today's Date: 05/21/2020 OT Individual Time: 0865-7846 OT Individual Time Calculation (min): 41 min    Patient has met 4 of 4 short term goals.  Pt is making slow progress toward his OT goals. He has made good improvement in his ADLs overall, completing UB dressing with mod A, LB dressing with max-total A. Pt continues to require heavy +2 assist for all transfers. At this time, unsure if family will be able to assist with this high burden of care but will continue toward established OT POC.   Patient continues to demonstrate the following deficits: muscle weakness, decreased cardiorespiratoy endurance, decreased initiation, decreased attention, decreased awareness, decreased problem solving, decreased safety awareness, decreased memory and delayed processing and decreased sitting balance, decreased standing balance and decreased postural control and therefore will continue to benefit from skilled OT intervention to enhance overall performance with BADL and Reduce care partner burden.  Patient progressing toward long term goals..  Continue plan of care.  OT Short Term Goals Week 1:  OT Short Term Goal 1 (Week 1): Pt will maintain EOB sitting balance with mod A OT Short Term Goal 1 - Progress (Week 1): Met OT Short Term Goal 2 (Week 1): Pt will set up grooming tasks with supervision to demo improved motor planning OT Short Term Goal 2 - Progress (Week 1): Met OT Short Term Goal 3 (Week 1): Pt will don shirt with mod A to demo improved sequencing OT Short Term Goal 3 - Progress (Week 1): Met OT Short Term Goal 4 (Week 1): Pt will require no more than min cueing to attend to dressing task OT Short Term Goal 4 - Progress (Week 1): Met Week 2:  OT Short Term Goal 1 (Week  2): Pt will complete bed mobility supine > EOB with mod A OT Short Term Goal 2 (Week 2): Pt will complete UB bathing with CGA seated EOB OT Short Term Goal 3 (Week 2): Pt will demonstrate emergent awareness with mod cueing OT Short Term Goal 4 (Week 2): Pt will complete UB dressing with min A  Skilled Therapeutic Interventions/Progress Updates:    Pt received sidelying with no c/o pain, agreeable to getting OOB. Pt more alert this session overall but still irritable especially in re to being cold. Pt completed rolling R and L with max A for dependent brief change. Pt completed sidelying to EOB with heavy max A. Pt donned shirt with mod A EOB, improved sequencing. Pt completed sit > stand in the stedy from very elevated EOB with heavy max +2. He struggled with sequencing and motor planning transfer and anxiety was likely contributing as well. He was transferred to the TIS w/s. Max A to scoot his hips back in the chair. Pt was left sitting up with the RN present assisting with breakfast.   Therapy Documentation Precautions:  Precautions Precautions: Fall Precaution Comments: Peripheral visual field cut L>R, seizures Restrictions Weight Bearing Restrictions: No Other Position/Activity Restrictions: on 2-4 L/min O2   Therapy/Group: Individual Therapy  Curtis Sites 05/21/2020, 6:28 AM

## 2020-05-22 LAB — GLUCOSE, CAPILLARY
Glucose-Capillary: 205 mg/dL — ABNORMAL HIGH (ref 70–99)
Glucose-Capillary: 222 mg/dL — ABNORMAL HIGH (ref 70–99)
Glucose-Capillary: 170 mg/dL — ABNORMAL HIGH (ref 70–99)
Glucose-Capillary: 174 mg/dL — ABNORMAL HIGH (ref 70–99)

## 2020-05-22 NOTE — Progress Notes (Signed)
Patient ID: [name], male   DOB: 07-20-1945, 75 y.o.   MRN: [id number]  SW received phone call from pt wife [name] who had questions related to patient discharge needs. Confirms that her son [name] will be here for family edu on Sunday 2/13 10am-12pm/1pm-3pm. When discussing the amount of support pt will continue to require she wanted to confirm if pt will d/c at a wheelchair level and if additional supports are needed. SW informed based on current updates and pt care needs with goal being Mod A at discharge, additional supports are needed. SW also informed on the limited support of the outpatient palliative referral. Pt reported that her son may call as he may have some questions. SW willing to speak with pt son to answer any questions, and she will remain main contact.   SW left message for Chrisyln King/[facility] [phone]) to follow-up about outpatient palliative referral submitted on last week and pt wife reports on having not received any follow-up. SW also informed on pt d/c date. Encouraged follow-up with SW if needed.   *SW spoke with son [name] to answer questions related to his father's discharge, and expressed the support pt mother will require at d/c. SW informed once there is a confirmation on DME, his mother will be made aware, and will need to confirm if they will be able to get a bed for pt on the ground floor. No other questions/concerns reported.   [name], MSW, [place] Office: [phone] Cell: [phone] Fax: [phone]

## 2020-05-22 NOTE — Progress Notes (Signed)
Occupational Therapy Session Note  Patient Details  Name: [name] MRN: [id number] Date of Birth: [birth year]  Today's Date: 05/22/2020 OT Individual Time: 7341-9379 OT Individual Time Calculation (min): 70 min    Short Term Goals: Week 2:  OT Short Term Goal 1 (Week 2): Pt will complete bed mobility supine > EOB with mod A OT Short Term Goal 2 (Week 2): Pt will complete UB bathing with CGA seated EOB OT Short Term Goal 3 (Week 2): Pt will demonstrate emergent awareness with mod cueing OT Short Term Goal 4 (Week 2): Pt will complete UB dressing with min A  Skilled Therapeutic Interventions/Progress Updates:    Pt received supine with no c/o pain, however throughout session he would say "ow" to just about any tactile input. When questioned if he was in pain he would respond "no its just cold". Pt rolled R and L with max A. He had reduced initiation and often responded to most commands with "I can't". Dependent brief change but pt was able to wash anterior peri area with set up assist at bed level. Pt was transferred from sidelying to EOB with max A. Once he was stabilized and with proper body mechanics he was able to maintain sitting balance with CGA, occasional slow LOB to the L and yelling "why are you pushing me" despite no hands on pt by OT. Increased time required to attempt sit > stand from EOB using the stedy with at least 5 attempts and heavy max +2. Pt often not really initiating d/t fear of falling. Pt eventually was able to stand, becoming quite agitated as he often does in the stedy as he thinks he is not properly sitting in the seat. Pt required x3 to stand in stedy as well to be transferred to his TIS w/c. He was given an extended rest break to settle down following. He reported some discomfort in his buttocks that was alleviated with TIS recline. He completed oral care at the sink with set up assist. Pt was left sitting up in the w/c with all needs, chair alarm belt on.    Therapy Documentation Precautions:  Precautions Precautions: Fall Precaution Comments: Peripheral visual field cut L>R, seizures Restrictions Weight Bearing Restrictions: No Other Position/Activity Restrictions: on 2-4 L/min O2 Therapy/Group: Individual Therapy  Curtis Sites 05/22/2020, 6:30 AM

## 2020-05-22 NOTE — Progress Notes (Signed)
[employer] [facility])  Hospital Liaison: RN note         Notified by [facility] manager that patient is planned to discharge on 2/22.            [facility] team will follow up with patient after discharge.         Please call with any hospice or palliative related questions.         Thank you for this referral.         Farrel Gordon, RN, CCM  [facility] (listed on [place] under Hospice/[facility])    [phone]

## 2020-05-22 NOTE — Progress Notes (Signed)
Physical Therapy Weekly Progress Note  Patient Details  Name: [name] MRN: [id number] Date of Birth: 03/01/1946  Beginning of progress report period: May 14, 2020 End of progress report period: May 22, 2020  Today's Date: 05/22/2020 PT Individual Time: 1310-1405 PT Individual Time Calculation (min): 55 min   Patient has partly met 1 of 3 short term goals.  Patient with slow progress this week limited by lethargy and increased agitation/confusion. Patient also limited by decreased motor planning and fear of falling. Patient presents with worsening L neglect and agitation during the week correlating with increased midline shift on MRI. Patient currently requires mod A of 1-2 for rolling with use of a hospital bed, max A of 1-2 for supine to/from sit, and mod A +2 to max A +3 for standing with RW or Stedy, variable performance due to motor planing deficits.   Patient continues to demonstrate the following deficits muscle weakness and muscle joint tightness, decreased cardiorespiratoy endurance and decreased oxygen support, impaired timing and sequencing, decreased coordination and decreased motor planning, decreased visual acuity and decreased visual perceptual skills, decreased initiation, decreased attention, decreased awareness, decreased problem solving, decreased safety awareness, decreased memory and delayed processing and decreased sitting balance, decreased standing balance, decreased postural control, hemiplegia and decreased balance strategies and therefore will continue to benefit from skilled PT intervention to increase functional independence with mobility.  Patient progressing toward long term goals..  Continue plan of care.  PT Short Term Goals Week 1:  PT Short Term Goal 1 (Week 1): Patient will perform bed mobility with mod A consistently. PT Short Term Goal 1 - Progress (Week 1): Progressing toward goal PT Short Term Goal 2 (Week 1): Patient will perform basic  transfers with max A +2 using LRAD. PT Short Term Goal 2 - Progress (Week 1): Partly met PT Short Term Goal 3 (Week 1): Patient will sit OOB >1 hour 2x per day 4/7 days this week. PT Short Term Goal 3 - Progress (Week 1): Progressing toward goal Week 2:  PT Short Term Goal 1 (Week 2): Patient will sit OOB >1 hour 2x per day 4/7 days this week. PT Short Term Goal 2 (Week 2): Patient will perform bed mobility with mod A consistently. PT Short Term Goal 3 (Week 2): Patient will perform basic transfers with max A using LRAD.  Skilled Therapeutic Interventions/Progress Updates:     Patient in bed asleep with his wife at bedside upon PT arrival. Patient alert and agreeable to PT session. Patient denied pain during session.  Patient on 2 L/min O2 throughout session, SPO2 >90%.  Therapeutic Activity: Bed Mobility: Patient performed rolling R/L with mod A of 1-2 while donning pants with total A, required hand-over-hand assist for reaching to bed rails. Provided cues for bending B knees and reaching. He performed supine to/from sit with mod-max A +2. Provided verbal cues for rolling L, bringing his legs off the bed, using the bed rail to initiate sitting up to his L elbow, then pushing through his L elbow to his hand to come to sitting. Patient sat EOB progressing from CGA with mild L lean to supervision in midline with visual target for R shoulder. Patient reports feeling "weird" when first sitting up, but states that this feeling resolved in <30 sec in sitting. Performed scooting up in bed with max A +2 then min A +2 with use of his upper extremities pulling up on the bed rails. Transfers: Patient performed sit to/from stand x3 with  mod-max A +2 from an elevated bed using a RW. Provided verbal cues for hand placement, foot placement, forward weight shift, and hip/knee/trunk extension in standing. He stood for 30-60 sec each trial before self-initiating sitting down. He performed a small step on the R on the  last trial, however, became very anxious and immediately initiated sitting back down. Patient required sitting rest breaks between trials due to deficits in activity tolerance. Played music by Merry Proud throughout to maintain patient's bright affect and for encouragement with mobility.   Offered patient a lying rest break due to increased fatigue and emotion following taking a step. Discussed getting up to the TIS w/c after resting, patient initially agreeable, however, began closing his eyes in lying and when asked again he did not recall saying he would get to the TIS w/c. Patient having difficulty maintaining his eyes open at this time.  Patient in bed reporting increased fatigue and closing his eyes with his wife in the room at end of session with breaks locked, bed alarm set, and all needs within reach. Patient missed 20 min of skilled PT due to fatigue, RN made aware. Will attempt to make-up missed time as able.     Therapy Documentation Precautions:  Precautions Precautions: Fall Precaution Comments: Peripheral visual field cut L>R, seizures Restrictions Weight Bearing Restrictions: No Other Position/Activity Restrictions: on 2-4 L/min O2 General: PT Amount of Missed Time (min): 20 Minutes PT Missed Treatment Reason: Patient fatigue  Therapy/Group: Individual Therapy  Aminah Zabawa L Rosalene Wardrop PT, DPT  05/22/2020, 8:26 PM

## 2020-05-22 NOTE — Progress Notes (Signed)
Speech Language Pathology Weekly Progress and Session Note  Patient Details  Name: [name] MRN: [id number] Date of Birth: 01-Apr-1946  Beginning of progress report period: May 15, 2020 End of progress report period: May 22, 2020  Today's Date: 05/22/2020 SLP Individual Time: 3976-7341 SLP Individual Time Calculation (min): 45 min  Short Term Goals: Week 1: SLP Short Term Goal 1 (Week 1): Pt will sustain attention to functional tasks for 3-5 minute intervals with Max A cues for redirection. SLP Short Term Goal 1 - Progress (Week 1): Met SLP Short Term Goal 2 (Week 1): Pt will demonstrate recall of 1 safety precaution with Max A cues for use of strategies or aids. SLP Short Term Goal 2 - Progress (Week 1): Met SLP Short Term Goal 3 (Week 1): Pt will orient to date, place, and situation with Max A cues for use of external aids or other strategies. SLP Short Term Goal 3 - Progress (Week 1): Met SLP Short Term Goal 4 (Week 1): Pt will identify 1 cognitive and 1 physical impairment in setting of acute hospitalization with Max A multimodal cues. SLP Short Term Goal 4 - Progress (Week 1): Met SLP Short Term Goal 5 (Week 1): Pt will use strategies for speech fluency/reducing verbal repetitions with Max A multimodal cues. SLP Short Term Goal 5 - Progress (Week 1): Met SLP Short Term Goal 6 (Week 1): Pt will consume dys 3 textures and thin liquids with min cues for use of swallowing precautions and minimal overt s/s of aspiration. SLP Short Term Goal 6 - Progress (Week 1): Not met    New Short Term Goals: Week 2: SLP Short Term Goal 1 (Week 2): Pt will sustain attention to functional tasks for 10 minute intervals with Max A cues for redirection. SLP Short Term Goal 2 (Week 2): Pt will orient to date, place, and situation with Mod A cues for use of external aids or other strategies. SLP Short Term Goal 3 (Week 2): Pt will identify 2 cognitive and 2 physical impairment in setting of  acute hospitalization with Mod A multimodal cues. SLP Short Term Goal 4 (Week 2): Pt will use strategies for speech fluency/reducing verbal repetitions with Mod A multimodal cues. SLP Short Term Goal 5 (Week 2): Pt will consume dys 3 textures and thin liquids with min cues for use of swallowing precautions and minimal overt s/s of aspiration. SLP Short Term Goal 6 (Week 2): Patient will demonstrate functional problem solving for basic and familiar tasks with Mod A multimodal cues.  Weekly Progress Updates: Patient has made functional gains and has met 5 of 6 STGs this reporting period. Currently, patient is consuming Dys. 3 textures with thin liquids with minimal overt s/s of aspiration but requires Mod-Max A verbal cues for use of swallowing compensatory strategies like small bites/sips. Recommend patient continue current diet. Patient also requires overall Max A verbal cues for sustained attention, intellectual awareness, orientation, functional problem solving and recall. Patient demonstrates improved speech fluency with only mild word repetitions noted. Patient and family education ongoing. Patient would benefit from continued skilled SLP intervention to maximize his cognitive and swallowing function prior to discharge.      Intensity: Minumum of 1-2 x/day, 30 to 90 minutes Frequency: 3 to 5 out of 7 days Duration/Length of Stay: 06/11/20 Treatment/Interventions: Cognitive remediation/compensation;Cueing hierarchy;Internal/external aids;Speech/Language facilitation;Therapeutic Activities;Patient/family education;Functional tasks   Daily Session  Skilled Therapeutic Interventions: Skilled treatment session focused on dysphagia and cognitive goals. Upon arrival, patient was asleep  and required extra time and encouragement for arousal. SLP facilitated session by repositioning patient to maximize arousal and attention for safe PO intake. Patient self-feed his breakfast meal of Dys. 3 textures with  thin liquids with overt cough X 1 after sip of thin liquids, suspect due to poor positioning in thed bed due to pain despite multiple attempts to readjust. Patient also had a mild amount of oral residue that cleared with a liquid wash, recommend patient continue current diet. SLP also facilitated session by providing Mod A verbal cues for sustained attention to task and for orientation to date with use of external aids.  Patient left upright in bed with alarm on and all needs within reach. Continue with current plan of care.     Pain Pain near sacrum, patient repositioned and RN administered medications   Therapy/Group: Individual Therapy  Ty Oshima 05/22/2020, 6:41 AM

## 2020-05-22 NOTE — Progress Notes (Signed)
[facility] PHYSICAL MEDICINE & REHABILITATION PROGRESS NOTE   Subjective/Complaints:  No major issues. Slept ok last night.   ROS: Patient denies fever, rash, sore throat, blurred vision, nausea, vomiting, diarrhea, cough, shortness of breath or chest pain, joint or back pain, headache, or mood change.    Objective:   No results found. Recent Labs    05/20/20 0630  WBC 3.5*  HGB 10.8*  HCT 32.2*  PLT 144*   Recent Labs    05/20/20 0630  NA 138  K 4.2  CL 101  CO2 29  GLUCOSE 210*  BUN 12  CREATININE 0.54*  CALCIUM 8.6*    Intake/Output Summary (Last 24 hours) at 05/22/2020 7035 Last data filed at 05/21/2020 1328 Gross per 24 hour  Intake 480 ml  Output --  Net 480 ml     Pressure Injury 05/14/20 Sacrum Mid Unstageable - Full thickness tissue loss in which the base of the injury is covered by slough (yellow, tan, gray, green or brown) and/or eschar (tan, brown or black) in the wound bed. previously documented as stage 2,  (Active)  05/14/20 0132  Location: Sacrum  Location Orientation: Mid  Staging: Unstageable - Full thickness tissue loss in which the base of the injury is covered by slough (yellow, tan, gray, green or brown) and/or eschar (tan, brown or black) in the wound bed.  Wound Description (Comments): previously documented as stage 2, now unsteagable with with soft black tissue covering wound base  Present on Admission: Yes    Physical Exam: Vital Signs Blood pressure (!) 142/82, pulse 83, temperature 98.3 F (36.8 C), temperature source Oral, resp. rate 16, height 5\' 11"  (1.803 m), weight 96 kg, SpO2 97 %.  Constitutional: No distress . Vital signs reviewed. HEENT: EOMI, oral membranes moist Neck: supple Cardiovascular: RRR without murmur. No JVD    Respiratory/Chest: CTA Bilaterally without wheezes or rales. Normal effort    GI/Abdomen: BS +, non-tender, non-distended Ext: no clubbing, cyanosis, or edema Psych: pleasant and cooperative Skin:  sacral wound not visualized today, heels remain boggy/chronic changes R>L  Neuro:  Alert.  Oriented to person, place, reason. Follows basic commands. Delayed processing still, awareness/insight still impaired.  Cranial nerves 2-12 are intact. Sensory exam is intact to PP. Reflexes are 1+ in all 4's. Mild left inattention. Motor 4/5 RUE and  4/5 LUE, BLE grossly 2/5 prox to 3+/5  Musculoskeletal: Full ROM, No pain with AROM or PROM in the neck, trunk, or extremities. Posture appropriate     Assessment/Plan: 1. Functional deficits which require 3+ hours per day of interdisciplinary therapy in a comprehensive inpatient rehab setting.  Physiatrist is providing close team supervision and 24 hour management of active medical problems listed below.  Physiatrist and rehab team continue to assess barriers to discharge/monitor patient progress toward functional and medical goals  Care Tool:  Bathing    Body parts bathed by patient: Right arm,Left arm,Chest,Abdomen,Face   Body parts bathed by helper: Front perineal area,Buttocks,Right upper leg,Left upper leg,Right lower leg,Left lower leg     Bathing assist Assist Level: Maximal Assistance - Patient 24 - 49%     Upper Body Dressing/Undressing Upper body dressing   What is the patient wearing?: Pull over shirt    Upper body assist Assist Level: Maximal Assistance - Patient 25 - 49%    Lower Body Dressing/Undressing Lower body dressing      What is the patient wearing?: Incontinence brief     Lower body assist Assist for  lower body dressing: Dependent - Patient 0%     Toileting Toileting Toileting Activity did not occur Landscape architect and hygiene only): N/A (no void or bm)  Toileting assist Assist for toileting: 2 Helpers     Transfers Chair/bed transfer  Transfers assist  Chair/bed transfer activity did not occur: Safety/medical concerns (decreased strength/activity tolerance)  Chair/bed transfer assist level:  Dependent - mechanical lift     Locomotion Ambulation   Ambulation assist   Ambulation activity did not occur: Safety/medical concerns (decreased strength/activity tolerance)          Walk 10 feet activity   Assist  Walk 10 feet activity did not occur: Safety/medical concerns        Walk 50 feet activity   Assist Walk 50 feet with 2 turns activity did not occur: Safety/medical concerns         Walk 150 feet activity   Assist Walk 150 feet activity did not occur: Safety/medical concerns         Walk 10 feet on uneven surface  activity   Assist Walk 10 feet on uneven surfaces activity did not occur: Safety/medical concerns         Wheelchair     Assist Will patient use wheelchair at discharge?: Yes (Per PT long term goals)   Wheelchair activity did not occur: Safety/medical concerns (decreased strength/activity tolerance to transfer to w/c on eval)         Wheelchair 50 feet with 2 turns activity    Assist    Wheelchair 50 feet with 2 turns activity did not occur: Safety/medical concerns       Wheelchair 150 feet activity     Assist  Wheelchair 150 feet activity did not occur: Safety/medical concerns       Blood pressure (!) 142/82, pulse 83, temperature 98.3 F (36.8 C), temperature source Oral, resp. rate 16, height 5\' 11"  (1.803 m), weight 96 kg, SpO2 97 %.  Medical Problem List and Plan: 1.Functional and mobility deficitssecondary to debility after recurrent seizures and associated respiratory failure. Resolving encephalopathy.  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 ?SNF 2. Antithrombotics: -DVT/anticoagulation:Pharmaceutical:Heparin. Following  platelets -antiplatelet therapy: N/a 3. Pain Management:Tylenol prn 4. Mood/agitation: -antipsychotic agents: continue low dose seroquel to help with sundowning  -limit benzos  -continue pyridoxine 5. Neuropsych/AMS: This patientis not fullycapable of making decisions onhisown behalf.  1/27-28  increased lethargy and left sided weakness   1/31-2/2: EColi--pt now on amoxil to complete course of therapy (10d)    -looking better    -spoke with [name] on Friday and he recommended increasing decadron to 4mg  bid and will see him in the office given changes on Thursday's HCT 6. Skin/Wound Care:Routine pressure relief measures. -prevalon boots for bilateral heels -encourage adequate PO intake 7. Fluids/Electrolytes/Nutrition:encourage PO  -low protein state   -offering protein supps   -2/1 continue push fluids, will dc IV 8. HTN: Monitor BP tid--continue Norvasc and metoprolol  -fair control 9. Steroid induced hyperglycemia: Monitor BS ac/hs and use SSI for elevated BS.  -if plan is to continue decadron, he'll need to be on something scheduled to better control CBG's   -began amaryl 1mg  daily 1/25--with improvement 10. GBM/Seizures: Has been seizure free on Keppra 2000 mg bid.  -  continue decadron 4mg  bid as above  -see #5  -[name] can engage hospice as appropriate as outpt 12. Hypoxia: Currently on 2 L per [place]    ---continue oxygen per [place].wean as possible May need to go home on O2 -most recent CXR with atelectasis and low lung volumes - encourage IS, deep breathing, OOB          LOS: 9 days A FACE TO FACE EVALUATION WAS PERFORMED  Meredith Staggers 05/22/2020, 8:39 AM

## 2020-05-23 ENCOUNTER — Inpatient Hospital Stay (HOSPITAL_COMMUNITY): Payer: Medicare Other

## 2020-05-23 LAB — GLUCOSE, CAPILLARY
Glucose-Capillary: 209 mg/dL — ABNORMAL HIGH (ref 70–99)
Glucose-Capillary: 238 mg/dL — ABNORMAL HIGH (ref 70–99)
Glucose-Capillary: 200 mg/dL — ABNORMAL HIGH (ref 70–99)
Glucose-Capillary: 136 mg/dL — ABNORMAL HIGH (ref 70–99)

## 2020-05-23 MED ORDER — GLIMEPIRIDE 2 MG PO TABS
2.0000 mg | ORAL_TABLET | Freq: Every day | ORAL | Status: DC
  Administered 2020-05-24 – 2020-05-27 (×4): 2 mg via ORAL
  Filled 2020-05-23 (×4): qty 1

## 2020-05-23 NOTE — Progress Notes (Addendum)
Physical Therapy Session Note  Patient Details  Name: [name] MRN: [id number] Date of Birth: 1945/09/29  Today's Date: 05/23/2020 PT Individual Time: 0930-1020 PT Individual Time Calculation (min): 50 min   Short Term Goals: Week 2:  PT Short Term Goal 1 (Week 2): Patient will sit OOB >1 hour 2x per day 4/7 days this week. PT Short Term Goal 2 (Week 2): Patient will perform bed mobility with mod A consistently. PT Short Term Goal 3 (Week 2): Patient will perform basic transfers with max A using LRAD.  Skilled Therapeutic Interventions/Progress Updates:     Patient in bed with nasal cannula off upon PT arrival. Patient alert and agreeable to PT session. Patient reported constant sacral pain/discomfort during session, RN made aware. PT provided repositioning, rest breaks, and distraction as pain interventions throughout session.   Place nasal cannula back on the patient, SPO2 82% on RA, unable to recover >84% on 2L/min in >1 min, unable to recover >86% on 3L/min after another minute, recovered to 89-90% on 4L/min after a total of 4 min. RN made aware. SPO2 remained between 88-94% with bed mobility on 4 L/min O2, RN and PA made aware.   Therapeutic Activity: Bed Mobility: Patient performed rolling R/L with mod A +2 and hand over-hand assist for reaching and cues for bending up his legs and initiating rolling. Performed peri-care and doffed/donned incontinence brief during rolling with total A. Sacral dressing coming off and soiled during peri-care, removed dressing, RN aware. patient performed scooting up in bed x2 with max A, using B lower extremities to push up and lift hips to reduce sheering forces during mobility. Sat patient up in the bed and performed diaphragmatic breathing >5 min with O2 sats maintaining 92-95%. Educated patient on benefits of breathing technique and upright sitting for improved breath support. Reinforced that the patient needs to keep his nasal cannula on at all  times due to decreased oxygen saturation. Patient tearful during discussion staing, "I don't want to leave my wife." Comforted patient and provided emotional support. RN arrived to change dressing and discussed O2 sats with bed mobility and determined to not have patient sit EOB at this time until PA has a chance to see the patient. Patient missed 25 min of skilled PT due to oxygen desaturation, RN made aware. Will attempt to make-up missed time as able.    Patient in bed with RN and NT in the room at end of session with breaks locked and all needs within reach.    Therapy Documentation Precautions:  Precautions Precautions: Fall Precaution Comments: Peripheral visual field cut L>R, seizures Restrictions Weight Bearing Restrictions: No Other Position/Activity Restrictions: on 2-4 L/min O2 General: PT Amount of Missed Time (min): 25 Minutes PT Missed Treatment Reason: Patient fatigue;Nursing care (SPO2 dropping <90% on 4L with wet cough)   Therapy/Group: Individual Therapy  Wanita Derenzo L Ziv Welchel PT, DPT  05/23/2020, 12:53 PM

## 2020-05-23 NOTE — Progress Notes (Signed)
Speech Language Pathology Daily Session Note  Patient Details  Name: [name] MRN: [id number] Date of Birth: Feb 23, 1946  Today's Date: 05/23/2020 SLP Individual Time: 0730-0810 SLP Individual Time Calculation (min): 40 min  Short Term Goals: Week 2: SLP Short Term Goal 1 (Week 2): Pt will sustain attention to functional tasks for 10 minute intervals with Max A cues for redirection. SLP Short Term Goal 2 (Week 2): Pt will orient to date, place, and situation with Mod A cues for use of external aids or other strategies. SLP Short Term Goal 3 (Week 2): Pt will identify 2 cognitive and 2 physical impairment in setting of acute hospitalization with Mod A multimodal cues. SLP Short Term Goal 4 (Week 2): Pt will use strategies for speech fluency/reducing verbal repetitions with Mod A multimodal cues. SLP Short Term Goal 5 (Week 2): Pt will consume dys 3 textures and thin liquids with min cues for use of swallowing precautions and minimal overt s/s of aspiration. SLP Short Term Goal 6 (Week 2): Patient will demonstrate functional problem solving for basic and familiar tasks with Mod A multimodal cues.  Skilled Therapeutic Interventions: Skilled treatment session focused on cognitive goals. Upon arrival, patient was lethargic and demonstrated difficulty maintaining efficient arousal. Attempted to reposition patient for PO intake, however, patient extremely restless and constantly repositioning himself due to discomfort in buttocks. Patient consistently taking off his oxygen and yelling "help me" despite clinician being present with difficulty expressing wants/needs. Patient eventually found a position that he was comfortable in that was not safe for PO intake with patient declining to sit up. Therefore,breakfast was deferred. Patient required total A for orientation to date but was independently oriented to place (hospital) and situation (seizures). Patient left semi-reclined in bed with alarm on and  all needs within reach. Continue with current plan of care.       Pain Unable to rate pain in buttocks, RN aware and patient repositioned   Therapy/Group: Individual Therapy  Blondina Coderre 05/23/2020, 12:16 PM

## 2020-05-23 NOTE — Progress Notes (Signed)
Nurse expressed concerns about congested sounds. Patient was sleeping soundly but aroused and belligerent initially-->wanted to get out of bed to void (but unable to express need). Voice wet and lungs with decreased BS due to poor effort.  Upper airway sound due to oral secretions. Will monitor.

## 2020-05-23 NOTE — Progress Notes (Signed)
Occupational Therapy Session Note  Patient Details  Name: Fran C Munyon MRN: [id number] Date of Birth: 05/02/1945  Today's Date: 05/23/2020 OT Individual Time: 1420-1450 OT Individual Time Calculation (min): 30 min    Short Term Goals: Week 1:  OT Short Term Goal 1 (Week 1): Pt will maintain EOB sitting balance with mod A OT Short Term Goal 1 - Progress (Week 1): Met OT Short Term Goal 2 (Week 1): Pt will set up grooming tasks with supervision to demo improved motor planning OT Short Term Goal 2 - Progress (Week 1): Met OT Short Term Goal 3 (Week 1): Pt will don shirt with mod A to demo improved sequencing OT Short Term Goal 3 - Progress (Week 1): Met OT Short Term Goal 4 (Week 1): Pt will require no more than min cueing to attend to dressing task OT Short Term Goal 4 - Progress (Week 1): Met  Skilled Therapeutic Interventions/Progress Updates:    1:1. Pt received in bed diagonal and "uncomfortable." Pt requires coaxing to sit at EOB. Pt requries +2 A to assume seated EOB with pt pushing towards L hemi side. Pt requires max facilitation to WB into R elbow for midline orientation. O2 sats >95% throughout session. With standard chair in front of patient, pt completes 3 sit to stands ~10 sec per trial with improved terminal hip extension and trunk extension as trials go on. Pt continues to requrie elevated bed height as well as MAX A +2. Exited session with pt seated in bed, exit alarm on and call light in reach pt missed 15 min skilled OT d/t fatigue.   Therapy Documentation Precautions:  Precautions Precautions: Fall Precaution Comments: Peripheral visual field cut L>R, seizures Restrictions Weight Bearing Restrictions: No Other Position/Activity Restrictions: on 2-4 L/min O2 General:   Vital Signs:  Pain:   ADL: ADL Eating: Minimal assistance Where Assessed-Eating: Bed level Grooming: Minimal assistance,Moderate cueing Where Assessed-Grooming: Edge of bed Upper Body  Bathing: Minimal assistance,Moderate cueing Where Assessed-Upper Body Bathing: Edge of bed Lower Body Bathing: Dependent,Moderate cueing Where Assessed-Lower Body Bathing: Bed level Upper Body Dressing: Maximal assistance,Moderate cueing Where Assessed-Upper Body Dressing: Edge of bed Lower Body Dressing: Dependent Where Assessed-Lower Body Dressing: Bed level Toileting: Unable to assess Toilet Transfer Method: Unable to assess Vision   Perception    Praxis   Exercises:   Other Treatments:     Therapy/Group: Individual Therapy   M  05/23/2020, 12:18 PM 

## 2020-05-23 NOTE — Progress Notes (Signed)
Occupational Therapy Session Note  Patient Details  Name: [name] MRN: [id number] Date of Birth: [birth year]  Today's Date: 05/23/2020 OT Individual Time: 0700-0725 OT Individual Time Calculation (min): 25 min    Short Term Goals: Week 1:  OT Short Term Goal 1 (Week 1): Pt will maintain EOB sitting balance with mod A OT Short Term Goal 1 - Progress (Week 1): Met OT Short Term Goal 2 (Week 1): Pt will set up grooming tasks with supervision to demo improved motor planning OT Short Term Goal 2 - Progress (Week 1): Met OT Short Term Goal 3 (Week 1): Pt will don shirt with mod A to demo improved sequencing OT Short Term Goal 3 - Progress (Week 1): Met OT Short Term Goal 4 (Week 1): Pt will require no more than min cueing to attend to dressing task OT Short Term Goal 4 - Progress (Week 1): Met  Skilled Therapeutic Interventions/Progress Updates:    1:1. Pt received in bed asleep. Pt with decreased orientation consistently calling out "for my brother to wake him up." Pt believes he is in [place]. Gentle reorientation to place, time and situation provided. Pt completes face washing with wash cloth handed to pt on L for L attention. Pt getting restless after stating, "alright leave me alone." break provided and pt pulls self lopsided in bed and begins screaming "help me im bleeding." Pt requires soothing and reassurance prior to repositioning in center of bed. Pt provided with oral care items with toothpaste already loaded onto toothbrush and requires VC for sequencing oral care with S. Exited session with pt seated in bed, exit alarm on and call light in reach   Therapy Documentation Precautions:  Precautions Precautions: Fall Precaution Comments: Peripheral visual field cut L>R, seizures Restrictions Weight Bearing Restrictions: No Other Position/Activity Restrictions: on 2-4 L/min O2 General:   Vital Signs: Therapy Vitals Temp: 98.6 F (37 C) Pulse Rate: 87 Resp: 17 BP: (!)  145/76 Patient Position (if appropriate): Lying Oxygen Therapy SpO2: 92 % Pain: Pain Assessment Pain Score: Asleep ADL: ADL Eating: Minimal assistance Where Assessed-Eating: Bed level Grooming: Minimal assistance,Moderate cueing Where Assessed-Grooming: Edge of bed Upper Body Bathing: Minimal assistance,Moderate cueing Where Assessed-Upper Body Bathing: Edge of bed Lower Body Bathing: Dependent,Moderate cueing Where Assessed-Lower Body Bathing: Bed level Upper Body Dressing: Maximal assistance,Moderate cueing Where Assessed-Upper Body Dressing: Edge of bed Lower Body Dressing: Dependent Where Assessed-Lower Body Dressing: Bed level Toileting: Unable to assess Toilet Transfer Method: Unable to assess Vision   Perception    Praxis   Exercises:   Other Treatments:     Therapy/Group: Individual Therapy  Tonny Branch 05/23/2020, 6:41 AM

## 2020-05-23 NOTE — Progress Notes (Signed)
Patient ID: [name], male   DOB: 1946/03/31, 75 y.o.   MRN: [id number]  SW received updates from pt wife that family edu with their son [name] will now be on 2/20 10am-3pm due to scheduling conflict.   [name], MSW, [place] Office: [phone] Cell: [phone] Fax: [phone]

## 2020-05-23 NOTE — Progress Notes (Signed)
[facility] PHYSICAL MEDICINE & REHABILITATION PROGRESS NOTE   Subjective/Complaints:  Had a reasonable night. Says he's feeling well this morning. No new complaints  ROS: Limited due to cognitive/behavioral   Objective:   No results found. No results for input(s): WBC, HGB, HCT, PLT in the last 72 hours. No results for input(s): NA, K, CL, CO2, GLUCOSE, BUN, CREATININE, CALCIUM in the last 72 hours.  Intake/Output Summary (Last 24 hours) at 05/23/2020 0948 Last data filed at 05/23/2020 7867 Gross per 24 hour  Intake 560 ml  Output --  Net 560 ml     Pressure Injury 05/14/20 Sacrum Mid Unstageable - Full thickness tissue loss in which the base of the injury is covered by slough (yellow, tan, gray, green or brown) and/or eschar (tan, brown or black) in the wound bed. previously documented as stage 2,  (Active)  05/14/20 0132  Location: Sacrum  Location Orientation: Mid  Staging: Unstageable - Full thickness tissue loss in which the base of the injury is covered by slough (yellow, tan, gray, green or brown) and/or eschar (tan, brown or black) in the wound bed.  Wound Description (Comments): previously documented as stage 2, now unsteagable with with soft black tissue covering wound base  Present on Admission: Yes    Physical Exam: Vital Signs Blood pressure (!) 145/76, pulse 87, temperature 98.6 F (37 C), resp. rate 17, height 5\' 11"  (1.803 m), weight 96 kg, SpO2 92 %.  Constitutional: No distress . Vital signs reviewed. HEENT: EOMI, oral membranes moist Neck: supple Cardiovascular: RRR without murmur. No JVD    Respiratory/Chest: CTA Bilaterally without wheezes or rales. Normal effort    GI/Abdomen: BS +, non-tender, non-distended Ext: no clubbing, cyanosis, or edema Psych: pleasant and cooperative Skin: sacral wound essentially unstageable, heels remain boggy/chronic changes R>L  Neuro:  Very alert. Oriented to person, place, hospital. Follows basic commands. Delayed  processing still, awareness/insight still impaired.  Cranial nerves 2-12 are intact. Sensory exam is intact to PP. Reflexes are 1+ in all 4's. Mild left inattention. Motor 4/5 RUE and  4/5 LUE, BLE grossly 2/5 prox to 3+/5 --stable exam Musculoskeletal: Full ROM, No pain with AROM or PROM in the neck, trunk, or extremities. Posture appropriate     Assessment/Plan: 1. Functional deficits which require 3+ hours per day of interdisciplinary therapy in a comprehensive inpatient rehab setting.  Physiatrist is providing close team supervision and 24 hour management of active medical problems listed below.  Physiatrist and rehab team continue to assess barriers to discharge/monitor patient progress toward functional and medical goals  Care Tool:  Bathing    Body parts bathed by patient: Right arm,Left arm,Chest,Abdomen,Face   Body parts bathed by helper: Front perineal area,Buttocks,Right upper leg,Left upper leg,Right lower leg,Left lower leg     Bathing assist Assist Level: Maximal Assistance - Patient 24 - 49%     Upper Body Dressing/Undressing Upper body dressing   What is the patient wearing?: Pull over shirt    Upper body assist Assist Level: Maximal Assistance - Patient 25 - 49%    Lower Body Dressing/Undressing Lower body dressing      What is the patient wearing?: Incontinence brief     Lower body assist Assist for lower body dressing: Dependent - Patient 0%     Toileting Toileting Toileting Activity did not occur (Clothing management and hygiene only): N/A (no void or bm)  Toileting assist Assist for toileting: 2 Helpers     Transfers Chair/bed transfer  Transfers  assist  Chair/bed transfer activity did not occur: Safety/medical concerns (decreased strength/activity tolerance)  Chair/bed transfer assist level: Dependent - mechanical lift     Locomotion Ambulation   Ambulation assist   Ambulation activity did not occur: Safety/medical concerns (decreased  strength/activity tolerance)          Walk 10 feet activity   Assist  Walk 10 feet activity did not occur: Safety/medical concerns        Walk 50 feet activity   Assist Walk 50 feet with 2 turns activity did not occur: Safety/medical concerns         Walk 150 feet activity   Assist Walk 150 feet activity did not occur: Safety/medical concerns         Walk 10 feet on uneven surface  activity   Assist Walk 10 feet on uneven surfaces activity did not occur: Safety/medical concerns         Wheelchair     Assist Will patient use wheelchair at discharge?: Yes (Per PT long term goals)   Wheelchair activity did not occur: Safety/medical concerns (decreased strength/activity tolerance to transfer to w/c on eval)         Wheelchair 50 feet with 2 turns activity    Assist    Wheelchair 50 feet with 2 turns activity did not occur: Safety/medical concerns       Wheelchair 150 feet activity     Assist  Wheelchair 150 feet activity did not occur: Safety/medical concerns       Blood pressure (!) 145/76, pulse 87, temperature 98.6 F (37 C), resp. rate 17, height 5\' 11"  (1.803 m), weight 96 kg, SpO2 92 %.  Medical Problem List and Plan: 1.Functional and mobility deficitssecondary to debility after recurrent seizures and associated respiratory failure. Resolving encephalopathy.  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 Antithrombotics: -DVT/anticoagulation:Pharmaceutical:Heparin. Following platelets -antiplatelet therapy: N/a 3. Pain Management:Tylenol prn 4. Mood/agitation: -antipsychotic agents: continue low dose seroquel to help with sundowning  -limit benzos  -continue pyridoxine 5. Neuropsych/AMS: This patientis not  fullycapable of making decisions onhisown behalf.  1/27-28  increased lethargy and left sided weakness  1/31-2/3: EColi UTI--pt now on amoxil to complete course of therapy (10d)    -pt much more alert    -spoke with [name] on 1/28 and he recommended increasing decadron to 4mg  bid and will see him in the office given changes on the most recent HCT 6. Skin/Wound Care:Routine pressure relief measures. -prevalon boots for bilateral heels -encourage adequate PO intake 7. Fluids/Electrolytes/Nutrition:encourage PO  -low protein state   -offering protein supps   -2/3 pushing fluids   -recheck BMET 2/7--eating well at present 8. HTN: Monitor BP tid--continue Norvasc and metoprolol  -fair control 9. Steroid induced hyperglycemia: Monitor BS ac/hs and use SSI for elevated BS.  -if plan is to continue decadron, he'll need to be on something scheduled to better control CBG's   -began amaryl 1mg  daily 1/25   -2/3 cbg's increased since po intake has picked up. Increase amaryl to 2mg  daily 10. GBM/Seizures: Has been seizure free on Keppra 2000 mg bid.  -continue decadron 4mg  bid as above  -see #5  -[name] can engage hospice as appropriate as outpt 12. Hypoxia: Currently on 2 L per [place]    ---continue oxygen per [place].wean as possible May need to go  home on O2 -most recent CXR with atelectasis and low lung volumes - encourage IS, deep breathing, OOB          LOS: 10 days A FACE TO FACE EVALUATION WAS PERFORMED  Meredith Staggers 05/23/2020, 9:48 AM

## 2020-05-24 DIAGNOSIS — I10 Essential (primary) hypertension: Secondary | ICD-10-CM

## 2020-05-24 DIAGNOSIS — N390 Urinary tract infection, site not specified: Secondary | ICD-10-CM

## 2020-05-24 DIAGNOSIS — R1310 Dysphagia, unspecified: Secondary | ICD-10-CM

## 2020-05-24 DIAGNOSIS — Z9981 Dependence on supplemental oxygen: Secondary | ICD-10-CM

## 2020-05-24 DIAGNOSIS — T380X5A Adverse effect of glucocorticoids and synthetic analogues, initial encounter: Secondary | ICD-10-CM

## 2020-05-24 LAB — GLUCOSE, CAPILLARY
Glucose-Capillary: 186 mg/dL — ABNORMAL HIGH (ref 70–99)
Glucose-Capillary: 186 mg/dL — ABNORMAL HIGH (ref 70–99)
Glucose-Capillary: 183 mg/dL — ABNORMAL HIGH (ref 70–99)
Glucose-Capillary: 229 mg/dL — ABNORMAL HIGH (ref 70–99)

## 2020-05-24 MED ORDER — ACETAMINOPHEN 325 MG PO TABS: 325.0000 mg | ORAL_TABLET | ORAL | Status: AC | PRN

## 2020-05-24 NOTE — Progress Notes (Signed)
[place] PHYSICAL MEDICINE & REHABILITATION PROGRESS NOTE   Subjective/Complaints: Patient seen laying in bed this morning working with therapies.  He states he did not sleep well overnight due to his bottom hurting.  No reported issues overnight.  ROS: Limited due to cognition  Objective:   DG Chest 2 View  Result Date: 05/23/2020 CLINICAL DATA:  Congestion. EXAM: CHEST - 2 VIEW COMPARISON:  May 16, 2020. FINDINGS: Stable cardiomediastinal silhouette. Status post coronary bypass graft. No pneumothorax is noted. Mild left basilar atelectasis or infiltrate is noted with small left pleural effusion. Right lung is unremarkable. Bony thorax is unremarkable. IMPRESSION: Mild left basilar atelectasis or infiltrate is noted with small left pleural effusion. Electronically Signed   By: Marijo Conception M.D.   On: 05/23/2020 13:29   No results for input(s): WBC, HGB, HCT, PLT in the last 72 hours. No results for input(s): NA, K, CL, CO2, GLUCOSE, BUN, CREATININE, CALCIUM in the last 72 hours.  Intake/Output Summary (Last 24 hours) at 05/24/2020 1204 Last data filed at 05/24/2020 5462 Gross per 24 hour  Intake 480 ml  Output --  Net 480 ml     Pressure Injury 05/14/20 Sacrum Mid Unstageable - Full thickness tissue loss in which the base of the injury is covered by slough (yellow, tan, gray, green or brown) and/or eschar (tan, brown or black) in the wound bed. previously documented as stage 2,  (Active)  05/14/20 0132  Location: Sacrum  Location Orientation: Mid  Staging: Unstageable - Full thickness tissue loss in which the base of the injury is covered by slough (yellow, tan, gray, green or brown) and/or eschar (tan, brown or black) in the wound bed.  Wound Description (Comments): previously documented as stage 2, now unsteagable with with soft black tissue covering wound base  Present on Admission: Yes    Physical Exam: Vital Signs Blood pressure (!) 148/79, pulse 75, temperature 98.4 F  (36.9 C), resp. rate 14, height 5\' 11"  (1.803 m), weight 96 kg, SpO2 98 %. Constitutional: No distress . Vital signs reviewed. HENT: Normocephalic.  Atraumatic. Eyes: EOMI. No discharge. Cardiovascular: No JVD.  RRR. Respiratory: Normal effort.  No stridor.  Bilateral clear to auscultation. GI: Non-distended.  BS +. Skin: Warm and dry.   Unstageable ulcer to sacrum Psych: Normal mood.  Normal behavior. Musc: No edema in extremities.  No tenderness in extremities. Neuro: Alert and oriented x1 Motor: RUE/RLE: 4/5 proximal distal Left neglect LUE/LLE: ?3/5 proximal to distal  Assessment/Plan: 1. Functional deficits which require 3+ hours per day of interdisciplinary therapy in a comprehensive inpatient rehab setting.  Physiatrist is providing close team supervision and 24 hour management of active medical problems listed below.  Physiatrist and rehab team continue to assess barriers to discharge/monitor patient progress toward functional and medical goals  Care Tool:  Bathing    Body parts bathed by patient: Right arm,Left arm,Chest,Abdomen,Face   Body parts bathed by helper: Front perineal area,Buttocks,Right upper leg,Left upper leg,Right lower leg,Left lower leg     Bathing assist Assist Level: Maximal Assistance - Patient 24 - 49%     Upper Body Dressing/Undressing Upper body dressing   What is the patient wearing?: Pull over shirt    Upper body assist Assist Level: Maximal Assistance - Patient 25 - 49%    Lower Body Dressing/Undressing Lower body dressing      What is the patient wearing?: Incontinence brief     Lower body assist Assist for lower body dressing: Dependent -  Patient 0%     Toileting Toileting Toileting Activity did not occur Landscape architect and hygiene only): N/A (no void or bm)  Toileting assist Assist for toileting: 2 Helpers     Transfers Chair/bed transfer  Transfers assist  Chair/bed transfer activity did not occur:  Safety/medical concerns (decreased strength/activity tolerance)  Chair/bed transfer assist level: Dependent - mechanical lift     Locomotion Ambulation   Ambulation assist   Ambulation activity did not occur: Safety/medical concerns (decreased strength/activity tolerance)          Walk 10 feet activity   Assist  Walk 10 feet activity did not occur: Safety/medical concerns        Walk 50 feet activity   Assist Walk 50 feet with 2 turns activity did not occur: Safety/medical concerns         Walk 150 feet activity   Assist Walk 150 feet activity did not occur: Safety/medical concerns         Walk 10 feet on uneven surface  activity   Assist Walk 10 feet on uneven surfaces activity did not occur: Safety/medical concerns         Wheelchair     Assist Will patient use wheelchair at discharge?: Yes (Per PT long term goals)   Wheelchair activity did not occur: Safety/medical concerns (decreased strength/activity tolerance to transfer to w/c on eval)         Wheelchair 50 feet with 2 turns activity    Assist    Wheelchair 50 feet with 2 turns activity did not occur: Safety/medical concerns       Wheelchair 150 feet activity     Assist  Wheelchair 150 feet activity did not occur: Safety/medical concerns       Blood pressure (!) 148/79, pulse 75, temperature 98.4 F (36.9 C), resp. rate 14, height 5\' 11"  (1.803 m), weight 96 kg, SpO2 98 %.  Medical Problem List and Plan: 1.Functional and mobility deficitssecondary to debility after recurrent seizures and associated respiratory failure. Resolving encephalopathy.  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 Antithrombotics: -DVT/anticoagulation:Pharmaceutical:Heparin.  -antiplatelet therapy: N/a 3. Pain Management:Tylenol prn 4. Mood/agitation: -antipsychotic agents:  Continue low dose seroquel to help with sundowning   -limit benzos  -continue pyridoxine 5. Neuropsych: This patientis not fullycapable of making decisions onhisown behalf. 6. Skin/Wound Care:Routine pressure relief measures. -prevalon boots for bilateral heels -encourage adequate PO intake 7. Fluids/Electrolytes/Nutrition:encourage PO  -low protein state   -offering protein supps  8. HTN: Monitor BP tid--continue Norvasc and metoprolol  Mildly elevated on 2/12, will consider medication adjustments if persistently elevated 9. Steroid induced hyperglycemia: Monitor BS ac/hs and use SSI for elevated BS.  amaryl 1mg  daily 1/25, increased to 2 mg on 2/3  Remains elevated on 2/4 10. GBM/Seizures: Has been seizure free on Keppra 2000 mg bid.  -continue decadron 4mg  bid as above  -see #5  No breakthrough seizures since admission to rehab 12. Hypoxia: Currently on 2 L per [place]    ---continue supplemental oxygen per [place].wean as possible May need to go home on O2 - encourage IS, deep breathing, OOB 13.  Acute lower UTI  Continue amoxicillin x10 days total 14.  Dysphagia  D3 thins, advance diet as tolerated    LOS: 11 days A FACE TO FACE EVALUATION WAS PERFORMED  Earlena Werst Lorie Phenix 05/24/2020, 12:04 PM

## 2020-05-24 NOTE — Plan of Care (Signed)
Pt's plan of care adjusted to 15/7 after speaking with care team and discussed with PA as pt currently unable to tolerate current therapy schedule with OT, PT, and SLP.   Apolinar Junes PT, DPT

## 2020-05-24 NOTE — Progress Notes (Signed)
Physical Therapy Session Note  Patient Details  Name: [name] MRN: [id number] Date of Birth: 1945-08-26  Today's Date: 05/24/2020  PT Co-Treatment time: 0815-0830 PT Co-Treatment time Calculation (min): 15 min  PT Individual Time: 4259-5638 PT Individual Time Calculation (min): 45 min   Short Term Goals: Week 2:  PT Short Term Goal 1 (Week 2): Patient will sit OOB >1 hour 2x per day 4/7 days this week. PT Short Term Goal 2 (Week 2): Patient will perform bed mobility with mod A consistently. PT Short Term Goal 3 (Week 2): Patient will perform basic transfers with max A using LRAD.  Skilled Therapeutic Interventions/Progress Updates:     Session 1: Patient in bed with SLP, PT joined [name], SLP to assist with positioning for co-treat. Patient performed rolling L with mod A for trunk management and R hand placement on bed rail. Dr. Posey Pronto examined patient's sacral wound with patient positioned in side-lying. Patient performed L side-lying to sitting with max A +2 with increased L lean this morning. Worked on sitting balance and midline orientation x10 min EOB. Patient with increased pushing through R hand. Placed a banana in patient's hand to have him eat with cues from SLP to reduce pushing. Provided visual target and utilized mirroring technique with therapist in front of patient to cue patient to return to midline. Patient with increased anxiety and poor frustration tolerance with fear of falling in sitting throughout. Responded intermittently to emotional support from therapists. Returned to lying with max A and scooted up in bed with total A +2. Patient in bed with breaks locked, bed alarm set, and all needs in reach at end of session.   Session 2: Patient sitting up in the bed with his wife and NT in the room assisting with patient's lunch upon PT arrival. Patient alert and agreeable to PT session. Patient denied pain during session.  Therapeutic Activity: Bed Mobility: Patient  performed supine to/from sit with max A of 1 person. Provided verbal cues for rolling to L and using R hand on bed rail and L elbow to push up to sitting. Patient sat EOB >15 min worked on sitting balance and midline orientation with CGA-supervision for balance. Required min-mod A for balance while lifting his feet to don his pants with total A. Patient c/o sacral wound itching and attempting to scratch it. RN called and came to change patient's sacral dressing. Patient returned to supine with mod A with cues and facilitation to come down on his L elbow to lower his trunk then bring his legs onto the bed. Performed rolling R/L for peri-care due to soiled brief, doffed/donned brief, and RN changed sacral dressing in L side-lying. Patient then sat EOB again as above, but became increasingly agitated and requested to lie down, assisted as above. Patient comfortable in L side-lying, placed pillows between his knees for pressure relief and provided patient with 10 min break.  Returned after 10 min, patient resting on his L side in bed with his wife at bedside. Patient easily aroused and declined all mobility, stating "I finally feel comfortable and am resting." Patient missed 30 min of skilled PT due to fatigue, RN made aware. Will attempt to make-up missed time as able.     Therapy Documentation Precautions:  Precautions Precautions: Fall Precaution Comments: Peripheral visual field cut L>R, seizures Restrictions Weight Bearing Restrictions: No Other Position/Activity Restrictions: on 2-4 L/min O2 General: PT Amount of Missed Time (min): 30 Minutes   Therapy/Group: Individual Therapy  Jenifer Struve L [name] PT, DPT  05/24/2020, 2:09 PM

## 2020-05-24 NOTE — Progress Notes (Signed)
[facility] ([facility]) Community Based Palliative Care  This patient has been referred to our palliative care services in the community. [facility] will continue to follow for any discharge planning needs and to coordinate admission onto palliative care.  Thank you for the opportunity to participate in this patient's care.  Jennifer Woody RN, BSN, CCRN [facility] Hospital Liaison  

## 2020-05-24 NOTE — Progress Notes (Signed)
Speech Language Pathology Daily Session Note  Patient Details  Name: [name] MRN: [id number] Date of Birth: Mar 28, 1946  Today's Date: 05/24/2020 SLP Individual Time: 3875-6433 SLP Individual Time Calculation (min): 45 min and Today's Date: 05/24/2020 SLP Co-Treatment Time: 0815-0830 SLP Co-Treatment Time Calculation (min): 15 min (Co-tx with PT (CG))  Short Term Goals: Week 2: SLP Short Term Goal 1 (Week 2): Pt will sustain attention to functional tasks for 10 minute intervals with Max A cues for redirection. SLP Short Term Goal 2 (Week 2): Pt will orient to date, place, and situation with Mod A cues for use of external aids or other strategies. SLP Short Term Goal 3 (Week 2): Pt will identify 2 cognitive and 2 physical impairment in setting of acute hospitalization with Mod A multimodal cues. SLP Short Term Goal 4 (Week 2): Pt will use strategies for speech fluency/reducing verbal repetitions with Mod A multimodal cues. SLP Short Term Goal 5 (Week 2): Pt will consume dys 3 textures and thin liquids with min cues for use of swallowing precautions and minimal overt s/s of aspiration. SLP Short Term Goal 6 (Week 2): Patient will demonstrate functional problem solving for basic and familiar tasks with Mod A multimodal cues.  Skilled Therapeutic Interventions: Skilled treatment session focused on cognitive and dysphagia goals. Upon arrival, patient was awake in bed but appeared frustrated due to pain. Patient attempted to reposition the patient without success while patient appeared restless and verbally frustrated with the clinician. Due to this, patient unable to follow commands. SLP stepped away for ~2 minutes to allow patient to calm down. Patient eventually became tearful and apologized for behavior. Patient was independently oriented to place and city but required total A for orientation to month. Patient eventually sat EOB (with assist from PT) in order to maximize arousal and comfort for  PO intake. Patient consumed his banana and thin liquids via straw without overt s/s of aspiration but required frequent redirection to self-feeding due to strong left lean with perception that he was falling. Patient transferred back to bed at end of session and left with with alarm on and all needs within reach. Continue with current plan of care.      Pain Discomfort in sacrum, RN made aware and patient repositioned.   Therapy/Group: Individual Therapy  Chonda Baney 05/24/2020, 12:31 PM

## 2020-05-24 NOTE — Progress Notes (Signed)
Occupational Therapy Session Note  Patient Details  Name: [name] MRN: [id number] Date of Birth: 11/17/1945  Today's Date: 05/24/2020 OT Individual Time: 1000-1040 OT Individual Time Calculation (min): 40 min    Short Term Goals: Week 1:  OT Short Term Goal 1 (Week 1): Pt will maintain EOB sitting balance with mod A OT Short Term Goal 1 - Progress (Week 1): Met OT Short Term Goal 2 (Week 1): Pt will set up grooming tasks with supervision to demo improved motor planning OT Short Term Goal 2 - Progress (Week 1): Met OT Short Term Goal 3 (Week 1): Pt will don shirt with mod A to demo improved sequencing OT Short Term Goal 3 - Progress (Week 1): Met OT Short Term Goal 4 (Week 1): Pt will require no more than min cueing to attend to dressing task OT Short Term Goal 4 - Progress (Week 1): Met  Skilled Therapeutic Interventions/Progress Updates:    1:1. Pt received in bed agreeable to OT reporting improved sleep last night. Pt requires total A to doff shirt despite HOH A for initiation d/t L inattention and peripheral visual deficits pt does not attend or fixate on L side. Pt getting very agitated with doffing and donning shirt requiring rest break to mange agitation. Pt eventually requires max-total A to don shirt. Pt requires total A to sit EOB fading to MIN A overall for static sitting balance. Pt complets 5 sit to stands with MAX A of 2 with facilitation at chest and pelvis for upright posture as well as gaze forward. Pt requires prolonged rest and redirection/distraction at EOB in between stands to remain calm. Pt refusing any more tx and Exited session with pt seated in bed, exit alarm on and call light in reach. Pt missed 20 min skilled OT   Therapy Documentation Precautions:  Precautions Precautions: Fall Precaution Comments: Peripheral visual field cut L>R, seizures Restrictions Weight Bearing Restrictions: No Other Position/Activity Restrictions: on 2-4 L/min O2 General:    Vital Signs: Therapy Vitals Temp: 98.4 F (36.9 C) Pulse Rate: 75 Resp: 14 BP: (!) 148/79 Patient Position (if appropriate): Lying Oxygen Therapy SpO2: 98 % O2 Device: Room Air Pain:   ADL: ADL Eating: Minimal assistance Where Assessed-Eating: Bed level Grooming: Minimal assistance,Moderate cueing Where Assessed-Grooming: Edge of bed Upper Body Bathing: Minimal assistance,Moderate cueing Where Assessed-Upper Body Bathing: Edge of bed Lower Body Bathing: Dependent,Moderate cueing Where Assessed-Lower Body Bathing: Bed level Upper Body Dressing: Maximal assistance,Moderate cueing Where Assessed-Upper Body Dressing: Edge of bed Lower Body Dressing: Dependent Where Assessed-Lower Body Dressing: Bed level Toileting: Unable to assess Toilet Transfer Method: Unable to assess Vision   Perception    Praxis   Exercises:   Other Treatments:     Therapy/Group: Individual Therapy  Tonny Branch 05/24/2020, 6:48 AM

## 2020-05-24 NOTE — Progress Notes (Signed)
CXR with LLL atelectasis/mild effusion. Flutter valve qid with RN to assist. Will educate wife also to help with compliance.

## 2020-05-25 LAB — GLUCOSE, CAPILLARY
Glucose-Capillary: 230 mg/dL — ABNORMAL HIGH (ref 70–99)
Glucose-Capillary: 173 mg/dL — ABNORMAL HIGH (ref 70–99)
Glucose-Capillary: 205 mg/dL — ABNORMAL HIGH (ref 70–99)
Glucose-Capillary: 201 mg/dL — ABNORMAL HIGH (ref 70–99)

## 2020-05-25 NOTE — Progress Notes (Signed)
Physical Therapy Session Note  Patient Details  Name: [name] MRN: [id number] Date of Birth: Jun 29, 1945  Today's Date: 05/25/2020 PT Individual Time: 0945-1030 PT Individual Time Calculation (min): 45 min  PT Amount of Missed Time (min): 30 Minutes PT Missed Treatment Reason: Patient unwilling to participate;Patient fatigue  Short Term Goals: Week 2:  PT Short Term Goal 1 (Week 2): Patient will sit OOB >1 hour 2x per day 4/7 days this week. PT Short Term Goal 2 (Week 2): Patient will perform bed mobility with mod A consistently. PT Short Term Goal 3 (Week 2): Patient will perform basic transfers with max A using LRAD.  Skilled Therapeutic Interventions/Progress Updates:    Pt received sidelying in bed, agreeable with max encouragement to participate in therapy session. No complaints of pain at rest, reports pain in B ankles with mobility that subsides at rest. Pt on 2L O2 via [place] throughout session, SpO2 remains at 96% and above with activity. Sidelying to sitting EOB with assist x 2 for BLE management and trunk elevation. Pt with L lateral lean in sitting, requires max multimodal cuing to correct. Placed chair with armrests in front of patient for BUE support and to encourage anterior lean as pt also tends to fall posteriorly. Pt very fearful during sitting of falling, provided max encouragement and assured patient he was safe in supported sitting position with assist x 2 to prevent him from falling. Attempt to have pt reach to the R while in sitting to encourage weight shift, pt fearful and refuses to perform any reaching in sitting. Pt requests to return to supine. Sit to supine max A needed for trunk control and BLE management. Assisted pt dependently to scoot towards HOB. Pt reports urge to urinate, is setup A to continently void in urinal. Pt agreeable to sit up to EOB again. Supine to sit similar manner as stated above. Once seated EOB pt again becomes fearful and states he feels is  going to "roll down the road". Pt requests to return to supine. Sit to supine max A in similar manner as stated above. Pt left semi-reclined in bed with needs in reach. Pt missed 30 min of scheduled therapy session due to refusal to participate.  Therapy Documentation Precautions:  Precautions Precautions: Fall Precaution Comments: Peripheral visual field cut L>R, seizures Restrictions Weight Bearing Restrictions: No Other Position/Activity Restrictions: on 2-4 L/min O2   Therapy/Group: Individual Therapy   Excell Seltzer, PT, DPT  05/25/2020, 4:17 PM

## 2020-05-25 NOTE — Progress Notes (Signed)
Speech Language Pathology Daily Session Note  Patient Details  Name: [name] MRN: [id number] Date of Birth: [birth year]  Today's Date: 05/25/2020 SLP Individual Time: 0800-0850 SLP Individual Time Calculation (min): 50 min  Short Term Goals: Week 2: SLP Short Term Goal 1 (Week 2): Pt will sustain attention to functional tasks for 10 minute intervals with Max A cues for redirection. SLP Short Term Goal 2 (Week 2): Pt will orient to date, place, and situation with Mod A cues for use of external aids or other strategies. SLP Short Term Goal 3 (Week 2): Pt will identify 2 cognitive and 2 physical impairment in setting of acute hospitalization with Mod A multimodal cues. SLP Short Term Goal 4 (Week 2): Pt will use strategies for speech fluency/reducing verbal repetitions with Mod A multimodal cues. SLP Short Term Goal 5 (Week 2): Pt will consume dys 3 textures and thin liquids with min cues for use of swallowing precautions and minimal overt s/s of aspiration. SLP Short Term Goal 6 (Week 2): Patient will demonstrate functional problem solving for basic and familiar tasks with Mod A multimodal cues.  Skilled Therapeutic Interventions: Pt seen for skilled ST services focusing on dysphagia and cognition. Pt initially agitated, unable to effectively communicate basic wants and needs (I.e. refusing AM meal d/t needing to utilize bathroom, NT and SLP attempting to get patient out of bed for bathroom and patient refusing stating he didn't need to. Pt agreeing to urinal then denying utilization, etc.). Pt given ~5 minute rest break, re-directed and then agreeable to participate. Pt seen with AM meal Dys 3 and thin lliquids, minimal intake of Dys 3 solids with oral phase mildly prolonged likely 2' generalized weakness. Pt seen with thin liquid via tsp, cup and straw demonstrating overt s/s aspiration 90% of trials, compensatory strategies ineffective in reducing s/s aspiration. NTL trials eliminated s/s  aspiration, diet downgraded to NTL for safety and remain Dys 3, nursing staff made aware. SLP facilitating orientation task by providing mod A verbal cues to increase recall and carryover of temporal concepts and recent medical events. Pt continues to demonstrate whole word and initial phrase repetition in structured and unstructured conversation, question neurogenic stutter? It does seem to frustrate patient at times when communicating. Pt left in bed in nursing care, cont ST POC.   Pain Pain Assessment Pain Scale: 0-10 Pain Score: 6  Pain Location: Buttocks Pain Intervention(s): Repositioned  Therapy/Group: Individual Therapy  Dewaine Conger 05/25/2020, 8:23 AM

## 2020-05-26 DIAGNOSIS — R569 Unspecified convulsions: Secondary | ICD-10-CM

## 2020-05-26 LAB — GLUCOSE, CAPILLARY
Glucose-Capillary: 243 mg/dL — ABNORMAL HIGH (ref 70–99)
Glucose-Capillary: 181 mg/dL — ABNORMAL HIGH (ref 70–99)
Glucose-Capillary: 164 mg/dL — ABNORMAL HIGH (ref 70–99)
Glucose-Capillary: 171 mg/dL — ABNORMAL HIGH (ref 70–99)

## 2020-05-26 MED ORDER — ORAL CARE MOUTH RINSE
15.0000 mL | Freq: Four times a day (QID) | OROMUCOSAL | Status: DC
  Administered 2020-05-26 – 2020-06-10 (×23): 15 mL via OROMUCOSAL

## 2020-05-26 NOTE — Progress Notes (Signed)
Speech Language Pathology Daily Session Note  Patient Details  Name: WINTON OFFORD MRN: [id number] Date of Birth: 1945-10-20  Today's Date: 05/26/2020 SLP Individual Time: 0935-1000 SLP Individual Time Calculation (min): 25 min  Short Term Goals: Week 2: SLP Short Term Goal 1 (Week 2): Pt will sustain attention to functional tasks for 10 minute intervals with Max A cues for redirection. SLP Short Term Goal 2 (Week 2): Pt will orient to date, place, and situation with Mod A cues for use of external aids or other strategies. SLP Short Term Goal 3 (Week 2): Pt will identify 2 cognitive and 2 physical impairment in setting of acute hospitalization with Mod A multimodal cues. SLP Short Term Goal 4 (Week 2): Pt will use strategies for speech fluency/reducing verbal repetitions with Mod A multimodal cues. SLP Short Term Goal 5 (Week 2): Pt will consume dys 3 textures and thin liquids with min cues for use of swallowing precautions and minimal overt s/s of aspiration. SLP Short Term Goal 6 (Week 2): Patient will demonstrate functional problem solving for basic and familiar tasks with Mod A multimodal cues.  Skilled Therapeutic Interventions:  Pt was seen for skilled ST targeting goals for dysphagia.  Pt was asleep upon arrival.  He briefly awakened to voice and light touch but quickly fell back to sleep and indicated to therapist that he did not wish to participate in treatment today.  He was not sufficiently alert for PO trials but was agreeable to allowing oral care to minimize bacterial load and reduce risk for infection.  Total assist provided for oral care due to pt lethargy.  SLP explained rationale for oral care and pt accepted toothbrush, toothpaste, and yankeur without difficulty.   Pt was oriented to day of the week and month but disoriented to place, [place]  He was reoriented to place with mod verbal cues.  Pt did complain of pain in his left calf which was  mitigated with repositioning.  Pt was laying across the bed and question if his leg had become wedged in between bed rail and air mattress.  Pt was left in bed with call bell within reach.  Continue per current plan of care.    Pain Pain Assessment Pain Scale: Faces Faces Pain Scale: Hurts a little bit Pain Type: Acute pain Pain Location: Leg Pain Orientation: Left Pain Descriptors / Indicators: Aching Pain Intervention(s): Repositioned  Therapy/Group: Individual Therapy  Ragan Duhon, Selinda Orion 05/26/2020, 1:10 PM

## 2020-05-26 NOTE — Progress Notes (Signed)
Occupational Therapy Session Note  Patient Details  Name: [name] MRN: [id number] Date of Birth: 05-Jan-1946  Today's Date: 05/26/2020 OT Individual Time: 1400-1450 OT Individual Time Calculation (min): 50 min    Short Term Goals: Week 2:  OT Short Term Goal 1 (Week 2): Pt will complete bed mobility supine > EOB with mod A OT Short Term Goal 2 (Week 2): Pt will complete UB bathing with CGA seated EOB OT Short Term Goal 3 (Week 2): Pt will demonstrate emergent awareness with mod cueing OT Short Term Goal 4 (Week 2): Pt will complete UB dressing with min A  Skilled Therapeutic Interventions/Progress Updates:    Pt received supine with no c/o pain at rest. Pt requesting to get OOB to w/c. His brief was checked for incontinence and he was unaware of both urine and bowel incontinence. He required mod-max +2 to roll R and L with HOH facilitation for reaching for brief change and for donning pants dependently. He was able to complete anterior peri hygiene supine with min facilitation for sequencing/attention to task. Pt was transferred from supine to sitting EOB with max +2 assist. He immediately began c/o pain seated and became very agitated requesting to go back supine. He reported need to have a BM and a bedpan was placed. No void. Pt was positioned in bed with pillows assisting him to R sidelying to promote midline orientation. Discussed CLOF and POC moving forward with pt's wife. Pt left supine with all needs met.   Therapy Documentation Precautions:  Precautions Precautions: Fall Precaution Comments: Peripheral visual field cut L>R, seizures Restrictions Weight Bearing Restrictions: No Other Position/Activity Restrictions: on 2-4 L/min O2  Therapy/Group: Individual Therapy  Curtis Sites 05/26/2020, 6:31 AM

## 2020-05-26 NOTE — Progress Notes (Signed)
[place] [facility] PHYSICAL MEDICINE & REHABILITATION PROGRESS NOTE   Subjective/Complaints: Patient seen laying in bed this morning.  He states he slept well overnight.  No reported issues overnight.  He is confused this morning.  He was evaluated by hospice, notes to follow-up as outpatient.  ROS: Limited due to cognition.  Objective:   No results found. No results for input(s): WBC, HGB, HCT, PLT in the last 72 hours. No results for input(s): NA, K, CL, CO2, GLUCOSE, BUN, CREATININE, CALCIUM in the last 72 hours.  Intake/Output Summary (Last 24 hours) at 05/26/2020 0858 Last data filed at 05/25/2020 2357 Gross per 24 hour  Intake 360 ml  Output 300 ml  Net 60 ml     Pressure Injury 05/14/20 Sacrum Mid Unstageable - Full thickness tissue loss in which the base of the injury is covered by slough (yellow, tan, gray, green or brown) and/or eschar (tan, brown or black) in the wound bed. previously documented as stage 2,  (Active)  05/14/20 0132  Location: Sacrum  Location Orientation: Mid  Staging: Unstageable - Full thickness tissue loss in which the base of the injury is covered by slough (yellow, tan, gray, green or brown) and/or eschar (tan, brown or black) in the wound bed.  Wound Description (Comments): previously documented as stage 2, now unsteagable with with soft black tissue covering wound base  Present on Admission: Yes    Physical Exam: Vital Signs Blood pressure 118/74, pulse 62, temperature 98.4 F (36.9 C), temperature source Oral, resp. rate 18, height 5\' 11"  (1.803 m), weight 96 kg, SpO2 98 %. Constitutional: No distress . Vital signs reviewed. HENT: Normocephalic.  Atraumatic. Eyes: EOMI. No discharge. Cardiovascular: No JVD.  RRR. Respiratory: Normal effort.  No stridor.  Bilateral clear to auscultation.  + Rives. GI: Non-distended.  BS +. Skin: Warm and dry.   Sacral ulcer not examined today. Psych: Confused.  Normal behavior. Musc: No edema in extremities.  No  tenderness in extremities. Neuro: Alert and oriented x1, unchanged Motor: RUE/RLE: 4/5 proximal distal Left neglect LUE/LLE: ?3/5 proximal to distal,?  Unchanged  Assessment/Plan: 1. Functional deficits which require 3+ hours per day of interdisciplinary therapy in a comprehensive inpatient rehab setting.  Physiatrist is providing close team supervision and 24 hour management of active medical problems listed below.  Physiatrist and rehab team continue to assess barriers to discharge/monitor patient progress toward functional and medical goals  Care Tool:  Bathing    Body parts bathed by patient: Right arm,Left arm,Chest,Abdomen,Face   Body parts bathed by helper: Front perineal area,Buttocks,Right upper leg,Left upper leg,Right lower leg,Left lower leg     Bathing assist Assist Level: Maximal Assistance - Patient 24 - 49%     Upper Body Dressing/Undressing Upper body dressing   What is the patient wearing?: Pull over shirt    Upper body assist Assist Level: Maximal Assistance - Patient 25 - 49%    Lower Body Dressing/Undressing Lower body dressing      What is the patient wearing?: Incontinence brief     Lower body assist Assist for lower body dressing: Dependent - Patient 0%     Toileting Toileting Toileting Activity did not occur (Clothing management and hygiene only): N/A (no void or bm)  Toileting assist Assist for toileting: 2 Helpers     Transfers Chair/bed transfer  Transfers assist  Chair/bed transfer activity did not occur: Safety/medical concerns (decreased strength/activity tolerance)  Chair/bed transfer assist level: Dependent - mechanical lift     Locomotion  Ambulation   Ambulation assist   Ambulation activity did not occur: Safety/medical concerns (decreased strength/activity tolerance)          Walk 10 feet activity   Assist  Walk 10 feet activity did not occur: Safety/medical concerns        Walk 50 feet  activity   Assist Walk 50 feet with 2 turns activity did not occur: Safety/medical concerns         Walk 150 feet activity   Assist Walk 150 feet activity did not occur: Safety/medical concerns         Walk 10 feet on uneven surface  activity   Assist Walk 10 feet on uneven surfaces activity did not occur: Safety/medical concerns         Wheelchair     Assist Will patient use wheelchair at discharge?: Yes (Per PT long term goals)   Wheelchair activity did not occur: Safety/medical concerns (decreased strength/activity tolerance to transfer to w/c on eval)         Wheelchair 50 feet with 2 turns activity    Assist    Wheelchair 50 feet with 2 turns activity did not occur: Safety/medical concerns       Wheelchair 150 feet activity     Assist  Wheelchair 150 feet activity did not occur: Safety/medical concerns       Blood pressure 118/74, pulse 62, temperature 98.4 F (36.9 C), temperature source Oral, resp. rate 18, height 5\' 11"  (1.803 m), weight 96 kg, SpO2 98 %.  Medical Problem List and Plan: 1.Functional and mobility deficitssecondary to debility after recurrent seizures and associated respiratory failure. Resolving encephalopathy.  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 Antithrombotics: -DVT/anticoagulation:Pharmaceutical:Heparin.  -antiplatelet therapy: N/a 3. Pain Management:Tylenol prn 4. Mood/agitation: -antipsychotic agents: Continue low dose seroquel to help with sundowning   -limit benzos  -continue pyridoxine  Improving 5. Neuropsych: This patientis not fullycapable of making decisions onhisown behalf. 6. Skin/Wound Care:Routine pressure relief measures. -prevalon boots for bilateral heels -encourage adequate PO intake 7. Fluids/Electrolytes/Nutrition:encourage PO  -low protein  state   -offering protein supps  8. HTN: Monitor BP tid--continue Norvasc and metoprolol  Controlled on 2/6 9. Steroid induced hyperglycemia: Monitor BS ac/hs and use SSI for elevated BS.  amaryl 1mg  daily 1/25, increased to 2 mg on 2/3  Remains elevated on 2/6, consider further medication increase tomorrow if persistent 10. GBM/Seizures: Has been seizure free on Keppra 2000 mg bid.  -continue decadron 4mg  bid as above  -see #5  No breakthrough seizures since admission to rehab  12. Hypoxia: Currently on 2 L per [place]   Continue supplemental oxygen per [place].wean as possible May need to go home on O2 Encourage IS, deep breathing, OOB 13.  Acute lower UTI  Continue amoxicillin x10 days total-3 2/7 14.  Dysphagia  D3 thins, advance diet as tolerated    LOS: 13 days A FACE TO FACE EVALUATION WAS PERFORMED  Walter Grima Lorie Phenix 05/26/2020, 8:58 AM

## 2020-05-26 NOTE — Progress Notes (Signed)
Physical Therapy Session Note  Patient Details  Name: [name] MRN: [id number] Date of Birth: 16-Sep-1945  Today's Date: 05/26/2020 PT Individual Time: 0800-0900 PT Individual Time Calculation (min): 60 min   Short Term Goals: Week 2:  PT Short Term Goal 1 (Week 2): Patient will sit OOB >1 hour 2x per day 4/7 days this week. PT Short Term Goal 2 (Week 2): Patient will perform bed mobility with mod A consistently. PT Short Term Goal 3 (Week 2): Patient will perform basic transfers with max A using LRAD.  Skilled Therapeutic Interventions/Progress Updates:    Pt received semi-reclined in bed, agreeable with encouragement to participate in therapy session. No complaints of pain at rest, appears to have non-specific pain with mobility and pt cries out in pain. Pt unable to to state specifically where he is experiencing pain and pain appears to subside at rest. Pt found without O2 via [place] in place and [place] broken. Replaced [place] and pt placed on 2L O2. Prior to donning O2 SpO2 at 93% on RA. Supine to sit with assist x 2 for LE management and trunk control. Pt initially with very heavy pushing posteriorly and to the R upon sitting up. Pt with increase in anxiety while in sitting position and reports fear of falling. Provided encouragement to patient. Placed stedy in front of patient for increased feeling of security and stability while seated EOB. Pt unable to stand to stedy to perform OOB transfer this date due to inability to follow cues and keep UE in place on stedy handle. While seated EOB with stedy for support and mod to max A for upright posture pt able to feed himself breakfast with setup A with use of RUE. Pt then again becomes anxious and irritable and requests to lay back down due to fatigue. Sit to supine assist x 2 for trunk control and LE management. Pt left semi-reclined in bed with needs in reach, bed alarm in place at end of session. SpO2 remains at 99% and higher while on 2L O2 throughout  session.  Therapy Documentation Precautions:  Precautions Precautions: Fall Precaution Comments: Peripheral visual field cut L>R, seizures Restrictions Weight Bearing Restrictions: No Other Position/Activity Restrictions: on 2-4 L/min O2   Therapy/Group: Individual Therapy   Excell Seltzer, PT, DPT  05/26/2020, 12:31 PM

## 2020-05-27 LAB — CBC
HCT: 33.7 % — ABNORMAL LOW (ref 39.0–52.0)
Hemoglobin: 11.3 g/dL — ABNORMAL LOW (ref 13.0–17.0)
MCH: 30.3 pg (ref 26.0–34.0)
MCHC: 33.5 g/dL (ref 30.0–36.0)
MCV: 90.3 fL (ref 80.0–100.0)
Platelets: 122 10*3/uL — ABNORMAL LOW (ref 150–400)
RBC: 3.73 MIL/uL — ABNORMAL LOW (ref 4.22–5.81)
RDW: 15.1 % (ref 11.5–15.5)
WBC: 4.1 10*3/uL (ref 4.0–10.5)
nRBC: 0.5 % — ABNORMAL HIGH (ref 0.0–0.2)

## 2020-05-27 LAB — GLUCOSE, CAPILLARY
Glucose-Capillary: 270 mg/dL — ABNORMAL HIGH (ref 70–99)
Glucose-Capillary: 205 mg/dL — ABNORMAL HIGH (ref 70–99)
Glucose-Capillary: 196 mg/dL — ABNORMAL HIGH (ref 70–99)
Glucose-Capillary: 151 mg/dL — ABNORMAL HIGH (ref 70–99)

## 2020-05-27 LAB — BASIC METABOLIC PANEL
Anion gap: 8 (ref 5–15)
BUN: 17 mg/dL (ref 8–23)
CO2: 30 mmol/L (ref 22–32)
Calcium: 8.5 mg/dL — ABNORMAL LOW (ref 8.9–10.3)
Chloride: 98 mmol/L (ref 98–111)
Creatinine, Ser: 0.59 mg/dL — ABNORMAL LOW (ref 0.61–1.24)
GFR, Estimated: >60 mL/min (ref >60)
Glucose, Bld: 263 mg/dL — ABNORMAL HIGH (ref 70–99)
Potassium: 4.2 mmol/L (ref 3.5–5.1)
Sodium: 136 mmol/L (ref 135–145)

## 2020-05-27 MED ORDER — GLIMEPIRIDE 2 MG PO TABS
4.0000 mg | ORAL_TABLET | Freq: Every day | ORAL | Status: DC
  Administered 2020-05-28 – 2020-06-11 (×14): 4 mg via ORAL
  Filled 2020-05-27 (×15): qty 2

## 2020-05-27 NOTE — Progress Notes (Signed)
Occupational Therapy Session Note  Patient Details  Name: [name] MRN: [id number] Date of Birth: [birth year]  Today's Date: 05/27/2020 OT Individual Time: 1031-2811 OT Individual Time Calculation (min): 20 min  and Today's Date: 05/27/2020 OT Missed Time: 10 Minutes Missed Time Reason: Patient fatigue   Session 2: OT Individual Time: 1400-1500 OT Individual Time Calculation (min): 60 min    Short Term Goals: Week 2:  OT Short Term Goal 1 (Week 2): Pt will complete bed mobility supine > EOB with mod A OT Short Term Goal 2 (Week 2): Pt will complete UB bathing with CGA seated EOB OT Short Term Goal 3 (Week 2): Pt will demonstrate emergent awareness with mod cueing OT Short Term Goal 4 (Week 2): Pt will complete UB dressing with min A  Skilled Therapeutic Interventions/Progress Updates:    Session 1: Pt received supine, just finished breakfast. He reported pain in his sacrum, where known wound is. He declined any OOB mobility or transfers. He was agreeable to brush his teeth with HOB elevated. Pt completed oral care with min cueing for initiation and set up assist overall. He required mod A to roll completely onto his L side to alleviate pressure from his sacrum and several pillows were used to further offset pressure. He declined any further participation and was left supine with all needs met. 10 min missed.    Session 2: Pt received supine with his wife present, resting soundly but agreeable to OT session. Pt unaware of bowel and bladder incontinence. +2 assistance obtained for rolling R and L d/t pt anxiety limiting his mobility. Heavy max +2 for rolling R and L for dependent peri hygiene. Pt required increased time for rest breaks d/t pain and fatigue. He was agreeable to get OOB via maximove. +2 max A to position sling and safely transfer pt to his TIS w/c. Max A to scoot pt back in chair and to position him in midline as he attempted to get into L lean. Large tilt provided to  offset pressure on his sacrum. Pt was left sitting up with all needs met ,his wife present.   Therapy Documentation Precautions:  Precautions Precautions: Fall Precaution Comments: Peripheral visual field cut L>R, seizures Restrictions Weight Bearing Restrictions: No Other Position/Activity Restrictions: on 2-4 L/min O2  Therapy/Group: Individual Therapy  Curtis Sites 05/27/2020, 6:48 AM

## 2020-05-27 NOTE — Progress Notes (Signed)
Physical Therapy Session Note  Patient Details  Name: [name] MRN: [id number] Date of Birth: 11-11-1945  Today's Date: 05/27/2020 PT Individual Time: 4665-9935 PT Individual Time Calculation (min): 30 min   Short Term Goals: Week 2:  PT Short Term Goal 1 (Week 2): Patient will sit OOB >1 hour 2x per day 4/7 days this week. PT Short Term Goal 2 (Week 2): Patient will perform bed mobility with mod A consistently. PT Short Term Goal 3 (Week 2): Patient will perform basic transfers with max A using LRAD.  Skilled Therapeutic Interventions/Progress Updates:     Patient in bed calling out for "help" upon PT arrival. Patient alert and agreeable to PT session. Patient reported generalized discomfort and sacral pain during session, RN made aware. PT provided repositioning, rest breaks, and distraction as pain interventions throughout session. Patient stopped calling for help upon PT entry and reported feeling "off." Noted [place] off and patient on RA, placed [place] and educated patient on importance of keeping it on. Patient stated understanding. Retrieved dynamap, SPO2 91% on 2 L/min.   Therapeutic Activity: Bed Mobility: Patient performed supine to/from sit with max A and rolling R with mod A. Provided verbal cues for reaching to R rail with [facility] assist rolling R, pushing up with his bottom elbow and top hand on bed rail to push to sitting, and placing bottom elbow, with facilitation, on the bed for improved trunk control when returning to lying.  Neuromuscular Re-ed: Patient performed the following activities: -sitting balance EOB focused on midline orientation with visual target on the R to correct L posterior lean, progressed from mod A-supervision >5 min -reciprocal scooting forwards and backwards x4 with mod-max A to facilitate forward trunk lean and isolated hip progression -pre-stand focused on forward weight shift with hands on RW x1 and back of arm chair x2, lifted his  Hips to 1/2 stand  each trial with max A +2, limited by fear of falling and decreased motor planning; required heavy facilitation and cues for hand placement on RW and chair due to poor motor planning  Patient in bed on 2 L/min O2 in R side-lying with pillows placed for pressure relief at end of session with breaks locked, bed alarm set, and all needs within reach. Reinforced wearing [place] and use of call bell for assistance at end of session.    Therapy Documentation Precautions:  Precautions Precautions: Fall Precaution Comments: Peripheral visual field cut L>R, seizures Restrictions Weight Bearing Restrictions: No Other Position/Activity Restrictions: on 2-4 L/min O2   Therapy/Group: Individual Therapy  Cherie L Grunenberg PT, DPT  05/27/2020, 4:28 PM

## 2020-05-27 NOTE — Progress Notes (Signed)
Physical Therapy Session Note  Patient Details  Name: [name] MRN: [id number] Date of Birth: July 11, 1945  Today's Date: 05/27/2020 PT Individual Time:  - 0 minutes  Missed time: 15 minutes     Short Term Goals: Week 2:  PT Short Term Goal 1 (Week 2): Patient will sit OOB >1 hour 2x per day 4/7 days this week. PT Short Term Goal 2 (Week 2): Patient will perform bed mobility with mod A consistently. PT Short Term Goal 3 (Week 2): Patient will perform basic transfers with max A using LRAD.  Skilled Therapeutic Interventions/Progress Updates:  Pt presents left side-lying in bed asleep.  Pt arouses to PT but continually stating "in a minute" to requests for participation in therapy.  Pt agitated w/ attempts by PT to initiate movement.  Education given on need to mobilize, including progress to independence and skin integrity w/ noted paddng/dressings to elbow and B heels, but unwlling to participate.  Pt remained in left side-lying position.       Therapy Documentation Precautions:  Precautions Precautions: Fall Precaution Comments: Peripheral visual field cut L>R, seizures Restrictions Weight Bearing Restrictions: No Other Position/Activity Restrictions: on 2-4 L/min O2 General: PT Amount of Missed Time (min): 30 Minutes PT Missed Treatment Reason: Patient unwilling to participate;Patient fatigue Vital Signs:  Pain: Pain Assessment Pain Scale: 0-10 Pain Score: 0-No pain    Therapy/Group: Individual Therapy  Ladoris Gene 05/27/2020, 9:19 AM

## 2020-05-27 NOTE — Progress Notes (Addendum)
[facility] PHYSICAL MEDICINE & REHABILITATION PROGRESS NOTE   Subjective/Complaints: No new issues overnight. Seems to be sleeping well. Didn't want to do much with therapy this morning however.   ROS: Limited due to cognitive/behavioral    Objective:   No results found. Recent Labs    05/27/20 0650  WBC 4.1  HGB 11.3*  HCT 33.7*  PLT 122*   Recent Labs    05/27/20 0650  NA 136  K 4.2  CL 98  CO2 30  GLUCOSE 263*  BUN 17  CREATININE 0.59*  CALCIUM 8.5*    Intake/Output Summary (Last 24 hours) at 05/27/2020 1027 Last data filed at 05/27/2020 0900 Gross per 24 hour  Intake 330 ml  Output 225 ml  Net 105 ml     Pressure Injury 05/14/20 Sacrum Mid Unstageable - Full thickness tissue loss in which the base of the injury is covered by slough (yellow, tan, gray, green or brown) and/or eschar (tan, brown or black) in the wound bed. previously documented as stage 2,  (Active)  05/14/20 0132  Location: Sacrum  Location Orientation: Mid  Staging: Unstageable - Full thickness tissue loss in which the base of the injury is covered by slough (yellow, tan, gray, green or brown) and/or eschar (tan, brown or black) in the wound bed.  Wound Description (Comments): previously documented as stage 2, now unsteagable with with soft black tissue covering wound base  Present on Admission: Yes    Physical Exam: Vital Signs Blood pressure (!) 128/92, pulse 94, temperature 98.2 F (36.8 C), resp. rate 19, height 5\' 11"  (1.803 m), weight 96 kg, SpO2 98 %. Constitutional: No distress . Vital signs reviewed. HEENT: EOMI, oral membranes moist Neck: supple Cardiovascular: RRR without murmur. No JVD    Respiratory/Chest: CTA Bilaterally without wheezes or rales. Normal effort    GI/Abdomen: BS +, non-tender, non-distended Ext: no clubbing, cyanosis, or edema Psych: just arousing. Remains sl confused. Skin: Warm and dry.   Sacral ulcer unchanged Musc: No edema in extremities.  No  tenderness in extremities. Neuro: Alert and oriented x1, unchanged Motor: RUE/RLE: 4/5 proximal distal Left neglect LUE/LLE: approx 3/5 proximal to distal. Stable  Assessment/Plan: 1. Functional deficits which require 3+ hours per day of interdisciplinary therapy in a comprehensive inpatient rehab setting.  Physiatrist is providing close team supervision and 24 hour management of active medical problems listed below.  Physiatrist and rehab team continue to assess barriers to discharge/monitor patient progress toward functional and medical goals  Care Tool:  Bathing    Body parts bathed by patient: Right arm,Left arm,Chest,Abdomen,Face   Body parts bathed by helper: Front perineal area,Buttocks,Right upper leg,Left upper leg,Right lower leg,Left lower leg     Bathing assist Assist Level: Maximal Assistance - Patient 24 - 49%     Upper Body Dressing/Undressing Upper body dressing   What is the patient wearing?: Pull over shirt    Upper body assist Assist Level: Maximal Assistance - Patient 25 - 49%    Lower Body Dressing/Undressing Lower body dressing      What is the patient wearing?: Incontinence brief     Lower body assist Assist for lower body dressing: Dependent - Patient 0%     Toileting Toileting Toileting Activity did not occur (Clothing management and hygiene only): N/A (no void or bm)  Toileting assist Assist for toileting: 2 Helpers     Transfers Chair/bed transfer  Transfers assist  Chair/bed transfer activity did not occur: Safety/medical concerns (decreased strength/activity tolerance)  Chair/bed transfer assist level: Dependent - mechanical lift     Locomotion Ambulation   Ambulation assist   Ambulation activity did not occur: Safety/medical concerns (decreased strength/activity tolerance)          Walk 10 feet activity   Assist  Walk 10 feet activity did not occur: Safety/medical concerns        Walk 50 feet  activity   Assist Walk 50 feet with 2 turns activity did not occur: Safety/medical concerns         Walk 150 feet activity   Assist Walk 150 feet activity did not occur: Safety/medical concerns         Walk 10 feet on uneven surface  activity   Assist Walk 10 feet on uneven surfaces activity did not occur: Safety/medical concerns         Wheelchair     Assist Will patient use wheelchair at discharge?: Yes (Per PT long term goals)   Wheelchair activity did not occur: Safety/medical concerns (decreased strength/activity tolerance to transfer to w/c on eval)         Wheelchair 50 feet with 2 turns activity    Assist    Wheelchair 50 feet with 2 turns activity did not occur: Safety/medical concerns       Wheelchair 150 feet activity     Assist  Wheelchair 150 feet activity did not occur: Safety/medical concerns       Blood pressure (!) 128/92, pulse 94, temperature 98.2 F (36.8 C), resp. rate 19, height 5\' 11"  (1.803 m), weight 96 kg, SpO2 98 %.  Medical Problem List and Plan: 1.Functional and mobility deficitssecondary to debility after recurrent seizures and associated respiratory failure. Resolving encephalopathy.  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 Antithrombotics: -DVT/anticoagulation:Pharmaceutical:Heparin.  -antiplatelet therapy: N/a 3. Pain Management:Tylenol prn 4. Mood/agitation: -antipsychotic agents: Continue low dose seroquel to help with sundowning   -limit benzos  -continue pyridoxine  -will bump HS seroquel to 25mg  for periodic agitation.  5. Neuropsych: This patientis not fullycapable of making decisions onhisown behalf. 6. Skin/Wound Care:Routine pressure relief measures. -prevalon boots for bilateral heels -encourage adequate PO  intake 7. Fluids/Electrolytes/Nutrition:encourage PO  -low protein state   -offering protein supps   -BUN trending up a bit but still WNL, push fluids 8. HTN: Monitor BP tid--continue Norvasc and metoprolol  Controlled on 2/6 9. Steroid induced hyperglycemia: Monitor BS ac/hs and use SSI for elevated BS.  amaryl 1mg  daily 1/25, increased to 2 mg on 2/3  2/7 CBG's still elevated although improved. Increase amaryl to 4mg  daily 10. GBM/Seizures: Has been seizure free on Keppra 2000 mg bid.  -continue decadron 4mg  bid as above  -see #5  No breakthrough seizures since admission to rehab  12. Hypoxia: Currently on 2 L per [place]   Continue supplemental oxygen per [email or web address] as possible May need to go home on O2 Encourage IS, deep breathing, OOB 13.  Acute lower UTI  Continue amoxicillin x10 days total-3 2/7 14.  Dysphagia  D3 thins, advance diet as tolerated 15. Thrombocytopenia: 122k 2/7 near baseline    LOS: 14 days [place] 05/27/2020, 10:27 AM

## 2020-05-28 LAB — GLUCOSE, CAPILLARY
Glucose-Capillary: 256 mg/dL — ABNORMAL HIGH (ref 70–99)
Glucose-Capillary: 194 mg/dL — ABNORMAL HIGH (ref 70–99)
Glucose-Capillary: 218 mg/dL — ABNORMAL HIGH (ref 70–99)
Glucose-Capillary: 162 mg/dL — ABNORMAL HIGH (ref 70–99)

## 2020-05-28 MED ORDER — QUETIAPINE FUMARATE 25 MG PO TABS
25.0000 mg | ORAL_TABLET | Freq: Every day | ORAL | Status: DC
  Administered 2020-05-28 – 2020-06-10 (×14): 25 mg via ORAL
  Filled 2020-05-28 (×14): qty 1

## 2020-05-28 NOTE — Plan of Care (Signed)
  Problem: RH Memory Goal: LTG Patient will use memory compensatory aids to (SLP) Description: LTG:  Patient will use memory compensatory aids to recall biographical/new, daily complex information with cues (SLP) Outcome: Not Applicable Note: Goal discharged due to lack of progress   Problem: RH Swallowing Goal: LTG Pt will demonstrate functional change in swallow as evidenced by bedside/clinical objective assessment (SLP) Description: LTG: Patient will demonstrate functional change in swallow as evidenced by bedside/clinical objective assessment (SLP) Outcome: Not Applicable Note: Goal discharged due to lack of progress   Problem: RH Cognition - SLP Goal: RH LTG Patient will demonstrate orientation with cues Description:  LTG:  Patient will demonstrate orientation to person/place/time/situation with cues (SLP)   Flowsheets (Taken 05/28/2020 0610) LTG: Patient will demonstrate orientation using cueing (SLP): Moderate Assistance - Patient 50 - 74% Note: Goal downgraded due to lack of progress   Problem: RH Problem Solving Goal: LTG Patient will demonstrate problem solving for (SLP) Description: LTG:  Patient will demonstrate problem solving for basic/complex daily situations with cues  (SLP) Flowsheets (Taken 05/28/2020 0610) LTG Patient will demonstrate problem solving for: Moderate Assistance - Patient 50 - 74% Note: Goal downgraded due to lack of progress   Problem: RH Attention Goal: LTG Patient will demonstrate this level of attention during functional activites (SLP) Description: LTG:  Patient will will demonstrate this level of attention during functional activites (SLP) Flowsheets (Taken 05/28/2020 0610) LTG: Patient will demonstrate this level of attention during cognitive/linguistic activities with assistance of (SLP): Moderate Assistance - Patient 50 - 74% Note: Goal downgraded due to lack of progress   Problem: RH Awareness Goal: LTG: Patient will demonstrate awareness during  functional activites type of (SLP) Description: LTG: Patient will demonstrate awareness during functional activites type of (SLP) Flowsheets (Taken 05/28/2020 0610) LTG: Patient will demonstrate awareness during cognitive/linguistic activities with assistance of (SLP): Maximal Assistance - Patient 25 - 49% Note: Goal downgraded due to lack of progress   Problem: RH Swallowing Goal: LTG Patient will consume least restrictive diet using compensatory strategies with assistance (SLP) Description: LTG:  Patient will consume least restrictive diet using compensatory strategies with assistance (SLP) Flowsheets (Taken 05/28/2020 0610) LTG: Pt Patient will consume least restrictive diet using compensatory strategies with assistance of (SLP): Minimal Assistance - Patient > 75% Note: Goal downgraded due to lack of progress Goal: LTG Patient will participate in dysphagia therapy to increase swallow function with assistance (SLP) Description: LTG:  Patient will participate in dysphagia therapy to increase swallow function with assistance (SLP) Flowsheets (Taken 05/28/2020 0610) LTG: Pt will participate in dysphagia therapy to increase swallow function with assistance of (SLP): Moderate Assistance - Patient 50 - 74% Note: Goal downgraded due to lack of progress

## 2020-05-28 NOTE — Progress Notes (Signed)
Speech Language Pathology Daily Session Note  Patient Details  Name: GRIFFEY NICASIO MRN: [id number] Date of Birth: [birth year]  Today's Date: 05/28/2020 SLP Individual Time: 0815-0900 SLP Individual Time Calculation (min): 45 min  Short Term Goals: Week 2: SLP Short Term Goal 1 (Week 2): Pt will sustain attention to functional tasks for 10 minute intervals with Max A cues for redirection. SLP Short Term Goal 2 (Week 2): Pt will orient to date, place, and situation with Mod A cues for use of external aids or other strategies. SLP Short Term Goal 3 (Week 2): Pt will identify 2 cognitive and 2 physical impairment in setting of acute hospitalization with Mod A multimodal cues. SLP Short Term Goal 4 (Week 2): Pt will use strategies for speech fluency/reducing verbal repetitions with Mod A multimodal cues. SLP Short Term Goal 5 (Week 2): Pt will consume dys 3 textures and thin liquids with min cues for use of swallowing precautions and minimal overt s/s of aspiration. SLP Short Term Goal 6 (Week 2): Patient will demonstrate functional problem solving for basic and familiar tasks with Mod A multimodal cues.  Skilled Therapeutic Interventions: Skilled treatment session focused on dysphagia and cognitive goals. SLP facilitated session by providing hand over hand assist for patient to self-feed liquids via straw. Patient consumed 8 oz of nectar-thick liquids without overt s/s of aspiration. Total A was needed for patient to consume Dys. 3 textures in which patient demonstrated efficient mastication with mild oral residue without overt s/s of aspiration. Recommend patient continue current diet. Patient appeared confused throughout session but demonstrated more appropriate social interactions. Patient continues to require frequent repositioning due to pain but requested to use the commode. Patient utilized the urinal and bedpan and was continent of bowel and bladder. Patient left upright in bed with NT present.  Continue with current plan of care.      Pain Pain Assessment Pain Scale: 0-10 Pain Score: 4  Pain Type: Acute pain Pain Location: Head Pain Orientation: Left Pain Descriptors / Indicators: Aching Pain Frequency: Constant Pain Onset: On-going Patients Stated Pain Goal: 2 Pain Intervention(s): Medication (See eMAR)  Therapy/Group: Individual Therapy  Elieser Tetrick 05/28/2020, 10:08 AM

## 2020-05-28 NOTE — Progress Notes (Signed)
Patient ID: [name], male   DOB: [birth year], 75 y.o.   MRN: [id number]  SW met with pt and pt wife in room to provide updates from team conference. Pt was in/out of sleep during visit. Pt wife reported she spoke with attending already. Wife is amenable to hospice, and is aware that attending is waiting on guidance from oncology on next steps.   [name], MSW, [place] Office: [phone] Cell: [phone] Fax: [phone]

## 2020-05-28 NOTE — Progress Notes (Addendum)
[facility] PHYSICAL MEDICINE & REHABILITATION PROGRESS NOTE   Subjective/Complaints: In bed with SLP working on breakfast. Needing cues to feed himself. Denies any problems at present  ROS: Limited due to cognitive/behavioral   Objective:   No results found. Recent Labs    05/27/20 0650  WBC 4.1  HGB 11.3*  HCT 33.7*  PLT 122*   Recent Labs    05/27/20 0650  NA 136  K 4.2  CL 98  CO2 30  GLUCOSE 263*  BUN 17  CREATININE 0.59*  CALCIUM 8.5*    Intake/Output Summary (Last 24 hours) at 05/28/2020 5427 Last data filed at 05/27/2020 1900 Gross per 24 hour  Intake 238 ml  Output --  Net 238 ml     Pressure Injury 05/14/20 Sacrum Mid Unstageable - Full thickness tissue loss in which the base of the injury is covered by slough (yellow, tan, gray, green or brown) and/or eschar (tan, brown or black) in the wound bed. previously documented as stage 2,  (Active)  05/14/20 0132  Location: Sacrum  Location Orientation: Mid  Staging: Unstageable - Full thickness tissue loss in which the base of the injury is covered by slough (yellow, tan, gray, green or brown) and/or eschar (tan, brown or black) in the wound bed.  Wound Description (Comments): previously documented as stage 2, now unsteagable with with soft black tissue covering wound base  Present on Admission: Yes    Physical Exam: Vital Signs Blood pressure (!) 147/87, pulse 82, temperature 98.2 F (36.8 C), resp. rate 16, height 5\' 11"  (1.803 m), weight 96 kg, SpO2 100 %. Constitutional: No distress . Vital signs reviewed. HEENT: EOMI, oral membranes moist Neck: supple Cardiovascular: RRR without murmur. No JVD    Respiratory/Chest: CTA Bilaterally without wheezes or rales. Normal effort    GI/Abdomen: BS +, non-tender, non-distended Ext: no clubbing, cyanosis, or edema Psych: pleasant, confused Skin: Warm and dry.   Sacral ulcer unchanged Musc: No edema in extremities.  No tenderness in extremities. Neuro: Alert  and oriented x1 again today. Speech clear. Motor: RUE/RLE: 4/5 proximal distal--can use right side with cueing Left neglect LUE/LLE: approx 3/5 proximal to distal. Stable  Assessment/Plan: 1. Functional deficits which require 3+ hours per day of interdisciplinary therapy in a comprehensive inpatient rehab setting.  Physiatrist is providing close team supervision and 24 hour management of active medical problems listed below.  Physiatrist and rehab team continue to assess barriers to discharge/monitor patient progress toward functional and medical goals  Care Tool:  Bathing    Body parts bathed by patient: Right arm,Left arm,Chest,Abdomen,Face   Body parts bathed by helper: Front perineal area,Buttocks,Right upper leg,Left upper leg,Right lower leg,Left lower leg     Bathing assist Assist Level: Maximal Assistance - Patient 24 - 49%     Upper Body Dressing/Undressing Upper body dressing   What is the patient wearing?: Pull over shirt    Upper body assist Assist Level: Maximal Assistance - Patient 25 - 49%    Lower Body Dressing/Undressing Lower body dressing      What is the patient wearing?: Incontinence brief     Lower body assist Assist for lower body dressing: Dependent - Patient 0%     Toileting Toileting Toileting Activity did not occur (Clothing management and hygiene only): N/A (no void or bm)  Toileting assist Assist for toileting: 2 Helpers     Transfers Chair/bed transfer  Transfers assist  Chair/bed transfer activity did not occur: Safety/medical concerns (decreased strength/activity tolerance)  Chair/bed transfer assist level: Dependent - mechanical lift     Locomotion Ambulation   Ambulation assist   Ambulation activity did not occur: Safety/medical concerns (decreased strength/activity tolerance)          Walk 10 feet activity   Assist  Walk 10 feet activity did not occur: Safety/medical concerns        Walk 50 feet  activity   Assist Walk 50 feet with 2 turns activity did not occur: Safety/medical concerns         Walk 150 feet activity   Assist Walk 150 feet activity did not occur: Safety/medical concerns         Walk 10 feet on uneven surface  activity   Assist Walk 10 feet on uneven surfaces activity did not occur: Safety/medical concerns         Wheelchair     Assist Will patient use wheelchair at discharge?: Yes (Per PT long term goals)   Wheelchair activity did not occur: Safety/medical concerns (decreased strength/activity tolerance to transfer to w/c on eval)         Wheelchair 50 feet with 2 turns activity    Assist    Wheelchair 50 feet with 2 turns activity did not occur: Safety/medical concerns       Wheelchair 150 feet activity     Assist  Wheelchair 150 feet activity did not occur: Safety/medical concerns       Blood pressure (!) 147/87, pulse 82, temperature 98.2 F (36.8 C), resp. rate 16, height 5\' 11"  (1.803 m), weight 96 kg, SpO2 100 %.  Medical Problem List and Plan: 1.Functional and mobility deficitssecondary to debility after recurrent seizures and associated respiratory failure. Resolving encephalopathy.  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 Antithrombotics: -DVT/anticoagulation:Pharmaceutical:Heparin.  -antiplatelet therapy: N/a 3. Pain Management:Tylenol prn 4. Mood/agitation: -antipsychotic agents: Continue low dose seroquel to help with sundowning   -limit benzos  -continue pyridoxine  Mood has been pretty calm. Remains confused 5. Neuropsych: This patientis not fullycapable of making decisions onhisown behalf. 6. Skin/Wound Care:Routine pressure relief measures. -prevalon boots for bilateral  heels -encourage adequate PO intake 7. Fluids/Electrolytes/Nutrition:encourage PO  -low protein state   -offering protein supps   -BUN trending up a bit but still WNL, push fluids 8. HTN: Monitor BP tid--continue Norvasc and metoprolol  Controlled on 2/6 9. Steroid induced hyperglycemia: Monitor BS ac/hs and use SSI for elevated BS.  amaryl 1mg  daily 1/25, increased to 2 mg on 2/3  2/7 CBG's still elevated although improved.   2/8 effective today, increase amaryl to 4mg  daily 10. GBM/Seizures: Has been seizure free on Keppra 2000 mg bid.  -continue decadron 4mg  bid as above  -see #5  No breakthrough seizures   12. Hypoxia: Currently on 2 L per [place]   Continue supplemental oxygen per [place].wean as possible May need to go home on O2 Encourage IS, deep breathing, OOB 13.  Acute lower UTI  10 days amoxil completed 14.  Dysphagia  D3 thins, advance diet as tolerated 15. Thrombocytopenia: 122k 2/7 near baseline    LOS: 15 days A FACE TO FACE EVALUATION WAS PERFORMED  Meredith Staggers 05/28/2020, 9:56 AM

## 2020-05-28 NOTE — Progress Notes (Signed)
Manufacturing engineer [facility]) Community Based Palliative Care  This patient has been referred to our palliative care services in the community. ACC will continue to follow for any discharge planning needs and to coordinate admission onto palliative care.  Thank you for the opportunity to participate in this patient's care.   [name], BSN, RN [facility] Liaison  [phone]

## 2020-05-28 NOTE — Patient Care Conference (Signed)
Inpatient RehabilitationTeam Conference and Plan of Care Update Date: 05/28/2020   Time: 10:07 AM    Patient Name: [name]      Medical Record Number: [id number]  Date of Birth: 25-Jul-1945 Sex: Male         Room/Bed: 4W11C/4W11C-01 Payor Info: Payor: MEDICARE / Plan: MEDICARE PART A AND B / Product Type: *No Product type* /    Admit Date/Time:  05/13/2020  3:12 PM  Primary Diagnosis:  Physical debility  Hospital Problems: Principal Problem:   Physical debility Active Problems:   Dysphagia   Acute lower UTI   Supplemental oxygen dependent   Steroid-induced hyperglycemia   Benign essential HTN   Seizures [facility])    Expected Discharge Date: Expected Discharge Date: 06/11/20  Team Members Present: Physician leading conference: Dr. Alger Simons Care Coodinator Present: [name], LCSWA;Trachelle Low Creig Hines, RN, BSN, [place] Nurse Present: Rayne Du, LPN PT Present: Apolinar Junes, PT OT Present: Laverle Hobby, OT SLP Present: Weston Anna, SLP PPS Coordinator present : Gunnar Fusi, SLP     Current Status/Progress Goal Weekly Team Focus  Bowel/Bladder   Cont w/incon episodes, LBM 05/27/2020  Less periods of incontinence  Timed toileting q2 hr and PRN   Swallow/Nutrition/ Hydration   Dys. 3 textures with nectar-thick liquids, Min A for use of swallow strategies  Supervision  safety and tolerance with current diet   ADL's   No functional improvement or gain this week. More agitated, resistive and agitated with care. Max +2-3  mod A overall for ADLs  ADL retraining, participation, cognition, d/c planning   Mobility             Communication   Supervision  Supervision  self-monitor and correct verbal repetitions   Safety/Cognition/ Behavioral Observations  Max A  Min A  orientation, participation, attention   Pain   Complains of pain in the sacrum  Pain score <3/10  Assess pain Qshift and PRN   Skin   Stage 2 to the sacrum  Prevent further skin  breakdown, turn and reposition pt q 2hours  Assess Qshift and PRN     Discharge Planning:  D/c to home with wife who will provide 24/7care. She is working on additional natural supports. One of their adult son's lives locally and will assist PRN. Outpatient palliative care referral made. Family edu 2/20 10am-3pm.   Team Discussion: Not much improvement. Alert and oriented 1-2. Repositioning helps with pain. Incontinent B/B. Dressing changes BID to sacral wound. Please pull wife in for family education. Pull Hospice in. Wife is willing to talk to Hospice, wants to talk to the Oncologist.  Patient on target to meet rehab goals: No functional improvement this week. More resistant and agitated with care. Mod assist goals for ADL's. Patient focused on sitting supine with max assist. SLP focusing on orientation, participation and attention. Max assist goals.  *See Care Plan and progress notes for long and short-term goals.   Revisions to Treatment Plan:  Treating UTI.  Teaching Needs: Continue family education, skin/wound care, medication management, endurance training, bed mobility training, oxygen training, transfer training  Current Barriers to Discharge: Inaccessible home environment, Decreased caregiver support, Medical stability, Home enviroment access/layout, Incontinence, Wound care, Lack of/limited family support, Medication compliance, Behavior, Nutritional means and New oxygen  Possible Resolutions to Barriers: Continue current medications, wound/skin care education, provide emotional support to patient and family.     Medical Summary Current Status: minimal neuro progress. more alert but still confused. incontinent. continued sacral wound.  Barriers to Discharge: Medical stability   Possible Resolutions to Barriers/Weekly Focus: rx uti, lab/data review, skin care, family counseling   Continued Need for Acute Rehabilitation Level of Care: The patient requires daily medical  management by a physician with specialized training in physical medicine and rehabilitation for the following reasons: Direction of a multidisciplinary physical rehabilitation program to maximize functional independence : Yes Medical management of patient stability for increased activity during participation in an intensive rehabilitation regime.: Yes Analysis of laboratory values and/or radiology reports with any subsequent need for medication adjustment and/or medical intervention. : Yes   I attest that I was present, lead the team conference, and concur with the assessment and plan of the team.   [name] 05/28/2020, 1:31 PM

## 2020-05-28 NOTE — Progress Notes (Signed)
Occupational Therapy Session Note  Patient Details  Name: [name] MRN: [id number] Date of Birth: 1945/07/07  Today's Date: 05/28/2020 OT Individual Time: 0737-1062 OT Individual Time Calculation (min): 55 min    Short Term Goals: Week 2:  OT Short Term Goal 1 (Week 2): Pt will complete bed mobility supine > EOB with mod A OT Short Term Goal 2 (Week 2): Pt will complete UB bathing with CGA seated EOB OT Short Term Goal 3 (Week 2): Pt will demonstrate emergent awareness with mod cueing OT Short Term Goal 4 (Week 2): Pt will complete UB dressing with min A  Skilled Therapeutic Interventions/Progress Updates:     Pt received supine with no c/o pain, L lean in bed and nasal cannula around his chest. 2L O2 reapplied correctly. Pt required heavy max +2 to roll R and L, with more tolerance for R rolling. Dependent brief change. Pt becoming agitated with staff for asking him to do things he "can't do", I.e lifting either leg or arm. Gentle deescalation and reorientation to task at hand provided. Increased time required throughout session for rest breaks d/t pain in his sacrum and fatigue. Maxi move sling was placed and pt was transferred to the TIS w/c. Multiple attempts made to try and increase pt comfort in w/c, including shifting hips forward and back, reclining chair in several different position and bringing trunk forward. Pt stood with 3 musketeers method with max +2 assist and was able to maintain stand with mod +2. He sat back down and was unable to be comforted d/t sacral wound pain. He was transferred back to bed via maximove. He completed oral care at bed level with min A. Pt was left supine with all needs within reach. Bed alarm set.   Therapy Documentation Precautions:  Precautions Precautions: Fall Precaution Comments: Peripheral visual field cut L>R, seizures Restrictions Weight Bearing Restrictions: No Other Position/Activity Restrictions: on 2-4 L/min O2  Therapy/Group:  Individual Therapy  Curtis Sites 05/28/2020, 6:16 AM

## 2020-05-28 NOTE — Progress Notes (Signed)
Physical Therapy Session Note  Patient Details  Name: [name] MRN: [id number] Date of Birth: 1945-08-09  Today's Date: 05/28/2020 PT Individual Time: 2122-4825 PT Individual Time Calculation (min): 40 min   Short Term Goals: Week 2:  PT Short Term Goal 1 (Week 2): Patient will sit OOB >1 hour 2x per day 4/7 days this week. PT Short Term Goal 2 (Week 2): Patient will perform bed mobility with mod A consistently. PT Short Term Goal 3 (Week 2): Patient will perform basic transfers with max A using LRAD.  Skilled Therapeutic Interventions/Progress Updates:    Pt received supine in bed with his wife, [name], present and pt eager to participate in session. [name] stating they have been talking and praying together today and reports pt is very motivated to get OOB today. Pt noted to not have nasal cannula on and pt's wife states had chewed through nasal cannula breaking it - assessed SpO2 and 97% at rest and >92% with activity on RA throughout session - at end of session place 2L of O2 via nasal cannula back on pt. Supine>sitting L EOB, HOB flat but using bedrail, via logroll technique with max hand-over-hand assist to locate bedrail on L side due to visual impairments/inattention and mod cuing for sequencing - mod assist for rolling then max assist of 1 to bring trunk upright with constant L lateral lean initially due to sacral pain requiring mod/max assist to maintain upright. After sitting ~3 minutes progressed to only CGA for trunk support with improved midline position. Performed multiple repetitions ~8-10 of sit<>stands to/from significantly elevated EOB or stedy seat with heavy +2 max/total assist for lifting - pt inconsistently able to lift hips enough to place stedy set - was never able to come to a fully upright stand due to sustained trunk/hip/knee flexion and pt continuing to require strong +2 max assist to hold the partial standing position for ~3-5seconds each trial. Pt with  limited tolerance to high perched sitting on stedy seat due to pt feeling unsafe/unablance in that position and due to sacral pain therefore only rested in this position 2x. Pt required heavy +2 total assist to scoot hips back on EOB due to poor motor planning despite cuing for sequencing. Sit>supine with +2 mod assist for trunk descent and B LE management into the bed. Pt very fatigued and quickly starting to fall asleep once in this position. Supine scoot towards HOB with bed in trendelenburg via +2 dependent assist. Pt left in L sidelying with needs in reach and pillows placed for pressure relief - needs in reach, bed alarm on, and pt's wife present.   Therapy Documentation Precautions:  Precautions Precautions: Fall Precaution Comments: Peripheral visual field cut L>R, seizures Restrictions Weight Bearing Restrictions: No Other Position/Activity Restrictions: on 2-4 L/min O2  Pain:   Pt reports sacral pain during session - adjusted interventions and assisted with repositioning for pain management - left pt in L side for pressure relief at end of session.   Therapy/Group: Individual Therapy  Tawana Scale , PT, DPT, CSRS  05/28/2020, 3:29 PM

## 2020-05-29 LAB — GLUCOSE, CAPILLARY
Glucose-Capillary: 252 mg/dL — ABNORMAL HIGH (ref 70–99)
Glucose-Capillary: 208 mg/dL — ABNORMAL HIGH (ref 70–99)
Glucose-Capillary: 147 mg/dL — ABNORMAL HIGH (ref 70–99)
Glucose-Capillary: 121 mg/dL — ABNORMAL HIGH (ref 70–99)

## 2020-05-29 NOTE — Progress Notes (Signed)
Occupational Therapy Session Note  Patient Details  Name: [name] MRN: [id number] Date of Birth: 03-19-1946  Today's Date: 05/29/2020 OT Individual Time: 2248-2500 OT Individual Time Calculation (min): 47 min   Session 2: OT Individual Time: 1400-1415 OT Individual Time Calculation (min): 15 min    Short Term Goals: Week 2:  OT Short Term Goal 1 (Week 2): Pt will complete bed mobility supine > EOB with mod A OT Short Term Goal 2 (Week 2): Pt will complete UB bathing with CGA seated EOB OT Short Term Goal 3 (Week 2): Pt will demonstrate emergent awareness with mod cueing OT Short Term Goal 4 (Week 2): Pt will complete UB dressing with min A  Skilled Therapeutic Interventions/Progress Updates:    Pt received supine, calling out for help. Upon asking what he needed he states "I can't even remember, how are you doing today?'. Pt was agreeable initially to get OOB and don clothes. Pt unaware of urine incontinence. He completed peri hygiene anteriorly with mod cueing for thoroughness of task, arousal, and sequencing. He required max +2 to roll R and L for dependent donning of new brief. Increased time required for frequent rest breaks, both for fatigue and for pt getting agitated with OT for pain during rolling in his sacrum. As transfer was being prepped pt declined getting OOB. He also declined donning any new clothes. He was perseverative on being cold during the session. Pt was agreeable to finish eating his breakfast. He was positioned properly in bed with HOB elevated to 45 degrees. He was able to self feed several bites as well as accept some bites from OT serving fork. Pt requested a cup of coffee and a cup was obtained and thickened to nectar thick. Given via tsp d/t temperature. Pt declined any further activity and was left supine with all needs met, bed alarm set.   Session 2 :  Pt received supine in bed, wife present, requesting assist with getting brief back on pt. Pt completed  rolling R with increased independence, mod A. Brief was re-donned with max A at bed level. Pt completed side lying to sitting EOB with max +2 assist. Pt required intermittent max A for L lean and pushing posterior and L, with occasional only CGA required. Pt returned to supine with max A. Pt declined any further activity and was falling asleep. Pt left in R sidelying with all pressure off his sacral wound. Bed alarm set, wife present.    Therapy Documentation Precautions:  Precautions Precautions: Fall Precaution Comments: Peripheral visual field cut L>R, seizures Restrictions Weight Bearing Restrictions: No Other Position/Activity Restrictions: on 2-4 L/min O2 Therapy/Group: Individual Therapy  Curtis Sites 05/29/2020, 6:25 AM

## 2020-05-29 NOTE — Progress Notes (Signed)
Patient ID: [name], male   DOB: [birth year], 75 y.o.   MRN: [id number]  SW returned phone call to pt wife [name] to answer questions related to on next plan of care. SW informed still waiting on updates from our attending. SW informed if hospice becomes involved, there will be a formal discussion on process with discharging to home with their support, and what services they are able to offer. SW informed will provide updates once there is more information.   [name], MSW, [place] Office: [phone] Cell: [phone] Fax: [phone]

## 2020-05-29 NOTE — Progress Notes (Signed)
Speech Language Pathology Weekly Progress Note  Patient Details  Name: Torence C Reh MRN: [id number] Date of Birth: 09/06/1945  Beginning of progress report period: May 22, 2020 End of progress report period: May 29, 2020    Short Term Goals: Week 2: SLP Short Term Goal 1 (Week 2): Pt will sustain attention to functional tasks for 10 minute intervals with Max A cues for redirection. SLP Short Term Goal 1 - Progress (Week 2): Not met SLP Short Term Goal 2 (Week 2): Pt will orient to date, place, and situation with Mod A cues for use of external aids or other strategies. SLP Short Term Goal 2 - Progress (Week 2): Not met SLP Short Term Goal 3 (Week 2): Pt will identify 2 cognitive and 2 physical impairment in setting of acute hospitalization with Mod A multimodal cues. SLP Short Term Goal 3 - Progress (Week 2): Not met SLP Short Term Goal 4 (Week 2): Pt will use strategies for speech fluency/reducing verbal repetitions with Mod A multimodal cues. SLP Short Term Goal 4 - Progress (Week 2): Met SLP Short Term Goal 5 (Week 2): Pt will consume dys 3 textures and thin liquids with min cues for use of swallowing precautions and minimal overt s/s of aspiration. SLP Short Term Goal 5 - Progress (Week 2): Not met SLP Short Term Goal 6 (Week 2): Patient will demonstrate functional problem solving for basic and familiar tasks with Mod A multimodal cues. SLP Short Term Goal 6 - Progress (Week 2): Not met    New Short Term Goals: Week 3: SLP Short Term Goal 1 (Week 3): Pt will sustain attention to functional tasks for 10 minute intervals with Max A cues for redirection. SLP Short Term Goal 2 (Week 3): Pt will orient to date, place, and situation with Mod A cues for use of external aids or other strategies. SLP Short Term Goal 3 (Week 3): Pt will identify 2 cognitive and 2 physical impairment in setting of acute hospitalization with Mod A multimodal cues. SLP Short Term Goal 4 (Week 3): Pt  will use strategies for speech fluency/reducing verbal repetitions with Min A multimodal cues. SLP Short Term Goal 5 (Week 3): Pt will consume dys 3 textures and nectar-thick liquids with min cues for use of swallowing precautions and minimal overt s/s of aspiration. SLP Short Term Goal 6 (Week 3): Patient will demonstrate functional problem solving for basic and familiar tasks with Mod A multimodal cues.  Weekly Progress Updates: Patient continues to make slow and inconsistent gains and has met 1 of 6 STGs this reporting period. Currently, patient is consuming Dys. 3 textures and liquids were downgraded to nectar-thick due to consistent overt s/s of aspiration with thin liquids. Patient is consuming his current diet with minimal overt s/s of aspiration but requires Mod verbal cues for use of swallowing compensatory strategies. Patient also requires overall Max A multimodal cues to complete functional and familiar tasks safely in regards to attention, problem solving, recall, and awareness. Patient and family education ongoing. Patient would benefit from continued skilled SLP intervention to maximize his cognitive and swallowing function prior to discharge.      Intensity: Minumum of 1-2 x/day, 30 to 90 minutes Frequency: 3 to 5 out of 7 days Duration/Length of Stay: 06/11/20 Treatment/Interventions: Cognitive remediation/compensation;Cueing hierarchy;Internal/external aids;Speech/Language facilitation;Therapeutic Activities;Patient/family education;Functional tasks    ,  05/29/2020, 6:38 AM         

## 2020-05-29 NOTE — Progress Notes (Signed)
Physical Therapy Weekly Progress Note  Patient Details  Name: [name] MRN: [id number] Date of Birth: [birth year]  Beginning of progress report period: May 22, 2020 End of progress report period: May 29, 2020  Today's Date: 05/29/2020 PT Individual Time: 1000-1018 PT Individual Time Calculation (min): 18 min   Patient has met 0 of 3 short term goals.  Patient with minimal progress this week with functional mobility. Continues to require max-mod A of 1-2 for bed mobility, fluctuates based on fatigue levels and motor planning deficits, requires heavy max A of 2-3 people to perform standing or partial stand in the [place], and has not tolerated sitting OOB >1 hour this week due to sacral pain/discomfort and challenges with transfers. Has performed a lift transfer with the Maxi-move and tolerated well. Will focus therapy efforts on family education and adjust POC for dependent transfers, ROM, and positioning due to lack of progress with functional mobility.   Patient continues to demonstrate the following deficits muscle weakness and muscle joint tightness, decreased oxygen support, impaired timing and sequencing, decreased coordination and decreased motor planning, decreased midline orientation, decreased attention to right and decreased motor planning, decreased initiation, decreased attention, decreased awareness, decreased problem solving, decreased safety awareness, decreased memory and delayed processing and decreased sitting balance, decreased standing balance, decreased postural control, hemiplegia and decreased balance strategies and therefore will continue to benefit from skilled PT intervention to increase functional independence with mobility.  Patient not progressing toward long term goals.  See goal revision..  Plan of care revisions: Downgraded or d/c goals due to lack of progress and added goals to focus on caregiver independence with dependent lift transfers, bed mobility, and  positionging..  PT Short Term Goals Week 2:  PT Short Term Goal 1 (Week 2): Patient will sit OOB >1 hour 2x per day 4/7 days this week. PT Short Term Goal 1 - Progress (Week 2): Not met PT Short Term Goal 2 (Week 2): Patient will perform bed mobility with mod A consistently. PT Short Term Goal 2 - Progress (Week 2): Not progressing PT Short Term Goal 3 (Week 2): Patient will perform basic transfers with max A using LRAD. PT Short Term Goal 3 - Progress (Week 2): Not progressing Week 3:  PT Short Term Goal 1 (Week 3): Patient's family will initiate hoyer lift training. PT Short Term Goal 2 (Week 3): Patient will sit OOB for 1 hour at least 2/7 days this week. PT Short Term Goal 3 (Week 3): Patient's family will initiate training for bed mobility and positioning.  Skilled Therapeutic Interventions/Progress Updates:     Patient in bed asleep with [place] broken and off and B feet off the R side of the bed upon PT arrival. Patient aroused to verbal stimulation and maintained arousal ~5 min as PT applied a new [place], placing patient on 2L/min, and performed repositioning, then he had difficulty keeping his eyes open. Patient declined sitting EOB and getting OOB with the Maxi-move at this time due to increased fatigue. Required frequent cues to keep his eyes open as therapist offered therapeutic activities. Vitals: BP 120/72, HR 82, SPO2 100%.   Therapeutic Activity: Bed Mobility: Patient performed repositioning with total A of 1 person and and scooting up in the bed with total a of 2 due to increased fatigue/lethargy. PT placed both feet in the bed and repositioned patient's trunk and hips in midline, was leaning to the L with his hips far R. Placed a pillow under his L trunk  and positioned the patient semi-reclined with legs elevated in the bed for improved positioning for breath support and pressure relief.   Patient in bed asleep at end of session with breaks locked, bed alarm set, and all needs within  reach. Patient missed 42 min of skilled PT due to fatigue/lethargy, RN made aware. Will attempt to make-up missed time as able.     Therapy Documentation Precautions:  Precautions Precautions: Fall Precaution Comments: Peripheral visual field cut L>R, seizures Restrictions Weight Bearing Restrictions: No Other Position/Activity Restrictions: on 2-4 L/min O2 General: PT Amount of Missed Time (min): 42 Minutes PT Missed Treatment Reason: Patient fatigue  Therapy/Group: Individual Therapy  Gerilyn Stargell L Estiben Mizuno PT, DPT  05/29/2020, 10:38 AM

## 2020-05-30 ENCOUNTER — Ambulatory Visit: Payer: Medicare Other | Admitting: Podiatry

## 2020-05-30 LAB — GLUCOSE, CAPILLARY
Glucose-Capillary: 268 mg/dL — ABNORMAL HIGH (ref 70–99)
Glucose-Capillary: 272 mg/dL — ABNORMAL HIGH (ref 70–99)
Glucose-Capillary: 108 mg/dL — ABNORMAL HIGH (ref 70–99)
Glucose-Capillary: 143 mg/dL — ABNORMAL HIGH (ref 70–99)

## 2020-05-30 NOTE — Progress Notes (Signed)
Occupational Therapy Session Note  Patient Details  Name: [name] MRN: [id number] Date of Birth: October 02, 1945  Today's Date: 05/30/2020 OT Individual Time: 0700-0728 OT Individual Time Calculation (min): 28 min    Short Term Goals: Week 1:  OT Short Term Goal 1 (Week 1): Pt will maintain EOB sitting balance with mod A OT Short Term Goal 1 - Progress (Week 1): Met OT Short Term Goal 2 (Week 1): Pt will set up grooming tasks with supervision to demo improved motor planning OT Short Term Goal 2 - Progress (Week 1): Met OT Short Term Goal 3 (Week 1): Pt will don shirt with mod A to demo improved sequencing OT Short Term Goal 3 - Progress (Week 1): Met OT Short Term Goal 4 (Week 1): Pt will require no more than min cueing to attend to dressing task OT Short Term Goal 4 - Progress (Week 1): Met  Skilled Therapeutic Interventions/Progress Updates:    1:1. Pt received in bed requiring increased time to arouse. Despite attempts to engage pt in BADL grooming, pt adamantly refusing. Engaged in orientation conversation with pt seated in bed with waxing and waning arousal. Pt alerted OT that it was his wifes birthday. Agreeable to write a card. Pt requires max cuing d/t poor focused attention to recite what he wanted in the card. Pt signs card at bottom with RUE and mod VC for L attention to look at card in L visual field. Exited session with pt seated in bed, exit alarm on and call light in reach   Therapy Documentation Precautions:  Precautions Precautions: Fall Precaution Comments: Peripheral visual field cut L>R, seizures Restrictions Weight Bearing Restrictions: No Other Position/Activity Restrictions: on 2-4 L/min O2 General:   Vital Signs: Therapy Vitals Temp: 97.8 F (36.6 C) Temp Source: Oral Pulse Rate: 94 Resp: 18 BP: 135/88 Patient Position (if appropriate): Lying Oxygen Therapy SpO2: 99 % O2 Device: Nasal Cannula O2 Flow Rate (L/min): 2 L/min Pain:    ADL: ADL Eating: Minimal assistance Where Assessed-Eating: Bed level Grooming: Minimal assistance,Moderate cueing Where Assessed-Grooming: Edge of bed Upper Body Bathing: Minimal assistance,Moderate cueing Where Assessed-Upper Body Bathing: Edge of bed Lower Body Bathing: Dependent,Moderate cueing Where Assessed-Lower Body Bathing: Bed level Upper Body Dressing: Maximal assistance,Moderate cueing Where Assessed-Upper Body Dressing: Edge of bed Lower Body Dressing: Dependent Where Assessed-Lower Body Dressing: Bed level Toileting: Unable to assess Toilet Transfer Method: Unable to assess Vision   Perception    Praxis   Exercises:   Other Treatments:     Therapy/Group: Individual Therapy  Tonny Branch 05/30/2020, 6:38 AM

## 2020-05-30 NOTE — Progress Notes (Signed)
[employer]ufacturing [employer] [facility]) Community Based Palliative Care  This patient has been referred to our palliative care services in the community.ACC will continue to follow for any discharge planning needs and to coordinate admission onto palliative care.  Thank you for the opportunity to participate in this patient's care.   Gar Ponto, RN [facility] Liaison  [phone]

## 2020-05-30 NOTE — Progress Notes (Signed)
[place] PHYSICAL MEDICINE & REHABILITATION PROGRESS NOTE   Subjective/Complaints: No new issues this morning. Still having pain from sacral wound. Asked to be adjusted in bed.   ROS: Limited due to cognitive/behavioral    Objective:   No results found. No results for input(s): WBC, HGB, HCT, PLT in the last 72 hours. No results for input(s): NA, K, CL, CO2, GLUCOSE, BUN, CREATININE, CALCIUM in the last 72 hours.  Intake/Output Summary (Last 24 hours) at 05/30/2020 0953 Last data filed at 05/30/2020 0539 Gross per 24 hour  Intake 360 ml  Output 250 ml  Net 110 ml     Pressure Injury 05/14/20 Sacrum Mid Unstageable - Full thickness tissue loss in which the base of the injury is covered by slough (yellow, tan, gray, green or brown) and/or eschar (tan, brown or black) in the wound bed. previously documented as stage 2,  (Active)  05/14/20 0132  Location: Sacrum  Location Orientation: Mid  Staging: Unstageable - Full thickness tissue loss in which the base of the injury is covered by slough (yellow, tan, gray, green or brown) and/or eschar (tan, brown or black) in the wound bed.  Wound Description (Comments): previously documented as stage 2, now unsteagable with with soft black tissue covering wound base  Present on Admission: Yes    Physical Exam: Vital Signs Blood pressure 135/88, pulse 94, temperature 97.8 F (36.6 C), temperature source Oral, resp. rate 18, height 5\' 11"  (1.803 m), weight 96 kg, SpO2 99 %. Constitutional: No distress . Vital signs reviewed. HEENT: EOMI, oral membranes moist Neck: supple Cardiovascular: RRR without murmur. No JVD    Respiratory/Chest: CTA Bilaterally without wheezes or rales. Normal effort    GI/Abdomen: BS +, non-tender, non-distended Ext: no clubbing, cyanosis, or edema Psych: non-agitated but confused Skin: Warm and dry.   Sacral ulcer unchanged/dressed, tender Musc: No edema in extremities.  No tenderness in extremities. Neuro:  Alert and oriented x1 again today. Speech clear. Motor: RUE/RLE: 4/5 proximal distal Left neglect persists LUE/LLE: approx 3/5 proximal to distal. Stable  Assessment/Plan: 1. Functional deficits which require 3+ hours per day of interdisciplinary therapy in a comprehensive inpatient rehab setting.  Physiatrist is providing close team supervision and 24 hour management of active medical problems listed below.  Physiatrist and rehab team continue to assess barriers to discharge/monitor patient progress toward functional and medical goals  Care Tool:  Bathing    Body parts bathed by patient: Right arm,Left arm,Chest,Abdomen,Face   Body parts bathed by helper: Front perineal area,Buttocks,Right upper leg,Left upper leg,Right lower leg,Left lower leg     Bathing assist Assist Level: Maximal Assistance - Patient 24 - 49%     Upper Body Dressing/Undressing Upper body dressing   What is the patient wearing?: Pull over shirt    Upper body assist Assist Level: Maximal Assistance - Patient 25 - 49%    Lower Body Dressing/Undressing Lower body dressing      What is the patient wearing?: Incontinence brief     Lower body assist Assist for lower body dressing: Dependent - Patient 0%     Toileting Toileting Toileting Activity did not occur (Clothing management and hygiene only): N/A (no void or bm)  Toileting assist Assist for toileting: 2 Helpers     Transfers Chair/bed transfer  Transfers assist  Chair/bed transfer activity did not occur: Safety/medical concerns (decreased strength/activity tolerance)  Chair/bed transfer assist level: Dependent - mechanical lift     Locomotion Ambulation   Ambulation assist  Ambulation activity did not occur: Safety/medical concerns (decreased strength/activity tolerance)          Walk 10 feet activity   Assist  Walk 10 feet activity did not occur: Safety/medical concerns        Walk 50 feet activity   Assist Walk 50  feet with 2 turns activity did not occur: Safety/medical concerns         Walk 150 feet activity   Assist Walk 150 feet activity did not occur: Safety/medical concerns         Walk 10 feet on uneven surface  activity   Assist Walk 10 feet on uneven surfaces activity did not occur: Safety/medical concerns         Wheelchair     Assist Will patient use wheelchair at discharge?: Yes (Per PT long term goals)   Wheelchair activity did not occur: Safety/medical concerns (decreased strength/activity tolerance to transfer to w/c on eval)         Wheelchair 50 feet with 2 turns activity    Assist    Wheelchair 50 feet with 2 turns activity did not occur: Safety/medical concerns       Wheelchair 150 feet activity     Assist  Wheelchair 150 feet activity did not occur: Safety/medical concerns       Blood pressure 135/88, pulse 94, temperature 97.8 F (36.6 C), temperature source Oral, resp. rate 18, height 5\' 11"  (1.803 m), weight 96 kg, SpO2 99 %.  Medical Problem List and Plan: 1.Functional and mobility deficitssecondary to debility after recurrent seizures and associated respiratory failure. Resolving encephalopathy.  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  2. Antithrombotics: -DVT/anticoagulation:Pharmaceutical:Heparin.  -antiplatelet therapy: N/a 3. Pain Management:Tylenol prn 4. Mood/agitation: -antipsychotic agents: Continue low dose seroquel to help with sundowning, 25mg  qhs   -limit benzos  -continue pyridoxine    5. Neuropsych: This patientis not fullycapable of making decisions onhisown behalf. 6. Skin/Wound Care:Routine pressure relief measures, turning in bed  -continue foam dressing to sacrum -prevalon boots for bilateral  heels -encourage adequate PO intake 7. Fluids/Electrolytes/Nutrition:encourage PO  -low protein state   -offering protein supps   -BUN trending up a bit but still WNL, push fluids 8. HTN: Monitor BP tid--continue Norvasc and metoprolol  Controlled on 2/6 9. Steroid induced hyperglycemia: Monitor BS ac/hs and use SSI for elevated BS.  amaryl 1mg  daily 1/25, increased to 2 mg on 2/3  2/10 cbg's improving with 4mg  amaryl---continue 10. GBM/Seizures: Has been seizure free on Keppra 2000 mg bid.  -continue decadron 4mg  bid as above  -see #5  No breakthrough seizures   12. Hypoxia: Currently on 2 L per [place]   Continue supplemental oxygen per [place].wean as possible May need to go home on O2 Encourage IS, deep breathing, OOB 13.  Acute lower UTI  10 days amoxil completed 14.  Dysphagia  D3 thins, advance diet as tolerated 15. Thrombocytopenia: 122k 2/7 near baseline    LOS: 17 days A FACE TO FACE EVALUATION WAS PERFORMED  Meredith Staggers 05/30/2020, 9:53 AM

## 2020-05-30 NOTE — Progress Notes (Signed)
Physical Therapy Session Note  Patient Details  Name: [name] MRN: [id number] Date of Birth: 17-Feb-1946  Today's Date: 05/31/2020 PT Individual Time: 1105-1205 PT Individual Time Calculation (min): 60 min   Short Term Goals: Week 3:  PT Short Term Goal 1 (Week 3): Patient's family will initiate hoyer lift training. PT Short Term Goal 2 (Week 3): Patient will sit OOB for 1 hour at least 2/7 days this week. PT Short Term Goal 3 (Week 3): Patient's family will initiate training for bed mobility and positioning.  Skilled Therapeutic Interventions/Progress Updates:     Patient in bed asleep upon PT arrival. Patient easily aroused to verbal stimulation and agreeable to PT session. Patient denied pain during session, however reported discomfort to cold throughout session.   Played music by Merry Proud throughout session for improved arousal and patient participation.  Therapeutic Activity: Bed Mobility: Donned pants with total A bed level prior to mobility. Patient performed rolling R/L with mod A with hand-over-hand assist for use of bed rails. He performed supine to/from sit with mod-max A for trunk and lower extremity managment. Provided verbal cues for sequencing and initiation with transition through L side-lying for increased upper extremity leverage for trunk control. Performed scooting up in bed with max A +2 at end of session, patient cued to bend his knees and push through his legs to assist with scooting up. Transfers: Patient performed sit to/from stand x2 using Clarise Cruz Plus for improved boosting-up. Tolerated standing 20-30 seconds each trial. Provided verbal cues for initiation and patient to complete stand out of sling each time for improved lower extremity strengthening and balance. Patient denied transferring to the TIS w/c, becoming agitated when asked more than once, but easily redirected with offers to return to lying.   Neuromuscular Re-ed: Patient performed the following  activities: -reciprocal scooting x4 with max A for facilitation of head-hips relationship and forward progression -sitting balance >15 min focused on midline orientation due to L posterior lean initially with B upper extremity support with mod A for balance, progressed to supervision-min A in midline without upper extremity support, donned shirt with max A as task to encourage decreased upper extremity support due to patient's fear of falling, patient's wife provided distraction and encouragement for patient to remain in sitting throughout  Patient in bed at end of session with breaks locked, bed alarm set, and all needs within reach.    Therapy Documentation Precautions:  Precautions Precautions: Fall Precaution Comments: Peripheral visual field cut L>R, seizures Restrictions Weight Bearing Restrictions: No Other Position/Activity Restrictions: on 2-4 L/min O2   Therapy/Group: Individual Therapy  Jahyra Sukup L Sinclaire Artiga PT, DPT  05/31/2020, 12:10 AM

## 2020-05-30 NOTE — Progress Notes (Signed)
Speech Language Pathology Daily Session Note  Patient Details  Name: NAMEER SUMMER MRN: [id number] Date of Birth: March 17, 1946  Today's Date: 05/30/2020 SLP Individual Time: 3845-3646 SLP Individual Time Calculation (min): 40 min  Short Term Goals: Week 3: SLP Short Term Goal 1 (Week 3): Pt will sustain attention to functional tasks for 10 minute intervals with Max A cues for redirection. SLP Short Term Goal 2 (Week 3): Pt will orient to date, place, and situation with Mod A cues for use of external aids or other strategies. SLP Short Term Goal 3 (Week 3): Pt will identify 2 cognitive and 2 physical impairment in setting of acute hospitalization with Mod A multimodal cues. SLP Short Term Goal 4 (Week 3): Pt will use strategies for speech fluency/reducing verbal repetitions with Min A multimodal cues. SLP Short Term Goal 5 (Week 3): Pt will consume dys 3 textures and nectar-thick liquids with min cues for use of swallowing precautions and minimal overt s/s of aspiration. SLP Short Term Goal 6 (Week 3): Patient will demonstrate functional problem solving for basic and familiar tasks with Mod A multimodal cues.  Skilled Therapeutic Interventions: Skilled treatment session focused on cognitive goals. Upon arrival, patient was awake in bed. Patient did not recall that he had eaten breakfast earlier in the day. SLP facilitated session by repositioning patient to maximize arousal and safety with PO intake. Patient consumed 12 oz of nectar-thick liquids via straw without overt s/s of aspiration but required Max encouragement and hand over hand assist for self-feeding. Patient also consumed several bites of yogurt. Patient requested to use the bathroom but required Max A multimodal cues for problem solving while attempting to use the bedpan as patient kept his right hand inside the bedpan and attempted to pull the bedpan out before using. SLP had to eventually remove the bedpan and patient was unable to have  a bowel movement. Throughout the process, the patient became mildly frustrated but was easily redirected. Patient left upright in bed with alarm on and all needs within reach. Continue with current plan of care.      Pain No/Denies Pain   Therapy/Group: Individual Therapy  Angelo Caroll 05/30/2020, 12:27 PM

## 2020-05-31 LAB — GLUCOSE, CAPILLARY
Glucose-Capillary: 198 mg/dL — ABNORMAL HIGH (ref 70–99)
Glucose-Capillary: 184 mg/dL — ABNORMAL HIGH (ref 70–99)
Glucose-Capillary: 146 mg/dL — ABNORMAL HIGH (ref 70–99)
Glucose-Capillary: 161 mg/dL — ABNORMAL HIGH (ref 70–99)

## 2020-05-31 NOTE — Progress Notes (Signed)
   Palliative Medicine Inpatient Follow Up Note  Consulted to help arrange home hospice. Patient is already consulted by [facility] collective for OP Palliative support. I have requested that Farrel Gordon speak to the family for arrangement of home hospice care.   I spoke to Dr. Naaman Plummer via secure chat - he informed me that we do not need to complete a formal consult given the above information.  We will sign off but please do not hesitate to contact the service if additional support is needed.  No Charge ______________________________________________________________________________________ [place] Team Team Cell Phone: [phone] Please utilize secure chat with additional questions, if there is no response within 30 minutes please call the above phone number  Palliative Medicine Team providers are available by phone from 7am to 7pm daily and can be reached through the team cell phone.  Should this patient require assistance outside of these hours, please call the patient's attending physician.

## 2020-05-31 NOTE — Progress Notes (Signed)
Speech Language Pathology Daily Session Note  Patient Details  Name: [name] MRN: [id number] Date of Birth: Feb 01, 1946  Today's Date: 05/31/2020 SLP Individual Time: 6004-5997 SLP Individual Time Calculation (min): 35 min  Missed Time: 10 minutes, fatigue   Short Term Goals: Week 3: SLP Short Term Goal 1 (Week 3): Pt will sustain attention to functional tasks for 10 minute intervals with Max A cues for redirection. SLP Short Term Goal 2 (Week 3): Pt will orient to date, place, and situation with Mod A cues for use of external aids or other strategies. SLP Short Term Goal 3 (Week 3): Pt will identify 2 cognitive and 2 physical impairment in setting of acute hospitalization with Mod A multimodal cues. SLP Short Term Goal 4 (Week 3): Pt will use strategies for speech fluency/reducing verbal repetitions with Min A multimodal cues. SLP Short Term Goal 5 (Week 3): Pt will consume dys 3 textures and nectar-thick liquids with min cues for use of swallowing precautions and minimal overt s/s of aspiration. SLP Short Term Goal 6 (Week 3): Patient will demonstrate functional problem solving for basic and familiar tasks with Mod A multimodal cues.  Skilled Therapeutic Interventions: Skilled treatment session focused on cognitive and dysphagia goals. SLP facilitated session by providing Max A verbal cues for sustained attention to self-feeding due to discomfort from his sacral wound. Patient constantly attempting to pull his dressing off his sacrum wound and SLP placed a stress ball in his hand to minimize attempts. Patient was repositioned in bed and self-fed 4 oz of applesauce with Mod verbal and tactile cues. Patient also initially noted to be scraping his tongue with the spoon but after several bites, began to utilize the spoon correctly. No overt s/s of aspiration noted. Patient continues to demonstrate language of confusion and asking clinician to move the chair out despite being in the bed.  Eventually, patient became lethargic and was falling asleep, therefore, patient missed remaining 10 minutes. Patient left supine in bed with alarm on and all needs within reach. Continue with current plan of care.      Pain Pain Assessment Faces Pain Scale: Hurts little more Pain Type: Chronic pain Pain Location: Generalized Pain Descriptors / Indicators: Grimacing;Guarding;Moaning Pain Onset: With Activity Pain Intervention(s): Repositioned;Rest  Therapy/Group: Individual Therapy  Verl Whitmore 05/31/2020, 12:29 PM

## 2020-05-31 NOTE — Progress Notes (Signed)
Physical Therapy Session Note  Patient Details  Name: [name] MRN: [id number] Date of Birth: 1945-07-24  Today's Date: 05/31/2020 PT Individual Time: 1030-1100 PT Individual Time Calculation (min): 30 min   Short Term Goals: Week 2:  PT Short Term Goal 1 (Week 3): Patient's family will initiate hoyer lift training. PT Short Term Goal 2 (Week 3): Patient will sit OOB for 1 hour at least 2/7 days this week. PT Short Term Goal 3 (Week 3): Patient's family will initiate training for bed mobility and positioning.  Skilled Therapeutic Interventions/Progress Updates:    Patient in supine and noted brief soiled, pt requesting help to change.  Patient positioned on L side.  Cues to pull to rail as PT assisted to doff brief and assist with hygiene.  Patient rolled to supine with max A and pt moaning c/o pain.  Elevated HOB slightly for comfort, but encouraged to stretch straight in supine.  Completed donning brief, then worked on donning pants.  Patient cued to lift legs alternately as threading legs into pants.  Unable to lift L with increased time and mod cues.  Patient assisted with lifting R leg and pants donned with pt rolling to L side with max cues, increased time and mod A.  Patient assisted to don socks.  Re-oriented to call bell and to ask for help to get cleaned up if has a BM.  Therapy Documentation Precautions:  Precautions Precautions: Fall Precaution Comments: Peripheral visual field cut L>R, seizures Restrictions Weight Bearing Restrictions: No Other Position/Activity Restrictions: on 2-4 L/min O2 Pain: Pain Assessment Faces Pain Scale: Hurts little more Pain Type: Chronic pain Pain Location: Generalized Pain Descriptors / Indicators: Grimacing;Guarding;Moaning Pain Onset: With Activity Pain Intervention(s): Repositioned;Rest   Therapy/Group: Individual Therapy  Reginia Naas  Magda Kiel, PT 05/31/2020, 12:27 PM

## 2020-05-31 NOTE — Progress Notes (Signed)
PHYSICAL MEDICINE & REHABILITATION PROGRESS NOTE   Subjective/Complaints: Pt up in bed this am. Appears to be comfortable. Still having tailbone pain at times  ROS: Limited due to cognitive/behavioral    Objective:   No results found. No results for input(s): WBC, HGB, HCT, PLT in the last 72 hours. No results for input(s): NA, K, CL, CO2, GLUCOSE, BUN, CREATININE, CALCIUM in the last 72 hours.  Intake/Output Summary (Last 24 hours) at 05/31/2020 1019 Last data filed at 05/30/2020 1700 Gross per 24 hour  Intake 220 ml  Output --  Net 220 ml     Pressure Injury 05/14/20 Sacrum Mid Unstageable - Full thickness tissue loss in which the base of the injury is covered by slough (yellow, tan, gray, green or brown) and/or eschar (tan, brown or black) in the wound bed. previously documented as stage 2,  (Active)  05/14/20 0132  Location: Sacrum  Location Orientation: Mid  Staging: Unstageable - Full thickness tissue loss in which the base of the injury is covered by slough (yellow, tan, gray, green or brown) and/or eschar (tan, brown or black) in the wound bed.  Wound Description (Comments): previously documented as stage 2, now unsteagable with with soft black tissue covering wound base  Present on Admission: Yes    Physical Exam: Vital Signs Blood pressure (!) 142/98, pulse 92, temperature 98.1 F (36.7 C), temperature source Oral, resp. rate 18, height 5\' 11"  (1.803 m), weight 96 kg, SpO2 100 %. Constitutional: No distress . Vital signs reviewed. HEENT: EOMI, oral membranes moist Neck: supple Cardiovascular: RRR without murmur. No JVD    Respiratory/Chest: CTA Bilaterally without wheezes or rales. Normal effort    GI/Abdomen: BS +, non-tender, non-distended Ext: no clubbing, cyanosis, or edema Psych: pleasantly confused today Skin: Warm and dry.   Sacral ulcer dressed, area tender Musc: No edema in extremities.  No tenderness in extremities. Neuro: Alert and  oriented x1 again today. Speech clear. Motor: RUE/RLE: 4/5 proximal distal Left neglect persists LUE/LLE: approx 3/5 proximal to distal. Stable  Assessment/Plan: 1. Functional deficits which require 3+ hours per day of interdisciplinary therapy in a comprehensive inpatient rehab setting.  Physiatrist is providing close team supervision and 24 hour management of active medical problems listed below.  Physiatrist and rehab team continue to assess barriers to discharge/monitor patient progress toward functional and medical goals  Care Tool:  Bathing    Body parts bathed by patient: Right arm,Left arm,Chest,Abdomen,Face   Body parts bathed by helper: Front perineal area,Buttocks,Right upper leg,Left upper leg,Right lower leg,Left lower leg     Bathing assist Assist Level: Maximal Assistance - Patient 24 - 49%     Upper Body Dressing/Undressing Upper body dressing   What is the patient wearing?: Pull over shirt    Upper body assist Assist Level: Maximal Assistance - Patient 25 - 49%    Lower Body Dressing/Undressing Lower body dressing      What is the patient wearing?: Incontinence brief     Lower body assist Assist for lower body dressing: Dependent - Patient 0%     Toileting Toileting Toileting Activity did not occur (Clothing management and hygiene only): N/A (no void or bm)  Toileting assist Assist for toileting: 2 Helpers     Transfers Chair/bed transfer  Transfers assist  Chair/bed transfer activity did not occur: Safety/medical concerns (decreased strength/activity tolerance)  Chair/bed transfer assist level: Dependent - mechanical lift     Locomotion Ambulation   Ambulation assist   Ambulation  activity did not occur: Safety/medical concerns (decreased strength/activity tolerance)          Walk 10 feet activity   Assist  Walk 10 feet activity did not occur: Safety/medical concerns        Walk 50 feet activity   Assist Walk 50 feet with  2 turns activity did not occur: Safety/medical concerns         Walk 150 feet activity   Assist Walk 150 feet activity did not occur: Safety/medical concerns         Walk 10 feet on uneven surface  activity   Assist Walk 10 feet on uneven surfaces activity did not occur: Safety/medical concerns         Wheelchair     Assist Will patient use wheelchair at discharge?: Yes (Per PT long term goals)   Wheelchair activity did not occur: Safety/medical concerns (decreased strength/activity tolerance to transfer to w/c on eval)         Wheelchair 50 feet with 2 turns activity    Assist    Wheelchair 50 feet with 2 turns activity did not occur: Safety/medical concerns       Wheelchair 150 feet activity     Assist  Wheelchair 150 feet activity did not occur: Safety/medical concerns       Blood pressure (!) 142/98, pulse 92, temperature 98.1 F (36.7 C), temperature source Oral, resp. rate 18, height 5\' 11"  (1.803 m), weight 96 kg, SpO2 100 %.  Medical Problem List and Plan: 1.Functional and mobility deficitssecondary to debility after recurrent seizures and associated respiratory failure. Resolving encephalopathy.  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 I appreciate their help. 2. Antithrombotics: -DVT/anticoagulation:Pharmaceutical:Heparin.  -antiplatelet therapy: N/a 3. Pain Management:Tylenol prn  -turning pt in bed/air mattress 4. Mood/agitation: -antipsychotic agents: Continue low dose seroquel to help with sundowning, 25mg  qhs   -limit benzos  -continue pyridoxine    5. Neuropsych: This patientis not fullycapable of making decisions onhisown behalf. 6. Skin/Wound Care:Routine pressure relief measures, turning in bed  -continue foam dressing to sacrum -prevalon boots for bilateral heels -encourage  adequate PO intake 7. Fluids/Electrolytes/Nutrition:encourage PO  -low protein state   -offering protein supps   -BUN up sl on last check but still WNL, continue to push fluids 8. HTN: Monitor BP tid--continue Norvasc and metoprolol  Controlled on 2/6 9. Steroid induced hyperglycemia: Monitor BS ac/hs and use SSI for elevated BS.  amaryl 1mg  daily 1/25, increased to 2 mg on 2/3  2/10 cbg's improving with 4mg  amaryl---continue 10. GBM/Seizures: Has been seizure free on Keppra 2000 mg bid.  -continue decadron 4mg  bid as above  -see #5  No breakthrough seizures   12. Hypoxia: Currently on 2 L per [place]   Continue supplemental oxygen per [place].wean as possible May need to go home on O2 Encourage IS, deep breathing, OOB 13.  Acute lower UTI  10 days amoxil completed 14.  Dysphagia  D3 thins, advance diet as tolerated 15. Thrombocytopenia: 122k 2/7 near baseline    LOS: 18 days A FACE TO FACE EVALUATION WAS PERFORMED  Meredith Staggers 05/31/2020, 10:19 AM

## 2020-05-31 NOTE — Progress Notes (Signed)
Occupational Therapy Session Note  Patient Details  Name: [name] MRN: [id number] Date of Birth: 04/16/1946  Today's Date: 05/31/2020 OT Individual Time: 2409-7353 OT Individual Time Calculation (min): 56 min    Short Term Goals: Week 1:  OT Short Term Goal 1 (Week 1): Pt will maintain EOB sitting balance with mod A OT Short Term Goal 1 - Progress (Week 1): Met OT Short Term Goal 2 (Week 1): Pt will set up grooming tasks with supervision to demo improved motor planning OT Short Term Goal 2 - Progress (Week 1): Met OT Short Term Goal 3 (Week 1): Pt will don shirt with mod A to demo improved sequencing OT Short Term Goal 3 - Progress (Week 1): Met OT Short Term Goal 4 (Week 1): Pt will require no more than min cueing to attend to dressing task OT Short Term Goal 4 - Progress (Week 1): Met  Skilled Therapeutic Interventions/Progress Updates:    1;1. Pt received in bed agreeable to OT. Pt with NT present beginning breakfast. OT took over facilitating self feeding working on visual perception, L attention, functional reach and initiation. Pt uses BUE to don glasses with HOH A ot initiate and put glasses over L ear. Pt initially requires HOH A to start hand to mouth excursion after loading fork fading to [facility] A to pierce to food d/t visual and attention deficits. Pt then able to bring food to mouth without A. Pt requires repositioning in the middle of self feeding d/t leaning L in bed with HOB elevated to 45*. Eventually pt able to take a few bite of food without initiation cues or HOH A. Scanning to midline ot locate utensil and L of plate to locate desired food item. Pt given warm wash cloth for face and initially tries to bite it, but redirectable. Exited session with pt seated in bed, exit alarm on and call light in reach   Therapy Documentation Precautions:  Precautions Precautions: Fall Precaution Comments: Peripheral visual field cut L>R, seizures Restrictions Weight Bearing  Restrictions: No Other Position/Activity Restrictions: on 2-4 L/min O2 General:   Vital Signs: Therapy Vitals Temp: 98.1 F (36.7 C) Temp Source: Oral Pulse Rate: 92 Resp: 18 BP: (!) 142/98 Patient Position (if appropriate): Lying Oxygen Therapy SpO2: 100 % O2 Device: Nasal Cannula O2 Flow Rate (L/min): 2 L/min Pain:   ADL: ADL Eating: Minimal assistance Where Assessed-Eating: Bed level Grooming: Minimal assistance,Moderate cueing Where Assessed-Grooming: Edge of bed Upper Body Bathing: Minimal assistance,Moderate cueing Where Assessed-Upper Body Bathing: Edge of bed Lower Body Bathing: Dependent,Moderate cueing Where Assessed-Lower Body Bathing: Bed level Upper Body Dressing: Maximal assistance,Moderate cueing Where Assessed-Upper Body Dressing: Edge of bed Lower Body Dressing: Dependent Where Assessed-Lower Body Dressing: Bed level Toileting: Unable to assess Toilet Transfer Method: Unable to assess Vision   Perception    Praxis   Exercises:   Other Treatments:     Therapy/Group: Individual Therapy  Tonny Branch 05/31/2020, 6:36 AM

## 2020-05-31 NOTE — Plan of Care (Signed)
  Problem: RH Attention Goal: LTG Patient will demonstrate this level of attention during functional activites (PT) Description: LTG:  Patient will demonstrate this level of attention during functional activites (PT) Flowsheets (Taken 05/31/2020 0729) LTG: Patient will demonstrate attention during functional mobility with assistance of: (downgraded goal due to slow progress) Minimal Assistance - Patient > 75% Note: downgraded goal due to slow progress   Problem: RH Awareness Goal: LTG: Patient will demonstrate awareness during functional activites type of (PT) Description: LTG: Patient will demonstrate awareness during functional activites type of (PT) Flowsheets (Taken 05/31/2020 0729) LTG: Patient will demonstrate awareness during functional activites type of (PT): (downgraded goal due to slow progress) Minimal Assistance - Patient > 75% Note: downgraded goal due to slow progress   Problem: RH Pre-functional/Other (Specify) Goal: RH LTG PT (Specify) 1 Description:  RH LTG PT (Specify) 1 Flowsheets (Taken 05/31/2020 0735) LTG: Other PT (Specify) 1: Patient's family will demonstrate independence with dependent patient transfers using hoyer lift. Goal: RH LTG PT (Specify) 2 Description: RH LTG PT (Specify) 2 Flowsheets (Taken 05/31/2020 0735) LTG: Other PT (Specify) 2: Patient's caregivers with be independent with PROM and positioning patient in the bed for pressure relief.   Problem: RH Balance Goal: LTG Patient will maintain dynamic standing balance (PT) Description: LTG:  Patient will maintain dynamic standing balance with assistance during mobility activities (PT) Outcome: Not Progressing Flowsheets (Taken 05/31/2020 0729) LTG: Pt will maintain dynamic standing balance during mobility activities with:: (d/c goal due to limited progress with mobility.) -- Note: d/c goal due to limited progress with mobility.   Problem: Sit to Stand Goal: LTG:  Patient will perform sit to stand with  assistance level (PT) Description: LTG:  Patient will perform sit to stand with assistance level (PT) Outcome: Not Progressing Flowsheets (Taken 05/31/2020 0729) LTG: PT will perform sit to stand in preparation for functional mobility with assistance level: (d/c goal due to limited progress with mobility.) -- Note: d/c goal due to limited progress with mobility.   Problem: RH Bed to Chair Transfers Goal: LTG Patient will perform bed/chair transfers w/assist (PT) Description: LTG: Patient will perform bed to chair transfers with assistance (PT). Outcome: Not Progressing Flowsheets (Taken 05/31/2020 0729) LTG: Pt will perform Bed to Chair Transfers with assistance level: (d/c goal due to limited progress with mobility.) -- Note: d/c goal due to limited progress with mobility.   Problem: RH Car Transfers Goal: LTG Patient will perform car transfers with assist (PT) Description: LTG: Patient will perform car transfers with assistance (PT). Outcome: Not Progressing Flowsheets (Taken 05/31/2020 0729) LTG: Pt will perform car transfers with assist:: (d/c goal due to limited progress with mobility.) -- Note: d/c goal due to limited progress with mobility.   Problem: RH Ambulation Goal: LTG Patient will ambulate in controlled environment (PT) Description: LTG: Patient will ambulate in a controlled environment, # of feet with assistance (PT). Outcome: Not Progressing Flowsheets (Taken 05/31/2020 0729) LTG: Pt will ambulate in controlled environ  assist needed:: (d/c goal due to limited progress with mobility.) -- Note: d/c goal due to limited progress with mobility. Goal: LTG Patient will ambulate in home environment (PT) Description: LTG: Patient will ambulate in home environment, # of feet with assistance (PT). Outcome: Not Progressing Flowsheets (Taken 05/31/2020 0729) LTG: Pt will ambulate in home environ  assist needed:: (d/c goal due to limited progress with mobility.) -- Note: d/c goal due  to limited progress with mobility.

## 2020-06-01 LAB — GLUCOSE, CAPILLARY
Glucose-Capillary: 226 mg/dL — ABNORMAL HIGH (ref 70–99)
Glucose-Capillary: 168 mg/dL — ABNORMAL HIGH (ref 70–99)
Glucose-Capillary: 184 mg/dL — ABNORMAL HIGH (ref 70–99)
Glucose-Capillary: 135 mg/dL — ABNORMAL HIGH (ref 70–99)

## 2020-06-01 MED ORDER — OXYCODONE HCL 5 MG PO TABS
5.0000 mg | ORAL_TABLET | Freq: Four times a day (QID) | ORAL | Status: DC | PRN
  Administered 2020-06-01 – 2020-06-10 (×6): 5 mg via ORAL
  Filled 2020-06-01 (×7): qty 1

## 2020-06-01 NOTE — Progress Notes (Signed)
Manufacturing engineer [facility])  Hospital liaison spent time with Mrs. Lavigne on Friday to discuss hospice services at home.   Mrs. Pucci wanted time to discuss with her children before making a decision.   Per discussion, plan to d/c on 2/22.   [facility] will follow peripherally for hospice needs at discharge.  Venia Carbon RN, BSN, [place] [facility] Liaison

## 2020-06-01 NOTE — Progress Notes (Signed)
Speech Language Pathology Daily Session Note  Patient Details  Name: [name] MRN: [id number] Date of Birth: [birth year]  Today's Date: 06/01/2020 SLP Individual Time: 7026-3785 SLP Individual Time Calculation (min): 38 min  Short Term Goals: Week 3: SLP Short Term Goal 1 (Week 3): Pt will sustain attention to functional tasks for 10 minute intervals with Max A cues for redirection. SLP Short Term Goal 2 (Week 3): Pt will orient to date, place, and situation with Mod A cues for use of external aids or other strategies. SLP Short Term Goal 3 (Week 3): Pt will identify 2 cognitive and 2 physical impairment in setting of acute hospitalization with Mod A multimodal cues. SLP Short Term Goal 4 (Week 3): Pt will use strategies for speech fluency/reducing verbal repetitions with Min A multimodal cues. SLP Short Term Goal 5 (Week 3): Pt will consume dys 3 textures and nectar-thick liquids with min cues for use of swallowing precautions and minimal overt s/s of aspiration. SLP Short Term Goal 6 (Week 3): Patient will demonstrate functional problem solving for basic and familiar tasks with Mod A multimodal cues.  Skilled Therapeutic Interventions: Pt seen for skilled ST with focus on cognitive and dysphagia goals. SLP facilitated session by providing min progressing to mod verbal cues for sustained attention for self feeding d/t fidgeting and agitating with brief and wrist band. Pt consuming 4 oz NTL with no overt s/s aspiration, oral and pharyngeal phase of swallow appear timely and functional. Pt offered trials of ice chips but declined. Pt stating "I don't even know why I'm here, let's go", benefiting from gentle orientation task to increase awareness of hospitalization and current cognitive and physical impairments. Pt and SLP discussing likes/dislikes with focus on simple problem solving on how to adapt current environment to maximize comfort and participation with therapies. Pt left in bed with  HOB raised 45 degrees, alarm set and all needs met and within reach. Cont ST POC.   Pain Pain Assessment Pain Scale: 0-10 Pain Score: 5  Faces Pain Scale: No hurt Pain Location: Buttocks Pain Intervention(s): Repositioned  Therapy/Group: Individual Therapy  Dewaine Conger 06/01/2020, 10:34 AM

## 2020-06-01 NOTE — Progress Notes (Signed)
[facility] PHYSICAL MEDICINE & REHABILITATION PROGRESS NOTE   Subjective/Complaints: Intermittently agitated. Likely driven by his sacral pain. Pt  C/o generalized pain but main spot is his sacrum.  ROS: Limited due to cognitive/behavioral    Objective:   No results found. No results for input(s): WBC, HGB, HCT, PLT in the last 72 hours. No results for input(s): NA, K, CL, CO2, GLUCOSE, BUN, CREATININE, CALCIUM in the last 72 hours.  Intake/Output Summary (Last 24 hours) at 06/01/2020 1133 Last data filed at 06/01/2020 0840 Gross per 24 hour  Intake 338 ml  Output --  Net 338 ml     Pressure Injury 05/14/20 Sacrum Mid Unstageable - Full thickness tissue loss in which the base of the injury is covered by slough (yellow, tan, gray, green or brown) and/or eschar (tan, brown or black) in the wound bed. previously documented as stage 2,  (Active)  05/14/20 0132  Location: Sacrum  Location Orientation: Mid  Staging: Unstageable - Full thickness tissue loss in which the base of the injury is covered by slough (yellow, tan, gray, green or brown) and/or eschar (tan, brown or black) in the wound bed.  Wound Description (Comments): previously documented as stage 2, now unsteagable with with soft black tissue covering wound base  Present on Admission: Yes    Physical Exam: Vital Signs Blood pressure 116/88, pulse 98, temperature 98 F (36.7 C), resp. rate 18, height 5\' 11"  (1.803 m), weight 96 kg, SpO2 100 %. Constitutional: No distress . Vital signs reviewed. HEENT: EOMI, oral membranes moist Neck: supple Cardiovascular: RRR without murmur. No JVD    Respiratory/Chest: CTA Bilaterally without wheezes or rales. Normal effort    GI/Abdomen: BS +, non-tender, non-distended Ext: no clubbing, cyanosis, or edema Psych: pleasantly confused this morning Skin: Warm and dry.   Sacral ulcer dressed, surrounding area tender Musc: No edema in extremities.  No tenderness in extremities. Neuro:  Alert and oriented x1 again today. Speech clear. Motor: RUE/RLE: 4/5 proximal distal Left neglect  LUE/LLE: approx 3/5 proximal to distal. Stable  Assessment/Plan: 1. Functional deficits which require 3+ hours per day of interdisciplinary therapy in a comprehensive inpatient rehab setting.  Physiatrist is providing close team supervision and 24 hour management of active medical problems listed below.  Physiatrist and rehab team continue to assess barriers to discharge/monitor patient progress toward functional and medical goals  Care Tool:  Bathing    Body parts bathed by patient: Right arm,Left arm,Chest,Abdomen,Face   Body parts bathed by helper: Front perineal area,Buttocks,Right upper leg,Left upper leg,Right lower leg,Left lower leg     Bathing assist Assist Level: Maximal Assistance - Patient 24 - 49%     Upper Body Dressing/Undressing Upper body dressing   What is the patient wearing?: Pull over shirt    Upper body assist Assist Level: Maximal Assistance - Patient 25 - 49%    Lower Body Dressing/Undressing Lower body dressing      What is the patient wearing?: Incontinence brief,Pants     Lower body assist Assist for lower body dressing: Dependent - Patient 0%     Toileting Toileting Toileting Activity did not occur (Clothing management and hygiene only): N/A (no void or bm)  Toileting assist Assist for toileting: Dependent - Patient 0%     Transfers Chair/bed transfer  Transfers assist  Chair/bed transfer activity did not occur: Safety/medical concerns (decreased strength/activity tolerance)  Chair/bed transfer assist level: Dependent - mechanical lift     Locomotion Ambulation   Ambulation assist  Ambulation activity did not occur: Safety/medical concerns (decreased strength/activity tolerance)          Walk 10 feet activity   Assist  Walk 10 feet activity did not occur: Safety/medical concerns        Walk 50 feet  activity   Assist Walk 50 feet with 2 turns activity did not occur: Safety/medical concerns         Walk 150 feet activity   Assist Walk 150 feet activity did not occur: Safety/medical concerns         Walk 10 feet on uneven surface  activity   Assist Walk 10 feet on uneven surfaces activity did not occur: Safety/medical concerns         Wheelchair     Assist Will patient use wheelchair at discharge?: Yes (Per PT long term goals)   Wheelchair activity did not occur: Safety/medical concerns (decreased strength/activity tolerance to transfer to w/c on eval)         Wheelchair 50 feet with 2 turns activity    Assist    Wheelchair 50 feet with 2 turns activity did not occur: Safety/medical concerns       Wheelchair 150 feet activity     Assist  Wheelchair 150 feet activity did not occur: Safety/medical concerns       Blood pressure 116/88, pulse 98, temperature 98 F (36.7 C), resp. rate 18, height 5\' 11"  (1.803 m), weight 96 kg, SpO2 100 %.  Medical Problem List and Plan: 1.Functional and mobility deficitssecondary to debility after recurrent seizures and associated respiratory failure. Resolving encephalopathy.  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 I appreciate their help. Spoke to his wife again briefly yesterday. She is working through Environmental health practitioner process with hospice. 2. Antithrombotics: -DVT/anticoagulation:Pharmaceutical:Heparin.  -antiplatelet therapy: N/a 3. Pain Management:Tylenol prn  -turning pt in bed/air mattress  -will add oxycodone for pain as it's just not controlled right now 4. Mood/agitation: -antipsychotic agents: Continue low dose seroquel to help with sundowning, 25mg  qhs   -limit benzos  -continue pyridoxine    5. Neuropsych: This patientis not fullycapable of making decisions onhisown  behalf. 6. Skin/Wound Care:Routine pressure relief measures, turning in bed  -continue foam dressing to sacrum -prevalon boots for bilateral heels -encourage PO intake 7. Fluids/Electrolytes/Nutrition:encourage PO  -low protein state   -offering protein supps   -BUN up sl on last check but still WNL, continue to push fluids 8. HTN: Monitor BP tid--continue Norvasc and metoprolol  Controlled on 2/6 9. Steroid induced hyperglycemia: Monitor BS ac/hs and use SSI for elevated BS.  amaryl 1mg  daily 1/25, increased to 2 mg on 2/3  2/12 better control with 4mg  amaryl---continue 10. GBM/Seizures: Has been seizure free on Keppra 2000 mg bid.  -continue decadron 4mg  bid as above  -see #5  No breakthrough seizures   12. Hypoxia: Currently on 2 L per [place]   Continue supplemental oxygen per [place].wean as possible May need to go home on O2 Encourage IS, deep breathing, OOB 13.  Acute lower UTI  10 days amoxil completed 14.  Dysphagia  D3 thins, advance diet as tolerated 15. Thrombocytopenia: 122k 2/7 near baseline    LOS: 19 days A FACE TO FACE EVALUATION WAS PERFORMED  Meredith Staggers 06/01/2020, 11:33 AM

## 2020-06-01 NOTE — Progress Notes (Signed)
Physical Therapy Session Note  Patient Details  Name: [name] MRN: [id number] Date of Birth: 1946-04-01  Today's Date: 06/01/2020 PT Individual Time: 0950-1015 PT Individual Time Calculation (min): 25 min   Short Term Goals: Week 3:  PT Short Term Goal 1 (Week 3): Patient's family will initiate hoyer lift training. PT Short Term Goal 2 (Week 3): Patient will sit OOB for 1 hour at least 2/7 days this week. PT Short Term Goal 3 (Week 3): Patient's family will initiate training for bed mobility and positioning.  Skilled Therapeutic Interventions/Progress Updates:   Pt received supine in bed and agreeable to PT. Pt noted to be on 2L/min O2 via wall. Supine>sit transfer with encouragement and mod assist to roll to the L and max assist to achieve sitting. Max assist initially to maintain balance and prevent posterior LOB progressing to min assist after 3 minutes. Pt reports that brief is uncomfortable performed lateral weight shift R and L to attempt to adjust. Mod-miin assist to prevent posterior/lateral LOB. Pt then reports need to stand to fix pants; partial stand from EOB with LE blocked and max assist from PT to power into standing with 1 UE supported on PT shoulder. Pt then becoming mildly agitated, requesting to return to lying in bed. Mod assist to return to supine. scooting to [facility] with max assist. Decreased agitation once in supine.  BLE AAROM for ankle DF and knee extension to improve joint mobility. Pt then reports need for BM, requesting bed, pan, but refused to roll for bed pan placement. NT made aware. Pt left in bed with all needs met.       Therapy Documentation Precautions:  Precautions Precautions: Fall Precaution Comments: Peripheral visual field cut L>R, seizures Restrictions Weight Bearing Restrictions: No Other Position/Activity Restrictions: on 2-4 L/min O2 Vital Signs: Therapy Vitals Temp: 98 F (36.7 C) Pulse Rate: 98 Resp: 18 BP: 116/88 Patient Position  (if appropriate): Lying Oxygen Therapy SpO2: 100 % O2 Device: 2L.min O2 Pain: Pain Assessment Pain Scale: Faces Faces Pain Scale: No hurt    Therapy/Group: Individual Therapy  Lorie Phenix 06/01/2020, 10:19 AM

## 2020-06-02 LAB — GLUCOSE, CAPILLARY
Glucose-Capillary: 221 mg/dL — ABNORMAL HIGH (ref 70–99)
Glucose-Capillary: 144 mg/dL — ABNORMAL HIGH (ref 70–99)
Glucose-Capillary: 110 mg/dL — ABNORMAL HIGH (ref 70–99)
Glucose-Capillary: 106 mg/dL — ABNORMAL HIGH (ref 70–99)

## 2020-06-02 NOTE — Progress Notes (Signed)
Occupational Therapy Weekly Progress Note  Patient Details  Name: [name] MRN: [id number] Date of Birth: Jul 21, 1945  Beginning of progress report period: May 24, 2020 End of progress report period: June 02, 2020  Today's Date: 06/02/2020 OT Individual Time: 1030-1108 OT Individual Time Calculation (min): 38 min    Patient has met 0 of 4 short term goals.  Pt has made little to no progress this reporting period. Ongoing discussions and decisions are being made in regards to hospice services at home for d/c. Goals adjusted to reflect this change and need to support comfort and quality of life.   Patient continues to demonstrate the following deficits: muscle weakness, decreased cardiorespiratoy endurance and decreased oxygen support, decreased midline orientation, decreased attention to left, decreased motor planning and ideational apraxia, decreased initiation, decreased attention, decreased awareness, decreased problem solving, decreased safety awareness, decreased memory and delayed processing and decreased sitting balance, decreased standing balance, decreased postural control, hemiplegia, decreased balance strategies and difficulty maintaining precautions and therefore will continue to benefit from skilled OT intervention to enhance overall performance with Reduce care partner burden.  Patient is not progressing toward long term goals..  Plan of care revisions: adjusted POC to reflect shift to comfort and quality of life .  OT Short Term Goals Week 2:  OT Short Term Goal 1 (Week 2): Pt will complete bed mobility supine > EOB with mod A OT Short Term Goal 1 - Progress (Week 2): Not met OT Short Term Goal 2 (Week 2): Pt will complete UB bathing with CGA seated EOB OT Short Term Goal 2 - Progress (Week 2): Not met OT Short Term Goal 3 (Week 2): Pt will demonstrate emergent awareness with mod cueing OT Short Term Goal 3 - Progress (Week 2): Not met OT Short Term Goal 4 (Week  2): Pt will complete UB dressing with min A OT Short Term Goal 4 - Progress (Week 2): Not met Week 3:  OT Short Term Goal 1 (Week 3): STG=LTGs d/t home with hospice d/c  Skilled Therapeutic Interventions/Progress Updates:     Pt quite lethargic this session with little participation in OT session. Pt Spo2 94% on RA. Pt's brief was checked to ensure hygiene, it was clean however his sacral wound dressing had come off. Consulted with RN to follow wound protocol and applied aquacel dressing and then foam sacral pad. Pt was able to assist with rolling R with max A. Pt initially stated he wanted to get OOB and then declined when offered again. Pt completed oral care with MAX A-  He was resistant to OT moving his hand from behind his head, almost catatonic in his limbs at times and zoning out. Pt became excited when OT mentioned thickening him a cup of coffee. This was done, to nectar thick consistency, and he drank 1/4 cup of coffee with total A. Pt was positioned more midline in bed and left supine with all needs within reach.   Therapy Documentation Precautions:  Precautions Precautions: Fall Precaution Comments: Peripheral visual field cut L>R, seizures Restrictions Weight Bearing Restrictions: No Other Position/Activity Restrictions: on 2-4 L/min O2   Therapy/Group: Individual Therapy  Curtis Sites 06/02/2020, 6:44 AM

## 2020-06-02 NOTE — Progress Notes (Signed)
Patient in bed moaning and crying asking for help.  Patient situated on his left side.  Patient states, "I'm hurting and please do something."  Patient given pain medicine.  RN to re-assess in a hour.

## 2020-06-02 NOTE — Plan of Care (Addendum)
Goals discontinued to reflect need to focus on quality of life and caregiver management at home.  Problem: RH Balance Goal: LTG: Patient will maintain dynamic sitting balance (OT) Description: LTG:  Patient will maintain dynamic sitting balance with assistance during activities of daily living (OT) Outcome: Not Applicable   Problem: Sit to Stand Goal: LTG:  Patient will perform sit to stand in prep for activites of daily living with assistance level (OT) Description: LTG:  Patient will perform sit to stand in prep for activites of daily living with assistance level (OT) Outcome: Not Applicable   Problem: RH Eating Goal: LTG Patient will perform eating w/assist, cues/equip (OT) Description: LTG: Patient will perform eating with assist, with/without cues using equipment (OT) Flowsheets (Taken 06/02/2020 1319) LTG: Pt will perform eating with assistance level of: (downgraded 2/13) Moderate Assistance - Patient 50 - 74%   Problem: RH Grooming Goal: LTG Patient will perform grooming w/assist,cues/equip (OT) Description: LTG: Patient will perform grooming with assist, with/without cues using equipment (OT) Flowsheets (Taken 06/02/2020 1319) LTG: Pt will perform grooming with assistance level of: (2/13) Moderate Assistance - Patient 50 - 74%   Problem: RH Bathing Goal: LTG Patient will bathe all body parts with assist levels (OT) Description: LTG: Patient will bathe all body parts with assist levels (OT) Outcome: Not Applicable   Problem: RH Dressing Goal: LTG Patient will perform upper body dressing (OT) Description: LTG Patient will perform upper body dressing with assist, with/without cues (OT). Outcome: Not Applicable Goal: LTG Patient will perform lower body dressing w/assist (OT) Description: LTG: Patient will perform lower body dressing with assist, with/without cues in positioning using equipment (OT) Outcome: Not Applicable   Problem: RH Toilet Transfers Goal: LTG Patient will  perform toilet transfers w/assist (OT) Description: LTG: Patient will perform toilet transfers with assist, with/without cues using equipment (OT) Outcome: Not Applicable

## 2020-06-03 LAB — GLUCOSE, CAPILLARY
Glucose-Capillary: 167 mg/dL — ABNORMAL HIGH (ref 70–99)
Glucose-Capillary: 166 mg/dL — ABNORMAL HIGH (ref 70–99)
Glucose-Capillary: 82 mg/dL (ref 70–99)
Glucose-Capillary: 179 mg/dL — ABNORMAL HIGH (ref 70–99)

## 2020-06-03 LAB — BASIC METABOLIC PANEL
Anion gap: 11 (ref 5–15)
BUN: 18 mg/dL (ref 8–23)
CO2: 26 mmol/L (ref 22–32)
Calcium: 9.2 mg/dL (ref 8.9–10.3)
Chloride: 99 mmol/L (ref 98–111)
Creatinine, Ser: 0.59 mg/dL — ABNORMAL LOW (ref 0.61–1.24)
GFR, Estimated: >60 mL/min (ref >60)
Glucose, Bld: 173 mg/dL — ABNORMAL HIGH (ref 70–99)
Potassium: 4.4 mmol/L (ref 3.5–5.1)
Sodium: 136 mmol/L (ref 135–145)

## 2020-06-03 LAB — CBC
HCT: 33.5 % — ABNORMAL LOW (ref 39.0–52.0)
Hemoglobin: 11.4 g/dL — ABNORMAL LOW (ref 13.0–17.0)
MCH: 30.5 pg (ref 26.0–34.0)
MCHC: 34 g/dL (ref 30.0–36.0)
MCV: 89.6 fL (ref 80.0–100.0)
Platelets: 112 10*3/uL — ABNORMAL LOW (ref 150–400)
RBC: 3.74 MIL/uL — ABNORMAL LOW (ref 4.22–5.81)
RDW: 15.9 % — ABNORMAL HIGH (ref 11.5–15.5)
WBC: 4 10*3/uL (ref 4.0–10.5)
nRBC: 0 % (ref 0.0–0.2)

## 2020-06-03 NOTE — Progress Notes (Signed)
Pt noted with anxiey and restlessness noted first part of shift. Given prn trazodone with no effects, given ativan 0.5 mg po 2 hours later, full effects noted. Pt noted with grimacing and moaning during repositioning for wound care, given prn oxycodone with partial effectiveness noted. Incontinent care provided throughout night, turned and repositioned every 2 hours.

## 2020-06-03 NOTE — Progress Notes (Signed)
Patient ID: [name], male   DOB: [birth year], 75 y.o.   MRN: [id number]  SW met with pt wife in room to follow-up about decision on if she would like hospice. Wife would like to move forward with hospice. Wife called pt son [name] while in room to discuss d/c process. Prefers for pt to d/c on 2/21 due to travelling here and coordinating  taking his grandmother to dialysis. He intends to be here to help his mother clear out space in the home for the hospital bed. Wife will continue to work on arranging 24/7 care.   SW spoke with Chrisylnn/[facility] [phone]) to inform on above. Will begin working on process to see if pt will be appropriate for hospice services by time of discharge. SW updated medical team.    [name], MSW, [place] Office: [phone] Cell: [phone] Fax: [phone]

## 2020-06-03 NOTE — Progress Notes (Signed)
Patient feeling agitated and tearful today.  Patient refused medications.  Patient stated, " I don't want anymore medications.  I just want to go home."  Patient states, " I don't have pain."  Every time patient is touched he yells " Ouch".  RN asked patient during shift if he was having pain and patient denied. Patient refused any pain medication. Spouse informed that patient had refused medication today.  Spouse was ok with that decision.  RN questioned spouse on patient's code status.  RN explained that if his heart stops the staff will have to do chest compressions and possible defibrillate.  Spouse stated, "Yes do everything to keep him alive."

## 2020-06-03 NOTE — Progress Notes (Signed)
[facility])      This patient has been referred for palliative care vs hospice services after discharge. [name] has [facility] liaison contact information and will continue discussion about desired lovel of care.  [facility] will continue to follow for any discharge planning needs and to coordinate admission onto services.  Thank you for the opportunity to participate in this patient's care.     [name], BSN, RN [facility] Liaison   [phone] (24h on call)

## 2020-06-03 NOTE — Progress Notes (Signed)
Physical Therapy Session Note  Patient Details  Name: DARA CAMARGO MRN: [id number] Date of Birth: 1946-02-14  Today's Date: 06/03/2020 PT Individual Time: 1030-1100 PT Individual Time Calculation (min): 30 min   Skilled Therapeutic Interventions/Progress Updates:  Patient in bed with O2 off upon PT arrival. Patient alert and agreeable to PT session. Patient reported unrated sacral pain during session, RN made aware. PT provided repositioning, rest breaks, and distraction as pain interventions throughout session. SPO2 90% on RA at rest, placed patient on 2L/min O2 for mobility, required replacement x2 due to patient frequently pulling his [place] off. SPO2 90-96% throughout session.   Therapeutic Activity: Bed Mobility: Patient performed rolling L with max A x2, required increased assist for facilitation to follow cues with decreased motor planning this session. Provided cues and facilitation for bending his R knee and pushing through his foot, bringing his R hand to the bed rail to pull himself over on his L side. He performed supine to/from sit with max A for trunk and lower extremity management. Provided verbal cues for sequencing and initiation. Patient sat EOB x2 ~5 min each trial, performed reciprocal scooting with max-total A x4, sitting balance with max a progressing to mod-min A with poor correction for L lean despite multimodal cues to find midline. Patient impulsive and impatent each trial, asking to stand. Performed scooting up in the bed with total a +2, provided cues for use of lower extremities to assist with poor execution by patient.  Transfers: Attempted sit to stand with max A +1 x2 due to patient impulsively attempting to stand sitting EOB. Patient with poor motor planning and safety awareness each trial and terminated standing attempts. Patient sitting on very edge of the bed and required max A to return to supine for safety to prevent patient from sliding off the bed. Patient asking  to attempt a second time. PT received Clarise Cruz Plus for standing assist, however, patient continued to impatient with poor safety awareness as the platform was being placed under his feet, and again therapist had to assist the patient to supine for safety, as above.   Patient in bed on 2L/min O2 at end of session with breaks locked, bed alarm set, and all needs within reach.   Short Term Goals: Week 3:  PT Short Term Goal 1 (Week 3): Patient's family will initiate hoyer lift training. PT Short Term Goal 2 (Week 3): Patient will sit OOB for 1 hour at least 2/7 days this week. PT Short Term Goal 3 (Week 3): Patient's family will initiate training for bed mobility and positioning.   Therapy Documentation Precautions:  Precautions Precautions: Fall Precaution Comments: Peripheral visual field cut L>R, seizures Restrictions Weight Bearing Restrictions: No Other Position/Activity Restrictions: on 2-4 L/min O2   Therapy/Group: Individual Therapy  Kyndall Chaplin L Haddon Fyfe PT, DPT  06/03/2020, 5:01 PM

## 2020-06-03 NOTE — Progress Notes (Signed)
[facility] PHYSICAL MEDICINE & REHABILITATION PROGRESS NOTE   Subjective/Complaints: No complaints.  Appreciate report from Demetrios Loll- she had conversation with wife last night and wife has decided on hospice. Discussed with [facility] Labs stable 2/14  ROS: Limited due to cognitive/behavioral    Objective:   No results found. Recent Labs    06/03/20 0645  WBC 4.0  HGB 11.4*  HCT 33.5*  PLT 112*   Recent Labs    06/03/20 0527  NA 136  K 4.4  CL 99  CO2 26  GLUCOSE 173*  BUN 18  CREATININE 0.59*  CALCIUM 9.2    Intake/Output Summary (Last 24 hours) at 06/03/2020 8315 Last data filed at 06/03/2020 0700 Gross per 24 hour  Intake 240 ml  Output --  Net 240 ml     Pressure Injury 05/14/20 Sacrum Mid Unstageable - Full thickness tissue loss in which the base of the injury is covered by slough (yellow, tan, gray, green or brown) and/or eschar (tan, brown or black) in the wound bed. previously documented as stage 2,  (Active)  05/14/20 0132  Location: Sacrum  Location Orientation: Mid  Staging: Unstageable - Full thickness tissue loss in which the base of the injury is covered by slough (yellow, tan, gray, green or brown) and/or eschar (tan, brown or black) in the wound bed.  Wound Description (Comments): previously documented as stage 2, now unsteagable with with soft black tissue covering wound base  Present on Admission: Yes    Physical Exam: Vital Signs Blood pressure (!) 146/89, pulse 93, temperature 97.9 F (36.6 C), resp. rate 20, height 5\' 11"  (1.803 m), weight 96 kg, SpO2 96 %. Gen: no distress, normal appearing HEENT: oral mucosa pink and moist, NCAT Cardio: Reg rate Chest: normal effort, normal rate of breathing Abd: soft, non-distended Ext: no edema Psych: pleasant, normal affect Skin: Warm and dry.   Sacral ulcer dressed, surrounding area tender Musc: No edema in extremities.  No tenderness in extremities. Neuro: Alert and oriented x1 again today.  Speech clear. Motor: RUE/RLE: 4/5 proximal distal Left neglect  LUE/LLE: approx 3/5 proximal to distal. Stable  Assessment/Plan: 1. Functional deficits which require 3+ hours per day of interdisciplinary therapy in a comprehensive inpatient rehab setting.  Physiatrist is providing close team supervision and 24 hour management of active medical problems listed below.  Physiatrist and rehab team continue to assess barriers to discharge/monitor patient progress toward functional and medical goals  Care Tool:  Bathing    Body parts bathed by patient: Right arm,Left arm,Chest,Abdomen,Face   Body parts bathed by helper: Front perineal area,Buttocks,Right upper leg,Left upper leg,Right lower leg,Left lower leg     Bathing assist Assist Level: Maximal Assistance - Patient 24 - 49%     Upper Body Dressing/Undressing Upper body dressing   What is the patient wearing?: Pull over shirt    Upper body assist Assist Level: Maximal Assistance - Patient 25 - 49%    Lower Body Dressing/Undressing Lower body dressing      What is the patient wearing?: Incontinence brief,Pants     Lower body assist Assist for lower body dressing: Dependent - Patient 0%     Toileting Toileting Toileting Activity did not occur (Clothing management and hygiene only): N/A (no void or bm)  Toileting assist Assist for toileting: Dependent - Patient 0%     Transfers Chair/bed transfer  Transfers assist  Chair/bed transfer activity did not occur: Safety/medical concerns (decreased strength/activity tolerance)  Chair/bed transfer assist level:  Dependent - mechanical lift     Locomotion Ambulation   Ambulation assist   Ambulation activity did not occur: Safety/medical concerns (decreased strength/activity tolerance)          Walk 10 feet activity   Assist  Walk 10 feet activity did not occur: Safety/medical concerns        Walk 50 feet activity   Assist Walk 50 feet with 2 turns  activity did not occur: Safety/medical concerns         Walk 150 feet activity   Assist Walk 150 feet activity did not occur: Safety/medical concerns         Walk 10 feet on uneven surface  activity   Assist Walk 10 feet on uneven surfaces activity did not occur: Safety/medical concerns         Wheelchair     Assist Will patient use wheelchair at discharge?: Yes (Per PT long term goals)   Wheelchair activity did not occur: Safety/medical concerns (decreased strength/activity tolerance to transfer to w/c on eval)         Wheelchair 50 feet with 2 turns activity    Assist    Wheelchair 50 feet with 2 turns activity did not occur: Safety/medical concerns       Wheelchair 150 feet activity     Assist  Wheelchair 150 feet activity did not occur: Safety/medical concerns       Blood pressure (!) 146/89, pulse 93, temperature 97.9 F (36.6 C), resp. rate 20, height 5\' 11"  (1.803 m), weight 96 kg, SpO2 96 %.  Medical Problem List and Plan: 1.Functional and mobility deficitssecondary to debility after recurrent seizures and associated respiratory failure. Resolving encephalopathy.  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 I appreciate their help. Spoke to his wife again briefly yesterday. She is working through Environmental health practitioner process with hospice.  2/14: wife has decided on hospice. Discussed with RN and SW. SW will discuss plan further with wife today.  2. Antithrombotics: -DVT/anticoagulation:Pharmaceutical:Heparin.  -antiplatelet therapy: N/a 3. Pain Management:Tylenol prn  -turning pt in bed/air mattress  -will add oxycodone for pain as it's just not controlled right now 4. Mood/agitation: -antipsychotic agents: Continue low dose seroquel to help with sundowning, 25mg  qhs   -limit benzos  -continue pyridoxine 5. Neuropsych: This  patientis not fullycapable of making decisions onhisown behalf. 6. Skin/Wound Care:Routine pressure relief measures, turning in bed  -continue foam dressing to sacrum -prevalon boots for bilateral heels -encourage PO intake 7. Fluids/Electrolytes/Nutrition:encourage PO  -low protein state   -offering protein supps   -BUN up sl on last check but still WNL, continue to push fluids 8. HTN: Monitor BP tid  Controlled on 2/14, continue Norvasc and metoprolol 9. Steroid induced hyperglycemia: Monitor BS ac/hs and use SSI for elevated BS.  amaryl 1mg  daily 1/25, increased to 2 mg on 2/3  2/12 better control with 4mg  amaryl---continue 10. GBM/Seizures: Has been seizure free on Keppra 2000 mg bid.  -continue decadron 4mg  bid as above  -see #5  No breakthrough seizures   12. Hypoxia: Currently on 2 L per [place]   Continue supplemental oxygen per [place].wean as possible May need to go home on O2 Encourage IS, deep breathing, OOB 13.  Acute lower UTI  10 days amoxil completed 14.  Dysphagia  D3 thins, advance diet as tolerated 15. Thrombocytopenia: 122k 2/7 near baseline, 112 on 2/14    LOS: 21 days A  FACE TO FACE EVALUATION WAS PERFORMED  Clide Deutscher Lizzett Nobile 06/03/2020, 9:07 AM

## 2020-06-03 NOTE — Progress Notes (Signed)
Occupational Therapy Session Note  Patient Details  Name: [name] MRN: [id number] Date of Birth: 12/15/1945  Today's Date: 06/03/2020 OT Individual Time: 1405-1430 OT Individual Time Calculation (min): 25 min    Short Term Goals: Week 3:  OT Short Term Goal 1 (Week 3): STG=LTGs d/t home with hospice d/c  Skilled Therapeutic Interventions/Progress Updates:    Pt received supine with his wife present, reporting he is more agitated today. He was initially agreeable to sitting EOB, however once this was initiated pt demanded we stop, both at the trunk and legs. Pt returned to supine and was agreeable to have a snack. HOB was elevated to 45 degrees. Pt was able to self feed a whole cup of applesauce with min cueing for attention. Pt also drank a half cup of nectar thick tea. Pt reported need to use bedpan, he rolled to the R with min A. Bedpan was placed and pt rolled back to supine. Pt was immediately bothered by bed pan and he removed it. Pt was left supine with all needs met, bed alarm set.   Therapy Documentation Precautions:  Precautions Precautions: Fall Precaution Comments: Peripheral visual field cut L>R, seizures Restrictions Weight Bearing Restrictions: No Other Position/Activity Restrictions: on 2-4 L/min O2   Therapy/Group: Individual Therapy  Curtis Sites 06/03/2020, 6:23 AM

## 2020-06-03 NOTE — Progress Notes (Addendum)
Manufacturing engineer [facility])  *Addendum:  3:50pm: Received call back from Mrs. Mckoy who confirms interest in hospice services.  Contact info verified in case son would like to speak with [facility] liaison directly.  Plan made to follow later this week regarding DME needs.*   Received request from [place] for hospice services at home after discharge; per [facility] this choice confirmed with pt's spouse.  Called Mrs. Leite, left voicemail. [facility] liaison team will follow up tomorrow.  Chart and pt information under review by [facility] physician.  Hospice eligibility pending at this time.  Thank you for the opportunity to participate in this pt's care.  [name], BSN, RN [employer] [phone] (24h on call)

## 2020-06-04 LAB — GLUCOSE, CAPILLARY
Glucose-Capillary: 279 mg/dL — ABNORMAL HIGH (ref 70–99)
Glucose-Capillary: 171 mg/dL — ABNORMAL HIGH (ref 70–99)
Glucose-Capillary: 175 mg/dL — ABNORMAL HIGH (ref 70–99)
Glucose-Capillary: 130 mg/dL — ABNORMAL HIGH (ref 70–99)

## 2020-06-04 MED ORDER — CHLORHEXIDINE GLUCONATE CLOTH 2 % EX PADS: 6.0000 | MEDICATED_PAD | Freq: Two times a day (BID) | CUTANEOUS | Status: DC

## 2020-06-04 NOTE — Patient Care Conference (Signed)
Inpatient RehabilitationTeam Conference and Plan of Care Update Date: 06/04/2020   Time: 10:16 AM    Patient Name: [name]      Medical Record Number: [id number]  Date of Birth: [birth year] Sex: Male         Room/Bed: 4W11C/4W11C-01 Payor Info: Payor: MEDICARE / Plan: MEDICARE PART A AND B / Product Type: *No Product type* /    Admit Date/Time:  05/13/2020  3:12 PM  Primary Diagnosis:  Physical debility  Hospital Problems: Principal Problem:   Physical debility Active Problems:   Dysphagia   Acute lower UTI   Supplemental oxygen dependent   Steroid-induced hyperglycemia   Benign essential HTN   Seizures [facility])    Expected Discharge Date: Expected Discharge Date: 06/10/20  Team Members Present: Physician leading conference: Dr. Alger Simons Care Coodinator Present: [name], LCSWA; Creig Hines, RN, BSN, [place] Nurse Present: Annita Brod, LPN PT Present: Apolinar Junes, PT OT Present: Laverle Hobby, OT SLP Present: Weston Anna, SLP PPS Coordinator present : Ileana Ladd, Burna Mortimer, SLP     Current Status/Progress Goal Weekly Team Focus  Bowel/Bladder   Continent w/incon episodes, LBM 06/02/2020  Less periods of incontinence  Timed toileting Q2hr and PRN   Swallow/Nutrition/ Hydration   Dys. 3 textures with nectar-thick liquids, Mod A  Mod A  hospice set up, comfort   ADL's   No functional improvement, pt much worse in terms of OOB tolerance, agitation. Rolling can still be mod A when motivated  OT POC now focused on caregiver education and quality of life  Family education and d/c planning   Mobility   max A of 1-2 bed mobility, total A sit to stand using Clarise Cruz Plus, not tolerating OOB mobility or sitting OOB this week  Patient family goals for hoyer lift training and PROM/AAROM  Family education/training, improved sitting tolerance and bed mobility, motor planning, d/c planning   Communication             Safety/Cognition/ Behavioral  Observations  Max A  Mod A  attention, initiation, basic problem solving   Pain   Complained of generalized pain  Pain score <3/10  Assess pain Qshift and PRN   Skin   Unstageable to sacrum  Prevent further skin breakdown, turn and reposition pt q 2hours  Assess Qshift and PRN     Discharge Planning:  D/c to home with wife who will provide 24/7care; pursuing hospice for d/c. She is working on additional natural supports. One of their adult son's lives locally and will assist PRN. Family edu 2/20 10am-3pm.   Team Discussion: In Hospice mode, not progressing. ? Code status. Continent B/B with incontinent episodes. Very confused, no complaints of pain. Unstageable to sacrum, appropriate to just cover with foam. Son coming in from [place] on Sunday. D/C on Monday. Patient on target to meet rehab goals: Increasing worse OOB tolerance, continuing with caregiver education and main focus. EOB max assist +1, unsafe, impulsive. Can't tolerate sitting OOB. Dys 3, nectar thick liquids. Confused, agitated, yelling, and throwing pillows at staff.  *See Care Plan and progress notes for long and short-term goals.   Revisions to Treatment Plan:  Patient to go home on Hospice.  Teaching Needs: Continue caregiver education, skin/wound care, bed mobility training.  Current Barriers to Discharge: Medical stability, Wound care and Behavior  Possible Resolutions to Barriers: Continue current medications, provide emotional support to patient and family.     Medical Summary Current Status: pain control, wound care, mood stabilization.  Barriers to Discharge: Behavior;Medical stability   Possible Resolutions to [employer] Focus: pain mgt, mazimize sleep, mood stabilization   Continued Need for Acute Rehabilitation Level of Care: The patient requires daily medical management by a physician with specialized training in physical medicine and rehabilitation for the following reasons: Direction of a  multidisciplinary physical rehabilitation program to maximize functional independence : Yes Medical management of patient stability for increased activity during participation in an intensive rehabilitation regime.: Yes Analysis of laboratory values and/or radiology reports with any subsequent need for medication adjustment and/or medical intervention. : Yes   I attest that I was present, lead the team conference, and concur with the assessment and plan of the team.   [name] 06/04/2020, 1:29 PM

## 2020-06-04 NOTE — Progress Notes (Signed)
Physical Therapy Session Note  Patient Details  Name: [name] MRN: [id number] Date of Birth: [birth year]  Today's Date: 06/04/2020 PT Individual Time: 0800-0830 PT Individual Time Calculation (min): 30 min   Short Term Goals: Week 3:  PT Short Term Goal 1 (Week 3): Patient's family will initiate hoyer lift training. PT Short Term Goal 2 (Week 3): Patient will sit OOB for 1 hour at least 2/7 days this week. PT Short Term Goal 3 (Week 3): Patient's family will initiate training for bed mobility and positioning.  Skilled Therapeutic Interventions/Progress Updates:     Patient sitting up in bed eating breakfast with NT and LPN in the orom upon PT arrival. Patient alert and agreeable to PT session. Patient intermittently crying out in pain with movement of extremities and mobilityduring session. Patient unable to identify source of pain, LPN made aware. PT provided repositioning, rest breaks, and distraction as pain interventions throughout session.   Vitals: Patient with [place] off on RA at beginning of session. SPO2 93-98% on RA, HR 116-120 at beginning of session, in 90s at end of session. Place [place] on 2L/min at end of session due to intermittent desaturation throughout the day, RN and NT made aware.   Therapeutic Activity: Bed Mobility: Patient performed rolling L with max A, scooting up in the bed with total A +2, and repositioning trunk and limbs at midline with total A of 1.  Attempted to don pants bed level, however, patient initially in agreement, then agitated with pants on his legs and asked to have them removed. Focused remainder of session on bed level exercises, see below.  Therapeutic Exercise: Patient performed the following exercises with verbal and tactile cues for proper technique. -B hip/knee flexion/extension AAROM 2x10 -B hip abd/add AAROM 2x10 -B heel cord stretch 2x30 sec  Required increased time and cues for all activities. Patient demonstrating poor motor planning  and attention throughout session. Patient perseverated on going home this morning.  Patient in bed at end of session with breaks locked, bed alarm set, and all needs within reach.    Therapy Documentation Precautions:  Precautions Precautions: Fall Precaution Comments: Peripheral visual field cut L>R, seizures Restrictions Weight Bearing Restrictions: No Other Position/Activity Restrictions: on 2-4 L/min O2   Therapy/Group: Individual Therapy  Tiannah Greenly L Jaanai Salemi PT, DPT  06/04/2020, 12:20 PM

## 2020-06-04 NOTE — Progress Notes (Signed)
Occupational Therapy Session Note  Patient Details  Name: [name] MRN: [id number] Date of Birth: [birth year]  Today's Date: 06/04/2020 OT Individual Time: 4431-5400 OT Individual Time Calculation (min): 59 min    Short Term Goals: Week 3:  OT Short Term Goal 1 (Week 3): STG=LTGs d/t home with hospice d/c  Skilled Therapeutic Interventions/Progress Updates:    Pt received supine in bed with no c/o pain at rest. He c/o pain in his sacrum throughout session with most mobility and tactile input but was able to be redirected and had better tolerance/participation overall. He was agreeable to get OOB, wife present for session and encouraging. Pt rolled R and L with mod A for hoyer pad placement. Pt unaware of incontinent BM after rolling and he required +2 dependent level care for hygiene and brief change. [place]ove was used to transfer pt from bed into TIS w/c. Several pillows used to support midline orientation (pt with L lean). Pt was taken around unit in the w/c to elevate mood and determine tolerance of staying seated for after session. Pt was very comfortable and almost falling asleep which is a good improvement from this week's intolerance of sitting. Back in the room pt completed oral care with min A for thoroughness and obvious motor planning deficits. Pt completed brief BUE strengthening circuit with a 1 lb dowel, lifting his RUE into 100 degrees of flexion without assist and requiring mod facilitation to lift the LUE. Pt was left sitting up in the TIS with his wife present. NT and LPN aware of pt position.   Therapy Documentation Precautions:  Precautions Precautions: Fall Precaution Comments: Peripheral visual field cut L>R, seizures Restrictions Weight Bearing Restrictions: No Other Position/Activity Restrictions: on 2-4 L/min O2 Therapy/Group: Individual Therapy  Curtis Sites 06/04/2020, 6:17 AM

## 2020-06-04 NOTE — Progress Notes (Signed)
Speech Language Pathology Daily Session Note  Patient Details  Name: [name] MRN: [id number] Date of Birth: 06-23-1945  Today's Date: 06/04/2020 SLP Individual Time: 8088-1103 SLP Individual Time Calculation (min): 30 min  Short Term Goals: Week 3: SLP Short Term Goal 1 (Week 3): Pt will sustain attention to functional tasks for 10 minute intervals with Max A cues for redirection. SLP Short Term Goal 2 (Week 3): Pt will orient to date, place, and situation with Mod A cues for use of external aids or other strategies. SLP Short Term Goal 3 (Week 3): Pt will identify 2 cognitive and 2 physical impairment in setting of acute hospitalization with Mod A multimodal cues. SLP Short Term Goal 4 (Week 3): Pt will use strategies for speech fluency/reducing verbal repetitions with Min A multimodal cues. SLP Short Term Goal 5 (Week 3): Pt will consume dys 3 textures and nectar-thick liquids with min cues for use of swallowing precautions and minimal overt s/s of aspiration. SLP Short Term Goal 6 (Week 3): Patient will demonstrate functional problem solving for basic and familiar tasks with Mod A multimodal cues.  Skilled Therapeutic Interventions: Skilled treatment session focused on cognitive and dysphagia goals. Upon arrival, patient was awake in bed and yelling out for help. Patient reported he needed to void but required Max A verbal and tactile cues for placing urinal. Patient was unable to void but was incontinent of bowel. Patient required total +2 assist for rolling with increased verbal agitation noted. Patient resistant to care and consistently yelled out for help despite reassurance and encouragement by SLP and NT.  Patient also required total A to donn clean gown. Patient agreeable to consuming NTL via straw. Patient consumed 2 sips without overt s/s of aspiration but declined further intake. Patient began to yell out again for help due to needing to use the bathroom, however, there is no  safe way to transfer the patient to a [facility] and patient is unable to understand the bedpan and tries to remove it. SLP ended session early due to increased agitation. Patient left supine in bed with alarm on and all needs within reach. Continue with current plan of care.      Pain Pain Assessment Pain Scale: 0-10 Pain Score: 0-No pain  Therapy/Group: Individual Therapy  Hasina Kreager 06/04/2020, 10:02 AM

## 2020-06-04 NOTE — Progress Notes (Signed)
Patient ID: [name], male   DOB: 01-04-1946, 75 y.o.   MRN: [id number]  SW met with pt wife in room to inform on working with hospice on getting pt approved. She continues to work on securing an Engineer, production. When discussing if family edu still needed, SW informed yes as the family will remain primary care. Fam edu remains 2/20 with wife and their son [name]. Will follow-up with SW if their son [name] will be present. She also stated she will confirm with there son [name] when he will be here to help clear space in the home for the hospital bed.   SW spoke with MaryAnn/[facility] [phone]) to provide updates and informing d/c date now 2/21. There will continue to be coordination between SW and [place].   [name], MSW, [place] Office: [phone] Cell: [phone] Fax: [phone]

## 2020-06-04 NOTE — Progress Notes (Signed)
[place] PHYSICAL MEDICINE & REHABILITATION PROGRESS NOTE   Subjective/Complaints: Pt with ongoing confusions/agitation/pain.   ROS: Limited due to cognitive/behavioral   Objective:   No results found. Recent Labs    06/03/20 0645  WBC 4.0  HGB 11.4*  HCT 33.5*  PLT 112*   Recent Labs    06/03/20 0527  NA 136  K 4.4  CL 99  CO2 26  GLUCOSE 173*  BUN 18  CREATININE 0.59*  CALCIUM 9.2    Intake/Output Summary (Last 24 hours) at 06/04/2020 0919 Last data filed at 06/03/2020 1859 Gross per 24 hour  Intake 180 ml  Output --  Net 180 ml     Pressure Injury 05/14/20 Sacrum Mid Unstageable - Full thickness tissue loss in which the base of the injury is covered by slough (yellow, tan, gray, green or brown) and/or eschar (tan, brown or black) in the wound bed. previously documented as stage 2,  (Active)  05/14/20 0132  Location: Sacrum  Location Orientation: Mid  Staging: Unstageable - Full thickness tissue loss in which the base of the injury is covered by slough (yellow, tan, gray, green or brown) and/or eschar (tan, brown or black) in the wound bed.  Wound Description (Comments): previously documented as stage 2, now unsteagable with with soft black tissue covering wound base  Present on Admission: Yes    Physical Exam: Vital Signs Blood pressure (!) 159/103, pulse (!) 109, temperature 98.3 F (36.8 C), resp. rate 16, height 5\' 11"  (1.803 m), weight 96 kg, SpO2 99 %. Constitutional: No distress . Vital signs reviewed. HEENT: EOMI, oral membranes moist Neck: supple Cardiovascular: RRR without murmur. No JVD    Respiratory/Chest: CTA Bilaterally without wheezes or rales. Normal effort    GI/Abdomen: BS +, non-tender, non-distended Ext: no clubbing, cyanosis, or edema Psych: confused, non-agitated at present.   Skin: Sacral ulcer with dressing, area tender Musc: No edema in extremities.  No tenderness in extremities. Neuro: Alert and oriented x1 again today.  Speech clear. Motor: RUE/RLE: 4/5 proximal distal Left neglect  LUE/LLE: approx 3/5 proximal to distal. Stable  Assessment/Plan: 1. Functional deficits which require 3+ hours per day of interdisciplinary therapy in a comprehensive inpatient rehab setting.  Physiatrist is providing close team supervision and 24 hour management of active medical problems listed below.  Physiatrist and rehab team continue to assess barriers to discharge/monitor patient progress toward functional and medical goals  Care Tool:  Bathing    Body parts bathed by patient: Right arm,Left arm,Chest,Abdomen,Face   Body parts bathed by helper: Front perineal area,Buttocks,Right upper leg,Left upper leg,Right lower leg,Left lower leg     Bathing assist Assist Level: Maximal Assistance - Patient 24 - 49%     Upper Body Dressing/Undressing Upper body dressing   What is the patient wearing?: Pull over shirt    Upper body assist Assist Level: Maximal Assistance - Patient 25 - 49%    Lower Body Dressing/Undressing Lower body dressing      What is the patient wearing?: Incontinence brief,Pants     Lower body assist Assist for lower body dressing: Dependent - Patient 0%     Toileting Toileting Toileting Activity did not occur (Clothing management and hygiene only): N/A (no void or bm)  Toileting assist Assist for toileting: Dependent - Patient 0%     Transfers Chair/bed transfer  Transfers assist  Chair/bed transfer activity did not occur: Safety/medical concerns (decreased strength/activity tolerance)  Chair/bed transfer assist level: Dependent - mechanical lift  Locomotion Ambulation   Ambulation assist   Ambulation activity did not occur: Safety/medical concerns (decreased strength/activity tolerance)          Walk 10 feet activity   Assist  Walk 10 feet activity did not occur: Safety/medical concerns        Walk 50 feet activity   Assist Walk 50 feet with 2 turns  activity did not occur: Safety/medical concerns         Walk 150 feet activity   Assist Walk 150 feet activity did not occur: Safety/medical concerns         Walk 10 feet on uneven surface  activity   Assist Walk 10 feet on uneven surfaces activity did not occur: Safety/medical concerns         Wheelchair     Assist Will patient use wheelchair at discharge?: Yes (Per PT long term goals)   Wheelchair activity did not occur: Safety/medical concerns (decreased strength/activity tolerance to transfer to w/c on eval)         Wheelchair 50 feet with 2 turns activity    Assist    Wheelchair 50 feet with 2 turns activity did not occur: Safety/medical concerns       Wheelchair 150 feet activity     Assist  Wheelchair 150 feet activity did not occur: Safety/medical concerns       Blood pressure (!) 159/103, pulse (!) 109, temperature 98.3 F (36.8 C), resp. rate 16, height 5\' 11"  (1.803 m), weight 96 kg, SpO2 99 %.  Medical Problem List and Plan: 1.Functional and mobility deficitssecondary to debility after recurrent seizures and associated respiratory failure. Resolving encephalopathy.  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 Team working on details and transition to home.  2. Antithrombotics: -DVT/anticoagulation:Pharmaceutical:Heparin.  -antiplatelet therapy: N/a 3. Pain Management:Tylenol prn  -turning pt in bed/air mattress  -continue oxycodone for severe pain 4. Mood/agitation: -antipsychotic agents: Continue low dose seroquel to help with sundowning, 25mg  qhs   -limit benzos  -continue pyridoxine 5. Neuropsych: This patientis not fullycapable of making decisions onhisown behalf. 6. Skin/Wound Care:Routine pressure relief measures, turning in bed  -continue foam dressing to sacrum -prevalon boots for bilateral  heels -encourage PO intake 7. Fluids/Electrolytes/Nutrition:encourage PO  -low protein state   -offering protein supps   -BUN up sl on last check but still WNL, continue to push fluids 8. HTN: Monitor BP tid  Controlled on 2/15, continue Norvasc and metoprolol 9. Steroid induced hyperglycemia: Monitor BS ac/hs and use SSI for elevated BS.  amaryl 1mg  daily 1/25, increased to 2 mg on 2/3  2/15 sugars are somewhat labile. Will continue amaryl at 4mg  given hospice plans 10. GBM/Seizures: Has been seizure free on Keppra 2000 mg bid.  -continue decadron 4mg  bid as above  -see #5  No breakthrough seizures   12. Hypoxia: Currently on 2 L per [place]   Continue supplemental oxygen per [email or web address].wean as possible May need to go home on O2 Encourage IS, deep breathing, OOB 13.  Acute lower UTI  10 days amoxil completed 14.  Dysphagia  D3 thins, advance diet as tolerated 15. Thrombocytopenia: 122k 2/7 near baseline, 112 on 2/14    LOS: 22 days A FACE TO [place] 06/04/2020, 9:19 AM

## 2020-06-04 NOTE — Progress Notes (Signed)
Pt tolerating room air. Pt saturating room air at 94%. No complications noted.    06/04/20 1717  Oxygen Therapy  SpO2 94 %  O2 Device Room Air    Sheela Stack, LPN

## 2020-06-05 LAB — GLUCOSE, CAPILLARY
Glucose-Capillary: 218 mg/dL — ABNORMAL HIGH (ref 70–99)
Glucose-Capillary: 126 mg/dL — ABNORMAL HIGH (ref 70–99)
Glucose-Capillary: 163 mg/dL — ABNORMAL HIGH (ref 70–99)
Glucose-Capillary: 128 mg/dL — ABNORMAL HIGH (ref 70–99)

## 2020-06-05 NOTE — Plan of Care (Signed)
  Problem: RH Problem Solving Goal: LTG Patient will demonstrate problem solving for (SLP) Description: LTG:  Patient will demonstrate problem solving for basic/complex daily situations with cues  (SLP) Outcome: Not Applicable Note: Goal no longer appropriate at this time    Problem: RH Attention Goal: LTG Patient will demonstrate this level of attention during functional activites (SLP) Description: LTG:  Patient will will demonstrate this level of attention during functional activites (SLP) Flowsheets (Taken 06/05/2020 0630) LTG: Patient will demonstrate this level of attention during cognitive/linguistic activities with assistance of (SLP): Maximal Assistance - Patient 25 - 49% Number of minutes patient will demonstrate attention during cognitive/linguistic activities: 10 Note: Goal downgraded due to lack of progress

## 2020-06-05 NOTE — Progress Notes (Addendum)
Patient ID: [name], male   DOB: 10-30-1945, 75 y.o.   MRN: [id number]  SW received phone call from pt wife [name] requesting to move d/c date back to original d/c 2/22 due to challenges with finding storage space for items since they are making space for the hospital bed. SW discussed with delivery of DME from hospice likely to be on Monday. SW updated medical team on changes.    SW spoke with Chrisylnn/[facility] [phone]) to provide above updates. SW inquired if a roho cushion could be provided if a standard w/c is recommended by therapy. Will follow-up with SW on if this is something to be provided. SW will confirm if home o2 is still needed as pt has been doing well without it.  *per PT, standard w/c not appropriate as pt has difficulty with standing, and unable to tolerate sitting in a w/c for mor than 2 minutes without sliding out of the wheelchair. SW left message for [facility] to provide updates.   [name], MSW, [place] Office: [phone] Cell: [phone] Fax: [phone]

## 2020-06-05 NOTE — Progress Notes (Signed)
[place] PHYSICAL MEDICINE & REHABILITATION PROGRESS NOTE   Subjective/Complaints: No complaints this morning Appears very fatigued but is still very polite  Being changed by nurse tech  ROS: Limited due to cognitive/behavioral   Objective:   No results found. Recent Labs    06/03/20 0645  WBC 4.0  HGB 11.4*  HCT 33.5*  PLT 112*   Recent Labs    06/03/20 0527  NA 136  K 4.4  CL 99  CO2 26  GLUCOSE 173*  BUN 18  CREATININE 0.59*  CALCIUM 9.2    Intake/Output Summary (Last 24 hours) at 06/05/2020 1134 Last data filed at 06/05/2020 1003 Gross per 24 hour  Intake 120 ml  Output 2 ml  Net 118 ml     Pressure Injury 05/14/20 Sacrum Mid Unstageable - Full thickness tissue loss in which the base of the injury is covered by slough (yellow, tan, gray, green or brown) and/or eschar (tan, brown or black) in the wound bed. previously documented as stage 2,  (Active)  05/14/20 0132  Location: Sacrum  Location Orientation: Mid  Staging: Unstageable - Full thickness tissue loss in which the base of the injury is covered by slough (yellow, tan, gray, green or brown) and/or eschar (tan, brown or black) in the wound bed.  Wound Description (Comments): previously documented as stage 2, now unsteagable with with soft black tissue covering wound base  Present on Admission: Yes    Physical Exam: Vital Signs Blood pressure 126/84, pulse 96, temperature 98.1 F (36.7 C), temperature source Axillary, resp. rate 16, height 5\' 11"  (1.803 m), weight 96 kg, SpO2 94 %. Gen: no distress, normal appearing HEENT: oral mucosa pink and moist, NCAT Cardio: Reg rate Chest: normal effort, normal rate of breathing  GI/Abdomen: BS +, non-tender, non-distended Ext: no clubbing, cyanosis, or edema Psych: confused, non-agitated at present.   Skin: Sacral ulcer with dressing, area tender Musc: No edema in extremities.  No tenderness in extremities. Neuro: Alert and oriented x1 again today.  Speech clear. Motor: RUE/RLE: 4/5 proximal distal Left neglect  LUE/LLE: approx 3/5 proximal to distal. Stable  Assessment/Plan: 1. Functional deficits which require 3+ hours per day of interdisciplinary therapy in a comprehensive inpatient rehab setting.  Physiatrist is providing close team supervision and 24 hour management of active medical problems listed below.  Physiatrist and rehab team continue to assess barriers to discharge/monitor patient progress toward functional and medical goals  Care Tool:  Bathing    Body parts bathed by patient: Right arm,Left arm,Chest,Abdomen,Face   Body parts bathed by helper: Front perineal area,Buttocks,Right upper leg,Left upper leg,Right lower leg,Left lower leg     Bathing assist Assist Level: Maximal Assistance - Patient 24 - 49%     Upper Body Dressing/Undressing Upper body dressing   What is the patient wearing?: Pull over shirt    Upper body assist Assist Level: Maximal Assistance - Patient 25 - 49%    Lower Body Dressing/Undressing Lower body dressing      What is the patient wearing?: Incontinence brief,Pants     Lower body assist Assist for lower body dressing: Dependent - Patient 0%     Toileting Toileting Toileting Activity did not occur (Clothing management and hygiene only): N/A (no void or bm)  Toileting assist Assist for toileting: 2 Helpers     Transfers Chair/bed transfer  Transfers assist  Chair/bed transfer activity did not occur: Safety/medical concerns (decreased strength/activity tolerance)  Chair/bed transfer assist level: Dependent - mechanical lift  Locomotion Ambulation   Ambulation assist   Ambulation activity did not occur: Safety/medical concerns (decreased strength/activity tolerance)          Walk 10 feet activity   Assist  Walk 10 feet activity did not occur: Safety/medical concerns        Walk 50 feet activity   Assist Walk 50 feet with 2 turns activity did not  occur: Safety/medical concerns         Walk 150 feet activity   Assist Walk 150 feet activity did not occur: Safety/medical concerns         Walk 10 feet on uneven surface  activity   Assist Walk 10 feet on uneven surfaces activity did not occur: Safety/medical concerns         Wheelchair     Assist Will patient use wheelchair at discharge?: Yes (Per PT long term goals)   Wheelchair activity did not occur: Safety/medical concerns (decreased strength/activity tolerance to transfer to w/c on eval)         Wheelchair 50 feet with 2 turns activity    Assist    Wheelchair 50 feet with 2 turns activity did not occur: Safety/medical concerns       Wheelchair 150 feet activity     Assist  Wheelchair 150 feet activity did not occur: Safety/medical concerns       Blood pressure 126/84, pulse 96, temperature 98.1 F (36.7 C), temperature source Axillary, resp. rate 16, height 5\' 11"  (1.803 m), weight 96 kg, SpO2 94 %.  Medical Problem List and Plan: 1.Functional and mobility deficitssecondary to debility after recurrent seizures and associated respiratory failure. Resolving encephalopathy.  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 Team working on details and transition to home.  2. Antithrombotics: -DVT/anticoagulation:Pharmaceutical:Continue Heparin.  -antiplatelet therapy: N/a 3. Pain Management:Tylenol prn  -turning pt in bed/air mattress  -continue oxycodone for severe pain 4. Mood/agitation: -antipsychotic agents: Continue low dose seroquel to help with sundowning, 25mg  qhs   -limit benzos  -continue pyridoxine 5. Neuropsych: This patientis not fullycapable of making decisions onhisown behalf. 6. Skin/Wound Care:Routine pressure relief measures, turning in bed  -continue foam dressing to sacrum -prevalon boots for bilateral  heels -encourage PO intake 7. Fluids/Electrolytes/Nutrition:encourage PO  -low protein state   -offering protein supps   -BUN up sl on last check but still WNL, continue to push fluids 8. HTN: Monitor BP tid  Controlled on 2/16, continue Norvasc and metoprolol 9. Steroid induced hyperglycemia: Monitor BS ac/hs and use SSI for elevated BS.  amaryl 1mg  daily 1/25, increased to 2 mg on 2/3  2/15 sugars are somewhat labile. Will continue amaryl at 4mg  given hospice plans 10. GBM/Seizures: Has been seizure free on Keppra 2000 mg bid.  -continue decadron 4mg  bid as above  -see #5  No breakthrough seizures   12. Hypoxia: Currently on 2 L per [place]   Continue supplemental oxygen per [place].wean as possible May need to go home on O2 Encourage IS, deep breathing, OOB 13.  Acute lower UTI  10 days amoxil completed 14.  Dysphagia  D3 thins, advance diet as tolerated 15. Thrombocytopenia: 122k 2/7 near baseline, 112 on 2/14    LOS: 23 days A FACE TO FACE EVALUATION WAS PERFORMED  Martha Clan P Dimitra [place] 06/05/2020, 11:34 AM

## 2020-06-05 NOTE — Progress Notes (Signed)
Occupational Therapy Session Note  Patient Details  Name: [name] MRN: [id number] Date of Birth: Aug 12, 1945  Today's Date: 06/05/2020 OT Individual Time: 5015-8682 OT Individual Time Calculation (min): 26 min    Short Term Goals: Week 3:  OT Short Term Goal 1 (Week 3): STG=LTGs d/t home with hospice d/c  Skilled Therapeutic Interventions/Progress Updates:    Pt received supine with NT adjusting position in bed. Total +2 to pull pt up in bed and obtain midline orientation. Very difficult to keep pt in this position as he was constantly pushing himself over to the L to offload pressure from his sacrum. Any assist in positioning caused pt great pain in his sacrum and he would become increasingly agitated. Pt required max A to eat breakfast, requiring total A to load utensil and then cueing to bring to his mouth. A cup of coffee was thickened to nectar thick consistency for him. Applesauce was helpful in pt clearing his oral cavity of residue. Cueing required to maintain arousal. Pt had one instance of coughing, quite delayed after thickened coffee via teaspoon. Suspect oral holding and then reduced arousal contributed. Pt was left supine with pillows supporting him in sidelying. All needs met, bed alarm set.   Therapy Documentation Precautions:  Precautions Precautions: Fall Precaution Comments: Peripheral visual field cut L>R, seizures Restrictions Weight Bearing Restrictions: No Other Position/Activity Restrictions: on 2-4 L/min O2   Therapy/Group: Individual Therapy  Curtis Sites 06/05/2020, 6:22 AM

## 2020-06-05 NOTE — Progress Notes (Signed)
Physical Therapy Weekly Progress Note  Patient Details  Name: [name] MRN: [id number] Date of Birth: April 18, 1946  Beginning of progress report period: May 29, 2020 End of progress report period: June 05, 2020  Today's Date: 06/05/2020 PT Individual Time: 1005-1030 PT Individual Time Calculation (min): 25 min   Patient has met 0 of 3 short term goals. Difficulty meeting family training goals due to session not correlating with time that family is present. Family education session scheduled this week. Patient with fluctuating functional status due to motor planning, initiation, and cognitive deficits. He currently requires mod-max A to roll in the bed, max A for supine<>sit, total A in [place] to stand and dependent transfers with mechanical lift. Patient has not tolerated OOB mobility with PT this week due to sacral pain and fatigue. Plan for family education and d/c planning this week for patient to go home with 24/7 care from family. Hospice consult pending.   Patient continues to demonstrate the following deficits muscle weakness and muscle joint tightness, decreased cardiorespiratoy endurance, impaired timing and sequencing, abnormal tone, decreased coordination and decreased motor planning, decreased midline orientation, decreased attention to left and decreased motor planning, decreased initiation, decreased attention, decreased awareness, decreased problem solving, decreased safety awareness, decreased memory and delayed processing and decreased sitting balance, decreased standing balance, decreased postural control, hemiplegia and decreased balance strategies and therefore will continue to benefit from skilled PT intervention to increase functional independence with mobility.  Patient progressing toward long term goals..  Continue plan of care.  PT Short Term Goals Week 3:  PT Short Term Goal 1 (Week 3): Patient's family will initiate hoyer lift training. PT Short Term  Goal 1 - Progress (Week 3): Progressing toward goal PT Short Term Goal 2 (Week 3): Patient will sit OOB for 1 hour at least 2/7 days this week. PT Short Term Goal 2 - Progress (Week 3): Progressing toward goal PT Short Term Goal 3 (Week 3): Patient's family will initiate training for bed mobility and positioning. PT Short Term Goal 3 - Progress (Week 3): Progressing toward goal Week 4:  PT Short Term Goal 1 (Week 4): STG=LTG due to ELOS.  Skilled Therapeutic Interventions/Progress Updates:     Patient in bed upon PT arrival. Patient alert and agreeable to PT session. Patient reported intermittent sacral pain with mobility during session, RN made aware. PT provided repositioning, rest breaks, and distraction as pain interventions throughout session.   Therapeutic Activity: Bed Mobility: Patient performed rolling R/L for lift sling placement with mod-max A with hand over hand assist for reaching for bed rails. He performed scooting up in bed with total a +2. Provided cues for timing and sequencing for improved pain management.  Transfers: Patient performed dependent lift transfer bed<>manual w/c using the Maxi-move. Patient tolerated the transfer well, except at the end when positioning patient into the chair. Discomfort quickly resolved in sitting.   Patient sat in manual w/c on [place] cushion with supervision initially, however <2 min in sitting the patient began to sink into a posterior L lean causing his hips to shift R and forward in the chair in an unsafe position. Lift sling remained under him in sitting and was used to lift him back to bed safely.   PT is not recommending use of a manual w/c for patient at d/c due to safety concerns secondary to decreased trunk control.   Patient in bed at end of session with breaks locked, bed alarm set, and all needs  within reach.    Therapy Documentation Precautions:  Precautions Precautions: Fall Precaution Comments: Peripheral visual field cut L>R,  seizures Restrictions Weight Bearing Restrictions: No Other Position/Activity Restrictions: on 2-4 L/min O2  Therapy/Group: Individual Therapy  Balin Vandegrift L Sylvia Kondracki PT, DPT  06/05/2020, 4:21 PM

## 2020-06-05 NOTE — Progress Notes (Signed)
Speech Language Pathology Weekly Progress and Session Note  Patient Details  Name: [name] MRN: [id number] Date of Birth: May 01, 1945  Beginning of progress report period: May 29, 2020 End of progress report period: June 05, 2020  Today's Date: 06/05/2020 SLP Individual Time: 1400-1453 SLP Individual Time Calculation (min): 53 min  Short Term Goals: Week 3: SLP Short Term Goal 1 (Week 3): Pt will sustain attention to functional tasks for 10 minute intervals with Max A cues for redirection. SLP Short Term Goal 1 - Progress (Week 3): Not met SLP Short Term Goal 2 (Week 3): Pt will orient to date, place, and situation with Mod A cues for use of external aids or other strategies. SLP Short Term Goal 2 - Progress (Week 3): Not met SLP Short Term Goal 3 (Week 3): Pt will identify 2 cognitive and 2 physical impairment in setting of acute hospitalization with Mod A multimodal cues. SLP Short Term Goal 3 - Progress (Week 3): Not met SLP Short Term Goal 4 (Week 3): Pt will use strategies for speech fluency/reducing verbal repetitions with Min A multimodal cues. SLP Short Term Goal 4 - Progress (Week 3): Met SLP Short Term Goal 5 (Week 3): Pt will consume dys 3 textures and nectar-thick liquids with min cues for use of swallowing precautions and minimal overt s/s of aspiration. SLP Short Term Goal 5 - Progress (Week 3): Met SLP Short Term Goal 6 (Week 3): Patient will demonstrate functional problem solving for basic and familiar tasks with Mod A multimodal cues. SLP Short Term Goal 6 - Progress (Week 3): Not met    New Short Term Goals: Week 4: SLP Short Term Goal 1 (Week 4): STGs=LTGs due to ELOS  Weekly Progress Updates: Patient continues to make slow and inconsistent gains and has met 2 of 6 STGs this reporting period. Currently, patient is consuming Dys. 3 textures and nectar-thick liquids with minimal overt s/s of aspiration but continues to demonstrate intermittent PO intake.  Patient also continues to demonstrate intermittent agitation with inconsistent participation due to ongoing confusion, pain, etc. Therefore, patient requires overall Max-Total A multimodal cues to complete functional and familiar tasks safely in regards to attention, problem solving, recall, and awareness. Patient and family education ongoing with plans now to discharge home with hospice. Patient would benefit from continued skilled SLP intervention to maximize his cognitive and swallowing function prior to discharge.    Intensity: Minumum of 1-2 x/day, 30 to 90 minutes Frequency: 3 to 5 out of 7 days Duration/Length of Stay: 06/10/20 Treatment/Interventions: Cognitive remediation/compensation;Cueing hierarchy;Internal/external aids;Speech/Language facilitation;Therapeutic Activities;Patient/family education;Functional tasks   Daily Session  Skilled Therapeutic Interventions: Skilled treatment session focused on dysphagia goals and education with the patient's wife regarding swallowing and cognitive functioning. Patient's wife educated on patient's current swallowing function, diet recommendations, appropriate textures, medication administration and swallowing compensatory strategies. Patient consumed 4 oz of nectar-thick liquids via straw without overt s/s of aspiration but his diet was downgraded to Dys. 2 textures per his wife's request based on observations with meals (inability to masticate bread, chopping up meats more finely). Patient's wife was also educated on how to appropriately thicken liquids. She verbalized and demonstrated understanding of all information. Patient left upright in bed with alarm on and all needs within reach. Continue with current plan.      Pain Patient reports a headache, unable to rate. Declines medication   Therapy/Group: Individual Therapy  Kacin Dancy 06/05/2020, 6:34 AM

## 2020-06-06 LAB — GLUCOSE, CAPILLARY
Glucose-Capillary: 191 mg/dL — ABNORMAL HIGH (ref 70–99)
Glucose-Capillary: 127 mg/dL — ABNORMAL HIGH (ref 70–99)
Glucose-Capillary: 155 mg/dL — ABNORMAL HIGH (ref 70–99)
Glucose-Capillary: 186 mg/dL — ABNORMAL HIGH (ref 70–99)

## 2020-06-06 NOTE — Progress Notes (Signed)
Speech Language Pathology Daily Session Note  Patient Details  Name: [name] MRN: [id number] Date of Birth: October 25, 1945  Today's Date: 06/06/2020 SLP Individual Time: 0354-6568 SLP Individual Time Calculation (min): 30 min  Short Term Goals: Week 4: SLP Short Term Goal 1 (Week 4): STGs=LTGs due to ELOS  Skilled Therapeutic Interventions: Skilled treatment session focused on cognitive and dysphagia goals. Upon arrival, patient appeared to be more confused today and was talking to himself in his room. Patient had only his brief on in bed and requested that his sweater be removed. SLP explained that he was not wearing one and patient became verbally frustrated. With extra time and encouragement, patient was repositioned in bed. Patient consumed ~2 oz of nectar-thick liquids and several bites of yogurt without overt s/s of aspiration. Patient declined further trials and became lethargic. Patient missed remaining 15 minutes of session due to fatigue. Patient left supine in bed with alarm on and all needs within reach. Continue with current plan of care.      Pain No/Denies Pain   Therapy/Group: Individual Therapy  Bliss Behnke 06/06/2020, 2:58 PM

## 2020-06-06 NOTE — Progress Notes (Signed)
[facility] PHYSICAL MEDICINE & REHABILITATION PROGRESS NOTE   Subjective/Complaints: Sitting in bed. Tells me that his pain is controlled. Asked him specifically about his tailbone and he said his pain was ok.   ROS: Limited due to cognitive/behavioral   Objective:   No results found. No results for input(s): WBC, HGB, HCT, PLT in the last 72 hours. No results for input(s): NA, K, CL, CO2, GLUCOSE, BUN, CREATININE, CALCIUM in the last 72 hours.  Intake/Output Summary (Last 24 hours) at 06/06/2020 0830 Last data filed at 06/06/2020 0700 Gross per 24 hour  Intake 336 ml  Output 2 ml  Net 334 ml     Pressure Injury 05/14/20 Sacrum Mid Unstageable - Full thickness tissue loss in which the base of the injury is covered by slough (yellow, tan, gray, green or brown) and/or eschar (tan, brown or black) in the wound bed. previously documented as stage 2,  (Active)  05/14/20 0132  Location: Sacrum  Location Orientation: Mid  Staging: Unstageable - Full thickness tissue loss in which the base of the injury is covered by slough (yellow, tan, gray, green or brown) and/or eschar (tan, brown or black) in the wound bed.  Wound Description (Comments): previously documented as stage 2, now unsteagable with with soft black tissue covering wound base  Present on Admission: Yes    Physical Exam: Vital Signs Blood pressure (!) 128/94, pulse 97, temperature 98.2 F (36.8 C), resp. rate 18, height 5\' 11"  (1.803 m), weight 96 kg, SpO2 96 %. Constitutional: No distress . Vital signs reviewed. HEENT: EOMI, oral membranes moist Neck: supple Cardiovascular: RRR without murmur. No JVD    Respiratory/Chest: CTA Bilaterally without wheezes or rales. Normal effort    GI/Abdomen: BS +, non-tender, non-distended Ext: no clubbing, cyanosis, or edema Psych: pleasantly confused   Skin: Sacral ulcer with dressing in place  Musc: No edema in extremities.  No tenderness in extremities. Neuro: alert and oriented  to person, hospital. Follows basic commands. Speech clear. Motor: RUE/RLE: 4/5 proximal distal Left neglect  LUE/LLE: approx 3/5 proximal to distal. Stable  Assessment/Plan: 1. Functional deficits which require 3+ hours per day of interdisciplinary therapy in a comprehensive inpatient rehab setting.  Physiatrist is providing close team supervision and 24 hour management of active medical problems listed below.  Physiatrist and rehab team continue to assess barriers to discharge/monitor patient progress toward functional and medical goals  Care Tool:  Bathing    Body parts bathed by patient: Right arm,Left arm,Chest,Abdomen,Face   Body parts bathed by helper: Front perineal area,Buttocks,Right upper leg,Left upper leg,Right lower leg,Left lower leg     Bathing assist Assist Level: Maximal Assistance - Patient 24 - 49%     Upper Body Dressing/Undressing Upper body dressing   What is the patient wearing?: Pull over shirt    Upper body assist Assist Level: Maximal Assistance - Patient 25 - 49%    Lower Body Dressing/Undressing Lower body dressing      What is the patient wearing?: Incontinence brief,Pants     Lower body assist Assist for lower body dressing: Dependent - Patient 0%     Toileting Toileting Toileting Activity did not occur (Clothing management and hygiene only): N/A (no void or bm)  Toileting assist Assist for toileting: 2 Helpers     Transfers Chair/bed transfer  Transfers assist  Chair/bed transfer activity did not occur: Safety/medical concerns (decreased strength/activity tolerance)  Chair/bed transfer assist level: Dependent - mechanical lift     Locomotion Ambulation  Ambulation assist   Ambulation activity did not occur: Safety/medical concerns (decreased strength/activity tolerance)          Walk 10 feet activity   Assist  Walk 10 feet activity did not occur: Safety/medical concerns        Walk 50 feet activity   Assist  Walk 50 feet with 2 turns activity did not occur: Safety/medical concerns         Walk 150 feet activity   Assist Walk 150 feet activity did not occur: Safety/medical concerns         Walk 10 feet on uneven surface  activity   Assist Walk 10 feet on uneven surfaces activity did not occur: Safety/medical concerns         Wheelchair     Assist Will patient use wheelchair at discharge?: Yes (Per PT long term goals)   Wheelchair activity did not occur: Safety/medical concerns (decreased strength/activity tolerance to transfer to w/c on eval)         Wheelchair 50 feet with 2 turns activity    Assist    Wheelchair 50 feet with 2 turns activity did not occur: Safety/medical concerns       Wheelchair 150 feet activity     Assist  Wheelchair 150 feet activity did not occur: Safety/medical concerns       Blood pressure (!) 128/94, pulse 97, temperature 98.2 F (36.8 C), resp. rate 18, height 5\' 11"  (1.803 m), weight 96 kg, SpO2 96 %.  Medical Problem List and Plan: 1.Functional and mobility deficitssecondary to debility after recurrent seizures and associated respiratory failure. Resolving encephalopathy.  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 Team working on details and transition to home. ELOS is 06/11/20 2. Antithrombotics: -DVT/anticoagulation:Pharmaceutical:Continue Heparin.  -antiplatelet therapy: N/a 3. Pain Management:Tylenol prn  -turning pt in bed/air mattress  -continue oxycodone for severe pain 4. Mood/agitation: -antipsychotic agents: Continue low dose seroquel to help with sundowning, 25mg  qhs   -limit benzos  -continue pyridoxine 5. Neuropsych: This patientis not fullycapable of making decisions onhisown behalf. 6. Skin/Wound Care:Routine pressure relief measures, turning in bed  -continue foam dressing to  sacrum -prevalon boots for bilateral heels -encourage PO intake 7. Fluids/Electrolytes/Nutrition:   -low protein state   -offering protein supps   -encouraging fluids 8. HTN: Monitor BP tid  Controlled on 2/16, continue Norvasc and metoprolol 9. Steroid induced hyperglycemia: Monitor BS ac/hs and use SSI for elevated BS.  amaryl 1mg  daily 1/25, increased to 2 mg on 2/3  2/15 sugars are somewhat labile. Will continue amaryl at 4mg  given hospice plans 10. GBM/Seizures: Has been seizure free on Keppra 2000 mg bid.  -continue decadron 4mg  bid as above  -see #5  No breakthrough seizures   12. Hypoxia: Currently on 2 L per [place]   Continue supplemental oxygen per [place].will go home on O2 Encourage IS, deep breathing, OOB as possible 13.  Acute lower UTI  10 days amoxil completed 14.  Dysphagia  Diet adjusted to D2/nectars d/t increased aspiration risk per SLP 15. Thrombocytopenia: 122k 2/7 near baseline, 112 on 2/14    LOS: 24 days A FACE TO [place] 06/06/2020, 8:30 AM

## 2020-06-06 NOTE — Progress Notes (Signed)
Physical Therapy Session Note  Patient Details  Name: [name] MRN: [id number] Date of Birth: 1946-03-01  Today's Date: 06/06/2020 PT Individual Time: 1130-1153 PT Individual Time Calculation (min): 23 min   Short Term Goals: Week 4:  PT Short Term Goal 1 (Week 4): STG=LTG due to ELOS.  Skilled Therapeutic Interventions/Progress Updates:     Patient in bed asleep upon PT arrival. Patient easily aroused to verbal stimulation and agreeable to PT session. Patient denied pain during session. Patient remained lethargic/fatigued throughout session with his eyes closed and intermittently falling asleep, but able to be aroused to verbal stimulation. SPO2 96%, HR 100 on RA.  Patient confused, talking about his "guy friend" coming to see him. Provided orientation to patient. Patient tearful and asking how long he has been here and when he is going home. Discussed LOS, d/c date, d/c planning, and family education scheduled for Sunday. Patient tearful about his son's coming for family education. Provided therapeutic listening and emotional support. Patient declined sitting EOB due to increased fatigue, but agreeable to bed level exercises.  Therapeutic Exercise: Patient performed the following exercises with verbal and tactile cues for proper technique. -B heel cord stretch 2x1 min -B knee hip/flexion PROM/AAROM fluctuating depending on arousal 2x6 -attempted SLR with AAROM on R x2, patient unable to maintain arousal to attend to exercises  Patient in bed asleep at end of session with breaks locked, bed alarm set, and all needs within reach. Patient missed 7 min of skilled PT due to fatigue, RN made aware. Will attempt to make-up missed time as able.     Therapy Documentation Precautions:  Precautions Precautions: Fall Precaution Comments: Peripheral visual field cut L>R, seizures Restrictions Weight Bearing Restrictions: No Other Position/Activity Restrictions: on 2-4 L/min  O2 General: PT Amount of Missed Time (min): 7 Minutes PT Missed Treatment Reason: Patient fatigue   Therapy/Group: Individual Therapy  Roshonda Sperl L Lavon Horn PT, DPT  06/06/2020, 11:55 AM

## 2020-06-06 NOTE — Progress Notes (Signed)
Occupational Therapy Session Note  Patient Details  Name: [name] MRN: [id number] Date of Birth: 1945/06/01  Today's Date: 06/06/2020 OT Individual Time: 0700-0725 OT Individual Time Calculation (min): 25 min    Short Term Goals: Week 1:  OT Short Term Goal 1 (Week 1): Pt will maintain EOB sitting balance with mod A OT Short Term Goal 1 - Progress (Week 1): Met OT Short Term Goal 2 (Week 1): Pt will set up grooming tasks with supervision to demo improved motor planning OT Short Term Goal 2 - Progress (Week 1): Met OT Short Term Goal 3 (Week 1): Pt will don shirt with mod A to demo improved sequencing OT Short Term Goal 3 - Progress (Week 1): Met OT Short Term Goal 4 (Week 1): Pt will require no more than min cueing to attend to dressing task OT Short Term Goal 4 - Progress (Week 1): Met  Skilled Therapeutic Interventions/Progress Updates:    1:1. Pt received in bed asleep with urine smell presnt in room. Brief checked found to be clean but blanket wet. Ot removed blanket and provided total A for peri care cleansing despite attempt for pt to initiate cleansing. Pt provided with new brief and blankets. Pt repositioned in bed with total A to scoot up to [facility] and shift to midline to counteract L lean. Pt initates well with bringing nectar thick cramberry juice to mouth but does not notice spilling out of L corner of mouth. Pt able to wash face after drinking. Exited session with pt seated in bed, exit alarm on and call light in reach   Therapy Documentation Precautions:  Precautions Precautions: Fall Precaution Comments: Peripheral visual field cut L>R, seizures Restrictions Weight Bearing Restrictions: No Other Position/Activity Restrictions: on 2-4 L/min O2 General:   Vital Signs: Therapy Vitals Temp: 98.2 F (36.8 C) Pulse Rate: 97 Resp: 18 BP: (!) 128/94 Patient Position (if appropriate): Lying Oxygen Therapy SpO2: 96 % O2 Device: Room Air Pain:    ADL: ADL Eating: Minimal assistance Where Assessed-Eating: Bed level Grooming: Minimal assistance,Moderate cueing Where Assessed-Grooming: Edge of bed Upper Body Bathing: Minimal assistance,Moderate cueing Where Assessed-Upper Body Bathing: Edge of bed Lower Body Bathing: Dependent,Moderate cueing Where Assessed-Lower Body Bathing: Bed level Upper Body Dressing: Maximal assistance,Moderate cueing Where Assessed-Upper Body Dressing: Edge of bed Lower Body Dressing: Dependent Where Assessed-Lower Body Dressing: Bed level Toileting: Unable to assess Toilet Transfer Method: Unable to assess Vision   Perception    Praxis   Exercises:   Other Treatments:     Therapy/Group: Individual Therapy  Tonny Branch 06/06/2020, 6:47 AM

## 2020-06-07 LAB — GLUCOSE, CAPILLARY
Glucose-Capillary: 228 mg/dL — ABNORMAL HIGH (ref 70–99)
Glucose-Capillary: 189 mg/dL — ABNORMAL HIGH (ref 70–99)
Glucose-Capillary: 159 mg/dL — ABNORMAL HIGH (ref 70–99)
Glucose-Capillary: 143 mg/dL — ABNORMAL HIGH (ref 70–99)

## 2020-06-07 NOTE — Progress Notes (Signed)
[place] PHYSICAL MEDICINE & REHABILITATION PROGRESS NOTE   Subjective/Complaints: Pt in bed with clothes pulled off. Says he uncomfortable.   ROS: Limited due to cognitive/behavioral    Objective:   No results found. No results for input(s): WBC, HGB, HCT, PLT in the last 72 hours. No results for input(s): NA, K, CL, CO2, GLUCOSE, BUN, CREATININE, CALCIUM in the last 72 hours.  Intake/Output Summary (Last 24 hours) at 06/07/2020 1124 Last data filed at 06/07/2020 0953 Gross per 24 hour  Intake 80 ml  Output 6 ml  Net 74 ml     Pressure Injury 05/14/20 Sacrum Mid Unstageable - Full thickness tissue loss in which the base of the injury is covered by slough (yellow, tan, gray, green or brown) and/or eschar (tan, brown or black) in the wound bed. previously documented as stage 2,  (Active)  05/14/20 0132  Location: Sacrum  Location Orientation: Mid  Staging: Unstageable - Full thickness tissue loss in which the base of the injury is covered by slough (yellow, tan, gray, green or brown) and/or eschar (tan, brown or black) in the wound bed.  Wound Description (Comments): previously documented as stage 2, now unsteagable with with soft black tissue covering wound base  Present on Admission: Yes    Physical Exam: Vital Signs Blood pressure (!) 137/93, pulse (!) 107, temperature 98.2 F (36.8 C), resp. rate 14, height 5\' 11"  (1.803 m), weight 96 kg, SpO2 97 %. Constitutional: No distress . Vital signs reviewed. HEENT: EOMI, oral membranes moist Neck: supple Cardiovascular: RRR without murmur. No JVD    Respiratory/Chest: CTA Bilaterally without wheezes or rales. Normal effort    GI/Abdomen: BS +, non-tender, non-distended Ext: no clubbing, cyanosis, or edema Psych: confused, a little restless today   Skin: Sacral ulcer with dressing in place, multiple small bruises Musc: No edema in extremities.  No tenderness in extremities. Neuro: alert and oriented to person, hospital.  Follows basic commands. Speech clear. Motor: RUE/RLE: 4/5 proximal distal Left neglect  LUE/LLE: 3- to 3/5 proximal to distal. Stable  Assessment/Plan: 1. Functional deficits which require 3+ hours per day of interdisciplinary therapy in a comprehensive inpatient rehab setting.  Physiatrist is providing close team supervision and 24 hour management of active medical problems listed below.  Physiatrist and rehab team continue to assess barriers to discharge/monitor patient progress toward functional and medical goals  Care Tool:  Bathing    Body parts bathed by patient: Right arm,Left arm,Chest,Abdomen,Face   Body parts bathed by helper: Front perineal area,Buttocks,Right upper leg,Left upper leg,Right lower leg,Left lower leg     Bathing assist Assist Level: Maximal Assistance - Patient 24 - 49%     Upper Body Dressing/Undressing Upper body dressing   What is the patient wearing?: Pull over shirt    Upper body assist Assist Level: Maximal Assistance - Patient 25 - 49%    Lower Body Dressing/Undressing Lower body dressing      What is the patient wearing?: Incontinence brief,Pants     Lower body assist Assist for lower body dressing: Dependent - Patient 0%     Toileting Toileting Toileting Activity did not occur (Clothing management and hygiene only): N/A (no void or bm)  Toileting assist Assist for toileting: 2 Helpers     Transfers Chair/bed transfer  Transfers assist  Chair/bed transfer activity did not occur: Safety/medical concerns (decreased strength/activity tolerance)  Chair/bed transfer assist level: Dependent - mechanical lift     Locomotion Ambulation   Ambulation assist  Ambulation activity did not occur: Safety/medical concerns (decreased strength/activity tolerance)          Walk 10 feet activity   Assist  Walk 10 feet activity did not occur: Safety/medical concerns        Walk 50 feet activity   Assist Walk 50 feet with 2  turns activity did not occur: Safety/medical concerns         Walk 150 feet activity   Assist Walk 150 feet activity did not occur: Safety/medical concerns         Walk 10 feet on uneven surface  activity   Assist Walk 10 feet on uneven surfaces activity did not occur: Safety/medical concerns         Wheelchair     Assist Will patient use wheelchair at discharge?: Yes (Per PT long term goals)   Wheelchair activity did not occur: Safety/medical concerns (decreased strength/activity tolerance to transfer to w/c on eval)         Wheelchair 50 feet with 2 turns activity    Assist    Wheelchair 50 feet with 2 turns activity did not occur: Safety/medical concerns       Wheelchair 150 feet activity     Assist  Wheelchair 150 feet activity did not occur: Safety/medical concerns       Blood pressure (!) 137/93, pulse (!) 107, temperature 98.2 F (36.8 C), resp. rate 14, height 5\' 11"  (1.803 m), weight 96 kg, SpO2 97 %.  Medical Problem List and Plan: 1.Functional and mobility deficitssecondary to debility after recurrent seizures and associated respiratory failure. Resolving encephalopathy.  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 Team/family working on finalizing plans for discharge to home  06/11/20 2. Antithrombotics: -DVT/anticoagulation:Pharmaceutical:Continue Heparin.  -antiplatelet therapy: N/a 3. Pain Management:Tylenol prn  -turning pt in bed/air mattress  -continue oxycodone for severe pain 4. Mood/agitation: -antipsychotic agents: Continue low dose seroquel to help with sundowning, 25mg  qhs   -limit benzos  -continue pyridoxine 5. Neuropsych: This patientis not fullycapable of making decisions onhisown behalf. 6. Skin/Wound Care:Routine pressure relief measures, turning in bed  -continue foam dressing to sacrum -prevalon  boots for bilateral heels -encourage PO intake 7. Fluids/Electrolytes/Nutrition:   -low protein state   -offering protein supps   -encouraging fluids 8. HTN: Monitor BP tid  Controlled on 2/16, continue Norvasc and metoprolol 9. Steroid induced hyperglycemia: Monitor BS ac/hs and use SSI for elevated BS.  amaryl 1mg  daily 1/25, increased to 2 mg on 2/3  2/18 sugars are somewhat labile. Will not change amaryl 4mg  given hospice plans 10. GBM/Seizures: Has been seizure free on Keppra 2000 mg bid.  -continue decadron 4mg  bid as above  -see #5  No breakthrough seizures   12. Hypoxia: Currently on 2 L per [place]   Continue supplemental oxygen per [place].will go home on O2 Encourage IS, deep breathing, OOB as possible 13.  Acute lower UTI  10 days amoxil completed, urine clear, straw colored 14.  Dysphagia  Diet adjusted to D2/nectars d/t increased aspiration risk per SLP 15. Thrombocytopenia: 122k 2/7 near baseline, 112 on 2/14   -will not f/u given hospice status  LOS: 25 days A FACE TO FACE EVALUATION WAS PERFORMED  Meredith Staggers 06/07/2020, 11:24 AM

## 2020-06-07 NOTE — Progress Notes (Signed)
Occupational Therapy Session Note  Patient Details  Name: [name] MRN: [id number] Date of Birth: April 05, 1946  Today's Date: 06/07/2020 OT Individual Time: 4481-8563 OT Individual Time Calculation (min): 34 min    Short Term Goals: Week 1:  OT Short Term Goal 1 (Week 1): Pt will maintain EOB sitting balance with mod A OT Short Term Goal 1 - Progress (Week 1): Met OT Short Term Goal 2 (Week 1): Pt will set up grooming tasks with supervision to demo improved motor planning OT Short Term Goal 2 - Progress (Week 1): Met OT Short Term Goal 3 (Week 1): Pt will don shirt with mod A to demo improved sequencing OT Short Term Goal 3 - Progress (Week 1): Met OT Short Term Goal 4 (Week 1): Pt will require no more than min cueing to attend to dressing task OT Short Term Goal 4 - Progress (Week 1): Met  Skilled Therapeutic Interventions/Progress Updates:    1:1. Pt received in bed agreeable to OT. Pt asking what day of the week it is and OT provides orientation information. Pt wife present for session and providing encouragement for participation. Pt initiation of LE mobility to EOB 30% of time with tactile cues but overall MAX A of 2 for sup>sit. Pt requires manual placement of RUE onto bed rail to assist with midline orientation and decreasing pushing tendencies. Pt compeltes 2 sit to stand with standard chair and MAX Aof 2 to arise to semi stand with stooped posture and wife standing in front of pt encouraging eye contact. Pt with skin tear on L forearm during second stand which NT addressed. Exited session with pt seated in bed, exit alarm on and call light in reach   Therapy Documentation Precautions:  Precautions Precautions: Fall Precaution Comments: Peripheral visual field cut L>R, seizures Restrictions Weight Bearing Restrictions: No Other Position/Activity Restrictions: on 2-4 L/min O2 General:   Vital Signs:  Pain:   ADL: ADL Eating: Minimal assistance Where  Assessed-Eating: Bed level Grooming: Minimal assistance,Moderate cueing Where Assessed-Grooming: Edge of bed Upper Body Bathing: Minimal assistance,Moderate cueing Where Assessed-Upper Body Bathing: Edge of bed Lower Body Bathing: Dependent,Moderate cueing Where Assessed-Lower Body Bathing: Bed level Upper Body Dressing: Maximal assistance,Moderate cueing Where Assessed-Upper Body Dressing: Edge of bed Lower Body Dressing: Dependent Where Assessed-Lower Body Dressing: Bed level Toileting: Unable to assess Toilet Transfer Method: Unable to assess Vision   Perception    Praxis   Exercises:   Other Treatments:     Therapy/Group: Individual Therapy  Tonny Branch 06/07/2020, 1:02 PM

## 2020-06-07 NOTE — Progress Notes (Signed)
Manufacturing engineer [facility])  Hospital liaison spoke with pt's spouse to continue education related to hospice philosophy and services and to answer any questions at this time.  Mrs. Myler verbalized understanding of information given.  Per discussion the plan is to discharge home Tuesday by private vehicle.   DME needs discussed.  Abed with extension, over bed table, reclining wheelchair, seat cushion, and Hoyer lift will be ordered for delivery on Monday.  Address has been verified and is correct in the chart.  ACC information and contact numbers given to pt's spouse. Please call with any questions or concerns.  Thank you for the opportunity to participate in this pt's care.  [name], BSN, RN [employer] [phone] (24h on call)

## 2020-06-07 NOTE — Progress Notes (Signed)
Physical Therapy Session Note  Patient Details  Name: [name] MRN: [id number] Date of Birth: [birth year]  Today's Date: 06/07/2020 PT Individual Time: 1015-1045 PT Individual Time Calculation (min): 30 min   Short Term Goals: Week 1:  PT Short Term Goal 1 (Week 1): Patient will perform bed mobility with mod A consistently. PT Short Term Goal 1 - Progress (Week 1): Progressing toward goal PT Short Term Goal 2 (Week 1): Patient will perform basic transfers with max A +2 using LRAD. PT Short Term Goal 2 - Progress (Week 1): Partly met PT Short Term Goal 3 (Week 1): Patient will sit OOB >1 hour 2x per day 4/7 days this week. PT Short Term Goal 3 - Progress (Week 1): Progressing toward goal Week 2:  PT Short Term Goal 1 (Week 2): Patient will sit OOB >1 hour 2x per day 4/7 days this week. PT Short Term Goal 1 - Progress (Week 2): Not met PT Short Term Goal 2 (Week 2): Patient will perform bed mobility with mod A consistently. PT Short Term Goal 2 - Progress (Week 2): Not progressing PT Short Term Goal 3 (Week 2): Patient will perform basic transfers with max A using LRAD. PT Short Term Goal 3 - Progress (Week 2): Not progressing Week 3:  PT Short Term Goal 1 (Week 3): Patient's family will initiate hoyer lift training. PT Short Term Goal 1 - Progress (Week 3): Progressing toward goal PT Short Term Goal 2 (Week 3): Patient will sit OOB for 1 hour at least 2/7 days this week. PT Short Term Goal 2 - Progress (Week 3): Progressing toward goal PT Short Term Goal 3 (Week 3): Patient's family will initiate training for bed mobility and positioning. PT Short Term Goal 3 - Progress (Week 3): Progressing toward goal  Skilled Therapeutic Interventions/Progress Updates:    PAIN pt verbalized "ouch" when touched throughout but unable to address when questioned  Pt initially resting supine.  Pt had removed gown and blankets and was grasping brief firmly w/R hand Could not release brief to  command but able to place ball in hand and then attach brief.  Pt supine to sit on edge of bed total assist.  Pt initially pushing strongly w/RUE, leaning post/L.  Able to reposition hands to lap w/improved sitting posture, varies from min  to max assist.  Pt engaged in single word responses to questions. Confused and disoriented. Donned gown total assist.  Wife arrived while pt sitting on edge of bed.  Pt vaguely aware of her presence.  Pt tolerated 15 min in sitting but began to lean heavily to L and appears to be falling asleep.  Returned supine total assist.  Repositioned for comfort.  Wife w/questions re:  Procedure and schedule for FT on Sunday.  Provided w/schedule and explained check in procedure.  Checked w/social worker who assured therapist/relayed to wife that arrangements were in place for this.  Pt left supine w/rails up x 4, alarm set, bed in lowest position, and needs in reach.    Therapy Documentation Precautions:  Precautions Precautions: Fall Precaution Comments: Peripheral visual field cut L>R, seizures Restrictions Weight Bearing Restrictions: No Other Position/Activity Restrictions: on 2-4 L/min O2    Therapy/Group: Individual Therapy  Callie Fielding, [place] 06/07/2020, 1:11 PM

## 2020-06-07 NOTE — Progress Notes (Signed)
Speech Language Pathology Daily Session Note  Patient Details  Name: [name] MRN: [id number] Date of Birth: 09/23/1945  Today's Date: 06/07/2020 SLP Individual Time: 1045-1110 SLP Individual Time Calculation (min): 25 min  Short Term Goals: Week 4: SLP Short Term Goal 1 (Week 4): STGs=LTGs due to ELOS  Skilled Therapeutic Interventions: Skilled treatment session focused on cognitive goals and continued family education with the patient's wife. Upon arrival, patient was asleep in bed with wife present. Patient would arouse to light tough and voice for ~1-2 minutes at most but was unable to maintain arousal efficient enough for participation.  Patient's wife present and demonstrated how to appropriately thicken coffee to a nectar-thick consistency with overall supervision level verbal cues. Patient consumed ~2 spoonfuls of coffee without overt s/s of aspiration. Patient left supine in bed with wife present. Continue with current plan of care.      Pain No/Denies Pain   Therapy/Group: Individual Therapy  Quinne Pires, [place] 06/07/2020, 3:17 PM

## 2020-06-08 LAB — GLUCOSE, CAPILLARY
Glucose-Capillary: 215 mg/dL — ABNORMAL HIGH (ref 70–99)
Glucose-Capillary: 211 mg/dL — ABNORMAL HIGH (ref 70–99)
Glucose-Capillary: 189 mg/dL — ABNORMAL HIGH (ref 70–99)
Glucose-Capillary: 103 mg/dL — ABNORMAL HIGH (ref 70–99)
Glucose-Capillary: 175 mg/dL — ABNORMAL HIGH (ref 70–99)

## 2020-06-08 NOTE — Plan of Care (Signed)
  Problem: Consults Goal: RH GENERAL PATIENT EDUCATION Description: See Patient Education module for education specifics. Outcome: Progressing Goal: Skin Care Protocol Initiated - if Braden Score 18 or less Description: If consults are not indicated, leave blank or document N/A Outcome: Progressing Goal: Diabetes Guidelines if Diabetic/Glucose > 140 Description: If diabetic or lab glucose is > 140 mg/dl - Initiate Diabetes/Hyperglycemia Guidelines & Document Interventions  Outcome: Progressing   Problem: RH BOWEL ELIMINATION Goal: RH STG MANAGE BOWEL WITH ASSISTANCE Description: STG Manage Bowel with min-mod Assistance. Outcome: Progressing Goal: RH STG MANAGE BOWEL W/MEDICATION W/ASSISTANCE Description: STG Manage Bowel with Medication with mod Assistance. Outcome: Progressing   Problem: RH BLADDER ELIMINATION Goal: RH STG MANAGE BLADDER WITH ASSISTANCE Description: STG Manage Bladder With min-mod Assistance Outcome: Progressing   Problem: RH SKIN INTEGRITY Goal: RH STG SKIN FREE OF INFECTION/BREAKDOWN Description: Skin to remain free from infection, breakdown, and/or tears with min-mod assistance. Outcome: Progressing Goal: RH STG ABLE TO PERFORM INCISION/WOUND CARE W/ASSISTANCE Description: STG Able To Perform Incision/Wound Care With min-mod Assistance. Outcome: Progressing   Problem: RH SAFETY Goal: RH STG ADHERE TO SAFETY PRECAUTIONS W/ASSISTANCE/DEVICE Description: STG Adhere to Safety Precautions With min-mod Assistance/Device. Outcome: Progressing   Problem: RH PAIN MANAGEMENT Goal: RH STG PAIN MANAGED AT OR BELOW PT'S PAIN GOAL Description: <4 on a 0-10 pain scale. Outcome: Progressing   Problem: RH KNOWLEDGE DEFICIT GENERAL Goal: RH STG INCREASE KNOWLEDGE OF SELF CARE AFTER HOSPITALIZATION Description: Patient will demonstrate knowledge of medication management, dietary restrictions, diabetes management, wound and skin care management will educational materials  and handouts provided by staff. Outcome: Progressing

## 2020-06-08 NOTE — Progress Notes (Signed)
Speech Language Pathology Discharge Summary  Patient Details  Name: SKILER OLDEN MRN: [id number] Date of Birth: [birth year]  Patient has met 3 of 6 long term goals.  Patient to discharge at overall Max level.   Reasons goals not met: Patient has had a decline in function and is now discharging home with Hospice   Clinical Impression/Discharge Summary: Patient has made minimal gains due to a decline in function since admission and has met 3 of 6 LTGs this admission. Currently, patient is consuming Dys. 2 textures with nectar-thick liquids with minimal overt s/s of aspiration. Patient currently requires overall Max-Total A to complete functional and familiar tasks safely in regards to orientation, initiation, attention, problem solving, recall and awareness. Patient also continues to demonstrate generalized confusion with intermittent agitation. Patient and family education is complete and patient will discharge home with hospice care. Because of this, f/u is not warranted at this time.   Care Partner:  Caregiver Able to Provide Assistance: Yes  Type of Caregiver Assistance: Physical;Cognitive  Recommendation:  24 hour supervision/assistance      Equipment: Thickener   Reasons for discharge: Discharged from hospital   Patient/Family Agrees with Progress Made and Goals Achieved: Yes    Arbutus Leas 06/10/2020, 7:27 AM

## 2020-06-08 NOTE — Progress Notes (Signed)
[place] PHYSICAL MEDICINE & REHABILITATION PROGRESS NOTE   Subjective/Complaints: Patient appears fatigued, labored breathing, voices no complaints.   ROS: limited due to cognitive/behavioral    Objective:   No results found. No results for input(s): WBC, HGB, HCT, PLT in the last 72 hours. No results for input(s): NA, K, CL, CO2, GLUCOSE, BUN, CREATININE, CALCIUM in the last 72 hours.  Intake/Output Summary (Last 24 hours) at 06/08/2020 1058 Last data filed at 06/08/2020 0900 Gross per 24 hour  Intake 298 ml  Output 4 ml  Net 294 ml     Pressure Injury 05/14/20 Sacrum Mid Unstageable - Full thickness tissue loss in which the base of the injury is covered by slough (yellow, tan, gray, green or brown) and/or eschar (tan, brown or black) in the wound bed. previously documented as stage 2,  (Active)  05/14/20 0132  Location: Sacrum  Location Orientation: Mid  Staging: Unstageable - Full thickness tissue loss in which the base of the injury is covered by slough (yellow, tan, gray, green or brown) and/or eschar (tan, brown or black) in the wound bed.  Wound Description (Comments): previously documented as stage 2, now unsteagable with with soft black tissue covering wound base  Present on Admission: Yes    Physical Exam: Vital Signs Blood pressure 124/84, pulse 88, temperature 98.4 F (36.9 C), temperature source Oral, resp. rate 20, height 5\' 11"  (1.803 m), weight 96 kg, SpO2 96 %. Gen: no distress, normal appearing HEENT: oral mucosa pink and moist, NCAT Cardio: Reg rate Chest: labored breathing Abd: soft, non-distended Ext: no edema Psych: pleasant, normal affect Skin: intact Neuro: Musculoskeletal: GI/Abdomen: BS +, non-tender, non-distended Ext: no clubbing, cyanosis, or edema Psych: confused, a little restless today   Skin: Sacral ulcer with dressing in place, multiple small bruises Musc: No edema in extremities.  No tenderness in extremities. Neuro: alert and  oriented to person, hospital. Follows basic commands. Speech clear. Motor: RUE/RLE: 4/5 proximal distal Left neglect  LUE/LLE: 3- to 3/5 proximal to distal. Stable  Assessment/Plan: 1. Functional deficits which require 3+ hours per day of interdisciplinary therapy in a comprehensive inpatient rehab setting.  Physiatrist is providing close team supervision and 24 hour management of active medical problems listed below.  Physiatrist and rehab team continue to assess barriers to discharge/monitor patient progress toward functional and medical goals  Care Tool:  Bathing    Body parts bathed by patient: Right arm,Left arm,Chest,Abdomen,Face   Body parts bathed by helper: Front perineal area,Buttocks,Right upper leg,Left upper leg,Right lower leg,Left lower leg     Bathing assist Assist Level: Maximal Assistance - Patient 24 - 49%     Upper Body Dressing/Undressing Upper body dressing   What is the patient wearing?: Pull over shirt    Upper body assist Assist Level: Maximal Assistance - Patient 25 - 49%    Lower Body Dressing/Undressing Lower body dressing      What is the patient wearing?: Incontinence brief,Pants     Lower body assist Assist for lower body dressing: Dependent - Patient 0%     Toileting Toileting Toileting Activity did not occur (Clothing management and hygiene only): N/A (no void or bm)  Toileting assist Assist for toileting: 2 Helpers     Transfers Chair/bed transfer  Transfers assist  Chair/bed transfer activity did not occur: Safety/medical concerns (decreased strength/activity tolerance)  Chair/bed transfer assist level: Dependent - mechanical lift     Locomotion Ambulation   Ambulation assist   Ambulation activity did not  occur: Safety/medical concerns (decreased strength/activity tolerance)          Walk 10 feet activity   Assist  Walk 10 feet activity did not occur: Safety/medical concerns        Walk 50 feet  activity   Assist Walk 50 feet with 2 turns activity did not occur: Safety/medical concerns         Walk 150 feet activity   Assist Walk 150 feet activity did not occur: Safety/medical concerns         Walk 10 feet on uneven surface  activity   Assist Walk 10 feet on uneven surfaces activity did not occur: Safety/medical concerns         Wheelchair     Assist Will patient use wheelchair at discharge?: Yes (Per PT long term goals)   Wheelchair activity did not occur: Safety/medical concerns (decreased strength/activity tolerance to transfer to w/c on eval)         Wheelchair 50 feet with 2 turns activity    Assist    Wheelchair 50 feet with 2 turns activity did not occur: Safety/medical concerns       Wheelchair 150 feet activity     Assist  Wheelchair 150 feet activity did not occur: Safety/medical concerns       Blood pressure 124/84, pulse 88, temperature 98.4 F (36.9 C), temperature source Oral, resp. rate 20, height 5\' 11"  (1.803 m), weight 96 kg, SpO2 96 %.  Medical Problem List and Plan: 1.Functional and mobility deficitssecondary to debility after recurrent seizures and associated respiratory failure. Resolving encephalopathy.  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 Team/family working on finalizing plans for discharge to home  06/11/20 2. Antithrombotics: -DVT/anticoagulation:Pharmaceutical:Continue Heparin.  -antiplatelet therapy: N/a 3. Pain Management:Tylenol prn  -turning pt in bed/air mattress  -continue oxycodone for severe pain 4. Mood/agitation: -antipsychotic agents: Continue low dose seroquel to help with sundowning, 25mg  qhs   -limit benzos  -continue pyridoxine 5. Neuropsych: This patientis not fullycapable of making decisions onhisown behalf. 6. Skin/Wound Care:Routine pressure relief measures, turning in  bed  -continue foam dressing to sacrum -prevalon boots for bilateral heels -encourage PO intake 7. Fluids/Electrolytes/Nutrition:   -low protein state   -offering protein supps   -encouraging fluids 8. HTN: Monitor BP tid  Controlled on 2/19, continue Norvasc and metoprolol 9. Steroid induced hyperglycemia: Monitor BS ac/hs and use SSI for elevated BS.  amaryl 1mg  daily 1/25, increased to 2 mg on 2/3  2/18 sugars are somewhat labile. Will not change amaryl 4mg  given hospice plans 10. GBM/Seizures: Has been seizure free on Keppra 2000 mg bid.  -continue decadron 4mg  bid as above  -see #5  No breakthrough seizures   12. Hypoxia: Currently on 2 L per [place]   Continue supplemental oxygen per [place].will go home on O2 Encourage IS, deep breathing, OOB as possible 13.  Acute lower UTI  10 days amoxil completed, urine clear, straw colored 14.  Dysphagia  Diet adjusted to D2/nectars d/t increased aspiration risk per SLP 15. Thrombocytopenia: 122k 2/7 near baseline, 112 on 2/14   -will not f/u given hospice status  LOS: 26 days A FACE TO FACE EVALUATION WAS PERFORMED  [name] 06/08/2020, 10:58 AM

## 2020-06-09 LAB — GLUCOSE, CAPILLARY
Glucose-Capillary: 189 mg/dL — ABNORMAL HIGH (ref 70–99)
Glucose-Capillary: 118 mg/dL — ABNORMAL HIGH (ref 70–99)
Glucose-Capillary: 137 mg/dL — ABNORMAL HIGH (ref 70–99)
Glucose-Capillary: 93 mg/dL (ref 70–99)

## 2020-06-09 NOTE — Progress Notes (Signed)
Occupational Therapy Session Note  Patient Details  Name: [name] MRN: [id number] Date of Birth: December 11, 1945  Today's Date: 06/09/2020 OT Individual Time: 1100-1215 OT Individual Time Calculation (min): 75 min    Short Term Goals: Week 3:  OT Short Term Goal 1 (Week 3): STG=LTGs d/t home with hospice d/c  Skilled Therapeutic Interventions/Progress Updates:    Pt received supine with his wife and sons present for family education session. Focused on bed level care, including bathing, dressing, and general bed mobility/positioning. Provided demonstration re manual hoyer use as well as TIS w/c. Initiated bathing at bed level. Upon rolling, incontinent BM present, pt unaware but attempting to grab at brief so likely somewhat aware of at least the discomfort. Pt completed rolling R and L with mod A +2, his wife acting as +2 for hands on education. Max A peri care and brief change. Pt required frequent rest break d/t agitation and fatigue. He was able to participate in rolling when motivated to do so. Extra focus placed on pt and family safety at home, including body mechanics during hands on pt care. Pt was left supine with all needs met.   Therapy Documentation Precautions:  Precautions Precautions: Fall Precaution Comments: Peripheral visual field cut L>R, seizures Restrictions Weight Bearing Restrictions: No Other Position/Activity Restrictions: on 2-4 L/min O2  Therapy/Group: Individual Therapy  Curtis Sites 06/09/2020, 6:36 AM

## 2020-06-09 NOTE — Progress Notes (Signed)
Occupational Therapy Discharge Summary  Patient Details  Name: [name] MRN: [id number] Date of Birth: Jun 22, 1945  Today's Date: 06/10/2020 OT Individual Time: 1040-1110 OT Individual Time Calculation (min): 30 min    Patient has met 2 of 4 long term goals despite heavy decline leading to a d/c home with hospice per family request. Patient to discharge at overall max-total Assist level for bed level ADLs. He is able to self-feed and complete grooming tasks with mod A when motivated to do so.  Patient's care partner requires assistance to provide the necessary physical and cognitive assistance at discharge.   Reasons goals not met: Pt did not meet his two cognitive goals for improved sustain attention and awarness. Pt has had a cognitive decline and will d/c home with hospice.   Recommendation:  Patient will benefit from ongoing skilled OT services in home health setting to continue to advance functional skills in the area of Reduce care partner burden.  Equipment: No equipment provided- hospice will provide   Reasons for discharge: change in medical status, lack of progress toward goals and discharge from hospital  Patient/family agrees with progress made and goals achieved: Yes   Skilled OT Intervention: Pt received supine with his wife present. Extensive discussion re home d/c with hospice support. Coordinated care with CSW to ensure team all on the same page/giving same information re how much care to anticipate. Pt reported need to have a BM. Attempted to place bedpan but pt was unable to tolerate this for even several seconds. Pt reported pain with all movement but it alleviated with rest breaks. Pt rolled R with mod A once given time to initiate and HOH for facilitation of grasp on bed rail. Total A required for clean up of incontinent BM and donning of new brief. Pt was left supine with all needs met, wife present.    OT Discharge Precautions/Restrictions   Precautions Precautions: Fall Precaution Comments: Peripheral visual field cut L>R, seizures Restrictions Weight Bearing Restrictions: No ADL ADL Eating: Moderate assistance Where Assessed-Eating: Bed level Grooming: Minimal assistance Where Assessed-Grooming: Bed level Upper Body Bathing: Maximal assistance,Maximal cueing Where Assessed-Upper Body Bathing: Bed level Lower Body Bathing: Maximal assistance,Maximal cueing Where Assessed-Lower Body Bathing: Bed level Upper Body Dressing: Maximal assistance,Moderate cueing Where Assessed-Upper Body Dressing: Wheelchair Lower Body Dressing: Dependent Where Assessed-Lower Body Dressing: Bed level Toileting: Dependent Where Assessed-Toileting: Bed level Toilet Transfer: Other (comment) (+3 stedy or hoyer) Toilet Transfer Method: Unable to assess Science writer: Engineer, technical sales Transfer: Unable to assess Tub/Shower Transfer Method: Unable to assess Vision Baseline Vision/History: Wears glasses Wears Glasses: At all times Patient Visual Report: Blurring of vision Vision Assessment?: Yes Eye Alignment: Within Functional Limits Alignment/Gaze Preference: Within Defined Limits Tracking/Visual Pursuits: Requires cues, head turns, or add eye shifts to track;Unable to hold eye position out of midline Saccades: Additional eye shifts occurred during testing;Decreased speed of saccadic movement Convergence: Within functional limits Additional Comments: Peripheral field cuts Perception  Perception: Impaired Inattention/Neglect: Does not attend to left visual field Praxis Praxis: Impaired Praxis Impairment Details: Initiation;Motor planning;Perseveration Cognition Overall Cognitive Status: Impaired/Different from baseline Arousal/Alertness: Lethargic Orientation Level: Oriented to person Attention: Focused Focused Attention: Impaired Focused Attention Impairment: Verbal basic;Functional basic Memory:  Impaired Memory Impairment: Decreased short term memory;Decreased recall of new information Decreased Short Term Memory: Verbal basic;Functional basic Awareness: Impaired Awareness Impairment: Intellectual impairment Problem Solving: Impaired Problem Solving Impairment: Verbal basic;Functional basic Executive Function:  (all impaired) Safety/Judgment: Impaired Sensation Sensation Light Touch: Appears  Intact Coordination Gross Motor Movements are Fluid and Coordinated: No Fine Motor Movements are Fluid and Coordinated: No Coordination and Movement Description: generalized weakness, deficits in motor planning, and visualmotor deficits that impact coordination Motor  Motor Motor: Other (comment);Abnormal postural alignment and control;Hemiplegia Motor - Discharge Observations: Generalized weakness, further decline, L >R Mobility  Bed Mobility Bed Mobility: Rolling Right;Rolling Left;Supine to Sit;Sit to Supine Rolling Right: Moderate Assistance - Patient 50-74% (at his best, often needing as much as max +2) Rolling Left: Moderate Assistance - Patient 50-74% Supine to Sit: 2 Helpers Sit to Supine: 2 Helpers Transfers Sit to Stand: Dependent - mechanical lift Stand to Sit: Dependent - mechanical lift  Trunk/Postural Assessment  Cervical Assessment Cervical Assessment: Exceptions to [facility] (forward head) Thoracic Assessment Thoracic Assessment: Exceptions to [facility] (rounded shoulders, kyphotic posture, and L lean) Lumbar Assessment Lumbar Assessment: Exceptions to [facility] (posterior pelvic tilt) Postural Control Postural Control: Deficits on evaluation (limited, inadequate)  Balance Balance Balance Assessed: Yes Static Sitting Balance Static Sitting - Balance Support: Feet supported;Bilateral upper extremity supported Static Sitting - Level of Assistance: 2: Max assist Dynamic Sitting Balance Dynamic Sitting - Balance Support: Feet supported;Bilateral upper extremity supported Dynamic  Sitting - Level of Assistance: 2: Max assist (can fluctuate as little as min A to max A) Static Standing Balance Static Standing - Level of Assistance: 1: +2 Total assist Extremity/Trunk Assessment RUE Assessment RUE Assessment: Exceptions to [facility] General Strength Comments: Pt often has difficulty motor planning functional reaching, but has active shoulder flexion to 90 degrees and able to feed himself when motivated to do so LUE Assessment LUE Assessment: Exceptions to [facility] General Strength Comments: Pt often has difficulty motor planning functional reaching, but has active shoulder flexion to 90 degrees and able to feed himself when motivated to do so   Curtis Sites 06/09/2020, 2:47 PM

## 2020-06-09 NOTE — Progress Notes (Signed)
Speech Language Pathology Daily Session Note  Patient Details  Name: [name] MRN: [id number] Date of Birth: [birth year]  Today's Date: 06/09/2020 SLP Individual Time: 1000-1030 SLP Individual Time Calculation (min): 30 min  Short Term Goals: Week 4: SLP Short Term Goal 1 (Week 4): STGs=LTGs due to ELOS  Skilled Therapeutic Interventions: Pt was seen for skilled ST targeting completion of education with pt and his family. Upon arrival, pt also noted to have trace residue of what appeared to be eggs in oral cavity, therefore oral care via suction brush provided. Pt still with decreased awareness of deficits, as he stated he could hold toothbrush and assist in other ways with oral care, however he really required Total A for this task. Discussed importance of oral care with family. Pt's family also had follow up questions about current diet recommendation for Dys 2 solids. SLP provided verbal instruction for how to achieve Dys 2 texture via "pulse" feature on a food processor and discussed importance of moisture/use of condiments such as gravy to moisten dry meats. All questions were answered to family's satisfaction. Pt left semi-reclined in bed with needs within reach and family still present. Continue per current plan of care.      Pain Pain Assessment Pain Scale: Faces Pain Score: 0-No pain Faces Pain Scale: No hurt  Therapy/Group: Individual Therapy  Arbutus Leas 06/09/2020, 7:26 AM

## 2020-06-09 NOTE — Plan of Care (Signed)
  Problem: Consults Goal: RH GENERAL PATIENT EDUCATION Description: See Patient Education module for education specifics. Outcome: Progressing Goal: Skin Care Protocol Initiated - if Braden Score 18 or less Description: If consults are not indicated, leave blank or document N/A Outcome: Progressing   Problem: RH BOWEL ELIMINATION Goal: RH STG MANAGE BOWEL WITH ASSISTANCE Description: STG Manage Bowel with min-mod Assistance. Outcome: Progressing   Problem: RH BLADDER ELIMINATION Goal: RH STG MANAGE BLADDER WITH ASSISTANCE Description: STG Manage Bladder With min-mod Assistance Outcome: Progressing   Problem: RH SKIN INTEGRITY Goal: RH STG SKIN FREE OF INFECTION/BREAKDOWN Description: Skin to remain free from infection, breakdown, and/or tears with min-mod assistance. Outcome: Progressing   Problem: RH SAFETY Goal: RH STG ADHERE TO SAFETY PRECAUTIONS W/ASSISTANCE/DEVICE Description: STG Adhere to Safety Precautions With min-mod Assistance/Device. Outcome: Progressing

## 2020-06-09 NOTE — Progress Notes (Signed)
Physical Therapy Session Note  Patient Details  Name: [name] MRN: [id number] Date of Birth: [birth year]  Today's Date: 06/09/2020 PT Individual Time: 1300-1425 PT Individual Time Calculation (min): 85 min   Short Term Goals: Week 4:  PT Short Term Goal 1 (Week 4): STG=LTG due to ELOS.  Skilled Therapeutic Interventions/Progress Updates:     Patient in bed with his wife and 2 sons present for family education upon PT arrival. Patient alert and agreeable to PT session. Patient reported un-rated sacral pain intermittently with mobility during session, RN made aware. PT provided repositioning, rest breaks, and distraction as pain interventions throughout session.   Focused family education on demonstration of parts and operation of Hoyer lift, unable to use lift with patient due to bed/lift incompatibility with air mattress and patient with frequent bowl incontinence during session. Also focused on d/c planning and general education on home hospice care, encouraged follow-up with CSW and hospice care coordinator. Recommending family wait to perform hoyer lift transfers until home health therapist is able to assess patient's sitting balance and tolerance in provided w/c or recliner and compatibility of the lift with the bed/chairs. Educated on need for 2 person assist for bed mobility, positioning, and lift transfers. Patient's son's stated that they do not intend on assisting with bathing or toileting, but can assist with mobility, however, remained hands off during session due to patient's frequent bowl incontinence. Discussed family's status on obtaining additional support for patient's care. Patient's wife and son have been looking into a home health aid, but have not yet selected an agency to interview with. No additional assistance secured for patient's care at this time, CSW made aware. Reinforced that patient will need +2 assist for all mobility and the importance of securing a home health  aid prior to d/c, patient's son and wife stated understanding, however, also voiced concerns about achieving this by d/c on 2/22.   Therapeutic Activity: Bed Mobility: Patient was incontinent of bladder x2 and incontinent of bowl x2 during session, patient also requested the bed pan for false BM x2. Performed rolling R/L with mod-max A +2 with hand over hand assist for R hand placement on bed rail. Performed peri-care and donning/doffing of incontinence brief with total A +2. Patient's wife and PT provided assist with all mobility and toileting needs. Provided cues for thorough peri-care and bed pan placement. RN assisted and changed patient's dressing due to soiling x1. Attempted supine>sit x1, however, limited by patient asking for the bed pan. Reviewed hoyer sling placement during rolling, family will need further instruction due to interruption of education by patient with bowl incontinence.   Required significant time for family discussion of d/c planning and patient's deficits and for incontinence management during session.   Patient in bed with his family at bedside at end of session with breaks locked, bed alarm set, and all needs within reach.    Therapy Documentation Precautions:  Precautions Precautions: Fall Precaution Comments: Peripheral visual field cut L>R, seizures Restrictions Weight Bearing Restrictions: No Other Position/Activity Restrictions: on 2-4 L/min O2   Therapy/Group: Individual Therapy  Evette Diclemente L Ashland Osmer PT, DPT  06/09/2020, 8:32 PM

## 2020-06-09 NOTE — Progress Notes (Signed)
[place] PHYSICAL MEDICINE & REHABILITATION PROGRESS NOTE   Subjective/Complaints: No complaints More alert today  ROS: limited due to cognitive/behavioral    Objective:   No results found. No results for input(s): WBC, HGB, HCT, PLT in the last 72 hours. No results for input(s): NA, K, CL, CO2, GLUCOSE, BUN, CREATININE, CALCIUM in the last 72 hours.  Intake/Output Summary (Last 24 hours) at 06/09/2020 1044 Last data filed at 06/09/2020 5465 Gross per 24 hour  Intake 60 ml  Output -  Net 60 ml     Pressure Injury 05/14/20 Sacrum Mid Unstageable - Full thickness tissue loss in which the base of the injury is covered by slough (yellow, tan, gray, green or brown) and/or eschar (tan, brown or black) in the wound bed. previously documented as stage 2,  (Active)  05/14/20 0132  Location: Sacrum  Location Orientation: Mid  Staging: Unstageable - Full thickness tissue loss in which the base of the injury is covered by slough (yellow, tan, gray, green or brown) and/or eschar (tan, brown or black) in the wound bed.  Wound Description (Comments): previously documented as stage 2, now unsteagable with with soft black tissue covering wound base  Present on Admission: Yes    Physical Exam: Vital Signs Blood pressure (!) 131/96, pulse 83, temperature 98 F (36.7 C), resp. rate 16, height 5\' 11"  (1.803 m), weight 96 kg, SpO2 97 %.  Gen: no distress, normal appearing HEENT: oral mucosa pink and moist, NCAT Cardio: Reg rate Chest: normal effort, normal rate of breathing GI/Abdomen: BS +, non-tender, non-distended Ext: no clubbing, cyanosis, or edema Psych: confused, a little restless today   Skin: Sacral ulcer with dressing in place, multiple small bruises Musc: No edema in extremities.  No tenderness in extremities. Neuro: alert and oriented to person, hospital. Follows basic commands. Speech clear. Motor: RUE/RLE: 4/5 proximal distal Left neglect  LUE/LLE: 3- to 3/5 proximal to  distal. Stable  Assessment/Plan: 1. Functional deficits which require 3+ hours per day of interdisciplinary therapy in a comprehensive inpatient rehab setting.  Physiatrist is providing close team supervision and 24 hour management of active medical problems listed below.  Physiatrist and rehab team continue to assess barriers to discharge/monitor patient progress toward functional and medical goals  Care Tool:  Bathing    Body parts bathed by patient: Right arm,Left arm,Chest,Abdomen,Face   Body parts bathed by helper: Front perineal area,Buttocks,Right upper leg,Left upper leg,Right lower leg,Left lower leg     Bathing assist Assist Level: Maximal Assistance - Patient 24 - 49%     Upper Body Dressing/Undressing Upper body dressing   What is the patient wearing?: Pull over shirt    Upper body assist Assist Level: Maximal Assistance - Patient 25 - 49%    Lower Body Dressing/Undressing Lower body dressing      What is the patient wearing?: Incontinence brief,Pants     Lower body assist Assist for lower body dressing: Dependent - Patient 0%     Toileting Toileting Toileting Activity did not occur (Clothing management and hygiene only): N/A (no void or bm)  Toileting assist Assist for toileting: 2 Helpers     Transfers Chair/bed transfer  Transfers assist  Chair/bed transfer activity did not occur: Safety/medical concerns (decreased strength/activity tolerance)  Chair/bed transfer assist level: Dependent - mechanical lift     Locomotion Ambulation   Ambulation assist   Ambulation activity did not occur: Safety/medical concerns (decreased strength/activity tolerance)          Walk  10 feet activity   Assist  Walk 10 feet activity did not occur: Safety/medical concerns        Walk 50 feet activity   Assist Walk 50 feet with 2 turns activity did not occur: Safety/medical concerns         Walk 150 feet activity   Assist Walk 150 feet  activity did not occur: Safety/medical concerns         Walk 10 feet on uneven surface  activity   Assist Walk 10 feet on uneven surfaces activity did not occur: Safety/medical concerns         Wheelchair     Assist Will patient use wheelchair at discharge?: Yes (Per PT long term goals)   Wheelchair activity did not occur: Safety/medical concerns (decreased strength/activity tolerance to transfer to w/c on eval)         Wheelchair 50 feet with 2 turns activity    Assist    Wheelchair 50 feet with 2 turns activity did not occur: Safety/medical concerns       Wheelchair 150 feet activity     Assist  Wheelchair 150 feet activity did not occur: Safety/medical concerns       Blood pressure (!) 131/96, pulse 83, temperature 98 F (36.7 C), resp. rate 16, height 5\' 11"  (1.803 m), weight 96 kg, SpO2 97 %.  Medical Problem List and Plan: 1.Functional and mobility deficitssecondary to debility after recurrent seizures and associated respiratory failure. Resolving encephalopathy.  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 Team/family working on finalizing plans for discharge to home  06/11/20 2. Antithrombotics: -DVT/anticoagulation:Pharmaceutical: Continue Heparin.  -antiplatelet therapy: N/a 3. Pain Management:Tylenol prn  -turning pt in bed/air mattress  -continue oxycodone for severe pain 4. Mood/agitation: -antipsychotic agents: Continue low dose seroquel to help with sundowning, 25mg  qhs   -limit benzos  -continue pyridoxine 5. Neuropsych: This patientis not fullycapable of making decisions onhisown behalf. 6. Skin/Wound Care:Routine pressure relief measures, turning in bed  -continue foam dressing to sacrum -prevalon boots for bilateral heels -encourage PO intake 7. Fluids/Electrolytes/Nutrition:   -low protein  state   -offering protein supps   -encouraging fluids 8. HTN: Monitor BP tid  Diastolic BP elevated, continue Norvasc and metoprolol 9. Steroid induced hyperglycemia: Monitor BS ac/hs and use SSI for elevated BS.  amaryl 1mg  daily 1/25, increased to 2 mg on 2/3  2/18 sugars are somewhat labile. Will not change amaryl 4mg  given hospice plans 10. GBM/Seizures: Has been seizure free on Keppra 2000 mg bid.  -continue decadron 4mg  bid as above  -see #5  No breakthrough seizures   12. Hypoxia: Currently on 2 L per [place]   Continue supplemental oxygen per [place].will go home on O2 Encourage IS, deep breathing, OOB as possible 13.  Acute lower UTI  10 days amoxil completed, urine clear, straw colored 14.  Dysphagia  Diet adjusted to D2/nectars d/t increased aspiration risk per SLP 15. Thrombocytopenia: 122k 2/7 near baseline, 112 on 2/14   -will not f/u given hospice status  LOS: 27 days A FACE TO FACE EVALUATION WAS PERFORMED  Clide Deutscher Dirck Butch 06/09/2020, 10:44 AM

## 2020-06-10 LAB — CBC
HCT: 33.2 % — ABNORMAL LOW (ref 39.0–52.0)
Hemoglobin: 11.7 g/dL — ABNORMAL LOW (ref 13.0–17.0)
MCH: 31.3 pg (ref 26.0–34.0)
MCHC: 35.2 g/dL (ref 30.0–36.0)
MCV: 88.8 fL (ref 80.0–100.0)
Platelets: 113 10*3/uL — ABNORMAL LOW (ref 150–400)
RBC: 3.74 MIL/uL — ABNORMAL LOW (ref 4.22–5.81)
RDW: 16.6 % — ABNORMAL HIGH (ref 11.5–15.5)
WBC: 3.7 10*3/uL — ABNORMAL LOW (ref 4.0–10.5)
nRBC: 0.5 % — ABNORMAL HIGH (ref 0.0–0.2)

## 2020-06-10 LAB — GLUCOSE, CAPILLARY
Glucose-Capillary: 143 mg/dL — ABNORMAL HIGH (ref 70–99)
Glucose-Capillary: 128 mg/dL — ABNORMAL HIGH (ref 70–99)
Glucose-Capillary: 112 mg/dL — ABNORMAL HIGH (ref 70–99)
Glucose-Capillary: 134 mg/dL — ABNORMAL HIGH (ref 70–99)

## 2020-06-10 LAB — BASIC METABOLIC PANEL
Anion gap: 8 (ref 5–15)
BUN: 25 mg/dL — ABNORMAL HIGH (ref 8–23)
CO2: 30 mmol/L (ref 22–32)
Calcium: 9 mg/dL (ref 8.9–10.3)
Chloride: 98 mmol/L (ref 98–111)
Creatinine, Ser: 0.59 mg/dL — ABNORMAL LOW (ref 0.61–1.24)
GFR, Estimated: >60 mL/min (ref >60)
Glucose, Bld: 155 mg/dL — ABNORMAL HIGH (ref 70–99)
Potassium: 3.9 mmol/L (ref 3.5–5.1)
Sodium: 136 mmol/L (ref 135–145)

## 2020-06-10 NOTE — Progress Notes (Signed)
Physical Therapy Discharge Summary  Patient Details  Name: [name] MRN: [id number] Date of Birth: 1946-03-24  Today's Date: 06/10/2020   Patient has met 1 of 8 long term goals due to patient with functional decline throughout stay. Patient's family able to provide ROM and positioning bed level and planning on hiring a HHA for toileting and bathing assistance as patient requires +2 A bed level. Unable to perform hoyer lift training due to patient with bowl incontinence and concerns of patient safety without known seating options, as patient currently requires a TIS w/c for sitting tolerance and trunk control at this time.  Patient to discharge at bed level max A +2.   Patient's care partner requires assistance to provide the necessary physical and cognitive assistance at discharge. Plan to discharge with Home [facility]osp[facility] for additional care and services.   Reasons goals not met: Patient with consistent, progressive functional decline during admission. Change in POC to d/c home with Hospice. Family hired a home health aid to assist for +2 patient care. Unable to assess safe seating option for patient, recommending no transfers at this time and continued practice with [facility] lift if transfers become appropriate.   Recommendation:  Patient will benefit from ongoing skilled PT services in home health setting with Home Hospice to continue to advance safe functional mobility, address ongoing impairments in pain, functional mobility, hoyer lift training for family, sitting tolerance, trunk control, midline orientation, patient/caregiver education, and minimize fall risk.  Equipment: No equipment provided, to be provided by Hospice  Reasons for discharge: discharge from hospital  Patient/family agrees with progress made and goals achieved: Yes  PT Discharge Precautions/Restrictions Precautions Precautions: Fall Precaution Comments: Peripheral visual field cut L>R, seizures Restrictions Weight  Bearing Restrictions: No Vision/Perception  Vision - Assessment Eye Alignment: Within Functional Limits Tracking/Visual Pursuits: Unable to hold eye position out of midline;Requires cues, head turns, or add eye shifts to track Saccades: Additional head turns occurred during testing;Additional eye shifts occurred during testing Additional Comments: L visual field cut Perception Perception: Impaired Inattention/Neglect: Does not attend to left visual field;Does not attend to left side of body Praxis Praxis: Impaired Praxis Impairment Details: Initiation;Motor planning;Perseveration  Cognition Overall Cognitive Status: Impaired/Different from baseline Arousal/Alertness: Lethargic Attention: Focused Focused Attention: Impaired Focused Attention Impairment: Verbal basic;Functional basic Sustained Attention Impairment: Verbal basic;Functional basic Memory: Impaired Memory Impairment: Decreased short term memory;Decreased recall of new information Decreased Short Term Memory: Verbal basic;Functional basic Awareness: Impaired Awareness Impairment: Intellectual impairment Problem Solving: Impaired Problem Solving Impairment: Verbal basic;Functional basic Executive Function:  (all impaired) Behaviors: Verbal agitation;Poor frustration tolerance Safety/Judgment: Impaired Sensation Sensation Light Touch: Appears Intact Coordination Gross Motor Movements are Fluid and Coordinated: No Fine Motor Movements are Fluid and Coordinated: No Coordination and Movement Description: generalized weakness, deficits in motor planning, and visualmotor deficits that impact coordination Motor  Motor Motor: Other (comment);Abnormal postural alignment and control;Hemiplegia Motor - Discharge Observations: Generalized weakness, further decline, L >R  Mobility Bed Mobility Bed Mobility: Rolling Right;Rolling Left;Supine to Sit;Sit to Supine Rolling Right: Moderate Assistance - Patient 50-74% (at his best,  often needing as much as max +2) Rolling Left: Moderate Assistance - Patient 50-74% Supine to Sit: 2 Helpers;Maximal Assistance - Patient - Patient 25-49% Sit to Supine: 2 Helpers;Maximal Assistance - Patient 25-49% Transfers Transfers: Sit to Stand;Stand to Sit Sit to Stand: Dependent - mechanical lift (Sara Plus) Stand to Sit: Dependent - mechanical lift (Sara Plus) Transfer via Financial trader: Stedy;Maximove;[place] Dec Engineer, petroleum Ambulation: No Gait  Gait: No Stairs / Additional Locomotion Stairs: No Wheelchair Mobility Wheelchair Mobility: No (Patient lacking necessary trunk support to sit in manual w/c)  Trunk/Postural Assessment  Cervical Assessment Cervical Assessment: Exceptions to [facility] (forward head) Thoracic Assessment Thoracic Assessment: Exceptions to [facility] (rounded shoulders, kyphotic posture, and L lean) Lumbar Assessment Lumbar Assessment: Exceptions to [facility] (posterior pelvic tilt) Postural Control Postural Control: Deficits on evaluation (limited, inadequate)  Balance Balance Balance Assessed: Yes Static Sitting Balance Static Sitting - Balance Support: Feet supported;Bilateral upper extremity supported Static Sitting - Level of Assistance: 3: Mod assist Dynamic Sitting Balance Dynamic Sitting - Balance Support: Feet supported;Bilateral upper extremity supported Dynamic Sitting - Level of Assistance: 2: Max assist (can fluctuate as little as min A to max A) Static Standing Balance Static Standing - Level of Assistance: Other (comment) (dependent in Sara Plus) Extremity Assessment  RLE Assessment RLE Assessment: Exceptions to [facility] Active Range of Motion (AROM) Comments: Reduced hip flexion in lying and sitting, tight heel cord General Strength Comments: Grossly 1-2/5 throughout bed level, limited by lethargy on d/c assessment LLE Assessment LLE Assessment: Exceptions to [facility] Active Range of Motion (AROM) Comments: Reduced hip flexion in lying and sitting,  tight heel cord General Strength Comments: Grossly 1-2/5 throughout bed level, limited by lethargy on d/c assessment    Olyvia Gopal L Romani Wilbon PT, DPT  06/10/2020, 4:44 PM

## 2020-06-10 NOTE — Progress Notes (Signed)
Patient has been lethargic and only responding to voice this afternoon.  Patient's RR falling to 12 with shallow breathing.  Spouse at bedside and RN explained that patient is still a full code status and we need to have a conversation.  Rapid Response nurse called and explained the process to the wife.  Patient's spouse decided to make him a DNR.  RN called Zella Ball NP for DNR order.  Zella Ball stated, "I will have to read the notes and talk to Dr. Adam Phenix first and call you back."  Zella Ball NP called back to report the DNR has been ordered.

## 2020-06-10 NOTE — Progress Notes (Signed)
PHYSICAL MEDICINE & REHABILITATION PROGRESS NOTE   Subjective/Complaints: Pt in bed. Ongoing sacral pain. Family ed took place this weekend. Family concerns over managing patient's incontinence.   ROS: Limited due to cognitive/behavioral    Objective:   No results found. Recent Labs    06/10/20 0523  WBC 3.7*  HGB 11.7*  HCT 33.2*  PLT 113*   Recent Labs    06/10/20 0523  NA 136  K 3.9  CL 98  CO2 30  GLUCOSE 155*  BUN 25*  CREATININE 0.59*  CALCIUM 9.0    Intake/Output Summary (Last 24 hours) at 06/10/2020 1123 Last data filed at 06/10/2020 1324 Gross per 24 hour  Intake 25 ml  Output --  Net 25 ml     Pressure Injury 05/14/20 Sacrum Mid Unstageable - Full thickness tissue loss in which the base of the injury is covered by slough (yellow, tan, gray, green or brown) and/or eschar (tan, brown or black) in the wound bed. previously documented as stage 2,  (Active)  05/14/20 0132  Location: Sacrum  Location Orientation: Mid  Staging: Unstageable - Full thickness tissue loss in which the base of the injury is covered by slough (yellow, tan, gray, green or brown) and/or eschar (tan, brown or black) in the wound bed.  Wound Description (Comments): previously documented as stage 2, now unsteagable with with soft black tissue covering wound base  Present on Admission: Yes    Physical Exam: Vital Signs Blood pressure (!) 140/105, pulse 99, temperature 97.8 F (36.6 C), temperature source Oral, resp. rate 16, height 5\' 11"  (1.803 m), weight 96 kg, SpO2 95 %.  Constitutional: No distress . Vital signs reviewed. HEENT: EOMI, oral membranes moist Neck: supple Cardiovascular: RRR without murmur. No JVD    Respiratory/Chest: CTA Bilaterally without wheezes or rales. Normal effort    GI/Abdomen: BS +, non-tender, non-distended Ext: no clubbing, cyanosis, or edema Psych: confused but cooperative Skin: Sacral ulcer not visualized today Musc: No edema in  extremities.  No tenderness in extremities. Neuro: alert and oriented to person, hospital. Follows basic commands. Speech clear. Motor: RUE/RLE: 4/5 proximal distal Left neglect  LUE/LLE: 3- to 3/5 proximal to distal. No changes  Assessment/Plan: 1. Functional deficits which require 3+ hours per day of interdisciplinary therapy in a comprehensive inpatient rehab setting.  Physiatrist is providing close team supervision and 24 hour management of active medical problems listed below.  Physiatrist and rehab team continue to assess barriers to discharge/monitor patient progress toward functional and medical goals  Care Tool:  Bathing    Body parts bathed by patient: Right arm,Left arm,Chest,Abdomen,Face   Body parts bathed by helper: Front perineal area,Buttocks,Right upper leg,Left upper leg,Right lower leg,Left lower leg     Bathing assist Assist Level: Maximal Assistance - Patient 24 - 49%     Upper Body Dressing/Undressing Upper body dressing   What is the patient wearing?: Pull over shirt    Upper body assist Assist Level: Maximal Assistance - Patient 25 - 49%    Lower Body Dressing/Undressing Lower body dressing      What is the patient wearing?: Incontinence brief,Pants     Lower body assist Assist for lower body dressing: Dependent - Patient 0%     Toileting Toileting Toileting Activity did not occur (Clothing management and hygiene only): N/A (no void or bm)  Toileting assist Assist for toileting: 2 Helpers     Transfers Chair/bed transfer  Transfers assist  Chair/bed transfer activity did not  occur: Safety/medical concerns (decreased strength/activity tolerance)  Chair/bed transfer assist level: Dependent - mechanical lift     Locomotion Ambulation   Ambulation assist   Ambulation activity did not occur: Safety/medical concerns (decreased strength/activity tolerance)          Walk 10 feet activity   Assist  Walk 10 feet activity did not  occur: Safety/medical concerns        Walk 50 feet activity   Assist Walk 50 feet with 2 turns activity did not occur: Safety/medical concerns         Walk 150 feet activity   Assist Walk 150 feet activity did not occur: Safety/medical concerns         Walk 10 feet on uneven surface  activity   Assist Walk 10 feet on uneven surfaces activity did not occur: Safety/medical concerns         Wheelchair     Assist Will patient use wheelchair at discharge?: Yes (Per PT long term goals)   Wheelchair activity did not occur: Safety/medical concerns (decreased strength/activity tolerance to transfer to w/c on eval)         Wheelchair 50 feet with 2 turns activity    Assist    Wheelchair 50 feet with 2 turns activity did not occur: Safety/medical concerns       Wheelchair 150 feet activity     Assist  Wheelchair 150 feet activity did not occur: Safety/medical concerns       Blood pressure (!) 140/105, pulse 99, temperature 97.8 F (36.6 C), temperature source Oral, resp. rate 16, height 5\' 11"  (1.803 m), weight 96 kg, SpO2 95 %.  Medical Problem List and Plan: 1.Functional and mobility deficitssecondary to debility after recurrent seizures and associated respiratory failure. Resolving encephalopathy.  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 Plan is for discharge to home  06/11/20 2. Antithrombotics: -DVT/anticoagulation:Pharmaceutical: Continue Heparin.  -antiplatelet therapy: N/a 3. Pain Management:Tylenol prn  -turning pt in bed/air mattress  -continue oxycodone for severe pain 4. Mood/agitation: -antipsychotic agents: Continue low dose seroquel to help with sundowning, 25mg  qhs   -limit benzos  -continue pyridoxine 5. Neuropsych: This patientis not fullycapable of making decisions onhisown behalf. 6. Skin/Wound Care:Routine pressure  relief measures, turning in bed  -continue foam dressing to sacrum -prevalon boots for bilateral heels -encourage PO intake 7. Fluids/Electrolytes/Nutrition:   -low protein state   -offering protein supps   -encouraging fluids 8. HTN: Monitor BP tid  Diastolic BP elevated, continue Norvasc and metoprolol 9. Steroid induced hyperglycemia: Monitor BS ac/hs and use SSI for elevated BS.  amaryl 1mg  daily 1/25, increased to 2 mg on 2/3  2/21 sugars are somewhat labile. Will not change amaryl 4mg  given hospice plans 10. GBM/Seizures: Has been seizure free on Keppra 2000 mg bid.  -continue decadron 4mg  bid as above  -see #5  No breakthrough seizures   12. Hypoxia: Currently on 2 L per [place]   Continue supplemental oxygen per [place].will go home on O2 Encourage IS, deep breathing, OOB as possible 13.  Acute lower UTI  10 days amoxil completed, urine clear, straw colored 14.  Dysphagia  Diet adjusted to D2/nectars d/t increased aspiration risk per SLP 15. Thrombocytopenia: 122k 2/7 near baseline, 112 on 2/14   -will not f/u given hospice status  LOS: 28 days A FACE TO FACE EVALUATION WAS PERFORMED  Meredith Staggers 06/10/2020, 11:23 AM

## 2020-06-10 NOTE — Progress Notes (Signed)
Patient ID: [name], male   DOB: 1945-05-24, 75 y.o.   MRN: [id number]   SW followed up with pt wife [name] [phone]) about family education this meeting based on reports received by therapy. Their son [name] present at time of call. While this weekend was challenging, pt will still discharge to home. Wife has an appointment with [facility] homecare agency to discuss hiring an aide. Their son [name] will return to [place] on Thursday, and their son [name] will help PRN but unable to assist with personal care needs. Wife reports she spoke with [name]/[facility] and they intend to visit pt on Wednesday for RN assessment for hospice consult/admissions. SW informed will schedule [facility] ambulance for 9am tomorrow.   SW spoke with [name]/[facility] [phone]) to discuss above. Reports will see if an RN is able to come to complete assessment earlier if possible.   SW scheduled [facility] ambulance pick up for 9am. SW informed medical team.   [name], MSW, [place] Office: [phone] Cell: [phone] Fax: [phone]

## 2020-06-10 NOTE — Progress Notes (Signed)
Physical Therapy Session Note  Patient Details  Name: [name] MRN: [id number] Date of Birth: 12/31/1945  Today's Date: 06/10/2020 PT Individual Time: 1000-1016 PT Individual Time Calculation (min): 16 min   Short Term Goals: Week 4:  PT Short Term Goal 1 (Week 4): STG=LTG due to ELOS.  Skilled Therapeutic Interventions/Progress Updates:     Patient in bed asleep upon PT arrival. Patient easily aroused to verbal stimulation and agreeable to PT session. Patient denied pain during session. Put on music for increased patient engagement in mobility. Patient performed rolling R with mod A with use of bed rail. Set up to assist patient to sitting and patient stated "No" and declined sitting EOB with therapist and asked to be returned to lying. PT assisted with returning the patient to his back with mod A. Patient unable to provide a reason for not sitting up today. Performed scooting up in the bed x1 with total A. Performed B hip/knee flexion/extension x10 with AAROM progressing to PROM as patient fell asleep during activity. Performed B gastroc stretch x1 min. Patient did not arouse during stretching and would only aroused to verbal stimulation with his eyes only open <30 sec. Patient missed 14 min of skilled PT due to fatigue, RN made aware. Will attempt to make-up missed time as able.    Patient in bed at end of session with breaks locked, bed alarm set, and all needs within reach.    Therapy Documentation Precautions:  Precautions Precautions: Fall Precaution Comments: Peripheral visual field cut L>R, seizures Restrictions Weight Bearing Restrictions: No Other Position/Activity Restrictions: on 2-4 L/min O2 General: PT Amount of Missed Time (min): 14 Minutes   Therapy/Group: Individual Therapy  [name] PT, DPT  06/10/2020, 11:59 AM

## 2020-06-10 NOTE — Progress Notes (Signed)
Called to bedside because patient's LOC has decreased today and he is breathing more shallow and slow.  Per staff this is a change today.  He will awake and say "ouch" when repositioned.  Per staff and wife the plan is to go home with hospice.  She states that she does not wish for him to go to the ICU.  I asked her if she would want [place] to try to revive him if his heart stopped or if he stopped breathing.  She stated they would not want [place] to resuscitate him.  RN called MD to communicate wife's wishes.  DNR order received.

## 2020-06-11 LAB — GLUCOSE, CAPILLARY
Glucose-Capillary: 152 mg/dL — ABNORMAL HIGH (ref 70–99)
Glucose-Capillary: 118 mg/dL — ABNORMAL HIGH (ref 70–99)

## 2020-06-11 MED ORDER — AMLODIPINE BESYLATE 10 MG PO TABS: ORAL_TABLET | ORAL | 0 refills | Status: AC

## 2020-06-11 MED ORDER — CLONAZEPAM 0.25 MG PO TBDP: 0.5000 mg | ORAL_TABLET | Freq: Two times a day (BID) | ORAL | Status: DC | PRN

## 2020-06-11 MED ORDER — LEVETIRACETAM 1000 MG PO TABS: 2000.0000 mg | ORAL_TABLET | Freq: Two times a day (BID) | ORAL | 1 refills | Status: AC

## 2020-06-11 MED ORDER — RESOURCE THICKENUP CLEAR PO POWD
ORAL | Status: DC | PRN
  Filled 2020-06-11 (×2): qty 125

## 2020-06-11 MED ORDER — COLLAGENASE 250 UNIT/GM EX OINT: 1.0000 "application " | TOPICAL_OINTMENT | Freq: Every day | CUTANEOUS | 0 refills | Status: AC

## 2020-06-11 MED ORDER — RESOURCE THICKENUP CLEAR PO POWD: 1.0000 g | ORAL | 3 refills | Status: AC | PRN

## 2020-06-11 MED ORDER — METOPROLOL SUCCINATE ER 25 MG PO TB24: ORAL_TABLET | ORAL | 0 refills | Status: AC

## 2020-06-11 MED ORDER — CLONAZEPAM 0.5 MG PO TBDP: 0.5000 mg | ORAL_TABLET | Freq: Two times a day (BID) | ORAL | 0 refills | Status: AC | PRN

## 2020-06-11 MED ORDER — DEXAMETHASONE 4 MG PO TABS: 4.0000 mg | ORAL_TABLET | Freq: Two times a day (BID) | ORAL | 0 refills | Status: AC

## 2020-06-11 MED ORDER — VITAMIN B-6 100 MG PO TABS: 100.0000 mg | ORAL_TABLET | Freq: Every day | ORAL | 0 refills | Status: AC

## 2020-06-11 MED ORDER — QUETIAPINE FUMARATE 25 MG PO TABS: 25.0000 mg | ORAL_TABLET | Freq: Every day | ORAL | 0 refills | Status: AC

## 2020-06-11 MED ORDER — GLIMEPIRIDE 4 MG PO TABS
4.0000 mg | ORAL_TABLET | Freq: Every day | ORAL | 0 refills | Status: AC
Start: 2020-06-12 — End: ?

## 2020-06-11 NOTE — Progress Notes (Signed)
Inpatient Rehabilitation Care Coordinator Discharge Note  The overall goal for the admission was met for:   Discharge location: Yes. Pt d/c to home with 24/7 care from wife. Pt to be assessed for hospice today. No concerns about pt being admitted. Pt wife in process of establishing additional supports with home care agency-[facility].  Length of Stay: Yes. 28 days.   Discharge activity level: Yes. Max A +2  Home/community participation: Yes. Limited.   Services provided included: MD, RD, PT, OT, SLP, RN, CM, TR, Pharmacy, Neuropsych and SW  Financial Services: Medicare and Private Insurance: [place] offered to/list presented to:N/A  Follow-up services arranged: D/c to home with hospice.   Comments (or additional information):  Patient/Family verbalized understanding of follow-up arrangements: Yes  Individual responsible for coordination of the follow-up plan: contact pt wife [name] [phone]  Confirmed correct DME delivered: Rana Snare 06/11/2020    Rana Snare

## 2020-06-11 NOTE — Discharge Instructions (Signed)
Inpatient Rehab Discharge Instructions  [name] Discharge date and time:    Activities/Precautions/ Functional Status: Activity: no lifting, driving, or strenuous exercise for till cleared by MD Diet: soft foods, nectar thick liquids.  Wound Care: none needed    Functional status:  ___ No restrictions     ___ Walk up steps independently _X__ 24/7 supervision/assistance   ___ Walk up steps with assistance ___ Intermittent supervision/assistance  ___ Bathe/dress independently ___ Walk with walker     ___ Bathe/dress with assistance ___ Walk Independently    ___ Shower independently ___ Walk with assistance    ___ Shower with assistance _X__ No alcohol     ___ Return to work/school ________  Special Instructions:    My questions have been answered and I understand these instructions. I will adhere to these goals and the provided educational materials after my discharge from the hospital.  Patient/Caregiver Signature _______________________________ Date __________  Clinician Signature _______________________________________ Date __________  Please bring this form and your medication list with you to all your follow-up doctor's appointments.

## 2020-06-11 NOTE — Progress Notes (Signed)
Pt discharged home with family via EMS. Discharge instructions given by Jeannene Patella, PA. No further questions from pt or family. Pt stable and at baseline at time of DC.   Gerald Stabs, RN

## 2020-06-11 NOTE — Discharge Summary (Signed)
Physician Discharge Summary  Patient ID: [name] MRN: [id number] DOB/AGE: 1946-02-10 75 y.o.  Admit date: 05/13/2020 Discharge date: 06/11/2020  Discharge Diagnoses:  Principal Problem:   Physical debility Active Problems:   Dysphagia   Steroid-induced hyperglycemia   Benign essential HTN   Seizures ([place])   Discharged Condition: stable  Significant Diagnostic Studies: DG Chest 2 View  Result Date: 05/23/2020 CLINICAL DATA:  Congestion. EXAM: CHEST - 2 VIEW COMPARISON:  May 16, 2020. FINDINGS: Stable cardiomediastinal silhouette. Status post coronary bypass graft. No pneumothorax is noted. Mild left basilar atelectasis or infiltrate is noted with small left pleural effusion. Right lung is unremarkable. Bony thorax is unremarkable. IMPRESSION: Mild left basilar atelectasis or infiltrate is noted with small left pleural effusion. Electronically Signed   By: Marijo Conception M.D.   On: 05/23/2020 13:29   DG Chest 2 View  Result Date: 05/16/2020 CLINICAL DATA:  Shortness of breath EXAM: CHEST - 2 VIEW COMPARISON:  May 13, 2020 FINDINGS: Evaluation is limited secondary to inability to optimally position patient. The cardiomediastinal silhouette is unchanged and enlarged in contour.Status post median sternotomy and CABG. Low lung volumes. No pleural effusion. No pneumothorax. Bibasilar hazy opacities, similar in comparison to prior. Visualized abdomen is unremarkable. Multilevel degenerative changes of the thoracic spine. IMPRESSION: Low lung volumes and bibasilar hazy opacities, similar in comparison to prior. Electronically Signed   By: Valentino Saxon MD   On: 05/16/2020 15:35   CT HEAD WO CONTRAST  Result Date: 05/16/2020 CLINICAL DATA:  Mental status change, lethargic, history of glioblastoma EXAM: CT HEAD WITHOUT CONTRAST TECHNIQUE: Contiguous axial images were obtained from the base of the skull through the vertex without intravenous contrast. COMPARISON:  05/06/2020  FINDINGS: Brain: Right occipital mass seen previously is again identified, with evidence of extension to the splenium of the corpus callosum as seen on previous MRI. There is surrounding vasogenic edema, with serpiginous areas of calcification within the right occipital lobe unchanged. Persistent leftward midline shift measuring approximately 8 mm at the level of the septum pellucidum, slightly more pronounced than prior study where it measured 6 mm. There is effacement of the right lateral ventricle. No acute extra-axial fluid collections. No evidence of acute infarct or hemorrhage. Vascular: No hyperdense vessel or unexpected calcification. Skull: Normal. Negative for fracture or focal lesion. Postsurgical changes from previous right occipital craniotomy. Sinuses/Orbits: Gas fluid levels within the sphenoid sinuses are new since prior study. Remaining sinuses are clear. Other: None. IMPRESSION: 1. 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 Slight increase in leftward midline shift, now measuring 8 mm. 2. No acute infarct or hemorrhage. Electronically Signed   By: Randa Ngo M.D.   On: 05/16/2020 18:55    Labs:  Basic Metabolic Panel: BMP Latest Ref Rng & Units 06/10/2020 06/03/2020 05/27/2020  Glucose 70 - 99 mg/dL 155(H) 173(H) 263(H)  BUN 8 - 23 mg/dL 25(H) 18 17  Creatinine 0.61 - 1.24 mg/dL 0.59(L) 0.59(L) 0.59(L)  BUN/Creat Ratio 10 - 24 - - -  Sodium 135 - 145 mmol/L 136 136 136  Potassium 3.5 - 5.1 mmol/L 3.9 4.4 4.2  Chloride 98 - 111 mmol/L 98 99 98  CO2 22 - 32 mmol/L 30 26 30   Calcium 8.9 - 10.3 mg/dL 9.0 9.2 8.5(L)    CBC: CBC Latest Ref Rng & Units 06/10/2020 06/03/2020 05/27/2020  WBC 4.0 - 10.5 K/uL 3.7(L) 4.0 4.1  Hemoglobin 13.0 - 17.0 g/dL 11.7(L) 11.4(L) 11.3(L)  Hematocrit 39.0 - 52.0 % 33.2(L) 33.5(L) 33.7(L)  Platelets 150 - 400 K/uL 113(L) 112(L) 122(L)    CBG: Recent  Labs  Lab 06/10/20 1121 06/10/20 1602 06/10/20 2202 06/11/20 0610 06/11/20 1114  GLUCAP 128* 112* 134* 152* 118*     Brief HPI:   [name] is a 75 y.o. male with history of ASCVD, T2DM, prostate cancer, GBM s/p resection 3/21 and onset of seizures 02/2020,  recent COVID-19 infection 04/17/2020 who was admitted on 05/06/2020 with fall due to recurrent seizure.  Patient had failed chemo and was in process of starting salvage therapy.  CT head done showing residual masslike density right occipital lobe with similar edema and mass-effect compared to November films.  He was loaded with Keppra and started on IV Decadron.  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    He continued to have bouts of agitation and wife reported that patient had had issues since increase in Keppra dose 3-4 weeks PTA.   Dr. Hortense Ramal recommended addition of pyridoxine100 mg for possible Keppra related agitation.  Lethargy had resolved however he continued to be limited by weakness affecting ADLs as well as inability to walk requiring STEDY to stand.  Therapy was ongoing and CIR was recommended due to functional decline.   Hospital Course: [name] was admitted to rehab 05/13/2020 for inpatient therapies to consist of PT, ST and OT at least three hours five days a week. Past admission physiatrist, therapy team and rehab RN have worked together to provide customized collaborative inpatient rehab. He continued to have issues with sundowning and bouts of agitation. Seroquel was added to help with sleep wake disruption. He was also found to have UTI at admission and was treated with 10-day course of amoxicillin.  His blood pressures were monitored on TID basis and were controlled on amlodipine and metoprolol.  Steroid-induced hyperglycemia was monitored with achs BG checks.  Blood sugars were noted to be  labile and Amaryl was added and titrated to 4 mg/day.  His diet was downgraded dysphagia to nectar liquids due to increased aspiration risk. Respiratory status has improved and he has been weaned off oxygen.   Unstageable ulcer on sacrum was treated with santyl and damp to dry dressing. Air mattress overlay was added to help with pressure relief. He has had significant sacral pain and oxycodone prn as well as pressure relief measures were encouraged. Prevalon boots also ordered with protein supplement to promote healing.  His mentation did improve briefly with treatment of UTI however his mentation continued to fluctuate with progressive decline during his stay. Follow up CT head done 01/27 revealing persistent mass effect with slight increase and decadron increased to 4 mg bid.  His wife had discussions with heme-onc about his prognosis and elected on discharge to home with hospice.  He started having bouts of lethargy and wife reported onset of episodes of absence type seizures 24 hours prior to discharge.  She was given prescriptions for subcu Klonopin to be used as needed breakthrough seizures.   Rehab course: During patient's stay in rehab weekly team conferences were held to monitor patient's progress, set goals and discuss barriers to discharge. At admission, patient required max assist with ADL tasks and for mobility. He exhibited moderately severe cognitive impairments with intermittent confusion as well as dysphagia requiring downgrade of diet.  He  continued to decline during his stay and was requiring max to total assist at bed level to complete ADL tasks. He required max assist +2 at bed level and required TIS wheelchair for truncal control and for sitting tolerance.  He was tolerating nectar liquids without s/s of aspiration and needed supervision to adhere to safety precautions. Family to assist with hospice to provide additional care after discharge.  Discharge disposition: 01-Home or Self  Care  Diet: Dysphagia 2, nectar liquids.   Special Instructions: 1. Santyl with damp to dry dressing to sacral decub. Pressure relief measures every couple of hours.    Discharge Instructions    Call MD for:  difficulty breathing, headache or visual disturbances   Complete by: As directed    Call MD for:  persistant nausea and vomiting   Complete by: As directed    Call MD for:  severe uncontrolled pain   Complete by: As directed    Call MD for:  temperature >100.4   Complete by: As directed    Change dressing (specify)   Complete by: As directed    Dressing change: daily to every other day as needed   Diet - low sodium heart healthy   Complete by: As directed    Discharge wound care:   Complete by: As directed    Foam dressing to sacrum   Increase activity slowly   Complete by: As directed      Allergies as of 06/11/2020      Reactions   Other Other (See Comments)   THE PATIENT'S SKIN HAS BECOME VERY THIN- BRUISES AND TEARS VERY EASILY!!   Tape Other (See Comments)   THE PATIENT'S SKIN HAS BECOME VERY THIN- BRUISES AND TEARS VERY EASILY!!   Lisinopril Cough      Medication List    TAKE these medications   acetaminophen 325 MG tablet Commonly known as: TYLENOL Take 1-2 tablets (325-650 mg total) by mouth every 4 (four) hours as needed for mild pain.   amLODipine 10 MG tablet Commonly known as: NORVASC TAKE 1 TABLET(10 MG) BY MOUTH EVERY EVENING What changed:   how much to take  how to take this  when to take this  additional instructions   atorvastatin 80 MG tablet Commonly known as: LIPITOR TAKE 1 TABLET(80 MG) BY MOUTH AT BEDTIME What changed: See the new instructions.   calcium citrate 950 (200 Ca) MG tablet Commonly known as: CALCITRATE - dosed in mg elemental calcium Take 1 tablet (200 mg of elemental calcium total) by mouth daily. What changed: when to take this   clonazePAM 0.5 MG disintegrating tablet--Rx# 60 pills Commonly known as:  KLONOPIN Take 1 tablet (0.5 mg total) by mouth 2 (two) times daily as needed for seizure. Notes to patient: Or for anxiety   collagenase ointment Commonly known as: SANTYL Apply 1 application topically daily. Then cover with wet to dry dressing   dexamethasone 4 MG tablet Commonly known as: DECADRON Take 1 tablet (4 mg total) by mouth every 12 (twelve) hours. What changed:   medication strength  how much to take  when to take this  additional instructions   glimepiride 4 MG tablet Commonly known as: AMARYL Take 1 tablet (4 mg total) by mouth daily with breakfast.   levETIRAcetam 1000 MG tablet Commonly known as: KEPPRA Take 2 tablets (2,000 mg total) by mouth 2 (two) times daily.   metoprolol succinate 25 MG 24 hr tablet Commonly known as: TOPROL-XL TAKE 1/2 TABLET(12.5 MG) BY  MOUTH EVERY EVENING What changed: See the new instructions.   pyridOXINE 100 MG tablet Commonly known as: VITAMIN B-6 Take 1 tablet (100 mg total) by mouth daily.   QUEtiapine 25 MG tablet Commonly known as: SEROQUEL Take 1 tablet (25 mg total) by mouth at bedtime.   Resource ThickenUp Clear Powd Take 1 g by mouth as needed.            Discharge Care Instructions  (From admission, onward)         Start     Ordered   05/30/20 0000  Discharge wound care:       Comments: Foam dressing to sacrum   05/30/20 0951   05/30/20 0000  Change dressing (specify)       Comments: Dressing change: daily to every other day as needed   05/30/20 1657          Follow-up Information    Ventura Sellers, MD. Call.   Specialties: Psychiatry, Neurology, Oncology Why: for appointment Contact information: [place] [id number] [phone]        Meredith Staggers, MD. Call.   Specialty: Physical Medicine and Rehabilitation Why: as needed Contact information: [address] [place] [id number] [phone]        Maurice Small, MD Follow up.    Specialty: Family Medicine Contact information: [place] [address] [phone]               Signed: Bary Leriche 06/14/2020, 9:51 AM

## 2020-06-11 NOTE — Progress Notes (Signed)
Patient ID: [name], male   DOB: [birth year], 76 y.o.   MRN: [id number]  PTAR pick up time moved to 10am. Pt scheduled to have RN with hospice come out to the home at 2:30pm today.   [name], MSW, [place] Office: [phone] Cell: [phone] Fax: [phone]

## 2020-06-11 NOTE — Progress Notes (Signed)
[facility] PHYSICAL MEDICINE & REHABILITATION PROGRESS NOTE   Subjective/Complaints: Wife at bedside. She discussed episodes yesterday where pt stared off into space for several seconds followed by a period of decreased responsiveness. Pt reportedly more lethargic yesterday also. Pt in bed, comfortable this am. Wife feeding him breakfast.  ROS: Limited due to cognitive/behavioral    Objective:   No results found. Recent Labs    06/10/20 0523  WBC 3.7*  HGB 11.7*  HCT 33.2*  PLT 113*   Recent Labs    06/10/20 0523  NA 136  K 3.9  CL 98  CO2 30  GLUCOSE 155*  BUN 25*  CREATININE 0.59*  CALCIUM 9.0    Intake/Output Summary (Last 24 hours) at 06/11/2020 7408 Last data filed at 06/10/2020 1308 Gross per 24 hour  Intake 120 ml  Output --  Net 120 ml     Pressure Injury 05/14/20 Sacrum Mid Unstageable - Full thickness tissue loss in which the base of the injury is covered by slough (yellow, tan, gray, green or brown) and/or eschar (tan, brown or black) in the wound bed. previously documented as stage 2,  (Active)  05/14/20 0132  Location: Sacrum  Location Orientation: Mid  Staging: Unstageable - Full thickness tissue loss in which the base of the injury is covered by slough (yellow, tan, gray, green or brown) and/or eschar (tan, brown or black) in the wound bed.  Wound Description (Comments): previously documented as stage 2, now unsteagable with with soft black tissue covering wound base  Present on Admission: Yes    Physical Exam: Vital Signs Blood pressure (!) 157/106, pulse (!) 104, temperature 98.3 F (36.8 C), temperature source Oral, resp. rate 18, height 5\' 11"  (1.803 m), weight 96 kg, SpO2 97 %.  Constitutional: No distress . Vital signs reviewed. HEENT: EOMI, oral membranes moist Neck: supple Cardiovascular: RRR without murmur. No JVD    Respiratory/Chest: CTA Bilaterally without wheezes or rales. Normal effort    GI/Abdomen: BS +, non-tender,  non-distended Ext: no clubbing, cyanosis, or edema Psych: a little anxious, confused. Skin: Sacral ulcer not visualized today Musc: No edema in extremities.  No tenderness in extremities. Neuro: fairly alert, Follows basic commands. Speech clear. Motor: RUE/RLE: 4/5 proximal distal Left neglect  LUE/LLE: 3- to 3/5 proximal to distal. No changes  Assessment/Plan: 1. Functional deficits which require 3+ hours per day of interdisciplinary therapy in a comprehensive inpatient rehab setting.  Physiatrist is providing close team supervision and 24 hour management of active medical problems listed below.  Physiatrist and rehab team continue to assess barriers to discharge/monitor patient progress toward functional and medical goals  Care Tool:  Bathing    Body parts bathed by patient: Right arm,Left arm,Chest,Abdomen,Face   Body parts bathed by helper: Front perineal area,Buttocks,Right upper leg,Left upper leg,Right lower leg,Left lower leg     Bathing assist Assist Level: Maximal Assistance - Patient 24 - 49%     Upper Body Dressing/Undressing Upper body dressing   What is the patient wearing?: Pull over shirt    Upper body assist Assist Level: Maximal Assistance - Patient 25 - 49%    Lower Body Dressing/Undressing Lower body dressing      What is the patient wearing?: Incontinence brief,Pants     Lower body assist Assist for lower body dressing: Dependent - Patient 0%     Toileting Toileting Toileting Activity did not occur (Clothing management and hygiene only): N/A (no void or bm)  Toileting assist Assist for toileting:  2 Helpers     Transfers Chair/bed transfer  Transfers assist  Chair/bed transfer activity did not occur: Safety/medical concerns (decreased strength/activity tolerance)  Chair/bed transfer assist level: Dependent - mechanical lift     Locomotion Ambulation   Ambulation assist   Ambulation activity did not occur: Safety/medical concerns  (decreased strength/motor planning)          Walk 10 feet activity   Assist  Walk 10 feet activity did not occur: Safety/medical concerns        Walk 50 feet activity   Assist Walk 50 feet with 2 turns activity did not occur: Safety/medical concerns         Walk 150 feet activity   Assist Walk 150 feet activity did not occur: Safety/medical concerns         Walk 10 feet on uneven surface  activity   Assist Walk 10 feet on uneven surfaces activity did not occur: Safety/medical concerns         Wheelchair     Assist Will patient use wheelchair at discharge?: Yes (for sitting OOB and dependent transport only if appropriate)   Wheelchair activity did not occur: N/A         Wheelchair 50 feet with 2 turns activity    Assist    Wheelchair 50 feet with 2 turns activity did not occur: N/A       Wheelchair 150 feet activity     Assist  Wheelchair 150 feet activity did not occur: N/A       Blood pressure (!) 157/106, pulse (!) 104, temperature 98.3 F (36.8 C), temperature source Oral, resp. rate 18, height 5\' 11"  (1.803 m), weight 96 kg, SpO2 97 %.  Medical Problem List and Plan: 1.Functional and mobility deficitssecondary to debility after recurrent seizures and associated respiratory failure. Resolving encephalopathy.  Pt with history of GBM/resection 06/2019  Continue therapies daily only  -home with hospice today 2. Antithrombotics: -DVT/anticoagulation:Pharmaceutical: Continue Heparin.  -antiplatelet therapy: N/a 3. Pain Management:Tylenol prn  -turning pt in bed/air mattress  -continue oxycodone for severe pain 4. Mood/agitation: -antipsychotic agents: Continue low dose seroquel to help with sundowning, 25mg  qhs  -will send home on prn klonopin (disintegrating tablet)     5. Neuropsych: This patientis not fullycapable of making decisions onhisown behalf. 6. Skin/Wound Care:Routine  pressure relief measures, turning in bed  -continue foam dressing to sacrum -prevalon boots for bilateral heels -encourage PO intake 7. Fluids/Electrolytes/Nutrition:   -low protein state   -offering protein supps   -encouraging fluids 8. HTN: Monitor BP tid  Diastolic BP elevated, continue Norvasc and metoprolol 9. Steroid induced hyperglycemia: Monitor BS ac/hs and use SSI for elevated BS.  amaryl 1mg  daily 1/25, increased to 2 mg on 2/3  2/21 sugars are somewhat labile. Will not change amaryl 4mg  given hospice plans 10. GBM/Seizures: Has been seizure free on Keppra 2000 mg bid.  -continue decadron 4mg  bid as above  -?absence sz's--on high dose keppra. Will send home on prn disintegrating klonopin tablets 0.5mg   12. Hypoxia: Currently on 2 L per [place]   Continue supplemental oxygen per .will go home on O2 Encourage IS, deep breathing, OOB as possible 13.  Acute lower UTI  10 days amoxil completed, urine clear, straw colored 14.  Dysphagia  Diet adjusted to D2/nectars d/t increased aspiration risk per SLP 15. Thrombocytopenia: 122k 2/7 near baseline, 112 on 2/14   -will not f/u given hospice status  LOS: 29 days A FACE TO FACE EVALUATION WAS  PERFORMED  Meredith Staggers 06/11/2020, 9:23 AM

## 2020-06-14 ENCOUNTER — Inpatient Hospital Stay: Payer: Medicare Other | Attending: Internal Medicine | Admitting: Internal Medicine

## 2020-06-14 DIAGNOSIS — C719 Malignant neoplasm of brain, unspecified: Secondary | ICD-10-CM | POA: Diagnosis not present

## 2020-06-15 NOTE — Progress Notes (Signed)
I connected with [name] on 06/15/20 at 11:30 AM EST by telephone visit and verified that I am speaking with the correct person using two identifiers.  I discussed the limitations, risks, security and privacy concerns of performing an evaluation and management service by telemedicine and the availability of in-person appointments. I also discussed with the patient that there may be a patient responsible charge related to this service. The patient expressed understanding and agreed to proceed.  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  He is experiencing decline in arousal and is becoming less interactive.  Still having some nutritional intake.  He does not appear to be in any discomfort.   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  It doesn't appear [name] is likely to live more than a few days or so.  We are supportive of efforts by the hospice team, and are available at any time to home providers or caretakers for guidance or instruction.  Mark Sellers, MD  I discussed the assessment and treatment plan with the patient.  The patient was provided an opportunity to ask questions and all were answered.  The patient agreed with the plan and demonstrated understanding of the instructions.    The patient was advised to call back or seek an in-person evaluation if the symptoms worsen or if the condition fails to improve as anticipated.  I provided 5-10 minutes of non-face-to-face time  during this enocunter.  Mark Sellers, MD   I provided 15 minutes of non face-to-face telephone visit time during this encounter, and > 50% was spent counseling as documented under my assessment & plan.

## 2020-06-18 DEATH — deceased

## 2020-07-12 ENCOUNTER — Other Ambulatory Visit (HOSPITAL_COMMUNITY): Payer: Self-pay

## 2020-10-04 ENCOUNTER — Ambulatory Visit: Payer: Medicare Other | Admitting: Cardiology
